# Patient Record
Sex: Female | Born: 1950 | Race: White | Hispanic: No | Marital: Married | Smoking: Current every day smoker
Health system: Southern US, Community
[De-identification: ages and names within clinical notes are randomized; demographics above are authoritative.]

## PROBLEM LIST (undated history)

## (undated) DIAGNOSIS — F419 Anxiety disorder, unspecified: Secondary | ICD-10-CM

## (undated) DIAGNOSIS — R413 Other amnesia: Secondary | ICD-10-CM

## (undated) DIAGNOSIS — I739 Peripheral vascular disease, unspecified: Secondary | ICD-10-CM

## (undated) DIAGNOSIS — E785 Hyperlipidemia, unspecified: Secondary | ICD-10-CM

## (undated) DIAGNOSIS — I6529 Occlusion and stenosis of unspecified carotid artery: Secondary | ICD-10-CM

## (undated) DIAGNOSIS — F319 Bipolar disorder, unspecified: Secondary | ICD-10-CM

## (undated) DIAGNOSIS — I350 Nonrheumatic aortic (valve) stenosis: Secondary | ICD-10-CM

## (undated) DIAGNOSIS — I5030 Unspecified diastolic (congestive) heart failure: Secondary | ICD-10-CM

## (undated) DIAGNOSIS — I2510 Atherosclerotic heart disease of native coronary artery without angina pectoris: Secondary | ICD-10-CM

## (undated) DIAGNOSIS — J9691 Respiratory failure, unspecified with hypoxia: Secondary | ICD-10-CM

## (undated) DIAGNOSIS — Z952 Presence of prosthetic heart valve: Secondary | ICD-10-CM

## (undated) DIAGNOSIS — K551 Chronic vascular disorders of intestine: Secondary | ICD-10-CM

## (undated) DIAGNOSIS — F329 Major depressive disorder, single episode, unspecified: Secondary | ICD-10-CM

## (undated) DIAGNOSIS — K219 Gastro-esophageal reflux disease without esophagitis: Secondary | ICD-10-CM

## (undated) DIAGNOSIS — C801 Malignant (primary) neoplasm, unspecified: Secondary | ICD-10-CM

## (undated) DIAGNOSIS — E119 Type 2 diabetes mellitus without complications: Secondary | ICD-10-CM

## (undated) DIAGNOSIS — I10 Essential (primary) hypertension: Secondary | ICD-10-CM

## (undated) DIAGNOSIS — Z7902 Long term (current) use of antithrombotics/antiplatelets: Secondary | ICD-10-CM

## (undated) DIAGNOSIS — F209 Schizophrenia, unspecified: Secondary | ICD-10-CM

## (undated) DIAGNOSIS — J449 Chronic obstructive pulmonary disease, unspecified: Secondary | ICD-10-CM

## (undated) DIAGNOSIS — M542 Cervicalgia: Secondary | ICD-10-CM

## (undated) DIAGNOSIS — C4491 Basal cell carcinoma of skin, unspecified: Secondary | ICD-10-CM

## (undated) DIAGNOSIS — F32A Depression, unspecified: Secondary | ICD-10-CM

## (undated) DIAGNOSIS — E1142 Type 2 diabetes mellitus with diabetic polyneuropathy: Secondary | ICD-10-CM

## (undated) DIAGNOSIS — I700 Atherosclerosis of aorta: Secondary | ICD-10-CM

## (undated) DIAGNOSIS — I779 Disorder of arteries and arterioles, unspecified: Secondary | ICD-10-CM

## (undated) DIAGNOSIS — I351 Nonrheumatic aortic (valve) insufficiency: Secondary | ICD-10-CM

## (undated) DIAGNOSIS — Z794 Long term (current) use of insulin: Secondary | ICD-10-CM

## (undated) DIAGNOSIS — R42 Dizziness and giddiness: Secondary | ICD-10-CM

## (undated) DIAGNOSIS — I209 Angina pectoris, unspecified: Secondary | ICD-10-CM

## (undated) DIAGNOSIS — M5030 Other cervical disc degeneration, unspecified cervical region: Secondary | ICD-10-CM

## (undated) HISTORY — DX: Atherosclerotic heart disease of native coronary artery without angina pectoris: I25.10

## (undated) HISTORY — PX: BACK SURGERY: SHX140

## (undated) HISTORY — PX: HEMORRHOID SURGERY: SHX153

## (undated) HISTORY — PX: ABDOMINAL HYSTERECTOMY: SHX81

## (undated) HISTORY — PX: FOOT SURGERY: SHX648

## (undated) HISTORY — DX: Schizophrenia, unspecified: F20.9

## (undated) HISTORY — PX: CARDIAC VALVE REPLACEMENT: SHX585

## (undated) HISTORY — DX: Bipolar disorder, unspecified: F31.9

## (undated) HISTORY — PX: CATARACT EXTRACTION, BILATERAL: SHX1313

## (undated) HISTORY — PX: TUBAL LIGATION: SHX77

## (undated) HISTORY — PX: BLADDER SURGERY: SHX569

## (undated) HISTORY — DX: Occlusion and stenosis of unspecified carotid artery: I65.29

## (undated) HISTORY — PX: CHOLECYSTECTOMY: SHX55

## (undated) HISTORY — DX: Nonrheumatic aortic (valve) insufficiency: I35.1

## (undated) HISTORY — DX: Presence of prosthetic heart valve: Z95.2

## (undated) HISTORY — PX: COLONOSCOPY: SHX174

## (undated) HISTORY — DX: Type 2 diabetes mellitus without complications: E11.9

---

## 1990-08-02 DIAGNOSIS — E119 Type 2 diabetes mellitus without complications: Secondary | ICD-10-CM | POA: Insufficient documentation

## 1999-01-04 ENCOUNTER — Other Ambulatory Visit: Admission: RE | Admit: 1999-01-04 | Discharge: 1999-01-04 | Payer: Self-pay | Admitting: Internal Medicine

## 2000-01-24 ENCOUNTER — Encounter: Admission: RE | Admit: 2000-01-24 | Discharge: 2000-01-31 | Payer: Self-pay | Admitting: Family Medicine

## 2006-10-08 ENCOUNTER — Emergency Department: Payer: Self-pay | Admitting: Internal Medicine

## 2007-11-16 ENCOUNTER — Emergency Department: Payer: Self-pay | Admitting: Emergency Medicine

## 2007-11-16 ENCOUNTER — Other Ambulatory Visit: Payer: Self-pay

## 2007-11-23 ENCOUNTER — Ambulatory Visit: Payer: Self-pay | Admitting: Emergency Medicine

## 2008-05-16 ENCOUNTER — Encounter: Admission: RE | Admit: 2008-05-16 | Discharge: 2008-05-16 | Payer: Self-pay | Admitting: Internal Medicine

## 2008-09-22 ENCOUNTER — Ambulatory Visit: Payer: Self-pay | Admitting: Family Medicine

## 2010-11-23 ENCOUNTER — Emergency Department: Payer: Self-pay | Admitting: Emergency Medicine

## 2012-01-30 ENCOUNTER — Encounter (HOSPITAL_COMMUNITY): Payer: Self-pay | Admitting: *Deleted

## 2012-01-30 ENCOUNTER — Emergency Department (HOSPITAL_COMMUNITY): Admission: EM | Admit: 2012-01-30 | Payer: Medicare Other | Source: Home / Self Care

## 2012-01-30 HISTORY — DX: Anxiety disorder, unspecified: F41.9

## 2012-01-30 HISTORY — DX: Dizziness and giddiness: R42

## 2012-01-30 HISTORY — DX: Major depressive disorder, single episode, unspecified: F32.9

## 2012-01-30 HISTORY — DX: Depression, unspecified: F32.A

## 2012-01-30 LAB — GLUCOSE, CAPILLARY: Glucose-Capillary: 110 mg/dL — ABNORMAL HIGH (ref 70–99)

## 2012-01-30 NOTE — ED Notes (Signed)
Pt states she passed out four times yesterday. States everything is still spinning today. Pt states she has hx of vertigo and went to PMD office this morning and was told to come to ED due to low blood pressure. Denies pain at this time.

## 2012-02-14 DIAGNOSIS — I10 Essential (primary) hypertension: Secondary | ICD-10-CM

## 2012-02-24 ENCOUNTER — Ambulatory Visit: Payer: Self-pay

## 2012-03-04 ENCOUNTER — Ambulatory Visit: Payer: Self-pay | Admitting: General Surgery

## 2012-03-09 ENCOUNTER — Ambulatory Visit: Payer: Self-pay | Admitting: General Surgery

## 2012-03-11 LAB — PATHOLOGY REPORT

## 2012-03-15 ENCOUNTER — Ambulatory Visit: Payer: Self-pay | Admitting: Anesthesiology

## 2012-03-18 ENCOUNTER — Ambulatory Visit: Payer: Self-pay | Admitting: General Surgery

## 2012-03-19 LAB — PATHOLOGY REPORT

## 2012-06-20 ENCOUNTER — Emergency Department: Payer: Self-pay | Admitting: *Deleted

## 2012-06-20 LAB — URINALYSIS, COMPLETE
Bacteria: NONE SEEN
Bilirubin,UR: NEGATIVE
Blood: NEGATIVE
Glucose,UR: >=500 mg/dL (ref 0–75)
Hyaline Cast: 1
Ketone: NEGATIVE
Leukocyte Esterase: NEGATIVE
Nitrite: NEGATIVE
Ph: 6 (ref 4.5–8.0)
Protein: NEGATIVE
RBC,UR: 1 /HPF (ref 0–5)
Specific Gravity: 1.022 (ref 1.003–1.030)
Squamous Epithelial: 1
WBC UR: 1 /HPF (ref 0–5)

## 2012-06-20 LAB — COMPREHENSIVE METABOLIC PANEL
Albumin: 3.5 g/dL (ref 3.4–5.0)
Alkaline Phosphatase: 127 U/L (ref 50–136)
Anion Gap: 8 (ref 7–16)
BUN: 20 mg/dL — ABNORMAL HIGH (ref 7–18)
Bilirubin,Total: 0.3 mg/dL (ref 0.2–1.0)
Calcium, Total: 8.8 mg/dL (ref 8.5–10.1)
Chloride: 100 mmol/L (ref 98–107)
Co2: 27 mmol/L (ref 21–32)
Creatinine: 0.6 mg/dL (ref 0.60–1.30)
EGFR (African American): >60
EGFR (Non-African Amer.): >60
Glucose: 250 mg/dL — ABNORMAL HIGH (ref 65–99)
Osmolality: 281 (ref 275–301)
Potassium: 4 mmol/L (ref 3.5–5.1)
SGOT(AST): 17 U/L (ref 15–37)
SGPT (ALT): 27 U/L (ref 12–78)
Sodium: 135 mmol/L — ABNORMAL LOW (ref 136–145)
Total Protein: 6.7 g/dL (ref 6.4–8.2)

## 2012-06-20 LAB — CBC
HCT: 42.9 % (ref 35.0–47.0)
HGB: 14.3 g/dL (ref 12.0–16.0)
MCH: 30.5 pg (ref 26.0–34.0)
MCHC: 33.4 g/dL (ref 32.0–36.0)
MCV: 91 fL (ref 80–100)
Platelet: 357 10*3/uL (ref 150–440)
RBC: 4.71 10*6/uL (ref 3.80–5.20)
RDW: 13.6 % (ref 11.5–14.5)
WBC: 7.6 10*3/uL (ref 3.6–11.0)

## 2012-06-20 LAB — TROPONIN I: Troponin-I: <0.02 ng/mL

## 2012-07-06 ENCOUNTER — Ambulatory Visit: Payer: Self-pay | Admitting: Family Medicine

## 2012-07-08 ENCOUNTER — Ambulatory Visit: Payer: Self-pay | Admitting: Family Medicine

## 2012-08-02 DIAGNOSIS — R5381 Other malaise: Secondary | ICD-10-CM | POA: Insufficient documentation

## 2012-08-02 DIAGNOSIS — I10 Essential (primary) hypertension: Secondary | ICD-10-CM | POA: Insufficient documentation

## 2012-08-02 DIAGNOSIS — R5383 Other fatigue: Secondary | ICD-10-CM | POA: Insufficient documentation

## 2012-08-02 DIAGNOSIS — M542 Cervicalgia: Secondary | ICD-10-CM | POA: Insufficient documentation

## 2012-08-02 DIAGNOSIS — E1142 Type 2 diabetes mellitus with diabetic polyneuropathy: Secondary | ICD-10-CM | POA: Insufficient documentation

## 2012-08-02 DIAGNOSIS — R42 Dizziness and giddiness: Secondary | ICD-10-CM | POA: Insufficient documentation

## 2012-08-02 DIAGNOSIS — I019 Acute rheumatic heart disease, unspecified: Secondary | ICD-10-CM | POA: Insufficient documentation

## 2012-08-02 DIAGNOSIS — F17200 Nicotine dependence, unspecified, uncomplicated: Secondary | ICD-10-CM | POA: Insufficient documentation

## 2012-08-02 DIAGNOSIS — F329 Major depressive disorder, single episode, unspecified: Secondary | ICD-10-CM | POA: Insufficient documentation

## 2012-11-09 DIAGNOSIS — R413 Other amnesia: Secondary | ICD-10-CM | POA: Insufficient documentation

## 2013-04-13 ENCOUNTER — Ambulatory Visit: Payer: Self-pay

## 2014-03-20 DIAGNOSIS — G8929 Other chronic pain: Secondary | ICD-10-CM | POA: Insufficient documentation

## 2014-03-20 DIAGNOSIS — I351 Nonrheumatic aortic (valve) insufficiency: Secondary | ICD-10-CM | POA: Insufficient documentation

## 2014-03-20 DIAGNOSIS — I2510 Atherosclerotic heart disease of native coronary artery without angina pectoris: Secondary | ICD-10-CM | POA: Insufficient documentation

## 2014-03-20 HISTORY — PX: CORONARY ANGIOPLASTY WITH STENT PLACEMENT: SHX49

## 2014-03-20 HISTORY — DX: Atherosclerotic heart disease of native coronary artery without angina pectoris: I25.10

## 2014-06-05 DIAGNOSIS — I739 Peripheral vascular disease, unspecified: Secondary | ICD-10-CM | POA: Insufficient documentation

## 2014-06-28 DIAGNOSIS — I70201 Unspecified atherosclerosis of native arteries of extremities, right leg: Secondary | ICD-10-CM | POA: Insufficient documentation

## 2014-06-30 HISTORY — PX: PSEUDOANEURYSM REPAIR: SHX2272

## 2014-07-08 HISTORY — PX: IRRIGATION AND DEBRIDEMENT HEMATOMA: SHX5254

## 2014-07-19 DIAGNOSIS — I729 Aneurysm of unspecified site: Secondary | ICD-10-CM | POA: Insufficient documentation

## 2014-07-19 DIAGNOSIS — T81718A Complication of other artery following a procedure, not elsewhere classified, initial encounter: Secondary | ICD-10-CM | POA: Insufficient documentation

## 2014-09-26 ENCOUNTER — Ambulatory Visit: Payer: Self-pay | Admitting: Family Medicine

## 2014-12-12 NOTE — Op Note (Signed)
PATIENT NAME:  CAELIN, ROSEN MR#:  [id number] DATE OF BIRTH:  08-11-1951  DATE OF PROCEDURE:  03/18/2012  PREOPERATIVE DIAGNOSIS: Chronic cholecystitis and cholelithiasis.   POSTOPERATIVE DIAGNOSIS: Chronic cholecystitis and cholelithiasis.   OPERATIVE PROCEDURE: Laparoscopic cholecystectomy with intraoperative cholangiograms.   SURGEON: Hervey Ard, MD  ANESTHESIA: General endotracheal.   ESTIMATED BLOOD LOSS: Less than 5 mL.   CLINICAL NOTE: This 64 year old woman had experienced abdominal pain as well as  significant weight loss. Preoperative evaluation with CT and chest x-ray had been unremarkable. Upper and lower endoscopy showed no evidence of malignancy. Ultrasound had shown evidence of cholelithiasis and she was felt to be a candidate for elective cholecystectomy.   OPERATIVE NOTE: With the patient under adequate general endotracheal anesthesia, the abdomen was prepped with ChloraPrep and draped. A Veress needle was placed through a transumbilical incision and after assuring intra-abdominal location with the hanging drop test, the abdomen was insufflated with CO2 at 10 mmHg pressure. A 10 mm step port was expanded. Inspection showed evidence of significant adhesions from the omentum to the lower midline incision. It was possible to navigate the scope to the upper abdomen where an 11 mm Xcel port was placed under direct vision. The scope was then moved to this position and two 5 mm step ports placed in the right lateral abdominal wall. The adhesions were then taken down with sharp and cautery dissection. This allowed exposure of the port. The colon appeared well away from the area of dissection. The camera was returned to the umbilical port site and the patient placed in reverse Trendelenburg position and rolled to the left. The gallbladder showed chronic changes with moderate thickening. The gallbladder was placed on cephalad traction and the neck of the gallbladder cleared. Fluoroscopic  cholangiograms were then completed with one half strength Conray 60. This showed prompt filling of the right and left hepatic ducts, free flow into the common bile duct, and no evidence of retained stones. The cystic duct and branches of the cystic artery were doubly clipped and divided. The gallbladder was then delivered through the umbilical port site. Inspection from the epigastric site showed good hemostasis of the area where the omental adhesions had been taken down, at the umbilicus. The abdomen was irrigated with lactated Ringer's solution. After assuring good hemostasis, the abdomen was then desufflated under direct vision. Skin incisions were closed with 4-0 Vicryl subcuticular sutures. Benzoin, Steri-Strips, Telfa, and Tegaderm dressings were then applied.   The patient tolerated the procedure well and was taken to the recovery room in stable condition. ____________________________ Robert Bellow, MD jwb:slb D: 03/18/2012 20:18:52 ET T: 03/19/2012 10:30:40 ET JOB#: [id number]  cc: Robert Bellow, MD, <Dictator> Guadalupe Maple, MD JEFFREY Amedeo Kinsman MD ELECTRONICALLY SIGNED 03/19/2012 14:37

## 2015-03-16 DIAGNOSIS — Z85828 Personal history of other malignant neoplasm of skin: Secondary | ICD-10-CM | POA: Insufficient documentation

## 2015-07-10 DIAGNOSIS — R002 Palpitations: Secondary | ICD-10-CM | POA: Insufficient documentation

## 2015-09-27 ENCOUNTER — Other Ambulatory Visit: Payer: Self-pay | Admitting: Family Medicine

## 2015-09-27 ENCOUNTER — Ambulatory Visit
Admission: RE | Admit: 2015-09-27 | Discharge: 2015-09-27 | Disposition: A | Discharge status: Home / Self Care | Payer: Medicare HMO | Source: Ambulatory Visit | Attending: Family Medicine | Admitting: Family Medicine

## 2015-09-27 DIAGNOSIS — M79642 Pain in left hand: Secondary | ICD-10-CM

## 2015-10-01 ENCOUNTER — Other Ambulatory Visit: Payer: Self-pay | Admitting: Family Medicine

## 2015-10-01 DIAGNOSIS — Z1231 Encounter for screening mammogram for malignant neoplasm of breast: Secondary | ICD-10-CM

## 2015-10-16 ENCOUNTER — Ambulatory Visit
Admission: RE | Admit: 2015-10-16 | Discharge: 2015-10-16 | Disposition: A | Discharge status: Home / Self Care | Payer: Medicare HMO | Source: Ambulatory Visit | Attending: Family Medicine | Admitting: Family Medicine

## 2015-10-16 ENCOUNTER — Other Ambulatory Visit: Payer: Self-pay | Admitting: Family Medicine

## 2015-10-16 DIAGNOSIS — Z1231 Encounter for screening mammogram for malignant neoplasm of breast: Secondary | ICD-10-CM

## 2015-10-16 HISTORY — DX: Malignant (primary) neoplasm, unspecified: C80.1

## 2016-01-08 DIAGNOSIS — I70219 Atherosclerosis of native arteries of extremities with intermittent claudication, unspecified extremity: Secondary | ICD-10-CM | POA: Insufficient documentation

## 2016-01-08 DIAGNOSIS — I739 Peripheral vascular disease, unspecified: Secondary | ICD-10-CM | POA: Insufficient documentation

## 2016-01-16 HISTORY — PX: CORONARY ANGIOPLASTY: SHX604

## 2016-01-25 HISTORY — PX: CORONARY ANGIOPLASTY WITH STENT PLACEMENT: SHX49

## 2016-07-25 DIAGNOSIS — I214 Non-ST elevation (NSTEMI) myocardial infarction: Secondary | ICD-10-CM

## 2016-07-25 HISTORY — DX: Non-ST elevation (NSTEMI) myocardial infarction: I21.4

## 2016-08-17 DIAGNOSIS — F17210 Nicotine dependence, cigarettes, uncomplicated: Secondary | ICD-10-CM | POA: Diagnosis not present

## 2016-08-17 DIAGNOSIS — E1142 Type 2 diabetes mellitus with diabetic polyneuropathy: Secondary | ICD-10-CM | POA: Diagnosis not present

## 2016-08-17 DIAGNOSIS — R9431 Abnormal electrocardiogram [ECG] [EKG]: Secondary | ICD-10-CM | POA: Diagnosis not present

## 2016-08-17 DIAGNOSIS — I21A1 Myocardial infarction type 2: Secondary | ICD-10-CM | POA: Diagnosis not present

## 2016-08-17 DIAGNOSIS — S2231XA Fracture of one rib, right side, initial encounter for closed fracture: Secondary | ICD-10-CM | POA: Diagnosis not present

## 2016-08-17 DIAGNOSIS — R410 Disorientation, unspecified: Secondary | ICD-10-CM | POA: Diagnosis not present

## 2016-08-17 DIAGNOSIS — K5900 Constipation, unspecified: Secondary | ICD-10-CM | POA: Diagnosis not present

## 2016-08-17 DIAGNOSIS — I351 Nonrheumatic aortic (valve) insufficiency: Secondary | ICD-10-CM | POA: Diagnosis not present

## 2016-08-17 DIAGNOSIS — I350 Nonrheumatic aortic (valve) stenosis: Secondary | ICD-10-CM | POA: Diagnosis not present

## 2016-08-17 DIAGNOSIS — E873 Alkalosis: Secondary | ICD-10-CM | POA: Diagnosis not present

## 2016-08-17 DIAGNOSIS — S79911A Unspecified injury of right hip, initial encounter: Secondary | ICD-10-CM | POA: Diagnosis not present

## 2016-08-17 DIAGNOSIS — J441 Chronic obstructive pulmonary disease with (acute) exacerbation: Secondary | ICD-10-CM | POA: Diagnosis not present

## 2016-08-17 DIAGNOSIS — J09X2 Influenza due to identified novel influenza A virus with other respiratory manifestations: Secondary | ICD-10-CM | POA: Diagnosis not present

## 2016-08-17 DIAGNOSIS — I2510 Atherosclerotic heart disease of native coronary artery without angina pectoris: Secondary | ICD-10-CM | POA: Diagnosis not present

## 2016-08-17 DIAGNOSIS — J90 Pleural effusion, not elsewhere classified: Secondary | ICD-10-CM | POA: Diagnosis not present

## 2016-08-17 DIAGNOSIS — G9349 Other encephalopathy: Secondary | ICD-10-CM | POA: Diagnosis not present

## 2016-08-17 DIAGNOSIS — I4589 Other specified conduction disorders: Secondary | ICD-10-CM | POA: Diagnosis not present

## 2016-08-17 DIAGNOSIS — R413 Other amnesia: Secondary | ICD-10-CM | POA: Diagnosis not present

## 2016-08-17 DIAGNOSIS — J101 Influenza due to other identified influenza virus with other respiratory manifestations: Secondary | ICD-10-CM | POA: Diagnosis not present

## 2016-08-17 DIAGNOSIS — R0682 Tachypnea, not elsewhere classified: Secondary | ICD-10-CM | POA: Diagnosis not present

## 2016-08-17 DIAGNOSIS — R000 Tachycardia, unspecified: Secondary | ICD-10-CM | POA: Diagnosis not present

## 2016-08-17 DIAGNOSIS — E119 Type 2 diabetes mellitus without complications: Secondary | ICD-10-CM | POA: Diagnosis not present

## 2016-08-17 DIAGNOSIS — I517 Cardiomegaly: Secondary | ICD-10-CM | POA: Diagnosis not present

## 2016-08-17 DIAGNOSIS — D649 Anemia, unspecified: Secondary | ICD-10-CM | POA: Diagnosis not present

## 2016-08-17 DIAGNOSIS — R0989 Other specified symptoms and signs involving the circulatory and respiratory systems: Secondary | ICD-10-CM | POA: Diagnosis not present

## 2016-08-17 DIAGNOSIS — S0990XA Unspecified injury of head, initial encounter: Secondary | ICD-10-CM | POA: Diagnosis not present

## 2016-08-17 DIAGNOSIS — I4581 Long QT syndrome: Secondary | ICD-10-CM | POA: Diagnosis not present

## 2016-08-17 DIAGNOSIS — R0600 Dyspnea, unspecified: Secondary | ICD-10-CM | POA: Diagnosis not present

## 2016-08-17 DIAGNOSIS — A4189 Other specified sepsis: Secondary | ICD-10-CM | POA: Diagnosis not present

## 2016-08-17 DIAGNOSIS — Z4682 Encounter for fitting and adjustment of non-vascular catheter: Secondary | ICD-10-CM | POA: Diagnosis not present

## 2016-08-17 DIAGNOSIS — J1100 Influenza due to unidentified influenza virus with unspecified type of pneumonia: Secondary | ICD-10-CM | POA: Diagnosis not present

## 2016-08-17 DIAGNOSIS — I214 Non-ST elevation (NSTEMI) myocardial infarction: Secondary | ICD-10-CM | POA: Diagnosis not present

## 2016-08-17 DIAGNOSIS — J811 Chronic pulmonary edema: Secondary | ICD-10-CM | POA: Diagnosis not present

## 2016-08-17 DIAGNOSIS — Z452 Encounter for adjustment and management of vascular access device: Secondary | ICD-10-CM | POA: Diagnosis not present

## 2016-08-17 DIAGNOSIS — R918 Other nonspecific abnormal finding of lung field: Secondary | ICD-10-CM | POA: Diagnosis not present

## 2016-08-17 DIAGNOSIS — R0602 Shortness of breath: Secondary | ICD-10-CM | POA: Diagnosis not present

## 2016-08-17 DIAGNOSIS — R748 Abnormal levels of other serum enzymes: Secondary | ICD-10-CM | POA: Diagnosis not present

## 2016-08-17 DIAGNOSIS — J9601 Acute respiratory failure with hypoxia: Secondary | ICD-10-CM | POA: Diagnosis not present

## 2016-08-17 DIAGNOSIS — R509 Fever, unspecified: Secondary | ICD-10-CM | POA: Diagnosis not present

## 2016-08-17 DIAGNOSIS — I248 Other forms of acute ischemic heart disease: Secondary | ICD-10-CM | POA: Diagnosis not present

## 2016-08-17 DIAGNOSIS — J111 Influenza due to unidentified influenza virus with other respiratory manifestations: Secondary | ICD-10-CM | POA: Diagnosis not present

## 2016-08-17 DIAGNOSIS — R2689 Other abnormalities of gait and mobility: Secondary | ICD-10-CM | POA: Diagnosis not present

## 2016-08-17 DIAGNOSIS — I10 Essential (primary) hypertension: Secondary | ICD-10-CM | POA: Diagnosis not present

## 2016-08-17 DIAGNOSIS — I878 Other specified disorders of veins: Secondary | ICD-10-CM | POA: Diagnosis not present

## 2016-08-17 DIAGNOSIS — J810 Acute pulmonary edema: Secondary | ICD-10-CM | POA: Diagnosis not present

## 2016-08-17 DIAGNOSIS — R4182 Altered mental status, unspecified: Secondary | ICD-10-CM | POA: Diagnosis not present

## 2016-08-18 DIAGNOSIS — R0602 Shortness of breath: Secondary | ICD-10-CM | POA: Insufficient documentation

## 2016-08-21 DIAGNOSIS — I214 Non-ST elevation (NSTEMI) myocardial infarction: Secondary | ICD-10-CM

## 2016-08-21 HISTORY — DX: Non-ST elevation (NSTEMI) myocardial infarction: I21.4

## 2016-08-25 DIAGNOSIS — Z952 Presence of prosthetic heart valve: Secondary | ICD-10-CM

## 2016-08-25 HISTORY — DX: Presence of prosthetic heart valve: Z95.2

## 2016-09-01 DIAGNOSIS — I214 Non-ST elevation (NSTEMI) myocardial infarction: Secondary | ICD-10-CM | POA: Diagnosis not present

## 2016-09-01 DIAGNOSIS — J441 Chronic obstructive pulmonary disease with (acute) exacerbation: Secondary | ICD-10-CM | POA: Diagnosis not present

## 2016-09-01 DIAGNOSIS — I2510 Atherosclerotic heart disease of native coronary artery without angina pectoris: Secondary | ICD-10-CM | POA: Diagnosis not present

## 2016-09-01 DIAGNOSIS — E1151 Type 2 diabetes mellitus with diabetic peripheral angiopathy without gangrene: Secondary | ICD-10-CM | POA: Diagnosis not present

## 2016-09-01 DIAGNOSIS — I10 Essential (primary) hypertension: Secondary | ICD-10-CM | POA: Diagnosis not present

## 2016-09-01 DIAGNOSIS — F329 Major depressive disorder, single episode, unspecified: Secondary | ICD-10-CM | POA: Diagnosis not present

## 2016-09-02 DIAGNOSIS — J1100 Influenza due to unidentified influenza virus with unspecified type of pneumonia: Secondary | ICD-10-CM | POA: Diagnosis not present

## 2016-09-02 DIAGNOSIS — F200 Paranoid schizophrenia: Secondary | ICD-10-CM | POA: Diagnosis not present

## 2016-09-02 DIAGNOSIS — R7309 Other abnormal glucose: Secondary | ICD-10-CM | POA: Diagnosis not present

## 2016-09-02 DIAGNOSIS — E119 Type 2 diabetes mellitus without complications: Secondary | ICD-10-CM | POA: Diagnosis not present

## 2016-09-04 DIAGNOSIS — I214 Non-ST elevation (NSTEMI) myocardial infarction: Secondary | ICD-10-CM | POA: Diagnosis not present

## 2016-09-04 DIAGNOSIS — I2510 Atherosclerotic heart disease of native coronary artery without angina pectoris: Secondary | ICD-10-CM | POA: Diagnosis not present

## 2016-09-04 DIAGNOSIS — E1151 Type 2 diabetes mellitus with diabetic peripheral angiopathy without gangrene: Secondary | ICD-10-CM | POA: Diagnosis not present

## 2016-09-04 DIAGNOSIS — I10 Essential (primary) hypertension: Secondary | ICD-10-CM | POA: Diagnosis not present

## 2016-09-04 DIAGNOSIS — F329 Major depressive disorder, single episode, unspecified: Secondary | ICD-10-CM | POA: Diagnosis not present

## 2016-09-04 DIAGNOSIS — J441 Chronic obstructive pulmonary disease with (acute) exacerbation: Secondary | ICD-10-CM | POA: Diagnosis not present

## 2016-09-08 DIAGNOSIS — F329 Major depressive disorder, single episode, unspecified: Secondary | ICD-10-CM | POA: Diagnosis not present

## 2016-09-08 DIAGNOSIS — E1151 Type 2 diabetes mellitus with diabetic peripheral angiopathy without gangrene: Secondary | ICD-10-CM | POA: Diagnosis not present

## 2016-09-08 DIAGNOSIS — I2510 Atherosclerotic heart disease of native coronary artery without angina pectoris: Secondary | ICD-10-CM | POA: Diagnosis not present

## 2016-09-08 DIAGNOSIS — I10 Essential (primary) hypertension: Secondary | ICD-10-CM | POA: Diagnosis not present

## 2016-09-08 DIAGNOSIS — J441 Chronic obstructive pulmonary disease with (acute) exacerbation: Secondary | ICD-10-CM | POA: Diagnosis not present

## 2016-09-08 DIAGNOSIS — I214 Non-ST elevation (NSTEMI) myocardial infarction: Secondary | ICD-10-CM | POA: Diagnosis not present

## 2016-09-09 DIAGNOSIS — F329 Major depressive disorder, single episode, unspecified: Secondary | ICD-10-CM | POA: Diagnosis not present

## 2016-09-09 DIAGNOSIS — I214 Non-ST elevation (NSTEMI) myocardial infarction: Secondary | ICD-10-CM | POA: Diagnosis not present

## 2016-09-09 DIAGNOSIS — I10 Essential (primary) hypertension: Secondary | ICD-10-CM | POA: Diagnosis not present

## 2016-09-09 DIAGNOSIS — E1151 Type 2 diabetes mellitus with diabetic peripheral angiopathy without gangrene: Secondary | ICD-10-CM | POA: Diagnosis not present

## 2016-09-09 DIAGNOSIS — J441 Chronic obstructive pulmonary disease with (acute) exacerbation: Secondary | ICD-10-CM | POA: Diagnosis not present

## 2016-09-09 DIAGNOSIS — I2510 Atherosclerotic heart disease of native coronary artery without angina pectoris: Secondary | ICD-10-CM | POA: Diagnosis not present

## 2016-09-15 DIAGNOSIS — F329 Major depressive disorder, single episode, unspecified: Secondary | ICD-10-CM | POA: Diagnosis not present

## 2016-09-15 DIAGNOSIS — E1151 Type 2 diabetes mellitus with diabetic peripheral angiopathy without gangrene: Secondary | ICD-10-CM | POA: Diagnosis not present

## 2016-09-15 DIAGNOSIS — I10 Essential (primary) hypertension: Secondary | ICD-10-CM | POA: Diagnosis not present

## 2016-09-15 DIAGNOSIS — I214 Non-ST elevation (NSTEMI) myocardial infarction: Secondary | ICD-10-CM | POA: Diagnosis not present

## 2016-09-15 DIAGNOSIS — I2510 Atherosclerotic heart disease of native coronary artery without angina pectoris: Secondary | ICD-10-CM | POA: Diagnosis not present

## 2016-09-15 DIAGNOSIS — J441 Chronic obstructive pulmonary disease with (acute) exacerbation: Secondary | ICD-10-CM | POA: Diagnosis not present

## 2016-09-16 DIAGNOSIS — I10 Essential (primary) hypertension: Secondary | ICD-10-CM | POA: Diagnosis not present

## 2016-09-16 DIAGNOSIS — I214 Non-ST elevation (NSTEMI) myocardial infarction: Secondary | ICD-10-CM | POA: Diagnosis not present

## 2016-09-16 DIAGNOSIS — J441 Chronic obstructive pulmonary disease with (acute) exacerbation: Secondary | ICD-10-CM | POA: Diagnosis not present

## 2016-09-16 DIAGNOSIS — E1151 Type 2 diabetes mellitus with diabetic peripheral angiopathy without gangrene: Secondary | ICD-10-CM | POA: Diagnosis not present

## 2016-09-16 DIAGNOSIS — F329 Major depressive disorder, single episode, unspecified: Secondary | ICD-10-CM | POA: Diagnosis not present

## 2016-09-16 DIAGNOSIS — I2510 Atherosclerotic heart disease of native coronary artery without angina pectoris: Secondary | ICD-10-CM | POA: Diagnosis not present

## 2016-09-18 DIAGNOSIS — J441 Chronic obstructive pulmonary disease with (acute) exacerbation: Secondary | ICD-10-CM | POA: Diagnosis not present

## 2016-09-18 DIAGNOSIS — F329 Major depressive disorder, single episode, unspecified: Secondary | ICD-10-CM | POA: Diagnosis not present

## 2016-09-18 DIAGNOSIS — I10 Essential (primary) hypertension: Secondary | ICD-10-CM | POA: Diagnosis not present

## 2016-09-18 DIAGNOSIS — I2510 Atherosclerotic heart disease of native coronary artery without angina pectoris: Secondary | ICD-10-CM | POA: Diagnosis not present

## 2016-09-18 DIAGNOSIS — I214 Non-ST elevation (NSTEMI) myocardial infarction: Secondary | ICD-10-CM | POA: Diagnosis not present

## 2016-09-18 DIAGNOSIS — E1151 Type 2 diabetes mellitus with diabetic peripheral angiopathy without gangrene: Secondary | ICD-10-CM | POA: Diagnosis not present

## 2016-09-20 DIAGNOSIS — I5030 Unspecified diastolic (congestive) heart failure: Secondary | ICD-10-CM | POA: Insufficient documentation

## 2016-09-20 DIAGNOSIS — I700 Atherosclerosis of aorta: Secondary | ICD-10-CM | POA: Diagnosis not present

## 2016-09-20 DIAGNOSIS — I2510 Atherosclerotic heart disease of native coronary artery without angina pectoris: Secondary | ICD-10-CM | POA: Diagnosis not present

## 2016-09-20 DIAGNOSIS — J449 Chronic obstructive pulmonary disease, unspecified: Secondary | ICD-10-CM | POA: Diagnosis not present

## 2016-09-20 DIAGNOSIS — R0600 Dyspnea, unspecified: Secondary | ICD-10-CM | POA: Diagnosis not present

## 2016-09-20 DIAGNOSIS — I5040 Unspecified combined systolic (congestive) and diastolic (congestive) heart failure: Secondary | ICD-10-CM | POA: Diagnosis not present

## 2016-09-20 DIAGNOSIS — I10 Essential (primary) hypertension: Secondary | ICD-10-CM | POA: Diagnosis not present

## 2016-09-20 DIAGNOSIS — Z955 Presence of coronary angioplasty implant and graft: Secondary | ICD-10-CM | POA: Diagnosis not present

## 2016-09-20 DIAGNOSIS — I5043 Acute on chronic combined systolic (congestive) and diastolic (congestive) heart failure: Secondary | ICD-10-CM | POA: Diagnosis not present

## 2016-09-20 DIAGNOSIS — J810 Acute pulmonary edema: Secondary | ICD-10-CM | POA: Diagnosis not present

## 2016-09-20 DIAGNOSIS — I351 Nonrheumatic aortic (valve) insufficiency: Secondary | ICD-10-CM | POA: Diagnosis not present

## 2016-09-20 DIAGNOSIS — I5041 Acute combined systolic (congestive) and diastolic (congestive) heart failure: Secondary | ICD-10-CM | POA: Diagnosis not present

## 2016-09-20 DIAGNOSIS — F17210 Nicotine dependence, cigarettes, uncomplicated: Secondary | ICD-10-CM | POA: Diagnosis not present

## 2016-09-20 DIAGNOSIS — E785 Hyperlipidemia, unspecified: Secondary | ICD-10-CM | POA: Diagnosis not present

## 2016-09-20 DIAGNOSIS — J811 Chronic pulmonary edema: Secondary | ICD-10-CM | POA: Diagnosis not present

## 2016-09-20 DIAGNOSIS — J9622 Acute and chronic respiratory failure with hypercapnia: Secondary | ICD-10-CM | POA: Diagnosis not present

## 2016-09-20 DIAGNOSIS — E872 Acidosis: Secondary | ICD-10-CM | POA: Diagnosis not present

## 2016-09-20 DIAGNOSIS — I509 Heart failure, unspecified: Secondary | ICD-10-CM | POA: Diagnosis not present

## 2016-09-20 DIAGNOSIS — I739 Peripheral vascular disease, unspecified: Secondary | ICD-10-CM | POA: Diagnosis not present

## 2016-09-20 DIAGNOSIS — R9341 Abnormal radiologic findings on diagnostic imaging of renal pelvis, ureter, or bladder: Secondary | ICD-10-CM | POA: Diagnosis not present

## 2016-09-20 DIAGNOSIS — I350 Nonrheumatic aortic (valve) stenosis: Secondary | ICD-10-CM | POA: Diagnosis not present

## 2016-09-20 DIAGNOSIS — R918 Other nonspecific abnormal finding of lung field: Secondary | ICD-10-CM | POA: Diagnosis not present

## 2016-09-20 DIAGNOSIS — I110 Hypertensive heart disease with heart failure: Secondary | ICD-10-CM | POA: Diagnosis not present

## 2016-09-20 DIAGNOSIS — J9691 Respiratory failure, unspecified with hypoxia: Secondary | ICD-10-CM | POA: Diagnosis not present

## 2016-09-20 DIAGNOSIS — Z01818 Encounter for other preprocedural examination: Secondary | ICD-10-CM | POA: Diagnosis not present

## 2016-09-20 DIAGNOSIS — I352 Nonrheumatic aortic (valve) stenosis with insufficiency: Secondary | ICD-10-CM | POA: Diagnosis not present

## 2016-09-20 DIAGNOSIS — E11649 Type 2 diabetes mellitus with hypoglycemia without coma: Secondary | ICD-10-CM | POA: Diagnosis not present

## 2016-09-20 DIAGNOSIS — J9620 Acute and chronic respiratory failure, unspecified whether with hypoxia or hypercapnia: Secondary | ICD-10-CM | POA: Diagnosis not present

## 2016-09-20 DIAGNOSIS — J90 Pleural effusion, not elsewhere classified: Secondary | ICD-10-CM | POA: Diagnosis not present

## 2016-09-20 DIAGNOSIS — I701 Atherosclerosis of renal artery: Secondary | ICD-10-CM | POA: Diagnosis not present

## 2016-09-20 DIAGNOSIS — T82855A Stenosis of coronary artery stent, initial encounter: Secondary | ICD-10-CM | POA: Diagnosis not present

## 2016-09-20 DIAGNOSIS — E1142 Type 2 diabetes mellitus with diabetic polyneuropathy: Secondary | ICD-10-CM | POA: Diagnosis not present

## 2016-09-20 DIAGNOSIS — R0609 Other forms of dyspnea: Secondary | ICD-10-CM | POA: Diagnosis not present

## 2016-09-20 DIAGNOSIS — E119 Type 2 diabetes mellitus without complications: Secondary | ICD-10-CM | POA: Diagnosis not present

## 2016-09-24 HISTORY — PX: CARDIAC CATHETERIZATION: SHX172

## 2016-09-27 DIAGNOSIS — R0602 Shortness of breath: Secondary | ICD-10-CM | POA: Diagnosis not present

## 2016-09-27 DIAGNOSIS — I2510 Atherosclerotic heart disease of native coronary artery without angina pectoris: Secondary | ICD-10-CM | POA: Diagnosis not present

## 2016-09-29 HISTORY — PX: RIGHT/LEFT HEART CATH AND CORONARY ANGIOGRAPHY: CATH118266

## 2016-09-30 DIAGNOSIS — Z01818 Encounter for other preprocedural examination: Secondary | ICD-10-CM | POA: Diagnosis not present

## 2016-09-30 DIAGNOSIS — I352 Nonrheumatic aortic (valve) stenosis with insufficiency: Secondary | ICD-10-CM | POA: Diagnosis not present

## 2016-09-30 DIAGNOSIS — J449 Chronic obstructive pulmonary disease, unspecified: Secondary | ICD-10-CM | POA: Diagnosis not present

## 2016-09-30 DIAGNOSIS — D638 Anemia in other chronic diseases classified elsewhere: Secondary | ICD-10-CM | POA: Diagnosis not present

## 2016-09-30 DIAGNOSIS — E1165 Type 2 diabetes mellitus with hyperglycemia: Secondary | ICD-10-CM | POA: Diagnosis not present

## 2016-09-30 DIAGNOSIS — Z794 Long term (current) use of insulin: Secondary | ICD-10-CM | POA: Diagnosis not present

## 2016-09-30 DIAGNOSIS — D62 Acute posthemorrhagic anemia: Secondary | ICD-10-CM | POA: Diagnosis not present

## 2016-09-30 DIAGNOSIS — Z952 Presence of prosthetic heart valve: Secondary | ICD-10-CM | POA: Diagnosis not present

## 2016-09-30 DIAGNOSIS — I5021 Acute systolic (congestive) heart failure: Secondary | ICD-10-CM | POA: Diagnosis not present

## 2016-09-30 DIAGNOSIS — I350 Nonrheumatic aortic (valve) stenosis: Secondary | ICD-10-CM | POA: Insufficient documentation

## 2016-09-30 DIAGNOSIS — E878 Other disorders of electrolyte and fluid balance, not elsewhere classified: Secondary | ICD-10-CM | POA: Diagnosis not present

## 2016-09-30 DIAGNOSIS — J951 Acute pulmonary insufficiency following thoracic surgery: Secondary | ICD-10-CM | POA: Diagnosis not present

## 2016-09-30 DIAGNOSIS — Z9889 Other specified postprocedural states: Secondary | ICD-10-CM | POA: Diagnosis not present

## 2016-09-30 DIAGNOSIS — R001 Bradycardia, unspecified: Secondary | ICD-10-CM | POA: Diagnosis not present

## 2016-09-30 DIAGNOSIS — E785 Hyperlipidemia, unspecified: Secondary | ICD-10-CM | POA: Diagnosis not present

## 2016-09-30 DIAGNOSIS — J90 Pleural effusion, not elsewhere classified: Secondary | ICD-10-CM | POA: Diagnosis not present

## 2016-09-30 DIAGNOSIS — I10 Essential (primary) hypertension: Secondary | ICD-10-CM | POA: Diagnosis not present

## 2016-09-30 DIAGNOSIS — I517 Cardiomegaly: Secondary | ICD-10-CM | POA: Diagnosis not present

## 2016-09-30 DIAGNOSIS — Z006 Encounter for examination for normal comparison and control in clinical research program: Secondary | ICD-10-CM | POA: Diagnosis not present

## 2016-09-30 DIAGNOSIS — I214 Non-ST elevation (NSTEMI) myocardial infarction: Secondary | ICD-10-CM | POA: Diagnosis not present

## 2016-09-30 DIAGNOSIS — R9431 Abnormal electrocardiogram [ECG] [EKG]: Secondary | ICD-10-CM | POA: Diagnosis not present

## 2016-09-30 DIAGNOSIS — I2510 Atherosclerotic heart disease of native coronary artery without angina pectoris: Secondary | ICD-10-CM | POA: Diagnosis not present

## 2016-09-30 DIAGNOSIS — I959 Hypotension, unspecified: Secondary | ICD-10-CM | POA: Diagnosis not present

## 2016-09-30 DIAGNOSIS — Z720 Tobacco use: Secondary | ICD-10-CM | POA: Diagnosis not present

## 2016-09-30 DIAGNOSIS — I110 Hypertensive heart disease with heart failure: Secondary | ICD-10-CM | POA: Diagnosis not present

## 2016-09-30 DIAGNOSIS — I739 Peripheral vascular disease, unspecified: Secondary | ICD-10-CM | POA: Diagnosis not present

## 2016-09-30 DIAGNOSIS — E872 Acidosis: Secondary | ICD-10-CM | POA: Diagnosis not present

## 2016-09-30 DIAGNOSIS — G8918 Other acute postprocedural pain: Secondary | ICD-10-CM | POA: Diagnosis not present

## 2016-09-30 DIAGNOSIS — I4581 Long QT syndrome: Secondary | ICD-10-CM | POA: Diagnosis not present

## 2016-09-30 HISTORY — PX: AORTIC VALVE REPLACEMENT: SHX41

## 2016-09-30 HISTORY — DX: Presence of prosthetic heart valve: Z95.2

## 2016-10-05 DIAGNOSIS — Z5189 Encounter for other specified aftercare: Secondary | ICD-10-CM | POA: Diagnosis not present

## 2016-10-05 DIAGNOSIS — L539 Erythematous condition, unspecified: Secondary | ICD-10-CM | POA: Diagnosis not present

## 2016-10-05 DIAGNOSIS — Z794 Long term (current) use of insulin: Secondary | ICD-10-CM | POA: Diagnosis not present

## 2016-10-05 DIAGNOSIS — I351 Nonrheumatic aortic (valve) insufficiency: Secondary | ICD-10-CM | POA: Diagnosis not present

## 2016-10-05 DIAGNOSIS — J449 Chronic obstructive pulmonary disease, unspecified: Secondary | ICD-10-CM | POA: Diagnosis not present

## 2016-10-05 DIAGNOSIS — F419 Anxiety disorder, unspecified: Secondary | ICD-10-CM | POA: Diagnosis not present

## 2016-10-05 DIAGNOSIS — Z48812 Encounter for surgical aftercare following surgery on the circulatory system: Secondary | ICD-10-CM | POA: Diagnosis not present

## 2016-10-05 DIAGNOSIS — I350 Nonrheumatic aortic (valve) stenosis: Secondary | ICD-10-CM | POA: Diagnosis not present

## 2016-10-05 DIAGNOSIS — I2510 Atherosclerotic heart disease of native coronary artery without angina pectoris: Secondary | ICD-10-CM | POA: Diagnosis not present

## 2016-10-05 DIAGNOSIS — Z720 Tobacco use: Secondary | ICD-10-CM | POA: Diagnosis not present

## 2016-10-05 DIAGNOSIS — Z952 Presence of prosthetic heart valve: Secondary | ICD-10-CM | POA: Diagnosis not present

## 2016-10-05 DIAGNOSIS — R222 Localized swelling, mass and lump, trunk: Secondary | ICD-10-CM | POA: Diagnosis not present

## 2016-10-05 DIAGNOSIS — Z7982 Long term (current) use of aspirin: Secondary | ICD-10-CM | POA: Diagnosis not present

## 2016-10-08 DIAGNOSIS — I10 Essential (primary) hypertension: Secondary | ICD-10-CM | POA: Diagnosis not present

## 2016-10-08 DIAGNOSIS — F329 Major depressive disorder, single episode, unspecified: Secondary | ICD-10-CM | POA: Diagnosis not present

## 2016-10-08 DIAGNOSIS — J441 Chronic obstructive pulmonary disease with (acute) exacerbation: Secondary | ICD-10-CM | POA: Diagnosis not present

## 2016-10-08 DIAGNOSIS — E1151 Type 2 diabetes mellitus with diabetic peripheral angiopathy without gangrene: Secondary | ICD-10-CM | POA: Diagnosis not present

## 2016-10-08 DIAGNOSIS — I2510 Atherosclerotic heart disease of native coronary artery without angina pectoris: Secondary | ICD-10-CM | POA: Diagnosis not present

## 2016-10-08 DIAGNOSIS — I214 Non-ST elevation (NSTEMI) myocardial infarction: Secondary | ICD-10-CM | POA: Diagnosis not present

## 2016-10-13 DIAGNOSIS — I10 Essential (primary) hypertension: Secondary | ICD-10-CM | POA: Diagnosis not present

## 2016-10-13 DIAGNOSIS — J441 Chronic obstructive pulmonary disease with (acute) exacerbation: Secondary | ICD-10-CM | POA: Diagnosis not present

## 2016-10-13 DIAGNOSIS — F329 Major depressive disorder, single episode, unspecified: Secondary | ICD-10-CM | POA: Diagnosis not present

## 2016-10-13 DIAGNOSIS — I214 Non-ST elevation (NSTEMI) myocardial infarction: Secondary | ICD-10-CM | POA: Diagnosis not present

## 2016-10-13 DIAGNOSIS — E1151 Type 2 diabetes mellitus with diabetic peripheral angiopathy without gangrene: Secondary | ICD-10-CM | POA: Diagnosis not present

## 2016-10-13 DIAGNOSIS — I2510 Atherosclerotic heart disease of native coronary artery without angina pectoris: Secondary | ICD-10-CM | POA: Diagnosis not present

## 2016-10-14 DIAGNOSIS — I351 Nonrheumatic aortic (valve) insufficiency: Secondary | ICD-10-CM | POA: Diagnosis not present

## 2016-10-14 DIAGNOSIS — F329 Major depressive disorder, single episode, unspecified: Secondary | ICD-10-CM | POA: Diagnosis not present

## 2016-10-14 DIAGNOSIS — J441 Chronic obstructive pulmonary disease with (acute) exacerbation: Secondary | ICD-10-CM | POA: Diagnosis not present

## 2016-10-14 DIAGNOSIS — E1151 Type 2 diabetes mellitus with diabetic peripheral angiopathy without gangrene: Secondary | ICD-10-CM | POA: Diagnosis not present

## 2016-10-14 DIAGNOSIS — I10 Essential (primary) hypertension: Secondary | ICD-10-CM | POA: Diagnosis not present

## 2016-10-14 DIAGNOSIS — I2510 Atherosclerotic heart disease of native coronary artery without angina pectoris: Secondary | ICD-10-CM | POA: Diagnosis not present

## 2016-10-14 DIAGNOSIS — I214 Non-ST elevation (NSTEMI) myocardial infarction: Secondary | ICD-10-CM | POA: Diagnosis not present

## 2016-10-16 DIAGNOSIS — I2510 Atherosclerotic heart disease of native coronary artery without angina pectoris: Secondary | ICD-10-CM | POA: Diagnosis not present

## 2016-10-16 DIAGNOSIS — E1151 Type 2 diabetes mellitus with diabetic peripheral angiopathy without gangrene: Secondary | ICD-10-CM | POA: Diagnosis not present

## 2016-10-16 DIAGNOSIS — I214 Non-ST elevation (NSTEMI) myocardial infarction: Secondary | ICD-10-CM | POA: Diagnosis not present

## 2016-10-16 DIAGNOSIS — J441 Chronic obstructive pulmonary disease with (acute) exacerbation: Secondary | ICD-10-CM | POA: Diagnosis not present

## 2016-10-16 DIAGNOSIS — F329 Major depressive disorder, single episode, unspecified: Secondary | ICD-10-CM | POA: Diagnosis not present

## 2016-10-16 DIAGNOSIS — I10 Essential (primary) hypertension: Secondary | ICD-10-CM | POA: Diagnosis not present

## 2016-10-17 DIAGNOSIS — I10 Essential (primary) hypertension: Secondary | ICD-10-CM | POA: Diagnosis not present

## 2016-10-17 DIAGNOSIS — I2510 Atherosclerotic heart disease of native coronary artery without angina pectoris: Secondary | ICD-10-CM | POA: Diagnosis not present

## 2016-10-17 DIAGNOSIS — J441 Chronic obstructive pulmonary disease with (acute) exacerbation: Secondary | ICD-10-CM | POA: Diagnosis not present

## 2016-10-17 DIAGNOSIS — F329 Major depressive disorder, single episode, unspecified: Secondary | ICD-10-CM | POA: Diagnosis not present

## 2016-10-17 DIAGNOSIS — I214 Non-ST elevation (NSTEMI) myocardial infarction: Secondary | ICD-10-CM | POA: Diagnosis not present

## 2016-10-17 DIAGNOSIS — E1151 Type 2 diabetes mellitus with diabetic peripheral angiopathy without gangrene: Secondary | ICD-10-CM | POA: Diagnosis not present

## 2016-10-21 DIAGNOSIS — I10 Essential (primary) hypertension: Secondary | ICD-10-CM | POA: Diagnosis not present

## 2016-10-21 DIAGNOSIS — J441 Chronic obstructive pulmonary disease with (acute) exacerbation: Secondary | ICD-10-CM | POA: Diagnosis not present

## 2016-10-21 DIAGNOSIS — F329 Major depressive disorder, single episode, unspecified: Secondary | ICD-10-CM | POA: Diagnosis not present

## 2016-10-21 DIAGNOSIS — I2510 Atherosclerotic heart disease of native coronary artery without angina pectoris: Secondary | ICD-10-CM | POA: Diagnosis not present

## 2016-10-21 DIAGNOSIS — I214 Non-ST elevation (NSTEMI) myocardial infarction: Secondary | ICD-10-CM | POA: Diagnosis not present

## 2016-10-21 DIAGNOSIS — E1151 Type 2 diabetes mellitus with diabetic peripheral angiopathy without gangrene: Secondary | ICD-10-CM | POA: Diagnosis not present

## 2016-10-22 DIAGNOSIS — I10 Essential (primary) hypertension: Secondary | ICD-10-CM | POA: Diagnosis not present

## 2016-10-22 DIAGNOSIS — I351 Nonrheumatic aortic (valve) insufficiency: Secondary | ICD-10-CM | POA: Diagnosis not present

## 2016-10-22 DIAGNOSIS — Z952 Presence of prosthetic heart valve: Secondary | ICD-10-CM | POA: Diagnosis not present

## 2016-10-22 DIAGNOSIS — R413 Other amnesia: Secondary | ICD-10-CM | POA: Diagnosis not present

## 2016-10-22 DIAGNOSIS — Z9181 History of falling: Secondary | ICD-10-CM | POA: Diagnosis not present

## 2016-10-23 DIAGNOSIS — E1151 Type 2 diabetes mellitus with diabetic peripheral angiopathy without gangrene: Secondary | ICD-10-CM | POA: Diagnosis not present

## 2016-10-23 DIAGNOSIS — I214 Non-ST elevation (NSTEMI) myocardial infarction: Secondary | ICD-10-CM | POA: Diagnosis not present

## 2016-10-23 DIAGNOSIS — J441 Chronic obstructive pulmonary disease with (acute) exacerbation: Secondary | ICD-10-CM | POA: Diagnosis not present

## 2016-10-23 DIAGNOSIS — F329 Major depressive disorder, single episode, unspecified: Secondary | ICD-10-CM | POA: Diagnosis not present

## 2016-10-23 DIAGNOSIS — I10 Essential (primary) hypertension: Secondary | ICD-10-CM | POA: Diagnosis not present

## 2016-10-23 DIAGNOSIS — I2510 Atherosclerotic heart disease of native coronary artery without angina pectoris: Secondary | ICD-10-CM | POA: Diagnosis not present

## 2016-10-25 DIAGNOSIS — R0602 Shortness of breath: Secondary | ICD-10-CM | POA: Diagnosis not present

## 2016-10-25 DIAGNOSIS — I2510 Atherosclerotic heart disease of native coronary artery without angina pectoris: Secondary | ICD-10-CM | POA: Diagnosis not present

## 2016-10-28 DIAGNOSIS — F329 Major depressive disorder, single episode, unspecified: Secondary | ICD-10-CM | POA: Diagnosis not present

## 2016-10-28 DIAGNOSIS — J441 Chronic obstructive pulmonary disease with (acute) exacerbation: Secondary | ICD-10-CM | POA: Diagnosis not present

## 2016-10-28 DIAGNOSIS — I10 Essential (primary) hypertension: Secondary | ICD-10-CM | POA: Diagnosis not present

## 2016-10-28 DIAGNOSIS — I2510 Atherosclerotic heart disease of native coronary artery without angina pectoris: Secondary | ICD-10-CM | POA: Diagnosis not present

## 2016-10-28 DIAGNOSIS — I214 Non-ST elevation (NSTEMI) myocardial infarction: Secondary | ICD-10-CM | POA: Diagnosis not present

## 2016-10-28 DIAGNOSIS — E1151 Type 2 diabetes mellitus with diabetic peripheral angiopathy without gangrene: Secondary | ICD-10-CM | POA: Diagnosis not present

## 2016-10-29 DIAGNOSIS — I351 Nonrheumatic aortic (valve) insufficiency: Secondary | ICD-10-CM | POA: Diagnosis not present

## 2016-10-29 DIAGNOSIS — Z952 Presence of prosthetic heart valve: Secondary | ICD-10-CM | POA: Insufficient documentation

## 2016-10-29 DIAGNOSIS — I2510 Atherosclerotic heart disease of native coronary artery without angina pectoris: Secondary | ICD-10-CM | POA: Diagnosis not present

## 2016-10-29 DIAGNOSIS — I10 Essential (primary) hypertension: Secondary | ICD-10-CM | POA: Diagnosis not present

## 2016-10-29 DIAGNOSIS — I340 Nonrheumatic mitral (valve) insufficiency: Secondary | ICD-10-CM | POA: Diagnosis not present

## 2016-10-29 DIAGNOSIS — I739 Peripheral vascular disease, unspecified: Secondary | ICD-10-CM | POA: Diagnosis not present

## 2016-10-29 DIAGNOSIS — E1142 Type 2 diabetes mellitus with diabetic polyneuropathy: Secondary | ICD-10-CM | POA: Diagnosis not present

## 2016-10-29 DIAGNOSIS — F17200 Nicotine dependence, unspecified, uncomplicated: Secondary | ICD-10-CM | POA: Diagnosis not present

## 2016-10-29 DIAGNOSIS — I080 Rheumatic disorders of both mitral and aortic valves: Secondary | ICD-10-CM | POA: Diagnosis not present

## 2016-10-29 DIAGNOSIS — I348 Other nonrheumatic mitral valve disorders: Secondary | ICD-10-CM | POA: Diagnosis not present

## 2016-10-29 DIAGNOSIS — I5189 Other ill-defined heart diseases: Secondary | ICD-10-CM | POA: Diagnosis not present

## 2016-10-29 DIAGNOSIS — I517 Cardiomegaly: Secondary | ICD-10-CM | POA: Diagnosis not present

## 2016-10-29 DIAGNOSIS — I110 Hypertensive heart disease with heart failure: Secondary | ICD-10-CM | POA: Diagnosis not present

## 2016-10-29 DIAGNOSIS — I5030 Unspecified diastolic (congestive) heart failure: Secondary | ICD-10-CM | POA: Diagnosis not present

## 2016-10-30 DIAGNOSIS — F329 Major depressive disorder, single episode, unspecified: Secondary | ICD-10-CM | POA: Diagnosis not present

## 2016-10-30 DIAGNOSIS — I10 Essential (primary) hypertension: Secondary | ICD-10-CM | POA: Diagnosis not present

## 2016-10-30 DIAGNOSIS — J441 Chronic obstructive pulmonary disease with (acute) exacerbation: Secondary | ICD-10-CM | POA: Diagnosis not present

## 2016-10-30 DIAGNOSIS — I2510 Atherosclerotic heart disease of native coronary artery without angina pectoris: Secondary | ICD-10-CM | POA: Diagnosis not present

## 2016-10-30 DIAGNOSIS — I214 Non-ST elevation (NSTEMI) myocardial infarction: Secondary | ICD-10-CM | POA: Diagnosis not present

## 2016-10-30 DIAGNOSIS — E1151 Type 2 diabetes mellitus with diabetic peripheral angiopathy without gangrene: Secondary | ICD-10-CM | POA: Diagnosis not present

## 2016-11-04 DIAGNOSIS — I2510 Atherosclerotic heart disease of native coronary artery without angina pectoris: Secondary | ICD-10-CM | POA: Diagnosis not present

## 2016-11-04 DIAGNOSIS — J449 Chronic obstructive pulmonary disease, unspecified: Secondary | ICD-10-CM | POA: Diagnosis not present

## 2016-11-04 DIAGNOSIS — I110 Hypertensive heart disease with heart failure: Secondary | ICD-10-CM | POA: Diagnosis not present

## 2016-11-04 DIAGNOSIS — Z48812 Encounter for surgical aftercare following surgery on the circulatory system: Secondary | ICD-10-CM | POA: Diagnosis not present

## 2016-11-04 DIAGNOSIS — I5040 Unspecified combined systolic (congestive) and diastolic (congestive) heart failure: Secondary | ICD-10-CM | POA: Diagnosis not present

## 2016-11-04 DIAGNOSIS — E1151 Type 2 diabetes mellitus with diabetic peripheral angiopathy without gangrene: Secondary | ICD-10-CM | POA: Diagnosis not present

## 2016-11-05 DIAGNOSIS — I110 Hypertensive heart disease with heart failure: Secondary | ICD-10-CM | POA: Diagnosis not present

## 2016-11-05 DIAGNOSIS — E1151 Type 2 diabetes mellitus with diabetic peripheral angiopathy without gangrene: Secondary | ICD-10-CM | POA: Diagnosis not present

## 2016-11-05 DIAGNOSIS — J449 Chronic obstructive pulmonary disease, unspecified: Secondary | ICD-10-CM | POA: Diagnosis not present

## 2016-11-05 DIAGNOSIS — I5040 Unspecified combined systolic (congestive) and diastolic (congestive) heart failure: Secondary | ICD-10-CM | POA: Diagnosis not present

## 2016-11-05 DIAGNOSIS — I2510 Atherosclerotic heart disease of native coronary artery without angina pectoris: Secondary | ICD-10-CM | POA: Diagnosis not present

## 2016-11-05 DIAGNOSIS — Z48812 Encounter for surgical aftercare following surgery on the circulatory system: Secondary | ICD-10-CM | POA: Diagnosis not present

## 2016-11-06 DIAGNOSIS — J449 Chronic obstructive pulmonary disease, unspecified: Secondary | ICD-10-CM | POA: Diagnosis not present

## 2016-11-06 DIAGNOSIS — I5040 Unspecified combined systolic (congestive) and diastolic (congestive) heart failure: Secondary | ICD-10-CM | POA: Diagnosis not present

## 2016-11-06 DIAGNOSIS — I110 Hypertensive heart disease with heart failure: Secondary | ICD-10-CM | POA: Diagnosis not present

## 2016-11-06 DIAGNOSIS — I2510 Atherosclerotic heart disease of native coronary artery without angina pectoris: Secondary | ICD-10-CM | POA: Diagnosis not present

## 2016-11-06 DIAGNOSIS — Z48812 Encounter for surgical aftercare following surgery on the circulatory system: Secondary | ICD-10-CM | POA: Diagnosis not present

## 2016-11-06 DIAGNOSIS — E1151 Type 2 diabetes mellitus with diabetic peripheral angiopathy without gangrene: Secondary | ICD-10-CM | POA: Diagnosis not present

## 2016-11-10 DIAGNOSIS — I2510 Atherosclerotic heart disease of native coronary artery without angina pectoris: Secondary | ICD-10-CM | POA: Diagnosis not present

## 2016-11-10 DIAGNOSIS — Z48812 Encounter for surgical aftercare following surgery on the circulatory system: Secondary | ICD-10-CM | POA: Diagnosis not present

## 2016-11-10 DIAGNOSIS — I5040 Unspecified combined systolic (congestive) and diastolic (congestive) heart failure: Secondary | ICD-10-CM | POA: Diagnosis not present

## 2016-11-10 DIAGNOSIS — E1151 Type 2 diabetes mellitus with diabetic peripheral angiopathy without gangrene: Secondary | ICD-10-CM | POA: Diagnosis not present

## 2016-11-10 DIAGNOSIS — I110 Hypertensive heart disease with heart failure: Secondary | ICD-10-CM | POA: Diagnosis not present

## 2016-11-10 DIAGNOSIS — J449 Chronic obstructive pulmonary disease, unspecified: Secondary | ICD-10-CM | POA: Diagnosis not present

## 2016-11-12 DIAGNOSIS — Z48812 Encounter for surgical aftercare following surgery on the circulatory system: Secondary | ICD-10-CM | POA: Diagnosis not present

## 2016-11-12 DIAGNOSIS — I2510 Atherosclerotic heart disease of native coronary artery without angina pectoris: Secondary | ICD-10-CM | POA: Diagnosis not present

## 2016-11-12 DIAGNOSIS — I110 Hypertensive heart disease with heart failure: Secondary | ICD-10-CM | POA: Diagnosis not present

## 2016-11-12 DIAGNOSIS — I5040 Unspecified combined systolic (congestive) and diastolic (congestive) heart failure: Secondary | ICD-10-CM | POA: Diagnosis not present

## 2016-11-12 DIAGNOSIS — J449 Chronic obstructive pulmonary disease, unspecified: Secondary | ICD-10-CM | POA: Diagnosis not present

## 2016-11-12 DIAGNOSIS — E1151 Type 2 diabetes mellitus with diabetic peripheral angiopathy without gangrene: Secondary | ICD-10-CM | POA: Diagnosis not present

## 2016-11-14 DIAGNOSIS — J449 Chronic obstructive pulmonary disease, unspecified: Secondary | ICD-10-CM | POA: Diagnosis not present

## 2016-11-14 DIAGNOSIS — I110 Hypertensive heart disease with heart failure: Secondary | ICD-10-CM | POA: Diagnosis not present

## 2016-11-14 DIAGNOSIS — Z48812 Encounter for surgical aftercare following surgery on the circulatory system: Secondary | ICD-10-CM | POA: Diagnosis not present

## 2016-11-14 DIAGNOSIS — E1151 Type 2 diabetes mellitus with diabetic peripheral angiopathy without gangrene: Secondary | ICD-10-CM | POA: Diagnosis not present

## 2016-11-14 DIAGNOSIS — I2510 Atherosclerotic heart disease of native coronary artery without angina pectoris: Secondary | ICD-10-CM | POA: Diagnosis not present

## 2016-11-14 DIAGNOSIS — I5040 Unspecified combined systolic (congestive) and diastolic (congestive) heart failure: Secondary | ICD-10-CM | POA: Diagnosis not present

## 2016-11-17 DIAGNOSIS — I110 Hypertensive heart disease with heart failure: Secondary | ICD-10-CM | POA: Diagnosis not present

## 2016-11-17 DIAGNOSIS — Z48812 Encounter for surgical aftercare following surgery on the circulatory system: Secondary | ICD-10-CM | POA: Diagnosis not present

## 2016-11-17 DIAGNOSIS — I5040 Unspecified combined systolic (congestive) and diastolic (congestive) heart failure: Secondary | ICD-10-CM | POA: Diagnosis not present

## 2016-11-17 DIAGNOSIS — E1151 Type 2 diabetes mellitus with diabetic peripheral angiopathy without gangrene: Secondary | ICD-10-CM | POA: Diagnosis not present

## 2016-11-17 DIAGNOSIS — J449 Chronic obstructive pulmonary disease, unspecified: Secondary | ICD-10-CM | POA: Diagnosis not present

## 2016-11-17 DIAGNOSIS — I2510 Atherosclerotic heart disease of native coronary artery without angina pectoris: Secondary | ICD-10-CM | POA: Diagnosis not present

## 2016-11-18 DIAGNOSIS — Z48812 Encounter for surgical aftercare following surgery on the circulatory system: Secondary | ICD-10-CM | POA: Diagnosis not present

## 2016-11-18 DIAGNOSIS — I2510 Atherosclerotic heart disease of native coronary artery without angina pectoris: Secondary | ICD-10-CM | POA: Diagnosis not present

## 2016-11-18 DIAGNOSIS — E1151 Type 2 diabetes mellitus with diabetic peripheral angiopathy without gangrene: Secondary | ICD-10-CM | POA: Diagnosis not present

## 2016-11-18 DIAGNOSIS — I110 Hypertensive heart disease with heart failure: Secondary | ICD-10-CM | POA: Diagnosis not present

## 2016-11-18 DIAGNOSIS — J449 Chronic obstructive pulmonary disease, unspecified: Secondary | ICD-10-CM | POA: Diagnosis not present

## 2016-11-18 DIAGNOSIS — I5040 Unspecified combined systolic (congestive) and diastolic (congestive) heart failure: Secondary | ICD-10-CM | POA: Diagnosis not present

## 2016-11-19 DIAGNOSIS — I5040 Unspecified combined systolic (congestive) and diastolic (congestive) heart failure: Secondary | ICD-10-CM | POA: Diagnosis not present

## 2016-11-19 DIAGNOSIS — I2510 Atherosclerotic heart disease of native coronary artery without angina pectoris: Secondary | ICD-10-CM | POA: Diagnosis not present

## 2016-11-19 DIAGNOSIS — E1151 Type 2 diabetes mellitus with diabetic peripheral angiopathy without gangrene: Secondary | ICD-10-CM | POA: Diagnosis not present

## 2016-11-19 DIAGNOSIS — I110 Hypertensive heart disease with heart failure: Secondary | ICD-10-CM | POA: Diagnosis not present

## 2016-11-19 DIAGNOSIS — Z48812 Encounter for surgical aftercare following surgery on the circulatory system: Secondary | ICD-10-CM | POA: Diagnosis not present

## 2016-11-19 DIAGNOSIS — J449 Chronic obstructive pulmonary disease, unspecified: Secondary | ICD-10-CM | POA: Diagnosis not present

## 2016-11-20 DIAGNOSIS — I2510 Atherosclerotic heart disease of native coronary artery without angina pectoris: Secondary | ICD-10-CM | POA: Diagnosis not present

## 2016-11-20 DIAGNOSIS — E1151 Type 2 diabetes mellitus with diabetic peripheral angiopathy without gangrene: Secondary | ICD-10-CM | POA: Diagnosis not present

## 2016-11-20 DIAGNOSIS — Z48812 Encounter for surgical aftercare following surgery on the circulatory system: Secondary | ICD-10-CM | POA: Diagnosis not present

## 2016-11-20 DIAGNOSIS — I110 Hypertensive heart disease with heart failure: Secondary | ICD-10-CM | POA: Diagnosis not present

## 2016-11-20 DIAGNOSIS — J449 Chronic obstructive pulmonary disease, unspecified: Secondary | ICD-10-CM | POA: Diagnosis not present

## 2016-11-20 DIAGNOSIS — I5040 Unspecified combined systolic (congestive) and diastolic (congestive) heart failure: Secondary | ICD-10-CM | POA: Diagnosis not present

## 2016-11-24 DIAGNOSIS — Z794 Long term (current) use of insulin: Secondary | ICD-10-CM | POA: Diagnosis not present

## 2016-11-24 DIAGNOSIS — Z85828 Personal history of other malignant neoplasm of skin: Secondary | ICD-10-CM | POA: Diagnosis not present

## 2016-11-24 DIAGNOSIS — Z87891 Personal history of nicotine dependence: Secondary | ICD-10-CM | POA: Diagnosis not present

## 2016-11-24 DIAGNOSIS — E785 Hyperlipidemia, unspecified: Secondary | ICD-10-CM | POA: Diagnosis not present

## 2016-11-24 DIAGNOSIS — I130 Hypertensive heart and chronic kidney disease with heart failure and stage 1 through stage 4 chronic kidney disease, or unspecified chronic kidney disease: Secondary | ICD-10-CM | POA: Diagnosis not present

## 2016-11-24 DIAGNOSIS — E1136 Type 2 diabetes mellitus with diabetic cataract: Secondary | ICD-10-CM | POA: Diagnosis not present

## 2016-11-24 DIAGNOSIS — N189 Chronic kidney disease, unspecified: Secondary | ICD-10-CM | POA: Diagnosis not present

## 2016-11-24 DIAGNOSIS — E1151 Type 2 diabetes mellitus with diabetic peripheral angiopathy without gangrene: Secondary | ICD-10-CM | POA: Diagnosis not present

## 2016-11-24 DIAGNOSIS — Z6825 Body mass index (BMI) 25.0-25.9, adult: Secondary | ICD-10-CM | POA: Diagnosis not present

## 2016-11-24 DIAGNOSIS — I25119 Atherosclerotic heart disease of native coronary artery with unspecified angina pectoris: Secondary | ICD-10-CM | POA: Diagnosis not present

## 2016-11-24 DIAGNOSIS — F330 Major depressive disorder, recurrent, mild: Secondary | ICD-10-CM | POA: Diagnosis not present

## 2016-11-24 DIAGNOSIS — I509 Heart failure, unspecified: Secondary | ICD-10-CM | POA: Diagnosis not present

## 2016-11-24 DIAGNOSIS — H547 Unspecified visual loss: Secondary | ICD-10-CM | POA: Diagnosis not present

## 2016-11-24 DIAGNOSIS — E1142 Type 2 diabetes mellitus with diabetic polyneuropathy: Secondary | ICD-10-CM | POA: Diagnosis not present

## 2016-11-24 DIAGNOSIS — M545 Low back pain: Secondary | ICD-10-CM | POA: Diagnosis not present

## 2016-11-25 DIAGNOSIS — I2510 Atherosclerotic heart disease of native coronary artery without angina pectoris: Secondary | ICD-10-CM | POA: Diagnosis not present

## 2016-11-25 DIAGNOSIS — J449 Chronic obstructive pulmonary disease, unspecified: Secondary | ICD-10-CM | POA: Diagnosis not present

## 2016-11-25 DIAGNOSIS — I5040 Unspecified combined systolic (congestive) and diastolic (congestive) heart failure: Secondary | ICD-10-CM | POA: Diagnosis not present

## 2016-11-25 DIAGNOSIS — I110 Hypertensive heart disease with heart failure: Secondary | ICD-10-CM | POA: Diagnosis not present

## 2016-11-25 DIAGNOSIS — E1151 Type 2 diabetes mellitus with diabetic peripheral angiopathy without gangrene: Secondary | ICD-10-CM | POA: Diagnosis not present

## 2016-11-25 DIAGNOSIS — R0602 Shortness of breath: Secondary | ICD-10-CM | POA: Diagnosis not present

## 2016-11-25 DIAGNOSIS — Z48812 Encounter for surgical aftercare following surgery on the circulatory system: Secondary | ICD-10-CM | POA: Diagnosis not present

## 2016-11-26 DIAGNOSIS — I110 Hypertensive heart disease with heart failure: Secondary | ICD-10-CM | POA: Diagnosis not present

## 2016-11-26 DIAGNOSIS — E1151 Type 2 diabetes mellitus with diabetic peripheral angiopathy without gangrene: Secondary | ICD-10-CM | POA: Diagnosis not present

## 2016-11-26 DIAGNOSIS — I2510 Atherosclerotic heart disease of native coronary artery without angina pectoris: Secondary | ICD-10-CM | POA: Diagnosis not present

## 2016-11-26 DIAGNOSIS — J449 Chronic obstructive pulmonary disease, unspecified: Secondary | ICD-10-CM | POA: Diagnosis not present

## 2016-11-26 DIAGNOSIS — I5040 Unspecified combined systolic (congestive) and diastolic (congestive) heart failure: Secondary | ICD-10-CM | POA: Diagnosis not present

## 2016-11-26 DIAGNOSIS — Z48812 Encounter for surgical aftercare following surgery on the circulatory system: Secondary | ICD-10-CM | POA: Diagnosis not present

## 2016-11-27 DIAGNOSIS — I5040 Unspecified combined systolic (congestive) and diastolic (congestive) heart failure: Secondary | ICD-10-CM | POA: Diagnosis not present

## 2016-11-27 DIAGNOSIS — I2510 Atherosclerotic heart disease of native coronary artery without angina pectoris: Secondary | ICD-10-CM | POA: Diagnosis not present

## 2016-11-27 DIAGNOSIS — Z48812 Encounter for surgical aftercare following surgery on the circulatory system: Secondary | ICD-10-CM | POA: Diagnosis not present

## 2016-11-27 DIAGNOSIS — J449 Chronic obstructive pulmonary disease, unspecified: Secondary | ICD-10-CM | POA: Diagnosis not present

## 2016-11-27 DIAGNOSIS — E1151 Type 2 diabetes mellitus with diabetic peripheral angiopathy without gangrene: Secondary | ICD-10-CM | POA: Diagnosis not present

## 2016-11-27 DIAGNOSIS — I110 Hypertensive heart disease with heart failure: Secondary | ICD-10-CM | POA: Diagnosis not present

## 2016-11-28 DIAGNOSIS — Z48812 Encounter for surgical aftercare following surgery on the circulatory system: Secondary | ICD-10-CM | POA: Diagnosis not present

## 2016-11-28 DIAGNOSIS — I110 Hypertensive heart disease with heart failure: Secondary | ICD-10-CM | POA: Diagnosis not present

## 2016-11-28 DIAGNOSIS — I2510 Atherosclerotic heart disease of native coronary artery without angina pectoris: Secondary | ICD-10-CM | POA: Diagnosis not present

## 2016-11-28 DIAGNOSIS — E1151 Type 2 diabetes mellitus with diabetic peripheral angiopathy without gangrene: Secondary | ICD-10-CM | POA: Diagnosis not present

## 2016-11-28 DIAGNOSIS — J449 Chronic obstructive pulmonary disease, unspecified: Secondary | ICD-10-CM | POA: Diagnosis not present

## 2016-11-28 DIAGNOSIS — I5040 Unspecified combined systolic (congestive) and diastolic (congestive) heart failure: Secondary | ICD-10-CM | POA: Diagnosis not present

## 2016-12-02 DIAGNOSIS — I110 Hypertensive heart disease with heart failure: Secondary | ICD-10-CM | POA: Diagnosis not present

## 2016-12-02 DIAGNOSIS — I2510 Atherosclerotic heart disease of native coronary artery without angina pectoris: Secondary | ICD-10-CM | POA: Diagnosis not present

## 2016-12-02 DIAGNOSIS — E1151 Type 2 diabetes mellitus with diabetic peripheral angiopathy without gangrene: Secondary | ICD-10-CM | POA: Diagnosis not present

## 2016-12-02 DIAGNOSIS — Z48812 Encounter for surgical aftercare following surgery on the circulatory system: Secondary | ICD-10-CM | POA: Diagnosis not present

## 2016-12-02 DIAGNOSIS — I5040 Unspecified combined systolic (congestive) and diastolic (congestive) heart failure: Secondary | ICD-10-CM | POA: Diagnosis not present

## 2016-12-02 DIAGNOSIS — J449 Chronic obstructive pulmonary disease, unspecified: Secondary | ICD-10-CM | POA: Diagnosis not present

## 2016-12-03 DIAGNOSIS — I2510 Atherosclerotic heart disease of native coronary artery without angina pectoris: Secondary | ICD-10-CM | POA: Diagnosis not present

## 2016-12-03 DIAGNOSIS — E1151 Type 2 diabetes mellitus with diabetic peripheral angiopathy without gangrene: Secondary | ICD-10-CM | POA: Diagnosis not present

## 2016-12-03 DIAGNOSIS — I5040 Unspecified combined systolic (congestive) and diastolic (congestive) heart failure: Secondary | ICD-10-CM | POA: Diagnosis not present

## 2016-12-03 DIAGNOSIS — I110 Hypertensive heart disease with heart failure: Secondary | ICD-10-CM | POA: Diagnosis not present

## 2016-12-03 DIAGNOSIS — J449 Chronic obstructive pulmonary disease, unspecified: Secondary | ICD-10-CM | POA: Diagnosis not present

## 2016-12-03 DIAGNOSIS — Z48812 Encounter for surgical aftercare following surgery on the circulatory system: Secondary | ICD-10-CM | POA: Diagnosis not present

## 2016-12-05 DIAGNOSIS — J449 Chronic obstructive pulmonary disease, unspecified: Secondary | ICD-10-CM | POA: Diagnosis not present

## 2016-12-05 DIAGNOSIS — I2510 Atherosclerotic heart disease of native coronary artery without angina pectoris: Secondary | ICD-10-CM | POA: Diagnosis not present

## 2016-12-05 DIAGNOSIS — I5040 Unspecified combined systolic (congestive) and diastolic (congestive) heart failure: Secondary | ICD-10-CM | POA: Diagnosis not present

## 2016-12-05 DIAGNOSIS — I110 Hypertensive heart disease with heart failure: Secondary | ICD-10-CM | POA: Diagnosis not present

## 2016-12-05 DIAGNOSIS — Z48812 Encounter for surgical aftercare following surgery on the circulatory system: Secondary | ICD-10-CM | POA: Diagnosis not present

## 2016-12-05 DIAGNOSIS — E1151 Type 2 diabetes mellitus with diabetic peripheral angiopathy without gangrene: Secondary | ICD-10-CM | POA: Diagnosis not present

## 2016-12-09 DIAGNOSIS — Z48812 Encounter for surgical aftercare following surgery on the circulatory system: Secondary | ICD-10-CM | POA: Diagnosis not present

## 2016-12-09 DIAGNOSIS — I5040 Unspecified combined systolic (congestive) and diastolic (congestive) heart failure: Secondary | ICD-10-CM | POA: Diagnosis not present

## 2016-12-09 DIAGNOSIS — I110 Hypertensive heart disease with heart failure: Secondary | ICD-10-CM | POA: Diagnosis not present

## 2016-12-09 DIAGNOSIS — I2510 Atherosclerotic heart disease of native coronary artery without angina pectoris: Secondary | ICD-10-CM | POA: Diagnosis not present

## 2016-12-09 DIAGNOSIS — E1151 Type 2 diabetes mellitus with diabetic peripheral angiopathy without gangrene: Secondary | ICD-10-CM | POA: Diagnosis not present

## 2016-12-09 DIAGNOSIS — J449 Chronic obstructive pulmonary disease, unspecified: Secondary | ICD-10-CM | POA: Diagnosis not present

## 2016-12-10 DIAGNOSIS — R296 Repeated falls: Secondary | ICD-10-CM | POA: Diagnosis not present

## 2016-12-10 DIAGNOSIS — E1165 Type 2 diabetes mellitus with hyperglycemia: Secondary | ICD-10-CM | POA: Insufficient documentation

## 2016-12-10 DIAGNOSIS — Z794 Long term (current) use of insulin: Secondary | ICD-10-CM

## 2016-12-10 DIAGNOSIS — IMO0002 Reserved for concepts with insufficient information to code with codable children: Secondary | ICD-10-CM | POA: Insufficient documentation

## 2016-12-10 DIAGNOSIS — M542 Cervicalgia: Secondary | ICD-10-CM | POA: Diagnosis not present

## 2016-12-10 DIAGNOSIS — R413 Other amnesia: Secondary | ICD-10-CM | POA: Diagnosis not present

## 2016-12-11 DIAGNOSIS — I5040 Unspecified combined systolic (congestive) and diastolic (congestive) heart failure: Secondary | ICD-10-CM | POA: Diagnosis not present

## 2016-12-11 DIAGNOSIS — I2510 Atherosclerotic heart disease of native coronary artery without angina pectoris: Secondary | ICD-10-CM | POA: Diagnosis not present

## 2016-12-11 DIAGNOSIS — E1151 Type 2 diabetes mellitus with diabetic peripheral angiopathy without gangrene: Secondary | ICD-10-CM | POA: Diagnosis not present

## 2016-12-11 DIAGNOSIS — J449 Chronic obstructive pulmonary disease, unspecified: Secondary | ICD-10-CM | POA: Diagnosis not present

## 2016-12-11 DIAGNOSIS — I110 Hypertensive heart disease with heart failure: Secondary | ICD-10-CM | POA: Diagnosis not present

## 2016-12-11 DIAGNOSIS — Z48812 Encounter for surgical aftercare following surgery on the circulatory system: Secondary | ICD-10-CM | POA: Diagnosis not present

## 2016-12-19 DIAGNOSIS — Z79899 Other long term (current) drug therapy: Secondary | ICD-10-CM | POA: Diagnosis not present

## 2016-12-19 DIAGNOSIS — Z1231 Encounter for screening mammogram for malignant neoplasm of breast: Secondary | ICD-10-CM | POA: Diagnosis not present

## 2016-12-19 DIAGNOSIS — L84 Corns and callosities: Secondary | ICD-10-CM | POA: Diagnosis not present

## 2016-12-19 DIAGNOSIS — J449 Chronic obstructive pulmonary disease, unspecified: Secondary | ICD-10-CM | POA: Diagnosis not present

## 2016-12-19 DIAGNOSIS — M5030 Other cervical disc degeneration, unspecified cervical region: Secondary | ICD-10-CM | POA: Insufficient documentation

## 2016-12-19 DIAGNOSIS — F17200 Nicotine dependence, unspecified, uncomplicated: Secondary | ICD-10-CM | POA: Diagnosis not present

## 2016-12-19 DIAGNOSIS — R413 Other amnesia: Secondary | ICD-10-CM | POA: Diagnosis not present

## 2016-12-19 DIAGNOSIS — E119 Type 2 diabetes mellitus without complications: Secondary | ICD-10-CM | POA: Diagnosis not present

## 2016-12-19 DIAGNOSIS — I10 Essential (primary) hypertension: Secondary | ICD-10-CM | POA: Diagnosis not present

## 2016-12-22 ENCOUNTER — Other Ambulatory Visit: Payer: Self-pay | Admitting: Family Medicine

## 2016-12-22 DIAGNOSIS — Z1231 Encounter for screening mammogram for malignant neoplasm of breast: Secondary | ICD-10-CM

## 2016-12-25 DIAGNOSIS — I2510 Atherosclerotic heart disease of native coronary artery without angina pectoris: Secondary | ICD-10-CM | POA: Diagnosis not present

## 2016-12-25 DIAGNOSIS — R0602 Shortness of breath: Secondary | ICD-10-CM | POA: Diagnosis not present

## 2017-01-06 ENCOUNTER — Other Ambulatory Visit: Payer: Self-pay | Admitting: Neurology

## 2017-01-06 DIAGNOSIS — M5030 Other cervical disc degeneration, unspecified cervical region: Secondary | ICD-10-CM

## 2017-01-14 DIAGNOSIS — E119 Type 2 diabetes mellitus without complications: Secondary | ICD-10-CM | POA: Diagnosis not present

## 2017-01-14 DIAGNOSIS — H2513 Age-related nuclear cataract, bilateral: Secondary | ICD-10-CM | POA: Diagnosis not present

## 2017-01-15 ENCOUNTER — Ambulatory Visit
Admission: RE | Admit: 2017-01-15 | Discharge: 2017-01-15 | Disposition: A | Discharge status: Home / Self Care | Payer: Medicare HMO | Source: Ambulatory Visit | Attending: Neurology | Admitting: Neurology

## 2017-01-15 ENCOUNTER — Encounter: Payer: Self-pay | Admitting: Radiology

## 2017-01-15 DIAGNOSIS — M5030 Other cervical disc degeneration, unspecified cervical region: Secondary | ICD-10-CM

## 2017-01-15 DIAGNOSIS — M4642 Discitis, unspecified, cervical region: Secondary | ICD-10-CM | POA: Diagnosis present

## 2017-01-15 DIAGNOSIS — M542 Cervicalgia: Secondary | ICD-10-CM | POA: Diagnosis not present

## 2017-01-15 DIAGNOSIS — M2578 Osteophyte, vertebrae: Secondary | ICD-10-CM | POA: Diagnosis not present

## 2017-01-15 DIAGNOSIS — M4802 Spinal stenosis, cervical region: Secondary | ICD-10-CM | POA: Diagnosis not present

## 2017-01-20 DIAGNOSIS — F17200 Nicotine dependence, unspecified, uncomplicated: Secondary | ICD-10-CM | POA: Diagnosis not present

## 2017-01-25 DIAGNOSIS — R0602 Shortness of breath: Secondary | ICD-10-CM | POA: Diagnosis not present

## 2017-01-25 DIAGNOSIS — I2510 Atherosclerotic heart disease of native coronary artery without angina pectoris: Secondary | ICD-10-CM | POA: Diagnosis not present

## 2017-02-05 DIAGNOSIS — H2513 Age-related nuclear cataract, bilateral: Secondary | ICD-10-CM | POA: Diagnosis not present

## 2017-02-05 DIAGNOSIS — H25012 Cortical age-related cataract, left eye: Secondary | ICD-10-CM | POA: Diagnosis not present

## 2017-02-05 DIAGNOSIS — E119 Type 2 diabetes mellitus without complications: Secondary | ICD-10-CM | POA: Diagnosis not present

## 2017-02-05 DIAGNOSIS — H25013 Cortical age-related cataract, bilateral: Secondary | ICD-10-CM | POA: Diagnosis not present

## 2017-02-05 DIAGNOSIS — H35033 Hypertensive retinopathy, bilateral: Secondary | ICD-10-CM | POA: Diagnosis not present

## 2017-02-05 DIAGNOSIS — H2512 Age-related nuclear cataract, left eye: Secondary | ICD-10-CM | POA: Diagnosis not present

## 2017-02-24 DIAGNOSIS — R0602 Shortness of breath: Secondary | ICD-10-CM | POA: Diagnosis not present

## 2017-02-24 DIAGNOSIS — I2510 Atherosclerotic heart disease of native coronary artery without angina pectoris: Secondary | ICD-10-CM | POA: Diagnosis not present

## 2017-03-10 DIAGNOSIS — H25812 Combined forms of age-related cataract, left eye: Secondary | ICD-10-CM | POA: Diagnosis not present

## 2017-03-10 DIAGNOSIS — H2512 Age-related nuclear cataract, left eye: Secondary | ICD-10-CM | POA: Diagnosis not present

## 2017-03-19 ENCOUNTER — Other Ambulatory Visit: Payer: Self-pay | Admitting: *Deleted

## 2017-03-19 NOTE — Patient Outreach (Signed)
[employer] High Risk screen call. Mrs. Haltiwanger reports a lot of health issues and having had a lot of surgeries recently. She just had L cataract removal and pending the R. 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 He had a hospitalization for flu and pneumonia with respiratory failure and had to go to LTC for a few days. She had gone to [facility] for her heart surgery which is out of network. 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 She is struggling with financial challenges and has to put off speciality visits because of copayments. She used to have LIS but does not this year. She believes she may be in the donut hole.  Pt is very attentive and asks a lot of questions during the interview. She does need education on diabetes and HF. She needs education on her medications, especially her diabetic regimen and how to correctly take her insulins. She was in need of strips for her glucose machine and I directed her to call her MD for an order to be called to the pharmacy. She would like to have the O2 removed from her house as she is no longer using it and I advised her to call her MD so she can order this to be discontinued.  She is appreciative of knowing of the services we provide and wants to take advantage of care management, pharmacy and social work services. I will be referring to all three departments.  Eulah Pont. Myrtie Neither, MSN, [facility] Gerontological Nurse Practitioner [facility] Care Management [phone]

## 2017-03-20 ENCOUNTER — Encounter: Payer: Self-pay | Admitting: *Deleted

## 2017-03-23 ENCOUNTER — Other Ambulatory Visit: Payer: Self-pay | Admitting: *Deleted

## 2017-03-23 NOTE — Patient Outreach (Signed)
[place] [facility]) Care Management  03/23/2017  Heather [name] 1951-06-24 [id number]   Referral  from [facility] NP  to assess for community resource needs to address financial difficulty. Phone call to  Patient who states that she has had multiple hospitalizations and has experienced an increase in medical and medication cost. Patient maintains that she still has the same primary care doctor, however the doctor is not a [facility] provider which makes her ineligible for the [facility] care management program. Patient covered by  [employer] HMO and may be eligible for the [facility] in Home Program, however patient ended the call abruptly before permission was given to refer to that program. Patient requested that I call her backt.   Plan: This Education officer, museum will call patient back within 2 days.    [name] [facility] Care Management [phone]

## 2017-03-24 ENCOUNTER — Other Ambulatory Visit: Payer: Self-pay | Admitting: *Deleted

## 2017-03-24 ENCOUNTER — Other Ambulatory Visit: Payer: Self-pay | Admitting: Pharmacist

## 2017-03-24 NOTE — Patient Outreach (Signed)
[place] [facility]) Care Management  03/24/2017  Heather [name] 1951-08-06 [id number]  Received referral from [place] NP Deloria Lair for [name] regarding medication assistance and medication management.  Patient does not have [facility] provider therefore she is not eligible for [facility] care management.  Patient has been referred to the [place] will sign-off at this time but am happy to assist in the future if patient becomes eligible for [facility] services.    Ralene Bathe, PharmD, [place] [phone]

## 2017-03-24 NOTE — Patient Outreach (Signed)
Received referral for [place] services from [place] NP Deloria Lair for patient Heather [name].   This RN CM was informed by Chrystal [facility]  LCSW - confirmed with pt  Her PCP is not a [facility] MD, makes pt ineligible for [facility] Care management program.   Referral made by Chrystal LCSW to [facility] at [facility] management program- per LCSW's note in Epic pt agreeable.   [facility] RN CM will sign off at this time.      Zara Chess.   [place] Care Management  [phone]

## 2017-03-24 NOTE — Patient Outreach (Addendum)
[place] [facility]) Care Management  03/24/2017  Heather [name] Jun 29, 1951 [id number]   Return phone call to patient to discuss referral to the [facility] In Home Case Management Program. Patient discussed her financial challenges with affording co-pays and states need to prioritize her medical appointments, making sure she addresses what is most imporatnt medically. Patient states that she has had surgery on one eye and will return to the doctor on 03/30/17 to discuss plan for surgery on the other eye. She also hs a follow up appointment with her foot doctor. Patient agrees that follow up to her various specialist are important and accepts referral being made to the [facility] at [facility].   Plan: Patient to be referred to the [facility] at [facility] and [facility] Pharmacist have been notified of referral to be made.    [name] [facility] Care Management [phone]

## 2017-03-27 DIAGNOSIS — R0602 Shortness of breath: Secondary | ICD-10-CM | POA: Diagnosis not present

## 2017-03-27 DIAGNOSIS — I2510 Atherosclerotic heart disease of native coronary artery without angina pectoris: Secondary | ICD-10-CM | POA: Diagnosis not present

## 2017-03-30 DIAGNOSIS — H2511 Age-related nuclear cataract, right eye: Secondary | ICD-10-CM | POA: Diagnosis not present

## 2017-03-30 DIAGNOSIS — H25011 Cortical age-related cataract, right eye: Secondary | ICD-10-CM | POA: Diagnosis not present

## 2017-03-31 ENCOUNTER — Other Ambulatory Visit: Payer: Self-pay | Admitting: *Deleted

## 2017-04-03 ENCOUNTER — Other Ambulatory Visit: Payer: Self-pay | Admitting: *Deleted

## 2017-04-03 NOTE — Patient Outreach (Signed)
[name]) Care Management  04/03/2017  Heather [name] Apr 19, 1951 [id number]   Follow up phone call patient to provide her with community resources to address her financial challenges.  It has been confirmed that patient's current provider is not at [facility] MD. Patient ineligible for [facility] care management program This social worker provided patient with the contact information for [employer]'s co-pay relief program [phone] for additional assistance. The above information also relayed to the the [place] at home program.    [place], [place] Management [phone]

## 2017-04-06 ENCOUNTER — Other Ambulatory Visit: Payer: Self-pay | Admitting: *Deleted

## 2017-04-06 NOTE — Patient Outreach (Signed)
[place] [facility]) Care Management  04/06/2017  Royanne Warshaw 27-Jun-1951 [id number]    Email received from Deforest Hoyles, RN, [place], [place] ext [phone]  stating that patient's case has been assigned to our [place] for follow up.    [name] [facility] Care Management [phone]

## 2017-04-07 DIAGNOSIS — H2511 Age-related nuclear cataract, right eye: Secondary | ICD-10-CM | POA: Diagnosis not present

## 2017-04-07 DIAGNOSIS — H25811 Combined forms of age-related cataract, right eye: Secondary | ICD-10-CM | POA: Diagnosis not present

## 2017-04-27 DIAGNOSIS — R0602 Shortness of breath: Secondary | ICD-10-CM | POA: Diagnosis not present

## 2017-04-27 DIAGNOSIS — I2510 Atherosclerotic heart disease of native coronary artery without angina pectoris: Secondary | ICD-10-CM | POA: Diagnosis not present

## 2017-04-28 DIAGNOSIS — Z794 Long term (current) use of insulin: Secondary | ICD-10-CM | POA: Diagnosis not present

## 2017-04-28 DIAGNOSIS — M2042 Other hammer toe(s) (acquired), left foot: Secondary | ICD-10-CM | POA: Diagnosis not present

## 2017-04-28 DIAGNOSIS — I739 Peripheral vascular disease, unspecified: Secondary | ICD-10-CM | POA: Diagnosis not present

## 2017-04-28 DIAGNOSIS — M2041 Other hammer toe(s) (acquired), right foot: Secondary | ICD-10-CM | POA: Diagnosis not present

## 2017-04-28 DIAGNOSIS — E119 Type 2 diabetes mellitus without complications: Secondary | ICD-10-CM | POA: Diagnosis not present

## 2017-04-28 DIAGNOSIS — I351 Nonrheumatic aortic (valve) insufficiency: Secondary | ICD-10-CM | POA: Diagnosis not present

## 2017-04-28 DIAGNOSIS — I2510 Atherosclerotic heart disease of native coronary artery without angina pectoris: Secondary | ICD-10-CM | POA: Diagnosis not present

## 2017-04-28 DIAGNOSIS — F17200 Nicotine dependence, unspecified, uncomplicated: Secondary | ICD-10-CM | POA: Diagnosis not present

## 2017-04-28 DIAGNOSIS — E1151 Type 2 diabetes mellitus with diabetic peripheral angiopathy without gangrene: Secondary | ICD-10-CM | POA: Diagnosis not present

## 2017-04-28 DIAGNOSIS — Z952 Presence of prosthetic heart valve: Secondary | ICD-10-CM | POA: Diagnosis not present

## 2017-05-27 DIAGNOSIS — R0602 Shortness of breath: Secondary | ICD-10-CM | POA: Diagnosis not present

## 2017-05-27 DIAGNOSIS — I2510 Atherosclerotic heart disease of native coronary artery without angina pectoris: Secondary | ICD-10-CM | POA: Diagnosis not present

## 2017-06-09 ENCOUNTER — Other Ambulatory Visit: Payer: Self-pay | Admitting: Family Medicine

## 2017-06-09 DIAGNOSIS — R7309 Other abnormal glucose: Secondary | ICD-10-CM | POA: Diagnosis not present

## 2017-06-09 DIAGNOSIS — Z1159 Encounter for screening for other viral diseases: Secondary | ICD-10-CM | POA: Diagnosis not present

## 2017-06-09 DIAGNOSIS — Z23 Encounter for immunization: Secondary | ICD-10-CM | POA: Diagnosis not present

## 2017-06-09 DIAGNOSIS — Z0000 Encounter for general adult medical examination without abnormal findings: Secondary | ICD-10-CM | POA: Diagnosis not present

## 2017-06-09 DIAGNOSIS — E119 Type 2 diabetes mellitus without complications: Secondary | ICD-10-CM | POA: Diagnosis not present

## 2017-06-09 DIAGNOSIS — Z1231 Encounter for screening mammogram for malignant neoplasm of breast: Secondary | ICD-10-CM

## 2017-06-27 DIAGNOSIS — I2510 Atherosclerotic heart disease of native coronary artery without angina pectoris: Secondary | ICD-10-CM | POA: Diagnosis not present

## 2017-06-27 DIAGNOSIS — R0602 Shortness of breath: Secondary | ICD-10-CM | POA: Diagnosis not present

## 2017-07-10 DIAGNOSIS — E119 Type 2 diabetes mellitus without complications: Secondary | ICD-10-CM | POA: Diagnosis not present

## 2017-07-10 DIAGNOSIS — Z1389 Encounter for screening for other disorder: Secondary | ICD-10-CM | POA: Diagnosis not present

## 2017-07-10 DIAGNOSIS — I10 Essential (primary) hypertension: Secondary | ICD-10-CM | POA: Diagnosis not present

## 2017-07-10 DIAGNOSIS — R7309 Other abnormal glucose: Secondary | ICD-10-CM | POA: Diagnosis not present

## 2017-07-27 DIAGNOSIS — R0602 Shortness of breath: Secondary | ICD-10-CM | POA: Diagnosis not present

## 2017-07-27 DIAGNOSIS — I2510 Atherosclerotic heart disease of native coronary artery without angina pectoris: Secondary | ICD-10-CM | POA: Diagnosis not present

## 2018-03-29 ENCOUNTER — Other Ambulatory Visit: Payer: Self-pay | Admitting: Family Medicine

## 2018-03-29 DIAGNOSIS — Z1231 Encounter for screening mammogram for malignant neoplasm of breast: Secondary | ICD-10-CM

## 2018-06-09 ENCOUNTER — Other Ambulatory Visit: Payer: Self-pay | Admitting: Family Medicine

## 2018-06-09 DIAGNOSIS — Z13820 Encounter for screening for osteoporosis: Secondary | ICD-10-CM

## 2018-07-08 ENCOUNTER — Other Ambulatory Visit: Payer: Medicare HMO

## 2018-10-19 ENCOUNTER — Ambulatory Visit: Payer: Medicare HMO | Admitting: Psychiatry

## 2018-10-19 ENCOUNTER — Encounter: Payer: Self-pay | Admitting: Psychiatry

## 2018-10-19 ENCOUNTER — Other Ambulatory Visit: Payer: Self-pay

## 2018-10-19 VITALS — BP 146/79 | HR 71 | Temp 97.6°F | Ht <= 58 in | Wt 106.4 lb

## 2018-10-19 DIAGNOSIS — G309 Alzheimer's disease, unspecified: Secondary | ICD-10-CM | POA: Diagnosis not present

## 2018-10-19 DIAGNOSIS — F0280 Dementia in other diseases classified elsewhere without behavioral disturbance: Secondary | ICD-10-CM | POA: Diagnosis not present

## 2018-10-19 DIAGNOSIS — F330 Major depressive disorder, recurrent, mild: Secondary | ICD-10-CM

## 2018-10-19 DIAGNOSIS — F0390 Unspecified dementia without behavioral disturbance: Secondary | ICD-10-CM

## 2018-10-19 MED ORDER — FLUOXETINE HCL 20 MG PO TABS: 20.0000 mg | ORAL_TABLET | Freq: Every day | ORAL | 0 refills | Status: DC

## 2018-10-19 MED ORDER — VORTIOXETINE HBR 10 MG PO TABS: 10.0000 mg | ORAL_TABLET | Freq: Every day | ORAL | 1 refills | Status: DC

## 2018-10-19 NOTE — Patient Instructions (Signed)
Start taking Prozac 20 mg every other day for 10 days and stop taking it.   Vortioxetine oral tablet What is this medicine? Vortioxetine (vor tee [employer] e teen) is used to treat depression. This medicine may be used for other purposes; ask your health care provider or pharmacist if you have questions. COMMON BRAND NAME(S): BRINTELLIX, Trintellix What should I tell my health care provider before I take this medicine? They need to know if you have any of these conditions: -bipolar disorder or a family history of bipolar disorder -bleeding disorders -drink alcohol -glaucoma -liver disease -low levels of sodium in the blood -seizures -suicidal thoughts, plans, or attempt; a previous suicide attempt by you or a family member -take medicines that treat or prevent blood clots -an unusual or allergic reaction to vortioxetine, other medicines, foods, dyes, or preservatives -pregnant or trying to get pregnant -breast-feeding How should I use this medicine? Take this medicine by mouth with a glass of water. Follow the directions on the prescription label. You can take it with or without food. If it upsets your stomach, take it with food. Take your medicine at regular intervals. Do not take it more often than directed. Do not stop taking this medicine suddenly except upon the advice of your doctor. Stopping this medicine too quickly may cause serious side effects or your condition may worsen. A special MedGuide will be given to you by the pharmacist with each prescription and refill. Be sure to read this information carefully each time. Talk to your pediatrician regarding the use of this medicine in children. Special care may be needed. Overdosage: If you think you have taken too much of this medicine contact a poison control center or emergency room at once. NOTE: This medicine is only for you. Do not share this medicine with others. What if I miss a dose? If you miss a dose, take it as soon as you  can. If it is almost time for your next dose, take only that dose. Do not take double or extra doses. What may interact with this medicine? Do not take this medicine with any of the following medications: -linezolid -MAOIs like Carbex, Eldepryl, Marplan, Nardil, and Parnate -methylene blue (injected into a vein) This medicine may also interact with the following medications: -alcohol -aspirin and aspirin-like medicines -carbamazepine -certain medicines for depression, anxiety, or psychotic disturbances -certain medicines for migraine headache like almotriptan, eletriptan, frovatriptan, naratriptan, rizatriptan, sumatriptan, zolmitriptan -diuretics -fentanyl -furazolidone -isoniazid -medicines that treat or prevent blood clots like warfarin, enoxaparin, and dalteparin -NSAIDs, medicines for pain and inflammation, like ibuprofen or naproxen -phenytoin -procarbazine -quinidine -rasagiline -rifampin -supplements like St. John's wort, kava kava, valerian -tramadol -tryptophan This list may not describe all possible interactions. Give your health care provider a list of all the medicines, herbs, non-prescription drugs, or dietary supplements you use. Also tell them if you smoke, drink alcohol, or use illegal drugs. Some items may interact with your medicine. What should I watch for while using this medicine? Tell your doctor if your symptoms do not get better or if they get worse. Visit your doctor or health care professional for regular checks on your progress. Because it may take several weeks to see the full effects of this medicine, it is important to continue your treatment as prescribed by your doctor. Patients and their families should watch out for new or worsening thoughts of suicide or depression. Also watch out for sudden changes in feelings such as feeling anxious, agitated, panicky, irritable, hostile,  aggressive, impulsive, severely restless, overly excited and hyperactive, or  not being able to sleep. If this happens, especially at the beginning of treatment or after a change in dose, call your health care professional. Dennis Bast may get drowsy or dizzy. Do not drive, use machinery, or do anything that needs mental alertness until you know how this medicine affects you. Do not stand or sit up quickly, especially if you are an older patient. This reduces the risk of dizzy or fainting spells. Alcohol may interfere with the effect of this medicine. Avoid alcoholic drinks. Your mouth may get dry. Chewing sugarless gum or sucking hard candy, and drinking plenty of water may help. Contact your doctor if the problem does not go away or is severe. What side effects may I notice from receiving this medicine? Side effects that you should report to your doctor or health care professional as soon as possible: -allergic reactions like skin rash, itching or hives, swelling of the face, lips, or tongue -anxious -black, tarry stools -changes in vision -confusion -elevated mood, decreased need for sleep, racing thoughts, impulsive behavior -eye pain -fast, irregular heartbeat -feeling faint or lightheaded, falls -feeling agitated, angry, or irritable -hallucination, loss of contact with reality -loss of balance or coordination -loss of memory -painful or prolonged erections -restlessness, pacing, inability to keep still -seizures -stiff muscles -suicidal thoughts or other mood changes -trouble sleeping -unusual bleeding or bruising -unusually weak or tired -vomiting Side effects that usually do not require medical attention (report to your doctor or health care professional if they continue or are bothersome): -change in appetite or weight -change in sex drive or performance -constipation -dizziness -dry mouth -nausea This list may not describe all possible side effects. Call your doctor for medical advice about side effects. You may report side effects to FDA at  1-800-FDA-1088. Where should I keep my medicine? Keep out of the reach of children. Store at room temperature between 15 and 30 degrees C (59 and 86 degrees F). Throw away any unused medicine after the expiration date. NOTE: This sheet is a summary. It may not cover all possible information. If you have questions about this medicine, talk to your doctor, pharmacist, or health care provider.  2019 Elsevier/Gold Standard (2016-01-10 16:45:13)
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
## 2018-10-20 ENCOUNTER — Telehealth: Payer: Self-pay

## 2018-10-20 DIAGNOSIS — F330 Major depressive disorder, recurrent, mild: Secondary | ICD-10-CM

## 2018-10-20 MED ORDER — ESCITALOPRAM OXALATE 5 MG PO TABS: 5.0000 mg | ORAL_TABLET | Freq: Every day | ORAL | 0 refills | Status: DC

## 2018-10-20 NOTE — Telephone Encounter (Signed)
Medication management - Telephone call with Sam, pharmacist at [facility] to inform Dr. Shea Evans had sent in Lexapro as an alternative option for Trintellex and requested they review this with patient.  Informed Dr. Shea Evans had already reviewed this medication as a possible alternative option with patient when she was in for her appointment 10/19/2018.

## 2018-10-20 NOTE — Telephone Encounter (Signed)
Sent Lexapro to pharmacy.

## 2018-10-20 NOTE — Telephone Encounter (Signed)
Medication problem - Telephone message from pharmacist at [facility] reporting patient's co-payment for Trintellex would be $255 and that patient could not afford this.  They are requesting an alternative medication.

## 2018-11-18 ENCOUNTER — Ambulatory Visit: Payer: Medicare HMO | Admitting: Psychiatry

## 2018-11-22 ENCOUNTER — Ambulatory Visit (INDEPENDENT_AMBULATORY_CARE_PROVIDER_SITE_OTHER): Payer: Medicare HMO | Admitting: Psychiatry

## 2018-11-22 ENCOUNTER — Encounter: Payer: Self-pay | Admitting: Psychiatry

## 2018-11-22 ENCOUNTER — Other Ambulatory Visit: Payer: Self-pay

## 2018-11-22 DIAGNOSIS — F330 Major depressive disorder, recurrent, mild: Secondary | ICD-10-CM

## 2018-11-22 DIAGNOSIS — F5105 Insomnia due to other mental disorder: Secondary | ICD-10-CM

## 2018-11-22 DIAGNOSIS — G309 Alzheimer's disease, unspecified: Secondary | ICD-10-CM

## 2018-11-22 DIAGNOSIS — F0390 Unspecified dementia without behavioral disturbance: Secondary | ICD-10-CM

## 2018-11-22 DIAGNOSIS — F0280 Dementia in other diseases classified elsewhere without behavioral disturbance: Secondary | ICD-10-CM | POA: Diagnosis not present

## 2018-11-22 MED ORDER — TRAZODONE HCL 50 MG PO TABS: 25.0000 mg | ORAL_TABLET | Freq: Every evening | ORAL | 0 refills | Status: DC | PRN

## 2018-11-22 MED ORDER — ESCITALOPRAM OXALATE 10 MG PO TABS: 10.0000 mg | ORAL_TABLET | Freq: Every day | ORAL | 0 refills | Status: DC

## 2018-11-22 NOTE — Progress Notes (Signed)
Virtual Visit via Telephone Note  I connected with Heather [name] on 11/22/18 at  8:45 AM EDT by telephone and verified that I am speaking with the correct person using two identifiers.   I discussed the limitations, risks, security and privacy concerns of performing an evaluation and management service by telephone and the availability of in person appointments. I also discussed with the patient that there may be a patient responsible charge related to this service. The patient expressed understanding and agreed to proceed.   I discussed the assessment and treatment plan with the patient. The patient was provided an opportunity to ask questions and all were answered. The patient agreed with the plan and demonstrated an understanding of the instructions.   The patient was advised to call back or seek an in-person evaluation if the symptoms worsen or if the condition fails to improve as anticipated.  I provided 15 minutes of non-face-to-face time during this encounter.   Ursula Alert, MD  [facility] MD OP Progress Note  11/22/2018 10:27 AM Heather [name]  MRN:  [id number]  Chief Complaint: ' Follow up Chief Complaint    Follow-up     HPI: Janell is a 68 yr old CF , married , lives in [place], has a history of MDD, Neurocognitive disorder , CAD , tobacco use disorder, PAD, DM, neuropathy, was evaluated by phone today.  Patient today reports she is tolerating her Lexapro well. Patient reports she has not noticed much benefits from Lexapro yet. She reports she feels the same with regards to her mood symptoms. She has noticed sadness, anhedonia, lack of motivation as well as sleep problems.  She is currently not taking any medication for sleep. Discussed starting Trazodone. She agrees with plan.  Patient reports she continues to struggle with memory problems , although not worsening.  Patient appeared to be alert, oriented to person , place and situation.  Patient denied SI/HI/AH/VH.  She is  worried about Covid 35 - hence provided supportive therapy.   Visit Diagnosis:    ICD-10-CM   1. MDD (major depressive disorder), recurrent episode, mild (HCC) F33.0 escitalopram (LEXAPRO) 10 MG tablet    escitalopram (LEXAPRO) 10 MG tablet    DISCONTINUED: escitalopram (LEXAPRO) 10 MG tablet  2. Insomnia due to mental condition F51.05 traZODone (DESYREL) 50 MG tablet  3. Major neurocognitive disorder due to Alzheimer's disease, probable, without behavioral disturbance ([place]) G30.9    F02.80     Past Psychiatric History: Reviewed past psychiatric history from my progress note on 10/19/2018.  Past trials of Seroquel, Prozac, Wellbutrin, risperidone.  Past Medical History:  Past Medical History:  Diagnosis Date  . Anxiety   . Bipolar disorder ([place])   . Cancer ([place])    skin  . Depression   . Diabetes mellitus   . Diabetes mellitus, type II ([place])   . History of artificial heart valve   . Schizophrenia ([place])   . Vertigo     Past Surgical History:  Procedure Laterality Date  . ABDOMINAL HYSTERECTOMY    . BACK SURGERY    . CARDIAC VALVE REPLACEMENT    . CHOLECYSTECTOMY    . HEMORRHOID SURGERY    . TUBAL LIGATION      Family Psychiatric History: I have reviewed family psychiatric history from my progress note on 10/19/2018.  Family History:  Family History  Problem Relation Age of Onset  . Mental illness Mother   . Breast cancer Neg Hx     Social History: Reviewed social history  from my progress note on 10/19/2018. Social History   Socioeconomic History  . Marital status: Married    Spouse name: rowland  . Number of children: 2  . Years of education: Not on file  . Highest education level: High school graduate  Occupational History  . Not on file  Social Needs  . Financial resource strain: Somewhat hard  . Food insecurity:    Worry: Often true    Inability: Often true  . Transportation needs:    Medical: No    Non-medical: No  Tobacco Use  . Smoking status:  Current Every Day Smoker    Packs/day: 1.50    Types: Cigarettes  . Smokeless tobacco: Never Used  Substance and Sexual Activity  . Alcohol use: Yes    Alcohol/week: 1.0 - 2.0 standard drinks    Types: 1 - 2 Cans of beer per week  . Drug use: Not Currently  . Sexual activity: Not Currently  Lifestyle  . Physical activity:    Days per week: 0 days    Minutes per session: 0 min  . Stress: Not on file  Relationships  . Social connections:    Talks on phone: Not on file    Gets together: Not on file    Attends religious service: More than 4 times per year    Active member of club or organization: No    Attends meetings of clubs or organizations: Never    Relationship status: Married  Other Topics Concern  . Not on file  Social History Narrative  . Not on file    Allergies:  Allergies  Allergen Reactions  . Iodinated Diagnostic Agents Hives  . Sulfa Antibiotics   . Shellfish Allergy Rash and Other (See Comments)    Scallops specifically cause VOMITING Scallops specifically cause VOMITING     Metabolic Disorder Labs: No results found for: HGBA1C, MPG No results found for: PROLACTIN No results found for: CHOL, TRIG, HDL, CHOLHDL, VLDL, LDLCALC No results found for: TSH  Therapeutic Level Labs: No results found for: LITHIUM No results found for: VALPROATE No components found for:  CBMZ  Current Medications: Current Outpatient Medications  Medication Sig Dispense Refill  . ACCU-CHEK AVIVA PLUS test strip     . Accu-Chek Softclix Lancets lancets     . Alcohol Swabs (B-D SINGLE USE SWABS REGULAR) PADS     . aspirin EC 81 MG tablet Take 81 mg by mouth daily.    [name] atorvastatin (LIPITOR) 80 MG tablet     . Blood Glucose Calibration (ACCU-CHEK AVIVA) SOLN     . Blood Glucose Monitoring Suppl (ACCU-CHEK AVIVA PLUS) w/Device KIT     . chlorhexidine (PERIDEX) 0.12 % solution Use as directed 15 mLs in the mouth or throat 2 (two) times daily.    . cholecalciferol (VITAMIN D)  1000 units tablet Take 1,000 Units by mouth daily. Takes 2000 units daily.    . clopidogrel (PLAVIX) 75 MG tablet     . cyclobenzaprine (FLEXERIL) 10 MG tablet     . donepezil (ARICEPT) 5 MG tablet Take 5 mg by mouth at bedtime.    [name] escitalopram (LEXAPRO) 10 MG tablet Take 1 tablet (10 mg total) by mouth daily. 90 tablet 0  . escitalopram (LEXAPRO) 10 MG tablet Take 1 tablet (10 mg total) by mouth daily. 30 tablet 0  . fluticasone (FLONASE) 50 MCG/ACT nasal spray     . Fluticasone-Salmeterol (ADVAIR) 250-50 MCG/DOSE AEPB     . furosemide (LASIX)  20 MG tablet     . gabapentin (NEURONTIN) 300 MG capsule Take 300 mg by mouth 3 (three) times daily.    . Insulin Aspart FlexPen 100 UNIT/ML SOPN     . insulin glargine (LANTUS) 100 UNIT/ML injection Inject 40 Units into the skin at bedtime.    . insulin lispro (HUMALOG KWIKPEN) 100 UNIT/ML KwikPen Inject into the skin.    . Insulin Syringe-Needle U-100 (MONOJECT INS SYR 1CC/30G) 30G X 5/16" 1 ML MISC     . metFORMIN (GLUCOPHAGE) 1000 MG tablet Take 1,000 mg by mouth 2 (two) times daily with a meal.    . metoprolol succinate (TOPROL-XL) 25 MG 24 hr tablet     . Multiple Vitamins-Minerals (WOMENS MULTI VITAMIN & MINERAL PO) Take 1 tablet by mouth every morning.    . traZODone (DESYREL) 50 MG tablet Take 0.5-1 tablets (25-50 mg total) by mouth at bedtime as needed for sleep. 30 tablet 0   No current facility-administered medications for this visit.      Musculoskeletal: Strength & Muscle Tone: unable to assess Gait & Station: unable to assess Patient leans: unable to assess  Psychiatric Specialty Exam: Review of Systems  Psychiatric/Behavioral: The patient is nervous/anxious.   All other systems reviewed and are negative.   There were no vitals taken for this visit.There is no height or weight on file to calculate BMI.  General Appearance: unable to assess  Eye Contact:  unable to assess  Speech:  Clear and Coherent  Volume:  Normal   Mood:  Anxious and Dysphoric  Affect:  unable to assess  Thought Process:  Goal Directed and Descriptions of Associations: Intact  Orientation:  Full (Time, Place, and Person)  Thought Content: Logical   Suicidal Thoughts:  No  Homicidal Thoughts:  No  Memory:  Immediate;   Fair Recent;   baseline Remote;   limited  Judgement:  Fair  Insight:  Fair  Psychomotor Activity:  likely normal  Concentration:  Concentration: Fair and Attention Span: Fair  Recall:  [employer] of Knowledge: Fair  Language: Fair  Akathisia:  No  Handed:  Right  AIMS (if indicated): unable to assess  Assets:  Communication Skills Desire for Improvement Housing  ADL's:  Intact  Cognition: WNL  Sleep:  restless   Screenings: PHQ2-9     Patient Outreach Telephone from 03/19/2017 in [place]  PHQ-2 Total Score  2  PHQ-9 Total Score  8       Assessment and Plan: Darrin is a 68 yr old female, married, on disability, lives in [place], has a history of depression, dementia, diabetes melitis, coronary artery disease, aortic valve replacement, was evaluated by phone today.  Patient is biologically predisposed given her history of trauma as well as mental health problems.  Patient will continue to need medication changes since she struggles with depression and anxiety symptoms.  Plan as noted below.  Plan MDD-unstable Increase Lexapro to 10 mg p.o. daily.  For insomnia- unstable Start trazodone 25 to 50 mg p.o. nightly as needed for sleep. Discussed sleep hygiene techniques.  For dementia-unstable She will continue to work with Dr. Melrose Nakayama her neurologist.  Follow-up in clinic in 2 weeks or sooner if needed.  I have spent atleast 15 minutes non face to face with patient today. More than 50 % of the time was spent for psychoeducation and supportive psychotherapy and care coordination.  This note was generated in part or whole with voice recognition software. Voice recognition  is usually  quite accurate but there are transcription errors that can and very often do occur. I apologize for any typographical errors that were not detected and corrected.        Ursula Alert, MD 11/23/2018, 8:36 AM

## 2018-11-23 ENCOUNTER — Encounter: Payer: Self-pay | Admitting: Psychiatry

## 2018-12-06 ENCOUNTER — Encounter: Payer: Self-pay | Admitting: Psychiatry

## 2018-12-06 ENCOUNTER — Ambulatory Visit (INDEPENDENT_AMBULATORY_CARE_PROVIDER_SITE_OTHER): Payer: Medicare HMO | Admitting: Psychiatry

## 2018-12-06 ENCOUNTER — Other Ambulatory Visit: Payer: Self-pay

## 2018-12-06 DIAGNOSIS — F330 Major depressive disorder, recurrent, mild: Secondary | ICD-10-CM

## 2018-12-06 DIAGNOSIS — F0390 Unspecified dementia without behavioral disturbance: Secondary | ICD-10-CM

## 2018-12-06 DIAGNOSIS — G309 Alzheimer's disease, unspecified: Secondary | ICD-10-CM | POA: Diagnosis not present

## 2018-12-06 DIAGNOSIS — F5105 Insomnia due to other mental disorder: Secondary | ICD-10-CM

## 2018-12-06 DIAGNOSIS — F0280 Dementia in other diseases classified elsewhere without behavioral disturbance: Secondary | ICD-10-CM | POA: Diagnosis not present

## 2018-12-06 MED ORDER — TRAZODONE HCL 50 MG PO TABS: 25.0000 mg | ORAL_TABLET | Freq: Every evening | ORAL | 0 refills | Status: DC | PRN

## 2018-12-06 NOTE — Progress Notes (Signed)
TC on 12-06-18 at  9:10 pt allergies were reviewed with no updates. Pt medical and surgical hx was reviewed with no changes. Pt medications were reviewed and updated. No vital taken because this is a phone visit.

## 2018-12-06 NOTE — Progress Notes (Signed)
Virtual Visit via Telephone Note  I connected with Heather [name] on 12/06/18 at  9:45 AM EDT by telephone and verified that I am speaking with the correct person using two identifiers.   I discussed the limitations, risks, security and privacy concerns of performing an evaluation and management service by telephone and the availability of in person appointments. I also discussed with the patient that there may be a patient responsible charge related to this service. The patient expressed understanding and agreed to proceed.   I discussed the assessment and treatment plan with the patient. The patient was provided an opportunity to ask questions and all were answered. The patient agreed with the plan and demonstrated an understanding of the instructions.   The patient was advised to call back or seek an in-person evaluation if the symptoms worsen or if the condition fails to improve as anticipated.  I provided 15 minutes of non-face-to-face time during this encounter.   Ursula Alert, MD  [facility] MD OP Progress Note  12/06/2018 9:50 AM Heather [name]  MRN:  [id number]  Chief Complaint:  Chief Complaint    Follow-up; Medication Refill     HPI: Pete is a 68 year old , lives in [place], [place] , married , has a history of MDD, Dementia, CAD, tobacco use disorder , PAD , DM, neuropathy, was evaluated by phone today.  Patient today reports she is doing better with regards to her anxiety and depressive symptoms. She is tolerating Lexapro , higher dosage well. She denies any side effects.  She is taking Trazodone for sleep now, started last visit. Sleep is better , however reports some weird dreams on and off. She howeverwants to give it more time .  She reports memory seems to be OK , has not noticed any worsening. She has good support from her husband . Pt today seems to be alert, oriented to person, place, situation.  Patient denies any suicidality, homicidality or perceptual disturbances  today.   Visit Diagnosis:    ICD-10-CM   1. MDD (major depressive disorder), recurrent episode, mild ([place]) F33.0   2. Insomnia due to mental condition F51.05 traZODone (DESYREL) 50 MG tablet  3. Major neurocognitive disorder due to Alzheimer's disease, probable, without behavioral disturbance ([place]) G30.9    F02.80     Past Psychiatric History: I have reviewed past psychiatric history from my progress note on 10/19/2018. Past trials of Prozac, wellbutrin, seroquel, risperidone.  Past Medical History:  Past Medical History:  Diagnosis Date  . Anxiety   . Bipolar disorder ([place])   . Cancer ([place])    skin  . Depression   . Diabetes mellitus   . Diabetes mellitus, type II ([name])   . History of artificial heart valve   . Schizophrenia ([place])   . Vertigo     Past Surgical History:  Procedure Laterality Date  . ABDOMINAL HYSTERECTOMY    . BACK SURGERY    . CARDIAC VALVE REPLACEMENT    . CHOLECYSTECTOMY    . HEMORRHOID SURGERY    . TUBAL LIGATION      Family Psychiatric History: I have reviewed family history from progress notes on 10/19/2018  Family History:  Family History  Problem Relation Age of Onset  . Mental illness Mother   . Breast cancer Neg Hx     Social History:  Social History   Socioeconomic History  . Marital status: Married    Spouse name: rowland  . Number of children: 2  . Years of  education: Not on file  . Highest education level: High school graduate  Occupational History  . Not on file  Social Needs  . Financial resource strain: Somewhat hard  . Food insecurity:    Worry: Often true    Inability: Often true  . Transportation needs:    Medical: No    Non-medical: No  Tobacco Use  . Smoking status: Current Every Day Smoker    Packs/day: 1.50    Types: Cigarettes  . Smokeless tobacco: Never Used  Substance and Sexual Activity  . Alcohol use: Yes    Alcohol/week: 1.0 - 2.0 standard drinks    Types: 1 - 2 Cans of beer per week  . Drug use:  Not Currently  . Sexual activity: Not Currently  Lifestyle  . Physical activity:    Days per week: 0 days    Minutes per session: 0 min  . Stress: Not on file  Relationships  . Social connections:    Talks on phone: Not on file    Gets together: Not on file    Attends religious service: More than 4 times per year    Active member of club or organization: No    Attends meetings of clubs or organizations: Never    Relationship status: Married  Other Topics Concern  . Not on file  Social History Narrative  . Not on file    Allergies:  Allergies  Allergen Reactions  . Iodinated Diagnostic Agents Hives  . Sulfa Antibiotics   . Shellfish Allergy Rash and Other (See Comments)    Scallops specifically cause VOMITING Scallops specifically cause VOMITING     Metabolic Disorder Labs: No results found for: HGBA1C, MPG No results found for: PROLACTIN No results found for: CHOL, TRIG, HDL, CHOLHDL, VLDL, LDLCALC No results found for: TSH  Therapeutic Level Labs: No results found for: LITHIUM No results found for: VALPROATE No components found for:  CBMZ  Current Medications: Current Outpatient Medications  Medication Sig Dispense Refill  . ACCU-CHEK AVIVA PLUS test strip     . Accu-Chek Softclix Lancets lancets     . Alcohol Swabs (B-D SINGLE USE SWABS REGULAR) PADS     . aspirin EC 81 MG tablet Take 81 mg by mouth daily.    [name] atorvastatin (LIPITOR) 80 MG tablet     . Blood Glucose Calibration (ACCU-CHEK AVIVA) SOLN     . Blood Glucose Monitoring Suppl (ACCU-CHEK AVIVA PLUS) w/Device KIT     . chlorhexidine (PERIDEX) 0.12 % solution Use as directed 15 mLs in the mouth or throat 2 (two) times daily.    . cholecalciferol (VITAMIN D) 1000 units tablet Take 1,000 Units by mouth daily. Takes 2000 units daily.    . clopidogrel (PLAVIX) 75 MG tablet     . cyclobenzaprine (FLEXERIL) 10 MG tablet     . donepezil (ARICEPT) 5 MG tablet Take 5 mg by mouth at bedtime.    [name] escitalopram  (LEXAPRO) 10 MG tablet Take 1 tablet (10 mg total) by mouth daily. 90 tablet 0  . fluticasone (FLONASE) 50 MCG/ACT nasal spray     . Fluticasone-Salmeterol (ADVAIR) 250-50 MCG/DOSE AEPB     . furosemide (LASIX) 20 MG tablet     . gabapentin (NEURONTIN) 300 MG capsule Take 300 mg by mouth 3 (three) times daily.    . Insulin Aspart FlexPen 100 UNIT/ML SOPN     . insulin glargine (LANTUS) 100 UNIT/ML injection Inject 40 Units into the skin at bedtime.    [name]  insulin lispro (HUMALOG KWIKPEN) 100 UNIT/ML KwikPen Inject into the skin.    . Insulin Syringe-Needle U-100 (MONOJECT INS SYR 1CC/30G) 30G X 5/16" 1 ML MISC     . metFORMIN (GLUCOPHAGE) 1000 MG tablet Take 1,000 mg by mouth 2 (two) times daily with a meal.    . metoprolol succinate (TOPROL-XL) 25 MG 24 hr tablet     . Multiple Vitamins-Minerals (WOMENS MULTI VITAMIN & MINERAL PO) Take 1 tablet by mouth every morning.    . traZODone (DESYREL) 50 MG tablet Take 0.5-1 tablets (25-50 mg total) by mouth at bedtime as needed for sleep. 90 tablet 0   No current facility-administered medications for this visit.      Musculoskeletal: Strength & Muscle Tone: UTA Gait & Station: UTA Patient leans: N/A  Psychiatric Specialty Exam: Review of Systems  Psychiatric/Behavioral: The patient is nervous/anxious and has insomnia (improving).   All other systems reviewed and are negative.   There were no vitals taken for this visit.There is no height or weight on file to calculate BMI.  General Appearance: UTA  Eye Contact:  UTA  Speech:  Normal Rate  Volume:  Normal  Mood:  Anxious  Affect:  UTA  Thought Process:  Goal Directed and Descriptions of Associations: Intact  Orientation:  Full (Time, Place, and Person)  Thought Content: Logical   Suicidal Thoughts:  No  Homicidal Thoughts:  No  Memory:  Immediate;   Fair Recent;   Fair Remote;   Limited  Judgement:  Fair  Insight:  Fair  Psychomotor Activity:  Normal  Concentration:   Concentration: Fair and Attention Span: Fair  Recall:  [employer] of Knowledge: Fair  Language: Fair  Akathisia:  No  Handed:  Right  AIMS (if indicated): Denies tremors, rigidity,stiffness  Assets:  Communication Skills Desire for Improvement Social Support  ADL's:  Intact  Cognition: Impaired,  Mild  Sleep:  improving , has dreams on and off   Screenings: PHQ2-9     Patient Outreach Telephone from 03/19/2017 in [place]  PHQ-2 Total Score  2  PHQ-9 Total Score  8       Assessment and Plan: Nyeemah is a 68 year old CF,married , on disability, lives in [place], has a history of depression, dementia,DM,CAD,Aortic valve replacement was evaluated by phone today. Patient is doing well, hence will continue medications as noted below.  Plan  MDD - Improving Lexapro 10 mg po daily.  For Insomnia - Improving Trazodone 25-50 mg po qhs prn. Continue sleep hygiene tips.  For Dementia - Improving Continue to follow up with Dr.Potter.  Follow up in clinic in 4 weeks or sooner.  I have spent atleast 15 minutes non face to face with patient today. More than 50 % of the time was spent for psychoeducation and supportive psychotherapy and care coordination.   This note was generated in part or whole with voice recognition software. Voice recognition is usually quite accurate but there are transcription errors that can and very often do occur. I apologize for any typographical errors that were not detected and corrected.         Ursula Alert, MD 12/06/2018, 9:50 AM

## 2019-01-06 ENCOUNTER — Ambulatory Visit (INDEPENDENT_AMBULATORY_CARE_PROVIDER_SITE_OTHER): Payer: Medicare HMO | Admitting: Psychiatry

## 2019-01-06 ENCOUNTER — Other Ambulatory Visit: Payer: Self-pay

## 2019-01-06 ENCOUNTER — Encounter: Payer: Self-pay | Admitting: Psychiatry

## 2019-01-06 DIAGNOSIS — F5105 Insomnia due to other mental disorder: Secondary | ICD-10-CM | POA: Diagnosis not present

## 2019-01-06 DIAGNOSIS — F0390 Unspecified dementia without behavioral disturbance: Secondary | ICD-10-CM

## 2019-01-06 DIAGNOSIS — G309 Alzheimer's disease, unspecified: Secondary | ICD-10-CM

## 2019-01-06 DIAGNOSIS — F330 Major depressive disorder, recurrent, mild: Secondary | ICD-10-CM

## 2019-01-06 DIAGNOSIS — F0280 Dementia in other diseases classified elsewhere without behavioral disturbance: Secondary | ICD-10-CM

## 2019-01-06 MED ORDER — ESCITALOPRAM OXALATE 10 MG PO TABS: 10.0000 mg | ORAL_TABLET | Freq: Every day | ORAL | 0 refills | Status: DC

## 2019-01-06 MED ORDER — TRAZODONE HCL 50 MG PO TABS: 25.0000 mg | ORAL_TABLET | Freq: Every evening | ORAL | 0 refills | Status: DC | PRN

## 2019-01-06 NOTE — Progress Notes (Signed)
Virtual Visit via Video Note  I connected with Heather [name] on 01/06/19 at  9:30 AM EDT by a video enabled telemedicine application and verified that I am speaking with the correct person using two identifiers.   I discussed the limitations of evaluation and management by telemedicine and the availability of in person appointments. The patient expressed understanding and agreed to proceed.  I discussed the assessment and treatment plan with the patient. The patient was provided an opportunity to ask questions and all were answered. The patient agreed with the plan and demonstrated an understanding of the instructions.   The patient was advised to call back or seek an in-person evaluation if the symptoms worsen or if the condition fails to improve as anticipated.   [place] MD OP Progress Note  01/06/2019 12:49 PM Sabriya Yono  MRN:  [id number]  Chief Complaint:  Chief Complaint    Follow-up     HPI: Heather [name] is a 68 year old female, lives in [place], married, has a history of MDD, dementia, CAD, tobacco use disorder, PAD, diabetes melitis, neuropathy was evaluated by telemedicine today.  Patient reports she is currently anxious about the current COVID-19 outbreak.  She however reports she has been coping okay.  The Lexapro has been helpful with her mood symptoms in general.  She is compliant on the Lexapro.  She denies any side effects.  She reports sleep is good on the trazodone.  Patient reports her memory seems to be okay.  She has been managing a lot of work around the home, running errands and so on all by herself.  Her husband recently had eye surgery and is currently in recovery.  Patient denies any suicidality, homicidality or perceptual disturbances.  Patient denies any other concerns today. Visit Diagnosis:    ICD-10-CM   1. MDD (major depressive disorder), recurrent episode, mild ([facility]) F33.0 escitalopram (LEXAPRO) 10 MG tablet  2. Insomnia due to mental condition F51.05 traZODone  (DESYREL) 50 MG tablet  3. Major neurocognitive disorder due to Alzheimer's disease, probable, without behavioral disturbance ([place]) G30.9    F02.80     Past Psychiatric History: Reviewed past psychiatric history from my progress note on 10/19/2018.  Past trials of Prozac, Wellbutrin, Seroquel, risperidone.  Past Medical History:  Past Medical History:  Diagnosis Date  . Anxiety   . Bipolar disorder ([place])   . Cancer ([place])    skin  . Depression   . Diabetes mellitus   . Diabetes mellitus, type II ([place])   . History of artificial heart valve   . Schizophrenia ([place])   . Vertigo     Past Surgical History:  Procedure Laterality Date  . ABDOMINAL HYSTERECTOMY    . BACK SURGERY    . CARDIAC VALVE REPLACEMENT    . CHOLECYSTECTOMY    . HEMORRHOID SURGERY    . TUBAL LIGATION      Family Psychiatric History: Reviewed family psychiatric history from my progress note on 10/19/2018.  Family History:  Family History  Problem Relation Age of Onset  . Mental illness Mother   . Breast cancer Neg Hx     Social History: Reviewed social history from my progress note on 10/19/2018. Social History   Socioeconomic History  . Marital status: Married    Spouse name: Heather [name]  . Number of children: 2  . Years of education: Not on file  . Highest education level: High school graduate  Occupational History  . Not on file  Social Needs  . Emergency planning/management officer  strain: Somewhat hard  . Food insecurity:    Worry: Often true    Inability: Often true  . Transportation needs:    Medical: No    Non-medical: No  Tobacco Use  . Smoking status: Current Every Day Smoker    Packs/day: 1.50    Types: Cigarettes  . Smokeless tobacco: Never Used  Substance and Sexual Activity  . Alcohol use: Yes    Alcohol/week: 1.0 - 2.0 standard drinks    Types: 1 - 2 Cans of beer per week  . Drug use: Not Currently  . Sexual activity: Not Currently  Lifestyle  . Physical activity:    Days per week: 0 days     Minutes per session: 0 min  . Stress: Not on file  Relationships  . Social connections:    Talks on phone: Not on file    Gets together: Not on file    Attends religious service: More than 4 times per year    Active member of club or organization: No    Attends meetings of clubs or organizations: Never    Relationship status: Married  Other Topics Concern  . Not on file  Social History Narrative  . Not on file    Allergies:  Allergies  Allergen Reactions  . Iodinated Diagnostic Agents Hives  . Sulfa Antibiotics   . Shellfish Allergy Rash and Other (See Comments)    Scallops specifically cause VOMITING Scallops specifically cause VOMITING     Metabolic Disorder Labs: No results found for: HGBA1C, MPG No results found for: PROLACTIN No results found for: CHOL, TRIG, HDL, CHOLHDL, VLDL, LDLCALC No results found for: TSH  Therapeutic Level Labs: No results found for: LITHIUM No results found for: VALPROATE No components found for:  CBMZ  Current Medications: Current Outpatient Medications  Medication Sig Dispense Refill  . ACCU-CHEK AVIVA PLUS test strip     . Accu-Chek Softclix Lancets lancets     . Alcohol Swabs (B-D SINGLE USE SWABS REGULAR) PADS     . aspirin EC 81 MG tablet Take 81 mg by mouth daily.    [name] atorvastatin (LIPITOR) 80 MG tablet     . Blood Glucose Calibration (ACCU-CHEK AVIVA) SOLN     . Blood Glucose Monitoring Suppl (ACCU-CHEK AVIVA PLUS) w/Device KIT     . chlorhexidine (PERIDEX) 0.12 % solution Use as directed 15 mLs in the mouth or throat 2 (two) times daily.    . cholecalciferol (VITAMIN D) 1000 units tablet Take 1,000 Units by mouth daily. Takes 2000 units daily.    . clopidogrel (PLAVIX) 75 MG tablet     . cyclobenzaprine (FLEXERIL) 10 MG tablet     . donepezil (ARICEPT) 5 MG tablet Take 5 mg by mouth at bedtime.    [name] escitalopram (LEXAPRO) 10 MG tablet Take 1 tablet (10 mg total) by mouth daily. 90 tablet 0  . fluticasone (FLONASE) 50  MCG/ACT nasal spray     . Fluticasone-Salmeterol (ADVAIR) 250-50 MCG/DOSE AEPB     . furosemide (LASIX) 20 MG tablet     . gabapentin (NEURONTIN) 300 MG capsule Take 300 mg by mouth 3 (three) times daily.    . Insulin Aspart FlexPen 100 UNIT/ML SOPN     . insulin glargine (LANTUS) 100 UNIT/ML injection Inject 40 Units into the skin at bedtime.    . insulin lispro (HUMALOG KWIKPEN) 100 UNIT/ML KwikPen Inject into the skin.    . Insulin Syringe-Needle U-100 (MONOJECT INS SYR 1CC/30G) 30G X  5/16" 1 ML MISC     . metFORMIN (GLUCOPHAGE) 1000 MG tablet Take 1,000 mg by mouth 2 (two) times daily with a meal.    . metoprolol succinate (TOPROL-XL) 25 MG 24 hr tablet     . Multiple Vitamins-Minerals (WOMENS MULTI VITAMIN & MINERAL PO) Take 1 tablet by mouth every morning.    . traZODone (DESYREL) 50 MG tablet Take 0.5-1 tablets (25-50 mg total) by mouth at bedtime as needed for sleep. 90 tablet 0   No current facility-administered medications for this visit.      Musculoskeletal: Strength & Muscle Tone: within normal limits Gait & Station: normal Patient leans: N/A  Psychiatric Specialty Exam: Review of Systems  Psychiatric/Behavioral: The patient is nervous/anxious.   All other systems reviewed and are negative.   There were no vitals taken for this visit.There is no height or weight on file to calculate BMI.  General Appearance: Casual  Eye Contact:  Fair  Speech:  Clear and Coherent  Volume:  Normal  Mood:  Anxious  Affect:  Congruent  Thought Process:  Goal Directed and Descriptions of Associations: Intact  Orientation:  Full (Time, Place, and Person)  Thought Content: Logical   Suicidal Thoughts:  No  Homicidal Thoughts:  No  Memory:  Immediate;   Fair Recent;   Fair Remote;   Fair  Judgement:  Fair  Insight:  Fair  Psychomotor Activity:  Normal  Concentration:  Concentration: Fair and Attention Span: Fair  Recall:  [employer] of Knowledge: Fair  Language: Fair  Akathisia:   No  Handed:  Right  AIMS (if indicated): UTA  Assets:  Communication Skills Desire for Improvement Housing Intimacy Social Support  ADL's:  Intact  Cognition: WNL  Sleep:  Fair   Screenings: PHQ2-9     Patient Outreach Telephone from 03/19/2017 in [place]  PHQ-2 Total Score  2  PHQ-9 Total Score  8       Assessment and Plan: Kaytelyn is a 68 year old Caucasian female, married, on disability, lives in [place], has a history of depression, dementia, diabetes melitis, CAD, aortic valve replacement was evaluated by telemedicine today.  Patient does have anxiety about the current social situations of coronavirus outbreak.  She also has a husband who is currently in recovery from surgery.  She however has been coping okay and is compliant on medications.  Plan as noted below.  Plan MDD- improving Lexapro 10 mg p.o. daily  For insomnia-improving Trazodone 25 to 50 mg p.o. nightly as needed  Dementia-improving Continue to follow-up with Dr. Starleen Blue.  Follow-up in clinic in 3 months or sooner if needed.  Appointment scheduled for August 14 at 10 AM.  I have spent atleast 15 minutes non face to face with patient today. More than 50 % of the time was spent for psychoeducation and supportive psychotherapy and care coordination.  This note was generated in part or whole with voice recognition software. Voice recognition is usually quite accurate but there are transcription errors that can and very often do occur. I apologize for any typographical errors that were not detected and corrected.        Ursula Alert, MD 01/06/2019, 12:49 PM

## 2019-03-25 ENCOUNTER — Other Ambulatory Visit: Payer: Self-pay | Admitting: Psychiatry

## 2019-03-25 DIAGNOSIS — F330 Major depressive disorder, recurrent, mild: Secondary | ICD-10-CM

## 2019-04-08 ENCOUNTER — Ambulatory Visit (INDEPENDENT_AMBULATORY_CARE_PROVIDER_SITE_OTHER): Payer: Medicare HMO | Admitting: Psychiatry

## 2019-04-08 ENCOUNTER — Other Ambulatory Visit: Payer: Self-pay

## 2019-04-08 ENCOUNTER — Encounter: Payer: Self-pay | Admitting: Psychiatry

## 2019-04-08 DIAGNOSIS — G309 Alzheimer's disease, unspecified: Secondary | ICD-10-CM | POA: Diagnosis not present

## 2019-04-08 DIAGNOSIS — F3341 Major depressive disorder, recurrent, in partial remission: Secondary | ICD-10-CM | POA: Diagnosis not present

## 2019-04-08 DIAGNOSIS — F0280 Dementia in other diseases classified elsewhere without behavioral disturbance: Secondary | ICD-10-CM | POA: Diagnosis not present

## 2019-04-08 DIAGNOSIS — F5105 Insomnia due to other mental disorder: Secondary | ICD-10-CM | POA: Insufficient documentation

## 2019-04-08 DIAGNOSIS — F330 Major depressive disorder, recurrent, mild: Secondary | ICD-10-CM | POA: Insufficient documentation

## 2019-04-08 DIAGNOSIS — F0390 Unspecified dementia without behavioral disturbance: Secondary | ICD-10-CM | POA: Insufficient documentation

## 2019-04-08 MED ORDER — TRAZODONE HCL 50 MG PO TABS: 25.0000 mg | ORAL_TABLET | Freq: Every evening | ORAL | 0 refills | Status: DC | PRN

## 2019-04-08 NOTE — Progress Notes (Signed)
Virtual Visit via Telephone Note  I connected with Heather [name] on 04/08/19 at 10:00 AM EDT by telephone and verified that I am speaking with the correct person using two identifiers.   I discussed the limitations, risks, security and privacy concerns of performing an evaluation and management service by telephone and the availability of in person appointments. I also discussed with the patient that there may be a patient responsible charge related to this service. The patient expressed understanding and agreed to proceed.    I discussed the assessment and treatment plan with the patient. The patient was provided an opportunity to ask questions and all were answered. The patient agreed with the plan and demonstrated an understanding of the instructions.   The patient was advised to call back or seek an in-person evaluation if the symptoms worsen or if the condition fails to improve as anticipated.   [facility] MD OP Progress Note  04/08/2019 12:24 PM [name]  MRN:  [id number]  Chief Complaint:  Chief Complaint    Follow-up     HPI: Heather [name] is a 68 year old female, lives in [place], married, has a history of MDD, insomnia, dementia, CAD, tobacco use disorder, PAD, diabetes melitis, neuropathy was evaluated by phone today.  Patient today reports she is currently doing well with regards to her mood symptoms.  She denies any significant depression or anxiety symptoms.  She is compliant on medications.  She denies side effects to Lexapro.  She reports sleep is good.  She denies any side effects to trazodone.  Patient reports her husband is currently recovering from the surgery well.  She and her family are currently staying safe from the COVID-19.  She continues to work on cutting back on smoking.  Patient denies any other concerns today. Visit Diagnosis:    ICD-10-CM   1. MDD (major depressive disorder), recurrent, in partial remission ([place])  F33.41   2. Insomnia due to mental condition   F51.05 traZODone (DESYREL) 50 MG tablet  3. Major neurocognitive disorder due to Alzheimer's disease, probable, without behavioral disturbance ([place])  G30.9    F02.80     Past Psychiatric History: I have reviewed past psychiatric history from my progress note on 10/19/2018.  Past trials of Prozac, Wellbutrin, Seroquel, risperidone.  Past Medical History:  Past Medical History:  Diagnosis Date  . Anxiety   . Bipolar disorder ([place])   . Cancer ([place])    skin  . Depression   . Diabetes mellitus   . Diabetes mellitus, type II ([place])   . History of artificial heart valve   . Schizophrenia ([place])   . Vertigo     Past Surgical History:  Procedure Laterality Date  . ABDOMINAL HYSTERECTOMY    . BACK SURGERY    . CARDIAC VALVE REPLACEMENT    . CHOLECYSTECTOMY    . HEMORRHOID SURGERY    . TUBAL LIGATION      Family Psychiatric History: I have reviewed family psychiatric history from my progress note on 10/19/2018.  Family History:  Family History  Problem Relation Age of Onset  . Mental illness Mother   . Breast cancer Neg Hx     Social History: I have reviewed social history from my progress note on 10/19/2018. Social History   Socioeconomic History  . Marital status: Married    Spouse name: rowland  . Number of children: 2  . Years of education: Not on file  . Highest education level: High school graduate  Occupational History  . Not  on file  Social Needs  . Financial resource strain: Somewhat hard  . Food insecurity    Worry: Often true    Inability: Often true  . Transportation needs    Medical: No    Non-medical: No  Tobacco Use  . Smoking status: Current Every Day Smoker    Packs/day: 1.50    Types: Cigarettes  . Smokeless tobacco: Never Used  Substance and Sexual Activity  . Alcohol use: Yes    Alcohol/week: 1.0 - 2.0 standard drinks    Types: 1 - 2 Cans of beer per week  . Drug use: Not Currently  . Sexual activity: Not Currently  Lifestyle  . Physical  activity    Days per week: 0 days    Minutes per session: 0 min  . Stress: Not on file  Relationships  . Social Herbalist on phone: Not on file    Gets together: Not on file    Attends religious service: More than 4 times per year    Active member of club or organization: No    Attends meetings of clubs or organizations: Never    Relationship status: Married  Other Topics Concern  . Not on file  Social History Narrative  . Not on file    Allergies:  Allergies  Allergen Reactions  . Iodinated Diagnostic Agents Hives  . Sulfa Antibiotics   . Shellfish Allergy Rash and Other (See Comments)    Scallops specifically cause VOMITING Scallops specifically cause VOMITING     Metabolic Disorder Labs: No results found for: HGBA1C, MPG No results found for: PROLACTIN No results found for: CHOL, TRIG, HDL, CHOLHDL, VLDL, LDLCALC No results found for: TSH  Therapeutic Level Labs: No results found for: LITHIUM No results found for: VALPROATE No components found for:  CBMZ  Current Medications: Current Outpatient Medications  Medication Sig Dispense Refill  . ACCU-CHEK AVIVA PLUS test strip     . Accu-Chek Softclix Lancets lancets     . Alcohol Swabs (B-D SINGLE USE SWABS REGULAR) PADS     . aspirin EC 81 MG tablet Take 81 mg by mouth daily.    [name] atorvastatin (LIPITOR) 80 MG tablet     . Blood Glucose Calibration (ACCU-CHEK AVIVA) SOLN     . Blood Glucose Monitoring Suppl (ACCU-CHEK AVIVA PLUS) w/Device KIT     . chlorhexidine (PERIDEX) 0.12 % solution Use as directed 15 mLs in the mouth or throat 2 (two) times daily.    . cholecalciferol (VITAMIN D) 1000 units tablet Take 1,000 Units by mouth daily. Takes 2000 units daily.    . clopidogrel (PLAVIX) 75 MG tablet     . cyclobenzaprine (FLEXERIL) 10 MG tablet     . donepezil (ARICEPT) 5 MG tablet Take 5 mg by mouth at bedtime.    [name] escitalopram (LEXAPRO) 10 MG tablet TAKE 1 TABLET EVERY DAY 90 tablet 0  . fluticasone  (FLONASE) 50 MCG/ACT nasal spray     . Fluticasone-Salmeterol (ADVAIR) 250-50 MCG/DOSE AEPB     . furosemide (LASIX) 20 MG tablet     . gabapentin (NEURONTIN) 300 MG capsule Take 300 mg by mouth 3 (three) times daily.    . Insulin Aspart FlexPen 100 UNIT/ML SOPN     . insulin glargine (LANTUS) 100 UNIT/ML injection Inject 40 Units into the skin at bedtime.    . insulin lispro (HUMALOG KWIKPEN) 100 UNIT/ML KwikPen Inject into the skin.    . Insulin Syringe-Needle U-100 (MONOJECT  INS SYR 1CC/30G) 30G X 5/16" 1 ML MISC     . metFORMIN (GLUCOPHAGE) 1000 MG tablet Take 1,000 mg by mouth 2 (two) times daily with a meal.    . metoprolol succinate (TOPROL-XL) 25 MG 24 hr tablet     . Multiple Vitamins-Minerals (WOMENS MULTI VITAMIN & MINERAL PO) Take 1 tablet by mouth every morning.    . traZODone (DESYREL) 50 MG tablet Take 0.5-1 tablets (25-50 mg total) by mouth at bedtime as needed for sleep. 90 tablet 0   No current facility-administered medications for this visit.      Musculoskeletal: Strength & Muscle Tone: UTA Gait & Station: UTA Patient leans: N/A  Psychiatric Specialty Exam: Review of Systems  Psychiatric/Behavioral: The patient is not nervous/anxious.   All other systems reviewed and are negative.   There were no vitals taken for this visit.There is no height or weight on file to calculate BMI.  General Appearance: UTA  Eye Contact:  UTA  Speech:  Clear and Coherent  Volume:  Normal  Mood:  Euthymic  Affect:  UTA  Thought Process:  Goal Directed and Descriptions of Associations: Intact  Orientation:  Full (Time, Place, and Person)  Thought Content: Logical   Suicidal Thoughts:  No  Homicidal Thoughts:  No  Memory:  Immediate;   Fair Recent;   Fair Remote;   Fair  Judgement:  Fair  Insight:  Fair  Psychomotor Activity:  Normal  Concentration:  Concentration: Fair and Attention Span: Fair  Recall:  [employer] of Knowledge: Fair  Language: Fair  Akathisia:  No   Handed:  Right  AIMS (if indicated): Denies tremors, rigidity, stiffness  Assets:  Communication Skills Desire for Improvement Social Support  ADL's:  Intact  Cognition: WNL  Sleep:  Fair   Screenings: PHQ2-9     Patient Outreach Telephone from 03/19/2017 in [facility]  PHQ-2 Total Score  2  PHQ-9 Total Score  8       Assessment and Plan: Yanelis is a 68 year old Caucasian female, married, on disability, lives in [place], has a history of depression, dementia, diabetes melitis, coronary artery disease, aortic valve replacement was evaluated by phone today.  Patient is currently making progress on the current medication regimen.  Plan as noted below.  Plan For MDD-in remission Lexapro 10 mg p.o. daily  For insomnia-stable Trazodone 25-50 mg p.o. nightly as needed  For dementia-improving Continue to work with neurology.  For tobacco use disorder-improving Provided smoking cessation counseling.  Follow-up in clinic in 4 to 5 months or sooner if needed.  December 30 at 10 AM  I have spent atleast 15 minutes non face to face with patient today. More than 50 % of the time was spent for psychoeducation and supportive psychotherapy and care coordination.  This note was generated in part or whole with voice recognition software. Voice recognition is usually quite accurate but there are transcription errors that can and very often do occur. I apologize for any typographical errors that were not detected and corrected.       Ursula Alert, MD 04/08/2019, 12:24 PM

## 2019-04-11 ENCOUNTER — Other Ambulatory Visit: Payer: Self-pay | Admitting: Psychiatry

## 2019-04-11 DIAGNOSIS — F5105 Insomnia due to other mental disorder: Secondary | ICD-10-CM

## 2019-06-03 ENCOUNTER — Other Ambulatory Visit: Payer: Self-pay | Admitting: Family Medicine

## 2019-06-03 DIAGNOSIS — Z1231 Encounter for screening mammogram for malignant neoplasm of breast: Secondary | ICD-10-CM

## 2019-06-03 DIAGNOSIS — Z13820 Encounter for screening for osteoporosis: Secondary | ICD-10-CM

## 2019-06-06 ENCOUNTER — Other Ambulatory Visit: Payer: Self-pay | Admitting: Psychiatry

## 2019-06-06 DIAGNOSIS — F330 Major depressive disorder, recurrent, mild: Secondary | ICD-10-CM

## 2019-06-23 ENCOUNTER — Other Ambulatory Visit: Payer: Self-pay | Admitting: Psychiatry

## 2019-06-23 DIAGNOSIS — F5105 Insomnia due to other mental disorder: Secondary | ICD-10-CM

## 2019-08-11 ENCOUNTER — Other Ambulatory Visit: Payer: Self-pay | Admitting: Psychiatry

## 2019-08-11 DIAGNOSIS — F330 Major depressive disorder, recurrent, mild: Secondary | ICD-10-CM

## 2019-08-24 ENCOUNTER — Ambulatory Visit (INDEPENDENT_AMBULATORY_CARE_PROVIDER_SITE_OTHER): Payer: Medicare HMO | Admitting: Psychiatry

## 2019-08-24 ENCOUNTER — Other Ambulatory Visit: Payer: Self-pay

## 2019-08-24 ENCOUNTER — Other Ambulatory Visit: Payer: Self-pay | Admitting: Psychiatry

## 2019-08-24 ENCOUNTER — Encounter: Payer: Self-pay | Admitting: Psychiatry

## 2019-08-24 DIAGNOSIS — F3342 Major depressive disorder, recurrent, in full remission: Secondary | ICD-10-CM | POA: Diagnosis not present

## 2019-08-24 DIAGNOSIS — F5105 Insomnia due to other mental disorder: Secondary | ICD-10-CM | POA: Diagnosis not present

## 2019-08-24 DIAGNOSIS — F0150 Vascular dementia without behavioral disturbance: Secondary | ICD-10-CM

## 2019-08-24 DIAGNOSIS — F0390 Unspecified dementia without behavioral disturbance: Secondary | ICD-10-CM

## 2019-08-24 DIAGNOSIS — F3341 Major depressive disorder, recurrent, in partial remission: Secondary | ICD-10-CM | POA: Insufficient documentation

## 2019-08-24 MED ORDER — TRAZODONE HCL 50 MG PO TABS: ORAL_TABLET | ORAL | 1 refills | Status: DC

## 2019-08-24 NOTE — Progress Notes (Signed)
Virtual Visit via Telephone Note  I connected with Heather [name] on 08/24/19 at 10:00 AM EST by telephone and verified that I am speaking with the correct person using two identifiers.   I discussed the limitations, risks, security and privacy concerns of performing an evaluation and management service by telephone and the availability of in person appointments. I also discussed with the patient that there may be a patient responsible charge related to this service. The patient expressed understanding and agreed to proceed.     I discussed the assessment and treatment plan with the patient. The patient was provided an opportunity to ask questions and all were answered. The patient agreed with the plan and demonstrated an understanding of the instructions.   The patient was advised to call back or seek an in-person evaluation if the symptoms worsen or if the condition fails to improve as anticipated.   [facility] MD OP Progress Note  08/24/2019 12:06 PM Heather [name]  MRN:  [id number]  Chief Complaint:  Chief Complaint    Follow-up     HPI: Heather [name] is a 68 year old female, lives in [name], married, has a history of MDD, insomnia, dementia, CAD, tobacco use disorder, PAD, diabetes melitis, neuropathy was evaluated by phone today.  Patient preferred to do a phone call.  Patient today reports she is currently staying safe from the COVID-19.  She is following all the precautions.  So far she and her family are doing okay.  She denies any significant depression or anxiety symptoms.  She continues to be compliant on Lexapro.  She denies side effects.  She reports she and her husband celebrated Christmas and Thanksgiving at their own home rather than going to their daughters.  She reports she just wanted to be safe.  She reports she currently keeps herself busy taking care of her hens.  She enjoys it.  She denies any other concerns.  She reports she is cutting back on smoking cigarettes.   Visit  Diagnosis:    ICD-10-CM   1. MDD (major depressive disorder), recurrent, in full remission ([place])  F33.42   2. Insomnia due to mental condition  F51.05 traZODone (DESYREL) 50 MG tablet  3. Major neurocognitive disorder due to Alzheimer's disease, probable, without behavioral disturbance ([place])  F01.50     Past Psychiatric History: I have reviewed past psychiatric history from my progress note on 10/19/2018.  Past trials of Prozac, Wellbutrin, Seroquel, risperidone.  Past Medical History:  Past Medical History:  Diagnosis Date  . Anxiety   . Bipolar disorder ([place])   . Cancer ([place])    skin  . Depression   . Diabetes mellitus   . Diabetes mellitus, type II ([place])   . History of artificial heart valve   . Schizophrenia ([place])   . Vertigo     Past Surgical History:  Procedure Laterality Date  . ABDOMINAL HYSTERECTOMY    . BACK SURGERY    . CARDIAC VALVE REPLACEMENT    . CHOLECYSTECTOMY    . HEMORRHOID SURGERY    . TUBAL LIGATION      Family Psychiatric History: I have reviewed family psychiatric history from my progress note on 10/19/2018.  Family History:  Family History  Problem Relation Age of Onset  . Mental illness Mother   . Breast cancer Neg Hx     Social History: Reviewed social history from my progress note on 10/19/2018. Social History   Socioeconomic History  . Marital status: Married    Spouse name: rowland  .  Number of children: 2  . Years of education: Not on file  . Highest education level: High school graduate  Occupational History  . Not on file  Tobacco Use  . Smoking status: Current Every Day Smoker    Packs/day: 1.50    Types: Cigarettes  . Smokeless tobacco: Never Used  Substance and Sexual Activity  . Alcohol use: Yes    Alcohol/week: 1.0 - 2.0 standard drinks    Types: 1 - 2 Cans of beer per week  . Drug use: Not Currently  . Sexual activity: Not Currently  Other Topics Concern  . Not on file  Social History Narrative  . Not on file    Social Determinants of Health   Financial Resource Strain: Medium Risk  . Difficulty of Paying Living Expenses: Somewhat hard  Food Insecurity: Food Insecurity Present  . Worried About Charity fundraiser in the Last Year: Often true  . Ran Out of Food in the Last Year: Often true  Transportation Needs: No Transportation Needs  . Lack of Transportation (Medical): No  . Lack of Transportation (Non-Medical): No  Physical Activity: Inactive  . Days of Exercise per Week: 0 days  . Minutes of Exercise per Session: 0 min  Stress:   . Feeling of Stress : Not on file  Social Connections: Unknown  . Frequency of Communication with Friends and Family: Not on file  . Frequency of Social Gatherings with Friends and Family: Not on file  . Attends Religious Services: More than 4 times per year  . Active Member of Clubs or Organizations: No  . Attends Archivist Meetings: Never  . Marital Status: Married    Allergies:  Allergies  Allergen Reactions  . Iodinated Diagnostic Agents Hives  . Sulfa Antibiotics   . Shellfish Allergy Rash and Other (See Comments)    Scallops specifically cause VOMITING Scallops specifically cause VOMITING     Metabolic Disorder Labs: No results found for: HGBA1C, MPG No results found for: PROLACTIN No results found for: CHOL, TRIG, HDL, CHOLHDL, VLDL, LDLCALC No results found for: TSH  Therapeutic Level Labs: No results found for: LITHIUM No results found for: VALPROATE No components found for:  CBMZ  Current Medications: Current Outpatient Medications  Medication Sig Dispense Refill  . ACCU-CHEK AVIVA PLUS test strip     . Accu-Chek Softclix Lancets lancets     . albuterol (PROVENTIL HFA) 108 (90 Base) MCG/ACT inhaler Inhale into the lungs.    . Alcohol Swabs (B-D SINGLE USE SWABS REGULAR) PADS     . aspirin EC 81 MG tablet Take 81 mg by mouth daily.    [name] atorvastatin (LIPITOR) 80 MG tablet     . Blood Glucose Calibration (ACCU-CHEK  AVIVA) SOLN     . Blood Glucose Monitoring Suppl (ACCU-CHEK AVIVA PLUS) w/Device KIT     . chlorhexidine (PERIDEX) 0.12 % solution Use as directed 15 mLs in the mouth or throat 2 (two) times daily.    . cholecalciferol (VITAMIN D) 1000 units tablet Take 1,000 Units by mouth daily. Takes 2000 units daily.    . clopidogrel (PLAVIX) 75 MG tablet     . cyclobenzaprine (FLEXERIL) 10 MG tablet     . donepezil (ARICEPT) 5 MG tablet Take 5 mg by mouth at bedtime.    [name] escitalopram (LEXAPRO) 10 MG tablet TAKE 1 TABLET EVERY DAY 90 tablet 0  . fluticasone (FLONASE) 50 MCG/ACT nasal spray     . Fluticasone-Salmeterol (ADVAIR) 250-50  MCG/DOSE AEPB     . furosemide (LASIX) 20 MG tablet     . gabapentin (NEURONTIN) 600 MG tablet     . Insulin Aspart FlexPen 100 UNIT/ML SOPN     . insulin glargine (LANTUS) 100 UNIT/ML injection Inject 40 Units into the skin at bedtime.    . insulin lispro (HUMALOG KWIKPEN) 100 UNIT/ML KwikPen Inject into the skin.    . Insulin Syringe-Needle U-100 (MONOJECT INS SYR 1CC/30G) 30G X 5/16" 1 ML MISC     . metoprolol succinate (TOPROL-XL) 25 MG 24 hr tablet     . Multiple Vitamins-Minerals (WOMENS MULTI VITAMIN & MINERAL PO) Take 1 tablet by mouth every morning.    . traZODone (DESYREL) 50 MG tablet TAKE 1/2 TO 1 TABLET AT BEDTIME AS NEEDED FOR SLEEP 90 tablet 1  . metFORMIN (GLUCOPHAGE) 1000 MG tablet Take 1,000 mg by mouth 2 (two) times daily with a meal.     No current facility-administered medications for this visit.     Musculoskeletal: Strength & Muscle Tone: UTA Gait & Station: normal Patient leans: N/A  Psychiatric Specialty Exam: Review of Systems  Psychiatric/Behavioral: Negative for agitation, behavioral problems, confusion, decreased concentration, dysphoric mood, hallucinations, self-injury, sleep disturbance and suicidal ideas. The patient is not nervous/anxious and is not hyperactive.   All other systems reviewed and are negative.   There were no vitals  taken for this visit.There is no height or weight on file to calculate BMI.  General Appearance: UTA  Eye Contact:  UTA  Speech:  Clear and Coherent  Volume:  Normal  Mood:  Euthymic  Affect:  UTA  Thought Process:  Goal Directed and Descriptions of Associations: Intact  Orientation:  Full (Time, Place, and Person)  Thought Content: Logical   Suicidal Thoughts:  No  Homicidal Thoughts:  No  Memory:  Immediate;   Fair Recent;   Fair Remote;   Fair  Judgement:  Fair  Insight:  Fair  Psychomotor Activity:  UTA  Concentration:  Concentration: Fair and Attention Span: Fair  Recall:  [employer] of Knowledge: Fair  Language: Fair  Akathisia:  No  Handed:  Right  AIMS (if indicated): Denies tremors, rigidity  Assets:  Communication Skills Desire for Improvement Housing Intimacy Social Support  ADL's:  Intact  Cognition: WNL  Sleep:  Fair   Screenings: PHQ2-9     Patient Outreach Telephone from 03/19/2017 in [facility]  PHQ-2 Total Score  2  PHQ-9 Total Score  8       Assessment and Plan: Rainee is a 68 year old Caucasian female, married, on disability, lives in [place], has a history of depression, dementia, diabetes, coronary artery disease, aortic valve replacement was evaluated by phone today.  Patient is currently doing well on the current medication regimen.  Plan as noted below.  Plan MDD in remission Lexapro 10 mg p.o. daily  Insomnia-stable Trazodone 25 to 50 mg p.o. nightly as needed  Dementia-improving Continue to work with neurology. She is on Aricept.  Tobacco use disorder-improving Provided smoking cessation counseling.   Follow-up in clinic in 5 months or sooner if needed.  May 24 at 4 PM  I have spent atleast 10 minutes non face to face with patient today. More than 50 % of the time was spent for psychoeducation and supportive psychotherapy and care coordination. This note was generated in part or whole with voice recognition software.  Voice recognition is usually quite accurate but there are transcription errors that can and  very often do occur. I apologize for any typographical errors that were not detected and corrected.      Ursula Alert, MD 08/24/2019, 12:06 PM

## 2019-09-13 ENCOUNTER — Other Ambulatory Visit: Payer: Self-pay

## 2019-10-14 ENCOUNTER — Other Ambulatory Visit: Payer: Self-pay | Admitting: Psychiatry

## 2019-10-14 DIAGNOSIS — F330 Major depressive disorder, recurrent, mild: Secondary | ICD-10-CM

## 2019-12-15 ENCOUNTER — Other Ambulatory Visit (INDEPENDENT_AMBULATORY_CARE_PROVIDER_SITE_OTHER): Payer: Self-pay | Admitting: Vascular Surgery

## 2019-12-15 ENCOUNTER — Ambulatory Visit (INDEPENDENT_AMBULATORY_CARE_PROVIDER_SITE_OTHER): Payer: Medicare HMO

## 2019-12-15 ENCOUNTER — Ambulatory Visit (INDEPENDENT_AMBULATORY_CARE_PROVIDER_SITE_OTHER): Payer: Medicare HMO | Admitting: Vascular Surgery

## 2019-12-15 ENCOUNTER — Encounter (INDEPENDENT_AMBULATORY_CARE_PROVIDER_SITE_OTHER): Payer: Self-pay

## 2019-12-15 ENCOUNTER — Other Ambulatory Visit: Payer: Self-pay

## 2019-12-15 ENCOUNTER — Encounter (INDEPENDENT_AMBULATORY_CARE_PROVIDER_SITE_OTHER): Payer: Self-pay | Admitting: Vascular Surgery

## 2019-12-15 VITALS — BP 181/74 | HR 75 | Resp 16 | Ht 59.0 in | Wt 124.0 lb

## 2019-12-15 DIAGNOSIS — I10 Essential (primary) hypertension: Secondary | ICD-10-CM

## 2019-12-15 DIAGNOSIS — I739 Peripheral vascular disease, unspecified: Secondary | ICD-10-CM

## 2019-12-15 DIAGNOSIS — IMO0002 Reserved for concepts with insufficient information to code with codable children: Secondary | ICD-10-CM

## 2019-12-15 DIAGNOSIS — I6523 Occlusion and stenosis of bilateral carotid arteries: Secondary | ICD-10-CM

## 2019-12-15 DIAGNOSIS — I25118 Atherosclerotic heart disease of native coronary artery with other forms of angina pectoris: Secondary | ICD-10-CM

## 2019-12-15 DIAGNOSIS — E785 Hyperlipidemia, unspecified: Secondary | ICD-10-CM | POA: Insufficient documentation

## 2019-12-15 DIAGNOSIS — Z794 Long term (current) use of insulin: Secondary | ICD-10-CM

## 2019-12-15 DIAGNOSIS — I70213 Atherosclerosis of native arteries of extremities with intermittent claudication, bilateral legs: Secondary | ICD-10-CM | POA: Diagnosis not present

## 2019-12-15 DIAGNOSIS — E782 Mixed hyperlipidemia: Secondary | ICD-10-CM

## 2019-12-15 DIAGNOSIS — I6529 Occlusion and stenosis of unspecified carotid artery: Secondary | ICD-10-CM | POA: Insufficient documentation

## 2019-12-15 DIAGNOSIS — J439 Emphysema, unspecified: Secondary | ICD-10-CM

## 2019-12-15 DIAGNOSIS — E1165 Type 2 diabetes mellitus with hyperglycemia: Secondary | ICD-10-CM

## 2019-12-15 NOTE — Progress Notes (Signed)
MRN : [id number]  Heather [name] is a 69 y.o. (11-28-1950) female who presents with chief complaint of  Chief Complaint  Patient presents with  . New Patient (Initial Visit)    ref. headrick. pvd  .  History of Present Illness:    The patient is seen for evaluation of painful lower extremities and diminished pulses. Patient notes the pain is always associated with activity and is very consistent day today. Typically, the pain occurs at less than one block, progress is as activity continues to the point that the patient must stop walking. Resting including standing still for several minutes allowed resumption of the activity and the ability to walk a similar distance before stopping again. Uneven terrain and inclined shorten the distance. The pain has been progressive over the past several years. The patient states the inability to walk is now having a profound negative impact on quality of life and daily activities.  She has a history of prior intervention and "stents" which at one point was complicated with a left groin pseudoaneurysm.  The patient denies rest pain or dangling of an extremity off the side of the bed during the night for relief. No open wounds or sores at this time.  No history of back problems or DJD of the lumbar sacral spine.   The patient denies changes in claudication symptoms or new rest pain symptoms.  No new ulcers or wounds of the foot.  The patient's blood pressure has been stable and relatively well controlled. The patient denies amaurosis fugax or recent TIA symptoms. There are no recent neurological changes noted. The patient denies history of DVT, PE or superficial thrombophlebitis. The patient denies recent episodes of angina or shortness of breath.   Current Meds  Medication Sig  . ACCU-CHEK AVIVA PLUS test strip   . Accu-Chek Softclix Lancets lancets   . albuterol (PROVENTIL HFA) 108 (90 Base) MCG/ACT inhaler Inhale into the lungs.  . Alcohol Swabs  (B-D SINGLE USE SWABS REGULAR) PADS   . aspirin EC 81 MG tablet Take 81 mg by mouth daily.  [name] atorvastatin (LIPITOR) 80 MG tablet   . Blood Glucose Calibration (ACCU-CHEK AVIVA) SOLN   . Blood Glucose Monitoring Suppl (ACCU-CHEK AVIVA PLUS) w/Device KIT   . chlorhexidine (PERIDEX) 0.12 % solution Use as directed 15 mLs in the mouth or throat 2 (two) times daily.  . cholecalciferol (VITAMIN D) 1000 units tablet Take 1,000 Units by mouth daily. Takes 2000 units daily.  . clopidogrel (PLAVIX) 75 MG tablet   . cyclobenzaprine (FLEXERIL) 10 MG tablet   . donepezil (ARICEPT) 5 MG tablet Take 5 mg by mouth at bedtime.  [name] escitalopram (LEXAPRO) 10 MG tablet TAKE 1 TABLET EVERY DAY  . fluticasone (FLONASE) 50 MCG/ACT nasal spray   . Fluticasone-Salmeterol (ADVAIR) 250-50 MCG/DOSE AEPB   . furosemide (LASIX) 20 MG tablet   . gabapentin (NEURONTIN) 600 MG tablet   . Insulin Aspart FlexPen 100 UNIT/ML SOPN   . insulin glargine (LANTUS) 100 UNIT/ML injection Inject 40 Units into the skin at bedtime.  . insulin lispro (HUMALOG KWIKPEN) 100 UNIT/ML KwikPen Inject into the skin.  . Insulin Syringe-Needle U-100 (MONOJECT INS SYR 1CC/30G) 30G X 5/16" 1 ML MISC   . metoprolol succinate (TOPROL-XL) 25 MG 24 hr tablet   . Multiple Vitamins-Minerals (WOMENS MULTI VITAMIN & MINERAL PO) Take 1 tablet by mouth every morning.  . pregabalin (LYRICA) 50 MG capsule Take 50 mg by mouth 3 (three) times daily.  [name]  traZODone (DESYREL) 50 MG tablet TAKE 1/2 TO 1 TABLET AT BEDTIME AS NEEDED FOR SLEEP    Past Medical History:  Diagnosis Date  . Anxiety   . Bipolar disorder ([place])   . Cancer ([place])    skin  . Depression   . Diabetes mellitus   . Diabetes mellitus, type II ([place])   . History of artificial heart valve   . Schizophrenia ([place])   . Vertigo     Past Surgical History:  Procedure Laterality Date  . ABDOMINAL HYSTERECTOMY    . BACK SURGERY    . CARDIAC VALVE REPLACEMENT    . CHOLECYSTECTOMY    .  HEMORRHOID SURGERY    . TUBAL LIGATION      Social History Social History   Tobacco Use  . Smoking status: Current Every Day Smoker    Packs/day: 1.50    Types: Cigarettes  . Smokeless tobacco: Never Used  Substance Use Topics  . Alcohol use: Yes    Alcohol/week: 1.0 - 2.0 standard drinks    Types: 1 - 2 Cans of beer per week  . Drug use: Not Currently    Family History Family History  Problem Relation Age of Onset  . Mental illness Mother   . Breast cancer Neg Hx   No family history of bleeding/clotting disorders, porphyria or autoimmune disease   Allergies  Allergen Reactions  . Iodinated Diagnostic Agents Hives  . Sulfa Antibiotics   . Shellfish Allergy Rash and Other (See Comments)    Scallops specifically cause VOMITING Scallops specifically cause VOMITING      REVIEW OF SYSTEMS (Negative unless checked)  Constitutional: '[]' Weight loss  '[]' Fever  '[]' Chills Cardiac: '[]' Chest pain   '[]' Chest pressure   '[]' Palpitations   '[]' Shortness of breath when laying flat   '[]' Shortness of breath with exertion. Vascular:  '[x]' Pain in legs with walking   '[x]' Pain in legs at rest  '[]' History of DVT   '[]' Phlebitis   '[]' Swelling in legs   '[]' Varicose veins   '[]' Non-healing ulcers Pulmonary:   '[]' Uses home oxygen   '[]' Productive cough   '[]' Hemoptysis   '[]' Wheeze  '[x]' COPD   '[]' Asthma Neurologic:  '[]' Dizziness   '[]' Seizures   '[]' History of stroke   '[]' History of TIA  '[]' Aphasia   '[]' Vissual changes   '[]' Weakness or numbness in arm   '[]' Weakness or numbness in leg Musculoskeletal:   '[]' Joint swelling   '[x]' Joint pain   '[]' Low back pain Hematologic:  '[]' Easy bruising  '[]' Easy bleeding   '[]' Hypercoagulable state   '[]' Anemic Gastrointestinal:  '[]' Diarrhea   '[]' Vomiting  '[x]' Gastroesophageal reflux/heartburn   '[]' Difficulty swallowing. Genitourinary:  '[]' Chronic kidney disease   '[]' Difficult urination  '[]' Frequent urination   '[]' Blood in urine Skin:  '[]' Rashes   '[]' Ulcers  Psychological:  '[]' History of anxiety   '[]'  History of major  depression.  Physical Examination  Vitals:   12/15/19 1003  BP: (!) 181/74  Pulse: 75  Resp: 16  Weight: 124 lb (56.2 kg)  Height: '4\' 11"'  (1.499 m)   Body mass index is 25.04 kg/m. Gen: WD/WN, NAD Head: [place]/AT, No temporalis wasting.  Ear/Nose/Throat: Hearing grossly intact, nares w/o erythema or drainage, poor dentition Eyes: PER, EOMI, sclera nonicteric.  Neck: Supple, no masses.  No bruit or JVD.  Pulmonary:  Good air movement, clear to auscultation bilaterally, no use of accessory muscles.  Cardiac: RRR, normal S1, S2, + Murmurs. Vascular: feet cool to touch; extensive scar tissue left groin Vessel Right Left  Radial Palpable Palpable  Carotid Palpable Palpable  Femoral Palpable Weakly Palpable  Popliteal Not Palpable Not Palpable  PT Not Palpable Not Palpable  DP Not Palpable Not Palpable  Gastrointestinal: soft, non-distended. No guarding/no peritoneal signs.  Musculoskeletal: M/S 5/5 throughout.  No deformity or atrophy.  Neurologic: CN 2-12 intact. Pain and light touch intact in extremities.  Symmetrical.  Speech is fluent. Motor exam as listed above. Psychiatric: Judgment intact, Mood & affect appropriate for pt's clinical situation. Dermatologic: No rashes or ulcers noted.  No changes consistent with cellulitis. Lymph : No Cervical lymphadenopathy, no lichenification or skin changes of chronic lymphedema.  CBC Lab Results  Component Value Date   WBC 7.6 06/20/2012   HGB 14.3 06/20/2012   HCT 42.9 06/20/2012   MCV 91 06/20/2012   PLT 357 06/20/2012    BMET    Component Value Date/Time   NA 135 (L) 06/20/2012 2127   K 4.0 06/20/2012 2127   CL 100 06/20/2012 2127   CO2 27 06/20/2012 2127   GLUCOSE 250 (H) 06/20/2012 2127   BUN 20 (H) 06/20/2012 2127   CREATININE 0.60 06/20/2012 2127   CALCIUM 8.8 06/20/2012 2127   GFRNONAA >60 06/20/2012 2127   GFRAA >60 06/20/2012 2127   CrCl cannot be calculated (Patient's most recent lab result is older than the  maximum 21 days allowed.).  COAG No results found for: INR, PROTIME  Radiology No results found.   Assessment/Plan 1. Atherosclerosis of native artery of both lower extremities with intermittent claudication (HCC)  Recommend:  The patient has evidence of atherosclerosis of the lower extremities with claudication.  The patient does voice lifestyle limiting changes at this point in time but is not yet ready to have further intervention.  Noninvasive studies do not suggest critical disease.  No invasive studies, angiography or surgery at this time  The patient should continue walking and begin a more formal exercise program.  The patient should continue antiplatelet therapy and aggressive treatment of the lipid abnormalities  No changes in the patient's medications at this time  She will follow up in three months to determine how well she is doing.   A total of 75 minutes was spent with this patient and greater than 50% was spent in counseling and coordination of care with the patient.  Discussion included the treatment options for vascular disease including indications for surgery and intervention.  Also discussed is the appropriate timing of treatment.  In addition medical therapy was discussed.  - VAS [place] LOWER EXTREMITY ARTERIAL DUPLEX; today - VAS [place] ABI WITH/WO TBI; today  2. Bilateral carotid artery stenosis Recommend:  Given the patient's asymptomatic subcritical stenosis no further invasive testing or surgery at this time.  Duplex ultrasound showed mild bilateral disease.  Continue antiplatelet therapy as prescribed Continue management of CAD, HTN and Hyperlipidemia Healthy heart diet,  encouraged exercise at least 4 times per week  - VAS US CAROTID; today  3. Coronary artery disease of native artery of native heart with stable angina pectoris (HCC) Continue cardiac and antihypertensive medications as already ordered and reviewed, no changes at this time.  Continue  statin as ordered and reviewed, no changes at this time  Nitrates PRN for chest pain   4. Hypertension, benign Continue antihypertensive medications as already ordered, these medications have been reviewed and there are no changes at this time.   5. Pulmonary emphysema, unspecified emphysema type ([place]) Continue pulmonary medications and aerosols as already ordered, these medications have been reviewed and there are no changes at this time.  6. Uncontrolled type 2 diabetes mellitus with insulin therapy ([place]) Continue hypoglycemic medications as already ordered, these medications have been reviewed and there are no changes at this time.  Hgb A1C to be monitored as already arranged by primary service   7. Mixed hyperlipidemia Continue statin as ordered and reviewed, no changes at this time    [name], MD  12/15/2019 10:47 AM

## 2019-12-20 ENCOUNTER — Other Ambulatory Visit: Payer: Self-pay | Admitting: Psychiatry

## 2019-12-20 DIAGNOSIS — F330 Major depressive disorder, recurrent, mild: Secondary | ICD-10-CM

## 2020-01-04 ENCOUNTER — Encounter (INDEPENDENT_AMBULATORY_CARE_PROVIDER_SITE_OTHER): Payer: Self-pay

## 2020-01-06 ENCOUNTER — Other Ambulatory Visit: Payer: Self-pay | Admitting: Psychiatry

## 2020-01-06 DIAGNOSIS — F5105 Insomnia due to other mental disorder: Secondary | ICD-10-CM

## 2020-01-14 ENCOUNTER — Emergency Department
Admission: EM | Admit: 2020-01-14 | Discharge: 2020-01-15 | Disposition: A | Discharge status: Home / Self Care | Payer: Self-pay | Attending: Emergency Medicine | Admitting: Emergency Medicine

## 2020-01-14 ENCOUNTER — Other Ambulatory Visit: Payer: Self-pay

## 2020-01-14 DIAGNOSIS — R21 Rash and other nonspecific skin eruption: Secondary | ICD-10-CM | POA: Insufficient documentation

## 2020-01-14 DIAGNOSIS — Z5321 Procedure and treatment not carried out due to patient leaving prior to being seen by health care provider: Secondary | ICD-10-CM | POA: Insufficient documentation

## 2020-01-14 NOTE — ED Triage Notes (Signed)
Patient reports noticed rash under right breast just prior to coming to ED.

## 2020-01-16 ENCOUNTER — Other Ambulatory Visit: Payer: Self-pay

## 2020-01-16 ENCOUNTER — Encounter: Payer: Self-pay | Admitting: Psychiatry

## 2020-01-16 ENCOUNTER — Ambulatory Visit (INDEPENDENT_AMBULATORY_CARE_PROVIDER_SITE_OTHER): Payer: Medicare HMO | Admitting: Psychiatry

## 2020-01-16 DIAGNOSIS — F5105 Insomnia due to other mental disorder: Secondary | ICD-10-CM

## 2020-01-16 DIAGNOSIS — F0150 Vascular dementia without behavioral disturbance: Secondary | ICD-10-CM | POA: Diagnosis not present

## 2020-01-16 DIAGNOSIS — F419 Anxiety disorder, unspecified: Secondary | ICD-10-CM | POA: Diagnosis not present

## 2020-01-16 DIAGNOSIS — F330 Major depressive disorder, recurrent, mild: Secondary | ICD-10-CM | POA: Diagnosis not present

## 2020-01-16 DIAGNOSIS — F3342 Major depressive disorder, recurrent, in full remission: Secondary | ICD-10-CM | POA: Insufficient documentation

## 2020-01-16 DIAGNOSIS — F0390 Unspecified dementia without behavioral disturbance: Secondary | ICD-10-CM

## 2020-01-16 MED ORDER — ESCITALOPRAM OXALATE 20 MG PO TABS: 20.0000 mg | ORAL_TABLET | Freq: Every day | ORAL | 0 refills | Status: DC

## 2020-01-16 NOTE — Progress Notes (Signed)
Provider Location : [facility] Patient Location : Home  Virtual Visit via Telephone Note  I connected with Heather [name] on 01/16/20 at  4:00 PM EDT by telephone and verified that I am speaking with the correct person using two identifiers.   I discussed the limitations, risks, security and privacy concerns of performing an evaluation and management service by telephone and the availability of in person appointments. I also discussed with the patient that there may be a patient responsible charge related to this service. The patient expressed understanding and agreed to proceed.     I discussed the assessment and treatment plan with the patient. The patient was provided an opportunity to ask questions and all were answered. The patient agreed with the plan and demonstrated an understanding of the instructions.   The patient was advised to call back or seek an in-person evaluation if the symptoms worsen or if the condition fails to improve as anticipated.   Heather [name] OP Progress Note  01/16/2020 5:59 PM Heather [name]  MRN:  [id number]  Chief Complaint:  Chief Complaint    Follow-up     HPI: Heather [name] is a 69 year old female, lives in [place], married, has a history of MDD, insomnia, dementia, CAD, tobacco use disorder, PAD, diabetes melitis, neuropathy was evaluated by phone today.  Patient preferred to do a phone call.  Patient today reports she is currently struggling with anxiety and depressive symptoms.  She struggles with nervousness, sadness, lack of motivation.  This has been getting worse since the past few weeks.  She reports she is compliant on her medications.  Denies side effects.  She reports sleep is okay.  She reports she has psychosocial stressors of worrying about her daughter who lives in [place].  She reports her daughter is currently struggling with her own mental health and that is a constant stressor for her.  Patient reports she has no way of helping her  daughter.  Patient denies any suicidality, homicidality or perceptual disturbances.  Patient does have history of memory problems however today was able to answer questions appropriately and appeared to be alert and oriented to person place and situation.  Visit Diagnosis:    ICD-10-CM   1. MDD (major depressive disorder), recurrent episode, mild (HCC)  F33.0 escitalopram (LEXAPRO) 20 MG tablet  2. Insomnia due to mental condition  F51.05   3. Anxiety disorder, unspecified type  F41.9 escitalopram (LEXAPRO) 20 MG tablet  4. Major neurocognitive disorder due to Alzheimer's disease, probable, without behavioral disturbance ([place])  F01.50     Past Psychiatric History: I have reviewed past psychiatric history from my progress note on 10/19/2018.  Past trials of Prozac, Wellbutrin, Seroquel, risperidone  Past Medical History:  Past Medical History:  Diagnosis Date  . Anxiety   . Bipolar disorder ([place])   . Cancer ([place])    skin  . Depression   . Diabetes mellitus   . Diabetes mellitus, type II ([place])   . History of artificial heart valve   . Schizophrenia ([place])   . Vertigo     Past Surgical History:  Procedure Laterality Date  . ABDOMINAL HYSTERECTOMY    . BACK SURGERY    . CARDIAC VALVE REPLACEMENT    . CHOLECYSTECTOMY    . HEMORRHOID SURGERY    . TUBAL LIGATION      Family Psychiatric History: I have reviewed family psychiatric history from my progress note on 10/19/2018  Family History:  Family History  Problem Relation Age of  Onset  . Mental illness Mother   . Breast cancer Neg Hx     Social History: Reviewed social history from my progress note on 10/19/2018 Social History   Socioeconomic History  . Marital status: Married    Spouse name: rowland  . Number of children: 2  . Years of education: Not on file  . Highest education level: High school graduate  Occupational History  . Not on file  Tobacco Use  . Smoking status: Current Every Day Smoker    Packs/day:  1.50    Types: Cigarettes  . Smokeless tobacco: Never Used  Substance and Sexual Activity  . Alcohol use: Yes    Alcohol/week: 1.0 - 2.0 standard drinks    Types: 1 - 2 Cans of beer per week  . Drug use: Not Currently  . Sexual activity: Not Currently  Other Topics Concern  . Not on file  Social History Narrative  . Not on file   Social Determinants of Health   Financial Resource Strain:   . Difficulty of Paying Living Expenses:   Food Insecurity:   . Worried About Charity fundraiser in the Last Year:   . Arboriculturist in the Last Year:   Transportation Needs:   . Film/video editor (Medical):   [name] Lack of Transportation (Non-Medical):   Physical Activity:   . Days of Exercise per Week:   . Minutes of Exercise per Session:   Stress:   . Feeling of Stress :   Social Connections:   . Frequency of Communication with Friends and Family:   . Frequency of Social Gatherings with Friends and Family:   . Attends Religious Services:   . Active Member of Clubs or Organizations:   . Attends Archivist Meetings:   [name] Marital Status:     Allergies:  Allergies  Allergen Reactions  . Iodinated Diagnostic Agents Hives  . Sulfa Antibiotics   . Shellfish Allergy Rash and Other (See Comments)    Scallops specifically cause VOMITING Scallops specifically cause VOMITING     Metabolic Disorder Labs: No results found for: HGBA1C, MPG No results found for: PROLACTIN No results found for: CHOL, TRIG, HDL, CHOLHDL, VLDL, LDLCALC No results found for: TSH  Therapeutic Level Labs: No results found for: LITHIUM No results found for: VALPROATE No components found for:  CBMZ  Current Medications: Current Outpatient Medications  Medication Sig Dispense Refill  . ACCU-CHEK AVIVA PLUS test strip     . Accu-Chek Softclix Lancets lancets     . albuterol (PROVENTIL HFA) 108 (90 Base) MCG/ACT inhaler Inhale into the lungs.    . Alcohol Swabs (B-D SINGLE USE SWABS REGULAR)  PADS     . aspirin EC 81 MG tablet Take 81 mg by mouth daily.    [name] atorvastatin (LIPITOR) 80 MG tablet     . Blood Glucose Calibration (ACCU-CHEK AVIVA) SOLN     . Blood Glucose Monitoring Suppl (ACCU-CHEK AVIVA PLUS) w/Device KIT     . cholecalciferol (VITAMIN D) 1000 units tablet Take 1,000 Units by mouth daily. Takes 2000 units daily.    . clopidogrel (PLAVIX) 75 MG tablet     . cyclobenzaprine (FLEXERIL) 10 MG tablet     . donepezil (ARICEPT) 5 MG tablet Take 5 mg by mouth at bedtime.    . fluticasone (FLONASE) 50 MCG/ACT nasal spray     . Fluticasone-Salmeterol (ADVAIR) 250-50 MCG/DOSE AEPB     . furosemide (LASIX) 20 MG tablet     .  Insulin Aspart FlexPen 100 UNIT/ML SOPN     . insulin glargine (LANTUS) 100 UNIT/ML injection Inject 40 Units into the skin at bedtime.    . insulin lispro (HUMALOG KWIKPEN) 100 UNIT/ML KwikPen Inject into the skin.    . Insulin Syringe-Needle U-100 (MONOJECT INS SYR 1CC/30G) 30G X 5/16" 1 ML MISC     . losartan (COZAAR) 25 MG tablet Take 25 mg by mouth daily.    . metoprolol succinate (TOPROL-XL) 25 MG 24 hr tablet     . Multiple Vitamins-Minerals (WOMENS MULTI VITAMIN & MINERAL PO) Take 1 tablet by mouth every morning.    . pregabalin (LYRICA) 50 MG capsule Take 50 mg by mouth 3 (three) times daily.    . traZODone (DESYREL) 50 MG tablet TAKE 1/2 TO 1 TABLET AT BEDTIME AS NEEDED FOR SLEEP 90 tablet 1  . chlorhexidine (PERIDEX) 0.12 % solution Use as directed 15 mLs in the mouth or throat 2 (two) times daily.    [name] escitalopram (LEXAPRO) 20 MG tablet Take 1 tablet (20 mg total) by mouth daily. 90 tablet 0   No current facility-administered medications for this visit.     Musculoskeletal: Strength & Muscle Tone: UTA Gait & Station: UTA Patient leans: N/A  Psychiatric Specialty Exam: Review of Systems  Psychiatric/Behavioral: Positive for dysphoric mood. The patient is nervous/anxious.   All other systems reviewed and are negative.   There were no  vitals taken for this visit.There is no height or weight on file to calculate BMI.  General Appearance: UTA  Eye Contact:  UTA  Speech:  Clear and Coherent  Volume:  Normal  Mood:  Anxious and Depressed  Affect:  UTA  Thought Process:  Goal Directed and Descriptions of Associations: Intact  Orientation:  Full (Time, Place, and Person)  Thought Content: Logical   Suicidal Thoughts:  No  Homicidal Thoughts:  No  Memory:  Immediate;   Fair Recent;   Fair Remote;   Fair  Judgement:  Fair  Insight:  Fair  Psychomotor Activity:  UTA  Concentration:  Concentration: Fair and Attention Span: Fair  Recall:  [employer] of Knowledge: Fair  Language: Fair  Akathisia:  No  Handed:  Right  AIMS (if indicated):UTA  Assets:  Communication Skills Desire for Improvement Housing Social Support  ADL's:  Intact  Cognition: WNL  Sleep:  Fair   Screenings: PHQ2-9     Patient Outreach Telephone from 03/19/2017 in [place]  PHQ-2 Total Score  2  PHQ-9 Total Score  8       Assessment and Plan: Consuela Widener is a 69 year old Caucasian female, married, on disability, lives in [place], has a history of depression, dementia, diabetes, coronary artery disease, aortic valve replacement was evaluated by phone today.  Patient is currently struggling with depressive symptoms and will benefit from medication readjustment.  Plan as noted below.  Plan MDD-unstable Increase Lexapro 20 mg p.o. daily  Insomnia-stable Trazodone 25 to 50 mg p.o. nightly as needed  Anxiety unspecified-unstable Lexapro will help. Patient will also benefit from psychotherapy session however she is not interested.  Dementia-stable Continue to follow-up with neurology She is on Aricept  Tobacco use disorder-improving Provided smoking cessation counseling  Follow-up in clinic in 4 to 8 weeks or sooner if needed.  I have spent atleast 20 minutes non face to face with patient today. More than 50 % of the time  was spent for preparing to see the patient ( e.g., review of test,  records ), ordering medications and test ,psychoeducation and supportive psychotherapy and care coordination,as well as documenting clinical information in electronic health record. This note was generated in part or whole with voice recognition software. Voice recognition is usually quite accurate but there are transcription errors that can and very often do occur. I apologize for any typographical errors that were not detected and corrected.      Ursula Alert, [name] 01/16/2020, 5:59 PM

## 2020-03-12 ENCOUNTER — Other Ambulatory Visit: Payer: Self-pay | Admitting: Psychiatry

## 2020-03-12 DIAGNOSIS — F330 Major depressive disorder, recurrent, mild: Secondary | ICD-10-CM

## 2020-03-12 DIAGNOSIS — F419 Anxiety disorder, unspecified: Secondary | ICD-10-CM

## 2020-03-14 NOTE — Progress Notes (Signed)
MRN : [id number]  Heather [name] is a 69 y.o. (1951-04-11) female who presents with chief complaint of No chief complaint on file. [name]  History of Present Illness:   The patient returns to the office for followup and review of the noninvasive studies. There have been no interval changes in lower extremity symptoms. No interval shortening of the patient's claudication distance or development of rest pain symptoms. No new ulcers or wounds have occurred since the last visit.  There have been no significant changes to the patient's overall health care.  The patient denies amaurosis fugax or recent TIA symptoms. There are no recent neurological changes noted. The patient denies history of DVT, PE or superficial thrombophlebitis. The patient denies recent episodes of angina or shortness of breath.   ABI Rt=1.02 and Lt=1.08  (previous ABI's Rt=1.01 and Lt=0.95)    No outpatient medications have been marked as taking for the 03/15/20 encounter (Appointment) with Delana Meyer, Dolores Lory, MD.    Past Medical History:  Diagnosis Date  . Anxiety   . Bipolar disorder ([place])   . Cancer ([place])    skin  . Depression   . Diabetes mellitus   . Diabetes mellitus, type II ([place])   . History of artificial heart valve   . Schizophrenia ([place])   . Vertigo     Past Surgical History:  Procedure Laterality Date  . ABDOMINAL HYSTERECTOMY    . BACK SURGERY    . CARDIAC VALVE REPLACEMENT    . CHOLECYSTECTOMY    . HEMORRHOID SURGERY    . TUBAL LIGATION      Social History Social History   Tobacco Use  . Smoking status: Current Every Day Smoker    Packs/day: 1.50    Types: Cigarettes  . Smokeless tobacco: Never Used  Vaping Use  . Vaping Use: Never used  Substance Use Topics  . Alcohol use: Yes    Alcohol/week: 1.0 - 2.0 standard drink    Types: 1 - 2 Cans of beer per week  . Drug use: Not Currently    Family History Family History  Problem Relation Age of Onset  . Mental illness Mother   .  Breast cancer Neg Hx     Allergies  Allergen Reactions  . Iodinated Diagnostic Agents Hives  . Sulfa Antibiotics   . Shellfish Allergy Rash and Other (See Comments)    Scallops specifically cause VOMITING Scallops specifically cause VOMITING      REVIEW OF SYSTEMS (Negative unless checked)  Constitutional: [] Weight loss  [] Fever  [] Chills Cardiac: [] Chest pain   [] Chest pressure   [] Palpitations   [] Shortness of breath when laying flat   [] Shortness of breath with exertion. Vascular:  [x] Pain in legs with walking   [] Pain in legs at rest  [] History of DVT   [] Phlebitis   [] Swelling in legs   [] Varicose veins   [] Non-healing ulcers Pulmonary:   [] Uses home oxygen   [] Productive cough   [] Hemoptysis   [] Wheeze  [] COPD   [] Asthma Neurologic:  [] Dizziness   [] Seizures   [] History of stroke   [] History of TIA  [] Aphasia   [] Vissual changes   [] Weakness or numbness in arm   [] Weakness or numbness in leg Musculoskeletal:   [] Joint swelling   [x] Joint pain   [x] Low back pain Hematologic:  [] Easy bruising  [] Easy bleeding   [] Hypercoagulable state   [] Anemic Gastrointestinal:  [] Diarrhea   [] Vomiting  [] Gastroesophageal reflux/heartburn   [] Difficulty swallowing. Genitourinary:  [] Chronic kidney disease   []   Difficult urination  [] Frequent urination   [] Blood in urine Skin:  [] Rashes   [] Ulcers  Psychological:  [] History of anxiety   []  History of major depression.  Physical Examination  There were no vitals filed for this visit. There is no height or weight on file to calculate BMI. Gen: WD/WN, NAD Head: [place]/AT, No temporalis wasting.  Ear/Nose/Throat: Hearing grossly intact, nares w/o erythema or drainage Eyes: PER, EOMI, sclera nonicteric.  Neck: Supple, no large masses.   Pulmonary:  Good air movement, no audible wheezing bilaterally, no use of accessory muscles.  Cardiac: RRR, no JVD Vascular:  Vessel Right Left  Radial Palpable Palpable  PT Palpable Palpable  DP Palpable  Palpable  Gastrointestinal: Non-distended. No guarding/no peritoneal signs.  Musculoskeletal: M/S 5/5 throughout.  No deformity or atrophy.  Neurologic: CN 2-12 intact. Symmetrical.  Speech is fluent. Motor exam as listed above. Psychiatric: Judgment intact, Mood & affect appropriate for pt's clinical situation. Dermatologic: No rashes or ulcers noted.  No changes consistent with cellulitis. Lymph : No lichenification or skin changes of chronic lymphedema.  CBC Lab Results  Component Value Date   WBC 7.6 06/20/2012   HGB 14.3 06/20/2012   HCT 42.9 06/20/2012   MCV 91 06/20/2012   PLT 357 06/20/2012    BMET    Component Value Date/Time   NA 135 (L) 06/20/2012 2127   K 4.0 06/20/2012 2127   CL 100 06/20/2012 2127   CO2 27 06/20/2012 2127   GLUCOSE 250 (H) 06/20/2012 2127   BUN 20 (H) 06/20/2012 2127   CREATININE 0.60 06/20/2012 2127   CALCIUM 8.8 06/20/2012 2127   GFRNONAA >60 06/20/2012 2127   GFRAA >60 06/20/2012 2127   CrCl cannot be calculated (Patient's most recent lab result is older than the maximum 21 days allowed.).  COAG No results found for: INR, PROTIME  Radiology No results found.   Assessment/Plan 1. Atherosclerosis of native artery of both lower extremities with intermittent claudication (HCC)  Recommend:  The patient has evidence of atherosclerosis of the lower extremities with claudication.  The patient does not voice lifestyle limiting changes at this point in time.  Noninvasive studies do not suggest clinically significant change.  No invasive studies, angiography or surgery at this time The patient should continue walking and begin a more formal exercise program.  The patient should continue antiplatelet therapy and aggressive treatment of the lipid abnormalities  No changes in the patient's medications at this time  The patient should continue wearing graduated compression socks 10-15 mmHg strength to control the mild edema.   - VAS [place] ABI  WITH/WO TBI; Future  2. Bilateral carotid artery stenosis Recommend:  Given the patient's asymptomatic subcritical stenosis no further invasive testing or surgery at this time.  Duplex ultrasound shows <50% stenosis bilaterally.  Continue antiplatelet therapy as prescribed Continue management of CAD, HTN and Hyperlipidemia Healthy heart diet,  encouraged exercise at least 4 times per week Follow up in 12 months with duplex ultrasound and physical exam   3. Coronary artery disease of native artery of native heart with stable angina pectoris (HCC) Continue cardiac and antihypertensive medications as already ordered and reviewed, no changes at this time.  Continue statin as ordered and reviewed, no changes at this time  Nitrates PRN for chest pain   4. Hypertension, benign Continue antihypertensive medications as already ordered, these medications have been reviewed and there are no changes at this time.   5. Mixed hyperlipidemia Continue statin as ordered and  reviewed, no changes at this time   6. DDD (degenerative disc disease), cervical Continue NSAID medications as already ordered, these medications have been reviewed and there are no changes at this time.  Continued activity and therapy was stressed.     [name], MD  03/14/2020 3:56 PM

## 2020-03-15 ENCOUNTER — Ambulatory Visit (INDEPENDENT_AMBULATORY_CARE_PROVIDER_SITE_OTHER): Payer: Medicare HMO

## 2020-03-15 ENCOUNTER — Ambulatory Visit (INDEPENDENT_AMBULATORY_CARE_PROVIDER_SITE_OTHER): Payer: Medicare HMO | Admitting: Vascular Surgery

## 2020-03-15 ENCOUNTER — Other Ambulatory Visit: Payer: Self-pay

## 2020-03-15 ENCOUNTER — Encounter (INDEPENDENT_AMBULATORY_CARE_PROVIDER_SITE_OTHER): Payer: Self-pay | Admitting: Vascular Surgery

## 2020-03-15 VITALS — BP 147/56 | HR 75 | Resp 16 | Wt 126.6 lb

## 2020-03-15 DIAGNOSIS — I10 Essential (primary) hypertension: Secondary | ICD-10-CM

## 2020-03-15 DIAGNOSIS — I70213 Atherosclerosis of native arteries of extremities with intermittent claudication, bilateral legs: Secondary | ICD-10-CM | POA: Diagnosis not present

## 2020-03-15 DIAGNOSIS — I25118 Atherosclerotic heart disease of native coronary artery with other forms of angina pectoris: Secondary | ICD-10-CM | POA: Diagnosis not present

## 2020-03-15 DIAGNOSIS — M5030 Other cervical disc degeneration, unspecified cervical region: Secondary | ICD-10-CM

## 2020-03-15 DIAGNOSIS — I6523 Occlusion and stenosis of bilateral carotid arteries: Secondary | ICD-10-CM

## 2020-03-15 DIAGNOSIS — E782 Mixed hyperlipidemia: Secondary | ICD-10-CM

## 2020-03-19 ENCOUNTER — Telehealth (INDEPENDENT_AMBULATORY_CARE_PROVIDER_SITE_OTHER): Payer: Medicare HMO | Admitting: Psychiatry

## 2020-03-19 ENCOUNTER — Other Ambulatory Visit: Payer: Self-pay

## 2020-03-19 DIAGNOSIS — Z5329 Procedure and treatment not carried out because of patient's decision for other reasons: Secondary | ICD-10-CM

## 2020-03-19 NOTE — Progress Notes (Signed)
No response to call or text or video invite  

## 2020-05-24 ENCOUNTER — Other Ambulatory Visit: Payer: Self-pay | Admitting: Psychiatry

## 2020-05-24 DIAGNOSIS — F5105 Insomnia due to other mental disorder: Secondary | ICD-10-CM

## 2020-06-11 ENCOUNTER — Other Ambulatory Visit: Payer: Self-pay | Admitting: Family Medicine

## 2020-06-11 DIAGNOSIS — Z1231 Encounter for screening mammogram for malignant neoplasm of breast: Secondary | ICD-10-CM

## 2020-08-02 ENCOUNTER — Other Ambulatory Visit: Payer: Self-pay | Admitting: Physician Assistant

## 2020-08-02 DIAGNOSIS — Z13820 Encounter for screening for osteoporosis: Secondary | ICD-10-CM

## 2020-08-02 DIAGNOSIS — Z1231 Encounter for screening mammogram for malignant neoplasm of breast: Secondary | ICD-10-CM

## 2020-09-10 ENCOUNTER — Encounter (INDEPENDENT_AMBULATORY_CARE_PROVIDER_SITE_OTHER): Payer: Medicare HMO

## 2020-09-10 ENCOUNTER — Ambulatory Visit (INDEPENDENT_AMBULATORY_CARE_PROVIDER_SITE_OTHER): Payer: Medicare HMO | Admitting: Nurse Practitioner

## 2020-10-06 ENCOUNTER — Emergency Department
Admission: EM | Admit: 2020-10-06 | Discharge: 2020-10-07 | Disposition: A | Discharge status: Home / Self Care | Payer: Medicare HMO | Attending: Emergency Medicine | Admitting: Emergency Medicine

## 2020-10-06 ENCOUNTER — Other Ambulatory Visit: Payer: Self-pay

## 2020-10-06 ENCOUNTER — Emergency Department: Payer: Medicare HMO

## 2020-10-06 DIAGNOSIS — Z7951 Long term (current) use of inhaled steroids: Secondary | ICD-10-CM | POA: Diagnosis not present

## 2020-10-06 DIAGNOSIS — R1084 Generalized abdominal pain: Secondary | ICD-10-CM | POA: Diagnosis present

## 2020-10-06 DIAGNOSIS — Z85828 Personal history of other malignant neoplasm of skin: Secondary | ICD-10-CM | POA: Insufficient documentation

## 2020-10-06 DIAGNOSIS — K5792 Diverticulitis of intestine, part unspecified, without perforation or abscess without bleeding: Secondary | ICD-10-CM | POA: Insufficient documentation

## 2020-10-06 DIAGNOSIS — Z794 Long term (current) use of insulin: Secondary | ICD-10-CM | POA: Diagnosis not present

## 2020-10-06 DIAGNOSIS — I509 Heart failure, unspecified: Secondary | ICD-10-CM | POA: Diagnosis not present

## 2020-10-06 DIAGNOSIS — I2510 Atherosclerotic heart disease of native coronary artery without angina pectoris: Secondary | ICD-10-CM | POA: Diagnosis not present

## 2020-10-06 DIAGNOSIS — I110 Hypertensive heart disease with heart failure: Secondary | ICD-10-CM | POA: Insufficient documentation

## 2020-10-06 DIAGNOSIS — Z7902 Long term (current) use of antithrombotics/antiplatelets: Secondary | ICD-10-CM | POA: Diagnosis not present

## 2020-10-06 DIAGNOSIS — Z7982 Long term (current) use of aspirin: Secondary | ICD-10-CM | POA: Insufficient documentation

## 2020-10-06 DIAGNOSIS — F17210 Nicotine dependence, cigarettes, uncomplicated: Secondary | ICD-10-CM | POA: Diagnosis not present

## 2020-10-06 DIAGNOSIS — J449 Chronic obstructive pulmonary disease, unspecified: Secondary | ICD-10-CM | POA: Diagnosis not present

## 2020-10-06 DIAGNOSIS — E119 Type 2 diabetes mellitus without complications: Secondary | ICD-10-CM | POA: Insufficient documentation

## 2020-10-06 LAB — URINALYSIS, COMPLETE (UACMP) WITH MICROSCOPIC
Bilirubin Urine: NEGATIVE
Glucose, UA: NEGATIVE mg/dL
Hgb urine dipstick: NEGATIVE
Ketones, ur: 5 mg/dL — AB
Nitrite: NEGATIVE
Protein, ur: NEGATIVE mg/dL
Specific Gravity, Urine: 1.02 (ref 1.005–1.030)
pH: 5 (ref 5.0–8.0)

## 2020-10-06 LAB — COMPREHENSIVE METABOLIC PANEL
ALT: 12 U/L (ref 0–44)
AST: 14 U/L — ABNORMAL LOW (ref 15–41)
Albumin: 3.8 g/dL (ref 3.5–5.0)
Alkaline Phosphatase: 75 U/L (ref 38–126)
Anion gap: 12 (ref 5–15)
BUN: 26 mg/dL — ABNORMAL HIGH (ref 8–23)
CO2: 24 mmol/L (ref 22–32)
Calcium: 9.5 mg/dL (ref 8.9–10.3)
Chloride: 100 mmol/L (ref 98–111)
Creatinine, Ser: 1.11 mg/dL — ABNORMAL HIGH (ref 0.44–1.00)
GFR, Estimated: 54 mL/min — ABNORMAL LOW (ref >60)
Glucose, Bld: 223 mg/dL — ABNORMAL HIGH (ref 70–99)
Potassium: 4.3 mmol/L (ref 3.5–5.1)
Sodium: 136 mmol/L (ref 135–145)
Total Bilirubin: 0.5 mg/dL (ref 0.3–1.2)
Total Protein: 7.5 g/dL (ref 6.5–8.1)

## 2020-10-06 LAB — CBC
HCT: 40.1 % (ref 36.0–46.0)
Hemoglobin: 13.1 g/dL (ref 12.0–15.0)
MCH: 30.4 pg (ref 26.0–34.0)
MCHC: 32.7 g/dL (ref 30.0–36.0)
MCV: 93 fL (ref 80.0–100.0)
Platelets: 499 10*3/uL — ABNORMAL HIGH (ref 150–400)
RBC: 4.31 MIL/uL (ref 3.87–5.11)
RDW: 13.4 % (ref 11.5–15.5)
WBC: 18.2 10*3/uL — ABNORMAL HIGH (ref 4.0–10.5)
nRBC: 0 % (ref 0.0–0.2)

## 2020-10-06 LAB — LIPASE, BLOOD: Lipase: 26 U/L (ref 11–51)

## 2020-10-06 MED ORDER — AMOXICILLIN-POT CLAVULANATE 875-125 MG PO TABS
1.0000 | ORAL_TABLET | Freq: Once | ORAL | Status: AC
  Administered 2020-10-06: 1 via ORAL
  Filled 2020-10-06: qty 1

## 2020-10-06 MED ORDER — HYDROCODONE-ACETAMINOPHEN 5-325 MG PO TABS: 1.0000 | ORAL_TABLET | ORAL | 0 refills | Status: DC | PRN

## 2020-10-06 MED ORDER — SODIUM CHLORIDE 0.9 % IV BOLUS
1000.0000 mL | Freq: Once | INTRAVENOUS | Status: AC
  Administered 2020-10-06: 1000 mL via INTRAVENOUS

## 2020-10-06 MED ORDER — MORPHINE SULFATE (PF) 4 MG/ML IV SOLN
4.0000 mg | Freq: Once | INTRAVENOUS | Status: AC
Start: 2020-10-06 — End: 2020-10-06
  Administered 2020-10-06: 4 mg via INTRAVENOUS
  Filled 2020-10-06: qty 1

## 2020-10-06 MED ORDER — ONDANSETRON 4 MG PO TBDP
ORAL_TABLET | ORAL | Status: AC
  Administered 2020-10-06: 4 mg via ORAL
  Filled 2020-10-06: qty 1

## 2020-10-06 MED ORDER — AMOXICILLIN-POT CLAVULANATE 875-125 MG PO TABS: 1.0000 | ORAL_TABLET | Freq: Two times a day (BID) | ORAL | 0 refills | Status: AC

## 2020-10-06 MED ORDER — ONDANSETRON HCL 4 MG/2ML IJ SOLN
4.0000 mg | Freq: Once | INTRAMUSCULAR | Status: AC
  Administered 2020-10-06: 4 mg via INTRAVENOUS
  Filled 2020-10-06: qty 2

## 2020-10-06 MED ORDER — ONDANSETRON 4 MG PO TBDP: 4.0000 mg | ORAL_TABLET | Freq: Once | ORAL | Status: AC | PRN

## 2020-10-06 NOTE — ED Notes (Signed)
Np made aware pt still complaining of pain despite pain medication intervention. Pt relaxing comfortably in bed when awakened for Blood sugar check pt drowsy, but complaining of pain. No further orders at this time.

## 2020-10-06 NOTE — ED Provider Notes (Signed)
[facility] Emergency Department Provider Note  Time seen: 10:04 PM  I have reviewed the triage vital signs and the nursing notes.   HISTORY  Chief Complaint Abdominal Pain   HPI Seri Kimmer is a 70 y.o. female with a past medical history anxiety, bipolar, diabetes, schizophrenia, presents emergency department for abdominal pain.  According to the patient for the past month or so she has been experiencing abdominal pain that has gotten worse over the past 1 week.  Describes as central abdominal pain, moderate in severity.  States today she was getting lightheaded and dizzy due to the pain so she came to the emergency department for evaluation.  Patient states she has had occasional nausea and vomiting and diarrhea over the past month.  No dysuria.  No known fever cough or shortness of breath.  Past Medical History:  Diagnosis Date  . Anxiety   . Bipolar disorder ([place])   . Cancer ([place])    skin  . Depression   . Diabetes mellitus   . Diabetes mellitus, type II ([place])   . History of artificial heart valve   . Schizophrenia ([place])   . Vertigo     Patient Active Problem List   Diagnosis Date Noted  . MDD (major depressive disorder), recurrent, in full remission ([place]) 01/16/2020  . Anxiety disorder 01/16/2020  . Hyperlipidemia 12/15/2019  . Carotid stenosis 12/15/2019  . MDD (major depressive disorder), recurrent, in partial remission ([place]) 08/24/2019  . MDD (major depressive disorder), recurrent episode, mild ([place]) 04/08/2019  . Insomnia due to mental condition 04/08/2019  . Major neurocognitive disorder due to Alzheimer's disease, probable, without behavioral disturbance 04/08/2019  . DDD (degenerative disc disease), cervical 12/19/2016  . Frequent falls 12/10/2016  . Uncontrolled type 2 diabetes mellitus with insulin therapy ([place]) 12/10/2016  . 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 Nonrheumatic aortic valve stenosis 09/30/2016  .  (HFpEF) heart failure with preserved ejection fraction ([place]) 09/20/2016  . COPD (chronic obstructive pulmonary disease) ([place]) 09/20/2016  . Respiratory failure with hypoxia ([place]) 09/20/2016  . NSTEMI (non-ST elevated myocardial infarction) ([place]) 08/21/2016  . SOB (shortness of breath) 08/18/2016  . Atherosclerosis of native arteries of extremity with intermittent claudication ([place]) 01/08/2016  . Palpitations 07/10/2015  . History of nonmelanoma skin cancer 03/16/2015  . Pseudoaneurysm following procedure ([place]) 07/19/2014  . 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 Claudication ([place]) 06/05/2014  . Aortic insufficiency 03/20/2014  . CAD (coronary artery disease) 03/20/2014  . Chronic pain 03/20/2014  . Memory loss 11/09/2012  . Depressive disorder 08/02/2012  . Neck pain 08/02/2012  . Malaise and fatigue 08/02/2012  . Hypertension, benign 08/02/2012  . Dizziness 08/02/2012  . Polyneuropathy in diabetes ([place]) 08/02/2012  . Rheumatic fever with heart involvement 08/02/2012  . Tobacco use disorder 08/02/2012  . Diabetes mellitus ([place]) 08/02/1990    Past Surgical History:  Procedure Laterality Date  . ABDOMINAL HYSTERECTOMY    . BACK SURGERY    . CARDIAC VALVE REPLACEMENT    . CHOLECYSTECTOMY    . HEMORRHOID SURGERY    . TUBAL LIGATION      Prior to Admission medications   Medication Sig Start Date End Date Taking? Authorizing Provider  ACCU-CHEK AVIVA PLUS test strip  11/19/18   [provider]  Accu-Chek Softclix Lancets lancets  11/19/18   [provider]  albuterol (PROVENTIL HFA) 108 (90 Base) MCG/ACT inhaler Inhale into the lungs. 03/09/14   [provider]  Alcohol  Swabs (B-D SINGLE USE SWABS REGULAR) PADS  11/19/18   [provider]  aspirin EC 81 MG tablet Take 81 mg by mouth daily.    [provider]  atorvastatin (LIPITOR) 80 MG tablet  08/28/18   [provider]  Blood Glucose Calibration (ACCU-CHEK AVIVA)  SOLN  11/20/18   [provider]  Blood Glucose Monitoring Suppl (ACCU-CHEK AVIVA PLUS) w/Device KIT  11/19/18   [provider]  chlorhexidine (PERIDEX) 0.12 % solution Use as directed 15 mLs in the mouth or throat 2 (two) times daily.    [provider]  cholecalciferol (VITAMIN D) 1000 units tablet Take 1,000 Units by mouth daily. Takes 2000 units daily.    [provider]  clopidogrel (PLAVIX) 75 MG tablet  12/10/16   [provider]  cyclobenzaprine (FLEXERIL) 10 MG tablet  09/07/18   [provider]  donepezil (ARICEPT) 5 MG tablet Take 5 mg by mouth at bedtime.    [provider]  escitalopram (LEXAPRO) 20 MG tablet Take 1 tablet (20 mg total) by mouth daily. 01/16/20   Ursula Alert, MD  fluticasone Asencion Islam) 50 MCG/ACT nasal spray  11/02/16   [provider]  Fluticasone-Salmeterol (ADVAIR) 250-50 MCG/DOSE AEPB  10/02/16   [provider]  furosemide (LASIX) 20 MG tablet  08/28/18   [provider]  Insulin Aspart FlexPen 100 UNIT/ML SOPN  11/19/18   [provider]  insulin glargine (LANTUS) 100 UNIT/ML injection Inject 40 Units into the skin at bedtime.    [provider]  insulin lispro (HUMALOG KWIKPEN) 100 UNIT/ML KwikPen Inject 7-10 Units into the skin.     [provider]  Insulin Syringe-Needle U-100 (MONOJECT INS SYR 1CC/30G) 30G X 5/16" 1 ML MISC  02/19/16   [provider]  losartan (COZAAR) 25 MG tablet Take 25 mg by mouth daily.    [provider]  metoprolol succinate (TOPROL-XL) 25 MG 24 hr tablet  10/02/18   [provider]  Multiple Vitamins-Minerals (WOMENS MULTI VITAMIN & MINERAL PO) Take 1 tablet by mouth every morning.    [provider]  pregabalin (LYRICA) 50 MG capsule Take 50 mg by mouth 3 (three) times daily.    [provider]  traZODone (DESYREL) 50 MG tablet TAKE 1/2 TO 1 TABLET AT BEDTIME AS NEEDED FOR SLEEP  05/25/20   Ursula Alert, MD    Allergies  Allergen Reactions  . Iodinated Diagnostic Agents Hives  . Sulfa Antibiotics   . Shellfish Allergy Rash and Other (See Comments)    Scallops specifically cause VOMITING Scallops specifically cause VOMITING     Family History  Problem Relation Age of Onset  . Mental illness Mother   . Breast cancer Neg Hx     Social History Social History   Tobacco Use  . Smoking status: Current Every Day Smoker    Packs/day: 1.50    Types: Cigarettes  . Smokeless tobacco: Never Used  Vaping Use  . Vaping Use: Never used  Substance Use Topics  . Alcohol use: Yes    Alcohol/week: 1.0 - 2.0 standard drink    Types: 1 - 2 Cans of beer per week  . Drug use: Not Currently    Review of Systems Constitutional: Negative for fever. Cardiovascular: Negative for chest pain. Respiratory: Negative for shortness of breath. Gastrointestinal: 8/10 dull aching central abdominal pain.  Intermittent nausea vomiting diarrhea over the past several weeks Genitourinary: Negative for urinary compaints Musculoskeletal: Negative for musculoskeletal  complaints Neurological: Negative for headache All other ROS negative  ____________________________________________   PHYSICAL EXAM:  VITAL SIGNS: ED Triage Vitals  Enc Vitals Group     BP 10/06/20 2000 (!) 142/59     Pulse Rate 10/06/20 2000 93     Resp 10/06/20 2000 18     Temp 10/06/20 2000 97.8 F (36.6 C)     Temp Source 10/06/20 2000 Oral     SpO2 10/06/20 2000 97 %     Weight 10/06/20 2003 120 lb (54.4 kg)     Height 10/06/20 2003 5' (1.524 m)     Head Circumference --      Peak Flow --      Pain Score 10/06/20 2001 8     Pain Loc --      Pain Edu? --      Excl. in [place]? --     Constitutional: Alert and oriented. Well appearing and in no distress. Eyes: Normal exam ENT      Head: Normocephalic and atraumatic.      Mouth/Throat: Mucous membranes are moist. Cardiovascular: Normal rate, regular  rhythm.  Respiratory: Normal respiratory effort without tachypnea nor retractions. Breath sounds are clear  Gastrointestinal: Soft, mild to moderate periumbilical and epigastric tenderness to palpation. Musculoskeletal: Nontender with normal range of motion in all extremities.  Neurologic:  Normal speech and language. No gross focal neurologic deficits  Skin:  Skin is warm, dry and intact.  Psychiatric: Mood and affect are normal.   ____________________________________________    EKG  EKG viewed and interpreted by myself shows what appears to be a sinus rhythm at 89 bpm with a narrow QRS, normal axis, normal intervals with nonspecific ST changes.  ____________________________________________    RADIOLOGY  CT scan consistent with uncomplicated diverticulitis of the mid sigmoid colon.  ____________________________________________   INITIAL IMPRESSION / ASSESSMENT AND PLAN / ED COURSE  Pertinent labs & imaging results that were available during my care of the patient were reviewed by me and considered in my medical decision making (see chart for details).   Patient presents to the emergency department for abdominal pain nausea vomiting diarrhea over the past several weeks to 1 month.  States the pain is gotten worse and now she was getting dizzy today due to the pain.  Patient does have mild to moderate periumbilical tenderness to palpation.  No rebound guarding or distention.  Patient's labs have resulted showing moderate leukocytosis of 18,000.  Given the patient's elevated white blood cell count we will proceed with CT imaging.  Given the patient's IV contrast allergy we will proceed with CT imaging without contrast.  Patient agreeable to plan of care.  We will treat pain nausea and IV hydrate while awaiting CT results.  CT consistent with uncomplicated diverticulitis of the mid sigmoid colon.  We will place the patient on antibiotics.  We will place patient on short course of pain  nausea medication as well.  Discussed return precautions.  Patient agreeable to plan of care.  Laiklynn Raczynski was evaluated in Emergency Department on 10/06/2020 for the symptoms described in the history of present illness. She was evaluated in the context of the global COVID-19 pandemic, which necessitated consideration that the patient might be at risk for infection with the SARS-CoV-2 virus that causes COVID-19. Institutional protocols and algorithms that pertain to the evaluation of patients at risk for COVID-19 are in a state of rapid change based on information released by regulatory bodies including the CDC and federal  and state organizations. These policies and algorithms were followed during the patient's care in the ED.  ____________________________________________   FINAL CLINICAL IMPRESSION(S) / ED DIAGNOSES  Diverticulitis   Harvest Dark, MD 10/06/20 2315

## 2020-10-06 NOTE — ED Triage Notes (Signed)
Pt states she has had 8/10 central abdominal pain x1 month accompanied with N/V/D. States tonight she felt "lightheaded and dizzy" and is concerned for dehydration. Pt states her PCP believed that her metformin was causing the symptoms, however she was taken off her metformin a month ago but symptoms persist. Pt alert and oriented at this time, no acute distress noted.

## 2020-10-06 NOTE — ED Notes (Signed)
Patient to waiting room via wheelchair by EMS.  Per EMS reports patient with abdominal pain with nausea, vomiting and diarrhea for the past 3-4 months, tonight also had dizziness and that it why she called EMS. EMS interventions -- hr 95, bp 116/68, pulse oxi 98%, cbg 220.

## 2020-10-06 NOTE — ED Notes (Signed)
Patient transported to CT 

## 2020-10-15 ENCOUNTER — Other Ambulatory Visit: Payer: Self-pay | Admitting: Psychiatry

## 2020-10-15 DIAGNOSIS — F5105 Insomnia due to other mental disorder: Secondary | ICD-10-CM

## 2020-11-09 ENCOUNTER — Emergency Department: Payer: Medicare HMO

## 2020-11-09 ENCOUNTER — Inpatient Hospital Stay
Admission: EM | Admit: 2020-11-09 | Discharge: 2020-11-15 | DRG: 357 | Disposition: A | Discharge status: Home Health | Payer: Medicare HMO | Source: Ambulatory Visit | Attending: Internal Medicine | Admitting: Internal Medicine

## 2020-11-09 ENCOUNTER — Other Ambulatory Visit: Payer: Self-pay

## 2020-11-09 DIAGNOSIS — I5032 Chronic diastolic (congestive) heart failure: Secondary | ICD-10-CM | POA: Diagnosis present

## 2020-11-09 DIAGNOSIS — E876 Hypokalemia: Secondary | ICD-10-CM | POA: Diagnosis present

## 2020-11-09 DIAGNOSIS — Z90710 Acquired absence of both cervix and uterus: Secondary | ICD-10-CM

## 2020-11-09 DIAGNOSIS — Z20822 Contact with and (suspected) exposure to covid-19: Secondary | ICD-10-CM | POA: Diagnosis present

## 2020-11-09 DIAGNOSIS — R1084 Generalized abdominal pain: Secondary | ICD-10-CM | POA: Diagnosis not present

## 2020-11-09 DIAGNOSIS — Z794 Long term (current) use of insulin: Secondary | ICD-10-CM | POA: Diagnosis not present

## 2020-11-09 DIAGNOSIS — I700 Atherosclerosis of aorta: Secondary | ICD-10-CM | POA: Diagnosis present

## 2020-11-09 DIAGNOSIS — K55059 Acute (reversible) ischemia of intestine, part and extent unspecified: Secondary | ICD-10-CM | POA: Diagnosis not present

## 2020-11-09 DIAGNOSIS — R296 Repeated falls: Secondary | ICD-10-CM | POA: Diagnosis present

## 2020-11-09 DIAGNOSIS — I70213 Atherosclerosis of native arteries of extremities with intermittent claudication, bilateral legs: Secondary | ICD-10-CM | POA: Diagnosis present

## 2020-11-09 DIAGNOSIS — E1151 Type 2 diabetes mellitus with diabetic peripheral angiopathy without gangrene: Secondary | ICD-10-CM | POA: Diagnosis present

## 2020-11-09 DIAGNOSIS — E861 Hypovolemia: Secondary | ICD-10-CM | POA: Diagnosis present

## 2020-11-09 DIAGNOSIS — Z9049 Acquired absence of other specified parts of digestive tract: Secondary | ICD-10-CM | POA: Diagnosis not present

## 2020-11-09 DIAGNOSIS — F319 Bipolar disorder, unspecified: Secondary | ICD-10-CM | POA: Diagnosis present

## 2020-11-09 DIAGNOSIS — I951 Orthostatic hypotension: Secondary | ICD-10-CM | POA: Diagnosis present

## 2020-11-09 DIAGNOSIS — Z6824 Body mass index (BMI) 24.0-24.9, adult: Secondary | ICD-10-CM

## 2020-11-09 DIAGNOSIS — Z79899 Other long term (current) drug therapy: Secondary | ICD-10-CM

## 2020-11-09 DIAGNOSIS — I2510 Atherosclerotic heart disease of native coronary artery without angina pectoris: Secondary | ICD-10-CM | POA: Diagnosis present

## 2020-11-09 DIAGNOSIS — R55 Syncope and collapse: Secondary | ICD-10-CM | POA: Diagnosis not present

## 2020-11-09 DIAGNOSIS — Z7982 Long term (current) use of aspirin: Secondary | ICD-10-CM

## 2020-11-09 DIAGNOSIS — R109 Unspecified abdominal pain: Secondary | ICD-10-CM

## 2020-11-09 DIAGNOSIS — E440 Moderate protein-calorie malnutrition: Secondary | ICD-10-CM | POA: Diagnosis present

## 2020-11-09 DIAGNOSIS — I5030 Unspecified diastolic (congestive) heart failure: Secondary | ICD-10-CM | POA: Diagnosis present

## 2020-11-09 DIAGNOSIS — E785 Hyperlipidemia, unspecified: Secondary | ICD-10-CM | POA: Diagnosis present

## 2020-11-09 DIAGNOSIS — N179 Acute kidney failure, unspecified: Secondary | ICD-10-CM | POA: Diagnosis present

## 2020-11-09 DIAGNOSIS — E119 Type 2 diabetes mellitus without complications: Secondary | ICD-10-CM

## 2020-11-09 DIAGNOSIS — F209 Schizophrenia, unspecified: Secondary | ICD-10-CM | POA: Diagnosis present

## 2020-11-09 DIAGNOSIS — E860 Dehydration: Secondary | ICD-10-CM | POA: Diagnosis present

## 2020-11-09 DIAGNOSIS — I25118 Atherosclerotic heart disease of native coronary artery with other forms of angina pectoris: Secondary | ICD-10-CM | POA: Diagnosis not present

## 2020-11-09 DIAGNOSIS — Z952 Presence of prosthetic heart valve: Secondary | ICD-10-CM

## 2020-11-09 DIAGNOSIS — Z882 Allergy status to sulfonamides status: Secondary | ICD-10-CM | POA: Diagnosis not present

## 2020-11-09 DIAGNOSIS — Z7951 Long term (current) use of inhaled steroids: Secondary | ICD-10-CM

## 2020-11-09 DIAGNOSIS — Z7902 Long term (current) use of antithrombotics/antiplatelets: Secondary | ICD-10-CM

## 2020-11-09 DIAGNOSIS — J439 Emphysema, unspecified: Secondary | ICD-10-CM | POA: Diagnosis present

## 2020-11-09 DIAGNOSIS — Z91041 Radiographic dye allergy status: Secondary | ICD-10-CM

## 2020-11-09 DIAGNOSIS — I70219 Atherosclerosis of native arteries of extremities with intermittent claudication, unspecified extremity: Secondary | ICD-10-CM | POA: Diagnosis present

## 2020-11-09 DIAGNOSIS — I110 Hypertensive heart disease with heart failure: Secondary | ICD-10-CM | POA: Diagnosis present

## 2020-11-09 DIAGNOSIS — K551 Chronic vascular disorders of intestine: Secondary | ICD-10-CM | POA: Diagnosis present

## 2020-11-09 DIAGNOSIS — F17210 Nicotine dependence, cigarettes, uncomplicated: Secondary | ICD-10-CM | POA: Diagnosis present

## 2020-11-09 DIAGNOSIS — Z85828 Personal history of other malignant neoplasm of skin: Secondary | ICD-10-CM

## 2020-11-09 DIAGNOSIS — Z955 Presence of coronary angioplasty implant and graft: Secondary | ICD-10-CM

## 2020-11-09 DIAGNOSIS — I10 Essential (primary) hypertension: Secondary | ICD-10-CM | POA: Diagnosis present

## 2020-11-09 DIAGNOSIS — R8271 Bacteriuria: Secondary | ICD-10-CM

## 2020-11-09 DIAGNOSIS — I771 Stricture of artery: Secondary | ICD-10-CM | POA: Diagnosis not present

## 2020-11-09 LAB — COMPREHENSIVE METABOLIC PANEL
ALT: 9 U/L (ref 0–44)
AST: 14 U/L — ABNORMAL LOW (ref 15–41)
Albumin: 3.4 g/dL — ABNORMAL LOW (ref 3.5–5.0)
Alkaline Phosphatase: 78 U/L (ref 38–126)
Anion gap: 10 (ref 5–15)
BUN: 25 mg/dL — ABNORMAL HIGH (ref 8–23)
CO2: 22 mmol/L (ref 22–32)
Calcium: 8.8 mg/dL — ABNORMAL LOW (ref 8.9–10.3)
Chloride: 101 mmol/L (ref 98–111)
Creatinine, Ser: 1.45 mg/dL — ABNORMAL HIGH (ref 0.44–1.00)
GFR, Estimated: 40 mL/min — ABNORMAL LOW (ref >60)
Glucose, Bld: 179 mg/dL — ABNORMAL HIGH (ref 70–99)
Potassium: 2.8 mmol/L — ABNORMAL LOW (ref 3.5–5.1)
Sodium: 133 mmol/L — ABNORMAL LOW (ref 135–145)
Total Bilirubin: 0.5 mg/dL (ref 0.3–1.2)
Total Protein: 7.1 g/dL (ref 6.5–8.1)

## 2020-11-09 LAB — URINALYSIS, COMPLETE (UACMP) WITH MICROSCOPIC
Bilirubin Urine: NEGATIVE
Glucose, UA: NEGATIVE mg/dL
Hgb urine dipstick: NEGATIVE
Ketones, ur: NEGATIVE mg/dL
Nitrite: NEGATIVE
Protein, ur: NEGATIVE mg/dL
Specific Gravity, Urine: 1.017 (ref 1.005–1.030)
pH: 5 (ref 5.0–8.0)

## 2020-11-09 LAB — CBC
HCT: 38.5 % (ref 36.0–46.0)
Hemoglobin: 12.8 g/dL (ref 12.0–15.0)
MCH: 30.1 pg (ref 26.0–34.0)
MCHC: 33.2 g/dL (ref 30.0–36.0)
MCV: 90.6 fL (ref 80.0–100.0)
Platelets: 516 10*3/uL — ABNORMAL HIGH (ref 150–400)
RBC: 4.25 MIL/uL (ref 3.87–5.11)
RDW: 14.5 % (ref 11.5–15.5)
WBC: 14.9 10*3/uL — ABNORMAL HIGH (ref 4.0–10.5)
nRBC: 0 % (ref 0.0–0.2)

## 2020-11-09 LAB — LIPASE, BLOOD: Lipase: 26 U/L (ref 11–51)

## 2020-11-09 LAB — TROPONIN I (HIGH SENSITIVITY): Troponin I (High Sensitivity): 12 ng/L (ref <18)

## 2020-11-09 LAB — LACTIC ACID, PLASMA: Lactic Acid, Venous: 1.1 mmol/L (ref 0.5–1.9)

## 2020-11-09 MED ORDER — ESCITALOPRAM OXALATE 10 MG PO TABS
20.0000 mg | ORAL_TABLET | Freq: Every day | ORAL | Status: DC
  Administered 2020-11-10 – 2020-11-15 (×5): 20 mg via ORAL
  Filled 2020-11-09 (×6): qty 2

## 2020-11-09 MED ORDER — ATORVASTATIN CALCIUM 20 MG PO TABS
80.0000 mg | ORAL_TABLET | Freq: Every day | ORAL | Status: DC
  Administered 2020-11-10 – 2020-11-15 (×5): 80 mg via ORAL
  Filled 2020-11-09 (×5): qty 4

## 2020-11-09 MED ORDER — SODIUM CHLORIDE 0.9 % IV BOLUS
1000.0000 mL | Freq: Once | INTRAVENOUS | Status: AC
  Administered 2020-11-09: 1000 mL via INTRAVENOUS

## 2020-11-09 MED ORDER — SODIUM CHLORIDE 0.9 % IV SOLN
2.0000 g | Freq: Once | INTRAVENOUS | Status: AC
  Administered 2020-11-09: 2 g via INTRAVENOUS
  Filled 2020-11-09: qty 20

## 2020-11-09 MED ORDER — POTASSIUM CHLORIDE CRYS ER 20 MEQ PO TBCR
40.0000 meq | EXTENDED_RELEASE_TABLET | Freq: Two times a day (BID) | ORAL | Status: AC
  Administered 2020-11-10 (×2): 40 meq via ORAL
  Filled 2020-11-09 (×2): qty 2

## 2020-11-09 MED ORDER — CLOPIDOGREL BISULFATE 75 MG PO TABS
75.0000 mg | ORAL_TABLET | Freq: Every day | ORAL | Status: DC
  Administered 2020-11-10 – 2020-11-15 (×5): 75 mg via ORAL
  Filled 2020-11-09 (×5): qty 1

## 2020-11-09 MED ORDER — TRAZODONE HCL 50 MG PO TABS
50.0000 mg | ORAL_TABLET | Freq: Every evening | ORAL | Status: DC | PRN
  Administered 2020-11-12 – 2020-11-14 (×2): 50 mg via ORAL
  Filled 2020-11-09 (×2): qty 1

## 2020-11-09 MED ORDER — INSULIN ASPART 100 UNIT/ML ~~LOC~~ SOLN
0.0000 [IU] | Freq: Three times a day (TID) | SUBCUTANEOUS | Status: DC
  Administered 2020-11-10: 17:00:00 5 [IU] via SUBCUTANEOUS
  Administered 2020-11-11: 7 [IU] via SUBCUTANEOUS
  Administered 2020-11-11: 09:00:00 5 [IU] via SUBCUTANEOUS
  Administered 2020-11-11: 17:00:00 3 [IU] via SUBCUTANEOUS
  Administered 2020-11-12: 13:00:00 2 [IU] via SUBCUTANEOUS
  Administered 2020-11-14: 5 [IU] via SUBCUTANEOUS
  Administered 2020-11-14: 14:00:00 3 [IU] via SUBCUTANEOUS
  Administered 2020-11-14 – 2020-11-15 (×2): 2 [IU] via SUBCUTANEOUS
  Filled 2020-11-09 (×9): qty 1

## 2020-11-09 MED ORDER — ONDANSETRON HCL 4 MG/2ML IJ SOLN: 4.0000 mg | Freq: Four times a day (QID) | INTRAMUSCULAR | Status: DC | PRN

## 2020-11-09 MED ORDER — ENOXAPARIN SODIUM 30 MG/0.3ML ~~LOC~~ SOLN
30.0000 mg | SUBCUTANEOUS | Status: DC
  Administered 2020-11-10: 30 mg via SUBCUTANEOUS
  Filled 2020-11-09: qty 0.3

## 2020-11-09 MED ORDER — FLUTICASONE PROPIONATE 50 MCG/ACT NA SUSP
2.0000 | Freq: Every day | NASAL | Status: DC
  Administered 2020-11-10 – 2020-11-15 (×6): 2 via NASAL
  Filled 2020-11-09: qty 16

## 2020-11-09 MED ORDER — ONDANSETRON HCL 4 MG PO TABS: 4.0000 mg | ORAL_TABLET | Freq: Four times a day (QID) | ORAL | Status: DC | PRN

## 2020-11-09 MED ORDER — INSULIN GLARGINE 100 UNIT/ML ~~LOC~~ SOLN
20.0000 [IU] | Freq: Every day | SUBCUTANEOUS | Status: DC
  Administered 2020-11-10 – 2020-11-11 (×3): 20 [IU] via SUBCUTANEOUS
  Filled 2020-11-09 (×3): qty 0.2

## 2020-11-09 MED ORDER — ACETAMINOPHEN 650 MG RE SUPP: 650.0000 mg | Freq: Four times a day (QID) | RECTAL | Status: DC | PRN

## 2020-11-09 MED ORDER — DONEPEZIL HCL 5 MG PO TABS
5.0000 mg | ORAL_TABLET | Freq: Every day | ORAL | Status: DC
  Administered 2020-11-10 – 2020-11-14 (×6): 5 mg via ORAL
  Filled 2020-11-09 (×6): qty 1

## 2020-11-09 MED ORDER — ACETAMINOPHEN 325 MG PO TABS
650.0000 mg | ORAL_TABLET | Freq: Four times a day (QID) | ORAL | Status: DC | PRN
  Administered 2020-11-11 – 2020-11-12 (×2): 650 mg via ORAL
  Filled 2020-11-09 (×2): qty 2

## 2020-11-09 MED ORDER — SODIUM CHLORIDE 0.9 % IV SOLN: INTRAVENOUS | Status: DC

## 2020-11-09 MED ORDER — MOMETASONE FURO-FORMOTEROL FUM 200-5 MCG/ACT IN AERO
2.0000 | INHALATION_SPRAY | Freq: Two times a day (BID) | RESPIRATORY_TRACT | Status: DC
  Administered 2020-11-10 – 2020-11-15 (×12): 2 via RESPIRATORY_TRACT
  Filled 2020-11-09: qty 8.8

## 2020-11-09 MED ORDER — PREGABALIN 50 MG PO CAPS
50.0000 mg | ORAL_CAPSULE | Freq: Three times a day (TID) | ORAL | Status: DC
  Administered 2020-11-10 – 2020-11-15 (×15): 50 mg via ORAL
  Filled 2020-11-09 (×11): qty 1
  Filled 2020-11-09: qty 2
  Filled 2020-11-09 (×3): qty 1

## 2020-11-09 MED ORDER — OXYCODONE HCL 5 MG PO TABS
5.0000 mg | ORAL_TABLET | ORAL | Status: DC | PRN
  Administered 2020-11-10 – 2020-11-14 (×13): 5 mg via ORAL
  Filled 2020-11-09 (×13): qty 1

## 2020-11-09 NOTE — H&P (Incomplete)
History and Physical        Hospital Admission Note Date: 11/09/2020  Patient name: Heather [name] Medical record number: [id number] Date of birth: 09-30-1950 Age: 70 y.o. Gender: female  PCP: [name], FNP    Patient coming from: Urgent Care via EMS   I have reviewed all records in the [facility].    Chief Complaint:  Abdominal Pain and Syncope   HPI: Heather [name] is a 70 y.o. female with PMH of bipolar disorder, T2DM, HTN, CAD, PAD, Schizophrenia who presents for abdominal pain. Reports 8 months of chronic abdominal pain occurring almost daily. Located generalized across middle of stomach. Very severe. Reports occurs after eating. Occasional vomiting. Recurrent diarrhea. Occasional blood in stool.  She was seen in ED in February and treated for diverticulitis with antibiotics. Reports no change in symptoms with course of treatment. Has bene unable to see PCP or GI. Has had >10 lb weight loss.   She has also had generalized weakness and lightheadedness upon standing with multiple episodes of LOC. No head trauma. Per EMS had positive orthostatic vital signs.    ED work-up/course:  ***  Review of Systems: Positives marked in 'bold' Constitutional: Denies fever, chills, diaphoresis, poor appetite and fatigue.  HEENT: Denies photophobia, eye pain, redness, hearing loss, ear pain, congestion, sore throat, rhinorrhea, sneezing, mouth sores, trouble swallowing, neck pain, neck stiffness and tinnitus.   Respiratory: Denies SOB, DOE, cough, chest tightness,  and wheezing.   Cardiovascular: Denies chest pain, palpitations and leg swelling.  Gastrointestinal: Denies nausea, vomiting, abdominal pain, diarrhea, constipation, blood in stool and abdominal distention.  Genitourinary: Denies dysuria, urgency, frequency, hematuria, flank pain and difficulty urinating.  Musculoskeletal: Denies myalgias, back pain, joint swelling,  arthralgias and gait problem.  Skin: Denies pallor, rash and wound.  Neurological: Denies dizziness, seizures, syncope, weakness, light-headedness, numbness and headaches.  Hematological: Denies adenopathy. Easy bruising, personal or family bleeding history  Psychiatric/Behavioral: Denies suicidal ideation, mood changes, confusion, nervousness, sleep disturbance and agitation  Past Medical History: Past Medical History:  Diagnosis Date  . Anxiety   . Bipolar disorder ([place])   . Cancer ([place])    skin  . Depression   . Diabetes mellitus   . Diabetes mellitus, type II ([place])   . History of artificial heart valve   . Schizophrenia ([place])   . Vertigo     Past Surgical History:  Procedure Laterality Date  . ABDOMINAL HYSTERECTOMY    . BACK SURGERY    . CARDIAC VALVE REPLACEMENT    . CHOLECYSTECTOMY    . HEMORRHOID SURGERY    . TUBAL LIGATION      Medications: Prior to Admission medications   Medication Sig Start Date End Date Taking? Authorizing Provider  ACCU-CHEK AVIVA PLUS test strip  11/19/18   [provider]  Accu-Chek Softclix Lancets lancets  11/19/18   [provider]  albuterol (PROVENTIL HFA) 108 (90 Base) MCG/ACT inhaler Inhale into the lungs. 03/09/14   [provider]  Alcohol Swabs (B-D SINGLE USE SWABS REGULAR) PADS  11/19/18   [provider]  aspirin EC 81 MG tablet Take 81 mg by mouth daily.  [provider]  atorvastatin (LIPITOR) 80 MG tablet  08/28/18   [provider]  Blood Glucose Calibration (ACCU-CHEK AVIVA) SOLN  11/20/18   [provider]  Blood Glucose Monitoring Suppl (ACCU-CHEK AVIVA PLUS) w/Device KIT  11/19/18   [provider]  chlorhexidine (PERIDEX) 0.12 % solution Use as directed 15 mLs in the mouth or throat 2 (two) times daily.    [provider]  cholecalciferol (VITAMIN D) 1000 units tablet Take 1,000 Units by mouth daily. Takes 2000 units daily.    [provider]  clopidogrel (PLAVIX) 75 MG tablet  12/10/16   [provider]  cyclobenzaprine (FLEXERIL) 10 MG tablet  09/07/18   [provider]  donepezil (ARICEPT) 5 MG tablet Take 5 mg by mouth at bedtime.    [provider]  escitalopram (LEXAPRO) 20 MG tablet Take 1 tablet (20 mg total) by mouth daily. 01/16/20   Ursula Alert, MD  fluticasone Asencion Islam) 50 MCG/ACT nasal spray  11/02/16   [provider]  Fluticasone-Salmeterol (ADVAIR) 250-50 MCG/DOSE AEPB  10/02/16   [provider]  furosemide (LASIX) 20 MG tablet  08/28/18   [provider]  HYDROcodone-acetaminophen (NORCO/VICODIN) 5-325 MG tablet Take 1 tablet by mouth every 4 (four) hours as needed for moderate pain. 10/06/20 10/06/21  Harvest Dark, MD  Insulin Aspart FlexPen 100 UNIT/ML SOPN  11/19/18   [provider]  insulin glargine (LANTUS) 100 UNIT/ML injection Inject 40 Units into the skin at bedtime.    [provider]  insulin lispro (HUMALOG KWIKPEN) 100 UNIT/ML KwikPen Inject 7-10 Units into the skin.     [provider]  Insulin Syringe-Needle U-100 (MONOJECT INS SYR 1CC/30G) 30G X 5/16" 1 ML MISC  02/19/16   [provider]  losartan (COZAAR) 25 MG tablet Take 25 mg by mouth daily.    [provider]  metoprolol succinate (TOPROL-XL) 25 MG 24 hr tablet  10/02/18   [provider]  Multiple Vitamins-Minerals (WOMENS MULTI VITAMIN & MINERAL PO) Take 1 tablet by mouth every morning.    [provider]  pregabalin (LYRICA) 50 MG capsule Take 50 mg by mouth 3 (three) times daily.    [provider]  traZODone (DESYREL) 50 MG tablet TAKE 1/2 TO 1 TABLET AT BEDTIME AS NEEDED FOR SLEEP 10/15/20   Ursula Alert, MD    Allergies:   Allergies  Allergen Reactions  . Iodinated Diagnostic Agents Hives  . Sulfa Antibiotics   . Shellfish Allergy Rash and Other (See Comments)    Scallops specifically cause  VOMITING Scallops specifically cause VOMITING     Social History:  reports that she has been smoking cigarettes. She has been smoking about 1.50 packs per day. She has never used smokeless tobacco. She reports current alcohol use of about 1.0 - 2.0 standard drink of alcohol per week. She reports previous drug use.  Family History: Family History  Problem Relation Age of Onset  . Mental illness Mother   . Breast cancer Neg Hx     Physical Exam: Blood pressure 136/80, pulse 76, temperature 98.1 F (36.7 C), temperature source Oral, resp. rate 16, height _0  (1.499 m), weight 54.4 kg, SpO2 100 %. General: Alert, awake, oriented x3, in no acute distress. Eyes: pink conjunctiva,anicteric sclera, pupils equal and reactive to light and accomodation, HEENT: normocephalic, atraumatic, oropharynx clear, dry mucus membranes  Neck: supple, no masses or lymphadenopathy, no goiter, no bruits, no JVD CVS: Regular rate and  rhythm, without murmurs, rubs or gallops. No lower extremity edema Resp : Clear to auscultation bilaterally, no wheezing, rales or rhonchi. GI : Soft. No distention. Diffuse TTP. No rebound or guarding.  Musculoskeletal: No clubbing or cyanosis, positive pedal pulses. No contracture. ROM intact  Neuro: Grossly intact, no focal neurological deficits, strength 5/5 upper and lower extremities bilaterally Psych: alert and oriented x 3, normal mood and affect Skin: no rashes or lesions, warm and dry   LABS on Admission: I have personally reviewed all the labs and imagings below    Basic Metabolic Panel: Recent Labs  Lab 11/09/20 1651  NA 133*  K 2.8*  CL 101  CO2 22  GLUCOSE 179*  BUN 25*  CREATININE 1.45*  CALCIUM 8.8*   Liver Function Tests: Recent Labs  Lab 11/09/20 1651  AST 14*  ALT 9  ALKPHOS 78  BILITOT 0.5  PROT 7.1  ALBUMIN 3.4*   Recent Labs  Lab 11/09/20 1651  LIPASE 26   No results for input(s): AMMONIA in the last 168 hours. CBC: Recent  Labs  Lab 11/09/20 1651  WBC 14.9*  HGB 12.8  HCT 38.5  MCV 90.6  PLT 516*   Cardiac Enzymes: No results for input(s): CKTOTAL, CKMB, CKMBINDEX, TROPONINI in the last 168 hours. BNP: Invalid input(s): POCBNP CBG: No results for input(s): GLUCAP in the last 168 hours.  Radiological Exams on Admission:  CT ABDOMEN PELVIS WO CONTRAST  Result Date: 11/09/2020 CLINICAL DATA:  Fall, right side abdominal pain EXAM: CT ABDOMEN AND PELVIS WITHOUT CONTRAST TECHNIQUE: Multidetector CT imaging of the abdomen and pelvis was performed following the standard protocol without IV contrast. COMPARISON:  10/06/2020 FINDINGS: Lower chest: Coronary artery calcifications and aortic calcifications. No acute abnormality. Hepatobiliary: No focal liver abnormality is seen. Status post cholecystectomy. No biliary dilatation. Pancreas: No focal abnormality or ductal dilatation. Spleen: No focal abnormality.  Normal size. Adrenals/Urinary Tract: No adrenal abnormality. No focal renal abnormality. No stones or hydronephrosis. Urinary bladder is unremarkable. Stomach/Bowel: Normal appendix. Stomach, large and small bowel grossly unremarkable. Vascular/Lymphatic: Heavily calcified aorta and iliac vessels. No evidence of aneurysm or adenopathy. Reproductive: Uterus and adnexa unremarkable.  No mass. Other: No free fluid or free air. Musculoskeletal: No acute bony abnormality. IMPRESSION: No evidence of solid organ injury. Heavily calcified aorta and branch vessels. Sigmoid diverticulosis. Electronically Signed   By: Rolm Baptise M.D.   On: 11/09/2020 19:16   DG Chest 2 View  Result Date: 11/09/2020 CLINICAL DATA:  Chest pain after fall EXAM: CHEST - 2 VIEW COMPARISON:  March 09, 2012 FINDINGS: The heart size and mediastinal contours are within normal limits. Both lungs are clear. The visualized skeletal structures are unremarkable. IMPRESSION: No active cardiopulmonary disease. Electronically Signed   By: Dorise Bullion III  M.D   On: 11/09/2020 19:24      EKG: Independently reviewed. NSR. No ST segment changes.    Assessment/Plan Active Problems:   (HFpEF) heart failure with preserved ejection fraction (HCC)   CAD (coronary artery disease)   Diabetes mellitus (HCC)   Hypertension, benign   Atherosclerosis of native arteries of extremity with intermittent claudication (HCC)   S/P TAVR (transcatheter aortic valve replacement)   Abdominal pain   Syncope   Hypokalemia   AKI (acute kidney injury) ([place])  Abdominal Pain Patient presenting with chronic, diffuse, severe abdominal pain related to PO intake with associated diarrhea, vomiting, and weight loss. Treated for diverticulitis 1 month prior without improvement in symptoms. Given the above,  do have concern for chronic mesenteric ischemia. Unfortunately, patient has a contrast allergy so only able to obtain CT without contrast. This was significant for heavily calcified aorta and branch vessels.  -admit to telemetry, vital signs per unit  -would consider ultrasound in AM for further evaluation of colonic vasculature  -pain control with Tylenol for mild pain, Oxycodone for severe pain  -likely needs GI consult, RN in ED stated that ER provider had consulted but this was not signed out to this admitting provider   Hypokalemia  K 2.8 likely related to gastrointestinal losses.  -replete with Kdur 40 mEq x2 -repeat K at 0500  -order Mg  -Hold Lasix   AKI  Cr 1.41, baseline appears to be around 0.6. Likely prerenal from poor PO intake and gastrointestinal losses.  -Hold Losartan  -avoid nephrotoxic agents  -s/p 1L NS bolus, mIVF at 125 cc/hr  -monitor Cr in AM   Syncope  Suspect related to orthostatic hypotension from volume depletion.  -obtain orthostatic vital signs  -fluid resuscitation as above  -hold BP medications  -fall precautions  -monitor to determine if further work up is needed   HTN  BP stable at admission.  -given orthostatic  hypotension reported and volume depletion, hold home antihypertensives   T2DM     Abnormal UA   DVT prophylaxis:   CODE STATUS:   Consults called:   Admission status:   The medical decision making on this patient was of high complexity and the patient is at high risk for clinical deterioration, therefore this is a level 3 admission.  Severity of Illness:     *** {Observation/Inpatient:21159}   Time Spent on Admission: ***     Melina Schools D.O.  [facility] 11/09/2020, 10:37 PM

## 2020-11-09 NOTE — ED Triage Notes (Signed)
First RN Note: Pt to ED via [facility] from [facility], per EMS pt with fall this morning, no obvious injury, c/o R side pain with palpation, per EMS pt seen at [facility] for fall, per EMS pt + for orthostatics, 110/78 sitting, standing 78 systolic, per EMS reports repeat sitting BP 264 systolic. EMS reports that patient's HR increased from 82 to 100 with standing. Per EMS pt also c/o dark diarrhea x several weeks.EMS also reports CBG 212.

## 2020-11-09 NOTE — ED Triage Notes (Signed)
Pt arrives to ER c/o of central abd and diarrhea x 8 months. Pt states she had a syncopal episode this AM. Denies fever and N&V. "every once in a while" when asked about blood in diarrhea.

## 2020-11-09 NOTE — ED Provider Notes (Signed)
[facility] Emergency Department Provider Note  ____________________________________________   Event Date/Time   First MD Initiated Contact with Patient 11/09/20 1839     (approximate)  I have reviewed the triage vital signs and the nursing notes.   HISTORY  Chief Complaint Abdominal Pain and Loss of Consciousness    HPI Heather [name] is a 70 y.o. female  With h/o bipolar d/o, dm, schizophrenia, here with abd pain and weakness. Pt reports 2 months of progressively worsening diarrhea, weakness. Seen in February here and dx with Diverticulitis - took ABX and had minimal improvement. She states she has had ongoing diarrhea since then as well as worsening weakness, nausea, loss of appetite. Has lost >10 lb. Reports she has been trying to see her PCP and GI but has not been able to get an appt. She reports that over the past week, she's had worsening generalized weakness along with lightheadedness upon standing and multiple episodes of passing out while standing. Hit her R chest after passing out today. She reports some sharp,s tabbing R chest wall pain since the fall. Does not believe she hit her head and has no headache or neck pain.       Past Medical History:  Diagnosis Date  . Anxiety   . Bipolar disorder ([place])   . Cancer ([place])    skin  . Depression   . Diabetes mellitus   . Diabetes mellitus, type II ([place])   . History of artificial heart valve   . Schizophrenia ([place])   . Vertigo     Patient Active Problem List   Diagnosis Date Noted  . MDD (major depressive disorder), recurrent, in full remission ([place]) 01/16/2020  . Anxiety disorder 01/16/2020  . Hyperlipidemia 12/15/2019  . Carotid stenosis 12/15/2019  . MDD (major depressive disorder), recurrent, in partial remission ([place]) 08/24/2019  . MDD (major depressive disorder), recurrent episode, mild ([place]) 04/08/2019  . Insomnia due to mental condition 04/08/2019  . Major neurocognitive disorder due  to Alzheimer's disease, probable, without behavioral disturbance 04/08/2019  . DDD (degenerative disc disease), cervical 12/19/2016  . Frequent falls 12/10/2016  . Uncontrolled type 2 diabetes mellitus with insulin therapy ([place]) 12/10/2016  . 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 Nonrheumatic aortic valve stenosis 09/30/2016  . (HFpEF) heart failure with preserved ejection fraction ([place]) 09/20/2016  . COPD (chronic obstructive pulmonary disease) ([place]) 09/20/2016  . Respiratory failure with hypoxia ([place]) 09/20/2016  . NSTEMI (non-ST elevated myocardial infarction) ([place]) 08/21/2016  . SOB (shortness of breath) 08/18/2016  . Atherosclerosis of native arteries of extremity with intermittent claudication ([place]) 01/08/2016  . Palpitations 07/10/2015  . History of nonmelanoma skin cancer 03/16/2015  . Pseudoaneurysm following procedure ([place]) 07/19/2014  . 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 Claudication ([place]) 06/05/2014  . Aortic insufficiency 03/20/2014  . CAD (coronary artery disease) 03/20/2014  . Chronic pain 03/20/2014  . Memory loss 11/09/2012  . Depressive disorder 08/02/2012  . Neck pain 08/02/2012  . Malaise and fatigue 08/02/2012  . Hypertension, benign 08/02/2012  . Dizziness 08/02/2012  . Polyneuropathy in diabetes ([place]) 08/02/2012  . Rheumatic fever with heart involvement 08/02/2012  . Tobacco use disorder 08/02/2012  . Diabetes mellitus ([place]) 08/02/1990    Past Surgical History:  Procedure Laterality Date  . ABDOMINAL HYSTERECTOMY    . BACK SURGERY    . CARDIAC VALVE REPLACEMENT    . CHOLECYSTECTOMY    . HEMORRHOID SURGERY    . TUBAL LIGATION  Prior to Admission medications   Medication Sig Start Date End Date Taking? Authorizing Provider  ACCU-CHEK AVIVA PLUS test strip  11/19/18   [provider]  Accu-Chek Softclix Lancets lancets  11/19/18   [provider]  albuterol (PROVENTIL HFA) 108 (90 Base)  MCG/ACT inhaler Inhale into the lungs. 03/09/14   [provider]  Alcohol Swabs (B-D SINGLE USE SWABS REGULAR) PADS  11/19/18   [provider]  aspirin EC 81 MG tablet Take 81 mg by mouth daily.    [provider]  atorvastatin (LIPITOR) 80 MG tablet  08/28/18   [provider]  Blood Glucose Calibration (ACCU-CHEK AVIVA) SOLN  11/20/18   [provider]  Blood Glucose Monitoring Suppl (ACCU-CHEK AVIVA PLUS) w/Device KIT  11/19/18   [provider]  chlorhexidine (PERIDEX) 0.12 % solution Use as directed 15 mLs in the mouth or throat 2 (two) times daily.    [provider]  cholecalciferol (VITAMIN D) 1000 units tablet Take 1,000 Units by mouth daily. Takes 2000 units daily.    [provider]  clopidogrel (PLAVIX) 75 MG tablet  12/10/16   [provider]  cyclobenzaprine (FLEXERIL) 10 MG tablet  09/07/18   [provider]  donepezil (ARICEPT) 5 MG tablet Take 5 mg by mouth at bedtime.    [provider]  escitalopram (LEXAPRO) 20 MG tablet Take 1 tablet (20 mg total) by mouth daily. 01/16/20   Ursula Alert, MD  fluticasone Asencion Islam) 50 MCG/ACT nasal spray  11/02/16   [provider]  Fluticasone-Salmeterol (ADVAIR) 250-50 MCG/DOSE AEPB  10/02/16   [provider]  furosemide (LASIX) 20 MG tablet  08/28/18   [provider]  HYDROcodone-acetaminophen (NORCO/VICODIN) 5-325 MG tablet Take 1 tablet by mouth every 4 (four) hours as needed for moderate pain. 10/06/20 10/06/21  Harvest Dark, MD  Insulin Aspart FlexPen 100 UNIT/ML SOPN  11/19/18   [provider]  insulin glargine (LANTUS) 100 UNIT/ML injection Inject 40 Units into the skin at bedtime.    [provider]  insulin lispro (HUMALOG KWIKPEN) 100 UNIT/ML KwikPen Inject 7-10 Units into the skin.     [provider]  Insulin Syringe-Needle U-100 (MONOJECT INS SYR 1CC/30G) 30G X 5/16" 1 ML MISC   02/19/16   [provider]  losartan (COZAAR) 25 MG tablet Take 25 mg by mouth daily.    [provider]  metoprolol succinate (TOPROL-XL) 25 MG 24 hr tablet  10/02/18   [provider]  Multiple Vitamins-Minerals (WOMENS MULTI VITAMIN & MINERAL PO) Take 1 tablet by mouth every morning.    [provider]  pregabalin (LYRICA) 50 MG capsule Take 50 mg by mouth 3 (three) times daily.    [provider]  traZODone (DESYREL) 50 MG tablet TAKE 1/2 TO 1 TABLET AT BEDTIME AS NEEDED FOR SLEEP 10/15/20   Ursula Alert, MD    Allergies Iodinated diagnostic agents, Sulfa antibiotics, and Shellfish allergy  Family History  Problem Relation Age of Onset  . Mental illness Mother   . Breast cancer Neg Hx     Social History Social History   Tobacco Use  . Smoking status: Current Every Day Smoker    Packs/day: 1.50    Types: Cigarettes  . Smokeless tobacco: Never Used  Vaping Use  . Vaping Use: Never used  Substance Use Topics  . Alcohol use: Yes    Alcohol/week: 1.0 - 2.0 standard drink    Types: 1 - 2  Cans of beer per week  . Drug use: Not Currently    Review of Systems  Review of Systems  Constitutional: Positive for fatigue. Negative for fever.  HENT: Negative for congestion and sore throat.   Eyes: Negative for visual disturbance.  Respiratory: Negative for cough and shortness of breath.   Cardiovascular: Negative for chest pain.  Gastrointestinal: Positive for abdominal pain, diarrhea, nausea and vomiting.  Genitourinary: Negative for flank pain.  Musculoskeletal: Negative for back pain and neck pain.  Skin: Negative for rash and wound.  Neurological: Positive for syncope and weakness.  All other systems reviewed and are negative.    ____________________________________________  PHYSICAL EXAM:      VITAL SIGNS: ED Triage Vitals  Enc Vitals Group     BP 11/09/20 1648 108/66     Pulse Rate 11/09/20 1648 82     Resp 11/09/20 1648  16     Temp 11/09/20 1648 98.1 F (36.7 C)     Temp Source 11/09/20 1648 Oral     SpO2 11/09/20 1648 100 %     Weight 11/09/20 1649 120 lb (54.4 kg)     Height 11/09/20 1649 '4\' 11"'  (1.499 m)     Head Circumference --      Peak Flow --      Pain Score 11/09/20 1649 9     Pain Loc --      Pain Edu? --      Excl. in [place]? --      Physical Exam Vitals and nursing note reviewed.  Constitutional:      General: She is not in acute distress.    Appearance: She is well-developed.  HENT:     Head: Normocephalic and atraumatic.     Comments: Dry mm Eyes:     Conjunctiva/sclera: Conjunctivae normal.  Cardiovascular:     Rate and Rhythm: Normal rate and regular rhythm.     Heart sounds: Normal heart sounds. No murmur heard. No friction rub.  Pulmonary:     Effort: Pulmonary effort is normal. No respiratory distress.     Breath sounds: Normal breath sounds. No wheezing or rales.  Abdominal:     General: There is no distension.     Palpations: Abdomen is soft.     Tenderness: There is no abdominal tenderness.     Comments: Diffuse midline TTP, worse in lower abdomen  Musculoskeletal:     Cervical back: Neck supple.  Skin:    General: Skin is warm.     Capillary Refill: Capillary refill takes less than 2 seconds.  Neurological:     Mental Status: She is alert and oriented to person, place, and time.     Motor: No abnormal muscle tone.       ____________________________________________   LABS (all labs ordered are listed, but only abnormal results are displayed)  Labs Reviewed  COMPREHENSIVE METABOLIC PANEL - Abnormal; Notable for the following components:      Result Value   Sodium 133 (*)    Potassium 2.8 (*)    Glucose, Bld 179 (*)    BUN 25 (*)    Creatinine, Ser 1.45 (*)    Calcium 8.8 (*)    Albumin 3.4 (*)    AST 14 (*)    GFR, Estimated 40 (*)    All other components within normal limits  CBC - Abnormal; Notable for the following components:   WBC 14.9 (*)     Platelets 516 (*)    All  other components within normal limits  URINALYSIS, COMPLETE (UACMP) WITH MICROSCOPIC - Abnormal; Notable for the following components:   Color, Urine YELLOW (*)    APPearance CLOUDY (*)    Leukocytes,Ua MODERATE (*)    Bacteria, UA RARE (*)    All other components within normal limits  LIPASE, BLOOD    ____________________________________________  EKG: Normal sinus rhythm, VR 79. PR 156, QRS 86, QTc 465,. No acute st elevations or depressions. ________________________________________  RADIOLOGY All imaging, including plain films, CT scans, and ultrasounds, independently reviewed by me, and interpretations confirmed via formal radiology reads.  ED MD interpretation:   CT A/P: No acute abnormality, heavily calcified vessels  Official radiology report(s): No results found.  ____________________________________________  PROCEDURES   Procedure(s) performed (including Critical Care):  .1-3 Lead EKG Interpretation Performed by: Duffy Bruce, MD Authorized by: Duffy Bruce, MD     Interpretation: normal     ECG rate:  70-90s   ECG rate assessment: normal     Rhythm: sinus rhythm     Ectopy: none     Conduction: normal   Comments:     Indication: Weakness    ____________________________________________  INITIAL IMPRESSION / MDM / ASSESSMENT AND PLAN / ED COURSE  As part of my medical decision making, I reviewed the following data within the [facility] notes reviewed and incorporated, Old chart reviewed, Notes from prior ED visits, and [place] Controlled Substance Database       *Heather [name] was evaluated in Emergency Department on 11/09/2020 for the symptoms described in the history of present illness. She was evaluated in the context of the global COVID-19 pandemic, which necessitated consideration that the patient might be at risk for infection with the SARS-CoV-2 virus that causes COVID-19. Institutional protocols  and algorithms that pertain to the evaluation of patients at risk for COVID-19 are in a state of rapid change based on information released by regulatory bodies including the CDC and federal and state organizations. These policies and algorithms were followed during the patient's care in the ED.  Some ED evaluations and interventions may be delayed as a result of limited staffing during the pandemic.*     Medical Decision Making: 89 7 old female here with nausea, vomiting, intermittent diarrhea, and abdominal pain.  Lab work is consistent with significant dehydration with elevated BUN to creatinine as well as hypokalemia.  UA also shows possible UTI.  Patient given fluids, electrolyte replacement.  Patient has had significant weakness and recurrent falls as well as syncope related to this.  EKG nonischemic and no ectopy noted on her telemetry.  I suspect this is related to her dehydration and orthostasis.  However, given her weight loss, syncope, dehydration, and significant pain along with atherosclerosis noted on CT, query mesenteric ischemia.  Will admit for fluids and further work-up.  ____________________________________________  FINAL CLINICAL IMPRESSION(S) / ED DIAGNOSES  Final diagnoses:  None     MEDICATIONS GIVEN DURING THIS VISIT:  Medications - No data to display   ED Discharge Orders    None       Note:  This document was prepared using Dragon voice recognition software and may include unintentional dictation errors.   Duffy Bruce, MD 11/10/20 [phone]

## 2020-11-09 NOTE — H&P (Signed)
History and Physical        Hospital Admission Note Date: 11/09/2020  Patient name: Heather [name] Medical record number: [id number] Date of birth: 1951-01-11 Age: 70 y.o. Gender: female  PCP: [name], FNP    Patient coming from: Urgent Care via EMS   I have reviewed all records in the [facility]    Chief Complaint:  Abdominal Pain and Syncope   HPI: Heather [name] is a 70 y.o. female with PMH of bipolar disorder, T2DM, HTN, CAD, PAD, Schizophrenia who presents for abdominal pain. Reports 8 months of chronic abdominal pain occurring almost daily. Located generalized across middle of stomach. Very severe. Reports occurs after eating. Occasional vomiting. Recurrent diarrhea. Occasional blood in stool.  She was seen in ED in February and treated for diverticulitis with antibiotics. Reports no change in symptoms with course of treatment. Has bene unable to see PCP or GI. Has had >10 lb weight loss.   She has also had generalized weakness and lightheadedness upon standing with multiple episodes of LOC. No head trauma. Per EMS had positive orthostatic vital signs.    ED work-up/course:  Patient had labs significant for K 2.7 and elevated Cr significant for AKI. CT abdomen obtained neg for acute process but with heavily calcified aorta and branch vessels. CXR negative.   Review of Systems: Positives marked in 'bold' Constitutional: Denies fever, chills, diaphoresis, poor appetite and fatigue.  HEENT: Denies photophobia, eye pain, redness, hearing loss, ear pain, congestion, sore throat, rhinorrhea, sneezing, mouth sores, trouble swallowing, neck pain, neck stiffness and tinnitus.   Respiratory: Denies SOB, DOE, cough, chest tightness,  and wheezing.   Cardiovascular: Denies chest pain, palpitations and leg swelling.  Gastrointestinal: Denies nausea, vomiting, abdominal pain, diarrhea, constipation, blood in stool and  abdominal distention.  Genitourinary: Denies dysuria, urgency, frequency, hematuria, flank pain and difficulty urinating.  Musculoskeletal: Denies myalgias, back pain, joint swelling, arthralgias and gait problem.  Skin: Denies pallor, rash and wound.  Neurological: Denies dizziness, seizures, syncope, weakness, light-headedness, numbness and headaches.  Hematological: Denies adenopathy. Easy bruising, personal or family bleeding history  Psychiatric/Behavioral: Denies suicidal ideation, mood changes, confusion, nervousness, sleep disturbance and agitation  Past Medical History: Past Medical History:  Diagnosis Date  . Anxiety   . Bipolar disorder ([place])   . Cancer ([place])    skin  . Depression   . Diabetes mellitus   . Diabetes mellitus, type II ([place])   . History of artificial heart valve   . Schizophrenia ([place])   . Vertigo     Past Surgical History:  Procedure Laterality Date  . ABDOMINAL HYSTERECTOMY    . BACK SURGERY    . CARDIAC VALVE REPLACEMENT    . CHOLECYSTECTOMY    . HEMORRHOID SURGERY    . TUBAL LIGATION      Medications: Prior to Admission medications   Medication Sig Start Date End Date Taking? Authorizing Provider  ACCU-CHEK AVIVA PLUS test strip  11/19/18   [provider]  Accu-Chek Softclix Lancets lancets  11/19/18   [provider]  albuterol (PROVENTIL HFA) 108 (90 Base) MCG/ACT inhaler Inhale into the lungs. 03/09/14   [provider]  Alcohol Swabs (B-D SINGLE USE SWABS REGULAR) PADS  11/19/18   [provider]  aspirin EC 81 MG tablet Take 81 mg by mouth daily.    [provider]  atorvastatin (LIPITOR) 80 MG tablet  08/28/18   [provider]  Blood Glucose Calibration (ACCU-CHEK AVIVA) SOLN  11/20/18   [provider]  Blood Glucose Monitoring Suppl (ACCU-CHEK AVIVA PLUS) w/Device KIT  11/19/18   [provider]  chlorhexidine (PERIDEX) 0.12 % solution Use as directed 15 mLs in the mouth  or throat 2 (two) times daily.    [provider]  cholecalciferol (VITAMIN D) 1000 units tablet Take 1,000 Units by mouth daily. Takes 2000 units daily.    [provider]  clopidogrel (PLAVIX) 75 MG tablet  12/10/16   [provider]  cyclobenzaprine (FLEXERIL) 10 MG tablet  09/07/18   [provider]  donepezil (ARICEPT) 5 MG tablet Take 5 mg by mouth at bedtime.    [provider]  escitalopram (LEXAPRO) 20 MG tablet Take 1 tablet (20 mg total) by mouth daily. 01/16/20   Ursula Alert, MD  fluticasone Asencion Islam) 50 MCG/ACT nasal spray  11/02/16   [provider]  Fluticasone-Salmeterol (ADVAIR) 250-50 MCG/DOSE AEPB  10/02/16   [provider]  furosemide (LASIX) 20 MG tablet  08/28/18   [provider]  HYDROcodone-acetaminophen (NORCO/VICODIN) 5-325 MG tablet Take 1 tablet by mouth every 4 (four) hours as needed for moderate pain. 10/06/20 10/06/21  Harvest Dark, MD  Insulin Aspart FlexPen 100 UNIT/ML SOPN  11/19/18   [provider]  insulin glargine (LANTUS) 100 UNIT/ML injection Inject 40 Units into the skin at bedtime.    [provider]  insulin lispro (HUMALOG KWIKPEN) 100 UNIT/ML KwikPen Inject 7-10 Units into the skin.     [provider]  Insulin Syringe-Needle U-100 (MONOJECT INS SYR 1CC/30G) 30G X 5/16" 1 ML MISC  02/19/16   [provider]  losartan (COZAAR) 25 MG tablet Take 25 mg by mouth daily.    [provider]  metoprolol succinate (TOPROL-XL) 25 MG 24 hr tablet  10/02/18   [provider]  Multiple Vitamins-Minerals (WOMENS MULTI VITAMIN & MINERAL PO) Take 1 tablet by mouth every morning.    [provider]  pregabalin (LYRICA) 50 MG capsule Take 50 mg by mouth 3 (three) times daily.    [provider]  traZODone (DESYREL) 50 MG tablet TAKE 1/2 TO 1 TABLET AT BEDTIME AS NEEDED FOR SLEEP 10/15/20   Ursula Alert, MD    Allergies:    Allergies  Allergen Reactions  . Iodinated Diagnostic Agents Hives  . Sulfa Antibiotics   . Shellfish Allergy Rash and Other (See Comments)    Scallops specifically cause VOMITING Scallops specifically cause VOMITING     Social History:  reports that she has been smoking cigarettes. She has been smoking about 1.50 packs per day. She has never used smokeless tobacco. She reports current alcohol use of about 1.0 - 2.0 standard drink of alcohol per week. She reports previous drug use.  Family History: Family History  Problem Relation Age of Onset  . Mental illness Mother   . Breast cancer Neg Hx     Physical Exam: Blood pressure 136/80, pulse 76, temperature 98.1 F (36.7 C), temperature source Oral, resp. rate 16, height _0  (1.499 m), weight 54.4 kg, SpO2 100 %. General: Alert, awake, oriented x3, in no acute distress. Eyes: pink conjunctiva,anicteric sclera, pupils equal and  reactive to light and accomodation, HEENT: normocephalic, atraumatic, oropharynx clear, dry mucus membranes  Neck: supple, no masses or lymphadenopathy, no goiter, no bruits, no JVD CVS: Regular rate and rhythm, without murmurs, rubs or gallops. No lower extremity edema Resp : Clear to auscultation bilaterally, no wheezing, rales or rhonchi. GI : Soft. No distention. Diffuse TTP. No rebound or guarding.  Musculoskeletal: No clubbing or cyanosis, positive pedal pulses. No contracture. ROM intact  Neuro: Grossly intact, no focal neurological deficits, strength 5/5 upper and lower extremities bilaterally Psych: alert and oriented x 3, normal mood and affect Skin: no rashes or lesions, warm and dry   LABS on Admission: I have personally reviewed all the labs and imagings below    Basic Metabolic Panel: Recent Labs  Lab 11/09/20 1651  NA 133*  K 2.8*  CL 101  CO2 22  GLUCOSE 179*  BUN 25*  CREATININE 1.45*  CALCIUM 8.8*   Liver Function Tests: Recent Labs  Lab 11/09/20 1651  AST 14*  ALT 9   ALKPHOS 78  BILITOT 0.5  PROT 7.1  ALBUMIN 3.4*   Recent Labs  Lab 11/09/20 1651  LIPASE 26   No results for input(s): AMMONIA in the last 168 hours. CBC: Recent Labs  Lab 11/09/20 1651  WBC 14.9*  HGB 12.8  HCT 38.5  MCV 90.6  PLT 516*   Cardiac Enzymes: No results for input(s): CKTOTAL, CKMB, CKMBINDEX, TROPONINI in the last 168 hours. BNP: Invalid input(s): POCBNP CBG: No results for input(s): GLUCAP in the last 168 hours.  Radiological Exams on Admission:  CT ABDOMEN PELVIS WO CONTRAST  Result Date: 11/09/2020 CLINICAL DATA:  Fall, right side abdominal pain EXAM: CT ABDOMEN AND PELVIS WITHOUT CONTRAST TECHNIQUE: Multidetector CT imaging of the abdomen and pelvis was performed following the standard protocol without IV contrast. COMPARISON:  10/06/2020 FINDINGS: Lower chest: Coronary artery calcifications and aortic calcifications. No acute abnormality. Hepatobiliary: No focal liver abnormality is seen. Status post cholecystectomy. No biliary dilatation. Pancreas: No focal abnormality or ductal dilatation. Spleen: No focal abnormality.  Normal size. Adrenals/Urinary Tract: No adrenal abnormality. No focal renal abnormality. No stones or hydronephrosis. Urinary bladder is unremarkable. Stomach/Bowel: Normal appendix. Stomach, large and small bowel grossly unremarkable. Vascular/Lymphatic: Heavily calcified aorta and iliac vessels. No evidence of aneurysm or adenopathy. Reproductive: Uterus and adnexa unremarkable.  No mass. Other: No free fluid or free air. Musculoskeletal: No acute bony abnormality. IMPRESSION: No evidence of solid organ injury. Heavily calcified aorta and branch vessels. Sigmoid diverticulosis. Electronically Signed   By: Rolm Baptise M.D.   On: 11/09/2020 19:16   DG Chest 2 View  Result Date: 11/09/2020 CLINICAL DATA:  Chest pain after fall EXAM: CHEST - 2 VIEW COMPARISON:  March 09, 2012 FINDINGS: The heart size and mediastinal contours are within normal  limits. Both lungs are clear. The visualized skeletal structures are unremarkable. IMPRESSION: No active cardiopulmonary disease. Electronically Signed   By: Dorise Bullion III M.D   On: 11/09/2020 19:24      EKG: Independently reviewed. NSR. No ST segment changes.    Assessment/Plan Active Problems:   (HFpEF) heart failure with preserved ejection fraction (HCC)   CAD (coronary artery disease)   Diabetes mellitus (HCC)   Hypertension, benign   Atherosclerosis of native arteries of extremity with intermittent claudication (HCC)   S/P TAVR (transcatheter aortic valve replacement)   Abdominal pain   Syncope   Hypokalemia   AKI (acute kidney injury) ([place])  Abdominal Pain Patient presenting  with chronic, diffuse, severe abdominal pain related to PO intake with associated diarrhea, vomiting, and weight loss. Treated for diverticulitis 1 month prior without improvement in symptoms. Given the above, do have concern for chronic mesenteric ischemia. Unfortunately, patient has a contrast allergy so only able to obtain CT without contrast. This was significant for heavily calcified aorta and branch vessels.  -admit to telemetry, vital signs per unit  -would consider ultrasound in AM for further evaluation of colonic vasculature  -pain control with Tylenol for mild pain, Oxycodone for severe pain  -likely needs GI consult, RN in ED stated that ER provider had consulted but this was not signed out to this admitting provider  -patient reported intermittent mild rectal bleeding, HgB is stable--will need to determine if patient has had recent colonoscopy and if another is warranted   Hypokalemia  K 2.8 likely related to gastrointestinal losses.  -replete with Kdur 40 mEq x2 -repeat K at 0500  -order Mg  -Hold Lasix   AKI  Cr 1.41, baseline appears to be around 0.6. Likely prerenal from poor PO intake and gastrointestinal losses.  -Hold Losartan  -avoid nephrotoxic agents  -s/p 1L NS bolus,  mIVF at 125 cc/hr  -monitor Cr in AM   Syncope  Suspect related to orthostatic hypotension from volume depletion.  -obtain orthostatic vital signs  -fluid resuscitation as above  -hold BP medications  -fall precautions  -monitor to determine if further work up is needed   HTN  BP stable at admission.  -given orthostatic hypotension reported and volume depletion, hold home antihypertensives   T2DM  No prior HgB A1c available for review.  -Lantus 20u daily  -SSI  -CBG monitoring   HFpEF  Volume depleted at admission.  -hold Lasix  -monitor volume status closely  -strict I/Os   CAD/HLD  -continue Lipitor   Abnormal UA  UA obtained at admission shows moderate leukocytes, negative nitrites, rare bacteria, 6-10 RBCs, and 21-50 WBCs. Appears to be a dirty specimen as has 11-20 squamous epithelial cells as well. Patient has no urinary symptoms. Was given CTX by ED provider for presumed UTI.   -hold abx for now as has no symptoms of cystitis  -urine culture pending   DVT prophylaxis: Lovenox (if rectal bleeding occurs will hold)   CODE STATUS: FULL   Consults called: ?GI by ED provider   Admission status: Inpatient   The medical decision making on this patient was of high complexity and the patient is at high risk for clinical deterioration, therefore this is a level 3 admission.  Severity of Illness:      The appropriate patient status for this patient is INPATIENT. Inpatient status is judged to be reasonable and necessary in order to provide the required intensity of service to ensure the patient's safety. The patient's presenting symptoms, physical exam findings, and initial radiographic and laboratory data in the context of their chronic comorbidities is felt to place them at high risk for further clinical deterioration. Furthermore, it is not anticipated that the patient will be medically stable for discharge from the hospital within 2 midnights of admission. The following  factors support the patient status of inpatient.   " The patient's presenting symptoms include abdominal pain, vomiting, diarrhea. " The worrisome physical exam findings include abdomen TTP, dry MM. " The initial radiographic and laboratory data are worrisome because of K 2.7, Cr 1.4, CT abdomen with significant calcifications of vasculature. " The chronic co-morbidities include ipolar disorder, T2DM, HTN, CAD, PAD,  Schizophrenia .   * I certify that at the point of admission it is my clinical judgment that the patient will require inpatient hospital care spanning beyond 2 midnights from the point of admission due to high intensity of service, high risk for further deterioration and high frequency of surveillance required.*    Time Spent on Admission: 54 minutes      Melina Schools D.O.  [facility] 11/09/2020, 10:37 PM

## 2020-11-10 ENCOUNTER — Inpatient Hospital Stay: Payer: Medicare HMO

## 2020-11-10 ENCOUNTER — Encounter: Payer: Self-pay | Admitting: Internal Medicine

## 2020-11-10 DIAGNOSIS — R8271 Bacteriuria: Secondary | ICD-10-CM | POA: Diagnosis not present

## 2020-11-10 DIAGNOSIS — R109 Unspecified abdominal pain: Secondary | ICD-10-CM | POA: Diagnosis not present

## 2020-11-10 DIAGNOSIS — N179 Acute kidney failure, unspecified: Secondary | ICD-10-CM

## 2020-11-10 DIAGNOSIS — I5030 Unspecified diastolic (congestive) heart failure: Secondary | ICD-10-CM

## 2020-11-10 DIAGNOSIS — R55 Syncope and collapse: Secondary | ICD-10-CM

## 2020-11-10 LAB — BASIC METABOLIC PANEL
Anion gap: 8 (ref 5–15)
BUN: 21 mg/dL (ref 8–23)
CO2: 20 mmol/L — ABNORMAL LOW (ref 22–32)
Calcium: 8.3 mg/dL — ABNORMAL LOW (ref 8.9–10.3)
Chloride: 109 mmol/L (ref 98–111)
Creatinine, Ser: 1.13 mg/dL — ABNORMAL HIGH (ref 0.44–1.00)
GFR, Estimated: 53 mL/min — ABNORMAL LOW (ref >60)
Glucose, Bld: 142 mg/dL — ABNORMAL HIGH (ref 70–99)
Potassium: 3.1 mmol/L — ABNORMAL LOW (ref 3.5–5.1)
Sodium: 137 mmol/L (ref 135–145)

## 2020-11-10 LAB — HEMOGLOBIN A1C
Hgb A1c MFr Bld: 8.2 % — ABNORMAL HIGH (ref 4.8–5.6)
Mean Plasma Glucose: 188.64 mg/dL

## 2020-11-10 LAB — CBC
HCT: 33.8 % — ABNORMAL LOW (ref 36.0–46.0)
Hemoglobin: 10.9 g/dL — ABNORMAL LOW (ref 12.0–15.0)
MCH: 29.9 pg (ref 26.0–34.0)
MCHC: 32.2 g/dL (ref 30.0–36.0)
MCV: 92.6 fL (ref 80.0–100.0)
Platelets: 484 10*3/uL — ABNORMAL HIGH (ref 150–400)
RBC: 3.65 MIL/uL — ABNORMAL LOW (ref 3.87–5.11)
RDW: 14.3 % (ref 11.5–15.5)
WBC: 16.3 10*3/uL — ABNORMAL HIGH (ref 4.0–10.5)
nRBC: 0 % (ref 0.0–0.2)

## 2020-11-10 LAB — MAGNESIUM: Magnesium: 1.3 mg/dL — ABNORMAL LOW (ref 1.7–2.4)

## 2020-11-10 LAB — HIV ANTIBODY (ROUTINE TESTING W REFLEX): HIV Screen 4th Generation wRfx: NONREACTIVE

## 2020-11-10 LAB — GLUCOSE, CAPILLARY
Glucose-Capillary: 82 mg/dL (ref 70–99)
Glucose-Capillary: 286 mg/dL — ABNORMAL HIGH (ref 70–99)
Glucose-Capillary: 303 mg/dL — ABNORMAL HIGH (ref 70–99)

## 2020-11-10 LAB — SARS CORONAVIRUS 2 (TAT 6-24 HRS): SARS Coronavirus 2: NEGATIVE

## 2020-11-10 LAB — CBG MONITORING, ED: Glucose-Capillary: 78 mg/dL (ref 70–99)

## 2020-11-10 MED ORDER — DIPHENHYDRAMINE HCL 50 MG/ML IJ SOLN
50.0000 mg | Freq: Once | INTRAMUSCULAR | Status: AC
  Filled 2020-11-10: qty 1

## 2020-11-10 MED ORDER — DIPHENHYDRAMINE HCL 25 MG PO CAPS
50.0000 mg | ORAL_CAPSULE | Freq: Once | ORAL | Status: AC
  Administered 2020-11-10: 50 mg via ORAL
  Filled 2020-11-10: qty 2

## 2020-11-10 MED ORDER — PREDNISONE 50 MG PO TABS
50.0000 mg | ORAL_TABLET | Freq: Four times a day (QID) | ORAL | Status: AC
  Administered 2020-11-10 (×3): 50 mg via ORAL
  Filled 2020-11-10: qty 1
  Filled 2020-11-10: qty 2
  Filled 2020-11-10: qty 1

## 2020-11-10 MED ORDER — IOHEXOL 350 MG/ML SOLN
100.0000 mL | Freq: Once | INTRAVENOUS | Status: AC | PRN
  Administered 2020-11-10: 100 mL via INTRAVENOUS

## 2020-11-10 MED ORDER — ENOXAPARIN SODIUM 40 MG/0.4ML ~~LOC~~ SOLN
40.0000 mg | Freq: Every day | SUBCUTANEOUS | Status: DC
  Administered 2020-11-10 – 2020-11-14 (×5): 40 mg via SUBCUTANEOUS
  Filled 2020-11-10 (×5): qty 0.4

## 2020-11-10 NOTE — Assessment & Plan Note (Addendum)
-  Appears she has underlying vascular disease history, continues to smoke, and is now having abdominal pain associated with food intake and weight loss.  Concern is for chronic mesenteric ischemia.  Other considered differential would be infectious etiology -Continue fluids -CTA abdomen/pelvis shows advanced atherosclerotic calcification of the origin of the SMA with focal high-grade narrowing - GI now following peripherally; still needs outpatient work-up for her diarrhea (although no diarrhea or BM even since admission) -Seen by vascular surgery; plan is for angiography this week

## 2020-11-10 NOTE — ED Notes (Signed)
Pt assisted to the bedside toilet. Pt has BM and voided. Pt brief changed and pt given new pad.

## 2020-11-10 NOTE — Assessment & Plan Note (Signed)
-  Continue Lantus -Continue SSI and CBG monitoring -A1c 8.2%

## 2020-11-10 NOTE — Assessment & Plan Note (Signed)
-   continue asa/plavix - follows with vascular surgery outpatient; last seen by Dr. Delana Meyer on 03/15/20

## 2020-11-10 NOTE — ED Notes (Signed)
Pt husband at bedside

## 2020-11-10 NOTE — Assessment & Plan Note (Addendum)
-   baseline creatinine ~ 0.6 - patient presents with increase in creat >0.3 mg/dL above baseline presumed to have occurred within past 7 days PTA -Improved with fluids which are discontinued on 11/12/2020 -Follow BMP daily - IVF resumed on 3/22 for angio; d/c morning after procedure if renal function stable

## 2020-11-10 NOTE — Progress Notes (Signed)
PROGRESS NOTE    Heather [name]   YNW:[id number]  DOB: Mar 31, 1951  DOA: 11/09/2020     1  PCP: Heather Pac, FNP  CC: abdominal pain, N/V/D  Hospital Course: Ms. 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  She has had associated vomiting and diarrhea.  She also endorsed recent weight loss.  Her pain seems worsened with eating.  She was treated in February for diverticulitis with antibiotics. She is continuing to smoke.  Due to contrast allergy she underwent CT abdomen/pelvis without contrast which showed heavily calcified aorta and branch vessels. She was started on IV fluids and admitted for further work-up and monitoring.    Interval History:  Seen in the ER this morning husband bedside.  She endorsed ongoing tobacco use.  We again discussed smoking cessation.  Her abdominal pain seems to happen often times centered around eating.  There was also reported weight loss on admission as well.  ROS: Constitutional: negative for chills and fevers, Respiratory: negative for cough, Cardiovascular: negative for chest pain and Gastrointestinal: positive for abdominal pain  Assessment & Plan: * Abdominal pain -Appears she has underlying vascular disease history, continues to smoke, and is now having abdominal pain associated with food intake and weight loss.  Concern is for chronic mesenteric ischemia.  Other considered differential would be infectious etiology -Continue fluids -Premedicate with steroids and Benadryl, then will perform CTA abdomen/pelvis -Will consult GI for further recommendations at this time.  If CTA shows ischemia, will consult vascular surgery per GI recs  Syncope-resolved as of  11/10/2020 -Appears she has had poor oral intake with GI losses from vomiting and diarrhea.  Etiology likely volume depletion and orthostatic -Continue fluids -Hold home BP meds  Asymptomatic bacteriuria -Patient has no urinary symptoms at this time.  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 Delana Meyer on 03/15/20  Hypertension, benign -Holding home BP meds for now in setting of syncope on admission and volume depletion/orthostasis -We will resume home meds as needed  Diabetes mellitus ([place]) -Continue Lantus -Continue SSI and CBG monitoring -A1c 8.2%  CAD (coronary artery disease) -Continue aspirin, Lipitor  (HFpEF) heart failure with preserved ejection fraction (HCC) -Lasix on hold -Continue fluids -Follow volume status clinically    Old records reviewed in assessment of this patient  Antimicrobials: n/a  DVT prophylaxis: enoxaparin (LOVENOX) injection 40 mg Start: 11/10/20 2200   Code Status:   Code Status: Full Code Family Communication: Husband bedside  Disposition Plan: Status is: Inpatient  Remains inpatient appropriate because:Ongoing diagnostic testing needed not appropriate for outpatient work up, IV treatments appropriate due to intensity of illness or inability to take PO and Inpatient level of care appropriate due to severity of illness  Dispo: The patient is  from: Home              Anticipated d/c is to: Home              Patient currently is not medically stable to d/c.   Difficult to place patient No  Risk of unplanned readmission score: Unplanned Admission- Pilot do not use: 23.68   Objective: Blood pressure 118/73, pulse 73, temperature 98 F (36.7 C), temperature source Oral, resp. rate 17, height 4\' 11"  (1.499 m), weight 54.4 kg, SpO2 99 %.  Examination: General appearance: alert, cooperative and no distress Head: Normocephalic, without obvious abnormality, atraumatic Eyes: EOMI Lungs: clear to auscultation bilaterally Heart: regular rate and rhythm and S1, S2 normal Abdomen: soft, ND, BS present Extremities: no edema Skin: mobility and turgor normal Neurologic: Grossly normal  Consultants:   GI  Procedures:   n/a  Data Reviewed: I have personally reviewed following labs and imaging studies Results for orders placed or performed during the hospital encounter of 11/09/20 (from the past 24 hour(s))  Lipase, blood     Status: None   Collection Time: 11/09/20  4:51 PM  Result Value Ref Range   Lipase 26 11 - 51 U/L  Comprehensive metabolic panel     Status: Abnormal   Collection Time: 11/09/20  4:51 PM  Result Value Ref Range   Sodium 133 (L) 135 - 145 mmol/L   Potassium 2.8 (L) 3.5 - 5.1 mmol/L   Chloride 101 98 - 111 mmol/L   CO2 22 22 - 32 mmol/L   Glucose, Bld 179 (H) 70 - 99 mg/dL   BUN 25 (H) 8 - 23 mg/dL   Creatinine, Ser 1.45 (H) 0.44 - 1.00 mg/dL   Calcium 8.8 (L) 8.9 - 10.3 mg/dL   Total Protein 7.1 6.5 - 8.1 g/dL   Albumin 3.4 (L) 3.5 - 5.0 g/dL   AST 14 (L) 15 - 41 U/L   ALT 9 0 - 44 U/L   Alkaline Phosphatase 78 38 - 126 U/L   Total Bilirubin 0.5 0.3 - 1.2 mg/dL   GFR, Estimated 40 (L) >60 mL/min   Anion gap 10 5 - 15  CBC     Status: Abnormal   Collection Time: 11/09/20  4:51 PM  Result Value Ref Range   WBC 14.9 (H) 4.0 - 10.5 K/uL   RBC 4.25 3.87 - 5.11 MIL/uL   Hemoglobin 12.8 12.0 - 15.0 g/dL    HCT 38.5 36.0 - 46.0 %   MCV 90.6 80.0 - 100.0 fL   MCH 30.1 26.0 - 34.0 pg   MCHC 33.2 30.0 - 36.0 g/dL   RDW 14.5 11.5 - 15.5 %   Platelets 516 (H) 150 - 400 K/uL   nRBC 0.0 0.0 - 0.2 %  Urinalysis, Complete w Microscopic Urine, Clean Catch     Status: Abnormal   Collection Time: 11/09/20  4:52 PM  Result Value Ref Range   Color, Urine YELLOW (A) YELLOW   APPearance CLOUDY (A) CLEAR   Specific Gravity, Urine 1.017 1.005 - 1.030   pH 5.0 5.0 - 8.0   Glucose, UA NEGATIVE NEGATIVE mg/dL   Hgb urine dipstick NEGATIVE NEGATIVE   Bilirubin Urine NEGATIVE NEGATIVE   Ketones, ur NEGATIVE NEGATIVE mg/dL   Protein, ur NEGATIVE NEGATIVE mg/dL   Nitrite NEGATIVE NEGATIVE   Leukocytes,Ua MODERATE (A) NEGATIVE   RBC / HPF 6-10 0 - 5 RBC/hpf   WBC, UA 21-50 0 - 5 WBC/hpf  Bacteria, UA RARE (A) NONE SEEN   Squamous Epithelial / LPF 11-20 0 - 5   Mucus PRESENT    Hyaline Casts, UA PRESENT   Troponin I (High Sensitivity)     Status: None   Collection Time: 11/09/20  7:24 PM  Result Value Ref Range   Troponin I (High Sensitivity) 12 <18 ng/L  Lactic acid, plasma     Status: None   Collection Time: 11/09/20  7:49 PM  Result Value Ref Range   Lactic Acid, Venous 1.1 0.5 - 1.9 mmol/L  Hemoglobin A1c     Status: Abnormal   Collection Time: 11/10/20  4:35 AM  Result Value Ref Range   Hgb A1c MFr Bld 8.2 (H) 4.8 - 5.6 %   Mean Plasma Glucose 188.64 mg/dL  HIV Antibody (routine testing w rflx)     Status: None   Collection Time: 11/10/20  4:35 AM  Result Value Ref Range   HIV Screen 4th Generation wRfx Non Reactive Non Reactive  Basic metabolic panel     Status: Abnormal   Collection Time: 11/10/20  4:35 AM  Result Value Ref Range   Sodium 137 135 - 145 mmol/L   Potassium 3.1 (L) 3.5 - 5.1 mmol/L   Chloride 109 98 - 111 mmol/L   CO2 20 (L) 22 - 32 mmol/L   Glucose, Bld 142 (H) 70 - 99 mg/dL   BUN 21 8 - 23 mg/dL   Creatinine, Ser 1.13 (H) 0.44 - 1.00 mg/dL   Calcium 8.3 (L) 8.9 -  10.3 mg/dL   GFR, Estimated 53 (L) >60 mL/min   Anion gap 8 5 - 15  CBC     Status: Abnormal   Collection Time: 11/10/20  4:35 AM  Result Value Ref Range   WBC 16.3 (H) 4.0 - 10.5 K/uL   RBC 3.65 (L) 3.87 - 5.11 MIL/uL   Hemoglobin 10.9 (L) 12.0 - 15.0 g/dL   HCT 33.8 (L) 36.0 - 46.0 %   MCV 92.6 80.0 - 100.0 fL   MCH 29.9 26.0 - 34.0 pg   MCHC 32.2 30.0 - 36.0 g/dL   RDW 14.3 11.5 - 15.5 %   Platelets 484 (H) 150 - 400 K/uL   nRBC 0.0 0.0 - 0.2 %  Magnesium     Status: Abnormal   Collection Time: 11/10/20  4:35 AM  Result Value Ref Range   Magnesium 1.3 (L) 1.7 - 2.4 mg/dL  CBG monitoring, ED     Status: None   Collection Time: 11/10/20  8:12 AM  Result Value Ref Range   Glucose-Capillary 78 70 - 99 mg/dL  Glucose, capillary     Status: None   Collection Time: 11/10/20 11:40 AM  Result Value Ref Range   Glucose-Capillary 82 70 - 99 mg/dL    No results found for this or any previous visit (from the past 240 hour(s)).   Radiology Studies: CT ABDOMEN PELVIS WO CONTRAST  Result Date: 11/09/2020 CLINICAL DATA:  Fall, right side abdominal pain EXAM: CT ABDOMEN AND PELVIS WITHOUT CONTRAST TECHNIQUE: Multidetector CT imaging of the abdomen and pelvis was performed following the standard protocol without IV contrast. COMPARISON:  10/06/2020 FINDINGS: Lower chest: Coronary artery calcifications and aortic calcifications. No acute abnormality. Hepatobiliary: No focal liver abnormality is seen. Status post cholecystectomy. No biliary dilatation. Pancreas: No focal abnormality or ductal dilatation. Spleen: No focal abnormality.  Normal size. Adrenals/Urinary Tract: No adrenal abnormality. No focal renal abnormality. No stones or hydronephrosis. Urinary bladder is  unremarkable. Stomach/Bowel: Normal appendix. Stomach, large and small bowel grossly unremarkable. Vascular/Lymphatic: Heavily calcified aorta and iliac vessels. No evidence of aneurysm or adenopathy. Reproductive: Uterus and adnexa  unremarkable.  No mass. Other: No free fluid or free air. Musculoskeletal: No acute bony abnormality. IMPRESSION: No evidence of solid organ injury. Heavily calcified aorta and branch vessels. Sigmoid diverticulosis. Electronically Signed   By: Rolm Baptise M.D.   On: 11/09/2020 19:16   DG Chest 2 View  Result Date: 11/09/2020 CLINICAL DATA:  Chest pain after fall EXAM: CHEST - 2 VIEW COMPARISON:  March 09, 2012 FINDINGS: The heart size and mediastinal contours are within normal limits. Both lungs are clear. The visualized skeletal structures are unremarkable. IMPRESSION: No active cardiopulmonary disease. Electronically Signed   By: Dorise Bullion III M.D   On: 11/09/2020 19:24   DG Chest 2 View  Final Result    CT ABDOMEN PELVIS WO CONTRAST  Final Result    CT Angio Abd/Pel w/ and/or w/o    (Results Pending)    Scheduled Meds: . atorvastatin  80 mg Oral Daily  . clopidogrel  75 mg Oral Daily  . diphenhydrAMINE  50 mg Oral Once   Or  . diphenhydrAMINE  50 mg Intravenous Once  . donepezil  5 mg Oral QHS  . enoxaparin (LOVENOX) injection  40 mg Subcutaneous Q2200  . escitalopram  20 mg Oral Daily  . fluticasone  2 spray Each Nare Daily  . insulin aspart  0-9 Units Subcutaneous TID WC  . insulin glargine  20 Units Subcutaneous QHS  . mometasone-formoterol  2 puff Inhalation BID  . predniSONE  50 mg Oral Q6H  . pregabalin  50 mg Oral TID   PRN Meds: acetaminophen **OR** acetaminophen, ondansetron **OR** ondansetron (ZOFRAN) IV, oxyCODONE, traZODone Continuous Infusions: . sodium chloride 125 mL/hr at 11/10/20 1333     LOS: 1 day  Time spent: Greater than 50% of the 35 minute visit was spent in counseling/coordination of care for the patient as laid out in the A&P.   Dwyane Dee, MD [facility] 11/10/2020, 2:13 PM

## 2020-11-10 NOTE — Assessment & Plan Note (Signed)
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

## 2020-11-10 NOTE — ED Notes (Signed)
Dr. Sabino Gasser at bedside rounding on pt

## 2020-11-10 NOTE — Assessment & Plan Note (Signed)
-  Patient has no urinary symptoms at this time.  UA reviewed from admission -Agree for holding on further antibiotics at this time

## 2020-11-10 NOTE — Assessment & Plan Note (Signed)
-  Also considered due to GI losses -Replete and recheck as needed

## 2020-11-10 NOTE — Assessment & Plan Note (Signed)
-  Continue aspirin, Lipitor 

## 2020-11-10 NOTE — Consult Note (Signed)
Consultation  Referring Provider: Dr. Sabino Gasser     Admit date: 3/18 Consult date: 3/19         Reason for Consultation: Abdominal pain/diarrhea         HPI:   Heather [name] is a 70 y.o. with multiple vascular issues including CAD s/p stents, PVD, and history of TAVR who presented with severe abdominal pain and chronic diarrhea which has been on going for several months. Had episode of diverticulitis a couple of weeks ago that was treated but didn't improve her symptoms. She states that she gets severe abdominal pain 15-30 minutes after a meal that can last for hours after eating. This has caused her to "fear" eating and has resulted in weight loss. Has diarrhea daily that has been going on for the same amount of time although it appears that this problem has been on going for several years as she was referred to [facility] in 2019 for abdominal pain/diarrhea but doesn't look like she was seen. She denies any blood in her stool except a small amount occasionally. No family history of GI malignancies. Her last colonoscopy was 10 years ago (no records) but she states it was normal. She smokes 1.5 ppd. She denies any drugs or alcohol. She denies taking the aricept any more.  Past Medical History:  Diagnosis Date  . Anxiety   . Bipolar disorder ([place])   . Cancer ([place])    skin  . Depression   . Diabetes mellitus   . Diabetes mellitus, type II ([place])   . History of artificial heart valve   . Schizophrenia ([place])   . Vertigo     Past Surgical History:  Procedure Laterality Date  . ABDOMINAL HYSTERECTOMY    . BACK SURGERY    . CARDIAC VALVE REPLACEMENT    . CHOLECYSTECTOMY    . HEMORRHOID SURGERY    . TUBAL LIGATION      Family History  Problem Relation Age of Onset  . Mental illness Mother   . Breast cancer Neg Hx      Social History   Tobacco Use  . Smoking status: Current Every Day Smoker    Packs/day: 1.50    Types: Cigarettes  . Smokeless tobacco: Never Used  Vaping Use  . Vaping  Use: Never used  Substance Use Topics  . Alcohol use: Yes    Alcohol/week: 1.0 - 2.0 standard drink    Types: 1 - 2 Cans of beer per week  . Drug use: Not Currently    Prior to Admission medications   Medication Sig Start Date End Date Taking? Authorizing Provider  albuterol (VENTOLIN HFA) 108 (90 Base) MCG/ACT inhaler Inhale into the lungs. 03/09/14  Yes [provider]  aspirin EC 81 MG tablet Take 81 mg by mouth daily.   Yes [provider]  atorvastatin (LIPITOR) 80 MG tablet Take 80 mg by mouth daily. 08/28/18  Yes [provider]  cholecalciferol (VITAMIN D) 1000 units tablet Take 1,000 Units by mouth daily. Takes 2000 units daily.   Yes [provider]  clopidogrel (PLAVIX) 75 MG tablet Take 75 mg by mouth daily. 12/10/16  Yes [provider]  cyclobenzaprine (FLEXERIL) 10 MG tablet  09/07/18  Yes [provider]  escitalopram (LEXAPRO) 20 MG tablet Take 1 tablet (20 mg total) by mouth daily. 01/16/20  Yes Ursula Alert, MD  fluticasone (FLONASE) 50 MCG/ACT nasal spray  11/02/16  Yes [provider]  Fluticasone-Salmeterol (ADVAIR) 250-50 MCG/DOSE AEPB Inhale  1 puff into the lungs daily. 10/02/16  Yes [provider]  furosemide (LASIX) 20 MG tablet Take 20 mg by mouth daily. 08/28/18  Yes [provider]  insulin glargine (LANTUS) 100 UNIT/ML injection Inject 20 Units into the skin at bedtime.   Yes [provider]  insulin lispro (HUMALOG) 100 UNIT/ML KwikPen Inject 4-8 Units into the skin in the morning and at bedtime.   Yes [provider]  losartan (COZAAR) 50 MG tablet Take 50 mg by mouth daily. 09/07/20  Yes [provider]  metoprolol succinate (TOPROL-XL) 25 MG 24 hr tablet Take 25 mg by mouth daily. 10/02/18  Yes [provider]  Multiple Vitamins-Minerals (WOMENS MULTI VITAMIN & MINERAL PO) Take 1 tablet by mouth every morning.   Yes [provider]  pregabalin  (LYRICA) 50 MG capsule Take 50 mg by mouth 3 (three) times daily.   Yes [provider]  traZODone (DESYREL) 50 MG tablet TAKE 1/2 TO 1 TABLET AT BEDTIME AS NEEDED FOR SLEEP 10/15/20  Yes Ursula Alert, MD  ACCU-CHEK AVIVA PLUS test strip  11/19/18   [provider]  Accu-Chek Softclix Lancets lancets  11/19/18   [provider]  Alcohol Swabs (B-D SINGLE USE SWABS REGULAR) PADS  11/19/18   [provider]  Blood Glucose Calibration (ACCU-CHEK AVIVA) SOLN  11/20/18   [provider]  Blood Glucose Monitoring Suppl (ACCU-CHEK AVIVA PLUS) w/Device KIT  11/19/18   [provider]  donepezil (ARICEPT) 5 MG tablet Take 5 mg by mouth at bedtime. Patient not taking: Reported on 11/10/2020    [provider]  Insulin Aspart FlexPen 100 UNIT/ML SOPN  11/19/18   [provider]  Insulin Syringe-Needle U-100 30G X 5/16" 1 ML MISC  02/19/16   [provider]    Current Facility-Administered Medications  Medication Dose Route Frequency Provider Last Rate Last Admin  . 0.9 %  sodium chloride infusion   Intravenous Continuous [name], DO 125 mL/hr at 11/10/20 7017 Rate Verify at 11/10/20 0923  . acetaminophen (TYLENOL) tablet 650 mg  650 mg Oral Q6H PRN [name], DO       Or  . acetaminophen (TYLENOL) suppository 650 mg  650 mg Rectal Q6H PRN [name], DO      . atorvastatin (LIPITOR) tablet 80 mg  80 mg Oral Daily [name], DO   80 mg at 11/10/20 0920  . clopidogrel (PLAVIX) tablet 75 mg  75 mg Oral Daily [name], DO   75 mg at 11/10/20 7939  . diphenhydrAMINE (BENADRYL) capsule 50 mg  50 mg Oral Once Dwyane Dee, MD       Or  . diphenhydrAMINE (BENADRYL) injection 50 mg  50 mg Intravenous Once Dwyane Dee, MD      . donepezil (ARICEPT) tablet 5 mg  5 mg Oral QHS [name], DO   5 mg at 11/10/20 0113  . enoxaparin (LOVENOX)  injection 40 mg  40 mg Subcutaneous Q2200 Benita Gutter, RPH      . escitalopram (LEXAPRO) tablet 20 mg  20 mg Oral Daily [name], DO   20 mg at 11/10/20 0920  . fluticasone (FLONASE) 50 MCG/ACT nasal spray 2 spray  2 spray Each Nare Daily [name], DO   2 spray at 11/10/20 0300  . insulin aspart (novoLOG) injection 0-9 Units  0-9 Units Subcutaneous TID WC [name], DO      .  insulin glargine (LANTUS) injection 20 Units  20 Units Subcutaneous QHS [name], DO   20 Units at 11/10/20 0113  . mometasone-formoterol (DULERA) 200-5 MCG/ACT inhaler 2 puff  2 puff Inhalation BID [name], DO   2 puff at 11/10/20 0813  . ondansetron (ZOFRAN) tablet 4 mg  4 mg Oral Q6H PRN [name], DO       Or  . ondansetron [facility]) injection 4 mg  4 mg Intravenous Q6H PRN [name], DO      . oxyCODONE (Oxy IR/ROXICODONE) immediate release tablet 5 mg  5 mg Oral Q4H PRN [name], DO   5 mg at 11/10/20 0850  . predniSONE (DELTASONE) tablet 50 mg  50 mg Oral Q6H Dwyane Dee, MD   50 mg at 11/10/20 0927  . pregabalin (LYRICA) capsule 50 mg  50 mg Oral TID [name], DO   50 mg at 11/10/20 0920  . traZODone (DESYREL) tablet 50 mg  50 mg Oral QHS PRN [name], DO        Allergies as of 11/09/2020 - Review Complete 11/09/2020  Allergen Reaction Noted  . Iodinated diagnostic agents Hives 05/30/2014  . Sulfa antibiotics  01/30/2012  . Shellfish allergy Rash and Other (See Comments) 12/01/2012     Review of Systems:    All systems reviewed and negative except where noted in HPI.  Review of Systems  Constitutional: Negative for chills and fever.  Respiratory: Negative for cough.   Cardiovascular: Negative for chest pain.  Gastrointestinal: Positive for abdominal pain, diarrhea and nausea.  Musculoskeletal: Positive for joint pain.  Skin: Negative  for itching and rash.  Neurological: Positive for focal weakness.  Psychiatric/Behavioral: Negative for substance abuse.  All other systems reviewed and are negative.    Physical Exam:  Vital signs in last 24 hours: Temp:  [98 F (36.7 C)-98.2 F (36.8 C)] 98.2 F (36.8 C) (03/19 1139) Pulse Rate:  [72-92] 73 (03/19 1038) Resp:  [16-17] 17 (03/19 1038) BP: (96-136)/(50-80) 96/50 (03/19 1139) SpO2:  [87 %-100 %] 99 % (03/19 1139) Weight:  [54.4 kg] 54.4 kg (03/18 1649) Last BM Date: 11/10/20 General:   Pleasant in NAD Head:  Normocephalic and atraumatic. Eyes:   No icterus.   Conjunctiva pink. Mouth: Mucosa pink moist, no lesions. Neck:  Supple; no masses felt Lungs:  No respiratory distress Abdomen:   Flat, soft, mildly tender Msk:  No clubbing or cyanosis Neurologic:  Alert and  oriented x4;  Cranial nerves II-XII intact.  Skin:  Warm, dry, pink without significant lesions or rashes. Psych:  Alert and cooperative. Normal affect.  LAB RESULTS: Recent Labs    11/09/20 1651 11/10/20 0435  WBC 14.9* 16.3*  HGB 12.8 10.9*  HCT 38.5 33.8*  PLT 516* 484*   BMET Recent Labs    11/09/20 1651 11/10/20 0435  NA 133* 137  K 2.8* 3.1*  CL 101 109  CO2 22 20*  GLUCOSE 179* 142*  BUN 25* 21  CREATININE 1.45* 1.13*  CALCIUM 8.8* 8.3*   LFT Recent Labs    11/09/20 1651  PROT 7.1  ALBUMIN 3.4*  AST 14*  ALT 9  ALKPHOS 78  BILITOT 0.5   PT/INR No results for input(s): LABPROT, INR in the last 72 hours.  STUDIES: CT ABDOMEN PELVIS WO CONTRAST  Result Date: 11/09/2020 CLINICAL DATA:  Fall, right side abdominal pain EXAM: CT ABDOMEN AND PELVIS WITHOUT CONTRAST TECHNIQUE: Multidetector CT imaging of  the abdomen and pelvis was performed following the standard protocol without IV contrast. COMPARISON:  10/06/2020 FINDINGS: Lower chest: Coronary artery calcifications and aortic calcifications. No acute abnormality. Hepatobiliary: No focal liver abnormality is seen.  Status post cholecystectomy. No biliary dilatation. Pancreas: No focal abnormality or ductal dilatation. Spleen: No focal abnormality.  Normal size. Adrenals/Urinary Tract: No adrenal abnormality. No focal renal abnormality. No stones or hydronephrosis. Urinary bladder is unremarkable. Stomach/Bowel: Normal appendix. Stomach, large and small bowel grossly unremarkable. Vascular/Lymphatic: Heavily calcified aorta and iliac vessels. No evidence of aneurysm or adenopathy. Reproductive: Uterus and adnexa unremarkable.  No mass. Other: No free fluid or free air. Musculoskeletal: No acute bony abnormality. IMPRESSION: No evidence of solid organ injury. Heavily calcified aorta and branch vessels. Sigmoid diverticulosis. Electronically Signed   By: Rolm Baptise M.D.   On: 11/09/2020 19:16   DG Chest 2 View  Result Date: 11/09/2020 CLINICAL DATA:  Chest pain after fall EXAM: CHEST - 2 VIEW COMPARISON:  March 09, 2012 FINDINGS: The heart size and mediastinal contours are within normal limits. Both lungs are clear. The visualized skeletal structures are unremarkable. IMPRESSION: No active cardiopulmonary disease. Electronically Signed   By: Dorise Bullion III M.D   On: 11/09/2020 19:24       Impression / Plan:   70 y/o lady who is a heavy smoker with several vascular issues here with chronic abdominal pain and sitophobia. Chronic mesenteric ischemia needs to be evaluated before considering other etiologies of chronic abdominal pain/diarrhea  - CTA with pre-medications for contrast allergy although patients appears to have tolerated contrast in the past ([facility] everywhere imaging) - consider IV hydration prior to CTA given heavy contrast load - further recs after imaging performed, if chronic mesenteric ischemia suspected/confirmed on imaging then will need vascular surgery consult - in terms of colonoscopy for diverticulitis, will need to be considered as an outpatient basis as typically wait about 6 weeks after  episode before performing one - diet per hospitalist team  Will continue to follow, please call with any questions or concerns  Raylene Miyamoto MD, MPH [place]

## 2020-11-10 NOTE — Assessment & Plan Note (Addendum)
-  Appears she has had poor oral intake with GI losses from vomiting and diarrhea.  Etiology likely volume depletion and orthostatic -Hold home BP meds - s/p IVF; resumed for angio, d/c soon if renal function stable

## 2020-11-10 NOTE — ED Notes (Signed)
Pt asking about breakfast, pt informed that there are currently orders for her to be NPO so she will not be getting a breakfast tray. Pt verbalized understanding.

## 2020-11-10 NOTE — Progress Notes (Signed)
PHARMACIST - PHYSICIAN COMMUNICATION  CONCERNING:  Enoxaparin (Lovenox) for DVT Prophylaxis    RECOMMENDATION: Patient was prescribed enoxaparin 30mg  q24 hours for VTE prophylaxis.   Filed Weights   11/09/20 1649  Weight: 54.4 kg (120 lb)    Body mass index is 24.24 kg/m.  Estimated Creatinine Clearance: 36.4 mL/min (A) (by C-G formula based on SCr of 1.13 mg/dL (H)).   Patient is candidate for enoxaparin 40mg  every 24 hours based on CrCl >58ml/min and Weight >45kg  DESCRIPTION: Pharmacy has adjusted enoxaparin dose per [facility] policy.  Patient is now receiving enoxaparin 40 mg every 24 hours   Benita Gutter 11/10/2020 8:03 AM

## 2020-11-10 NOTE — Assessment & Plan Note (Addendum)
-  BP has stabilized.  Resume losartan and Toprol

## 2020-11-10 NOTE — Progress Notes (Addendum)
Upon admission pt stated that she has diarrhea and had about six liquid stools for the past 24hrs.  A secured text message sent to D. Juleen China to check if need to test for C.diff/GI panel/enteric precautions.  Awaiting response.    Notified Dr. Sabino Gasser of the above.  MD placed orders to collect C.diff/GI panel and enteric precautions.

## 2020-11-10 NOTE — ED Notes (Signed)
Pt had brief episode of desatting while sleeping, however, SpO2 returned to normal.

## 2020-11-10 NOTE — Plan of Care (Signed)
Patient admitted from the ED today.  VSS.  Enteric precautions initiated to r/o C.diff.  Still need a specimen.  Oxycodone given once for abdominal pain with improvement.  No c/o n/v.  Tolerates clear liquid diet.

## 2020-11-10 NOTE — ED Notes (Signed)
Pt requesting pain medication. Pt vital signs updated. Pt has order for advanced diet as tolerate, can have clear liquids, will give pt drink and pain medication.

## 2020-11-10 NOTE — Assessment & Plan Note (Addendum)
-  Lasix on hold - eating well, can d/c IVF -Follow volume status clinically

## 2020-11-10 NOTE — Hospital Course (Addendum)
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  She has had associated vomiting and diarrhea.  She also endorsed recent weight loss.  Her pain seems worsened with eating.  She was treated in February for diverticulitis with antibiotics. She is continuing to smoke.  Due to contrast allergy she underwent CT abdomen/pelvis without contrast which showed heavily calcified aorta and branch vessels. She was started on IV fluids and admitted for further work-up and monitoring.  GI was initially consulted on admission.  Given her history of atherosclerosis and symptoms that appeared concerning for mesenteric ischemia, she was recommended for a CTA abdomen/pelvis.  She underwent premedications due to history of iodine allergy and underwent testing. She was found to have advanced atherosclerotic calcification of the aorta and mesenteric vessels.  Origin of the SMA showed focal high-grade narrowing with advanced calcification.  Vascular surgery consult was recommended per GI.  She was evaluated by vascular surgery and was recommended for angiography on 11/13/20.

## 2020-11-11 DIAGNOSIS — K551 Chronic vascular disorders of intestine: Secondary | ICD-10-CM | POA: Diagnosis not present

## 2020-11-11 DIAGNOSIS — R109 Unspecified abdominal pain: Secondary | ICD-10-CM | POA: Diagnosis not present

## 2020-11-11 LAB — CBC WITH DIFFERENTIAL/PLATELET
Abs Immature Granulocytes: 0.13 10*3/uL — ABNORMAL HIGH (ref 0.00–0.07)
Basophils Absolute: 0 10*3/uL (ref 0.0–0.1)
Basophils Relative: 0 %
Eosinophils Absolute: 0 10*3/uL (ref 0.0–0.5)
Eosinophils Relative: 0 %
HCT: 33.1 % — ABNORMAL LOW (ref 36.0–46.0)
Hemoglobin: 10.9 g/dL — ABNORMAL LOW (ref 12.0–15.0)
Immature Granulocytes: 1 %
Lymphocytes Relative: 5 %
Lymphs Abs: 0.6 10*3/uL — ABNORMAL LOW (ref 0.7–4.0)
MCH: 30.8 pg (ref 26.0–34.0)
MCHC: 32.9 g/dL (ref 30.0–36.0)
MCV: 93.5 fL (ref 80.0–100.0)
Monocytes Absolute: 0.1 10*3/uL (ref 0.1–1.0)
Monocytes Relative: 1 %
Neutro Abs: 10.4 10*3/uL — ABNORMAL HIGH (ref 1.7–7.7)
Neutrophils Relative %: 93 %
Platelets: 445 10*3/uL — ABNORMAL HIGH (ref 150–400)
RBC: 3.54 MIL/uL — ABNORMAL LOW (ref 3.87–5.11)
RDW: 14.5 % (ref 11.5–15.5)
WBC: 11.2 10*3/uL — ABNORMAL HIGH (ref 4.0–10.5)
nRBC: 0 % (ref 0.0–0.2)

## 2020-11-11 LAB — BASIC METABOLIC PANEL
Anion gap: 6 (ref 5–15)
BUN: 14 mg/dL (ref 8–23)
CO2: 18 mmol/L — ABNORMAL LOW (ref 22–32)
Calcium: 8 mg/dL — ABNORMAL LOW (ref 8.9–10.3)
Chloride: 113 mmol/L — ABNORMAL HIGH (ref 98–111)
Creatinine, Ser: 0.91 mg/dL (ref 0.44–1.00)
GFR, Estimated: >60 mL/min (ref >60)
Glucose, Bld: 309 mg/dL — ABNORMAL HIGH (ref 70–99)
Potassium: 4.8 mmol/L (ref 3.5–5.1)
Sodium: 137 mmol/L (ref 135–145)

## 2020-11-11 LAB — MAGNESIUM: Magnesium: 1.2 mg/dL — ABNORMAL LOW (ref 1.7–2.4)

## 2020-11-11 LAB — URINE CULTURE

## 2020-11-11 LAB — GLUCOSE, CAPILLARY
Glucose-Capillary: 298 mg/dL — ABNORMAL HIGH (ref 70–99)
Glucose-Capillary: 316 mg/dL — ABNORMAL HIGH (ref 70–99)
Glucose-Capillary: 236 mg/dL — ABNORMAL HIGH (ref 70–99)
Glucose-Capillary: 141 mg/dL — ABNORMAL HIGH (ref 70–99)

## 2020-11-11 MED ORDER — MAGNESIUM SULFATE 4 GM/100ML IV SOLN
4.0000 g | Freq: Once | INTRAVENOUS | Status: AC
  Administered 2020-11-11: 4 g via INTRAVENOUS
  Filled 2020-11-11: qty 100

## 2020-11-11 MED ORDER — METOPROLOL SUCCINATE ER 25 MG PO TB24
25.0000 mg | ORAL_TABLET | Freq: Every day | ORAL | Status: DC
  Administered 2020-11-11 – 2020-11-15 (×4): 25 mg via ORAL
  Filled 2020-11-11 (×4): qty 1

## 2020-11-11 MED ORDER — LOSARTAN POTASSIUM 50 MG PO TABS
50.0000 mg | ORAL_TABLET | Freq: Every day | ORAL | Status: DC
  Administered 2020-11-11 – 2020-11-15 (×4): 50 mg via ORAL
  Filled 2020-11-11 (×4): qty 1

## 2020-11-11 NOTE — Consult Note (Signed)
[facility]  Patient name: Heather [name] MRN: [id number] DOB: 1951-03-10 Sex: female   REQUESTING PROVIDER:    Hospitalists   REASON FOR CONSULT:    Possible mesenteric ischemia  HISTORY OF PRESENT ILLNESS:   Heather [name] is a 70 y.o. female, who presented to the emergency department on 11/10/2020 with abdominal pain and syncope.  She reports approximately an 61-month history of daily abdominal pain which is associated with recurrent diarrhea.  She states that she will have bubbly diarrhea 5-20 times a day.  Her abdominal pain is exacerbated by eating.  She reports approximately a 15 pound weight loss.  She will occasionally see blood in her stool but thinks this is from her skin being raw.  She was treated for diverticulitis with antibiotics in February without any significant change in her symptoms.  She recently underwent a CT angiogram which suggested mesenteric stenosis, therefore we are consulted.  Her last colonoscopy was 10 years ago which was normal.  Patient has a history of bipolar disorder and schizophrenia.  She is medically managed for hypertension.  She has previously been seen in our office for 1 block claudication which was treated medically.  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  She has also undergone TAVR.  She is a daily smoker.  PAST MEDICAL HISTORY    Past Medical History:  Diagnosis Date   Anxiety    Bipolar disorder ([place])    Cancer ([place])    skin   Depression    Diabetes mellitus    Diabetes mellitus, type II ([place])    History of artificial heart valve    Schizophrenia ([place])    Vertigo      FAMILY HISTORY   Family History  Problem Relation Age of Onset   Mental illness Mother    Breast cancer Neg Hx     SOCIAL HISTORY:   Social History   Socioeconomic History   Marital status: Married    Spouse name:  rowland   Number of children: 2   Years of education: Not on file   Highest education level: High school graduate  Occupational History   Not on file  Tobacco Use   Smoking status: Current Every Day Smoker    Packs/day: 1.50    Types: Cigarettes   Smokeless tobacco: Never Used  Vaping Use   Vaping Use: Never used  Substance and Sexual Activity   Alcohol use: Yes    Alcohol/week: 1.0 - 2.0 standard drink    Types: 1 - 2 Cans of beer per week   Drug use: Not Currently   Sexual activity: Not Currently  Other Topics Concern   Not on file  Social History Narrative   Not on file   Social Determinants of Health   Financial Resource Strain: Not on file  Food Insecurity: Not on file  Transportation Needs: Not on file  Physical Activity: Not on file  Stress: Not on file  Social Connections: Not on file  Intimate Partner Violence: Not on file    ALLERGIES:    Allergies  Allergen Reactions   Iodinated Diagnostic Agents Hives   Sulfa Antibiotics    Shellfish Allergy Rash and Other (See Comments)    Scallops specifically cause VOMITING Scallops specifically cause VOMITING     CURRENT MEDICATIONS:    Current Facility-Administered Medications  Medication Dose Route Frequency Provider Last Rate Last Admin   0.9 %  sodium chloride infusion   Intravenous Continuous Dwyane Dee, MD 75 mL/hr at 11/11/20 1302 Rate Change at 11/11/20 1302   acetaminophen (TYLENOL) tablet 650 mg  650 mg Oral Q6H PRN [name], DO   650 mg at 11/11/20 0244   Or   acetaminophen (TYLENOL) suppository 650 mg  650 mg Rectal Q6H PRN [name], DO       atorvastatin (LIPITOR) tablet 80 mg  80 mg Oral Daily [name], DO   80 mg at 11/11/20 0827   clopidogrel (PLAVIX) tablet 75 mg  75 mg Oral Daily [name], DO   75 mg at 11/11/20 2119   donepezil (ARICEPT) tablet 5 mg  5 mg Oral QHS [name], DO   5  mg at 11/10/20 2126   enoxaparin (LOVENOX) injection 40 mg  40 mg Subcutaneous Q2200 Benita Gutter, RPH   40 mg at 11/10/20 2125   escitalopram (LEXAPRO) tablet 20 mg  20 mg Oral Daily [name], DO   20 mg at 11/11/20 4174   fluticasone (FLONASE) 50 MCG/ACT nasal spray 2 spray  2 spray Each Nare Daily [name], DO   2 spray at 11/11/20 0830   insulin aspart (novoLOG) injection 0-9 Units  0-9 Units Subcutaneous TID WC [name], DO   7 Units at 11/11/20 1301   insulin glargine (LANTUS) injection 20 Units  20 Units Subcutaneous QHS [name], DO   20 Units at 11/10/20 2126   losartan (COZAAR) tablet 50 mg  50 mg Oral Daily Dwyane Dee, MD   50 mg at 11/11/20 1301   metoprolol succinate (TOPROL-XL) 24 hr tablet 25 mg  25 mg Oral Daily Dwyane Dee, MD   25 mg at 11/11/20 1302   mometasone-formoterol (DULERA) 200-5 MCG/ACT inhaler 2 puff  2 puff Inhalation BID [name], DO   2 puff at 11/11/20 0743   ondansetron (ZOFRAN) tablet 4 mg  4 mg Oral Q6H PRN [name], DO       Or   ondansetron [facility]) injection 4 mg  4 mg Intravenous Q6H PRN [name], DO       oxyCODONE (Oxy IR/ROXICODONE) immediate release tablet 5 mg  5 mg Oral Q4H PRN [name], DO   5 mg at 11/11/20 0814   pregabalin (LYRICA) capsule 50 mg  50 mg Oral TID [name], DO   50 mg at 11/11/20 4818   traZODone (DESYREL) tablet 50 mg  50 mg Oral QHS PRN [name], DO        REVIEW OF SYSTEMS:   [X]  denotes positive finding, [ ]  denotes negative finding Cardiac  Comments:  Chest pain or chest pressure:    Shortness of breath upon exertion:    Short of breath when lying flat:    Irregular heart rhythm:        Vascular    Pain in calf, thigh, or hip brought on by ambulation: x   Pain in feet at night that wakes you up from your sleep:     Blood clot in  your veins:    Leg swelling:         Pulmonary    Oxygen at home:    Productive cough:     Wheezing:         Neurologic    Sudden weakness in arms or legs:     Sudden numbness in arms or  legs:     Sudden onset of difficulty speaking or slurred speech:    Temporary loss of vision in one eye:     Problems with dizziness:         Gastrointestinal    Blood in stool:  x    Vomited blood:         Genitourinary    Burning when urinating:     Blood in urine:        Psychiatric    Major depression:         Hematologic    Bleeding problems:    Problems with blood clotting too easily:        Skin    Rashes or ulcers:        Constitutional    Fever or chills:     PHYSICAL EXAM:   Vitals:   11/11/20 0058 11/11/20 0516 11/11/20 0748 11/11/20 1200  BP: (!) 152/65 (!) 160/67 (!) 143/87 (!) 158/74  Pulse: 73 72 61 68  Resp: 16 16 17 16   Temp: 97.9 F (36.6 C) 98.2 F (36.8 C) 98.7 F (37.1 C) 98.3 F (36.8 C)  TempSrc: Oral Oral Oral Oral  SpO2: 93% 94% 97% 98%  Weight:      Height:        GENERAL: The patient is a well-nourished female, in no acute distress. The vital signs are documented above. CARDIAC: There is a regular rate and rhythm.  VASCULAR: Palpable femoral pulses PULMONARY: Nonlabored respirations ABDOMEN: Soft and non-tender with normal pitched bowel sounds.  MUSCULOSKELETAL: There are no major deformities or cyanosis. NEUROLOGIC: No focal weakness or paresthesias are detected. SKIN: There are no ulcers or rashes noted. PSYCHIATRIC: The patient has a normal affect.  STUDIES:   I have reviewed her CT angiogram of the following findings: 1. No acute intra-abdominal or pelvic pathology. No CT evidence of mesenteric ischemia. 2. Advanced atherosclerotic calcification of the aorta and mesenteric vessels as described. 3. Colonic diverticulosis. No bowel obstruction. Normal appendix.   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  This certainly could explain her postprandial abdominal pain, and weight loss, however does not explain her frequent issues with diarrhea.  We will plan for angiography to further evaluate her mesenteric stenosis, and possibly intervene, however she should continue with ongoing GI work-up for her diarrhea.  She does report a rash with contrast, so she will need to be premedicated.  I anticipate angiography earlier this week.   Leia Alf, MD, FACS [facility] [phone] Pager [phone]

## 2020-11-11 NOTE — Progress Notes (Signed)
GI Inpatient Follow-up Note  Subjective:  Patient seen eating breakfast. She knows abdominal pain will eventually come. CTA reviewed.  Scheduled Inpatient Medications:  . atorvastatin  80 mg Oral Daily  . clopidogrel  75 mg Oral Daily  . donepezil  5 mg Oral QHS  . enoxaparin (LOVENOX) injection  40 mg Subcutaneous Q2200  . escitalopram  20 mg Oral Daily  . fluticasone  2 spray Each Nare Daily  . insulin aspart  0-9 Units Subcutaneous TID WC  . insulin glargine  20 Units Subcutaneous QHS  . mometasone-formoterol  2 puff Inhalation BID  . pregabalin  50 mg Oral TID    Continuous Inpatient Infusions:   . sodium chloride 100 mL/hr at 11/11/20 0742  . magnesium sulfate bolus IVPB 4 g (11/11/20 0835)    PRN Inpatient Medications:  acetaminophen **OR** acetaminophen, ondansetron **OR** ondansetron (ZOFRAN) IV, oxyCODONE, traZODone  Review of Systems:  Review of Systems  Constitutional: Negative for chills and fever.  Respiratory: Negative for cough.   Cardiovascular: Negative for chest pain.  Gastrointestinal: Positive for abdominal pain. Negative for blood in stool, constipation, diarrhea and melena.  Genitourinary: Negative for dysuria.  Musculoskeletal: Positive for joint pain.  Skin: Negative for itching and rash.  Neurological: Negative for focal weakness.  All other systems reviewed and are negative.    Physical Examination: BP (!) 143/87 (BP Location: Right Arm)   Pulse 61   Temp 98.7 F (37.1 C) (Oral)   Resp 17   Ht 4\' 11"  (1.499 m)   Wt 54.4 kg   SpO2 97%   BMI 24.24 kg/m  Gen: NAD, alert and oriented x 4 HEENT: PEERLA, EOMI, Neck: supple, no JVD or thyromegaly Chest: No respiratory distress Abd: soft, non-distended Ext: no edema, Skin: no rash or lesions noted Lymph: no LAD  Data: Lab Results  Component Value Date   WBC 11.2 (H) 11/11/2020   HGB 10.9 (L) 11/11/2020   HCT 33.1 (L) 11/11/2020   MCV 93.5 11/11/2020   PLT 445 (H) 11/11/2020    Recent Labs  Lab 11/09/20 1651 11/10/20 0435 11/11/20 0430  HGB 12.8 10.9* 10.9*   Lab Results  Component Value Date   NA 137 11/11/2020   K 4.8 11/11/2020   CL 113 (H) 11/11/2020   CO2 18 (L) 11/11/2020   BUN 14 11/11/2020   CREATININE 0.91 11/11/2020   Lab Results  Component Value Date   ALT 9 11/09/2020   AST 14 (L) 11/09/2020   ALKPHOS 78 11/09/2020   BILITOT 0.5 11/09/2020   No results for input(s): APTT, INR, PTT in the last 168 hours. Assessment/Plan:  70 y/o lady who is a heavy smoker with several vascular issues here with chronic abdominal pain and sitophobia. CTA with severe stenosis of SMA and atherosclerotic disease of other mesenteric vessels.   Recommendations:  - given high pre-test of probability of chronic mesenteric ischemia, would recommend vascular surgery consult  Will follow peripherally, please call with any questions or concerns.  Raylene Miyamoto MD, MPH [place]

## 2020-11-11 NOTE — Progress Notes (Signed)
Patient had no BM today.  Per Dr. Sabino Gasser to discontinue enteric precautions and C.diff/GI panel collection.

## 2020-11-11 NOTE — Progress Notes (Signed)
PROGRESS NOTE    Heather [name]   TDD:[id number]  DOB: 10-03-1950  DOA: 11/09/2020     2  PCP: Heather Pac, FNP  CC: abdominal pain, N/V/D  Hospital Course: 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  She has had associated vomiting and diarrhea.  She also endorsed recent weight loss.  Her pain seems worsened with eating.  She was treated in February for diverticulitis with antibiotics. She is continuing to smoke.  Due to contrast allergy she underwent CT abdomen/pelvis without contrast which showed heavily calcified aorta and branch vessels. She was started on IV fluids and admitted for further work-up and monitoring.  GI was initially consulted on admission.  Given her history of atherosclerosis and symptoms that appeared concerning for mesenteric ischemia, she was recommended for a CTA abdomen/pelvis.  She underwent premedications due to history of iodine allergy and underwent testing. She was found to have advanced atherosclerotic calcification of the aorta and mesenteric vessels.  Origin of the SMA showed focal high-grade narrowing with advanced calcification.  Vascular surgery consult was recommended per GI.    Interval History:  No events overnight.  Still denying any significant bowel movement since admission but was reporting diarrhea prior to arrival.  She was able to eat breakfast this morning and states that she can feel some abdominal pain starting.  ROS: Constitutional: negative for chills and fevers, Respiratory: negative for cough, Cardiovascular: negative for chest pain and Gastrointestinal: positive for abdominal pain  Assessment & Plan: * Abdominal pain -Appears she has underlying  vascular disease history, continues to smoke, and is now having abdominal pain associated with food intake and weight loss.  Concern is for chronic mesenteric ischemia.  Other considered differential would be infectious etiology -Continue fluids -CTA abdomen/pelvis shows advanced atherosclerotic calcification of the origin of the SMA with focal high-grade narrowing -Vascular surgery consulted for further input regarding CTA findings - GI now following peripherally  Syncope-resolved as of 11/10/2020 -Appears she has had poor oral intake with GI losses from vomiting and diarrhea.  Etiology likely volume depletion and orthostatic -Continue fluids -Hold home BP meds  Asymptomatic bacteriuria -Patient has no urinary symptoms at this time.  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 Delana Meyer on 03/15/20  Hypertension, benign -BP has stabilized.  Resume losartan and Toprol  Diabetes mellitus (HCC) -Continue Lantus -Continue SSI and CBG monitoring -A1c 8.2%  CAD (coronary artery disease) -Continue aspirin, Lipitor  (HFpEF) heart failure with preserved ejection fraction (HCC) -Lasix on hold -Continue fluids -Follow volume status clinically   Old records reviewed in assessment of this patient  Antimicrobials: n/a  DVT prophylaxis: enoxaparin (LOVENOX) injection 40 mg Start: 11/10/20  2200   Code Status:  Code Status: Full Code Family Communication: Husband  Disposition Plan: Status is: Inpatient  Remains inpatient appropriate because:Ongoing diagnostic testing needed not appropriate for outpatient work up, IV treatments appropriate due to intensity of illness or inability to take PO and Inpatient level of care appropriate due to severity of illness   Dispo: The patient is from: Home              Anticipated d/c is to: Home              Patient currently is not medically stable to d/c.   Difficult to place patient No  Risk of unplanned readmission score: Unplanned Admission- Pilot do not use: 20.63   Objective: Blood pressure (!) 143/87, pulse 61, temperature 98.7 F (37.1 C), temperature source Oral, resp. rate 17, height 4\' 11"  (1.499 m), weight 54.4 kg, SpO2 97 %.  Examination: General appearance: alert, cooperative and no distress Head: Normocephalic, without obvious abnormality, atraumatic Eyes: EOMI Lungs: clear to auscultation bilaterally Heart: regular rate and rhythm and S1, S2 normal Abdomen: Soft, obese, nondistended.  Mild nonspecific tenderness, no rebound/guarding Extremities: no edema Skin: mobility and turgor normal Neurologic: Grossly normal  Consultants:   GI  Vascular surgery  Procedures:   n/a  Data Reviewed: I have personally reviewed following labs and imaging studies Results for orders placed or performed during the hospital encounter of 11/09/20 (from the past 24 hour(s))  Glucose, capillary     Status: Abnormal   Collection Time: 11/10/20  4:26 PM  Result Value Ref Range   Glucose-Capillary 286 (H) 70 - 99 mg/dL  Glucose, capillary     Status: Abnormal   Collection Time: 11/10/20  8:38 PM  Result Value Ref Range   Glucose-Capillary 303 (H) 70 - 99 mg/dL  Basic metabolic panel     Status: Abnormal   Collection Time: 11/11/20  4:30 AM  Result Value Ref Range   Sodium 137 135 - 145 mmol/L   Potassium 4.8 3.5 - 5.1  mmol/L   Chloride 113 (H) 98 - 111 mmol/L   CO2 18 (L) 22 - 32 mmol/L   Glucose, Bld 309 (H) 70 - 99 mg/dL   BUN 14 8 - 23 mg/dL   Creatinine, Ser 0.91 0.44 - 1.00 mg/dL   Calcium 8.0 (L) 8.9 - 10.3 mg/dL   GFR, Estimated >60 >60 mL/min   Anion gap 6 5 - 15  CBC with Differential/Platelet     Status: Abnormal   Collection Time: 11/11/20  4:30 AM  Result Value Ref Range   WBC 11.2 (H) 4.0 - 10.5 K/uL   RBC 3.54 (L) 3.87 - 5.11 MIL/uL   Hemoglobin 10.9 (L) 12.0 - 15.0 g/dL   HCT 33.1 (L) 36.0 - 46.0 %   MCV 93.5 80.0 - 100.0 fL   MCH 30.8 26.0 - 34.0 pg   MCHC 32.9 30.0 - 36.0 g/dL   RDW 14.5 11.5 - 15.5 %   Platelets 445 (H) 150 - 400 K/uL   nRBC 0.0 0.0 - 0.2 %   Neutrophils Relative % 93 %   Neutro Abs 10.4 (H) 1.7 - 7.7 K/uL   Lymphocytes Relative 5 %   Lymphs Abs 0.6 (L) 0.7 - 4.0 K/uL   Monocytes Relative 1 %   Monocytes Absolute 0.1 0.1 - 1.0 K/uL   Eosinophils Relative 0 %   Eosinophils Absolute 0.0 0.0 - 0.5 K/uL   Basophils Relative 0 %   Basophils Absolute 0.0 0.0 - 0.1 K/uL  Immature Granulocytes 1 %   Abs Immature Granulocytes 0.13 (H) 0.00 - 0.07 K/uL  Magnesium     Status: Abnormal   Collection Time: 11/11/20  4:30 AM  Result Value Ref Range   Magnesium 1.2 (L) 1.7 - 2.4 mg/dL  Glucose, capillary     Status: Abnormal   Collection Time: 11/11/20  8:12 AM  Result Value Ref Range   Glucose-Capillary 298 (H) 70 - 99 mg/dL  Glucose, capillary     Status: Abnormal   Collection Time: 11/11/20 11:54 AM  Result Value Ref Range   Glucose-Capillary 316 (H) 70 - 99 mg/dL    Recent Results (from the past 240 hour(s))  Urine culture     Status: Abnormal   Collection Time: 11/09/20  4:52 PM   Specimen: Urine, Random  Result Value Ref Range Status   Specimen Description   Final    URINE, RANDOM Performed at [facility], [address], [place], [place] [address]    Special Requests   Final    NONE Performed at [facility], [address] [place], [place] [address]    Culture MULTIPLE SPECIES PRESENT, SUGGEST RECOLLECTION (A)  Final   Report Status 11/11/2020 FINAL  Final  SARS CORONAVIRUS 2 (TAT 6-24 HRS) Nasopharyngeal Nasopharyngeal Swab     Status: None   Collection Time: 11/10/20  3:07 AM   Specimen: Nasopharyngeal Swab  Result Value Ref Range Status   SARS Coronavirus 2 NEGATIVE NEGATIVE Final    Comment: (NOTE) SARS-CoV-2 target nucleic acids are NOT DETECTED.  The SARS-CoV-2 RNA is generally detectable in upper and lower respiratory specimens during the acute phase of infection. Negative results do not preclude SARS-CoV-2 infection, do not rule out co-infections with other pathogens, and should not be used as the sole basis for treatment or other patient management decisions. Negative results must be combined with clinical observations, patient history, and epidemiological information. The expected result is Negative.  Fact Sheet for Patients: SugarRoll.be  Fact Sheet for Healthcare Providers: https://www.woods-mathews.com/  This test is not yet approved or cleared by the [place] FDA and  has been authorized for detection and/or diagnosis of SARS-CoV-2 by FDA under an Emergency Use Authorization (EUA). This EUA will remain  in effect (meaning this test can be used) for the duration of the COVID-19 declaration under Se ction 564(b)(1) of the Act, 21 U.S.C. section 360bbb-3(b)(1), unless the authorization is terminated or revoked sooner.  Performed at [facility], [place] [address], [place], [place] [address]      Radiology Studies: CT ABDOMEN PELVIS WO CONTRAST  Result Date: 11/09/2020 CLINICAL DATA:  Fall, right side abdominal pain EXAM: CT ABDOMEN AND PELVIS WITHOUT CONTRAST TECHNIQUE: Multidetector CT imaging of the abdomen and pelvis was performed following the standard protocol without IV contrast. COMPARISON:  10/06/2020 FINDINGS: Lower chest:  Coronary artery calcifications and aortic calcifications. No acute abnormality. Hepatobiliary: No focal liver abnormality is seen. Status post cholecystectomy. No biliary dilatation. Pancreas: No focal abnormality or ductal dilatation. Spleen: No focal abnormality.  Normal size. Adrenals/Urinary Tract: No adrenal abnormality. No focal renal abnormality. No stones or hydronephrosis. Urinary bladder is unremarkable. Stomach/Bowel: Normal appendix. Stomach, large and small bowel grossly unremarkable. Vascular/Lymphatic: Heavily calcified aorta and iliac vessels. No evidence of aneurysm or adenopathy. Reproductive: Uterus and adnexa unremarkable.  No mass. Other: No free fluid or free air. Musculoskeletal: No acute bony abnormality. IMPRESSION: No evidence of solid organ injury. Heavily calcified aorta and branch vessels.  Sigmoid diverticulosis. Electronically Signed   By: Rolm Baptise M.D.   On: 11/09/2020 19:16   DG Chest 2 View  Result Date: 11/09/2020 CLINICAL DATA:  Chest pain after fall EXAM: CHEST - 2 VIEW COMPARISON:  March 09, 2012 FINDINGS: The heart size and mediastinal contours are within normal limits. Both lungs are clear. The visualized skeletal structures are unremarkable. IMPRESSION: No active cardiopulmonary disease. Electronically Signed   By: Dorise Bullion III M.D   On: 11/09/2020 19:24   CT Angio Abd/Pel w/ and/or w/o  Result Date: 11/11/2020 CLINICAL DATA:  70 year old female with concern for mesenteric ischemia. Abdominal pain. EXAM: CTA ABDOMEN AND PELVIS WITHOUT AND WITH CONTRAST TECHNIQUE: Multidetector CT imaging of the abdomen and pelvis was performed using the standard protocol during bolus administration of intravenous contrast. Multiplanar reconstructed images and MIPs were obtained and reviewed to evaluate the vascular anatomy. CONTRAST:  167mL OMNIPAQUE IOHEXOL 350 MG/ML SOLN COMPARISON:  CT abdomen pelvis dated 11/09/2020. FINDINGS: VASCULAR Aorta: Advanced atherosclerotic  calcification of the abdominal aorta. No aneurysmal dilatation or dissection. No periaortic fluid collection or hematoma. Celiac: Atherosclerotic calcification of the origin of the celiac axis. The celiac artery and its major branches remain patent. There is atherosclerotic calcification of the mesenteric vessels. SMA: Advanced atherosclerotic calcification of the origin of the SMA with focal high-grade narrowing. There is mild dilatation of the SMA measuring up to 6 mm in diameter, likely poststenotic dilatation. The SMA is patent. Renals: Atherosclerotic calcification of the origins of the renal arteries. The renal arteries remain patent. 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 IMA: Atherosclerotic calcification of the proximal IMA. The IMA is patent. Inflow: Advanced atherosclerotic calcification of the iliac arteries. The iliac arteries remain patent. Proximal Outflow: Advanced atherosclerotic calcification with narrowing or strictured vessels. Partially visualized stent in the proximal left superficial femoral artery. The proximal outflow arteries appear patent. Veins: The IVC is unremarkable. The SMV, splenic vein, and main portal vein are patent. No portal venous gas. Review of the MIP images confirms the above findings. NON-VASCULAR Lower chest: Bibasilar linear atelectasis/scarring. The visualized lung bases are otherwise clear. No intra-abdominal free air or free fluid. Hepatobiliary: The liver is unremarkable. There is mild intrahepatic biliary ductal dilatation, likely post cholecystectomy. No retained calcified stone noted in the central CBD. Pancreas: Unremarkable. No pancreatic ductal dilatation or surrounding inflammatory changes. Spleen: Normal in size without focal abnormality. Adrenals/Urinary Tract: The adrenal glands unremarkable. There is no hydronephrosis on either side. The visualized ureters and urinary bladder appear unremarkable. Stomach/Bowel: There is sigmoid diverticulosis with  muscular hypertrophy. No active inflammatory changes. There are scattered colonic diverticula. There is no bowel obstruction or active inflammation. There is a 3.5 cm duodenal diverticulum without active inflammation. The appendix is normal. Lymphatic: No adenopathy. Reproductive: The uterus is poorly visualized.  No adnexal masses. Other: None Musculoskeletal: Degenerative changes of the spine. Old lower thoracic compression fracture. No acute osseous pathology. IMPRESSION: 1. No acute intra-abdominal or pelvic pathology. No CT evidence of mesenteric ischemia. 2. Advanced atherosclerotic calcification of the aorta and mesenteric vessels as described. 3. Colonic diverticulosis. No bowel obstruction. Normal appendix. Electronically Signed   By: Anner Crete M.D.   On: 11/11/2020 00:26   CT Angio Abd/Pel w/ and/or w/o  Final Result    DG Chest 2 View  Final Result    CT ABDOMEN PELVIS WO CONTRAST  Final Result      Scheduled Meds: . atorvastatin  80 mg Oral Daily  .  clopidogrel  75 mg Oral Daily  . donepezil  5 mg Oral QHS  . enoxaparin (LOVENOX) injection  40 mg Subcutaneous Q2200  . escitalopram  20 mg Oral Daily  . fluticasone  2 spray Each Nare Daily  . insulin aspart  0-9 Units Subcutaneous TID WC  . insulin glargine  20 Units Subcutaneous QHS  . losartan  50 mg Oral Daily  . metoprolol succinate  25 mg Oral Daily  . mometasone-formoterol  2 puff Inhalation BID  . pregabalin  50 mg Oral TID   PRN Meds: acetaminophen **OR** acetaminophen, ondansetron **OR** ondansetron (ZOFRAN) IV, oxyCODONE, traZODone Continuous Infusions: . sodium chloride Stopped (11/11/20 0835)     LOS: 2 days  Time spent: Greater than 50% of the 35 minute visit was spent in counseling/coordination of care for the patient as laid out in the A&P.   Dwyane Dee, MD [facility] 11/11/2020, 12:12 PM

## 2020-11-12 ENCOUNTER — Other Ambulatory Visit (INDEPENDENT_AMBULATORY_CARE_PROVIDER_SITE_OTHER): Payer: Self-pay | Admitting: Vascular Surgery

## 2020-11-12 DIAGNOSIS — E440 Moderate protein-calorie malnutrition: Secondary | ICD-10-CM | POA: Insufficient documentation

## 2020-11-12 DIAGNOSIS — R1084 Generalized abdominal pain: Secondary | ICD-10-CM | POA: Diagnosis not present

## 2020-11-12 DIAGNOSIS — R109 Unspecified abdominal pain: Secondary | ICD-10-CM | POA: Diagnosis not present

## 2020-11-12 LAB — GLUCOSE, CAPILLARY
Glucose-Capillary: 65 mg/dL — ABNORMAL LOW (ref 70–99)
Glucose-Capillary: 168 mg/dL — ABNORMAL HIGH (ref 70–99)
Glucose-Capillary: 118 mg/dL — ABNORMAL HIGH (ref 70–99)
Glucose-Capillary: 95 mg/dL (ref 70–99)

## 2020-11-12 LAB — CBC WITH DIFFERENTIAL/PLATELET
Abs Immature Granulocytes: 0.12 10*3/uL — ABNORMAL HIGH (ref 0.00–0.07)
Basophils Absolute: 0 10*3/uL (ref 0.0–0.1)
Basophils Relative: 0 %
Eosinophils Absolute: 0.1 10*3/uL (ref 0.0–0.5)
Eosinophils Relative: 0 %
HCT: 34 % — ABNORMAL LOW (ref 36.0–46.0)
Hemoglobin: 11 g/dL — ABNORMAL LOW (ref 12.0–15.0)
Immature Granulocytes: 1 %
Lymphocytes Relative: 12 %
Lymphs Abs: 2 10*3/uL (ref 0.7–4.0)
MCH: 30 pg (ref 26.0–34.0)
MCHC: 32.4 g/dL (ref 30.0–36.0)
MCV: 92.6 fL (ref 80.0–100.0)
Monocytes Absolute: 1.2 10*3/uL — ABNORMAL HIGH (ref 0.1–1.0)
Monocytes Relative: 7 %
Neutro Abs: 13.9 10*3/uL — ABNORMAL HIGH (ref 1.7–7.7)
Neutrophils Relative %: 80 %
Platelets: 475 10*3/uL — ABNORMAL HIGH (ref 150–400)
RBC: 3.67 MIL/uL — ABNORMAL LOW (ref 3.87–5.11)
RDW: 14.8 % (ref 11.5–15.5)
WBC: 17.3 10*3/uL — ABNORMAL HIGH (ref 4.0–10.5)
nRBC: 0 % (ref 0.0–0.2)

## 2020-11-12 LAB — BASIC METABOLIC PANEL
Anion gap: 5 (ref 5–15)
BUN: 17 mg/dL (ref 8–23)
CO2: 23 mmol/L (ref 22–32)
Calcium: 8.5 mg/dL — ABNORMAL LOW (ref 8.9–10.3)
Chloride: 112 mmol/L — ABNORMAL HIGH (ref 98–111)
Creatinine, Ser: 0.81 mg/dL (ref 0.44–1.00)
GFR, Estimated: >60 mL/min (ref >60)
Glucose, Bld: 57 mg/dL — ABNORMAL LOW (ref 70–99)
Potassium: 4.9 mmol/L (ref 3.5–5.1)
Sodium: 140 mmol/L (ref 135–145)

## 2020-11-12 LAB — MAGNESIUM: Magnesium: 2.6 mg/dL — ABNORMAL HIGH (ref 1.7–2.4)

## 2020-11-12 MED ORDER — INSULIN GLARGINE 100 UNIT/ML ~~LOC~~ SOLN
10.0000 [IU] | Freq: Every day | SUBCUTANEOUS | Status: DC
  Administered 2020-11-12: 10 [IU] via SUBCUTANEOUS
  Filled 2020-11-12 (×2): qty 0.1

## 2020-11-12 MED ORDER — ADULT MULTIVITAMIN W/MINERALS CH
1.0000 | ORAL_TABLET | Freq: Every day | ORAL | Status: DC
  Administered 2020-11-12 – 2020-11-15 (×3): 1 via ORAL
  Filled 2020-11-12 (×3): qty 1

## 2020-11-12 MED ORDER — SENNOSIDES-DOCUSATE SODIUM 8.6-50 MG PO TABS
1.0000 | ORAL_TABLET | Freq: Every day | ORAL | Status: DC
  Administered 2020-11-12 – 2020-11-15 (×3): 1 via ORAL
  Filled 2020-11-12 (×3): qty 1

## 2020-11-12 MED ORDER — SODIUM CHLORIDE 0.9 % IV SOLN: INTRAVENOUS | Status: DC

## 2020-11-12 MED ORDER — DIPHENHYDRAMINE HCL 50 MG/ML IJ SOLN
25.0000 mg | Freq: Once | INTRAMUSCULAR | Status: AC
  Administered 2020-11-12: 25 mg via INTRAVENOUS
  Filled 2020-11-12: qty 1

## 2020-11-12 MED ORDER — LORATADINE 10 MG PO TABS
10.0000 mg | ORAL_TABLET | Freq: Every day | ORAL | Status: DC
  Administered 2020-11-12 – 2020-11-15 (×3): 10 mg via ORAL
  Filled 2020-11-12 (×4): qty 1

## 2020-11-12 MED ORDER — DIPHENHYDRAMINE HCL 50 MG/ML IJ SOLN
25.0000 mg | Freq: Once | INTRAMUSCULAR | Status: AC
  Administered 2020-11-13: 25 mg via INTRAVENOUS
  Filled 2020-11-12: qty 1

## 2020-11-12 NOTE — Progress Notes (Signed)
Pt CBG of 65. Pt is being provided juice. Pt is currently eating provided breakfast trey. Asymptomatic of hypoglycemic symptoms. Will continue to monitor.

## 2020-11-12 NOTE — Progress Notes (Signed)
Pt's husband brought in home prescription of Zyrtec as well as unidentified pill in plastic bag. Pt and husband were told that no home medications can be taken during hospital stay. Husband agreed to take all home medications back home.

## 2020-11-12 NOTE — Evaluation (Signed)
Physical Therapy Evaluation Patient Details Name: Heather [name] MRN: [id number] DOB: [birth year] Today's Date: 11/12/2020   History of Present Illness  Pt is a 70 y/o F presented from urgent care via EMS on 11/09/20 with c/c of abdominal pain & syncope. Pt had labs significant for K 2.7 and elevated Cr significant for AKI. CT abdomen obtained neg for acute process but with heavily calcified aorta and branch vessels. CXR negative. GI consulted & pt was found to have advanced atherosclerotic calcification of the aorta and mesenteric vessels.  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 Pt is able to complete sit<>stand & stand pivot transfers with HHA & min assist, but gait is deferred 2/2 low BP. Once in recliner, pt attempts to perform BUE shoulder flexion with head reclined & BLE elevated but no improvement noted in BP so pt assisted back to bed where she required assistance to elevate BLE onto bed. PT encouraged pt to move BLE while in the bed & educated pt on recommendation of STR upon d/c as pt notes she required assistance prior to admission & her husband is not in good health himself but pt very eager to go home vs STR. Will continue to follow pt acutely to progress gait with LRAD & for endurance & balance training.   BP checked in LUE during session: Sitting EOB: 110/50 mmHg (MAP 70) Standing EOB: 84/64 mmHg (MAP 68) Long sitting in recliner after stand pivot: 104/47 mmHg (62) Continued sitting in recliner: 104/42 mmHg (MAP 61) Supine in bed: 106/39 mmHg (MAP 62) Supine at end of session: 108/44 mmHg (MAP 64)     Follow Up Recommendations SNF;Supervision for mobility/OOB    Equipment Recommendations  Rolling walker with 5"  wheels    Recommendations for Other Services OT consult     Precautions / Restrictions Precautions Precautions: Fall Precaution Comments: orthostatic Restrictions Weight Bearing Restrictions: No      Mobility  Bed Mobility Overal bed mobility: Needs Assistance Bed Mobility: Sit to Supine       Sit to supine: Mod assist;HOB elevated   General bed mobility comments: assistance to elevate BLE onto bed; once supine pt is able to scoot to [facility] without assistance    Transfers Overall transfer level: Needs assistance   Transfers: Sit to/from Stand;Stand Pivot Transfers Sit to Stand: Min assist;Supervision Stand pivot transfers: Min assist       General transfer comment: STS with supervision on first trial, min assist 2nd trial with extra time to complete movement, stand pivot with BUE HHA & min assist  Ambulation/Gait Ambulation/Gait assistance:  (deferred 2/2 low BP)              Stairs            Wheelchair Mobility    Modified Rankin (Stroke Patients Only)       Balance Overall balance assessment: Needs assistance Sitting-balance support: Feet supported;No upper extremity supported Sitting balance-Leahy Scale: Good     Standing balance support: No upper extremity supported Standing balance-Leahy Scale: Fair Standing balance comment: static standing without UE support with CGA without LOB noted                             Pertinent  Vitals/Pain Pain Assessment: 0-10 Pain Score: 9  Pain Location: R side/back, ribs Pain Descriptors / Indicators: Sharp;Jabbing;Grimacing Pain Intervention(s): Patient requesting pain meds-RN notified;Monitored during session    [place] expects to be discharged to:: Private residence Living Arrangements: Spouse/significant other Available Help at Discharge: Family;Available PRN/intermittently Type of Home: Mobile home Home Access: Stairs to enter Entrance Stairs-Rails: Right;Left;Can  reach both Entrance Stairs-Number of Steps: 6 Home Layout: One level Home Equipment: Cane - single point (rollator)      Prior Function Level of Independence: Needs assistance   Gait / Transfers Assistance Needed: ambulates around home without AD; assistance from husband for stair negotiation     Comments: pt notes 2 falls in the past 6 months 2/2 she "got dizzy & passed out"     Hand Dominance        Extremity/Trunk Assessment   Upper Extremity Assessment Upper Extremity Assessment: Generalized weakness    Lower Extremity Assessment Lower Extremity Assessment: Generalized weakness       Communication   Communication: No difficulties  Cognition Arousal/Alertness: Awake/alert Behavior During Therapy: WFL for tasks assessed/performed Overall Cognitive Status: Within Functional Limits for tasks assessed                                 General Comments: pleasant, AxO x 4      General Comments      Exercises     Assessment/Plan    PT Assessment Patient needs continued PT services  PT Problem List Decreased strength;Decreased mobility;Decreased activity tolerance;Pain;Decreased knowledge of use of DME;Decreased balance       PT Treatment Interventions Therapeutic activities;DME instruction;Gait training;Therapeutic exercise;Patient/family education;Stair training;Balance training;Functional mobility training;Neuromuscular re-education;Manual techniques    PT Goals (Current goals can be found in the Care Plan section)  Acute Rehab PT Goals Patient Stated Goal: go home, less pain PT Goal Formulation: With patient Time For Goal Achievement: 11/26/20 Potential to Achieve Goals: Good    Frequency Min 2X/week   Barriers to discharge        Co-evaluation               AM-PAC PT "6 Clicks" Mobility  Outcome Measure Help needed turning from your back to your side while in a flat bed without using bedrails?: None Help needed moving from  lying on your back to sitting on the side of a flat bed without using bedrails?: A Little Help needed moving to and from a bed to a chair (including a wheelchair)?: A Little Help needed standing up from a chair using your arms (e.g., wheelchair or bedside chair)?: A Little Help needed to walk in hospital room?: A Little Help needed climbing 3-5 steps with a railing? : A Lot 6 Click Score: 18    End of Session   Activity Tolerance: Treatment limited secondary to medical complications (Comment) Patient left: in bed;with call bell/phone within reach;with bed alarm set Nurse Communication:  (BP) PT Visit Diagnosis: Muscle weakness (generalized) (M62.81);Difficulty in walking, not elsewhere classified (R26.2);Unsteadiness on feet (R26.81)    Time: 5188-4166 PT Time Calculation (min) (ACUTE ONLY): 26 min   Charges:   PT Evaluation $PT Eval Low Complexity: 1 Low PT Treatments $Therapeutic Activity: 8-22 mins        Lavone Nian, PT, DPT 11/12/20, 3:00 PM   Waunita Schooner 11/12/2020, 2:54 PM

## 2020-11-12 NOTE — Progress Notes (Signed)
[facility] Daily Progress Note  Subjective: Patient without complaint this AM.  No acute issues overnight.  Objective: Vitals:   11/11/20 2359 11/12/20 0410 11/12/20 0756 11/12/20 1149  BP: 133/60 122/68 124/64 (!) 110/57  Pulse: (!) 55 (!) 54 60 (!) 53  Resp: 18 18 18 18   Temp: 98 F (36.7 C) 97.8 F (36.6 C) 98.6 F (37 C) 98.3 F (36.8 C)  TempSrc: Oral     SpO2: 98% 98% 92% 98%  Weight:      Height:        Intake/Output Summary (Last 24 hours) at 11/12/2020 1314 Last data filed at 11/12/2020 1014 Gross per 24 hour  Intake 848.56 ml  Output 600 ml  Net 248.56 ml   Physical Exam: A&Ox3, NAD CV: RRR Pulmonary: CTA Bilaterally Abdomen: Soft, Nontender, Nondistended Vascular: Warm distally to toes   Laboratory: CBC    Component Value Date/Time   WBC 17.3 (H) 11/12/2020 0618   HGB 11.0 (L) 11/12/2020 0618   HGB 14.3 06/20/2012 2127   HCT 34.0 (L) 11/12/2020 0618   HCT 42.9 06/20/2012 2127   PLT 475 (H) 11/12/2020 0618   PLT 357 06/20/2012 2127   BMET    Component Value Date/Time   NA 140 11/12/2020 0618   NA 135 (L) 06/20/2012 2127   K 4.9 11/12/2020 0618   K 4.0 06/20/2012 2127   CL 112 (H) 11/12/2020 0618   CL 100 06/20/2012 2127   CO2 23 11/12/2020 0618   CO2 27 06/20/2012 2127   GLUCOSE 57 (L) 11/12/2020 0618   GLUCOSE 250 (H) 06/20/2012 2127   BUN 17 11/12/2020 0618   BUN 20 (H) 06/20/2012 2127   CREATININE 0.81 11/12/2020 0618   CREATININE 0.60 06/20/2012 2127   CALCIUM 8.5 (L) 11/12/2020 0618   CALCIUM 8.8 06/20/2012 2127   GFRNONAA >60 11/12/2020 0618   GFRNONAA >60 06/20/2012 2127   GFRAA >60 06/20/2012 2127   Assessment/Planning: The patient is a 70 year old female with multiple medical issues with a chief complaint of progressively worsening abdominal pain  1) 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 2) in the setting of progressively worsening abdominal pain / diarrhea recommend the patient  undergo a mesenteric angiogram and attempt assess the patient's anatomy and contributing degree of atherosclerotic disease. 3) if appropriate, an attempt to revascularize the mesentery made at that time 4) patient.  All questions were answered.  Patient wishes to proceed.  We will plan on mesenteric angiogram with possible intervention tomorrow with Dr. Delana Meyer.  Discussed with Dr. Eber Hong Stegmayer PA-C 11/12/2020 1:14 PM

## 2020-11-12 NOTE — H&P (View-Only) (Signed)
[facility] Daily Progress Note  Subjective: Patient without complaint this AM.  No acute issues overnight.  Objective: Vitals:   11/11/20 2359 11/12/20 0410 11/12/20 0756 11/12/20 1149  BP: 133/60 122/68 124/64 (!) 110/57  Pulse: (!) 55 (!) 54 60 (!) 53  Resp: 18 18 18 18   Temp: 98 F (36.7 C) 97.8 F (36.6 C) 98.6 F (37 C) 98.3 F (36.8 C)  TempSrc: Oral     SpO2: 98% 98% 92% 98%  Weight:      Height:        Intake/Output Summary (Last 24 hours) at 11/12/2020 1314 Last data filed at 11/12/2020 1014 Gross per 24 hour  Intake 848.56 ml  Output 600 ml  Net 248.56 ml   Physical Exam: A&Ox3, NAD CV: RRR Pulmonary: CTA Bilaterally Abdomen: Soft, Nontender, Nondistended Vascular: Warm distally to toes   Laboratory: CBC    Component Value Date/Time   WBC 17.3 (H) 11/12/2020 0618   HGB 11.0 (L) 11/12/2020 0618   HGB 14.3 06/20/2012 2127   HCT 34.0 (L) 11/12/2020 0618   HCT 42.9 06/20/2012 2127   PLT 475 (H) 11/12/2020 0618   PLT 357 06/20/2012 2127   BMET    Component Value Date/Time   NA 140 11/12/2020 0618   NA 135 (L) 06/20/2012 2127   K 4.9 11/12/2020 0618   K 4.0 06/20/2012 2127   CL 112 (H) 11/12/2020 0618   CL 100 06/20/2012 2127   CO2 23 11/12/2020 0618   CO2 27 06/20/2012 2127   GLUCOSE 57 (L) 11/12/2020 0618   GLUCOSE 250 (H) 06/20/2012 2127   BUN 17 11/12/2020 0618   BUN 20 (H) 06/20/2012 2127   CREATININE 0.81 11/12/2020 0618   CREATININE 0.60 06/20/2012 2127   CALCIUM 8.5 (L) 11/12/2020 0618   CALCIUM 8.8 06/20/2012 2127   GFRNONAA >60 11/12/2020 0618   GFRNONAA >60 06/20/2012 2127   GFRAA >60 06/20/2012 2127   Assessment/Planning: The patient is a 70 year old female with multiple medical issues with a chief complaint of progressively worsening abdominal pain  1) 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 2) in the setting of progressively worsening abdominal pain / diarrhea recommend the patient  undergo a mesenteric angiogram and attempt assess the patient's anatomy and contributing degree of atherosclerotic disease. 3) if appropriate, an attempt to revascularize the mesentery made at that time 4) patient.  All questions were answered.  Patient wishes to proceed.  We will plan on mesenteric angiogram with possible intervention tomorrow with Dr. Delana Meyer.  Discussed with Dr. Eber Hong Lucille Witts PA-C 11/12/2020 1:14 PM

## 2020-11-12 NOTE — Progress Notes (Signed)
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  She has had associated vomiting and diarrhea.  She also endorsed recent weight loss.  Her pain seems worsened with eating.  She was treated in February for diverticulitis with antibiotics. She is continuing to smoke.  Due to contrast allergy she underwent CT abdomen/pelvis without contrast which showed heavily calcified aorta and branch vessels. She was started on IV fluids and admitted for further work-up and monitoring.  GI was initially consulted on admission.  Given her history of atherosclerosis and symptoms that appeared concerning for mesenteric ischemia, she was recommended for a CTA abdomen/pelvis.  She underwent premedications due to history of iodine allergy and underwent testing. She was found to have advanced atherosclerotic calcification of the aorta and mesenteric vessels.  Origin of the SMA showed focal high-grade narrowing with advanced calcification.  Vascular surgery consult was recommended per GI.  She was evaluated by vascular surgery and was recommended for angiography.  Tentative plan is for procedure early this week.   Interval History:  No events overnight.  She has not had a bowel movement since admission.  She does endorse that she has diarrhea at home which has been ongoing for approximately 1 year and she is supposed to see GI outpatient but had not done so yet prior to this hospitalization. She is having flatus.  We discussed starting a stool softener daily which she does at  home sometimes.  ROS: Constitutional: negative for chills and fevers, Respiratory: negative for cough, Cardiovascular: negative for chest pain and Gastrointestinal: positive for abdominal pain  Assessment & Plan: * Abdominal pain -Appears she has underlying vascular disease history, continues to smoke, and is now having abdominal pain associated with food intake and weight loss.  Concern is for chronic mesenteric ischemia.  Other considered differential would be infectious etiology -Continue fluids -CTA abdomen/pelvis shows advanced atherosclerotic calcification of the origin of the SMA with focal high-grade narrowing - GI now following peripherally -Seen by vascular surgery; plan is for angiography this week  Syncope-resolved as of 11/10/2020 -Appears she has had poor oral intake with GI losses from vomiting and diarrhea.  Etiology likely volume depletion and orthostatic -Continue fluids -Hold home BP meds  Asymptomatic bacteriuria -Patient has no urinary symptoms at this time.  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 Delana Meyer on 03/15/20  Hypertension, benign -BP has stabilized.  Resume losartan and Toprol  Diabetes mellitus ([place]) -Continue Lantus -Continue SSI and CBG monitoring -A1c  8.2%  CAD (coronary artery disease) -Continue aspirin,  Lipitor  (HFpEF) heart failure with preserved ejection fraction (HCC) -Lasix on hold - eating well, can d/c IVF -Follow volume status clinically   Old records reviewed in assessment of this patient  Antimicrobials: n/a  DVT prophylaxis: enoxaparin (LOVENOX) injection 40 mg Start: 11/10/20 2200   Code Status:   Code Status: Full Code Family Communication: Husband  Disposition Plan: Status is: Inpatient  Remains inpatient appropriate because:Ongoing diagnostic testing needed not appropriate for outpatient work up, IV treatments appropriate due to intensity of illness or inability to take PO and Inpatient level of care appropriate due to severity of illness   Dispo: The patient is from: Home              Anticipated d/c is to: Home              Patient currently is not medically stable to d/c.   Difficult to place patient No  Risk of unplanned readmission score: Unplanned Admission- Pilot do not use: 21.28   Objective: Blood pressure (!) 110/57, pulse (!) 53, temperature 98.3 F (36.8 C), resp. rate 18, height 4\' 11"  (1.499 m), weight 54.4 kg, SpO2 98 %.  Examination: General appearance: alert, cooperative and no distress Head: Normocephalic, without obvious abnormality, atraumatic Eyes: EOMI Lungs: clear to auscultation bilaterally Heart: regular rate and rhythm and S1, S2 normal Abdomen: Soft, obese, nondistended.  Mild nonspecific tenderness, no rebound/guarding Extremities: no edema Skin: mobility and turgor normal Neurologic: Grossly normal  Consultants:   GI  Vascular surgery  Procedures:   n/a  Data Reviewed: I have personally reviewed following labs and imaging studies Results for orders placed or performed during the hospital encounter of 11/09/20 (from the past 24 hour(s))  Glucose, capillary     Status: Abnormal   Collection Time: 11/11/20 11:54 AM  Result Value Ref Range   Glucose-Capillary 316 (H) 70 - 99 mg/dL  Glucose, capillary     Status:  Abnormal   Collection Time: 11/11/20  4:45 PM  Result Value Ref Range   Glucose-Capillary 236 (H) 70 - 99 mg/dL  Glucose, capillary     Status: Abnormal   Collection Time: 11/11/20  8:18 PM  Result Value Ref Range   Glucose-Capillary 141 (H) 70 - 99 mg/dL  Basic metabolic panel     Status: Abnormal   Collection Time: 11/12/20  6:18 AM  Result Value Ref Range   Sodium 140 135 - 145 mmol/L   Potassium 4.9 3.5 - 5.1 mmol/L   Chloride 112 (H) 98 - 111 mmol/L   CO2 23 22 - 32 mmol/L   Glucose, Bld 57 (L) 70 - 99 mg/dL   BUN 17 8 - 23 mg/dL   Creatinine, Ser 0.81 0.44 - 1.00 mg/dL   Calcium 8.5 (L) 8.9 - 10.3 mg/dL   GFR, Estimated >60 >60 mL/min   Anion gap 5 5 - 15  CBC with Differential/Platelet     Status: Abnormal   Collection Time: 11/12/20  6:18 AM  Result Value Ref Range   WBC 17.3 (H) 4.0 - 10.5 K/uL   RBC 3.67 (L) 3.87 - 5.11 MIL/uL   Hemoglobin 11.0 (L) 12.0 - 15.0 g/dL   HCT 34.0 (L) 36.0 - 46.0 %   MCV 92.6 80.0 - 100.0 fL   MCH 30.0 26.0 - 34.0 pg   MCHC 32.4 30.0 - 36.0 g/dL   RDW 14.8 11.5 - 15.5 %   Platelets 475 (H) 150 -  400 K/uL   nRBC 0.0 0.0 - 0.2 %   Neutrophils Relative % 80 %   Neutro Abs 13.9 (H) 1.7 - 7.7 K/uL   Lymphocytes Relative 12 %   Lymphs Abs 2.0 0.7 - 4.0 K/uL   Monocytes Relative 7 %   Monocytes Absolute 1.2 (H) 0.1 - 1.0 K/uL   Eosinophils Relative 0 %   Eosinophils Absolute 0.1 0.0 - 0.5 K/uL   Basophils Relative 0 %   Basophils Absolute 0.0 0.0 - 0.1 K/uL   Immature Granulocytes 1 %   Abs Immature Granulocytes 0.12 (H) 0.00 - 0.07 K/uL  Magnesium     Status: Abnormal   Collection Time: 11/12/20  6:18 AM  Result Value Ref Range   Magnesium 2.6 (H) 1.7 - 2.4 mg/dL  Glucose, capillary     Status: Abnormal   Collection Time: 11/12/20  7:57 AM  Result Value Ref Range   Glucose-Capillary 65 (L) 70 - 99 mg/dL  Glucose, capillary     Status: Abnormal   Collection Time: 11/12/20 11:48 AM  Result Value Ref Range   Glucose-Capillary 168  (H) 70 - 99 mg/dL    Recent Results (from the past 240 hour(s))  Urine culture     Status: Abnormal   Collection Time: 11/09/20  4:52 PM   Specimen: Urine, Random  Result Value Ref Range Status   Specimen Description   Final    URINE, RANDOM Performed at [facility], [address], [place], [place] [address]    Special Requests   Final    NONE Performed at [facility], [address] [place], [place] [address]    Culture MULTIPLE SPECIES PRESENT, SUGGEST RECOLLECTION (A)  Final   Report Status 11/11/2020 FINAL  Final  SARS CORONAVIRUS 2 (TAT 6-24 HRS) Nasopharyngeal Nasopharyngeal Swab     Status: None   Collection Time: 11/10/20  3:07 AM   Specimen: Nasopharyngeal Swab  Result Value Ref Range Status   SARS Coronavirus 2 NEGATIVE NEGATIVE Final    Comment: (NOTE) SARS-CoV-2 target nucleic acids are NOT DETECTED.  The SARS-CoV-2 RNA is generally detectable in upper and lower respiratory specimens during the acute phase of infection. Negative results do not preclude SARS-CoV-2 infection, do not rule out co-infections with other pathogens, and should not be used as the sole basis for treatment or other patient management decisions. Negative results must be combined with clinical observations, patient history, and epidemiological information. The expected result is Negative.  Fact Sheet for Patients: SugarRoll.be  Fact Sheet for Healthcare Providers: https://www.woods-mathews.com/  This test is not yet approved or cleared by the [place] FDA and  has been authorized for detection and/or diagnosis of SARS-CoV-2 by FDA under an Emergency Use Authorization (EUA). This EUA will remain  in effect (meaning this test can be used) for the duration of the COVID-19 declaration under Se ction 564(b)(1) of the Act, 21 U.S.C. section 360bbb-3(b)(1), unless the authorization is terminated or revoked sooner.  Performed  at [facility], [place] [address], [place], [place] [address]      Radiology Studies: CT Angio Abd/Pel w/ and/or w/o  Result Date: 11/11/2020 CLINICAL DATA:  70 year old female with concern for mesenteric ischemia. Abdominal pain. EXAM: CTA ABDOMEN AND PELVIS WITHOUT AND WITH CONTRAST TECHNIQUE: Multidetector CT imaging of the abdomen and pelvis was performed using the standard protocol during bolus administration of intravenous contrast. Multiplanar reconstructed images and MIPs were obtained and reviewed to evaluate the vascular anatomy. CONTRAST:  120mL OMNIPAQUE IOHEXOL 350 MG/ML SOLN COMPARISON:  CT abdomen pelvis dated 11/09/2020. FINDINGS: VASCULAR Aorta: Advanced atherosclerotic calcification of the abdominal aorta. No aneurysmal dilatation or dissection. No periaortic fluid collection or hematoma. Celiac: Atherosclerotic calcification of the origin of the celiac axis. The celiac artery and its major branches remain patent. There is atherosclerotic calcification of the mesenteric vessels. SMA: Advanced atherosclerotic calcification of the origin of the SMA with focal high-grade narrowing. There is mild dilatation of the SMA measuring up to 6 mm in diameter, likely poststenotic dilatation. The SMA is patent. Renals: Atherosclerotic calcification of the origins of the renal arteries. The renal arteries remain patent. 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 IMA: Atherosclerotic calcification of the proximal IMA. The IMA is patent. Inflow: Advanced atherosclerotic calcification of the iliac arteries. The iliac arteries remain patent. Proximal Outflow: Advanced atherosclerotic calcification with narrowing or strictured vessels. Partially visualized stent in the proximal left superficial femoral artery. The proximal outflow arteries appear patent. Veins: The IVC is unremarkable. The SMV, splenic vein, and main portal vein are patent. No portal venous gas. Review of the MIP images confirms the  above findings. NON-VASCULAR Lower chest: Bibasilar linear atelectasis/scarring. The visualized lung bases are otherwise clear. No intra-abdominal free air or free fluid. Hepatobiliary: The liver is unremarkable. There is mild intrahepatic biliary ductal dilatation, likely post cholecystectomy. No retained calcified stone noted in the central CBD. Pancreas: Unremarkable. No pancreatic ductal dilatation or surrounding inflammatory changes. Spleen: Normal in size without focal abnormality. Adrenals/Urinary Tract: The adrenal glands unremarkable. There is no hydronephrosis on either side. The visualized ureters and urinary bladder appear unremarkable. Stomach/Bowel: There is sigmoid diverticulosis with muscular hypertrophy. No active inflammatory changes. There are scattered colonic diverticula. There is no bowel obstruction or active inflammation. There is a 3.5 cm duodenal diverticulum without active inflammation. The appendix is normal. Lymphatic: No adenopathy. Reproductive: The uterus is poorly visualized.  No adnexal masses. Other: None Musculoskeletal: Degenerative changes of the spine. Old lower thoracic compression fracture. No acute osseous pathology. IMPRESSION: 1. No acute intra-abdominal or pelvic pathology. No CT evidence of mesenteric ischemia. 2. Advanced atherosclerotic calcification of the aorta and mesenteric vessels as described. 3. Colonic diverticulosis. No bowel obstruction. Normal appendix. Electronically Signed   By: Anner Crete M.D.   On: 11/11/2020 00:26   CT Angio Abd/Pel w/ and/or w/o  Final Result    DG Chest 2 View  Final Result    CT ABDOMEN PELVIS WO CONTRAST  Final Result      Scheduled Meds: . atorvastatin  80 mg Oral Daily  . clopidogrel  75 mg Oral Daily  . donepezil  5 mg Oral QHS  . enoxaparin (LOVENOX) injection  40 mg Subcutaneous Q2200  . escitalopram  20 mg Oral Daily  . fluticasone  2 spray Each Nare Daily  . insulin aspart  0-9 Units Subcutaneous TID  WC  . insulin glargine  10 Units Subcutaneous QHS  . loratadine  10 mg Oral Daily  . losartan  50 mg Oral Daily  . metoprolol succinate  25 mg Oral Daily  . mometasone-formoterol  2 puff Inhalation BID  . pregabalin  50 mg Oral TID  . senna-docusate  1 tablet Oral Daily   PRN Meds: acetaminophen **OR** acetaminophen, ondansetron **OR** ondansetron (ZOFRAN) IV, oxyCODONE, traZODone Continuous Infusions:    LOS: 3 days  Time spent: Greater than 50% of the 35 minute visit was spent in counseling/coordination of care for the patient as laid out  in the A&P.   Dwyane Dee, MD [facility] 11/12/2020, 11:53 AM

## 2020-11-12 NOTE — Progress Notes (Signed)
Initial Nutrition Assessment  DOCUMENTATION CODES:   Non-severe (moderate) malnutrition in context of chronic illness  INTERVENTION:   -Magic cup TID with meals, each supplement provides 290 kcal and 9 grams of protein -MVI with minerals daily  NUTRITION DIAGNOSIS:   Moderate Malnutrition related to chronic illness (possible mesenteric ischemia) as evidenced by energy intake < or equal to 75% for > or equal to 1 month,mild fat depletion,moderate fat depletion,mild muscle depletion,moderate muscle depletion.  GOAL:   Patient will meet greater than or equal to 90% of their needs  MONITOR:   PO intake,Supplement acceptance,Labs,Weight trends,Skin,I & O's  REASON FOR ASSESSMENT:   Malnutrition Screening Tool    ASSESSMENT:   Heather [name] is a 70 y.o. female with PMH of bipolar disorder, T2DM, HTN, CAD, PAD, Schizophrenia who presents for abdominal pain. Reports 8 months of chronic abdominal pain occurring almost daily. Located generalized across middle of stomach. Very severe. Reports occurs after eating. Occasional vomiting. Recurrent diarrhea. Occasional blood in stool.  She was seen in ED in February and treated for diverticulitis with antibiotics. Reports no change in symptoms with course of treatment. Has bene unable to see PCP or GI. Has had >10 lb weight loss.  Pt admitted with abdominal pain.   Reviewed I/O's: -150 ml x 24 hours and +2.6 L since admission  UOP: 900 ml x2 4 hours  Per MD notes, pt with possible mesenteric ischemia. Per [facility] notes, plan to angiogram later this week.   Spoke with pt at bedside, who reports intake has improved since hospitalization; noted pt consumed 100% of breakfast (documented meal completions 100%).   Pt endorses a general decline in health over the past 6-8 months, since experiencing abdominal pain with eating. She shares that she usually consumes 1 meal per day, which consists of sandwich OR soup OR pice of meat with some toast. She also  drinks [facility] and water throughout the day. Intake has steadily decline and pt shares that her portion sizes have decreased over time. She has eliminated nuts and seeds in her diet, as this seems to aggravate the abdominal pain the most, although she associates abdominal pain with eating overall.   Per pt, her UBW is around 125#. She endorses a 10-15 pounds weight loss over the past 6-8 months. Reviewed wt hx; pt has experienced a 5.2% wt loss over the past 8 months, which is not significant for time frame.   Pt reports she has been less steady on her feet and is more weak at home. She has had more frequent falls and is unable to cook for herself at home. She uses a walker outside the home at baseline. She has dentures, but often does not use them to eat; she is able to chew food without difficulty and find meal items she enjoys.   Discussed importance of good meal and supplement intake to promote healing. Pt does not like the taste of oral nutrition supplement shakes, but is amenable to [employer].   Medications reviewed and include 0.9% sodium chloride infusion @ 75 ml/hr.   Labs reviewed: CBGS: 65-236 (inpatient orders for glycemic control are 0-9 units insulin aspart TID with meals and 10 units inuslin glargine daily).   NUTRITION - FOCUSED PHYSICAL EXAM:  Flowsheet Row Most Recent Value  Orbital Region Moderate depletion  Upper Arm Region Moderate depletion  Thoracic and Lumbar Region No depletion  Buccal Region Mild depletion  Temple Region Mild depletion  Clavicle Bone Region No depletion  Clavicle  and Acromion Bone Region No depletion  Scapular Bone Region No depletion  Dorsal Hand Mild depletion  Patellar Region Moderate depletion  Anterior Thigh Region Moderate depletion  Posterior Calf Region Moderate depletion  Edema (RD Assessment) None  Hair Reviewed  Eyes Reviewed  Mouth Reviewed  Skin Reviewed  Nails Reviewed       Diet Order:   Diet Order            Diet  heart healthy/carb modified Room service appropriate? Yes; Fluid consistency: Thin  Diet effective now                 EDUCATION NEEDS:   Education needs have been addressed  Skin:  Skin Assessment: Reviewed RN Assessment  Last BM:  11/10/20  Height:   Ht Readings from Last 1 Encounters:  11/09/20 4\' 11"  (1.499 m)    Weight:   Wt Readings from Last 1 Encounters:  11/09/20 54.4 kg    Ideal Body Weight:  44.7 kg  BMI:  Body mass index is 24.24 kg/m.  Estimated Nutritional Needs:   Kcal:  1700-1900  Protein:  95-110 grams  Fluid:  > 1.7 L    Loistine Chance, RD, LDN, [place] Registered Dietitian II Certified Diabetes Care and Education Specialist Please refer to [facility] for RD and/or RD on-call/weekend/after hours pager

## 2020-11-13 ENCOUNTER — Encounter
Admission: EM | Disposition: A | Discharge status: Home Health | Payer: Self-pay | Source: Ambulatory Visit | Attending: Internal Medicine

## 2020-11-13 DIAGNOSIS — R109 Unspecified abdominal pain: Secondary | ICD-10-CM | POA: Diagnosis not present

## 2020-11-13 DIAGNOSIS — K55059 Acute (reversible) ischemia of intestine, part and extent unspecified: Secondary | ICD-10-CM

## 2020-11-13 DIAGNOSIS — I771 Stricture of artery: Secondary | ICD-10-CM

## 2020-11-13 DIAGNOSIS — N179 Acute kidney failure, unspecified: Secondary | ICD-10-CM | POA: Diagnosis not present

## 2020-11-13 HISTORY — PX: VISCERAL ANGIOGRAPHY: CATH118276

## 2020-11-13 LAB — CBC WITH DIFFERENTIAL/PLATELET
Abs Immature Granulocytes: 0.06 10*3/uL (ref 0.00–0.07)
Basophils Absolute: 0 10*3/uL (ref 0.0–0.1)
Basophils Relative: 0 %
Eosinophils Absolute: 0.3 10*3/uL (ref 0.0–0.5)
Eosinophils Relative: 4 %
HCT: 30.3 % — ABNORMAL LOW (ref 36.0–46.0)
Hemoglobin: 9.6 g/dL — ABNORMAL LOW (ref 12.0–15.0)
Immature Granulocytes: 1 %
Lymphocytes Relative: 17 %
Lymphs Abs: 1.5 10*3/uL (ref 0.7–4.0)
MCH: 29.6 pg (ref 26.0–34.0)
MCHC: 31.7 g/dL (ref 30.0–36.0)
MCV: 93.5 fL (ref 80.0–100.0)
Monocytes Absolute: 0.9 10*3/uL (ref 0.1–1.0)
Monocytes Relative: 10 %
Neutro Abs: 6.3 10*3/uL (ref 1.7–7.7)
Neutrophils Relative %: 68 %
Platelets: 398 10*3/uL (ref 150–400)
RBC: 3.24 MIL/uL — ABNORMAL LOW (ref 3.87–5.11)
RDW: 14.9 % (ref 11.5–15.5)
WBC: 9.2 10*3/uL (ref 4.0–10.5)
nRBC: 0 % (ref 0.0–0.2)

## 2020-11-13 LAB — GLUCOSE, CAPILLARY
Glucose-Capillary: 56 mg/dL — ABNORMAL LOW (ref 70–99)
Glucose-Capillary: 78 mg/dL (ref 70–99)
Glucose-Capillary: 82 mg/dL (ref 70–99)
Glucose-Capillary: 163 mg/dL — ABNORMAL HIGH (ref 70–99)
Glucose-Capillary: 59 mg/dL — ABNORMAL LOW (ref 70–99)
Glucose-Capillary: 135 mg/dL — ABNORMAL HIGH (ref 70–99)
Glucose-Capillary: 290 mg/dL — ABNORMAL HIGH (ref 70–99)

## 2020-11-13 LAB — BASIC METABOLIC PANEL
Anion gap: 3 — ABNORMAL LOW (ref 5–15)
BUN: 21 mg/dL (ref 8–23)
CO2: 22 mmol/L (ref 22–32)
Calcium: 8.1 mg/dL — ABNORMAL LOW (ref 8.9–10.3)
Chloride: 115 mmol/L — ABNORMAL HIGH (ref 98–111)
Creatinine, Ser: 0.77 mg/dL (ref 0.44–1.00)
GFR, Estimated: >60 mL/min (ref >60)
Glucose, Bld: 105 mg/dL — ABNORMAL HIGH (ref 70–99)
Potassium: 4 mmol/L (ref 3.5–5.1)
Sodium: 140 mmol/L (ref 135–145)

## 2020-11-13 LAB — MAGNESIUM: Magnesium: 2.3 mg/dL (ref 1.7–2.4)

## 2020-11-13 SURGERY — VISCERAL ANGIOGRAPHY
Anesthesia: Moderate Sedation

## 2020-11-13 MED ORDER — DEXTROSE 50 % IV SOLN
1.0000 | Freq: Once | INTRAVENOUS | Status: AC
  Administered 2020-11-13: 50 mL via INTRAVENOUS

## 2020-11-13 MED ORDER — CEFAZOLIN SODIUM-DEXTROSE 2-4 GM/100ML-% IV SOLN
2.0000 g | Freq: Once | INTRAVENOUS | Status: AC
  Administered 2020-11-13: 2 g via INTRAVENOUS
  Filled 2020-11-13: qty 100

## 2020-11-13 MED ORDER — METHYLPREDNISOLONE SODIUM SUCC 125 MG IJ SOLR
INTRAMUSCULAR | Status: AC
  Administered 2020-11-13: 125 mg via INTRAVENOUS
  Filled 2020-11-13: qty 2

## 2020-11-13 MED ORDER — FENTANYL CITRATE (PF) 100 MCG/2ML IJ SOLN
INTRAMUSCULAR | Status: DC | PRN
  Administered 2020-11-13: 25 ug via INTRAVENOUS
  Administered 2020-11-13: 50 ug via INTRAVENOUS
  Administered 2020-11-13: 25 ug via INTRAVENOUS

## 2020-11-13 MED ORDER — FENTANYL CITRATE (PF) 100 MCG/2ML IJ SOLN
INTRAMUSCULAR | Status: AC
  Filled 2020-11-13: qty 2

## 2020-11-13 MED ORDER — DIPHENHYDRAMINE HCL 50 MG/ML IJ SOLN
50.0000 mg | Freq: Once | INTRAMUSCULAR | Status: AC | PRN
  Administered 2020-11-13: 50 mg via INTRAVENOUS

## 2020-11-13 MED ORDER — SODIUM CHLORIDE 0.9% FLUSH
3.0000 mL | Freq: Two times a day (BID) | INTRAVENOUS | Status: DC
  Administered 2020-11-13 – 2020-11-15 (×3): 3 mL via INTRAVENOUS

## 2020-11-13 MED ORDER — DEXTROSE 50 % IV SOLN
INTRAVENOUS | Status: AC
  Filled 2020-11-13: qty 50

## 2020-11-13 MED ORDER — ONDANSETRON HCL 4 MG/2ML IJ SOLN: 4.0000 mg | Freq: Four times a day (QID) | INTRAMUSCULAR | Status: DC | PRN

## 2020-11-13 MED ORDER — HEPARIN SODIUM (PORCINE) 1000 UNIT/ML IJ SOLN
INTRAMUSCULAR | Status: AC
  Filled 2020-11-13: qty 1

## 2020-11-13 MED ORDER — SODIUM CHLORIDE 0.9 % IV SOLN: INTRAVENOUS | Status: AC

## 2020-11-13 MED ORDER — FAMOTIDINE 20 MG PO TABS: 40.0000 mg | ORAL_TABLET | Freq: Once | ORAL | Status: AC | PRN

## 2020-11-13 MED ORDER — HEPARIN SODIUM (PORCINE) 1000 UNIT/ML IJ SOLN
INTRAMUSCULAR | Status: DC | PRN
  Administered 2020-11-13: 4000 [IU] via INTRAVENOUS

## 2020-11-13 MED ORDER — MIDAZOLAM HCL 5 MG/5ML IJ SOLN
INTRAMUSCULAR | Status: AC
  Filled 2020-11-13: qty 5

## 2020-11-13 MED ORDER — SODIUM CHLORIDE 0.9 % IV SOLN: INTRAVENOUS | Status: DC

## 2020-11-13 MED ORDER — HYDROMORPHONE HCL 1 MG/ML IJ SOLN: 1.0000 mg | Freq: Once | INTRAMUSCULAR | Status: DC | PRN

## 2020-11-13 MED ORDER — DIPHENHYDRAMINE HCL 50 MG/ML IJ SOLN
INTRAMUSCULAR | Status: AC
  Filled 2020-11-13: qty 1

## 2020-11-13 MED ORDER — MIDAZOLAM HCL 2 MG/ML PO SYRP: 8.0000 mg | ORAL_SOLUTION | Freq: Once | ORAL | Status: DC | PRN

## 2020-11-13 MED ORDER — FAMOTIDINE 20 MG PO TABS
ORAL_TABLET | ORAL | Status: AC
  Administered 2020-11-13: 40 mg via ORAL
  Filled 2020-11-13: qty 2

## 2020-11-13 MED ORDER — MIDAZOLAM HCL 2 MG/2ML IJ SOLN
INTRAMUSCULAR | Status: DC | PRN
  Administered 2020-11-13 (×2): 1 mg via INTRAVENOUS
  Administered 2020-11-13: 2 mg via INTRAVENOUS

## 2020-11-13 MED ORDER — METHYLPREDNISOLONE SODIUM SUCC 125 MG IJ SOLR: 125.0000 mg | Freq: Once | INTRAMUSCULAR | Status: AC | PRN

## 2020-11-13 MED ORDER — SODIUM CHLORIDE 0.9 % IV SOLN: 250.0000 mL | INTRAVENOUS | Status: DC | PRN

## 2020-11-13 MED ORDER — SODIUM CHLORIDE 0.9% FLUSH: 3.0000 mL | INTRAVENOUS | Status: DC | PRN

## 2020-11-13 MED ORDER — IODIXANOL 320 MG/ML IV SOLN
INTRAVENOUS | Status: DC | PRN
  Administered 2020-11-13: 40 mL via INTRA_ARTERIAL

## 2020-11-13 SURGICAL SUPPLY — 26 items
BALLN MUSTANG 7.0X20 75 (BALLOONS) ×2
BALLN ULTRV 018 4X40X75 (BALLOONS) ×2
BALLN ULTRVRSE 4X40X75C (BALLOONS) ×2
BALLOON MUSTANG 7.0X20 75 (BALLOONS) ×1 IMPLANT
BALLOON ULTRV 018 4X40X75 (BALLOONS) ×1 IMPLANT
BALLOON ULTRVRSE 4X40X75C (BALLOONS) ×1 IMPLANT
CATH ANGIO 5F 80CM MHK1-H (CATHETERS) ×2 IMPLANT
CATH ANGIO 5F PIGTAIL 65CM (CATHETERS) ×2 IMPLANT
COVER EZ STRL 42X30 (DRAPES) ×2 IMPLANT
DEVICE STARCLOSE SE CLOSURE (Vascular Products) ×2 IMPLANT
DEVICE TORQUE (MISCELLANEOUS) ×2 IMPLANT
GAUZE SPONGE 4X4 12PLY STRL (GAUZE/BANDAGES/DRESSINGS) ×2 IMPLANT
GLIDECATH 4FR STR (CATHETERS) ×2 IMPLANT
GLIDEWIRE STIFF .35X180X3 HYDR (WIRE) ×2 IMPLANT
GUIDEWIRE ADV .018X180CM (WIRE) ×2 IMPLANT
GUIDEWIRE SUPER STIFF .035X180 (WIRE) ×2 IMPLANT
KIT ENCORE 26 ADVANTAGE (KITS) ×2 IMPLANT
NEEDLE ENTRY 21GA 7CM ECHOTIP (NEEDLE) ×2 IMPLANT
PACK ANGIOGRAPHY (CUSTOM PROCEDURE TRAY) ×2 IMPLANT
SHEATH ANL2 6FRX45 HC (SHEATH) ×2 IMPLANT
SHEATH BRITE TIP 5FRX11 (SHEATH) ×2 IMPLANT
SHEATH RAABE 6FR (SHEATH) ×2 IMPLANT
STENT LIFESTREAM 6X16X80 (Permanent Stent) ×2 IMPLANT
SYR MEDRAD MARK 7 150ML (SYRINGE) ×2 IMPLANT
TUBING CONTRAST HIGH PRESS 72 (TUBING) ×2 IMPLANT
WIRE GUIDERIGHT .035X150 (WIRE) ×2 IMPLANT

## 2020-11-13 NOTE — Interval H&P Note (Signed)
History and Physical Interval Note:  11/13/2020 3:32 PM  Heather [name]  has presented today for surgery, with the diagnosis of Mesenteric ischemia.  The various methods of treatment have been discussed with the patient and family. After consideration of risks, benefits and other options for treatment, the patient has consented to  Procedure(s): VISCERAL ANGIOGRAPHY (N/A) as a surgical intervention.  The patient's history has been reviewed, patient examined, no change in status, stable for surgery.  I have reviewed the patient's chart and labs.  Questions were answered to the patient's satisfaction.     [name]

## 2020-11-13 NOTE — Op Note (Signed)
[facility] Percutaneous Study/Intervention Procedural Note   Date of Surgery: 11/13/2020  Surgeon(s): [name]   Assistants:None  Pre-operative Diagnosis: Chronic mesenteric ischemia; stricture of the superior mesenteric artery Post-operative diagnosis: Same  Procedure(s) Performed: 1. Ultrasound guidance for vascular access right femoral artery 2. Catheter placement into SMA from right femoral approach 3. Aortogram and selective SMA angiogram 4. Balloon expandable stent placement to the superior mesenteric artery using a 6 mm diameter x 16 mm length lifestream balloon expandable stent postdilated to 7 mm 5. StarClose closure device right femoral artery  Anesthesia: Continuous ECG pulse oximetry and cardiopulmonary monitoring was performed throughout the entire procedure by the interventional radiology nurse.  Parenteral Versed and fentanyl were administered.  Total sedation time was 1 hour 8 minutes and 52 seconds.  Fluoro Time: 15.3 minutes  Contrast: 40 cc  Indications: Patient is a 70 year old with an extensive vascular history who presented with increasing abdominal pain and GI bleed.  CTA has demonstrated a critical lesion in the superior mesenteric artery.  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  Given these findings concerns for bowel ischemia and mesenteric ischemia are raised.  Angiogram is performed to evaluate the lesion more thoroughly and potentially allow treatment. Risks and benefits were discussed and informed consent was obtained  Procedure: The patient was identified and appropriate procedural time out was performed. The patient was then placed supine on the table and prepped and draped in the usual sterile fashion.Moderate conscious sedation was administered during a face to face encounter with the patient with the RN monitoring  their vital signs, mental status, telemetry and pulse oximetry throughout the procedure.    The ultrasound was used to evaluate the right common femoral artery. It was patent . A digital ultrasound image was acquired. A micropuncture needle was used to access the right common femoral artery under direct ultrasound guidance and a permanent image was performed. A 0.035 J wire was advanced without resistance and a 5Fr sheath was placed. Pigtail catheter was then placed into the aorta and initially an AP aortogram was performed which showed the approximate level of the origin of the SMA and celiac arteries.  Lateral projection view was performed which showed typical orientations of the celiac and SMA.  Lateral image showed critical, greater than 95%, stenosis of the superior mesenteric artery.  Celiac artery appears to have less than 50% stenosis.  I then selective cannulated the SMA with a VS 1 catheter. Selective imaging of the superior mesenteric artery demonstrated as noted above a greater than 95% stenosis at the origin of the SMA. The patient was then given 5000 units of intravenous heparin and I exchanged for a 6 [place] Ansell sheath and using a combination of the VS 1 catheter the stiff angle Glidewire and the Ansell catheter I was able to get the VS 1 catheter to track into the mid SMA where the Glidewire was exchanged for an Amplatz wire.  The sheath was then able to be tracked across the lesion.  Again hand-injection of contrast demonstrated the distal SMA and reflux contrast to assess the stenosis as well. I then selected a 6 mm diameter by 16 mm length lifestream balloon expandable stent and deployed this across the stenosis at the origin of the superior mesenteric artery just back into the aorta in excellent location. The balloon was inflated to 12 atm.  Approximately 40% residual stenosis was seen on completion angiogram and I elected to advance  a 7 mm x 20 mm Mustang balloon across the stent and  postdilated to 12 atm for 30 seconds.  Follow-up imaging now demonstrated less than 10% residual stenosis with wide patency of the distal SMA and I terminate the procedure. The guide catheter was removed and oblique arteriogram was performed of the right femoral artery. StarClose closure device was deployed in the usual fashion with excellent hemostatic result.  Findings:  Aortogram:AP projection of the aorta is consistent with an aneurysm repair using a bifurcated dacryon graft graft.  Visceral segment of the or aorta appears otherwise normal with the SMA and celiac at approximately L1. SMA:The lateral projection demonstrates the origin of the SMA has a focal 95% stenosis.  This is approximately 10 mm in length.  Distal to this lesion the SMA appears normal.  Following angioplasty and stent placement as described above there is less than 10% residual stenosis.     Disposition: Patient was taken to the recovery room in stable condition having tolerated the procedure well.   [name] 11/13/2020 5:00 PM   This note was created with Dragon Medical transcription system. Any errors in dictation are purely unintentional.

## 2020-11-13 NOTE — Progress Notes (Signed)
Inpatient Diabetes Program Recommendations  AACE/ADA: New Consensus Statement on Inpatient Glycemic Control (2015)  Target Ranges:  Prepandial:   less than 140 mg/dL      Peak postprandial:   less than 180 mg/dL (1-2 hours)      Critically ill patients:  140 - 180 mg/dL   Lab Results  Component Value Date   GLUCAP 82 11/13/2020   HGBA1C 8.2 (H) 11/10/2020    Review of Glycemic Control Results for ELLAMARIE, NAEVE (MRN [id number]) as of 11/13/2020 13:11  Ref. Range 11/12/2020 16:11 11/12/2020 20:17 11/13/2020 08:24 11/13/2020 09:24 11/13/2020 11:29  Glucose-Capillary Latest Ref Range: 70 - 99 mg/dL 118 (H) 95 56 (L) 78 82   Diabetes history: DM 2 Outpatient Diabetes medications:  Lantus 20 units q HS, Humalog 4-8 units bid Current orders for Inpatient glycemic control:  Lantus 10 units q HS, Novolog sensitive tid with meals Inpatient Diabetes Program Recommendations:   Note low blood sugar this AM. Consider reducing Lantus to 7 units q HS and reduce Novolog correction to "very sensitive" tid with meals.   Thanks,  Adah Perl, RN, BC-ADM Inpatient Diabetes Coordinator Pager [phone] (8a-5p)

## 2020-11-13 NOTE — Progress Notes (Signed)
PT Cancellation Note  Patient Details Name: Heather [name] MRN: [id number] DOB: [birth year]   Cancelled Treatment:    Reason Eval/Treat Not Completed: Patient at procedure or test/unavailable Pt out of room for procedure.  Will maintain on caseload and continue to follow as appropriate.  Kreg Shropshire, DPT 11/13/2020, 3:07 PM

## 2020-11-13 NOTE — TOC Initial Note (Signed)
Transition of Care [facility]) - Initial/Assessment Note    Patient Details  Name: Heather [name] MRN: [id number] Date of Birth: June 04, 1951  Transition of Care [employer]) CM/SW Contact:    Shelbie Hutching, RN Phone Number: 11/13/2020, 2:22 PM  Clinical Narrative:                 Patient admitted to the hospital with abdominal pain CT scan shows vascular stenosis.  Vascular surgery is going to take patient today for mesenteric angiography.   RNCM met with patient and patient's husband at the bedside.  Husband Paulo Fruit states he goes by [employer].  Patient is from home with her husband in [place].  Patient is normally independent at home, she does have a walker but has not needed to use it.  Shorty does not let the patient drives but he provides all needed transportation.  Patient reports that she is [facility] at [facility].   PT has recommended SNF but patient declines.  She does agree to home health services.  [place] with [facility] has accepted referral for RN, PT, and OT.   TOC will cont to follow.   Expected Discharge Plan: [place] Barriers to Discharge: Continued Medical Work up   Patient Goals and CMS Choice Patient states their goals for this hospitalization and ongoing recovery are:: Wants to have something to eat and know when her procedure is going to be CMS Medicare.gov Compare Post Acute Care list provided to:: Patient Choice offered to / list presented to : Patient  Expected Discharge Plan and Services Expected Discharge Plan: [place]   Discharge Planning Services: CM Consult Post Acute Care Choice: [place] arrangements for the past 2 months: Mobile Home                 DME Arranged: N/A DME Agency: NA       HH Arranged: RN,PT,OT [place] Agency: [facility] Date [place]: 11/13/20 Time HH Agency Contacted: 32 Representative spoke with at [place]: [place] Arrangements/Services Living  arrangements for the past 2 months: Mobile Home Lives with:: Spouse Patient language and need for interpreter reviewed:: Yes Do you feel safe going back to the place where you live?: Yes      Need for Family Participation in Patient Care: Yes (Comment) Care giver support system in place?: Yes (comment) (husband) Current home services: DME (walker) Criminal Activity/Legal Involvement Pertinent to Current Situation/Hospitalization: No - Comment as needed  Activities of Daily Living Home Assistive Devices/Equipment: None ADL Screening (condition at time of admission) Patient's cognitive ability adequate to safely complete daily activities?: Yes Is the patient deaf or have difficulty hearing?: No Does the patient have difficulty seeing, even when wearing glasses/contacts?: No Does the patient have difficulty concentrating, remembering, or making decisions?: Yes Patient able to express need for assistance with ADLs?: Yes Does the patient have difficulty dressing or bathing?: No Independently performs ADLs?: Yes (appropriate for developmental age) Does the patient have difficulty walking or climbing stairs?: Yes Weakness of Legs: Both Weakness of Arms/Hands: None  Permission Sought/Granted Permission sought to share information with : Case Manager,Family Supports,Other (comment) Permission granted to share information with : Yes, Verbal Permission Granted  Share Information with NAME: [name]  Permission granted to share info w AGENCY: Home health agencies  Permission granted to share info w Relationship: husband     Emotional Assessment Appearance:: Appears older than stated age Attitude/Demeanor/Rapport: Engaged Affect (typically  observed): Accepting Orientation: : Oriented to Self,Oriented to Place,Oriented to  Time,Oriented to Situation Alcohol / Substance Use: Not Applicable Psych Involvement: No (comment)  Admission diagnosis:  Abdominal pain [R10.9] Patient Active Problem List    Diagnosis Date Noted  . Malnutrition of moderate degree 11/12/2020  . Asymptomatic bacteriuria 11/10/2020  . Abdominal pain 11/09/2020  . Hypokalemia 11/09/2020  . AKI (acute kidney injury) ([place]) 11/09/2020  . MDD (major depressive disorder), recurrent, in full remission ([place]) 01/16/2020  . Anxiety disorder 01/16/2020  . Hyperlipidemia 12/15/2019  . Carotid stenosis 12/15/2019  . MDD (major depressive disorder), recurrent, in partial remission ([place]) 08/24/2019  . MDD (major depressive disorder), recurrent episode, mild ([place]) 04/08/2019  . Insomnia due to mental condition 04/08/2019  . Major neurocognitive disorder due to Alzheimer's disease, probable, without behavioral disturbance 04/08/2019  . DDD (degenerative disc disease), cervical 12/19/2016  . Frequent falls 12/10/2016  . Uncontrolled type 2 diabetes mellitus with insulin therapy ([place]) 12/10/2016  . 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 Nonrheumatic aortic valve stenosis 09/30/2016  . (HFpEF) heart failure with preserved ejection fraction ([place]) 09/20/2016  . COPD (chronic obstructive pulmonary disease) ([place]) 09/20/2016  . Respiratory failure with hypoxia ([place]) 09/20/2016  . NSTEMI (non-ST elevated myocardial infarction) ([place]) 08/21/2016  . SOB (shortness of breath) 08/18/2016  . Atherosclerosis of native arteries of extremity with intermittent claudication ([place]) 01/08/2016  . Palpitations 07/10/2015  . History of nonmelanoma skin cancer 03/16/2015  . Pseudoaneurysm following procedure ([place]) 07/19/2014  . Femoral artery occlusion, right ([place]) 06/28/2014  . Claudication ([place]) 06/05/2014  . Aortic insufficiency 03/20/2014  . CAD (coronary artery disease) 03/20/2014  . Chronic pain 03/20/2014  . Memory loss 11/09/2012  . Depressive disorder 08/02/2012  . Neck pain 08/02/2012  . Malaise and fatigue 08/02/2012  . Hypertension, benign 08/02/2012  . Dizziness 08/02/2012  . Polyneuropathy in diabetes  ([place]) 08/02/2012  . Rheumatic fever with heart involvement 08/02/2012  . Tobacco use disorder 08/02/2012  . Diabetes mellitus ([place]) 08/02/1990   PCP:  [name], [place] [facility]:   [address], [place] [address] Phone: [phone] Fax: [place], [place]. [place] [address] Phone: [phone] Fax: [phone]     Social Determinants of Health (SDOH) Interventions    Readmission Risk Interventions No flowsheet data found.

## 2020-11-13 NOTE — Progress Notes (Signed)
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  She has had associated vomiting and diarrhea.  She also endorsed recent weight loss.  Her pain seems worsened with eating.  She was treated in February for diverticulitis with antibiotics. She is continuing to smoke.  Due to contrast allergy she underwent CT abdomen/pelvis without contrast which showed heavily calcified aorta and branch vessels. She was started on IV fluids and admitted for further work-up and monitoring.  GI was initially consulted on admission.  Given her history of atherosclerosis and symptoms that appeared concerning for mesenteric ischemia, she was recommended for a CTA abdomen/pelvis.  She underwent premedications due to history of iodine allergy and underwent testing. She was found to have advanced atherosclerotic calcification of the aorta and mesenteric vessels.  Origin of the SMA showed focal high-grade narrowing with advanced calcification.  Vascular surgery consult was recommended per GI.  She was evaluated by vascular surgery and was recommended for angiography on 11/13/20.    Interval History:  No events overnight.  Husband present bedside this morning.  She is undergoing angiography today.  She had no other concerns when seen this morning.  ROS: Constitutional: negative for chills and fevers, Respiratory: negative for cough, Cardiovascular: negative for chest pain and Gastrointestinal: positive for abdominal pain  Assessment & Plan: * Abdominal pain -Appears she has  underlying vascular disease history, continues to smoke, and is now having abdominal pain associated with food intake and weight loss.  Concern is for chronic mesenteric ischemia.  Other considered differential would be infectious etiology -Continue fluids -CTA abdomen/pelvis shows advanced atherosclerotic calcification of the origin of the SMA with focal high-grade narrowing - GI now following peripherally; still needs outpatient work-up for her diarrhea (although no diarrhea or BM even since admission) -Seen by vascular surgery; plan is for angiography this week  Syncope-resolved as of 11/10/2020 -Appears she has had poor oral intake with GI losses from vomiting and diarrhea.  Etiology likely volume depletion and orthostatic -Hold home BP meds - s/p IVF; resumed for angio, d/c soon if renal function stable  Asymptomatic bacteriuria -Patient has no urinary symptoms at this time.  UA reviewed from admission -Agree for holding on further antibiotics at this time  AKI (acute kidney injury) ([place]) - baseline creatinine ~ 0.6 - patient presents with increase in creat >0.3 mg/dL above baseline presumed to have occurred within past 7 days PTA -Improved with fluids which are discontinued on 11/12/2020 -Follow BMP daily - IVF resumed on 3/22 for angio; d/c morning after procedure if renal function stable  Hypokalemia -Also considered due to GI losses -Replete and recheck as needed  S/P TAVR (transcatheter aortic valve replacement) - history of severe AI/AS - s/p TAVR 09/2016  Atherosclerosis of native arteries of extremity with intermittent claudication ([place]) - continue asa/plavix - follows with vascular surgery outpatient; last seen by Dr. Delana Meyer on 03/15/20  Hypertension, benign -BP has stabilized.  Resume losartan and Toprol  Diabetes mellitus (HCC) -Continue Lantus -Continue SSI and CBG monitoring -A1c 8.2%  CAD (coronary artery disease) -Continue aspirin,  Lipitor  (HFpEF) heart  failure with preserved ejection fraction (HCC) -Lasix on hold - eating well, can d/c IVF -Follow volume status clinically   Old records reviewed in assessment of this patient  Antimicrobials: n/a  DVT prophylaxis: enoxaparin (LOVENOX) injection 40 mg Start: 11/10/20 2200   Code Status:   Code Status: Full Code Family Communication: Husband  Disposition Plan: Status is: Inpatient  Remains inpatient appropriate because:Ongoing diagnostic testing needed not appropriate for outpatient work up, IV treatments appropriate due to intensity of illness or inability to take PO and Inpatient level of care appropriate due to severity of illness   Dispo: The patient is from: Home              Anticipated d/c is to: Home              Patient currently is not medically stable to d/c.   Difficult to place patient No  Risk of unplanned readmission score: Unplanned Admission- Pilot do not use: 22.27   Objective: Blood pressure (!) 125/57, pulse 64, temperature 98.2 F (36.8 C), temperature source Oral, resp. rate 17, height 4\' 11"  (1.499 m), weight 54.4 kg, SpO2 98 %.  Examination: General appearance: alert, cooperative and no distress Head: Normocephalic, without obvious abnormality, atraumatic Eyes: EOMI Lungs: clear to auscultation bilaterally Heart: regular rate and rhythm and S1, S2 normal Abdomen: Soft, obese, nondistended.  Mild nonspecific tenderness, no rebound/guarding Extremities: no edema Skin: mobility and turgor normal Neurologic: Grossly normal  Consultants:   GI  Vascular surgery  Procedures:   n/a  Data Reviewed: I have personally reviewed following labs and imaging studies Results for orders placed or performed during the hospital encounter of 11/09/20 (from the past 24 hour(s))  Glucose, capillary     Status: Abnormal   Collection Time: 11/12/20  4:11 PM  Result Value Ref Range   Glucose-Capillary 118 (H) 70 - 99 mg/dL  Glucose, capillary     Status: None    Collection Time: 11/12/20  8:17 PM  Result Value Ref Range   Glucose-Capillary 95 70 - 99 mg/dL  Basic metabolic panel     Status: Abnormal   Collection Time: 11/13/20  5:08 AM  Result Value Ref Range   Sodium 140 135 - 145 mmol/L   Potassium 4.0 3.5 - 5.1 mmol/L   Chloride 115 (H) 98 - 111 mmol/L   CO2 22 22 - 32 mmol/L   Glucose, Bld 105 (H) 70 - 99 mg/dL   BUN 21 8 - 23 mg/dL   Creatinine, Ser 0.77 0.44 - 1.00 mg/dL   Calcium 8.1 (L) 8.9 - 10.3 mg/dL   GFR, Estimated >60 >60 mL/min   Anion gap 3 (L) 5 - 15  CBC with Differential/Platelet     Status: Abnormal   Collection Time: 11/13/20  5:08 AM  Result Value Ref Range   WBC 9.2 4.0 - 10.5 K/uL   RBC 3.24 (L) 3.87 - 5.11 MIL/uL   Hemoglobin 9.6 (L) 12.0 - 15.0 g/dL   HCT 30.3 (L) 36.0 - 46.0 %   MCV 93.5 80.0 - 100.0 fL   MCH 29.6 26.0 - 34.0 pg   MCHC 31.7 30.0 - 36.0 g/dL   RDW 14.9 11.5 - 15.5 %   Platelets 398 150 - 400 K/uL   nRBC 0.0 0.0 - 0.2 %   Neutrophils Relative % 68 %   Neutro Abs 6.3 1.7 - 7.7 K/uL   Lymphocytes Relative 17 %   Lymphs Abs 1.5  0.7 - 4.0 K/uL   Monocytes Relative 10 %   Monocytes Absolute 0.9 0.1 - 1.0 K/uL   Eosinophils Relative 4 %   Eosinophils Absolute 0.3 0.0 - 0.5 K/uL   Basophils Relative 0 %   Basophils Absolute 0.0 0.0 - 0.1 K/uL   Immature Granulocytes 1 %   Abs Immature Granulocytes 0.06 0.00 - 0.07 K/uL  Magnesium     Status: None   Collection Time: 11/13/20  5:08 AM  Result Value Ref Range   Magnesium 2.3 1.7 - 2.4 mg/dL  Glucose, capillary     Status: Abnormal   Collection Time: 11/13/20  8:24 AM  Result Value Ref Range   Glucose-Capillary 56 (L) 70 - 99 mg/dL  Glucose, capillary     Status: None   Collection Time: 11/13/20  9:24 AM  Result Value Ref Range   Glucose-Capillary 78 70 - 99 mg/dL  Glucose, capillary     Status: None   Collection Time: 11/13/20 11:29 AM  Result Value Ref Range   Glucose-Capillary 82 70 - 99 mg/dL    Recent Results (from the past 240  hour(s))  Urine culture     Status: Abnormal   Collection Time: 11/09/20  4:52 PM   Specimen: Urine, Random  Result Value Ref Range Status   Specimen Description   Final    URINE, RANDOM Performed at [facility], [address], [place], [place] [address]    Special Requests   Final    NONE Performed at [facility], [address] [place], [place] [address]    Culture MULTIPLE SPECIES PRESENT, SUGGEST RECOLLECTION (A)  Final   Report Status 11/11/2020 FINAL  Final  SARS CORONAVIRUS 2 (TAT 6-24 HRS) Nasopharyngeal Nasopharyngeal Swab     Status: None   Collection Time: 11/10/20  3:07 AM   Specimen: Nasopharyngeal Swab  Result Value Ref Range Status   SARS Coronavirus 2 NEGATIVE NEGATIVE Final    Comment: (NOTE) SARS-CoV-2 target nucleic acids are NOT DETECTED.  The SARS-CoV-2 RNA is generally detectable in upper and lower respiratory specimens during the acute phase of infection. Negative results do not preclude SARS-CoV-2 infection, do not rule out co-infections with other pathogens, and should not be used as the sole basis for treatment or other patient management decisions. Negative results must be combined with clinical observations, patient history, and epidemiological information. The expected result is Negative.  Fact Sheet for Patients: SugarRoll.be  Fact Sheet for Healthcare Providers: https://www.woods-mathews.com/  This test is not yet approved or cleared by the [place] FDA and  has been authorized for detection and/or diagnosis of SARS-CoV-2 by FDA under an Emergency Use Authorization (EUA). This EUA will remain  in effect (meaning this test can be used) for the duration of the COVID-19 declaration under Se ction 564(b)(1) of the Act, 21 U.S.C. section 360bbb-3(b)(1), unless the authorization is terminated or revoked sooner.  Performed at [facility], [place] [address], [place],   [address]      Radiology Studies: No results found. CT Angio Abd/Pel w/ and/or w/o  Final Result    DG Chest 2 View  Final Result    CT ABDOMEN PELVIS WO CONTRAST  Final Result      Scheduled Meds: . atorvastatin  80 mg Oral Daily  . clopidogrel  75 mg Oral Daily  . donepezil  5 mg Oral QHS  . enoxaparin (LOVENOX) injection  40 mg Subcutaneous Q2200  . escitalopram  20 mg  Oral Daily  . fluticasone  2 spray Each Nare Daily  . insulin aspart  0-9 Units Subcutaneous TID WC  . loratadine  10 mg Oral Daily  . losartan  50 mg Oral Daily  . metoprolol succinate  25 mg Oral Daily  . mometasone-formoterol  2 puff Inhalation BID  . multivitamin with minerals  1 tablet Oral Daily  . pregabalin  50 mg Oral TID  . senna-docusate  1 tablet Oral Daily   PRN Meds: acetaminophen **OR** acetaminophen, diphenhydrAMINE, famotidine, HYDROmorphone (DILAUDID) injection, methylPREDNISolone (SOLU-MEDROL) injection, midazolam, ondansetron **OR** ondansetron (ZOFRAN) IV, oxyCODONE, traZODone Continuous Infusions: . sodium chloride 75 mL/hr at 11/13/20 1321  . sodium chloride    .  ceFAZolin (ANCEF) IV       LOS: 4 days  Time spent: Greater than 50% of the 35 minute visit was spent in counseling/coordination of care for the patient as laid out in the A&P.   Dwyane Dee, MD [facility] 11/13/2020, 2:29 PM

## 2020-11-13 NOTE — Care Management Important Message (Signed)
Important Message  Patient Details  Name: Heather [name] MRN: [id number] Date of Birth: [birth year]   Medicare Important Message Given:  Yes     Juliann Pulse A Laban Orourke 11/13/2020, 10:46 AM

## 2020-11-13 NOTE — Evaluation (Signed)
Occupational Therapy Evaluation Patient Details Name: Heather [name] MRN: [id number] DOB: 1951-08-20 Today's Date: 11/13/2020    History of Present Illness Pt is a 70 y/o F presented from urgent care via EMS on 11/09/20 with c/c of abdominal pain & syncope. Pt had labs significant for K 2.7 and elevated Cr significant for AKI. CT abdomen obtained neg for acute process but with heavily calcified aorta and branch vessels. CXR negative. GI consulted & pt was found to have advanced atherosclerotic calcification of the aorta and mesenteric vessels.  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 Given her history of orthostatic hypotension and syncope, therapist took BP several times during session, with no significant changes throughout session. Heather [name] came to EOB sitting and into standing without any assistance from therapist, requiring only increased time. She performed grooming, UB bathing, and LB bathing in standing at sink, with no UE support and no LOB, standing at sink for > 10 minutes. Pt ambulated in room w/ RW, transferred to toilet, managed clothing and hygiene before/after toileting, and transferred to recliner all with SUPV/CGA -- no physical assistance from therapist. Pt reports that she has a low toilet w/o grab bars and a tub/bath combo at home, and that she sometimes struggles with coming into standing after toileting and w/ getting into/out of the tub/shower. Therefore, recommend a 3-in-1 commode that pt could place over the toilet, as well as a tub bench, so that pt could transfer into shower from a seated position. Given her  fluctuations in BP, pt could also benefit from having a home BP monitor. Overall, pt performed very well today. She displayed weakness in both UE and LE, but is flexible and demonstrates good balance in sitting and standing. Recommend ongoing OT while pt is hospitalized. DC to home with HHOT, to assist with improving strength and endurance while minimizing falls risk and improving functional mobility.   BP in supine: 122/53 BP in sitting: 123/65 BP in standing: 116/56    Follow Up Recommendations  Home health OT    Equipment Recommendations  3 in 1 bedside commode;Tub/shower bench    Recommendations for Other Services       Precautions / Restrictions Precautions Precautions: Fall Precaution Comments: orthostatic Restrictions Weight Bearing Restrictions: No      Mobility Bed Mobility Overal bed mobility: Modified Independent Bed Mobility: Sit to Supine       Sit to supine: Modified independent (Device/Increase time)   General bed mobility comments: increased time    Transfers   Equipment used: Rolling walker (2 wheeled) Transfers: Sit to/from [facility] Sit to Stand: Supervision Stand pivot transfers: Supervision            Balance Overall balance assessment: Needs assistance Sitting-balance support: Feet supported;No upper extremity supported Sitting balance-Leahy Scale: Good     Standing balance support: Bilateral upper extremity supported;During functional activity Standing balance-Leahy Scale: Good Standing balance comment: performed functional mobiliy tasks in standing w/ and w/o UE support, no LOB observed                           ADL either performed or assessed with clinical judgement  ADL Overall ADL's : Needs assistance/impaired     Grooming: Wash/dry hands;Wash/dry face;Oral care;Supervision/safety;Standing   Upper Body Bathing: Supervision/ safety;Standing   Lower Body Bathing: Supervison/ safety   Upper Body  Dressing : Modified independent;Sitting   Lower Body Dressing: Min guard Lower Body Dressing Details (indicate cue type and reason): donning/doffing socks     Toileting- Clothing Manipulation and Hygiene: Supervision/safety       Functional mobility during ADLs: Supervision/safety;Rolling walker       Vision Baseline Vision/History: Wears glasses Wears Glasses: Reading only Patient Visual Report: No change from baseline       Perception     Praxis      Pertinent Vitals/Pain Pain Assessment: 0-10 Pain Score: 8  Pain Location: abdomen, primarily on R side Pain Descriptors / Indicators: Sharp;Jabbing;Grimacing Pain Intervention(s): Limited activity within patient's tolerance;Monitored during session;Repositioned     Hand Dominance     Extremity/Trunk Assessment Upper Extremity Assessment Upper Extremity Assessment: Generalized weakness   Lower Extremity Assessment Lower Extremity Assessment: Generalized weakness       Communication Communication Communication: No difficulties   Cognition Arousal/Alertness: Awake/alert Behavior During Therapy: WFL for tasks assessed/performed Overall Cognitive Status: Within Functional Limits for tasks assessed                                 General Comments: pleasant, AxO x 4   General Comments       Exercises Other Exercises Other Exercises: bed mobility, transfers, grooming, UE and LE bathing, UE and LE dressing, toileting, ambulation w/in room   Shoulder Instructions      Home Living Family/patient expects to be discharged to:: Private residence Living Arrangements: Spouse/significant other Available Help at Discharge: Family;Available 24 hours/day Type of Home: Mobile home Home Access: Stairs to enter Entrance Stairs-Number of Steps: 6 Entrance Stairs-Rails: Right;Left;Can reach both Home Layout: One level     Bathroom Shower/Tub: Teacher, early years/pre: Standard     Home  Equipment: Cane - single point;Walker - 2 wheels;Wheelchair - power;Other (comment) (rollator)          Prior Functioning/Environment Level of Independence: Needs assistance  Gait / Transfers Assistance Needed: ambulates around home without AD; assistance from husband for stair negotiation; uses scooter at stores ADL's / Homemaking Assistance Needed: husband does shopping, cleaning, laundry, most cooking. Pt manages her own medications   Comments: pt notes 2 falls in the past 6 months 2/2 she "got dizzy & passed out"        OT Problem List: Decreased activity tolerance;Pain;Cardiopulmonary status limiting activity;Decreased strength;Impaired balance (sitting and/or standing)      OT Treatment/Interventions: Self-care/ADL training;Therapeutic exercise;Patient/family education;Balance training;Energy conservation;DME and/or AE instruction;Therapeutic activities    OT Goals(Current goals can be found in the care plan section) Acute Rehab OT Goals Patient Stated Goal: go home, less pain OT Goal Formulation: With patient Time For Goal Achievement: 11/27/20 Potential to Achieve Goals: Good ADL Goals Pt Will Transfer to Toilet: Independently;stand pivot transfer Pt/caregiver will Perform Home Exercise Program: Increased strength;Both right and left upper extremity;Independently Additional ADL Goal #1: pt will identify 2+ falls prevention techniques  OT Frequency: Min 1X/week   Barriers to D/C:            Co-evaluation              AM-PAC OT "6 Clicks" Daily Activity     Outcome Measure Help from another person eating  meals?: None Help from another person taking care of personal grooming?: None Help from another person toileting, which includes using toliet, bedpan, or urinal?: A Little Help from another person bathing (including washing, rinsing, drying)?: A Little Help from another person to put on and taking off regular upper body clothing?: None Help from another person  to put on and taking off regular lower body clothing?: A Little 6 Click Score: 21   End of Session Equipment Utilized During Treatment: Rolling walker  Activity Tolerance: Patient tolerated treatment well Patient left: in chair;with call bell/phone within reach;with chair alarm set;with family/visitor present  OT Visit Diagnosis: Muscle weakness (generalized) (M62.81);History of falling (Z91.81);Pain Pain - part of body:  (abdomen)                Time: 2482-5003 OT Time Calculation (min): 40 min Charges:  OT General Charges $OT Visit: 1 Visit OT Evaluation $OT Eval Low Complexity: 1 Low OT Treatments $Self Care/Home Management : 38-52 mins  Josiah Lobo, PhD, MS, OTR/L 11/13/20, 2:29 PM

## 2020-11-14 ENCOUNTER — Encounter: Payer: Self-pay | Admitting: Vascular Surgery

## 2020-11-14 DIAGNOSIS — R1084 Generalized abdominal pain: Secondary | ICD-10-CM | POA: Diagnosis not present

## 2020-11-14 DIAGNOSIS — R109 Unspecified abdominal pain: Secondary | ICD-10-CM | POA: Diagnosis not present

## 2020-11-14 LAB — BASIC METABOLIC PANEL
Anion gap: 4 — ABNORMAL LOW (ref 5–15)
BUN: 13 mg/dL (ref 8–23)
CO2: 20 mmol/L — ABNORMAL LOW (ref 22–32)
Calcium: 8.1 mg/dL — ABNORMAL LOW (ref 8.9–10.3)
Chloride: 114 mmol/L — ABNORMAL HIGH (ref 98–111)
Creatinine, Ser: 0.72 mg/dL (ref 0.44–1.00)
GFR, Estimated: >60 mL/min (ref >60)
Glucose, Bld: 325 mg/dL — ABNORMAL HIGH (ref 70–99)
Potassium: 4.4 mmol/L (ref 3.5–5.1)
Sodium: 138 mmol/L (ref 135–145)

## 2020-11-14 LAB — CBC WITH DIFFERENTIAL/PLATELET
Abs Immature Granulocytes: 0.07 10*3/uL (ref 0.00–0.07)
Basophils Absolute: 0 10*3/uL (ref 0.0–0.1)
Basophils Relative: 0 %
Eosinophils Absolute: 0 10*3/uL (ref 0.0–0.5)
Eosinophils Relative: 0 %
HCT: 29.7 % — ABNORMAL LOW (ref 36.0–46.0)
Hemoglobin: 9.6 g/dL — ABNORMAL LOW (ref 12.0–15.0)
Immature Granulocytes: 1 %
Lymphocytes Relative: 6 %
Lymphs Abs: 0.5 10*3/uL — ABNORMAL LOW (ref 0.7–4.0)
MCH: 29.7 pg (ref 26.0–34.0)
MCHC: 32.3 g/dL (ref 30.0–36.0)
MCV: 92 fL (ref 80.0–100.0)
Monocytes Absolute: 0.2 10*3/uL (ref 0.1–1.0)
Monocytes Relative: 3 %
Neutro Abs: 7 10*3/uL (ref 1.7–7.7)
Neutrophils Relative %: 90 %
Platelets: 401 10*3/uL — ABNORMAL HIGH (ref 150–400)
RBC: 3.23 MIL/uL — ABNORMAL LOW (ref 3.87–5.11)
RDW: 14.9 % (ref 11.5–15.5)
WBC: 7.8 10*3/uL (ref 4.0–10.5)
nRBC: 0 % (ref 0.0–0.2)

## 2020-11-14 LAB — GLUCOSE, CAPILLARY
Glucose-Capillary: 268 mg/dL — ABNORMAL HIGH (ref 70–99)
Glucose-Capillary: 222 mg/dL — ABNORMAL HIGH (ref 70–99)
Glucose-Capillary: 198 mg/dL — ABNORMAL HIGH (ref 70–99)
Glucose-Capillary: 162 mg/dL — ABNORMAL HIGH (ref 70–99)
Glucose-Capillary: 214 mg/dL — ABNORMAL HIGH (ref 70–99)

## 2020-11-14 LAB — MAGNESIUM: Magnesium: 1.9 mg/dL (ref 1.7–2.4)

## 2020-11-14 MED ORDER — ASPIRIN EC 81 MG PO TBEC
81.0000 mg | DELAYED_RELEASE_TABLET | Freq: Every day | ORAL | Status: DC
  Administered 2020-11-14 – 2020-11-15 (×2): 81 mg via ORAL
  Filled 2020-11-14 (×2): qty 1

## 2020-11-14 NOTE — Progress Notes (Signed)
PROGRESS NOTE    Heather [name]  ZHG:[id number] DOB: 04/15/1951 DOA: 11/09/2020 PCP: [name], FNP   Brief Narrative: 70 year old with past medical history significant for atherosclerosis with intermittent claudication, bilateral CAS, CAD, hypertension, diabetes type 2, emphysema, hyperlipidemia, severe AI AS status post TAVR 02 03/2017 who presented to the hospital after developing abdominal pain and reported syncope at home.  She has had associated vomiting and diarrhea.  She also endorses recent weight loss.  Her pain seems worse after eating.  She was treated in February for diverticulitis with antibiotics.  She continues to smoke.  Due to contrast allergy she underwent CT abdomen pelvis without contrast which showed heavily calcified aorta and branch vessels.  She was a started on IV fluids and admitted for further work-up and monitoring. GI was initially consulted on admission, given her history of atherosclerosis and symptoms that appears consulting with mesenteric ischemia she was recommended for a CTA abdomen and pelvis.  She underwent premedication due to history of iodine allergy and underwent testing.  She was found to have advanced atherosclerotic calcification of the aorta and mesenteric vessels.  Origin of SMA showed focal high-grade narrowing with advanced calcification.  Vascular surgery consult was recommended by GI.  She was evaluated by vascular and was recommended angiography on 10/2010/22.   Assessment & Plan:   Principal Problem:   Abdominal pain Active Problems:   (HFpEF) heart failure with preserved ejection fraction (HCC)   CAD (coronary artery disease)   Diabetes mellitus (HCC)   Hypertension, benign   Atherosclerosis of native arteries of extremity with intermittent claudication (HCC)   S/P TAVR (transcatheter aortic valve replacement)   Hypokalemia   AKI (acute kidney injury) ([place])   Asymptomatic bacteriuria   Malnutrition of moderate degree  1-Chronic  mesenteric ischemia, stricture of the superior mesenteric artery: -Presented with abdominal pain, worse after she eats. -CT a abdomen and pelvis showed advanced atherosclerotic calcification of the origins of the SMA with focal high-grade narrowing. -Underwent angiography, balloon expandable stent placement to the superior mesenteric artery using a 6 mm x 16 mm balloon expandable stent. -Patient was a started on Plavix -Started on clear diet advance as tolerated per vascular  2-Syncope: Resolved -Suspect related to hypovolemia, secondary to poor oral intake  3-AKI; Baseline creatinine 0.6.  She was treated with IV fluids.  Monitor renal function post contrast  4-Hypokalemia: Repleted  5-status post TAVR  6-atherosclerosis of native arteries of extremities with intermittent claudication Continue with aspirin and Plavix  Hypertension: Continue with losartan and Toprol  Diabetes: Continue with Lantus and sliding scale insulin  CAD: Continue with aspirin and Lipitor  Heart failure chronic diastolic heart failure: Compensated      Nutrition Problem: Moderate Malnutrition Etiology: chronic illness (possible mesenteric ischemia)    Signs/Symptoms: energy intake < or equal to 75% for > or equal to 1 month,mild fat depletion,moderate fat depletion,mild muscle depletion,moderate muscle depletion    Interventions: MVI,Magic cup  Estimated body mass index is 24.22 kg/m as calculated from the following:   Height as of this encounter: 4\' 11"  (1.499 m).   Weight as of this encounter: 54.4 kg.   DVT prophylaxis: Lovenox Code Status: Full code Family Communication: Care discussed with patient.  Disposition Plan:  Status is: Inpatient  Remains inpatient appropriate because:Inpatient level of care appropriate due to severity of illness   Dispo: The patient is from: Home              Anticipated d/c is to:  Home              Patient currently is not medically stable to d/c.    Difficult to place patient No        Consultants:   Vascular.   Procedures:   Angiography.   Antimicrobials:  None  Subjective: She is feeling better, denies abdominal pain.  Tolerating clears.   Objective: Vitals:   11/14/20 0415 11/14/20 0744 11/14/20 1151 11/14/20 1156  BP: (!) 142/56 140/64 (!) 137/57 (!) 141/130  Pulse: 64 76 67 65  Resp: 16 17 18 18   Temp: 98.5 F (36.9 C) 99 F (37.2 C) 98.5 F (36.9 C) 98.6 F (37 C)  TempSrc: Oral Oral Oral   SpO2: 97% 95% 100% 100%  Weight:      Height:        Intake/Output Summary (Last 24 hours) at 11/14/2020 1357 Last data filed at 11/14/2020 1012 Gross per 24 hour  Intake 1562.03 ml  Output 1000 ml  Net 562.03 ml   Filed Weights   11/09/20 1649 11/13/20 1445  Weight: 54.4 kg 54.4 kg    Examination:  General exam: Appears calm and comfortable  Respiratory system: Crackles bases Cardiovascular system: S1 & S2 heard, RRR. No JVD, murmurs, rubs, gallops or clicks.  Gastrointestinal system: Abdomen is nondistended, soft and nontender. No organomegaly or masses felt. Normal bowel sounds heard. Central nervous system: Alert and oriented. No focal neurological deficits. Extremities: Symmetric 5 x 5 power.   Data Reviewed: I have personally reviewed following labs and imaging studies  CBC: Recent Labs  Lab 11/10/20 0435 11/11/20 0430 11/12/20 0618 11/13/20 0508 11/14/20 0543  WBC 16.3* 11.2* 17.3* 9.2 7.8  NEUTROABS  --  10.4* 13.9* 6.3 7.0  HGB 10.9* 10.9* 11.0* 9.6* 9.6*  HCT 33.8* 33.1* 34.0* 30.3* 29.7*  MCV 92.6 93.5 92.6 93.5 92.0  PLT 484* 445* 475* 398 299*   Basic Metabolic Panel: Recent Labs  Lab 11/10/20 0435 11/11/20 0430 11/12/20 0618 11/13/20 0508 11/14/20 0543  NA 137 137 140 140 138  K 3.1* 4.8 4.9 4.0 4.4  CL 109 113* 112* 115* 114*  CO2 20* 18* 23 22 20*  GLUCOSE 142* 309* 57* 105* 325*  BUN 21 14 17 21 13   CREATININE 1.13* 0.91 0.81 0.77 0.72  CALCIUM 8.3* 8.0* 8.5*  8.1* 8.1*  MG 1.3* 1.2* 2.6* 2.3 1.9   GFR: Estimated Creatinine Clearance: 51.4 mL/min (by C-G formula based on SCr of 0.72 mg/dL). Liver Function Tests: Recent Labs  Lab 11/09/20 1651  AST 14*  ALT 9  ALKPHOS 78  BILITOT 0.5  PROT 7.1  ALBUMIN 3.4*   Recent Labs  Lab 11/09/20 1651  LIPASE 26   No results for input(s): AMMONIA in the last 168 hours. Coagulation Profile: No results for input(s): INR, PROTIME in the last 168 hours. Cardiac Enzymes: No results for input(s): CKTOTAL, CKMB, CKMBINDEX, TROPONINI in the last 168 hours. BNP (last 3 results) No results for input(s): PROBNP in the last 8760 hours. HbA1C: No results for input(s): HGBA1C in the last 72 hours. CBG: Recent Labs  Lab 11/13/20 1509 11/13/20 1645 11/13/20 2122 11/14/20 0745 11/14/20 1314  GLUCAP 163* 135* 290* 268* 222*   Lipid Profile: No results for input(s): CHOL, HDL, LDLCALC, TRIG, CHOLHDL, LDLDIRECT in the last 72 hours. Thyroid Function Tests: No results for input(s): TSH, T4TOTAL, FREET4, T3FREE, THYROIDAB in the last 72 hours. Anemia Panel: No results for input(s): VITAMINB12, FOLATE, FERRITIN, TIBC, IRON, RETICCTPCT  in the last 72 hours. Sepsis Labs: Recent Labs  Lab 11/09/20 1949  LATICACIDVEN 1.1    Recent Results (from the past 240 hour(s))  Urine culture     Status: Abnormal   Collection Time: 11/09/20  4:52 PM   Specimen: Urine, Random  Result Value Ref Range Status   Specimen Description   Final    URINE, RANDOM Performed at [facility], [address] [place], [place] [address]    Special Requests   Final    NONE Performed at [facility], [address] [place], [place] [address]    Culture MULTIPLE SPECIES PRESENT, SUGGEST RECOLLECTION (A)  Final   Report Status 11/11/2020 FINAL  Final  SARS CORONAVIRUS 2 (TAT 6-24 HRS) Nasopharyngeal Nasopharyngeal Swab     Status: None   Collection Time: 11/10/20  3:07 AM   Specimen: Nasopharyngeal Swab   Result Value Ref Range Status   SARS Coronavirus 2 NEGATIVE NEGATIVE Final    Comment: (NOTE) SARS-CoV-2 target nucleic acids are NOT DETECTED.  The SARS-CoV-2 RNA is generally detectable in upper and lower respiratory specimens during the acute phase of infection. Negative results do not preclude SARS-CoV-2 infection, do not rule out co-infections with other pathogens, and should not be used as the sole basis for treatment or other patient management decisions. Negative results must be combined with clinical observations, patient history, and epidemiological information. The expected result is Negative.  Fact Sheet for Patients: SugarRoll.be  Fact Sheet for Healthcare Providers: https://www.woods-mathews.com/  This test is not yet approved or cleared by the [place] FDA and  has been authorized for detection and/or diagnosis of SARS-CoV-2 by FDA under an Emergency Use Authorization (EUA). This EUA will remain  in effect (meaning this test can be used) for the duration of the COVID-19 declaration under Se ction 564(b)(1) of the Act, 21 U.S.C. section 360bbb-3(b)(1), unless the authorization is terminated or revoked sooner.  Performed at [facility], [place] [address] [place],  [address]          Radiology Studies: PERIPHERAL VASCULAR CATHETERIZATION  Result Date: 11/13/2020 See Op Note       Scheduled Meds: . aspirin EC  81 mg Oral Daily  . atorvastatin  80 mg Oral Daily  . clopidogrel  75 mg Oral Daily  . donepezil  5 mg Oral QHS  . enoxaparin (LOVENOX) injection  40 mg Subcutaneous Q2200  . escitalopram  20 mg Oral Daily  . fluticasone  2 spray Each Nare Daily  . insulin aspart  0-9 Units Subcutaneous TID WC  . loratadine  10 mg Oral Daily  . losartan  50 mg Oral Daily  . metoprolol succinate  25 mg Oral Daily  . mometasone-formoterol  2 puff Inhalation BID  . multivitamin with minerals  1 tablet  Oral Daily  . pregabalin  50 mg Oral TID  . senna-docusate  1 tablet Oral Daily  . sodium chloride flush  3 mL Intravenous Q12H   Continuous Infusions: . sodium chloride       LOS: 5 days    Time spent: 35 minutes    Callie Facey A Jerica Creegan, MD [facility]   If 7PM-7AM, please contact night-coverage [email or web address]  11/14/2020, 1:57 PM

## 2020-11-14 NOTE — Progress Notes (Signed)
[facility] Daily Progress Note  Subjective: 11/13/20: 1. Ultrasound guidance for vascular access right femoral artery 2. Catheter placement into SMA from right femoral approach 3. Aortogram and selective SMA angiogram 4. Balloon expandable stent placement to the superior mesenteric artery using a 6 mm diameter x 16 mm length lifestream balloon expandable stent postdilated to 7 mm 5. StarClose closure device right femoral artery  Patient without complaint this AM.  No issues overnight.  States improvement in abdominal pain and nausea.  Objective: Vitals:   11/14/20 0415 11/14/20 0744 11/14/20 1151 11/14/20 1156  BP: (!) 142/56 140/64 (!) 137/57 (!) 141/130  Pulse: 64 76 67 65  Resp: 16 17 18 18   Temp: 98.5 F (36.9 C) 99 F (37.2 C) 98.5 F (36.9 C) 98.6 F (37 C)  TempSrc: Oral Oral Oral   SpO2: 97% 95% 100% 100%  Weight:      Height:        Intake/Output Summary (Last 24 hours) at 11/14/2020 1222 Last data filed at 11/14/2020 1012 Gross per 24 hour  Intake 1562.03 ml  Output 1000 ml  Net 562.03 ml   Physical Exam: A&Ox3, NAD CV: RRR Pulmonary: CTA Bilaterally Abdomen: Soft, Nontender, Nondistended Right groin:  Access site: Clean dry and intact.  No swelling or drainage noted Vascular:   Bilateral lower extremity: Warm distally to toes   Laboratory: CBC    Component Value Date/Time   WBC 7.8 11/14/2020 0543   HGB 9.6 (L) 11/14/2020 0543   HGB 14.3 06/20/2012 2127   HCT 29.7 (L) 11/14/2020 0543   HCT 42.9 06/20/2012 2127   PLT 401 (H) 11/14/2020 0543   PLT 357 06/20/2012 2127   BMET    Component Value Date/Time   NA 138 11/14/2020 0543   NA 135 (L) 06/20/2012 2127   K 4.4 11/14/2020 0543   K 4.0 06/20/2012 2127   CL 114 (H) 11/14/2020 0543   CL 100 06/20/2012 2127   CO2 20 (L) 11/14/2020 0543   CO2 27 06/20/2012 2127   GLUCOSE 325 (H) 11/14/2020 0543   GLUCOSE 250  (H) 06/20/2012 2127   BUN 13 11/14/2020 0543   BUN 20 (H) 06/20/2012 2127   CREATININE 0.72 11/14/2020 0543   CREATININE 0.60 06/20/2012 2127   CALCIUM 8.1 (L) 11/14/2020 0543   CALCIUM 8.8 06/20/2012 2127   GFRNONAA >60 11/14/2020 0543   GFRNONAA >60 06/20/2012 2127   GFRAA >60 06/20/2012 2127   Assessment/Planning: The patient is a 70 year old female with multiple medical issues who presented with mesenteric ischemia s/p mesenteric angiogram - POD#1  1) Patient reports improvement in her symptoms. Denies any abdominal pain, nausea or vomiting. 2) Hemoglobin is stable.  Renal function normal. 3) aspirin, Plavix and statin for medical management 4) no further recommendations from vascular at this time 5) we will continue to surveilled the patient's mesenteric disease in our clinic  Discussed with Dr. Eber Hong [facility] PA-C 11/14/2020 12:22 PM

## 2020-11-15 DIAGNOSIS — I5030 Unspecified diastolic (congestive) heart failure: Secondary | ICD-10-CM | POA: Diagnosis not present

## 2020-11-15 DIAGNOSIS — R8271 Bacteriuria: Secondary | ICD-10-CM | POA: Diagnosis not present

## 2020-11-15 DIAGNOSIS — I10 Essential (primary) hypertension: Secondary | ICD-10-CM

## 2020-11-15 DIAGNOSIS — E876 Hypokalemia: Secondary | ICD-10-CM

## 2020-11-15 DIAGNOSIS — R109 Unspecified abdominal pain: Secondary | ICD-10-CM | POA: Diagnosis not present

## 2020-11-15 DIAGNOSIS — N179 Acute kidney failure, unspecified: Secondary | ICD-10-CM | POA: Diagnosis not present

## 2020-11-15 DIAGNOSIS — I70213 Atherosclerosis of native arteries of extremities with intermittent claudication, bilateral legs: Secondary | ICD-10-CM

## 2020-11-15 DIAGNOSIS — Z952 Presence of prosthetic heart valve: Secondary | ICD-10-CM

## 2020-11-15 DIAGNOSIS — I25118 Atherosclerotic heart disease of native coronary artery with other forms of angina pectoris: Secondary | ICD-10-CM

## 2020-11-15 DIAGNOSIS — R1084 Generalized abdominal pain: Secondary | ICD-10-CM | POA: Diagnosis not present

## 2020-11-15 DIAGNOSIS — E119 Type 2 diabetes mellitus without complications: Secondary | ICD-10-CM

## 2020-11-15 DIAGNOSIS — Z794 Long term (current) use of insulin: Secondary | ICD-10-CM

## 2020-11-15 LAB — CBC WITH DIFFERENTIAL/PLATELET
Abs Immature Granulocytes: 0.06 10*3/uL (ref 0.00–0.07)
Basophils Absolute: 0 10*3/uL (ref 0.0–0.1)
Basophils Relative: 0 %
Eosinophils Absolute: 0.4 10*3/uL (ref 0.0–0.5)
Eosinophils Relative: 5 %
HCT: 26.5 % — ABNORMAL LOW (ref 36.0–46.0)
Hemoglobin: 8.5 g/dL — ABNORMAL LOW (ref 12.0–15.0)
Immature Granulocytes: 1 %
Lymphocytes Relative: 21 %
Lymphs Abs: 1.8 10*3/uL (ref 0.7–4.0)
MCH: 29.5 pg (ref 26.0–34.0)
MCHC: 32.1 g/dL (ref 30.0–36.0)
MCV: 92 fL (ref 80.0–100.0)
Monocytes Absolute: 0.8 10*3/uL (ref 0.1–1.0)
Monocytes Relative: 10 %
Neutro Abs: 5.4 10*3/uL (ref 1.7–7.7)
Neutrophils Relative %: 63 %
Platelets: 324 10*3/uL (ref 150–400)
RBC: 2.88 MIL/uL — ABNORMAL LOW (ref 3.87–5.11)
RDW: 14.9 % (ref 11.5–15.5)
WBC: 8.6 10*3/uL (ref 4.0–10.5)
nRBC: 0 % (ref 0.0–0.2)

## 2020-11-15 LAB — BASIC METABOLIC PANEL
Anion gap: 3 — ABNORMAL LOW (ref 5–15)
BUN: 11 mg/dL (ref 8–23)
CO2: 23 mmol/L (ref 22–32)
Calcium: 8.3 mg/dL — ABNORMAL LOW (ref 8.9–10.3)
Chloride: 115 mmol/L — ABNORMAL HIGH (ref 98–111)
Creatinine, Ser: 0.67 mg/dL (ref 0.44–1.00)
GFR, Estimated: >60 mL/min (ref >60)
Glucose, Bld: 182 mg/dL — ABNORMAL HIGH (ref 70–99)
Potassium: 3.8 mmol/L (ref 3.5–5.1)
Sodium: 141 mmol/L (ref 135–145)

## 2020-11-15 LAB — GLUCOSE, CAPILLARY: Glucose-Capillary: 168 mg/dL — ABNORMAL HIGH (ref 70–99)

## 2020-11-15 LAB — MAGNESIUM: Magnesium: 1.9 mg/dL (ref 1.7–2.4)

## 2020-11-15 MED ORDER — CLOPIDOGREL BISULFATE 75 MG PO TABS: 75.0000 mg | ORAL_TABLET | Freq: Every day | ORAL | 6 refills | Status: DC

## 2020-11-15 MED ORDER — FUROSEMIDE 20 MG PO TABS
20.0000 mg | ORAL_TABLET | Freq: Every day | ORAL | Status: DC
Start: 2020-11-15 — End: 2020-11-15
  Administered 2020-11-15: 10:00:00 20 mg via ORAL
  Filled 2020-11-15: qty 1

## 2020-11-15 MED ORDER — POTASSIUM CHLORIDE CRYS ER 20 MEQ PO TBCR
20.0000 meq | EXTENDED_RELEASE_TABLET | Freq: Once | ORAL | Status: AC
  Administered 2020-11-15: 20 meq via ORAL
  Filled 2020-11-15: qty 1

## 2020-11-15 MED ORDER — INSULIN GLARGINE 100 UNIT/ML ~~LOC~~ SOLN: 12.0000 [IU] | Freq: Every day | SUBCUTANEOUS | 11 refills | Status: DC

## 2020-11-15 NOTE — Discharge Summary (Signed)
Physician Discharge Summary  Heather [name] LFY:[id number] DOB: 1951/02/24 DOA: 11/09/2020  PCP: [name], FNP  Admit date: 11/09/2020 Discharge date: 11/15/2020  Admitted From: Home  Disposition:  Home   Recommendations for Outpatient Follow-up:  1. Follow up with PCP in 1-2 weeks 2. Please obtain BMP/CBC in one week. Needs to follow hb level.  3. Follow up with vascular in 1 month.   Home Health: none  Discharge Condition: Stable.  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  She has had associated vomiting and diarrhea.  She also endorses recent weight loss.  Her pain seems worse after eating.  She was treated in February for diverticulitis with antibiotics.  She continues to smoke.  Due to contrast allergy she underwent CT abdomen pelvis without contrast which showed heavily calcified aorta and branch vessels.  She was a started on IV fluids and admitted for further work-up and monitoring. GI was initially consulted on admission, given her history of atherosclerosis and symptoms that appears consulting with mesenteric ischemia she was recommended for a CTA abdomen and pelvis.  She underwent premedication due to history of iodine allergy and underwent testing.  She was found to have advanced atherosclerotic calcification of the aorta and mesenteric vessels.  Origin of SMA showed focal high-grade narrowing with advanced calcification.  Vascular surgery consult was recommended by GI.  She was evaluated by vascular and was recommended angiography on 10/2010/22.   1-Chronic mesenteric ischemia, stricture of the superior mesenteric  artery: -Presented with abdominal pain, worse after she eats. -CT a abdomen and pelvis showed advanced atherosclerotic calcification of the origins of the SMA with focal high-grade narrowing. -Underwent angiography, balloon expandable stent placement to the superior mesenteric artery using a 6 mm x 16 mm balloon expandable stent. -Continue with aspirin and plavix.  -She denies melena or blood in the stool. Denies abdominal pain. Hb mildly decrease today. Suspect hemodilution from fluids.  -Stable for discharge. Needs to follow up with vascular in 1 month.   2-Syncope: Resolved -Suspect related to hypovolemia, secondary to poor oral intake  3-AKI; Baseline creatinine 0.6.  She was treated with IV fluids.  Monitor renal function post contrast  4-Hypokalemia: Repleted  5-status post TAVR  6-atherosclerosis of native arteries of extremities with intermittent claudication Continue with aspirin and Plavix  Hypertension: Continue with losartan and Toprol  Diabetes: Continue with Lantus and sliding scale insulin  CAD: Continue with aspirin and Lipitor   chronic diastolic heart failure: Compensated    Discharge Diagnoses:  Principal Problem:   Abdominal pain Active Problems:   (HFpEF) heart failure with preserved ejection fraction (HCC)   CAD (coronary artery disease)   Diabetes mellitus ([place])   Hypertension, benign   Atherosclerosis of native arteries of extremity with intermittent claudication (HCC)   S/P TAVR (transcatheter aortic valve replacement)   Hypokalemia   AKI (acute kidney injury) ([place])   Asymptomatic bacteriuria   Malnutrition of moderate degree    Discharge Instructions  Discharge Instructions    Diet - low sodium heart healthy   Complete by: As directed    Increase activity slowly   Complete by: As directed      Allergies as of 11/15/2020      Reactions  Iodinated Diagnostic Agents Hives   Sulfa Antibiotics    Shellfish Allergy Rash,  Other (See Comments)   Scallops specifically cause VOMITING Scallops specifically cause VOMITING      Medication List    STOP taking these medications   cyclobenzaprine 10 MG tablet Commonly known as: FLEXERIL     TAKE these medications   Accu-Chek Aviva Plus test strip Generic drug: glucose blood   Accu-Chek Aviva Plus w/Device Kit   Accu-Chek Aviva Soln   Accu-Chek Softclix Lancets lancets   albuterol 108 (90 Base) MCG/ACT inhaler Commonly known as: VENTOLIN HFA Inhale into the lungs.   aspirin EC 81 MG tablet Take 81 mg by mouth daily.   atorvastatin 80 MG tablet Commonly known as: LIPITOR Take 80 mg by mouth daily.   B-D SINGLE USE SWABS REGULAR Pads   cholecalciferol 1000 units tablet Commonly known as: VITAMIN D Take 1,000 Units by mouth daily. Takes 2000 units daily.   clopidogrel 75 MG tablet Commonly known as: PLAVIX Take 1 tablet (75 mg total) by mouth daily.   donepezil 5 MG tablet Commonly known as: ARICEPT Take 5 mg by mouth at bedtime.   escitalopram 20 MG tablet Commonly known as: Lexapro Take 1 tablet (20 mg total) by mouth daily.   fluticasone 50 MCG/ACT nasal spray Commonly known as: FLONASE   Fluticasone-Salmeterol 250-50 MCG/DOSE Aepb Commonly known as: ADVAIR Inhale 1 puff into the lungs daily.   furosemide 20 MG tablet Commonly known as: LASIX Take 20 mg by mouth daily.   Insulin Aspart FlexPen 100 UNIT/ML Sopn   insulin glargine 100 UNIT/ML injection Commonly known as: LANTUS Inject 0.12 mLs (12 Units total) into the skin at bedtime. What changed: how much to take   insulin lispro 100 UNIT/ML KwikPen Commonly known as: HUMALOG Inject 4-8 Units into the skin in the morning and at bedtime.   Insulin Syringe-Needle U-100 30G X 5/16" 1 ML Misc   losartan 50 MG tablet Commonly known as: COZAAR Take 50 mg by mouth daily.   metoprolol succinate 25 MG 24 hr tablet Commonly known as: TOPROL-XL Take 25 mg by mouth daily.    pregabalin 50 MG capsule Commonly known as: LYRICA Take 50 mg by mouth 3 (three) times daily.   traZODone 50 MG tablet Commonly known as: DESYREL TAKE 1/2 TO 1 TABLET AT BEDTIME AS NEEDED FOR SLEEP   WOMENS MULTI VITAMIN & MINERAL PO Take 1 tablet by mouth every morning.       Follow-up Information    Schnier, Dolores Lory, MD. Go on 12/17/2020.   Specialties: Vascular Surgery, Cardiology, Radiology, Vascular Surgery Why: at 8:00am  will need mesenteric duplex with visit. Contact information: [place] [address] [phone]        [name], [place]. Go on 11/22/2020.   Specialty: Family Medicine Why: at 9:40am Contact information: [address] [place] [address] [phone]              Allergies  Allergen Reactions  . Iodinated Diagnostic Agents Hives  . Sulfa Antibiotics   . Shellfish Allergy Rash and Other (See Comments)    Scallops specifically cause VOMITING Scallops specifically cause VOMITING     Consultations:  Dr Rogue Jury gregory    Procedures/Studies: CT ABDOMEN PELVIS WO CONTRAST  Result Date: 11/09/2020 CLINICAL DATA:  Fall, right side abdominal pain EXAM: CT ABDOMEN AND PELVIS WITHOUT CONTRAST TECHNIQUE: Multidetector CT imaging of the abdomen and pelvis was performed following the standard protocol without  IV contrast. COMPARISON:  10/06/2020 FINDINGS: Lower chest: Coronary artery calcifications and aortic calcifications. No acute abnormality. Hepatobiliary: No focal liver abnormality is seen. Status post cholecystectomy. No biliary dilatation. Pancreas: No focal abnormality or ductal dilatation. Spleen: No focal abnormality.  Normal size. Adrenals/Urinary Tract: No adrenal abnormality. No focal renal abnormality. No stones or hydronephrosis. Urinary bladder is unremarkable. Stomach/Bowel: Normal appendix. Stomach, large and small bowel grossly unremarkable. Vascular/Lymphatic: Heavily calcified aorta and iliac vessels. No  evidence of aneurysm or adenopathy. Reproductive: Uterus and adnexa unremarkable.  No mass. Other: No free fluid or free air. Musculoskeletal: No acute bony abnormality. IMPRESSION: No evidence of solid organ injury. Heavily calcified aorta and branch vessels. Sigmoid diverticulosis. Electronically Signed   By: Rolm Baptise M.D.   On: 11/09/2020 19:16   DG Chest 2 View  Result Date: 11/09/2020 CLINICAL DATA:  Chest pain after fall EXAM: CHEST - 2 VIEW COMPARISON:  March 09, 2012 FINDINGS: The heart size and mediastinal contours are within normal limits. Both lungs are clear. The visualized skeletal structures are unremarkable. IMPRESSION: No active cardiopulmonary disease. Electronically Signed   By: Dorise Bullion III M.D   On: 11/09/2020 19:24   PERIPHERAL VASCULAR CATHETERIZATION  Result Date: 11/13/2020 See Op Note  CT Angio Abd/Pel w/ and/or w/o  Result Date: 11/11/2020 CLINICAL DATA:  70 year old female with concern for mesenteric ischemia. Abdominal pain. EXAM: CTA ABDOMEN AND PELVIS WITHOUT AND WITH CONTRAST TECHNIQUE: Multidetector CT imaging of the abdomen and pelvis was performed using the standard protocol during bolus administration of intravenous contrast. Multiplanar reconstructed images and MIPs were obtained and reviewed to evaluate the vascular anatomy. CONTRAST:  154m OMNIPAQUE IOHEXOL 350 MG/ML SOLN COMPARISON:  CT abdomen pelvis dated 11/09/2020. FINDINGS: VASCULAR Aorta: Advanced atherosclerotic calcification of the abdominal aorta. No aneurysmal dilatation or dissection. No periaortic fluid collection or hematoma. Celiac: Atherosclerotic calcification of the origin of the celiac axis. The celiac artery and its major branches remain patent. There is atherosclerotic calcification of the mesenteric vessels. SMA: Advanced atherosclerotic calcification of the origin of the SMA with focal high-grade narrowing. There is mild dilatation of the SMA measuring up to 6 mm in diameter, likely  poststenotic dilatation. The SMA is patent. Renals: Atherosclerotic calcification of the origins of the renal arteries. The renal arteries remain patent. 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 IMA: Atherosclerotic calcification of the proximal IMA. The IMA is patent. Inflow: Advanced atherosclerotic calcification of the iliac arteries. The iliac arteries remain patent. Proximal Outflow: Advanced atherosclerotic calcification with narrowing or strictured vessels. Partially visualized stent in the proximal left superficial femoral artery. The proximal outflow arteries appear patent. Veins: The IVC is unremarkable. The SMV, splenic vein, and main portal vein are patent. No portal venous gas. Review of the MIP images confirms the above findings. NON-VASCULAR Lower chest: Bibasilar linear atelectasis/scarring. The visualized lung bases are otherwise clear. No intra-abdominal free air or free fluid. Hepatobiliary: The liver is unremarkable. There is mild intrahepatic biliary ductal dilatation, likely post cholecystectomy. No retained calcified stone noted in the central CBD. Pancreas: Unremarkable. No pancreatic ductal dilatation or surrounding inflammatory changes. Spleen: Normal in size without focal abnormality. Adrenals/Urinary Tract: The adrenal glands unremarkable. There is no hydronephrosis on either side. The visualized ureters and urinary bladder appear unremarkable. Stomach/Bowel: There is sigmoid diverticulosis with muscular hypertrophy. No active inflammatory changes. There are scattered colonic diverticula. There is no bowel obstruction or active inflammation. There is a 3.5 cm duodenal diverticulum without active inflammation. The appendix  is normal. Lymphatic: No adenopathy. Reproductive: The uterus is poorly visualized.  No adnexal masses. Other: None Musculoskeletal: Degenerative changes of the spine. Old lower thoracic compression fracture. No acute osseous pathology. IMPRESSION: 1. No acute  intra-abdominal or pelvic pathology. No CT evidence of mesenteric ischemia. 2. Advanced atherosclerotic calcification of the aorta and mesenteric vessels as described. 3. Colonic diverticulosis. No bowel obstruction. Normal appendix. Electronically Signed   By: Anner Crete M.D.   On: 11/11/2020 00:26      Subjective: Denies abdominal pain, blood in the stool   Discharge Exam: Vitals:   11/15/20 0348 11/15/20 0833  BP: (!) 121/53 (!) 147/62  Pulse: (!) 59 67  Resp: 16 18  Temp: 98.5 F (36.9 C) 98.8 F (37.1 C)  SpO2: 97% 96%     General: Pt is alert, awake, not in acute distress Cardiovascular: RRR, S1/S2 +, no rubs, no gallops Respiratory: CTA bilaterally, no wheezing, no rhonchi Abdominal: Soft, NT, ND, bowel sounds + Extremities: no edema, no cyanosis    The results of significant diagnostics from this hospitalization (including imaging, microbiology, ancillary and laboratory) are listed below for reference.     Microbiology: Recent Results (from the past 240 hour(s))  Urine culture     Status: Abnormal   Collection Time: 11/09/20  4:52 PM   Specimen: Urine, Random  Result Value Ref Range Status   Specimen Description   Final    URINE, RANDOM Performed at [facility], [address] [place], [place] [address]    Special Requests   Final    NONE Performed at [facility], [address] [place], [place] [address]    Culture MULTIPLE SPECIES PRESENT, SUGGEST RECOLLECTION (A)  Final   Report Status 11/11/2020 FINAL  Final  SARS CORONAVIRUS 2 (TAT 6-24 HRS) Nasopharyngeal Nasopharyngeal Swab     Status: None   Collection Time: 11/10/20  3:07 AM   Specimen: Nasopharyngeal Swab  Result Value Ref Range Status   SARS Coronavirus 2 NEGATIVE NEGATIVE Final    Comment: (NOTE) SARS-CoV-2 target nucleic acids are NOT DETECTED.  The SARS-CoV-2 RNA is generally detectable in upper and lower respiratory specimens during the acute phase of  infection. Negative results do not preclude SARS-CoV-2 infection, do not rule out co-infections with other pathogens, and should not be used as the sole basis for treatment or other patient management decisions. Negative results must be combined with clinical observations, patient history, and epidemiological information. The expected result is Negative.  Fact Sheet for Patients: SugarRoll.be  Fact Sheet for Healthcare Providers: https://www.woods-mathews.com/  This test is not yet approved or cleared by the [place] FDA and  has been authorized for detection and/or diagnosis of SARS-CoV-2 by FDA under an Emergency Use Authorization (EUA). This EUA will remain  in effect (meaning this test can be used) for the duration of the COVID-19 declaration under Se ction 564(b)(1) of the Act, 21 U.S.C. section 360bbb-3(b)(1), unless the authorization is terminated or revoked sooner.  Performed at [facility], [place] [address], [place], [place] [address]      Labs: BNP (last 3 results) No results for input(s): BNP in the last 8760 hours. Basic Metabolic Panel: Recent Labs  Lab 11/11/20 0430 11/12/20 0618 11/13/20 0508 11/14/20 0543 11/15/20 0539  NA 137 140 140 138 141  K 4.8 4.9 4.0 4.4 3.8  CL 113* 112* 115* 114* 115*  CO2 18* 23 22 20* 23  GLUCOSE 309* 57* 105* 325* 182*  BUN _0 CREATININE 0.91 0.81 0.77 0.72 0.67  CALCIUM 8.0* 8.5* 8.1* 8.1* 8.3*  MG 1.2* 2.6* 2.3 1.9 1.9   Liver Function Tests: Recent Labs  Lab 11/09/20 1651  AST 14*  ALT 9  ALKPHOS 78  BILITOT 0.5  PROT 7.1  ALBUMIN 3.4*   Recent Labs  Lab 11/09/20 1651  LIPASE 26   No results for input(s): AMMONIA in the last 168 hours. CBC: Recent Labs  Lab 11/11/20 0430 11/12/20 0618 11/13/20 0508 11/14/20 0543 11/15/20 0539  WBC 11.2* 17.3* 9.2 7.8 8.6  NEUTROABS 10.4* 13.9* 6.3 7.0 5.4  HGB 10.9* 11.0* 9.6* 9.6* 8.5*  HCT 33.1*  34.0* 30.3* 29.7* 26.5*  MCV 93.5 92.6 93.5 92.0 92.0  PLT 445* 475* 398 401* 324   Cardiac Enzymes: No results for input(s): CKTOTAL, CKMB, CKMBINDEX, TROPONINI in the last 168 hours. BNP: Invalid input(s): POCBNP CBG: Recent Labs  Lab 11/14/20 1314 11/14/20 1551 11/14/20 1732 11/14/20 2037 11/15/20 0803  GLUCAP 222* 198* 162* 214* 168*   D-Dimer No results for input(s): DDIMER in the last 72 hours. Hgb A1c No results for input(s): HGBA1C in the last 72 hours. Lipid Profile No results for input(s): CHOL, HDL, LDLCALC, TRIG, CHOLHDL, LDLDIRECT in the last 72 hours. Thyroid function studies No results for input(s): TSH, T4TOTAL, T3FREE, THYROIDAB in the last 72 hours.  Invalid input(s): FREET3 Anemia work up No results for input(s): VITAMINB12, FOLATE, FERRITIN, TIBC, IRON, RETICCTPCT in the last 72 hours. Urinalysis    Component Value Date/Time   COLORURINE YELLOW (A) 11/09/2020 1652   APPEARANCEUR CLOUDY (A) 11/09/2020 1652   APPEARANCEUR Clear 06/20/2012 2127   LABSPEC 1.017 11/09/2020 1652   LABSPEC 1.022 06/20/2012 2127   PHURINE 5.0 11/09/2020 1652   GLUCOSEU NEGATIVE 11/09/2020 1652   GLUCOSEU >=500 06/20/2012 2127   HGBUR NEGATIVE 11/09/2020 1652   BILIRUBINUR NEGATIVE 11/09/2020 1652   BILIRUBINUR Negative 06/20/2012 2127   KETONESUR NEGATIVE 11/09/2020 1652   PROTEINUR NEGATIVE 11/09/2020 1652   NITRITE NEGATIVE 11/09/2020 1652   LEUKOCYTESUR MODERATE (A) 11/09/2020 1652   LEUKOCYTESUR Negative 06/20/2012 2127   Sepsis Labs Invalid input(s): PROCALCITONIN,  WBC,  LACTICIDVEN Microbiology Recent Results (from the past 240 hour(s))  Urine culture     Status: Abnormal   Collection Time: 11/09/20  4:52 PM   Specimen: Urine, Random  Result Value Ref Range Status   Specimen Description   Final    URINE, RANDOM Performed at [facility], [address], [place], [place] [address]    Special Requests   Final    NONE Performed at [facility], [address] [place], [place] [address]    Culture MULTIPLE SPECIES PRESENT, SUGGEST RECOLLECTION (A)  Final   Report Status 11/11/2020 FINAL  Final  SARS CORONAVIRUS 2 (TAT 6-24 HRS) Nasopharyngeal Nasopharyngeal Swab     Status: None   Collection Time: 11/10/20  3:07 AM   Specimen: Nasopharyngeal Swab  Result Value Ref Range Status   SARS Coronavirus 2 NEGATIVE NEGATIVE Final    Comment: (NOTE) SARS-CoV-2 target nucleic acids are NOT DETECTED.  The SARS-CoV-2 RNA is generally detectable in upper and lower respiratory specimens during the acute phase of infection. Negative results do not preclude SARS-CoV-2 infection, do not rule out co-infections with other pathogens, and should not be used as the sole basis for treatment or other patient management decisions. Negative results must be combined with clinical observations, patient history, and epidemiological information.  The expected result is Negative.  Fact Sheet for Patients: SugarRoll.be  Fact Sheet for Healthcare Providers: https://www.woods-mathews.com/  This test is not yet approved or cleared by the [place] FDA and  has been authorized for detection and/or diagnosis of SARS-CoV-2 by FDA under an Emergency Use Authorization (EUA). This EUA will remain  in effect (meaning this test can be used) for the duration of the COVID-19 declaration under Se ction 564(b)(1) of the Act, 21 U.S.C. section 360bbb-3(b)(1), unless the authorization is terminated or revoked sooner.  Performed at [facility], [place] [address] [place], [place] [address]      Time coordinating discharge: 40 minutes  SIGNED:   Elmarie Shiley, MD  [facility]

## 2020-11-15 NOTE — TOC Transition Note (Signed)
Transition of Care [facility]) - CM/SW Discharge Note   Patient Details  Name: Heather [name] MRN: [id number] Date of Birth: 1950/10/27  Transition of Care [facility]) CM/SW Contact:  Shelbie Hutching, RN Phone Number: 11/15/2020, 9:49 AM   Clinical Narrative:    Patient medically cleared for discharge home with home health services through [facility] for RN, PT, and OT.  [place] with [facility] notified of discharge for today.  Patient's husband will transport her home.  Patient verifies that she has a walker at home.    Final next level of care: [place] Barriers to Discharge: Barriers Resolved   Patient Goals and CMS Choice Patient states their goals for this hospitalization and ongoing recovery are:: Wants to have something to eat and know when her procedure is going to be CMS Medicare.gov Compare Post Acute Care list provided to:: Patient Choice offered to / list presented to : Patient  Discharge Placement                       Discharge Plan and Services   Discharge Planning Services: CM Consult Post Acute Care Choice: Home Health          DME Arranged: N/A DME Agency: NA       HH Arranged: RN,PT,OT [place] Agency: [facility] Date [place] Agency Contacted: 11/15/20 Time [place]: [phone] Representative spoke with at [place]: [place] ([place]) Interventions     Readmission Risk Interventions Readmission Risk Prevention Plan 11/13/2020  Transportation Screening Complete  PCP or Specialist Appt within 3-5 Days Complete  HRI or [place] Complete  Social Work Consult for [place] Planning/Counseling Complete  Palliative Care Screening Not Applicable  Medication Review Press photographer) Complete  Some recent data might be hidden

## 2020-11-15 NOTE — Plan of Care (Signed)

## 2020-11-15 NOTE — Progress Notes (Signed)
Writer went over with discharge instruction with patient, verbalizes understanding. Patient left POV with her husband.

## 2020-12-12 ENCOUNTER — Other Ambulatory Visit (INDEPENDENT_AMBULATORY_CARE_PROVIDER_SITE_OTHER): Payer: Self-pay | Admitting: Vascular Surgery

## 2020-12-12 DIAGNOSIS — K551 Chronic vascular disorders of intestine: Secondary | ICD-10-CM

## 2020-12-12 DIAGNOSIS — Z95820 Peripheral vascular angioplasty status with implants and grafts: Secondary | ICD-10-CM

## 2020-12-17 ENCOUNTER — Other Ambulatory Visit: Payer: Self-pay

## 2020-12-17 ENCOUNTER — Encounter (INDEPENDENT_AMBULATORY_CARE_PROVIDER_SITE_OTHER): Payer: Self-pay | Admitting: Nurse Practitioner

## 2020-12-17 ENCOUNTER — Ambulatory Visit (INDEPENDENT_AMBULATORY_CARE_PROVIDER_SITE_OTHER): Payer: Medicare HMO

## 2020-12-17 ENCOUNTER — Ambulatory Visit (INDEPENDENT_AMBULATORY_CARE_PROVIDER_SITE_OTHER): Payer: Medicare HMO | Admitting: Nurse Practitioner

## 2020-12-17 VITALS — BP 178/101 | HR 62 | Ht 59.0 in | Wt 104.0 lb

## 2020-12-17 DIAGNOSIS — Z794 Long term (current) use of insulin: Secondary | ICD-10-CM

## 2020-12-17 DIAGNOSIS — E782 Mixed hyperlipidemia: Secondary | ICD-10-CM

## 2020-12-17 DIAGNOSIS — Z95820 Peripheral vascular angioplasty status with implants and grafts: Secondary | ICD-10-CM

## 2020-12-17 DIAGNOSIS — E1165 Type 2 diabetes mellitus with hyperglycemia: Secondary | ICD-10-CM | POA: Diagnosis not present

## 2020-12-17 DIAGNOSIS — K551 Chronic vascular disorders of intestine: Secondary | ICD-10-CM

## 2020-12-17 DIAGNOSIS — I6523 Occlusion and stenosis of bilateral carotid arteries: Secondary | ICD-10-CM | POA: Diagnosis not present

## 2020-12-17 DIAGNOSIS — IMO0002 Reserved for concepts with insufficient information to code with codable children: Secondary | ICD-10-CM

## 2020-12-17 NOTE — Progress Notes (Signed)
MRN : [id number]  Heather [name] is a 70 y.o. (03/23/1951) female who presents with chief complaint of  Chief Complaint  Patient presents with  . Follow-up    U/S follow up  .  History of Present Illness:   The patient returns to the office for followup and review of the noninvasive studies.   Angiogram performed on 11/13/2020: She is s/p balloon expandable stent placement to the superior mesenteric artery using a 6 mm diameter x 16 mm length lifestream balloon expandable stent postdilated to 7 mm  The patient notes that her abdominal pain has largely stopped.  She also has not had any further nausea or vomiting.  The patient has had momentary episodes of diarrhea but nothing like she had previously.  The patient's only complaint is some slight dizziness that she has had for the last 2 days. There have been no significant changes to the patient's overall health care.  The patient denies amaurosis fugax or recent TIA symptoms. There are no recent neurological changes noted. The patient denies history of DVT, PE or superficial thrombophlebitis. The patient denies recent episodes of angina or shortness of breath.   Duplex ultrasound of the mesenteric arteries reveal a patent SMA stent with velocities up to 271 suggesting a 60% stenosis. Current Meds  Medication Sig  . ACCU-CHEK AVIVA PLUS test strip   . Accu-Chek Softclix Lancets lancets   . albuterol (VENTOLIN HFA) 108 (90 Base) MCG/ACT inhaler Inhale into the lungs.  . Alcohol Swabs (B-D SINGLE USE SWABS REGULAR) PADS   . aspirin EC 81 MG tablet Take 81 mg by mouth daily.  [name] atorvastatin (LIPITOR) 80 MG tablet Take 80 mg by mouth daily.  . Blood Glucose Calibration (ACCU-CHEK AVIVA) SOLN   . Blood Glucose Monitoring Suppl (ACCU-CHEK AVIVA PLUS) w/Device KIT   . cholecalciferol (VITAMIN D) 1000 units tablet Take 1,000 Units by mouth daily. Takes 2000 units daily.  . clopidogrel (PLAVIX) 75 MG tablet Take 1 tablet (75 mg total) by  mouth daily.  [name] donepezil (ARICEPT) 5 MG tablet Take 5 mg by mouth at bedtime.  [name] escitalopram (LEXAPRO) 20 MG tablet Take 1 tablet (20 mg total) by mouth daily.  . fluticasone (FLONASE) 50 MCG/ACT nasal spray   . Fluticasone-Salmeterol (ADVAIR) 250-50 MCG/DOSE AEPB Inhale 1 puff into the lungs daily.  . furosemide (LASIX) 20 MG tablet Take 20 mg by mouth daily.  . Insulin Aspart FlexPen 100 UNIT/ML SOPN   . insulin glargine (LANTUS) 100 UNIT/ML injection Inject 0.12 mLs (12 Units total) into the skin at bedtime.  . insulin lispro (HUMALOG) 100 UNIT/ML KwikPen Inject 4-8 Units into the skin in the morning and at bedtime.  . Insulin Syringe-Needle U-100 30G X 5/16" 1 ML MISC   . LEVEMIR FLEXTOUCH 100 UNIT/ML FlexPen INJECT 8 UNITS UNDER THE SKIN AT BEDTIME  . losartan (COZAAR) 50 MG tablet Take 50 mg by mouth daily.  . metoprolol succinate (TOPROL-XL) 25 MG 24 hr tablet Take 25 mg by mouth daily.  . Multiple Vitamins-Minerals (WOMENS MULTI VITAMIN & MINERAL PO) Take 1 tablet by mouth every morning.  . pregabalin (LYRICA) 50 MG capsule Take 50 mg by mouth 3 (three) times daily.  . traZODone (DESYREL) 50 MG tablet TAKE 1/2 TO 1 TABLET AT BEDTIME AS NEEDED FOR SLEEP    Past Medical History:  Diagnosis Date  . Anxiety   . Bipolar disorder ([place])   . Cancer ([place])    skin  . Depression   .  Diabetes mellitus   . Diabetes mellitus, type II ([place])   . History of artificial heart valve   . Schizophrenia ([place])   . Vertigo     Past Surgical History:  Procedure Laterality Date  . ABDOMINAL HYSTERECTOMY    . BACK SURGERY    . CARDIAC VALVE REPLACEMENT    . CHOLECYSTECTOMY    . HEMORRHOID SURGERY    . TUBAL LIGATION    . VISCERAL ANGIOGRAPHY N/A 11/13/2020   Procedure: VISCERAL ANGIOGRAPHY;  Surgeon: [name], MD;  Location: [facility];  Service: Cardiovascular;  Laterality: N/A;    Social History Social History   Tobacco Use  . Smoking status: Current Every Day  Smoker    Packs/day: 1.50    Types: Cigarettes  . Smokeless tobacco: Never Used  Vaping Use  . Vaping Use: Never used  Substance Use Topics  . Alcohol use: Yes    Alcohol/week: 1.0 - 2.0 standard drink    Types: 1 - 2 Cans of beer per week  . Drug use: Not Currently    Family History Family History  Problem Relation Age of Onset  . Mental illness Mother   . Breast cancer Neg Hx     Allergies  Allergen Reactions  . Iodinated Diagnostic Agents Hives  . Sulfa Antibiotics   . Shellfish Allergy Rash and Other (See Comments)    Scallops specifically cause VOMITING Scallops specifically cause VOMITING      REVIEW OF SYSTEMS (Negative unless checked)  Constitutional: '[]' Weight loss  '[]' Fever  '[]' Chills Cardiac: '[]' Chest pain   '[]' Chest pressure   '[]' Palpitations   '[]' Shortness of breath when laying flat   '[]' Shortness of breath with exertion. Vascular:  '[]' Pain in legs with walking   '[]' Pain in legs at rest  '[]' History of DVT   '[]' Phlebitis   '[]' Swelling in legs   '[]' Varicose veins   '[]' Non-healing ulcers Pulmonary:   '[]' Uses home oxygen   '[]' Productive cough   '[]' Hemoptysis   '[]' Wheeze  '[]' COPD   '[]' Asthma Neurologic:  '[x]' Dizziness   '[]' Seizures   '[]' History of stroke   '[]' History of TIA  '[]' Aphasia   '[]' Vissual changes   '[]' Weakness or numbness in arm   '[]' Weakness or numbness in leg Musculoskeletal:   '[]' Joint swelling   '[]' Joint pain   '[]' Low back pain Hematologic:  '[]' Easy bruising  '[]' Easy bleeding   '[]' Hypercoagulable state   '[]' Anemic Gastrointestinal:  '[]' Diarrhea   '[]' Vomiting  '[]' Gastroesophageal reflux/heartburn   '[]' Difficulty swallowing. Genitourinary:  '[]' Chronic kidney disease   '[]' Difficult urination  '[]' Frequent urination   '[]' Blood in urine Skin:  '[]' Rashes   '[]' Ulcers  Psychological:  '[]' History of anxiety   '[]'  History of major depression.  Physical Examination  Vitals:   12/17/20 0848  BP: (!) 178/101  Pulse: 62  Weight: 104 lb (47.2 kg)  Height: '4\' 11"'  (1.499 m)   Body mass index is 21.01  kg/m. Gen: WD/WN, NAD Head: [place]/AT, No temporalis wasting.  Ear/Nose/Throat: Hearing grossly intact, nares w/o erythema or drainage Eyes: PER, EOMI, sclera nonicteric.  Neck: Supple, no large masses.   Pulmonary:  Good air movement, no audible wheezing bilaterally, no use of accessory muscles.  Cardiac: RRR, no JVD, murmur Vascular: Bilateral bruit Vessel Right Left  Radial Palpable Palpable  Gastrointestinal: Non-distended. No guarding/no peritoneal signs.  Musculoskeletal: M/S 5/5 throughout.  No deformity or atrophy.  Neurologic: CN 2-12 intact. Symmetrical.  Speech is fluent. Motor exam as listed above. Psychiatric: Judgment intact, Mood & affect appropriate for pt's clinical  situation. Dermatologic: No rashes or ulcers noted.  No changes consistent with cellulitis. Lymph : No lichenification or skin changes of chronic lymphedema.  CBC Lab Results  Component Value Date   WBC 8.6 11/15/2020   HGB 8.5 (L) 11/15/2020   HCT 26.5 (L) 11/15/2020   MCV 92.0 11/15/2020   PLT 324 11/15/2020    BMET    Component Value Date/Time   NA 141 11/15/2020 0539   NA 135 (L) 06/20/2012 2127   K 3.8 11/15/2020 0539   K 4.0 06/20/2012 2127   CL 115 (H) 11/15/2020 0539   CL 100 06/20/2012 2127   CO2 23 11/15/2020 0539   CO2 27 06/20/2012 2127   GLUCOSE 182 (H) 11/15/2020 0539   GLUCOSE 250 (H) 06/20/2012 2127   BUN 11 11/15/2020 0539   BUN 20 (H) 06/20/2012 2127   CREATININE 0.67 11/15/2020 0539   CREATININE 0.60 06/20/2012 2127   CALCIUM 8.3 (L) 11/15/2020 0539   CALCIUM 8.8 06/20/2012 2127   GFRNONAA >60 11/15/2020 0539   GFRNONAA >60 06/20/2012 2127   GFRAA >60 06/20/2012 2127   CrCl cannot be calculated (Patient's most recent lab result is older than the maximum 21 days allowed.).  COAG No results found for: INR, PROTIME  Radiology No results found.    Assessment/Plan 1. Chronic mesenteric ischemia (HCC) Recommend:  The patient is status post successful angiogram  with intervention of the mesenteric vessels.  Stenting of the SMA was performed.  The patient reports that the abdominal pain is improved and the post prandial symptoms are essentially gone.   The patient denies lifestyle limiting changes at this point in time.  No further invasive studies, angiography or surgery at this time The patient should continue walking and begin a more formal exercise program.  The patient should continue antiplatelet therapy and aggressive treatment of the lipid abnormalities  Smoking cessation was again discussed  Patient should undergo noninvasive studies as ordered. The patient will follow up with me after the studies.    2. Bilateral carotid artery stenosis The patient does have dizziness today.  However this is likely not related to her recent angiogram.  Patient does have bilateral bruits therefore we will reevaluate her carotid when she returns for noninvasive studies.  In the interim the patient is advised to discuss dizziness with PCP if it continues.  3. Uncontrolled type 2 diabetes mellitus with insulin therapy ([place]) Continue hypoglycemic medications as already ordered, these medications have been reviewed and there are no changes at this time.  Hgb A1C to be monitored as already arranged by primary service   4. Mixed hyperlipidemia Continue statin as ordered and reviewed, no changes at this time   Kris Hartmann, NP  12/17/2020 9:35 AM

## 2021-03-06 ENCOUNTER — Other Ambulatory Visit: Payer: Self-pay | Admitting: Psychiatry

## 2021-03-06 DIAGNOSIS — F5105 Insomnia due to other mental disorder: Secondary | ICD-10-CM

## 2021-03-15 ENCOUNTER — Other Ambulatory Visit (INDEPENDENT_AMBULATORY_CARE_PROVIDER_SITE_OTHER): Payer: Self-pay | Admitting: Nurse Practitioner

## 2021-03-15 DIAGNOSIS — K551 Chronic vascular disorders of intestine: Secondary | ICD-10-CM

## 2021-03-15 DIAGNOSIS — I6523 Occlusion and stenosis of bilateral carotid arteries: Secondary | ICD-10-CM

## 2021-03-15 DIAGNOSIS — Z95820 Peripheral vascular angioplasty status with implants and grafts: Secondary | ICD-10-CM

## 2021-03-18 ENCOUNTER — Ambulatory Visit (INDEPENDENT_AMBULATORY_CARE_PROVIDER_SITE_OTHER): Payer: Medicare HMO

## 2021-03-18 ENCOUNTER — Ambulatory Visit (INDEPENDENT_AMBULATORY_CARE_PROVIDER_SITE_OTHER): Payer: Medicare HMO | Admitting: Nurse Practitioner

## 2021-03-18 ENCOUNTER — Other Ambulatory Visit: Payer: Self-pay

## 2021-03-18 ENCOUNTER — Encounter (INDEPENDENT_AMBULATORY_CARE_PROVIDER_SITE_OTHER): Payer: Self-pay | Admitting: Nurse Practitioner

## 2021-03-18 VITALS — BP 170/90 | HR 57 | Resp 16 | Wt 113.8 lb

## 2021-03-18 DIAGNOSIS — I6523 Occlusion and stenosis of bilateral carotid arteries: Secondary | ICD-10-CM

## 2021-03-18 DIAGNOSIS — K551 Chronic vascular disorders of intestine: Secondary | ICD-10-CM | POA: Diagnosis not present

## 2021-03-18 DIAGNOSIS — Z95820 Peripheral vascular angioplasty status with implants and grafts: Secondary | ICD-10-CM | POA: Diagnosis not present

## 2021-03-18 DIAGNOSIS — E782 Mixed hyperlipidemia: Secondary | ICD-10-CM

## 2021-03-18 DIAGNOSIS — F17200 Nicotine dependence, unspecified, uncomplicated: Secondary | ICD-10-CM | POA: Diagnosis not present

## 2021-03-18 NOTE — Progress Notes (Signed)
Subjective:    Patient ID: Heather [name], female    DOB: 05-26-1951, 70 y.o.   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  The patient denies abdominal pain or postprandial symptoms.  The patient denies weight loss as well as nausea.  The patient does not substantiate food fear, particular foods do not seem to aggravate or alleviate the symptoms.  The patient denies bloody bowel movements or diarrhea.    The patient had stenting to her SMA on 11/13/2020.  She has some dizziness post angiogram.  That has resolved as well.  Overall she is doing much better.  The patient denies amaurosis fugax or recent TIA symptoms. There are no recent neurological changes noted. The patient denies claudication symptoms or rest pain symptoms. The patient denies history of DVT, PE or superficial thrombophlebitis. The patient denies recent episodes of angina    .  70-99% stenosis of the superior mesenteric artery.  It is also noted that the hepatic and splenic artery velocities are also increased compared to the prior study.  Her largest aortic diameter is 2.2 cm  Today noninvasive studies show right ICA of 1 to 39% stenosis with left 40 to 59% stenosis.  The bilateral vertebral arteries demonstrate antegrade flow with normal flow hemodynamics seen in the bilateral subclavian arteries.   Review of Systems  Gastrointestinal:  Negative for abdominal pain, diarrhea, nausea and vomiting.  Neurological:  Negative for dizziness.  All other systems reviewed and are negative.     Objective:   Physical Exam Vitals reviewed.  HENT:     Head: Normocephalic.  Cardiovascular:     Rate and Rhythm: Normal rate.     Pulses: Normal pulses.  Pulmonary:     Effort: Pulmonary effort is normal.   Skin:    General: Skin is warm and dry.  Neurological:     Mental Status: She is alert and oriented to person, place, and time.  Psychiatric:        Mood and Affect: Mood normal.        Behavior: Behavior normal.        Thought Content: Thought content normal.        Judgment: Judgment normal.    BP (!) 170/90 (BP Location: Right Arm)   Pulse (!) 57   Resp 16   Wt 113 lb 12.8 oz (51.6 kg)   BMI 22.98 kg/m   Past Medical History:  Diagnosis Date   Anxiety    Bipolar disorder ([place])    Cancer ([place])    skin   Depression    Diabetes mellitus    Diabetes mellitus, type II ([place])    History of artificial heart valve    Schizophrenia ([place])    Vertigo     Social History   Socioeconomic History   Marital status: Married    Spouse name: rowland   Number of children: 2   Years of education: Not on file   Highest education level: High school graduate  Occupational History   Not on file  Tobacco Use   Smoking status: Every Day    Packs/day: 1.50    Types: Cigarettes   Smokeless tobacco: Never  Vaping Use   Vaping Use: Never used  Substance and Sexual Activity  Alcohol use: Yes    Alcohol/week: 1.0 - 2.0 standard drink    Types: 1 - 2 Cans of beer per week   Drug use: Not Currently   Sexual activity: Not Currently  Other Topics Concern   Not on file  Social History Narrative   Not on file   Social Determinants of Health   Financial Resource Strain: Not on file  Food Insecurity: Not on file  Transportation Needs: Not on file  Physical Activity: Not on file  Stress: Not on file  Social Connections: Not on file  Intimate Partner Violence: Not on file    Past Surgical History:  Procedure Laterality Date   [place] N/A 11/13/2020   Procedure: VISCERAL ANGIOGRAPHY;  Surgeon: [name], MD;  Location:  [facility];  Service: Cardiovascular;  Laterality: N/A;    Family History  Problem Relation Age of Onset   Mental illness Mother    Breast cancer Neg Hx     Allergies  Allergen Reactions   Iodinated Diagnostic Agents Hives   Sulfa Antibiotics    Shellfish Allergy Rash and Other (See Comments)    Scallops specifically cause VOMITING Scallops specifically cause VOMITING     CBC Latest Ref Rng & Units 11/15/2020 11/14/2020 11/13/2020  WBC 4.0 - 10.5 K/uL 8.6 7.8 9.2  Hemoglobin 12.0 - 15.0 g/dL 8.5(L) 9.6(L) 9.6(L)  Hematocrit 36.0 - 46.0 % 26.5(L) 29.7(L) 30.3(L)  Platelets 150 - 400 K/uL 324 401(H) 398      CMP     Component Value Date/Time   NA 141 11/15/2020 0539   NA 135 (L) 06/20/2012 2127   K 3.8 11/15/2020 0539   K 4.0 06/20/2012 2127   CL 115 (H) 11/15/2020 0539   CL 100 06/20/2012 2127   CO2 23 11/15/2020 0539   CO2 27 06/20/2012 2127   GLUCOSE 182 (H) 11/15/2020 0539   GLUCOSE 250 (H) 06/20/2012 2127   BUN 11 11/15/2020 0539   BUN 20 (H) 06/20/2012 2127   CREATININE 0.67 11/15/2020 0539   CREATININE 0.60 06/20/2012 2127   CALCIUM 8.3 (L) 11/15/2020 0539   CALCIUM 8.8 06/20/2012 2127   PROT 7.1 11/09/2020 1651   PROT 6.7 06/20/2012 2127   ALBUMIN 3.4 (L) 11/09/2020 1651   ALBUMIN 3.5 06/20/2012 2127   AST 14 (L) 11/09/2020 1651   AST 17 06/20/2012 2127   ALT 9 11/09/2020 1651   ALT 27 06/20/2012 2127   ALKPHOS 78 11/09/2020 1651   ALKPHOS 127 06/20/2012 2127   BILITOT 0.5 11/09/2020 1651   BILITOT 0.3 06/20/2012 2127   GFRNONAA >60 11/15/2020 0539   GFRNONAA >60 06/20/2012 2127   GFRAA >60 06/20/2012 2127     No results found.     Assessment & Plan:   1. Chronic mesenteric ischemia (HCC) Recommend:  The patient has evidence of chronic asymptomatic mesenteric atherosclerosis.  The patient denies lifestyle limiting changes at this point in time.  Given the lack of symptoms no intervention is warranted at this time.   No further  invasive studies, angiography or surgery at this time  The patient should continue walking and begin a more formal exercise program.   The patient should continue antiplatelet therapy and aggressive treatment of the lipid abnormalities  Patient should undergo  noninvasive studies as ordered. The patient will follow up with me after the studies.    Patient will follow up in 6 months.  2. Bilateral carotid artery stenosis Recommend:  Given the patient's asymptomatic subcritical stenosis no further invasive testing or surgery at this time.  Duplex ultrasound shows 1 to 39% stenosis of the right ICA with 40 to 59% stenosis of the left ICA  Continue antiplatelet therapy as prescribed Continue management of CAD, HTN and Hyperlipidemia Healthy heart diet,  encouraged exercise at least 4 times per week Follow up in 12 months with duplex ultrasound and physical exam    3. Mixed hyperlipidemia Continue statin as ordered and reviewed, no changes at this time   4. Tobacco use disorder Smoking cessation was discussed, 3-10 minutes spent on this topic specifically    Current Outpatient Medications on File Prior to Visit  Medication Sig Dispense Refill   ACCU-CHEK AVIVA PLUS test strip      Accu-Chek Softclix Lancets lancets      albuterol (VENTOLIN HFA) 108 (90 Base) MCG/ACT inhaler Inhale into the lungs.     Alcohol Swabs (B-D SINGLE USE SWABS REGULAR) PADS      aspirin EC 81 MG tablet Take 81 mg by mouth daily.     atorvastatin (LIPITOR) 80 MG tablet Take 80 mg by mouth daily.     Blood Glucose Calibration (ACCU-CHEK AVIVA) SOLN      Blood Glucose Monitoring Suppl (ACCU-CHEK AVIVA PLUS) w/Device KIT      cholecalciferol (VITAMIN D) 1000 units tablet Take 1,000 Units by mouth daily. Takes 2000 units daily.     clopidogrel (PLAVIX) 75 MG tablet Take 1 tablet (75 mg total) by mouth daily. 30 tablet 6   donepezil (ARICEPT) 5 MG tablet Take 5 mg by mouth at bedtime.     escitalopram  (LEXAPRO) 20 MG tablet Take 1 tablet (20 mg total) by mouth daily. 90 tablet 0   fluticasone (FLONASE) 50 MCG/ACT nasal spray      Fluticasone-Salmeterol (ADVAIR) 250-50 MCG/DOSE AEPB Inhale 1 puff into the lungs daily.     furosemide (LASIX) 20 MG tablet Take 20 mg by mouth daily.     Insulin Aspart FlexPen 100 UNIT/ML SOPN      insulin glargine (LANTUS) 100 UNIT/ML injection Inject 0.12 mLs (12 Units total) into the skin at bedtime. 10 mL 11   insulin lispro (HUMALOG) 100 UNIT/ML KwikPen Inject 4-8 Units into the skin in the morning and at bedtime.     Insulin Syringe-Needle U-100 30G X 5/16" 1 ML MISC      LEVEMIR FLEXTOUCH 100 UNIT/ML FlexPen INJECT 8 UNITS UNDER THE SKIN AT BEDTIME     losartan (COZAAR) 50 MG tablet Take 50 mg by mouth daily.     metoprolol succinate (TOPROL-XL) 25 MG 24 hr tablet Take 25 mg by mouth daily.     Multiple Vitamins-Minerals (WOMENS MULTI VITAMIN & MINERAL PO) Take 1 tablet by mouth every morning.     pregabalin (LYRICA) 50 MG capsule Take 50 mg by mouth 3 (three) times daily.     traZODone (DESYREL) 50 MG tablet TAKE 1/2 TO 1 TABLET AT BEDTIME AS NEEDED FOR SLEEP 90 tablet 1   No current facility-administered medications on file prior to visit.    There are no Patient Instructions on file for this visit. No follow-ups on file.   Kris Hartmann, NP

## 2021-04-11 ENCOUNTER — Other Ambulatory Visit (INDEPENDENT_AMBULATORY_CARE_PROVIDER_SITE_OTHER): Payer: Self-pay | Admitting: Nurse Practitioner

## 2021-04-11 ENCOUNTER — Ambulatory Visit (INDEPENDENT_AMBULATORY_CARE_PROVIDER_SITE_OTHER): Payer: Medicare HMO

## 2021-04-11 ENCOUNTER — Telehealth (INDEPENDENT_AMBULATORY_CARE_PROVIDER_SITE_OTHER): Payer: Self-pay

## 2021-04-11 ENCOUNTER — Ambulatory Visit (INDEPENDENT_AMBULATORY_CARE_PROVIDER_SITE_OTHER): Payer: Medicare HMO | Admitting: Vascular Surgery

## 2021-04-11 ENCOUNTER — Other Ambulatory Visit: Payer: Self-pay

## 2021-04-11 ENCOUNTER — Encounter (INDEPENDENT_AMBULATORY_CARE_PROVIDER_SITE_OTHER): Payer: Self-pay | Admitting: Vascular Surgery

## 2021-04-11 ENCOUNTER — Telehealth (INDEPENDENT_AMBULATORY_CARE_PROVIDER_SITE_OTHER): Payer: Self-pay | Admitting: Vascular Surgery

## 2021-04-11 VITALS — BP 133/82 | HR 66 | Resp 18 | Ht 59.0 in | Wt 117.0 lb

## 2021-04-11 DIAGNOSIS — L819 Disorder of pigmentation, unspecified: Secondary | ICD-10-CM | POA: Diagnosis not present

## 2021-04-11 DIAGNOSIS — M79606 Pain in leg, unspecified: Secondary | ICD-10-CM

## 2021-04-11 DIAGNOSIS — I70221 Atherosclerosis of native arteries of extremities with rest pain, right leg: Secondary | ICD-10-CM

## 2021-04-11 DIAGNOSIS — I10 Essential (primary) hypertension: Secondary | ICD-10-CM | POA: Diagnosis not present

## 2021-04-11 DIAGNOSIS — I25118 Atherosclerotic heart disease of native coronary artery with other forms of angina pectoris: Secondary | ICD-10-CM | POA: Diagnosis not present

## 2021-04-11 DIAGNOSIS — I739 Peripheral vascular disease, unspecified: Secondary | ICD-10-CM

## 2021-04-11 DIAGNOSIS — R209 Unspecified disturbances of skin sensation: Secondary | ICD-10-CM

## 2021-04-11 DIAGNOSIS — R200 Anesthesia of skin: Secondary | ICD-10-CM

## 2021-04-11 DIAGNOSIS — R6889 Other general symptoms and signs: Secondary | ICD-10-CM | POA: Diagnosis not present

## 2021-04-11 DIAGNOSIS — I70229 Atherosclerosis of native arteries of extremities with rest pain, unspecified extremity: Secondary | ICD-10-CM | POA: Insufficient documentation

## 2021-04-11 DIAGNOSIS — K551 Chronic vascular disorders of intestine: Secondary | ICD-10-CM

## 2021-04-11 DIAGNOSIS — I998 Other disorder of circulatory system: Secondary | ICD-10-CM

## 2021-04-11 DIAGNOSIS — E119 Type 2 diabetes mellitus without complications: Secondary | ICD-10-CM

## 2021-04-11 DIAGNOSIS — Z794 Long term (current) use of insulin: Secondary | ICD-10-CM

## 2021-04-11 MED ORDER — OXYCODONE-ACETAMINOPHEN 5-325 MG PO TABS: 1.0000 | ORAL_TABLET | ORAL | 0 refills | Status: DC | PRN

## 2021-04-11 NOTE — Telephone Encounter (Signed)
Patient is schedule for right leg angiogram with Dr Delana Meyer on 04/12/21 arrival time 1:30 at the [facility]. Pre-procedure instructions were left on patient voicemail.

## 2021-04-11 NOTE — H&P (View-Only) (Signed)
  MRN : [id number]  Heather [name] is a 70 y.o. (11/20/1950) female who presents with chief complaint of right leg pain.  History of Present Illness:   The patient presents urgently to the office for right leg evaluation and review of the noninvasive studies. There has been a significant deterioration in the right lower extremity symptoms.  The patient notes interval shortening of their claudication distance and development of severe right leg rest pain symptoms. No new ulcers or wounds have occurred since the last visit.  Symptoms began abruptly yesterday.  She did fall but no other injuries noted  There have been no significant changes to the patient's overall health care.  The patient denies amaurosis fugax or recent TIA symptoms. There are no recent neurological changes noted. The patient denies history of DVT, PE or superficial thrombophlebitis. The patient denies recent episodes of angina or shortness of breath.   ABI's Rt=0.60 and Lt=1.10 (previous ABI's Rt=1.02 and Lt=1.08)  Current Meds  Medication Sig   ACCU-CHEK AVIVA PLUS test strip    Accu-Chek Softclix Lancets lancets    albuterol (VENTOLIN HFA) 108 (90 Base) MCG/ACT inhaler Inhale into the lungs.   Alcohol Swabs (B-D SINGLE USE SWABS REGULAR) PADS    aspirin EC 81 MG tablet Take 81 mg by mouth daily.   atorvastatin (LIPITOR) 80 MG tablet Take 80 mg by mouth daily.   Blood Glucose Calibration (ACCU-CHEK AVIVA) SOLN    Blood Glucose Monitoring Suppl (ACCU-CHEK AVIVA PLUS) w/Device KIT    cholecalciferol (VITAMIN D) 1000 units tablet Take 1,000 Units by mouth daily. Takes 2000 units daily.   clopidogrel (PLAVIX) 75 MG tablet Take 1 tablet (75 mg total) by mouth daily.   donepezil (ARICEPT) 5 MG tablet Take 5 mg by mouth at bedtime.   escitalopram (LEXAPRO) 20 MG tablet Take 1 tablet (20 mg total) by mouth daily.   fluticasone (FLONASE) 50 MCG/ACT nasal spray    Fluticasone-Salmeterol (ADVAIR) 250-50 MCG/DOSE AEPB Inhale 1  puff into the lungs daily.   furosemide (LASIX) 20 MG tablet Take 20 mg by mouth daily.   Insulin Aspart FlexPen 100 UNIT/ML SOPN    insulin glargine (LANTUS) 100 UNIT/ML injection Inject 0.12 mLs (12 Units total) into the skin at bedtime.   insulin lispro (HUMALOG) 100 UNIT/ML KwikPen Inject 4-8 Units into the skin in the morning and at bedtime.   Insulin Syringe-Needle U-100 30G X 5/16" 1 ML MISC    LEVEMIR FLEXTOUCH 100 UNIT/ML FlexPen INJECT 8 UNITS UNDER THE SKIN AT BEDTIME   losartan (COZAAR) 50 MG tablet Take 50 mg by mouth daily.   metoprolol succinate (TOPROL-XL) 25 MG 24 hr tablet Take 25 mg by mouth daily.   Multiple Vitamins-Minerals (WOMENS MULTI VITAMIN & MINERAL PO) Take 1 tablet by mouth every morning.   pregabalin (LYRICA) 50 MG capsule Take 50 mg by mouth 3 (three) times daily.   traZODone (DESYREL) 50 MG tablet TAKE 1/2 TO 1 TABLET AT BEDTIME AS NEEDED FOR SLEEP    Past Medical History:  Diagnosis Date   Anxiety    Bipolar disorder (HCC)    Cancer (HCC)    skin   Depression    Diabetes mellitus    Diabetes mellitus, type II (HCC)    History of artificial heart valve    Schizophrenia (HCC)    Vertigo     Past Surgical History:  Procedure Laterality Date   ABDOMINAL HYSTERECTOMY     BACK SURGERY     CARDIAC   VALVE REPLACEMENT     CHOLECYSTECTOMY     HEMORRHOID SURGERY     TUBAL LIGATION     VISCERAL ANGIOGRAPHY N/A 11/13/2020   Procedure: VISCERAL ANGIOGRAPHY;  Surgeon: Shashank Kwasnik G, MD;  Location: [facility];  Service: Cardiovascular;  Laterality: N/A;    Social History Social History   Tobacco Use   Smoking status: Every Day    Packs/day: 1.50    Types: Cigarettes   Smokeless tobacco: Never  Vaping Use   Vaping Use: Never used  Substance Use Topics   Alcohol use: Yes    Alcohol/week: 1.0 - 2.0 standard drink    Types: 1 - 2 Cans of beer per week   Drug use: Not Currently    Family History Family History  Problem Relation Age  of Onset   Mental illness Mother    Breast cancer Neg Hx     Allergies  Allergen Reactions   Iodinated Diagnostic Agents Hives   Sulfa Antibiotics    Shellfish Allergy Rash and Other (See Comments)    Scallops specifically cause VOMITING Scallops specifically cause VOMITING      REVIEW OF SYSTEMS (Negative unless checked)  Constitutional: []Weight loss  []Fever  []Chills Cardiac: []Chest pain   []Chest pressure   []Palpitations   []Shortness of breath when laying flat   []Shortness of breath with exertion. Vascular:  [x]Pain in legs with walking   [x]Pain in legs at rest  []History of DVT   []Phlebitis   []Swelling in legs   []Varicose veins   []Non-healing ulcers Pulmonary:   []Uses home oxygen   []Productive cough   []Hemoptysis   []Wheeze  []COPD   []Asthma Neurologic:  []Dizziness   []Seizures   []History of stroke   []History of TIA  []Aphasia   []Vissual changes   []Weakness or numbness in arm   []Weakness or numbness in leg Musculoskeletal:   []Joint swelling   []Joint pain   []Low back pain Hematologic:  []Easy bruising  []Easy bleeding   []Hypercoagulable state   []Anemic Gastrointestinal:  []Diarrhea   []Vomiting  []Gastroesophageal reflux/heartburn   []Difficulty swallowing. Genitourinary:  []Chronic kidney disease   []Difficult urination  []Frequent urination   []Blood in urine Skin:  []Rashes   []Ulcers  Psychological:  []History of anxiety   [] History of major depression.  Physical Examination  Vitals:   04/11/21 1537  BP: 133/82  Pulse: 66  Resp: 18  Weight: 117 lb (53.1 kg)  Height: 4' 11" (1.499 m)   Body mass index is 23.63 kg/m. Gen: WD/WN, NAD Head: [place]/AT, No temporalis wasting.  Ear/Nose/Throat: Hearing grossly intact, nares w/o erythema or drainage Eyes: PER, EOMI, sclera nonicteric.  Neck: Supple, no masses.  No bruit or JVD.  Pulmonary:  Good air movement, no audible wheezing, no use of accessory muscles.  Cardiac: RRR, normal S1, S2, no  Murmurs. Vascular:   right foot is pale and cool to touch with sluggish cap refil Vessel Right Left  Radial Palpable Palpable  PT Not Palpable Trace Palpable  DP Not Palpable Trace Palpable  Gastrointestinal: soft, non-distended. No guarding/no peritoneal signs.  Musculoskeletal: M/S 5/5 throughout.  No visible deformity.  Neurologic: CN 2-12 intact. Pain and light touch intact in extremities.  Symmetrical.  Speech is fluent. Motor exam as listed above. Psychiatric: Judgment intact, Mood & affect appropriate for pt's clinical situation. Dermatologic: No rashes or ulcers noted.  No changes consistent with cellulitis.   CBC Lab Results    Component Value Date   WBC 8.6 11/15/2020   HGB 8.5 (L) 11/15/2020   HCT 26.5 (L) 11/15/2020   MCV 92.0 11/15/2020   PLT 324 11/15/2020    BMET    Component Value Date/Time   NA 141 11/15/2020 0539   NA 135 (L) 06/20/2012 2127   K 3.8 11/15/2020 0539   K 4.0 06/20/2012 2127   CL 115 (H) 11/15/2020 0539   CL 100 06/20/2012 2127   CO2 23 11/15/2020 0539   CO2 27 06/20/2012 2127   GLUCOSE 182 (H) 11/15/2020 0539   GLUCOSE 250 (H) 06/20/2012 2127   BUN 11 11/15/2020 0539   BUN 20 (H) 06/20/2012 2127   CREATININE 0.67 11/15/2020 0539   CREATININE 0.60 06/20/2012 2127   CALCIUM 8.3 (L) 11/15/2020 0539   CALCIUM 8.8 06/20/2012 2127   GFRNONAA >60 11/15/2020 0539   GFRNONAA >60 06/20/2012 2127   GFRAA >60 06/20/2012 2127   CrCl cannot be calculated (Patient's most recent lab result is older than the maximum 21 days allowed.).  COAG No results found for: INR, PROTIME  Radiology VAS US MESENTERIC  Result Date: 03/18/2021 ABDOMINAL VISCERAL Patient Name:  Emmilee Toste  Date of Exam:   03/18/2021 Medical Rec #: [id number]    Accession #:    [id number] Date of Birth: 01/08/1951    Patient Gender: F Patient Age:   69Y Exam Location:  [facility] Procedure:      VAS US MESENTERIC Referring Phys: [id number] FALLON E BROWN  -------------------------------------------------------------------------------- Indications: SMA Stenosis Vascular Interventions: 11/13/2020 SMA stent. Comparison Study: 12/17/2020 Performing Technologist: Solomon Mcclary RVS  Examination Guidelines: A complete evaluation includes B-mode imaging, spectral Doppler, color Doppler, and power Doppler as needed of all accessible portions of each vessel. Bilateral testing is considered an integral part of a complete examination. Limited examinations for reoccurring indications may be performed as noted.  Duplex Findings: +----------------------+--------+--------+------+--------+ Mesenteric            PSV cm/sEDV cm/sPlaqueComments +----------------------+--------+--------+------+--------+ Aorta Prox               49      12                  +----------------------+--------+--------+------+--------+ Aorta Mid                79      13                  +----------------------+--------+--------+------+--------+ Aorta Distal             63      0                   +----------------------+--------+--------+------+--------+ Celiac Artery Origin    171      33                  +----------------------+--------+--------+------+--------+ Celiac Artery Proximal  160      28                  +----------------------+--------+--------+------+--------+ SMA Origin              194      32                  +----------------------+--------+--------+------+--------+ SMA Proximal            266      25                  +----------------------+--------+--------+------+--------+   SMA Mid                 396      34                  +----------------------+--------+--------+------+--------+ SMA Distal              231      24                  +----------------------+--------+--------+------+--------+ CHA                     183      26                  +----------------------+--------+--------+------+--------+ Splenic                  170      22                  +----------------------+--------+--------+------+--------+    Summary: Largest Aortic Diameter: 2.2 cm  Mesenteric: 70 to 99% stenosis in the superior mesenteric artery. The Velociteis have increased in the Hepatic and Splenic Artery compared to prior study. Mild increases seen in the Celiac compared to prior study.  *See table(s) above for measurements and observations.  Diagnosing physician: Devlynn Knoff MD  Electronically signed by Priseis Cratty MD on 03/18/2021 at 4:36:24 PM.    Final    VAS US CAROTID  Result Date: 03/18/2021 Carotid Arterial Duplex Study Patient Name:  Royal Kallam  Date of Exam:   03/18/2021 Medical Rec #: [id number]    Accession #:    [id number] Date of Birth: 11/29/1950    Patient Gender: F Patient Age:   69Y Exam Location:  [facility] Procedure:      VAS US CAROTID Referring Phys: [id number] FALLON E BROWN --------------------------------------------------------------------------------  Indications: Carotid artery disease. Performing Technologist: Solomon Mcclary RVS  Examination Guidelines: A complete evaluation includes B-mode imaging, spectral Doppler, color Doppler, and power Doppler as needed of all accessible portions of each vessel. Bilateral testing is considered an integral part of a complete examination. Limited examinations for reoccurring indications may be performed as noted.  Right Carotid Findings: +----------+-------+-------+--------+---------------------------------+--------+           PSV    EDV    StenosisPlaque Description               Comments           cm/s   cm/s                                                     +----------+-------+-------+--------+---------------------------------+--------+ CCA Prox  71     12                                                       +----------+-------+-------+--------+---------------------------------+--------+ CCA Mid   71     16                                                        +----------+-------+-------+--------+---------------------------------+--------+   CCA Distal74     10                                                       +----------+-------+-------+--------+---------------------------------+--------+ ICA Prox  68     15             heterogenous and calcific                 +----------+-------+-------+--------+---------------------------------+--------+ ICA Mid   103    28                                                       +----------+-------+-------+--------+---------------------------------+--------+ ICA Distal95     19                                                       +----------+-------+-------+--------+---------------------------------+--------+ ECA       207    0              heterogenous, irregular and                                               calcific                                  +----------+-------+-------+--------+---------------------------------+--------+ +----------+--------+-------+--------+-------------------+           PSV cm/sEDV cmsDescribeArm Pressure (mmHG) +----------+--------+-------+--------+-------------------+ Subclavian87      0                                  +----------+--------+-------+--------+-------------------+ +---------+--------+--+--------+--+ VertebralPSV cm/s86EDV cm/s23 +---------+--------+--+--------+--+  Left Carotid Findings: +----------+-------+-------+--------+---------------------------------+--------+           PSV    EDV    StenosisPlaque Description               Comments           cm/s   cm/s                                                     +----------+-------+-------+--------+---------------------------------+--------+ CCA Prox  87     17                                                       +----------+-------+-------+--------+---------------------------------+--------+ CCA Mid   75     16                                                        +----------+-------+-------+--------+---------------------------------+--------+   CCA Distal78     15                                                       +----------+-------+-------+--------+---------------------------------+--------+ ICA Prox  51     15                                                       +----------+-------+-------+--------+---------------------------------+--------+ ICA Mid   105    25             homogeneous and calcific                  +----------+-------+-------+--------+---------------------------------+--------+ ICA Distal186    47                                                       +----------+-------+-------+--------+---------------------------------+--------+ ECA       135    0              heterogenous, irregular and                                               calcific                                  +----------+-------+-------+--------+---------------------------------+--------+ +----------+--------+--------+--------+-------------------+           PSV cm/sEDV cm/sDescribeArm Pressure (mmHG) +----------+--------+--------+--------+-------------------+ Subclavian155     0                                   +----------+--------+--------+--------+-------------------+ +---------+--------+--+--------+--+ VertebralPSV cm/s67EDV cm/s11 +---------+--------+--+--------+--+   Summary: Right Carotid: Velocities in the right ICA are consistent with a 1-39% stenosis. Left Carotid: Velocities suggest a 40-59% stenosis on the High End of the Scale               in the Left ICA. Vertebrals:  Bilateral vertebral arteries demonstrate antegrade flow. Subclavians: Normal flow hemodynamics were seen in bilateral subclavian              arteries. *See table(s) above for measurements and observations.  Electronically signed by Ginger Leeth MD on 03/18/2021 at 4:36:29 PM.    Final       Assessment/Plan 1. Atherosclerosis of native artery of right lower extremity with rest pain (HCC) Recommend:  The patient has evidence of severe atherosclerotic changes of both lower extremities with rest pain that is associated with preulcerative changes and impending tissue loss of the right foot.  This represents a limb threatening ischemia and places the patient at the risk for right limb loss.  Patient should undergo angiography of the lower extremities with the hope for intervention for limb salvage.  The risks and benefits as well as the alternative therapies   was discussed in detail with the patient.  All questions were answered.  Patient agrees to proceed with right leg angiography.  The patient will follow up with me in the office after the procedure.     - Procedural/ Surgical Case Request: Lower Extremity Angiography; Future  2. Ischemic leg pain Recommend:  The patient has evidence of severe atherosclerotic changes of both lower extremities with rest pain that is associated with preulcerative changes and impending tissue loss of the right foot.  This represents a limb threatening ischemia and places the patient at the risk for right limb loss.  Patient should undergo angiography of the lower extremities with the hope for intervention for limb salvage.  The risks and benefits as well as the alternative therapies was discussed in detail with the patient.  All questions were answered.  Patient agrees to proceed with right leg angiography.  The patient will follow up with me in the office after the procedure.  - Procedural/ Surgical Case Request: Lower Extremity Angiography; Future  3. Coronary artery disease of native artery of native heart with stable angina pectoris (HCC) Continue cardiac and antihypertensive medications as already ordered and reviewed, no changes at this time.  Continue statin as ordered and reviewed, no changes at this time  Nitrates PRN for chest pain    4. Hypertension, benign Continue antihypertensive medications as already ordered, these medications have been reviewed and there are no changes at this time.   5. Chronic mesenteric ischemia (HCC) Recommend:   The patient has evidence of chronic asymptomatic mesenteric atherosclerosis.  The patient denies lifestyle limiting changes at this point in time.  Given the lack of symptoms her stent is patent.  No intervention is warranted at this time.    No further invasive studies, angiography or surgery at this time   The patient should continue walking and begin a more formal exercise program.   The patient should continue antiplatelet therapy and aggressive treatment of the lipid abnormalities   Patient should undergo noninvasive studies as ordered. The patient will follow up with me after the studies.    6. Type 2 diabetes mellitus without complication, with long-term current use of insulin (HCC) Continue hypoglycemic medications as already ordered, these medications have been reviewed and there are no changes at this time.  Hgb A1C to be monitored as already arranged by primary service    Clarice Zulauf, MD  04/11/2021 4:09 PM    

## 2021-04-11 NOTE — Telephone Encounter (Signed)
Called stating that right leg is cold, blue and numb, the left one has some of the same symptoms but right is worse. Patient states that she is in severe pain.   I spoke with FB and she advised me to bring patient in with abi studies today if schedule allows.   Adding patient to the schedule.

## 2021-04-11 NOTE — Progress Notes (Signed)
MRN : [id number]  Heather [name] is a 70 y.o. (08-31-1950) female who presents with chief complaint of right leg pain.  History of Present Illness:   The patient presents urgently to the office for right leg evaluation and review of the noninvasive studies. There has been a significant deterioration in the right lower extremity symptoms.  The patient notes interval shortening of their claudication distance and development of severe right leg rest pain symptoms. No new ulcers or wounds have occurred since the last visit.  Symptoms began abruptly yesterday.  She did fall but no other injuries noted  There have been no significant changes to the patient's overall health care.  The patient denies amaurosis fugax or recent TIA symptoms. There are no recent neurological changes noted. The patient denies history of DVT, PE or superficial thrombophlebitis. The patient denies recent episodes of angina or shortness of breath.   ABI's Rt=0.60 and Lt=1.10 (previous ABI's Rt=1.02 and Lt=1.08)  Current Meds  Medication Sig   ACCU-CHEK AVIVA PLUS test strip    Accu-Chek Softclix Lancets lancets    albuterol (VENTOLIN HFA) 108 (90 Base) MCG/ACT inhaler Inhale into the lungs.   Alcohol Swabs (B-D SINGLE USE SWABS REGULAR) PADS    aspirin EC 81 MG tablet Take 81 mg by mouth daily.   atorvastatin (LIPITOR) 80 MG tablet Take 80 mg by mouth daily.   Blood Glucose Calibration (ACCU-CHEK AVIVA) SOLN    Blood Glucose Monitoring Suppl (ACCU-CHEK AVIVA PLUS) w/Device KIT    cholecalciferol (VITAMIN D) 1000 units tablet Take 1,000 Units by mouth daily. Takes 2000 units daily.   clopidogrel (PLAVIX) 75 MG tablet Take 1 tablet (75 mg total) by mouth daily.   donepezil (ARICEPT) 5 MG tablet Take 5 mg by mouth at bedtime.   escitalopram (LEXAPRO) 20 MG tablet Take 1 tablet (20 mg total) by mouth daily.   fluticasone (FLONASE) 50 MCG/ACT nasal spray    Fluticasone-Salmeterol (ADVAIR) 250-50 MCG/DOSE AEPB Inhale 1  puff into the lungs daily.   furosemide (LASIX) 20 MG tablet Take 20 mg by mouth daily.   Insulin Aspart FlexPen 100 UNIT/ML SOPN    insulin glargine (LANTUS) 100 UNIT/ML injection Inject 0.12 mLs (12 Units total) into the skin at bedtime.   insulin lispro (HUMALOG) 100 UNIT/ML KwikPen Inject 4-8 Units into the skin in the morning and at bedtime.   Insulin Syringe-Needle U-100 30G X 5/16" 1 ML MISC    LEVEMIR FLEXTOUCH 100 UNIT/ML FlexPen INJECT 8 UNITS UNDER THE SKIN AT BEDTIME   losartan (COZAAR) 50 MG tablet Take 50 mg by mouth daily.   metoprolol succinate (TOPROL-XL) 25 MG 24 hr tablet Take 25 mg by mouth daily.   Multiple Vitamins-Minerals (WOMENS MULTI VITAMIN & MINERAL PO) Take 1 tablet by mouth every morning.   pregabalin (LYRICA) 50 MG capsule Take 50 mg by mouth 3 (three) times daily.   traZODone (DESYREL) 50 MG tablet TAKE 1/2 TO 1 TABLET AT BEDTIME AS NEEDED FOR SLEEP    Past Medical History:  Diagnosis Date   Anxiety    Bipolar disorder ([place])    Cancer ([place])    skin   Depression    Diabetes mellitus    Diabetes mellitus, type II ([place])    History of artificial heart valve    Schizophrenia ([place])    Vertigo     Past Surgical History:  Procedure Laterality Date   ABDOMINAL HYSTERECTOMY     BACK SURGERY     CARDIAC  VALVE REPLACEMENT     CHOLECYSTECTOMY     HEMORRHOID SURGERY     TUBAL LIGATION     VISCERAL ANGIOGRAPHY N/A 11/13/2020   Procedure: VISCERAL ANGIOGRAPHY;  Surgeon: [name], MD;  Location: [facility];  Service: Cardiovascular;  Laterality: N/A;    Social History Social History   Tobacco Use   Smoking status: Every Day    Packs/day: 1.50    Types: Cigarettes   Smokeless tobacco: Never  Vaping Use   Vaping Use: Never used  Substance Use Topics   Alcohol use: Yes    Alcohol/week: 1.0 - 2.0 standard drink    Types: 1 - 2 Cans of beer per week   Drug use: Not Currently    Family History Family History  Problem Relation Age  of Onset   Mental illness Mother    Breast cancer Neg Hx     Allergies  Allergen Reactions   Iodinated Diagnostic Agents Hives   Sulfa Antibiotics    Shellfish Allergy Rash and Other (See Comments)    Scallops specifically cause VOMITING Scallops specifically cause VOMITING      REVIEW OF SYSTEMS (Negative unless checked)  Constitutional: _0 Weight loss  _1 Fever  _2 Chills Cardiac: _3 Chest pain   _4 Chest pressure   _5 Palpitations   _6 Shortness of breath when laying flat   _7 Shortness of breath with exertion. Vascular:  _8 Pain in legs with walking   _9 Pain in legs at rest  _10 History of DVT   _11 Phlebitis   _12 Swelling in legs   _13 Varicose veins   _14 Non-healing ulcers Pulmonary:   _15 Uses home oxygen   _16 Productive cough   _17 Hemoptysis   _18 Wheeze  _19 COPD   _20 Asthma Neurologic:  _21 Dizziness   _22 Seizures   _23 History of stroke   _24 History of TIA  _25 Aphasia   _26 Vissual changes   _27 Weakness or numbness in arm   _28 Weakness or numbness in leg Musculoskeletal:   _29 Joint swelling   _30 Joint pain   _31 Low back pain Hematologic:  _32 Easy bruising  _33 Easy bleeding   _34 Hypercoagulable state   _35 Anemic Gastrointestinal:  _36 Diarrhea   _37 Vomiting  _38 Gastroesophageal reflux/heartburn   _39 Difficulty swallowing. Genitourinary:  _40 Chronic kidney disease   _41 Difficult urination  _42 Frequent urination   _43 Blood in urine Skin:  _44 Rashes   _45 Ulcers  Psychological:  _46 History of anxiety   _47  History of major depression.  Physical Examination  Vitals:   04/11/21 1537  BP: 133/82  Pulse: 66  Resp: 18  Weight: 117 lb (53.1 kg)  Height: _48  (1.499 m)   Body mass index is 23.63 kg/m. Gen: WD/WN, NAD Head: /AT, No temporalis wasting.  Ear/Nose/Throat: Hearing grossly intact, nares w/o erythema or drainage Eyes: PER, EOMI, sclera nonicteric.  Neck: Supple, no masses.  No bruit or JVD.  Pulmonary:  Good air movement, no audible wheezing, no use of accessory muscles.  Cardiac: RRR, normal S1, S2, no  Murmurs. Vascular:   right foot is pale and cool to touch with sluggish cap refil Vessel Right Left  Radial Palpable Palpable  PT Not Palpable Trace Palpable  DP Not Palpable Trace Palpable  Gastrointestinal: soft, non-distended. No guarding/no peritoneal signs.  Musculoskeletal: M/S 5/5 throughout.  No visible deformity.  Neurologic: CN 2-12 intact. Pain and light touch intact in extremities.  Symmetrical.  Speech is fluent. Motor exam as listed above. Psychiatric: Judgment intact, Mood & affect appropriate for pt's clinical situation. Dermatologic: No rashes or ulcers noted.  No changes consistent with cellulitis.   CBC Lab Results  Component Value Date   WBC 8.6 11/15/2020   HGB 8.5 (L) 11/15/2020   HCT 26.5 (L) 11/15/2020   MCV 92.0 11/15/2020   PLT 324 11/15/2020    BMET    Component Value Date/Time   NA 141 11/15/2020 0539   NA 135 (L) 06/20/2012 2127   K 3.8 11/15/2020 0539   K 4.0 06/20/2012 2127   CL 115 (H) 11/15/2020 0539   CL 100 06/20/2012 2127   CO2 23 11/15/2020 0539   CO2 27 06/20/2012 2127   GLUCOSE 182 (H) 11/15/2020 0539   GLUCOSE 250 (H) 06/20/2012 2127   BUN 11 11/15/2020 0539   BUN 20 (H) 06/20/2012 2127   CREATININE 0.67 11/15/2020 0539   CREATININE 0.60 06/20/2012 2127   CALCIUM 8.3 (L) 11/15/2020 0539   CALCIUM 8.8 06/20/2012 2127   GFRNONAA >60 11/15/2020 0539   GFRNONAA >60 06/20/2012 2127   GFRAA >60 06/20/2012 2127   CrCl cannot be calculated (Patient's most recent lab result is older than the maximum 21 days allowed.).  COAG No results found for: INR, PROTIME  Radiology VAS [place] MESENTERIC  Result Date: 03/18/2021 ABDOMINAL VISCERAL Patient Name:  Heather [name]  Date of Exam:   03/18/2021 Medical Rec #: [id number]    Accession #:    [id number] Date of Birth: Apr 12, 1951    Patient Gender: F Patient Age:   69Y Exam Location:  [facility] Procedure:      VAS [place] MESENTERIC Referring Phys: [id number] Kris Hartmann  -------------------------------------------------------------------------------- Indications: SMA Stenosis Vascular Interventions: 11/13/2020 SMA stent. Comparison Study: 12/17/2020 Performing Technologist: Almira Coaster RVS  Examination Guidelines: A complete evaluation includes B-mode imaging, spectral Doppler, color Doppler, and power Doppler as needed of all accessible portions of each vessel. Bilateral testing is considered an integral part of a complete examination. Limited examinations for reoccurring indications may be performed as noted.  Duplex Findings: +----------------------+--------+--------+------+--------+ Mesenteric            PSV cm/sEDV cm/sPlaqueComments +----------------------+--------+--------+------+--------+ Aorta Prox               49      12                  +----------------------+--------+--------+------+--------+ Aorta Mid                79      13                  +----------------------+--------+--------+------+--------+ Aorta Distal             63      0                   +----------------------+--------+--------+------+--------+ Celiac Artery Origin    171      33                  +----------------------+--------+--------+------+--------+ Celiac Artery Proximal  160      28                  +----------------------+--------+--------+------+--------+ SMA Origin              194      32                  +----------------------+--------+--------+------+--------+ SMA Proximal            266      25                  +----------------------+--------+--------+------+--------+  SMA Mid                 396      34                  +----------------------+--------+--------+------+--------+ SMA Distal              231      24                  +----------------------+--------+--------+------+--------+ CHA                     183      26                  +----------------------+--------+--------+------+--------+ Splenic                  170      22                  +----------------------+--------+--------+------+--------+    Summary: Largest Aortic Diameter: 2.2 cm  Mesenteric: 70 to 99% stenosis in the superior mesenteric artery. The Velociteis have increased in the Hepatic and Splenic Artery compared to prior study. Mild increases seen in the Celiac compared to prior study.  *See table(s) above for measurements and observations.  Diagnosing physician: [name] MD  Electronically signed by [name] MD on 03/18/2021 at 4:36:24 PM.    Final    VAS US CAROTID  Result Date: 03/18/2021 Carotid Arterial Duplex Study Patient Name:  Heather [name]  Date of Exam:   03/18/2021 Medical Rec #: [id number]    Accession #:    [id number] Date of Birth: 08-03-1951    Patient Gender: F Patient Age:   69Y [facility] Procedure:      VAS US CAROTID Referring Phys: [id number] Kris Hartmann --------------------------------------------------------------------------------  Indications: Carotid artery disease. Performing Technologist: Almira Coaster RVS  Examination Guidelines: A complete evaluation includes B-mode imaging, spectral Doppler, color Doppler, and power Doppler as needed of all accessible portions of each vessel. Bilateral testing is considered an integral part of a complete examination. Limited examinations for reoccurring indications may be performed as noted.  Right Carotid Findings: +----------+-------+-------+--------+---------------------------------+--------+           PSV    EDV    StenosisPlaque Description               Comments           cm/s   cm/s                                                     +----------+-------+-------+--------+---------------------------------+--------+ CCA Prox  71     12                                                       +----------+-------+-------+--------+---------------------------------+--------+ CCA Mid   71     16                                                        +----------+-------+-------+--------+---------------------------------+--------+  CCA Distal74     10                                                       +----------+-------+-------+--------+---------------------------------+--------+ ICA Prox  68     15             heterogenous and calcific                 +----------+-------+-------+--------+---------------------------------+--------+ ICA Mid   103    28                                                       +----------+-------+-------+--------+---------------------------------+--------+ ICA Distal95     19                                                       +----------+-------+-------+--------+---------------------------------+--------+ ECA       207    0              heterogenous, irregular and                                               calcific                                  +----------+-------+-------+--------+---------------------------------+--------+ +----------+--------+-------+--------+-------------------+           PSV cm/sEDV cmsDescribeArm Pressure (mmHG) +----------+--------+-------+--------+-------------------+ [id number]      0                                  +----------+--------+-------+--------+-------------------+ +---------+--------+--+--------+--+ VertebralPSV cm/s86EDV cm/s23 +---------+--------+--+--------+--+  Left Carotid Findings: +----------+-------+-------+--------+---------------------------------+--------+           PSV    EDV    StenosisPlaque Description               Comments           cm/s   cm/s                                                     +----------+-------+-------+--------+---------------------------------+--------+ CCA Prox  87     17                                                       +----------+-------+-------+--------+---------------------------------+--------+ CCA Mid   75     16                                                        +----------+-------+-------+--------+---------------------------------+--------+  CCA Distal78     15                                                       +----------+-------+-------+--------+---------------------------------+--------+ ICA Prox  51     15                                                       +----------+-------+-------+--------+---------------------------------+--------+ ICA Mid   105    25             homogeneous and calcific                  +----------+-------+-------+--------+---------------------------------+--------+ ICA Distal186    47                                                       +----------+-------+-------+--------+---------------------------------+--------+ ECA       135    0              heterogenous, irregular and                                               calcific                                  +----------+-------+-------+--------+---------------------------------+--------+ +----------+--------+--------+--------+-------------------+           PSV cm/sEDV cm/sDescribeArm Pressure (mmHG) +----------+--------+--------+--------+-------------------+ Subclavian155     0                                   +----------+--------+--------+--------+-------------------+ +---------+--------+--+--------+--+ VertebralPSV cm/s67EDV cm/s11 +---------+--------+--+--------+--+   Summary: Right Carotid: Velocities in the right ICA are consistent with a 1-39% stenosis. Left Carotid: Velocities suggest a 40-59% stenosis on the High End of the Scale               in the Left ICA. Vertebrals:  Bilateral vertebral arteries demonstrate antegrade flow. Subclavians: Normal flow hemodynamics were seen in bilateral subclavian              arteries. *See table(s) above for measurements and observations.  Electronically signed by [name] MD on 03/18/2021 at 4:36:29 PM.    Final       Assessment/Plan 1. Atherosclerosis of native artery of right lower extremity with rest pain (HCC) Recommend:  The patient has evidence of severe atherosclerotic changes of both lower extremities with rest pain that is associated with preulcerative changes and impending tissue loss of the right foot.  This represents a limb threatening ischemia and places the patient at the risk for right limb loss.  Patient should undergo angiography of the lower extremities with the hope for intervention for limb salvage.  The risks and benefits as well as the alternative therapies  was discussed in detail with the patient.  All questions were answered.  Patient agrees to proceed with right leg angiography.  The patient will follow up with me in the office after the procedure.     - Procedural/ Surgical Case Request: Lower Extremity Angiography; Future  2. Ischemic leg pain Recommend:  The patient has evidence of severe atherosclerotic changes of both lower extremities with rest pain that is associated with preulcerative changes and impending tissue loss of the right foot.  This represents a limb threatening ischemia and places the patient at the risk for right limb loss.  Patient should undergo angiography of the lower extremities with the hope for intervention for limb salvage.  The risks and benefits as well as the alternative therapies was discussed in detail with the patient.  All questions were answered.  Patient agrees to proceed with right leg angiography.  The patient will follow up with me in the office after the procedure.  - Procedural/ Surgical Case Request: Lower Extremity Angiography; Future  3. Coronary artery disease of native artery of native heart with stable angina pectoris (HCC) Continue cardiac and antihypertensive medications as already ordered and reviewed, no changes at this time.  Continue statin as ordered and reviewed, no changes at this time  Nitrates PRN for chest pain    4. Hypertension, benign Continue antihypertensive medications as already ordered, these medications have been reviewed and there are no changes at this time.   5. Chronic mesenteric ischemia (HCC) Recommend:   The patient has evidence of chronic asymptomatic mesenteric atherosclerosis.  The patient denies lifestyle limiting changes at this point in time.  Given the lack of symptoms her stent is patent.  No intervention is warranted at this time.    No further invasive studies, angiography or surgery at this time   The patient should continue walking and begin a more formal exercise program.   The patient should continue antiplatelet therapy and aggressive treatment of the lipid abnormalities   Patient should undergo noninvasive studies as ordered. The patient will follow up with me after the studies.    6. Type 2 diabetes mellitus without complication, with long-term current use of insulin (HCC) Continue hypoglycemic medications as already ordered, these medications have been reviewed and there are no changes at this time.  Hgb A1C to be monitored as already arranged by primary service    [name], MD  04/11/2021 4:09 PM

## 2021-04-12 ENCOUNTER — Other Ambulatory Visit: Payer: Self-pay

## 2021-04-12 ENCOUNTER — Encounter
Admission: RE | Disposition: A | Discharge status: Home / Self Care | Payer: Self-pay | Source: Home / Self Care | Attending: Vascular Surgery

## 2021-04-12 ENCOUNTER — Ambulatory Visit
Admission: RE | Admit: 2021-04-12 | Discharge: 2021-04-12 | Disposition: A | Discharge status: Home / Self Care | Payer: Medicare HMO | Attending: Vascular Surgery | Admitting: Vascular Surgery

## 2021-04-12 ENCOUNTER — Encounter: Payer: Self-pay | Admitting: Vascular Surgery

## 2021-04-12 DIAGNOSIS — Z794 Long term (current) use of insulin: Secondary | ICD-10-CM | POA: Diagnosis not present

## 2021-04-12 DIAGNOSIS — Z91041 Radiographic dye allergy status: Secondary | ICD-10-CM | POA: Diagnosis not present

## 2021-04-12 DIAGNOSIS — Z7902 Long term (current) use of antithrombotics/antiplatelets: Secondary | ICD-10-CM | POA: Insufficient documentation

## 2021-04-12 DIAGNOSIS — E119 Type 2 diabetes mellitus without complications: Secondary | ICD-10-CM | POA: Diagnosis not present

## 2021-04-12 DIAGNOSIS — Z79899 Other long term (current) drug therapy: Secondary | ICD-10-CM | POA: Diagnosis not present

## 2021-04-12 DIAGNOSIS — Z882 Allergy status to sulfonamides status: Secondary | ICD-10-CM | POA: Insufficient documentation

## 2021-04-12 DIAGNOSIS — M79606 Pain in leg, unspecified: Secondary | ICD-10-CM

## 2021-04-12 DIAGNOSIS — F17210 Nicotine dependence, cigarettes, uncomplicated: Secondary | ICD-10-CM | POA: Insufficient documentation

## 2021-04-12 DIAGNOSIS — I70229 Atherosclerosis of native arteries of extremities with rest pain, unspecified extremity: Secondary | ICD-10-CM | POA: Insufficient documentation

## 2021-04-12 DIAGNOSIS — I10 Essential (primary) hypertension: Secondary | ICD-10-CM | POA: Insufficient documentation

## 2021-04-12 DIAGNOSIS — Z7982 Long term (current) use of aspirin: Secondary | ICD-10-CM | POA: Insufficient documentation

## 2021-04-12 DIAGNOSIS — I998 Other disorder of circulatory system: Secondary | ICD-10-CM | POA: Insufficient documentation

## 2021-04-12 DIAGNOSIS — K551 Chronic vascular disorders of intestine: Secondary | ICD-10-CM | POA: Insufficient documentation

## 2021-04-12 DIAGNOSIS — I25118 Atherosclerotic heart disease of native coronary artery with other forms of angina pectoris: Secondary | ICD-10-CM | POA: Diagnosis not present

## 2021-04-12 DIAGNOSIS — I70221 Atherosclerosis of native arteries of extremities with rest pain, right leg: Secondary | ICD-10-CM | POA: Insufficient documentation

## 2021-04-12 DIAGNOSIS — I739 Peripheral vascular disease, unspecified: Secondary | ICD-10-CM | POA: Insufficient documentation

## 2021-04-12 DIAGNOSIS — Z91013 Allergy to seafood: Secondary | ICD-10-CM | POA: Insufficient documentation

## 2021-04-12 HISTORY — PX: LOWER EXTREMITY ANGIOGRAPHY: CATH118251

## 2021-04-12 LAB — BUN: BUN: 46 mg/dL — ABNORMAL HIGH (ref 8–23)

## 2021-04-12 LAB — CREATININE, SERUM
Creatinine, Ser: 1.19 mg/dL — ABNORMAL HIGH (ref 0.44–1.00)
GFR, Estimated: 50 mL/min — ABNORMAL LOW (ref >60)

## 2021-04-12 LAB — GLUCOSE, CAPILLARY
Glucose-Capillary: 153 mg/dL — ABNORMAL HIGH (ref 70–99)
Glucose-Capillary: 113 mg/dL — ABNORMAL HIGH (ref 70–99)

## 2021-04-12 SURGERY — LOWER EXTREMITY ANGIOGRAPHY
Anesthesia: Moderate Sedation | Site: Leg Lower | Laterality: Right

## 2021-04-12 MED ORDER — SODIUM CHLORIDE 0.9% FLUSH: 3.0000 mL | INTRAVENOUS | Status: DC | PRN

## 2021-04-12 MED ORDER — METHYLPREDNISOLONE SODIUM SUCC 125 MG IJ SOLR: 125.0000 mg | Freq: Once | INTRAMUSCULAR | Status: AC | PRN

## 2021-04-12 MED ORDER — ONDANSETRON HCL 4 MG/2ML IJ SOLN: 4.0000 mg | Freq: Four times a day (QID) | INTRAMUSCULAR | Status: DC | PRN

## 2021-04-12 MED ORDER — ACETAMINOPHEN 325 MG PO TABS: 650.0000 mg | ORAL_TABLET | ORAL | Status: DC | PRN

## 2021-04-12 MED ORDER — SODIUM CHLORIDE 0.9 % IV SOLN: INTRAVENOUS | Status: DC

## 2021-04-12 MED ORDER — OXYCODONE HCL 5 MG PO TABS: 5.0000 mg | ORAL_TABLET | ORAL | Status: DC | PRN

## 2021-04-12 MED ORDER — MIDAZOLAM HCL 2 MG/2ML IJ SOLN
INTRAMUSCULAR | Status: DC | PRN
  Administered 2021-04-12 (×2): 1 mg via INTRAVENOUS

## 2021-04-12 MED ORDER — HYDRALAZINE HCL 20 MG/ML IJ SOLN
5.0000 mg | INTRAMUSCULAR | Status: DC | PRN
Start: 2021-04-12 — End: 2021-04-13

## 2021-04-12 MED ORDER — SODIUM CHLORIDE 0.9 % IV SOLN: 250.0000 mL | INTRAVENOUS | Status: DC | PRN

## 2021-04-12 MED ORDER — HEPARIN SODIUM (PORCINE) 1000 UNIT/ML IJ SOLN
INTRAMUSCULAR | Status: DC | PRN
  Administered 2021-04-12: 5000 [IU] via INTRAVENOUS

## 2021-04-12 MED ORDER — LABETALOL HCL 5 MG/ML IV SOLN: 10.0000 mg | INTRAVENOUS | Status: DC | PRN

## 2021-04-12 MED ORDER — ATORVASTATIN CALCIUM 10 MG PO TABS: 10.0000 mg | ORAL_TABLET | Freq: Every day | ORAL | Status: DC

## 2021-04-12 MED ORDER — FAMOTIDINE 20 MG PO TABS
ORAL_TABLET | ORAL | Status: AC
  Administered 2021-04-12: 40 mg via ORAL
  Filled 2021-04-12: qty 2

## 2021-04-12 MED ORDER — HYDROMORPHONE HCL 1 MG/ML IJ SOLN: 1.0000 mg | Freq: Once | INTRAMUSCULAR | Status: DC | PRN

## 2021-04-12 MED ORDER — MORPHINE SULFATE (PF) 4 MG/ML IV SOLN: 2.0000 mg | INTRAVENOUS | Status: DC | PRN

## 2021-04-12 MED ORDER — DIPHENHYDRAMINE HCL 50 MG/ML IJ SOLN
INTRAMUSCULAR | Status: AC
  Administered 2021-04-12: 50 mg via INTRAVENOUS
  Filled 2021-04-12: qty 1

## 2021-04-12 MED ORDER — FENTANYL CITRATE (PF) 100 MCG/2ML IJ SOLN
INTRAMUSCULAR | Status: AC
  Filled 2021-04-12: qty 2

## 2021-04-12 MED ORDER — MIDAZOLAM HCL 5 MG/5ML IJ SOLN
INTRAMUSCULAR | Status: AC
  Filled 2021-04-12: qty 5

## 2021-04-12 MED ORDER — METHYLPREDNISOLONE SODIUM SUCC 125 MG IJ SOLR
INTRAMUSCULAR | Status: AC
  Administered 2021-04-12: 125 mg via INTRAVENOUS
  Filled 2021-04-12: qty 2

## 2021-04-12 MED ORDER — FAMOTIDINE 20 MG PO TABS: 40.0000 mg | ORAL_TABLET | Freq: Once | ORAL | Status: AC | PRN

## 2021-04-12 MED ORDER — HEPARIN SODIUM (PORCINE) 1000 UNIT/ML IJ SOLN
INTRAMUSCULAR | Status: AC
  Filled 2021-04-12: qty 1

## 2021-04-12 MED ORDER — CEFAZOLIN SODIUM-DEXTROSE 2-4 GM/100ML-% IV SOLN
2.0000 g | Freq: Once | INTRAVENOUS | Status: AC
  Administered 2021-04-12: 2 g via INTRAVENOUS

## 2021-04-12 MED ORDER — FENTANYL CITRATE (PF) 100 MCG/2ML IJ SOLN
INTRAMUSCULAR | Status: DC | PRN
  Administered 2021-04-12: 25 ug via INTRAVENOUS
  Administered 2021-04-12: 50 ug via INTRAVENOUS

## 2021-04-12 MED ORDER — SODIUM CHLORIDE 0.9% FLUSH: 3.0000 mL | Freq: Two times a day (BID) | INTRAVENOUS | Status: DC

## 2021-04-12 MED ORDER — MIDAZOLAM HCL 2 MG/ML PO SYRP: 8.0000 mg | ORAL_SOLUTION | Freq: Once | ORAL | Status: DC | PRN

## 2021-04-12 MED ORDER — DIPHENHYDRAMINE HCL 50 MG/ML IJ SOLN: 50.0000 mg | Freq: Once | INTRAMUSCULAR | Status: AC | PRN

## 2021-04-12 SURGICAL SUPPLY — 22 items
BALLN LUTONIX 4X220X130 (BALLOONS) ×4
BALLOON LUTONIX 4X220X130 (BALLOONS) ×2 IMPLANT
CATH BEACON 5 .038 100 VERT TP (CATHETERS) ×2 IMPLANT
CATH PIG 70CM (CATHETERS) ×4 IMPLANT
CATH TEMPO 5F RIM 65CM (CATHETERS) ×2 IMPLANT
COVER PROBE U/S 5X48 (MISCELLANEOUS) ×2 IMPLANT
DEVICE STARCLOSE SE CLOSURE (Vascular Products) ×2 IMPLANT
DEVICE TORQUE .025-.038 (MISCELLANEOUS) ×2 IMPLANT
GLIDEWIRE ADV .035X260CM (WIRE) ×2 IMPLANT
KIT ENCORE 26 ADVANTAGE (KITS) ×2 IMPLANT
KIT MICROPUNCTURE NIT STIFF (SHEATH) ×2 IMPLANT
NEEDLE ENTRY 21GA 7CM ECHOTIP (NEEDLE) ×2 IMPLANT
PACK ANGIOGRAPHY (CUSTOM PROCEDURE TRAY) ×2 IMPLANT
SET INTRO CAPELLA COAXIAL (SET/KITS/TRAYS/PACK) ×2 IMPLANT
SHEATH ANL2 6FRX45 HC (SHEATH) ×2 IMPLANT
SHEATH BRITE TIP 5FRX11 (SHEATH) ×2 IMPLANT
STENT LIFESTENT 5F 5X120X135 (Permanent Stent) ×2 IMPLANT
STENT LIFESTENT 5F 5X150X135 (Permanent Stent) ×2 IMPLANT
SYR MEDRAD MARK 7 150ML (SYRINGE) ×2 IMPLANT
TUBING CONTRAST HIGH PRESS 72 (TUBING) ×2 IMPLANT
WIRE GUIDERIGHT .035X150 (WIRE) ×2 IMPLANT
WIRE HI TORQ VERSACORE 300 (WIRE) ×2 IMPLANT

## 2021-04-12 NOTE — Progress Notes (Signed)
PT injected with epi with lidocaine in L groin and dressing changed.

## 2021-04-12 NOTE — Interval H&P Note (Signed)
History and Physical Interval Note:  04/12/2021 4:35 PM  Heather [name]  has presented today for surgery, with the diagnosis of RT Lower Extremity Angiography  ASO with rest pain BARD Rep   SHELLFISH / IODINE ALLERGY.  The various methods of treatment have been discussed with the patient and family. After consideration of risks, benefits and other options for treatment, the patient has consented to  Procedure(s): Lower Extremity Angiography (Right) as a surgical intervention.  The patient's history has been reviewed, patient examined, no change in status, stable for surgery.  I have reviewed the patient's chart and labs.  Questions were answered to the patient's satisfaction.     [name]

## 2021-04-12 NOTE — Progress Notes (Signed)
PT noted to still be oozing at site. MD has been messaged, awaiting for him to get out of procedure. 10 min manual pressure applied and new drainage marked. Will continue to monitor. Approx 60% of dressing saturated.

## 2021-04-12 NOTE — Progress Notes (Signed)
PT ambulatory post bed rest to restroom, site remains stable, pt at baseline with mobility

## 2021-04-12 NOTE — Op Note (Signed)
[facility]  Percutaneous Study/Intervention Procedural Note   Date of Surgery: 04/12/2021  Surgeon:  [name], MD.  Pre-operative Diagnosis: Atherosclerotic occlusive disease bilateral lower extremities with acute onset of rest pain right lower extremity  Post-operative diagnosis:  Same  Procedure(s) Performed:             1.  Introduction catheter into right lower extremity 3rd order catheter placement              2.    Contrast injection right lower extremity for distal runoff             3.  Percutaneous transluminal angioplasty and stent placement right superficial femoral artery and popliteal             4.  Star close closure left common femoral arteriotomy  Anesthesia: Conscious sedation was administered under my direct supervision by the interventional radiology RN. IV Versed plus fentanyl were utilized. Continuous ECG, pulse oximetry and blood pressure was monitored throughout the entire procedure.  Conscious sedation was for a total of 45 minutes and 32 seconds.  Sheath: 6 [place] Ansell left common femoral retrograde  Contrast: 50 cc  Fluoroscopy Time: 6.6 minutes  Indications:  Christell Steinmiller presents with acute onset of right lower extremity pain.  Patient is describing rest pain symptoms.  Physical examination as well as noninvasive studies are consistent with significant atherosclerotic occlusive disease with a significant deterioration since her last visit.  The risks and benefits for angiography and revascularization are reviewed all questions answered patient agrees to proceed.  Procedure:  Heather [name] is a 70 y.o. y.o. female who was identified and appropriate procedural time out was performed.  The patient was then placed supine on the table and prepped and draped in the usual sterile fashion.    Ultrasound was placed in the sterile sleeve and the right groin was evaluated the right common femoral artery was echolucent and pulsatile  indicating patency.  Image was recorded for the permanent record and under real-time visualization a microneedle was inserted into the common femoral artery microwire followed by a micro-sheath.  A J-wire was then advanced through the micro-sheath and a  5 [place] sheath was then inserted over a J-wire. J-wire was then advanced and a 5 French pigtail catheter was positioned at the level of T12. AP projection of the aorta was then obtained. Pigtail catheter was repositioned to above the bifurcation and a LAO view of the pelvis was obtained.  Subsequently a rim catheter with the stiff angle Glidewire was used to cross the aortic bifurcation the catheter wire were advanced down into the right distal external iliac artery. Oblique view of the femoral bifurcation was then obtained and subsequently the wire was reintroduced and the pigtail catheter negotiated into the SFA representing third order catheter placement. Distal runoff was then performed.  5000 units of heparin was then given and allowed to circulate and a 6 [place] Ansell sheath was advanced up and over the bifurcation and positioned in the femoral artery  KMP  catheter and advantage Glidewire were then negotiated down into the distal popliteal.  Distal runoff was then completed by hand injection through the catheter. The wire was then reintroduced and a 5 mm x 150 mm life stent is deployed across the Hunter's canal region proximally a second 5 mm x 150 mm life stent is deployed extending the intervention more proximally.  Next, a 4 mm x 220 mm Lutonix drug-eluting balloon is  utilized to treat and fully expand the stents.  2 separate balloon inflations were required.  First balloon inflation was more distal to 8 atm for approximately 1 minute.  Second inflation with more proximal again to approximately 8 atm for 1 minute.  Follow-up imaging demonstrated wide patency of the SFA popliteal with preservation of distal runoff.  There is less than 10% residual  runoff.  After review of these images the sheath is pulled into the left external iliac oblique of the common femoral is obtained and a Star close device deployed. There no immediate complications.   Findings:  The abdominal aorta is opacified with a bolus injection contrast. Renal arteries are patent. The aorta itself has diffuse disease but no hemodynamically significant lesions. The common and external iliac arteries are patent bilaterally with diffuse disease more pronounced on the right than the left.  There may be a significant lesion on the left in the more distal common iliac artery however given her acute onset ischemic symptoms on the right we are focusing on limb salvage on this extremity and we will revisit the left at a later date..  The right common femoral is patent as is the profunda femoris there is moderate disease throughout the common femoral but it does not appear to be flow-limiting.  The SFA does indeed have diffuse hemodynamically significant stenosis from several centimeters distal to the origin down through the above-knee popliteal.  There are 3 focal areas throughout this length of greater than 90% stenosis..  The distal popliteal demonstrates diffuse nonflow limiting disease and the trifurcation is patent with patency of the anterior tibial, peroneal and posterior tibial.  Following angioplasty and stent placement the SFA popliteal has an excellent result with less than 10% residual stenosis.    Summary: Successful recanalization right lower extremity for limb salvage                        Disposition: Patient was taken to the recovery room in stable condition having tolerated the procedure well.  [name] 04/12/2021,4:36 PM

## 2021-04-15 ENCOUNTER — Encounter: Payer: Self-pay | Admitting: Vascular Surgery

## 2021-05-02 ENCOUNTER — Other Ambulatory Visit (INDEPENDENT_AMBULATORY_CARE_PROVIDER_SITE_OTHER): Payer: Self-pay | Admitting: Vascular Surgery

## 2021-05-02 DIAGNOSIS — I70221 Atherosclerosis of native arteries of extremities with rest pain, right leg: Secondary | ICD-10-CM

## 2021-05-02 DIAGNOSIS — Z95820 Peripheral vascular angioplasty status with implants and grafts: Secondary | ICD-10-CM

## 2021-05-06 ENCOUNTER — Ambulatory Visit (INDEPENDENT_AMBULATORY_CARE_PROVIDER_SITE_OTHER): Payer: Medicare HMO | Admitting: Vascular Surgery

## 2021-05-06 ENCOUNTER — Encounter (INDEPENDENT_AMBULATORY_CARE_PROVIDER_SITE_OTHER): Payer: Self-pay

## 2021-05-06 ENCOUNTER — Encounter (INDEPENDENT_AMBULATORY_CARE_PROVIDER_SITE_OTHER): Payer: Self-pay | Admitting: Vascular Surgery

## 2021-05-06 ENCOUNTER — Other Ambulatory Visit: Payer: Self-pay

## 2021-05-06 ENCOUNTER — Ambulatory Visit (INDEPENDENT_AMBULATORY_CARE_PROVIDER_SITE_OTHER): Payer: Medicare HMO

## 2021-05-06 DIAGNOSIS — Z95820 Peripheral vascular angioplasty status with implants and grafts: Secondary | ICD-10-CM | POA: Diagnosis not present

## 2021-05-06 DIAGNOSIS — I70221 Atherosclerosis of native arteries of extremities with rest pain, right leg: Secondary | ICD-10-CM

## 2021-05-06 NOTE — Progress Notes (Signed)
MRN : [id number]  Heather [name] is a 70 y.o. ([birth year]) female who presents with chief complaint of check leg.  History of Present Illness:  Patient left without being seen.  ABI's Rt=1.14 and Lt=1.08  (previous ABI's Rt=0.60 and Lt=1.10)  No outpatient medications have been marked as taking for the 05/06/21 encounter (Appointment) with Delana Meyer, Dolores Lory, MD.    Past Medical History:  Diagnosis Date   Anxiety    Bipolar disorder ([place])    Cancer ([place])    skin   Depression    Diabetes mellitus    Diabetes mellitus, type II ([place])    History of artificial heart valve    Schizophrenia ([place])    Vertigo     Past Surgical History:  Procedure Laterality Date   ABDOMINAL HYSTERECTOMY     BACK SURGERY     CARDIAC VALVE REPLACEMENT     CHOLECYSTECTOMY     HEMORRHOID SURGERY     LOWER EXTREMITY ANGIOGRAPHY Right 04/12/2021   Procedure: Lower Extremity Angiography;  Surgeon: [name], MD;  Location: [facility];  Service: Cardiovascular;  Laterality: Right;   TUBAL LIGATION     VISCERAL ANGIOGRAPHY N/A 11/13/2020   Procedure: VISCERAL ANGIOGRAPHY;  Surgeon: [name], MD;  Location: [facility];  Service: Cardiovascular;  Laterality: N/A;    Social History Social History   Tobacco Use   Smoking status: Every Day    Packs/day: 1.50    Types: Cigarettes   Smokeless tobacco: Never  Vaping Use   Vaping Use: Never used  Substance Use Topics   Alcohol use: Yes    Alcohol/week: 1.0 - 2.0 standard drink    Types: 1 - 2 Cans of beer per week   Drug use: Not Currently    Family History Family History  Problem Relation Age of Onset   Mental illness Mother    Breast cancer Neg Hx     Allergies  Allergen Reactions   Iodinated Diagnostic Agents Hives   Sulfa Antibiotics    Shellfish Allergy Rash and Other (See Comments)    Scallops specifically cause VOMITING Scallops specifically cause VOMITING       CBC Lab Results  Component  Value Date   WBC 8.6 11/15/2020   HGB 8.5 (L) 11/15/2020   HCT 26.5 (L) 11/15/2020   MCV 92.0 11/15/2020   PLT 324 11/15/2020    BMET    Component Value Date/Time   NA 141 11/15/2020 0539   NA 135 (L) 06/20/2012 2127   K 3.8 11/15/2020 0539   K 4.0 06/20/2012 2127   CL 115 (H) 11/15/2020 0539   CL 100 06/20/2012 2127   CO2 23 11/15/2020 0539   CO2 27 06/20/2012 2127   GLUCOSE 182 (H) 11/15/2020 0539   GLUCOSE 250 (H) 06/20/2012 2127   BUN 46 (H) 04/12/2021 1310   BUN 20 (H) 06/20/2012 2127   CREATININE 1.19 (H) 04/12/2021 1310   CREATININE 0.60 06/20/2012 2127   CALCIUM 8.3 (L) 11/15/2020 0539   CALCIUM 8.8 06/20/2012 2127   GFRNONAA 50 (L) 04/12/2021 1310   GFRNONAA >60 06/20/2012 2127   GFRAA >60 06/20/2012 2127   CrCl cannot be calculated (Patient's most recent lab result is older than the maximum 21 days allowed.).  COAG No results found for: INR, PROTIME  Radiology PERIPHERAL VASCULAR CATHETERIZATION  Result Date: 04/12/2021 See surgical note for result.  VAS [place] ABI WITH/WO TBI  Result Date: 04/11/2021  LOWER EXTREMITY DOPPLER  STUDY Patient Name:  Heather [name]  Date of Exam:   04/11/2021 Medical Rec #: [id number]    Accession #:    [id number] Date of Birth: [birth year]    Patient Gender: F Patient Age:   55 years Exam Location:  [facility] Procedure:      VAS [place] ABI WITH/WO TBI Referring Phys: Eulogio Ditch --------------------------------------------------------------------------------  Indications: Peripheral artery disease. Other Factors: Recent fall and injury to right foot.  Comparison Study: 02/2020 Performing Technologist: Concha Norway RVT  Examination Guidelines: A complete evaluation includes at minimum, Doppler waveform signals and systolic blood pressure reading at the level of bilateral brachial, anterior tibial, and posterior tibial arteries, when vessel segments are accessible. Bilateral testing is considered an integral part of a complete  examination. Photoelectric Plethysmograph (PPG) waveforms and toe systolic pressure readings are included as required and additional duplex testing as needed. Limited examinations for reoccurring indications may be performed as noted.  ABI Findings: +---------+------------------+-----+-------------------+--------+ Right    Rt Pressure (mmHg)IndexWaveform           Comment  +---------+------------------+-----+-------------------+--------+ Brachial 184                                                +---------+------------------+-----+-------------------+--------+ ATA      110                    dampened monophasic.60      +---------+------------------+-----+-------------------+--------+ PTA                             dampened monophasic         +---------+------------------+-----+-------------------+--------+ PERO                                               NonComp  +---------+------------------+-----+-------------------+--------+ Great Toe17                0.09 Abnormal                    +---------+------------------+-----+-------------------+--------+ +---------+------------------+-----+--------+-------+ Left     Lt Pressure (mmHg)IndexWaveformComment +---------+------------------+-----+--------+-------+ Brachial 178                                    +---------+------------------+-----+--------+-------+ ATA      132                    biphasic        +---------+------------------+-----+--------+-------+ PTA      202               1.10 biphasic        +---------+------------------+-----+--------+-------+ Great Toe173               0.94                 +---------+------------------+-----+--------+-------+ +-------+-----------+-----------+------------+------------+ ABI/TBIToday's ABIToday's TBIPrevious ABIPrevious TBI +-------+-----------+-----------+------------+------------+ Right  .60        .09        1.02        .81           +-------+-----------+-----------+------------+------------+ Left   1.10       .  94        1.08        .77          +-------+-----------+-----------+------------+------------+  Right ABIs and TBIs appear decreased compared to prior study on 02/2020.  Summary: Right: Resting right ankle-brachial index indicates moderate right lower extremity arterial disease. The right toe-brachial index is abnormal. Significant change from 2021 study. Added duplex images show monophasic flow from CFA to mid SFA, with dampened monophasic flow distally. Patient has had claudication symptoms for two months, then recently fell and hurt right foot. Left: Resting left ankle-brachial index is within normal range. No evidence of significant left lower extremity arterial disease. The left toe-brachial index is normal.  *See table(s) above for measurements and observations.  Electronically signed by [name] MD on 04/11/2021 at 5:14:01 PM.    Final    VAS [place] LOWER EXTREMITY ARTERIAL DUPLEX  Result Date: 04/11/2021 LOWER EXTREMITY ARTERIAL DUPLEX STUDY Patient Name:  Heather [name]  Date of Exam:   04/11/2021 Medical Rec #: [id number]    Accession #:    [id number] Date of Birth: 1951/05/03    Patient Gender: F Patient Age:   3 years Exam Location:  [facility] Procedure:      VAS [place] LOWER EXTREMITY ARTERIAL DUPLEX Referring Phys: Eulogio Ditch --------------------------------------------------------------------------------  Indications: Peripheral artery disease, and Right leg claudication x 2 weeks,              recent fall and injured right foot.  Current ABI: rt = .60 Performing Technologist: Concha Norway RVT  Examination Guidelines: A complete evaluation includes B-mode imaging, spectral Doppler, color Doppler, and power Doppler as needed of all accessible portions of each vessel. Bilateral testing is considered an integral part of a complete examination. Limited examinations for reoccurring indications may be performed  as noted.  +----------+--------+-----+--------+-------------------+--------+ RIGHT     PSV cm/sRatioStenosisWaveform           Comments +----------+--------+-----+--------+-------------------+--------+ CFA Mid   131                  monophasic                  +----------+--------+-----+--------+-------------------+--------+ DFA       130                  monophasic                  +----------+--------+-----+--------+-------------------+--------+ SFA Prox  119                  monophasic                  +----------+--------+-----+--------+-------------------+--------+ SFA Mid   68                   dampened monophasic         +----------+--------+-----+--------+-------------------+--------+ SFA Distal43                   dampened monophasic         +----------+--------+-----+--------+-------------------+--------+ POP Distal69                   dampened monophasic         +----------+--------+-----+--------+-------------------+--------+ ATA Distal36                   dampened monophasic         +----------+--------+-----+--------+-------------------+--------+ PTA Distal32  dampened monophasic         +----------+--------+-----+--------+-------------------+--------+  Summary: Right: Moderate atherosclerosis and small caliber native arteries seen. Vessels are patent with monophasic flow throughout suggesting inflow disease. Damoened monophasic flow from distal SFA distally.  See table(s) above for measurements and observations. Electronically signed by [name] MD on 04/11/2021 at 5:13:31 PM.    Final      Assessment/Plan 1. Atherosclerosis of native artery of right lower extremity with rest pain [facility]) Patient left without being seen   [name], MD  05/06/2021 8:55 AM

## 2021-05-13 ENCOUNTER — Other Ambulatory Visit: Payer: Self-pay

## 2021-05-13 ENCOUNTER — Ambulatory Visit (INDEPENDENT_AMBULATORY_CARE_PROVIDER_SITE_OTHER): Payer: Medicare HMO | Admitting: Vascular Surgery

## 2021-05-13 VITALS — BP 120/75 | HR 64 | Ht 59.0 in | Wt 120.0 lb

## 2021-05-13 DIAGNOSIS — I70213 Atherosclerosis of native arteries of extremities with intermittent claudication, bilateral legs: Secondary | ICD-10-CM

## 2021-05-13 DIAGNOSIS — I10 Essential (primary) hypertension: Secondary | ICD-10-CM

## 2021-05-13 DIAGNOSIS — I6523 Occlusion and stenosis of bilateral carotid arteries: Secondary | ICD-10-CM | POA: Diagnosis not present

## 2021-05-13 DIAGNOSIS — E782 Mixed hyperlipidemia: Secondary | ICD-10-CM

## 2021-05-13 DIAGNOSIS — J439 Emphysema, unspecified: Secondary | ICD-10-CM

## 2021-05-13 DIAGNOSIS — I25118 Atherosclerotic heart disease of native coronary artery with other forms of angina pectoris: Secondary | ICD-10-CM | POA: Diagnosis not present

## 2021-05-13 NOTE — H&P (View-Only) (Signed)
MRN : [id number]  Heather [name] is a 70 y.o. (06-14-1951) female who presents with chief complaint of check my leg.  History of Present Illness:   The patient returns to the office for followup and review status post angiogram with intervention 04/12/2021.   Procedure:  Percutaneous transluminal angioplasty and stent placement right superficial femoral artery and popliteal  The patient notes improvement in the right lower extremity symptoms.  However, she is now having severe symptoms of her left lower extremity that include both mild rest pain as well as lifestyle limitation.  No interval shortening of the patient's right leg claudication distance or rest pain symptoms. Previous wounds have now healed.  No new ulcers or wounds have occurred since the last visit.  There have been no significant changes to the patient's overall health care.  The patient denies amaurosis fugax or recent TIA symptoms. There are no recent neurological changes noted. The patient denies history of DVT, PE or superficial thrombophlebitis. The patient denies recent episodes of angina or shortness of breath.   ABI's Rt=1.14 and Lt=1.08  (previous ABI's Rt=0.60 and Lt=1.10)   No outpatient medications have been marked as taking for the 05/13/21 encounter (Appointment) with Delana Meyer, Dolores Lory, MD.    Past Medical History:  Diagnosis Date   Anxiety    Bipolar disorder ([place])    Cancer ([place])    skin   Depression    Diabetes mellitus    Diabetes mellitus, type II ([place])    History of artificial heart valve    Schizophrenia ([place])    Vertigo     Past Surgical History:  Procedure Laterality Date   ABDOMINAL HYSTERECTOMY     BACK SURGERY     CARDIAC VALVE REPLACEMENT     CHOLECYSTECTOMY     HEMORRHOID SURGERY     LOWER EXTREMITY ANGIOGRAPHY Right 04/12/2021   Procedure: Lower Extremity Angiography;  Surgeon: [name], MD;  Location: [facility];  Service: Cardiovascular;  Laterality:  Right;   TUBAL LIGATION     VISCERAL ANGIOGRAPHY N/A 11/13/2020   Procedure: VISCERAL ANGIOGRAPHY;  Surgeon: [name], MD;  Location: [facility];  Service: Cardiovascular;  Laterality: N/A;    Social History Social History   Tobacco Use   Smoking status: Every Day    Packs/day: 1.50    Types: Cigarettes   Smokeless tobacco: Never  Vaping Use   Vaping Use: Never used  Substance Use Topics   Alcohol use: Yes    Alcohol/week: 1.0 - 2.0 standard drink    Types: 1 - 2 Cans of beer per week   Drug use: Not Currently    Family History Family History  Problem Relation Age of Onset   Mental illness Mother    Breast cancer Neg Hx     Allergies  Allergen Reactions   Iodinated Diagnostic Agents Hives   Sulfa Antibiotics    Shellfish Allergy Rash and Other (See Comments)    Scallops specifically cause VOMITING Scallops specifically cause VOMITING      REVIEW OF SYSTEMS (Negative unless checked)  Constitutional: '[]'$ Weight loss  '[]'$ Fever  '[]'$ Chills Cardiac: '[]'$ Chest pain   '[]'$ Chest pressure   '[]'$ Palpitations   '[]'$ Shortness of breath when laying flat   '[]'$ Shortness of breath with exertion. Vascular:  '[x]'$ Pain in legs with walking   '[x]'$ Pain in legs at rest  '[]'$ History of DVT   '[]'$ Phlebitis   '[]'$ Swelling in legs   '[]'$ Varicose veins   '[]'$ Non-healing ulcers Pulmonary:   '[]'$   Uses home oxygen   '[]'$ Productive cough   '[]'$ Hemoptysis   '[]'$ Wheeze  '[]'$ COPD   '[]'$ Asthma Neurologic:  '[]'$ Dizziness   '[]'$ Seizures   '[]'$ History of stroke   '[]'$ History of TIA  '[]'$ Aphasia   '[]'$ Vissual changes   '[]'$ Weakness or numbness in arm   '[]'$ Weakness or numbness in leg Musculoskeletal:   '[]'$ Joint swelling   '[x]'$ Joint pain   '[]'$ Low back pain Hematologic:  '[]'$ Easy bruising  '[]'$ Easy bleeding   '[]'$ Hypercoagulable state   '[]'$ Anemic Gastrointestinal:  '[]'$ Diarrhea   '[]'$ Vomiting  '[]'$ Gastroesophageal reflux/heartburn   '[]'$ Difficulty swallowing. Genitourinary:  '[]'$ Chronic kidney disease   '[]'$ Difficult urination  '[]'$ Frequent urination   '[]'$ Blood in  urine Skin:  '[]'$ Rashes   '[]'$ Ulcers  Psychological:  '[]'$ History of anxiety   '[]'$  History of major depression.  Physical Examination  There were no vitals filed for this visit. There is no height or weight on file to calculate BMI. Gen: WD/WN, NAD Head: [place]/AT, No temporalis wasting.  Ear/Nose/Throat: Hearing grossly intact, nares w/o erythema or drainage Eyes: PER, EOMI, sclera nonicteric.  Neck: Supple, no masses.  No bruit or JVD.  Pulmonary:  Good air movement, no audible wheezing, no use of accessory muscles.  Cardiac: RRR, normal S1, S2, no Murmurs. Vascular:   Vessel Right Left  Radial Palpable Palpable  PT Trace Palpable Not Palpable  DP Trace Palpable Not Palpable  Gastrointestinal: soft, non-distended. No guarding/no peritoneal signs.  Musculoskeletal: M/S 5/5 throughout.  No visible deformity.  Neurologic: CN 2-12 intact. Pain and light touch intact in extremities.  Symmetrical.  Speech is fluent. Motor exam as listed above. Psychiatric: Judgment intact, Mood & affect appropriate for pt's clinical situation. Dermatologic: No rashes or ulcers noted.  No changes consistent with cellulitis.   CBC Lab Results  Component Value Date   WBC 8.6 11/15/2020   HGB 8.5 (L) 11/15/2020   HCT 26.5 (L) 11/15/2020   MCV 92.0 11/15/2020   PLT 324 11/15/2020    BMET    Component Value Date/Time   NA 141 11/15/2020 0539   NA 135 (L) 06/20/2012 2127   K 3.8 11/15/2020 0539   K 4.0 06/20/2012 2127   CL 115 (H) 11/15/2020 0539   CL 100 06/20/2012 2127   CO2 23 11/15/2020 0539   CO2 27 06/20/2012 2127   GLUCOSE 182 (H) 11/15/2020 0539   GLUCOSE 250 (H) 06/20/2012 2127   BUN 46 (H) 04/12/2021 1310   BUN 20 (H) 06/20/2012 2127   CREATININE 1.19 (H) 04/12/2021 1310   CREATININE 0.60 06/20/2012 2127   CALCIUM 8.3 (L) 11/15/2020 0539   CALCIUM 8.8 06/20/2012 2127   GFRNONAA 50 (L) 04/12/2021 1310   GFRNONAA >60 06/20/2012 2127   GFRAA >60 06/20/2012 2127   CrCl cannot be calculated  (Patient's most recent lab result is older than the maximum 21 days allowed.).  COAG No results found for: INR, PROTIME  Radiology VAS [place] ABI WITH/WO TBI  Result Date: 05/09/2021  LOWER EXTREMITY DOPPLER STUDY Patient Name:  TANECIA HIGHSMITH  Date of Exam:   05/06/2021 Medical Rec #: [id number]    Accession #:    [id number] Date of Birth: 07/01/1951    Patient Gender: F Patient Age:   30 years Exam Location:  [facility] Procedure:      VAS [place] ABI WITH/WO TBI Referring Phys: [facility] --------------------------------------------------------------------------------  Indications: Peripheral artery disease. Other Factors: Recent fall and injury to right foot  04/12/2020 rt sfa-pop PTA and stents.  Comparison Study: 04/11/2021 Performing Technologist: Concha Norway RVT  Examination Guidelines: A complete evaluation includes at minimum, Doppler waveform signals and systolic blood pressure reading at the level of bilateral brachial, anterior tibial, and posterior tibial arteries, when vessel segments are accessible. Bilateral testing is considered an integral part of a complete examination. Photoelectric Plethysmograph (PPG) waveforms and toe systolic pressure readings are included as required and additional duplex testing as needed. Limited examinations for reoccurring indications may be performed as noted.  ABI Findings: +---------+------------------+-----+---------+--------+ Right    Rt Pressure (mmHg)IndexWaveform Comment  +---------+------------------+-----+---------+--------+ Brachial 154                                      +---------+------------------+-----+---------+--------+ ATA      172                    triphasic1.12     +---------+------------------+-----+---------+--------+ PTA      176               1.14 biphasic          +---------+------------------+-----+---------+--------+ Great Toe97                0.63 Normal             +---------+------------------+-----+---------+--------+ +---------+------------------+-----+--------+-------+ Left     Lt Pressure (mmHg)IndexWaveformComment +---------+------------------+-----+--------+-------+ ATA      96                     biphasic.62     +---------+------------------+-----+--------+-------+ PTA      167               1.08 biphasic        +---------+------------------+-----+--------+-------+ Great Toe78                0.51 Abnormal        +---------+------------------+-----+--------+-------+ +-------+-----------+-----------+------------+------------+ ABI/TBIToday's ABIToday's TBIPrevious ABIPrevious TBI +-------+-----------+-----------+------------+------------+ Right  1.14       .63        .60         .09          +-------+-----------+-----------+------------+------------+ Left   1.08       .62        1.10        .77          +-------+-----------+-----------+------------+------------+  Right ABIs appear increased compared to prior study on 04/11/2021.  Summary: Right: Resting right ankle-brachial index is within normal range. No evidence of significant right lower extremity arterial disease. The right toe-brachial index is normal. Left: Resting left ankle-brachial index is within normal range. No evidence of significant left lower extremity arterial disease. The left toe-brachial index is abnormal.  *See table(s) above for measurements and observations.  Electronically signed by [name] MD on 05/09/2021 at 7:08:31 PM.    Final      Assessment/Plan 1. Atherosclerosis of native artery of both lower extremities with intermittent claudication (HCC) Recommend:  Although the patient has had a successful intervention of the right lower extremity she is now noting a severe worsening of severe atherosclerotic changes of left lower extremities with rest pain that is associated with preulcerative changes and impending tissue loss of the foot.  This  represents a left limb threatening ischemia and places the patient at the risk for left limb loss.  Patient should undergo angiography of the left  lower extremities with the hope for intervention for limb salvage.  The risks and benefits as well as the alternative therapies was discussed in detail with the patient.  All questions were answered.  Patient agrees to proceed with left leg angiography.  The patient will follow up with me in the office after the procedure.    2. Bilateral carotid artery stenosis Recommend:  Given the patient's asymptomatic subcritical stenosis no further invasive testing or surgery at this time.  Duplex ultrasound shows 1 to 39% RICA stenosis and a 40 to [id number] LICA stenosis.  Continue antiplatelet therapy as prescribed Continue management of CAD, HTN and Hyperlipidemia Healthy heart diet,  encouraged exercise at least 4 times per week Follow up in 12 months with duplex ultrasound and physical exam    3. Coronary artery disease of native artery of native heart with stable angina pectoris (HCC) Continue cardiac and antihypertensive medications as already ordered and reviewed, no changes at this time.  Continue statin as ordered and reviewed, no changes at this time  Nitrates PRN for chest pain   4. Hypertension, benign Continue antihypertensive medications as already ordered, these medications have been reviewed and there are no changes at this time.   5. Pulmonary emphysema, unspecified emphysema type ([place]) Continue pulmonary medications and aerosols as already ordered, these medications have been reviewed and there are no changes at this time.    6. Mixed hyperlipidemia Continue statin as ordered and reviewed, no changes at this time     [name], MD  05/13/2021 12:58 PM

## 2021-05-13 NOTE — Progress Notes (Signed)
MRN : [id number]  Heather [name] is a 70 y.o. (1951/03/17) female who presents with chief complaint of check my leg.  History of Present Illness:   The patient returns to the office for followup and review status post angiogram with intervention 04/12/2021.   Procedure:  Percutaneous transluminal angioplasty and stent placement right superficial femoral artery and popliteal  The patient notes improvement in the right lower extremity symptoms.  However, she is now having severe symptoms of her left lower extremity that include both mild rest pain as well as lifestyle limitation.  No interval shortening of the patient's right leg claudication distance or rest pain symptoms. Previous wounds have now healed.  No new ulcers or wounds have occurred since the last visit.  There have been no significant changes to the patient's overall health care.  The patient denies amaurosis fugax or recent TIA symptoms. There are no recent neurological changes noted. The patient denies history of DVT, PE or superficial thrombophlebitis. The patient denies recent episodes of angina or shortness of breath.   ABI's Rt=1.14 and Lt=1.08  (previous ABI's Rt=0.60 and Lt=1.10)   No outpatient medications have been marked as taking for the 05/13/21 encounter (Appointment) with Delana Meyer, Dolores Lory, MD.    Past Medical History:  Diagnosis Date   Anxiety    Bipolar disorder ([place])    Cancer ([place])    skin   Depression    Diabetes mellitus    Diabetes mellitus, type II ([place])    History of artificial heart valve    Schizophrenia ([place])    Vertigo     Past Surgical History:  Procedure Laterality Date   ABDOMINAL HYSTERECTOMY     BACK SURGERY     CARDIAC VALVE REPLACEMENT     CHOLECYSTECTOMY     HEMORRHOID SURGERY     LOWER EXTREMITY ANGIOGRAPHY Right 04/12/2021   Procedure: Lower Extremity Angiography;  Surgeon: [name], MD;  Location: [facility];  Service: Cardiovascular;  Laterality:  Right;   TUBAL LIGATION     VISCERAL ANGIOGRAPHY N/A 11/13/2020   Procedure: VISCERAL ANGIOGRAPHY;  Surgeon: [name], MD;  Location: [facility];  Service: Cardiovascular;  Laterality: N/A;    Social History Social History   Tobacco Use   Smoking status: Every Day    Packs/day: 1.50    Types: Cigarettes   Smokeless tobacco: Never  Vaping Use   Vaping Use: Never used  Substance Use Topics   Alcohol use: Yes    Alcohol/week: 1.0 - 2.0 standard drink    Types: 1 - 2 Cans of beer per week   Drug use: Not Currently    Family History Family History  Problem Relation Age of Onset   Mental illness Mother    Breast cancer Neg Hx     Allergies  Allergen Reactions   Iodinated Diagnostic Agents Hives   Sulfa Antibiotics    Shellfish Allergy Rash and Other (See Comments)    Scallops specifically cause VOMITING Scallops specifically cause VOMITING      REVIEW OF SYSTEMS (Negative unless checked)  Constitutional: '[]'$ Weight loss  '[]'$ Fever  '[]'$ Chills Cardiac: '[]'$ Chest pain   '[]'$ Chest pressure   '[]'$ Palpitations   '[]'$ Shortness of breath when laying flat   '[]'$ Shortness of breath with exertion. Vascular:  '[x]'$ Pain in legs with walking   '[x]'$ Pain in legs at rest  '[]'$ History of DVT   '[]'$ Phlebitis   '[]'$ Swelling in legs   '[]'$ Varicose veins   '[]'$ Non-healing ulcers Pulmonary:   '[]'$   Uses home oxygen   '[]'$ Productive cough   '[]'$ Hemoptysis   '[]'$ Wheeze  '[]'$ COPD   '[]'$ Asthma Neurologic:  '[]'$ Dizziness   '[]'$ Seizures   '[]'$ History of stroke   '[]'$ History of TIA  '[]'$ Aphasia   '[]'$ Vissual changes   '[]'$ Weakness or numbness in arm   '[]'$ Weakness or numbness in leg Musculoskeletal:   '[]'$ Joint swelling   '[x]'$ Joint pain   '[]'$ Low back pain Hematologic:  '[]'$ Easy bruising  '[]'$ Easy bleeding   '[]'$ Hypercoagulable state   '[]'$ Anemic Gastrointestinal:  '[]'$ Diarrhea   '[]'$ Vomiting  '[]'$ Gastroesophageal reflux/heartburn   '[]'$ Difficulty swallowing. Genitourinary:  '[]'$ Chronic kidney disease   '[]'$ Difficult urination  '[]'$ Frequent urination   '[]'$ Blood in  urine Skin:  '[]'$ Rashes   '[]'$ Ulcers  Psychological:  '[]'$ History of anxiety   '[]'$  History of major depression.  Physical Examination  There were no vitals filed for this visit. There is no height or weight on file to calculate BMI. Gen: WD/WN, NAD Head: [place]/AT, No temporalis wasting.  Ear/Nose/Throat: Hearing grossly intact, nares w/o erythema or drainage Eyes: PER, EOMI, sclera nonicteric.  Neck: Supple, no masses.  No bruit or JVD.  Pulmonary:  Good air movement, no audible wheezing, no use of accessory muscles.  Cardiac: RRR, normal S1, S2, no Murmurs. Vascular:   Vessel Right Left  Radial Palpable Palpable  PT Trace Palpable Not Palpable  DP Trace Palpable Not Palpable  Gastrointestinal: soft, non-distended. No guarding/no peritoneal signs.  Musculoskeletal: M/S 5/5 throughout.  No visible deformity.  Neurologic: CN 2-12 intact. Pain and light touch intact in extremities.  Symmetrical.  Speech is fluent. Motor exam as listed above. Psychiatric: Judgment intact, Mood & affect appropriate for pt's clinical situation. Dermatologic: No rashes or ulcers noted.  No changes consistent with cellulitis.   CBC Lab Results  Component Value Date   WBC 8.6 11/15/2020   HGB 8.5 (L) 11/15/2020   HCT 26.5 (L) 11/15/2020   MCV 92.0 11/15/2020   PLT 324 11/15/2020    BMET    Component Value Date/Time   NA 141 11/15/2020 0539   NA 135 (L) 06/20/2012 2127   K 3.8 11/15/2020 0539   K 4.0 06/20/2012 2127   CL 115 (H) 11/15/2020 0539   CL 100 06/20/2012 2127   CO2 23 11/15/2020 0539   CO2 27 06/20/2012 2127   GLUCOSE 182 (H) 11/15/2020 0539   GLUCOSE 250 (H) 06/20/2012 2127   BUN 46 (H) 04/12/2021 1310   BUN 20 (H) 06/20/2012 2127   CREATININE 1.19 (H) 04/12/2021 1310   CREATININE 0.60 06/20/2012 2127   CALCIUM 8.3 (L) 11/15/2020 0539   CALCIUM 8.8 06/20/2012 2127   GFRNONAA 50 (L) 04/12/2021 1310   GFRNONAA >60 06/20/2012 2127   GFRAA >60 06/20/2012 2127   CrCl cannot be calculated  (Patient's most recent lab result is older than the maximum 21 days allowed.).  COAG No results found for: INR, PROTIME  Radiology VAS [place] ABI WITH/WO TBI  Result Date: 05/09/2021  LOWER EXTREMITY DOPPLER STUDY Patient Name:  CHARIDY HELMUS  Date of Exam:   05/06/2021 Medical Rec #: [id number]    Accession #:    [id number] Date of Birth: Nov 04, 1950    Patient Gender: F Patient Age:   70 years [facility] Procedure:      VAS [place] ABI WITH/WO TBI Referring Phys: [facility] --------------------------------------------------------------------------------  Indications: Peripheral artery disease. Other Factors: Recent fall and injury to right foot  04/12/2020 rt sfa-pop PTA and stents.  Comparison Study: 04/11/2021 Performing Technologist: Concha Norway RVT  Examination Guidelines: A complete evaluation includes at minimum, Doppler waveform signals and systolic blood pressure reading at the level of bilateral brachial, anterior tibial, and posterior tibial arteries, when vessel segments are accessible. Bilateral testing is considered an integral part of a complete examination. Photoelectric Plethysmograph (PPG) waveforms and toe systolic pressure readings are included as required and additional duplex testing as needed. Limited examinations for reoccurring indications may be performed as noted.  ABI Findings: +---------+------------------+-----+---------+--------+ Right    Rt Pressure (mmHg)IndexWaveform Comment  +---------+------------------+-----+---------+--------+ Brachial 154                                      +---------+------------------+-----+---------+--------+ ATA      172                    triphasic1.12     +---------+------------------+-----+---------+--------+ PTA      176               1.14 biphasic          +---------+------------------+-----+---------+--------+ Great Toe97                0.63 Normal             +---------+------------------+-----+---------+--------+ +---------+------------------+-----+--------+-------+ Left     Lt Pressure (mmHg)IndexWaveformComment +---------+------------------+-----+--------+-------+ ATA      96                     biphasic.62     +---------+------------------+-----+--------+-------+ PTA      167               1.08 biphasic        +---------+------------------+-----+--------+-------+ Great Toe78                0.51 Abnormal        +---------+------------------+-----+--------+-------+ +-------+-----------+-----------+------------+------------+ ABI/TBIToday's ABIToday's TBIPrevious ABIPrevious TBI +-------+-----------+-----------+------------+------------+ Right  1.14       .63        .60         .09          +-------+-----------+-----------+------------+------------+ Left   1.08       .62        1.10        .77          +-------+-----------+-----------+------------+------------+  Right ABIs appear increased compared to prior study on 04/11/2021.  Summary: Right: Resting right ankle-brachial index is within normal range. No evidence of significant right lower extremity arterial disease. The right toe-brachial index is normal. Left: Resting left ankle-brachial index is within normal range. No evidence of significant left lower extremity arterial disease. The left toe-brachial index is abnormal.  *See table(s) above for measurements and observations.  Electronically signed by [name] MD on 05/09/2021 at 7:08:31 PM.    Final      Assessment/Plan 1. Atherosclerosis of native artery of both lower extremities with intermittent claudication (HCC) Recommend:  Although the patient has had a successful intervention of the right lower extremity she is now noting a severe worsening of severe atherosclerotic changes of left lower extremities with rest pain that is associated with preulcerative changes and impending tissue loss of the foot.  This  represents a left limb threatening ischemia and places the patient at the risk for left limb loss.  Patient should undergo angiography of the left  lower extremities with the hope for intervention for limb salvage.  The risks and benefits as well as the alternative therapies was discussed in detail with the patient.  All questions were answered.  Patient agrees to proceed with left leg angiography.  The patient will follow up with me in the office after the procedure.    2. Bilateral carotid artery stenosis Recommend:  Given the patient's asymptomatic subcritical stenosis no further invasive testing or surgery at this time.  Duplex ultrasound shows 1 to 39% RICA stenosis and a 40 to [id number] LICA stenosis.  Continue antiplatelet therapy as prescribed Continue management of CAD, HTN and Hyperlipidemia Healthy heart diet,  encouraged exercise at least 4 times per week Follow up in 12 months with duplex ultrasound and physical exam    3. Coronary artery disease of native artery of native heart with stable angina pectoris (HCC) Continue cardiac and antihypertensive medications as already ordered and reviewed, no changes at this time.  Continue statin as ordered and reviewed, no changes at this time  Nitrates PRN for chest pain   4. Hypertension, benign Continue antihypertensive medications as already ordered, these medications have been reviewed and there are no changes at this time.   5. Pulmonary emphysema, unspecified emphysema type ([place]) Continue pulmonary medications and aerosols as already ordered, these medications have been reviewed and there are no changes at this time.    6. Mixed hyperlipidemia Continue statin as ordered and reviewed, no changes at this time     [name], MD  05/13/2021 12:58 PM

## 2021-05-15 ENCOUNTER — Encounter (INDEPENDENT_AMBULATORY_CARE_PROVIDER_SITE_OTHER): Payer: Self-pay | Admitting: Vascular Surgery

## 2021-05-15 ENCOUNTER — Telehealth (INDEPENDENT_AMBULATORY_CARE_PROVIDER_SITE_OTHER): Payer: Self-pay

## 2021-05-15 NOTE — Telephone Encounter (Signed)
Reach out to the patient to schedule for left le  angio with Dr Delana Meyer. I left a message on patient voicemail to return call to the office.

## 2021-05-16 ENCOUNTER — Encounter (INDEPENDENT_AMBULATORY_CARE_PROVIDER_SITE_OTHER): Payer: Self-pay

## 2021-05-16 ENCOUNTER — Other Ambulatory Visit: Payer: Self-pay | Admitting: Nurse Practitioner

## 2021-05-16 DIAGNOSIS — Z1231 Encounter for screening mammogram for malignant neoplasm of breast: Secondary | ICD-10-CM

## 2021-05-16 DIAGNOSIS — Z13820 Encounter for screening for osteoporosis: Secondary | ICD-10-CM

## 2021-05-16 NOTE — Telephone Encounter (Signed)
Patient return call and is schedule left lower extremity angio with intervention with dr Delana Meyer. Patient is schedule for 05/21/21 arrival time 9:15 am at [facility]. Pre-procedure instructions were gone over with the patient and will mailed out.

## 2021-05-21 ENCOUNTER — Other Ambulatory Visit (INDEPENDENT_AMBULATORY_CARE_PROVIDER_SITE_OTHER): Payer: Self-pay | Admitting: Nurse Practitioner

## 2021-05-21 ENCOUNTER — Encounter (INDEPENDENT_AMBULATORY_CARE_PROVIDER_SITE_OTHER): Payer: Self-pay

## 2021-05-21 ENCOUNTER — Telehealth (INDEPENDENT_AMBULATORY_CARE_PROVIDER_SITE_OTHER): Payer: Self-pay | Admitting: Vascular Surgery

## 2021-05-21 DIAGNOSIS — I70219 Atherosclerosis of native arteries of extremities with intermittent claudication, unspecified extremity: Secondary | ICD-10-CM

## 2021-05-21 MED ORDER — OXYCODONE-ACETAMINOPHEN 5-325 MG PO TABS: 1.0000 | ORAL_TABLET | Freq: Four times a day (QID) | ORAL | 0 refills | Status: DC | PRN

## 2021-05-21 NOTE — Telephone Encounter (Signed)
Called to request pain medication. Patient was scheduled to have surgery today but did not show. Patient is waiting to be r/s'd. Please advise.

## 2021-05-21 NOTE — Telephone Encounter (Signed)
Patient has been reschedule for 05/28/21 arrival time 8:30 am at the [facility]. Patient is aware that something will be sent into pharmacy for pain and stated to use [facility]

## 2021-05-21 NOTE — Telephone Encounter (Signed)
Refill sent.

## 2021-05-27 ENCOUNTER — Other Ambulatory Visit (INDEPENDENT_AMBULATORY_CARE_PROVIDER_SITE_OTHER): Payer: Self-pay | Admitting: Nurse Practitioner

## 2021-05-28 ENCOUNTER — Encounter: Payer: Self-pay | Admitting: Vascular Surgery

## 2021-05-28 ENCOUNTER — Ambulatory Visit
Admission: RE | Admit: 2021-05-28 | Discharge: 2021-05-28 | Disposition: A | Discharge status: Home / Self Care | Payer: Medicare HMO | Attending: Vascular Surgery | Admitting: Vascular Surgery

## 2021-05-28 ENCOUNTER — Encounter
Admission: RE | Disposition: A | Discharge status: Home / Self Care | Payer: Self-pay | Source: Home / Self Care | Attending: Vascular Surgery

## 2021-05-28 DIAGNOSIS — I70223 Atherosclerosis of native arteries of extremities with rest pain, bilateral legs: Secondary | ICD-10-CM | POA: Diagnosis not present

## 2021-05-28 DIAGNOSIS — I10 Essential (primary) hypertension: Secondary | ICD-10-CM | POA: Diagnosis not present

## 2021-05-28 DIAGNOSIS — F17210 Nicotine dependence, cigarettes, uncomplicated: Secondary | ICD-10-CM | POA: Insufficient documentation

## 2021-05-28 DIAGNOSIS — I70213 Atherosclerosis of native arteries of extremities with intermittent claudication, bilateral legs: Secondary | ICD-10-CM | POA: Diagnosis not present

## 2021-05-28 DIAGNOSIS — Z882 Allergy status to sulfonamides status: Secondary | ICD-10-CM | POA: Insufficient documentation

## 2021-05-28 DIAGNOSIS — E782 Mixed hyperlipidemia: Secondary | ICD-10-CM | POA: Insufficient documentation

## 2021-05-28 DIAGNOSIS — I25118 Atherosclerotic heart disease of native coronary artery with other forms of angina pectoris: Secondary | ICD-10-CM | POA: Insufficient documentation

## 2021-05-28 DIAGNOSIS — J439 Emphysema, unspecified: Secondary | ICD-10-CM | POA: Insufficient documentation

## 2021-05-28 DIAGNOSIS — E1151 Type 2 diabetes mellitus with diabetic peripheral angiopathy without gangrene: Secondary | ICD-10-CM | POA: Insufficient documentation

## 2021-05-28 DIAGNOSIS — I6523 Occlusion and stenosis of bilateral carotid arteries: Secondary | ICD-10-CM | POA: Diagnosis not present

## 2021-05-28 DIAGNOSIS — I70219 Atherosclerosis of native arteries of extremities with intermittent claudication, unspecified extremity: Secondary | ICD-10-CM

## 2021-05-28 HISTORY — PX: LOWER EXTREMITY ANGIOGRAPHY: CATH118251

## 2021-05-28 LAB — CREATININE, SERUM
Creatinine, Ser: 0.93 mg/dL (ref 0.44–1.00)
GFR, Estimated: >60 mL/min (ref >60)

## 2021-05-28 LAB — BUN: BUN: 33 mg/dL — ABNORMAL HIGH (ref 8–23)

## 2021-05-28 LAB — GLUCOSE, CAPILLARY
Glucose-Capillary: 207 mg/dL — ABNORMAL HIGH (ref 70–99)
Glucose-Capillary: 231 mg/dL — ABNORMAL HIGH (ref 70–99)

## 2021-05-28 SURGERY — LOWER EXTREMITY ANGIOGRAPHY
Anesthesia: Moderate Sedation | Site: Leg Lower | Laterality: Left

## 2021-05-28 MED ORDER — FAMOTIDINE 20 MG PO TABS: 40.0000 mg | ORAL_TABLET | Freq: Once | ORAL | Status: AC | PRN

## 2021-05-28 MED ORDER — SODIUM CHLORIDE 0.9% FLUSH: 3.0000 mL | INTRAVENOUS | Status: DC | PRN

## 2021-05-28 MED ORDER — IODIXANOL 320 MG/ML IV SOLN
INTRAVENOUS | Status: DC | PRN
  Administered 2021-05-28: 65 mL via INTRA_ARTERIAL

## 2021-05-28 MED ORDER — LABETALOL HCL 5 MG/ML IV SOLN
10.0000 mg | INTRAVENOUS | Status: DC | PRN
Start: 2021-05-28 — End: 2021-05-28

## 2021-05-28 MED ORDER — SODIUM CHLORIDE 0.9 % IV SOLN: 250.0000 mL | INTRAVENOUS | Status: DC | PRN

## 2021-05-28 MED ORDER — SODIUM CHLORIDE 0.9 % IV SOLN: INTRAVENOUS | Status: DC

## 2021-05-28 MED ORDER — FENTANYL CITRATE PF 50 MCG/ML IJ SOSY
PREFILLED_SYRINGE | INTRAMUSCULAR | Status: AC
  Filled 2021-05-28: qty 1

## 2021-05-28 MED ORDER — CEFAZOLIN SODIUM-DEXTROSE 2-4 GM/100ML-% IV SOLN
INTRAVENOUS | Status: AC
  Administered 2021-05-28: 2 g via INTRAVENOUS
  Filled 2021-05-28: qty 100

## 2021-05-28 MED ORDER — HEPARIN SODIUM (PORCINE) 1000 UNIT/ML IJ SOLN
INTRAMUSCULAR | Status: DC | PRN
  Administered 2021-05-28: 4000 [IU] via INTRAVENOUS

## 2021-05-28 MED ORDER — FAMOTIDINE 20 MG PO TABS
ORAL_TABLET | ORAL | Status: AC
  Administered 2021-05-28: 40 mg via ORAL
  Filled 2021-05-28: qty 2

## 2021-05-28 MED ORDER — HYDROMORPHONE HCL 1 MG/ML IJ SOLN
1.0000 mg | Freq: Once | INTRAMUSCULAR | Status: DC | PRN
Start: 2021-05-28 — End: 2021-05-28

## 2021-05-28 MED ORDER — METHYLPREDNISOLONE SODIUM SUCC 125 MG IJ SOLR
INTRAMUSCULAR | Status: AC
  Administered 2021-05-28: 125 mg via INTRAVENOUS
  Filled 2021-05-28: qty 2

## 2021-05-28 MED ORDER — MIDAZOLAM HCL 5 MG/5ML IJ SOLN
INTRAMUSCULAR | Status: AC
  Filled 2021-05-28: qty 5

## 2021-05-28 MED ORDER — HYDRALAZINE HCL 20 MG/ML IJ SOLN: 5.0000 mg | INTRAMUSCULAR | Status: DC | PRN

## 2021-05-28 MED ORDER — CEFAZOLIN SODIUM-DEXTROSE 2-4 GM/100ML-% IV SOLN: 2.0000 g | Freq: Once | INTRAVENOUS | Status: AC

## 2021-05-28 MED ORDER — DIPHENHYDRAMINE HCL 50 MG/ML IJ SOLN: 50.0000 mg | Freq: Once | INTRAMUSCULAR | Status: AC | PRN

## 2021-05-28 MED ORDER — SODIUM CHLORIDE 0.9% FLUSH: 3.0000 mL | Freq: Two times a day (BID) | INTRAVENOUS | Status: DC

## 2021-05-28 MED ORDER — OXYCODONE HCL 5 MG PO TABS: 5.0000 mg | ORAL_TABLET | ORAL | Status: DC | PRN

## 2021-05-28 MED ORDER — ONDANSETRON HCL 4 MG/2ML IJ SOLN: 4.0000 mg | Freq: Four times a day (QID) | INTRAMUSCULAR | Status: DC | PRN

## 2021-05-28 MED ORDER — FENTANYL CITRATE (PF) 100 MCG/2ML IJ SOLN
INTRAMUSCULAR | Status: DC | PRN
  Administered 2021-05-28: 50 ug via INTRAVENOUS
  Administered 2021-05-28: 25 ug via INTRAVENOUS
  Administered 2021-05-28: 50 ug via INTRAVENOUS

## 2021-05-28 MED ORDER — DIPHENHYDRAMINE HCL 50 MG/ML IJ SOLN
INTRAMUSCULAR | Status: AC
  Administered 2021-05-28: 50 mg via INTRAVENOUS
  Filled 2021-05-28: qty 1

## 2021-05-28 MED ORDER — METHYLPREDNISOLONE SODIUM SUCC 125 MG IJ SOLR: 125.0000 mg | Freq: Once | INTRAMUSCULAR | Status: AC | PRN

## 2021-05-28 MED ORDER — MIDAZOLAM HCL 2 MG/2ML IJ SOLN
INTRAMUSCULAR | Status: DC | PRN
  Administered 2021-05-28: 2 mg via INTRAVENOUS
  Administered 2021-05-28: 1 mg via INTRAVENOUS
  Administered 2021-05-28: 2 mg via INTRAVENOUS

## 2021-05-28 MED ORDER — ACETAMINOPHEN 325 MG PO TABS: 650.0000 mg | ORAL_TABLET | ORAL | Status: DC | PRN

## 2021-05-28 MED ORDER — MIDAZOLAM HCL 2 MG/ML PO SYRP: 8.0000 mg | ORAL_SOLUTION | Freq: Once | ORAL | Status: DC | PRN

## 2021-05-28 MED ORDER — HEPARIN SODIUM (PORCINE) 1000 UNIT/ML IJ SOLN
INTRAMUSCULAR | Status: AC
  Filled 2021-05-28: qty 1

## 2021-05-28 MED ORDER — MORPHINE SULFATE (PF) 4 MG/ML IV SOLN
2.0000 mg | INTRAVENOUS | Status: DC | PRN
Start: 2021-05-28 — End: 2021-05-28

## 2021-05-28 SURGICAL SUPPLY — 21 items
BALLN LUTONIX 5X220X130 (BALLOONS) ×2
BALLN LUTONIX DCB 4X40X130 (BALLOONS) ×2
BALLOON LUTONIX 5X220X130 (BALLOONS) ×1 IMPLANT
BALLOON LUTONIX DCB 4X40X130 (BALLOONS) ×1 IMPLANT
CANNULA 5F STIFF (CANNULA) ×2 IMPLANT
CATH ANGIO 5F PIGTAIL 65CM (CATHETERS) ×2 IMPLANT
CATH TEMPO 5F RIM 65CM (CATHETERS) ×2 IMPLANT
COVER PROBE U/S 5X48 (MISCELLANEOUS) ×2 IMPLANT
DEVICE STARCLOSE SE CLOSURE (Vascular Products) ×4 IMPLANT
GLIDEWIRE ADV .035X260CM (WIRE) ×2 IMPLANT
INTRODUCER 7FR 23CM (INTRODUCER) ×4 IMPLANT
KIT ENCORE 26 ADVANTAGE (KITS) ×4 IMPLANT
PACK ANGIOGRAPHY (CUSTOM PROCEDURE TRAY) ×2 IMPLANT
SHEATH ANL2 6FRX45 HC (SHEATH) ×2 IMPLANT
SHEATH BRITE TIP 5FRX11 (SHEATH) ×2 IMPLANT
SHEATH BRITE TIP 6FR X 23 (SHEATH) ×2 IMPLANT
STENT LIFESTREAM 6X58X80 (Permanent Stent) ×4 IMPLANT
SYR MEDRAD MARK 7 150ML (SYRINGE) ×2 IMPLANT
TUBING CONTRAST HIGH PRESS 72 (TUBING) ×2 IMPLANT
WIRE GUIDERIGHT .035X150 (WIRE) ×2 IMPLANT
WIRE MAGIC TOR.035 180C (WIRE) ×4 IMPLANT

## 2021-05-28 NOTE — Op Note (Signed)
[facility]  Percutaneous Study/Intervention Procedural Note   Date of Surgery: 05/28/2021  Surgeon:,  G   Pre-operative Diagnosis: Atherosclerotic occlusive disease bilateral lower extremities with rest pain of the left lower extremity  Post-operative diagnosis:  Same  Procedure(s) Performed:  1.  Abdominal aortogram  2.  Bilateral distal runoff  3.  Percutaneous transluminal angioplasty and stent placement right common iliac artery; "kissing balloon" technique  4.  Percutaneous transluminal and plasty and stent placement left common iliac artery; "kissing balloon" technique  5.  Percutaneous transluminal angioplasty to 5 mm with a Lutonix drug-eluting balloon left superficial femoral artery              6.  Ultrasound guided access bilateral common femoral arteries  7.  StarClose closure device bilateral common femoral arteries  Anesthesia: Conscious sedation was administered under my direct supervision by the interventional radiology RN. IV Versed plus fentanyl were utilized. Continuous ECG, pulse oximetry and blood pressure was monitored throughout the entire procedure. Conscious sedation was for a total of 1 hour 5 minutes 32 seconds.  Sheath: 6 French Ansell right common femoral retrograde for the left SFA intervention; 7 French 23 cm Pinnacle sheaths bilateral common femoral arteries retrograde for the iliac stents  Contrast: 65 cc  Fluoroscopy Time: 7.2 minutes  Indications: Patient presented to the office with increasing pain of her left lower extremity.  She recently underwent successful right lower extremity angiography with intervention and has had significant improvement in her right lower extremity.  Noninvasive studies as well as physical examination were consistent with severe atherosclerotic occlusive disease.  Risks and benefits for angiography with intervention are reviewed all questions been answered patient has agreed to  proceed.  Procedure:  Heather [name] a 70 y.o. female who was identified and appropriate procedural time out was performed.  The patient was then placed supine on the table and prepped and draped in the usual sterile fashion.  Ultrasound was used to evaluate the right common femoral artery.  It was echolucent and pulsatile indicating it is patent .  An ultrasound image was acquired for the permanent record.  A micropuncture needle was used to access the right common femoral artery under direct ultrasound guidance.  The microwire was then advanced under fluoroscopic guidance without difficulty followed by the micro-sheath  A 0.035 J wire was advanced without resistance and a 5Fr sheath was placed.    The pigtail catheter was then positioned at the level of T12 and an AP image of the aorta was obtained. After review the images the pigtail catheter was repositioned above the aortic bifurcation and bilateral oblique views of the pelvis were obtained. Subsequently the detector was returned to the AP position and bilateral lower extremity runoff was obtained.  Diagnostic interpretation: The abdominal aorta is opacified with a bolus injection of contrast.  Demonstrates diffuse disease but there are no hemodynamically significant lesions identified.  The origins of both common iliac arteries demonstrate greater than 70% stenosis which extends for several centimeters.  The distal one third of the right and left common iliac arteries are widely patent and the left demonstrates some mild poststenotic dilatation.  The internal iliac arteries are both patent and diffusely diseased.  The external iliac arteries are both diffusely diseased but there are no hemodynamically significant stenosis.  The left common femoral demonstrates moderate stenosis profunda femoris is small but patent.  The SFA demonstrates a previously placed stent in its proximal one third.  There are 4   or 5 greater than 80% tandem lesions at least 2 of  which involve the stent.  These lesions measure 2 to 4 cm in length.  At [facility] canal the caliber of the SFA actually normalizes and the popliteal is widely patent.  Trifurcation is patent and there is two-vessel runoff to the foot.  4000 units of heparin was given and allowed to circulate for proximally 4 minutes.  The advantage wire was negotiated down into the distal popliteal and an Ansell sheath was advanced up and over the bifurcation position with its tip in the proximal common femoral.  Magnified imaging of the SFA lesions was then performed.  Initially a 5 mm x 200 mm Lutonix drug-eluting balloon was advanced and inflated to 10 atm for approximately 1 minute 30 seconds.  Follow-up imaging demonstrated persistent lesion near Hunter's canal and a 4 mm x 40 mm Lutonix drug-eluting balloon was advanced across this lesion and inflated to 6 atm for 1 full minute.  Follow-up imaging demonstrated less than 10% residual stenosis throughout the SFA popliteal with preservation of distal runoff.  I therefore elected to move forward with bilateral iliac stenting using the kissing balloon technique.  After review the SFA images the ultrasound was reprepped and delivered back onto the sterile field. The left common femoral was then imaged with the ultrasound it was noted to be echolucent and pulsatile indicating patency. Images recorded for the permanent record. Under real-time visualization a microneedle was inserted into the anterior wall the common femoral artery microwire was then advanced without difficulty under fluoroscopic guidance followed by placement of the micro-sheath.  A Magic torque wire was then negotiated under fluoroscopic guidance into the aorta.  7 French 23 cm Pinnacle sheath was then placed.  The 6 [place] Ansell sheath was then upsized to a 7 [place] 23 cm Pinnacle sheath as well after a Magic torque wire was advanced through the pigtail catheter. Magnified images of the aortic bifurcation  were then made using hand injection contrast from the femoral sheaths. After appropriate sizing a 6 mm x 58 mm lifestream stent was selected for the right and a 6 mm x 58 mm lifestream stent was selected for the left. There were then advanced and positioned just above the aortic bifurcation. Insufflation for full expansion of the stents was performed simultaneously. Follow-up imaging was then performed and both stents were well positioned with good apposition to the walls and the lesions have been completely treated.  There is less than 5% residual stenosis within the entire length of the common iliac arteries bilaterally.  Oblique views were then obtained of the groins in succession and Star close device is deployed without difficulty. There were no immediate complications   Findings:  The abdominal aorta is opacified with a bolus injection of contrast.  Demonstrates diffuse disease but there are no hemodynamically significant lesions identified.  The origins of both common iliac arteries demonstrate greater than 70% stenosis which extends for several centimeters.  The distal one third of the right and left common iliac arteries are widely patent and the left demonstrates some mild poststenotic dilatation.  The internal iliac arteries are both patent and diffusely diseased.  The external iliac arteries are both diffusely diseased but there are no hemodynamically significant stenosis.  The left common femoral demonstrates moderate stenosis profunda femoris is small but patent.  The SFA demonstrates a previously placed stent in its proximal one third.  There are 4 or 5 greater than 80% tandem lesions at least  2 of which involve the stent.  These lesions measure 2 to 4 cm in length.  At Hunter's canal the caliber of the SFA actually normalizes and the popliteal is widely patent.  Trifurcation is patent and there is two-vessel runoff to the foot.  Following angioplasty of the left SFA there is now less than  10% residual stenosis.   Following placement of the iliac stents there is now wide patency with less than 5% residual stenosis with rapid flow through the aortic bifurcation bilaterally.  Summary:  Successful reconstruction of the distal aorta and bilateral iliac arteries with treatment of the left SFA  Disposition: Patient was taken to the recovery room in stable condition having tolerated the procedure well.    05/28/2021,11:59 AM  

## 2021-05-28 NOTE — Interval H&P Note (Signed)
History and Physical Interval Note:  05/28/2021 10:24 AM  [name]  has presented today for surgery, with the diagnosis of LLE Angio w intervention  Kissing Iliac stents  BARD    ASO w claudication.  The various methods of treatment have been discussed with the patient and family. After consideration of risks, benefits and other options for treatment, the patient has consented to  Procedure(s): LOWER EXTREMITY ANGIOGRAPHY (Left) as a surgical intervention.  The patient's history has been reviewed, patient examined, no change in status, stable for surgery.  I have reviewed the patient's chart and labs.  Questions were answered to the patient's satisfaction.     Heather [name]

## 2021-05-29 ENCOUNTER — Encounter: Payer: Self-pay | Admitting: Vascular Surgery

## 2021-06-13 ENCOUNTER — Other Ambulatory Visit (INDEPENDENT_AMBULATORY_CARE_PROVIDER_SITE_OTHER): Payer: Self-pay | Admitting: Vascular Surgery

## 2021-06-13 DIAGNOSIS — Z9862 Peripheral vascular angioplasty status: Secondary | ICD-10-CM

## 2021-06-13 DIAGNOSIS — I70222 Atherosclerosis of native arteries of extremities with rest pain, left leg: Secondary | ICD-10-CM

## 2021-06-16 NOTE — Progress Notes (Deleted)
MRN : [id number]  Heather [name] is a 70 y.o. (1950-11-23) female who presents with chief complaint of check legs.  History of Present Illness:   The patient returns to the office for followup and review status post angiogram with intervention 05/28/2021.   Procedure:  Percutaneous transluminal angioplasty and stent placement right common iliac artery; "kissing balloon" technique 2.    Percutaneous transluminal and plasty and stent placement left common iliac artery; "kissing balloon" technique 3.    Percutaneous transluminal angioplasty to 5 mm with a Lutonix drug-eluting balloon left superficial femoral artery   The patient notes improvement in the lower extremity symptoms. No interval shortening of the patient's claudication distance or rest pain symptoms. Previous wounds have now healed.  No new ulcers or wounds have occurred since the last visit.  There have been no significant changes to the patient's overall health care.  The patient denies amaurosis fugax or recent TIA symptoms. There are no recent neurological changes noted. The patient denies history of DVT, PE or superficial thrombophlebitis. The patient denies recent episodes of angina or shortness of breath.   ABI's Rt=*** and Lt=***  (previous ABI's Rt=*** and Lt=***) Duplex US of the *** lower extremity arterial system shows ***   No outpatient medications have been marked as taking for the 06/17/21 encounter (Appointment) with Delana Meyer, Dolores Lory, MD.    Past Medical History:  Diagnosis Date   Anxiety    Bipolar disorder ([place])    Cancer ([place])    skin   Depression    Diabetes mellitus    Diabetes mellitus, type II ([place])    History of artificial heart valve 2018   Aortic valve   Schizophrenia ([place])    Vertigo     Past Surgical History:  Procedure Laterality Date   ABDOMINAL HYSTERECTOMY     BACK SURGERY     CARDIAC VALVE REPLACEMENT     CHOLECYSTECTOMY     HEMORRHOID SURGERY     LOWER EXTREMITY  ANGIOGRAPHY Right 04/12/2021   Procedure: Lower Extremity Angiography;  Surgeon: [name], MD;  Location: [facility];  Service: Cardiovascular;  Laterality: Right;   LOWER EXTREMITY ANGIOGRAPHY Left 05/28/2021   Procedure: LOWER EXTREMITY ANGIOGRAPHY;  Surgeon: [name], MD;  Location: [facility];  Service: Cardiovascular;  Laterality: Left;   TUBAL LIGATION     VISCERAL ANGIOGRAPHY N/A 11/13/2020   Procedure: VISCERAL ANGIOGRAPHY;  Surgeon: [name], MD;  Location: [facility];  Service: Cardiovascular;  Laterality: N/A;    Social History Social History   Tobacco Use   Smoking status: Every Day    Packs/day: 1.50    Types: Cigarettes   Smokeless tobacco: Never  Vaping Use   Vaping Use: Never used  Substance Use Topics   Alcohol use: Not Currently   Drug use: Not Currently    Family History Family History  Problem Relation Age of Onset   Mental illness Mother    Breast cancer Neg Hx     Allergies  Allergen Reactions   Iodinated Diagnostic Agents Hives   Sulfa Antibiotics Hives   Shellfish Allergy Rash and Other (See Comments)    Scallops specifically cause VOMITING Scallops specifically cause VOMITING      REVIEW OF SYSTEMS (Negative unless checked)  Constitutional: [] Weight loss  [] Fever  [] Chills Cardiac: [] Chest pain   [] Chest pressure   [] Palpitations   [] Shortness of breath when laying flat   [] Shortness of breath with  exertion. Vascular:  [x] Pain in legs with walking   [] Pain in legs at rest  [] History of DVT   [] Phlebitis   [] Swelling in legs   [] Varicose veins   [] Non-healing ulcers Pulmonary:   [] Uses home oxygen   [] Productive cough   [] Hemoptysis   [] Wheeze  [x] COPD   [] Asthma Neurologic:  [] Dizziness   [] Seizures   [] History of stroke   [] History of TIA  [] Aphasia   [] Vissual changes   [] Weakness or numbness in arm   [] Weakness or numbness in leg Musculoskeletal:   [] Joint swelling   [] Joint pain    [] Low back pain Hematologic:  [] Easy bruising  [] Easy bleeding   [] Hypercoagulable state   [] Anemic Gastrointestinal:  [] Diarrhea   [] Vomiting  [] Gastroesophageal reflux/heartburn   [] Difficulty swallowing. Genitourinary:  [] Chronic kidney disease   [] Difficult urination  [] Frequent urination   [] Blood in urine Skin:  [] Rashes   [] Ulcers  Psychological:  [] History of anxiety   []  History of major depression.  Physical Examination  There were no vitals filed for this visit. There is no height or weight on file to calculate BMI. Gen: WD/WN, NAD Head: [place]/AT, No temporalis wasting.  Ear/Nose/Throat: Hearing grossly intact, nares w/o erythema or drainage Eyes: PER, EOMI, sclera nonicteric.  Neck: Supple, no masses.  No bruit or JVD.  Pulmonary:  Good air movement, no audible wheezing, no use of accessory muscles.  Cardiac: RRR, normal S1, S2, no Murmurs. Vascular:   Vessel Right Left  Radial Palpable Palpable  Carotid Palpable Palpable  PT Not Palpable Not Palpable  DP Not Palpable Not Palpable  Gastrointestinal: soft, non-distended. No guarding/no peritoneal signs.  Musculoskeletal: M/S 5/5 throughout.  No visible deformity.  Neurologic: CN 2-12 intact. Pain and light touch intact in extremities.  Symmetrical.  Speech is fluent. Motor exam as listed above. Psychiatric: Judgment intact, Mood & affect appropriate for pt's clinical situation. Dermatologic: No rashes or ulcers noted.  No changes consistent with cellulitis.   CBC Lab Results  Component Value Date   WBC 8.6 11/15/2020   HGB 8.5 (L) 11/15/2020   HCT 26.5 (L) 11/15/2020   MCV 92.0 11/15/2020   PLT 324 11/15/2020    BMET    Component Value Date/Time   NA 141 11/15/2020 0539   NA 135 (L) 06/20/2012 2127   K 3.8 11/15/2020 0539   K 4.0 06/20/2012 2127   CL 115 (H) 11/15/2020 0539   CL 100 06/20/2012 2127   CO2 23 11/15/2020 0539   CO2 27 06/20/2012 2127   GLUCOSE 182 (H) 11/15/2020 0539   GLUCOSE 250 (H)  06/20/2012 2127   BUN 33 (H) 05/28/2021 0844   BUN 20 (H) 06/20/2012 2127   CREATININE 0.93 05/28/2021 0844   CREATININE 0.60 06/20/2012 2127   CALCIUM 8.3 (L) 11/15/2020 0539   CALCIUM 8.8 06/20/2012 2127   GFRNONAA >60 05/28/2021 0844   GFRNONAA >60 06/20/2012 2127   GFRAA >60 06/20/2012 2127   CrCl cannot be calculated (Unknown ideal weight.).  COAG No results found for: INR, PROTIME  Radiology PERIPHERAL VASCULAR CATHETERIZATION  Result Date: 05/28/2021 See surgical note for result.    Assessment/Plan There are no diagnoses linked to this encounter.   [name], MD  06/16/2021 11:39 AM

## 2021-06-17 ENCOUNTER — Ambulatory Visit (INDEPENDENT_AMBULATORY_CARE_PROVIDER_SITE_OTHER): Payer: Medicare HMO | Admitting: Vascular Surgery

## 2021-06-17 ENCOUNTER — Encounter (INDEPENDENT_AMBULATORY_CARE_PROVIDER_SITE_OTHER): Payer: Medicare HMO

## 2021-06-17 DIAGNOSIS — I70221 Atherosclerosis of native arteries of extremities with rest pain, right leg: Secondary | ICD-10-CM

## 2021-06-17 DIAGNOSIS — I25118 Atherosclerotic heart disease of native coronary artery with other forms of angina pectoris: Secondary | ICD-10-CM

## 2021-06-17 DIAGNOSIS — I6523 Occlusion and stenosis of bilateral carotid arteries: Secondary | ICD-10-CM

## 2021-06-17 DIAGNOSIS — J439 Emphysema, unspecified: Secondary | ICD-10-CM

## 2021-06-17 DIAGNOSIS — E119 Type 2 diabetes mellitus without complications: Secondary | ICD-10-CM

## 2021-07-08 ENCOUNTER — Encounter (INDEPENDENT_AMBULATORY_CARE_PROVIDER_SITE_OTHER): Payer: Medicare HMO

## 2021-09-17 ENCOUNTER — Other Ambulatory Visit (INDEPENDENT_AMBULATORY_CARE_PROVIDER_SITE_OTHER): Payer: Self-pay | Admitting: Nurse Practitioner

## 2021-09-17 DIAGNOSIS — K551 Chronic vascular disorders of intestine: Secondary | ICD-10-CM

## 2021-09-18 ENCOUNTER — Other Ambulatory Visit: Payer: Self-pay

## 2021-09-18 ENCOUNTER — Encounter (INDEPENDENT_AMBULATORY_CARE_PROVIDER_SITE_OTHER): Payer: Self-pay | Admitting: Nurse Practitioner

## 2021-09-18 ENCOUNTER — Ambulatory Visit (INDEPENDENT_AMBULATORY_CARE_PROVIDER_SITE_OTHER): Payer: Medicare HMO | Admitting: Nurse Practitioner

## 2021-09-18 ENCOUNTER — Ambulatory Visit (INDEPENDENT_AMBULATORY_CARE_PROVIDER_SITE_OTHER): Payer: Medicare HMO

## 2021-09-18 VITALS — BP 152/84 | HR 68 | Resp 16 | Wt 128.2 lb

## 2021-09-18 DIAGNOSIS — Z9862 Peripheral vascular angioplasty status: Secondary | ICD-10-CM | POA: Diagnosis not present

## 2021-09-18 DIAGNOSIS — K551 Chronic vascular disorders of intestine: Secondary | ICD-10-CM | POA: Diagnosis not present

## 2021-09-18 DIAGNOSIS — I70222 Atherosclerosis of native arteries of extremities with rest pain, left leg: Secondary | ICD-10-CM

## 2021-09-18 DIAGNOSIS — I70213 Atherosclerosis of native arteries of extremities with intermittent claudication, bilateral legs: Secondary | ICD-10-CM

## 2021-09-18 DIAGNOSIS — E782 Mixed hyperlipidemia: Secondary | ICD-10-CM | POA: Diagnosis not present

## 2021-09-18 DIAGNOSIS — E119 Type 2 diabetes mellitus without complications: Secondary | ICD-10-CM

## 2021-09-18 DIAGNOSIS — Z794 Long term (current) use of insulin: Secondary | ICD-10-CM

## 2021-09-30 ENCOUNTER — Encounter (INDEPENDENT_AMBULATORY_CARE_PROVIDER_SITE_OTHER): Payer: Self-pay | Admitting: Nurse Practitioner

## 2021-09-30 NOTE — Progress Notes (Signed)
Subjective:    Patient ID: Heather [name], female    DOB: 1951/02/21, 71 y.o.   MRN: [id number] Chief Complaint  Patient presents with   Follow-up    Ultrasound follow up    The patient returns to the office for followup and review status post angiogram with intervention. The patient notes improvement in the lower extremity symptoms. No interval shortening of the patient's claudication distance or rest pain symptoms. Previous wounds have now healed.  No new ulcers or wounds have occurred since the last visit.  There have been no significant changes to the patient's overall health care.  The patient denies amaurosis fugax or recent TIA symptoms. There are no recent neurological changes noted. The patient denies history of DVT, PE or superficial thrombophlebitis. The patient denies recent episodes of angina or shortness of breath.   ABI's Rt=1.04 and Lt=1.13  (previous ABI's Rt=1.14 and Lt=1.08) Duplex US of the right lower extremity shows monophasic/biphasic waveforms with triphasic waveforms in the left.  Both sides have good toe waveforms.  Mesenteric duplex shows normal size aortic diameter.  There is also normal celiac artery, superior mesenteric artery, splenic artery and hepatic artery findings.   Review of Systems  Cardiovascular:  Negative for leg swelling.  Gastrointestinal:  Negative for abdominal pain.  All other systems reviewed and are negative.     Objective:   Physical Exam Vitals reviewed.  HENT:     Head: Normocephalic.  Cardiovascular:     Rate and Rhythm: Normal rate.  Pulmonary:     Effort: Pulmonary effort is normal.  Skin:    General: Skin is warm and dry.  Neurological:     Mental Status: She is alert and oriented to person, place, and time.  Psychiatric:        Mood and Affect: Mood normal.        Behavior: Behavior normal.        Thought Content: Thought content normal.        Judgment: Judgment normal.    BP (!) 152/84 (BP Location: Left Arm)     Pulse 68    Resp 16    Wt 128 lb 3.2 oz (58.2 kg)    BMI 25.89 kg/m   Past Medical History:  Diagnosis Date   Anxiety    Bipolar disorder (HCC)    Cancer ([place])    skin   Depression    Diabetes mellitus    Diabetes mellitus, type II ([place])    History of artificial heart valve 2018   Aortic valve   Schizophrenia (HCC)    Vertigo     Social History   Socioeconomic History   Marital status: Married    Spouse name: rowland   Number of children: 2   Years of education: Not on file   Highest education level: High school graduate  Occupational History   Not on file  Tobacco Use   Smoking status: Every Day    Packs/day: 1.50    Types: Cigarettes   Smokeless tobacco: Never  Vaping Use   Vaping Use: Never used  Substance and Sexual Activity   Alcohol use: Not Currently   Drug use: Not Currently   Sexual activity: Not Currently  Other Topics Concern   Not on file  Social History Narrative   Lives at home with [employer]   Social Determinants of Health   Financial Resource Strain: Not on file  Food Insecurity: Not on file  Transportation Needs: Not on file  Physical  Activity: Not on file  Stress: Not on file  Social Connections: Not on file  Intimate Partner Violence: Not on file    Past Surgical History:  Procedure Laterality Date   ABDOMINAL HYSTERECTOMY     BACK [place] Right 04/12/2021   Procedure: Lower Extremity Angiography;  Surgeon: [name], MD;  Location: [facility];  Service: Cardiovascular;  Laterality: Right;   LOWER EXTREMITY ANGIOGRAPHY Left 05/28/2021   Procedure: LOWER EXTREMITY ANGIOGRAPHY;  Surgeon: [name], MD;  Location: [facility];  Service: Cardiovascular;  Laterality: Left;   TUBAL LIGATION     VISCERAL ANGIOGRAPHY N/A 11/13/2020   Procedure: VISCERAL ANGIOGRAPHY;  Surgeon: [name], MD;   Location: [facility];  Service: Cardiovascular;  Laterality: N/A;    Family History  Problem Relation Age of Onset   Mental illness Mother    Breast cancer Neg Hx     Allergies  Allergen Reactions   Iodinated Contrast Media Hives   Sulfa Antibiotics Hives   Shellfish Allergy Rash and Other (See Comments)    Scallops specifically cause VOMITING Scallops specifically cause VOMITING     CBC Latest Ref Rng & Units 11/15/2020 11/14/2020 11/13/2020  WBC 4.0 - 10.5 K/uL 8.6 7.8 9.2  Hemoglobin 12.0 - 15.0 g/dL 8.5(L) 9.6(L) 9.6(L)  Hematocrit 36.0 - 46.0 % 26.5(L) 29.7(L) 30.3(L)  Platelets 150 - 400 K/uL 324 401(H) 398      CMP     Component Value Date/Time   NA 141 11/15/2020 0539   NA 135 (L) 06/20/2012 2127   K 3.8 11/15/2020 0539   K 4.0 06/20/2012 2127   CL 115 (H) 11/15/2020 0539   CL 100 06/20/2012 2127   CO2 23 11/15/2020 0539   CO2 27 06/20/2012 2127   GLUCOSE 182 (H) 11/15/2020 0539   GLUCOSE 250 (H) 06/20/2012 2127   BUN 33 (H) 05/28/2021 0844   BUN 20 (H) 06/20/2012 2127   CREATININE 0.93 05/28/2021 0844   CREATININE 0.60 06/20/2012 2127   CALCIUM 8.3 (L) 11/15/2020 0539   CALCIUM 8.8 06/20/2012 2127   PROT 7.1 11/09/2020 1651   PROT 6.7 06/20/2012 2127   ALBUMIN 3.4 (L) 11/09/2020 1651   ALBUMIN 3.5 06/20/2012 2127   AST 14 (L) 11/09/2020 1651   AST 17 06/20/2012 2127   ALT 9 11/09/2020 1651   ALT 27 06/20/2012 2127   ALKPHOS 78 11/09/2020 1651   ALKPHOS 127 06/20/2012 2127   BILITOT 0.5 11/09/2020 1651   BILITOT 0.3 06/20/2012 2127   GFRNONAA >60 05/28/2021 0844   GFRNONAA >60 06/20/2012 2127   GFRAA >60 06/20/2012 2127     VAS [place] ABI WITH/WO TBI  Result Date: 09/23/2021  LOWER EXTREMITY DOPPLER STUDY Patient Name:  KAYLISE BLAKELEY  Date of Exam:   09/18/2021 Medical Rec #: [id number]    Accession #:    [id number] Date of Birth: August 13, 1951    Patient Gender: F Patient Age:   29 years Exam Location:  [facility] Procedure:       VAS [place] ABI WITH/WO TBI Referring Phys: GREGORY SCHNIER --------------------------------------------------------------------------------  Indications: Peripheral artery disease. Other Factors: Recent fall and injury to right foot                04/12/2020 rt sfa-pop PTA and stents.  Performing Technologist:  Concha Norway RVT  Examination Guidelines: A complete evaluation includes at minimum, Doppler waveform signals and systolic blood pressure reading at the level of bilateral brachial, anterior tibial, and posterior tibial arteries, when vessel segments are accessible. Bilateral testing is considered an integral part of a complete examination. Photoelectric Plethysmograph (PPG) waveforms and toe systolic pressure readings are included as required and additional duplex testing as needed. Limited examinations for reoccurring indications may be performed as noted.  ABI Findings: +---------+------------------+-----+----------+--------+  Right     Rt Pressure (mmHg) Index Waveform   Comment   +---------+------------------+-----+----------+--------+  Brachial  167                                           +---------+------------------+-----+----------+--------+  ATA       139                0.83  monophasic           +---------+------------------+-----+----------+--------+  PTA       173                1.04  biphasic             +---------+------------------+-----+----------+--------+  Great Toe 133                0.80  Normal               +---------+------------------+-----+----------+--------+ +---------+------------------+-----+---------+-------+  Left      Lt Pressure (mmHg) Index Waveform  Comment  +---------+------------------+-----+---------+-------+  Brachial  160                                         +---------+------------------+-----+---------+-------+  ATA       161                0.96  triphasic          +---------+------------------+-----+---------+-------+  PTA       188                1.13  triphasic           +---------+------------------+-----+---------+-------+  Great Toe 122                0.73  Normal             +---------+------------------+-----+---------+-------+ +-------+-----------+-----------+------------+------------+  ABI/TBI Today's ABI Today's TBI Previous ABI Previous TBI  +-------+-----------+-----------+------------+------------+  Right   1.04        .80         1.14         .63           +-------+-----------+-----------+------------+------------+  Left    1.13        .73         1.08         .51           +-------+-----------+-----------+------------+------------+  Bilateral ABIs appear essentially unchanged compared to prior study on 05/06/2021.  Summary: Right: Resting right ankle-brachial index is within normal range. No evidence of significant right lower extremity arterial disease. The right toe-brachial index is normal. Left: Resting left ankle-brachial index is within normal range. No evidence of significant left lower extremity arterial disease. The left toe-brachial index is normal.  *See table(s) above for measurements and observations.  Electronically signed by [name] MD on 09/23/2021 at 1:05:58 PM.    Final        Assessment & Plan:   1. Atherosclerosis of native artery of both lower extremities with intermittent claudication (HCC)  Recommend:  The patient has evidence of atherosclerosis of the lower extremities with claudication.  The patient does not voice lifestyle limiting changes at this point in time.  Noninvasive studies do not suggest clinically significant change.  No invasive studies, angiography or surgery at this time The patient should continue walking and begin a more formal exercise program.  The patient should continue antiplatelet therapy and aggressive treatment of the lipid abnormalities  No changes in the patient's medications at this time  The patient should continue wearing graduated compression socks 10-15 mmHg strength to control the  mild edema.    2. Chronic mesenteric ischemia (HCC) Recommend:  The patient has evidence of chronic asymptomatic mesenteric atherosclerosis.  The patient denies lifestyle limiting changes at this point in time.  Given the lack of symptoms no intervention is warranted at this time.   No further invasive studies, angiography or surgery at this time  The patient should continue walking and begin a more formal exercise program.   The patient should continue antiplatelet therapy and aggressive treatment of the lipid abnormalities  Patient should undergo noninvasive studies as ordered. The patient will follow up with me after the studies.    3. Mixed hyperlipidemia Continue statin as ordered and reviewed, no changes at this time   4. Type 2 diabetes mellitus without complication, with long-term current use of insulin (HCC) Continue hypoglycemic medications as already ordered, these medications have been reviewed and there are no changes at this time.  Hgb A1C to be monitored as already arranged by primary service    Current Outpatient Medications on File Prior to Visit  Medication Sig Dispense Refill   ACCU-CHEK AVIVA PLUS test strip      Accu-Chek Softclix Lancets lancets      Alcohol Swabs (B-D SINGLE USE SWABS REGULAR) PADS      aspirin EC 81 MG tablet Take 81 mg by mouth daily.     atorvastatin (LIPITOR) 80 MG tablet Take 80 mg by mouth daily.     Blood Glucose Calibration (ACCU-CHEK AVIVA) SOLN      Blood Glucose Monitoring Suppl (ACCU-CHEK AVIVA PLUS) w/Device KIT      cholecalciferol (VITAMIN D) 1000 units tablet Take 1,000 Units by mouth daily.     clopidogrel (PLAVIX) 75 MG tablet Take 1 tablet (75 mg total) by mouth daily. 30 tablet 6   donepezil (ARICEPT) 5 MG tablet Take 5 mg by mouth at bedtime.     escitalopram (LEXAPRO) 20 MG tablet Take 1 tablet (20 mg total) by mouth daily. 90 tablet 0   fluticasone (FLONASE) 50 MCG/ACT nasal spray Place 1 spray into both nostrils  daily as needed for allergies.     Fluticasone-Salmeterol (ADVAIR) 250-50 MCG/DOSE AEPB Inhale 1 puff into the lungs daily.     furosemide (LASIX) 20 MG tablet Take 20 mg by mouth daily.     Insulin Syringe-Needle U-100 30G X 5/16" 1 ML MISC      JARDIANCE 10 MG TABS tablet Take 10 mg by mouth daily.     LEVEMIR FLEXTOUCH 100 UNIT/ML FlexPen Inject 12 Units into the skin at bedtime.     losartan (COZAAR) 50 MG tablet Take 50 mg by mouth daily.     metFORMIN (GLUCOPHAGE) 500  MG tablet Take 500 mg by mouth daily.     metoprolol succinate (TOPROL-XL) 25 MG 24 hr tablet Take 25 mg by mouth daily.     Multiple Vitamins-Minerals (WOMENS MULTI VITAMIN & MINERAL PO) Take 1 tablet by mouth every morning.     pregabalin (LYRICA) 50 MG capsule Take 50 mg by mouth 3 (three) times daily.     traZODone (DESYREL) 50 MG tablet TAKE 1/2 TO 1 TABLET AT BEDTIME AS NEEDED FOR SLEEP (Patient taking differently: Take 25 mg by mouth at bedtime as needed for sleep.) 90 tablet 1   albuterol (VENTOLIN HFA) 108 (90 Base) MCG/ACT inhaler Inhale 2 puffs into the lungs every 4 (four) hours as needed for wheezing or shortness of breath. (Patient not taking: Reported on 05/28/2021)     oxyCODONE-acetaminophen (PERCOCET/ROXICET) 5-325 MG tablet Take 1 tablet by mouth every 6 (six) hours as needed for severe pain. (Patient not taking: Reported on 09/18/2021) 30 tablet 0   No current facility-administered medications on file prior to visit.    There are no Patient Instructions on file for this visit. No follow-ups on file.   Kris Hartmann, NP

## 2021-11-29 ENCOUNTER — Other Ambulatory Visit: Payer: Self-pay | Admitting: Physician Assistant

## 2021-11-29 DIAGNOSIS — Z780 Asymptomatic menopausal state: Secondary | ICD-10-CM

## 2021-11-29 DIAGNOSIS — Z1231 Encounter for screening mammogram for malignant neoplasm of breast: Secondary | ICD-10-CM

## 2022-02-13 ENCOUNTER — Ambulatory Visit
Admission: EM | Admit: 2022-02-13 | Discharge: 2022-02-13 | Disposition: A | Discharge status: Home / Self Care | Payer: Medicare HMO | Attending: Emergency Medicine | Admitting: Emergency Medicine

## 2022-02-13 ENCOUNTER — Ambulatory Visit (INDEPENDENT_AMBULATORY_CARE_PROVIDER_SITE_OTHER): Payer: Medicare HMO

## 2022-02-13 DIAGNOSIS — S92322A Displaced fracture of second metatarsal bone, left foot, initial encounter for closed fracture: Secondary | ICD-10-CM

## 2022-02-13 DIAGNOSIS — S92332A Displaced fracture of third metatarsal bone, left foot, initial encounter for closed fracture: Secondary | ICD-10-CM

## 2022-02-13 DIAGNOSIS — S92342A Displaced fracture of fourth metatarsal bone, left foot, initial encounter for closed fracture: Secondary | ICD-10-CM

## 2022-02-13 MED ORDER — OXYCODONE-ACETAMINOPHEN 5-325 MG PO TABS: 1.0000 | ORAL_TABLET | Freq: Four times a day (QID) | ORAL | 0 refills | Status: DC | PRN

## 2022-02-13 NOTE — ED Triage Notes (Signed)
Patient c/o falling down 1 step -- happened Monday.   Patient starts that her left knee and foot was swollen. Now it is just her left foot. Patient can not put any pressure on her left foot.

## 2022-02-13 NOTE — ED Provider Notes (Signed)
[facility]    CSN: [id number] Arrival date & time: 02/13/22  0849      History   Chief Complaint Chief Complaint  Patient presents with   Foot Injury    Left    HPI Heather [name] is a 71 y.o. female.   HPI  71 year old female here for evaluation of left foot pain and swelling.  Patient reports that she missed a single step and sustained a fall approximately 3 days ago and since that time has not been able to bear weight on her left foot.  She does have an abrasion to her left knee and states that initially was swollen but that has resolved.  She is unsure of how she fell or if she struck her knee when she fell.  She does state that she fell because she lost her balance.  She does have a history of balance issues and uses a cane.  She denies any room spinning or syncope.  She has been applying heat at night and keeping her foot elevated is much as possible but has not been doing anything else in the way of home care for her foot.  She does describe numbness and tingling in her toes.  Past Medical History:  Diagnosis Date   Anxiety    Bipolar disorder ([place])    Cancer ([place])    skin   Depression    Diabetes mellitus    Diabetes mellitus, type II ([place])    History of artificial heart valve 2018   Aortic valve   Schizophrenia ([place])    Vertigo     Patient Active Problem List   Diagnosis Date Noted   Atherosclerosis of native arteries of extremity with rest pain ([place]) 04/11/2021   Ischemic leg pain 04/11/2021   Chronic mesenteric ischemia ([place]) 12/17/2020   Malnutrition of moderate degree 11/12/2020   Asymptomatic bacteriuria 11/10/2020   Abdominal pain 11/09/2020   Hypokalemia 11/09/2020   AKI (acute kidney injury) ([place]) 11/09/2020   MDD (major depressive disorder), recurrent, in full remission ([place]) 01/16/2020   Anxiety disorder 01/16/2020   Hyperlipidemia 12/15/2019   Carotid stenosis 12/15/2019   MDD (major depressive disorder), recurrent, in partial  remission ([place]) 08/24/2019   MDD (major depressive disorder), recurrent episode, mild ([place]) 04/08/2019   Insomnia due to mental condition 04/08/2019   Major neurocognitive disorder due to Alzheimer's disease, probable, without behavioral disturbance 04/08/2019   DDD (degenerative disc disease), cervical 12/19/2016   Frequent falls 12/10/2016   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   Nonrheumatic aortic valve stenosis 09/30/2016   (HFpEF) heart failure with preserved ejection fraction ([place]) 09/20/2016   COPD (chronic obstructive pulmonary disease) ([place]) 09/20/2016   Respiratory failure with hypoxia ([place]) 09/20/2016   NSTEMI (non-ST elevated myocardial infarction) ([place]) 08/21/2016   SOB (shortness of breath) 08/18/2016   Atherosclerosis of native arteries of extremity with intermittent claudication ([place]) 01/08/2016   Palpitations 07/10/2015   History of nonmelanoma skin cancer 03/16/2015   Pseudoaneurysm following procedure ([place]) 07/19/2014   Femoral artery occlusion, right ([place]) 06/28/2014   Claudication ([place]) 06/05/2014   Aortic insufficiency 03/20/2014   CAD (coronary artery disease) 03/20/2014   Chronic pain 03/20/2014   Memory loss 11/09/2012   Depressive disorder 08/02/2012   Neck pain 08/02/2012   Malaise and fatigue 08/02/2012   Hypertension, benign 08/02/2012   Dizziness 08/02/2012   Polyneuropathy in diabetes ([place]) 08/02/2012   Rheumatic fever with heart involvement 08/02/2012   Tobacco use disorder 08/02/2012  Diabetes mellitus ([place]) 08/02/1990    Past Surgical History:  Procedure Laterality Date   ABDOMINAL HYSTERECTOMY     BACK SURGERY     CARDIAC VALVE REPLACEMENT     CHOLECYSTECTOMY     HEMORRHOID SURGERY     LOWER EXTREMITY ANGIOGRAPHY Right 04/12/2021   Procedure: Lower Extremity Angiography;  Surgeon: [name], MD;  Location: [facility];  Service: Cardiovascular;  Laterality: Right;   LOWER EXTREMITY ANGIOGRAPHY  Left 05/28/2021   Procedure: LOWER EXTREMITY ANGIOGRAPHY;  Surgeon: [name], MD;  Location: [facility];  Service: Cardiovascular;  Laterality: Left;   TUBAL LIGATION     VISCERAL ANGIOGRAPHY N/A 11/13/2020   Procedure: VISCERAL ANGIOGRAPHY;  Surgeon: [name], MD;  Location: [facility];  Service: Cardiovascular;  Laterality: N/A;    OB History   No obstetric history on file.      Home Medications    Prior to Admission medications   Medication Sig Start Date End Date Taking? Authorizing Provider  ACCU-CHEK AVIVA PLUS test strip  11/19/18  Yes [provider]  Accu-Chek Softclix Lancets lancets  11/19/18  Yes [provider]  albuterol (VENTOLIN HFA) 108 (90 Base) MCG/ACT inhaler Inhale 2 puffs into the lungs every 4 (four) hours as needed for wheezing or shortness of breath. 03/09/14  Yes [provider]  Alcohol Swabs (B-D SINGLE USE SWABS REGULAR) PADS  11/19/18  Yes [provider]  aspirin EC 81 MG tablet Take 81 mg by mouth daily.   Yes [provider]  atorvastatin (LIPITOR) 80 MG tablet Take 80 mg by mouth daily. 08/28/18  Yes [provider]  Blood Glucose Calibration (ACCU-CHEK AVIVA) SOLN  11/20/18  Yes [provider]  Blood Glucose Monitoring Suppl (ACCU-CHEK AVIVA PLUS) w/Device KIT  11/19/18  Yes [provider]  cholecalciferol (VITAMIN D) 1000 units tablet Take 1,000 Units by mouth daily.   Yes [provider]  clopidogrel (PLAVIX) 75 MG tablet Take 1 tablet (75 mg total) by mouth daily. 11/15/20  Yes Regalado, Belkys A, MD  donepezil (ARICEPT) 5 MG tablet Take 5 mg by mouth at bedtime.   Yes [provider]  escitalopram (LEXAPRO) 20 MG tablet Take 1 tablet (20 mg total) by mouth daily. 01/16/20  Yes Ursula Alert, MD  fluticasone (FLONASE) 50 MCG/ACT nasal spray Place 1 spray into both nostrils daily as needed for allergies. 11/02/16  Yes [provider]  Fluticasone-Salmeterol (ADVAIR) 250-50 MCG/DOSE AEPB Inhale 1 puff into the lungs daily. 10/02/16  Yes [provider]  furosemide (LASIX) 20 MG tablet Take 20 mg by mouth daily. 08/28/18  Yes [provider]  Insulin Syringe-Needle U-100 30G X 5/16" 1 ML MISC  02/19/16  Yes [provider]  LEVEMIR FLEXTOUCH 100 UNIT/ML FlexPen Inject 12 Units into the skin at bedtime. 11/22/20  Yes [provider]  losartan (COZAAR) 50 MG tablet Take 50 mg by mouth daily. 09/07/20  Yes [provider]  metFORMIN (GLUCOPHAGE) 500 MG tablet Take 500 mg by mouth daily. 05/14/21  Yes [provider]  metoprolol succinate (TOPROL-XL) 25 MG 24 hr tablet Take 25 mg by mouth daily. 10/02/18  Yes [provider]  Multiple Vitamins-Minerals (WOMENS MULTI VITAMIN & MINERAL PO) Take 1 tablet by mouth every morning.   Yes [provider]  oxyCODONE-acetaminophen (PERCOCET/ROXICET) 5-325 MG tablet Take 1 tablet by mouth every 6 (six) hours as needed for severe pain. 02/13/22  Yes Margarette Canada, NP  pregabalin (LYRICA) 50 MG capsule Take 50 mg by mouth 3 (three) times daily.   Yes [provider]  traZODone (DESYREL) 50 MG tablet TAKE 1/2 TO 1 TABLET AT BEDTIME AS NEEDED FOR SLEEP Patient taking differently: Take 25 mg by mouth at bedtime as needed for sleep. 03/06/21  Yes Eappen, Ria Clock, MD  JARDIANCE 10 MG TABS tablet Take 10 mg by mouth daily. 04/12/21   [provider]    Family History Family History  Problem Relation Age of Onset   Mental illness Mother    Breast cancer Neg Hx     Social History Social History   Tobacco Use   Smoking status: Every Day    Packs/day: 1.50    Types: Cigarettes   Smokeless tobacco: Never  Vaping Use   Vaping Use: Never used  Substance Use Topics   Alcohol use: Not Currently   Drug use: Not Currently     Allergies   Iodinated contrast media, Sulfa antibiotics, and Shellfish  allergy   Review of Systems Review of Systems  Musculoskeletal:  Positive for arthralgias and joint swelling.  Skin:  Positive for color change and wound.  Neurological:  Positive for numbness. Negative for weakness.  Hematological: Negative.   Psychiatric/Behavioral: Negative.       Physical Exam Triage Vital Signs ED Triage Vitals  Enc Vitals Group     BP 02/13/22 0907 (!) 116/92     Pulse Rate 02/13/22 0907 87     Resp 02/13/22 0907 18     Temp 02/13/22 0907 98.5 F (36.9 C)     Temp Source 02/13/22 0907 Oral     SpO2 02/13/22 0907 93 %     Weight 02/13/22 0904 125 lb (56.7 kg)     Height 02/13/22 0904 5' (1.524 m)     Head Circumference --      Peak Flow --      Pain Score 02/13/22 0903 9     Pain Loc --      Pain Edu? --      Excl. in [place]? --    No data found.  Updated Vital Signs BP (!) 116/92 (BP Location: Left Arm)   Pulse 87   Temp 98.5 F (36.9 C) (Oral)   Resp 18   Ht 5' (1.524 m)   Wt 125 lb (56.7 kg)   SpO2 93%   BMI 24.41 kg/m   Visual Acuity Right Eye Distance:   Left Eye Distance:   Bilateral Distance:    Right Eye Near:   Left Eye Near:    Bilateral Near:     Physical Exam Vitals and nursing note reviewed.  Constitutional:      Appearance: Normal appearance.  HENT:     Head: Normocephalic and atraumatic.  Musculoskeletal:        General: Swelling, tenderness and signs of injury present. No deformity.  Skin:    General: Skin is warm and dry.     Capillary Refill: Capillary refill takes less than 2 seconds.     Findings: Bruising and erythema present.  Neurological:     General: No focal deficit present.     Mental Status: She is alert and oriented to person, place, and time.     Sensory: No sensory deficit.  Psychiatric:        Mood and Affect: Mood normal.        Behavior: Behavior normal.  Thought Content: Thought content normal.        Judgment: Judgment normal.      UC Treatments / Results  Labs (all labs  ordered are listed, but only abnormal results are displayed) Labs Reviewed - No data to display  EKG   Radiology DG Foot Complete Left  Result Date: 02/13/2022 CLINICAL DATA:  Pain, swelling and ecchymosis for 4 days. EXAM: LEFT FOOT - COMPLETE 3+ VIEW COMPARISON:  None Available. FINDINGS: There is mild lateral cortical step-off at the third and fourth metatarsal necks with acute fractures and minimal 2 mm lateral displacement of the distal fracture component with respect to the proximal fracture component. Likely additional second metatarsal neck acute fracture with less, about 1 mm cortical step-off/minimal lateral displacement. Mild-to-moderate dorsal tarsometatarsal degenerative osteophytosis on lateral view. No dislocation. Mild forefoot soft tissue swelling. Vascular calcifications are noted. IMPRESSION: Acute minimally displaced fractures of the second through fourth metatarsals with minimal lateral displacement of the distal fracture component within the fourth greater than third metatarsals. Electronically Signed   By: Yvonne Kendall M.D.   On: 02/13/2022 09:44    Procedures Procedures (including critical care time)  Medications Ordered in UC Medications - No data to display  Initial Impression / Assessment and Plan / UC Course  I have reviewed the triage vital signs and the nursing notes.  Pertinent labs & imaging results that were available during my care of the patient were reviewed by me and considered in my medical decision making (see chart for details).  Patient is a nontoxic-appearing 71 year old female here for evaluation of pain and swelling to her left foot status post fall approximately 3 days ago as outlined in HPI above.  On exam patient has edema from her ankle to the tips of her toes.  The distal, dorsal, lateral aspect of her foot is erythematous, mildly edematous, and hot to touch.  There is ecchymosis to the lateral aspect of her proximal left great toe and both the  proximal and middle phalanx ease of the second and third toe.  These toes are very tender to touch.  Cap refills less than 2 seconds.  Her DP and PT pulses are 2+.  She has diffuse pain across the midfoot but denies any pain with palpation of the calcaneus, compression of medial lateral malleolus, palpation of the Achilles tendon, or palpation of the arch of her foot.  She does have a small abrasion on the inferior aspect of the patella of the left knee.  There is no edema, erythema, or ecchymosis noted.  She has full range of motion of her knee.  She is reluctant to perform any range of motion exercises of her foot secondary to pain.  I will order an x-ray of her left foot to look for bony abnormality.  Left foot x-rays independently reviewed and evaluated by me.  Impression: There are fractures visible in the oblique view of the heads of the second, third, and fourth metatarsal.  There is also degenerative changes of the midfoot.  Radiology overread is pending. Radiology impression states acute minimally displaced fractures of the second through fourth metatarsals with minimal lateral displacement of the distal fracture components within the fourth greater than the third metatarsals.  I will place the patient in a [place] with crutches as needed for weightbearing.  I will refer her to [facility] in [place] for further evaluation and treatment.  I will also discharge her home with a short prescription for  oxycodone, which she has taken in the past, to help with acute pain.  Patient is to be nonweightbearing.   Final Clinical Impressions(s) / UC Diagnoses   Final diagnoses:  Closed displaced fracture of second metatarsal bone of left foot, initial encounter  Closed displaced fracture of third metatarsal bone of left foot, initial encounter  Closed displaced fracture of fourth metatarsal bone of left foot, initial encounter   Discharge Instructions   None    ED  Prescriptions     Medication Sig Dispense Auth. Provider   oxyCODONE-acetaminophen (PERCOCET/ROXICET) 5-325 MG tablet Take 1 tablet by mouth every 6 (six) hours as needed for severe pain. 15 tablet Margarette Canada, NP      I have reviewed the PDMP during this encounter.   Margarette Canada, NP 02/13/22 [phone]

## 2022-02-13 NOTE — Discharge Instructions (Addendum)
Your x-rays show that you have broken the bones of your toes in your second, third, and fourth toe.  I will put you in a [place] shoe to help minimize movement and aid in pain relief.  I want to use crutches for ambulation as necessary.  I do not want you to attempt to walk on your foot.  Keep your left foot elevated is much as possible and apply ice to your foot for 20 minutes at a time 2-3 times a day.  You may take over-the-counter Tylenol as needed for mild to moderate pain and use the Percocet as needed for more severe pain.  Be mindful that this will sedate you and may make your balance issues worse.  If you develop any increased swelling, pain, numbness, or tingling in your foot please go to the emergency department for evaluation.  I have referred you to podiatry for evaluation and they will call you for an appointment.

## 2022-02-13 NOTE — ED Provider Notes (Incomplete)
[facility]    CSN: [id number] Arrival date & time: 02/13/22  0849      History   Chief Complaint Chief Complaint  Patient presents with  . Foot Injury    Left    HPI Heather [name] is a 71 y.o. female.   HPI  71 year old female here for evaluation of left foot pain and swelling.  Patient reports that she missed a single step and sustained a fall approximately 3 days ago and since that time has not been able to bear weight on her left foot.  She does have an abrasion to her left knee and states that initially was swollen but that has resolved.  She is unsure of how she fell or if she struck her knee when she fell.  She does state that she fell because she lost her balance.  She does have a history of balance issues and uses a cane.  She denies any room spinning or syncope.  She has been applying heat at night and keeping her foot elevated is much as possible but has not been doing anything else in the way of home care for her foot.  She does describe numbness and tingling in her toes.  Past Medical History:  Diagnosis Date  . Anxiety   . Bipolar disorder ([place])   . Cancer ([place])    skin  . Depression   . Diabetes mellitus   . Diabetes mellitus, type II ([place])   . History of artificial heart valve 2018   Aortic valve  . Schizophrenia ([place])   . Vertigo     Patient Active Problem List   Diagnosis Date Noted  . Atherosclerosis of native arteries of extremity with rest pain ([place]) 04/11/2021  . Ischemic leg pain 04/11/2021  . Chronic mesenteric ischemia ([place]) 12/17/2020  . Malnutrition of moderate degree 11/12/2020  . Asymptomatic bacteriuria 11/10/2020  . Abdominal pain 11/09/2020  . Hypokalemia 11/09/2020  . AKI (acute kidney injury) ([place]) 11/09/2020  . MDD (major depressive disorder), recurrent, in full remission ([place]) 01/16/2020  . Anxiety disorder 01/16/2020  . Hyperlipidemia 12/15/2019  . Carotid stenosis 12/15/2019  . MDD (major depressive disorder),  recurrent, in partial remission ([place]) 08/24/2019  . MDD (major depressive disorder), recurrent episode, mild ([place]) 04/08/2019  . Insomnia due to mental condition 04/08/2019  . Major neurocognitive disorder due to Alzheimer's disease, probable, without behavioral disturbance 04/08/2019  . DDD (degenerative disc disease), cervical 12/19/2016  . Frequent falls 12/10/2016  . 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 Nonrheumatic aortic valve stenosis 09/30/2016  . (HFpEF) heart failure with preserved ejection fraction ([place]) 09/20/2016  . COPD (chronic obstructive pulmonary disease) ([place]) 09/20/2016  . Respiratory failure with hypoxia ([place]) 09/20/2016  . NSTEMI (non-ST elevated myocardial infarction) ([place]) 08/21/2016  . SOB (shortness of breath) 08/18/2016  . Atherosclerosis of native arteries of extremity with intermittent claudication ([place]) 01/08/2016  . Palpitations 07/10/2015  . History of nonmelanoma skin cancer 03/16/2015  . Pseudoaneurysm following procedure ([place]) 07/19/2014  . 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 Claudication ([place]) 06/05/2014  . Aortic insufficiency 03/20/2014  . CAD (coronary artery disease) 03/20/2014  . Chronic pain 03/20/2014  . Memory loss 11/09/2012  . Depressive disorder 08/02/2012  . Neck pain 08/02/2012  . Malaise and fatigue 08/02/2012  . Hypertension, benign 08/02/2012  . Dizziness 08/02/2012  . Polyneuropathy in diabetes ([place]) 08/02/2012  . Rheumatic fever with heart involvement 08/02/2012  . Tobacco use disorder 08/02/2012  .  Diabetes mellitus ([place]) 08/02/1990    Past Surgical History:  Procedure Laterality Date  . ABDOMINAL HYSTERECTOMY    . BACK SURGERY    . CARDIAC VALVE REPLACEMENT    . CHOLECYSTECTOMY    . HEMORRHOID SURGERY    . LOWER EXTREMITY ANGIOGRAPHY Right 04/12/2021   Procedure: Lower Extremity Angiography;  Surgeon: [name], MD;  Location: [facility];  Service:  Cardiovascular;  Laterality: Right;  . LOWER EXTREMITY ANGIOGRAPHY Left 05/28/2021   Procedure: LOWER EXTREMITY ANGIOGRAPHY;  Surgeon: [name], MD;  Location: [facility];  Service: Cardiovascular;  Laterality: Left;  . TUBAL LIGATION    . VISCERAL ANGIOGRAPHY N/A 11/13/2020   Procedure: VISCERAL ANGIOGRAPHY;  Surgeon: [name], MD;  Location: [facility];  Service: Cardiovascular;  Laterality: N/A;    OB History   No obstetric history on file.      Home Medications    Prior to Admission medications   Medication Sig Start Date End Date Taking? Authorizing Provider  ACCU-CHEK AVIVA PLUS test strip  11/19/18  Yes [provider]  Accu-Chek Softclix Lancets lancets  11/19/18  Yes [provider]  albuterol (VENTOLIN HFA) 108 (90 Base) MCG/ACT inhaler Inhale 2 puffs into the lungs every 4 (four) hours as needed for wheezing or shortness of breath. 03/09/14  Yes [provider]  Alcohol Swabs (B-D SINGLE USE SWABS REGULAR) PADS  11/19/18  Yes [provider]  aspirin EC 81 MG tablet Take 81 mg by mouth daily.   Yes [provider]  atorvastatin (LIPITOR) 80 MG tablet Take 80 mg by mouth daily. 08/28/18  Yes [provider]  Blood Glucose Calibration (ACCU-CHEK AVIVA) SOLN  11/20/18  Yes [provider]  Blood Glucose Monitoring Suppl (ACCU-CHEK AVIVA PLUS) w/Device KIT  11/19/18  Yes [provider]  cholecalciferol (VITAMIN D) 1000 units tablet Take 1,000 Units by mouth daily.   Yes [provider]  clopidogrel (PLAVIX) 75 MG tablet Take 1 tablet (75 mg total) by mouth daily. 11/15/20  Yes Regalado, Belkys A, MD  donepezil (ARICEPT) 5 MG tablet Take 5 mg by mouth at bedtime.   Yes [provider]  escitalopram (LEXAPRO) 20 MG tablet Take 1 tablet (20 mg total) by mouth daily. 01/16/20  Yes Ursula Alert, MD  fluticasone (FLONASE) 50 MCG/ACT nasal spray Place 1 spray into  both nostrils daily as needed for allergies. 11/02/16  Yes [provider]  Fluticasone-Salmeterol (ADVAIR) 250-50 MCG/DOSE AEPB Inhale 1 puff into the lungs daily. 10/02/16  Yes [provider]  furosemide (LASIX) 20 MG tablet Take 20 mg by mouth daily. 08/28/18  Yes [provider]  Insulin Syringe-Needle U-100 30G X 5/16" 1 ML MISC  02/19/16  Yes [provider]  LEVEMIR FLEXTOUCH 100 UNIT/ML FlexPen Inject 12 Units into the skin at bedtime. 11/22/20  Yes [provider]  losartan (COZAAR) 50 MG tablet Take 50 mg by mouth daily. 09/07/20  Yes [provider]  metFORMIN (GLUCOPHAGE) 500 MG tablet Take 500 mg by mouth daily. 05/14/21  Yes [provider]  metoprolol succinate (TOPROL-XL) 25 MG 24 hr tablet Take 25 mg by mouth daily. 10/02/18  Yes [provider]  Multiple Vitamins-Minerals (WOMENS MULTI VITAMIN & MINERAL PO) Take 1 tablet by mouth every morning.   Yes [provider]  oxyCODONE-acetaminophen (PERCOCET/ROXICET) 5-325 MG tablet Take 1 tablet by mouth every 6 (six) hours as needed for severe pain. 05/21/21  Yes Kris Hartmann, NP  pregabalin (LYRICA) 50 MG capsule Take 50 mg by mouth 3 (three) times daily.   Yes [provider]  traZODone (DESYREL) 50 MG tablet TAKE 1/2 TO 1 TABLET AT BEDTIME AS NEEDED FOR SLEEP Patient taking differently: Take 25 mg by mouth at bedtime as needed for sleep. 03/06/21  Yes Eappen, Ria Clock, MD  JARDIANCE 10 MG TABS tablet Take 10 mg by mouth daily. 04/12/21   [provider]    Family History Family History  Problem Relation Age of Onset  . Mental illness Mother   . Breast cancer Neg Hx     Social History Social History   Tobacco Use  . Smoking status: Every Day    Packs/day: 1.50    Types: Cigarettes  . Smokeless tobacco: Never  Vaping Use  . Vaping Use: Never used  Substance Use Topics  . Alcohol use: Not Currently  . Drug use: Not Currently      Allergies   Iodinated contrast media, Sulfa antibiotics, and Shellfish allergy   Review of Systems Review of Systems  Musculoskeletal:  Positive for arthralgias and joint swelling.  Skin:  Positive for color change and wound.  Neurological:  Positive for numbness. Negative for weakness.  Hematological: Negative.   Psychiatric/Behavioral: Negative.       Physical Exam Triage Vital Signs ED Triage Vitals  Enc Vitals Group     BP 02/13/22 0907 (!) 116/92     Pulse Rate 02/13/22 0907 87     Resp 02/13/22 0907 18     Temp 02/13/22 0907 98.5 F (36.9 C)     Temp Source 02/13/22 0907 Oral     SpO2 02/13/22 0907 93 %     Weight 02/13/22 0904 125 lb (56.7 kg)     Height 02/13/22 0904 5' (1.524 m)     Head Circumference --      Peak Flow --      Pain Score 02/13/22 0903 9     Pain Loc --      Pain Edu? --      Excl. in [place]? --    No data found.  Updated Vital Signs BP (!) 116/92 (BP Location: Left Arm)   Pulse 87   Temp 98.5 F (36.9 C) (Oral)   Resp 18   Ht 5' (1.524 m)   Wt 125 lb (56.7 kg)   SpO2 93%   BMI 24.41 kg/m   Visual Acuity Right Eye Distance:   Left Eye Distance:   Bilateral Distance:    Right Eye Near:   Left Eye Near:    Bilateral Near:     Physical Exam Vitals and nursing note reviewed.  Constitutional:      Appearance: Normal appearance.  HENT:     Head: Normocephalic and atraumatic.  Musculoskeletal:        General: Swelling, tenderness and signs of injury present. No deformity.  Skin:    General: Skin is warm and dry.     Capillary Refill: Capillary refill takes less than 2 seconds.     Findings: Bruising and erythema present.  Neurological:     General: No focal deficit present.     Mental Status: She is alert and oriented to person, place, and time.     Sensory: No sensory deficit.  Psychiatric:        Mood and Affect: Mood normal.        Behavior: Behavior normal.  Thought Content: Thought content normal.         Judgment: Judgment normal.      UC Treatments / Results  Labs (all labs ordered are listed, but only abnormal results are displayed) Labs Reviewed - No data to display  EKG   Radiology No results found.  Procedures Procedures (including critical care time)  Medications Ordered in UC Medications - No data to display  Initial Impression / Assessment and Plan / UC Course  I have reviewed the triage vital signs and the nursing notes.  Pertinent labs & imaging results that were available during my care of the patient were reviewed by me and considered in my medical decision making (see chart for details).  Patient is a nontoxic-appearing 71 year old female here for evaluation of pain and swelling to her left foot status post fall approximately 3 days ago as outlined in HPI above.  On exam patient has edema from her ankle to the tips of her toes.  The distal, dorsal, lateral aspect of her foot is erythematous, mildly edematous, and hot to touch.  There is ecchymosis to the lateral aspect of her proximal left great toe and both the proximal and middle phalanx ease of the second and third toe.  These toes are very tender to touch.  Cap refills less than 2 seconds.  Her DP and PT pulses are 2+.  She has diffuse pain across the midfoot but denies any pain with palpation of the calcaneus, compression of medial lateral malleolus, palpation of the Achilles tendon, or palpation of the arch of her foot.  She does have a small abrasion on the inferior aspect of the patella of the left knee.  There is no edema, erythema, or ecchymosis noted.  She has full range of motion of her knee.  She is reluctant to perform any range of motion exercises of her foot secondary to pain.  I will order an x-ray of her left foot to look for bony abnormality.  Left foot x-rays independently reviewed and evaluated by me.  Impression: On the AP and oblique views there clear displaced fractures of the heads of the second,  third, and fourth metatarsal.  The remainder of the bony structures are normal in appearance.  There is some degenerative changes to the joints of the midfoot.  The calcaneus is unremarkable.  Radiology overread is pending. Radiology impression states there is mild lateral cortical step-off of the third and fourth metatarsal necks with acute fractures and a minimal 2 mm lateral displacement of the distal fracture component with respect to the proximal fracture component.  Likely additional second metatarsal neck acute fracture with less than 1 mm cortical step-off/minimal lateral displacement.  Mild to moderate dorsal tarsometatarsal degenerative osteophytosis in the lateral view.  No dislocation.   Final Clinical Impressions(s) / UC Diagnoses   Final diagnoses:  None   Discharge Instructions   None    ED Prescriptions   None    PDMP not reviewed this encounter.

## 2022-02-17 ENCOUNTER — Ambulatory Visit: Payer: Medicare HMO | Admitting: Podiatry

## 2022-02-17 ENCOUNTER — Ambulatory Visit (INDEPENDENT_AMBULATORY_CARE_PROVIDER_SITE_OTHER): Payer: Medicare HMO

## 2022-02-17 ENCOUNTER — Telehealth (INDEPENDENT_AMBULATORY_CARE_PROVIDER_SITE_OTHER): Payer: Self-pay

## 2022-02-17 DIAGNOSIS — S9782XA Crushing injury of left foot, initial encounter: Secondary | ICD-10-CM

## 2022-02-17 DIAGNOSIS — S92302A Fracture of unspecified metatarsal bone(s), left foot, initial encounter for closed fracture: Secondary | ICD-10-CM

## 2022-02-17 MED ORDER — OXYCODONE-ACETAMINOPHEN 5-325 MG PO TABS: 1.0000 | ORAL_TABLET | Freq: Four times a day (QID) | ORAL | 0 refills | Status: DC | PRN

## 2022-02-17 NOTE — Telephone Encounter (Signed)
Patient left a message stating requesting for pain medication. Patient is having swelling and pain with her broken left foot. Return call to patient but was not able to leave message.

## 2022-02-20 ENCOUNTER — Encounter: Payer: Self-pay | Admitting: Podiatry

## 2022-02-20 NOTE — Progress Notes (Signed)
  Subjective:  Patient ID: Heather [name], female    DOB: 09/24/1950,  MRN: [id number]  Chief Complaint  Patient presents with   Foot Injury     New pt-4 toes broken-Left foot- Patient fell was seen in UC    71 y.o. female presents with the above complaint. History confirmed with patient.  She fell and injured her foot 4 days ago on the 22nd.  She was seen in urgent care and they gave her pain medication and placed her into a surgical shoe.  An object fell onto her foot.  Objective:  Physical Exam: warm, good capillary refill, no trophic changes or ulcerative lesions, normal sensory exam, and pain edema and swelling over left lesser MTPJ's, pedal pulses are nonpalpable secondary to edema   Radiographs: Multiple views x-ray of the left foot: New weightbearing radiographs taken today show distal metatarsal neck fractures of the second third and fourth metatarsals, minimal displacement Assessment:   1. Crush injury of foot, left, initial encounter   2. Closed nondisplaced fracture of metatarsal bone of left foot, unspecified metatarsal, initial encounter      Plan:  Patient was evaluated and treated and all questions answered.  We reviewed her previous and today's x-rays.  There is minimal displacement even with weightbearing.  I recommended nonoperative treatment.  She is WBAT in a surgical shoe which she already has.  I refilled her pain medication at her request.  I will see her back in 1 month for new x-rays  Return in about 1 month (around 03/19/2022) for fracture follow up (new xrays).

## 2022-03-18 ENCOUNTER — Other Ambulatory Visit (INDEPENDENT_AMBULATORY_CARE_PROVIDER_SITE_OTHER): Payer: Self-pay | Admitting: Nurse Practitioner

## 2022-03-18 DIAGNOSIS — I739 Peripheral vascular disease, unspecified: Secondary | ICD-10-CM

## 2022-03-18 DIAGNOSIS — K551 Chronic vascular disorders of intestine: Secondary | ICD-10-CM

## 2022-03-19 ENCOUNTER — Ambulatory Visit (INDEPENDENT_AMBULATORY_CARE_PROVIDER_SITE_OTHER): Payer: Medicare HMO

## 2022-03-19 ENCOUNTER — Ambulatory Visit (INDEPENDENT_AMBULATORY_CARE_PROVIDER_SITE_OTHER): Payer: Medicare HMO | Admitting: Nurse Practitioner

## 2022-03-19 ENCOUNTER — Encounter (INDEPENDENT_AMBULATORY_CARE_PROVIDER_SITE_OTHER): Payer: Self-pay | Admitting: Nurse Practitioner

## 2022-03-19 ENCOUNTER — Ambulatory Visit: Payer: Medicare HMO | Admitting: Podiatry

## 2022-03-19 VITALS — BP 161/81 | HR 77 | Resp 16 | Wt 127.0 lb

## 2022-03-19 DIAGNOSIS — E782 Mixed hyperlipidemia: Secondary | ICD-10-CM | POA: Diagnosis not present

## 2022-03-19 DIAGNOSIS — K551 Chronic vascular disorders of intestine: Secondary | ICD-10-CM

## 2022-03-19 DIAGNOSIS — S92902A Unspecified fracture of left foot, initial encounter for closed fracture: Secondary | ICD-10-CM

## 2022-03-19 DIAGNOSIS — I70222 Atherosclerosis of native arteries of extremities with rest pain, left leg: Secondary | ICD-10-CM | POA: Diagnosis not present

## 2022-03-19 DIAGNOSIS — I739 Peripheral vascular disease, unspecified: Secondary | ICD-10-CM

## 2022-03-19 DIAGNOSIS — Z9889 Other specified postprocedural states: Secondary | ICD-10-CM

## 2022-03-19 MED ORDER — TRAMADOL HCL 50 MG PO TABS: 50.0000 mg | ORAL_TABLET | Freq: Four times a day (QID) | ORAL | 0 refills | Status: DC | PRN

## 2022-03-19 NOTE — H&P (View-Only) (Signed)
Subjective:    Patient ID: Heather [name], female    DOB: Mar 29, 1951, 71 y.o.   MRN: [id number] Chief Complaint  Patient presents with   Follow-up    Ultrasound follow up    Heather [name] is a 71 year old female who returns today for follow-up evaluation of her ABIs as well as her mesenteric ischemia.  She denies any stomach pain.  She denies any nausea or vomiting or food phobia.  She does however note pain in her left lower extremity.  She notes the pain is from her foot and it travels up towards her knee and thigh area.  She notes it is a burning stinging numbness.  She notes she also has continued significant pain from her recent broken foot.  Today noninvasive studies show an ABI of 1.05 on the right and 0.93 on the left.  These ABIs are fairly consistent with her previous studies however she has significantly decreased TBI's.  Lower extremity arterial duplex also notes monophasic waveforms throughout the right lower extremity with primarily monophasic waveforms throughout the left as well.  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    Review of Systems  Musculoskeletal:  Positive for arthralgias and gait problem.  All other systems reviewed and are negative.      Objective:   Physical Exam Vitals reviewed.  HENT:     Head: Normocephalic.  Cardiovascular:     Rate and Rhythm: Normal rate.     Comments: Not able to palpate pedal pulses due to broken foot but notable by Doppler bilaterally Pulmonary:     Effort: Pulmonary effort is normal.  Skin:    General: Skin is warm and dry.  Neurological:     Mental Status: She is alert and oriented to person, place, and time.  Psychiatric:        Mood and Affect: Mood normal.        Behavior: Behavior normal.        Thought Content: Thought content normal.        Judgment: Judgment normal.     BP (!) 161/81 (BP Location: Right Arm)   Pulse 77   Resp 16   Wt 127 lb (57.6 kg)   BMI  24.80 kg/m   Past Medical History:  Diagnosis Date   Anxiety    Bipolar disorder ([place])    Cancer ([place])    skin   Depression    Diabetes mellitus    Diabetes mellitus, type II ([place])    History of artificial heart valve 2018   Aortic valve   Schizophrenia (HCC)    Vertigo     Social History   Socioeconomic History   Marital status: Married    Spouse name: rowland   Number of children: 2   Years of education: Not on file   Highest education level: High school graduate  Occupational History   Not on file  Tobacco Use   Smoking status: Every Day    Packs/day: 1.50    Types: Cigarettes   Smokeless tobacco: Never  Vaping Use   Vaping Use: Never used  Substance and Sexual Activity   Alcohol use: Not Currently   Drug use: Not Currently   Sexual activity: Not Currently  Other Topics Concern   Not on file  Social History Narrative   Lives at home with [employer]   Social Determinants of Health   Financial Resource Strain: Medium Risk (10/19/2018)   Overall Financial Resource Strain (  CARDIA)    Difficulty of Paying Living Expenses: Somewhat hard  Food Insecurity: Food Insecurity Present (10/19/2018)   Hunger Vital Sign    Worried About Running Out of Food in the Last Year: Often true    Ran Out of Food in the Last Year: Often true  Transportation Needs: No Transportation Needs (10/19/2018)   PRAPARE - Hydrologist (Medical): No    Lack of Transportation (Non-Medical): No  Physical Activity: Inactive (10/19/2018)   Exercise Vital Sign    Days of Exercise per Week: 0 days    Minutes of Exercise per Session: 0 min  Stress: Not on file  Social Connections: Unknown (10/19/2018)   Social Connection and Isolation Panel [NHANES]    Frequency of Communication with Friends and Family: Not on file    Frequency of Social Gatherings with Friends and Family: Not on file    Attends Religious Services: More than 4 times per year    Active Member of [employer] or  Organizations: No    Attends Archivist Meetings: Never    Marital Status: Married  Human resources officer Violence: Not At Risk (10/19/2018)   Humiliation, Afraid, Rape, and Kick questionnaire    Fear of Current or Ex-Partner: No    Emotionally Abused: No    Physically Abused: No    Sexually Abused: No    Past Surgical History:  Procedure Laterality Date   ABDOMINAL HYSTERECTOMY     BACK SURGERY     CARDIAC VALVE REPLACEMENT     CHOLECYSTECTOMY     HEMORRHOID SURGERY     LOWER EXTREMITY ANGIOGRAPHY Right 04/12/2021   Procedure: Lower Extremity Angiography;  Surgeon: [name], MD;  Location: [facility];  Service: Cardiovascular;  Laterality: Right;   LOWER EXTREMITY ANGIOGRAPHY Left 05/28/2021   Procedure: LOWER EXTREMITY ANGIOGRAPHY;  Surgeon: [name], MD;  Location: [facility];  Service: Cardiovascular;  Laterality: Left;   TUBAL LIGATION     VISCERAL ANGIOGRAPHY N/A 11/13/2020   Procedure: VISCERAL ANGIOGRAPHY;  Surgeon: [name], MD;  Location: [facility];  Service: Cardiovascular;  Laterality: N/A;    Family History  Problem Relation Age of Onset   Mental illness Mother    Breast cancer Neg Hx     Allergies  Allergen Reactions   Iodinated Contrast Media Hives   Sulfa Antibiotics Hives   Shellfish Allergy Rash and Other (See Comments)    Scallops specifically cause VOMITING Scallops specifically cause VOMITING        Latest Ref Rng & Units 11/15/2020    5:39 AM 11/14/2020    5:43 AM 11/13/2020    5:08 AM  CBC  WBC 4.0 - 10.5 K/uL 8.6  7.8  9.2   Hemoglobin 12.0 - 15.0 g/dL 8.5  9.6  9.6   Hematocrit 36.0 - 46.0 % 26.5  29.7  30.3   Platelets 150 - 400 K/uL 324  401  398       CMP     Component Value Date/Time   NA 141 11/15/2020 0539   NA 135 (L) 06/20/2012 2127   K 3.8 11/15/2020 0539   K 4.0 06/20/2012 2127   CL 115 (H) 11/15/2020 0539   CL 100 06/20/2012 2127   CO2 23 11/15/2020 0539    CO2 27 06/20/2012 2127   GLUCOSE 182 (H) 11/15/2020 0539   GLUCOSE 250 (H) 06/20/2012 2127   BUN 33 (H) 05/28/2021 7035  BUN 20 (H) 06/20/2012 2127   CREATININE 0.93 05/28/2021 0844   CREATININE 0.60 06/20/2012 2127   CALCIUM 8.3 (L) 11/15/2020 0539   CALCIUM 8.8 06/20/2012 2127   PROT 7.1 11/09/2020 1651   PROT 6.7 06/20/2012 2127   ALBUMIN 3.4 (L) 11/09/2020 1651   ALBUMIN 3.5 06/20/2012 2127   AST 14 (L) 11/09/2020 1651   AST 17 06/20/2012 2127   ALT 9 11/09/2020 1651   ALT 27 06/20/2012 2127   ALKPHOS 78 11/09/2020 1651   ALKPHOS 127 06/20/2012 2127   BILITOT 0.5 11/09/2020 1651   BILITOT 0.3 06/20/2012 2127   GFRNONAA >60 05/28/2021 0844   GFRNONAA >60 06/20/2012 2127   GFRAA >60 06/20/2012 2127     No results found.     Assessment & Plan:   1. Atherosclerosis of native artery of left lower extremity with rest pain (HCC) Recommend:  The patient has evidence of severe atherosclerotic changes of both lower extremities with rest pain that is associated with preulcerative changes and impending tissue loss of the left foot.  This represents a limb threatening ischemia and places the patient at the risk for left limb loss.  Patient should undergo angiography of the left lower extremity with the hope for intervention for limb salvage.  The risks and benefits as well as the alternative therapies was discussed in detail with the patient.  All questions were answered.  Patient agrees to proceed with left lower extremity angiography.  The patient will follow up with me in the office after the procedure.       2. Mixed hyperlipidemia Continue statin as ordered and reviewed, no changes at this time   3. Chronic mesenteric ischemia (HCC) Today noninvasive studies show no significant stenosis of her mesenteric vessels.  Previously placed SMA stent is patent.  Currently no role for intervention.  We will have patient return for follow-up in 1 year  4. Closed fracture of left  foot, initial encounter Is difficult to determine if the pain is from inflow disease or from pain related to her foot itself or even possible back etiology.  Patient will continue to follow with podiatry.   Current Outpatient Medications on File Prior to Visit  Medication Sig Dispense Refill   ACCU-CHEK AVIVA PLUS test strip      Accu-Chek Softclix Lancets lancets      albuterol (VENTOLIN HFA) 108 (90 Base) MCG/ACT inhaler Inhale 2 puffs into the lungs every 4 (four) hours as needed for wheezing or shortness of breath.     Alcohol Swabs (B-D SINGLE USE SWABS REGULAR) PADS      aspirin EC 81 MG tablet Take 81 mg by mouth daily.     atorvastatin (LIPITOR) 80 MG tablet Take 80 mg by mouth daily.     Blood Glucose Calibration (ACCU-CHEK AVIVA) SOLN      Blood Glucose Monitoring Suppl (ACCU-CHEK AVIVA PLUS) w/Device KIT      cholecalciferol (VITAMIN D) 1000 units tablet Take 1,000 Units by mouth daily.     clopidogrel (PLAVIX) 75 MG tablet Take 1 tablet (75 mg total) by mouth daily. 30 tablet 6   donepezil (ARICEPT) 5 MG tablet Take 5 mg by mouth at bedtime.     escitalopram (LEXAPRO) 20 MG tablet Take 1 tablet (20 mg total) by mouth daily. 90 tablet 0   fluticasone (FLONASE) 50 MCG/ACT nasal spray Place 1 spray into both nostrils daily as needed for allergies.     Fluticasone-Salmeterol (ADVAIR) 250-50 MCG/DOSE AEPB Inhale  1 puff into the lungs daily.     furosemide (LASIX) 20 MG tablet Take 20 mg by mouth daily.     Insulin Syringe-Needle U-100 30G X 5/16" 1 ML MISC      LEVEMIR FLEXTOUCH 100 UNIT/ML FlexPen Inject 18 Units into the skin at bedtime.     losartan (COZAAR) 50 MG tablet Take 50 mg by mouth daily.     metFORMIN (GLUCOPHAGE) 500 MG tablet Take 500 mg by mouth daily.     metoprolol succinate (TOPROL-XL) 25 MG 24 hr tablet Take 25 mg by mouth daily.     Multiple Vitamins-Minerals (WOMENS MULTI VITAMIN & MINERAL PO) Take 1 tablet by mouth every morning.     pregabalin (LYRICA) 50 MG  capsule Take 50 mg by mouth 3 (three) times daily.     traZODone (DESYREL) 50 MG tablet TAKE 1/2 TO 1 TABLET AT BEDTIME AS NEEDED FOR SLEEP (Patient taking differently: Take 25 mg by mouth at bedtime as needed for sleep.) 90 tablet 1   JARDIANCE 10 MG TABS tablet Take 10 mg by mouth daily. (Patient not taking: Reported on 03/19/2022)     oxyCODONE-acetaminophen (PERCOCET/ROXICET) 5-325 MG tablet Take 1 tablet by mouth every 6 (six) hours as needed for severe pain. (Patient not taking: Reported on 03/19/2022) 20 tablet 0   No current facility-administered medications on file prior to visit.    There are no Patient Instructions on file for this visit. No follow-ups on file.   Kris Hartmann, NP

## 2022-03-19 NOTE — Progress Notes (Signed)
Subjective:    Patient ID: Heather [name], female    DOB: Mar 29, 1951, 71 y.o.   MRN: [id number] Chief Complaint  Patient presents with   Follow-up    Ultrasound follow up    Heather [name] is a 71 year old female who returns today for follow-up evaluation of her ABIs as well as her mesenteric ischemia.  She denies any stomach pain.  She denies any nausea or vomiting or food phobia.  She does however note pain in her left lower extremity.  She notes the pain is from her foot and it travels up towards her knee and thigh area.  She notes it is a burning stinging numbness.  She notes she also has continued significant pain from her recent broken foot.  Today noninvasive studies show an ABI of 1.05 on the right and 0.93 on the left.  These ABIs are fairly consistent with her previous studies however she has significantly decreased TBI's.  Lower extremity arterial duplex also notes monophasic waveforms throughout the right lower extremity with primarily monophasic waveforms throughout the left as well.  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    Review of Systems  Musculoskeletal:  Positive for arthralgias and gait problem.  All other systems reviewed and are negative.      Objective:   Physical Exam Vitals reviewed.  HENT:     Head: Normocephalic.  Cardiovascular:     Rate and Rhythm: Normal rate.     Comments: Not able to palpate pedal pulses due to broken foot but notable by Doppler bilaterally Pulmonary:     Effort: Pulmonary effort is normal.  Skin:    General: Skin is warm and dry.  Neurological:     Mental Status: She is alert and oriented to person, place, and time.  Psychiatric:        Mood and Affect: Mood normal.        Behavior: Behavior normal.        Thought Content: Thought content normal.        Judgment: Judgment normal.     BP (!) 161/81 (BP Location: Right Arm)   Pulse 77   Resp 16   Wt 127 lb (57.6 kg)   BMI  24.80 kg/m   Past Medical History:  Diagnosis Date   Anxiety    Bipolar disorder ([place])    Cancer ([place])    skin   Depression    Diabetes mellitus    Diabetes mellitus, type II ([place])    History of artificial heart valve 2018   Aortic valve   Schizophrenia ([facility])    Vertigo     Social History   Socioeconomic History   Marital status: Married    Spouse name: rowland   Number of children: 2   Years of education: Not on file   Highest education level: High school graduate  Occupational History   Not on file  Tobacco Use   Smoking status: Every Day    Packs/day: 1.50    Types: Cigarettes   Smokeless tobacco: Never  Vaping Use   Vaping Use: Never used  Substance and Sexual Activity   Alcohol use: Not Currently   Drug use: Not Currently   Sexual activity: Not Currently  Other Topics Concern   Not on file  Social History Narrative   Lives at home with [employer]   Social Determinants of Health   Financial Resource Strain: Medium Risk (10/19/2018)   Overall Financial Resource Strain (  CARDIA)    Difficulty of Paying Living Expenses: Somewhat hard  Food Insecurity: Food Insecurity Present (10/19/2018)   Hunger Vital Sign    Worried About Running Out of Food in the Last Year: Often true    Ran Out of Food in the Last Year: Often true  Transportation Needs: No Transportation Needs (10/19/2018)   PRAPARE - Hydrologist (Medical): No    Lack of Transportation (Non-Medical): No  Physical Activity: Inactive (10/19/2018)   Exercise Vital Sign    Days of Exercise per Week: 0 days    Minutes of Exercise per Session: 0 min  Stress: Not on file  Social Connections: Unknown (10/19/2018)   Social Connection and Isolation Panel [NHANES]    Frequency of Communication with Friends and Family: Not on file    Frequency of Social Gatherings with Friends and Family: Not on file    Attends Religious Services: More than 4 times per year    Active Member of [employer] or  Organizations: No    Attends Archivist Meetings: Never    Marital Status: Married  Human resources officer Violence: Not At Risk (10/19/2018)   Humiliation, Afraid, Rape, and Kick questionnaire    Fear of Current or Ex-Partner: No    Emotionally Abused: No    Physically Abused: No    Sexually Abused: No    Past Surgical History:  Procedure Laterality Date   ABDOMINAL HYSTERECTOMY     BACK SURGERY     CARDIAC VALVE REPLACEMENT     CHOLECYSTECTOMY     HEMORRHOID SURGERY     LOWER EXTREMITY ANGIOGRAPHY Right 04/12/2021   Procedure: Lower Extremity Angiography;  Surgeon: [name], MD;  Location: [facility];  Service: Cardiovascular;  Laterality: Right;   LOWER EXTREMITY ANGIOGRAPHY Left 05/28/2021   Procedure: LOWER EXTREMITY ANGIOGRAPHY;  Surgeon: [name], MD;  Location: [facility];  Service: Cardiovascular;  Laterality: Left;   TUBAL LIGATION     VISCERAL ANGIOGRAPHY N/A 11/13/2020   Procedure: VISCERAL ANGIOGRAPHY;  Surgeon: [name], MD;  Location: [facility];  Service: Cardiovascular;  Laterality: N/A;    Family History  Problem Relation Age of Onset   Mental illness Mother    Breast cancer Neg Hx     Allergies  Allergen Reactions   Iodinated Contrast Media Hives   Sulfa Antibiotics Hives   Shellfish Allergy Rash and Other (See Comments)    Scallops specifically cause VOMITING Scallops specifically cause VOMITING        Latest Ref Rng & Units 11/15/2020    5:39 AM 11/14/2020    5:43 AM 11/13/2020    5:08 AM  CBC  WBC 4.0 - 10.5 K/uL 8.6  7.8  9.2   Hemoglobin 12.0 - 15.0 g/dL 8.5  9.6  9.6   Hematocrit 36.0 - 46.0 % 26.5  29.7  30.3   Platelets 150 - 400 K/uL 324  401  398       CMP     Component Value Date/Time   NA 141 11/15/2020 0539   NA 135 (L) 06/20/2012 2127   K 3.8 11/15/2020 0539   K 4.0 06/20/2012 2127   CL 115 (H) 11/15/2020 0539   CL 100 06/20/2012 2127   CO2 23 11/15/2020 0539    CO2 27 06/20/2012 2127   GLUCOSE 182 (H) 11/15/2020 0539   GLUCOSE 250 (H) 06/20/2012 2127   BUN 33 (H) 05/28/2021 7035  BUN 20 (H) 06/20/2012 2127   CREATININE 0.93 05/28/2021 0844   CREATININE 0.60 06/20/2012 2127   CALCIUM 8.3 (L) 11/15/2020 0539   CALCIUM 8.8 06/20/2012 2127   PROT 7.1 11/09/2020 1651   PROT 6.7 06/20/2012 2127   ALBUMIN 3.4 (L) 11/09/2020 1651   ALBUMIN 3.5 06/20/2012 2127   AST 14 (L) 11/09/2020 1651   AST 17 06/20/2012 2127   ALT 9 11/09/2020 1651   ALT 27 06/20/2012 2127   ALKPHOS 78 11/09/2020 1651   ALKPHOS 127 06/20/2012 2127   BILITOT 0.5 11/09/2020 1651   BILITOT 0.3 06/20/2012 2127   GFRNONAA >60 05/28/2021 0844   GFRNONAA >60 06/20/2012 2127   GFRAA >60 06/20/2012 2127     No results found.     Assessment & Plan:   1. Atherosclerosis of native artery of left lower extremity with rest pain (HCC) Recommend:  The patient has evidence of severe atherosclerotic changes of both lower extremities with rest pain that is associated with preulcerative changes and impending tissue loss of the left foot.  This represents a limb threatening ischemia and places the patient at the risk for left limb loss.  Patient should undergo angiography of the left lower extremity with the hope for intervention for limb salvage.  The risks and benefits as well as the alternative therapies was discussed in detail with the patient.  All questions were answered.  Patient agrees to proceed with left lower extremity angiography.  The patient will follow up with me in the office after the procedure.       2. Mixed hyperlipidemia Continue statin as ordered and reviewed, no changes at this time   3. Chronic mesenteric ischemia (HCC) Today noninvasive studies show no significant stenosis of her mesenteric vessels.  Previously placed SMA stent is patent.  Currently no role for intervention.  We will have patient return for follow-up in 1 year  4. Closed fracture of left  foot, initial encounter Is difficult to determine if the pain is from inflow disease or from pain related to her foot itself or even possible back etiology.  Patient will continue to follow with podiatry.   Current Outpatient Medications on File Prior to Visit  Medication Sig Dispense Refill   ACCU-CHEK AVIVA PLUS test strip      Accu-Chek Softclix Lancets lancets      albuterol (VENTOLIN HFA) 108 (90 Base) MCG/ACT inhaler Inhale 2 puffs into the lungs every 4 (four) hours as needed for wheezing or shortness of breath.     Alcohol Swabs (B-D SINGLE USE SWABS REGULAR) PADS      aspirin EC 81 MG tablet Take 81 mg by mouth daily.     atorvastatin (LIPITOR) 80 MG tablet Take 80 mg by mouth daily.     Blood Glucose Calibration (ACCU-CHEK AVIVA) SOLN      Blood Glucose Monitoring Suppl (ACCU-CHEK AVIVA PLUS) w/Device KIT      cholecalciferol (VITAMIN D) 1000 units tablet Take 1,000 Units by mouth daily.     clopidogrel (PLAVIX) 75 MG tablet Take 1 tablet (75 mg total) by mouth daily. 30 tablet 6   donepezil (ARICEPT) 5 MG tablet Take 5 mg by mouth at bedtime.     escitalopram (LEXAPRO) 20 MG tablet Take 1 tablet (20 mg total) by mouth daily. 90 tablet 0   fluticasone (FLONASE) 50 MCG/ACT nasal spray Place 1 spray into both nostrils daily as needed for allergies.     Fluticasone-Salmeterol (ADVAIR) 250-50 MCG/DOSE AEPB Inhale  1 puff into the lungs daily.     furosemide (LASIX) 20 MG tablet Take 20 mg by mouth daily.     Insulin Syringe-Needle U-100 30G X 5/16" 1 ML MISC      LEVEMIR FLEXTOUCH 100 UNIT/ML FlexPen Inject 18 Units into the skin at bedtime.     losartan (COZAAR) 50 MG tablet Take 50 mg by mouth daily.     metFORMIN (GLUCOPHAGE) 500 MG tablet Take 500 mg by mouth daily.     metoprolol succinate (TOPROL-XL) 25 MG 24 hr tablet Take 25 mg by mouth daily.     Multiple Vitamins-Minerals (WOMENS MULTI VITAMIN & MINERAL PO) Take 1 tablet by mouth every morning.     pregabalin (LYRICA) 50 MG  capsule Take 50 mg by mouth 3 (three) times daily.     traZODone (DESYREL) 50 MG tablet TAKE 1/2 TO 1 TABLET AT BEDTIME AS NEEDED FOR SLEEP (Patient taking differently: Take 25 mg by mouth at bedtime as needed for sleep.) 90 tablet 1   JARDIANCE 10 MG TABS tablet Take 10 mg by mouth daily. (Patient not taking: Reported on 03/19/2022)     oxyCODONE-acetaminophen (PERCOCET/ROXICET) 5-325 MG tablet Take 1 tablet by mouth every 6 (six) hours as needed for severe pain. (Patient not taking: Reported on 03/19/2022) 20 tablet 0   No current facility-administered medications on file prior to visit.    There are no Patient Instructions on file for this visit. No follow-ups on file.   Kris Hartmann, NP

## 2022-03-20 ENCOUNTER — Telehealth (INDEPENDENT_AMBULATORY_CARE_PROVIDER_SITE_OTHER): Payer: Self-pay

## 2022-03-20 NOTE — Telephone Encounter (Signed)
Spoke with the patient and she is scheduled with Dr. Delana Meyer on 03/25/22 with a 9:00 am arrival time to the [facility] for a LLE angio. Pre-procedure instructions were discussed and will be mailed.

## 2022-03-25 ENCOUNTER — Ambulatory Visit
Admission: RE | Admit: 2022-03-25 | Discharge: 2022-03-25 | Disposition: A | Discharge status: Home / Self Care | Payer: Medicare HMO | Attending: Vascular Surgery | Admitting: Vascular Surgery

## 2022-03-25 ENCOUNTER — Encounter
Admission: RE | Disposition: A | Discharge status: Home / Self Care | Payer: Self-pay | Source: Home / Self Care | Attending: Vascular Surgery

## 2022-03-25 ENCOUNTER — Other Ambulatory Visit (INDEPENDENT_AMBULATORY_CARE_PROVIDER_SITE_OTHER): Payer: Self-pay | Admitting: Vascular Surgery

## 2022-03-25 DIAGNOSIS — Z794 Long term (current) use of insulin: Secondary | ICD-10-CM | POA: Insufficient documentation

## 2022-03-25 DIAGNOSIS — Z7984 Long term (current) use of oral hypoglycemic drugs: Secondary | ICD-10-CM | POA: Insufficient documentation

## 2022-03-25 DIAGNOSIS — I70222 Atherosclerosis of native arteries of extremities with rest pain, left leg: Secondary | ICD-10-CM | POA: Diagnosis not present

## 2022-03-25 DIAGNOSIS — I70229 Atherosclerosis of native arteries of extremities with rest pain, unspecified extremity: Secondary | ICD-10-CM

## 2022-03-25 DIAGNOSIS — F17210 Nicotine dependence, cigarettes, uncomplicated: Secondary | ICD-10-CM | POA: Insufficient documentation

## 2022-03-25 DIAGNOSIS — E1151 Type 2 diabetes mellitus with diabetic peripheral angiopathy without gangrene: Secondary | ICD-10-CM | POA: Insufficient documentation

## 2022-03-25 DIAGNOSIS — K551 Chronic vascular disorders of intestine: Secondary | ICD-10-CM | POA: Insufficient documentation

## 2022-03-25 DIAGNOSIS — Z95828 Presence of other vascular implants and grafts: Secondary | ICD-10-CM

## 2022-03-25 DIAGNOSIS — I700 Atherosclerosis of aorta: Secondary | ICD-10-CM

## 2022-03-25 DIAGNOSIS — X58XXXA Exposure to other specified factors, initial encounter: Secondary | ICD-10-CM | POA: Insufficient documentation

## 2022-03-25 DIAGNOSIS — I70201 Unspecified atherosclerosis of native arteries of extremities, right leg: Secondary | ICD-10-CM

## 2022-03-25 DIAGNOSIS — E782 Mixed hyperlipidemia: Secondary | ICD-10-CM | POA: Diagnosis not present

## 2022-03-25 DIAGNOSIS — I708 Atherosclerosis of other arteries: Secondary | ICD-10-CM | POA: Diagnosis not present

## 2022-03-25 DIAGNOSIS — S92902A Unspecified fracture of left foot, initial encounter for closed fracture: Secondary | ICD-10-CM | POA: Diagnosis not present

## 2022-03-25 HISTORY — PX: LOWER EXTREMITY ANGIOGRAPHY: CATH118251

## 2022-03-25 LAB — CREATININE, SERUM
Creatinine, Ser: 1.08 mg/dL — ABNORMAL HIGH (ref 0.44–1.00)
GFR, Estimated: 56 mL/min — ABNORMAL LOW (ref >60)

## 2022-03-25 LAB — BUN: BUN: 29 mg/dL — ABNORMAL HIGH (ref 8–23)

## 2022-03-25 LAB — GLUCOSE, CAPILLARY
Glucose-Capillary: 290 mg/dL — ABNORMAL HIGH (ref 70–99)
Glucose-Capillary: 320 mg/dL — ABNORMAL HIGH (ref 70–99)

## 2022-03-25 SURGERY — LOWER EXTREMITY ANGIOGRAPHY
Anesthesia: Moderate Sedation | Site: Leg Lower | Laterality: Left

## 2022-03-25 MED ORDER — FENTANYL CITRATE (PF) 100 MCG/2ML IJ SOLN
INTRAMUSCULAR | Status: DC | PRN
  Administered 2022-03-25 (×2): 50 ug via INTRAVENOUS

## 2022-03-25 MED ORDER — STERILE WATER FOR INJECTION IJ SOLN
INTRAMUSCULAR | Status: AC
  Filled 2022-03-25: qty 10

## 2022-03-25 MED ORDER — HYDRALAZINE HCL 20 MG/ML IJ SOLN: 5.0000 mg | INTRAMUSCULAR | Status: DC | PRN

## 2022-03-25 MED ORDER — SODIUM CHLORIDE 0.9 % IV SOLN
INTRAVENOUS | Status: DC
Start: 2022-03-25 — End: 2022-03-25

## 2022-03-25 MED ORDER — HYDROMORPHONE HCL 1 MG/ML IJ SOLN: 1.0000 mg | Freq: Once | INTRAMUSCULAR | Status: DC | PRN

## 2022-03-25 MED ORDER — FAMOTIDINE 20 MG PO TABS: 40.0000 mg | ORAL_TABLET | Freq: Once | ORAL | Status: AC | PRN

## 2022-03-25 MED ORDER — CEFAZOLIN SODIUM-DEXTROSE 2-4 GM/100ML-% IV SOLN: 2.0000 g | INTRAVENOUS | Status: AC

## 2022-03-25 MED ORDER — FAMOTIDINE 20 MG PO TABS
ORAL_TABLET | ORAL | Status: AC
  Administered 2022-03-25: 40 mg via ORAL
  Filled 2022-03-25: qty 2

## 2022-03-25 MED ORDER — MORPHINE SULFATE (PF) 4 MG/ML IV SOLN: 2.0000 mg | INTRAVENOUS | Status: DC | PRN

## 2022-03-25 MED ORDER — FENTANYL CITRATE PF 50 MCG/ML IJ SOSY
PREFILLED_SYRINGE | INTRAMUSCULAR | Status: AC
  Filled 2022-03-25: qty 2

## 2022-03-25 MED ORDER — METHYLPREDNISOLONE SODIUM SUCC 125 MG IJ SOLR: 125.0000 mg | Freq: Once | INTRAMUSCULAR | Status: AC | PRN

## 2022-03-25 MED ORDER — HEPARIN SODIUM (PORCINE) 1000 UNIT/ML IJ SOLN
INTRAMUSCULAR | Status: DC | PRN
  Administered 2022-03-25: 5000 [IU] via INTRAVENOUS

## 2022-03-25 MED ORDER — OXYCODONE HCL 5 MG PO TABS: 5.0000 mg | ORAL_TABLET | ORAL | Status: DC | PRN

## 2022-03-25 MED ORDER — MIDAZOLAM HCL 2 MG/ML PO SYRP: 8.0000 mg | ORAL_SOLUTION | Freq: Once | ORAL | Status: DC | PRN

## 2022-03-25 MED ORDER — SODIUM CHLORIDE 0.9% FLUSH: 3.0000 mL | INTRAVENOUS | Status: DC | PRN

## 2022-03-25 MED ORDER — IODIXANOL 320 MG/ML IV SOLN
INTRAVENOUS | Status: DC | PRN
  Administered 2022-03-25: 85 mL

## 2022-03-25 MED ORDER — MIDAZOLAM HCL 2 MG/2ML IJ SOLN
INTRAMUSCULAR | Status: DC | PRN
  Administered 2022-03-25: 2 mg via INTRAVENOUS
  Administered 2022-03-25: 1 mg via INTRAVENOUS

## 2022-03-25 MED ORDER — DIPHENHYDRAMINE HCL 50 MG/ML IJ SOLN
INTRAMUSCULAR | Status: AC
  Administered 2022-03-25: 50 mg via INTRAVENOUS
  Filled 2022-03-25: qty 1

## 2022-03-25 MED ORDER — HEPARIN SODIUM (PORCINE) 1000 UNIT/ML IJ SOLN
INTRAMUSCULAR | Status: AC
  Filled 2022-03-25: qty 10

## 2022-03-25 MED ORDER — SODIUM CHLORIDE 0.9 % IV SOLN
INTRAVENOUS | Status: DC
  Administered 2022-03-25: 1000 mL via INTRAVENOUS

## 2022-03-25 MED ORDER — DIPHENHYDRAMINE HCL 50 MG/ML IJ SOLN: 50.0000 mg | Freq: Once | INTRAMUSCULAR | Status: AC | PRN

## 2022-03-25 MED ORDER — ACETAMINOPHEN 325 MG PO TABS: 650.0000 mg | ORAL_TABLET | ORAL | Status: DC | PRN

## 2022-03-25 MED ORDER — ONDANSETRON HCL 4 MG/2ML IJ SOLN: 4.0000 mg | Freq: Four times a day (QID) | INTRAMUSCULAR | Status: DC | PRN

## 2022-03-25 MED ORDER — MIDAZOLAM HCL 2 MG/2ML IJ SOLN
INTRAMUSCULAR | Status: AC
  Filled 2022-03-25: qty 4

## 2022-03-25 MED ORDER — SODIUM CHLORIDE 0.9% FLUSH
3.0000 mL | Freq: Two times a day (BID) | INTRAVENOUS | Status: DC
Start: 2022-03-25 — End: 2022-03-25

## 2022-03-25 MED ORDER — CEFAZOLIN SODIUM-DEXTROSE 2-4 GM/100ML-% IV SOLN
INTRAVENOUS | Status: AC
  Administered 2022-03-25: 2 g via INTRAVENOUS
  Filled 2022-03-25: qty 100

## 2022-03-25 MED ORDER — LABETALOL HCL 5 MG/ML IV SOLN: 10.0000 mg | INTRAVENOUS | Status: DC | PRN

## 2022-03-25 MED ORDER — METHYLPREDNISOLONE SODIUM SUCC 125 MG IJ SOLR
INTRAMUSCULAR | Status: AC
  Administered 2022-03-25: 125 mg via INTRAVENOUS
  Filled 2022-03-25: qty 2

## 2022-03-25 MED ORDER — SODIUM CHLORIDE 0.9 % IV SOLN: 250.0000 mL | INTRAVENOUS | Status: DC | PRN

## 2022-03-25 SURGICAL SUPPLY — 10 items
CATH ANGIO 5F PIGTAIL 65CM (CATHETERS) ×1 IMPLANT
COVER PROBE U/S 5X48 (MISCELLANEOUS) ×1 IMPLANT
DEVICE STARCLOSE SE CLOSURE (Vascular Products) ×1 IMPLANT
NDL ENTRY 21GA 7CM ECHOTIP (NEEDLE) IMPLANT
NEEDLE ENTRY 21GA 7CM ECHOTIP (NEEDLE) ×2 IMPLANT
PACK ANGIOGRAPHY (CUSTOM PROCEDURE TRAY) ×2 IMPLANT
SET INTRO CAPELLA COAXIAL (SET/KITS/TRAYS/PACK) ×1 IMPLANT
SHEATH BRITE TIP 5FRX11 (SHEATH) ×1 IMPLANT
TUBING CONTRAST HIGH PRESS 72 (TUBING) ×1 IMPLANT
WIRE GUIDERIGHT .035X150 (WIRE) ×1 IMPLANT

## 2022-03-25 NOTE — Progress Notes (Signed)
Based on angiogram today she needs bilateral femoral endarterectomies with stenting of the SFA.  Therefore needs cardiac evaluation.

## 2022-03-25 NOTE — Op Note (Signed)
[facility]  Percutaneous Study/Intervention Procedural Note   Date of Surgery: 03/25/2022,11:09 AM  Surgeon:[place] Devine, Dolores Lory   Pre-operative Diagnosis: Atherosclerotic occlusive disease bilateral lower extremities with rest pain of the left lower extremity  Post-operative diagnosis:  Same  Procedure(s) Performed:  1.  Abdominal aortogram  2.  Bilateral oblique views of the pelvis  3.  Bilateral lower extremity distal runoff  4.  StarClose right common femoral    Anesthesia: Conscious sedation was administered by the interventional radiology RN under my direct supervision. IV Versed plus fentanyl were utilized. Continuous ECG, pulse oximetry and blood pressure was monitored throughout the entire procedure. Conscious sedation was administered for a total of 29 minutes.  Sheath: 5 French 11 cm Pinnacle sheath right common femoral retrograde  Contrast: 85 cc   Fluoroscopy Time: 1.8 minutes  Indications: Patient presents with nonpalpable pulses and noninvasive studies suggesting worsening of her atherosclerotic occlusive disease.  She is not complaining of persistent pain in the left lower extremity.  This places her at risk for limb loss.  Risks and benefits for angiography in the hope for intervention reviewed all questions answered patient agrees to proceed  Procedure:  Heather [name] a 71 y.o. female who was identified and appropriate procedural time out was performed.  The patient was then placed supine on the table and prepped and draped in the usual sterile fashion.  Ultrasound was used to evaluate the right common femoral artery.  It was echolucent and pulsatile indicating it is patent .  An ultrasound image was acquired for the permanent record.  A micropuncture needle was used to access the right common femoral artery under direct ultrasound guidance.  The microwire was then advanced under fluoroscopic guidance without difficulty followed by the  micro-sheath.  A 0.035 J wire was advanced without resistance and a 5Fr sheath was placed.    Pigtail catheter was positioned at the level of T12 and AP projection of the aorta was obtained.  Pigtail catheter was repositioned to above the bifurcation and bilateral oblique views of the pelvis are obtained.  Detectors then return to the AP projection bilateral lower extremity runoff is obtained.  After review these images oblique view of the right groin is obtained and StarClose device was deployed.  Findings:   Aortogram: The abdominal aorta is opacified with a bolus injection of contrast.  Is diffusely diseased but there are no hemodynamically significant stenoses.  The previously placed kissing iliac stents appear to be widely patent.  The common and external iliac arteries are diffusely diseased but there are no hemodynamically significant stenoses noted bilaterally.  Right Lower Extremity: The right common femoral demonstrates greater than 70% stenosis from its midportion distally.  The profunda femoris appears to be diffusely diseased but free of hemodynamically significant stenosis.  The superficial femoral artery demonstrates numerous greater than 70% stenoses throughout its entire length including the popliteal.  Previously placed stents are patent but there are multiple hemodynamically significant lesions noted throughout.  The distal popliteal appears free of hemodynamically significant stenosis and the trifurcation is patent.  Anterior tibial is dominant vessel to the foot and is patent throughout without hemodynamically significant stenosis.  There appears to be occlusion of the distal dorsalis pedis.  The tibioperoneal trunk demonstrates a greater than 60% stenosis in its midportion which is focal 4 to 5 mm in length.  Distal to this lesion the posterior tibial and peroneal are free of hemodynamically significant stenoses.  Left Lower Extremity:  The  left common femoral demonstrates greater than  90% stenosis from its proximal portion to the distal portion.  The profunda femoris appears to be diffusely diseased but free of hemodynamically significant stenosis.  The superficial femoral artery demonstrates numerous greater than 70% stenoses throughout its entire length including the popliteal just distal to the stent there is a string sign extending over a distance of 4 to 5 cm.  Previously placed stents are patent but there are multiple hemodynamically significant lesions noted throughout.  The distal popliteal appears free of hemodynamically significant stenosis and the trifurcation is patent and there is three-vessel runoff to the foot.  Anterior tibial is dominant vessel to the foot and is patent throughout without hemodynamically significant stenosis.     Disposition: Patient was taken to the recovery room in stable condition having tolerated the procedure well.  Heather [name] 03/25/2022,11:09 AM

## 2022-03-25 NOTE — Interval H&P Note (Signed)
History and Physical Interval Note:  03/25/2022 10:03 AM  Heather [name]  has presented today for surgery, with the diagnosis of LLE Angio   BARD   ASO w rest pain.  The various methods of treatment have been discussed with the patient and family. After consideration of risks, benefits and other options for treatment, the patient has consented to  Procedure(s): Lower Extremity Angiography (Left) as a surgical intervention.  The patient's history has been reviewed, patient examined, no change in status, stable for surgery.  I have reviewed the patient's chart and labs.  Questions were answered to the patient's satisfaction.     [name]

## 2022-03-26 ENCOUNTER — Encounter: Payer: Self-pay | Admitting: Vascular Surgery

## 2022-03-28 ENCOUNTER — Ambulatory Visit: Payer: Medicare HMO | Admitting: Cardiovascular Disease

## 2022-03-28 ENCOUNTER — Encounter: Payer: Self-pay | Admitting: Cardiovascular Disease

## 2022-03-28 NOTE — Progress Notes (Deleted)
Cardiology Office Note  Date:  03/28/2022   ID:  Heather [name], DOB [birth year], MRN [id number]  PCP:  Heather Pac, FNP   No chief complaint on file.   HPI:  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 Repeat cath prior to TAVR showed 50% ISR of mLAD stent  Recent angiogram March 25, 2022 90% stenosis left common femoral, SFA greater than 70% stenosis, string sign distal to the stent  Based on results above plan is for bilateral femoral endarterectomies with stenting of the SFA  Cardiac catheterization September 29, 2017 1. Severe aortic regurgitation  2. No significant aortic stenosis - peak to peak systolic gradient was 15  mm Hg in the setting of severe AR  3. Septal hypokinesis with preserved LV systolic function (estimated LVEF  = 50%).  4. Coronary artery disease including 50% instent restenosis in the mid-LAD  and 70% distal LAD stenosis. RCA is small and diffusely diseased  5. Elevated left ventricular filling pressures: LVEDP = 25 mm Hg and mean  PCWP = 24 mm Hg  6. 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  PSH:    Past Surgical History:  Procedure Laterality Date   ABDOMINAL HYSTERECTOMY     BACK SURGERY     CARDIAC VALVE REPLACEMENT     CHOLECYSTECTOMY     HEMORRHOID SURGERY     LOWER EXTREMITY ANGIOGRAPHY Right 04/12/2021   Procedure: Lower Extremity Angiography;  Surgeon: Heather Cabal, MD;  Location: [facility];  Service: Cardiovascular;  Laterality: Right;   LOWER EXTREMITY ANGIOGRAPHY Left 05/28/2021   Procedure: LOWER EXTREMITY ANGIOGRAPHY;  Surgeon: Heather Cabal, MD;  Location: [facility];  Service: Cardiovascular;  Laterality: Left;   LOWER EXTREMITY ANGIOGRAPHY Left 03/25/2022   Procedure: Lower Extremity Angiography;  Surgeon: Heather Cabal, MD;  Location: [facility];  Service: Cardiovascular;  Laterality: Left;   TUBAL LIGATION     VISCERAL ANGIOGRAPHY N/A 11/13/2020   Procedure: VISCERAL ANGIOGRAPHY;  Surgeon: Heather Cabal, MD;  Location: [facility];  Service: Cardiovascular;  Laterality: N/A;    Current Outpatient Medications  Medication Sig Dispense Refill   ACCU-CHEK AVIVA PLUS test strip      Accu-Chek Softclix Lancets lancets      albuterol (VENTOLIN HFA) 108 (90 Base) MCG/ACT inhaler Inhale 2 puffs into the lungs every 4 (four) hours as needed for wheezing or shortness of breath.     Alcohol Swabs (B-D SINGLE USE SWABS REGULAR) PADS      aspirin EC 81 MG  tablet Take 81 mg by mouth daily.     atorvastatin (LIPITOR) 80 MG tablet Take 80 mg by mouth daily.     Blood Glucose Calibration (ACCU-CHEK AVIVA) SOLN      Blood Glucose Monitoring Suppl (ACCU-CHEK AVIVA PLUS) w/Device KIT      cholecalciferol (VITAMIN D) 1000 units tablet Take 1,000 Units by mouth daily.     clopidogrel (PLAVIX) 75 MG tablet Take 1 tablet (75 mg total) by mouth daily. 30 tablet 6   donepezil (ARICEPT) 5 MG tablet Take 5 mg by mouth at bedtime. (Patient not  taking: Reported on 03/25/2022)     escitalopram (LEXAPRO) 20 MG tablet Take 1 tablet (20 mg total) by mouth daily. 90 tablet 0   fluticasone (FLONASE) 50 MCG/ACT nasal spray Place 1 spray into both nostrils daily as needed for allergies.     Fluticasone-Salmeterol (ADVAIR) 250-50 MCG/DOSE AEPB Inhale 1 puff into the lungs daily.     furosemide (LASIX) 20 MG tablet Take 20 mg by mouth daily.     Insulin Syringe-Needle U-100 30G X 5/16" 1 ML MISC      JARDIANCE 10 MG TABS tablet Take 10 mg by mouth daily. (Patient not taking: Reported on 03/19/2022)     LEVEMIR FLEXTOUCH 100 UNIT/ML FlexPen Inject 18 Units into the skin at bedtime.     losartan (COZAAR) 50 MG tablet Take 50 mg by mouth daily.     metFORMIN (GLUCOPHAGE) 500 MG tablet Take 500 mg by mouth daily.     metoprolol succinate (TOPROL-XL) 25 MG 24 hr tablet Take 25 mg by mouth daily.     Multiple Vitamins-Minerals (WOMENS MULTI VITAMIN & MINERAL PO) Take 1 tablet by mouth every morning.     oxyCODONE-acetaminophen (PERCOCET/ROXICET) 5-325 MG tablet Take 1 tablet by mouth every 6 (six) hours as needed for severe pain. (Patient not taking: Reported on 03/19/2022) 20 tablet 0   pregabalin (LYRICA) 50 MG capsule Take 50 mg by mouth 3 (three) times daily.     traMADol (ULTRAM) 50 MG tablet Take 1 tablet (50 mg total) by mouth every 6 (six) hours as needed. 15 tablet 0   traZODone (DESYREL) 50 MG tablet TAKE 1/2 TO 1 TABLET AT BEDTIME AS NEEDED FOR SLEEP (Patient taking differently: Take 25 mg by mouth at bedtime as needed for sleep.) 90 tablet 1   No current facility-administered medications for this visit.     Allergies:   Iodinated contrast media, Sulfa antibiotics, and Shellfish allergy   Social History:  The patient  reports that she has been smoking cigarettes. She has been smoking an average of 1.5 packs per day. She has never used smokeless tobacco. She reports that she does not currently use alcohol. She reports that she does not  currently use drugs.   Family History:   family history includes Mental illness in her mother.    Review of Systems: ROS   PHYSICAL EXAM: VS:  There were no vitals taken for this visit. , BMI There is no height or weight on file to calculate BMI. GEN: Well nourished, well developed, in no acute distress HEENT: normal Neck: no JVD, carotid bruits, or masses Cardiac: RRR; no murmurs, rubs, or gallops,no edema  Respiratory:  clear to auscultation bilaterally, normal work of breathing GI: soft, nontender, nondistended, + BS MS: no deformity or atrophy Skin: warm and dry, no rash Neuro:  Strength and sensation are intact Psych: euthymic mood, full affect    Recent Labs: 03/25/2022: BUN  29; Creatinine, Ser 1.08    Lipid Panel No results found for: "CHOL", "HDL", "LDLCALC", "TRIG"    Wt Readings from Last 3 Encounters:  03/25/22 127 lb (57.6 kg)  03/19/22 127 lb (57.6 kg)  02/13/22 125 lb (56.7 kg)       ASSESSMENT AND PLAN:  Problem List Items Addressed This Visit       Cardiology Problems   PAD (peripheral artery disease) (HCC)   CAD (coronary artery disease) - Primary     Other   S/P TAVR (transcatheter aortic valve replacement)   Other Visit Diagnoses     Preop cardiovascular exam            Disposition:   F/U  12 months   Total encounter time more than 30 minutes  Greater than 50% was spent in counseling and coordination of care with the patient    Signed, Esmond Plants, M.D., Ph.D. [place], [place]

## 2022-04-02 DIAGNOSIS — I70229 Atherosclerosis of native arteries of extremities with rest pain, unspecified extremity: Secondary | ICD-10-CM | POA: Insufficient documentation

## 2022-04-02 NOTE — Progress Notes (Signed)
MRN : [id number]  Heather [name] is a 71 y.o. (Mar 05, 1951) female who presents with chief complaint of check circulation.  History of Present Illness:   The patient returns to the office for followup and review status post angiogram with intervention on 03/25/2022.   Procedure: Diagnostic angiogram:  Based on angiogram today she needs bilateral femoral endarterectomies with stenting of the SFA.  Therefore she needs cardiac evaluation.  The patient notes no change in the lower extremity symptoms. No interval shortening of the patient's claudication distance but her rest pain symptoms persist. No new ulcers or wounds have occurred since the last visit.  There have been no significant changes to the patient's overall health care.  No documented history of amaurosis fugax or recent TIA symptoms. There are no recent neurological changes noted. No documented history of DVT, PE or superficial thrombophlebitis. The patient denies recent episodes of angina or shortness of breath.   Angiogram is reviewed with the patient.  Based on angiogram today she needs bilateral femoral endarterectomies with stenting of the SFA.    No outpatient medications have been marked as taking for the 04/03/22 encounter (Appointment) with Delana Meyer, Dolores Lory, MD.    Past Medical History:  Diagnosis Date   Anxiety    Bipolar disorder ([place])    Cancer ([place])    skin   Depression    Diabetes mellitus    Diabetes mellitus, type II ([place])    History of artificial heart valve 2018   Aortic valve   Schizophrenia ([place])    Vertigo     Past Surgical History:  Procedure Laterality Date   ABDOMINAL HYSTERECTOMY     BACK SURGERY     CARDIAC VALVE REPLACEMENT     CHOLECYSTECTOMY     HEMORRHOID SURGERY     LOWER EXTREMITY ANGIOGRAPHY Right 04/12/2021   Procedure: Lower Extremity Angiography;  Surgeon: [name], MD;  Location: [facility];  Service: Cardiovascular;  Laterality: Right;    LOWER EXTREMITY ANGIOGRAPHY Left 05/28/2021   Procedure: LOWER EXTREMITY ANGIOGRAPHY;  Surgeon: [name], MD;  Location: [facility];  Service: Cardiovascular;  Laterality: Left;   LOWER EXTREMITY ANGIOGRAPHY Left 03/25/2022   Procedure: Lower Extremity Angiography;  Surgeon: [name], MD;  Location: [facility];  Service: Cardiovascular;  Laterality: Left;   TUBAL LIGATION     VISCERAL ANGIOGRAPHY N/A 11/13/2020   Procedure: VISCERAL ANGIOGRAPHY;  Surgeon: [name], MD;  Location: [facility];  Service: Cardiovascular;  Laterality: N/A;    Social History Social History   Tobacco Use   Smoking status: Every Day    Packs/day: 1.50    Types: Cigarettes   Smokeless tobacco: Never  Vaping Use   Vaping Use: Never used  Substance Use Topics   Alcohol use: Not Currently   Drug use: Not Currently    Family History Family History  Problem Relation Age of Onset   Mental illness Mother    Breast cancer Neg Hx     Allergies  Allergen Reactions   Iodinated Contrast Media Hives   Sulfa Antibiotics Hives   Shellfish Allergy Rash and Other (See Comments)    Scallops specifically cause VOMITING Scallops specifically cause VOMITING      REVIEW OF SYSTEMS (Negative unless checked)  Constitutional: '[]'$ Weight loss  '[]'$ Fever  '[]'$ Chills Cardiac: '[]'$ Chest pain   '[]'$ Chest pressure   '[]'$ Palpitations   '[]'$   Shortness of breath when laying flat   '[]'$ Shortness of breath with exertion. Vascular:  '[x]'$ Pain in legs with walking   '[]'$ Pain in legs at rest  '[]'$ History of DVT   '[]'$ Phlebitis   '[]'$ Swelling in legs   '[]'$ Varicose veins   '[]'$ Non-healing ulcers Pulmonary:   '[]'$ Uses home oxygen   '[]'$ Productive cough   '[]'$ Hemoptysis   '[]'$ Wheeze  '[]'$ COPD   '[]'$ Asthma Neurologic:  '[]'$ Dizziness   '[]'$ Seizures   '[]'$ History of stroke   '[]'$ History of TIA  '[]'$ Aphasia   '[]'$ Vissual changes   '[]'$ Weakness or numbness in arm   '[]'$ Weakness or numbness in leg Musculoskeletal:   '[]'$ Joint swelling   '[]'$ Joint  pain   '[]'$ Low back pain Hematologic:  '[]'$ Easy bruising  '[]'$ Easy bleeding   '[]'$ Hypercoagulable state   '[]'$ Anemic Gastrointestinal:  '[]'$ Diarrhea   '[]'$ Vomiting  '[]'$ Gastroesophageal reflux/heartburn   '[]'$ Difficulty swallowing. Genitourinary:  '[]'$ Chronic kidney disease   '[]'$ Difficult urination  '[]'$ Frequent urination   '[]'$ Blood in urine Skin:  '[]'$ Rashes   '[]'$ Ulcers  Psychological:  '[]'$ History of anxiety   '[]'$  History of major depression.  Physical Examination  There were no vitals filed for this visit. There is no height or weight on file to calculate BMI. Gen: WD/WN, NAD Head: [place]/AT, No temporalis wasting.  Ear/Nose/Throat: Hearing grossly intact, nares w/o erythema or drainage Eyes: PER, EOMI, sclera nonicteric.  Neck: Supple, no masses.  No bruit or JVD.  Pulmonary:  Good air movement, no audible wheezing, no use of accessory muscles.  Cardiac: RRR, normal S1, S2, no Murmurs. Vascular:  mild trophic changes, no open wounds Vessel Right Left  Radial Palpable Palpable  PT Not Palpable Not Palpable  DP Not Palpable Not Palpable  Gastrointestinal: soft, non-distended. No guarding/no peritoneal signs.  Musculoskeletal: M/S 5/5 throughout.  No visible deformity.  Neurologic: CN 2-12 intact. Pain and light touch intact in extremities.  Symmetrical.  Speech is fluent. Motor exam as listed above. Psychiatric: Judgment intact, Mood & affect appropriate for pt's clinical situation. Dermatologic: No rashes or ulcers noted.  No changes consistent with cellulitis.   CBC Lab Results  Component Value Date   WBC 8.6 11/15/2020   HGB 8.5 (L) 11/15/2020   HCT 26.5 (L) 11/15/2020   MCV 92.0 11/15/2020   PLT 324 11/15/2020    BMET    Component Value Date/Time   NA 141 11/15/2020 0539   NA 135 (L) 06/20/2012 2127   K 3.8 11/15/2020 0539   K 4.0 06/20/2012 2127   CL 115 (H) 11/15/2020 0539   CL 100 06/20/2012 2127   CO2 23 11/15/2020 0539   CO2 27 06/20/2012 2127   GLUCOSE 182 (H) 11/15/2020 0539   GLUCOSE  250 (H) 06/20/2012 2127   BUN 29 (H) 03/25/2022 0930   BUN 20 (H) 06/20/2012 2127   CREATININE 1.08 (H) 03/25/2022 0930   CREATININE 0.60 06/20/2012 2127   CALCIUM 8.3 (L) 11/15/2020 0539   CALCIUM 8.8 06/20/2012 2127   GFRNONAA 56 (L) 03/25/2022 0930   GFRNONAA >60 06/20/2012 2127   GFRAA >60 06/20/2012 2127   Estimated Creatinine Clearance: 39.6 mL/min (A) (by C-G formula based on SCr of 1.08 mg/dL (H)).  COAG No results found for: "INR", "PROTIME"  Radiology PERIPHERAL VASCULAR CATHETERIZATION  Result Date: 03/25/2022 See surgical note for result.  VAS [place] LOWER EXTREMITY ARTERIAL DUPLEX  Result Date: 03/24/2022 LOWER EXTREMITY ARTERIAL DUPLEX STUDY Patient Name:  JODEL MAYHALL  Date of Exam:   03/19/2022 Medical Rec #: [id number]    Accession #:  [id number] Date of Birth: 03-Nov-1950    Patient Gender: F Patient Age:   83 years [facility] Procedure:      VAS [place] LOWER EXTREMITY ARTERIAL DUPLEX Referring Phys: Arna Medici BROWN --------------------------------------------------------------------------------   Current ABI: r = 1.03 ; l = .93 Performing Technologist: Concha Norway RVT  Examination Guidelines: A complete evaluation includes B-mode imaging, spectral Doppler, color Doppler, and power Doppler as needed of all accessible portions of each vessel. Bilateral testing is considered an integral part of a complete examination. Limited examinations for reoccurring indications may be performed as noted.  +-----------+--------+-----+--------+----------+--------+ RIGHT      PSV cm/sRatioStenosisWaveform  Comments +-----------+--------+-----+--------+----------+--------+ CFA Mid    181                  monophasic         +-----------+--------+-----+--------+----------+--------+ DFA        146                  monophasic         +-----------+--------+-----+--------+----------+--------+ SFA Prox   137                  monophasic          +-----------+--------+-----+--------+----------+--------+ SFA Mid    77                   monophasic         +-----------+--------+-----+--------+----------+--------+ SFA Distal 111                  monophasic         +-----------+--------+-----+--------+----------+--------+ POP Distal 73                   monophasic         +-----------+--------+-----+--------+----------+--------+ ATA Distal 65                   monophasic         +-----------+--------+-----+--------+----------+--------+ PTA Distal 32                   monophasic         +-----------+--------+-----+--------+----------+--------+ PERO Distal55                   monophasic         +-----------+--------+-----+--------+----------+--------+  +-----------+--------+-----+--------+----------+--------+ LEFT       PSV cm/sRatioStenosisWaveform  Comments +-----------+--------+-----+--------+----------+--------+ CFA Mid    171                  biphasic           +-----------+--------+-----+--------+----------+--------+ DFA        105                  biphasic           +-----------+--------+-----+--------+----------+--------+ SFA Prox   67                   monophasic         +-----------+--------+-----+--------+----------+--------+ SFA Mid    110                  monophasic         +-----------+--------+-----+--------+----------+--------+ SFA Distal 98                   monophasic         +-----------+--------+-----+--------+----------+--------+ POP Distal 75  monophasic         +-----------+--------+-----+--------+----------+--------+ ATA Distal 57                   monophasic         +-----------+--------+-----+--------+----------+--------+ PTA Distal 43                   monophasic         +-----------+--------+-----+--------+----------+--------+ PERO Distal29                   monophasic          +-----------+--------+-----+--------+----------+--------+  Summary: Right: Vessels and stents patent throughout. Moderate atherosclerosis throughout. Left: Vessels and stents patent throughout. Moderate atherosclerosis throughout. Flow is mostly monophasic suggesting possible inflow disease/CIA stent stenosis.  See table(s) above for measurements and observations. Electronically signed by [name] MD on 03/24/2022 at 12:56:47 PM.    Final    VAS [place] MESENTERIC  Result Date: 03/24/2022 ABDOMINAL VISCERAL Patient Name:  KAJSA BUTRUM  Date of Exam:   03/19/2022 Medical Rec #: [id number]    Accession #:    [id number] Date of Birth: [birth year]    Patient Gender: F Patient Age:   36 years Exam Location:  [facility] Procedure:      VAS [place] MESENTERIC Referring Phys: [id number] Kris Hartmann -------------------------------------------------------------------------------- Indications: sma stenosis and repair Vascular Interventions: 11/13/2020 SMA stent. Performing Technologist: Concha Norway RVT  Examination Guidelines: A complete evaluation includes B-mode imaging, spectral Doppler, color Doppler, and power Doppler as needed of all accessible portions of each vessel. Bilateral testing is considered an integral part of a complete examination. Limited examinations for reoccurring indications may be performed as noted.  Duplex Findings: +--------------------+--------+--------+------+--------+ Mesenteric          PSV cm/sEDV cm/sPlaqueComments +--------------------+--------+--------+------+--------+ Aorta Mid              52                          +--------------------+--------+--------+------+--------+ Celiac Artery Origin  176                          +--------------------+--------+--------+------+--------+ SMA Proximal          148                          +--------------------+--------+--------+------+--------+ SMA Mid               111                           +--------------------+--------+--------+------+--------+ CHA                   162                          +--------------------+--------+--------+------+--------+ Splenic                88                          +--------------------+--------+--------+------+--------+ IMA                   136                          +--------------------+--------+--------+------+--------+  Summary: Largest Aortic Diameter: 1.6 cm  Mesenteric: Normal Celiac artery , Superior Mesenteric artery, Inferior Mesenteric artery, Splenic artery and Hepatic artery findings. Moderate atherosclerosis throughout. Evidence of patent SMA stent.  *See table(s) above for measurements and observations.  Diagnosing physician: [name] MD  Electronically signed by [name] MD on 03/24/2022 at 12:56:38 PM.    Final    VAS [place] ABI WITH/WO TBI  Result Date: 03/24/2022  LOWER EXTREMITY DOPPLER STUDY Patient Name:  Nyala Kirchner  Date of Exam:   03/19/2022 Medical Rec #: [id number]    Accession #:    [id number] Date of Birth: [birth year]    Patient Gender: F Patient Age:   47 years Exam Location:  [facility] Procedure:      VAS [place] ABI WITH/WO TBI Referring Phys: --------------------------------------------------------------------------------  Indications: Peripheral artery disease. Other Factors: Lt toes broken x 8 weeks                04/12/2020 rt sfa-pop PTA and stents.  Comparison Study: 09/23/2021 Performing Technologist: Concha Norway RVT  Examination Guidelines: A complete evaluation includes at minimum, Doppler waveform signals and systolic blood pressure reading at the level of bilateral brachial, anterior tibial, and posterior tibial arteries, when vessel segments are accessible. Bilateral testing is considered an integral part of a complete examination. Photoelectric Plethysmograph (PPG) waveforms and toe systolic pressure readings are included as required and additional duplex testing as needed.  Limited examinations for reoccurring indications may be performed as noted.  ABI Findings: +---------+------------------+-----+----------+--------+ Right    Rt Pressure (mmHg)IndexWaveform  Comment  +---------+------------------+-----+----------+--------+ Brachial 151                                       +---------+------------------+-----+----------+--------+ ATA      158               1.05 monophasic         +---------+------------------+-----+----------+--------+ PTA      144               0.95 monophasic         +---------+------------------+-----+----------+--------+ Great Toe79                0.52 Abnormal           +---------+------------------+-----+----------+--------+ +---------+------------------+-----+----------+-------+ Left     Lt Pressure (mmHg)IndexWaveform  Comment +---------+------------------+-----+----------+-------+ Brachial 150                                      +---------+------------------+-----+----------+-------+ ATA      141               0.93 monophasic        +---------+------------------+-----+----------+-------+ PTA      123               0.81 monophasic        +---------+------------------+-----+----------+-------+ Great Toe82                0.54 Abnormal          +---------+------------------+-----+----------+-------+ +-------+-----------+-----------+------------+------------+ ABI/TBIToday's ABIToday's TBIPrevious ABIPrevious TBI +-------+-----------+-----------+------------+------------+ Right  1.05       .52        1.04        .80          +-------+-----------+-----------+------------+------------+ Left   .93        .  54        1.13        .73          +-------+-----------+-----------+------------+------------+ Left ABIs appear decreased compared to prior study on 08/2021. Bilateral TBIs appear decreased compared to prior study on 08/2021.  Summary: Right: Resting right ankle-brachial index is within  normal range. No evidence of significant right lower extremity arterial disease. The right toe-brachial index is abnormal. Left: Resting left ankle-brachial index indicates mild left lower extremity arterial disease. The left toe-brachial index is abnormal. *See table(s) above for measurements and observations.  Electronically signed by [name] MD on 03/24/2022 at 12:54:28 PM.    Final      Assessment/Plan 1. Atherosclerosis of native artery of both lower extremities with rest pain ([place])  Recommend:  The patient has evidence of severe atherosclerotic changes of both lower extremities associated with rest pain of both feet.  This represents a limb threatening ischemia and places the patient at a high risk for limb loss.  Angiography has been performed and the situation is not ideal for intervention.  Given this finding open surgical repair is recommended.   Patient should undergo arterial reconstruction, based on angiogram today she needs bilateral femoral endarterectomies with stenting of the SFA's bilaterally.  The reconstruction of the lower extremities is being done with the hope for limb salvage.    The risks and benefits as well as the alternative therapies was discussed in detail with the patient.  All questions were answered.  Patient agrees to proceed with open vascular surgical reconstruction.  The patient will follow up with me in the office after the procedure.     2. Coronary artery disease of native artery of native heart with stable angina pectoris ([employer]) Continue cardiac and antihypertensive medications as already ordered and reviewed, no changes at this time.  Continue statin as ordered and reviewed, no changes at this time.  Based on angiogram today she needs bilateral femoral endarterectomies with stenting of the SFA.  Therefore she needs cardiac evaluation.  Nitrates PRN for chest pain   3. Hypertension, benign Continue antihypertensive medications as already  ordered, these medications have been reviewed and there are no changes at this time.   4. Pulmonary emphysema, unspecified emphysema type ([place]) Continue pulmonary medications and aerosols as already ordered, these medications have been reviewed and there are no changes at this time.    5. Type 2 diabetes mellitus without complication, with long-term current use of insulin ([facility]) Continue hypoglycemic medications as already ordered, these medications have been reviewed and there are no changes at this time.  Hgb A1C to be monitored as already arranged by primary service     [name], MD  04/02/2022 2:53 PM

## 2022-04-03 ENCOUNTER — Ambulatory Visit (INDEPENDENT_AMBULATORY_CARE_PROVIDER_SITE_OTHER): Payer: Medicare HMO | Admitting: Vascular Surgery

## 2022-04-03 ENCOUNTER — Encounter (INDEPENDENT_AMBULATORY_CARE_PROVIDER_SITE_OTHER): Payer: Self-pay | Admitting: Vascular Surgery

## 2022-04-03 VITALS — BP 148/83 | HR 77 | Resp 16 | Wt 122.0 lb

## 2022-04-03 DIAGNOSIS — I25118 Atherosclerotic heart disease of native coronary artery with other forms of angina pectoris: Secondary | ICD-10-CM | POA: Diagnosis not present

## 2022-04-03 DIAGNOSIS — J439 Emphysema, unspecified: Secondary | ICD-10-CM

## 2022-04-03 DIAGNOSIS — E119 Type 2 diabetes mellitus without complications: Secondary | ICD-10-CM

## 2022-04-03 DIAGNOSIS — Z794 Long term (current) use of insulin: Secondary | ICD-10-CM

## 2022-04-03 DIAGNOSIS — I10 Essential (primary) hypertension: Secondary | ICD-10-CM

## 2022-04-03 DIAGNOSIS — I70223 Atherosclerosis of native arteries of extremities with rest pain, bilateral legs: Secondary | ICD-10-CM | POA: Diagnosis not present

## 2022-04-04 ENCOUNTER — Encounter (INDEPENDENT_AMBULATORY_CARE_PROVIDER_SITE_OTHER): Payer: Self-pay | Admitting: Vascular Surgery

## 2022-04-04 ENCOUNTER — Telehealth: Payer: Self-pay | Admitting: Cardiology

## 2022-04-04 NOTE — Telephone Encounter (Signed)
-----   Message from Kate Sable, MD sent at 04/04/2022 12:20 PM EDT ----- Good morning,  Dr. Delana Meyer (vascular surgery) just reached out to me regarding this patient.   bilateral femoral bypass is being planned, patient referred to [place] for cardiac eval prior to surgery scheduled for October.  He is wondering if patient can be moved to an earlier appointment (with any provider) as surgery is planned ASAP.    Thanks BA

## 2022-04-04 NOTE — Telephone Encounter (Signed)
LMOV to schedule sooner with any provider

## 2022-04-05 ENCOUNTER — Encounter: Payer: Self-pay | Admitting: Emergency Medicine

## 2022-04-05 ENCOUNTER — Other Ambulatory Visit: Payer: Self-pay

## 2022-04-05 ENCOUNTER — Emergency Department
Admission: EM | Admit: 2022-04-05 | Discharge: 2022-04-05 | Disposition: A | Discharge status: Home / Self Care | Payer: Medicare HMO | Attending: Emergency Medicine | Admitting: Emergency Medicine

## 2022-04-05 DIAGNOSIS — R42 Dizziness and giddiness: Secondary | ICD-10-CM | POA: Diagnosis not present

## 2022-04-05 DIAGNOSIS — N764 Abscess of vulva: Secondary | ICD-10-CM | POA: Diagnosis not present

## 2022-04-05 DIAGNOSIS — N760 Acute vaginitis: Secondary | ICD-10-CM

## 2022-04-05 DIAGNOSIS — N898 Other specified noninflammatory disorders of vagina: Secondary | ICD-10-CM | POA: Diagnosis present

## 2022-04-05 MED ORDER — DOXYCYCLINE HYCLATE 50 MG PO CAPS: 100.0000 mg | ORAL_CAPSULE | Freq: Two times a day (BID) | ORAL | 0 refills | Status: AC

## 2022-04-05 MED ORDER — MECLIZINE HCL 25 MG PO TABS
25.0000 mg | ORAL_TABLET | Freq: Once | ORAL | Status: AC
  Administered 2022-04-05: 25 mg via ORAL
  Filled 2022-04-05: qty 1

## 2022-04-05 MED ORDER — MECLIZINE HCL 25 MG PO TABS: 25.0000 mg | ORAL_TABLET | Freq: Three times a day (TID) | ORAL | 0 refills | Status: DC | PRN

## 2022-04-05 MED ORDER — LIDOCAINE 5 % EX OINT: 1.0000 | TOPICAL_OINTMENT | CUTANEOUS | 0 refills | Status: DC | PRN

## 2022-04-05 NOTE — ED Provider Notes (Signed)
[facility] Provider Note   Event Date/Time   First MD Initiated Contact with Patient 04/05/22 1239     (approximate) History  Abscess  HPI Heather [name] is a 71 y.o. female who presents complaining of vaginal pain as well as a discharge from the right labial region where she states she has an abscess.  Patient states that over the last week she has had worsening pain overlying the left labia of her vagina that has had some erythema, induration, and recently opened and draining purulent material.  Patient does state that this pain is worsened with any walking and partially relieved at rest. ROS: Patient currently denies any vision changes, tinnitus, difficulty speaking, facial droop, sore throat, chest pain, shortness of breath, abdominal pain, nausea/vomiting/diarrhea, dysuria, or weakness/numbness/paresthesias in any extremity   Physical Exam  Triage Vital Signs: ED Triage Vitals  Enc Vitals Group     BP 04/05/22 1226 136/88     Pulse Rate 04/05/22 1226 (!) 109     Resp 04/05/22 1226 17     Temp 04/05/22 1226 98.6 F (37 C)     Temp src --      SpO2 04/05/22 1226 93 %     Weight 04/05/22 1208 122 lb (55.3 kg)     Height 04/05/22 1208 5' (1.524 m)     Head Circumference --      Peak Flow --      Pain Score 04/05/22 1208 9     Pain Loc --      Pain Edu? --      Excl. in [place]? --    Most recent vital signs: Vitals:   04/05/22 1226 04/05/22 1512  BP: 136/88 (!) 120/95  Pulse: (!) 109 92  Resp: 17 17  Temp: 98.6 F (37 C) 98.4 F (36.9 C)  SpO2: 93% 98%   General: Awake, oriented x4. CV:  Good peripheral perfusion.  Resp:  Normal effort.  Abd:  No distention.  Other:  Elderly overweight Caucasian female laying in bed in no acute distress.  There is erythema and a punctate area of purulent drainage to the lateral aspect of the right labia majora. ED Results / Procedures / Treatments   PROCEDURES: Critical Care performed: No Procedures MEDICATIONS  ORDERED IN ED: Medications  meclizine (ANTIVERT) tablet 25 mg (25 mg Oral Given 04/05/22 1503)   IMPRESSION / MDM / ASSESSMENT AND PLAN / ED COURSE  I reviewed the triage vital signs and the nursing notes.                             Patient's presentation is most consistent with acute presentation with potential threat to life or bodily function. Presentation most consistent with simple Abscess. Given History, Exam, and Workup I have low suspicion for Cellulitis, Necrotizing Fasciitis, Pyomyositis, Sporotrichosis, Osteomyelitis or other emergent problem as a cause for this presentation. Interventions: none necessary as wound is already open and draining Rx: Doxycycline 100 mg BID x 5 days, lidocaine ointment, meclizine Patient incidentally found on reassessment to have vertiginous symptoms found on hints exam suggest peripheral vertigo. Disposition: Patient is stable for discharge, advised to follow up with primary care physician in 48 hours.   FINAL CLINICAL IMPRESSION(S) / ED DIAGNOSES   Final diagnoses:  Abscess of vagina  Vertigo   Rx / DC Orders   ED Discharge Orders          Ordered  doxycycline (VIBRAMYCIN) 50 MG capsule  2 times daily        04/05/22 1459    meclizine (ANTIVERT) 25 MG tablet  3 times daily PRN        04/05/22 1459    lidocaine (XYLOCAINE) 5 % ointment  As needed        04/05/22 1459           Note:  This document was prepared using Dragon voice recognition software and may include unintentional dictation errors.   Naaman Plummer, MD 04/05/22 Drema [place]on

## 2022-04-05 NOTE — ED Triage Notes (Signed)
Pt reports abscess on the right side of her vagina for the last week. Pt reports it is draining now and hurts really bad.

## 2022-04-08 ENCOUNTER — Telehealth (INDEPENDENT_AMBULATORY_CARE_PROVIDER_SITE_OTHER): Payer: Self-pay

## 2022-04-08 NOTE — Telephone Encounter (Signed)
LMOV  

## 2022-04-08 NOTE — Telephone Encounter (Signed)
Pt LVM stating that the cardiologist could not see her until Oct 3rd for clearance for surgery.  I let her know that she was referred to the dr that Dr Delana Meyer wanted her to see and advised her to call them and ask to be added to the call list if someone cancels.  Pt states verbal understanding.

## 2022-04-09 ENCOUNTER — Ambulatory Visit: Payer: Medicare HMO | Admitting: Cardiovascular Disease

## 2022-05-27 ENCOUNTER — Ambulatory Visit: Payer: Medicare HMO | Admitting: Cardiovascular Disease

## 2022-06-08 NOTE — Progress Notes (Unsigned)
Cardiology Office Note  Date:  06/09/2022   ID:  Heather [name], DOB 03-Dec-1950, MRN [id number]  PCP:  [name], FNP   Chief Complaint  Patient presents with   New Patient (Initial Visit)    Ref by Dr. Ronalee Belts for pre-op evaluation for bilateral femoral endarterectomies with stenting of the SFA.  Patient c/o shortness of breath with mostly walking a short distance. Medications reviewed by the patient verbally.     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  CAD, PCI to mid LAD May 2017. 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 Ronalee Belts for preop evaluation  Based on angiogram , needs bilateral femoral endarterectomies with stenting of the SFA  No recent echo Has claudication symptoms, worse with walking and when sleeping  Rare chest pain while sitting, once every 3-4 months  Poor diabetes control by history Prior A1C 10.7 in 10/21  Continues to smoke 1.5 packs/day Tried nicotine patches, did not work Has not tried other modalities  EKG personally reviewed by myself on todays visit NSR rate 63 no significant ST or t wave changes, sinus arrhythmia  PMH:   has a past medical history of Anxiety, Bipolar disorder ([place]), Cancer ([place]), Depression, Diabetes mellitus, Diabetes mellitus, type II ([place]), History of artificial heart valve (2018), Schizophrenia ([place]), and Vertigo.  PSH:    Past Surgical History:  Procedure Laterality Date   ABDOMINAL HYSTERECTOMY     BACK SURGERY     CARDIAC VALVE REPLACEMENT     CHOLECYSTECTOMY     HEMORRHOID SURGERY     LOWER EXTREMITY ANGIOGRAPHY Right 04/12/2021   Procedure: Lower Extremity Angiography;  Surgeon: [name], MD;  Location: [facility];  Service: Cardiovascular;  Laterality: Right;   LOWER EXTREMITY ANGIOGRAPHY Left 05/28/2021   Procedure: LOWER EXTREMITY ANGIOGRAPHY;  Surgeon: [name], MD;  Location: [facility];  Service: Cardiovascular;  Laterality: Left;   LOWER EXTREMITY ANGIOGRAPHY Left 03/25/2022   Procedure: Lower Extremity Angiography;  Surgeon: [name], MD;  Location: [facility];  Service: Cardiovascular;  Laterality: Left;   TUBAL LIGATION     VISCERAL ANGIOGRAPHY N/A 11/13/2020   Procedure: VISCERAL ANGIOGRAPHY;  Surgeon: [name], MD;  Location: [facility];  Service: Cardiovascular;  Laterality: N/A;    Current Outpatient Medications  Medication Sig Dispense Refill   albuterol (VENTOLIN HFA) 108 (90 Base) MCG/ACT inhaler Inhale 2 puffs into the lungs every 4 (four) hours as needed for wheezing or shortness of breath.     aspirin EC 81 MG tablet Take 81 mg by mouth daily.     atorvastatin (LIPITOR) 80 MG tablet Take 80 mg by mouth daily.     cholecalciferol (VITAMIN D) 1000 units tablet Take 1,000 Units by mouth daily.     clopidogrel (PLAVIX) 75 MG tablet Take 1 tablet (75 mg total) by mouth daily. 30 tablet 6   escitalopram (LEXAPRO) 20 MG tablet Take 1 tablet (20 mg total) by mouth daily. 90 tablet 0   fluticasone (FLONASE) 50 MCG/ACT nasal spray Place 1 spray into both nostrils daily as needed for allergies.     Fluticasone-Salmeterol (  ADVAIR) 250-50 MCG/DOSE AEPB Inhale 1 puff into the lungs daily.     furosemide (LASIX) 20 MG tablet Take 20 mg by mouth daily.     LEVEMIR FLEXTOUCH 100 UNIT/ML FlexPen Inject 18 Units into the skin at bedtime.     lidocaine (XYLOCAINE) 5 % ointment Apply 1 Application topically as needed. 35.44 g 0   losartan (COZAAR) 50 MG tablet Take 50 mg by mouth daily.     meclizine (ANTIVERT) 25 MG tablet Take 1 tablet (25 mg total) by mouth 3 (three) times daily as needed for dizziness. 30 tablet 0   metFORMIN  (GLUCOPHAGE-XR) 750 MG 24 hr tablet Take 750 mg by mouth daily.     metoprolol succinate (TOPROL-XL) 25 MG 24 hr tablet Take 25 mg by mouth daily.     Multiple Vitamins-Minerals (WOMENS MULTI VITAMIN & MINERAL PO) Take 1 tablet by mouth every morning.     NOVOLOG FLEXPEN 100 UNIT/ML FlexPen Inject 5 Units into the skin.     pregabalin (LYRICA) 50 MG capsule Take 50 mg by mouth 3 (three) times daily.     traMADol (ULTRAM) 50 MG tablet Take 1 tablet (50 mg total) by mouth every 6 (six) hours as needed. 15 tablet 0   traZODone (DESYREL) 50 MG tablet TAKE 1/2 TO 1 TABLET AT BEDTIME AS NEEDED FOR SLEEP (Patient taking differently: Take 25 mg by mouth at bedtime as needed for sleep.) 90 tablet 1   ACCU-CHEK AVIVA PLUS test strip  (Patient not taking: Reported on 06/09/2022)     Accu-Chek Softclix Lancets lancets  (Patient not taking: Reported on 06/09/2022)     Alcohol Swabs (B-D SINGLE USE SWABS REGULAR) PADS  (Patient not taking: Reported on 06/09/2022)     Blood Glucose Calibration (ACCU-CHEK AVIVA) SOLN  (Patient not taking: Reported on 06/09/2022)     Blood Glucose Monitoring Suppl (ACCU-CHEK AVIVA PLUS) w/Device KIT  (Patient not taking: Reported on 06/09/2022)     donepezil (ARICEPT) 5 MG tablet Take 5 mg by mouth at bedtime. (Patient not taking: Reported on 03/25/2022)     Insulin Syringe-Needle U-100 30G X 5/16" 1 ML MISC  (Patient not taking: Reported on 06/09/2022)     JARDIANCE 10 MG TABS tablet Take 10 mg by mouth daily. (Patient not taking: Reported on 03/19/2022)     metFORMIN (GLUCOPHAGE) 500 MG tablet Take 500 mg by mouth daily. (Patient not taking: Reported on 06/09/2022)     oxyCODONE-acetaminophen (PERCOCET/ROXICET) 5-325 MG tablet Take 1 tablet by mouth every 6 (six) hours as needed for severe pain. (Patient not taking: Reported on 03/19/2022) 20 tablet 0   No current facility-administered medications for this visit.     Allergies:   Iodinated contrast media, Sulfa antibiotics, and  Shellfish allergy   Social History:  The patient  reports that she has been smoking cigarettes. She has a 72.00 pack-year smoking history. She has never used smokeless tobacco. She reports that she does not currently use alcohol. She reports that she does not currently use drugs.   Family History:   family history includes Mental illness in her mother.    Review of Systems: Review of Systems  Constitutional: Negative.   HENT: Negative.    Respiratory: Negative.    Cardiovascular: Negative.   Gastrointestinal: Negative.   Musculoskeletal: Negative.   Neurological: Negative.   Psychiatric/Behavioral: Negative.    All other systems reviewed and are negative.   PHYSICAL EXAM: VS:  BP 112/68 (BP Location: Right Arm, Patient  Position: Sitting, Cuff Size: Normal)   Pulse 63   Ht 5' (1.524 m)   Wt 127 lb (57.6 kg)   SpO2 96%   BMI 24.80 kg/m  , BMI Body mass index is 24.8 kg/m. GEN: Well nourished, well developed, in no acute distress HEENT: normal Neck: no JVD, carotid bruits, or masses Cardiac: RRR; no murmurs, rubs, or gallops,no edema  Respiratory:  clear to auscultation bilaterally, normal work of breathing GI: soft, nontender, nondistended, + BS Heather: no deformity or atrophy Skin: warm and dry, no rash Neuro:  Strength and sensation are intact Psych: euthymic mood, full affect   Recent Labs: 03/25/2022: BUN 29; Creatinine, Ser 1.08    Lipid Panel No results found for: "CHOL", "HDL", "LDLCALC", "TRIG"    Wt Readings from Last 3 Encounters:  06/09/22 127 lb (57.6 kg)  04/05/22 122 lb (55.3 kg)  04/03/22 122 lb (55.3 kg)     ASSESSMENT AND PLAN:  Problem List Items Addressed This Visit       Cardiology Problems   (HFpEF) heart failure with preserved ejection fraction (HCC)   Relevant Orders   EKG 12-Lead   CAD (coronary artery disease) - Primary   Relevant Orders   EKG 12-Lead   Nonrheumatic aortic valve stenosis     Other   Ischemic leg pain   Relevant  Orders   EKG 12-Lead   S/P TAVR (transcatheter aortic valve replacement)   Relevant Orders   EKG 12-Lead   Other Visit Diagnoses     Atherosclerosis of artery of extremity with rest pain ([place])       Relevant Orders   EKG 12-Lead   Smoker          Preop cardiovascular evaluation for femoral endarterectomies Stress and echo ordered as below Rare episodes of chest pain concerning for angina  Coronary artery disease with stable angina No recent stress testing, having episodes of chest pain though rare We have called in a prescription for sublingual nitro Given need for vascular surgery, preop evaluation indicated We will order a pharmacologic Myoview Baseline echocardiogram ordered  Aortic valve disorder, s/p TAVR No significant murmur on exam, no clinical signs of heart failure Not active at baseline Echocardiogram ordered for baseline, no recent study  Smoker We have encouraged her to continue to work on weaning her cigarettes and smoking cessation. She will continue to work on this and does not want any assistance with chantix.    PAD Significant lower extremity arterial disease, prior stenting Need for further surgical intervention with vascular surgery pending Testing as above  Hyperlipidemia Continue Lipitor 80 Lab work through primary care, goal LDL  less than 60   Total encounter time more than 60 minutes  Greater than 50% was spent in counseling and coordination of care with the patient    Signed, Esmond Plants, M.D., Ph.D. [place], [place]

## 2022-06-09 ENCOUNTER — Encounter: Payer: Self-pay | Admitting: Cardiovascular Disease

## 2022-06-09 ENCOUNTER — Ambulatory Visit: Payer: Medicare HMO | Admitting: Cardiovascular Disease

## 2022-06-09 ENCOUNTER — Ambulatory Visit: Payer: Medicare HMO | Attending: Cardiovascular Disease | Admitting: Cardiovascular Disease

## 2022-06-09 VITALS — BP 112/68 | HR 63 | Ht 60.0 in | Wt 127.0 lb

## 2022-06-09 DIAGNOSIS — I998 Other disorder of circulatory system: Secondary | ICD-10-CM

## 2022-06-09 DIAGNOSIS — I25118 Atherosclerotic heart disease of native coronary artery with other forms of angina pectoris: Secondary | ICD-10-CM | POA: Diagnosis not present

## 2022-06-09 DIAGNOSIS — F17200 Nicotine dependence, unspecified, uncomplicated: Secondary | ICD-10-CM

## 2022-06-09 DIAGNOSIS — I5030 Unspecified diastolic (congestive) heart failure: Secondary | ICD-10-CM

## 2022-06-09 DIAGNOSIS — Z952 Presence of prosthetic heart valve: Secondary | ICD-10-CM

## 2022-06-09 DIAGNOSIS — I70229 Atherosclerosis of native arteries of extremities with rest pain, unspecified extremity: Secondary | ICD-10-CM

## 2022-06-09 DIAGNOSIS — R0789 Other chest pain: Secondary | ICD-10-CM

## 2022-06-09 DIAGNOSIS — M79606 Pain in leg, unspecified: Secondary | ICD-10-CM | POA: Diagnosis not present

## 2022-06-09 DIAGNOSIS — I350 Nonrheumatic aortic (valve) stenosis: Secondary | ICD-10-CM

## 2022-06-09 DIAGNOSIS — R072 Precordial pain: Secondary | ICD-10-CM

## 2022-06-09 MED ORDER — NITROGLYCERIN 0.4 MG SL SUBL: 0.4000 mg | SUBLINGUAL_TABLET | SUBLINGUAL | 3 refills | Status: DC | PRN

## 2022-06-09 NOTE — Patient Instructions (Addendum)
Medication Instructions:  Nitro SL for anginal pain as needed  If you need a refill on your cardiac medications before your next appointment, please call your pharmacy.   Lab work: No new labs needed  Testing/Procedures: Echocardiogram for AVR, TAVR Your physician has requested that you have an echocardiogram. Echocardiography is a painless test that uses sound waves to create images of your heart. It provides your doctor with information about the size and shape of your heart and how well your heart's chambers and valves are working. This procedure takes approximately one hour. There are no restrictions for this procedure. Please do NOT wear cologne, perfume, aftershave, or lotions (deodorant is allowed). Please arrive 15 minutes prior to your appointment time.  Myoview on Thursday for CAD, prior PCI/stent, angina [place]  Your caregiver has ordered a Stress Test with nuclear imaging. The purpose of this test is to evaluate the blood supply to your heart muscle. This procedure is referred to as a "Non-Invasive Stress Test." This is because other than having an IV started in your vein, nothing is inserted or "invades" your body. Cardiac stress tests are done to find areas of poor blood flow to the heart by determining the extent of coronary artery disease (CAD). Some patients exercise on a treadmill, which naturally increases the blood flow to your heart, while others who are  unable to walk on a treadmill due to physical limitations have a pharmacologic/chemical stress agent called Lexiscan . This medicine will mimic walking on a treadmill by temporarily increasing your coronary blood flow.   Please note: these test may take anywhere between 2-4 hours to complete  PLEASE REPORT TO [facility] AT THE FIRST DESK WILL DIRECT YOU WHERE TO GO  Date of Procedure:_____________________________________  Arrival Time for  Procedure:______________________________  Instructions regarding medication:   __XX__ : Hold diabetes medication (metformin) the morning of procedure. The night before take ONLY 9 units of Levemir.                _XX___:  HOLD Furosemide the morning of your procedure.    PLEASE NOTIFY THE OFFICE AT LEAST 67 HOURS IN ADVANCE IF YOU ARE UNABLE TO KEEP YOUR APPOINTMENT.  [phone] AND  PLEASE NOTIFY NUCLEAR MEDICINE AT [facility] AT LEAST 24 HOURS IN ADVANCE IF YOU ARE UNABLE TO KEEP YOUR APPOINTMENT. [phone]  How to prepare for your Myoview test:  Do not eat or drink after midnight No caffeine for 24 hours prior to test No smoking 24 hours prior to test. Your medication may be taken with water.  If your doctor stopped a medication because of this test, do not take that medication. Ladies, please do not wear dresses.  Skirts or pants are appropriate. Please wear a short sleeve shirt. No perfume, cologne or lotion. Wear comfortable walking shoes. No heels!   Follow-Up: At [facility], you and your health needs are our priority.  As part of our continuing mission to provide you with exceptional heart care, we have created designated Provider Care Teams.  These Care Teams include your primary Cardiologist (physician) and Advanced Practice Providers (APPs -  Physician Assistants and Nurse Practitioners) who all work together to provide you with the care you need, when you need it.  You will need a follow up appointment in 12 months  Providers on your designated Care Team:   Murray Hodgkins, NP Christell Faith, PA-C Cad[place] Kathlen Mody, [place]  COVID-19 Vaccine Information can be found at: [email or web address]  For questions related to vaccine distribution or appointments, please email [email or web address] or call [phone].

## 2022-06-12 ENCOUNTER — Encounter
Admission: RE | Admit: 2022-06-12 | Discharge: 2022-06-12 | Disposition: A | Discharge status: Home / Self Care | Payer: Medicare HMO | Source: Ambulatory Visit | Attending: Cardiovascular Disease | Admitting: Cardiovascular Disease

## 2022-06-12 DIAGNOSIS — R072 Precordial pain: Secondary | ICD-10-CM | POA: Insufficient documentation

## 2022-06-12 LAB — NM MYOCAR MULTI W/SPECT W/WALL MOTION / EF
Base ST Depression (mm): 0 mm
LV dias vol: 26 mL (ref 46–106)
LV sys vol: 3 mL
Nuc Stress EF: 88 %
Peak HR: 97 {beats}/min
Percent HR: 64 %
Rest HR: 61 {beats}/min
Rest Nuclear Isotope Dose: 10.1 mCi
SDS: 0
SRS: 7
SSS: 0
ST Depression (mm): 0 mm
Stress Nuclear Isotope Dose: 29.1 mCi
TID: 1

## 2022-06-12 MED ORDER — TECHNETIUM TC 99M TETROFOSMIN IV KIT
29.1000 | PACK | Freq: Once | INTRAVENOUS | Status: AC | PRN
  Administered 2022-06-12: 29.1 via INTRAVENOUS

## 2022-06-12 MED ORDER — TECHNETIUM TC 99M TETROFOSMIN IV KIT
10.1100 | PACK | Freq: Once | INTRAVENOUS | Status: AC | PRN
  Administered 2022-06-12: 10.11 via INTRAVENOUS

## 2022-06-12 MED ORDER — REGADENOSON 0.4 MG/5ML IV SOLN
0.4000 mg | Freq: Once | INTRAVENOUS | Status: AC
Start: 2022-06-12 — End: 2022-06-12
  Administered 2022-06-12: 0.4 mg via INTRAVENOUS

## 2022-06-20 ENCOUNTER — Telehealth: Payer: Self-pay | Admitting: Cardiovascular Disease

## 2022-06-20 ENCOUNTER — Telehealth (INDEPENDENT_AMBULATORY_CARE_PROVIDER_SITE_OTHER): Payer: Self-pay

## 2022-06-20 NOTE — Telephone Encounter (Signed)
Returned call to patient who states she has been experiencing hives for about 5 days since receiving Contrast.  Pt denies any difficulty breathing, itchy throat, etc.  States she has tried Benadryl which works for a short time and the hives return.  Pt aware message is being forwarded to Provider and she will receive return call. Georgana Curio MHA RN CCM

## 2022-06-20 NOTE — Telephone Encounter (Signed)
Patient is calling stating she has broken out with bad hives due to the dye used for the testing she had on 10/19. Please advise.

## 2022-06-20 NOTE — Telephone Encounter (Signed)
Verbal orders from Dr. Rockey Situ is for her to take Benadryl and Famotidine twice a day and that if that does not help then to go to Urgent care for possible steroids if needed. He also advised that if she should have any breathing issues to also go to Urgent care.   Spoke with patient and inquired when she started noticing this rash. She reports it occurred about 2 days after her scan. We then reviewed provider recommendations. Reviewed strict ED precautions. She verbalized understanding with no further questions at this time.

## 2022-06-20 NOTE — Telephone Encounter (Signed)
Spoke with the patient and she is scheduled with Dr. Delana Meyer on 07/09/22 for bilateral femoral endarterectomies and bilateral SFA stent placements at the [facility]. Pre-op phone call is on 07/01/22 between 1-5 pm. Pre-surgical instructions were discussed and will be mailed. Patient stated she got hives from the dye at her echo at [facility]. I advised that if she still has them when she speak with pre-op to please inform them of that and we may have to post pone her surgery.

## 2022-06-20 NOTE — Telephone Encounter (Signed)
Pt is returning call. Transferred to Georgana Curio, Therapist, sports.

## 2022-06-23 NOTE — Telephone Encounter (Signed)
Spoke with patient to see how her rash was doing. She states that it is still there. Reviewed providers further comments and recommendations. She was agreeable with this plan and had no further questions at this time.    Minna Merritts, MD  Sent: Fri June 20, 2022  4:38 PM  To: Valora Corporal, RN    Message  She had a Myoview, lexiscan  this is not typical contrast allergy as seen with CT scan  For now would continue Benadryl, try Pepcid twice daily  Trying to avoid prednisone if we can  Thx  TG

## 2022-07-01 ENCOUNTER — Encounter
Admission: RE | Admit: 2022-07-01 | Discharge: 2022-07-01 | Disposition: A | Discharge status: Home / Self Care | Payer: Medicare HMO | Source: Ambulatory Visit | Attending: Vascular Surgery | Admitting: Vascular Surgery

## 2022-07-01 ENCOUNTER — Other Ambulatory Visit (INDEPENDENT_AMBULATORY_CARE_PROVIDER_SITE_OTHER): Payer: Self-pay | Admitting: Nurse Practitioner

## 2022-07-01 VITALS — Ht 60.0 in | Wt 120.0 lb

## 2022-07-01 DIAGNOSIS — I10 Essential (primary) hypertension: Secondary | ICD-10-CM

## 2022-07-01 DIAGNOSIS — I70223 Atherosclerosis of native arteries of extremities with rest pain, bilateral legs: Secondary | ICD-10-CM

## 2022-07-01 DIAGNOSIS — Z01812 Encounter for preprocedural laboratory examination: Secondary | ICD-10-CM

## 2022-07-01 HISTORY — DX: Other cervical disc degeneration, unspecified cervical region: M50.30

## 2022-07-01 HISTORY — DX: Chronic obstructive pulmonary disease, unspecified: J44.9

## 2022-07-01 HISTORY — DX: Hyperlipidemia, unspecified: E78.5

## 2022-07-01 HISTORY — DX: Essential (primary) hypertension: I10

## 2022-07-01 HISTORY — DX: Peripheral vascular disease, unspecified: I73.9

## 2022-07-01 NOTE — Pre-Procedure Instructions (Signed)
Copy and pasted from epic CV Proc result notes:   Minna Merritts, MD 06/12/2022  6:40 PM EDT     Stress test Low risk study Normal perfusion, no significant ischemia, normal ejection fraction   Acceptable risk for bilateral femoral endarterectomies with Dr. Ronalee Belts No further cardiac testing needed

## 2022-07-01 NOTE — Patient Instructions (Addendum)
Your procedure is scheduled on: Wednesday, November 15 Report to the Registration Desk on the 1st floor of the [facility]. To find out your arrival time, please call [phone] between 1PM - 3PM on: Tuesday, November 14 If your arrival time is 6:00 am, do not arrive prior to that time as the [facility] entrance doors do not open until 6:00 am.  REMEMBER: Instructions that are not followed completely may result in serious medical risk, up to and including death; or upon the discretion of your surgeon and anesthesiologist your surgery may need to be rescheduled.  Do not eat or drink after midnight the night before surgery.  No gum chewing, lozengers or hard candies.  TAKE THESE MEDICATIONS THE MORNING OF SURGERY WITH A SIP OF WATER:  Escitalopram (lexapro) Advair inhaler Metoprolol Pregabalin (lyrica) Tramadol if needed for pain Pepcid - take one the night before surgery and one the morning of surgery; helps to prevent nausea after surgery.  Use inhalers on the day of surgery and bring your albuterol inhaler to the hospital.  Metformin - hold 2 days prior to surgery. Last day to take Metformin is Sunday, November 12. Resume AFTER surgery.  Levemir insulin - only take half of your dose the night before surgery. Only take 9 units of Levemir on Tuesday, November 14.  Do not take any insulin on the morning of surgery.  Plavix (clopidogrel) - hold 5 days prior to surgery. Last day to take Plavix is Thursday, November 9. Resume AFTER surgery per surgeon instructions.  Continue taking the aspirin and all other prescribed medications up until the day of surgery. Only take the above listed medications on the morning of surgery.  One week prior to surgery: starting November 8 Stop Anti-inflammatories (NSAIDS) such as Advil, Aleve, Ibuprofen, Motrin, Naproxen, Naprosyn and Aspirin based products such as Excedrin, Goodys Powder, BC Powder. Stop ANY OVER THE COUNTER supplements until after  surgery. Stop multiple vitamin. You may however, continue to take Tylenol if needed for pain up until the day of surgery.  No Alcohol for 24 hours before or after surgery.  No Smoking including e-cigarettes for 24 hours prior to surgery.  No chewable tobacco products for at least 6 hours prior to surgery.  No nicotine patches on the day of surgery.  Do not use any "recreational" drugs for at least a week prior to your surgery.  Please be advised that the combination of cocaine and anesthesia may have negative outcomes, up to and including death. If you test positive for cocaine, your surgery will be cancelled.  On the morning of surgery brush your teeth with toothpaste and water, you may rinse your mouth with mouthwash if you wish. Do not swallow any toothpaste or mouthwash.  Do not wear jewelry, make-up, hairpins, clips or nail polish.  Do not wear lotions, powders, or perfumes.   Do not shave body from the neck down 48 hours prior to surgery just in case you cut yourself which could leave a site for infection.   Contact lenses, hearing aids and dentures may not be worn into surgery.  Do not bring valuables to the hospital. [facility] is not responsible for any missing/lost belongings or valuables.   Notify your doctor if there is any change in your medical condition (cold, fever, infection).  Wear comfortable clothing (specific to your surgery type) to the hospital.  After surgery, you can help prevent lung complications by doing breathing exercises.  Take deep breaths and cough every  1-2 hours. Your doctor may order a device called an Incentive Spirometer to help you take deep breaths.  If you are being admitted to the hospital overnight, leave your suitcase in the car. After surgery it may be brought to your room.  If you are taking public transportation, you will need to have a responsible adult (18 years or older) with you. Please confirm with your physician that it is  acceptable to use public transportation.   Please call the [facility] Dept. at [phone] if you have any questions about these instructions.  Surgery Visitation Policy:  Patients undergoing a surgery or procedure may have two family members or support persons with them as long as the person is not COVID-19 positive or experiencing its symptoms.   Inpatient Visitation:    Visiting hours are 7 a.m. to 8 p.m. Up to four visitors are allowed at one time in a patient room, including children. The visitors may rotate out with other people during the day. One designated support person (adult) may remain overnight.

## 2022-07-01 NOTE — Pre-Procedure Instructions (Signed)
Pre-op interview completed. Patient stated that she continues to have a rash on her arms and face. Believes that the rash was from the myocardial perfusion scan done on 06/12/2022. Continues to take benadryl and pepcid as instructed by Dr. Rockey Situ. The pre-op CHG wash will not be given for this reason. Rash can be evaluated when she comes for pre-op labs tomorrow.

## 2022-07-02 ENCOUNTER — Encounter
Admission: RE | Admit: 2022-07-02 | Discharge: 2022-07-02 | Disposition: A | Discharge status: Home / Self Care | Payer: Medicare HMO | Source: Ambulatory Visit | Attending: Vascular Surgery | Admitting: Vascular Surgery

## 2022-07-02 DIAGNOSIS — E119 Type 2 diabetes mellitus without complications: Secondary | ICD-10-CM | POA: Diagnosis not present

## 2022-07-02 DIAGNOSIS — I70223 Atherosclerosis of native arteries of extremities with rest pain, bilateral legs: Secondary | ICD-10-CM | POA: Diagnosis not present

## 2022-07-02 DIAGNOSIS — I10 Essential (primary) hypertension: Secondary | ICD-10-CM | POA: Diagnosis not present

## 2022-07-02 DIAGNOSIS — Z01812 Encounter for preprocedural laboratory examination: Secondary | ICD-10-CM | POA: Diagnosis present

## 2022-07-02 LAB — TYPE AND SCREEN
ABO/RH(D): O POS
Antibody Screen: NEGATIVE

## 2022-07-02 LAB — CBC
HCT: 46.7 % — ABNORMAL HIGH (ref 36.0–46.0)
Hemoglobin: 15.3 g/dL — ABNORMAL HIGH (ref 12.0–15.0)
MCH: 28.9 pg (ref 26.0–34.0)
MCHC: 32.8 g/dL (ref 30.0–36.0)
MCV: 88.1 fL (ref 80.0–100.0)
Platelets: 346 10*3/uL (ref 150–400)
RBC: 5.3 MIL/uL — ABNORMAL HIGH (ref 3.87–5.11)
RDW: 14.6 % (ref 11.5–15.5)
WBC: 8.2 10*3/uL (ref 4.0–10.5)
nRBC: 0 % (ref 0.0–0.2)

## 2022-07-02 LAB — BASIC METABOLIC PANEL
Anion gap: 11 (ref 5–15)
BUN: 26 mg/dL — ABNORMAL HIGH (ref 8–23)
CO2: 25 mmol/L (ref 22–32)
Calcium: 9.4 mg/dL (ref 8.9–10.3)
Chloride: 100 mmol/L (ref 98–111)
Creatinine, Ser: 1.04 mg/dL — ABNORMAL HIGH (ref 0.44–1.00)
GFR, Estimated: 58 mL/min — ABNORMAL LOW (ref >60)
Glucose, Bld: 319 mg/dL — ABNORMAL HIGH (ref 70–99)
Potassium: 4.2 mmol/L (ref 3.5–5.1)
Sodium: 136 mmol/L (ref 135–145)

## 2022-07-02 LAB — SURGICAL PCR SCREEN
MRSA, PCR: NEGATIVE
Staphylococcus aureus: NEGATIVE

## 2022-07-02 LAB — HEMOGLOBIN A1C
Hgb A1c MFr Bld: 12 % — ABNORMAL HIGH (ref 4.8–5.6)
Mean Plasma Glucose: 297.7 mg/dL

## 2022-07-02 NOTE — Progress Notes (Signed)
Arrived to preadmission testing for labs. Scattered rash areas on arms and chest. Also stated that she has a vaginal infection again. Call to PCP [facility]) to inform of symptoms and request for patient to be seen prior to upcoming surgery on November 15. Appointment made for Monday, November 13. Eulogio Ditch, NP of Dr. Delana Meyer made aware of above.

## 2022-07-03 NOTE — Progress Notes (Signed)
  Perioperative Services: Pre-Admission/Anesthesia Testing  Abnormal Lab Notification    Date: 07/03/22  Name: Heather [name] MRN:   [id number]  Re: Abnormal labs noted during PAT appointment   Provider(s) Notified: [name], MD    Eulogio Ditch, NP-C [place], Raquel Sarna, FNP-C   Notification mode: Routed and/or faxed via [facility]   ABNORMAL LAB VALUE(S): Lab Results  Component Value Date   GLUCOSE 319 (H) 07/02/2022    Notes: Patient with a T2DM diagnosis. She is currently on both oral (metformin) and parenteral (Novolog) therapies. Last Hgb A1c was 12.0% on 07/02/2022. In efforts to reduce risk of developing SSI, or other potential perioperative complications, this communication is being sent in order to determine if patient is deemed to have adequate medical optimization, including preoperative glycemic control.   With that being said, the benefit of improving glycemic control must be weighed against the overall risk associated with delaying a necessary vascular surgery for this patient. Sending result to surgeon for review. Also sending to PCP for follow up and consideration of treatment escalation vs consult with endocrinology.   Honor Loh, MSN, APRN, FNP-C, CEN [facility]  Peri-operative Services Nurse Practitioner Phone: [phone] 07/03/22 4:20 PM

## 2022-07-04 ENCOUNTER — Encounter: Payer: Self-pay | Admitting: Urgent Care

## 2022-07-04 ENCOUNTER — Encounter: Payer: Self-pay | Admitting: Vascular Surgery

## 2022-07-04 ENCOUNTER — Telehealth: Payer: Self-pay | Admitting: Cardiovascular Disease

## 2022-07-04 NOTE — Telephone Encounter (Signed)
Patient states she is returning a call received today, but does not know who called or what it may have been regarding. Please advise.

## 2022-07-04 NOTE — Telephone Encounter (Signed)
There is no documentation of any calls from our office.

## 2022-07-07 ENCOUNTER — Telehealth (INDEPENDENT_AMBULATORY_CARE_PROVIDER_SITE_OTHER): Payer: Self-pay

## 2022-07-07 ENCOUNTER — Encounter: Payer: Self-pay | Admitting: Vascular Surgery

## 2022-07-07 NOTE — Telephone Encounter (Signed)
A message was left for the patient to return a call. Patient surgery has been canceled due to infection.

## 2022-07-07 NOTE — Progress Notes (Signed)
Perioperative Services  Pre-Admission/Anesthesia Testing Clinical Review  Date: 07/07/22  Patient Demographics:  Name: Heather [name] DOB:   1951/02/28 MRN:   [id number]  Planned Surgical Procedure(s):    Case: [id number] Date/Time: 07/09/22 1030   Procedures:      ENDARTERECTOMY FEMORAL (SFA STENTS BILATERAL) (Bilateral)     APPLICATION OF CELL SAVER   Anesthesia type: General   Pre-op diagnosis: ASO WITH REST PAIN   Location: [place] 08 / [place] [facility]   Surgeons: [name], MD   NOTE: Available PAT nursing documentation and vital signs have been reviewed. Clinical nursing staff has updated patient's PMH/[name], current medication list, and drug allergies/intolerances to ensure comprehensive history available to assist in medical decision making as it pertains to the aforementioned surgical procedure and anticipated anesthetic course. Extensive review of available clinical information performed. Heather [name] updated with any diagnoses/procedures that  may have been inadvertently omitted during her intake with the pre-admission testing department's nursing staff.  Clinical Discussion:  Heather [name] is a 71 y.o. female who is submitted for pre-surgical anesthesia review and clearance prior to her undergoing the above procedure. Patient is a Current Smoker (72 pack years). Pertinent PMH includes: CAD, NSTEMI, angina, HFpEF, aortic stenosis (s/p TAVR), PVD, BILATERAL carotid artery disease, mesenteric ischemia, aortic atherosclerosis, HTN, HLD, T2DM, respiratory failure with hypoxia, COPD, GERD (no daily Tx), diabetic peripheral neuropathy, cervical DDD, anxiety, depression, schizophrenia, bipolar disorder, memory loss.  Patient is followed by cardiology Rockey Situ, MD). She was last seen in the cardiology clinic on 06/09/2022; notes reviewed.  At the time of her clinic visit, patient reporting episodes of chest pain at rest, chronic shortness of breath related  to her COPD diagnosis, and intermittent claudication in her BILATERAL lower extremities both at rest and with ambulation.  Patient denied any PND, orthopnea, palpitations, significant peripheral edema, vertiginous symptoms, or presyncope/syncope.  Patient with a past medical history significant for cardiovascular diagnoses.  Patient underwent diagnostic LEFT heart catheterization on 03/20/2014 revealing multivessel CAD; 70% mid LAD, 70% distal LCx, and 70% distal RCA.  PCI was subsequently performed placing a 2.5 x 28 mm Xience ALPINE DES to the mid LAD lesion yielding excellent angiographic result and TIMI-3 flow.  Patient underwent repeat diagnostic LEFT heart catheterization on 01/25/2016 revealing 90% ISR of the previously placed stent to the mid LAD.  On further evaluation there was mid LAD dissection within the stent.  PCI was performed placing a 2.75 x 22 mm resolute DES to the mid LAD.  Diagnostic RIGHT/LEFT heart catheterization performed on 09/29/2016 revealing multivessel CAD; 50% ISR of the mid LAD stent and 50% distal LAD.  No further intervention was required to the coronary arteries.  Mean PA pressure = 37 mmHg, mean PCWP = 24 mmHg, AO saturation = 90%, PA saturation = 46%, cardiac output 3.7 L/min, and 2.46 L/min/m.   Patient with severe aortic valve stenosis.  She ultimately underwent TAVR on 09/30/2016 placing a 23 mm SAPIEN 3 valve via a suprasternal approach.  Patient has a history of significant PVD.  Patient underwent PTA and subsequent stent placement to the SMA on 11/13/2020.  Patient then underwent PTA and stenting of the RIGHT SFA and popliteal on 04/12/2021.  Most recently, patient had PTA and kissing balloon stents placed to the [place] on 05/28/2021.  There is known BILATERAL carotid artery disease.  Most recent carotid Doppler performed on 03/18/2021 revealed a o-39% stenosis of the RICA and a 40-59%  contralateral stenosis in the LICA.  Given patient's significant  history of PAD, she remains on daily DAPT therapy using ASA + clopidogrel.  Patient is reportedly compliant with therapy with no evidence or reports of GI bleeding.  Blood pressure well controlled at 112/68 mmHg on currently prescribed diuretic (furosemide), ARB (losartan), and beta-blocker (metoprolol succinate) therapies.  Patient is on atorvastatin for her HLD diagnosis and further ASCVD prevention. T2DM diagnosis poorly controlled on currently prescribed regimen; last HgbA1c was 12.0% when checked on 07/02/2022. Patient does not have an OSAH diagnosis.  Patient is not active at baseline.  Functional capacity somewhat limited by overall deconditioning, episodes of chest pain, chronic shortness of breath, and intermittent claudication of the BILATERAL lower extremities.  With that being said, patient felt to be able to achieve at least 4 METS of activity without experiencing any significant increase in her angina/anginal equivalent symptoms.  Given her symptoms, patient was scheduled for noninvasive ischemic testing to further evaluate her complaints of intermittent chest pain and shortness of breath.  In the interim, while waiting on testing, patient was provided with a supply of short acting nitrates (NTG) to use on a as needed basis for significant recurrent angina/anginal equivalent symptoms.  No other changes were made to her medication regimen.  Patient to follow-up with cardiology following ordered testing for result review and ongoing management of her cardiovascular diagnoses.  Since being seen by cardiology, patient has undergone the noninvasive cardiovascular testing ordered by Dr. Rockey Situ.    Myocardial perfusion imaging study was performed on 06/12/2022 revealing a normal left ventricular systolic function with a hyperdynamic LVEF of 93%.  There was no evidence of stress-induced myocardial ischemia or arrhythmia; no scintigraphic evidence of scar.  CT attenuation correction images detail of the  previously placed bioprosthetic aortic valve, coronary calcification, stenting of the LAD, and mild aortic atherosclerosis.  Study was determined overall to be low risk.  Heather [name] is scheduled for a BILATERAL ENDARTERECTOMY FEMORAL WITH SFA STENTING on 07/09/2022 with Dr. [name], MD.  Given patient's past medical history significant for cardiovascular diagnoses, presurgical cardiac clearance was sought by the PAT team. Per cardiology, "based ACC/AHA guidelines, the patient's past medical history, and the amount of time since her last clinic visit, this patient would be at an overall ACCEPTABLE risk for the planned procedure without further cardiovascular testing or intervention at this time".  Again, this patient is on daily DAPT therapy.  She has been instructed on recommendations for holding her clopidogrel for 5 days prior to her procedure with plans to restart as soon as postoperative bleeding risk felt to be minimized by her primary attending surgeon.  She is aware that her last dose of clopidogrel should be on 07/03/2022.  The patient will continue her daily low-dose ASA throughout her perioperative course.    Patient denies previous perioperative complications with anesthesia in the past. In review of the available records, it is noted that patient underwent a general anesthetic course at [facility] (ASA IV) in 09/2016 without documented complications.      07/01/2022    2:37 PM 06/09/2022    9:23 AM 06/09/2022    9:15 AM  Vitals with BMI  Height '5\' 0"'$  '5\' 0"'$  '5\' 0"'$   Weight 120 lbs 127 lbs 127 lbs  BMI 23.44 28.7 68.1  Systolic  157 262  Diastolic  68 68  Pulse  63 63    Providers/Specialists:   NOTE: Primary physician provider listed  below. Patient may have been seen by APP or partner within same practice.   PROVIDER ROLE / SPECIALTY LAST OV  Schnier, Dolores Lory, MD Vascular Surgery (Surgeon) 04/03/2022  [name], [place] Primary  Care Provider ???  Ida Rogue, MD Cardiology 06/09/2022   Allergies:  Iodinated contrast media, Sulfa antibiotics, and Shellfish allergy  Current Home Medications:   No current facility-administered medications for this encounter.    albuterol (VENTOLIN HFA) 108 (90 Base) MCG/ACT inhaler   aspirin EC 81 MG tablet   atorvastatin (LIPITOR) 80 MG tablet   cholecalciferol (VITAMIN D) 1000 units tablet   clopidogrel (PLAVIX) 75 MG tablet   diphenhydrAMINE (BENADRYL) 25 mg capsule   escitalopram (LEXAPRO) 20 MG tablet   famotidine (PEPCID) 20 MG tablet   fluticasone (FLONASE) 50 MCG/ACT nasal spray   Fluticasone-Salmeterol (ADVAIR) 250-50 MCG/DOSE AEPB   furosemide (LASIX) 20 MG tablet   Insulin Syringe-Needle U-100 30G X 5/16" 1 ML MISC   LEVEMIR FLEXTOUCH 100 UNIT/ML FlexPen   losartan (COZAAR) 50 MG tablet   meclizine (ANTIVERT) 25 MG tablet   metFORMIN (GLUCOPHAGE-XR) 750 MG 24 hr tablet   metoprolol succinate (TOPROL-XL) 25 MG 24 hr tablet   Multiple Vitamins-Minerals (WOMENS MULTI VITAMIN & MINERAL PO)   nitroGLYCERIN (NITROSTAT) 0.4 MG SL tablet   NOVOLOG FLEXPEN 100 UNIT/ML FlexPen   pregabalin (LYRICA) 50 MG capsule   traMADol (ULTRAM) 50 MG tablet   traZODone (DESYREL) 50 MG tablet   History:   Past Medical History:  Diagnosis Date   (HFpEF) heart failure with preserved ejection fraction (HCC)    Angina pectoris (HCC)    Anxiety    Aortic atherosclerosis (HCC)    Basal cell carcinoma    Bilateral carotid artery disease (HCC)    a.) carotid doppler 03/18/2021: 0-99% RICA, 83-38% LICA   Bipolar disorder (HCC)    Cervicalgia    Chronic mesenteric ischemia (HCC)    a.) s/p PTA 11/13/2020 --> 6 x 16 mm lifestream ballon expanding stent to SMA   COPD (chronic obstructive pulmonary disease) (HCC)    Coronary artery disease 03/20/2014   a.) LHC/PCI 03/20/2014: 70% mLAD (2.5 x 28 mm Xience Alpine DES), 70% dLCx, 70% dRCA; b.) LHC 01/25/2016: 90% ISR mLAD --> mLAD  dissection with in stent --> 2.75 x 22 mm Resolute DES; c.) R/LHC 09/29/2016: 50% ISR mLAD, 70% dLAD, mPA 37, PCWP 24, AO sat 90%, PA sat 46%, CO 3.7, CI 2.46 - med mgmt   DDD (degenerative disc disease), cervical    Depression    Diabetic polyneuropathy (HCC)    GERD (gastroesophageal reflux disease)    Hyperlipidemia    Hypertension    Long term current use of antithrombotics/antiplatelets    a.) on DAPT (ASA + clopidogrel)   Memory loss    NSTEMI (non-ST elevated myocardial infarction) ([place]) 07/2016   a.) troponins trended (normal < 0.034 ng/mL): 0.325 --> 1.480 --> 2.330 --> 1.660 --> 0.169 ng/mL   PAD (peripheral artery disease) ([place])    a.) s/p PTA and stenting SMA 11/13/2020; b.) s/p PTA and stenting of RIGHT SFA and popliteal 04/12/2021; c.) s/p PTA and kissing balloon stents to BILATERAL CIAs 05/28/2021   Respiratory failure with hypoxia (HCC)    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   a.) 23 mm Sapien 3 via suprasternal approach   Schizophrenia ([place])    Severe aortic stenosis    a.) s/p TAVR 09/30/2016; 23 mm Sapien 3 via a suprasternal approach  Type 2 diabetes mellitus treated with insulin (HCC)    Vertigo    Past Surgical History:  Procedure Laterality Date   AORTIC VALVE REPLACEMENT  09/30/2016   Procedure: TRANSCATHETER AORTIC VALVE REPAIR   BLADDER SURGERY     CATARACT EXTRACTION, BILATERAL     CHOLECYSTECTOMY     COLONOSCOPY     CORONARY ANGIOPLASTY WITH STENT PLACEMENT  03/20/2014   Procedure: CORONARY ANGIOPLASTY WITH STENT PLACEMENT; Location: [facility]; Surgeon: Jacques Earthly, MD   CORONARY ANGIOPLASTY WITH STENT PLACEMENT Left 01/25/2016   Procedure: CORONARY ANGIOPLASTY WITH STENT PLACEMENT; Location: [facility]; Surgeon: Jacques Earthly, MD   FOOT SURGERY Right    HEMORRHOID SURGERY     IRRIGATION AND DEBRIDEMENT HEMATOMA Left 07/08/2014   GROIN   LOWER EXTREMITY ANGIOGRAPHY Right 04/12/2021   Procedure: Lower Extremity Angiography;   Surgeon: [name], MD;  Location: [place] [facility];  Service: Cardiovascular;  Laterality: Right;   LOWER EXTREMITY ANGIOGRAPHY Left 05/28/2021   Procedure: LOWER EXTREMITY ANGIOGRAPHY;  Surgeon: [name], MD;  Location: [place] [facility];  Service: Cardiovascular;  Laterality: Left;   LOWER EXTREMITY ANGIOGRAPHY Left 03/25/2022   Procedure: Lower Extremity Angiography;  Surgeon: [name], MD;  Location: [place] [facility];  Service: Cardiovascular;  Laterality: Left;   PSEUDOANEURYSM REPAIR Left 06/30/2014   FEMORAL ARTERY   RIGHT/LEFT HEART CATH AND CORONARY ANGIOGRAPHY Bilateral 09/29/2016   Procedure: RIGHT/LEFT HEART CATH AND CORONARY ANGIOGRAPHY; Location [facility]; Surgeon: Jacques Earthly, MD   TUBAL LIGATION     VISCERAL ANGIOGRAPHY N/A 11/13/2020   Procedure: VISCERAL ANGIOGRAPHY;  Surgeon: [name], MD;  Location: [place] [facility];  Service: Cardiovascular;  Laterality: N/A;   Family History  Problem Relation Age of Onset   Mental illness Mother    Breast cancer Neg Hx    Social History   Tobacco Use   Smoking status: Every Day    Packs/day: 1.50    Years: 48.00    Total pack years: 72.00    Types: Cigarettes   Smokeless tobacco: Never  Vaping Use   Vaping Use: Never used  Substance Use Topics   Alcohol use: Not Currently   Drug use: Not Currently    Pertinent Clinical Results:  LABS: Labs reviewed: Acceptable for surgery.  No visits with results within 3 Day(s) from this visit.  Latest known visit with results is:  Hospital Outpatient Visit on 07/02/2022  Component Date Value Ref Range Status   MRSA, PCR 07/02/2022 NEGATIVE  NEGATIVE Final   Staphylococcus aureus 07/02/2022 NEGATIVE  NEGATIVE Final   Comment: (NOTE) The Xpert SA Assay (FDA approved for NASAL specimens in patients 5 years of age and older), is one component of a comprehensive surveillance program. It is not intended to diagnose infection nor  to guide or monitor treatment. Performed at [facility], [address] [place], [place] [address]    ABO/RH(D) 07/02/2022 O POS   Final   Antibody Screen 07/02/2022 NEG   Final   Sample Expiration 07/02/2022 07/16/2022,2359   Final   Extend sample reason 07/02/2022    Final                   Value:NO TRANSFUSIONS OR PREGNANCY IN THE PAST 3 MONTHS Performed at [facility], [place], [place] [address]    Sodium 07/02/2022 136  135 - 145 mmol/L Final   Potassium 07/02/2022 4.2  3.5 - 5.1 mmol/L Final  Chloride 07/02/2022 100  98 - 111 mmol/L Final   CO2 07/02/2022 25  22 - 32 mmol/L Final   Glucose, Bld 07/02/2022 319 (H)  70 - 99 mg/dL Final   Glucose reference range applies only to samples taken after fasting for at least 8 hours.   BUN 07/02/2022 26 (H)  8 - 23 mg/dL Final   Creatinine, Ser 07/02/2022 1.04 (H)  0.44 - 1.00 mg/dL Final   Calcium 07/02/2022 9.4  8.9 - 10.3 mg/dL Final   GFR, Estimated 07/02/2022 58 (L)  >60 mL/min Final   Comment: (NOTE) Calculated using the CKD-EPI Creatinine Equation (2021)    Anion gap 07/02/2022 11  5 - 15 Final   Performed at [facility], [place], [place] [address]   WBC 07/02/2022 8.2  4.0 - 10.5 K/uL Final   RBC 07/02/2022 5.30 (H)  3.87 - 5.11 MIL/uL Final   Hemoglobin 07/02/2022 15.3 (H)  12.0 - 15.0 g/dL Final   HCT 07/02/2022 46.7 (H)  36.0 - 46.0 % Final   MCV 07/02/2022 88.1  80.0 - 100.0 fL Final   MCH 07/02/2022 28.9  26.0 - 34.0 pg Final   MCHC 07/02/2022 32.8  30.0 - 36.0 g/dL Final   RDW 07/02/2022 14.6  11.5 - 15.5 % Final   Platelets 07/02/2022 346  150 - 400 K/uL Final   nRBC 07/02/2022 0.0  0.0 - 0.2 % Final   Performed at [facility], [place], [place] [address]   Hgb A1c MFr Bld 07/02/2022 12.0 (H)  4.8 - 5.6 % Final   Comment: (NOTE) Pre diabetes:          5.7%-6.4%  Diabetes:              >6.4%  Glycemic control for    <7.0% adults with diabetes    Mean Plasma Glucose 07/02/2022 297.7  mg/dL Final   Performed at [facility], [place] [address], [place], [place] [address]    ECG: Date: 06/09/2022 Time ECG obtained: 0923 AM Rate: 63 bpm Rhythm:  Normal sinus rhythm with sinus arrhythmia Axis (leads I and aVF): Normal Intervals: PR 150 ms. QRS 92 ms. QTc 452 ms. ST segment and T wave changes: No evidence of acute ST segment elevation or depression Comparison: Similar to previous tracing obtained on 11/09/2020   IMAGING / PROCEDURES: MYOCARDIAL PERFUSION IMAGING STUDY (LEXISCAN) performed on 06/12/2022 Normal left ventricular systolic function with a hyperdynamic LVEF of 93% CT attenuation correction images detail prosthetic aortic valve, coronary artery calcification, stenting of the LAD, and mild aortic atherosclerosis There was no evidence of significant myocardial ischemia or arrhythmia Low risk study  CT ANGIO ABD/PEL W/ AND/OR W/O performed on 11/10/2020 No acute intra-abdominal or pelvic pathology No CT evidence of mesenteric ischemia Advanced atherosclerotic calcification of the aorta and mesenteric vessels Colonic diverticulosis No bowel obstruction Normal appendix  TRANSTHORACIC ECHOCARDIOGRAM performed on 11/18/2017 Normal left ventricular systolic function with an EF of 60-65%. Left ventricular diastolic Doppler parameters consistent with abnormal relaxation (G1DD). Status post TAVR; prosthetic valve well-seated and functioning normally Degenerative mitral valve disease with mitral annular calcification Mild left atrial dilatation Mild paravalvular prosthetic aortic valve regurgitation Normal right ventricular size and function  RIGHT/LEFT HEART CATHETERIZATION AND CORONARY ANGIOGRAPHY performed on 09/29/2016 Severe aortic regurgitation  No significant aortic stenosis - peak to peak systolic gradient was 15 mm Hg in the setting of severe AR  Septal hypokinesis with preserved  LV  systolic function (estimated LVEF = 50%).  Coronary artery disease including 50% instent restenosis in the mid-LAD and 70% distal LAD stenosis. RCA is small and diffusely diseased  Elevated left ventricular filling pressures: LVEDP = 25 mm Hg and mean PCWP = 24 mm Hg  25% right renal artery stenosis   Impression and Plan:  Heather [name] has been referred for pre-anesthesia review and clearance prior to her undergoing the planned anesthetic and procedural courses. Available labs, pertinent testing, and imaging results were personally reviewed by me. This patient has been appropriately cleared by cardiology with an overall ACCEPTABLE risk of significant perioperative cardiovascular complications.  Based on clinical review performed today (07/07/22), barring any significant acute changes in the patient's overall condition, it is anticipated that she will be able to proceed with the planned surgical intervention. Any acute changes in clinical condition may necessitate her procedure being postponed and/or cancelled. Patient will meet with anesthesia team (MD and/or CRNA) on the day of her procedure for preoperative evaluation/assessment. Questions regarding anesthetic course will be fielded at that time.   Pre-surgical instructions were reviewed with the patient during her PAT appointment and questions were fielded by PAT clinical staff. Patient was advised that if any questions or concerns arise prior to her procedure then she should return a call to PAT and/or her surgeon's office to discuss.  Honor Loh, MSN, APRN, FNP-C, CEN [facility]  Peri-operative Services Nurse Practitioner Phone: [phone] Fax: [phone] 07/07/22 11:18 AM  NOTE: This note has been prepared using Dragon dictation software. Despite my best ability to proofread, there is always the potential that unintentional transcriptional errors may still occur from this process.

## 2022-07-08 NOTE — Telephone Encounter (Signed)
Patient returned my call today and I explained about her surgery being moved to 07/30/22 due to her infection and that when that has cleared up to let us know if it is before the date of the surgery. I also advised that if she is still having issues to let me know and we can move her surgery out further if need be. Patient stated she understood.

## 2022-07-30 ENCOUNTER — Inpatient Hospital Stay: Admission: RE | Admit: 2022-07-30 | Payer: Medicare HMO | Source: Home / Self Care | Admitting: Vascular Surgery

## 2022-07-30 ENCOUNTER — Encounter: Admission: RE | Payer: Self-pay | Source: Home / Self Care

## 2022-07-30 HISTORY — DX: Gastro-esophageal reflux disease without esophagitis: K21.9

## 2022-07-30 HISTORY — DX: Other amnesia: R41.3

## 2022-07-30 HISTORY — DX: Basal cell carcinoma of skin, unspecified: C44.91

## 2022-07-30 HISTORY — DX: Long term (current) use of insulin: Z79.4

## 2022-07-30 HISTORY — DX: Long term (current) use of antithrombotics/antiplatelets: Z79.02

## 2022-07-30 HISTORY — DX: Chronic vascular disorders of intestine: K55.1

## 2022-07-30 HISTORY — DX: Atherosclerosis of aorta: I70.0

## 2022-07-30 HISTORY — DX: Angina pectoris, unspecified: I20.9

## 2022-07-30 HISTORY — DX: Type 2 diabetes mellitus with diabetic polyneuropathy: E11.42

## 2022-07-30 HISTORY — DX: Unspecified diastolic (congestive) heart failure: I50.30

## 2022-07-30 HISTORY — DX: Nonrheumatic aortic (valve) stenosis: I35.0

## 2022-07-30 HISTORY — DX: Respiratory failure, unspecified with hypoxia: J96.91

## 2022-07-30 HISTORY — DX: Cervicalgia: M54.2

## 2022-07-30 HISTORY — DX: Type 2 diabetes mellitus without complications: E11.9

## 2022-07-30 HISTORY — DX: Disorder of arteries and arterioles, unspecified: I77.9

## 2022-07-30 SURGERY — ENDARTERECTOMY, FEMORAL
Anesthesia: General

## 2022-08-03 ENCOUNTER — Emergency Department: Payer: Medicare HMO

## 2022-08-03 ENCOUNTER — Other Ambulatory Visit: Payer: Self-pay

## 2022-08-03 ENCOUNTER — Encounter (HOSPITAL_COMMUNITY): Payer: Self-pay

## 2022-08-03 ENCOUNTER — Emergency Department
Admission: EM | Admit: 2022-08-03 | Discharge: 2022-08-04 | Disposition: A | Discharge status: Other Acute Inpatient Hospital | Payer: Medicare HMO | Attending: Emergency Medicine | Admitting: Emergency Medicine

## 2022-08-03 DIAGNOSIS — S0990XA Unspecified injury of head, initial encounter: Secondary | ICD-10-CM | POA: Diagnosis present

## 2022-08-03 DIAGNOSIS — E119 Type 2 diabetes mellitus without complications: Secondary | ICD-10-CM | POA: Insufficient documentation

## 2022-08-03 DIAGNOSIS — Y92002 Bathroom of unspecified non-institutional (private) residence single-family (private) house as the place of occurrence of the external cause: Secondary | ICD-10-CM | POA: Diagnosis not present

## 2022-08-03 DIAGNOSIS — M79605 Pain in left leg: Secondary | ICD-10-CM | POA: Diagnosis not present

## 2022-08-03 DIAGNOSIS — W1839XA Other fall on same level, initial encounter: Secondary | ICD-10-CM | POA: Diagnosis not present

## 2022-08-03 DIAGNOSIS — I614 Nontraumatic intracerebral hemorrhage in cerebellum: Secondary | ICD-10-CM | POA: Diagnosis not present

## 2022-08-03 DIAGNOSIS — Z7902 Long term (current) use of antithrombotics/antiplatelets: Secondary | ICD-10-CM | POA: Diagnosis not present

## 2022-08-03 DIAGNOSIS — I739 Peripheral vascular disease, unspecified: Secondary | ICD-10-CM

## 2022-08-03 DIAGNOSIS — Z7982 Long term (current) use of aspirin: Secondary | ICD-10-CM | POA: Diagnosis not present

## 2022-08-03 DIAGNOSIS — I629 Nontraumatic intracranial hemorrhage, unspecified: Secondary | ICD-10-CM

## 2022-08-03 DIAGNOSIS — I10 Essential (primary) hypertension: Secondary | ICD-10-CM | POA: Insufficient documentation

## 2022-08-03 DIAGNOSIS — M79604 Pain in right leg: Secondary | ICD-10-CM | POA: Diagnosis not present

## 2022-08-03 DIAGNOSIS — J449 Chronic obstructive pulmonary disease, unspecified: Secondary | ICD-10-CM | POA: Diagnosis not present

## 2022-08-03 DIAGNOSIS — I2510 Atherosclerotic heart disease of native coronary artery without angina pectoris: Secondary | ICD-10-CM | POA: Diagnosis not present

## 2022-08-03 DIAGNOSIS — R519 Headache, unspecified: Secondary | ICD-10-CM | POA: Diagnosis present

## 2022-08-03 DIAGNOSIS — S06300A Unspecified focal traumatic brain injury without loss of consciousness, initial encounter: Secondary | ICD-10-CM | POA: Diagnosis not present

## 2022-08-03 DIAGNOSIS — R000 Tachycardia, unspecified: Secondary | ICD-10-CM | POA: Insufficient documentation

## 2022-08-03 DIAGNOSIS — M545 Low back pain, unspecified: Secondary | ICD-10-CM

## 2022-08-03 DIAGNOSIS — Z20822 Contact with and (suspected) exposure to covid-19: Secondary | ICD-10-CM | POA: Insufficient documentation

## 2022-08-03 LAB — CBC
HCT: 43.1 % (ref 36.0–46.0)
Hemoglobin: 14.1 g/dL (ref 12.0–15.0)
MCH: 29.3 pg (ref 26.0–34.0)
MCHC: 32.7 g/dL (ref 30.0–36.0)
MCV: 89.6 fL (ref 80.0–100.0)
Platelets: 375 10*3/uL (ref 150–400)
RBC: 4.81 MIL/uL (ref 3.87–5.11)
RDW: 14.4 % (ref 11.5–15.5)
WBC: 13.6 10*3/uL — ABNORMAL HIGH (ref 4.0–10.5)
nRBC: 0 % (ref 0.0–0.2)

## 2022-08-03 LAB — TROPONIN I (HIGH SENSITIVITY): Troponin I (High Sensitivity): 15 ng/L (ref <18)

## 2022-08-03 LAB — PROTIME-INR
INR: 1 (ref 0.8–1.2)
Prothrombin Time: 12.6 seconds (ref 11.4–15.2)

## 2022-08-03 LAB — BASIC METABOLIC PANEL
Anion gap: 14 (ref 5–15)
BUN: 37 mg/dL — ABNORMAL HIGH (ref 8–23)
CO2: 21 mmol/L — ABNORMAL LOW (ref 22–32)
Calcium: 9.6 mg/dL (ref 8.9–10.3)
Chloride: 100 mmol/L (ref 98–111)
Creatinine, Ser: 1.26 mg/dL — ABNORMAL HIGH (ref 0.44–1.00)
GFR, Estimated: 46 mL/min — ABNORMAL LOW (ref >60)
Glucose, Bld: 454 mg/dL — ABNORMAL HIGH (ref 70–99)
Potassium: 3.3 mmol/L — ABNORMAL LOW (ref 3.5–5.1)
Sodium: 135 mmol/L (ref 135–145)

## 2022-08-03 LAB — CBG MONITORING, ED: Glucose-Capillary: 412 mg/dL — ABNORMAL HIGH (ref 70–99)

## 2022-08-03 LAB — RESP PANEL BY RT-PCR (RSV, FLU A&B, COVID)  RVPGX2
Influenza A by PCR: NEGATIVE
Influenza B by PCR: NEGATIVE
Resp Syncytial Virus by PCR: NEGATIVE
SARS Coronavirus 2 by RT PCR: NEGATIVE

## 2022-08-03 LAB — TYPE AND SCREEN
ABO/RH(D): O POS
Antibody Screen: NEGATIVE

## 2022-08-03 MED ORDER — DIPHENHYDRAMINE HCL 50 MG/ML IJ SOLN
50.0000 mg | Freq: Once | INTRAMUSCULAR | Status: AC
  Administered 2022-08-03: 50 mg via INTRAVENOUS
  Filled 2022-08-03: qty 1

## 2022-08-03 MED ORDER — SODIUM CHLORIDE 0.9 % IV SOLN
250.0000 mg | Freq: Once | INTRAVENOUS | Status: DC
  Filled 2022-08-03: qty 4

## 2022-08-03 MED ORDER — IOHEXOL 350 MG/ML SOLN
75.0000 mL | Freq: Once | INTRAVENOUS | Status: AC | PRN
  Administered 2022-08-03: 75 mL via INTRAVENOUS

## 2022-08-03 MED ORDER — ONDANSETRON HCL 4 MG/2ML IJ SOLN
4.0000 mg | Freq: Once | INTRAMUSCULAR | Status: AC
  Administered 2022-08-03: 4 mg via INTRAVENOUS
  Filled 2022-08-03: qty 2

## 2022-08-03 MED ORDER — FENTANYL CITRATE PF 50 MCG/ML IJ SOSY
50.0000 ug | PREFILLED_SYRINGE | Freq: Once | INTRAMUSCULAR | Status: AC
  Administered 2022-08-04: 50 ug via INTRAVENOUS
  Filled 2022-08-03: qty 1

## 2022-08-03 MED ORDER — FENTANYL CITRATE PF 50 MCG/ML IJ SOSY
50.0000 ug | PREFILLED_SYRINGE | Freq: Once | INTRAMUSCULAR | Status: AC
  Administered 2022-08-03: 50 ug via INTRAVENOUS
  Filled 2022-08-03: qty 1

## 2022-08-03 MED ORDER — METHYLPREDNISOLONE SODIUM SUCC 125 MG IJ SOLR
INTRAMUSCULAR | Status: AC
  Filled 2022-08-03: qty 4

## 2022-08-03 MED ORDER — METHYLPREDNISOLONE SODIUM SUCC 125 MG IJ SOLR: 125.0000 mg | Freq: Once | INTRAMUSCULAR | Status: DC

## 2022-08-03 MED ORDER — HYDROCORTISONE SOD SUC (PF) 100 MG IJ SOLR
200.0000 mg | Freq: Once | INTRAMUSCULAR | Status: DC
  Filled 2022-08-03: qty 4

## 2022-08-03 MED ORDER — NICARDIPINE HCL IN NACL 20-0.86 MG/200ML-% IV SOLN
3.0000 mg/h | INTRAVENOUS | Status: DC
  Administered 2022-08-03: 5 mg/h via INTRAVENOUS
  Filled 2022-08-03 (×2): qty 200

## 2022-08-03 MED ORDER — LABETALOL HCL 5 MG/ML IV SOLN
10.0000 mg | Freq: Once | INTRAVENOUS | Status: AC
  Administered 2022-08-03: 10 mg via INTRAVENOUS
  Filled 2022-08-03: qty 4

## 2022-08-03 MED ORDER — SODIUM CHLORIDE 0.9 % IV SOLN
20.0000 ug | Freq: Once | INTRAVENOUS | Status: AC
  Administered 2022-08-04: 20 ug via INTRAVENOUS
  Filled 2022-08-03: qty 5

## 2022-08-03 NOTE — Plan of Care (Signed)
Brief Phone discussion with ED team at [facility]:  Briefly, Ms. Karey Suthers is a 71 y.o. female with hx of smoker, severe AI/AS s/p TAVR, CAD s/p PCI and on DAPT, COPD, PAD, HTN who presented to [facility] ED with sudden onset headache, dizziness while sitting on the toilet. She fell back and hit her back but not her head.  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  She is being given DDAVP  Plan: - Admit to Neuro ICU with Neurology Primary. - Stability scan in 6 hours or STAT with any neurological decline - Frequent neuro checks; q72mn for 1 hour, then q1hour - No antiplatelets or anticoagulants due to [place]- SCD for DVT prophylaxis, pharmacological DVT ppx at 24 hours if ICH is stable - Blood pressure control with goal systolic 1482- 1500 cleverplex and labetalol PRN - Stroke labs, HgbA1c, fasting lipid panel - MRI brain with and without contrast when stabilized to evaluate for underlying mass - CT angio head and neck to evaluate for underlying vascular abnormality. - Risk factor modification - Echocardiogram - PT consult, OT consult, Speech consult. - Will need to discuss with Neurosurgery given the proximity to brainstem and risk of hydro given the sensitive location of this ICH.  [facility]  [place]illePager Number [phone]

## 2022-08-03 NOTE — ED Provider Notes (Signed)
[facility] Provider Note    Event Date/Time   First MD Initiated Contact with Patient 08/03/22 2209     (approximate)   History   Dizziness and Headache   HPI  Heather [name] is a 71 y.o. female with a history of atherosclerosis, CAD, hypertension, type 2 diabetes, COPD, hyperlipidemia, aortic stenosis status post TAVR on aspirin and Plavix who presents with acute onset of severe headache and dizziness approximately an hour prior to arrival.  The patient states that she was on the toilet when the symptoms started.  She then fell but did not hit her head or lose consciousness.  She subsequently started having low back pain and is now also complaining of bilateral leg pain.  She denies any prior history of similar headache.  She has associated nausea.  I reviewed the past medical records.  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  She has followed with Dr. Delana Meyer for vascular surgery and had iliac stents placed in 2022.   Physical Exam   Triage Vital Signs: ED Triage Vitals  Enc Vitals Group     BP 08/03/22 2142 (!) 180/95     Pulse Rate 08/03/22 2142 100     Resp 08/03/22 2147 20     Temp 08/03/22 2142 98 F (36.7 C)     Temp Source 08/03/22 2142 Oral     SpO2 08/03/22 2142 95 %     Weight 08/03/22 2209 129 lb 4.8 oz (58.7 kg)     Height --      Head Circumference --      Peak Flow --      Pain Score 08/03/22 2147 8     Pain Loc --      Pain Edu? --      Excl. in [place]? --     Most recent vital signs: Vitals:   08/03/22 2255 08/03/22 2258  BP: (!) 125/92 (!) 198/52  Pulse: (!) 118 (!) 125  Resp: 16 16  Temp:    SpO2: 97% 99%     General: Alert and oriented, uncomfortable appearing. CV:  Diminished pulses to bilateral lower extremities.  Normal bilateral radial pulses. Resp:  Normal effort.  Lungs CTAB. Abd:  Soft and nontender.  No distention.   Other:  EOMI.  PERRLA.  Motor intact in all extremities.  Normal coordination.  No cervical or thoracic spinal tenderness.  Moderate lumbar spinal tenderness with no step-off or crepitus.  Bilateral distal lower extremities pale with minimally palpable pulses.  Left foot slightly cooler than right.   ED Results / Procedures / Treatments   Labs (all labs ordered are listed, but only abnormal results are displayed) Labs Reviewed  BASIC METABOLIC PANEL - Abnormal; Notable for the following components:      Result Value   Potassium 3.3 (*)    CO2 21 (*)    Glucose, Bld 454 (*)    BUN 37 (*)    Creatinine, Ser 1.26 (*)    GFR, Estimated 46 (*)    All other components within normal limits  CBC - Abnormal; Notable for the following components:   WBC 13.6 (*)    All other components within normal limits  CBG MONITORING, ED - Abnormal; Notable for the following components:   Glucose-Capillary 412 (*)    All other components within normal limits  RESP PANEL BY RT-PCR (RSV, FLU A&B, COVID)  [id number]  PROTIME-INR  TYPE AND SCREEN  TROPONIN I (HIGH SENSITIVITY)  TROPONIN I (HIGH SENSITIVITY)     EKG  ED ECG REPORT I, Arta Silence, the attending physician, personally viewed and interpreted this ECG.  Date: 08/03/2022 EKG Time: 2147 Rate: 85 Rhythm: normal sinus rhythm QRS Axis: normal Intervals: Prolonged QTc ST/T Wave abnormalities: normal Narrative Interpretation: no evidence of acute ischemia    RADIOLOGY  CT head: I independently viewed and interpreted the images; there is a approximately 4 cm cerebellar intraparenchymal hemorrhage.  Radiology report indicates mild mass effect but no evidence of herniation.  CT angio head/neck: Pending  CT lumbar spine: Pending   PROCEDURES:  Critical Care performed: Yes, see critical care procedure note(s)  .Critical Care  Performed by: Arta Silence, MD Authorized by: Arta Silence, MD   Critical care provider  statement:    Critical care time (minutes):  80   Critical care time was exclusive of:  Separately billable procedures and treating other patients   Critical care was necessary to treat or prevent imminent or life-threatening deterioration of the following conditions:  CNS failure or compromise   Critical care was time spent personally by me on the following activities:  Development of treatment plan with patient or surrogate, discussions with consultants, evaluation of patient's response to treatment, examination of patient, ordering and review of laboratory studies, ordering and review of radiographic studies, ordering and performing treatments and interventions, pulse oximetry, re-evaluation of patient's condition, review of old charts and obtaining history from patient or surrogate   Care discussed with: accepting provider at another facility      [facility]: Medications  nicardipine (CARDENE) '20mg'$  in 0.86% saline 257m IV infusion (0.1 mg/ml) (15 mg/hr Intravenous Infusion Verify 08/03/22 2311)  desmopressin (DDAVP) 20 mcg in sodium chloride 0.9 % 50 mL IVPB (has no administration in time range)  methylPREDNISolone sodium succinate (SOLU-MEDROL) 125 mg/2 mL injection 125 mg ( Intravenous Not Given 08/03/22 2300)  labetalol (NORMODYNE) injection 10 mg (has no administration in time range)  fentaNYL (SUBLIMAZE) injection 50 mcg (has no administration in time range)  fentaNYL (SUBLIMAZE) injection 50 mcg (50 mcg Intravenous Given 08/03/22 2222)  ondansetron (ZOFRAN) injection 4 mg (4 mg Intravenous Given 08/03/22 2231)  diphenhydrAMINE (BENADRYL) injection 50 mg (50 mg Intravenous Given 08/03/22 2242)  fentaNYL (SUBLIMAZE) injection 50 mcg (50 mcg Intravenous Given 08/03/22 2250)  iohexol (OMNIPAQUE) 350 MG/ML injection 75 mL (75 mLs Intravenous Contrast Given 08/03/22 2333)     IMPRESSION / [place]  I reviewed the triage vital signs and the  nursing notes.  71year old female with PMH as noted above, on aspirin and Plavix but no anticoagulation presents with severe headache and dizziness with a subsequent fall and low back and leg pain.  On exam the patient is hypertensive and tachycardic.  Her other vital signs are normal.  The patient is alert and oriented and neurologic exam is nonfocal.  She has lumbar spine tenderness.  Lower extremity neuroexam is normal although she has diminished pulses and pallor to bilateral lower extremities.  CT head was obtained from triage and shows a cerebellar hemorrhage.  Differential diagnosis includes, but is not limited to, most likely spontaneous cerebral hemorrhage, less likely traumatic.  Based on her examination I am also concerned for lumbar spine fracture versus contusion.  The patient has a history of significant PAD and the leg pain is likely due to claudication.  I have ordered a nicardipine  infusion to control the blood pressure, DDAVP for antiplatelet reversal, and will consult neurosurgery and/or neurology.  Patient's presentation is most consistent with acute presentation with potential threat to life or bodily function.  The patient is on the cardiac monitor to evaluate for evidence of arrhythmia and/or significant heart rate changes.   ----------------------------------------- 10:45 PM on 08/03/2022 -----------------------------------------  I consulted Dr. Cari Caraway from neurosurgery.  He reviewed the imaging and recommended that we consult neurology at [facility] for transfer.  I then consulted Dr. Lorrin Goodell from neurology at [facility] and discussed the case with him.  He recommended that the patient be transferred to the neuro ICU at [facility].  He then contacted [facility] while I was on the line to request an ICU bed.  [facility] subsequent called back a few minutes later to confirm that a bed was available and that the patient was accepted for transfer.  Dr. Lorrin Goodell agreed with the  current management of blood pressure control with nicardipine and antiplatelet reversal with DDAVP.  He recommended a target blood pressure of 400-867 systolic.  He also recommended a CT angio of the head and neck.  The patient has a documented contrast allergy.  I confirmed with the patient that she had hives but no anaphylaxis or airway symptoms.  Given the urgent nature of her condition, it is my judgment that the benefit of CT angio outweighs the risk of allergic reaction.  This is also the recommendation of neurology.  I discussed this with the patient including risks and benefits and she agrees.  We will pretreat with Benadryl and Solu-Medrol.  ----------------------------------------- 11:47 PM on 08/03/2022 -----------------------------------------  Patient's blood pressure is improving, most recently 619 systolic.  She is on the maximum nicardipine dose.  I will give an additional dose of labetalol to get it to the target range.  The patient remains alert and oriented with no change in her mental status while she has been here.  There is no indication for intubation at this time as she has now remained with a stable mental status for her entire ED stay of more than 2 hours.  The patient has been appropriately stabilized for transfer at this time.  [facility] is arriving shortly.  I have signed the patient out to the oncoming ED physician Dr. Tamala Julian.   FINAL CLINICAL IMPRESSION(S) / ED DIAGNOSES   Final diagnoses:  Intracranial hemorrhage ([place])  Peripheral artery disease (HCC)  Acute midline low back pain without sciatica     Rx / DC Orders   ED Discharge Orders     None        Note:  This document was prepared using Dragon voice recognition software and may include unintentional dictation errors.    Arta Silence, MD 08/03/22 2350

## 2022-08-03 NOTE — ED Notes (Signed)
EMTALA reviewed by this RN.  

## 2022-08-03 NOTE — ED Notes (Signed)
4N 16 - neuro ICU Report to [phone]

## 2022-08-03 NOTE — ED Triage Notes (Addendum)
Patient arrived via EMS, reports sudden onset of dizziness, blurred vision, headache, nausea and vomiting approximately one hour prior to arrival. States the dizziness made her fall in the bathroom. Denies LOC, denies hitting head. Reports right lower back pain from the fall. States, "My head feels funky!" States she has never had head pain like this in the past. No facial droop, no focal weakness. Pupils equal, reactive. Vital signs en route 209/76 CBG 330.

## 2022-08-04 ENCOUNTER — Encounter (HOSPITAL_COMMUNITY)
Admission: EM | Disposition: A | Discharge status: Skilled Nursing Facility | Payer: Self-pay | Source: Other Acute Inpatient Hospital | Attending: Internal Medicine

## 2022-08-04 ENCOUNTER — Inpatient Hospital Stay (HOSPITAL_COMMUNITY): Payer: Medicare HMO | Admitting: Anesthesiology

## 2022-08-04 ENCOUNTER — Inpatient Hospital Stay (HOSPITAL_COMMUNITY): Payer: Medicare HMO

## 2022-08-04 ENCOUNTER — Inpatient Hospital Stay (HOSPITAL_COMMUNITY)
Admission: RE | Admit: 2022-08-04 | Discharge: 2022-09-22 | DRG: 037 | Disposition: A | Discharge status: Skilled Nursing Facility | Payer: Medicare HMO | Source: Other Acute Inpatient Hospital | Attending: Family Medicine | Admitting: Family Medicine

## 2022-08-04 ENCOUNTER — Encounter (HOSPITAL_COMMUNITY): Payer: Self-pay | Admitting: Neurology

## 2022-08-04 DIAGNOSIS — S06300A Unspecified focal traumatic brain injury without loss of consciousness, initial encounter: Secondary | ICD-10-CM | POA: Diagnosis not present

## 2022-08-04 DIAGNOSIS — F319 Bipolar disorder, unspecified: Secondary | ICD-10-CM | POA: Diagnosis present

## 2022-08-04 DIAGNOSIS — F17210 Nicotine dependence, cigarettes, uncomplicated: Secondary | ICD-10-CM | POA: Diagnosis present

## 2022-08-04 DIAGNOSIS — G8929 Other chronic pain: Secondary | ICD-10-CM | POA: Diagnosis present

## 2022-08-04 DIAGNOSIS — J9602 Acute respiratory failure with hypercapnia: Secondary | ICD-10-CM | POA: Diagnosis not present

## 2022-08-04 DIAGNOSIS — R29704 NIHSS score 4: Secondary | ICD-10-CM | POA: Diagnosis present

## 2022-08-04 DIAGNOSIS — Z952 Presence of prosthetic heart valve: Secondary | ICD-10-CM

## 2022-08-04 DIAGNOSIS — Z7902 Long term (current) use of antithrombotics/antiplatelets: Secondary | ICD-10-CM

## 2022-08-04 DIAGNOSIS — T8141XA Infection following a procedure, superficial incisional surgical site, initial encounter: Secondary | ICD-10-CM | POA: Diagnosis not present

## 2022-08-04 DIAGNOSIS — G935 Compression of brain: Secondary | ICD-10-CM | POA: Diagnosis not present

## 2022-08-04 DIAGNOSIS — Z515 Encounter for palliative care: Secondary | ICD-10-CM | POA: Diagnosis not present

## 2022-08-04 DIAGNOSIS — M6282 Rhabdomyolysis: Secondary | ICD-10-CM | POA: Diagnosis not present

## 2022-08-04 DIAGNOSIS — R1312 Dysphagia, oropharyngeal phase: Secondary | ICD-10-CM | POA: Diagnosis not present

## 2022-08-04 DIAGNOSIS — T8149XA Infection following a procedure, other surgical site, initial encounter: Secondary | ICD-10-CM | POA: Diagnosis not present

## 2022-08-04 DIAGNOSIS — Z01812 Encounter for preprocedural laboratory examination: Secondary | ICD-10-CM

## 2022-08-04 DIAGNOSIS — I739 Peripheral vascular disease, unspecified: Secondary | ICD-10-CM | POA: Diagnosis not present

## 2022-08-04 DIAGNOSIS — J449 Chronic obstructive pulmonary disease, unspecified: Secondary | ICD-10-CM | POA: Diagnosis not present

## 2022-08-04 DIAGNOSIS — F17200 Nicotine dependence, unspecified, uncomplicated: Secondary | ICD-10-CM

## 2022-08-04 DIAGNOSIS — L97509 Non-pressure chronic ulcer of other part of unspecified foot with unspecified severity: Secondary | ICD-10-CM | POA: Diagnosis not present

## 2022-08-04 DIAGNOSIS — I2510 Atherosclerotic heart disease of native coronary artery without angina pectoris: Secondary | ICD-10-CM

## 2022-08-04 DIAGNOSIS — D72829 Elevated white blood cell count, unspecified: Secondary | ICD-10-CM | POA: Diagnosis not present

## 2022-08-04 DIAGNOSIS — I629 Nontraumatic intracranial hemorrhage, unspecified: Secondary | ICD-10-CM | POA: Diagnosis present

## 2022-08-04 DIAGNOSIS — I998 Other disorder of circulatory system: Secondary | ICD-10-CM

## 2022-08-04 DIAGNOSIS — J810 Acute pulmonary edema: Secondary | ICD-10-CM | POA: Diagnosis not present

## 2022-08-04 DIAGNOSIS — L89152 Pressure ulcer of sacral region, stage 2: Secondary | ICD-10-CM | POA: Diagnosis not present

## 2022-08-04 DIAGNOSIS — Z7984 Long term (current) use of oral hypoglycemic drugs: Secondary | ICD-10-CM

## 2022-08-04 DIAGNOSIS — E781 Pure hyperglyceridemia: Secondary | ICD-10-CM | POA: Diagnosis present

## 2022-08-04 DIAGNOSIS — D6489 Other specified anemias: Secondary | ICD-10-CM | POA: Diagnosis not present

## 2022-08-04 DIAGNOSIS — Z9841 Cataract extraction status, right eye: Secondary | ICD-10-CM

## 2022-08-04 DIAGNOSIS — B964 Proteus (mirabilis) (morganii) as the cause of diseases classified elsewhere: Secondary | ICD-10-CM | POA: Diagnosis not present

## 2022-08-04 DIAGNOSIS — J9601 Acute respiratory failure with hypoxia: Secondary | ICD-10-CM | POA: Diagnosis not present

## 2022-08-04 DIAGNOSIS — G4700 Insomnia, unspecified: Secondary | ICD-10-CM | POA: Diagnosis present

## 2022-08-04 DIAGNOSIS — I509 Heart failure, unspecified: Secondary | ICD-10-CM

## 2022-08-04 DIAGNOSIS — Z794 Long term (current) use of insulin: Secondary | ICD-10-CM | POA: Diagnosis not present

## 2022-08-04 DIAGNOSIS — E1165 Type 2 diabetes mellitus with hyperglycemia: Secondary | ICD-10-CM | POA: Diagnosis present

## 2022-08-04 DIAGNOSIS — I161 Hypertensive emergency: Secondary | ICD-10-CM | POA: Diagnosis present

## 2022-08-04 DIAGNOSIS — D62 Acute posthemorrhagic anemia: Secondary | ICD-10-CM | POA: Diagnosis not present

## 2022-08-04 DIAGNOSIS — E1169 Type 2 diabetes mellitus with other specified complication: Secondary | ICD-10-CM

## 2022-08-04 DIAGNOSIS — E11649 Type 2 diabetes mellitus with hypoglycemia without coma: Secondary | ICD-10-CM | POA: Diagnosis not present

## 2022-08-04 DIAGNOSIS — R197 Diarrhea, unspecified: Secondary | ICD-10-CM | POA: Diagnosis not present

## 2022-08-04 DIAGNOSIS — E1151 Type 2 diabetes mellitus with diabetic peripheral angiopathy without gangrene: Secondary | ICD-10-CM | POA: Diagnosis not present

## 2022-08-04 DIAGNOSIS — E872 Acidosis, unspecified: Secondary | ICD-10-CM | POA: Diagnosis present

## 2022-08-04 DIAGNOSIS — I6389 Other cerebral infarction: Secondary | ICD-10-CM | POA: Diagnosis not present

## 2022-08-04 DIAGNOSIS — Z7982 Long term (current) use of aspirin: Secondary | ICD-10-CM

## 2022-08-04 DIAGNOSIS — R638 Other symptoms and signs concerning food and fluid intake: Secondary | ICD-10-CM | POA: Insufficient documentation

## 2022-08-04 DIAGNOSIS — M5030 Other cervical disc degeneration, unspecified cervical region: Secondary | ICD-10-CM | POA: Diagnosis present

## 2022-08-04 DIAGNOSIS — Z955 Presence of coronary angioplasty implant and graft: Secondary | ICD-10-CM

## 2022-08-04 DIAGNOSIS — Y838 Other surgical procedures as the cause of abnormal reaction of the patient, or of later complication, without mention of misadventure at the time of the procedure: Secondary | ICD-10-CM | POA: Diagnosis not present

## 2022-08-04 DIAGNOSIS — I614 Nontraumatic intracerebral hemorrhage in cerebellum: Secondary | ICD-10-CM | POA: Diagnosis not present

## 2022-08-04 DIAGNOSIS — T501X5A Adverse effect of loop [high-ceiling] diuretics, initial encounter: Secondary | ICD-10-CM | POA: Diagnosis not present

## 2022-08-04 DIAGNOSIS — Z79899 Other long term (current) drug therapy: Secondary | ICD-10-CM

## 2022-08-04 DIAGNOSIS — I10 Essential (primary) hypertension: Secondary | ICD-10-CM | POA: Diagnosis present

## 2022-08-04 DIAGNOSIS — Z91041 Radiographic dye allergy status: Secondary | ICD-10-CM

## 2022-08-04 DIAGNOSIS — N179 Acute kidney failure, unspecified: Secondary | ICD-10-CM | POA: Diagnosis not present

## 2022-08-04 DIAGNOSIS — J811 Chronic pulmonary edema: Secondary | ICD-10-CM | POA: Diagnosis not present

## 2022-08-04 DIAGNOSIS — J440 Chronic obstructive pulmonary disease with acute lower respiratory infection: Secondary | ICD-10-CM | POA: Diagnosis not present

## 2022-08-04 DIAGNOSIS — Z882 Allergy status to sulfonamides status: Secondary | ICD-10-CM

## 2022-08-04 DIAGNOSIS — Z1152 Encounter for screening for COVID-19: Secondary | ICD-10-CM | POA: Diagnosis not present

## 2022-08-04 DIAGNOSIS — I7025 Atherosclerosis of native arteries of other extremities with ulceration: Secondary | ICD-10-CM | POA: Diagnosis not present

## 2022-08-04 DIAGNOSIS — K612 Anorectal abscess: Secondary | ICD-10-CM | POA: Diagnosis not present

## 2022-08-04 DIAGNOSIS — R34 Anuria and oliguria: Secondary | ICD-10-CM | POA: Diagnosis not present

## 2022-08-04 DIAGNOSIS — I70222 Atherosclerosis of native arteries of extremities with rest pain, left leg: Secondary | ICD-10-CM | POA: Diagnosis not present

## 2022-08-04 DIAGNOSIS — R1310 Dysphagia, unspecified: Secondary | ICD-10-CM

## 2022-08-04 DIAGNOSIS — R627 Adult failure to thrive: Secondary | ICD-10-CM | POA: Diagnosis present

## 2022-08-04 DIAGNOSIS — B9620 Unspecified Escherichia coli [E. coli] as the cause of diseases classified elsewhere: Secondary | ICD-10-CM | POA: Diagnosis present

## 2022-08-04 DIAGNOSIS — F0393 Unspecified dementia, unspecified severity, with mood disturbance: Secondary | ICD-10-CM | POA: Diagnosis present

## 2022-08-04 DIAGNOSIS — Z7189 Other specified counseling: Secondary | ICD-10-CM | POA: Diagnosis not present

## 2022-08-04 DIAGNOSIS — E1142 Type 2 diabetes mellitus with diabetic polyneuropathy: Secondary | ICD-10-CM | POA: Diagnosis present

## 2022-08-04 DIAGNOSIS — E785 Hyperlipidemia, unspecified: Secondary | ICD-10-CM

## 2022-08-04 DIAGNOSIS — F419 Anxiety disorder, unspecified: Secondary | ICD-10-CM | POA: Diagnosis present

## 2022-08-04 DIAGNOSIS — Z8673 Personal history of transient ischemic attack (TIA), and cerebral infarction without residual deficits: Secondary | ICD-10-CM

## 2022-08-04 DIAGNOSIS — D649 Anemia, unspecified: Secondary | ICD-10-CM | POA: Diagnosis not present

## 2022-08-04 DIAGNOSIS — Z953 Presence of xenogenic heart valve: Secondary | ICD-10-CM

## 2022-08-04 DIAGNOSIS — I110 Hypertensive heart disease with heart failure: Secondary | ICD-10-CM | POA: Diagnosis present

## 2022-08-04 DIAGNOSIS — Z9842 Cataract extraction status, left eye: Secondary | ICD-10-CM

## 2022-08-04 DIAGNOSIS — K219 Gastro-esophageal reflux disease without esophagitis: Secondary | ICD-10-CM

## 2022-08-04 DIAGNOSIS — U071 COVID-19: Secondary | ICD-10-CM | POA: Diagnosis not present

## 2022-08-04 DIAGNOSIS — I96 Gangrene, not elsewhere classified: Secondary | ICD-10-CM | POA: Diagnosis not present

## 2022-08-04 DIAGNOSIS — I700 Atherosclerosis of aorta: Secondary | ICD-10-CM | POA: Diagnosis present

## 2022-08-04 DIAGNOSIS — Z85828 Personal history of other malignant neoplasm of skin: Secondary | ICD-10-CM

## 2022-08-04 DIAGNOSIS — I471 Supraventricular tachycardia, unspecified: Secondary | ICD-10-CM | POA: Diagnosis not present

## 2022-08-04 DIAGNOSIS — S31109A Unspecified open wound of abdominal wall, unspecified quadrant without penetration into peritoneal cavity, initial encounter: Secondary | ICD-10-CM

## 2022-08-04 DIAGNOSIS — J988 Other specified respiratory disorders: Secondary | ICD-10-CM | POA: Diagnosis not present

## 2022-08-04 DIAGNOSIS — I252 Old myocardial infarction: Secondary | ICD-10-CM

## 2022-08-04 DIAGNOSIS — F03918 Unspecified dementia, unspecified severity, with other behavioral disturbance: Secondary | ICD-10-CM | POA: Diagnosis present

## 2022-08-04 DIAGNOSIS — Z8679 Personal history of other diseases of the circulatory system: Secondary | ICD-10-CM | POA: Diagnosis present

## 2022-08-04 DIAGNOSIS — J90 Pleural effusion, not elsewhere classified: Secondary | ICD-10-CM | POA: Diagnosis not present

## 2022-08-04 DIAGNOSIS — I5032 Chronic diastolic (congestive) heart failure: Secondary | ICD-10-CM | POA: Diagnosis present

## 2022-08-04 DIAGNOSIS — I619 Nontraumatic intracerebral hemorrhage, unspecified: Principal | ICD-10-CM | POA: Diagnosis present

## 2022-08-04 DIAGNOSIS — I70223 Atherosclerosis of native arteries of extremities with rest pain, bilateral legs: Secondary | ICD-10-CM | POA: Diagnosis not present

## 2022-08-04 DIAGNOSIS — K5900 Constipation, unspecified: Secondary | ICD-10-CM | POA: Diagnosis not present

## 2022-08-04 DIAGNOSIS — Z91013 Allergy to seafood: Secondary | ICD-10-CM

## 2022-08-04 DIAGNOSIS — I959 Hypotension, unspecified: Secondary | ICD-10-CM | POA: Diagnosis not present

## 2022-08-04 DIAGNOSIS — Z818 Family history of other mental and behavioral disorders: Secondary | ICD-10-CM

## 2022-08-04 DIAGNOSIS — F209 Schizophrenia, unspecified: Secondary | ICD-10-CM | POA: Diagnosis present

## 2022-08-04 DIAGNOSIS — R4182 Altered mental status, unspecified: Secondary | ICD-10-CM | POA: Diagnosis not present

## 2022-08-04 DIAGNOSIS — S31109D Unspecified open wound of abdominal wall, unspecified quadrant without penetration into peritoneal cavity, subsequent encounter: Secondary | ICD-10-CM | POA: Diagnosis not present

## 2022-08-04 HISTORY — PX: ENDARTERECTOMY FEMORAL: SHX5804

## 2022-08-04 HISTORY — PX: LOWER EXTREMITY ANGIOGRAM: SHX5508

## 2022-08-04 HISTORY — PX: PATCH ANGIOPLASTY: SHX6230

## 2022-08-04 HISTORY — PX: THROMBECTOMY FEMORAL ARTERY: SHX6406

## 2022-08-04 HISTORY — PX: INSERTION OF ILIAC STENT: SHX6256

## 2022-08-04 HISTORY — PX: FASCIECTOMY: SHX6525

## 2022-08-04 LAB — POCT I-STAT 7, (LYTES, BLD GAS, ICA,H+H)
Acid-base deficit: 7 mmol/L — ABNORMAL HIGH (ref 0.0–2.0)
Bicarbonate: 19.6 mmol/L — ABNORMAL LOW (ref 20.0–28.0)
Calcium, Ion: 1.26 mmol/L (ref 1.15–1.40)
HCT: 28 % — ABNORMAL LOW (ref 36.0–46.0)
Hemoglobin: 9.5 g/dL — ABNORMAL LOW (ref 12.0–15.0)
O2 Saturation: 100 %
Potassium: 3.5 mmol/L (ref 3.5–5.1)
Sodium: 143 mmol/L (ref 135–145)
TCO2: 21 mmol/L — ABNORMAL LOW (ref 22–32)
pCO2 arterial: 44.1 mmHg (ref 32–48)
pH, Arterial: 7.256 — ABNORMAL LOW (ref 7.35–7.45)
pO2, Arterial: 227 mmHg — ABNORMAL HIGH (ref 83–108)

## 2022-08-04 LAB — ECHOCARDIOGRAM COMPLETE
AR max vel: 2.02 cm2
AV Area VTI: 2.35 cm2
AV Area mean vel: 2.1 cm2
AV Mean grad: 9.2 mmHg
AV Peak grad: 17.8 mmHg
Ao pk vel: 2.11 m/s
Area-P 1/2: 4.02 cm2
Calc EF: 66.5 %
Height: 60 in
MV VTI: 1.9 cm2
S' Lateral: 2.3 cm
Single Plane A2C EF: 63.3 %
Single Plane A4C EF: 67.8 %
Weight: 2070.56 oz

## 2022-08-04 LAB — POCT I-STAT, CHEM 8
BUN: 25 mg/dL — ABNORMAL HIGH (ref 8–23)
Calcium, Ion: 1.2 mmol/L (ref 1.15–1.40)
Chloride: 109 mmol/L (ref 98–111)
Creatinine, Ser: 0.6 mg/dL (ref 0.44–1.00)
Glucose, Bld: 520 mg/dL (ref 70–99)
HCT: 34 % — ABNORMAL LOW (ref 36.0–46.0)
Hemoglobin: 11.6 g/dL — ABNORMAL LOW (ref 12.0–15.0)
Potassium: 3.2 mmol/L — ABNORMAL LOW (ref 3.5–5.1)
Sodium: 142 mmol/L (ref 135–145)
TCO2: 16 mmol/L — ABNORMAL LOW (ref 22–32)

## 2022-08-04 LAB — COMPREHENSIVE METABOLIC PANEL
ALT: 14 U/L (ref 0–44)
AST: 20 U/L (ref 15–41)
Albumin: 3.8 g/dL (ref 3.5–5.0)
Alkaline Phosphatase: 113 U/L (ref 38–126)
Anion gap: 18 — ABNORMAL HIGH (ref 5–15)
BUN: 30 mg/dL — ABNORMAL HIGH (ref 8–23)
CO2: 17 mmol/L — ABNORMAL LOW (ref 22–32)
Calcium: 9.1 mg/dL (ref 8.9–10.3)
Chloride: 101 mmol/L (ref 98–111)
Creatinine, Ser: 1.13 mg/dL — ABNORMAL HIGH (ref 0.44–1.00)
GFR, Estimated: 53 mL/min — ABNORMAL LOW (ref >60)
Glucose, Bld: 589 mg/dL (ref 70–99)
Potassium: 4.3 mmol/L (ref 3.5–5.1)
Sodium: 136 mmol/L (ref 135–145)
Total Bilirubin: 1.1 mg/dL (ref 0.3–1.2)
Total Protein: 6.9 g/dL (ref 6.5–8.1)

## 2022-08-04 LAB — GLUCOSE, CAPILLARY
Glucose-Capillary: 532 mg/dL (ref 70–99)
Glucose-Capillary: 392 mg/dL — ABNORMAL HIGH (ref 70–99)
Glucose-Capillary: 418 mg/dL — ABNORMAL HIGH (ref 70–99)
Glucose-Capillary: 315 mg/dL — ABNORMAL HIGH (ref 70–99)
Glucose-Capillary: 291 mg/dL — ABNORMAL HIGH (ref 70–99)
Glucose-Capillary: 266 mg/dL — ABNORMAL HIGH (ref 70–99)
Glucose-Capillary: 225 mg/dL — ABNORMAL HIGH (ref 70–99)
Glucose-Capillary: 197 mg/dL — ABNORMAL HIGH (ref 70–99)
Glucose-Capillary: 172 mg/dL — ABNORMAL HIGH (ref 70–99)
Glucose-Capillary: 174 mg/dL — ABNORMAL HIGH (ref 70–99)
Glucose-Capillary: 178 mg/dL — ABNORMAL HIGH (ref 70–99)
Glucose-Capillary: 166 mg/dL — ABNORMAL HIGH (ref 70–99)
Glucose-Capillary: 167 mg/dL — ABNORMAL HIGH (ref 70–99)

## 2022-08-04 LAB — RAPID URINE DRUG SCREEN, HOSP PERFORMED
Amphetamines: NOT DETECTED
Barbiturates: NOT DETECTED
Benzodiazepines: POSITIVE — AB
Cocaine: NOT DETECTED
Opiates: POSITIVE — AB
Tetrahydrocannabinol: NOT DETECTED

## 2022-08-04 LAB — SURGICAL PCR SCREEN
MRSA, PCR: NEGATIVE
Staphylococcus aureus: NEGATIVE

## 2022-08-04 LAB — HEPARIN LEVEL (UNFRACTIONATED): Heparin Unfractionated: 0.55 IU/mL (ref 0.30–0.70)

## 2022-08-04 LAB — POCT ACTIVATED CLOTTING TIME: Activated Clotting Time: 255 seconds

## 2022-08-04 LAB — ETHANOL: Alcohol, Ethyl (B): <10 mg/dL (ref <10)

## 2022-08-04 LAB — HIV ANTIBODY (ROUTINE TESTING W REFLEX): HIV Screen 4th Generation wRfx: NONREACTIVE

## 2022-08-04 SURGERY — THROMBECTOMY, ARTERY, FEMORAL
Anesthesia: General | Site: Leg Lower | Laterality: Bilateral

## 2022-08-04 MED ORDER — TRAMADOL HCL 50 MG PO TABS: 50.0000 mg | ORAL_TABLET | Freq: Four times a day (QID) | ORAL | Status: DC | PRN

## 2022-08-04 MED ORDER — ATORVASTATIN CALCIUM 80 MG PO TABS
80.0000 mg | ORAL_TABLET | Freq: Every day | ORAL | Status: DC
  Administered 2022-08-04 – 2022-08-05 (×2): 80 mg
  Filled 2022-08-04 (×2): qty 1

## 2022-08-04 MED ORDER — ORAL CARE MOUTH RINSE
15.0000 mL | OROMUCOSAL | Status: DC
  Administered 2022-08-04 – 2022-08-08 (×50): 15 mL via OROMUCOSAL

## 2022-08-04 MED ORDER — INSULIN DETEMIR 100 UNIT/ML ~~LOC~~ SOLN
24.0000 [IU] | Freq: Two times a day (BID) | SUBCUTANEOUS | Status: DC
  Administered 2022-08-04: 24 [IU] via SUBCUTANEOUS
  Filled 2022-08-04 (×4): qty 0.24

## 2022-08-04 MED ORDER — LIDOCAINE 2% (20 MG/ML) 5 ML SYRINGE
INTRAMUSCULAR | Status: AC
  Filled 2022-08-04: qty 5

## 2022-08-04 MED ORDER — ORAL CARE MOUTH RINSE: 15.0000 mL | OROMUCOSAL | Status: DC | PRN

## 2022-08-04 MED ORDER — FENTANYL CITRATE PF 50 MCG/ML IJ SOSY
PREFILLED_SYRINGE | INTRAMUSCULAR | Status: AC
  Administered 2022-08-04: 50 ug via INTRAVENOUS
  Filled 2022-08-04: qty 1

## 2022-08-04 MED ORDER — SENNOSIDES-DOCUSATE SODIUM 8.6-50 MG PO TABS
1.0000 | ORAL_TABLET | Freq: Two times a day (BID) | ORAL | Status: DC
  Filled 2022-08-04: qty 1

## 2022-08-04 MED ORDER — FENTANYL CITRATE PF 50 MCG/ML IJ SOSY: 50.0000 ug | PREFILLED_SYRINGE | Freq: Once | INTRAMUSCULAR | Status: AC

## 2022-08-04 MED ORDER — DIPHENHYDRAMINE HCL 50 MG/ML IJ SOLN
INTRAMUSCULAR | Status: DC | PRN
  Administered 2022-08-04: 25 mg via INTRAVENOUS

## 2022-08-04 MED ORDER — HEMOSTATIC AGENTS (NO CHARGE) OPTIME
TOPICAL | Status: DC | PRN
  Administered 2022-08-04: 1 via TOPICAL

## 2022-08-04 MED ORDER — ONDANSETRON HCL 4 MG/2ML IJ SOLN
INTRAMUSCULAR | Status: DC | PRN
  Administered 2022-08-04: 4 mg via INTRAVENOUS

## 2022-08-04 MED ORDER — BUDESONIDE 0.5 MG/2ML IN SUSP
0.5000 mg | Freq: Two times a day (BID) | RESPIRATORY_TRACT | Status: DC
  Administered 2022-08-04 – 2022-09-22 (×92): 0.5 mg via RESPIRATORY_TRACT
  Filled 2022-08-04 (×99): qty 2

## 2022-08-04 MED ORDER — IOHEXOL 350 MG/ML SOLN
75.0000 mL | Freq: Once | INTRAVENOUS | Status: AC | PRN
  Administered 2022-08-04: 75 mL via INTRAVENOUS

## 2022-08-04 MED ORDER — ROCURONIUM BROMIDE 100 MG/10ML IV SOLN
INTRAVENOUS | Status: DC | PRN
  Administered 2022-08-04: 60 mg via INTRAVENOUS
  Administered 2022-08-04: 40 mg via INTRAVENOUS
  Administered 2022-08-04: 20 mg via INTRAVENOUS

## 2022-08-04 MED ORDER — FAMOTIDINE 20 MG PO TABS: 20.0000 mg | ORAL_TABLET | Freq: Two times a day (BID) | ORAL | Status: DC | PRN

## 2022-08-04 MED ORDER — ORAL CARE MOUTH RINSE: 15.0000 mL | Freq: Once | OROMUCOSAL | Status: DC

## 2022-08-04 MED ORDER — MIDAZOLAM-SODIUM CHLORIDE 100-0.9 MG/100ML-% IV SOLN: 0.0000 mg/h | INTRAVENOUS | Status: DC

## 2022-08-04 MED ORDER — HYDROCORTISONE SOD SUC (PF) 100 MG IJ SOLR
INTRAMUSCULAR | Status: DC | PRN
  Administered 2022-08-04: 125 mg via INTRAVENOUS

## 2022-08-04 MED ORDER — FENTANYL CITRATE PF 50 MCG/ML IJ SOSY
50.0000 ug | PREFILLED_SYRINGE | INTRAMUSCULAR | Status: DC | PRN
  Administered 2022-08-04: 50 ug via INTRAVENOUS
  Filled 2022-08-04: qty 1

## 2022-08-04 MED ORDER — INSULIN ASPART 100 UNIT/ML IJ SOLN: 12.0000 [IU] | Freq: Once | INTRAMUSCULAR | Status: DC

## 2022-08-04 MED ORDER — TRAZODONE HCL 50 MG PO TABS: 25.0000 mg | ORAL_TABLET | Freq: Every evening | ORAL | Status: DC | PRN

## 2022-08-04 MED ORDER — ACETAMINOPHEN 160 MG/5ML PO SOLN
650.0000 mg | ORAL | Status: DC | PRN
  Administered 2022-08-05 – 2022-08-11 (×6): 650 mg
  Filled 2022-08-04 (×6): qty 20.3

## 2022-08-04 MED ORDER — PANTOPRAZOLE SODIUM 40 MG IV SOLR
40.0000 mg | Freq: Every day | INTRAVENOUS | Status: DC
  Administered 2022-08-04: 40 mg via INTRAVENOUS
  Filled 2022-08-04: qty 10

## 2022-08-04 MED ORDER — STROKE: EARLY STAGES OF RECOVERY BOOK: Freq: Once | Status: DC

## 2022-08-04 MED ORDER — MIDAZOLAM HCL 2 MG/2ML IJ SOLN
INTRAMUSCULAR | Status: AC
  Filled 2022-08-04: qty 2

## 2022-08-04 MED ORDER — INSULIN ASPART 100 UNIT/ML IJ SOLN: 0.0000 [IU] | INTRAMUSCULAR | Status: DC

## 2022-08-04 MED ORDER — ORAL CARE MOUTH RINSE: 15.0000 mL | OROMUCOSAL | Status: DC

## 2022-08-04 MED ORDER — GADOBUTROL 1 MMOL/ML IV SOLN
6.0000 mL | Freq: Once | INTRAVENOUS | Status: AC | PRN
  Administered 2022-08-04: 6 mL via INTRAVENOUS

## 2022-08-04 MED ORDER — ALBUTEROL SULFATE (2.5 MG/3ML) 0.083% IN NEBU
2.5000 mg | INHALATION_SOLUTION | RESPIRATORY_TRACT | Status: DC | PRN
  Administered 2022-08-08 – 2022-08-28 (×9): 2.5 mg via RESPIRATORY_TRACT
  Filled 2022-08-04 (×9): qty 3

## 2022-08-04 MED ORDER — DIPHENHYDRAMINE HCL 50 MG/ML IJ SOLN: 50.0000 mg | Freq: Once | INTRAMUSCULAR | Status: AC

## 2022-08-04 MED ORDER — PROPOFOL 10 MG/ML IV BOLUS
INTRAVENOUS | Status: DC | PRN
  Administered 2022-08-04: 140 mg via INTRAVENOUS

## 2022-08-04 MED ORDER — HEPARIN (PORCINE) 25000 UT/250ML-% IV SOLN
650.0000 [IU]/h | INTRAVENOUS | Status: DC
  Administered 2022-08-04: 700 [IU]/h via INTRAVENOUS
  Administered 2022-08-05: 650 [IU]/h via INTRAVENOUS
  Filled 2022-08-04 (×2): qty 250

## 2022-08-04 MED ORDER — SUGAMMADEX SODIUM 200 MG/2ML IV SOLN
INTRAVENOUS | Status: DC | PRN
  Administered 2022-08-04: 150 mg via INTRAVENOUS

## 2022-08-04 MED ORDER — LIDOCAINE HCL (CARDIAC) PF 100 MG/5ML IV SOSY
PREFILLED_SYRINGE | INTRAVENOUS | Status: DC | PRN
  Administered 2022-08-04: 40 mg via INTRATRACHEAL

## 2022-08-04 MED ORDER — FENTANYL CITRATE (PF) 250 MCG/5ML IJ SOLN
INTRAMUSCULAR | Status: DC | PRN
  Administered 2022-08-04 (×3): 50 ug via INTRAVENOUS
  Administered 2022-08-04: 100 ug via INTRAVENOUS

## 2022-08-04 MED ORDER — NICARDIPINE HCL IN NACL 20-0.86 MG/200ML-% IV SOLN: 0.0000 mg/h | INTRAVENOUS | Status: DC

## 2022-08-04 MED ORDER — SODIUM CHLORIDE 0.9 % IV SOLN: INTRAVENOUS | Status: DC

## 2022-08-04 MED ORDER — FENTANYL CITRATE PF 50 MCG/ML IJ SOSY
25.0000 ug | PREFILLED_SYRINGE | INTRAMUSCULAR | Status: DC | PRN
  Administered 2022-08-04 – 2022-08-05 (×3): 50 ug via INTRAVENOUS
  Filled 2022-08-04 (×2): qty 1

## 2022-08-04 MED ORDER — CHLORHEXIDINE GLUCONATE CLOTH 2 % EX PADS
6.0000 | MEDICATED_PAD | Freq: Every day | CUTANEOUS | Status: DC
  Administered 2022-08-04 – 2022-09-22 (×49): 6 via TOPICAL

## 2022-08-04 MED ORDER — ARFORMOTEROL TARTRATE 15 MCG/2ML IN NEBU
15.0000 ug | INHALATION_SOLUTION | Freq: Two times a day (BID) | RESPIRATORY_TRACT | Status: DC
  Administered 2022-08-04 – 2022-09-22 (×93): 15 ug via RESPIRATORY_TRACT
  Filled 2022-08-04 (×98): qty 2

## 2022-08-04 MED ORDER — FLUTICASONE PROPIONATE 50 MCG/ACT NA SUSP: 1.0000 | Freq: Every day | NASAL | Status: DC | PRN

## 2022-08-04 MED ORDER — INSULIN ASPART 100 UNIT/ML IV SOLN: 5.0000 [IU] | Freq: Once | INTRAVENOUS | Status: DC

## 2022-08-04 MED ORDER — CHLORHEXIDINE GLUCONATE 0.12 % MT SOLN: 15.0000 mL | Freq: Once | OROMUCOSAL | Status: DC

## 2022-08-04 MED ORDER — INSULIN REGULAR(HUMAN) IN NACL 100-0.9 UT/100ML-% IV SOLN
Freq: Once | INTRAVENOUS | Status: AC
  Administered 2022-08-04: 12 [IU]/h via INTRAVENOUS
  Filled 2022-08-04: qty 100

## 2022-08-04 MED ORDER — SENNOSIDES-DOCUSATE SODIUM 8.6-50 MG PO TABS
1.0000 | ORAL_TABLET | Freq: Two times a day (BID) | ORAL | Status: DC
  Administered 2022-08-04 – 2022-08-17 (×14): 1
  Filled 2022-08-04 (×14): qty 1

## 2022-08-04 MED ORDER — DOCUSATE SODIUM 50 MG/5ML PO LIQD
100.0000 mg | Freq: Two times a day (BID) | ORAL | Status: DC
  Administered 2022-08-04 – 2022-08-19 (×15): 100 mg
  Filled 2022-08-04 (×16): qty 10

## 2022-08-04 MED ORDER — VITAL HIGH PROTEIN PO LIQD
1000.0000 mL | ORAL | Status: DC
  Administered 2022-08-04: 1000 mL

## 2022-08-04 MED ORDER — CHLORHEXIDINE GLUCONATE CLOTH 2 % EX PADS
6.0000 | MEDICATED_PAD | Freq: Once | CUTANEOUS | Status: AC
  Administered 2022-08-04: 6 via TOPICAL

## 2022-08-04 MED ORDER — INSULIN REGULAR(HUMAN) IN NACL 100-0.9 UT/100ML-% IV SOLN: INTRAVENOUS | Status: DC

## 2022-08-04 MED ORDER — INSULIN ASPART 100 UNIT/ML IJ SOLN
8.0000 [IU] | INTRAMUSCULAR | Status: DC
  Administered 2022-08-04 – 2022-08-05 (×3): 8 [IU] via SUBCUTANEOUS

## 2022-08-04 MED ORDER — CEFAZOLIN SODIUM-DEXTROSE 2-4 GM/100ML-% IV SOLN: 2.0000 g | INTRAVENOUS | Status: AC

## 2022-08-04 MED ORDER — MUPIROCIN 2 % EX OINT
1.0000 | TOPICAL_OINTMENT | Freq: Two times a day (BID) | CUTANEOUS | Status: DC
  Administered 2022-08-04: 1 via NASAL
  Filled 2022-08-04: qty 22

## 2022-08-04 MED ORDER — LACTATED RINGERS IV SOLN: INTRAVENOUS | Status: DC | PRN

## 2022-08-04 MED ORDER — ALBUMIN HUMAN 5 % IV SOLN: INTRAVENOUS | Status: DC | PRN

## 2022-08-04 MED ORDER — ONDANSETRON HCL 4 MG/2ML IJ SOLN
4.0000 mg | Freq: Four times a day (QID) | INTRAMUSCULAR | Status: DC | PRN
  Administered 2022-08-20 – 2022-09-13 (×4): 4 mg via INTRAVENOUS
  Filled 2022-08-04 (×4): qty 2

## 2022-08-04 MED ORDER — HYDROMORPHONE HCL 1 MG/ML IJ SOLN
INTRAMUSCULAR | Status: AC
  Filled 2022-08-04: qty 0.5

## 2022-08-04 MED ORDER — DEXTROSE 10 % IV SOLN: INTRAVENOUS | Status: DC | PRN

## 2022-08-04 MED ORDER — NICOTINE 21 MG/24HR TD PT24
21.0000 mg | MEDICATED_PATCH | Freq: Every day | TRANSDERMAL | Status: DC
  Administered 2022-08-05: 21 mg via TRANSDERMAL
  Filled 2022-08-04: qty 1

## 2022-08-04 MED ORDER — ACETAMINOPHEN 325 MG PO TABS
650.0000 mg | ORAL_TABLET | ORAL | Status: DC | PRN
  Administered 2022-08-09 – 2022-09-18 (×29): 650 mg via ORAL
  Filled 2022-08-04 (×33): qty 2

## 2022-08-04 MED ORDER — MOMETASONE FURO-FORMOTEROL FUM 200-5 MCG/ACT IN AERO
2.0000 | INHALATION_SPRAY | Freq: Two times a day (BID) | RESPIRATORY_TRACT | Status: DC
  Filled 2022-08-04: qty 8.8

## 2022-08-04 MED ORDER — HEPARIN SODIUM (PORCINE) 1000 UNIT/ML IJ SOLN
INTRAMUSCULAR | Status: DC | PRN
  Administered 2022-08-04: 2000 [IU] via INTRAVENOUS
  Administered 2022-08-04: 5000 [IU] via INTRAVENOUS
  Administered 2022-08-04: 4000 [IU] via INTRAVENOUS

## 2022-08-04 MED ORDER — POLYETHYLENE GLYCOL 3350 17 G PO PACK
17.0000 g | PACK | Freq: Every day | ORAL | Status: DC
  Administered 2022-08-06 – 2022-08-17 (×5): 17 g
  Filled 2022-08-04 (×5): qty 1

## 2022-08-04 MED ORDER — HEPARIN SODIUM (PORCINE) 1000 UNIT/ML IJ SOLN
INTRAMUSCULAR | Status: AC
  Filled 2022-08-04: qty 10

## 2022-08-04 MED ORDER — CLEVIDIPINE BUTYRATE 0.5 MG/ML IV EMUL
INTRAVENOUS | Status: AC
  Filled 2022-08-04: qty 50

## 2022-08-04 MED ORDER — INSULIN REGULAR(HUMAN) IN NACL 100-0.9 UT/100ML-% IV SOLN
Freq: Once | INTRAVENOUS | Status: AC
  Administered 2022-08-04: 8 [IU]/h via INTRAVENOUS
  Filled 2022-08-04: qty 100

## 2022-08-04 MED ORDER — PANTOPRAZOLE SODIUM 40 MG IV SOLR
40.0000 mg | INTRAVENOUS | Status: DC
  Administered 2022-08-05 – 2022-08-19 (×15): 40 mg via INTRAVENOUS
  Filled 2022-08-04 (×15): qty 10

## 2022-08-04 MED ORDER — MIDAZOLAM BOLUS VIA INFUSION: 0.0000 mg | INTRAVENOUS | Status: DC | PRN

## 2022-08-04 MED ORDER — ALBUTEROL SULFATE (2.5 MG/3ML) 0.083% IN NEBU: 2.5000 mg | INHALATION_SOLUTION | RESPIRATORY_TRACT | Status: DC | PRN

## 2022-08-04 MED ORDER — PROTAMINE SULFATE 10 MG/ML IV SOLN
INTRAVENOUS | Status: DC | PRN
  Administered 2022-08-04: 25 mg via INTRAVENOUS

## 2022-08-04 MED ORDER — EPHEDRINE 5 MG/ML INJ
INTRAVENOUS | Status: AC
  Filled 2022-08-04: qty 5

## 2022-08-04 MED ORDER — SUCCINYLCHOLINE CHLORIDE 200 MG/10ML IV SOSY
PREFILLED_SYRINGE | INTRAVENOUS | Status: AC
  Filled 2022-08-04: qty 10

## 2022-08-04 MED ORDER — FENTANYL CITRATE (PF) 250 MCG/5ML IJ SOLN
INTRAMUSCULAR | Status: AC
  Filled 2022-08-04: qty 5

## 2022-08-04 MED ORDER — INSULIN ASPART 100 UNIT/ML IJ SOLN
2.0000 [IU] | INTRAMUSCULAR | Status: DC
  Administered 2022-08-04 (×2): 4 [IU] via SUBCUTANEOUS
  Administered 2022-08-05: 6 [IU] via SUBCUTANEOUS

## 2022-08-04 MED ORDER — CLEVIDIPINE BUTYRATE 0.5 MG/ML IV EMUL
0.0000 mg/h | INTRAVENOUS | Status: DC
  Administered 2022-08-04 (×2): 10 mg/h via INTRAVENOUS
  Administered 2022-08-04: 14 mg/h via INTRAVENOUS
  Administered 2022-08-04: 25 mg/h via INTRAVENOUS
  Administered 2022-08-04: 12 mg/h via INTRAVENOUS
  Administered 2022-08-04: 18 mg/h via INTRAVENOUS
  Administered 2022-08-04: 22 mg/h via INTRAVENOUS
  Administered 2022-08-04: 12 mg/h via INTRAVENOUS
  Administered 2022-08-04: 27 mg/h via INTRAVENOUS
  Administered 2022-08-05: 23 mg/h via INTRAVENOUS
  Administered 2022-08-05 (×2): 32 mg/h via INTRAVENOUS
  Administered 2022-08-05: 28 mg/h via INTRAVENOUS
  Filled 2022-08-04 (×5): qty 100
  Filled 2022-08-04 (×2): qty 50
  Filled 2022-08-04: qty 100
  Filled 2022-08-04: qty 50
  Filled 2022-08-04 (×3): qty 100

## 2022-08-04 MED ORDER — ACETAMINOPHEN 650 MG RE SUPP: 650.0000 mg | RECTAL | Status: DC | PRN

## 2022-08-04 MED ORDER — CHLORHEXIDINE GLUCONATE CLOTH 2 % EX PADS: 6.0000 | MEDICATED_PAD | Freq: Once | CUTANEOUS | Status: AC

## 2022-08-04 MED ORDER — ESCITALOPRAM OXALATE 10 MG PO TABS: 20.0000 mg | ORAL_TABLET | Freq: Every day | ORAL | Status: DC

## 2022-08-04 MED ORDER — PHENYLEPHRINE 80 MCG/ML (10ML) SYRINGE FOR IV PUSH (FOR BLOOD PRESSURE SUPPORT)
PREFILLED_SYRINGE | INTRAVENOUS | Status: AC
  Filled 2022-08-04: qty 10

## 2022-08-04 MED ORDER — FAMOTIDINE 20 MG PO TABS: 20.0000 mg | ORAL_TABLET | Freq: Two times a day (BID) | ORAL | Status: DC

## 2022-08-04 MED ORDER — REVEFENACIN 175 MCG/3ML IN SOLN
175.0000 ug | Freq: Every day | RESPIRATORY_TRACT | Status: DC
  Administered 2022-08-04 – 2022-09-22 (×46): 175 ug via RESPIRATORY_TRACT
  Filled 2022-08-04 (×49): qty 3

## 2022-08-04 MED ORDER — ATORVASTATIN CALCIUM 80 MG PO TABS: 80.0000 mg | ORAL_TABLET | Freq: Every day | ORAL | Status: DC

## 2022-08-04 MED ORDER — CEFAZOLIN SODIUM-DEXTROSE 2-3 GM-%(50ML) IV SOLR
INTRAVENOUS | Status: DC | PRN
  Administered 2022-08-04: 2 g via INTRAVENOUS

## 2022-08-04 MED ORDER — PROPOFOL 10 MG/ML IV BOLUS
INTRAVENOUS | Status: AC
  Filled 2022-08-04: qty 20

## 2022-08-04 MED ORDER — ADULT MULTIVITAMIN W/MINERALS CH
1.0000 | ORAL_TABLET | Freq: Every day | ORAL | Status: DC
  Administered 2022-08-05 – 2022-09-08 (×33): 1
  Filled 2022-08-04 (×34): qty 1

## 2022-08-04 MED ORDER — METOPROLOL TARTRATE 5 MG/5ML IV SOLN
INTRAVENOUS | Status: DC | PRN
  Administered 2022-08-04: 2.5 mg via INTRAVENOUS

## 2022-08-04 MED ORDER — HYDROMORPHONE HCL 1 MG/ML IJ SOLN
1.0000 mg | Freq: Once | INTRAMUSCULAR | Status: AC | PRN
  Administered 2022-08-07: 1 mg via INTRAVENOUS
  Filled 2022-08-04: qty 1

## 2022-08-04 MED ORDER — PHENYLEPHRINE HCL (PRESSORS) 10 MG/ML IV SOLN
INTRAVENOUS | Status: DC | PRN
  Administered 2022-08-04: 40 ug via INTRAVENOUS

## 2022-08-04 MED ORDER — HEPARIN 6000 UNIT IRRIGATION SOLUTION
Status: AC
  Filled 2022-08-04: qty 500

## 2022-08-04 MED ORDER — IODIXANOL 320 MG/ML IV SOLN
INTRAVENOUS | Status: DC | PRN
  Administered 2022-08-04: 65 mL via INTRA_ARTERIAL

## 2022-08-04 MED ORDER — PREGABALIN 25 MG PO CAPS: 50.0000 mg | ORAL_CAPSULE | Freq: Three times a day (TID) | ORAL | Status: DC

## 2022-08-04 MED ORDER — 0.9 % SODIUM CHLORIDE (POUR BTL) OPTIME
TOPICAL | Status: DC | PRN
  Administered 2022-08-04: 2500 mL

## 2022-08-04 MED ORDER — INSULIN ASPART 100 UNIT/ML IJ SOLN: 5.0000 [IU] | Freq: Once | INTRAMUSCULAR | Status: DC

## 2022-08-04 MED ORDER — FENTANYL CITRATE PF 50 MCG/ML IJ SOSY
25.0000 ug | PREFILLED_SYRINGE | INTRAMUSCULAR | Status: DC | PRN
  Administered 2022-08-05 (×2): 50 ug via INTRAVENOUS
  Filled 2022-08-04 (×3): qty 1

## 2022-08-04 MED ORDER — HYDROMORPHONE HCL 1 MG/ML IJ SOLN
INTRAMUSCULAR | Status: DC | PRN
  Administered 2022-08-04: .5 mg via INTRAVENOUS

## 2022-08-04 MED ORDER — HEPARIN 6000 UNIT IRRIGATION SOLUTION
Status: DC | PRN
  Administered 2022-08-04: 1

## 2022-08-04 MED ORDER — MIDAZOLAM HCL 5 MG/5ML IJ SOLN
INTRAMUSCULAR | Status: DC | PRN
  Administered 2022-08-04 (×2): 1 mg via INTRAVENOUS

## 2022-08-04 MED ORDER — PROPOFOL 1000 MG/100ML IV EMUL
0.0000 ug/kg/min | INTRAVENOUS | Status: DC
  Administered 2022-08-04: 24.986 ug/kg/min via INTRAVENOUS
  Administered 2022-08-04: 5 ug/kg/min via INTRAVENOUS
  Administered 2022-08-05: 20 ug/kg/min via INTRAVENOUS
  Filled 2022-08-04 (×3): qty 100

## 2022-08-04 MED ORDER — DIPHENHYDRAMINE HCL 50 MG/ML IJ SOLN
INTRAMUSCULAR | Status: AC
  Administered 2022-08-04: 50 mg via INTRAVENOUS
  Filled 2022-08-04: qty 1

## 2022-08-04 MED ORDER — SUCCINYLCHOLINE CHLORIDE 200 MG/10ML IV SOSY
PREFILLED_SYRINGE | INTRAVENOUS | Status: DC | PRN
  Administered 2022-08-04: 120 mg via INTRAVENOUS

## 2022-08-04 MED ORDER — ADULT MULTIVITAMIN W/MINERALS CH: 1.0000 | ORAL_TABLET | Freq: Every day | ORAL | Status: DC

## 2022-08-04 SURGICAL SUPPLY — 77 items
ADH SKN CLS APL DERMABOND .7 (GAUZE/BANDAGES/DRESSINGS) ×8
APL PRP STRL LF DISP 70% ISPRP (MISCELLANEOUS) ×4
BAG COUNTER SPONGE SURGICOUNT (BAG) ×4 IMPLANT
BAG SNAP BAND KOVER 36X36 (MISCELLANEOUS) ×4 IMPLANT
BAG SPNG CNTER NS LX DISP (BAG) ×4
BALLN MUSTANG 7X60X75 (BALLOONS) ×4
BALLN MUSTANG 7X80X75 (BALLOONS) ×4
BALLOON MUSTANG 7X60X75 (BALLOONS) IMPLANT
BALLOON MUSTANG 7X80X75 (BALLOONS) IMPLANT
BNDG ELASTIC 4X5.8 VLCR STR LF (GAUZE/BANDAGES/DRESSINGS) IMPLANT
CANISTER SUCT 3000ML PPV (MISCELLANEOUS) ×4 IMPLANT
CATH BEACON 5 .035 65 KMP TIP (CATHETERS) IMPLANT
CATH EMB 3FR 80CM (CATHETERS) IMPLANT
CATH EMB 4FR 40CM (CATHETERS) IMPLANT
CHLORAPREP W/TINT 26 (MISCELLANEOUS) ×4 IMPLANT
CLIP LIGATING EXTRA MED SLVR (CLIP) ×4 IMPLANT
CLIP LIGATING EXTRA SM BLUE (MISCELLANEOUS) ×4 IMPLANT
COVER DOME SNAP 22 D (MISCELLANEOUS) ×4 IMPLANT
COVER PROBE W GEL 5X96 (DRAPES) ×4 IMPLANT
COVER SURGICAL LIGHT HANDLE (MISCELLANEOUS) ×4 IMPLANT
DERMABOND ADVANCED .7 DNX12 (GAUZE/BANDAGES/DRESSINGS) ×8 IMPLANT
DRAPE HALF SHEET 40X57 (DRAPES) IMPLANT
DRSG COVADERM 4X6 (GAUZE/BANDAGES/DRESSINGS) IMPLANT
DRSG COVADERM 4X8 (GAUZE/BANDAGES/DRESSINGS) IMPLANT
ELECT REM PT RETURN 9FT ADLT (ELECTROSURGICAL) ×4
ELECTRODE REM PT RTRN 9FT ADLT (ELECTROSURGICAL) ×4 IMPLANT
GAUZE 4X4 16PLY ~~LOC~~+RFID DBL (SPONGE) ×4 IMPLANT
GLIDEWIRE ADV .035X180CM (WIRE) IMPLANT
GLOVE BIO SURGEON STRL SZ7.5 (GLOVE) ×4 IMPLANT
GOWN STRL REUS W/ TWL LRG LVL3 (GOWN DISPOSABLE) ×8 IMPLANT
GOWN STRL REUS W/ TWL XL LVL3 (GOWN DISPOSABLE) ×4 IMPLANT
GOWN STRL REUS W/TWL LRG LVL3 (GOWN DISPOSABLE) ×8
GOWN STRL REUS W/TWL XL LVL3 (GOWN DISPOSABLE) ×4
GRAFT VASC PATCH XENOSURE 1X14 (Vascular Products) IMPLANT
GUIDEWIRE ANGLED .035X150CM (WIRE) IMPLANT
KIT BASIN OR (CUSTOM PROCEDURE TRAY) ×4 IMPLANT
KIT ENCORE 26 ADVANTAGE (KITS) IMPLANT
KIT TURNOVER KIT B (KITS) ×4 IMPLANT
NS IRRIG 1000ML POUR BTL (IV SOLUTION) ×8 IMPLANT
PACK BASIC III (CUSTOM PROCEDURE TRAY) ×4
PACK PERIPHERAL VASCULAR (CUSTOM PROCEDURE TRAY) ×4 IMPLANT
PACK SRG BSC III STRL LF ECLPS (CUSTOM PROCEDURE TRAY) ×4 IMPLANT
PAD ARMBOARD 7.5X6 YLW CONV (MISCELLANEOUS) ×8 IMPLANT
POWDER SURGICEL 3.0 GRAM (HEMOSTASIS) IMPLANT
SET COLLECT BLD 21X3/4 12 (NEEDLE) IMPLANT
SET MICROPUNCTURE 5F STIFF (MISCELLANEOUS) ×4 IMPLANT
SHEATH AVANTI 11CM 5FR (SHEATH) IMPLANT
SHEATH BRITE TIP 7FRX11 (SHEATH) IMPLANT
SHEATH PINNACLE 5F 10CM (SHEATH) IMPLANT
STAPLER VISISTAT (STAPLE) IMPLANT
STAPLER VISISTAT 35W (STAPLE) IMPLANT
STENT INNOVA 7X100X130 (Permanent Stent) IMPLANT
STENT INNOVA 7X60X130 (Permanent Stent) IMPLANT
STENT VIABAHN VBX 7X59X135 (Permanent Stent) IMPLANT
STENT VIABAHN VBX 7X59X80 (Permanent Stent) IMPLANT
STOPCOCK 4 WAY LG BORE MALE ST (IV SETS) IMPLANT
SUT MNCRL AB 4-0 PS2 18 (SUTURE) ×8 IMPLANT
SUT PROLENE 5 0 C 1 24 (SUTURE) IMPLANT
SUT PROLENE 6 0 BV (SUTURE) IMPLANT
SUT SILK 3 0 (SUTURE)
SUT SILK 3-0 18XBRD TIE 12 (SUTURE) IMPLANT
SUT VIC AB 2-0 CT1 27 (SUTURE) ×8
SUT VIC AB 2-0 CT1 TAPERPNT 27 (SUTURE) ×8 IMPLANT
SUT VIC AB 3-0 SH 27 (SUTURE) ×8
SUT VIC AB 3-0 SH 27X BRD (SUTURE) ×8 IMPLANT
SYR 10ML LL (SYRINGE) IMPLANT
SYR 20ML LL LF (SYRINGE) IMPLANT
SYR 3ML LL SCALE MARK (SYRINGE) IMPLANT
TOWEL GREEN STERILE (TOWEL DISPOSABLE) ×8 IMPLANT
TRAY FOLEY MTR SLVR 16FR STAT (SET/KITS/TRAYS/PACK) IMPLANT
TUBING EXTENTION W/L.L. (IV SETS) IMPLANT
TUBING HIGH PRESSURE 120CM (CONNECTOR) IMPLANT
UNDERPAD 30X36 HEAVY ABSORB (UNDERPADS AND DIAPERS) ×4 IMPLANT
WATER STERILE IRR 1000ML POUR (IV SOLUTION) ×4 IMPLANT
WIRE BENTSON .035X145CM (WIRE) ×4 IMPLANT
WIRE ROSEN-J .035X260CM (WIRE) IMPLANT
WIRE TORQFLEX AUST .018X40CM (WIRE) IMPLANT

## 2022-08-04 NOTE — Anesthesia Procedure Notes (Signed)
Arterial Line Insertion Start/End12/06/2022 5:05 AM, 08/04/2022 5:11 AM Performed by: Effie Berkshire, MD, anesthesiologist  Patient location: Pre-op. Preanesthetic checklist: patient identified, IV checked, site marked, risks and benefits discussed, surgical consent, monitors and equipment checked, pre-op evaluation, timeout performed and anesthesia consent Lidocaine 1% used for infiltration Left, brachial was placed Catheter size: 20 G Hand hygiene performed  and maximum sterile barriers used   Attempts: 1 Procedure performed without using ultrasound guided technique. Following insertion, dressing applied and Biopatch. Post procedure assessment: normal and unchanged  Post procedure complications: second provider assisted. Patient tolerated the procedure well with no immediate complications.

## 2022-08-04 NOTE — Progress Notes (Signed)
PT Cancellation Note  Patient Details Name: Heather [name] MRN: [id number] DOB: 08/11/1951   Cancelled Treatment:    Reason Eval/Treat Not Completed: Patient at procedure or test/unavailable.  Pt has been is procedures much of the day.  Will see 12/12 as able and appropriate. 08/04/2022  Ginger Carne., PT Acute Rehabilitation Services [phone]  (office)   [name] 08/04/2022, 4:28 PM

## 2022-08-04 NOTE — Progress Notes (Addendum)
ANTICOAGULATION CONSULT NOTE - Initial Consult  Pharmacy Consult for Heparin Indication: Critical limb ischemia  Allergies  Allergen Reactions   Iodinated Contrast Media Hives   Sulfa Antibiotics Hives   Shellfish Allergy Rash and Other (See Comments)    Scallops specifically cause VOMITING      Patient Measurements: Height: 5' (152.4 cm) IBW/kg (Calculated) : 45.5 Heparin Dosing Weight: 57 kg  Vital Signs: Temp: 97.8 F (36.6 C) (12/11 0400) Temp Source: Oral (12/11 0400) BP: 139/51 (12/11 0345) Pulse Rate: 116 (12/11 0345)  Labs: Recent Labs    08/03/22 2156 08/04/22 0322  HGB 14.1  --   HCT 43.1  --   PLT 375  --   LABPROT 12.6  --   INR 1.0  --   CREATININE 1.26* 1.13*  TROPONINIHS 15  --     Estimated Creatinine Clearance: 37.7 mL/min (A) (by C-G formula based on SCr of 1.13 mg/dL (H)).   Medical History: Past Medical History:  Diagnosis Date   (HFpEF) heart failure with preserved ejection fraction (HCC)    Angina pectoris (HCC)    Anxiety    Aortic atherosclerosis (HCC)    Basal cell carcinoma    Bilateral carotid artery disease (HCC)    a.) carotid doppler 03/18/2021: 2-50% RICA, 53-97% LICA   Bipolar disorder (HCC)    Cervicalgia    Chronic mesenteric ischemia (HCC)    a.) s/p PTA 11/13/2020 --> 6 x 16 mm lifestream ballon expanding stent to SMA   COPD (chronic obstructive pulmonary disease) (HCC)    Coronary artery disease 03/20/2014   a.) LHC/PCI 03/20/2014: 70% mLAD (2.5 x 28 mm Xience Alpine DES), 70% dLCx, 70% dRCA; b.) LHC 01/25/2016: 90% ISR mLAD --> mLAD dissection with in stent --> 2.75 x 22 mm Resolute DES; c.) R/LHC 09/29/2016: 50% ISR mLAD, 70% dLAD, mPA 37, PCWP 24, AO sat 90%, PA sat 46%, CO 3.7, CI 2.46 - med mgmt   DDD (degenerative disc disease), cervical    Depression    Diabetic polyneuropathy (HCC)    GERD (gastroesophageal reflux disease)    Hyperlipidemia    Hypertension    Long term current use of  antithrombotics/antiplatelets    a.) on DAPT (ASA + clopidogrel)   Memory loss    NSTEMI (non-ST elevated myocardial infarction) ([place]) 07/2016   a.) troponins trended (normal < 0.034 ng/mL): 0.325 --> 1.480 --> 2.330 --> 1.660 --> 0.169 ng/mL   PAD (peripheral artery disease) ([place])    a.) s/p PTA and stenting SMA 11/13/2020; b.) s/p PTA and stenting of RIGHT SFA and popliteal 04/12/2021; c.) s/p PTA and kissing balloon stents to BILATERAL CIAs 05/28/2021   Respiratory failure with hypoxia (HCC)    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 Past medical history: HFpEF, CAD s/p PCI on DAPT, severe AI/AS s/p TAVR, COPD, PAD, HTN. No anticoagulation PTA.   Despite high risk of worsening hemorrhage, CT shows stable ICH; thus, using neuro scale heparin with goal 0.3-0.5 with no bolusing per neurology.   Goal of Therapy:  Heparin level 0.3-0.5 units/mL Monitor platelets by  anticoagulation protocol: Yes   Plan:  Start heparin infusion at 700 units/hr with no bolus. Check heparin level 6 hr after start of infusion.  Monitor daily CBC and s/sx of bleeding.   Heather [name], PharmD Candidate 08/04/2022,10:32 AM

## 2022-08-04 NOTE — Progress Notes (Signed)
RT NOTE: RT transported patient on ventilator from room 4N16 to MRI and back to room 4N16 with no apparent complications.

## 2022-08-04 NOTE — Anesthesia Procedure Notes (Signed)
Procedure Name: Intubation Date/Time: 08/04/2022 6:09 AM  Performed by: Clovis Cao, CRNAPre-anesthesia Checklist: Patient identified, Emergency Drugs available, Suction available and Patient being monitored Patient Re-evaluated:Patient Re-evaluated prior to induction Oxygen Delivery Method: Circle system utilized Preoxygenation: Pre-oxygenation with 100% oxygen Induction Type: IV induction and Rapid sequence Laryngoscope Size: Miller and 2 Grade View: Grade I Tube type: Oral Tube size: 7.5 mm Number of attempts: 1 Airway Equipment and Method: Stylet Placement Confirmation: ETT inserted through vocal cords under direct vision, positive ETCO2 and breath sounds checked- equal and bilateral Secured at: 22 cm Tube secured with: Tape Dental Injury: Teeth and Oropharynx as per pre-operative assessment

## 2022-08-04 NOTE — Consult Note (Signed)
NAME:  Heather [name], MRN:  [id number], DOB:  30-Apr-1951, LOS: 0 ADMISSION DATE:  08/04/2022, CONSULTATION DATE:  08/04/2022 REFERRING MD:  Stroke team, CHIEF COMPLAINT:  Headache  History of Present Illness:  71 yo female smoker presented to ER with sudden onset of dizziness, blurred vision, headache, nausea and vomiting.  BP in EMS 209/76.  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  Also found to have bilateral leg pain with numbness and coolness.  Found to have occlusion of b/l common iliac artery stents, Lt SFA and popliteal stent and vascular surgery consulted.  Taken to OR for b/l common femoral endarterectomy, iliofemoral endarterectomy, lower extremity embolectomy and stent of b/l common iliac arteries and Rt external iliac artery, and b/l 4 compartment fasciotomies.  Remained on vent and cleviprex post op.  PCCM consulted to assist with critical care management.  Pertinent  Medical History  HFpEF, Anxiety, PAD, Bipolar, COPD, CAD, Depression, DM with neuropathy, GERD, HLD. HTN, s/p TAVR, Schizophrenia, Vertigo  Significant Hospital Events: Including procedures, antibiotic start and stop dates in addition to other pertinent events   12/11 admit, vascular surgery consulted >> to OR  Studies:  CT head 12/10 >> acute ICH 11 cc volume in cerebellar vermis  Interim History / Subjective:  Arrived to ICU from OR.  Objective   Blood pressure (!) 139/51, pulse (!) 116, temperature 97.8 F (36.6 C), temperature source Oral, resp. rate 13, height 5' (1.524 m), SpO2 98 %.        Intake/Output Summary (Last 24 hours) at 08/04/2022 0958 Last data filed at 08/04/2022 0947 Gross per 24 hour  Intake 1568.57 ml  Output 1710 ml  Net -141.43 ml   There were no vitals filed for this visit.  Examination:  General - sedated Eyes - pupils reactive ENT - ETT in place Cardiac - regular rate/rhythm, no murmur Chest - equal breath sounds b/l, no wheezing or  rales Abdomen - soft, non tender, + bowel sounds Extremities - Lt foot cool Skin - no rashes Neuro - RASS -2  Resolved Hospital Problem list     Assessment & Plan:   Critical limb ischemia s/p b/l common femoral endarterectomy, iliofemoral endarterectomy, lower extremity embolectomy and stent of b/l common iliac arteries and Rt external iliac artery, and b/l 4 compartment fasciotomies. - post op care per vascular surgery - might need amputation of Lt lower leg - continue heparin gtt for now  ICH of cerebellar vermis. Sedation needs while on vent. Hx of depression, bipolar, schizophrenia, insomnia. - per neurology - f/u MRI brain - diprivan with prn versed/fentanyl for RASS goal -1 - hold outpt lexapro, trazadone  HTN emergency. - cleviprex for goal SBP 130 to 150  Compromised airway. Hx of COPD with tobacco abuse. - full vent support - goal SpO2 > 92% - f/u CXR - yupelri, pulmicort, brovana with prn albuterol - nicotine patch  DM type 2 poorly controlled with hyperglycemia. DM neuropathy. - insulin gtt - hold outpt lyriva, metformin  Hx of CAD, chronic HFpEF, HLD, HTN, s/p TAVR. - lipitor - f/u Echo - hold outpt lasix, ASA, plavix, cozaar, toprol  D/w Dr. Erlinda Hong.  Best Practice (right click and "Reselect all SmartList Selections" daily)   Diet/type: NPO DVT prophylaxis: systemic heparin GI prophylaxis: H2B Lines: Central line Foley:  Yes, and it is still needed Code Status:  full code Last date of multidisciplinary goals of care discussion '[x]'$   Labs   CBC: Recent Labs  Lab 08/03/22 2156  WBC 13.6*  HGB 14.1  HCT 43.1  MCV 89.6  PLT 440    Basic Metabolic Panel: Recent Labs  Lab 08/03/22 2156 08/04/22 0322  NA 135 136  K 3.3* 4.3  CL 100 101  CO2 21* 17*  GLUCOSE 454* 589*  BUN 37* 30*  CREATININE 1.26* 1.13*  CALCIUM 9.6 9.1   GFR: Estimated Creatinine Clearance: 37.7 mL/min (A) (by C-G formula based on SCr of 1.13 mg/dL (H)). Recent  Labs  Lab 08/03/22 2156  WBC 13.6*    Liver Function Tests: Recent Labs  Lab 08/04/22 0322  AST 20  ALT 14  ALKPHOS 113  BILITOT 1.1  PROT 6.9  ALBUMIN 3.8   No results for input(s): "LIPASE", "AMYLASE" in the last 168 hours. No results for input(s): "AMMONIA" in the last 168 hours.  ABG No results found for: "PHART", "PCO2ART", "PO2ART", "HCO3", "TCO2", "ACIDBASEDEF", "O2SAT"   Coagulation Profile: Recent Labs  Lab 08/03/22 2156  INR 1.0    Cardiac Enzymes: No results for input(s): "CKTOTAL", "CKMB", "CKMBINDEX", "TROPONINI" in the last 168 hours.  HbA1C: Hgb A1c MFr Bld  Date/Time Value Ref Range Status  07/02/2022 12:11 PM 12.0 (H) 4.8 - 5.6 % Final    Comment:    (NOTE) Pre diabetes:          5.7%-6.4%  Diabetes:              >6.4%  Glycemic control for   <7.0% adults with diabetes   11/10/2020 04:35 AM 8.2 (H) 4.8 - 5.6 % Final    Comment:    (NOTE) Pre diabetes:          5.7%-6.4%  Diabetes:              >6.4%  Glycemic control for   <7.0% adults with diabetes     CBG: Recent Labs  Lab 08/03/22 2153 08/04/22 0308 08/04/22 0738 08/04/22 0825  GLUCAP 412* 532* 418* 392*    Review of Systems:   Unable to obtain.  Past Medical History:  She,  has a past medical history of (HFpEF) heart failure with preserved ejection fraction ([place]), Angina pectoris ([place]), Anxiety, Aortic atherosclerosis ([place]), Basal cell carcinoma, Bilateral carotid artery disease ([place]), Bipolar disorder ([place]), Cervicalgia, Chronic mesenteric ischemia ([place]), COPD (chronic obstructive pulmonary disease) ([place]), Coronary artery disease (03/20/2014), DDD (degenerative disc disease), cervical, Depression, Diabetic polyneuropathy ([place]), GERD (gastroesophageal reflux disease), Hyperlipidemia, Hypertension, Long term current use of antithrombotics/antiplatelets, Memory loss, NSTEMI (non-ST elevated myocardial infarction) ([place]) (07/2016), PAD (peripheral artery disease) ([place]),  Respiratory failure with hypoxia ([place]), 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   Surgical History:   Past Surgical History:  Procedure Laterality Date   AORTIC VALVE REPLACEMENT  09/30/2016   Procedure: TRANSCATHETER AORTIC VALVE REPAIR   BLADDER SURGERY     CATARACT EXTRACTION, BILATERAL     CHOLECYSTECTOMY     COLONOSCOPY     CORONARY ANGIOPLASTY WITH STENT PLACEMENT  03/20/2014   Procedure: CORONARY ANGIOPLASTY WITH STENT PLACEMENT; Location: [facility]; Surgeon: Jacques Earthly, MD   CORONARY ANGIOPLASTY WITH STENT PLACEMENT Left 01/25/2016   Procedure: CORONARY ANGIOPLASTY WITH STENT PLACEMENT; Location: [facility]; Surgeon: Jacques Earthly, MD   FOOT SURGERY Right    HEMORRHOID SURGERY     IRRIGATION AND DEBRIDEMENT HEMATOMA Left 07/08/2014   GROIN   LOWER EXTREMITY ANGIOGRAPHY Right 04/12/2021   Procedure: Lower Extremity Angiography;  Surgeon: [name], MD;  Location: [facility];  Service: Cardiovascular;  Laterality: Right;   LOWER EXTREMITY ANGIOGRAPHY Left 05/28/2021   Procedure: LOWER EXTREMITY ANGIOGRAPHY;  Surgeon: [name], MD;  Location: [facility];  Service: Cardiovascular;  Laterality: Left;   LOWER EXTREMITY ANGIOGRAPHY Left 03/25/2022   Procedure: Lower Extremity Angiography;  Surgeon: [name], MD;  Location: [facility];  Service: Cardiovascular;  Laterality: Left;   PSEUDOANEURYSM REPAIR Left 06/30/2014   FEMORAL ARTERY   RIGHT/LEFT HEART CATH AND CORONARY ANGIOGRAPHY Bilateral 09/29/2016   Procedure: RIGHT/LEFT HEART CATH AND CORONARY ANGIOGRAPHY; Location [facility]; Surgeon: Jacques Earthly, MD   TUBAL LIGATION     VISCERAL ANGIOGRAPHY N/A 11/13/2020   Procedure: VISCERAL ANGIOGRAPHY;  Surgeon: [name], MD;  Location: [place] [facility];  Service: Cardiovascular;  Laterality:  N/A;     Social History:   reports that she has been smoking cigarettes. She has a 72.00 pack-year smoking history. She has never used smokeless tobacco. She reports that she does not currently use alcohol. She reports that she does not currently use drugs.   Family History:  Her family history includes Mental illness in her mother. There is no history of Breast cancer.   Allergies Allergies  Allergen Reactions   Iodinated Contrast Media Hives   Sulfa Antibiotics Hives   Shellfish Allergy Rash and Other (See Comments)    Scallops specifically cause VOMITING Scallops specifically cause VOMITING      Home Medications  Prior to Admission medications   Medication Sig Start Date End Date Taking? Authorizing Provider  cetirizine (ZYRTEC) 10 MG tablet Take 10 mg by mouth daily. 07/07/22  Yes [provider]  albuterol (VENTOLIN HFA) 108 (90 Base) MCG/ACT inhaler Inhale 2 puffs into the lungs every 4 (four) hours as needed for wheezing or shortness of breath. 03/09/14   [provider]  aspirin EC 81 MG tablet Take 81 mg by mouth daily.    [provider]  atorvastatin (LIPITOR) 80 MG tablet Take 80 mg by mouth at bedtime. 08/28/18   [provider]  cholecalciferol (VITAMIN D) 1000 units tablet Take 1,000 Units by mouth daily.    [provider]  clopidogrel (PLAVIX) 75 MG tablet Take 1 tablet (75 mg total) by mouth daily. 11/15/20   Regalado, Belkys A, MD  diphenhydrAMINE (BENADRYL) 25 mg capsule Take 25 mg by mouth every 8 (eight) hours as needed.    [provider]  escitalopram (LEXAPRO) 20 MG tablet Take 1 tablet (20 mg total) by mouth daily. 01/16/20   Ursula Alert, MD  famotidine (PEPCID) 20 MG tablet Take 20 mg by mouth 2 (two) times daily as needed for heartburn or indigestion.    [provider]  fluticasone (FLONASE) 50 MCG/ACT nasal spray Place 1 spray into both nostrils daily as needed for allergies. 11/02/16    [provider]  Fluticasone-Salmeterol (ADVAIR) 250-50 MCG/DOSE AEPB Inhale 1 puff into the lungs daily. 10/02/16   [provider]  furosemide (LASIX) 20 MG tablet Take 20 mg by mouth daily. 08/28/18   [provider]  Insulin Syringe-Needle U-100 30G X 5/16" 1 ML MISC  02/19/16   [provider]  LEVEMIR FLEXTOUCH 100 UNIT/ML FlexPen Inject 18 Units into the skin at bedtime. 11/22/20   [provider]  losartan (COZAAR) 50 MG tablet Take 50 mg by mouth daily. 09/07/20   [provider]  meclizine (ANTIVERT) 25 MG  tablet Take 1 tablet (25 mg total) by mouth 3 (three) times daily as needed for dizziness. 04/05/22   Naaman Plummer, MD  metFORMIN (GLUCOPHAGE-XR) 750 MG 24 hr tablet Take 750 mg by mouth daily. 05/19/22   [provider]  metoprolol succinate (TOPROL-XL) 25 MG 24 hr tablet Take 25 mg by mouth daily. 10/02/18   [provider]  Multiple Vitamins-Minerals (WOMENS MULTI VITAMIN & MINERAL PO) Take 1 tablet by mouth every morning.    [provider]  nitroGLYCERIN (NITROSTAT) 0.4 MG SL tablet Place 1 tablet (0.4 mg total) under the tongue every 5 (five) minutes as needed for chest pain. 06/09/22   Gollan, Kathlene November, MD  NOVOLOG FLEXPEN 100 UNIT/ML FlexPen Inject 5 Units into the skin 3 (three) times daily with meals. 04/07/22   [provider]  pregabalin (LYRICA) 50 MG capsule Take 50 mg by mouth 3 (three) times daily.    [provider]  traMADol (ULTRAM) 50 MG tablet Take 1 tablet (50 mg total) by mouth every 6 (six) hours as needed. 03/19/22   Kris Hartmann, NP  traZODone (DESYREL) 50 MG tablet TAKE 1/2 TO 1 TABLET AT BEDTIME AS NEEDED FOR SLEEP Patient taking differently: Take 25 mg by mouth at bedtime as needed for sleep. 03/06/21   Ursula Alert, MD     Critical care time: 44 minutes  Chesley Mires, MD [place] Pager - [phone] or [phone] 08/04/2022,  10:29 AM

## 2022-08-04 NOTE — Progress Notes (Signed)
eLink Physician-Brief Progress Note Patient Name: Heather [name] DOB: July 31, 1951 MRN: [id number]   Date of Service  08/04/2022  HPI/Events of Note  Nursing feels that patient has ischemic leg pain and is not on any PRN pain medications.   eICU Interventions  Plan: Fentanyl 25-50 mcg IV Q 2 hours PRN moderate or severe pain.      Intervention Category Major Interventions: Other:;Hypertension - evaluation and management  Lysle Dingwall 08/04/2022, 10:48 PM

## 2022-08-04 NOTE — Transfer of Care (Signed)
Immediate Anesthesia Transfer of Care Note  Patient: Heather [name]  Procedure(s) Performed: BILATERAL LOWER EXTREMITY THROMBECTOMY (Bilateral: Groin) LOWER EXTREMITY ANGIOGRAM (Bilateral) BILATERAL FEMORAL ENDARTERECTOMY (Bilateral: Groin) LEFT FEMORAL PATCH ANGIOPLASTY USING XENOSURE BIOLOGIC PATCH (Groin) INSERTION OF BILATERAL ILIAC STENTS (Bilateral: Groin) BILATERAL LOWER EXTREMITY FASCIECTOMIES (Bilateral: Leg Lower)  Patient Location: SICU  Anesthesia Type:General  Level of Consciousness: responds to stimulation and Patient remains intubated per anesthesia plan  Airway & Oxygen Therapy: Patient Spontanous Breathing, Patient connected to T-piece oxygen, and Patient placed on Ventilator (see vital sign flow sheet for setting)  Post-op Assessment: Report given to RN and Post -op Vital signs reviewed and stable  Post vital signs: Reviewed and stable  Last Vitals:  Vitals Value Taken Time  BP 135/77   Temp    Pulse 91 08/04/22 0957  Resp 16 08/04/22 0957  SpO2 100 % 08/04/22 0957  Vitals shown include unvalidated device data.  Last Pain:  Vitals:   08/04/22 0400  TempSrc: Oral         Complications: No notable events documented.

## 2022-08-04 NOTE — Procedures (Signed)
Cortrak  Person Inserting Tube:  Alroy Dust, Khadija Thier L, RD Tube Type:  Cortrak - 43 inches Tube Size:  10 Tube Location:  Left nare Secured by: Bridle Technique Used to Measure Tube Placement:  Marking at nare/corner of mouth Cortrak Secured At:  58 cm   Cortrak Tube Team Note:  Consult received to place a Cortrak feeding tube.   X-ray is required, abdominal x-ray has been ordered by the Cortrak team. Please confirm tube placement before using the Cortrak tube.   If the tube becomes dislodged please keep the tube and contact the Cortrak team at [email or web address] for replacement.  If after hours and replacement cannot be delayed, place a NG tube and confirm placement with an abdominal x-ray.    Hermina Barters RD, LDN Clinical Dietitian See Shea Evans for contact information.

## 2022-08-04 NOTE — Progress Notes (Signed)
eLink Physician-Brief Progress Note Patient Name: Heather [name] DOB: 12-Aug-1951 MRN: [id number]   Date of Service  08/04/2022  HPI/Events of Note  EndoTool prompting to transition off of insulin IV infusion based on infusion rate of 5 units per hour.  Patient on tube feeds.   eICU Interventions  Plan: Transition to Q 4 hour standard Novolog SSI + Q 4 hour Novolog 8 units [place]  tube feed coverage + Levemir 24 units [place] Q 12 hours.      Intervention Category Major Interventions: Hyperglycemia - active titration of insulin therapy  Lysle Dingwall 08/04/2022, 8:03 PM

## 2022-08-04 NOTE — Anesthesia Procedure Notes (Signed)
Central Venous Catheter Insertion Performed by: Effie Berkshire, MD, anesthesiologist Start/End12/06/2022 4:45 AM, 08/04/2022 4:55 AM Patient location: Pre-op. Preanesthetic checklist: patient identified, IV checked, site marked, risks and benefits discussed, surgical consent, monitors and equipment checked, pre-op evaluation, timeout performed and anesthesia consent Position: Trendelenburg Lidocaine 1% used for infiltration and patient sedated Hand hygiene performed , maximum sterile barriers used  and Seldinger technique used Catheter size: 8 Fr Total catheter length 16. Central line was placed.Double lumen Procedure performed using ultrasound guided technique. Ultrasound Notes:anatomy identified, needle tip was noted to be adjacent to the nerve/plexus identified, no ultrasound evidence of intravascular and/or intraneural injection and image(s) printed for medical record Attempts: 1 Following insertion, dressing applied, line sutured and Biopatch. Post procedure assessment: blood return through all ports  Patient tolerated the procedure well with no immediate complications.

## 2022-08-04 NOTE — Progress Notes (Signed)
[name] Progress Note Patient Name: Heather [name] DOB: April 29, 1951 MRN: [id number]   Date of Service  08/04/2022  HPI/Events of Note  Notified of hyperglycemia with glucose at 454.  Pt takes levemir 18 units at night and novolog 5 units TID.   eICU Interventions  Give 5 units IV insulin now.  Give 12 units novolog now.  Follow glucoses q4hrs.      Intervention Category Intermediate Interventions: Hyperglycemia - evaluation and treatment  Elsie Lincoln 08/04/2022, 3:23 AM

## 2022-08-04 NOTE — Op Note (Signed)
Patient name: Heather [name] MRN: [id number] DOB: Mar 23, 1951 Sex: female  08/04/2022 Pre-operative Diagnosis: Bilateral lower extremity [place] 2B acute limb ischemia, intracranial hemorrhage Post-operative diagnosis:  Same Surgeon:  Eda Paschal. Donzetta Matters, MD Assistant: Laurence Slate, PA Procedure Performed: 1.  Bilateral common femoral endarterectomy with bovine pericardial patch angioplasty 2.  Bilateral iliofemoral embolectomy with 4 Fogarty 3.  Bilateral lower extremity embolectomy with long 3 Fogarty and short 4 Fogarty 4.  Aortogram 5.  Stent of bilateral common iliac arteries with 7 x 59 mm VBX 6.  Stent of right external iliac artery with 7 x 100 mm Innova and stent of left external iliac artery with 7 x 60 mm Innova 7.  Bilateral 4 compartment fasciotomies   Indications: 71 year old female with a history of bilateral common iliac artery stenting and bilateral SFA stenting more recently has been scheduled for bilateral common femoral endarterectomy.  She unfortunately had intracranial hemorrhage and presented to [facility] where she was given DDAVP and ultimately was found to have bilateral cold lower extremities.  On my initial evaluation she had mottling and numbness to the level of the knees by the time we had presented to the OR she was mottled and to the nearly the left groin and on the right side to the mid thigh and she had severe motor weakness bilaterally with inability to bend her knees.  We have discussed with her the high risk nature and life and limb threatening nature of this disease process and particularly the risk of giving heparin in the setting of intracranial hemorrhage and she demonstrates good understanding and wants to proceed in hopes of salvaging her lower extremities.  I have discussed with her that we will focus heavily on obtaining inflow to the bilateral lower extremities that outflow will not be the focus of today's operation and that she will remain high  risk for bilateral above-knee amputations even after this operation should she survive from an intracranial hemorrhage standpoint.  We also discussed with her the possible need for right axillary to bifemoral bypass after common femoral endarterectomies.  Findings: The bilateral common femoral arteries were heavily calcified and quite diminutive.  The left side appeared subtotally occluded.  We performed bilateral common femoral endarterectomies extending up into the external iliac arteries under the inguinal ligament down into the proximal SFA just proximal to the stents and including the profunda femoris arteries.  On the right side we were able to perform embolectomy and establish strong inflow on the left side I could not establish any inflow.  On the right side we were able to pass a 3 Fogarty all the way to the hub and a short 4 Fogarty to the hub and return thrombus and had good backbleeding.  On the left side I was also able to pass 3 and 4 Fogarty's that were hubbed but I could not establish very good backflow but I did have decent backbleeding from the profunda as well as on the right side there was excellent profunda backbleeding.  After patch angioplasties we then cannulated the bilateral common patches and performed aortogram from both sides and were true luminal and then realigned the common iliac artery stents with 7 x 59 mm VBX's and distally where there was dissection we stented on the right side with a 7 x 100 mm Innova on the left side with a 7 x 60 mm Innova.  We then had very strong inflow to the common femoral arteries bilaterally.  On the right side  there was flow into the proximal SFA which was very diseased and there was a stent which was patent.  On the left side the stent did not appear patent and there was no severe disease.  I then performed 4 compartment fasciotomies on the right side the muscle was all reactive and did have good bleeding on the left side the muscle did not appear  reactive and given that we know she was high risk for amputation we elected for no further revascularization options on the left.   Procedure:  The patient was identified in the holding area and taken to the operating room where she is placed supine operative table and general anesthesia was induced.  She was sterilely prepped and draped in the neck and chest abdomen bilateral groins and bilateral lower extremities in the usual fashion, antibiotics were minister timeout was called.  We began with vertical incision in the right groin dissected down to the very diminutive common femoral artery which was nonpulsatile was heavily calcified and dissected out the SFA profunda up under the inguinal ligament we divided the crossing vein and up to a softer area of the external iliac artery.  We initially gave 2000 units of heparin at this time.  We then opened the previous incision in the left groin reexposed in the left common femoral artery which required a longer incision given the dense scar tissue.  Common femoral artery, 2 branches of the profunda, the SFA and the extrailiac artery were all encircled with Vesseloops as were multiple side branches and we divided the crossing vein and dissected under the inguinal ligament and we found 1 area that was soft for clamping.  At this time an additional 5000 units of heparin was administered.  We began on the left side which was the more disease side and opened the vessel longitudinally.  There was minimal to no backbleeding or antegrade bleeding.  We then performed extensive endarterectomy including the SFA, profunda and up under the inguinal ligament into the external neck artery.  We still had very minimal backbleeding.  We passed a 3 Fogarty all the way to the hub followed by 4 Fogarty and this took multiple passes but ultimately we were able to establish some backbleeding after thrombus was removed.  We attempted to pass a 4 Fogarty proximally but it would only go to 10  cm and I could not establish strong inflow although some thrombus was removed.  We then trimmed a bovine pericardial patch and sewed this in place with 5-0 Prolene suture.  Prior completion without flushing all directions.  We then turned our attention to the right side.  Again we clamped the inflow and outflow and opened the vessel longitudinally and we encountered heavily diseased vessel and performed extensive endarterectomy including the SFA, profunda up under the inguinal ligament into the external iliac artery.  I was able to pass a 4 Fogarty on this side and establish strong inflow and this artery was clamped.  I was also able to pass a 3 Fogarty all the way to the hub on the left and a 4 Fogarty that was shorter to the hub as well and after clot was returned on several passes we had 1 clean past and there was good backbleeding and the vessel was flushed with heparinized saline and clamped.  Bovine pericardial patch angioplasty was then performed.  Both patches were then cannulated with micropuncture needle followed by wire and sheath in a retrograde fashion 5 French sheath were placed.  On the right side I was easily able to place a Glidewire advantage into the aorta and confirmed intraluminal access in the left side appeared occluded by aortogram.  The bilateral renal arteries do feel as though the large IMA and the bilateral hypogastric is also still the left side reconstituting and external iliac artery.  I was unable to get a Glidewire advantage through on the left side and confirmed intraluminal access.  We then primarily stented up top with 7 x 59 mm VBX's to realign the previous covered stents and then performed aortogram and there was dissection on the right and I placed a 7 x 100 mm noncovered stent across the hypogastric I also used bare-metal as patient will be best a candidate for single antiplatelet agent.  On the left side there was a dissection and we used a 7 x 60 mm bare-metal stent.  The  stents were ironed out with 7 mm balloon and then there was very strong inflow into the common femoral arteries.  We performed a limited bilateral lower extremity angiography which demonstrated flow on the right but not on the left.  On the left side I then removed the sheath and extended the arteriotomy in the patch.  The inflow was clamped.  I passed a 3 Fogarty down on multiple occasions did return some platelet appearing thrombus but no frank clot and had very minimal backbleeding.  I did close the patch after thoroughly irrigating.  I had a strong signal in the profunda branch no real signal going into the SFA.  On the right side we removed the sheath and sutured the arteriotomy site.  We had monophasic signal in the SFA and a very strong profunda signal.  4 compartment fasciotomies were then performed bilaterally.  The right lower extremity had reactive muscle that was bleeding the left lower extremity the muscle appeared nonreactive and was actually quite dusky.  I elected no further intervention of the left lower extremity as we were attempting to obtain only inflow to start with and we had good flow on the right side but not on the left.  We did administer 25 mg of protamine.  We obtain hemostasis in the fasciotomy wounds and closed them with staples.  In the groins we obtain hemostasis and then closed in layers with Vicryl deep and then staples at the groin level.  Sterile dressings were applied.  Patient with plan for return to neuro ICU with condition guarded.  An experienced assistant was necessary to facilitate exposure of the bilateral common femoral arteries and facilitate embolectomy as well as wire exchange for stent placement.  EBL: 450 cc  Contrast: 65 cc   Mykai Wendorf C. Donzetta Matters, MD [facility] Office: [phone] Pager: [phone]

## 2022-08-04 NOTE — Progress Notes (Signed)
  Echocardiogram 2D Echocardiogram has been performed.  Heather [name] 08/04/2022, 2:30 PM

## 2022-08-04 NOTE — Progress Notes (Signed)
Patient taken for a [place] prior to emergently being taken to the OR with vascular surgery, after CT the patient was taken to short stay. Report called to Wells Guiles, CRNA.   Insulin and novolog orders were not received prior to CT, therefore it not given. CRNA aware of CBG level.

## 2022-08-04 NOTE — Progress Notes (Signed)
SLP Cancellation Note  Patient Details Name: Heather [name] MRN: [id number] DOB: 1951-02-16   Cancelled treatment:       Reason Eval/Treat Not Completed: Patient not medically ready. Surgery and intubation today. Will f/u  for cognitive linguistic eval.    Wendie Diskin, [name] 08/04/2022, 10:53 AM

## 2022-08-04 NOTE — Progress Notes (Signed)
    Patient remains intubated has been minimally responsive.  Right foot is warm with brisk capillary refill left thigh is warm to the knee below this her left foot is mottled.  Will continue to monitor neurostatus likely will need above-knee amputation on the left as per earlier discussion.  Haidy Kackley C. Donzetta Matters, MD [facility] Office: [phone] Pager: [phone]

## 2022-08-04 NOTE — Progress Notes (Addendum)
STROKE TEAM PROGRESS NOTE   INTERVAL HISTORY Dr. Donzetta Matters and RN at the bedside. Patient just returned from OR. Vasc surg team recommending some sort of antiplatelet or anticoagulant and are concerned that she may need a left AKA. They did achieve reperfusion to her groin and right leg. Heparin IV ordered, repeat head CT at 1530.   Vitals:   08/04/22 1102 08/04/22 1104 08/04/22 1105 08/04/22 1200  BP:      Pulse: 89  88   Resp: 19  16   Temp:    97.8 F (36.6 C)  TempSrc:    Oral  SpO2: 100% 100% 100%   Weight:      Height:       CBC:  Recent Labs  Lab 08/03/22 2156 08/04/22 1114  WBC 13.6*  --   HGB 14.1 9.5*  HCT 43.1 28.0*  MCV 89.6  --   PLT 375  --    Basic Metabolic Panel:  Recent Labs  Lab 08/03/22 2156 08/04/22 0322 08/04/22 1114  NA 135 136 143  K 3.3* 4.3 3.5  CL 100 101  --   CO2 21* 17*  --   GLUCOSE 454* 589*  --   BUN 37* 30*  --   CREATININE 1.26* 1.13*  --   CALCIUM 9.6 9.1  --    Lipid Panel: No results for input(s): "CHOL", "TRIG", "HDL", "CHOLHDL", "VLDL", "LDLCALC" in the last 168 hours. HgbA1c: No results for input(s): "HGBA1C" in the last 168 hours. Urine Drug Screen: No results for input(s): "LABOPIA", "COCAINSCRNUR", "LABBENZ", "AMPHETMU", "THCU", "LABBARB" in the last 168 hours.  Alcohol Level  Recent Labs  Lab 08/04/22 0322  ETH <10    IMAGING past 24 hours DG Abd Portable 1V  Result Date: 08/04/2022 CLINICAL DATA:  Encounter for feeding tube placement EXAM: PORTABLE ABDOMEN - 1 VIEW COMPARISON:  None Available. FINDINGS: Feeding tube courses below the diaphragm with the tip either in distal stomach or proximal duodenum. Small amount of contrast within proximal duodenum. Foley catheter in place. Bilateral iliac stents. Nonspecific bowel gas pattern. Visualized lung bases appear clear. Multilevel degenerative change of the spine. IMPRESSION: Feeding tube courses below the diaphragm with the tip either in distal stomach or proximal  duodenum. Small amount of contrast within proximal duodenum. Electronically Signed   By: Margaretha Sheffield M.D.   On: 08/04/2022 12:37   DG Chest Port 1 View  Result Date: 08/04/2022 CLINICAL DATA:  ET Tube EXAM: PORTABLE CHEST 1 VIEW COMPARISON:  08/03/22 CXR FINDINGS: Endotracheal tube terminates approximately 1 cm above the carina and approaches the right mainstem bronchus. Right-sided central venous catheter with tip at the cavoatrial junction. Postprocedural changes from aortic valve replacement. Coronary stent is visualized. No pleural effusion. No pneumothorax. Unchanged cardiac and mediastinal contours. No focal airspace opacity. No displaced rib fractures. Visualized upper abdomen is unremarkable. IMPRESSION: 1. Endotracheal tube terminates approximately 1 cm above the carina and approaches the right mainstem bronchus. Recommend retraction. 2. No acute cardiopulmonary process. Electronically Signed   By: Marin Roberts M.D.   On: 08/04/2022 11:29   HYBRID OR IMAGING ([facility] ONLY)  Result Date: 08/04/2022 There is no interpretation for this exam.  This order is for images obtained during a surgical procedure.  Please See "Surgeries" Tab for more information regarding the procedure.   CT HEAD WO CONTRAST  Result Date: 08/04/2022 CLINICAL DATA:  Hemorrhagic infarct, follow-up examination EXAM: CT HEAD WITHOUT CONTRAST TECHNIQUE: Contiguous axial images were obtained from the base  of the skull through the vertex without intravenous contrast. RADIATION DOSE REDUCTION: This exam was performed according to the departmental dose-optimization program which includes automated exposure control, adjustment of the mA and/or kV according to patient size and/or use of iterative reconstruction technique. COMPARISON:  None Available. FINDINGS: Brain: Intravascular contrast from concurrently performed lower extremity CT arteriogram noted. The intraparenchymal hematoma within the cerebellar vermis appears stable in  size measuring 3.1 x 3.6 x 2.0 cm (volume = 12 cm^3). Moderate mass effect upon the fourth ventricle and again noted. Ventricular size, however, remains normal. No interval hemorrhage. No acute infarct. Mild parenchymal volume loss is commensurate with the patient's age. Moderate periventricular white matter changes are again seen, likely reflecting the sequela of small vessel ischemia. Vascular: Moderate atherosclerotic calcifications seen within the carotid siphons. Large vessel intracranial structures at the skull base appear patent. Skull: Normal. Negative for fracture or focal lesion. Sinuses/Orbits: No acute finding. Other: Mastoid air cells and middle ear cavities are clear. IMPRESSION: 1. Stable intraparenchymal hematoma within the cerebellar vermis. Stable moderate mass effect upon the fourth ventricle. No hydrocephalus. No interval hemorrhage. 2. Stable senescent changes. Electronically Signed   By: Fidela Salisbury M.D.   On: 08/04/2022 04:22   CT ANGIO AO+BIFEM W & OR WO CONTRAST  Result Date: 08/04/2022 CLINICAL DATA:  Claudication or leg ischemia. EXAM: CT ANGIOGRAPHY OF ABDOMINAL AORTA WITH ILIOFEMORAL RUNOFF TECHNIQUE: Multidetector CT imaging of the abdomen, pelvis and lower extremities was performed using the standard protocol during bolus administration of intravenous contrast. Multiplanar CT image reconstructions and MIPs were obtained to evaluate the vascular anatomy. RADIATION DOSE REDUCTION: This exam was performed according to the departmental dose-optimization program which includes automated exposure control, adjustment of the mA and/or kV according to patient size and/or use of iterative reconstruction technique. CONTRAST:  37m OMNIPAQUE IOHEXOL 350 MG/ML SOLN COMPARISON:  11/10/2020. FINDINGS: VASCULAR Aorta: Celiac: Atherosclerotic calcification and mural thrombus at the origin. The celiac artery and major branches appear patent. SMA: Atherosclerotic calcification at the origin with  focal high-grade stenosis with poststenotic dilatation of the mid SMA measuring 6 mm. The SMA is patent. Renals: Atherosclerotic calcification at the origin of the bilateral renal arteries with no significant stenosis. An accessory renal artery is noted on the right. IMA: Atherosclerotic calcification of the proximal IMA which appears patent. RIGHT Lower Extremity Inflow: Diffuse atherosclerotic calcification. A stent is in place in the common iliac artery, which appears occluded. There is reconstitution at the level of the external iliac artery. There is atherosclerotic calcification of the external iliac artery resulting in moderate to severe stenosis. The internal iliac artery appears patent. Outflow: Atherosclerotic calcification and mural thrombus. A long stent is noted in the superficial femoral artery some areas of opacification, not wall delineated on exam. There is multifocal high-grade stenosis involving the common femoral and popliteal arteries. The proximal profunda femoral artery is patent, however the mid to distal branches are not definitely seen. Runoff: Atherosclerotic calcification. Proximal anterior tibial and tibioperoneal trunk appear patent. There is heavy atherosclerotic calcification of the posterior tibial artery with likely multifocal high-grade stenosis. The peroneal vein is not seen. LEFT Lower Extremity Inflow: Diffuse atherosclerotic calcification. A stent is present in the common iliac artery and appears occluded. There is reconstitution at the level of the external and internal iliac arteries with moderate to severe stenosis of the external iliac artery on the left. The internal iliac artery appears patent Outflow: Atherosclerotic calcification. The common femoral artery is patent. There is  multifocal high-grade stenosis involving the profunda artery. A stent is noted in the proximal SFA which appears occluded. There is also occlusion of the mid to distal SFA. Runoff: Heavy  atherosclerotic calcification with no definite enhancement of the anterior posterior tibial arteries. The peroneal artery is not definitely visualized on exam. Veins: No obvious venous abnormality within the limitations of this arterial phase study. Review of the MIP images confirms the above findings. NON-VASCULAR Lower chest: Mild atelectasis at the lung bases. There is a small hiatal hernia. Hepatobiliary: No focal liver abnormality is seen. Status post cholecystectomy. No biliary dilatation. Pancreas: Mild pancreatic atrophy. No pancreatic ductal dilatation or surrounding inflammatory changes. Spleen: Normal in size without focal abnormality. Adrenals/Urinary Tract: There is thickening of the left adrenal gland without evidence of discrete nodule. The right adrenal gland is within normal limits. Hypodense region is noted in the cortex of the lower pole the left kidney which is new from the previous exam. Evaluation for renal calculus is limited due to excreted contrast at the renal pyramids. An extrarenal pelvis is noted on the right. No obstructive uropathy bilaterally. The bladder is unremarkable. Stomach/Bowel: Small hiatal hernia. Stomach is otherwise within normal limits. Appendix appears normal. No bowel obstruction. No free air or pneumatosis. Multiple scattered diverticula are present along the colon. There is bowel wall thickening at the sigmoid colon with suggestion of mild pericolonic fat stranding, possible diverticulitis. A normal appendix is seen in the right lower quadrant Lymphatic: No abdominal or pelvic lymphadenopathy. Reproductive: Uterus and bilateral adnexa are unremarkable. Other: No abdominopelvic ascites. Fat containing inguinal hernias are present bilaterally. A small fat containing umbilical hernia is noted. Musculoskeletal: Degenerative changes are present in the thoracolumbar spine. There is a compression deformity in the superior endplate T11, unchanged from the previous exam. No  acute fracture or dislocation in the bilateral lower extremities. IMPRESSION: VASCULAR 1. 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 2. Reconstitution of the external iliac arteries bilaterally with moderate-to-severe stenosis. 3. Right SFA stent appear somewhat patent but is difficult to assess. 4. Occlusion of the SFA and popliteal artery on the left. 5. Multifocal areas of high-grade stenosis involving the mid to distal superficial femoral and popliteal arteries on the right. The proximal anterior tibial and tibioperoneal trunk appear patent. Heavy atherosclerotic calcification of the posterior tibial artery with likely multifocal high-grade stenosis. Peroneal artery on the right is not seen. 6. Reconstitution of the posterior tibial artery and proximal anterior tibial artery with likely multifocal high-grade stenosis on the left. The left peroneal artery is not seen. NON-VASCULAR 1. Colonic diverticulosis with thickening of the walls of the sigmoid colon and questionable pericolonic fat stranding, possible early diverticulitis. 2. New hypodense region in the cortex of the lower pole of the left kidney, scarring versus infarct or pyelonephritis. 3. Remaining incidental findings as described above. Electronically Signed   By: Brett Fairy M.D.   On: 08/04/2022 03:26   CT Lumbar Spine Wo Contrast  Result Date: 08/04/2022 CLINICAL DATA:  Initial evaluation for acute dizziness, fall. EXAM: CT LUMBAR SPINE WITHOUT CONTRAST TECHNIQUE: Multidetector CT imaging of the lumbar spine was performed without intravenous contrast administration. Multiplanar CT image reconstructions were also generated. RADIATION DOSE REDUCTION: This exam was performed according to the departmental dose-optimization program which includes automated exposure control, adjustment of the mA and/or kV according to patient size and/or use of iterative reconstruction technique. COMPARISON:  None available. FINDINGS: Segmentation:  Standard. Lowest well-formed disc space labeled the L5-S1 level. Alignment: Mild  levoscoliosis, apex at L4. Associated slight lateral listhesis of L4 on L5 on AP view. No other significant listhesis. Vertebrae: Mild chronic compression deformities involving the superior endplates of F81 and O17. Changes are most pronounced at T11 where there is associated 40% height loss and trace 2 mm bony retropulsion. No acute or recent fracture. Visualized sacrum and pelvis intact. No worrisome osseous lesions. Visualized lower ribs intact. Paraspinal and other soft tissues: Paraspinous soft tissues demonstrate no acute finding. Advanced aorto bi-iliac atherosclerotic disease. Vascular stent in place at the origin of the celiac artery. Bilateral kissing iliac stents in place. Prior cholecystectomy. Colonic diverticulosis partially visualized. Disc levels: T10-11: 2 mm bony retropulsion related to the T11 compression fracture. No significant spinal stenosis. Foramina remain patent. T11-12: Disc desiccation without significant disc bulge. Mild facet hypertrophy. No stenosis. T12-L1: Unremarkable. L1-2:  Unremarkable. L2-3: Minimal disc bulge. No spinal stenosis. Foramina remain patent. L3-4: Mild circumferential disc bulge. Mild facet hypertrophy. No spinal stenosis. Foramina remain patent. L4-5: Advanced degenerative intervertebral disc space narrowing with disc desiccation and diffuse disc bulge. Superimposed central to right subarticular disc protrusion indents the ventral thecal sac (series 6, image 93). Moderate bilateral facet hypertrophy. Resultant moderate canal with bilateral subarticular stenosis. Moderate right worse than left L4 foraminal narrowing. L5-S1: Advanced degenerative intervertebral disc space narrowing with disc desiccation and diffuse disc bulge. Superimposed central disc protrusion contacts both of the descending S1 nerve roots without frank impingement. Moderate left worse than right facet arthrosis. No  spinal stenosis. Severe left L5 foraminal narrowing. Right neural foramen remains patent. IMPRESSION: 1. No acute osseous abnormality within the lumbar spine. 2. Chronic compression deformities involving the superior endplates of P10 and C58. Changes are most pronounced at T11 where there is associated 40% height loss and trace 2 mm bony retropulsion. No significant spinal stenosis. 3. 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 Resultant moderate spinal stenosis at L4-5, with moderate to severe bilateral L4 and L5 foraminal narrowing as above. 4. Colonic diverticulosis. Aortic Atherosclerosis (ICD10-I70.0). Electronically Signed   By: Jeannine Boga M.D.   On: 08/04/2022 00:45   CT ANGIO HEAD NECK W WO CM  Result Date: 08/04/2022 CLINICAL DATA:  Initial evaluation for hemorrhagic stroke. Dizziness. EXAM: CT ANGIOGRAPHY HEAD AND NECK TECHNIQUE: Multidetector CT imaging of the head and neck was performed using the standard protocol during bolus administration of intravenous contrast. Multiplanar CT image reconstructions and MIPs were obtained to evaluate the vascular anatomy. Carotid stenosis measurements (when applicable) are obtained utilizing NASCET criteria, using the distal internal carotid diameter as the denominator. RADIATION DOSE REDUCTION: This exam was performed according to the departmental dose-optimization program which includes automated exposure control, adjustment of the mA and/or kV according to patient size and/or use of iterative reconstruction technique. CONTRAST:  65m OMNIPAQUE IOHEXOL 350 MG/ML SOLN COMPARISON:  Prior CT from earlier the same day. FINDINGS: CTA NECK FINDINGS Aortic arch: Visualized aortic arch normal caliber with normal 3 vessel morphology. Moderate atheromatous change about the arch and origin of the great vessels. Associated up to approximately 70% stenosis at the origin of the innominate artery (series 4, image 291). Right carotid system:  Right common and internal carotid arteries are patent without dissection. Moderate atheromatous change about the right carotid bulb without hemodynamically significant greater than 50% stenosis. Left carotid system: Left common and internal carotid arteries are patent without dissection. Mild-to-moderate atheromatous change about the left carotid bulb without hemodynamically significant greater than 50% stenosis. Vertebral arteries: Both  vertebral arteries arise from the subclavian arteries. Atheromatous plaque at the origin of the left vertebral artery with mild to moderate ostial stenosis. Focal atheromatous plaque within the pre foraminal right V1 segment with associated moderate short-segment stenosis (series 4, image 240). Vertebral arteries otherwise patent without stenosis or dissection. Skeleton: No discrete or worrisome osseous lesions. Advanced degenerative spondylosis noted at C3-4 through C6-7. Patient is edentulous. Other neck: No other acute soft tissue abnormality within the neck. 9 mm left thyroid nodule noted, of doubtful significance given size and patient age, no follow-up imaging recommended (ref: J Am Coll Radiol. 2015 Feb;12(2): 143-50). Upper chest: Upper lobe predominant emphysema. Visualized upper chest demonstrates no other acute finding. Review of the MIP images confirms the above findings CTA HEAD FINDINGS Anterior circulation: Petrous segments patent bilaterally. Atheromatous change seen throughout the carotid siphons with associated mild to moderate diffuse narrowing. A1 segments patent. Normal anterior communicating artery complex. Anterior cerebral arteries patent without stenosis. No M1 stenosis or occlusion. No proximal MCA branch occlusion or high-grade stenosis. Distal MCA branches perfused and symmetric. Posterior circulation: Scattered atheromatous changes seen within the V4 segments bilaterally with no more than mild stenoses. Right vertebral artery slightly dominant. Right  PICA grossly patent at its origin. Left PICA not well seen. Basilar patent to its distal aspect without stenosis. Tiny 2 mm outpouching extending posteriorly from the mid basilar artery noted, demonstrating a small vessel emanating from its apex, consistent with a vascular infundibulum (series 4, image 106). Superior cerebellar and posterior cerebral arteries patent bilaterally. Venous sinuses: Patent allowing for timing the contrast bolus. Anatomic variants: None significant. Previously seen cerebellar hemorrhage is grossly stable from prior CT measuring approximately 3.9 x 2.6 x 1.8 cm (estimated volume 9 mL). No visible underlying vascular abnormality. Review of the MIP images confirms the above findings IMPRESSION: 1. Negative CTA for large vessel occlusion or other emergent finding. 2. No significant interval change in previously identified acute cerebellar hemorrhage, estimated volume 9 mL. No visible underlying vascular abnormality or spot sign. 3. Atheromatous change about the carotid bifurcations and carotid siphons without hemodynamically significant greater than 50% stenosis. Mild to moderate diffuse narrowing about both carotid siphons. 4. 70% stenosis at the origin of the innominate artery. 5. Moderate short-segment stenosis at the pre foraminal right V1 segment. Mild to moderate ostial stenosis at the origin of the left vertebral artery. Electronically Signed   By: Jeannine Boga M.D.   On: 08/04/2022 00:33   DG Chest Port 1 View  Result Date: 08/03/2022 CLINICAL DATA:  Shortness of breath EXAM: PORTABLE CHEST 1 VIEW COMPARISON:  11/09/2020 FINDINGS: Cardiac shadow is stable. Changes of prior TAVR and coronary stenting seen. Aortic calcifications are noted. The lungs are clear bilaterally. No bony abnormality is noted. IMPRESSION: No active disease. Electronically Signed   By: Inez Catalina M.D.   On: 08/03/2022 22:21   CT Head Wo Contrast  Result Date: 08/03/2022 CLINICAL DATA:  Neuro  deficit, acute, stroke suspected EXAM: CT HEAD WITHOUT CONTRAST TECHNIQUE: Contiguous axial images were obtained from the base of the skull through the vertex without intravenous contrast. RADIATION DOSE REDUCTION: This exam was performed according to the departmental dose-optimization program which includes automated exposure control, adjustment of the mA and/or kV according to patient size and/or use of iterative reconstruction technique. COMPARISON:  None Available. FINDINGS: Brain: There is an acute intraparenchymal hematoma within the cerebellar vermis measuring 3.1 x 4.1 x 1.7 cm (volume = 11 cm^3) demonstrating mild mass effect upon  the fourth ventricle. No transforaminal or transtentorial cerebellar herniation. Ventricular size is normal. Mild parenchymal volume loss is commensurate with the patient's age. Moderate periventricular white matter changes are present likely reflecting the sequela of small vessel ischemia. No additional abnormal mass effect. No midline shift. No abnormal extra-axial fluid collection. Vascular: No hyperdense vessel or unexpected calcification. Skull: Normal. Negative for fracture or focal lesion. Sinuses/Orbits: Paranasal sinuses are clear. Ocular lenses have been removed. Orbits are otherwise unremarkable. Other: Mastoid air cells and middle ear cavities are clear. IMPRESSION: 1. 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 No transforaminal or transtentorial cerebellar herniation. 2. Moderate senescent change. 3. These results were called by telephone at the time of interpretation on 08/03/2022 at 10:17 pm to provider [facility] , who verbally acknowledged these results. Electronically Signed   By: Fidela Salisbury M.D.   On: 08/03/2022 22:18    PHYSICAL EXAM  Physical Exam  Constitutional: Elderly, ill appearing female Cardiovascular: Normal rate and regular rhythm.   Respiratory: Mechanically ventilated   Neuro: Mental Status: Intubated, opens eyes to voice, but does not track or follow commands Cranial Nerves: Opens eyes to voice, pupils are 78m and reactive. Does not track examiner. Eyes are midline, occulocephalic reflex intact. Corneals intact. Head is midline Cough and gag intact.  Motor: Extremities are stiff. Upper extremities move antigravity, right more than left.  No movement in lower extremities to painful stimuli.    ASSESSMENT/PLAN Ms. Heather Aurichis a 71y.o. female with history of  smoker(2ppd), severe AI/AS s/p TAVR, CAD s/p PCI and on DAPT, COPD, PAD, HTN [facility], dizziness while sitting on the toilet having a bowel movement and pushing down. She fell back and hit her back but not her head. She reported significant severe headache in the back of her head and [facility] ver[facility] with about 11cc of IPH with mild mass effect on the 4th ventricle with no herniation. She was given DDAVP. She was hypertensive to 2440Nsystolic and was started on Nicardipine gtt. She endorses pain from her thighs down her legs. Her lower extremities are cold to touch and appear a little mottled. [facility] and she was emergently taken to the OR. Returned to 4N intubated.   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 70% stenosis of innominate artery origin, bilateral VA origin stenosis Follow up Head CT- Stable intraparenchymal hematoma within the cerebellar vermis. Stable moderate  mass effect upon the fourth ventricle. MRI brain w wo pending 2D Echo EF 60 to 65% LDL pending HgbA1c 12.0 in 06/2022 VTE prophylaxis - heparin IV aspirin 81 mg daily and clopidogrel 75 mg daily prior to admission, now on heparin IV per stroke protocol Therapy recommendations:  pending Disposition:  pending   PAD status post stenting Bilateral lower extremity [place] 2B acute limb ischemia vascular surgery on board S/p b/l common femoral endarterectomy, b/l iliofemoral embolectomy and stent of b/l common iliac arteries, right external iliac artery  and left external iliac artery, b/l 4 compartment fasciotomies Right leg still risk of amputation Now on heparin IV at stroke goal 0.3-0.5  Hypertension Home meds:  metoprolol succinate, losartan Stable BP goal < 160 Long-term BP goal normotensive  Hyperlipidemia Home meds:  atorvastatin '80mg'$ , resumed in hospital LDL pending, goal < 70 Continue statin at discharge  Diabetes type II Uncontrolled Home meds:  metformin, insulin, levemir  HgbA1c 12.0, goal < 7.0 Hyperglycemia  CBGs SSI On insulin gtt  Acute respiratory failure  Intubated , wean per CCM CCM on board On propofol  Dysphagia  Cortrak placed   Tobacco abuse Current smoker Smoking cessation counseling will be provided Nicotine patch provided  Other Stroke Risk Factors Advanced Age >/= 19  Coronary artery disease status post PCI  Other Active Problems COPD Status post TAVR   Hospital day # 0  Patient seen and examined by NP/APP with MD. MD to update note as needed.   Janine Ores, DNP, FNP-BC [facility] Pager: [phone]  ATTENDING NOTE: I reviewed above note and agree with the assessment and plan. Pt was seen and examined.   71 year old female with history of hypertension, hyperlipidemia, PAD status post stenting, CAD status post PCI on DAPT, COPD, status post TAVR, smoker admitted for headache and dizziness after straining  during bowel movement, fell backwards on the toilet.  CT showed left cerebellar vermis ICH, status post DDAVP.  BP significantly elevated, on Cleviprex.  Complaint bilateral lower extremity pain and code.  Found no lower extremity pulses.  Vascular surgery consulted, confirmed bilateral lower extremity ischemic limbs.  Repeat CT head stable several ICH.  Patient taken OR for femoral artery and iliac artery thrombectomy.  CTA head and neck 70% stenosis of innominate artery origin, bilateral VA origin stenosis.  2D echo EF 60 to 65%, LDL and A1c pending, UDS pending.  Creatinine 1.13, significant hyperglycemia, WBC 13.6.  MRI pending.  Patient seen in 4 N. ICU after vascular surgery, together with Dr. Donzetta Matters.  Patient intubated, on sedation.  However open eyes on voice, bilateral upper extremity against gravity.  However not moving bilateral lower extremities.  Left lower extremity still pale and cold, right lower extremity improved circulation.  Etiology for patient ICH likely due to straining in the setting of small vessel disease due to uncontrolled risk factors.  Bilateral lower extremity ischemic limbs likely due to severe vasculopathy.  Although cerebellar ICH, patient on heparin IV per stroke protocol given severe ischemic limbs.  Close neuromonitoring.  MRI and CT for hematoma and hydrocephalus monitoring.  Appreciate CCM help for ventilation management.  BP goal less than 160 due to [place], currently on Cleviprex.  Severe hyperglycemia, on insulin drip.  Continue statin.  For detailed assessment and plan, please refer to above/below as I have made changes wherever appropriate.   This patient is critically ill due to cerebellar ICH, bilateral lower extremity ischemic limbs, respiratory failure, significant hyperglycemia and at significant risk of neurological worsening, death form recurrent ICH, hematoma expansion, ischemic limbs, brain herniation, DKA, seizure. This patient's care requires constant  monitoring of vital signs, hemodynamics, respiratory and cardiac monitoring, review of multiple databases, neurological assessment, discussion with family, other specialists and medical decision making of high complexity. I spent 40 minutes of neurocritical care time in the care of this patient.    Rosalin Hawking, MD PhD Stroke Neurology 08/04/2022 5:19 PM    To contact Stroke Continuity provider, please refer to [email or web address] After hours, contact General Neurology

## 2022-08-04 NOTE — H&P (Signed)
NEUROLOGY H and P NOTE   Date of service: August 04, 2022 Patient Name: Heather [name] MRN:  [id number] DOB:  September 29, 1950  _ _ _   _ __   _ __ _ _  __ __   _ __   __ _  History of Present Illness  Heather [name] is a 71 y.o. female with PMH significant for smoker(2ppd), severe AI/AS s/p TAVR, CAD s/p PCI and on DAPT, COPD, PAD, HTN who presented to [facility] ED with sudden onset headache, dizziness while sitting on the toilet having a bowel movement and pushing down. She fell back and hit her back but not her head.  She reported significant severe headache in the back of her head and was brought in to the ED where [facility] demonstrated acute IPH in the cerebellar vermis with about 11cc of IPH with mild mass effect on the 4th ventricle with no herniation.  She was given DDAVP. She was hypertensive to 416S systolic and was started on Nicardipine gtt.  In the ED, she also reported pain in BL lower extremities. She reports narrowing of the vessel in her belly and that team is planning to "scrape off the arteries" but havent gotten to it yet. [facility] in BL lower extremities pain. She endorses pain from her thighs down her legs. Her lower extremities are cold to touch and appear a little mottled.  LKW: 2030 on 08/03/22. ICH score: 1 mRS: 0 tNKASE: not offered, 2/2 ICH Thrombectomy: not offered, 2/2 ICH NIHSS components Score: Comment  1a Level of Conscious 0'[x]'$  1'[]'$  2'[]'$  3'[]'$      1b LOC Questions 0'[x]'$  1'[]'$  2'[]'$       1c LOC Commands 0'[x]'$  1'[]'$  2'[]'$       2 Best Gaze 0'[x]'$  1'[]'$  2'[]'$       3 Visual 0'[x]'$  1'[]'$  2'[]'$  3'[]'$      4 Facial Palsy 0'[x]'$  1'[]'$  2'[]'$  3'[]'$      5a Motor Arm - left 0'[x]'$  1'[]'$  2'[]'$  3'[]'$  4'[]'$  UN'[]'$    5b Motor Arm - Right 0'[x]'$  1'[]'$  2'[]'$  3'[]'$  4'[]'$  UN'[]'$    6a Motor Leg - Left 0'[]'$  1'[]'$  2'[x]'$  3'[]'$  4'[]'$  UN'[]'$    6b Motor Leg - Right 0'[]'$  1'[]'$  2'[x]'$  3'[]'$  4'[]'$  UN'[]'$    7 Limb Ataxia 0'[x]'$  1'[]'$  2'[]'$  3'[]'$  UN'[]'$     8 Sensory 0'[x]'$  1'[]'$  2'[]'$  UN'[]'$      9 Best Language 0'[x]'$  1'[]'$  2'[]'$  3'[]'$       10 Dysarthria 0'[x]'$  1'[]'$  2'[]'$  UN'[]'$      11 Extinct. and Inattention 0'[x]'$  1'[]'$  2'[]'$       TOTAL: 4     ROS   Constitutional Denies weight loss, fever and chills.  HEENT Denies changes in vision and hearing.   Respiratory Denies SOB and cough.   CV Denies palpitations and CP   GI Denies abdominal pain, nausea, vomiting and diarrhea.   GU Denies dysuria and urinary frequency.   MSK + myalgia and joint pain.   Skin Denies rash and pruritus.   Neurological + headache but no syncope.   Psychiatric Denies recent changes in mood. Denies anxiety and depression.    Past History   Past Medical History:  Diagnosis Date   (HFpEF) heart failure with preserved ejection fraction (HCC)    Angina pectoris ([place]    Anxiety    Aortic atherosclerosis ([place]    Basal cell carcinoma    Bilateral carotid artery disease ([place]    a.) carotid  doppler 03/18/2021: 2-99% RICA, 24-26% LICA   Bipolar disorder (HCC)    Cervicalgia    Chronic mesenteric ischemia (HCC)    a.) s/p PTA 11/13/2020 --> 6 x 16 mm lifestream ballon expanding stent to SMA   COPD (chronic obstructive pulmonary disease) (HCC)    Coronary artery disease 03/20/2014   a.) LHC/PCI 03/20/2014: 70% mLAD (2.5 x 28 mm Xience Alpine DES), 70% dLCx, 70% dRCA; b.) LHC 01/25/2016: 90% ISR mLAD --> mLAD dissection with in stent --> 2.75 x 22 mm Resolute DES; c.) R/LHC 09/29/2016: 50% ISR mLAD, 70% dLAD, mPA 37, PCWP 24, AO sat 90%, PA sat 46%, CO 3.7, CI 2.46 - med mgmt   DDD (degenerative disc disease), cervical    Depression    Diabetic polyneuropathy (HCC)    GERD (gastroesophageal reflux disease)    Hyperlipidemia    Hypertension    Long term current use of antithrombotics/antiplatelets    a.) on DAPT (ASA + clopidogrel)   Memory loss    NSTEMI (non-ST elevated myocardial infarction) ([place]) 07/2016   a.) troponins trended (normal < 0.034 ng/mL): 0.325 --> 1.480 --> 2.330 --> 1.660 --> 0.169 ng/mL   PAD (peripheral artery disease) ([place])    a.)  s/p PTA and stenting SMA 11/13/2020; b.) s/p PTA and stenting of RIGHT SFA and popliteal 04/12/2021; c.) s/p PTA and kissing balloon stents to BILATERAL CIAs 05/28/2021   Respiratory failure with hypoxia (HCC)    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   a.) 23 mm Sapien 3 via suprasternal approach   Schizophrenia ([place])    Severe aortic stenosis    a.) s/p TAVR 09/30/2016; 23 mm Sapien 3 via a suprasternal approach   Type 2 diabetes mellitus treated with insulin ([place])    Vertigo    Past Surgical History:  Procedure Laterality Date   AORTIC VALVE REPLACEMENT  09/30/2016   Procedure: TRANSCATHETER AORTIC VALVE REPAIR   BLADDER SURGERY     CATARACT EXTRACTION, BILATERAL     CHOLECYSTECTOMY     COLONOSCOPY     CORONARY ANGIOPLASTY WITH STENT PLACEMENT  03/20/2014   Procedure: CORONARY ANGIOPLASTY WITH STENT PLACEMENT; Location: [facility]; Surgeon: Jacques Earthly, MD   CORONARY ANGIOPLASTY WITH STENT PLACEMENT Left 01/25/2016   Procedure: CORONARY ANGIOPLASTY WITH STENT PLACEMENT; Location: [facility]; Surgeon: Jacques Earthly, MD   FOOT SURGERY Right    HEMORRHOID SURGERY     IRRIGATION AND DEBRIDEMENT HEMATOMA Left 07/08/2014   GROIN   LOWER EXTREMITY ANGIOGRAPHY Right 04/12/2021   Procedure: Lower Extremity Angiography;  Surgeon: [name], MD;  Location: [place] [facility];  Service: Cardiovascular;  Laterality: Right;   LOWER EXTREMITY ANGIOGRAPHY Left 05/28/2021   Procedure: LOWER EXTREMITY ANGIOGRAPHY;  Surgeon: [name], MD;  Location: [place] [facility];  Service: Cardiovascular;  Laterality: Left;   LOWER EXTREMITY ANGIOGRAPHY Left 03/25/2022   Procedure: Lower Extremity Angiography;  Surgeon: [name], MD;  Location: [place] [facility];  Service: Cardiovascular;  Laterality: Left;   PSEUDOANEURYSM REPAIR Left 06/30/2014   FEMORAL ARTERY   RIGHT/LEFT HEART CATH AND CORONARY ANGIOGRAPHY Bilateral 09/29/2016   Procedure: RIGHT/LEFT  HEART CATH AND CORONARY ANGIOGRAPHY; Location [facility]; Surgeon: Jacques Earthly, MD   TUBAL LIGATION     VISCERAL ANGIOGRAPHY N/A 11/13/2020   Procedure: VISCERAL ANGIOGRAPHY;  Surgeon: [name], MD;  Location: [place] [facility];  Service: Cardiovascular;  Laterality: N/A;   Family History  Problem Relation Age of Onset   Mental illness  Mother    Breast cancer Neg Hx    Social History   Socioeconomic History   Marital status: Married    Spouse name: [name]   Number of children: 2   Years of education: Not on file   Highest education level: High school graduate  Occupational History   Not on file  Tobacco Use   Smoking status: Every Day    Packs/day: 1.50    Years: 48.00    Total pack years: 72.00    Types: Cigarettes   Smokeless tobacco: Never  Vaping Use   Vaping Use: Never used  Substance and Sexual Activity   Alcohol use: Not Currently   Drug use: Not Currently   Sexual activity: Not Currently  Other Topics Concern   Not on file  Social History Narrative   Lives at home with [employer]   Social Determinants of Health   Financial Resource Strain: Medium Risk (10/19/2018)   Overall Financial Resource Strain (CARDIA)    Difficulty of Paying Living Expenses: Somewhat hard  Food Insecurity: Food Insecurity Present (10/19/2018)   Hunger Vital Sign    Worried About [place] in the Last Year: Often true    Ran Out of Food in the Last Year: Often true  Transportation Needs: No Transportation Needs (10/19/2018)   PRAPARE - Hydrologist (Medical): No    Lack of Transportation (Non-Medical): No  Physical Activity: Inactive (10/19/2018)   Exercise Vital Sign    Days of Exercise per Week: 0 days    Minutes of Exercise per Session: 0 min  Stress: Not on file  Social Connections: Unknown (10/19/2018)   Social Connection and Isolation Panel [NHANES]    Frequency of Communication with Friends and Family: Not on file    Frequency of  Social Gatherings with Friends and Family: Not on file    Attends Religious Services: More than 4 times per year    Active Member of [employer] or Organizations: No    Attends Archivist Meetings: Never    Marital Status: Married   Allergies  Allergen Reactions   Iodinated Contrast Media Hives   Sulfa Antibiotics Hives   Shellfish Allergy Rash and Other (See Comments)    Scallops specifically cause VOMITING Scallops specifically cause VOMITING     Medications   Medications Prior to Admission  Medication Sig Dispense Refill Last Dose   albuterol (VENTOLIN HFA) 108 (90 Base) MCG/ACT inhaler Inhale 2 puffs into the lungs every 4 (four) hours as needed for wheezing or shortness of breath.      aspirin EC 81 MG tablet Take 81 mg by mouth daily.      atorvastatin (LIPITOR) 80 MG tablet Take 80 mg by mouth at bedtime.      cholecalciferol (VITAMIN D) 1000 units tablet Take 1,000 Units by mouth daily.      clopidogrel (PLAVIX) 75 MG tablet Take 1 tablet (75 mg total) by mouth daily. 30 tablet 6    diphenhydrAMINE (BENADRYL) 25 mg capsule Take 25 mg by mouth every 8 (eight) hours as needed.      escitalopram (LEXAPRO) 20 MG tablet Take 1 tablet (20 mg total) by mouth daily. 90 tablet 0    famotidine (PEPCID) 20 MG tablet Take 20 mg by mouth 2 (two) times daily as needed for heartburn or indigestion.      fluticasone (FLONASE) 50 MCG/ACT nasal spray Place 1 spray into both nostrils daily as needed for allergies.  Fluticasone-Salmeterol (ADVAIR) 250-50 MCG/DOSE AEPB Inhale 1 puff into the lungs daily.      furosemide (LASIX) 20 MG tablet Take 20 mg by mouth daily.      Insulin Syringe-Needle U-100 30G X 5/16" 1 ML MISC       LEVEMIR FLEXTOUCH 100 UNIT/ML FlexPen Inject 18 Units into the skin at bedtime.      losartan (COZAAR) 50 MG tablet Take 50 mg by mouth daily.      meclizine (ANTIVERT) 25 MG tablet Take 1 tablet (25 mg total) by mouth 3 (three) times daily as needed for  dizziness. 30 tablet 0    metFORMIN (GLUCOPHAGE-XR) 750 MG 24 hr tablet Take 750 mg by mouth daily.      metoprolol succinate (TOPROL-XL) 25 MG 24 hr tablet Take 25 mg by mouth daily.      Multiple Vitamins-Minerals (WOMENS MULTI VITAMIN & MINERAL PO) Take 1 tablet by mouth every morning.      nitroGLYCERIN (NITROSTAT) 0.4 MG SL tablet Place 1 tablet (0.4 mg total) under the tongue every 5 (five) minutes as needed for chest pain. 25 tablet 3    NOVOLOG FLEXPEN 100 UNIT/ML FlexPen Inject 5 Units into the skin 3 (three) times daily with meals.      pregabalin (LYRICA) 50 MG capsule Take 50 mg by mouth 3 (three) times daily.      traMADol (ULTRAM) 50 MG tablet Take 1 tablet (50 mg total) by mouth every 6 (six) hours as needed. 15 tablet 0    traZODone (DESYREL) 50 MG tablet TAKE 1/2 TO 1 TABLET AT BEDTIME AS NEEDED FOR SLEEP (Patient taking differently: Take 25 mg by mouth at bedtime as needed for sleep.) 90 tablet 1      Vitals   Vitals:   08/04/22 0100  Temp: 98.3 F (36.8 C)  TempSrc: Oral     There is no height or weight on file to calculate BMI.  Physical Exam   General: writhing in pain holding BL lower extremities, appears uncomfortable. Answers questions. HENT: Normal oropharynx and mucosa. Normal external appearance of ears and nose.  Neck: Supple, no pain or tenderness  CV: No JVD. No peripheral edema. Prolonged capillary refill in BL toes, unable to palpate dorsalis pedis or posterior tibialis in BL lower extremities. Pulmonary: Symmetric Chest rise. Normal respiratory effort.  Abdomen: Soft to touch, non-tender.  Ext: No cyanosis, edema, or deformity. BL lower ext cold to touch with ?mottling Skin: No rash. Normal palpation of skin.   Musculoskeletal: Normal digits and nails by inspection. No clubbing.   Neurologic Examination  Mental status/Cognition: Alert, oriented to self, place, month and year, good attention.  Speech/language: Fluent, comprehension intact, object  naming intact, repetition intact.  Cranial nerves:   CN II Pupils equal and reactive to light, no VF deficits    CN III,IV,VI EOM intact, no gaze preference or deviation, no nystagmus    CN V normal sensation in V1, V2, and V3 segments bilaterally    CN VII no asymmetry, no nasolabial fold flattening    CN VIII normal hearing to speech    CN IX & X normal palatal elevation, no uvular deviation    CN XI 5/5 head turn and 5/5 shoulder shrug bilaterally    CN XII midline tongue protrusion    Motor:  Muscle bulk: normal, tone normal Mvmt Root Nerve  Muscle Right Left Comments  SA C5/6 Ax Deltoid 5 5   EF C5/6 Mc Biceps 5 5  EE C6/7/8 Rad Triceps 5 5   WF C6/7 Med FCR     WE C7/8 PIN ECU     F Ab C8/T1 U ADM/FDI 5 5   HF L1/2/3 Fem Illopsoas 3 3 Limited BL lower ext evaluation secondary to significant pain.  KE L2/3/4 Fem Quad 2 2   DF L4/5 D Peron Tib Ant 1 1   PF S1/2 Tibial Grc/Sol 1 1    Sensation:  Light touch Intact BL   Pin prick    Temperature    Vibration   Proprioception    Coordination/Complex Motor:  - Finger to Nose intact bL with some intention tremor BL - Heel to shin unable to assess - Rapid alternating movement are intact in BL upper extremities. - Gait: Deferred.  Labs   CBC:  Recent Labs  Lab 08/03/22 2156  WBC 13.6*  HGB 14.1  HCT 43.1  MCV 89.6  PLT 466    Basic Metabolic Panel:  Lab Results  Component Value Date   NA 135 08/03/2022   K 3.3 (L) 08/03/2022   CO2 21 (L) 08/03/2022   GLUCOSE 454 (H) 08/03/2022   BUN 37 (H) 08/03/2022   CREATININE 1.26 (H) 08/03/2022   CALCIUM 9.6 08/03/2022   GFRNONAA 46 (L) 08/03/2022   GFRAA >60 06/20/2012   Lipid Panel: No results found for: "LDLCALC" HgbA1c:  Lab Results  Component Value Date   HGBA1C 12.0 (H) 07/02/2022   Urine Drug Screen: No results found for: "LABOPIA", "COCAINSCRNUR", "LABBENZ", "AMPHETMU", "THCU", "LABBARB"  Alcohol Level No results found for: "ETH"  CT Head without  contrast(Personally reviewed): 1. 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 No transforaminal or transtentorial cerebellar herniation. 2. Moderate senescent change.  CT angio Head and Neck with contrast(Personally reviewed): No aneurysms, no LVO, no spot sign.  MRI Brain(Personally reviewed): Pending  CT Run off(Personally reviewed): Pending   Impression   Heather [name] is a 71 y.o. female with PMH significant for smoker(2ppd), severe AI/AS s/p TAVR, CAD s/p PCI and on DAPT, COPD, PAD, HTN who presents with acute Cerebellar IPH with ICH volume of 11cc and ICH score of 1. She was given DDAVP at [facility].  In addition, endorsing BL lower extremities claudication at baseline with BL lower extremities pain at baseline with BL lower extremities weakness, pain, cold to touch with prolonged capillary refill and unable to palpate posterior tibialis or dorsalis pedis and high concern for BL lower extremities limb ischemia.  Recommendations  Cerbellar Vermis hemorrhage with ICH score of 1: - Admit to ICU - Stability scan in 6 hours or STAT with any neurological decline - Frequent neuro checks; q60mn for 1 hour, then q1hour - No antiplatelets or anticoagulants due to IMerriam- SCD for DVT prophylaxis, pharmacological DVT ppx at 24 hours if ICH is stable - Blood pressure control with goal systolic 1599- 1357 cleverplex and labetalol PRN - Stroke labs, HgbA1c, fasting lipid panel - MRI brain with and without contrast when stabilized to evaluate for underlying mass - CT Angio head and neck is negative for an underlying vascular abnormality, no aneurysms, no spot sign, no AVM, no LVO. - Risk factor modification - Echocardiogram - PT consult, OT consult, Speech consult. - Stroke team to follow  High suspicion for BL lower extremities limb ischemia in the setting of known PAD: - CT runoff pending. - Discussed  with Dr. CDonzetta Matterswith Vascular surgery team. -  PRN Fentanyl 77mg Q1 hour. Will need to be judicous with opiods. Can worsen mentation and neuro exam but poor pain control can make it harder to control her BP. If we do end up overdoing opiods, can also reverse with Narcan. - Keep her NPO for now.  Allergic to iodinated contrast: Patient reports breaking out into hives a couple days after contrast. Also has shellfish allergy which is independent of  - [facility] Benadryl '50mg'$ . - Spoke with Dr. PChrista Seewith rads given we wanted to give iodinated contrast again for CT Angio runoff. He did not feel like we need to repeat the prep again specially given threatened limb. Only been 2 hours since prep. Recommended giving Benadryl '50mg'$  again prior to CT runoff.  Smoker: - counseled her extensively on the importance of quitting smoking.  COPD: - cont home inhalers.  DM2: Sliding scale insulin  HLD: - cont home Atorvastatin  Insomnia: - cont Trazodone  GERD: - cont home Pepcid   FULL CODE, this was verified with patient. Would like her daughter DEra Bumpersto make medical decisions on her behalf if she is unable to make decision by herself.  Verified allergies and updated in the chart.  This patient is critically ill and at significant risk of neurological worsening, death and care requires constant monitoring of vital signs, hemodynamics,respiratory and cardiac monitoring, neurological assessment, discussion with family, other specialists and medical decision making of high complexity. I spent 90 minutes of neurocritical care time  in the care of  this patient. This was time spent independent of any time provided by nurse practitioner or PA.  SDonnetta SimpersTri[facility] Pager Number [phone]/06/2022  2:03 AM  ______________________________________________________________________   Thank you for the opportunity to take part in the care of  this patient. If you have any further questions, please contact the neurology consultation attending.  Signed,  SManliusPager Number [phone]_ _ _   _ __   _ __ _ _  __ __   _ __   __ _

## 2022-08-04 NOTE — Progress Notes (Signed)
ANTICOAGULATION CONSULT NOTE  Pharmacy Consult for Heparin Indication: Critical limb ischemia  Allergies  Allergen Reactions   Iodinated Contrast Media Hives   Sulfa Antibiotics Hives   Shellfish Allergy Rash and Other (See Comments)    Scallops specifically cause VOMITING      Patient Measurements: Height: 5' (152.4 cm) Weight: 58.7 kg (129 lb 6.6 oz) IBW/kg (Calculated) : 45.5 Heparin Dosing Weight: 57 kg  Vital Signs: Temp: 97.8 F (36.6 C) (12/11 1200) Temp Source: Oral (12/11 1200) BP: 103/68 (12/11 1900) Pulse Rate: 94 (12/11 1500)  Labs: Recent Labs    08/03/22 2156 08/04/22 0322 08/04/22 1114 08/04/22 1905  HGB 14.1  --  9.5*  --   HCT 43.1  --  28.0*  --   PLT 375  --   --   --   LABPROT 12.6  --   --   --   INR 1.0  --   --   --   HEPARINUNFRC  --   --   --  0.55  CREATININE 1.26* 1.13*  --   --   TROPONINIHS 15  --   --   --      Estimated Creatinine Clearance: 37.7 mL/min (A) (by C-G formula based on SCr of 1.13 mg/dL (H)).   Assessment: 67 YOF presenting with intracranial hemorrhage and critical limb ischemia requiring initiation of heparin for emergent thrombectomy/embolectomy. Past medical history: HFpEF, CAD s/p PCI on DAPT, severe AI/AS s/p TAVR, COPD, PAD, HTN. No anticoagulation PTA.   Despite high risk of worsening hemorrhage, CT shows stable ICH; thus, using neuro scale heparin with goal 0.3-0.5 with no bolusing per neurology.   Heparin level slightly above goal; no bleeding reported.  Goal of Therapy:  Heparin level 0.3-0.5 units/mL Monitor platelets by anticoagulation protocol: Yes   Plan:  Reduce heparin infusion to 650 units/hr  F/U AM labs  Rehema Muffley D. Mina Marble, PharmD, BCPS, [place] 08/04/2022, 7:53 PM

## 2022-08-04 NOTE — Anesthesia Postprocedure Evaluation (Signed)
Anesthesia Post Note  Patient: Heather [name]  Procedure(s) Performed: BILATERAL LOWER EXTREMITY THROMBECTOMY (Bilateral: Groin) LOWER EXTREMITY ANGIOGRAM (Bilateral) BILATERAL FEMORAL ENDARTERECTOMY (Bilateral: Groin) LEFT FEMORAL PATCH ANGIOPLASTY USING XENOSURE BIOLOGIC PATCH (Groin) INSERTION OF BILATERAL ILIAC STENTS (Bilateral: Groin) BILATERAL LOWER EXTREMITY FASCIECTOMIES (Bilateral: Leg Lower)     Patient location during evaluation: SICU Anesthesia Type: General Level of consciousness: sedated Pain management: pain level controlled Vital Signs Assessment: post-procedure vital signs reviewed and stable Respiratory status: patient remains intubated per anesthesia plan Cardiovascular status: stable Postop Assessment: no apparent nausea or vomiting Anesthetic complications: no Comments: Report given to Dr Halford Chessman  No notable events documented.  Last Vitals:  Vitals:   08/04/22 1400 08/04/22 1420  BP: 111/70   Pulse: 95   Resp: 16   Temp:    SpO2: 100% 100%    Last Pain:  Vitals:   08/04/22 1200  TempSrc: Oral                 Jason Frisbee

## 2022-08-04 NOTE — Progress Notes (Signed)
Patient emergently evaluated by vascular surgery. Worried about life threatening critical limb ischemia and needs emergent angiorgam and thrombectomy/embolectomy. Unfortunately, will require heparinization for the surgery which would significantly increase her risk of worsening acute ICH. This could be life threatening given the proximity of the ICH to the brainstem.  STAT repeat CTH obtained closer to 6 hours from the initial [facility] shows that [place] is stable in size. Despite high risk of worsening hemorrhage, given CT shows stable ICH, we should be able to use neuro scale heparin with goal heparin between 0.3-0.5 with no bolusing.  I again stopped by to discuss the complicated situation that Ms. Naoma Boxell is in and despite best efforts to prevent any harm, she is high risk for death from either worsening ICH or from critical life threatening limb ischemia. They understand the risks and are appreciative of the care that Ms. Mendel is getting.  [place] Pager Number [phone]

## 2022-08-04 NOTE — Anesthesia Preprocedure Evaluation (Addendum)
Anesthesia Evaluation  Patient identified by MRN, date of birth, ID band Patient awake    Reviewed: Allergy & Precautions, NPO status , Patient's Chart, lab work & pertinent test resultsPreop documentation limited or incomplete due to emergent nature of procedure.  Airway Mallampati: I  TM Distance: <3 FB Neck ROM: Full    Dental  (+) Edentulous Upper, Edentulous Lower   Pulmonary COPD,  COPD inhaler, Current Smoker    + decreased breath sounds+ wheezing      Cardiovascular hypertension, Pt. on home beta blockers and Pt. on medications + CAD, + Cardiac Stents, + Peripheral Vascular Disease and +CHF  + Valvular Problems/Murmurs AS  Rhythm:Regular Rate:Tachycardia  Echo: S/p Transcatheter aortic valve replacement (23 mm Edwards TAVR  prosthesis implanted 09/30/16)   Normal left ventricular systolic function, ejection fraction 60 to 90%   Diastolic dysfunction - grade I (normal filling pressures)   Degenerative mitral valve disease   Mitral annular calcification   Dilated left atrium - mild   Normal aortic prosthetic valve function   Prosthetic aortic valve paravalvular regurgitation - mild   Normal right ventricular systolic function      Neuro/Psych  PSYCHIATRIC DISORDERS Anxiety Depression Bipolar Disorder   CVA    GI/Hepatic Neg liver ROS,GERD  Medicated,,  Endo/Other  diabetes, Type 2, Insulin Dependent, Oral Hypoglycemic Agents    Renal/GU      Musculoskeletal   Abdominal   Peds  Hematology   Anesthesia Other Findings   Reproductive/Obstetrics                             Anesthesia Physical Anesthesia Plan  ASA: 4 and emergent  Anesthesia Plan: General   Post-op Pain Management: Ofirmev IV (intra-op)*   Induction: Intravenous  PONV Risk Score and Plan: 3 and Ondansetron and Treatment may vary due to age or medical condition  Airway Management Planned: Oral  ETT  Additional Equipment: Arterial line, Ultrasound Guidance Line Placement and CVP  Intra-op Plan:   Post-operative Plan: Possible Post-op intubation/ventilation  Informed Consent: I have reviewed the patients History and Physical, chart, labs and discussed the procedure including the risks, benefits and alternatives for the proposed anesthesia with the patient or authorized representative who has indicated his/her understanding and acceptance.       Plan Discussed with: CRNA  Anesthesia Plan Comments:        Anesthesia Quick Evaluation

## 2022-08-04 NOTE — Consult Note (Signed)
Hospital Consult    Reason for Consult: Acute bilateral lower extremity limb threatening ischemia Referring Physician: Dr. Lorrin Goodell MRN #:  [id number]  History of Present Illness This is a 71 y.o. female with extensive peripheral arterial disease history including stents of bilateral common iliac arteries and bilateral SFAs and a history of exploration of the left common femoral artery for repair of pseudoaneurysm.  She now presents with severe headache that began yesterday evening and was found to have hemorrhagic stroke.  She also described bilateral lower extremity pain with numbness and coolness.  CT angio was obtained and I have not been consulted.  In discussing with the patient she can really not feel from her knees down she has minimal motor of her bilateral feet and there is mottling that begins at the thigh level with the left thigh being colder than the right and there is worsened mottling down in the legs and the feet.  She has never had issues like this before.  Patient does not take blood thinners.  She states that she is on aspirin, Plavix and a statin.  She was given DDAVP at [facility].  Past Medical History:  Diagnosis Date   (HFpEF) heart failure with preserved ejection fraction (HCC)    Angina pectoris (HCC)    Anxiety    Aortic atherosclerosis (HCC)    Basal cell carcinoma    Bilateral carotid artery disease (HCC)    a.) carotid doppler 03/18/2021: 8-29% RICA, 56-21% LICA   Bipolar disorder (HCC)    Cervicalgia    Chronic mesenteric ischemia (HCC)    a.) s/p PTA 11/13/2020 --> 6 x 16 mm lifestream ballon expanding stent to SMA   COPD (chronic obstructive pulmonary disease) (HCC)    Coronary artery disease 03/20/2014   a.) LHC/PCI 03/20/2014: 70% mLAD (2.5 x 28 mm Xience Alpine DES), 70% dLCx, 70% dRCA; b.) LHC 01/25/2016: 90% ISR mLAD --> mLAD dissection with in stent --> 2.75 x 22 mm Resolute DES; c.) R/LHC 09/29/2016: 50% ISR mLAD, 70% dLAD, mPA 37, PCWP 24, AO sat  90%, PA sat 46%, CO 3.7, CI 2.46 - med mgmt   DDD (degenerative disc disease), cervical    Depression    Diabetic polyneuropathy (HCC)    GERD (gastroesophageal reflux disease)    Hyperlipidemia    Hypertension    Long term current use of antithrombotics/antiplatelets    a.) on DAPT (ASA + clopidogrel)   Memory loss    NSTEMI (non-ST elevated myocardial infarction) ([place]) 07/2016   a.) troponins trended (normal < 0.034 ng/mL): 0.325 --> 1.480 --> 2.330 --> 1.660 --> 0.169 ng/mL   PAD (peripheral artery disease) ([place])    a.) s/p PTA and stenting SMA 11/13/2020; b.) s/p PTA and stenting of RIGHT SFA and popliteal 04/12/2021; c.) s/p PTA and kissing balloon stents to BILATERAL CIAs 05/28/2021   Respiratory failure with hypoxia (HCC)    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   a.) 23 mm Sapien 3 via suprasternal approach   Schizophrenia ([place])    Severe aortic stenosis    a.) s/p TAVR 09/30/2016; 23 mm Sapien 3 via a suprasternal approach   Type 2 diabetes mellitus treated with insulin ([place])    Vertigo     Past Surgical History:  Procedure Laterality Date   AORTIC VALVE REPLACEMENT  09/30/2016   Procedure: TRANSCATHETER AORTIC VALVE REPAIR   BLADDER SURGERY     CATARACT EXTRACTION, BILATERAL     CHOLECYSTECTOMY     COLONOSCOPY  CORONARY ANGIOPLASTY WITH STENT PLACEMENT  03/20/2014   Procedure: CORONARY ANGIOPLASTY WITH STENT PLACEMENT; Location: [facility]; Surgeon: Jacques Earthly, MD   CORONARY ANGIOPLASTY WITH STENT PLACEMENT Left 01/25/2016   Procedure: CORONARY ANGIOPLASTY WITH STENT PLACEMENT; Location: [facility]; Surgeon: Jacques Earthly, MD   FOOT SURGERY Right    HEMORRHOID SURGERY     IRRIGATION AND DEBRIDEMENT HEMATOMA Left 07/08/2014   GROIN   LOWER EXTREMITY ANGIOGRAPHY Right 04/12/2021   Procedure: Lower Extremity Angiography;  Surgeon: [name], MD;  Location: [place] [facility];  Service: Cardiovascular;  Laterality: Right;   LOWER EXTREMITY  ANGIOGRAPHY Left 05/28/2021   Procedure: LOWER EXTREMITY ANGIOGRAPHY;  Surgeon: [name], MD;  Location: [place] [facility];  Service: Cardiovascular;  Laterality: Left;   LOWER EXTREMITY ANGIOGRAPHY Left 03/25/2022   Procedure: Lower Extremity Angiography;  Surgeon: [name], MD;  Location: [place] [facility];  Service: Cardiovascular;  Laterality: Left;   PSEUDOANEURYSM REPAIR Left 06/30/2014   FEMORAL ARTERY   RIGHT/LEFT HEART CATH AND CORONARY ANGIOGRAPHY Bilateral 09/29/2016   Procedure: RIGHT/LEFT HEART CATH AND CORONARY ANGIOGRAPHY; Location [facility]; Surgeon: Jacques Earthly, MD   TUBAL LIGATION     VISCERAL ANGIOGRAPHY N/A 11/13/2020   Procedure: VISCERAL ANGIOGRAPHY;  Surgeon: [name], MD;  Location: [place] [facility];  Service: Cardiovascular;  Laterality: N/A;    Allergies  Allergen Reactions   Iodinated Contrast Media Hives   Sulfa Antibiotics Hives   Shellfish Allergy Rash and Other (See Comments)    Scallops specifically cause VOMITING Scallops specifically cause VOMITING     Prior to Admission medications   Medication Sig Start Date End Date Taking? Authorizing Provider  albuterol (VENTOLIN HFA) 108 (90 Base) MCG/ACT inhaler Inhale 2 puffs into the lungs every 4 (four) hours as needed for wheezing or shortness of breath. 03/09/14   [provider]  aspirin EC 81 MG tablet Take 81 mg by mouth daily.    [provider]  atorvastatin (LIPITOR) 80 MG tablet Take 80 mg by mouth at bedtime. 08/28/18   [provider]  cholecalciferol (VITAMIN D) 1000 units tablet Take 1,000 Units by mouth daily.    [provider]  clopidogrel (PLAVIX) 75 MG tablet Take 1 tablet (75 mg total) by mouth daily. 11/15/20   Regalado, Belkys A, MD  diphenhydrAMINE (BENADRYL) 25 mg capsule Take 25 mg by mouth every 8 (eight) hours as needed.    [provider]  escitalopram (LEXAPRO) 20 MG tablet Take 1 tablet (20 mg total) by  mouth daily. 01/16/20   Ursula Alert, MD  famotidine (PEPCID) 20 MG tablet Take 20 mg by mouth 2 (two) times daily as needed for heartburn or indigestion.    [provider]  fluticasone (FLONASE) 50 MCG/ACT nasal spray Place 1 spray into both nostrils daily as needed for allergies. 11/02/16   [provider]  Fluticasone-Salmeterol (ADVAIR) 250-50 MCG/DOSE AEPB Inhale 1 puff into the lungs daily. 10/02/16   [provider]  furosemide (LASIX) 20 MG tablet Take 20 mg by mouth daily. 08/28/18   [provider]  Insulin Syringe-Needle U-100 30G X 5/16" 1 ML MISC  02/19/16   [provider]  LEVEMIR FLEXTOUCH 100 UNIT/ML FlexPen Inject 18 Units into the skin at bedtime. 11/22/20   [provider]  losartan (COZAAR) 50 MG tablet Take 50 mg by mouth daily. 09/07/20   [provider]  meclizine (ANTIVERT) 25 MG tablet Take 1 tablet (25 mg total) by  mouth 3 (three) times daily as needed for dizziness. 04/05/22   Naaman Plummer, MD  metFORMIN (GLUCOPHAGE-XR) 750 MG 24 hr tablet Take 750 mg by mouth daily. 05/19/22   [provider]  metoprolol succinate (TOPROL-XL) 25 MG 24 hr tablet Take 25 mg by mouth daily. 10/02/18   [provider]  Multiple Vitamins-Minerals (WOMENS MULTI VITAMIN & MINERAL PO) Take 1 tablet by mouth every morning.    [provider]  nitroGLYCERIN (NITROSTAT) 0.4 MG SL tablet Place 1 tablet (0.4 mg total) under the tongue every 5 (five) minutes as needed for chest pain. 06/09/22   Gollan, Kathlene November, MD  NOVOLOG FLEXPEN 100 UNIT/ML FlexPen Inject 5 Units into the skin 3 (three) times daily with meals. 04/07/22   [provider]  pregabalin (LYRICA) 50 MG capsule Take 50 mg by mouth 3 (three) times daily.    [provider]  traMADol (ULTRAM) 50 MG tablet Take 1 tablet (50 mg total) by mouth every 6 (six) hours as needed. 03/19/22   Kris Hartmann, NP  traZODone (DESYREL) 50 MG tablet TAKE  1/2 TO 1 TABLET AT BEDTIME AS NEEDED FOR SLEEP Patient taking differently: Take 25 mg by mouth at bedtime as needed for sleep. 03/06/21   Ursula Alert, MD    Social History   Socioeconomic History   Marital status: Married    Spouse name: [name]   Number of children: 2   Years of education: Not on file   Highest education level: High school graduate  Occupational History   Not on file  Tobacco Use   Smoking status: Every Day    Packs/day: 1.50    Years: 48.00    Total pack years: 72.00    Types: Cigarettes   Smokeless tobacco: Never  Vaping Use   Vaping Use: Never used  Substance and Sexual Activity   Alcohol use: Not Currently   Drug use: Not Currently   Sexual activity: Not Currently  Other Topics Concern   Not on file  Social History Narrative   Lives at home with [employer]   Social Determinants of Health   Financial Resource Strain: Medium Risk (10/19/2018)   Overall Financial Resource Strain (CARDIA)    Difficulty of Paying Living Expenses: Somewhat hard  Food Insecurity: Food Insecurity Present (10/19/2018)   Hunger Vital Sign    Worried About [place] in the Last Year: Often true    Ran Out of Food in the Last Year: Often true  Transportation Needs: No Transportation Needs (10/19/2018)   PRAPARE - Hydrologist (Medical): No    Lack of Transportation (Non-Medical): No  Physical Activity: Inactive (10/19/2018)   Exercise Vital Sign    Days of Exercise per Week: 0 days    Minutes of Exercise per Session: 0 min  Stress: Not on file  Social Connections: Unknown (10/19/2018)   Social Connection and Isolation Panel [NHANES]    Frequency of Communication with Friends and Family: Not on file    Frequency of Social Gatherings with Friends and Family: Not on file    Attends Religious Services: More than 4 times per year    Active Member of [employer] or Organizations: No    Attends Archivist Meetings: Never    Marital Status:  Married  Human resources officer Violence: Not At Risk (10/19/2018)   Humiliation, Afraid, Rape, and Kick questionnaire    Fear of Current or Ex-Partner: No    Emotionally  Abused: No    Physically Abused: No    Sexually Abused: No     Family History  Problem Relation Age of Onset   Mental illness Mother    Breast cancer Neg Hx     Review of Systems  Constitutional: Negative.   HENT: Negative.    Eyes: Negative.   Respiratory: Negative.    Cardiovascular: Negative.   Genitourinary:        Recent infection  Musculoskeletal:        Leg pain  Skin: Negative.   Neurological:  Positive for weakness and headaches.       Bilateral lower extremities are numb      Physical Examination  Vitals:   08/04/22 0230 08/04/22 0245  BP: (!) 146/72 (!) 168/60  Pulse: (!) 120 (!) 123  Resp: 19 (!) 22  Temp:    SpO2: 92% 92%   There is no height or weight on file to calculate BMI.  Physical Exam Constitutional:      Comments: Holding her head due to headache  HENT:     Head: Normocephalic.     Nose: Nose normal.     Mouth/Throat:     Mouth: Mucous membranes are moist.  Cardiovascular:     Pulses:          Radial pulses are 2+ on the right side and 0 on the left side.       Femoral pulses are 0 on the right side and 0 on the left side.      Popliteal pulses are 0 on the right side and 0 on the left side.       Dorsalis pedis pulses are 0 on the right side and 0 on the left side.  Pulmonary:     Effort: Pulmonary effort is normal.  Musculoskeletal:     Right lower leg: No edema.     Left lower leg: No edema.  Skin:    Capillary Refill: Mottling of the bilateral feet Neurological:     Mental Status: She is alert.     Comments: Patient is lacking sensation up to about the level of her knees and she cannot move her feet and is very weak bending her knees      CBC    Component Value Date/Time   WBC 13.6 (H) 08/03/2022 2156   RBC 4.81 08/03/2022 2156   HGB 14.1 08/03/2022  2156   HGB 14.3 06/20/2012 2127   HCT 43.1 08/03/2022 2156   HCT 42.9 06/20/2012 2127   PLT 375 08/03/2022 2156   PLT 357 06/20/2012 2127   MCV 89.6 08/03/2022 2156   MCV 91 06/20/2012 2127   MCH 29.3 08/03/2022 2156   MCHC 32.7 08/03/2022 2156   RDW 14.4 08/03/2022 2156   RDW 13.6 06/20/2012 2127   LYMPHSABS 1.8 11/15/2020 0539   MONOABS 0.8 11/15/2020 0539   EOSABS 0.4 11/15/2020 0539   BASOSABS 0.0 11/15/2020 0539    BMET    Component Value Date/Time   NA 135 08/03/2022 2156   NA 135 (L) 06/20/2012 2127   K 3.3 (L) 08/03/2022 2156   K 4.0 06/20/2012 2127   CL 100 08/03/2022 2156   CL 100 06/20/2012 2127   CO2 21 (L) 08/03/2022 2156   CO2 27 06/20/2012 2127   GLUCOSE 454 (H) 08/03/2022 2156   GLUCOSE 250 (H) 06/20/2012 2127   BUN 37 (H) 08/03/2022 2156   BUN 20 (H) 06/20/2012 2127   CREATININE 1.26 (H) 08/03/2022  2156   CREATININE 0.60 06/20/2012 2127   CALCIUM 9.6 08/03/2022 2156   CALCIUM 8.8 06/20/2012 2127   GFRNONAA 46 (L) 08/03/2022 2156   GFRNONAA >60 06/20/2012 2127   GFRAA >60 06/20/2012 2127    COAGS: Lab Results  Component Value Date   INR 1.0 08/03/2022     Non-Invasive Vascular Imaging:   CTA and angiogram from August both reviewed   ASSESSMENT/PLAN: 71 y.o. female presents with hemorrhagic stroke and found to have bilateral lower extremity acute limb ischemia [place] 2B with sensory and motor loss.  I discussed with the patient, extended family at bedside and neurologist that this represents a life-threatening insult with occluded bilateral common iliac artery stents with known bilateral common femoral disease and what appears to be occluded SFA stent on the left possibly patent on the right but with mottling bilaterally in her feet.  We discussed proceeding with surgery which would require heparinization in the setting of hemorrhagic stroke and the goal to reestablish blood flow to at least the groin level with bilateral common femoral  endarterectomies and retrograde embolectomy of the bilateral common and external iliac arteries and if this is unsuccessful she would require axillary to bifemoral bypass from the right side given that she has a palpable pulse on the right and not the left.  I discussed that we will attempt thrombectomy of the bilateral infrainguinal lower extremities but that we may be unsuccessful in reestablishing blood flow all the way to her feet and ultimately this procedure may be an effort to heal bilateral above-knee amputations.  We have discussed that with heparinization she is incredibly high risk for worsen hemorrhagic stroke which could lead to possible death.  Family and patient are in agreement to proceed with surgical intervention to attempt reestablishment of blood flow to the bilateral lower extremities using heparin cautiously.  She is to go for stat CT of the head first and then we will plan for surgical intervention.  Yoceline Bazar C. Donzetta Matters, MD [facility] Office: [phone] Pager: [phone]

## 2022-08-04 NOTE — Progress Notes (Signed)
Date and time results received: 08/04/22 5:06 AM   Test: Glucose Critical Value: 589  Name of Provider Notified: Sal  Orders Received? Or Actions Taken?:  Orders received from prior CBG but unable to give since patient was emergently taken to the OR. Will attempt to notify CRNA.

## 2022-08-05 ENCOUNTER — Ambulatory Visit: Payer: Medicare HMO

## 2022-08-05 ENCOUNTER — Inpatient Hospital Stay (HOSPITAL_COMMUNITY): Payer: Medicare HMO

## 2022-08-05 DIAGNOSIS — I614 Nontraumatic intracerebral hemorrhage in cerebellum: Secondary | ICD-10-CM | POA: Diagnosis not present

## 2022-08-05 DIAGNOSIS — J9601 Acute respiratory failure with hypoxia: Secondary | ICD-10-CM

## 2022-08-05 DIAGNOSIS — I7025 Atherosclerosis of native arteries of other extremities with ulceration: Secondary | ICD-10-CM | POA: Diagnosis not present

## 2022-08-05 DIAGNOSIS — J9602 Acute respiratory failure with hypercapnia: Secondary | ICD-10-CM

## 2022-08-05 DIAGNOSIS — L97509 Non-pressure chronic ulcer of other part of unspecified foot with unspecified severity: Secondary | ICD-10-CM | POA: Diagnosis not present

## 2022-08-05 DIAGNOSIS — J988 Other specified respiratory disorders: Secondary | ICD-10-CM | POA: Diagnosis not present

## 2022-08-05 LAB — BASIC METABOLIC PANEL
Anion gap: 13 (ref 5–15)
Anion gap: 9 (ref 5–15)
BUN: 38 mg/dL — ABNORMAL HIGH (ref 8–23)
BUN: 41 mg/dL — ABNORMAL HIGH (ref 8–23)
CO2: 20 mmol/L — ABNORMAL LOW (ref 22–32)
CO2: 18 mmol/L — ABNORMAL LOW (ref 22–32)
Calcium: 8.1 mg/dL — ABNORMAL LOW (ref 8.9–10.3)
Calcium: 6.9 mg/dL — ABNORMAL LOW (ref 8.9–10.3)
Chloride: 107 mmol/L (ref 98–111)
Chloride: 112 mmol/L — ABNORMAL HIGH (ref 98–111)
Creatinine, Ser: 1.53 mg/dL — ABNORMAL HIGH (ref 0.44–1.00)
Creatinine, Ser: 1.89 mg/dL — ABNORMAL HIGH (ref 0.44–1.00)
GFR, Estimated: 37 mL/min — ABNORMAL LOW (ref >60)
GFR, Estimated: 28 mL/min — ABNORMAL LOW (ref >60)
Glucose, Bld: 218 mg/dL — ABNORMAL HIGH (ref 70–99)
Glucose, Bld: 126 mg/dL — ABNORMAL HIGH (ref 70–99)
Potassium: 3.5 mmol/L (ref 3.5–5.1)
Potassium: 4.1 mmol/L (ref 3.5–5.1)
Sodium: 140 mmol/L (ref 135–145)
Sodium: 139 mmol/L (ref 135–145)

## 2022-08-05 LAB — CBC
HCT: 24.1 % — ABNORMAL LOW (ref 36.0–46.0)
Hemoglobin: 7.8 g/dL — ABNORMAL LOW (ref 12.0–15.0)
MCH: 31 pg (ref 26.0–34.0)
MCHC: 32.4 g/dL (ref 30.0–36.0)
MCV: 95.6 fL (ref 80.0–100.0)
Platelets: 253 10*3/uL (ref 150–400)
RBC: 2.52 MIL/uL — ABNORMAL LOW (ref 3.87–5.11)
RDW: 13.9 % (ref 11.5–15.5)
WBC: 18.6 10*3/uL — ABNORMAL HIGH (ref 4.0–10.5)
nRBC: 0 % (ref 0.0–0.2)

## 2022-08-05 LAB — GLUCOSE, CAPILLARY
Glucose-Capillary: 208 mg/dL — ABNORMAL HIGH (ref 70–99)
Glucose-Capillary: 215 mg/dL — ABNORMAL HIGH (ref 70–99)
Glucose-Capillary: 253 mg/dL — ABNORMAL HIGH (ref 70–99)
Glucose-Capillary: 177 mg/dL — ABNORMAL HIGH (ref 70–99)
Glucose-Capillary: 138 mg/dL — ABNORMAL HIGH (ref 70–99)
Glucose-Capillary: 158 mg/dL — ABNORMAL HIGH (ref 70–99)

## 2022-08-05 LAB — HEPARIN LEVEL (UNFRACTIONATED)
Heparin Unfractionated: 0.47 IU/mL (ref 0.30–0.70)
Heparin Unfractionated: 0.36 IU/mL (ref 0.30–0.70)

## 2022-08-05 LAB — LIPID PANEL
Cholesterol: 96 mg/dL (ref 0–200)
HDL: 25 mg/dL — ABNORMAL LOW (ref >40)
LDL Cholesterol: UNDETERMINED mg/dL (ref 0–99)
Total CHOL/HDL Ratio: 3.8 RATIO
Triglycerides: 531 mg/dL — ABNORMAL HIGH (ref <150)
VLDL: UNDETERMINED mg/dL (ref 0–40)

## 2022-08-05 LAB — HEMOGLOBIN A1C
Hgb A1c MFr Bld: 12.9 % — ABNORMAL HIGH (ref 4.8–5.6)
Mean Plasma Glucose: 324 mg/dL

## 2022-08-05 LAB — TRIGLYCERIDES: Triglycerides: 535 mg/dL — ABNORMAL HIGH (ref <150)

## 2022-08-05 LAB — LDL CHOLESTEROL, DIRECT: Direct LDL: 34 mg/dL (ref 0–99)

## 2022-08-05 LAB — CK: Total CK: 11322 U/L — ABNORMAL HIGH (ref 38–234)

## 2022-08-05 LAB — MAGNESIUM: Magnesium: 2.3 mg/dL (ref 1.7–2.4)

## 2022-08-05 LAB — LACTIC ACID, PLASMA: Lactic Acid, Venous: 1.2 mmol/L (ref 0.5–1.9)

## 2022-08-05 MED ORDER — FUROSEMIDE 10 MG/ML IJ SOLN
20.0000 mg | Freq: Once | INTRAMUSCULAR | Status: AC
  Administered 2022-08-05: 20 mg via INTRAVENOUS
  Filled 2022-08-05: qty 2

## 2022-08-05 MED ORDER — NICARDIPINE HCL IN NACL 20-0.86 MG/200ML-% IV SOLN: 0.0000 mg/h | INTRAVENOUS | Status: DC

## 2022-08-05 MED ORDER — LACTATED RINGERS IV BOLUS
1000.0000 mL | Freq: Once | INTRAVENOUS | Status: AC
  Administered 2022-08-05: 1000 mL via INTRAVENOUS

## 2022-08-05 MED ORDER — DEXMEDETOMIDINE HCL IN NACL 400 MCG/100ML IV SOLN
0.0000 ug/kg/h | INTRAVENOUS | Status: DC
  Administered 2022-08-05: 0.4 ug/kg/h via INTRAVENOUS
  Administered 2022-08-05: 0.5 ug/kg/h via INTRAVENOUS
  Administered 2022-08-06: 0.4 ug/kg/h via INTRAVENOUS
  Administered 2022-08-06: 0.6 ug/kg/h via INTRAVENOUS
  Filled 2022-08-05 (×5): qty 100

## 2022-08-05 MED ORDER — SODIUM CHLORIDE 0.9 % IV BOLUS
500.0000 mL | Freq: Once | INTRAVENOUS | Status: AC
  Administered 2022-08-05: 500 mL via INTRAVENOUS

## 2022-08-05 MED ORDER — INSULIN ASPART 100 UNIT/ML IJ SOLN
0.0000 [IU] | INTRAMUSCULAR | Status: DC
  Administered 2022-08-05: 1 [IU] via SUBCUTANEOUS
  Administered 2022-08-05: 5 [IU] via SUBCUTANEOUS
  Administered 2022-08-05: 2 [IU] via SUBCUTANEOUS
  Administered 2022-08-05: 3 [IU] via SUBCUTANEOUS
  Administered 2022-08-06: 2 [IU] via SUBCUTANEOUS
  Administered 2022-08-06: 9 [IU] via SUBCUTANEOUS
  Administered 2022-08-06: 5 [IU] via SUBCUTANEOUS
  Administered 2022-08-06: 2 [IU] via SUBCUTANEOUS

## 2022-08-05 MED ORDER — METOPROLOL TARTRATE 25 MG/10 ML ORAL SUSPENSION
25.0000 mg | Freq: Two times a day (BID) | ORAL | Status: DC
  Administered 2022-08-05: 25 mg
  Filled 2022-08-05: qty 10

## 2022-08-05 MED ORDER — FENTANYL 2500MCG IN NS 250ML (10MCG/ML) PREMIX INFUSION
25.0000 ug/h | INTRAVENOUS | Status: DC
  Administered 2022-08-05: 25 ug/h via INTRAVENOUS
  Administered 2022-08-06: 75 ug/h via INTRAVENOUS
  Filled 2022-08-05 (×3): qty 250

## 2022-08-05 MED ORDER — FENTANYL BOLUS VIA INFUSION: 25.0000 ug | INTRAVENOUS | Status: DC | PRN

## 2022-08-05 MED ORDER — PIVOT 1.5 CAL PO LIQD
1000.0000 mL | ORAL | Status: DC
  Administered 2022-08-06 – 2022-08-07 (×2): 1000 mL
  Filled 2022-08-05 (×2): qty 1000

## 2022-08-05 MED ORDER — SODIUM CHLORIDE 0.9 % IV BOLUS
1000.0000 mL | Freq: Once | INTRAVENOUS | Status: AC
  Administered 2022-08-05: 1000 mL via INTRAVENOUS

## 2022-08-05 MED ORDER — PIVOT 1.5 CAL PO LIQD: 1000.0000 mL | ORAL | Status: DC

## 2022-08-05 MED ORDER — VITAL HIGH PROTEIN PO LIQD: 1000.0000 mL | ORAL | Status: AC

## 2022-08-05 NOTE — Progress Notes (Signed)
OT Cancellation Note  Patient Details Name: Heather [name] MRN: [id number] DOB: May 15, 1951   Cancelled Treatment:    Reason Eval/Treat Not Completed: Patient not medically ready (intubated/sedated, pending decision on amputation of LLE) OT will continue to follow for medical readiness for therapy evaluation.   [place] 08/05/2022, 9:23 AM  Jesse Sans OTR/L [place] Office: [phone]

## 2022-08-05 NOTE — Progress Notes (Signed)
PT Cancellation Note  Patient Details Name: Heather [name] MRN: [id number] DOB: 05-03-1951   Cancelled Treatment:    Reason Eval/Treat Not Completed: Patient not medically ready.  Intubated and sedated, not appropriate today per RN. 08/05/2022  Ginger Carne., PT Acute Rehabilitation Services [phone]  (office)   Tessie Fass Hurley Sobel 08/05/2022, 10:46 AM

## 2022-08-05 NOTE — Progress Notes (Addendum)
[place] Progress Note Patient Name: Heather [name] DOB: 1950/10/02 MRN: [id number]   Date of Service  08/05/2022  HPI/Events of Note  Oliguria - Urine output only 90 mL on day shift and only 48 mL so far this shift. LVEF = 60-65%. No WMA.  eICU Interventions  Plan: LR 1 liter IV over 1 hour now.  Monitor CVP now and Q 4 hours.      Intervention Category Major Interventions: Other:  Lysle Dingwall 08/05/2022, 10:10 PM

## 2022-08-05 NOTE — Progress Notes (Signed)
Dr. Halford Chessman notified of pt MAP of 57 and ART bp of 108/39. 1L NS bolus ordered.  Justice Rocher, RN

## 2022-08-05 NOTE — Progress Notes (Signed)
Initial Nutrition Assessment  DOCUMENTATION CODES:   Not applicable  INTERVENTION:    Tube feeding via Cortrak tube:  D/C Vital High Protein  Pivot 1.5 at 50 ml/h (1200 ml per day)  Provides 1800 kcal, 112 gm protein, 912 ml free water daily  Continue MVI with minerals daily   NUTRITION DIAGNOSIS:   Increased nutrient needs related to wound healing as evidenced by estimated needs.  GOAL:   Patient will meet greater than or equal to 90% of their needs  MONITOR:   TF tolerance  REASON FOR ASSESSMENT:   Consult Enteral/tube feeding initiation and management  ASSESSMENT:   Pt with PMH of CHF, anxiety, PAD, bipolar, COPD, CAD, depression, DM with neuropathy, GERD, HTN, HLD, schizophrenia admitted with acute ICH and critical limb ischemia.    Pt discussed during ICU rounds and with RN. [place] with MD to advance TF today, no plans for OR today.  Per vascular pt will need L AKA in coming days   Spoke with daughter who reports that pt has had no recent weight changes. Pt does not eat big meals and is more of a snacker. Likes to eat nabs. Per daughter pt does not attempt to get blood sugar under control.   12/11 - s/p b/l common femoral endarterectomy, iliofemoral endarterectomy, lower extremity embolectomy and stent of b/l common iliac arteries and Rt external iliac artery, and b/l 4 compartment fasciotomies  S/p cortrak placement; per xray tip in distal stomach or proximal duodenum   Medications reviewed and include: colace, SSI, MVI with minerals, protonix, miralax, senokot-s  Cleviprex @ 56 ml/hr provides: 2688 kcal from lipid  Precedex Fentanyl    Labs reviewed:  A1C: 12 (07/02/22)  CBG's: 166-215  UOP: 855 ml   NUTRITION - FOCUSED PHYSICAL EXAM:  Flowsheet Row Most Recent Value  Orbital Region No depletion  Upper Arm Region No depletion  Thoracic and Lumbar Region No depletion  Buccal Region No depletion  Temple Region No depletion  Clavicle Bone Region No  depletion  Clavicle and Acromion Bone Region No depletion  Scapular Bone Region No depletion  Dorsal Hand No depletion  Patellar Region Mild depletion  Anterior Thigh Region Mild depletion  Posterior Calf Region Unable to assess  Edema (RD Assessment) None  Hair Reviewed  Eyes Unable to assess  Mouth Reviewed  [edentulous]  Skin Reviewed  Nails Reviewed       Diet Order:   Diet Order             Diet NPO time specified  Diet effective midnight                   EDUCATION NEEDS:      Skin:  Skin Assessment: Skin Integrity Issues: (bilateral fasciotomies) Skin Integrity Issues:: Stage II Stage II: sacrum  Last BM:  unknown  Height:   Ht Readings from Last 1 Encounters:  08/04/22 5' (1.524 m)    Weight:   Wt Readings from Last 1 Encounters:  08/04/22 58.7 kg    BMI:  Body mass index is 25.27 kg/m.  Estimated Nutritional Needs:   Kcal:  1700-1900  Protein:  100-125 grams  Fluid:  >1.7 L/day

## 2022-08-05 NOTE — Consult Note (Signed)
NAME:  Heather [name], MRN:  [id number], DOB:  [birth year], LOS: 1 ADMISSION DATE:  08/04/2022, CONSULTATION DATE:  08/04/2022 REFERRING MD:  Stroke team, CHIEF COMPLAINT:  Headache  History of Present Illness:  71 yo female smoker presented to ER with sudden onset of dizziness, blurred vision, headache, nausea and vomiting.  BP in EMS 209/76.  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  Also found to have bilateral leg pain with numbness and coolness.  Found to have occlusion of b/l common iliac artery stents, Lt SFA and popliteal stent and vascular surgery consulted.  Taken to OR for b/l common femoral endarterectomy, iliofemoral endarterectomy, lower extremity embolectomy and stent of b/l common iliac arteries and Rt external iliac artery, and b/l 4 compartment fasciotomies.  Remained on vent and cleviprex post op.  PCCM consulted to assist with critical care management.  Pertinent  Medical History  HFpEF, Anxiety, PAD, Bipolar, COPD, CAD, Depression, DM with neuropathy, GERD, HLD. HTN, s/p TAVR, Schizophrenia, Vertigo  Significant Hospital Events: Including procedures, antibiotic start and stop dates in addition to other pertinent events   12/11 admit, vascular surgery consulted >> to OR 12/12 off insulin gtt  Studies:  CT head 12/10 >> acute ICH 11 cc volume in cerebellar vermis MRI brain 12/11 >> Unchanged size of intraparenchymal hematoma centered at the dentate nucleus of the left cerebellum with extension into the fourth ventricle. No hydrocephalus. Echo 12/11 >> EF 60 to 65%, mild MS, s/p TAVR CT head 12/12 >> No progression of the cerebellar hematoma. No hydrocephalus.   Interim History / Subjective:  Remains on vent, cleviprex.  Objective   Blood pressure (!) 96/58, pulse (!) 116, temperature 99.2 F (37.3 C), temperature source Axillary, resp. rate (!) 22, height 5' (1.524 m), weight 58.7 kg, SpO2 100 %.    Vent Mode: PRVC FiO2 (%):  [40 %-100 %] 40  % Set Rate:  [18 bmp] 18 bmp Vt Set:  [360 mL] 360 mL PEEP:  [5 cmH20] 5 cmH20 Plateau Pressure:  [11 cmH20-12 cmH20] 12 cmH20   Intake/Output Summary (Last 24 hours) at 08/05/2022 0735 Last data filed at 08/05/2022 0700 Gross per 24 hour  Intake 2879.79 ml  Output 1005 ml  Net 1874.79 ml   Filed Weights   08/04/22 1000  Weight: 58.7 kg    Examination:  General - sedated Eyes - pupils reactive ENT - ETT in place Cardiac - regular, tachycardic Chest - equal breath sounds b/l, no wheezing or rales Abdomen - soft, non tender, + bowel sounds Extremities - Lt foot cool Skin - no rashes Neuro - RASS -1  Resolved Hospital Problem list     Assessment & Plan:   Critical limb ischemia s/p b/l common femoral endarterectomy, iliofemoral endarterectomy, lower extremity embolectomy and stent of b/l common iliac arteries and Rt external iliac artery, and b/l 4 compartment fasciotomies. - post op care per vascular surgery - likely will need amputation of Lt lower leg - continue heparin gtt for now  ICH of cerebellar vermis. Sedation needs while on vent. Hx of depression, bipolar, schizophrenia, insomnia. - no progression of bleed on neuro-imaging - neurology following - elevated triglyceride level >> change diprivan to precedex and fentanyl with prn versed for RASS goal 0 to -1 - hold outpt lexapro, trazadone  HTN emergency. Hx of CAD, chronic HFpEF, HLD, HTN, s/p TAVR. - cleviprex for goal SBP 130 to 150 - lipitor - resume outpt lopressor - hold outpt lasix, ASA,  plavix, cozaar  Compromised airway. Hx of COPD with tobacco abuse. - full vent support - goal SpO2 > 92% - f/u CXR - yupelri, pulmicort, brovana with prn albuterol - nicotine patch  DM type 2 poorly controlled with hyperglycemia. DM neuropathy. - SSI - hold outpt lyriva, metformin  Anemia of critical illness. - f/u CBC - transfuse for Hb < 7  Best Practice (right click and "Reselect all SmartList  Selections" daily)   Diet/type: NPO DVT prophylaxis: systemic heparin GI prophylaxis: H2B Lines: Central line Foley:  Yes, and it is still needed Code Status:  full code Last date of multidisciplinary goals of care discussion '[x]'$   Labs       Latest Ref Rng & Units 08/05/2022    4:00 AM 08/04/2022   11:14 AM 08/04/2022    6:48 AM  CMP  Glucose 70 - 99 mg/dL 218   520   BUN 8 - 23 mg/dL 38   25   Creatinine 0.44 - 1.00 mg/dL 1.53   0.60   Sodium 135 - 145 mmol/L 140  143  142   Potassium 3.5 - 5.1 mmol/L 3.5  3.5  3.2   Chloride 98 - 111 mmol/L 107   109   CO2 22 - 32 mmol/L 20     Calcium 8.9 - 10.3 mg/dL 8.1          Latest Ref Rng & Units 08/05/2022    4:00 AM 08/04/2022   11:14 AM 08/04/2022    6:48 AM  CBC  WBC 4.0 - 10.5 K/uL 18.6     Hemoglobin 12.0 - 15.0 g/dL 7.8  9.5  11.6   Hematocrit 36.0 - 46.0 % 24.1  28.0  34.0   Platelets 150 - 400 K/uL 253       ABG    Component Value Date/Time   PHART 7.256 (L) 08/04/2022 1114   PCO2ART 44.1 08/04/2022 1114   PO2ART 227 (H) 08/04/2022 1114   HCO3 19.6 (L) 08/04/2022 1114   TCO2 21 (L) 08/04/2022 1114   ACIDBASEDEF 7.0 (H) 08/04/2022 1114   O2SAT 100 08/04/2022 1114    CBG (last 3)  Recent Labs    08/04/22 2150 08/04/22 2323 08/05/22 0315  GLUCAP 166* 167* 208*    Critical care time: 33 minutes  Chesley Mires, MD [place] Pager - [phone] or [phone] 08/05/2022, 7:35 AM

## 2022-08-05 NOTE — Progress Notes (Addendum)
STROKE TEAM PROGRESS NOTE   INTERVAL HISTORY Repeat head CT stable. Heparin infusing. Vascular surgery planning tentative amputation of left leg this week. Daughter is at the bedside today. Cleviprex had previously been maxed, however she had a drop in blood pressure and it is now off.   Vitals:   08/05/22 1100 08/05/22 1114 08/05/22 1115 08/05/22 1200  BP: (!) 84/50   (!) 76/54  Pulse: 90 89  87  Resp: '14 18  16  '$ Temp:      TempSrc:      SpO2: 96% 98% 98% 100%  Weight:      Height:       CBC:  Recent Labs  Lab 08/03/22 2156 08/04/22 0648 08/04/22 1114 08/05/22 0400  WBC 13.6*  --   --  18.6*  HGB 14.1   < > 9.5* 7.8*  HCT 43.1   < > 28.0* 24.1*  MCV 89.6  --   --  95.6  PLT 375  --   --  253   < > = values in this interval not displayed.   Basic Metabolic Panel:  Recent Labs  Lab 08/04/22 0322 08/04/22 0648 08/04/22 1114 08/05/22 0400  NA 136 142 143 140  K 4.3 3.2* 3.5 3.5  CL 101 109  --  107  CO2 17*  --   --  20*  GLUCOSE 589* 520*  --  218*  BUN 30* 25*  --  38*  CREATININE 1.13* 0.60  --  1.53*  CALCIUM 9.1  --   --  8.1*  MG  --   --   --  2.3   Lipid Panel:  Recent Labs  Lab 08/05/22 0400  CHOL 96  TRIG 531*  535*  HDL 25*  CHOLHDL 3.8  VLDL UNABLE TO CALCULATE IF TRIGLYCERIDE OVER 400 mg/dL  LDLCALC UNABLE TO CALCULATE IF TRIGLYCERIDE OVER 400 mg/dL   HgbA1c: No results for input(s): "HGBA1C" in the last 168 hours. Urine Drug Screen:  Recent Labs  Lab 08/04/22 1730  LABOPIA POSITIVE*  COCAINSCRNUR NONE DETECTED  LABBENZ POSITIVE*  AMPHETMU NONE DETECTED  THCU NONE DETECTED  LABBARB NONE DETECTED    Alcohol Level  Recent Labs  Lab 08/04/22 0322  ETH <10    IMAGING past 24 hours CT HEAD WO CONTRAST (5MM)  Result Date: 08/05/2022 CLINICAL DATA:  Hemorrhagic stroke EXAM: CT HEAD WITHOUT CONTRAST TECHNIQUE: Contiguous axial images were obtained from the base of the skull through the vertex without intravenous contrast. RADIATION  DOSE REDUCTION: This exam was performed according to the departmental dose-optimization program which includes automated exposure control, adjustment of the mA and/or kV according to patient size and/or use of iterative reconstruction technique. COMPARISON:  Brain MRI from yesterday FINDINGS: Brain: Acute hemorrhage in the deep cerebellum with previously calculated volume, up to 2.8 cm in oblique craniocaudal span. Trace intraventricular blood clot layering in the occipital horns of the lateral ventricles by MRI. No hydrocephalus when accounting for ex vacuo dilatation of the lateral ventricles. Small chronic infarct in the inferior right cerebellum. Chronic small vessel ischemia in the cerebral white matter. Remote perforator infarct in the left corona radiata. No evidence of acute infarct. Vascular: No hyperdense vessel or unexpected calcification. Skull: Normal. Negative for fracture or focal lesion. Sinuses/Orbits: No acute finding. IMPRESSION: No progression of the cerebellar hematoma.  No hydrocephalus. Electronically Signed   By: Jorje Guild M.D.   On: 08/05/2022 06:10   MR BRAIN W WO CONTRAST  Result Date:  08/04/2022 CLINICAL DATA:  Intracranial hemorrhage EXAM: MRI HEAD WITHOUT AND WITH CONTRAST TECHNIQUE: Multiplanar, multiecho pulse sequences of the brain and surrounding structures were obtained without and with intravenous contrast. CONTRAST:  71m GADAVIST GADOBUTROL 1 MMOL/ML IV SOLN COMPARISON:  CT 08/04/2022 FINDINGS: Brain: No acute infarct, mass effect or extra-axial collection. Intraparenchymal hematoma centered at the dentate nucleus of the left cerebellum is unchanged in size. Mild surrounding edema. There is extension into the fourth ventricle. No hydrocephalus There is multifocal hyperintense T2-weighted signal within the white matter. Generalized volume loss. 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 There is no abnormal contrast enhancement. Vascular: Major flow voids are preserved.  Skull and upper cervical spine: Normal calvarium and skull base. Visualized upper cervical spine and soft tissues are normal. Sinuses/Orbits:No paranasal sinus fluid levels or advanced mucosal thickening. No mastoid or middle ear effusion. Normal orbits. IMPRESSION: 1. Unchanged size of intraparenchymal hematoma centered at the dentate nucleus of the left cerebellum with extension into the fourth ventricle. No hydrocephalus. 2. No abnormal contrast enhancement. Electronically Signed   By: KUlyses JarredM.D.   On: 08/04/2022 20:52   ECHOCARDIOGRAM COMPLETE  Result Date: 08/04/2022    ECHOCARDIOGRAM REPORT   Patient Name:   RNoor WitteDate of Exam: 08/04/2022 Medical Rec #:  [id number]  Height:       60.0 in Accession #:    [id number] Weight:       129.4 lb Date of Birth:  [birth year]  BSA:          1.551 m Patient Age:    66years    BP:           112/69 mmHg Patient Gender: F           HR:           100 bpm. Exam Location:  Inpatient Procedure: 2D Echo, Cardiac Doppler and Color Doppler Indications:    Stroke  History:        Patient has no prior history of Echocardiogram examinations. CAD                 and Previous Myocardial Infarction, Stroke and COPD, Aortic                 Valve Disease, Signs/Symptoms:Dizziness/Lightheadedness,                 Shortness of Breath and Dyspnea; Risk Factors:Diabetes, Current                 Smoker and Dyslipidemia. TAVR procedure 09/2016. Rheumatic fever.                 ICH. Aortic stenosis.                 Aortic Valve: 23 mm Sapien prosthetic, stented (TAVR) valve is                 present in the aortic position. Procedure Date: 09/2016.  Sonographer:    TRoseanna RainbowRDCS Referring Phys: [facility] Sonographer Comments: Technically difficult study due to poor echo windows and echo performed with patient supine and on artificial respirator. IMPRESSIONS  1. Left ventricular ejection fraction, by estimation, is 60 to 65%. The left ventricle has normal function. The  left ventricle has no regional wall motion abnormalities. Left ventricular diastolic parameters are indeterminate.  2. Right ventricular systolic function is normal. The right ventricular size is normal.  3. The mitral valve is  abnormal. Trivial mitral valve regurgitation. Mild mitral stenosis. The mean mitral valve gradient is 6.5 mmHg with average heart rate of 95 bpm. Moderate mitral annular calcification.  4. The aortic valve has been repaired/replaced. Aortic valve regurgitation is trivial. There is a 23 mm Sapien prosthetic (TAVR) valve present in the aortic position. Procedure Date: 09/2016. Echo findings are consistent with normal structure and function of the aortic valve prosthesis. Aortic valve mean gradient measures 9.2 mmHg.  5. The inferior vena cava is normal in size with greater than 50% respiratory variability, suggesting right atrial pressure of 3 mmHg. Comparison(s): Prior images unable to be directly viewed, comparison made by report only. Echo report from [facility] 11/18/2017 reviewed as comparison. Conclusion(s)/Recommendation(s): No intracardiac source of embolism detected on this transthoracic study. Consider a transesophageal echocardiogram to exclude cardiac source of embolism if clinically indicated. FINDINGS  Left Ventricle: Left ventricular ejection fraction, by estimation, is 60 to 65%. The left ventricle has normal function. The left ventricle has no regional wall motion abnormalities. The left ventricular internal cavity size was normal in size. Suboptimal image quality limits for assessment of left ventricular hypertrophy. Left ventricular diastolic parameters are indeterminate. Right Ventricle: The right ventricular size is normal. Right vetricular wall thickness was not well visualized. Right ventricular systolic function is normal. Left Atrium: Left atrial size was normal in size. Right Atrium: Right atrial size was normal in size. Pericardium: There is no evidence of pericardial  effusion. Mitral Valve: The mitral valve is abnormal. There is moderate thickening of the mitral valve leaflet(s). There is moderate calcification of the mitral valve leaflet(s). Moderate mitral annular calcification. Trivial mitral valve regurgitation. Mild mitral valve stenosis. MV peak gradient, 18.6 mmHg. The mean mitral valve gradient is 6.5 mmHg with average heart rate of 95 bpm. Tricuspid Valve: The tricuspid valve is grossly normal. Tricuspid valve regurgitation is trivial. No evidence of tricuspid stenosis. Aortic Valve: The aortic valve has been repaired/replaced. Aortic valve regurgitation is trivial. Aortic valve mean gradient measures 9.2 mmHg. Aortic valve peak gradient measures 17.8 mmHg. Aortic valve area, by VTI measures 2.35 cm. There is a 23 mm Sapien prosthetic, stented (TAVR) valve present in the aortic position. Procedure Date: 09/2016. Echo findings are consistent with normal structure and function of the aortic valve prosthesis. Pulmonic Valve: The pulmonic valve was not well visualized. Pulmonic valve regurgitation is not visualized. Aorta: The aortic root and ascending aorta are structurally normal, with no evidence of dilitation. Venous: The inferior vena cava is normal in size with greater than 50% respiratory variability, suggesting right atrial pressure of 3 mmHg. IAS/Shunts: The interatrial septum was not well visualized.  LEFT VENTRICLE PLAX 2D LVIDd:         3.30 cm     Diastology LVIDs:         2.30 cm     LV e' medial:    3.92 cm/s LV PW:         0.80 cm     LV E/e' medial:  27.3 LV IVS:        1.60 cm     LV e' lateral:   5.33 cm/s LVOT diam:     2.20 cm     LV E/e' lateral: 20.1 LV SV:         78 LV SV Index:   50 LVOT Area:     3.80 cm  LV Volumes (MOD) LV vol d, MOD A2C: 43.0 ml LV vol d, MOD A4C: 42.9 ml LV vol  s, MOD A2C: 15.8 ml LV vol s, MOD A4C: 13.8 ml LV SV MOD A2C:     27.2 ml LV SV MOD A4C:     42.9 ml LV SV MOD BP:      29.1 ml RIGHT VENTRICLE             IVC RV S  prime:     14.70 cm/s  IVC diam: 1.20 cm TAPSE (M-mode): 1.7 cm LEFT ATRIUM             Index        RIGHT ATRIUM          Index LA diam:        2.60 cm 1.68 cm/m   RA Area:     6.76 cm LA Vol (A2C):   28.8 ml 18.56 ml/m  RA Volume:   10.90 ml 7.03 ml/m LA Vol (A4C):   12.5 ml 8.06 ml/m LA Biplane Vol: 18.9 ml 12.18 ml/m  AORTIC VALVE AV Area (Vmax):    2.02 cm AV Area (Vmean):   2.10 cm AV Area (VTI):     2.35 cm AV Vmax:           211.20 cm/s AV Vmean:          139.400 cm/s AV VTI:            0.331 m AV Peak Grad:      17.8 mmHg AV Mean Grad:      9.2 mmHg LVOT Vmax:         112.00 cm/s LVOT Vmean:        77.100 cm/s LVOT VTI:          0.205 m LVOT/AV VTI ratio: 0.62  AORTA Ao Root diam: 2.70 cm Ao Asc diam:  2.75 cm MITRAL VALVE MV Area (PHT): 4.02 cm     SHUNTS MV Area VTI:   1.90 cm     Systemic VTI:  0.20 m MV Peak grad:  18.6 mmHg    Systemic Diam: 2.20 cm MV Mean grad:  6.5 mmHg MV Vmax:       2.16 m/s MV Vmean:      113.3 cm/s MV Decel Time: 189 msec MV E velocity: 107.00 cm/s MV A velocity: 184.00 cm/s MV E/A ratio:  0.58 Buford Dresser MD Electronically signed by Buford Dresser MD Signature Date/Time: 08/04/2022/3:42:28 PM    Final    DG Abd Portable 1V  Result Date: 08/04/2022 CLINICAL DATA:  Encounter for feeding tube placement EXAM: PORTABLE ABDOMEN - 1 VIEW COMPARISON:  None Available. FINDINGS: Feeding tube courses below the diaphragm with the tip either in distal stomach or proximal duodenum. Small amount of contrast within proximal duodenum. Foley catheter in place. Bilateral iliac stents. Nonspecific bowel gas pattern. Visualized lung bases appear clear. Multilevel degenerative change of the spine. IMPRESSION: Feeding tube courses below the diaphragm with the tip either in distal stomach or proximal duodenum. Small amount of contrast within proximal duodenum. Electronically Signed   By: Margaretha Sheffield M.D.   On: 08/04/2022 12:37    PHYSICAL EXAM  Physical Exam   Constitutional: Elderly, ill appearing female Cardiovascular: Normal rate and regular rhythm.  Respiratory: Mechanically ventilated   Neuro: Mental Status: Intubated, opens eyes to voice, but does not track or follow commands Cranial Nerves: Opens eyes to voice, pupils are 84m and reactive. Does not track examiner. Incomplete gaze to the left, occulocephalic reflex intact. Corneals intact. Head is midline Cough and gag intact.  Motor: Extremities are stiff. Upper extremities move antigravity, right more than left.  No movement in lower extremities to painful stimuli.     ASSESSMENT/PLAN [name] is a 71 y.o. female with history of  smoker(2ppd), severe AI/AS s/p TAVR, CAD s/p PCI and on DAPT, COPD, PAD, HTN who presented to [facility] ED with sudden onset headache, dizziness while sitting on the toilet having a bowel movement and pushing down. She fell back and hit her back but not her head. She reported significant severe headache in the back of her head and was brought in to the ED where [facility] demonstrated acute IPH in the cerebellar vermis with about 11cc of IPH with mild mass effect on the 4th ventricle with no herniation. She was given DDAVP. She was hypertensive to 832P systolic and was started on Nicardipine gtt. She endorses pain from her thighs down her legs. Her lower extremities are cold to touch and appear a little mottled. On arrival to [facility] ED, vascular surgery consulted and she was emergently taken to the OR. Returned to 4N intubated.   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 70% stenosis of innominate artery origin, bilateral VA origin  stenosis Follow up Head CT 12/12 - Stable intraparenchymal hematoma within the cerebellar vermis. Stable moderate mass effect upon the fourth ventricle. MRI brain w wo stable left cerebellar ICH with extension to 4th ventricle. No hydrocephalus 2D Echo EF 60 to 65% LDL pending TG 531 HgbA1c 12.0 in 06/2022 VTE prophylaxis - heparin IV aspirin 81 mg daily and clopidogrel 75 mg daily prior to admission, now on heparin IV per stroke protocol Therapy recommendations:  pending Disposition:  pending   PAD status post stenting Bilateral lower extremity [place] 2B acute limb ischemia vascular surgery on board S/p b/l common femoral endarterectomy, b/l iliofemoral embolectomy and stent of b/l common iliac arteries, right external iliac artery and left external iliac artery, b/l 4 compartment fasciotomies Left leg still risk of amputation Now on heparin IV at stroke goal 0.3-0.5  Acute respiratory failure On vent CCM on board for vent management wean per CCM On propofol, fentanyl and precedex  Hypertension -> hypotension  Home meds:  metoprolol succinate, losartan Stable BP goal < 160 Was on IV Cleviprex - now off Avoid low BP Developed low BP 12/12, s/p 1L bolus NS  Strict I&O  Hyperlipidemia Home meds:  atorvastatin '80mg'$ , resumed in hospital LDL pending, goal < 70 TG 531, likely due to cleviprex Continue statin at discharge  Diabetes type II Uncontrolled Home meds:  metformin, insulin, levemir  HgbA1c 12.0, goal < 7.0 Hyperglycemia  CBGs q4 SSI Insulin gtt off   Dysphagia  Cortrak placed  TF on hold given potential surgical intervention  Tobacco abuse Current smoker Smoking cessation counseling will be provided Nicotine patch provided  Other Stroke Risk Factors Advanced Age >/= 28  Coronary artery disease status post PCI  Other Active Problems COPD Status post TAVR   Hospital day # 1  Patient seen and examined by NP/APP with MD. MD to update note as  needed.   Janine Ores, DNP, FNP-BC [facility] Pager: [phone]  ATTENDING NOTE: I reviewed above  note and agree with the assessment and plan. Pt was seen and examined.   Daughter at bedside.  Patient still intubated, however open eyes on voice, able to follow midline simple commands in delayed fashion but not peripheral commands.  Bilateral upper extremity against gravity symmetrical 3/5.  Bilateral lower extremity no spontaneous movement.  However, left foot sign of circulation instead of pale yesterday.  Patient A line BP 140ish, but cuff pressure at RUE only 90s.  On high-dose Cleviprex.  Discussed with RN, would like A-line BP around 160ish and taper down Cleviprex.  Avoid low BP.  Vascular surgery on board, may still consider left lower extremity AKA if needed.  Continue heparin IV per stroke protocol, on statin.  Appreciate CCM assistance.  For detailed assessment and plan, please refer to above/below as I have made changes wherever appropriate.   Rosalin Hawking, MD PhD Stroke Neurology 08/05/2022 2:04 PM  This patient is critically ill due to cerebellar ICH, bilateral lower extremity ischemic limbs, hypertension/hypotension, on heparin IV and at significant risk of neurological worsening, death form hematoma expansion, brain herniation, ischemic limbs, septic shock. This patient's care requires constant monitoring of vital signs, hemodynamics, respiratory and cardiac monitoring, review of multiple databases, neurological assessment, discussion with family, other specialists and medical decision making of high complexity. I spent 40 minutes of neurocritical care time in the care of this patient. I had long discussion with daughter at bedside, updated pt current condition, treatment plan and potential prognosis, and answered all the questions.  She expressed understanding and appreciation.      To contact Stroke Continuity provider, please refer to [email or web address] After hours,  contact General Neurology

## 2022-08-05 NOTE — Progress Notes (Signed)
Dr Halford Chessman and [place] Crane PA-C notified that patient MAP 56-60 and only 25 cc urine since adding maintenance fluids at 75/hr. Dr Halford Chessman ordered another 1L NS bolus. Will start ASAP.  Montez Hageman RN

## 2022-08-05 NOTE — Progress Notes (Signed)
Dr. Halford Chessman and [place] Crane, PA, to the pt's bedside to evaluate. Pt had 90 mL UO for the entire shift, after receiving 2L of fluids boluses + additional maintenance fluids. Pt's bp and MAP improved at this time. See new orders.   Justice Rocher, RN

## 2022-08-05 NOTE — Progress Notes (Signed)
RT transported pt to and from CT without event. 

## 2022-08-05 NOTE — Progress Notes (Signed)
ANTICOAGULATION CONSULT NOTE - Follow Up Consult  Pharmacy Consult for heparin Indication:  critical limb ischemia in setting of ICH  Labs: Recent Labs    08/03/22 2156 08/04/22 0322 08/04/22 0648 08/04/22 1114 08/04/22 1905 08/05/22 0400  HGB 14.1  --  11.6* 9.5*  --  7.8*  HCT 43.1  --  34.0* 28.0*  --  24.1*  PLT 375  --   --   --   --  253  LABPROT 12.6  --   --   --   --   --   INR 1.0  --   --   --   --   --   HEPARINUNFRC  --   --   --   --  0.55 0.47  CREATININE 1.26* 1.13* 0.60  --   --  1.53*  TROPONINIHS 15  --   --   --   --   --     Assessment/Plan:  71yo female therapeutic on heparin after rate change. Will continue infusion at current rate of 650 units/hr and confirm stable with additional level.   Heather [name], PharmD, BCPS  08/05/2022,5:24 AM

## 2022-08-05 NOTE — Progress Notes (Addendum)
  Progress Note    08/05/2022 7:58 AM 1 Day Post-Op  Subjective:  intubated and sedated.  Daughter at the bedside.   Vitals:   08/05/22 0736 08/05/22 0739  BP:    Pulse:    Resp:    Temp:    SpO2: 100% 99%   Physical Exam: Lungs:  ventilated Incisions:  groin incisions c/d/I without hematoma Extremities:  R popliteal signal by doppler, brisk cap refill R GT; L foot cool to touch with temperature change around proximal shin Neurologic: sedated  CBC    Component Value Date/Time   WBC 18.6 (H) 08/05/2022 0400   RBC 2.52 (L) 08/05/2022 0400   HGB 7.8 (L) 08/05/2022 0400   HGB 14.3 06/20/2012 2127   HCT 24.1 (L) 08/05/2022 0400   HCT 42.9 06/20/2012 2127   PLT 253 08/05/2022 0400   PLT 357 06/20/2012 2127   MCV 95.6 08/05/2022 0400   MCV 91 06/20/2012 2127   MCH 31.0 08/05/2022 0400   MCHC 32.4 08/05/2022 0400   RDW 13.9 08/05/2022 0400   RDW 13.6 06/20/2012 2127   LYMPHSABS 1.8 11/15/2020 0539   MONOABS 0.8 11/15/2020 0539   EOSABS 0.4 11/15/2020 0539   BASOSABS 0.0 11/15/2020 0539    BMET    Component Value Date/Time   NA 140 08/05/2022 0400   NA 135 (L) 06/20/2012 2127   K 3.5 08/05/2022 0400   K 4.0 06/20/2012 2127   CL 107 08/05/2022 0400   CL 100 06/20/2012 2127   CO2 20 (L) 08/05/2022 0400   CO2 27 06/20/2012 2127   GLUCOSE 218 (H) 08/05/2022 0400   GLUCOSE 250 (H) 06/20/2012 2127   BUN 38 (H) 08/05/2022 0400   BUN 20 (H) 06/20/2012 2127   CREATININE 1.53 (H) 08/05/2022 0400   CREATININE 0.60 06/20/2012 2127   CALCIUM 8.1 (L) 08/05/2022 0400   CALCIUM 8.8 06/20/2012 2127   GFRNONAA 37 (L) 08/05/2022 0400   GFRNONAA >60 06/20/2012 2127   GFRAA >60 06/20/2012 2127    INR    Component Value Date/Time   INR 1.0 08/03/2022 2156     Intake/Output Summary (Last 24 hours) at 08/05/2022 0758 Last data filed at 08/05/2022 0700 Gross per 24 hour  Intake 2629.79 ml  Output 1005 ml  Net 1624.79 ml     Assessment/Plan:  71 y.o. female is s/p  B CFA endarterectomy with bilateral iliac stenting and 4 compartment fasciotomies 1 Day Post-Op   R foot warm with good cap refill; dopplerable R popliteal; L leg is cold below the knee CT head does not show any progression of cerebellar hematoma; continue IV heparin Patient will likely need L AKA in the coming days Vascular will continue to follow   Dagoberto Ligas, PA-C [facility] [phone] 08/05/2022 7:58 AM  I have independently interviewed and examined patient and agree with PA assessment and plan above.  Right foot is warm and pink left foot is warm to the level of the knee.  On IV heparin scans are stable.  Will allow patient to wake up and determine symptoms we will certainly need left above-knee amputation in the coming days.  Couper Juncaj C. Donzetta Matters, MD [facility] [place] Office: [phone] Pager: [phone]

## 2022-08-05 NOTE — Progress Notes (Signed)
Dr Halford Chessman notified that patient has had 25 cc urine output after completion of 1 L NS bolus. Patient bladder scanned just in case and it was 34m. Will follow up with further orders.  CMontez HagemanRN

## 2022-08-05 NOTE — Progress Notes (Signed)
ANTICOAGULATION CONSULT NOTE  Pharmacy Consult for Heparin Indication: Critical limb ischemia  Allergies  Allergen Reactions   Iodinated Contrast Media Hives   Sulfa Antibiotics Hives   Shellfish Allergy Rash and Other (See Comments)    Scallops specifically cause VOMITING      Patient Measurements: Height: 5' (152.4 cm) Weight: 58.7 kg (129 lb 6.6 oz) IBW/kg (Calculated) : 45.5 Heparin Dosing Weight: 57 kg  Vital Signs: Temp: 97.8 F (36.6 C) (12/12 1200) Temp Source: Axillary (12/12 1200) BP: 92/49 (12/12 1300) Pulse Rate: 87 (12/12 1300)  Labs: Recent Labs    08/03/22 2156 08/04/22 0322 08/04/22 0648 08/04/22 1114 08/04/22 1905 08/05/22 0400 08/05/22 1020  HGB 14.1  --  11.6* 9.5*  --  7.8*  --   HCT 43.1  --  34.0* 28.0*  --  24.1*  --   PLT 375  --   --   --   --  253  --   LABPROT 12.6  --   --   --   --   --   --   INR 1.0  --   --   --   --   --   --   HEPARINUNFRC  --   --   --   --  0.55 0.47 0.36  CREATININE 1.26* 1.13* 0.60  --   --  1.53*  --   TROPONINIHS 15  --   --   --   --   --   --      Estimated Creatinine Clearance: 27.8 mL/min (A) (by C-G formula based on SCr of 1.53 mg/dL (H)).   Assessment: 60 YOF presenting with intracranial hemorrhage and critical limb ischemia requiring initiation of heparin for emergent thrombectomy/embolectomy. Past medical history: HFpEF, CAD s/p PCI on DAPT, severe AI/AS s/p TAVR, COPD, PAD, HTN. No anticoagulation PTA.   Despite high risk of worsening hemorrhage, CT showed stable ICH; thus, using neuro scale heparin with goal 0.3-0.5 with no bolusing per neurology.   Scr jumped from 0.60 to 1.53 on 12/12 and more stabilized at 1.89 on 12/13. Heparin level found to be supratherapeutic at 0.71 today on 12/13 likely d/t AKI. No s/sx of bleeding.     Goal of Therapy:  Heparin level 0.3-0.5 units/mL Monitor platelets by anticoagulation protocol: Yes   Plan:  Lower heparin infusion from 650 units/hr to 500  units/hr.  Check 8hr heparin level (1630).  Monitor daily CBC, heparin level, s/sx of bleeding.   Angus Seller, PharmD Candidate 08/06/2022, 8:42 PM

## 2022-08-05 NOTE — Progress Notes (Signed)
[facility] Progress Note Patient Name: Heather [name] DOB: [birth year] MRN: [id number]   Date of Service  08/05/2022  HPI/Events of Note  Elevated HR - Appears to be sinus tachycardia with HR = 119 on bedside monitor. DDx: Volume depletion vs pain vs anemia vs anemia. Hgb = 14.3. LVEF = 60-65% with no WMA.  eICU Interventions  Plan: Bolus with 0.9 NaCl 500 mL IV over 30 minutes now. Treat pain as ordered. .     Intervention Category Major Interventions: Arrhythmia - evaluation and management  Atul Delucia Eugene 08/05/2022, 3:39 AM

## 2022-08-05 NOTE — Progress Notes (Signed)
Right posterior tib and popliteal pulses very faint on the doppler at this time. The foot is cooler and the toes appear to be more pale. Dr. Trula Slade aware. Will notify him if the right foot becomes mottled or pulses are no longer able to be heard on the doppler.

## 2022-08-05 NOTE — Progress Notes (Signed)
SLP Cancellation Note  Patient Details Name: Azhia Siefken MRN: [id number] DOB: 1951/08/14   Cancelled treatment:       Reason Eval/Treat Not Completed: Patient not medically ready   Julian Askin, [name] 08/05/2022, 9:23 AM

## 2022-08-05 NOTE — Progress Notes (Addendum)
[facility] Progress Note Patient Name: Heather [name] DOB: [birth year] MRN: [id number]   Date of Service  08/05/2022  HPI/Events of Note  CVP = 16 and CK = 11322. Creatinine = 1.89. Labs c/w rhabdomyalysis related renal dysfunction.   eICU Interventions  Plan: Stop LR bolus. Lasix 20 mg IV X 1.  Urine myoglybin now.  Continue hydration with 0.9 NaCl IV infusion at 75 mL/hour.     Intervention Category Major Interventions: Other:  Lysle Dingwall 08/05/2022, 11:01 PM

## 2022-08-05 NOTE — Progress Notes (Signed)
eLink Physician-Brief Progress Note Patient Name: Heather [name] DOB: Feb 19, 1951 MRN: [id number]   Date of Service  08/05/2022  HPI/Events of Note  Review of CXR post withdrawal of ETT.  ETT tip seen in appropriate position 18 mm above the carina.   eICU Interventions  Continue present management.     Intervention Category Major Interventions: Other:  Lysle Dingwall 08/05/2022, 9:42 PM

## 2022-08-05 NOTE — Progress Notes (Signed)
Dr Halford Chessman notified that patient has only had 40 cc of urine since 0700. Patient BP is also dropping and cleviprex has been turned off. Cleviprex was max at 32/hr at start of shift. Will follow up with further orders.  Montez Hageman RN

## 2022-08-06 DIAGNOSIS — I7025 Atherosclerosis of native arteries of other extremities with ulceration: Secondary | ICD-10-CM

## 2022-08-06 DIAGNOSIS — D649 Anemia, unspecified: Secondary | ICD-10-CM

## 2022-08-06 DIAGNOSIS — I614 Nontraumatic intracerebral hemorrhage in cerebellum: Secondary | ICD-10-CM | POA: Diagnosis not present

## 2022-08-06 DIAGNOSIS — N179 Acute kidney failure, unspecified: Secondary | ICD-10-CM

## 2022-08-06 DIAGNOSIS — M6282 Rhabdomyolysis: Secondary | ICD-10-CM

## 2022-08-06 DIAGNOSIS — J9601 Acute respiratory failure with hypoxia: Secondary | ICD-10-CM | POA: Diagnosis not present

## 2022-08-06 DIAGNOSIS — L97509 Non-pressure chronic ulcer of other part of unspecified foot with unspecified severity: Secondary | ICD-10-CM

## 2022-08-06 DIAGNOSIS — J9602 Acute respiratory failure with hypercapnia: Secondary | ICD-10-CM

## 2022-08-06 LAB — HEMOGLOBIN AND HEMATOCRIT, BLOOD
HCT: 24.8 % — ABNORMAL LOW (ref 36.0–46.0)
Hemoglobin: 8.4 g/dL — ABNORMAL LOW (ref 12.0–15.0)

## 2022-08-06 LAB — CBC
HCT: 17.2 % — ABNORMAL LOW (ref 36.0–46.0)
Hemoglobin: 5.6 g/dL — CL (ref 12.0–15.0)
MCH: 30.3 pg (ref 26.0–34.0)
MCHC: 32.6 g/dL (ref 30.0–36.0)
MCV: 93 fL (ref 80.0–100.0)
Platelets: 159 10*3/uL (ref 150–400)
RBC: 1.85 MIL/uL — ABNORMAL LOW (ref 3.87–5.11)
RDW: 15.3 % (ref 11.5–15.5)
WBC: 13.8 10*3/uL — ABNORMAL HIGH (ref 4.0–10.5)
nRBC: 0 % (ref 0.0–0.2)

## 2022-08-06 LAB — BASIC METABOLIC PANEL
Anion gap: 8 (ref 5–15)
BUN: 46 mg/dL — ABNORMAL HIGH (ref 8–23)
CO2: 18 mmol/L — ABNORMAL LOW (ref 22–32)
Calcium: 6.9 mg/dL — ABNORMAL LOW (ref 8.9–10.3)
Chloride: 111 mmol/L (ref 98–111)
Creatinine, Ser: 1.89 mg/dL — ABNORMAL HIGH (ref 0.44–1.00)
GFR, Estimated: 28 mL/min — ABNORMAL LOW (ref >60)
Glucose, Bld: 235 mg/dL — ABNORMAL HIGH (ref 70–99)
Potassium: 4.2 mmol/L (ref 3.5–5.1)
Sodium: 137 mmol/L (ref 135–145)

## 2022-08-06 LAB — GLUCOSE, CAPILLARY
Glucose-Capillary: 166 mg/dL — ABNORMAL HIGH (ref 70–99)
Glucose-Capillary: 298 mg/dL — ABNORMAL HIGH (ref 70–99)
Glucose-Capillary: 363 mg/dL — ABNORMAL HIGH (ref 70–99)
Glucose-Capillary: 318 mg/dL — ABNORMAL HIGH (ref 70–99)
Glucose-Capillary: 225 mg/dL — ABNORMAL HIGH (ref 70–99)
Glucose-Capillary: 224 mg/dL — ABNORMAL HIGH (ref 70–99)

## 2022-08-06 LAB — HEPARIN LEVEL (UNFRACTIONATED)
Heparin Unfractionated: 0.71 IU/mL — ABNORMAL HIGH (ref 0.30–0.70)
Heparin Unfractionated: 0.15 IU/mL — ABNORMAL LOW (ref 0.30–0.70)

## 2022-08-06 LAB — LACTIC ACID, PLASMA: Lactic Acid, Venous: 0.9 mmol/L (ref 0.5–1.9)

## 2022-08-06 LAB — PREPARE RBC (CROSSMATCH)

## 2022-08-06 MED ORDER — INSULIN ASPART 100 UNIT/ML IJ SOLN
2.0000 [IU] | INTRAMUSCULAR | Status: DC
  Administered 2022-08-06 – 2022-08-08 (×11): 2 [IU] via SUBCUTANEOUS

## 2022-08-06 MED ORDER — HEPARIN (PORCINE) 25000 UT/250ML-% IV SOLN
1000.0000 [IU]/h | INTRAVENOUS | Status: DC
  Administered 2022-08-07: 750 [IU]/h via INTRAVENOUS
  Filled 2022-08-06 (×2): qty 250

## 2022-08-06 MED ORDER — ATORVASTATIN CALCIUM 40 MG PO TABS
40.0000 mg | ORAL_TABLET | Freq: Every day | ORAL | Status: DC
  Administered 2022-08-06 – 2022-08-07 (×2): 40 mg
  Filled 2022-08-06 (×2): qty 1

## 2022-08-06 MED ORDER — SODIUM CHLORIDE 0.9% IV SOLUTION: Freq: Once | INTRAVENOUS | Status: DC

## 2022-08-06 MED ORDER — INSULIN ASPART 100 UNIT/ML IJ SOLN
0.0000 [IU] | INTRAMUSCULAR | Status: DC
  Administered 2022-08-06: 7 [IU] via SUBCUTANEOUS
  Administered 2022-08-06: 15 [IU] via SUBCUTANEOUS
  Administered 2022-08-07: 4 [IU] via SUBCUTANEOUS
  Administered 2022-08-07: 11 [IU] via SUBCUTANEOUS
  Administered 2022-08-07: 7 [IU] via SUBCUTANEOUS
  Administered 2022-08-07: 3 [IU] via SUBCUTANEOUS
  Administered 2022-08-07: 7 [IU] via SUBCUTANEOUS
  Administered 2022-08-07: 11 [IU] via SUBCUTANEOUS
  Administered 2022-08-08: 20 [IU] via SUBCUTANEOUS
  Administered 2022-08-08: 11 [IU] via SUBCUTANEOUS
  Administered 2022-08-08: 7 [IU] via SUBCUTANEOUS
  Administered 2022-08-08: 11 [IU] via SUBCUTANEOUS
  Administered 2022-08-08: 20 [IU] via SUBCUTANEOUS
  Administered 2022-08-08: 7 [IU] via SUBCUTANEOUS
  Administered 2022-08-09: 11 [IU] via SUBCUTANEOUS
  Administered 2022-08-09: 7 [IU] via SUBCUTANEOUS
  Administered 2022-08-09: 4 [IU] via SUBCUTANEOUS
  Administered 2022-08-09: 11 [IU] via SUBCUTANEOUS
  Administered 2022-08-09: 7 [IU] via SUBCUTANEOUS
  Administered 2022-08-10 (×2): 15 [IU] via SUBCUTANEOUS
  Administered 2022-08-10: 4 [IU] via SUBCUTANEOUS
  Administered 2022-08-10: 11 [IU] via SUBCUTANEOUS
  Administered 2022-08-10: 15 [IU] via SUBCUTANEOUS
  Administered 2022-08-10: 7 [IU] via SUBCUTANEOUS
  Administered 2022-08-11: 11 [IU] via SUBCUTANEOUS
  Administered 2022-08-11 (×2): 7 [IU] via SUBCUTANEOUS
  Administered 2022-08-11: 11 [IU] via SUBCUTANEOUS
  Administered 2022-08-11 – 2022-08-12 (×4): 7 [IU] via SUBCUTANEOUS
  Administered 2022-08-12: 11 [IU] via SUBCUTANEOUS
  Administered 2022-08-12: 4 [IU] via SUBCUTANEOUS
  Administered 2022-08-12: 7 [IU] via SUBCUTANEOUS
  Administered 2022-08-12 – 2022-08-13 (×2): 11 [IU] via SUBCUTANEOUS
  Administered 2022-08-13 (×2): 7 [IU] via SUBCUTANEOUS
  Administered 2022-08-14: 4 [IU] via SUBCUTANEOUS
  Administered 2022-08-14 (×2): 7 [IU] via SUBCUTANEOUS
  Administered 2022-08-14: 3 [IU] via SUBCUTANEOUS
  Administered 2022-08-15: 4 [IU] via SUBCUTANEOUS
  Administered 2022-08-15: 7 [IU] via SUBCUTANEOUS
  Administered 2022-08-15: 15 [IU] via SUBCUTANEOUS
  Administered 2022-08-15 (×2): 7 [IU] via SUBCUTANEOUS
  Administered 2022-08-16 (×3): 11 [IU] via SUBCUTANEOUS
  Administered 2022-08-16: 3 [IU] via SUBCUTANEOUS
  Administered 2022-08-17: 4 [IU] via SUBCUTANEOUS
  Administered 2022-08-17: 7 [IU] via SUBCUTANEOUS
  Administered 2022-08-17: 3 [IU] via SUBCUTANEOUS
  Administered 2022-08-17 (×2): 7 [IU] via SUBCUTANEOUS
  Administered 2022-08-18: 4 [IU] via SUBCUTANEOUS
  Administered 2022-08-18 (×2): 3 [IU] via SUBCUTANEOUS
  Administered 2022-08-18 (×2): 7 [IU] via SUBCUTANEOUS
  Administered 2022-08-18 – 2022-08-19 (×2): 4 [IU] via SUBCUTANEOUS
  Administered 2022-08-19 (×2): 3 [IU] via SUBCUTANEOUS

## 2022-08-06 MED ORDER — FUROSEMIDE 10 MG/ML IJ SOLN
40.0000 mg | Freq: Once | INTRAMUSCULAR | Status: AC
  Administered 2022-08-06: 40 mg via INTRAVENOUS
  Filled 2022-08-06: qty 4

## 2022-08-06 MED ORDER — NICOTINE 14 MG/24HR TD PT24
14.0000 mg | MEDICATED_PATCH | Freq: Every day | TRANSDERMAL | Status: DC
  Administered 2022-08-06 – 2022-09-22 (×48): 14 mg via TRANSDERMAL
  Filled 2022-08-06 (×48): qty 1

## 2022-08-06 NOTE — Progress Notes (Signed)
eLink Physician-Brief Progress Note Patient Name: Onya Eutsler DOB: March 27, 1951 MRN: [id number]   Date of Service  08/06/2022  HPI/Events of Note  Nursing reports only 51 mL of urine output since Lasix dose at 11:05 PM.  eICU Interventions  Plan: Lasix 40 mg IV X 1.     Intervention Category Major Interventions: Other:  Lysle Dingwall 08/06/2022, 12:56 AM

## 2022-08-06 NOTE — Progress Notes (Signed)
PT Cancellation Note  Patient Details Name: Heather [name] MRN: [id number] DOB: 29-Jul-1951   Cancelled Treatment:    Reason Eval/Treat Not Completed: Medical issues which prohibited therapy. Hgb of 5.6, pt remains intubated. PT will follow up when hgb is more stable and pt is better able to participate in PT eval.   Zenaida Niece 08/06/2022, 9:27 AM

## 2022-08-06 NOTE — Progress Notes (Signed)
ANTICOAGULATION CONSULT NOTE  Pharmacy Consult for Heparin Indication: Critical limb ischemia  Allergies  Allergen Reactions   Iodinated Contrast Media Hives   Sulfa Antibiotics Hives   Shellfish Allergy Nausea And Vomiting, Rash and Other (See Comments)    Scallops specifically cause VOMITING      Patient Measurements: Height: 5' (152.4 cm) Weight: 58.7 kg (129 lb 6.6 oz) IBW/kg (Calculated) : 45.5 Heparin Dosing Weight: 57 kg  Vital Signs: Temp: 99.8 F (37.7 C) (12/13 1600) Temp Source: Axillary (12/13 1600) BP: 94/56 (12/13 1600) Pulse Rate: 113 (12/13 1600)  Labs: Recent Labs    08/03/22 2156 08/04/22 0322 08/05/22 0400 08/05/22 1020 08/05/22 1848 08/06/22 0550 08/06/22 1344 08/06/22 1557  HGB 14.1   < > 7.8*  --   --  5.6* 8.4*  --   HCT 43.1   < > 24.1*  --   --  17.2* 24.8*  --   PLT 375  --  253  --   --  159  --   --   LABPROT 12.6  --   --   --   --   --   --   --   INR 1.0  --   --   --   --   --   --   --   HEPARINUNFRC  --    < > 0.47 0.36  --  0.71*  --  0.15*  CREATININE 1.26*   < > 1.53*  --  1.89* 1.89*  --   --   CKTOTAL  --   --   --   --  11,322*  --   --   --   TROPONINIHS 15  --   --   --   --   --   --   --    < > = values in this interval not displayed.     Estimated Creatinine Clearance: 22.5 mL/min (A) (by C-G formula based on SCr of 1.89 mg/dL (H)).   Assessment: 47 YOF presenting with intracranial hemorrhage and critical limb ischemia requiring initiation of heparin for emergent thrombectomy/embolectomy. Past medical history: HFpEF, CAD s/p PCI on DAPT, severe AI/AS s/p TAVR, COPD, PAD, HTN. No anticoagulation PTA.   Despite high risk of worsening hemorrhage, CT showed stable ICH; thus, using neuro scale heparin with goal 0.3-0.5 with no bolusing per neurology.   Scr jumped from 0.60 to 1.53 on 12/12 and more stabilized at 1.89 on 12/13. Heparin level found to be supratherapeutic at 0.71 today on 12/13 likely d/t AKI. No s/sx of  bleeding.     Heparin level came back subtherapeutic this PM after the rate decrease. We will increase the rate and check 8 hr HL. Will have to watch for any bleeding issues.   Hgb 8.4 s/p PRBC.   Goal of Therapy:  Heparin level 0.3-0.5 units/mL Monitor platelets by anticoagulation protocol: Yes   Plan:  Increase heparin infusion to 600 units/hr.  Check 8hr heparin level Monitor daily CBC, heparin level, s/sx of bleeding.   Onnie Boer, PharmD, BCIDP, AAHIVP, CPP Infectious Disease Pharmacist 08/06/2022 5:00 PM

## 2022-08-06 NOTE — Consult Note (Signed)
NAME:  Heather [name], MRN:  [id number], DOB:  08/28/1950, LOS: 2 ADMISSION DATE:  08/04/2022, CONSULTATION DATE:  08/04/2022 REFERRING MD:  Stroke team, CHIEF COMPLAINT:  Headache  History of Present Illness:  71 yo female smoker presented to ER with sudden onset of dizziness, blurred vision, headache, nausea and vomiting.  BP in EMS 209/76.  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  Also found to have bilateral leg pain with numbness and coolness.  Found to have occlusion of b/l common iliac artery stents, Lt SFA and popliteal stent and vascular surgery consulted.  Taken to OR for b/l common femoral endarterectomy, iliofemoral endarterectomy, lower extremity embolectomy and stent of b/l common iliac arteries and Rt external iliac artery, and b/l 4 compartment fasciotomies.  Remained on vent and cleviprex post op.  PCCM consulted to assist with critical care management.  Pertinent  Medical History  HFpEF, Anxiety, PAD, Bipolar, COPD, CAD, Depression, DM with neuropathy, GERD, HLD. HTN, s/p TAVR, Schizophrenia, Vertigo  Significant Hospital Events: Including procedures, antibiotic start and stop dates in addition to other pertinent events   12/11 admit, vascular surgery consulted >> to OR 12/12 off insulin gtt 12/13 oliguria from rhabdo  Studies:  CT head 12/10 >> acute ICH 11 cc volume in cerebellar vermis MRI brain 12/11 >> Unchanged size of intraparenchymal hematoma centered at the dentate nucleus of the left cerebellum with extension into the fourth ventricle. No hydrocephalus. Echo 12/11 >> EF 60 to 65%, mild MS, s/p TAVR CT head 12/12 >> No progression of the cerebellar hematoma. No hydrocephalus.   Interim History / Subjective:  Decreased urine outpt.  Not much response to lasix.  CPK up.  Objective   Blood pressure 121/69, pulse (!) 108, temperature 98.8 F (37.1 C), temperature source Axillary, resp. rate 16, height 5' (1.524 m), weight 58.7 kg, SpO2  100 %. CVP:  [11 mmHg-16 mmHg] 12 mmHg  Vent Mode: PRVC FiO2 (%):  [30 %-40 %] 30 % Set Rate:  [18 bmp] 18 bmp Vt Set:  [360 mL] 360 mL PEEP:  [5 cmH20] 5 cmH20 Plateau Pressure:  [13 cmH20-18 cmH20] 18 cmH20   Intake/Output Summary (Last 24 hours) at 08/06/2022 0748 Last data filed at 08/06/2022 0740 Gross per 24 hour  Intake 4446.41 ml  Output 844 ml  Net 3602.41 ml   Filed Weights   08/04/22 1000  Weight: 58.7 kg    Examination:  General - sedated Eyes - pupils reactive ENT - ETT in place Cardiac - regular rate/rhythm, no murmur Chest - equal breath sounds b/l, no wheezing or rales Abdomen - soft, non tender, + bowel sounds Extremities - extremities cool Skin - no rashes Neuro - RASS -1  Resolved Hospital Problem list   Hypertriglyceridemia from diprivan and cleviprex  Assessment & Plan:   Critical limb ischemia s/p b/l common femoral endarterectomy, iliofemoral endarterectomy, lower extremity embolectomy and stent of b/l common iliac arteries and Rt external iliac artery, and b/l 4 compartment fasciotomies. - post op care per vascular surgery - vascular hoping to avoid need for Lt lower leg amputation, but still might be needed - continue heparin gtt  ICH of cerebellar vermis. Sedation needs while on vent. Hx of depression, bipolar, schizophrenia, insomnia. - no progression of bleed on neuro-imaging - neurology following - precedex, fentanyl with prn versed for RASS goal 0 to -1 - hold outpt lexapro, trazadone  HTN emergency. Hx of CAD, chronic HFpEF, HLD, HTN, s/p TAVR. - goal  SBP < 160 - resume outpt lopressor - hold outpt lasix, ASA, plavix, cozaar, lopressor, lipitor  Compromised airway. Hx of COPD with tobacco abuse. - pressure support wean as tolerated; not ready for extubation trial yet - goal SpO2 > 92% - f/u CXR intermittently - yupelri, pulmicort, brovana with prn albuterol - nicotine patch  AKI from rhabdomyolysis. Non-gap metabolic  acidosis. - continue IV fluid - f/u urine myoglobin, BMET, urine outpt - no indication for renal replacement at this time  DM type 2 poorly controlled with hyperglycemia. DM neuropathy. - SSI - hold outpt lyrica, metformin  Anemia of critical illness. - f/u CBC - transfuse for Hb < 7  Best Practice (right click and "Reselect all SmartList Selections" daily)   Diet/type: tubefeeds DVT prophylaxis: systemic heparin GI prophylaxis: H2B Lines: Central line Foley:  Yes, and it is still needed Code Status:  full code Last date of multidisciplinary goals of care discussion '[x]'$   Labs       Latest Ref Rng & Units 08/06/2022    5:50 AM 08/05/2022    6:48 PM 08/05/2022    4:00 AM  CMP  Glucose 70 - 99 mg/dL 235  126  218   BUN 8 - 23 mg/dL 46  41  38   Creatinine 0.44 - 1.00 mg/dL 1.89  1.89  1.53   Sodium 135 - 145 mmol/L 137  139  140   Potassium 3.5 - 5.1 mmol/L 4.2  4.1  3.5   Chloride 98 - 111 mmol/L 111  112  107   CO2 22 - 32 mmol/L '18  18  20   '$ Calcium 8.9 - 10.3 mg/dL 6.9  6.9  8.1        Latest Ref Rng & Units 08/05/2022    4:00 AM 08/04/2022   11:14 AM 08/04/2022    6:48 AM  CBC  WBC 4.0 - 10.5 K/uL 18.6     Hemoglobin 12.0 - 15.0 g/dL 7.8  9.5  11.6   Hematocrit 36.0 - 46.0 % 24.1  28.0  34.0   Platelets 150 - 400 K/uL 253       ABG    Component Value Date/Time   PHART 7.256 (L) 08/04/2022 1114   PCO2ART 44.1 08/04/2022 1114   PO2ART 227 (H) 08/04/2022 1114   HCO3 19.6 (L) 08/04/2022 1114   TCO2 21 (L) 08/04/2022 1114   ACIDBASEDEF 7.0 (H) 08/04/2022 1114   O2SAT 100 08/04/2022 1114    CBG (last 3)  Recent Labs    08/05/22 1928 08/05/22 2317 08/06/22 0307  GLUCAP 138* 158* 166*    Critical care time: 38 minutes  Chesley Mires, MD [place] Pager - [phone] or [phone] 08/06/2022, 7:48 AM

## 2022-08-06 NOTE — Progress Notes (Signed)
OT Cancellation Note  Patient Details Name: Heather [name] MRN: [id number] DOB: [birth year]   Cancelled Treatment:    Reason Eval/Treat Not Completed: Medical issues which prohibited therapy.  HGB 5.6 with orders for transfusion.  OT will continue efforts.    Seraj Dunnam D Jasmia Angst 08/06/2022, 8:19 AM 08/06/2022  RP, OTR/L  Acute Rehabilitation Services  Office:  [phone]

## 2022-08-06 NOTE — Progress Notes (Signed)
    Latest Ref Rng & Units 08/06/2022    5:50 AM 08/05/2022    4:00 AM 08/04/2022   11:14 AM  CBC  WBC 4.0 - 10.5 K/uL 13.8  18.6    Hemoglobin 12.0 - 15.0 g/dL 5.6  7.8  9.5   Hematocrit 36.0 - 46.0 % 17.2  24.1  28.0   Platelets 150 - 400 K/uL 159  253      No signs of bleeding.  Will continue heparin gtt for now.  Will transfuse 2 units PRBC.  Chesley Mires, MD [place] Pager - [phone] 08/06/2022, 8:17 AM

## 2022-08-06 NOTE — Progress Notes (Signed)
Date and time results received: 08/06/22 8:30 AM  (use smartphrase ".now" to insert current time)  Test: hemoglobin Critical Value: 5.6  Name of Provider Notified: Dr Halford Chessman, CCM  Orders Received? Or Actions Taken?: order for 2u RBC.  Montez Hageman RN

## 2022-08-06 NOTE — Progress Notes (Signed)
After completion of first unit of RBC patient's temperature went from 98.8 (preblood) axillary to 100.2 (post blood) axillary. Dr Halford Chessman notified, ok to continue with second transfusion.   Montez Hageman RN

## 2022-08-06 NOTE — Progress Notes (Addendum)
  Progress Note    08/06/2022 8:39 AM 2 Days Post-Op  Subjective:  intubated and sedated   Vitals:   08/06/22 0723 08/06/22 0800  BP: (!) 155/46 123/64  Pulse: (!) 111 (!) 119  Resp: (!) 26 13  Temp:  98.8 F (37.1 C)  SpO2: 99% 97%   Physical Exam: Lungs:  mechanical ventilation Incisions:  groin incisions c/d/i Extremities:  L foot cold; R PT soft by doppler; calves soft Neurologic: sedated  CBC    Component Value Date/Time   WBC 13.8 (H) 08/06/2022 0550   RBC 1.85 (L) 08/06/2022 0550   HGB 5.6 (LL) 08/06/2022 0550   HGB 14.3 06/20/2012 2127   HCT 17.2 (L) 08/06/2022 0550   HCT 42.9 06/20/2012 2127   PLT 159 08/06/2022 0550   PLT 357 06/20/2012 2127   MCV 93.0 08/06/2022 0550   MCV 91 06/20/2012 2127   MCH 30.3 08/06/2022 0550   MCHC 32.6 08/06/2022 0550   RDW 15.3 08/06/2022 0550   RDW 13.6 06/20/2012 2127   LYMPHSABS 1.8 11/15/2020 0539   MONOABS 0.8 11/15/2020 0539   EOSABS 0.4 11/15/2020 0539   BASOSABS 0.0 11/15/2020 0539    BMET    Component Value Date/Time   NA 137 08/06/2022 0550   NA 135 (L) 06/20/2012 2127   K 4.2 08/06/2022 0550   K 4.0 06/20/2012 2127   CL 111 08/06/2022 0550   CL 100 06/20/2012 2127   CO2 18 (L) 08/06/2022 0550   CO2 27 06/20/2012 2127   GLUCOSE 235 (H) 08/06/2022 0550   GLUCOSE 250 (H) 06/20/2012 2127   BUN 46 (H) 08/06/2022 0550   BUN 20 (H) 06/20/2012 2127   CREATININE 1.89 (H) 08/06/2022 0550   CREATININE 0.60 06/20/2012 2127   CALCIUM 6.9 (L) 08/06/2022 0550   CALCIUM 8.8 06/20/2012 2127   GFRNONAA 28 (L) 08/06/2022 0550   GFRNONAA >60 06/20/2012 2127   GFRAA >60 06/20/2012 2127    INR    Component Value Date/Time   INR 1.0 08/03/2022 2156     Intake/Output Summary (Last 24 hours) at 08/06/2022 0839 Last data filed at 08/06/2022 0835 Gross per 24 hour  Intake 4346.06 ml  Output 839 ml  Net 3507.06 ml     Assessment/Plan:  71 y.o. female is s/p B CFA endarterectomy with bilateral iliac  stenting and 4 compartment fasciotomies  2 Days Post-Op   R leg well perfused with monophasic soft PT signal by doppler; L foot is cold to touch.  Plans noted for transfusion due to Hgb of 5.6.  May need to hold heparin if H&H continues to drop.  L AKA in the coming days, no absolute indication at this time.   Dagoberto Ligas, PA-C [facility] [phone] 08/06/2022 8:39 AM  I have independently interviewed and examined patient and agree with PA assessment and plan above.  Left foot remains cold but does not appear grossly ischemic there is a venous signal at the PT.  Right foot is warm and there is a PT signal there.  Patient has responded to transfusion.  At this time does not have any emergent indication for amputation would like to see what neurologic function she has in her legs when she is more awake.  Dima Mini C. Donzetta Matters, MD [facility] [place] Office: [phone] Pager: [phone]

## 2022-08-06 NOTE — Progress Notes (Signed)
STROKE TEAM PROGRESS NOTE   INTERVAL HISTORY No family is at the bedside. Pt still intubated, neuro seems stable. However, severe anemia needing PRBC transfusion. Limited UOP overnight, received bolus and lasix, now BP improving. VVS on board, may consider amputation in coming days if needed.    Vitals:   08/06/22 1500 08/06/22 1518 08/06/22 1600 08/06/22 1700  BP: (!) 100/57 (!) 150/49 (!) 94/56 (!) 104/59  Pulse: (!) 101 (!) 103 (!) 113 (!) 104  Resp: 14 (!) '26 14 14  '$ Temp:   99.8 F (37.7 C)   TempSrc:   Axillary   SpO2: 95% 94% 93% 96%  Weight:      Height:       CBC:  Recent Labs  Lab 08/05/22 0400 08/06/22 0550 08/06/22 1344  WBC 18.6* 13.8*  --   HGB 7.8* 5.6* 8.4*  HCT 24.1* 17.2* 24.8*  MCV 95.6 93.0  --   PLT 253 159  --    Basic Metabolic Panel:  Recent Labs  Lab 08/05/22 0400 08/05/22 1848 08/06/22 0550  NA 140 139 137  K 3.5 4.1 4.2  CL 107 112* 111  CO2 20* 18* 18*  GLUCOSE 218* 126* 235*  BUN 38* 41* 46*  CREATININE 1.53* 1.89* 1.89*  CALCIUM 8.1* 6.9* 6.9*  MG 2.3  --   --    Lipid Panel:  Recent Labs  Lab 08/05/22 0400  CHOL 96  TRIG 531*  535*  HDL 25*  CHOLHDL 3.8  VLDL UNABLE TO CALCULATE IF TRIGLYCERIDE OVER 400 mg/dL  LDLCALC UNABLE TO CALCULATE IF TRIGLYCERIDE OVER 400 mg/dL   HgbA1c:  Recent Labs  Lab 08/05/22 0400  HGBA1C 12.9*   Urine Drug Screen:  Recent Labs  Lab 08/04/22 1730  LABOPIA POSITIVE*  COCAINSCRNUR NONE DETECTED  LABBENZ POSITIVE*  AMPHETMU NONE DETECTED  THCU NONE DETECTED  LABBARB NONE DETECTED    Alcohol Level  Recent Labs  Lab 08/04/22 0322  ETH <10    IMAGING past 24 hours DG CHEST PORT 1 VIEW  Result Date: 08/05/2022 CLINICAL DATA:  Endotracheal tube placement EXAM: PORTABLE CHEST 1 VIEW COMPARISON:  08/05/2022 FINDINGS: Endotracheal tube seen 18 mm above the carina. Nasoenteric feeding tube extends in the upper abdomen beyond the margin examination. Right internal jugular central venous  catheter tip noted at the superior cavoatrial junction. Minimal left basilar atelectasis. Lungs are otherwise clear. No pneumothorax or pleural effusion. Transcatheter aortic valve replacement has been performed. Cardiac size within normal limits. Pulmonary vascularity is normal. No acute bone abnormality. IMPRESSION: 1. Support lines and tubes in appropriate position. 2. Minimal left basilar atelectasis. Electronically Signed   By: Fidela Salisbury M.D.   On: 08/05/2022 21:16   DG CHEST PORT 1 VIEW  Result Date: 08/05/2022 CLINICAL DATA:  Respiratory difficulty EXAM: PORTABLE CHEST 1 VIEW COMPARISON:  08/04/2022 FINDINGS: Endotracheal tube is noted at the level of the carina. This could be withdrawn 1-2 cm. Feeding catheter extends into the stomach. Right jugular central line is seen. No pneumothorax is noted. Changes of prior TAVR are seen. Lungs are well aerated with minimal left basilar atelectasis. IMPRESSION: Endotracheal tube at the level of the carina. This should be withdrawn 1-2 cm. Mild left basilar atelectasis. Electronically Signed   By: Inez Catalina M.D.   On: 08/05/2022 19:07    PHYSICAL EXAM  Physical Exam   Temp:  [98.7 F (37.1 C)-100.2 F (37.9 C)] 99.8 F (37.7 C) (12/13 1600) Pulse Rate:  [89-120] 104 (12/13  1700) Resp:  [11-26] 14 (12/13 1700) BP: (87-155)/(46-69) 104/59 (12/13 1700) SpO2:  [93 %-100 %] 96 % (12/13 1700) Arterial Line BP: (124-164)/(40-49) 150/45 (12/13 1700) FiO2 (%):  [30 %-40 %] 30 % (12/13 1600)  General - Well nourished, well developed, intubated on sedation.  Ophthalmologic - fundi not visualized due to noncooperation.  Cardiovascular - Regular rate and rhythm.  Neuro - intubated on Precedex and fentanyl, eyes closed, only followed limited midline commands with very delayed response, not following peripheral commands. With forced eye opening, eyes in mid position, not blinking to visual threat, doll's eyes sluggish, not tracking, PERRL. Corneal  reflex weak, gag and cough weak. Breathing over the vent.  Facial symmetry not able to test due to ET tube.  Tongue protrusion not cooperative. B/l UE drift to bed within 5 sec but symmetrical. B/l no movement even with pain stimulation. DTR diminished and no babinski. Sensation, coordination and gait not tested.    ASSESSMENT/PLAN [name] is a 71 y.o. female with history of  smoker(2ppd), severe AI/AS s/p TAVR, CAD s/p PCI and on DAPT, COPD, PAD, HTN who presented to [facility] ED with sudden onset headache, dizziness while sitting on the toilet having a bowel movement and pushing down. She fell back and hit her back but not her head. She reported significant severe headache in the back of her head and was brought in to the ED where [facility] demonstrated acute IPH in the cerebellar vermis with about 11cc of IPH with mild mass effect on the 4th ventricle with no herniation. She was given DDAVP. She was hypertensive to 650P systolic and was started on Nicardipine gtt. She endorses pain from her thighs down her legs. Her lower extremities are cold to touch and appear a little mottled. On arrival to [facility] ED, vascular surgery consulted and she was emergently taken to the OR. Returned to 4N intubated.   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 70% stenosis of innominate artery origin, bilateral VA origin stenosis Follow up Head CT 12/12 - Stable intraparenchymal hematoma within the cerebellar vermis. Stable moderate mass effect upon the fourth ventricle. MRI brain w wo stable left cerebellar ICH with extension to 4th ventricle. No hydrocephalus Repeat CT in am 2D Echo  EF 60 to 65% LDL 34 TG 531 HgbA1c 12.9 VTE prophylaxis - heparin IV aspirin 81 mg daily and clopidogrel 75 mg daily prior to admission, now on heparin IV per stroke protocol Therapy recommendations:  pending Disposition:  pending   PAD status post stenting Bilateral lower extremity [place] 2B acute limb ischemia vascular surgery on board S/p b/l common femoral endarterectomy, b/l iliofemoral embolectomy and stent of b/l common iliac arteries, right external iliac artery and left external iliac artery, b/l 4 compartment fasciotomies Left leg still risk of amputation Now on heparin IV at stroke goal 0.3-0.5  Acute respiratory failure On vent CCM on board for vent management wean per CCM On fentanyl and precedex  Hypertension -> hypotension  Home meds:  metoprolol succinate, losartan Stable BP goal < 160 Was on IV Cleviprex - now off Avoid low BP Developed low BP 12/12, s/p 1L bolus NS and lasix overnight  Decreased UOP Strict I&O  Hyperlipidemia Home meds:  atorvastatin '80mg'$ , resumed in hospital LDL 34, goal < 70 TG 531 Continue lipitor 40 Continue statin at discharge  Diabetes type II Uncontrolled Home meds:  metformin, insulin, levemir  HgbA1c 12.0, goal < 7.0 Hyperglycemia  CBGs q4 SSI Insulin gtt off   Severe anemia Hb 11.6-9.5-7.8-5.6-PRBC-8.4 Send post PRBC transfusion No overt bleeding source Continue heparin IV for now May hold off heparin if further bleeding  AKI Rhabdomyolysis Decreased UOP Cre 0.6-1.53-1.89-1.89 CK 11322 Status post Lasix CCM on board  Dysphagia  Cortrak placed  TF on hold given potential surgical intervention  Tobacco abuse Current smoker Smoking cessation counseling will be provided Nicotine patch provided  Other Stroke Risk Factors Advanced Age >/= 73  Coronary artery disease status post PCI  Other Active Problems COPD Status post TAVR Leukocytosis WBC 13.6-18.6-13.8   Hospital day # 2   Rosalin Hawking, MD  PhD Stroke Neurology 08/06/2022 5:24 PM  This patient is critically ill due to cerebellar ICH, bilateral lower extremity ischemic limbs, hypertension/hypotension, on heparin IV and at significant risk of neurological worsening, death form hematoma expansion, brain herniation, ischemic limbs, septic shock. This patient's care requires constant monitoring of vital signs, hemodynamics, respiratory and cardiac monitoring, review of multiple databases, neurological assessment, discussion with family, other specialists and medical decision making of high complexity. I spent 35 minutes of neurocritical care time in the care of this patient. I had long discussion with daughter at bedside, updated pt current condition, treatment plan and potential prognosis, and answered all the questions.  She expressed understanding and appreciation.      To contact Stroke Continuity provider, please refer to [email or web address] After hours, contact General Neurology

## 2022-08-06 NOTE — Progress Notes (Signed)
Phone consent for blood transfusion given to Margart Sickles and me, Oren Beckmann, by patient's daughter Mickle Plumb at this time.   Montez Hageman RN

## 2022-08-07 ENCOUNTER — Inpatient Hospital Stay (HOSPITAL_COMMUNITY): Payer: Medicare HMO

## 2022-08-07 DIAGNOSIS — D72829 Elevated white blood cell count, unspecified: Secondary | ICD-10-CM

## 2022-08-07 DIAGNOSIS — I614 Nontraumatic intracerebral hemorrhage in cerebellum: Secondary | ICD-10-CM | POA: Diagnosis not present

## 2022-08-07 DIAGNOSIS — J988 Other specified respiratory disorders: Secondary | ICD-10-CM | POA: Diagnosis not present

## 2022-08-07 DIAGNOSIS — I7025 Atherosclerosis of native arteries of other extremities with ulceration: Secondary | ICD-10-CM | POA: Diagnosis not present

## 2022-08-07 DIAGNOSIS — N179 Acute kidney failure, unspecified: Secondary | ICD-10-CM | POA: Diagnosis not present

## 2022-08-07 DIAGNOSIS — D649 Anemia, unspecified: Secondary | ICD-10-CM | POA: Diagnosis not present

## 2022-08-07 LAB — TYPE AND SCREEN
ABO/RH(D): O POS
Antibody Screen: NEGATIVE
Unit division: 0
Unit division: 0

## 2022-08-07 LAB — CBC
HCT: 25.3 % — ABNORMAL LOW (ref 36.0–46.0)
Hemoglobin: 8.3 g/dL — ABNORMAL LOW (ref 12.0–15.0)
MCH: 29.7 pg (ref 26.0–34.0)
MCHC: 32.8 g/dL (ref 30.0–36.0)
MCV: 90.7 fL (ref 80.0–100.0)
Platelets: 134 10*3/uL — ABNORMAL LOW (ref 150–400)
RBC: 2.79 MIL/uL — ABNORMAL LOW (ref 3.87–5.11)
RDW: 16 % — ABNORMAL HIGH (ref 11.5–15.5)
WBC: 12.8 10*3/uL — ABNORMAL HIGH (ref 4.0–10.5)
nRBC: 0 % (ref 0.0–0.2)

## 2022-08-07 LAB — BASIC METABOLIC PANEL
Anion gap: 6 (ref 5–15)
BUN: 51 mg/dL — ABNORMAL HIGH (ref 8–23)
CO2: 21 mmol/L — ABNORMAL LOW (ref 22–32)
Calcium: 6.9 mg/dL — ABNORMAL LOW (ref 8.9–10.3)
Chloride: 115 mmol/L — ABNORMAL HIGH (ref 98–111)
Creatinine, Ser: 1.44 mg/dL — ABNORMAL HIGH (ref 0.44–1.00)
GFR, Estimated: 39 mL/min — ABNORMAL LOW (ref >60)
Glucose, Bld: 186 mg/dL — ABNORMAL HIGH (ref 70–99)
Potassium: 3.9 mmol/L (ref 3.5–5.1)
Sodium: 142 mmol/L (ref 135–145)

## 2022-08-07 LAB — BPAM RBC
Blood Product Expiration Date: 202401102359
Blood Product Expiration Date: 202401102359
ISSUE DATE / TIME: 202312130919
ISSUE DATE / TIME: 202312130919
Unit Type and Rh: 5100
Unit Type and Rh: 5100

## 2022-08-07 LAB — GLUCOSE, CAPILLARY
Glucose-Capillary: 211 mg/dL — ABNORMAL HIGH (ref 70–99)
Glucose-Capillary: 175 mg/dL — ABNORMAL HIGH (ref 70–99)
Glucose-Capillary: 116 mg/dL — ABNORMAL HIGH (ref 70–99)
Glucose-Capillary: 161 mg/dL — ABNORMAL HIGH (ref 70–99)
Glucose-Capillary: 266 mg/dL — ABNORMAL HIGH (ref 70–99)
Glucose-Capillary: 264 mg/dL — ABNORMAL HIGH (ref 70–99)

## 2022-08-07 LAB — CK: Total CK: 15865 U/L — ABNORMAL HIGH (ref 38–234)

## 2022-08-07 LAB — MYOGLOBIN, URINE: Myoglobin, Ur: 83 ng/mL — ABNORMAL HIGH (ref 0–13)

## 2022-08-07 LAB — HEPARIN LEVEL (UNFRACTIONATED)
Heparin Unfractionated: 0.13 IU/mL — ABNORMAL LOW (ref 0.30–0.70)
Heparin Unfractionated: 0.18 IU/mL — ABNORMAL LOW (ref 0.30–0.70)
Heparin Unfractionated: 0.16 IU/mL — ABNORMAL LOW (ref 0.30–0.70)

## 2022-08-07 MED ORDER — ACETAMINOPHEN 10 MG/ML IV SOLN
1000.0000 mg | Freq: Three times a day (TID) | INTRAVENOUS | Status: AC | PRN
  Administered 2022-08-07: 1000 mg via INTRAVENOUS
  Filled 2022-08-07 (×2): qty 100

## 2022-08-07 MED ORDER — METOPROLOL TARTRATE 50 MG PO TABS
50.0000 mg | ORAL_TABLET | Freq: Two times a day (BID) | ORAL | Status: DC
  Administered 2022-08-07: 50 mg
  Filled 2022-08-07: qty 1

## 2022-08-07 MED ORDER — HYDROMORPHONE HCL 1 MG/ML IJ SOLN
1.0000 mg | INTRAMUSCULAR | Status: AC | PRN
  Administered 2022-08-07: 1 mg via INTRAVENOUS
  Filled 2022-08-07: qty 1

## 2022-08-07 MED ORDER — METOPROLOL TARTRATE 25 MG PO TABS
25.0000 mg | ORAL_TABLET | Freq: Two times a day (BID) | ORAL | Status: DC
  Administered 2022-08-07: 25 mg
  Filled 2022-08-07: qty 1

## 2022-08-07 MED ORDER — METOPROLOL TARTRATE 25 MG PO TABS: 25.0000 mg | ORAL_TABLET | Freq: Two times a day (BID) | ORAL | Status: DC

## 2022-08-07 MED ORDER — METOPROLOL TARTRATE 25 MG PO TABS
25.0000 mg | ORAL_TABLET | Freq: Once | ORAL | Status: AC
  Administered 2022-08-07: 25 mg

## 2022-08-07 MED ORDER — PIVOT 1.5 CAL PO LIQD
1000.0000 mL | ORAL | Status: DC
  Administered 2022-08-07 – 2022-08-12 (×4): 1000 mL
  Filled 2022-08-07 (×7): qty 1000

## 2022-08-07 MED ORDER — LABETALOL HCL 5 MG/ML IV SOLN
10.0000 mg | INTRAVENOUS | Status: DC | PRN
  Administered 2022-08-07 – 2022-08-08 (×7): 10 mg via INTRAVENOUS
  Filled 2022-08-07 (×7): qty 4

## 2022-08-07 MED ORDER — METOPROLOL TARTRATE 25 MG PO TABS
25.0000 mg | ORAL_TABLET | Freq: Once | ORAL | Status: DC
  Filled 2022-08-07: qty 1

## 2022-08-07 NOTE — Progress Notes (Signed)
ANTICOAGULATION CONSULT NOTE  Pharmacy Consult for Heparin Indication: Critical limb ischemia  Allergies  Allergen Reactions   Iodinated Contrast Media Hives   Sulfa Antibiotics Hives   Shellfish Allergy Nausea And Vomiting, Rash and Other (See Comments)    Scallops specifically cause VOMITING      Patient Measurements: Height: 5' (152.4 cm) Weight: 58.7 kg (129 lb 6.6 oz) IBW/kg (Calculated) : 45.5 Heparin Dosing Weight: 57 kg  Vital Signs: Temp: 99 F (37.2 C) (12/14 2000) Temp Source: Oral (12/14 2000) BP: 155/83 (12/14 2000) Pulse Rate: 106 (12/14 2055)  Labs: Recent Labs    08/05/22 0400 08/05/22 1020 08/05/22 1848 08/06/22 0550 08/06/22 1344 08/06/22 1557 08/07/22 0029 08/07/22 0500 08/07/22 0938 08/07/22 2004  HGB 7.8*  --   --  5.6* 8.4*  --   --  8.3*  --   --   HCT 24.1*  --   --  17.2* 24.8*  --   --  25.3*  --   --   PLT 253  --   --  159  --   --   --  134*  --   --   HEPARINUNFRC 0.47   < >  --  0.71*  --    < > 0.13*  --  0.18* 0.16*  CREATININE 1.53*  --  1.89* 1.89*  --   --   --  1.44*  --   --   CKTOTAL  --   --  11,322*  --   --   --   --  45,409*  --   --    < > = values in this interval not displayed.     Estimated Creatinine Clearance: 29.6 mL/min (A) (by C-G formula based on SCr of 1.44 mg/dL (H)).   Assessment: 79 YOF presenting with intracranial hemorrhage and critical limb ischemia requiring initiation of heparin for emergent thrombectomy/embolectomy. Past medical history: HFpEF, CAD s/p PCI on DAPT, severe AI/AS s/p TAVR, COPD, PAD, HTN. No anticoagulation PTA.   Despite high risk of worsening hemorrhage, CT showed stable ICH; thus, using neuro scale heparin with goal 0.3-0.5 with no bolusing per neurology.   Scr jumped from 0.60 to 1.53 on 12/12 and now stabilized at 1.44 on 12/14. Heparin increased from 500 units/hr to 650 units/hr on 12/14 for subtherapeutic level. Heparin level still found to be subtherapeutic at 0.18. No s/sx  of bleeding.     Heparin still came back subtherapeutic tonight. No issue with drip per Rn. We will increase rate then check in AM.  Goal of Therapy:  Heparin level 0.3-0.5 units/mL Monitor platelets by anticoagulation protocol: Yes   Plan:  Increase heparin infusion from 900 units/hr. Check heparin level in AM Monitor CBC, daily heparin level, s/sx of bleeding.  Onnie Boer, PharmD, BCIDP, AAHIVP, CPP Infectious Disease Pharmacist 08/07/2022 9:05 PM

## 2022-08-07 NOTE — Progress Notes (Signed)
[place] Progress Note Patient Name: Heather [name] DOB: 1950-09-07 MRN: [id number]   Date of Service  08/07/2022  HPI/Events of Note  Bedside RN requesting PRN pain medications, patient appears somnolent already.  eICU Interventions  PRN IV Tylenol ordered x 2 doses.        Kerry Kass Dmitri Pettigrew 08/07/2022, 8:46 PM

## 2022-08-07 NOTE — Progress Notes (Signed)
SLP Cancellation Note  Patient Details Name: Heather [name] MRN: [id number] DOB: 06-Feb-1951   Cancelled treatment:       Reason Eval/Treat Not Completed: Medical issues which prohibited therapy; pt remains intubated; will continue efforts as able.   Elvina Sidle, M.S., CCC-SLP 08/07/2022, 9:57 AM

## 2022-08-07 NOTE — Progress Notes (Signed)
Nutrition Follow-up  DOCUMENTATION CODES:   Not applicable  INTERVENTION:   Tube feeding via Cortrak tube:  Increase Pivot 1.5 to 55 ml/h (1320 ml per day)  Provides 1980 kcal, 123 gm protein, 1003 ml free water daily  Continue MVI with minerals daily   NUTRITION DIAGNOSIS:   Increased nutrient needs related to wound healing as evidenced by estimated needs. Ongoing.   GOAL:   Patient will meet greater than or equal to 90% of their needs Met with TF at goal   MONITOR:   TF tolerance  REASON FOR ASSESSMENT:   Consult Enteral/tube feeding initiation and management  ASSESSMENT:   Pt with PMH of CHF, anxiety, PAD, bipolar, COPD, CAD, depression, DM with neuropathy, GERD, HTN, HLD, schizophrenia admitted with acute ICH and critical limb ischemia.    Pt discussed during ICU rounds and with RN.  Per MD notes, no immediate plan for amputation at this time.  Pt extubated this am. Cortrak remains for ongoing nutrition support.    12/11 - s/p b/l common femoral endarterectomy, iliofemoral endarterectomy, lower extremity embolectomy and stent of b/l common iliac arteries and Rt external iliac artery, and b/l 4 compartment fasciotomies  S/p cortrak placement; per xray tip in distal stomach or proximal duodenum  12/14 - extubated   Medications reviewed and include: colace, SSI, 2 units novolog every 4 hours, MVI with minerals, protonix, miralax, senokot-s  NS @ 75 ml/hr    Labs reviewed:  CK: 15865 A1C: 12 (07/02/22)  CBG's: 175-224  UOP: 1253 ml  I&O: +8 L   Diet Order:   Diet Order             Diet NPO time specified  Diet effective midnight                   EDUCATION NEEDS:      Skin:  Skin Assessment: Skin Integrity Issues: (bilateral fasciotomies) Skin Integrity Issues:: Stage II Stage II: sacrum  Last BM:  unknown  Height:   Ht Readings from Last 1 Encounters:  08/04/22 5' (1.524 m)    Weight:   Wt Readings from Last 1 Encounters:   08/04/22 58.7 kg    BMI:  Body mass index is 25.27 kg/m.  Estimated Nutritional Needs:   Kcal:  1700-1900  Protein:  100-125 grams  Fluid:  >1.7 L/day  Lockie Pares., RD, LDN, CNSC See AMiON for contact information

## 2022-08-07 NOTE — Progress Notes (Signed)
ANTICOAGULATION CONSULT NOTE  Pharmacy Consult for Heparin Indication: Critical limb ischemia  Allergies  Allergen Reactions   Iodinated Contrast Media Hives   Sulfa Antibiotics Hives   Shellfish Allergy Nausea And Vomiting, Rash and Other (See Comments)    Scallops specifically cause VOMITING      Patient Measurements: Height: 5' (152.4 cm) Weight: 58.7 kg (129 lb 6.6 oz) IBW/kg (Calculated) : 45.5 Heparin Dosing Weight: 57 kg  Vital Signs: Temp: 98.9 F (37.2 C) (12/14 0800) Temp Source: Axillary (12/14 0800) BP: 162/145 (12/14 1000) Pulse Rate: 101 (12/14 1000)  Labs: Recent Labs    08/05/22 0400 08/05/22 1020 08/05/22 1848 08/06/22 0550 08/06/22 1344 08/06/22 1557 08/07/22 0029 08/07/22 0500 08/07/22 0938  HGB 7.8*  --   --  5.6* 8.4*  --   --  8.3*  --   HCT 24.1*  --   --  17.2* 24.8*  --   --  25.3*  --   PLT 253  --   --  159  --   --   --  134*  --   HEPARINUNFRC 0.47   < >  --  0.71*  --  0.15* 0.13*  --  0.18*  CREATININE 1.53*  --  1.89* 1.89*  --   --   --  1.44*  --   CKTOTAL  --   --  11,322*  --   --   --   --  84,536*  --    < > = values in this interval not displayed.     Estimated Creatinine Clearance: 29.6 mL/min (A) (by C-G formula based on SCr of 1.44 mg/dL (H)).   Assessment: 25 YOF presenting with intracranial hemorrhage and critical limb ischemia requiring initiation of heparin for emergent thrombectomy/embolectomy. Past medical history: HFpEF, CAD s/p PCI on DAPT, severe AI/AS s/p TAVR, COPD, PAD, HTN. No anticoagulation PTA.   Despite high risk of worsening hemorrhage, CT showed stable ICH; thus, using neuro scale heparin with goal 0.3-0.5 with no bolusing per neurology.   Scr jumped from 0.60 to 1.53 on 12/12 and now stabilized at 1.44 on 12/14. Heparin increased from 500 units/hr to 650 units/hr on 12/14 for subtherapeutic level. Heparin level still found to be subtherapeutic at 0.18. No s/sx of bleeding.     Goal of Therapy:   Heparin level 0.3-0.5 units/mL Monitor platelets by anticoagulation protocol: Yes   Plan:  Increase heparin infusion from 650 to 750 units/hr. Check heparin level at 2000. Monitor CBC, daily heparin level, s/sx of bleeding.  Angus Seller, PharmD Candidate

## 2022-08-07 NOTE — Progress Notes (Signed)
Pt transported from 4N16 to CT01 and back without any complications, RN at bedside.

## 2022-08-07 NOTE — Evaluation (Signed)
Physical Therapy Evaluation Patient Details Name: Heather [name] MRN: [id number] DOB: 1951-04-03 Today's Date: 08/07/2022  History of Present Illness  [name] is a 71 y.o. 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  Found to have mottled LE's after given Narcdipine due to hypertention and went for emergent common femoral and iliofemoral endarterectomy and bilateral 4 compartment fasciotomies on 08/04/22.  Clinical Impression  Patient presents with decreased mobility due to pain in both legs L > R and limited ROM and strength of all 4 extremities.  She was able to hold the rail once assisted to roll to R side, but mobility limited by pain and recent extubation.  Patient previously living with spouse and assisting him.  Feel she will need STSNF level rehab at d/c.  PT will continue to follow until d/c.        Recommendations for follow up therapy are one component of a multi-disciplinary discharge planning process, led by the attending physician.  Recommendations may be updated based on patient status, additional functional criteria and insurance authorization.  Follow Up Recommendations Skilled nursing-short term rehab (<3 hours/day) Can patient physically be transported by private vehicle: No    Assistance Recommended at Discharge Frequent or constant Supervision/Assistance  Patient can return home with the following  Two people to help with walking and/or transfers;Two people to help with bathing/dressing/bathroom    Equipment Recommendations Other (comment) (TBA)  Recommendations for Other Services       Functional Status Assessment Patient has had a recent decline in their functional status and demonstrates the ability to make significant improvements in  function in a reasonable and predictable amount of time.     Precautions / Restrictions Precautions Precautions: Fall Precaution Comments: pain L LE>R with movement Restrictions Weight Bearing Restrictions: No      Mobility  Bed Mobility Overal bed mobility: Needs Assistance Bed Mobility: Rolling Rolling: Max assist         General bed mobility comments: rolled to R for positioning and placed pt hand on rail to help after assisting with bed pad, then rolled again for RN to place sacral pad as pt c/o pain on bottom    Transfers                   General transfer comment: further mobility deferred due to pain and just extubated this am    Ambulation/Gait                  Stairs            Wheelchair Mobility    Modified Rankin (Stroke Patients Only)       Balance                                             Pertinent Vitals/Pain Pain Assessment Pain Assessment: Faces Faces Pain Scale: Hurts whole lot Pain Location: L foot with movement Pain Descriptors / Indicators: Aching, Discomfort, Grimacing, Guarding Pain Intervention(s): Monitored during session, Repositioned    Home Living Family/patient expects to be discharged to:: Private residence Living Arrangements: Spouse/significant other Available Help at Discharge: Family Type of  Home: Mobile home Home Access: Stairs to enter Entrance Stairs-Rails: Psychiatric nurse of Steps: 6   Home Layout: One level Home Equipment: Cane - single point;Wheelchair - Engineer, manufacturing systems (2 wheels) Additional Comments: pt not stating so information taken from previous hospitalization    Prior Function Prior Level of Function : Independent/Modified Independent             Mobility Comments: using cane for mobility; helping her spouse who has dementia       Hand Dominance        Extremity/Trunk Assessment   Upper Extremity Assessment Upper Extremity  Assessment: LUE deficits/detail;RUE deficits/detail RUE Deficits / Details: AAROM grossly WFL though pt not actively moving elbow to face with cues and increased time; edema in hands and pt reports numbness RUE Sensation: decreased light touch LUE Deficits / Details: AAROM grossly WFL though pt not actively moving elbow to face with cues and increased time; edema in hands and pt reports numbness    Lower Extremity Assessment Lower Extremity Assessment: LLE deficits/detail;RLE deficits/detail RLE Deficits / Details: ankle AROM limited but able to move to command, performed quad set x 1 with max cues and time, did not allow me to flex her leg RLE: Unable to fully assess due to pain RLE Sensation: decreased light touch LLE Deficits / Details: moves big toe to command, but painful when assisted for ankle ROM and for knee ROM, no quad set seen with cues and increased time LLE: Unable to fully assess due to pain LLE Sensation: decreased light touch       Communication   Communication: No difficulties  Cognition Arousal/Alertness: Awake/alert Behavior During Therapy: WFL for tasks assessed/performed Overall Cognitive Status: Impaired/Different from baseline Area of Impairment: Orientation, Attention, Following commands, Safety/judgement, Problem solving                 Orientation Level: Disoriented to, Time, Place, Situation Current Attention Level: Sustained   Following Commands: Follows one step commands with increased time, Follows one step commands inconsistently Safety/Judgement: Decreased awareness of safety, Decreased awareness of deficits   Problem Solving: Slow processing General Comments: knew she had a stroke, but not why her legs hurt; eager to go home asap        General Comments      Exercises General Exercises - Upper Extremity Shoulder Flexion: AAROM, 5 reps, Both, Supine Elbow Flexion: AAROM, Both, 5 reps, Supine Elbow Extension: AAROM, Both, 5 reps,  Supine Wrist Flexion: AAROM, Both, 5 reps, Supine Digit Composite Flexion: AAROM, Both, 5 reps, Supine Composite Extension: AAROM, Both, 5 reps, Supine General Exercises - Lower Extremity Ankle Circles/Pumps: PROM, AAROM (2 reps due to pain) Quad Sets: AROM, Right, Supine (one rep actively) Heel Slides: AAROM, Left (2 reps with pain limiting)   Assessment/Plan    PT Assessment Patient needs continued PT services  PT Problem List Decreased range of motion;Decreased mobility;Decreased knowledge of use of DME;Decreased safety awareness;Decreased activity tolerance;Decreased strength;Cardiopulmonary status limiting activity       PT Treatment Interventions DME instruction;Functional mobility training;Balance training;Patient/family education;Therapeutic activities;Neuromuscular re-education;Wheelchair mobility training;Therapeutic exercise;Cognitive remediation    PT Goals (Current goals can be found in the Care Plan section)  Acute Rehab PT Goals Patient Stated Goal: agreeable to rehab PT Goal Formulation: With patient Time For Goal Achievement: 08/21/22 Potential to Achieve Goals: Fair    Frequency Min 3X/week     Co-evaluation               AM-PAC PT "  6 Clicks" Mobility  Outcome Measure Help needed turning from your back to your side while in a flat bed without using bedrails?: Total Help needed moving from lying on your back to sitting on the side of a flat bed without using bedrails?: Total Help needed moving to and from a bed to a chair (including a wheelchair)?: Total Help needed standing up from a chair using your arms (e.g., wheelchair or bedside chair)?: Total Help needed to walk in hospital room?: Total Help needed climbing 3-5 steps with a railing? : Total 6 Click Score: 6    End of Session   Activity Tolerance: Patient limited by pain Patient left: in bed;with call bell/phone within reach   PT Visit Diagnosis: Muscle weakness (generalized)  (M62.81);Pain;Other abnormalities of gait and mobility (R26.89) Pain - Right/Left: Left Pain - part of body: Knee    Time: 1829-9371 PT Time Calculation (min) (ACUTE ONLY): 28 min   Charges:   PT Evaluation $PT Eval High Complexity: 1 High PT Treatments $Therapeutic Exercise: 8-22 mins        Magda Kiel, PT Acute Rehabilitation Services Office:[phone] 08/07/2022   Reginia Naas 08/07/2022, 5:37 PM

## 2022-08-07 NOTE — Progress Notes (Signed)
STROKE TEAM PROGRESS NOTE   INTERVAL HISTORY Daughter is at the bedside. Pt was extubated this am and tolerating well. Open eyes on voice and able to maintain opening, follows some simple commands. BP improved and UOP good now. Right leg warm and per RN some slight movement. Left leg cold from knee down and painful to touch at calf area.    Vitals:   08/07/22 1005 08/07/22 1100 08/07/22 1200 08/07/22 1300  BP:  138/64 (!) 156/70   Pulse:  (!) 102 99 (!) 106  Resp:  '16 14 15  '$ Temp:   99.4 F (37.4 C)   TempSrc:   Axillary   SpO2: 95% 94% 92% 94%  Weight:      Height:       CBC:  Recent Labs  Lab 08/06/22 0550 08/06/22 1344 08/07/22 0500  WBC 13.8*  --  12.8*  HGB 5.6* 8.4* 8.3*  HCT 17.2* 24.8* 25.3*  MCV 93.0  --  90.7  PLT 159  --  259*   Basic Metabolic Panel:  Recent Labs  Lab 08/05/22 0400 08/05/22 1848 08/06/22 0550 08/07/22 0500  NA 140   < > 137 142  K 3.5   < > 4.2 3.9  CL 107   < > 111 115*  CO2 20*   < > 18* 21*  GLUCOSE 218*   < > 235* 186*  BUN 38*   < > 46* 51*  CREATININE 1.53*   < > 1.89* 1.44*  CALCIUM 8.1*   < > 6.9* 6.9*  MG 2.3  --   --   --    < > = values in this interval not displayed.   Lipid Panel:  Recent Labs  Lab 08/05/22 0400  CHOL 96  TRIG 531*  535*  HDL 25*  CHOLHDL 3.8  VLDL UNABLE TO CALCULATE IF TRIGLYCERIDE OVER 400 mg/dL  LDLCALC UNABLE TO CALCULATE IF TRIGLYCERIDE OVER 400 mg/dL   HgbA1c:  Recent Labs  Lab 08/05/22 0400  HGBA1C 12.9*   Urine Drug Screen:  Recent Labs  Lab 08/04/22 1730  LABOPIA POSITIVE*  COCAINSCRNUR NONE DETECTED  LABBENZ POSITIVE*  AMPHETMU NONE DETECTED  THCU NONE DETECTED  LABBARB NONE DETECTED    Alcohol Level  Recent Labs  Lab 08/04/22 0322  ETH <10    IMAGING past 24 hours CT HEAD WO CONTRAST (5MM)  Result Date: 08/07/2022 CLINICAL DATA:  Hemorrhagic stroke follow-up EXAM: CT HEAD WITHOUT CONTRAST TECHNIQUE: Contiguous axial images were obtained from the base of the  skull through the vertex without intravenous contrast. RADIATION DOSE REDUCTION: This exam was performed according to the departmental dose-optimization program which includes automated exposure control, adjustment of the mA and/or kV according to patient size and/or use of iterative reconstruction technique. COMPARISON:  2 days ago FINDINGS: Brain: Cerebellar hematoma in the midline measuring up to 2.9 cm in length, unchanged especially when compared on coronal reformats. No hydrocephalus. Chronic small vessel ischemia in the deep cerebral white matter. Cerebral volume loss with mild ventriculomegaly. Vascular: No hyperdense vessel or unexpected calcification. Skull: Normal. Negative for fracture or focal lesion. Sinuses/Orbits: No acute finding. IMPRESSION: Unchanged cerebellar hematoma.  No hydrocephalus. Electronically Signed   By: Jorje Guild M.D.   On: 08/07/2022 04:39    PHYSICAL EXAM  Physical Exam   Temp:  [98.8 F (37.1 C)-100.7 F (38.2 C)] 99.4 F (37.4 C) (12/14 1200) Pulse Rate:  [81-113] 106 (12/14 1300) Resp:  [11-31] 15 (12/14 1300) BP: (94-170)/(49-145) 156/70 (  12/14 1200) SpO2:  [89 %-98 %] 94 % (12/14 1300) Arterial Line BP: (140-201)/(43-58) 175/52 (12/14 0800) FiO2 (%):  [30 %] 30 % (12/14 0716)  General - Well nourished, well developed, lethargic, complaining b/l LE painful.  Ophthalmologic - fundi not visualized due to noncooperation.  Cardiovascular - Regular rate and rhythm.  Neuro - extubated earlier today, eyes open on voice, able to follow midline commands, and also followed a couple of simple commands on the right hand. Severe dysarthria but orientated to age and place but not time or people, not able to tell me daughter's name. Eyes in mid position, blinking to visual threat bilaterally, tracking bilaterally, PERRL. Facial symmetric.  Tongue protrusion midline. B/l UE 2/5 but symmetrical. B/l no movement but painful on mild palpitation at left LE. Sensation,  coordination not cooperative and gait not tested.    ASSESSMENT/PLAN [name] is a 71 y.o. female with history of  smoker(2ppd), severe AI/AS s/p TAVR, CAD s/p PCI and on DAPT, COPD, PAD, HTN who presented to [facility] ED with sudden onset headache, dizziness while sitting on the toilet having a bowel movement and pushing down. She fell back and hit her back but not her head. 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 She was given DDAVP. She was hypertensive to 680H systolic and was started on Nicardipine gtt. She endorses pain from her thighs down her legs. Her lower extremities are cold to touch and appear a little mottled. On arrival to [facility] ED, vascular surgery consulted and she was emergently taken to the OR. Returned to 4N intubated.   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 70% stenosis of innominate artery origin, bilateral VA origin stenosis Follow up Head CT 12/12 - Stable intraparenchymal hematoma within the cerebellar vermis. Stable moderate mass effect upon the fourth ventricle. MRI brain w wo stable left cerebellar ICH with extension to 4th ventricle. No hydrocephalus Repeat CT 12/14 stable hematoma and no hydro 2D Echo EF 60 to 65% LDL 34 TG 531 HgbA1c 12.9 VTE prophylaxis - heparin IV aspirin 81 mg daily and clopidogrel 75 mg daily prior to admission, now on heparin IV per stroke protocol Therapy recommendations:  pending Disposition:  pending    PAD status post stenting Bilateral lower extremity [place] 2B acute limb ischemia vascular surgery on board S/p b/l common femoral endarterectomy, b/l iliofemoral embolectomy and stent of b/l common iliac arteries, right external iliac artery and left external iliac artery, b/l 4 compartment fasciotomies Left leg cold and painful on palpitation, likely still need amputation Now on heparin IV at stroke goal 0.3-0.5  Acute respiratory failure Extubated 12/14 CCM on board for vent management Off sedation Tolerating well so far  Hypertension -> hypotension -> hypertension Home meds:  metoprolol succinate, losartan BP goal < 160 Was on IV Cleviprex - now off Developed low BP 12/12, s/p 1L bolus NS and lasix overnight Now BP stable on the high end Increase metoprolol 25->50 bid Long  term BP goal normotensive  Hyperlipidemia Home meds:  atorvastatin '80mg'$ , resumed in hospital LDL 34, goal < 70 TG 531 Continue lipitor 40 Continue statin at discharge  Diabetes type II Uncontrolled Home meds:  metformin, insulin, levemir  HgbA1c 12.0, goal < 7.0 Hyperglycemia  CBGs q4 SSI Insulin gtt off   Severe anemia Hb 11.6-9.5-7.8-5.6-PRBC-8.4->8.3 Send post PRBC transfusion No overt bleeding source Continue heparin IV for now May hold off heparin if further bleeding  AKI Rhabdomyolysis Decreased UOP->improved Cre 0.6-1.53-1.89-1.89-1.44 CK [phone] Status post Lasix CCM on board  Dysphagia  Cortrak placed  TF on hold given potential surgical intervention On IVF @ 61  Tobacco abuse Current smoker Smoking cessation counseling will be provided Nicotine patch provided  Other Stroke Risk Factors Advanced Age >/= 57  Coronary artery disease status post PCI  Other Active Problems COPD Status post TAVR Leukocytosis WBC 13.6-18.6-13.8-12.8   Hospital day # 3   Rosalin Hawking, MD PhD Stroke Neurology 08/07/2022 1:31 PM  This patient is critically ill due to  cerebellar ICH, bilateral lower extremity ischemic limbs, hypertension/hypotension, on heparin IV and at significant risk of neurological worsening, death form hematoma expansion, brain herniation, ischemic limbs, septic shock. This patient's care requires constant monitoring of vital signs, hemodynamics, respiratory and cardiac monitoring, review of multiple databases, neurological assessment, discussion with family, other specialists and medical decision making of high complexity. I spent 35 minutes of neurocritical care time in the care of this patient. I had long discussion with daughter at bedside, updated pt current condition, treatment plan and potential prognosis, and answered all the questions.  She expressed understanding and appreciation. I have discussed with Dr. Halford Chessman CCM.      To contact Stroke Continuity provider, please refer to [email or web address] After hours, contact General Neurology

## 2022-08-07 NOTE — Progress Notes (Signed)
ANTICOAGULATION CONSULT NOTE  Pharmacy Consult for Heparin Indication: Critical limb ischemia  Allergies  Allergen Reactions   Iodinated Contrast Media Hives   Sulfa Antibiotics Hives   Shellfish Allergy Nausea And Vomiting, Rash and Other (See Comments)    Scallops specifically cause VOMITING      Patient Measurements: Height: 5' (152.4 cm) Weight: 58.7 kg (129 lb 6.6 oz) IBW/kg (Calculated) : 45.5 Heparin Dosing Weight: 57 kg  Vital Signs: Temp: 100.1 F (37.8 C) (12/14 0000) Temp Source: Axillary (12/14 0000) BP: 100/54 (12/14 0000) Pulse Rate: 83 (12/14 0000)  Labs: Recent Labs    08/05/22 0400 08/05/22 1020 08/05/22 1848 08/06/22 0550 08/06/22 1344 08/06/22 1557 08/07/22 0029  HGB 7.8*  --   --  5.6* 8.4*  --   --   HCT 24.1*  --   --  17.2* 24.8*  --   --   PLT 253  --   --  159  --   --   --   HEPARINUNFRC 0.47   < >  --  0.71*  --  0.15* 0.13*  CREATININE 1.53*  --  1.89* 1.89*  --   --   --   CKTOTAL  --   --  11,322*  --   --   --   --    < > = values in this interval not displayed.     Estimated Creatinine Clearance: 22.5 mL/min (A) (by C-G formula based on SCr of 1.89 mg/dL (H)).   Assessment: 72 YOF presenting with intracranial hemorrhage and critical limb ischemia requiring initiation of heparin for emergent thrombectomy/embolectomy. Past medical history: HFpEF, CAD s/p PCI on DAPT, severe AI/AS s/p TAVR, COPD, PAD, HTN. No anticoagulation PTA.   Despite high risk of worsening hemorrhage, CT showed stable ICH; thus, using neuro scale heparin with goal 0.3-0.5 with no bolusing per neurology.   Scr jumped from 0.60 to 1.53 on 12/12 and more stabilized at 1.89 on 12/13. Heparin level found to be supratherapeutic at 0.71 today on 12/13 likely d/t AKI. No s/sx of bleeding.     12/14 AM update:  Heparin level sub-therapeutic   Goal of Therapy:  Heparin level 0.3-0.5 units/mL Monitor platelets by anticoagulation protocol: Yes   Plan:  Inc  heparin to 650 units/hr 1000 heparin level  Narda Bonds, PharmD, BCPS Clinical Pharmacist Phone: [phone]

## 2022-08-07 NOTE — Progress Notes (Signed)
NAME:  Heather [name], MRN:  [id number], DOB:  07-09-1951, LOS: 3 ADMISSION DATE:  08/04/2022, CONSULTATION DATE:  08/04/2022 REFERRING MD:  Stroke team, CHIEF COMPLAINT:  Headache  History of Present Illness:  71 yo female smoker presented to ER with sudden onset of dizziness, blurred vision, headache, nausea and vomiting.  BP in EMS 209/76.  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  Also found to have bilateral leg pain with numbness and coolness.  Found to have occlusion of b/l common iliac artery stents, Lt SFA and popliteal stent and vascular surgery consulted.  Taken to OR for b/l common femoral endarterectomy, iliofemoral endarterectomy, lower extremity embolectomy and stent of b/l common iliac arteries and Rt external iliac artery, and b/l 4 compartment fasciotomies.  Remained on vent and cleviprex post op.  PCCM consulted to assist with critical care management.  Pertinent  Medical History  HFpEF, Anxiety, PAD, Bipolar, COPD, CAD, Depression, DM with neuropathy, GERD, HLD. HTN, s/p TAVR, Schizophrenia, Vertigo  Significant Hospital Events: Including procedures, antibiotic start and stop dates in addition to other pertinent events   12/11 admit, vascular surgery consulted >> to OR 12/12 off insulin gtt 12/13 oliguria from rhabdo, transfuse 2 units PRBC  Studies:  CT head 12/10 >> acute ICH 11 cc volume in cerebellar vermis MRI brain 12/11 >> Unchanged size of intraparenchymal hematoma centered at the dentate nucleus of the left cerebellum with extension into the fourth ventricle. No hydrocephalus. Echo 12/11 >> EF 60 to 65%, mild MS, s/p TAVR CT head 12/12 >> No progression of the cerebellar hematoma. No hydrocephalus.  CT head 12/14 >> no change in cerebellar hematoma  Interim History / Subjective:  Urine outpt improved.  Weaning on vent.  No signs of bleeding.  Objective   Blood pressure (!) 129/109, pulse 89, temperature 98.8 F (37.1 C), temperature  source Axillary, resp. rate 13, height 5' (1.524 m), weight 58.7 kg, SpO2 97 %. CVP:  [0 mmHg-20 mmHg] 8 mmHg  Vent Mode: PRVC FiO2 (%):  [30 %] 30 % Set Rate:  [18 bmp] 18 bmp Vt Set:  [360 mL] 360 mL PEEP:  [5 cmH20] 5 cmH20 Plateau Pressure:  [15 cmH20] 15 cmH20   Intake/Output Summary (Last 24 hours) at 08/07/2022 0746 Last data filed at 08/07/2022 0700 Gross per 24 hour  Intake 4234.74 ml  Output 1153 ml  Net 3081.74 ml   Filed Weights   08/04/22 1000  Weight: 58.7 kg    Examination:  General - sedated Eyes - pupils reactive ENT - ETT in place Cardiac - regular rate/rhythm, no murmur Chest - equal breath sounds b/l, no wheezing or rales Abdomen - soft, non tender, + bowel sounds Extremities - lower extremities warmer Skin - no rashes Neuro - RASS -1   Resolved Hospital Problem list   Hypertriglyceridemia from diprivan and cleviprex  Assessment & Plan:   Critical limb ischemia s/p b/l common femoral endarterectomy, iliofemoral endarterectomy, lower extremity embolectomy and stent of b/l common iliac arteries and Rt external iliac artery, and b/l 4 compartment fasciotomies. - post op care per vascular surgery - left leg seems improved some >> deferring amputation for now - continue heparin gtt  ICH of cerebellar vermis. Sedation needs while on vent. Hx of depression, bipolar, schizophrenia, insomnia. - no change in neuro-imaging - neurology following - limit sedation in anticipation of extubation - hold outpt lexapro, trazadone  HTN emergency. Hx of CAD, chronic HFpEF, HLD, HTN, s/p TAVR. -  goal SBP < 160 - resume outpt lopress - hold outpt lasix, ASA, plavix, cozaar, lipitor  Compromised airway. Hx of COPD with tobacco abuse. - might be ready for extubation trial soon - goal SpO2 > 92% - f/u CXR intermittently - yupelri, pulmicort, brovana with prn albuterol - nicotine patch  AKI from rhabdomyolysis. Non-gap metabolic acidosis. - urine outpt  improving - continue IV fluid - f/u urine myoglobin, BMET, urine outpt - no indication for renal replacement at this time  DM type 2 poorly controlled with hyperglycemia. DM neuropathy. - SSI - hold outpt lyrica, metformin  Anemia of critical illness. - f/u CBC - transfuse for Hb < 7  Best Practice (right click and "Reselect all SmartList Selections" daily)   Diet/type: tubefeeds DVT prophylaxis: systemic heparin GI prophylaxis: H2B Lines: Central line Foley:  Yes, and it is still needed Code Status:  full code Last date of multidisciplinary goals of care discussion [updated her daughter at bedside]  Labs       Latest Ref Rng & Units 08/07/2022    5:00 AM 08/06/2022    5:50 AM 08/05/2022    6:48 PM  CMP  Glucose 70 - 99 mg/dL 186  235  126   BUN 8 - 23 mg/dL 51  46  41   Creatinine 0.44 - 1.00 mg/dL 1.44  1.89  1.89   Sodium 135 - 145 mmol/L 142  137  139   Potassium 3.5 - 5.1 mmol/L 3.9  4.2  4.1   Chloride 98 - 111 mmol/L 115  111  112   CO2 22 - 32 mmol/L '21  18  18   '$ Calcium 8.9 - 10.3 mg/dL 6.9  6.9  6.9        Latest Ref Rng & Units 08/07/2022    5:00 AM 08/06/2022    1:44 PM 08/06/2022    5:50 AM  CBC  WBC 4.0 - 10.5 K/uL 12.8   13.8   Hemoglobin 12.0 - 15.0 g/dL 8.3  8.4  5.6   Hematocrit 36.0 - 46.0 % 25.3  24.8  17.2   Platelets 150 - 400 K/uL 134   159     ABG    Component Value Date/Time   PHART 7.256 (L) 08/04/2022 1114   PCO2ART 44.1 08/04/2022 1114   PO2ART 227 (H) 08/04/2022 1114   HCO3 19.6 (L) 08/04/2022 1114   TCO2 21 (L) 08/04/2022 1114   ACIDBASEDEF 7.0 (H) 08/04/2022 1114   O2SAT 100 08/04/2022 1114    CBG (last 3)  Recent Labs    08/06/22 1910 08/06/22 2256 08/07/22 0315  GLUCAP 225* 224* 211*    Critical care time: 32 minutes  Chesley Mires, MD [place] Pager - [phone] or [phone] 08/07/2022, 7:46 AM

## 2022-08-07 NOTE — Progress Notes (Signed)
PT Cancellation Note  Patient Details Name: Heather [name] MRN: [id number] DOB: 1951/07/17   Cancelled Treatment:    Reason Eval/Treat Not Completed: Patient not medically ready; just extubated this am.  Will check back as able.    Reginia Naas 08/07/2022, 11:48 AM Magda Kiel, PT Acute Rehabilitation Services Office:[phone] 08/07/2022

## 2022-08-07 NOTE — Procedures (Signed)
Extubation Procedure Note  Patient Details:   Name: Heather [name] DOB: 1951/05/29 MRN: [id number]   Airway Documentation:    Vent end date: 08/07/22 Vent end time: 1005   Evaluation  O2 sats: stable throughout Complications: No apparent complications Patient did tolerate procedure well. Bilateral Breath Sounds: Rhonchi, Diminished   Yes  Pt extubated to 4L [place] per MD order. Positive cuff leak heard prior to extubation. Pt is delayed in following commands such as sticking out her tongue, lifting head off bed, coughing post extubation. Pt encouraged to use [place] to clear secretions. CCM NP called to bedside to evaluate due to possible increased abdominal breathing. Will closely monitor pt for possible need to re-intubate.   Jesse Sans 08/07/2022, 10:13 AM

## 2022-08-07 NOTE — Progress Notes (Signed)
OT Cancellation Note  Patient Details Name: Colisha Redler MRN: [id number] DOB: 28-Jun-1951   Cancelled Treatment:    Reason Eval/Treat Not Completed: Patient not medically ready.  Patient extubated, and being closely monitored for ? Re-intubation.    Enijah Furr D Ivanna Kocak 08/07/2022, 1:35 PM 08/07/2022  RP, OTR/L  Acute Rehabilitation Services  Office:  [phone]

## 2022-08-07 NOTE — Progress Notes (Addendum)
  Progress Note    08/07/2022 7:47 AM 3 Days Post-Op  Subjective:  intubated and sedated   Vitals:   08/07/22 0600 08/07/22 0700  BP: (!) 149/75 (!) 129/109  Pulse: (!) 106 89  Resp: 19 13  Temp:    SpO2: 95% 97%   Physical Exam: Lungs:  mechanical ventilation Incisions:  groin incisions c/d/I; some darkening of R groin skin edge Extremities:  R PT by doppler; no dopplerable flow LLE but warm at the forefoot and ankle Neurologic: A&O  CBC    Component Value Date/Time   WBC 12.8 (H) 08/07/2022 0500   RBC 2.79 (L) 08/07/2022 0500   HGB 8.3 (L) 08/07/2022 0500   HGB 14.3 06/20/2012 2127   HCT 25.3 (L) 08/07/2022 0500   HCT 42.9 06/20/2012 2127   PLT 134 (L) 08/07/2022 0500   PLT 357 06/20/2012 2127   MCV 90.7 08/07/2022 0500   MCV 91 06/20/2012 2127   MCH 29.7 08/07/2022 0500   MCHC 32.8 08/07/2022 0500   RDW 16.0 (H) 08/07/2022 0500   RDW 13.6 06/20/2012 2127   LYMPHSABS 1.8 11/15/2020 0539   MONOABS 0.8 11/15/2020 0539   EOSABS 0.4 11/15/2020 0539   BASOSABS 0.0 11/15/2020 0539    BMET    Component Value Date/Time   NA 142 08/07/2022 0500   NA 135 (L) 06/20/2012 2127   K 3.9 08/07/2022 0500   K 4.0 06/20/2012 2127   CL 115 (H) 08/07/2022 0500   CL 100 06/20/2012 2127   CO2 21 (L) 08/07/2022 0500   CO2 27 06/20/2012 2127   GLUCOSE 186 (H) 08/07/2022 0500   GLUCOSE 250 (H) 06/20/2012 2127   BUN 51 (H) 08/07/2022 0500   BUN 20 (H) 06/20/2012 2127   CREATININE 1.44 (H) 08/07/2022 0500   CREATININE 0.60 06/20/2012 2127   CALCIUM 6.9 (L) 08/07/2022 0500   CALCIUM 8.8 06/20/2012 2127   GFRNONAA 39 (L) 08/07/2022 0500   GFRNONAA >60 06/20/2012 2127   GFRAA >60 06/20/2012 2127    INR    Component Value Date/Time   INR 1.0 08/03/2022 2156     Intake/Output Summary (Last 24 hours) at 08/07/2022 0747 Last data filed at 08/07/2022 0700 Gross per 24 hour  Intake 4234.74 ml  Output 1153 ml  Net 3081.74 ml     Assessment/Plan:  71 y.o. female is  s/p B CFA endarterectomy with bilateral iliac stenting and 4 compartment fasciotomies  3 Days Post-Op   -R foot well perfused by doppler exam -No indication for LLE amp urgently; CK increased however renal function is improving.  Our plan would be to have the patient extubate when appropriate per CCM and assess her motor, sensation, and pain level prior to deciding on amputation -Continue IV heparin      Dagoberto Ligas, PA-C [facility] [phone] 08/07/2022 7:47 AM   I have independently interviewed and examined patient and agree with PA assessment and plan above.  Left foot actually looks clinically viable although I cannot discern any signals there does not appear grossly ischemic.  Right foot is warm and there is a PT signal.  Will evaluate after patient is extubated to assess her neuro function of the left foot.  She remains high risk for left above-knee amputation but no urgent indication at this time.  Continue IV heparin.  Shivali Quackenbush C. Donzetta Matters, MD [facility] [place] Office: [phone] Pager: [phone]

## 2022-08-08 ENCOUNTER — Inpatient Hospital Stay (HOSPITAL_COMMUNITY): Payer: Medicare HMO

## 2022-08-08 ENCOUNTER — Encounter (HOSPITAL_COMMUNITY): Payer: Self-pay | Admitting: Vascular Surgery

## 2022-08-08 DIAGNOSIS — I70223 Atherosclerosis of native arteries of extremities with rest pain, bilateral legs: Secondary | ICD-10-CM | POA: Diagnosis not present

## 2022-08-08 DIAGNOSIS — I614 Nontraumatic intracerebral hemorrhage in cerebellum: Secondary | ICD-10-CM | POA: Diagnosis not present

## 2022-08-08 DIAGNOSIS — I7025 Atherosclerosis of native arteries of other extremities with ulceration: Secondary | ICD-10-CM | POA: Diagnosis not present

## 2022-08-08 DIAGNOSIS — L97509 Non-pressure chronic ulcer of other part of unspecified foot with unspecified severity: Secondary | ICD-10-CM | POA: Diagnosis not present

## 2022-08-08 LAB — CBC
HCT: 25 % — ABNORMAL LOW (ref 36.0–46.0)
Hemoglobin: 8.3 g/dL — ABNORMAL LOW (ref 12.0–15.0)
MCH: 30.5 pg (ref 26.0–34.0)
MCHC: 33.2 g/dL (ref 30.0–36.0)
MCV: 91.9 fL (ref 80.0–100.0)
Platelets: 163 10*3/uL (ref 150–400)
RBC: 2.72 MIL/uL — ABNORMAL LOW (ref 3.87–5.11)
RDW: 16.6 % — ABNORMAL HIGH (ref 11.5–15.5)
WBC: 16.6 10*3/uL — ABNORMAL HIGH (ref 4.0–10.5)
nRBC: 0.2 % (ref 0.0–0.2)

## 2022-08-08 LAB — POCT I-STAT 7, (LYTES, BLD GAS, ICA,H+H)
Acid-base deficit: 5 mmol/L — ABNORMAL HIGH (ref 0.0–2.0)
Bicarbonate: 21.1 mmol/L (ref 20.0–28.0)
Calcium, Ion: 1.19 mmol/L (ref 1.15–1.40)
HCT: 24 % — ABNORMAL LOW (ref 36.0–46.0)
Hemoglobin: 8.2 g/dL — ABNORMAL LOW (ref 12.0–15.0)
O2 Saturation: 74 %
Potassium: 4.2 mmol/L (ref 3.5–5.1)
Sodium: 146 mmol/L — ABNORMAL HIGH (ref 135–145)
TCO2: 22 mmol/L (ref 22–32)
pCO2 arterial: 42.7 mmHg (ref 32–48)
pH, Arterial: 7.302 — ABNORMAL LOW (ref 7.35–7.45)
pO2, Arterial: 43 mmHg — ABNORMAL LOW (ref 83–108)

## 2022-08-08 LAB — BASIC METABOLIC PANEL
Anion gap: 5 (ref 5–15)
Anion gap: 8 (ref 5–15)
BUN: 36 mg/dL — ABNORMAL HIGH (ref 8–23)
BUN: 41 mg/dL — ABNORMAL HIGH (ref 8–23)
CO2: 23 mmol/L (ref 22–32)
CO2: 26 mmol/L (ref 22–32)
Calcium: 7.6 mg/dL — ABNORMAL LOW (ref 8.9–10.3)
Calcium: 8.2 mg/dL — ABNORMAL LOW (ref 8.9–10.3)
Chloride: 118 mmol/L — ABNORMAL HIGH (ref 98–111)
Chloride: 109 mmol/L (ref 98–111)
Creatinine, Ser: 0.93 mg/dL (ref 0.44–1.00)
Creatinine, Ser: 1.09 mg/dL — ABNORMAL HIGH (ref 0.44–1.00)
GFR, Estimated: >60 mL/min (ref >60)
GFR, Estimated: 55 mL/min — ABNORMAL LOW (ref >60)
Glucose, Bld: 250 mg/dL — ABNORMAL HIGH (ref 70–99)
Glucose, Bld: 347 mg/dL — ABNORMAL HIGH (ref 70–99)
Potassium: 3.9 mmol/L (ref 3.5–5.1)
Potassium: 3.3 mmol/L — ABNORMAL LOW (ref 3.5–5.1)
Sodium: 146 mmol/L — ABNORMAL HIGH (ref 135–145)
Sodium: 143 mmol/L (ref 135–145)

## 2022-08-08 LAB — GLUCOSE, CAPILLARY
Glucose-Capillary: 254 mg/dL — ABNORMAL HIGH (ref 70–99)
Glucose-Capillary: 226 mg/dL — ABNORMAL HIGH (ref 70–99)
Glucose-Capillary: 360 mg/dL — ABNORMAL HIGH (ref 70–99)
Glucose-Capillary: 369 mg/dL — ABNORMAL HIGH (ref 70–99)
Glucose-Capillary: 291 mg/dL — ABNORMAL HIGH (ref 70–99)
Glucose-Capillary: 223 mg/dL — ABNORMAL HIGH (ref 70–99)

## 2022-08-08 LAB — HEPARIN LEVEL (UNFRACTIONATED): Heparin Unfractionated: 0.39 IU/mL (ref 0.30–0.70)

## 2022-08-08 LAB — CK: Total CK: 15580 U/L — ABNORMAL HIGH (ref 38–234)

## 2022-08-08 LAB — MAGNESIUM: Magnesium: 2.7 mg/dL — ABNORMAL HIGH (ref 1.7–2.4)

## 2022-08-08 MED ORDER — DEXAMETHASONE SODIUM PHOSPHATE 4 MG/ML IJ SOLN
INTRAMUSCULAR | Status: AC
  Filled 2022-08-08: qty 1

## 2022-08-08 MED ORDER — CARVEDILOL 12.5 MG PO TABS
12.5000 mg | ORAL_TABLET | Freq: Two times a day (BID) | ORAL | Status: DC
  Administered 2022-08-08 – 2022-09-08 (×63): 12.5 mg
  Filled 2022-08-08 (×61): qty 1

## 2022-08-08 MED ORDER — TRAZODONE HCL 50 MG PO TABS
25.0000 mg | ORAL_TABLET | Freq: Every evening | ORAL | Status: DC | PRN
  Administered 2022-08-08 – 2022-09-18 (×26): 25 mg
  Filled 2022-08-08 (×27): qty 1

## 2022-08-08 MED ORDER — POTASSIUM CHLORIDE 20 MEQ PO PACK
40.0000 meq | PACK | Freq: Once | ORAL | Status: AC
  Administered 2022-08-08: 40 meq via ORAL
  Filled 2022-08-08: qty 2

## 2022-08-08 MED ORDER — DEXAMETHASONE SODIUM PHOSPHATE 4 MG/ML IJ SOLN
4.0000 mg | Freq: Once | INTRAMUSCULAR | Status: AC
  Administered 2022-08-08: 4 mg via INTRAVENOUS

## 2022-08-08 MED ORDER — HYDRALAZINE HCL 20 MG/ML IJ SOLN
10.0000 mg | Freq: Four times a day (QID) | INTRAMUSCULAR | Status: DC | PRN
  Administered 2022-08-08 (×2): 10 mg via INTRAVENOUS
  Filled 2022-08-08 (×2): qty 1

## 2022-08-08 MED ORDER — RACEPINEPHRINE HCL 2.25 % IN NEBU
0.5000 mL | INHALATION_SOLUTION | Freq: Once | RESPIRATORY_TRACT | Status: AC
  Administered 2022-08-08: 0.5 mL via RESPIRATORY_TRACT
  Filled 2022-08-08: qty 0.5

## 2022-08-08 MED ORDER — LABETALOL HCL 5 MG/ML IV SOLN
10.0000 mg | Freq: Once | INTRAVENOUS | Status: AC
  Administered 2022-08-08: 10 mg via INTRAVENOUS
  Filled 2022-08-08: qty 4

## 2022-08-08 MED ORDER — INSULIN GLARGINE-YFGN 100 UNIT/ML ~~LOC~~ SOLN
8.0000 [IU] | Freq: Every day | SUBCUTANEOUS | Status: DC
  Administered 2022-08-08 – 2022-08-09 (×2): 8 [IU] via SUBCUTANEOUS
  Filled 2022-08-08 (×3): qty 0.08

## 2022-08-08 MED ORDER — FUROSEMIDE 10 MG/ML IJ SOLN
40.0000 mg | Freq: Four times a day (QID) | INTRAMUSCULAR | Status: AC
  Administered 2022-08-08 (×3): 40 mg via INTRAVENOUS
  Filled 2022-08-08 (×3): qty 4

## 2022-08-08 MED ORDER — CARVEDILOL 12.5 MG PO TABS
12.5000 mg | ORAL_TABLET | Freq: Two times a day (BID) | ORAL | Status: DC
  Filled 2022-08-08: qty 1

## 2022-08-08 MED ORDER — INSULIN ASPART 100 UNIT/ML IJ SOLN
5.0000 [IU] | INTRAMUSCULAR | Status: DC
  Administered 2022-08-08 – 2022-08-11 (×17): 5 [IU] via SUBCUTANEOUS

## 2022-08-08 MED ORDER — ESCITALOPRAM OXALATE 10 MG PO TABS
10.0000 mg | ORAL_TABLET | Freq: Every day | ORAL | Status: DC
  Administered 2022-08-08 – 2022-09-08 (×31): 10 mg
  Filled 2022-08-08 (×31): qty 1

## 2022-08-08 MED ORDER — ORAL CARE MOUTH RINSE
15.0000 mL | OROMUCOSAL | Status: DC
  Administered 2022-08-08 – 2022-09-12 (×125): 15 mL via OROMUCOSAL

## 2022-08-08 MED ORDER — OXYCODONE HCL 5 MG PO TABS
5.0000 mg | ORAL_TABLET | Freq: Four times a day (QID) | ORAL | Status: DC | PRN
  Administered 2022-08-08: 5 mg via ORAL
  Filled 2022-08-08 (×2): qty 1

## 2022-08-08 MED ORDER — LACTATED RINGERS IV SOLN: INTRAVENOUS | Status: DC

## 2022-08-08 NOTE — Inpatient Diabetes Management (Signed)
Inpatient Diabetes Program Recommendations  AACE/ADA: New Consensus Statement on Inpatient Glycemic Control (2015)  Target Ranges:  Prepandial:   less than 140 mg/dL      Peak postprandial:   less than 180 mg/dL (1-2 hours)      Critically ill patients:  140 - 180 mg/dL   Lab Results  Component Value Date   GLUCAP 360 (H) 08/08/2022   HGBA1C 12.9 (H) 08/05/2022    Review of Glycemic Control  Latest Reference Range & Units 08/07/22 07:54 08/07/22 12:11 08/07/22 15:54 08/07/22 19:09 08/07/22 23:20 08/08/22 03:08 08/08/22 08:16 08/08/22 11:17  Glucose-Capillary 70 - 99 mg/dL 175 (H) 116 (H) 161 (H) 266 (H) 264 (H) 254 (H) 226 (H) 360 (H)   Diabetes history: DM 2 Outpatient Diabetes medications: Levemir 18 units qhs, Metformin 750 mg Daily, Novolog 5 units tid Current orders for Inpatient glycemic control:  Semglee 8 units Daily Novolog 0-20 units Q4 hours Novolog 2 units Q4 hours Tube Feed Coverage  A1c 12.9% on 12/12 Pivot Tube Feeds 55 ml/hour Follow pt  Inpatient Diabetes Program Recommendations:    Note: Decadron 4 mg given today, Semglee 8 units added today.  -  Increase Novolog Tube Feed Coverage to 5 units Q4 hours  Thanks,  Tama Headings RN, MSN, BC-ADM Inpatient Diabetes Coordinator Team Pager [phone] (8a-5p)

## 2022-08-08 NOTE — Progress Notes (Signed)
[facility] Progress Note Patient Name: Heather [name] DOB: 01-Nov-1950 MRN: [id number]   Date of Service  08/08/2022  HPI/Events of Note  BP 172/69  eICU Interventions  Labetalol 10 mg iv x 1 ordered.        Kerry Kass Dreshaun Stene 08/08/2022, 2:16 AM

## 2022-08-08 NOTE — Progress Notes (Signed)
NAME:  Heather [name], MRN:  [id number], DOB:  22-Jun-1951, LOS: 4 ADMISSION DATE:  08/04/2022, CONSULTATION DATE:  08/04/2022 REFERRING MD:  Stroke team, CHIEF COMPLAINT:  Headache  History of Present Illness:  71 yo female smoker presented to ER with sudden onset of dizziness, blurred vision, headache, nausea and vomiting.  BP in EMS 209/76.  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  Also found to have bilateral leg pain with numbness and coolness.  Found to have occlusion of b/l common iliac artery stents, Lt SFA and popliteal stent and vascular surgery consulted.  Taken to OR for b/l common femoral endarterectomy, iliofemoral endarterectomy, lower extremity embolectomy and stent of b/l common iliac arteries and Rt external iliac artery, and b/l 4 compartment fasciotomies.  Remained on vent and cleviprex post op.  PCCM consulted to assist with critical care management.  Pertinent  Medical History  HFpEF, Anxiety, PAD, Bipolar, COPD, CAD, Depression, DM with neuropathy, GERD, HLD. HTN, s/p TAVR, Schizophrenia, Vertigo  Significant Hospital Events: Including procedures, antibiotic start and stop dates in addition to other pertinent events   12/11 admit, vascular surgery consulted >> to OR 12/12 off insulin gtt 12/13 oliguria from rhabdo, transfuse 2 units PRBC  Studies:  CT head 12/10 >> acute ICH 11 cc volume in cerebellar vermis MRI brain 12/11 >> Unchanged size of intraparenchymal hematoma centered at the dentate nucleus of the left cerebellum with extension into the fourth ventricle. No hydrocephalus. Echo 12/11 >> EF 60 to 65%, mild MS, s/p TAVR CT head 12/12 >> No progression of the cerebellar hematoma. No hydrocephalus.  CT head 12/14 >> no change in cerebellar hematoma  Interim History / Subjective:   Respiratory issues over night. Was given additiona breath treatments and racemic   Objective   Blood pressure (!) 176/100, pulse (!) 117, temperature 98.1  F (36.7 C), temperature source Oral, resp. rate (!) 23, height 5' (1.524 m), weight 63.5 kg, SpO2 94 %. CVP:  [3 mmHg-11 mmHg] 3 mmHg  FiO2 (%):  [36 %-100 %] 100 %   Intake/Output Summary (Last 24 hours) at 08/08/2022 2355 Last data filed at 08/08/2022 0800 Gross per 24 hour  Intake 3460.93 ml  Output 2450 ml  Net 1010.93 ml   Filed Weights   08/04/22 1000 08/08/22 0500  Weight: 58.7 kg 63.5 kg    Examination:  General - chronically ill appearing fm, receiving neb treatment  Eyes - pupils reactive, tracking  ENT - trachea midline, no stridor  Cardiac - tachy, regular  Chest - basilar crackles, abdominal breathing pattern  Abdomen - soft, nt nd  Extremities - bl LE fasciotomies  Skin - bandages on LE covered from incisions, cdi  Neuro - alert following commands    Resolved Hospital Problem list   Hypertriglyceridemia from diprivan and cleviprex  Assessment & Plan:   Critical limb ischemia s/p b/l common femoral endarterectomy, iliofemoral endarterectomy, lower extremity embolectomy and stent of b/l common iliac arteries and Rt external iliac artery, and b/l 4 compartment fasciotomies. - post op care per vascular surgery - checking ck to r/o muscle necrosis per vascular sx  - continue heparin ggt  ICH of cerebellar vermis. Sedation needs while on vent. Hx of depression, bipolar, schizophrenia, insomnia. - no change in neuro-imaging - neurology following - restart trazodone QHS   HTN emergency. Hx of CAD, chronic HFpEF, HLD, HTN, s/p TAVR. - goal SBP < 160 - holding ASA, plavix, cozaar - holding lipitor until ck  normal  - added lasix '40mg'$  X 3 doses q6h, recheck BMP this evening  - stop metoprolol  - start coreg - continue prn labetalol and hydral for SBP <160   Compromised airway. Hx of COPD with tobacco abuse. - continue triple therapy nebs and prn albuterol  - may need to consider BIPAP if resp status worsens   AKI from rhabdomyolysis. Non-gap metabolic  acidosis. - follow UOP - diuresis today  - holding additional fluids - becomes a tricky situation where she needs volume for the rhabo but her respiratory status is worsening being 9L + CFB - recheck of ck pending this Am  DM type 2 poorly controlled with hyperglycemia. DM neuropathy. - hold metformin  - SSI with CBGs   Anemia of critical illness. - f/u CBC - transfuse for hgb <7   Best Practice (right click and "Reselect all SmartList Selections" daily)   Diet/type: tubefeeds DVT prophylaxis: systemic heparin GI prophylaxis: H2B Lines: Central line Foley:  Yes, and it is still needed Code Status:  full code Last date of multidisciplinary goals of care discussion [updated her daughter at bedside]  Labs       Latest Ref Rng & Units 08/08/2022    4:34 AM 08/07/2022    5:00 AM 08/06/2022    5:50 AM  CMP  Glucose 70 - 99 mg/dL  186  235   BUN 8 - 23 mg/dL  51  46   Creatinine 0.44 - 1.00 mg/dL  1.44  1.89   Sodium 135 - 145 mmol/L 146  142  137   Potassium 3.5 - 5.1 mmol/L 4.2  3.9  4.2   Chloride 98 - 111 mmol/L  115  111   CO2 22 - 32 mmol/L  21  18   Calcium 8.9 - 10.3 mg/dL  6.9  6.9        Latest Ref Rng & Units 08/08/2022    5:09 AM 08/08/2022    4:34 AM 08/07/2022    5:00 AM  CBC  WBC 4.0 - 10.5 K/uL 16.6   12.8   Hemoglobin 12.0 - 15.0 g/dL 8.3  8.2  8.3   Hematocrit 36.0 - 46.0 % 25.0  24.0  25.3   Platelets 150 - 400 K/uL 163   134     ABG    Component Value Date/Time   PHART 7.302 (L) 08/08/2022 0434   PCO2ART 42.7 08/08/2022 0434   PO2ART 43 (L) 08/08/2022 0434   HCO3 21.1 08/08/2022 0434   TCO2 22 08/08/2022 0434   ACIDBASEDEF 5.0 (H) 08/08/2022 0434   O2SAT 74 08/08/2022 0434    CBG (last 3)  Recent Labs    08/07/22 2320 08/08/22 0308 08/08/22 0816  GLUCAP 264* 254* 226*    This patient is critically ill with multiple organ system failure; which, requires frequent high complexity decision making, assessment, support, evaluation,  and titration of therapies. This was completed through the application of advanced monitoring technologies and extensive interpretation of multiple databases. During this encounter critical care time was devoted to patient care services described in this note for 32 minutes.  Garner Nash, DO [facility] 08/08/2022 8:23 AM

## 2022-08-08 NOTE — Progress Notes (Signed)
eLink Physician-Brief Progress Note Patient Name: Heather [name] DOB: 27-Jun-1951 MRN: [id number]   Date of Service  08/08/2022  HPI/Events of Note  Patient with sudden onset of respiratory difficulty predominantly affecting the upper airway.  eICU Interventions  Decadron 4 mg iv, Racemic Epi neb x 1, 100 % non-rebreather, Stat CXR and ABG, PCCM ground crew asked to evaluate patient.        Kerry Kass Barnell Shieh 08/08/2022, 4:42 AM

## 2022-08-08 NOTE — Progress Notes (Addendum)
ANTICOAGULATION CONSULT NOTE  Pharmacy Consult for Heparin Indication: Critical limb ischemia  Allergies  Allergen Reactions   Iodinated Contrast Media Hives   Sulfa Antibiotics Hives   Shellfish Allergy Nausea And Vomiting, Rash and Other (See Comments)    Scallops specifically cause VOMITING      Patient Measurements: Height: 5' (152.4 cm) Weight: 63.5 kg (139 lb 15.9 oz) IBW/kg (Calculated) : 45.5 Heparin Dosing Weight: 57 kg  Vital Signs: Temp: 98.1 F (36.7 C) (12/15 0400) Temp Source: Oral (12/15 0400) BP: 176/100 (12/15 0700) Pulse Rate: 117 (12/15 0700)  Labs: Recent Labs    08/05/22 1848 08/06/22 0550 08/06/22 1344 08/07/22 0500 08/07/22 0938 08/07/22 2004 08/08/22 0434 08/08/22 0509  HGB  --  5.6*   < > 8.3*  --   --  8.2* 8.3*  HCT  --  17.2*   < > 25.3*  --   --  24.0* 25.0*  PLT  --  159  --  134*  --   --   --  163  HEPARINUNFRC  --  0.71*   < >  --  0.18* 0.16*  --  0.39  CREATININE 1.89* 1.89*  --  1.44*  --   --   --   --   CKTOTAL 11,322*  --   --  16,109*  --   --   --   --    < > = values in this interval not displayed.     Estimated Creatinine Clearance: 30.7 mL/min (A) (by C-G formula based on SCr of 1.44 mg/dL (H)).   Assessment: 70 YOF presenting with intracranial hemorrhage and critical limb ischemia requiring initiation of heparin for emergent thrombectomy/embolectomy. Past medical history: HFpEF, CAD s/p PCI on DAPT, severe AI/AS s/p TAVR, COPD, PAD, HTN. No anticoagulation PTA.   Despite high risk of worsening hemorrhage, CT showed stable ICH; thus, using neuro scale heparin with goal 0.3-0.5 with no bolusing per neurology.   Scr jumped from 0.60 to 1.53 on 12/12 and now stabilized at 1.44 on 12/14. Heparin increased from 750 units/hr to 900 units/hr on 12/14 due to subtherapeutic level. Heparin level now therapeutic at 0.39. No s/sx of bleeding.     Goal of Therapy:  Heparin level 0.3-0.5 units/mL Monitor platelets by  anticoagulation protocol: Yes   Plan:  Continue heparin infusion at 900 units/hr. Monitor CBC, daily heparin level, s/sx of bleeding.  Angus Seller, PharmD Candidate 08/08/2022 8:08 AM

## 2022-08-08 NOTE — Progress Notes (Addendum)
STROKE TEAM PROGRESS NOTE   INTERVAL HISTORY No family at the bedside this morning.  Patient remains extubated and is awake and alert.  Right leg is warm and she is moving it.  Left leg is warm to the ankle, no pain with palpation of the foot but some pain in the calf.  She states that she feels better today and she is not in much pain.    Vitals:   08/08/22 1000 08/08/22 1100 08/08/22 1200 08/08/22 1300  BP: (!) 156/74 (!) 141/60 (!) 152/59 (!) 158/82  Pulse: (!) 108 98 97 (!) 105  Resp: (!) 33 (!) 26 (!) 25 (!) 35  Temp:   98 F (36.7 C)   TempSrc:   Axillary   SpO2: (!) 88% 97% 94% 98%  Weight:      Height:       CBC:  Recent Labs  Lab 08/07/22 0500 08/08/22 0434 08/08/22 0509  WBC 12.8*  --  16.6*  HGB 8.3* 8.2* 8.3*  HCT 25.3* 24.0* 25.0*  MCV 90.7  --  91.9  PLT 134*  --  621    Basic Metabolic Panel:  Recent Labs  Lab 08/05/22 0400 08/05/22 1848 08/07/22 0500 08/08/22 0434 08/08/22 0509  NA 140   < > 142 146* 146*  K 3.5   < > 3.9 4.2 3.9  CL 107   < > 115*  --  118*  CO2 20*   < > 21*  --  23  GLUCOSE 218*   < > 186*  --  250*  BUN 38*   < > 51*  --  36*  CREATININE 1.53*   < > 1.44*  --  0.93  CALCIUM 8.1*   < > 6.9*  --  7.6*  MG 2.3  --   --   --  2.7*   < > = values in this interval not displayed.    Lipid Panel:  Recent Labs  Lab 08/05/22 0400  CHOL 96  TRIG 531*  535*  HDL 25*  CHOLHDL 3.8  VLDL UNABLE TO CALCULATE IF TRIGLYCERIDE OVER 400 mg/dL  LDLCALC UNABLE TO CALCULATE IF TRIGLYCERIDE OVER 400 mg/dL    HgbA1c:  Recent Labs  Lab 08/05/22 0400  HGBA1C 12.9*    Urine Drug Screen:  Recent Labs  Lab 08/04/22 1730  LABOPIA POSITIVE*  COCAINSCRNUR NONE DETECTED  LABBENZ POSITIVE*  AMPHETMU NONE DETECTED  THCU NONE DETECTED  LABBARB NONE DETECTED     Alcohol Level  Recent Labs  Lab 08/04/22 0322  ETH <10     IMAGING past 24 hours DG CHEST PORT 1 VIEW  Result Date: 08/08/2022 CLINICAL DATA:  [id number] with  respiratory distress. EXAM: PORTABLE CHEST 1 VIEW COMPARISON:  Portable chest 08/05/2022 FINDINGS: 4:44 a.m. Interval extubation. Feeding tube remains in place with the tip in the body of the stomach. Right IJ central line remains with the tip in the distal SVC. TAVR again is noted. The cardiac size is normal and there is a stent in the LAD coronary artery. There is a normal mediastinal configuration. There is heavy calcification in the aortic arch. Today there is increased central vascular prominence and increased lower zonal interstitial edema. Small pleural effusions have also formed. There is streaky opacity in the infrahilar right lower lung field which could be atelectasis, infiltrate or aspiration. No other focal lung infiltrate is seen. In all other respects no further changes. IMPRESSION: 1. Increased central vascular prominence and lower zonal interstitial edema.  2. Small pleural effusions. 3. Right infrahilar opacity which could be atelectasis, infiltrate or aspiration. 4. Aortic atherosclerosis. 5. Interval extubation. Electronically Signed   By: Telford Nab M.D.   On: 08/08/2022 05:10    PHYSICAL EXAM  Physical Exam   Temp:  [97.7 F (36.5 C)-99 F (37.2 C)] 98 F (36.7 C) (12/15 1200) Pulse Rate:  [87-122] 105 (12/15 1300) Resp:  [12-35] 35 (12/15 1300) BP: (141-176)/(57-127) 158/82 (12/15 1300) SpO2:  [77 %-99 %] 98 % (12/15 1300) FiO2 (%):  [36 %-100 %] 36 % (12/15 0823) Weight:  [63.5 kg] 63.5 kg (12/15 0500)  General - Well nourished, well developed, lethargic, complaining b/l LE painful.  Ophthalmologic - fundi not visualized due to noncooperation.  Cardiovascular - Regular rate and rhythm.  Neuro -awake, alert, and oriented to self, place, year.  She is able to tell me that she is at the hospital because she had a stroke. Able to follow commands Eyes in mid position, blinking to visual threat bilaterally, tracking bilaterally, PERRL. Facial symmetric.  Tongue  protrusion midline.  B/l UE 3/5 but symmetrical. B/l LE wiggles toes more on the right than the left. Sensation-identifies sensation correctly in bilateral upper and lower extremities Coordination-no ataxia noted with gross movement gait not tested.    ASSESSMENT/PLAN [name] is a 71 y.o. female with history of  smoker(2ppd), severe AI/AS s/p TAVR, CAD s/p PCI and on DAPT, COPD, PAD, HTN who presented to [name] ED with sudden onset headache, dizziness while sitting on the toilet having a bowel movement and pushing down. She fell back and hit her back but not her head. 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 She was given DDAVP. She was hypertensive to 161W systolic and was started on Nicardipine gtt. She endorses pain from her thighs down her legs. Her lower extremities are cold to touch and appear a little mottled. On arrival to [facility] ED, vascular surgery consulted and she was emergently taken to the OR. Returned to 4N intubated.   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 70% stenosis of innominate artery origin, bilateral VA origin stenosis Follow up Head CT 12/12 - Stable intraparenchymal hematoma within the cerebellar vermis. Stable moderate mass effect upon the fourth ventricle. MRI brain w wo stable left cerebellar ICH with extension to 4th ventricle. No hydrocephalus Repeat CT 12/14 stable hematoma and no hydro 2D Echo EF 60 to 65% LDL 34 TG  531 HgbA1c 12.9 VTE prophylaxis - heparin IV aspirin 81 mg daily and clopidogrel 75 mg daily prior to admission, now on heparin IV per stroke protocol Therapy recommendations: Skilled nursing facility Disposition:  pending   PAD status post stenting Bilateral lower extremity [place] 2B acute limb ischemia vascular surgery on board S/p b/l common femoral endarterectomy, b/l iliofemoral embolectomy and stent of b/l common iliac arteries, right external iliac artery and left external iliac artery, b/l 4 compartment fasciotomies Left leg cold and painful on palpitation, likely  still need amputation Now on heparin IV at stroke goal 0.3-0.5  Acute respiratory failure Extubated 12/14 CCM on board  Off sedation Status post Lasix Mild tachypnea  Hypertension -> hypotension -> hypertension Home meds:  metoprolol succinate, losartan BP goal < 160 Was on IV Cleviprex - now off Developed low BP 12/12, s/p 1L bolus NS and lasix overnight Now BP stable on the high end On Coreg 12.5 twice daily On Lasix Long term BP goal normotensive  Hyperlipidemia Home meds:  atorvastatin '80mg'$ , resumed in hospital LDL 34, goal < 70 TG 531 Statin on hold due to elevated CK levels Continue statin at discharge if CK normalizes  Diabetes type II Uncontrolled Home meds:  metformin, insulin, levemir  HgbA1c 12.0, goal < 7.0 Hyperglycemia  On insulin subcu CBGs q4 SSI  Severe anemia Hb 11.6-9.5-7.8-5.6-PRBC-8.4->8.3 Send post PRBC transfusion No overt bleeding source Continue heparin IV for now May hold off heparin if further bleeding  AKI Rhabdomyolysis Decreased UOP->improved Cre 0.6-1.53-1.89-1.89-1.44-0.93 CK 11,322->15,865 -> 15,580 Status post Lasix CCM on board  Dysphagia  Cortrak placed  TF @ 60 On LR @ 75  Tobacco abuse Current smoker Smoking cessation counseling will be provided Nicotine patch provided  Other Stroke Risk Factors Advanced Age >/= 60  Coronary artery  disease status post PCI  Other Active Problems COPD Status post TAVR Leukocytosis WBC 13.6-18.6-13.8-12.8->16.6   Hospital day # 4   Patient seen and examined by NP/APP with MD. MD to update note as needed.   Janine Ores, DNP, FNP-BC [facility] Pager: [phone]   ATTENDING NOTE: I reviewed above note and agree with the assessment and plan. Pt was seen and examined.   Grandson are at bedside.  Patient doing remarkably better than yesterday, reclining in bed, eyes open, awake alert, orientated x 4, follows all simple commands, fluent language, mild to moderate dysarthria.  Stated that she wants smashed potatoes and gravy's.  No gaze palsy, visual field full, no nystagmus, facial symmetrical, bilateral upper extremity no drift, finger-to-nose intact grossly bilaterally.  However left lower extremity still cold below the knee, painful on palpation.  However, right lower extremity warm, no pain on palpation.  Able to wiggle toes on the right, flicker toe movement on the left.  Patient neurologically much improved, CT head yesterday shows stable cerebellum hemorrhage and no hydrocephalus.  CCM is working on hyperglycemia, tachypnea, status post Lasix.  And vascular surgery continue to follow ischemic limbs.  Still on heparin IV.  On tube feeding.  WBC continues to be elevated.  For detailed assessment and plan, please refer to above/below as I have made changes wherever appropriate.   Rosalin Hawking, MD PhD Stroke Neurology 08/08/2022 4:54 PM  This patient is critically ill due to cerebellar ICH, bilateral lower extremity ischemic limbs, hypertension/hypotension, on heparin IV and at significant risk of neurological worsening, death form hematoma expansion, brain herniation, ischemic limbs, septic shock. This patient's care requires constant monitoring of vital signs, hemodynamics, respiratory and cardiac monitoring, review of multiple databases, neurological assessment,  discussion with family, other specialists and medical decision making of high complexity. I spent 35 minutes of neurocritical care time in the care of this patient.   To contact Stroke Continuity provider, please refer to [email or web address] After hours, contact General Neurology

## 2022-08-08 NOTE — Progress Notes (Signed)
Physical Therapy Treatment Patient Details Name: Heather [name] MRN: [id number] DOB: Jun 24, 1951 Today's Date: 08/08/2022   History of Present Illness [name] is a 71 y.o. 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  Found to have mottled LE's after given Narcdipine due to hypertention and went for emergent common femoral and iliofemoral endarterectomy and bilateral 4 compartment fasciotomies on 08/04/22.    PT Comments    Patient seen with OT this session, progressing to EOB this session and noted improved RR following up at EOB and cues for deep breathing.  She needed +2 max to total A to move to EOB, and sitting balance with mod to max A.   Patient needing continued skilled PT during acute stay and follow up STSNF level rehab remains appropriate.   Recommendations for follow up therapy are one component of a multi-disciplinary discharge planning process, led by the attending physician.  Recommendations may be updated based on patient status, additional functional criteria and insurance authorization.  Follow Up Recommendations  Skilled nursing-short term rehab (<3 hours/day) Can patient physically be transported by private vehicle: No   Assistance Recommended at Discharge Frequent or constant Supervision/Assistance  Patient can return home with the following Two people to help with walking and/or transfers;Two people to help with bathing/dressing/bathroom   Equipment Recommendations  Other (comment) (TBA)    Recommendations for Other Services       Precautions / Restrictions Precautions Precautions: Fall Precaution Comments: pain L LE>R with movement Restrictions Weight Bearing Restrictions: No     Mobility  Bed Mobility Overal bed mobility: Needs  Assistance Bed Mobility: Supine to Sit, Sit to Supine     Supine to sit: +2 for physical assistance, +2 for safety/equipment, Max assist Sit to supine: +2 for physical assistance, +2 for safety/equipment, Max assist, Total assist   General bed mobility comments: helicoptered with PT to EOB, requiring external support to maintain sitting balance via PT, OT engaging in ADL tasks, deep breathing, and minimal mobility with BLEs to improve activity tolerance, requiring max to total A of 2 to return to supine    Transfers                   General transfer comment: deferred due to pain and decreased activity tolerance    Ambulation/Gait                   Stairs             Wheelchair Mobility    Modified Rankin (Stroke Patients Only) Modified Rankin (Stroke Patients Only) Pre-Morbid Rankin Score: No significant disability Modified Rankin: Severe disability     Balance Overall balance assessment: Needs assistance Sitting-balance support: Feet supported, Bilateral upper extremity supported Sitting balance-Leahy Scale: Zero Sitting balance - Comments: mod to max A for sitting balance up at EOB about 5-6 minutes working on face washing, kicking legs, lifting trunk for deep breaths, etc                                    Cognition Arousal/Alertness: Awake/alert Behavior During Therapy: Anxious Overall Cognitive Status: Impaired/Different from baseline Area of Impairment: Attention, Memory, Following commands, Safety/judgement,  Awareness, Problem solving, Orientation                 Orientation Level: Situation Current Attention Level: Sustained Memory: Decreased short-term memory Following Commands: Follows one step commands with increased time, Follows one step commands inconsistently Safety/Judgement: Decreased awareness of safety, Decreased awareness of deficits Awareness: Emergent Problem Solving: Requires verbal cues, Requires  tactile cues, Slow processing General Comments: increased WOB in session, more anxious with movement, requiring cues for movement due to anxiety        Exercises General Exercises - Lower Extremity Ankle Circles/Pumps: AAROM, Both, 5 reps, AROM Long Arc Quad: AROM, 5 reps, Both, Seated Heel Slides: AAROM, Both, 5 reps, Supine    General Comments General comments (skin integrity, edema, etc.): VSS with RR initially in 30's after treatment in 20's on 4L O2 with SpO2 91% or greater, placed in pseudo chair position at end of session      Pertinent Vitals/Pain Pain Assessment Pain Assessment: Faces Faces Pain Scale: Hurts even more Pain Location: L >R feet with movement Pain Descriptors / Indicators: Aching, Discomfort, Grimacing, Guarding Pain Intervention(s): Monitored during session, Repositioned, Limited activity within patient's tolerance    Home Living Family/patient expects to be discharged to:: Private residence Living Arrangements: Spouse/significant other Available Help at Discharge: Family Type of Home: Mobile home Home Access: Stairs to enter Entrance Stairs-Rails: Psychiatric nurse of Steps: 6   Home Layout: One level Home Equipment: Cane - single point;Wheelchair - Engineer, manufacturing systems (2 wheels) Additional Comments: pt not stating so information taken from previous hospitalization    Prior Function            PT Goals (current goals can now be found in the care plan section) Progress towards PT goals: Progressing toward goals    Frequency    Min 3X/week      PT Plan Current plan remains appropriate    Co-evaluation PT/OT/SLP Co-Evaluation/Treatment: Yes Reason for Co-Treatment: Complexity of the patient's impairments (multi-system involvement);For patient/therapist safety;To address functional/ADL transfers PT goals addressed during session: Balance;Mobility/safety with mobility        AM-PAC PT "6 Clicks" Mobility   Outcome  Measure  Help needed turning from your back to your side while in a flat bed without using bedrails?: Total Help needed moving from lying on your back to sitting on the side of a flat bed without using bedrails?: Total Help needed moving to and from a bed to a chair (including a wheelchair)?: Total Help needed standing up from a chair using your arms (e.g., wheelchair or bedside chair)?: Total Help needed to walk in hospital room?: Total Help needed climbing 3-5 steps with a railing? : Total 6 Click Score: 6    End of Session Equipment Utilized During Treatment: Oxygen Activity Tolerance: Patient limited by fatigue Patient left: in bed;with call bell/phone within reach   PT Visit Diagnosis: Muscle weakness (generalized) (M62.81);Pain;Other abnormalities of gait and mobility (R26.89) Pain - Right/Left: Left Pain - part of body: Knee     Time: 1010-1033 PT Time Calculation (min) (ACUTE ONLY): 23 min  Charges:  $Therapeutic Activity: 8-22 mins                     Magda Kiel, PT Acute Rehabilitation Services Office:[phone] 08/08/2022    Reginia Naas 08/08/2022, 2:01 PM

## 2022-08-08 NOTE — Evaluation (Signed)
Speech Language Pathology Evaluation Patient Details Name: Balinda Heacock MRN: [id number] DOB: Dec 02, 1950 Today's Date: 08/08/2022 Time: 2536-6440 SLP Time Calculation (min) (ACUTE ONLY): 25 min  Problem List:  Patient Active Problem List   Diagnosis Date Noted   ICH (intracerebral hemorrhage) ([place]) 08/04/2022   Atherosclerosis of native arteries of extremity with rest pain ([place]) 04/02/2022   PAD (peripheral artery disease) ([place]) 04/11/2021   Ischemic leg pain 04/11/2021   Chronic mesenteric ischemia ([place]) 12/17/2020   Malnutrition of moderate degree 11/12/2020   Asymptomatic bacteriuria 11/10/2020   Abdominal pain 11/09/2020   Hypokalemia 11/09/2020   AKI (acute kidney injury) ([place]) 11/09/2020   MDD (major depressive disorder), recurrent, in full remission ([place]) 01/16/2020   Anxiety disorder 01/16/2020   Hyperlipidemia 12/15/2019   Carotid stenosis 12/15/2019   MDD (major depressive disorder), recurrent, in partial remission ([place]) 08/24/2019   MDD (major depressive disorder), recurrent episode, mild ([place]) 04/08/2019   Insomnia due to mental condition 04/08/2019   Major neurocognitive disorder due to Alzheimer's disease, probable, without behavioral disturbance 04/08/2019   DDD (degenerative disc disease), cervical 12/19/2016   Frequent falls 12/10/2016   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   Nonrheumatic aortic valve stenosis 09/30/2016   (HFpEF) heart failure with preserved ejection fraction ([place]) 09/20/2016   COPD (chronic obstructive pulmonary disease) ([place]) 09/20/2016   Respiratory failure with hypoxia ([place]) 09/20/2016   NSTEMI (non-ST elevated myocardial infarction) ([place]) 08/21/2016   SOB (shortness of breath) 08/18/2016   Atherosclerosis of native arteries of extremity with intermittent claudication ([place]) 01/08/2016   Palpitations 07/10/2015   History of nonmelanoma skin cancer 03/16/2015   Pseudoaneurysm following procedure ([place]) 07/19/2014    Femoral artery occlusion, right ([place]) 06/28/2014   Claudication ([place]) 06/05/2014   Aortic insufficiency 03/20/2014   CAD (coronary artery disease) 03/20/2014   Chronic pain 03/20/2014   Memory loss 11/09/2012   Depressive disorder 08/02/2012   Neck pain 08/02/2012   Malaise and fatigue 08/02/2012   Hypertension, benign 08/02/2012   Dizziness 08/02/2012   Polyneuropathy in diabetes ([place]) 08/02/2012   Rheumatic fever with heart involvement 08/02/2012   Tobacco use disorder 08/02/2012   Diabetes mellitus ([place]) 08/02/1990   Past Medical History:  Past Medical History:  Diagnosis Date   (HFpEF) heart failure with preserved ejection fraction (HCC)    Angina pectoris (HCC)    Anxiety    Aortic atherosclerosis (HCC)    Basal cell carcinoma    Bilateral carotid artery disease ([place])    a.) carotid doppler 03/18/2021: 3-47% RICA, 42-59% LICA   Bipolar disorder (HCC)    Cervicalgia    Chronic mesenteric ischemia ([place])    a.) s/p PTA 11/13/2020 --> 6 x 16 mm lifestream ballon expanding stent to SMA   COPD (chronic obstructive pulmonary disease) ([place])    Coronary artery disease 03/20/2014   a.) LHC/PCI 03/20/2014: 70% mLAD (2.5 x 28 mm Xience Alpine DES), 70% dLCx, 70% dRCA; b.) LHC 01/25/2016: 90% ISR mLAD --> mLAD dissection with in stent --> 2.75 x 22 mm Resolute DES; c.) R/LHC 09/29/2016: 50% ISR mLAD, 70% dLAD, mPA 37, PCWP 24, AO sat 90%, PA sat 46%, CO 3.7, CI 2.46 - med mgmt   DDD (degenerative disc disease), cervical    Depression    Diabetic polyneuropathy (HCC)    GERD (gastroesophageal reflux disease)    Hyperlipidemia    Hypertension    Long term current use of antithrombotics/antiplatelets    a.) on DAPT (ASA + clopidogrel)   Memory  loss    NSTEMI (non-ST elevated myocardial infarction) ([place]) 07/2016   a.) troponins trended (normal < 0.034 ng/mL): 0.325 --> 1.480 --> 2.330 --> 1.660 --> 0.169 ng/mL   PAD (peripheral artery disease) (HCC)    a.) s/p PTA and stenting SMA  11/13/2020; b.) s/p PTA and stenting of RIGHT SFA and popliteal 04/12/2021; c.) s/p PTA and kissing balloon stents to BILATERAL CIAs 05/28/2021   Respiratory failure with hypoxia (HCC)    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   a.) 23 mm Sapien 3 via suprasternal approach   Schizophrenia ([place])    Severe aortic stenosis    a.) s/p TAVR 09/30/2016; 23 mm Sapien 3 via a suprasternal approach   Type 2 diabetes mellitus treated with insulin (HCC)    Vertigo    Past Surgical History:  Past Surgical History:  Procedure Laterality Date   AORTIC VALVE REPLACEMENT  09/30/2016   Procedure: TRANSCATHETER AORTIC VALVE REPAIR   BLADDER SURGERY     CATARACT EXTRACTION, BILATERAL     CHOLECYSTECTOMY     COLONOSCOPY     CORONARY ANGIOPLASTY WITH STENT PLACEMENT  03/20/2014   Procedure: CORONARY ANGIOPLASTY WITH STENT PLACEMENT; Location: [facility]; Surgeon: Jacques Earthly, MD   CORONARY ANGIOPLASTY WITH STENT PLACEMENT Left 01/25/2016   Procedure: CORONARY ANGIOPLASTY WITH STENT PLACEMENT; Location: [facility]; Surgeon: Jacques Earthly, MD   FOOT SURGERY Right    HEMORRHOID SURGERY     IRRIGATION AND DEBRIDEMENT HEMATOMA Left 07/08/2014   GROIN   LOWER EXTREMITY ANGIOGRAPHY Right 04/12/2021   Procedure: Lower Extremity Angiography;  Surgeon: [name], MD;  Location: [facility];  Service: Cardiovascular;  Laterality: Right;   LOWER EXTREMITY ANGIOGRAPHY Left 05/28/2021   Procedure: LOWER EXTREMITY ANGIOGRAPHY;  Surgeon: [name], MD;  Location: [facility];  Service: Cardiovascular;  Laterality: Left;   LOWER EXTREMITY ANGIOGRAPHY Left 03/25/2022   Procedure: Lower Extremity Angiography;  Surgeon: [name], MD;  Location: [facility];  Service: Cardiovascular;  Laterality: Left;   PSEUDOANEURYSM REPAIR Left 06/30/2014   FEMORAL ARTERY   RIGHT/LEFT HEART CATH AND CORONARY ANGIOGRAPHY Bilateral 09/29/2016   Procedure: RIGHT/LEFT  HEART CATH AND CORONARY ANGIOGRAPHY; Location [facility]; Surgeon: Jacques Earthly, MD   TUBAL LIGATION     VISCERAL ANGIOGRAPHY N/A 11/13/2020   Procedure: VISCERAL ANGIOGRAPHY;  Surgeon: [name], MD;  Location: [facility];  Service: Cardiovascular;  Laterality: N/A;   HPI:  71 yo female smoker presented to ER with sudden onset of dizziness, blurred vision, headache, nausea and vomiting.  BP in EMS 209/76.  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  Also found to have bilateral leg pain with numbness and coolness.  Found to have occlusion of b/l common iliac artery stents, Lt SFA and popliteal stent and vascular surgery consulted.  Taken to OR for b/l common femoral endarterectomy, iliofemoral endarterectomy, lower extremity embolectomy and stent of b/l common iliac arteries and Rt external iliac artery, and b/l 4 compartment fasciotomies.  Remained on vent and cleviprex post op. 12/11-12/14.   Assessment / Plan / Recommendation Clinical Impression  Pt demonstrates cognitive impairment likely associated with ICU delirium and high anxiety. Language in tact, pt able to follow commands. Pt vocal quality clear if breath support applied, pt is at time dyspneic and needs to chunk words to speak. She is mildly disoriented and benefit from visual and situational cues to repeat correct orientation. Pt able to calm  herself after cues to clarify basic wants and needs with simple repositioning for comfort. Will f/u briefly for cognition as pt progresses through acute stay. May benefit from f/u depending on progress.    SLP Assessment  SLP Recommendation/Assessment: Patient needs continued Speech [place] [facility] SLP Visit Diagnosis: Cognitive communication deficit (R41.841)    Recommendations for follow up therapy are one component of a multi-disciplinary discharge planning process, led by the attending physician.  Recommendations may be updated based on  patient status, additional functional criteria and insurance authorization.    Follow Up Recommendations  Skilled nursing-short term rehab (<3 hours/day)    Assistance Recommended at Discharge     Functional Status Assessment Patient has had a recent decline in their functional status and demonstrates the ability to make significant improvements in function in a reasonable and predictable amount of time.  Frequency and Duration min 2x/week  2 weeks      SLP Evaluation Cognition  Orientation Level: Oriented to place;Disoriented to time;Oriented to person;Disoriented to situation Attention: Focused;Sustained Focused Attention: Appears intact Sustained Attention: Appears intact Memory: Impaired Memory Impairment: Decreased short term memory Decreased Short Term Memory: Verbal complex Awareness: Impaired Awareness Impairment: Intellectual impairment;Emergent impairment Problem Solving: Impaired Problem Solving Impairment: Verbal basic;Functional basic       Comprehension  Auditory Comprehension Overall Auditory Comprehension: Appears within functional limits for tasks assessed    Expression Verbal Expression Overall Verbal Expression: Appears within functional limits for tasks assessed   Oral / Motor  Oral Motor/Sensory Function Overall Oral Motor/Sensory Function: Within functional limits Motor Speech Overall Motor Speech: Impaired Respiration: Impaired Level of Impairment: Word Phonation: Other (comment) (becomes dysphonic at times) Resonance: Within functional limits Articulation: Within functional limitis Intelligibility: Intelligible Motor Planning: Witnin functional limits            Estes Lehner, [name] 08/08/2022, 10:59 AM

## 2022-08-08 NOTE — Progress Notes (Addendum)
Progress Note    08/08/2022 7:58 AM 4 Days Post-Op  Subjective:  alert and oriented this morning   Vitals:   08/08/22 0605 08/08/22 0700  BP: (!) 153/75 (!) 176/100  Pulse: (!) 106 (!) 117  Resp: (!) 23 (!) 23  Temp:    SpO2: 95% 94%   Physical Exam: Lungs:  non labored Incisions:  L groin incision c/d/I; R groin lateral edge darkening Extremities:  R foot warm; LLE warm to the ankle; severe pain to light touch of L calf; calf is soft Neurologic: A&O  CBC    Component Value Date/Time   WBC 16.6 (H) 08/08/2022 0509   RBC 2.72 (L) 08/08/2022 0509   HGB 8.3 (L) 08/08/2022 0509   HGB 14.3 06/20/2012 2127   HCT 25.0 (L) 08/08/2022 0509   HCT 42.9 06/20/2012 2127   PLT 163 08/08/2022 0509   PLT 357 06/20/2012 2127   MCV 91.9 08/08/2022 0509   MCV 91 06/20/2012 2127   MCH 30.5 08/08/2022 0509   MCHC 33.2 08/08/2022 0509   RDW 16.6 (H) 08/08/2022 0509   RDW 13.6 06/20/2012 2127   LYMPHSABS 1.8 11/15/2020 0539   MONOABS 0.8 11/15/2020 0539   EOSABS 0.4 11/15/2020 0539   BASOSABS 0.0 11/15/2020 0539    BMET    Component Value Date/Time   NA 146 (H) 08/08/2022 0434   NA 135 (L) 06/20/2012 2127   K 4.2 08/08/2022 0434   K 4.0 06/20/2012 2127   CL 115 (H) 08/07/2022 0500   CL 100 06/20/2012 2127   CO2 21 (L) 08/07/2022 0500   CO2 27 06/20/2012 2127   GLUCOSE 186 (H) 08/07/2022 0500   GLUCOSE 250 (H) 06/20/2012 2127   BUN 51 (H) 08/07/2022 0500   BUN 20 (H) 06/20/2012 2127   CREATININE 1.44 (H) 08/07/2022 0500   CREATININE 0.60 06/20/2012 2127   CALCIUM 6.9 (L) 08/07/2022 0500   CALCIUM 8.8 06/20/2012 2127   GFRNONAA 39 (L) 08/07/2022 0500   GFRNONAA >60 06/20/2012 2127   GFRAA >60 06/20/2012 2127    INR    Component Value Date/Time   INR 1.0 08/03/2022 2156     Intake/Output Summary (Last 24 hours) at 08/08/2022 0758 Last data filed at 08/08/2022 0600 Gross per 24 hour  Intake 3329.52 ml  Output 2450 ml  Net 879.52 ml     Assessment/Plan:   71 y.o. female is s/p B CFA endarterectomy with bilateral iliac stenting and 4 compartment fasciotomies  4 Days Post-Op   R foot warm and well perfused Subjectively L foot is not painful; L calf with pain to light touch concerning for muscle necrosis; recheck CK Encouraged participation with therapy today Continue IV heparin   Dagoberto Ligas, PA-C [facility] [phone] 08/08/2022 7:58 AM  I have independently interviewed and examined patient and agree with PA assessment and plan above.  She actually has a weak posterior tibial signal on the left side this morning and the right side PT is stable.  Her CK remains elevated but renal function has returned to normal.  She has sensation in the left foot although minimal motor.  Possibly CK related to necrotic muscle but given the overall appearance of the foot and leg I would not recommend any additional procedures at this time but certainly would not recommend any revascularization procedures if necessary in the future.  Vascular surgery will continue to follow.  Avelina Mcclurkin C. Donzetta Matters, MD [facility] Office: [phone] Pager: [phone]

## 2022-08-08 NOTE — Evaluation (Signed)
Occupational Therapy Evaluation Patient Details Name: Heather [name] MRN: [id number] DOB: [birth year] Today's Date: 08/08/2022   History of Present Illness Ms. Aydin Cavalieri is a 71 y.o. 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  Found to have mottled LE's after given Narcdipine due to hypertention and went for emergent common femoral and iliofemoral endarterectomy and bilateral 4 compartment fasciotomies on 08/04/22.   Clinical Impression   Prior to this admission, patient living with her spouse, independent in ADLs and mobility (with cane) and still driving. Currently, patient presenting with increased WOB, decreased activity tolerance, pain in BLEs L>R, and need for increased assist with ADLs and mobility. Patient max A for ADLs and max A of 2 for bed mobility with transfers deferred due to weakness and poor pain tolerance in BLEs. Patient benefiting from cues for engage in deep breathing with PT assisting with trunk elevation sitting EOB. OT recommending SNF due to current level of deficits; will continue to follow.      Recommendations for follow up therapy are one component of a multi-disciplinary discharge planning process, led by the attending physician.  Recommendations may be updated based on patient status, additional functional criteria and insurance authorization.   Follow Up Recommendations  Skilled nursing-short term rehab (<3 hours/day)     Assistance Recommended at Discharge Frequent or constant Supervision/Assistance  Patient can return home with the following Two people to help with walking and/or transfers;A lot of help with bathing/dressing/bathroom;Assistance with cooking/housework;Assistance with feeding;Direct supervision/assist for medications  management;Direct supervision/assist for financial management;Assist for transportation;Help with stairs or ramp for entrance    Functional Status Assessment  Patient has had a recent decline in their functional status and demonstrates the ability to make significant improvements in function in a reasonable and predictable amount of time.  Equipment Recommendations  None recommended by OT (Defer to next venue)    Recommendations for Other Services       Precautions / Restrictions Precautions Precautions: Fall Precaution Comments: pain L LE>R with movement Restrictions Weight Bearing Restrictions: No      Mobility Bed Mobility Overal bed mobility: Needs Assistance Bed Mobility: Supine to Sit, Sit to Supine     Supine to sit: +2 for physical assistance, +2 for safety/equipment, Max assist Sit to supine: +2 for physical assistance, +2 for safety/equipment, Max assist   General bed mobility comments: helicoptered with PT to EOB, requiring external support to maintain sitting balance via PT, OT engaging in ADL tasks, deep breathing, and minimal mobility with BLEs to improve activity tolerance, requiring max to total A of 2 to return to supine    Transfers                   General transfer comment: deferred due to pain and decreased activity tolerance      Balance Overall balance assessment: Needs assistance Sitting-balance support: Feet supported, Bilateral upper extremity supported Sitting balance-Leahy Scale: Zero Sitting balance - Comments: requires external assist for sitting balance                                   ADL either performed or assessed with clinical judgement   ADL Overall ADL's :  Needs assistance/impaired Eating/Feeding: Minimal assistance;Bed level   Grooming: Moderate assistance;Sitting;Wash/dry hands;Wash/dry face Grooming Details (indicate cue type and reason): for thoroughness Upper Body Bathing: Moderate  assistance;Sitting   Lower Body Bathing: Maximal assistance;Sitting/lateral leans;Sit to/from stand   Upper Body Dressing : Moderate assistance;Sitting   Lower Body Dressing: Maximal assistance;Total assistance;Sitting/lateral leans;Sit to/from Health and safety inspector Details (indicate cue type and reason): did not attempt         Functional mobility during ADLs: Maximal assistance;+2 for physical assistance;+2 for safety/equipment;Cueing for sequencing;Cueing for safety General ADL Comments: Patient presenting with increased WOB, decreased activity tolerance, pain in BLEs L>R, and need for increased assist with ADLs and mobility     Vision Baseline Vision/History: 1 Wears glasses Ability to See in Adequate Light: 0 Adequate Patient Visual Report: No change from baseline       Perception     Praxis      Pertinent Vitals/Pain Pain Assessment Pain Assessment: Faces Faces Pain Scale: Hurts even more Pain Location: L >R feet with movement Pain Descriptors / Indicators: Aching, Discomfort, Grimacing, Guarding Pain Intervention(s): Limited activity within patient's tolerance, Monitored during session, Repositioned     Hand Dominance Right   Extremity/Trunk Assessment Upper Extremity Assessment Upper Extremity Assessment: Generalized weakness RUE Deficits / Details: edema in hands, decreased AAROM due to lines and BP cuff LUE Deficits / Details: edema in hand, able to bring hand to face with extra time   Lower Extremity Assessment Lower Extremity Assessment: Defer to PT evaluation   Cervical / Trunk Assessment Cervical / Trunk Assessment: Kyphotic (minimally)   Communication Communication Communication: No difficulties   Cognition Arousal/Alertness: Awake/alert Behavior During Therapy: Anxious Overall Cognitive Status: Impaired/Different from baseline Area of Impairment: Attention, Memory, Following commands, Safety/judgement, Awareness, Problem solving,  Orientation                 Orientation Level: Situation Current Attention Level: Sustained Memory: Decreased short-term memory Following Commands: Follows one step commands with increased time, Follows one step commands inconsistently Safety/Judgement: Decreased awareness of safety, Decreased awareness of deficits Awareness: Emergent Problem Solving: Requires verbal cues, Requires tactile cues, Slow processing General Comments: increased WOB in session, more anxious with movement, requiring cues for movement due to anxiety     General Comments  VSS throughout, increased WOB, placed in psuedo chair position at end of session    Exercises Exercises: General Lower Extremity General Exercises - Lower Extremity Ankle Circles/Pumps: PROM, AAROM, Both, 5 reps Long Arc Quad: AROM, 5 reps, Both, Seated   Shoulder Instructions      Home Living Family/patient expects to be discharged to:: Private residence Living Arrangements: Spouse/significant other Available Help at Discharge: Family Type of Home: Mobile home Home Access: Stairs to enter Technical brewer of Steps: 6 Entrance Stairs-Rails: Right;Left Home Layout: One level     Bathroom Shower/Tub: Tub/shower unit         Home Equipment: Cane - single point;Wheelchair - Engineer, manufacturing systems (2 wheels)   Additional Comments: pt not stating so information taken from previous hospitalization      Prior Functioning/Environment Prior Level of Function : Independent/Modified Independent             Mobility Comments: using cane for mobility; helping her spouse who has dementia ADLs Comments: independent and driving        OT Problem List: Decreased strength;Decreased range of motion;Decreased activity tolerance;Impaired balance (sitting and/or standing);Decreased coordination;Decreased cognition;Decreased safety awareness;Decreased knowledge of use of DME or  AE;Decreased knowledge of precautions;Impaired UE  functional use;Impaired sensation;Pain;Increased edema      OT Treatment/Interventions: Self-care/ADL training;Therapeutic exercise;Energy conservation;Neuromuscular education;DME and/or AE instruction;Manual therapy;Therapeutic activities;Cognitive remediation/compensation;Patient/family education;Balance training    OT Goals(Current goals can be found in the care plan section) Acute Rehab OT Goals Patient Stated Goal: to get better OT Goal Formulation: With patient Time For Goal Achievement: 08/22/22 Potential to Achieve Goals: Good  OT Frequency: Min 2X/week    Co-evaluation              AM-PAC OT "6 Clicks" Daily Activity     Outcome Measure Help from another person eating meals?: A Lot Help from another person taking care of personal grooming?: A Lot Help from another person toileting, which includes using toliet, bedpan, or urinal?: A Lot Help from another person bathing (including washing, rinsing, drying)?: A Lot Help from another person to put on and taking off regular upper body clothing?: A Lot Help from another person to put on and taking off regular lower body clothing?: Total 6 Click Score: 11   End of Session Equipment Utilized During Treatment: Oxygen Nurse Communication: Mobility status  Activity Tolerance: Patient limited by fatigue;Patient limited by lethargy;Patient limited by pain Patient left: in bed;with call bell/phone within reach;with bed alarm set  OT Visit Diagnosis: Unsteadiness on feet (R26.81);Other abnormalities of gait and mobility (R26.89);Muscle weakness (generalized) (M62.81);Pain Pain - Right/Left: Left Pain - part of body: Leg                Time: 1010-1038 OT Time Calculation (min): 28 min Charges:  OT General Charges $OT Visit: 1 Visit OT Evaluation $OT Eval Moderate Complexity: 1 Mod  Corinne Ports E. Fadil Macmaster, OTR/L Acute Rehabilitation Services [phone]   Ascencion Dike 08/08/2022, 12:05 PM

## 2022-08-08 NOTE — Evaluation (Signed)
Clinical/Bedside Swallow Evaluation Patient Details  Name: Heather [name] MRN: [id number] Date of Birth: 1950-11-12  Today's Date: 08/08/2022 Time: SLP Start Time (ACUTE ONLY): 0930 SLP Stop Time (ACUTE ONLY): 0945 SLP Time Calculation (min) (ACUTE ONLY): 15 min  Past Medical History:  Past Medical History:  Diagnosis Date   (HFpEF) heart failure with preserved ejection fraction (HCC)    Angina pectoris (HCC)    Anxiety    Aortic atherosclerosis (HCC)    Basal cell carcinoma    Bilateral carotid artery disease ([place])    a.) carotid doppler 03/18/2021: 0-86% RICA, 76-19% LICA   Bipolar disorder (HCC)    Cervicalgia    Chronic mesenteric ischemia (HCC)    a.) s/p PTA 11/13/2020 --> 6 x 16 mm lifestream ballon expanding stent to SMA   COPD (chronic obstructive pulmonary disease) (HCC)    Coronary artery disease 03/20/2014   a.) LHC/PCI 03/20/2014: 70% mLAD (2.5 x 28 mm Xience Alpine DES), 70% dLCx, 70% dRCA; b.) LHC 01/25/2016: 90% ISR mLAD --> mLAD dissection with in stent --> 2.75 x 22 mm Resolute DES; c.) R/LHC 09/29/2016: 50% ISR mLAD, 70% dLAD, mPA 37, PCWP 24, AO sat 90%, PA sat 46%, CO 3.7, CI 2.46 - med mgmt   DDD (degenerative disc disease), cervical    Depression    Diabetic polyneuropathy (HCC)    GERD (gastroesophageal reflux disease)    Hyperlipidemia    Hypertension    Long term current use of antithrombotics/antiplatelets    a.) on DAPT (ASA + clopidogrel)   Memory loss    NSTEMI (non-ST elevated myocardial infarction) ([place]) 07/2016   a.) troponins trended (normal < 0.034 ng/mL): 0.325 --> 1.480 --> 2.330 --> 1.660 --> 0.169 ng/mL   PAD (peripheral artery disease) ([place])    a.) s/p PTA and stenting SMA 11/13/2020; b.) s/p PTA and stenting of RIGHT SFA and popliteal 04/12/2021; c.) s/p PTA and kissing balloon stents to BILATERAL CIAs 05/28/2021   Respiratory failure with hypoxia (HCC)    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   a.) 23 mm Sapien 3  via suprasternal approach   Schizophrenia ([place])    Severe aortic stenosis    a.) s/p TAVR 09/30/2016; 23 mm Sapien 3 via a suprasternal approach   Type 2 diabetes mellitus treated with insulin (HCC)    Vertigo    Past Surgical History:  Past Surgical History:  Procedure Laterality Date   AORTIC VALVE REPLACEMENT  09/30/2016   Procedure: TRANSCATHETER AORTIC VALVE REPAIR   BLADDER SURGERY     CATARACT EXTRACTION, BILATERAL     CHOLECYSTECTOMY     COLONOSCOPY     CORONARY ANGIOPLASTY WITH STENT PLACEMENT  03/20/2014   Procedure: CORONARY ANGIOPLASTY WITH STENT PLACEMENT; Location: [facility]; Surgeon: Jacques Earthly, MD   CORONARY ANGIOPLASTY WITH STENT PLACEMENT Left 01/25/2016   Procedure: CORONARY ANGIOPLASTY WITH STENT PLACEMENT; Location: [facility]; Surgeon: Jacques Earthly, MD   FOOT SURGERY Right    HEMORRHOID SURGERY     IRRIGATION AND DEBRIDEMENT HEMATOMA Left 07/08/2014   GROIN   LOWER EXTREMITY ANGIOGRAPHY Right 04/12/2021   Procedure: Lower Extremity Angiography;  Surgeon: [name], MD;  Location: [place] [facility];  Service: Cardiovascular;  Laterality: Right;   LOWER EXTREMITY ANGIOGRAPHY Left 05/28/2021   Procedure: LOWER EXTREMITY ANGIOGRAPHY;  Surgeon: [name], MD;  Location: [place];  Service: Cardiovascular;  Laterality: Left;   LOWER EXTREMITY ANGIOGRAPHY Left 03/25/2022   Procedure: Lower Extremity Angiography;  Surgeon: Delana Meyer,  Dolores Lory, MD;  Location: [place] [facility];  Service: Cardiovascular;  Laterality: Left;   PSEUDOANEURYSM REPAIR Left 06/30/2014   FEMORAL ARTERY   RIGHT/LEFT HEART CATH AND CORONARY ANGIOGRAPHY Bilateral 09/29/2016   Procedure: RIGHT/LEFT HEART CATH AND CORONARY ANGIOGRAPHY; Location [facility]; Surgeon: Jacques Earthly, MD   TUBAL LIGATION     VISCERAL ANGIOGRAPHY N/A 11/13/2020   Procedure: VISCERAL ANGIOGRAPHY;  Surgeon: [name], MD;  Location: [facility];  Service: Cardiovascular;   Laterality: N/A;   HPI:  71 yo female smoker presented to ER with sudden onset of dizziness, blurred vision, headache, nausea and vomiting.  BP in EMS 209/76.  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  Also found to have bilateral leg pain with numbness and coolness.  Found to have occlusion of b/l common iliac artery stents, Lt SFA and popliteal stent and vascular surgery consulted.  Taken to OR for b/l common femoral endarterectomy, iliofemoral endarterectomy, lower extremity embolectomy and stent of b/l common iliac arteries and Rt external iliac artery, and b/l 4 compartment fasciotomies.  Remained on vent and cleviprex post op. 12/11-12/14.    Assessment / Plan / Recommendation  Clinical Impression  Pt demonstrates what apears to be a primary respiratory based dysphagia with RR above 30 during trials, insufficent breath support for 2 word phonation. Pt denies difficulty swallowing but is obviously fearful of choking, takes very small sips and appears to have difficulty timing breathing and swallowing. There is no immediate coughing, but risk of aspiration with thin liquids is high. Pt tolerates ice and small bites of puree well. She can have these for comfort while relying on NG tube for nutrition and medication until breathing improves. Will f/u for trials and potential FEES. SLP Visit Diagnosis: Dysphagia, oropharyngeal phase (R13.12)    Aspiration Risk  Severe aspiration risk;Risk for inadequate nutrition/hydration    Diet Recommendation NPO;Alternative means - temporary;Ice chips PRN after oral care   Supervision: Full supervision/cueing for compensatory strategies Compensations: Slow rate;Small sips/bites    Other  Recommendations Oral Care Recommendations: Oral care QID Other Recommendations: Have oral suction available    Recommendations for follow up therapy are one component of a multi-disciplinary discharge planning process, led by the attending  physician.  Recommendations may be updated based on patient status, additional functional criteria and insurance authorization.  Follow up Recommendations Skilled nursing-short term rehab (<3 hours/day)      Assistance Recommended at Discharge    Functional Status Assessment Patient has had a recent decline in their functional status and demonstrates the ability to make significant improvements in function in a reasonable and predictable amount of time.  Frequency and Duration min 2x/week  2 weeks       Prognosis Prognosis for Safe Diet Advancement: Good      Swallow Study   General HPI: 71 yo female smoker presented to ER with sudden onset of dizziness, blurred vision, headache, nausea and vomiting.  BP in EMS 209/76.  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  Also found to have bilateral leg pain with numbness and coolness.  Found to have occlusion of b/l common iliac artery stents, Lt SFA and popliteal stent and vascular surgery consulted.  Taken to OR for b/l common femoral endarterectomy, iliofemoral endarterectomy, lower extremity embolectomy and stent of b/l common iliac arteries and Rt external iliac artery, and b/l 4 compartment fasciotomies.  Remained on vent and cleviprex post op. 12/11-12/14. Type of  Study: Bedside Swallow Evaluation Diet Prior to this Study: NPO;NG Tube Temperature Spikes Noted: No Respiratory Status: Nasal cannula History of Recent Intubation: Yes Length of Intubations (days): 4 days Date extubated: 08/07/22 Behavior/Cognition: Alert;Cooperative (anxious) Oral Cavity Assessment: Within Functional Limits Oral Care Completed by SLP: No Oral Cavity - Dentition: Edentulous;Dentures, not available Vision: Functional for self-feeding Self-Feeding Abilities: Needs assist Patient Positioning: Upright in bed Baseline Vocal Quality: Normal Volitional Cough: Weak Volitional Swallow: Able to elicit    Oral/Motor/Sensory Function  Overall Oral Motor/Sensory Function: Within functional limits   Ice Chips Ice chips: Within functional limits Presentation: Spoon   Thin Liquid Thin Liquid: Impaired Presentation: Straw;Cup Pharyngeal  Phase Impairments: Change in Vital Signs;Multiple swallows    Nectar Thick Nectar Thick Liquid: Not tested   Honey Thick Honey Thick Liquid: Not tested   Puree Puree: Impaired Pharyngeal Phase Impairments: Multiple swallows   Solid     Solid: Not tested      Deangelo Berns, [name] 08/08/2022,10:49 AM

## 2022-08-09 DIAGNOSIS — I998 Other disorder of circulatory system: Secondary | ICD-10-CM

## 2022-08-09 DIAGNOSIS — I614 Nontraumatic intracerebral hemorrhage in cerebellum: Secondary | ICD-10-CM | POA: Diagnosis not present

## 2022-08-09 LAB — BASIC METABOLIC PANEL
Anion gap: 6 (ref 5–15)
Anion gap: 6 (ref 5–15)
BUN: 47 mg/dL — ABNORMAL HIGH (ref 8–23)
BUN: 47 mg/dL — ABNORMAL HIGH (ref 8–23)
CO2: 29 mmol/L (ref 22–32)
CO2: 29 mmol/L (ref 22–32)
Calcium: 8.3 mg/dL — ABNORMAL LOW (ref 8.9–10.3)
Calcium: 8.2 mg/dL — ABNORMAL LOW (ref 8.9–10.3)
Chloride: 111 mmol/L (ref 98–111)
Chloride: 110 mmol/L (ref 98–111)
Creatinine, Ser: 1.17 mg/dL — ABNORMAL HIGH (ref 0.44–1.00)
Creatinine, Ser: 0.95 mg/dL (ref 0.44–1.00)
GFR, Estimated: 51 mL/min — ABNORMAL LOW (ref >60)
GFR, Estimated: >60 mL/min (ref >60)
Glucose, Bld: 220 mg/dL — ABNORMAL HIGH (ref 70–99)
Glucose, Bld: 212 mg/dL — ABNORMAL HIGH (ref 70–99)
Potassium: 4 mmol/L (ref 3.5–5.1)
Potassium: 4.2 mmol/L (ref 3.5–5.1)
Sodium: 146 mmol/L — ABNORMAL HIGH (ref 135–145)
Sodium: 145 mmol/L (ref 135–145)

## 2022-08-09 LAB — CBC
HCT: 24.9 % — ABNORMAL LOW (ref 36.0–46.0)
Hemoglobin: 8.5 g/dL — ABNORMAL LOW (ref 12.0–15.0)
MCH: 30.4 pg (ref 26.0–34.0)
MCHC: 34.1 g/dL (ref 30.0–36.0)
MCV: 88.9 fL (ref 80.0–100.0)
Platelets: 230 10*3/uL (ref 150–400)
RBC: 2.8 MIL/uL — ABNORMAL LOW (ref 3.87–5.11)
RDW: 16.2 % — ABNORMAL HIGH (ref 11.5–15.5)
WBC: 21 10*3/uL — ABNORMAL HIGH (ref 4.0–10.5)
nRBC: 0.2 % (ref 0.0–0.2)

## 2022-08-09 LAB — HEPARIN LEVEL (UNFRACTIONATED)
Heparin Unfractionated: 0.26 IU/mL — ABNORMAL LOW (ref 0.30–0.70)
Heparin Unfractionated: <0.1 IU/mL — ABNORMAL LOW (ref 0.30–0.70)

## 2022-08-09 LAB — GLUCOSE, CAPILLARY
Glucose-Capillary: 196 mg/dL — ABNORMAL HIGH (ref 70–99)
Glucose-Capillary: 223 mg/dL — ABNORMAL HIGH (ref 70–99)
Glucose-Capillary: 224 mg/dL — ABNORMAL HIGH (ref 70–99)
Glucose-Capillary: 268 mg/dL — ABNORMAL HIGH (ref 70–99)
Glucose-Capillary: 276 mg/dL — ABNORMAL HIGH (ref 70–99)
Glucose-Capillary: 283 mg/dL — ABNORMAL HIGH (ref 70–99)

## 2022-08-09 LAB — CK: Total CK: 6462 U/L — ABNORMAL HIGH (ref 38–234)

## 2022-08-09 MED ORDER — INSULIN GLARGINE-YFGN 100 UNIT/ML ~~LOC~~ SOLN
12.0000 [IU] | Freq: Every day | SUBCUTANEOUS | Status: DC
  Administered 2022-08-10 – 2022-08-11 (×2): 12 [IU] via SUBCUTANEOUS
  Filled 2022-08-09 (×2): qty 0.12

## 2022-08-09 MED ORDER — FUROSEMIDE 10 MG/ML IJ SOLN: 40.0000 mg | Freq: Four times a day (QID) | INTRAMUSCULAR | Status: DC

## 2022-08-09 MED ORDER — ASPIRIN 81 MG PO TBEC
81.0000 mg | DELAYED_RELEASE_TABLET | Freq: Every day | ORAL | Status: DC
  Administered 2022-08-09 – 2022-08-12 (×4): 81 mg via ORAL
  Filled 2022-08-09 (×4): qty 1

## 2022-08-09 MED ORDER — FUROSEMIDE 10 MG/ML IJ SOLN
40.0000 mg | Freq: Once | INTRAMUSCULAR | Status: AC
  Administered 2022-08-09: 40 mg via INTRAVENOUS
  Filled 2022-08-09: qty 4

## 2022-08-09 MED ORDER — POTASSIUM CHLORIDE 20 MEQ PO PACK
40.0000 meq | PACK | Freq: Once | ORAL | Status: AC
  Administered 2022-08-09: 40 meq via ORAL
  Filled 2022-08-09: qty 2

## 2022-08-09 MED ORDER — OXYCODONE HCL 5 MG PO TABS
5.0000 mg | ORAL_TABLET | ORAL | Status: AC | PRN
  Administered 2022-08-09 (×3): 5 mg via ORAL
  Filled 2022-08-09 (×3): qty 1

## 2022-08-09 MED ORDER — POTASSIUM CHLORIDE 20 MEQ PO PACK: 40.0000 meq | PACK | Freq: Two times a day (BID) | ORAL | Status: DC

## 2022-08-09 NOTE — Progress Notes (Signed)
Speech Language Pathology Treatment: Dysphagia  Patient Details Name: Heather [name] MRN: [id number] DOB: 08/21/1951 Today's Date: 08/09/2022 Time: 1210-1230 SLP Time Calculation (min) (ACUTE ONLY): 20 min  Assessment / Plan / Recommendation Clinical Impression  Pt presents with ongoing s/s aspiration with liquid and solid intake. Respiratory status remains tenuous with increased RR and decreased FiO2 during intake. She is very anxious for PO intake, unfortunately, respiratory status is limiting abilities at this time. Small sips of thin liquid water were provided after oral care with delayed (x1) and immediate (x2) cough response; cough was productive in bringing up mucus as well as water. She had no s/s aspiration with ice chips or pureed. She had small bite of graham cracker, which resulted in delayed cough. Good oral clearance despite edentulous state.   At this time, continue to hold NPO aside from ice chips or trials of pureed from floor stock with supervision. Pt expected to need FEES prior to initiating diet given medical history.    HPI HPI: 71 yo female smoker presented to ER with sudden onset of dizziness, blurred vision, headache, nausea and vomiting.  BP in EMS 209/76.  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  Also found to have bilateral leg pain with numbness and coolness.  Found to have occlusion of b/l common iliac artery stents, Lt SFA and popliteal stent and vascular surgery consulted.  Taken to OR for b/l common femoral endarterectomy, iliofemoral endarterectomy, lower extremity embolectomy and stent of b/l common iliac arteries and Rt external iliac artery, and b/l 4 compartment fasciotomies.  Remained on vent and cleviprex post op. 12/11-12/14.      SLP Plan  New goals to be determined pending instrumental study      Recommendations for follow up therapy are one component of a multi-disciplinary discharge planning process, led by the attending  physician.  Recommendations may be updated based on patient status, additional functional criteria and insurance authorization.    Recommendations   NPO with ice chips for comfort; may have floor stock purees by tsp with supervision         Oral Care Recommendations: Oral care QID SLP Visit Diagnosis: Dysphagia, pharyngeal phase (R13.13) Plan: New goals to be determined pending instrumental study         Lataysha Vohra P. Hollee Fate, M.S., CCC-SLP Speech-Language Pathologist Acute Rehabilitation Services Pager: [place]  08/09/2022, 12:43 PM

## 2022-08-09 NOTE — Progress Notes (Signed)
eLink Physician-Brief Progress Note Patient Name: Heather [name] DOB: May 16, 1951 MRN: [id number]   Date of Service  08/09/2022  HPI/Events of Note  71 y/o F with ICH - Acute cerebellar  with 4th ventricle compression, from straining and vasculopathy in the setting of severe PAD status post stenting Bilateral lower extremity [place] 2B acute limb ischemia. Reports 9/10 pain, unchanged, doppler signal still present   eICU Interventions  -vascular surgery following  -S/p b/l common femoral endarterectomy, b/l iliofemoral embolectomy and stent of b/l common iliac arteries, right external iliac artery and left external iliac artery, b/l 4 compartment fasciotomies -Left leg cold and painful -> expected to need amputation -continue Heparin infusion  -prn Oxycodone         Gentle Hoge N Terriana Barreras 08/09/2022, 3:32 AM

## 2022-08-09 NOTE — Progress Notes (Signed)
ANTICOAGULATION CONSULT NOTE  Pharmacy Consult for Heparin Indication: critical limb ischemia   Allergies  Allergen Reactions   Iodinated Contrast Media Hives   Sulfa Antibiotics Hives   Shellfish Allergy Nausea And Vomiting, Rash and Other (See Comments)    Scallops specifically cause VOMITING      Patient Measurements: Height: 5' (152.4 cm) Weight: 63 kg (138 lb 14.2 oz) IBW/kg (Calculated) : 45.5  Heparin Dosing Weight: 57 kg  Vital Signs: Temp: 98.7 F (37.1 C) (12/16 1200) Temp Source: Oral (12/16 1200) BP: 133/64 (12/16 1200) Pulse Rate: 92 (12/16 1200)  Labs: Recent Labs    08/07/22 0500 08/07/22 0938 08/07/22 2004 08/08/22 0434 08/08/22 0509 08/08/22 1810 08/09/22 0345 08/09/22 0742  HGB 8.3*  --   --  8.2* 8.3*  --  8.5*  --   HCT 25.3*  --   --  24.0* 25.0*  --  24.9*  --   PLT 134*  --   --   --  163  --  230  --   HEPARINUNFRC  --    < > 0.16*  --  0.39  --  0.26*  --   CREATININE 1.44*  --   --   --  0.93 1.09* 1.17*  --   CKTOTAL 19,379*  --   --   --  15,580*  --   --  6,462*   < > = values in this interval not displayed.    Estimated Creatinine Clearance: 37.6 mL/min (A) (by C-G formula based on SCr of 1.17 mg/dL (H)).   Assessment: 71 YO female not on anticoagulation PTA who is admitted for intracranial hemorrhage and critical limb ischemia requiring initiation of heparin for emergent thrombectomy/embolectomy. CT showed stable ICH. Due to high risk of worsening hemorrhage, will set heparin goal 0.3-0.5 with no bolus per neurology.   Goal of Therapy:  Heparin level 0.3-0.5 units/ml Monitor platelets by anticoagulation protocol: Yes   Plan:  Increase heparin infusion to 1000 units/hr Check heparin level in 8 hours and daily while on heparin Continue to monitor H&H and platelets F/u plans for amputation per Vascular surgery recs, possibly early next week    Thank you for allowing pharmacy to be a part of this patient's care.  Ardyth Harps, PharmD Clinical Pharmacist

## 2022-08-09 NOTE — Progress Notes (Signed)
NAME:  Heather [name], MRN:  [id number], DOB:  1951-02-24, LOS: 5 ADMISSION DATE:  08/04/2022, CONSULTATION DATE:  08/04/2022 REFERRING MD:  Stroke team, CHIEF COMPLAINT:  Headache  History of Present Illness:  71 yo female smoker presented to ER with sudden onset of dizziness, blurred vision, headache, nausea and vomiting.  BP in EMS 209/76.  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  Also found to have bilateral leg pain with numbness and coolness.  Found to have occlusion of b/l common iliac artery stents, Lt SFA and popliteal stent and vascular surgery consulted.  Taken to OR for b/l common femoral endarterectomy, iliofemoral endarterectomy, lower extremity embolectomy and stent of b/l common iliac arteries and Rt external iliac artery, and b/l 4 compartment fasciotomies.  Remained on vent and cleviprex post op.  PCCM consulted to assist with critical care management.  Pertinent  Medical History  HFpEF, Anxiety, PAD, Bipolar, COPD, CAD, Depression, DM with neuropathy, GERD, HLD. HTN, s/p TAVR, Schizophrenia, Vertigo  Significant Hospital Events: Including procedures, antibiotic start and stop dates in addition to other pertinent events   12/11 admit, vascular surgery consulted >> to OR 12/12 off insulin gtt 12/13 oliguria from rhabdo, transfuse 2 units PRBC  Studies:  CT head 12/10 >> acute ICH 11 cc volume in cerebellar vermis MRI brain 12/11 >> Unchanged size of intraparenchymal hematoma centered at the dentate nucleus of the left cerebellum with extension into the fourth ventricle. No hydrocephalus. Echo 12/11 >> EF 60 to 65%, mild MS, s/p TAVR CT head 12/12 >> No progression of the cerebellar hematoma. No hydrocephalus.  CT head 12/14 >> no change in cerebellar hematoma  Interim History / Subjective:   Respiratory status improved with diruesis   Objective   Blood pressure (!) 123/57, pulse (!) 101, temperature 98.5 F (36.9 C), temperature source Oral,  resp. rate 19, height 5' (1.524 m), weight 63 kg, SpO2 92 %.    FiO2 (%):  [36 %] 36 %   Intake/Output Summary (Last 24 hours) at 08/09/2022 0728 Last data filed at 08/09/2022 0602 Gross per 24 hour  Intake 3058.04 ml  Output 6025 ml  Net -2966.96 ml   Filed Weights   08/04/22 1000 08/08/22 0500 08/09/22 0500  Weight: 58.7 kg 63.5 kg 63 kg    Examination:  General - eldlery fm, chroncially ill appearing  Eyes - tracking, reactive  ENT - no stridor  Cardiac - RRR, s1 s2  Chest - CTAB, no wheeze  Abdomen - soft nt nd  Extremities - BL LE fasciotomies  Skin - bandages from BL LE fasc  Neuro - alert oriented following commands    Resolved Hospital Problem list   Hypertriglyceridemia from diprivan and cleviprex  Assessment & Plan:   Critical limb ischemia s/p b/l common femoral endarterectomy, iliofemoral endarterectomy, lower extremity embolectomy and stent of b/l common iliac arteries and Rt external iliac artery, and b/l 4 compartment fasciotomies. P: Post op care per vasc surgery  - ck still elevated  - remains on heparin   ICH of cerebellar vermis. Sedation needs while on vent. Hx of depression, bipolar, schizophrenia, insomnia. - no change in neuro-imaging - appreciate neuro recs - trazodone at night   HTN emergency. Hx of CAD, chronic HFpEF, HLD, HTN, s/p TAVR. - goal SBP <160 - holding asa, plavix, cozaar - holding lipitor in setting of elevated CK  - lasix '40mg'$  X 1 doses again today  - continue coreg and prns available for  sbp <160   Compromised airway. Hx of COPD with tobacco abuse. -triple therapy nebs   AKI from rhabdomyolysis. Non-gap metabolic acidosis. - continue ivfs  - continue lasix to maintain euvolemia  - repeat ck this AM   DM type 2 poorly controlled with hyperglycemia. DM neuropathy. - hold metformin  - ssi with cbgs   Anemia of critical illness. - f/u CBC - transfuse for hgb <7   Best Practice (right click and "Reselect all  SmartList Selections" daily)   Diet/type: tubefeeds DVT prophylaxis: systemic heparin GI prophylaxis: H2B Lines: Central line Foley:  Yes, and it is still needed Code Status:  full code Last date of multidisciplinary goals of care discussion. Per primary team   Labs       Latest Ref Rng & Units 08/09/2022    3:45 AM 08/08/2022    6:10 PM 08/08/2022    5:09 AM  CMP  Glucose 70 - 99 mg/dL 220  347  250   BUN 8 - 23 mg/dL 47  41  36   Creatinine 0.44 - 1.00 mg/dL 1.17  1.09  0.93   Sodium 135 - 145 mmol/L 146  143  146   Potassium 3.5 - 5.1 mmol/L 4.0  3.3  3.9   Chloride 98 - 111 mmol/L 111  109  118   CO2 22 - 32 mmol/L '29  26  23   '$ Calcium 8.9 - 10.3 mg/dL 8.3  8.2  7.6        Latest Ref Rng & Units 08/09/2022    3:45 AM 08/08/2022    5:09 AM 08/08/2022    4:34 AM  CBC  WBC 4.0 - 10.5 K/uL 21.0  16.6    Hemoglobin 12.0 - 15.0 g/dL 8.5  8.3  8.2   Hematocrit 36.0 - 46.0 % 24.9  25.0  24.0   Platelets 150 - 400 K/uL 230  163      ABG    Component Value Date/Time   PHART 7.302 (L) 08/08/2022 0434   PCO2ART 42.7 08/08/2022 0434   PO2ART 43 (L) 08/08/2022 0434   HCO3 21.1 08/08/2022 0434   TCO2 22 08/08/2022 0434   ACIDBASEDEF 5.0 (H) 08/08/2022 0434   O2SAT 74 08/08/2022 0434    CBG (last 3)  Recent Labs    08/08/22 2300 08/09/22 0300 08/09/22 0724  GLUCAP 223* 196* 283*    This patient is critically ill with multiple organ system failure; which, requires frequent high complexity decision making, assessment, support, evaluation, and titration of therapies. This was completed through the application of advanced monitoring technologies and extensive interpretation of multiple databases. During this encounter critical care time was devoted to patient care services described in this note for 32 minutes.  Garner Nash, DO [facility] 08/09/2022 7:28 AM

## 2022-08-09 NOTE — Progress Notes (Addendum)
STROKE TEAM PROGRESS NOTE   INTERVAL HISTORY No family at the bedside this morning. Right leg is warm and she is moving it.  Left leg is warm to the ankle, no pain with palpation of the foot but some pain in the calf.  She states that she feels better today and she is not in much pain.  Discussed with Dr. Stanford Breed- will stop heparin gtt and start ASA '81mg'$  today. Consider adding or switching to plavix in a couple of days per Dr. Leonie Man. She will likely still need an amputation once she is neurologically stable unless she becomes septic from her leg. CK is trending down. WBC in increased to 21 today  Plan to move out of ICU and to 4E (preferred floor for vascular patients).    Vitals:   08/09/22 0300 08/09/22 0400 08/09/22 0500 08/09/22 0600  BP: 128/64 119/61 (!) 111/50 (!) 123/57  Pulse: (!) 104 (!) 105 (!) 103 (!) 101  Resp: (!) '22 19 18 19  '$ Temp:  98.5 F (36.9 C)    TempSrc:  Oral    SpO2: 96% 90% 95% 92%  Weight:   63 kg   Height:       CBC:  Recent Labs  Lab 08/08/22 0509 08/09/22 0345  WBC 16.6* 21.0*  HGB 8.3* 8.5*  HCT 25.0* 24.9*  MCV 91.9 88.9  PLT 163 470    Basic Metabolic Panel:  Recent Labs  Lab 08/05/22 0400 08/05/22 1848 08/08/22 0509 08/08/22 1810 08/09/22 0345  NA 140   < > 146* 143 146*  K 3.5   < > 3.9 3.3* 4.0  CL 107   < > 118* 109 111  CO2 20*   < > '23 26 29  '$ GLUCOSE 218*   < > 250* 347* 220*  BUN 38*   < > 36* 41* 47*  CREATININE 1.53*   < > 0.93 1.09* 1.17*  CALCIUM 8.1*   < > 7.6* 8.2* 8.3*  MG 2.3  --  2.7*  --   --    < > = values in this interval not displayed.    Lipid Panel:  Recent Labs  Lab 08/05/22 0400  CHOL 96  TRIG 531*  535*  HDL 25*  CHOLHDL 3.8  VLDL UNABLE TO CALCULATE IF TRIGLYCERIDE OVER 400 mg/dL  LDLCALC UNABLE TO CALCULATE IF TRIGLYCERIDE OVER 400 mg/dL    HgbA1c:  Recent Labs  Lab 08/05/22 0400  HGBA1C 12.9*    Urine Drug Screen:  Recent Labs  Lab 08/04/22 1730  LABOPIA POSITIVE*  COCAINSCRNUR  NONE DETECTED  LABBENZ POSITIVE*  AMPHETMU NONE DETECTED  THCU NONE DETECTED  LABBARB NONE DETECTED     Alcohol Level  Recent Labs  Lab 08/04/22 0322  ETH <10     IMAGING past 24 hours No results found.  PHYSICAL EXAM  Physical Exam   Temp:  [98 F (36.7 C)-98.5 F (36.9 C)] 98.5 F (36.9 C) (12/16 0400) Pulse Rate:  [85-120] 101 (12/16 0600) Resp:  [18-35] 19 (12/16 0600) BP: (98-162)/(50-87) 123/57 (12/16 0600) SpO2:  [88 %-99 %] 92 % (12/16 0600) Weight:  [63 kg] 63 kg (12/16 0500)  General - Well nourished, well developed, lethargic, complaining b/l LE painful.  Ophthalmologic - fundi not visualized due to noncooperation. Lower extremity absent distal pulses in the left leg.  Mottling of the skin over the toes on the left foot.  Left calf and foot is tender to touch Cardiovascular - Regular rate and rhythm.  Neuro -awake, alert, and oriented to self, place, year.  She is able to tell me that she is at the hospital because she had a stroke. Able to follow commands Eyes in mid position, blinking to visual threat bilaterally, tracking bilaterally, PERRL. Facial symmetric.  Tongue protrusion midline.  B/l UE 3/5 but symmetrical. B/l LE wiggles toes more on the right than the left. Sensation-identifies sensation correctly in bilateral upper and lower extremities Coordination-no ataxia noted with gross movement gait not tested.    ASSESSMENT/PLAN Heather [name] is a 71 y.o. female with history of  smoker(2ppd), severe AI/AS s/p TAVR, CAD s/p PCI and on DAPT, COPD, PAD, HTN who presented to [facility] ED with sudden onset headache, dizziness while sitting on the toilet having a bowel movement and pushing down. She fell back and hit her back but not her head. 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 She was given DDAVP. She was hypertensive to 024O systolic and was started on Nicardipine gtt. She endorses pain from her thighs down her legs. Her lower extremities are cold to touch and appear a little mottled. On arrival to [facility] ED, vascular surgery consulted and she was emergently taken to the OR. Returned to 4N intubated.   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 70% stenosis of innominate artery origin, bilateral VA origin stenosis Follow up Head CT 12/12 - Stable intraparenchymal hematoma within the cerebellar vermis. Stable moderate mass effect upon the fourth ventricle. MRI brain w wo stable left cerebellar ICH with extension to 4th ventricle. No hydrocephalus Repeat CT 12/14 stable hematoma and no hydro 2D Echo EF 60 to 65% LDL 34 TG 531 HgbA1c 12.9 VTE prophylaxis - heparin IV aspirin 81 mg daily and clopidogrel 75 mg daily prior to admission, now on heparin IV per stroke protocol -> will switch to ASA '81mg'$   Therapy recommendations: Skilled nursing facility Disposition:  pending   PAD status post stenting Bilateral lower extremity [place] 2B acute limb ischemia vascular surgery on board S/p b/l common femoral endarterectomy, b/l iliofemoral embolectomy and stent of b/l common iliac arteries, right external iliac artery and left external iliac artery, b/l 4 compartment fasciotomies Left leg cold and painful on palpitation, likely still need amputation eventually Now on heparin IV at stroke goal 0.3-0.5  Acute respiratory failure Extubated 12/14 CCM on board  Off sedation Status post Lasix Mild  tachypnea  Hypertension -> hypotension -> hypertension Home meds:  metoprolol succinate, losartan BP goal < 160 Was on IV Cleviprex - now off Developed low BP 12/12, s/p 1L bolus NS and lasix overnight Now BP stable on the high end On Coreg 12.5 twice daily On Lasix Long term BP goal normotensive  Hyperlipidemia Home meds:  atorvastatin '80mg'$ , resumed in hospital LDL 34, goal < 70 TG 531 Statin on hold due to elevated CK levels Continue statin at discharge if CK normalizes  Diabetes type II Uncontrolled Home meds:  metformin, insulin, levemir  HgbA1c  12.0, goal < 7.0 Hyperglycemia  On insulin subcu CBGs q4 SSI  Severe anemia Hb 11.6-9.5-7.8-5.6-PRBC-8.4->8.3 Send post PRBC transfusion No overt bleeding source Continue heparin IV for now May hold off heparin if further bleeding  AKI Rhabdomyolysis Decreased UOP->improved Cre 0.6-1.53-1.89-1.89-1.44-0.93 CK 11,322->15,865 -> 15,580 -> 6462 Status post Lasix CCM on board  Dysphagia  Cortrak placed  TF @ 17 On LR @ 75  Tobacco abuse Current smoker Smoking cessation counseling will be provided Nicotine patch provided  Other Stroke Risk Factors Advanced Age >/= 33  Coronary artery disease status post PCI  Other Active Problems COPD Status post TAVR Leukocytosis WBC 13.6-18.6-13.8-12.8->16.6 -> 21.0   Hospital day # 5   Patient seen and examined by NP/APP with MD. MD to update note as needed.   Janine Ores, DNP, FNP-BC [facility] Pager: [phone]  I have personally obtained history,examined this patient, reviewed notes, independently viewed imaging studies, participated in medical decision making and plan of care.ROS completed by me personally and pertinent positives fully documented  I have made any additions or clarifications directly to the above note. Agree with note above.  Patient continues to be stable from stroke standpoint.  Still has some left leg pain but it is stable.   Vascular surgery feels that his okay to do conservative treatment for lower extremity ischemia over the weekend.  Recommend changing IV heparin to aspirin 81 mg and may consider switching to Plavix later next week.  Continue management of Plavix ischemia as per vascular surgery and CHF and respiratory issues as per critical care team.  Okay to transfer to the floor bed in the next few days if stable.  No family at the bedside.  Discussed with Dr. Valeta Harms critical care medicine. This patient is critically ill and at significant risk of neurological worsening, death and care requires constant monitoring of vital signs, hemodynamics,respiratory and cardiac monitoring, extensive review of multiple databases, frequent neurological assessment, discussion with family, other specialists and medical decision making of high complexity.I have made any additions or clarifications directly to the above note.This critical care time does not reflect procedure time, or teaching time or supervisory time of PA/NP/Med Resident etc but could involve care discussion time.  I spent 30 minutes of neurocritical care time  in the care of  this patient.       Antony Contras, MD Medical Director [facility] Pager: [phone] 08/09/2022 2:33 PM   To contact Stroke Continuity provider, please refer to [email or web address] After hours, contact General Neurology

## 2022-08-09 NOTE — Progress Notes (Addendum)
[facility] [place] PROGRESS NOTE  ASSESSMENT / PLAN: Heather [name] is a 71 y.o. female admitted to neuro ICU for hemorrhagic stroke and bilateral lower extremity acute on chronic ischemia.  She is s/p bilateral lower extremity femoral endarterectomy and patch angiplasty with iliac stenting, four compartment fasciotomies for acute on chronic lower extremity ischemia.   Antiplatelet/anticoagulation management has been challenging. Case discussed in detail with NP Charlean Merl today. Will plan to transition to antiplatelet agents slowly starting today with ASA '81mg'$  PO QD. These will not interrupt or delay amputation, if it is necessary.  Her left lower extremity is ischemic. I cannot find doppler signals today at her left ankle. There is a popliteal doppler signal on the left. Her left calf is mildly tender. Her CK is downtrending, but her WBC is uptrending. She does not have severe sepsis or septic shock. I think it is OK to observe the left leg over the weekend. I suspect she will need an above knee amputation, but would like to delay as long as possible to give her brain time to heal.  From my standpoint, she does not need ICU care, but may have too many complicating factors for a stepdown unit. Will continue to follow closely.   SUBJECTIVE: Doing well overall. Clear and logical. Left leg is painful, but not unbearable. Asking great questions about the plan going forward.   OBJECTIVE: BP 133/64 (BP Location: Right Arm)   Pulse 92   Temp 98.6 F (37 C) (Oral)   Resp (!) 21   Ht 5' (1.524 m)   Wt 63 kg   SpO2 96%   BMI 27.13 kg/m   Intake/Output Summary (Last 24 hours) at 08/09/2022 1223 Last data filed at 08/09/2022 1000 Gross per 24 hour  Intake 2985.58 ml  Output 4775 ml  Net -1789.42 ml    No acute distress. Elderly and chronically ill. Regular rate and rhythm Unlabored breathing Dressings dry No doppler flow in L foot or ankle. L popliteal DS present. Left  foot with pallor and some mild mottling. Brisk doppler flow in R foot     Latest Ref Rng & Units 08/09/2022    3:45 AM 08/08/2022    5:09 AM 08/08/2022    4:34 AM  CBC  WBC 4.0 - 10.5 K/uL 21.0  16.6    Hemoglobin 12.0 - 15.0 g/dL 8.5  8.3  8.2   Hematocrit 36.0 - 46.0 % 24.9  25.0  24.0   Platelets 150 - 400 K/uL 230  163          Latest Ref Rng & Units 08/09/2022    3:45 AM 08/08/2022    6:10 PM 08/08/2022    5:09 AM  CMP  Glucose 70 - 99 mg/dL 220  347  250   BUN 8 - 23 mg/dL 47  41  36   Creatinine 0.44 - 1.00 mg/dL 1.17  1.09  0.93   Sodium 135 - 145 mmol/L 146  143  146   Potassium 3.5 - 5.1 mmol/L 4.0  3.3  3.9   Chloride 98 - 111 mmol/L 111  109  118   CO2 22 - 32 mmol/L '29  26  23   '$ Calcium 8.9 - 10.3 mg/dL 8.3  8.2  7.6     Estimated Creatinine Clearance: 37.6 mL/min (A) (by C-G formula based on SCr of 1.17 mg/dL (H)).  Yevonne Aline. Stanford Breed, MD FACS [facility] Phone Number: [phone]  08/09/2022 12:23 PM

## 2022-08-10 DIAGNOSIS — I70222 Atherosclerosis of native arteries of extremities with rest pain, left leg: Secondary | ICD-10-CM

## 2022-08-10 DIAGNOSIS — I614 Nontraumatic intracerebral hemorrhage in cerebellum: Secondary | ICD-10-CM | POA: Diagnosis not present

## 2022-08-10 LAB — CBC
HCT: 24.5 % — ABNORMAL LOW (ref 36.0–46.0)
Hemoglobin: 7.9 g/dL — ABNORMAL LOW (ref 12.0–15.0)
MCH: 29.6 pg (ref 26.0–34.0)
MCHC: 32.2 g/dL (ref 30.0–36.0)
MCV: 91.8 fL (ref 80.0–100.0)
Platelets: 249 10*3/uL (ref 150–400)
RBC: 2.67 MIL/uL — ABNORMAL LOW (ref 3.87–5.11)
RDW: 16.5 % — ABNORMAL HIGH (ref 11.5–15.5)
WBC: 18.2 10*3/uL — ABNORMAL HIGH (ref 4.0–10.5)
nRBC: 0.6 % — ABNORMAL HIGH (ref 0.0–0.2)

## 2022-08-10 LAB — BASIC METABOLIC PANEL
Anion gap: 6 (ref 5–15)
BUN: 47 mg/dL — ABNORMAL HIGH (ref 8–23)
CO2: 30 mmol/L (ref 22–32)
Calcium: 8.3 mg/dL — ABNORMAL LOW (ref 8.9–10.3)
Chloride: 105 mmol/L (ref 98–111)
Creatinine, Ser: 0.88 mg/dL (ref 0.44–1.00)
GFR, Estimated: >60 mL/min (ref >60)
Glucose, Bld: 257 mg/dL — ABNORMAL HIGH (ref 70–99)
Potassium: 4 mmol/L (ref 3.5–5.1)
Sodium: 141 mmol/L (ref 135–145)

## 2022-08-10 LAB — GLUCOSE, CAPILLARY
Glucose-Capillary: 222 mg/dL — ABNORMAL HIGH (ref 70–99)
Glucose-Capillary: 312 mg/dL — ABNORMAL HIGH (ref 70–99)
Glucose-Capillary: 330 mg/dL — ABNORMAL HIGH (ref 70–99)
Glucose-Capillary: 175 mg/dL — ABNORMAL HIGH (ref 70–99)
Glucose-Capillary: 341 mg/dL — ABNORMAL HIGH (ref 70–99)

## 2022-08-10 MED ORDER — HEPARIN SODIUM (PORCINE) 5000 UNIT/ML IJ SOLN
5000.0000 [IU] | Freq: Three times a day (TID) | INTRAMUSCULAR | Status: DC
  Administered 2022-08-10 – 2022-09-22 (×128): 5000 [IU] via SUBCUTANEOUS
  Filled 2022-08-10 (×123): qty 1

## 2022-08-10 MED ORDER — OXYCODONE-ACETAMINOPHEN 5-325 MG PO TABS
1.0000 | ORAL_TABLET | ORAL | Status: AC | PRN
  Administered 2022-08-10 (×2): 1 via ORAL
  Filled 2022-08-10 (×2): qty 1

## 2022-08-10 MED ORDER — CLOPIDOGREL BISULFATE 75 MG PO TABS
75.0000 mg | ORAL_TABLET | Freq: Every day | ORAL | Status: DC
  Administered 2022-08-10 – 2022-09-22 (×43): 75 mg via ORAL
  Filled 2022-08-10 (×43): qty 1

## 2022-08-10 NOTE — Progress Notes (Signed)
STROKE TEAM PROGRESS NOTE   INTERVAL HISTORY No family at the bedside this morning.  She states that she feels better today and she is not in much pain. -LLE with no palpable or dopplerable pulses Cool foot to touch with mottling of the toes   She will likely still need an amputation later this week as per Dr. Kathaleen Grinder left foot appears a bit worse today. CK is trending down. WBC in decrease to 18 today  Patient now transferred to medical hospitalist team   Vitals:   08/10/22 0334 08/10/22 0346 08/10/22 0805 08/10/22 0839  BP: (!) 136/56  (!) 132/57   Pulse: 88  (!) 106   Resp: 18  18   Temp: 98.9 F (37.2 C)  99 F (37.2 C)   TempSrc: Oral  Oral   SpO2: 92%  92% 96%  Weight:  63.3 kg    Height:       CBC:  Recent Labs  Lab 08/09/22 0345 08/10/22 0325  WBC 21.0* 18.2*  HGB 8.5* 7.9*  HCT 24.9* 24.5*  MCV 88.9 91.8  PLT 230 165   Basic Metabolic Panel:  Recent Labs  Lab 08/05/22 0400 08/05/22 1848 08/08/22 0509 08/08/22 1810 08/09/22 1554 08/10/22 0325  NA 140   < > 146*   < > 145 141  K 3.5   < > 3.9   < > 4.2 4.0  CL 107   < > 118*   < > 110 105  CO2 20*   < > 23   < > 29 30  GLUCOSE 218*   < > 250*   < > 212* 257*  BUN 38*   < > 36*   < > 47* 47*  CREATININE 1.53*   < > 0.93   < > 0.95 0.88  CALCIUM 8.1*   < > 7.6*   < > 8.2* 8.3*  MG 2.3  --  2.7*  --   --   --    < > = values in this interval not displayed.   Lipid Panel:  Recent Labs  Lab 08/05/22 0400  CHOL 96  TRIG 531*  535*  HDL 25*  CHOLHDL 3.8  VLDL UNABLE TO CALCULATE IF TRIGLYCERIDE OVER 400 mg/dL  LDLCALC UNABLE TO CALCULATE IF TRIGLYCERIDE OVER 400 mg/dL   HgbA1c:  Recent Labs  Lab 08/05/22 0400  HGBA1C 12.9*   Urine Drug Screen:  Recent Labs  Lab 08/04/22 1730  LABOPIA POSITIVE*  COCAINSCRNUR NONE DETECTED  LABBENZ POSITIVE*  AMPHETMU NONE DETECTED  THCU NONE DETECTED  LABBARB NONE DETECTED    Alcohol Level  Recent Labs  Lab 08/04/22 0322  ETH <10    IMAGING past  24 hours No results found.  PHYSICAL EXAM  Physical Exam   Temp:  [98.1 F (36.7 C)-99 F (37.2 C)] 99 F (37.2 C) (12/17 0805) Pulse Rate:  [81-106] 106 (12/17 0805) Resp:  [16-22] 18 (12/17 0805) BP: (112-144)/(56-72) 132/57 (12/17 0805) SpO2:  [92 %-100 %] 96 % (12/17 0839) Weight:  [63.3 kg] 63.3 kg (12/17 0346)  General - Well nourished, well developed, pleasant middle-age Caucasian lady.  Ophthalmologic - fundi not visualized due to noncooperation. Lower extremity absent distal pulses in the left leg.  Mottling of the skin over the toes on the left foot.  Left calf and foot is tender to touch Cardiovascular - Regular rate and rhythm.  Neuro -awake, alert, and oriented to self, place, year.  She is able to tell me  that she is at the hospital because she had a stroke. Able to follow commands Eyes in mid position, blinking to visual threat bilaterally, tracking bilaterally, PERRL. Facial symmetric.  Tongue protrusion midline.  B/l UE 3/5 but symmetrical. B/l LE wiggles toes more on the right than the left. Sensation-identifies sensation correctly in bilateral upper and lower extremities Coordination-no ataxia noted with gross movement gait not tested.    ASSESSMENT/PLAN Heather [name] is a 71 y.o. female with history of  smoker(2ppd), severe AI/AS s/p TAVR, CAD s/p PCI and on DAPT, COPD, PAD, HTN who presented to [facility] ED with sudden onset headache, dizziness while sitting on the toilet having a bowel movement and pushing down. She fell back and hit her back but not her head. 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 She was given DDAVP. She was hypertensive to 841L systolic and was started on Nicardipine gtt. She endorses pain from her thighs down her legs. Her lower extremities are  cold to touch and appear a little mottled. On arrival to [facility] ED, vascular surgery consulted and she was emergently taken to the OR. Returned to 4N intubated.   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 70% stenosis of innominate artery origin, bilateral VA origin stenosis Follow up Head CT 12/12 - Stable intraparenchymal hematoma within the cerebellar vermis. Stable moderate mass effect upon the fourth ventricle. MRI brain w wo stable left cerebellar ICH with extension to 4th ventricle. No hydrocephalus Repeat CT 12/14 stable hematoma and no hydro 2D Echo EF 60 to 65% LDL 34 TG 531 HgbA1c 12.9 VTE prophylaxis - heparin IV aspirin 81 mg daily and clopidogrel 75 mg daily prior to admission, now on heparin IV per stroke protocol -> will switch to ASA '81mg'$   Therapy recommendations: Skilled nursing facility Disposition:  pending   PAD status post stenting Bilateral lower extremity [place] 2B acute limb ischemia vascular surgery on board S/p b/l common femoral endarterectomy, b/l iliofemoral embolectomy and stent of b/l common iliac arteries, right external iliac artery and left external iliac artery, b/l 4 compartment fasciotomies Left leg cold and painful on palpitation, likely still need amputation eventually Now on heparin IV at stroke goal 0.3-0.5  Acute respiratory failure Extubated 12/14 CCM on board  Off sedation Status post Lasix Mild tachypnea  Hypertension -> hypotension -> hypertension Home meds:  metoprolol succinate, losartan BP goal < 160 Was on IV Cleviprex - now off Developed low BP 12/12, s/p 1L bolus NS and lasix  overnight Now BP stable on the high end On Coreg 12.5 twice daily On Lasix Long term BP goal normotensive  Hyperlipidemia Home meds:  atorvastatin '80mg'$ , resumed in hospital LDL 34, goal < 70 TG 531 Statin on hold due to elevated CK levels Continue statin at discharge if CK normalizes  Diabetes type II Uncontrolled Home meds:  metformin, insulin, levemir  HgbA1c 12.0, goal < 7.0 Hyperglycemia  On insulin subcu CBGs q4 SSI  Severe anemia Hb  11.6-9.5-7.8-5.6-PRBC-8.4->8.3 Send post PRBC transfusion No overt bleeding source Continue heparin IV for now May hold off heparin if further bleeding  AKI Rhabdomyolysis Decreased UOP->improved Cre 0.6-1.53-1.89-1.89-1.44-0.93 CK 11,322->15,865 -> 15,580 -> 6462 Status post Lasix CCM on board  Dysphagia  Cortrak placed  TF @ 50 On LR @ 75  Tobacco abuse Current smoker Smoking cessation counseling will be provided Nicotine patch provided  Other Stroke Risk Factors Advanced Age >/= 5  Coronary artery disease status post PCI  Other Active Problems COPD Status post TAVR Leukocytosis WBC 13.6-18.6-13.8-12.8->16.6 -> 21.0   Hospital day # 6   Patient continues to be stable from stroke standpoint.  Still has some left leg pain and ischemia appears to be getting worse and vascular surgery feels she may need surgery amputation later next week.  Continue aspirin 81 mg and may consider switching to Plavix later after amputation.  Continue management of lower extremity ischemia as per vascular surgery and CHF and respiratory issues as per medical hospitalist.  .  No family at the bedside.  Discussed with Dr. Algis Liming.  Greater than 50% time during this 35-minute visit was spent on counseling and coordination of care about recent stroke and lower extremity ischemia and answering questions      Antony Contras, MD Medical Director [place] Pager: [phone] 08/10/2022 11:39 AM   To contact Stroke Continuity  provider, please refer to [email or web address] After hours, contact General Neurology

## 2022-08-10 NOTE — Progress Notes (Addendum)
PROGRESS NOTE   Heather [name]  BMW:[id number]    DOB: [birth year]    DOA: 08/04/2022  PCP: [name], FNP   I have briefly reviewed patients previous medical records in [facility].   Brief Narrative:  71 year old female with PMH of DM with neuropathy, HTN, HLD, COPD, GERD, PAD, s/p TAVR, chronic diastolic CHF, anxiety/depression/schizophrenia, tobacco abuse, presented to ER with sudden onset of dizziness, blurred vision, headache, nausea and vomiting. BP in EMS 209/76.  She was admitted to ICU for hemorrhagic stroke and bilateral lower extremity acute on chronic ischemia.  Vascular surgery consulted and s/p bilateral lower extremity femoral endarterectomy and patch angioplasty with iliac stenting, 4 compartment fasciotomies for acute on chronic lower extremity ischemia.  Postop remained on vent and Cleviprex.  Transferred to floor and TRH on 12/17.  Neurology and vascular surgery following.   Assessment & Plan:  Principal Problem:   ICH (intracerebral hemorrhage) ([place])   Bilateral lower extremity acute on chronic ischemia: - s/p bilateral lower extremity femoral endarterectomy and patch angioplasty with iliac stenting, 4 compartment fasciotomiesPost op care per vasc surgery  - ck still elevated, 6462 on 12/16, will check again in AM. -Vascular surgery follow-up appreciated.  LLE with no palpable or dopplerable pulses, cool foot to touch with mottling of the toes, reportedly worse than before.   - Anticipating she will need AKA later this week. -IV heparin discontinued 12/16 and now on aspirin 81 Mg daily and Plavix 75 Mg daily.   Acute cerebellar vermis intraparenchymal hemorrhage  Management per neurology/stroke team. Code stroke CT: Acute intraparenchymal hematoma within the cerebellar vermis measuring 11 cc in volume and mild mass effect upon the fourth ventricle. CTA head and neck: Negative for large vessel occlusion.  70% stenosis of innominate artery origin. Follow-up CT  head 12/12: Stable intraparenchymal hematoma within the cerebellar vermis.  Stable moderate mass effect upon the fourth ventricle. MRI brain stable left cerebellar ICH with extension to fourth ventricle.  No hydrocephalus. Repeat CT head 12/14: Stable hematoma and no hydrocephalus. TTE: LVEF 60-65%, LDL 34, A1c 12.9. Was on aspirin 81 Mg daily + Plavix 75 Mg daily PTA, then here on IV heparin per stroke protocol which was switched to aspirin 81 Mg daily on 12/17. As per therapy recommendations, SNF when medically optimized.  Acute respiratory failure with hypoxia Secondary to acute cerebellar hemorrhagic stroke and postop Extubated 12/14. Resolved.  Hx of depression, bipolar, schizophrenia, insomnia. - trazodone at night    HTN emergency. Hx of CAD, chronic HFpEF, HLD, HTN, s/p TAVR. - goal SBP <160 -Antiplatelet management as above.  Cozaar being held due to AKI. - holding lipitor in setting of elevated CK  - Got a couple doses of IV Lasix in ICU. - continue coreg and prns available for sbp <160 BP controlled.   Hx of COPD with tobacco abuse. -triple therapy nebs  -No clinical exacerbation at this time.   AKI from rhabdomyolysis. Non-gap metabolic acidosis. -AKI and metabolic acidosis resolved.   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 DM neuropathy. -A1c 12.9 on on 08/05/2022 - hold metformin  -Uncontrolled CBGs with CBGs ranging in the 224-330 range in the last 24 hours. -It appears that patient Semglee was increased from 80 units to 12 units on 12/17.  Continue NovoLog SSI and NovoLog 5 units every 4 hours. -Adjust insulins as needed.   Anemia of critical illness. - f/u CBC - transfuse for hgb <7 -Hemoglobin has dropped from 8.5-7.9 in the absence of  overt bleeding.  Follow CBC in a.m. and transfuse if hemoglobin 7 g or less.  Dysphagia: Likely related to hemorrhagic stroke Has left nasal core track with ongoing tube feeds. Failed swallow evaluation on  12/16, will have SLP reassess on 12/18.  Body mass index is 27.25 kg/m.  Nutritional Status Nutrition Problem: Increased nutrient needs Etiology: wound healing Signs/Symptoms: estimated needs Interventions: Tube feeding, MVI  Pressure Ulcer: Pressure Injury 08/04/22 Sacrum Stage 2 -  Partial thickness loss of dermis presenting as a shallow open injury with a red, pink wound bed without slough. (Active)  08/04/22 0100  Location: Sacrum  Location Orientation:   Staging: Stage 2 -  Partial thickness loss of dermis presenting as a shallow open injury with a red, pink wound bed without slough.  Wound Description (Comments):   Present on Admission: Yes  Dressing Type Foam - Lift dressing to assess site every shift 08/09/22 2015     DVT prophylaxis:   IV heparin discontinued today.  Consider subcutaneous heparin or Lovenox postop.   Code Status: Full Code:  Family Communication: None at bedside. Disposition:  Status is: Inpatient Remains inpatient appropriate because: N.p.o., NG tube feeds, upcoming possible surgery     Consultants:   PCCM Vascular surgery Neurology  Procedures:   As indicated above  Antimicrobials:   None   Subjective:  Denies complaints.  No dyspnea or pain reported.  Objective:   Vitals:   08/10/22 0346 08/10/22 0805 08/10/22 0839 08/10/22 1157  BP:  (!) 132/57  (!) 133/56  Pulse:  (!) 106  95  Resp:  18  18  Temp:  99 F (37.2 C)  97.6 F (36.4 C)  TempSrc:  Oral  Oral  SpO2:  92% 96% 93%  Weight: 63.3 kg     Height:        General exam: Middle-age female, looks older than stated age, lying comfortably propped up in bed without distress.  Is on room air.  Left nasal cor track with feeding. Respiratory system: Clear to auscultation. Respiratory effort normal. Cardiovascular system: S1 & S2 heard, RRR. No JVD, murmurs, rubs, gallops or clicks. No pedal edema.  Telemetry personally reviewed: Sinus rhythm. Gastrointestinal system: Abdomen is  nondistended, soft and nontender. No organomegaly or masses felt. Normal bowel sounds heard. Central nervous system: Alert and oriented x 2. No focal neurological deficits. Extremities: Bilateral upper extremity grade 4 x 5 power.  Bilateral lower extremities?  2 x 5 power.  Bilateral shin dressings clean and dry.  Left leg cold with absent distal pulses. Skin: No rashes, lesions or ulcers Psychiatry: Judgement and insight appear normal. Mood & affect appropriate.     Data Reviewed:   I have personally reviewed following labs and imaging studies   CBC: Recent Labs  Lab 08/08/22 0509 08/09/22 0345 08/10/22 0325  WBC 16.6* 21.0* 18.2*  HGB 8.3* 8.5* 7.9*  HCT 25.0* 24.9* 24.5*  MCV 91.9 88.9 91.8  PLT 163 230 992    Basic Metabolic Panel: Recent Labs  Lab 08/05/22 0400 08/05/22 1848 08/08/22 0509 08/08/22 1810 08/09/22 0345 08/09/22 1554 08/10/22 0325  NA 140   < > 146* 143 146* 145 141  K 3.5   < > 3.9 3.3* 4.0 4.2 4.0  CL 107   < > 118* 109 111 110 105  CO2 20*   < > '23 26 29 29 30  '$ GLUCOSE 218*   < > 250* 347* 220* 212* 257*  BUN 38*   < >  36* 41* 47* 47* 47*  CREATININE 1.53*   < > 0.93 1.09* 1.17* 0.95 0.88  CALCIUM 8.1*   < > 7.6* 8.2* 8.3* 8.2* 8.3*  MG 2.3  --  2.7*  --   --   --   --    < > = values in this interval not displayed.    Liver Function Tests: Recent Labs  Lab 08/04/22 0322  AST 20  ALT 14  ALKPHOS 113  BILITOT 1.1  PROT 6.9  ALBUMIN 3.8    CBG: Recent Labs  Lab 08/10/22 0338 08/10/22 0805 08/10/22 1152  GLUCAP 222* 312* 330*    Microbiology Studies:   Recent Results (from the past 240 hour(s))  Resp panel by RT-PCR (RSV, Flu A&B, Covid) Anterior Nasal Swab     Status: None   Collection Time: 08/03/22 10:16 PM   Specimen: Anterior Nasal Swab  Result Value Ref Range Status   SARS Coronavirus 2 by RT PCR NEGATIVE NEGATIVE Final    Comment: (NOTE) SARS-CoV-2 target nucleic acids are NOT DETECTED.  The SARS-CoV-2 RNA is  generally detectable in upper respiratory specimens during the acute phase of infection. The lowest concentration of SARS-CoV-2 viral copies this assay can detect is 138 copies/mL. A negative result does not preclude SARS-Cov-2 infection and should not be used as the sole basis for treatment or other patient management decisions. A negative result may occur with  improper specimen collection/handling, submission of specimen other than nasopharyngeal swab, presence of viral mutation(s) within the areas targeted by this assay, and inadequate number of viral copies(<138 copies/mL). A negative result must be combined with clinical observations, patient history, and epidemiological information. The expected result is Negative.  Fact Sheet for Patients:  EntrepreneurPulse.com.au  Fact Sheet for Healthcare Providers:  IncredibleEmployment.be  This test is no t yet approved or cleared by the [place] FDA and  has been authorized for detection and/or diagnosis of SARS-CoV-2 by FDA under an Emergency Use Authorization (EUA). This EUA will remain  in effect (meaning this test can be used) for the duration of the COVID-19 declaration under Section 564(b)(1) of the Act, 21 U.S.C.section 360bbb-3(b)(1), unless the authorization is terminated  or revoked sooner.       Influenza A by PCR NEGATIVE NEGATIVE Final   Influenza B by PCR NEGATIVE NEGATIVE Final    Comment: (NOTE) The Xpert Xpress SARS-CoV-2/FLU/RSV plus assay is intended as an aid in the diagnosis of influenza from Nasopharyngeal swab specimens and should not be used as a sole basis for treatment. Nasal washings and aspirates are unacceptable for Xpert Xpress SARS-CoV-2/FLU/RSV testing.  Fact Sheet for Patients: EntrepreneurPulse.com.au  Fact Sheet for Healthcare Providers: IncredibleEmployment.be  This test is not yet approved or cleared by the [place] FDA and has been authorized for detection and/or diagnosis of SARS-CoV-2 by FDA under an Emergency Use Authorization (EUA). This EUA will remain in effect (meaning this test can be used) for the duration of the COVID-19 declaration under Section 564(b)(1) of the Act, 21 U.S.C. section 360bbb-3(b)(1), unless the authorization is terminated or revoked.     Resp Syncytial Virus by PCR NEGATIVE NEGATIVE Final    Comment: (NOTE) Fact Sheet for Patients: EntrepreneurPulse.com.au  Fact Sheet for Healthcare Providers: IncredibleEmployment.be  This test is not yet approved or cleared by the [place] FDA and has been authorized for detection and/or diagnosis of SARS-CoV-2 by FDA under an Emergency Use Authorization (EUA). This EUA will remain in effect (meaning this test  can be used) for the duration of the COVID-19 declaration under Section 564(b)(1) of the Act, 21 U.S.C. section 360bbb-3(b)(1), unless the authorization is terminated or revoked.  Performed at [facility], [address] [place], [place] [address]   Surgical PCR screen     Status: None   Collection Time: 08/04/22  4:17 AM   Specimen: Nasal Swab  Result Value Ref Range Status   MRSA, PCR NEGATIVE NEGATIVE Final   Staphylococcus aureus NEGATIVE NEGATIVE Final    Comment: (NOTE) The Xpert SA Assay (FDA approved for NASAL specimens in patients 75 years of age and older), is one component of a comprehensive surveillance program. It is not intended to diagnose infection nor to guide or monitor treatment. Performed at [facility], [place] [address], [place], [place] [address]     Radiology Studies:  No results found.  Scheduled Meds:    arformoterol  15 mcg Nebulization BID   aspirin EC  81 mg Oral Daily   budesonide (PULMICORT) nebulizer solution  0.5 mg Nebulization BID   carvedilol  12.5 mg Per Tube BID WC   Chlorhexidine Gluconate Cloth  6 each  Topical Q0600   clopidogrel  75 mg Oral Daily   docusate  100 mg Per Tube BID   escitalopram  10 mg Per Tube Daily   insulin aspart  0-20 Units Subcutaneous Q4H   insulin aspart  5 Units Subcutaneous Q4H   insulin glargine-yfgn  12 Units Subcutaneous Daily   multivitamin with minerals  1 tablet Per Tube Daily   nicotine  14 mg Transdermal Daily   mouth rinse  15 mL Mouth Rinse Q4H   pantoprazole (PROTONIX) IV  40 mg Intravenous Q24H   polyethylene glycol  17 g Per Tube Daily   revefenacin  175 mcg Nebulization Daily   senna-docusate  1 tablet Per Tube BID    Continuous Infusions:    feeding supplement (PIVOT 1.5 CAL) 1,000 mL (08/09/22 2235)   lactated ringers 75 mL/hr at 08/10/22 0650     LOS: 6 days     Vernell Leep, MD,  FACP, [facility], [facility], [facility], [place]     To contact the attending provider between 7A-7P or the covering provider during after hours 7P-7A, please log into the web site [email or web address] and access using universal [place] password for that web site. If you do not have the password, please call the hospital operator.  08/10/2022, 1:47 PM

## 2022-08-10 NOTE — Progress Notes (Addendum)
Progress Note    08/10/2022 8:25 AM 6 Days Post-Op  Subjective:  enjoying her ice chips, denies any pain    Vitals:   08/10/22 0334 08/10/22 0805  BP: (!) 136/56 (!) 132/57  Pulse: 88 (!) 106  Resp: 18 18  Temp: 98.9 F (37.2 C) 99 F (37.2 C)  SpO2: 92% 92%    Physical Exam: Lungs:  nonlabored Incisions:  Bilateral groin incisions intact with staples, mild bruising of R groin. BLE dressed with bandages and dry.  Extremities:  LLE without palpable pulses or doppler signals, mild mottling of toes. RLE with PT doppler signal   CBC    Component Value Date/Time   WBC 18.2 (H) 08/10/2022 0325   RBC 2.67 (L) 08/10/2022 0325   HGB 7.9 (L) 08/10/2022 0325   HGB 14.3 06/20/2012 2127   HCT 24.5 (L) 08/10/2022 0325   HCT 42.9 06/20/2012 2127   PLT 249 08/10/2022 0325   PLT 357 06/20/2012 2127   MCV 91.8 08/10/2022 0325   MCV 91 06/20/2012 2127   MCH 29.6 08/10/2022 0325   MCHC 32.2 08/10/2022 0325   RDW 16.5 (H) 08/10/2022 0325   RDW 13.6 06/20/2012 2127   LYMPHSABS 1.8 11/15/2020 0539   MONOABS 0.8 11/15/2020 0539   EOSABS 0.4 11/15/2020 0539   BASOSABS 0.0 11/15/2020 0539    BMET    Component Value Date/Time   NA 141 08/10/2022 0325   NA 135 (L) 06/20/2012 2127   K 4.0 08/10/2022 0325   K 4.0 06/20/2012 2127   CL 105 08/10/2022 0325   CL 100 06/20/2012 2127   CO2 30 08/10/2022 0325   CO2 27 06/20/2012 2127   GLUCOSE 257 (H) 08/10/2022 0325   GLUCOSE 250 (H) 06/20/2012 2127   BUN 47 (H) 08/10/2022 0325   BUN 20 (H) 06/20/2012 2127   CREATININE 0.88 08/10/2022 0325   CREATININE 0.60 06/20/2012 2127   CALCIUM 8.3 (L) 08/10/2022 0325   CALCIUM 8.8 06/20/2012 2127   GFRNONAA >60 08/10/2022 0325   GFRNONAA >60 06/20/2012 2127   GFRAA >60 06/20/2012 2127    INR    Component Value Date/Time   INR 1.0 08/03/2022 2156     Intake/Output Summary (Last 24 hours) at 08/10/2022 0825 Last data filed at 08/10/2022 0358 Gross per 24 hour  Intake 2634.47 ml   Output 2351 ml  Net 283.47 ml      Assessment/Plan:  71 y.o. female is 6 days post op,s/p: bilateral lower extremity femoral endarterectomy and patch angiplasty with iliac stenting, four compartment fasciotomies for acute on chronic lower extremity ischemia.     -Bilateral groin and lower extremity incisions intact. R groin with mild bruising. Lower extremities dressed with dry bandages -LLE with no palpable or dopplerable pulses. Cool foot to touch with mottling of the toes. Denies pain. Will likely need AKA but hopeful for more recovery prior to surgery -RLE well perfused with PT doppler signal. Intact motor and sensation -Heparin discontinued yesterday. Starting on ASA '81mg'$  daily.   Vicente Serene, PA-C [facility] [phone] 08/10/2022 8:25 AM   VASCULAR STAFF ADDENDUM: I have independently interviewed and examined the patient. I agree with the above.  Doing well overall. Continues to make slow, steady progress. WBC better today. No signs of severe sepsis. L foot appears a bit worse.  Anticipate she will need AKA later this week.   Yevonne Aline. Stanford Breed, MD [facility] Phone Number: [phone] 08/10/2022 10:23  AM    

## 2022-08-11 DIAGNOSIS — I614 Nontraumatic intracerebral hemorrhage in cerebellum: Secondary | ICD-10-CM | POA: Diagnosis not present

## 2022-08-11 DIAGNOSIS — I70222 Atherosclerosis of native arteries of extremities with rest pain, left leg: Secondary | ICD-10-CM | POA: Diagnosis not present

## 2022-08-11 LAB — CK: Total CK: 3982 U/L — ABNORMAL HIGH (ref 38–234)

## 2022-08-11 LAB — GLUCOSE, CAPILLARY
Glucose-Capillary: 227 mg/dL — ABNORMAL HIGH (ref 70–99)
Glucose-Capillary: 232 mg/dL — ABNORMAL HIGH (ref 70–99)
Glucose-Capillary: 234 mg/dL — ABNORMAL HIGH (ref 70–99)
Glucose-Capillary: 242 mg/dL — ABNORMAL HIGH (ref 70–99)
Glucose-Capillary: 247 mg/dL — ABNORMAL HIGH (ref 70–99)
Glucose-Capillary: 256 mg/dL — ABNORMAL HIGH (ref 70–99)
Glucose-Capillary: 292 mg/dL — ABNORMAL HIGH (ref 70–99)

## 2022-08-11 LAB — CBC
HCT: 22.9 % — ABNORMAL LOW (ref 36.0–46.0)
Hemoglobin: 7.6 g/dL — ABNORMAL LOW (ref 12.0–15.0)
MCH: 30.3 pg (ref 26.0–34.0)
MCHC: 33.2 g/dL (ref 30.0–36.0)
MCV: 91.2 fL (ref 80.0–100.0)
Platelets: 259 10*3/uL (ref 150–400)
RBC: 2.51 MIL/uL — ABNORMAL LOW (ref 3.87–5.11)
RDW: 16.1 % — ABNORMAL HIGH (ref 11.5–15.5)
WBC: 16.3 10*3/uL — ABNORMAL HIGH (ref 4.0–10.5)
nRBC: 0.9 % — ABNORMAL HIGH (ref 0.0–0.2)

## 2022-08-11 LAB — COMPREHENSIVE METABOLIC PANEL
ALT: 58 U/L — ABNORMAL HIGH (ref 0–44)
AST: 91 U/L — ABNORMAL HIGH (ref 15–41)
Albumin: 1.7 g/dL — ABNORMAL LOW (ref 3.5–5.0)
Alkaline Phosphatase: 113 U/L (ref 38–126)
Anion gap: 5 (ref 5–15)
BUN: 46 mg/dL — ABNORMAL HIGH (ref 8–23)
CO2: 29 mmol/L (ref 22–32)
Calcium: 8.2 mg/dL — ABNORMAL LOW (ref 8.9–10.3)
Chloride: 106 mmol/L (ref 98–111)
Creatinine, Ser: 0.78 mg/dL (ref 0.44–1.00)
GFR, Estimated: >60 mL/min (ref >60)
Glucose, Bld: 244 mg/dL — ABNORMAL HIGH (ref 70–99)
Potassium: 4.1 mmol/L (ref 3.5–5.1)
Sodium: 140 mmol/L (ref 135–145)
Total Bilirubin: 0.7 mg/dL (ref 0.3–1.2)
Total Protein: 4.8 g/dL — ABNORMAL LOW (ref 6.5–8.1)

## 2022-08-11 MED ORDER — INSULIN ASPART 100 UNIT/ML IJ SOLN
7.0000 [IU] | INTRAMUSCULAR | Status: DC
  Administered 2022-08-11 – 2022-08-16 (×20): 7 [IU] via SUBCUTANEOUS

## 2022-08-11 MED ORDER — HYDROMORPHONE HCL 1 MG/ML IJ SOLN
0.5000 mg | INTRAMUSCULAR | Status: DC | PRN
  Administered 2022-08-11 – 2022-09-03 (×55): 1 mg via INTRAVENOUS
  Administered 2022-09-09 – 2022-09-11 (×2): 0.5 mg via INTRAVENOUS
  Administered 2022-09-18 – 2022-09-19 (×2): 1 mg via INTRAVENOUS
  Filled 2022-08-11 (×62): qty 1
  Filled 2022-08-11 (×3): qty 0.5
  Filled 2022-08-11: qty 1

## 2022-08-11 MED ORDER — OXYCODONE HCL 5 MG PO TABS
5.0000 mg | ORAL_TABLET | ORAL | Status: DC | PRN
  Administered 2022-08-11 – 2022-09-20 (×88): 5 mg via ORAL
  Filled 2022-08-11 (×91): qty 1

## 2022-08-11 MED ORDER — INSULIN GLARGINE-YFGN 100 UNIT/ML ~~LOC~~ SOLN
18.0000 [IU] | Freq: Every day | SUBCUTANEOUS | Status: DC
  Administered 2022-08-12 – 2022-08-15 (×4): 18 [IU] via SUBCUTANEOUS
  Filled 2022-08-11 (×5): qty 0.18

## 2022-08-11 MED ORDER — INSULIN GLARGINE-YFGN 100 UNIT/ML ~~LOC~~ SOLN
6.0000 [IU] | Freq: Once | SUBCUTANEOUS | Status: DC
  Filled 2022-08-11: qty 0.06

## 2022-08-11 NOTE — Progress Notes (Addendum)
Progress Note    08/11/2022 7:15 AM 7 Days Post-Op  Subjective:  feeling pain in both of her legs    Vitals:   08/11/22 0004 08/11/22 0414  BP: (!) 130/51 (!) 123/49  Pulse: 98 95  Resp: 18 17  Temp: 99.2 F (37.3 C) 98.2 F (36.8 C)  SpO2: 93% 95%    Physical Exam: Lungs:  nonlabored Incisions:  bilateral groin incisions intact with staples. Bilateral leg fasciotomies intact with dry bandage Extremities:  LLE with no palpable or dopplerable pulses, L foot is pale and cool to touch. R foot with PT/DP doppler signal, warm to touch with intact motor and sensation   CBC    Component Value Date/Time   WBC 16.3 (H) 08/11/2022 0357   RBC 2.51 (L) 08/11/2022 0357   HGB 7.6 (L) 08/11/2022 0357   HGB 14.3 06/20/2012 2127   HCT 22.9 (L) 08/11/2022 0357   HCT 42.9 06/20/2012 2127   PLT 259 08/11/2022 0357   PLT 357 06/20/2012 2127   MCV 91.2 08/11/2022 0357   MCV 91 06/20/2012 2127   MCH 30.3 08/11/2022 0357   MCHC 33.2 08/11/2022 0357   RDW 16.1 (H) 08/11/2022 0357   RDW 13.6 06/20/2012 2127   LYMPHSABS 1.8 11/15/2020 0539   MONOABS 0.8 11/15/2020 0539   EOSABS 0.4 11/15/2020 0539   BASOSABS 0.0 11/15/2020 0539    BMET    Component Value Date/Time   NA 140 08/11/2022 0357   NA 135 (L) 06/20/2012 2127   K 4.1 08/11/2022 0357   K 4.0 06/20/2012 2127   CL 106 08/11/2022 0357   CL 100 06/20/2012 2127   CO2 29 08/11/2022 0357   CO2 27 06/20/2012 2127   GLUCOSE 244 (H) 08/11/2022 0357   GLUCOSE 250 (H) 06/20/2012 2127   BUN 46 (H) 08/11/2022 0357   BUN 20 (H) 06/20/2012 2127   CREATININE 0.78 08/11/2022 0357   CREATININE 0.60 06/20/2012 2127   CALCIUM 8.2 (L) 08/11/2022 0357   CALCIUM 8.8 06/20/2012 2127   GFRNONAA >60 08/11/2022 0357   GFRNONAA >60 06/20/2012 2127   GFRAA >60 06/20/2012 2127    INR    Component Value Date/Time   INR 1.0 08/03/2022 2156     Intake/Output Summary (Last 24 hours) at 08/11/2022 0715 Last data filed at 08/11/2022  4098 Gross per 24 hour  Intake 3446.12 ml  Output 2450 ml  Net 996.12 ml      Assessment/Plan:  71 y.o. female is 7 days post op,s/p: bilateral lower extremity femoral endarterectomy and patch angiplasty with iliac stenting, four compartment fasciotomies for acute on chronic lower extremity ischemia.    -Bilateral groin incisions intact with staples. Bilateral lower extremities dressed with dry bandages -LLE exam unchanged, no pulses or doppler signals at the foot or ankle. L foot is cool to touch -RLE well perfused, DP/PT doppler signals -On ASA and Plavix -WBC trending down, at 16.3 today. CK also trending down -Likely will need L AKA later this week   Vicente Serene, PA-C [facility] [phone] 08/11/2022 7:15 AM   I have independently interviewed and examined patient and agree with PA assessment and plan above.  Left foot much more mild today and certainly appears that above-knee amputation will not be avoidable on the left.  Right foot appears well-perfused and is sensory and weakly motor intact.  Continue aspirin therapy and we will further discuss amputation with the family and patient moving forward.  Cerrone Debold C. Donzetta Matters, MD [facility] [place] Office: [phone] Pager: [phone]

## 2022-08-11 NOTE — Progress Notes (Signed)
Briefly updated daughter [name], by patient's request.

## 2022-08-11 NOTE — Inpatient Diabetes Management (Signed)
Inpatient Diabetes Program Recommendations  AACE/ADA: New Consensus Statement on Inpatient Glycemic Control  Target Ranges:  Prepandial:   less than 140 mg/dL      Peak postprandial:   less than 180 mg/dL (1-2 hours)      Critically ill patients:  140 - 180 mg/dL    Latest Reference Range & Units 08/11/22 00:06 08/11/22 04:17 08/11/22 07:53  Glucose-Capillary 70 - 99 mg/dL 292 (H) 234 (H) 256 (H)    Latest Reference Range & Units 08/10/22 08:05 08/10/22 11:52 08/10/22 16:47 08/10/22 20:11  Glucose-Capillary 70 - 99 mg/dL 312 (H) 330 (H) 175 (H) 341 (H)   Review of Glycemic Control  Current orders for Inpatient glycemic control: Semglee 12 units daily, Novolog 5 units Q4H, Novolog 0-20 units Q4H; Pivot @ 55 ml/hr  Inpatient Diabetes Program Recommendations:    Insulin: Please consider increasing Semglee to 18 units daily and tube feeding coverage to Novolog 7 units Q4H.  Thanks, Barnie Alderman, RN, MSN, [place] Diabetes Coordinator Inpatient Diabetes Program [phone] (Team Pager from 8am to 5pm)'

## 2022-08-11 NOTE — Procedures (Signed)
Objective Swallowing Evaluation: Type of Study: FEES-Fiberoptic Endoscopic Evaluation of Swallow   Patient Details  Name: Heather [name] MRN: [id number] Date of Birth: 05-09-1951  Today's Date: 08/11/2022 Time: SLP Start Time (ACUTE ONLY): 1106 -SLP Stop Time (ACUTE ONLY): 1135  SLP Time Calculation (min) (ACUTE ONLY): 29 min   Past Medical History:  Past Medical History:  Diagnosis Date   (HFpEF) heart failure with preserved ejection fraction (HCC)    Angina pectoris (HCC)    Anxiety    Aortic atherosclerosis (HCC)    Basal cell carcinoma    Bilateral carotid artery disease ([place])    a.) carotid doppler 03/18/2021: 2-68% RICA, 34-19% LICA   Bipolar disorder (HCC)    Cervicalgia    Chronic mesenteric ischemia ([place])    a.) s/p PTA 11/13/2020 --> 6 x 16 mm lifestream ballon expanding stent to SMA   COPD (chronic obstructive pulmonary disease) ([place])    Coronary artery disease 03/20/2014   a.) LHC/PCI 03/20/2014: 70% mLAD (2.5 x 28 mm Xience Alpine DES), 70% dLCx, 70% dRCA; b.) LHC 01/25/2016: 90% ISR mLAD --> mLAD dissection with in stent --> 2.75 x 22 mm Resolute DES; c.) R/LHC 09/29/2016: 50% ISR mLAD, 70% dLAD, mPA 37, PCWP 24, AO sat 90%, PA sat 46%, CO 3.7, CI 2.46 - med mgmt   DDD (degenerative disc disease), cervical    Depression    Diabetic polyneuropathy (HCC)    GERD (gastroesophageal reflux disease)    Hyperlipidemia    Hypertension    Long term current use of antithrombotics/antiplatelets    a.) on DAPT (ASA + clopidogrel)   Memory loss    NSTEMI (non-ST elevated myocardial infarction) ([place]) 07/2016   a.) troponins trended (normal < 0.034 ng/mL): 0.325 --> 1.480 --> 2.330 --> 1.660 --> 0.169 ng/mL   PAD (peripheral artery disease) ([place])    a.) s/p PTA and stenting SMA 11/13/2020; b.) s/p PTA and stenting of RIGHT SFA and popliteal 04/12/2021; c.) s/p PTA and kissing balloon stents to BILATERAL CIAs 05/28/2021   Respiratory failure with hypoxia (HCC)    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   a.) 23 mm Sapien 3 via suprasternal approach   Schizophrenia ([place])    Severe aortic stenosis    a.) s/p TAVR 09/30/2016; 23 mm Sapien 3 via a suprasternal approach   Type 2 diabetes mellitus treated with insulin (HCC)    Vertigo    Past Surgical History:  Past Surgical History:  Procedure Laterality Date   AORTIC VALVE REPLACEMENT  09/30/2016   Procedure: TRANSCATHETER AORTIC VALVE REPAIR   BLADDER SURGERY     CATARACT EXTRACTION, BILATERAL     CHOLECYSTECTOMY     COLONOSCOPY     CORONARY ANGIOPLASTY WITH STENT PLACEMENT  03/20/2014   Procedure: CORONARY ANGIOPLASTY WITH STENT PLACEMENT; Location: [facility]; Surgeon: Jacques Earthly, MD   CORONARY ANGIOPLASTY WITH STENT PLACEMENT Left 01/25/2016   Procedure: CORONARY ANGIOPLASTY WITH STENT PLACEMENT; Location: [facility]; Surgeon: Jacques Earthly, MD   ENDARTERECTOMY FEMORAL Bilateral 08/04/2022   Procedure: BILATERAL FEMORAL ENDARTERECTOMY;  Surgeon: Waynetta Sandy, MD;  Location: [facility];  Service: Vascular;  Laterality: Bilateral;   FASCIECTOMY Bilateral 08/04/2022   Procedure: BILATERAL LOWER EXTREMITY FASCIECTOMIES;  Surgeon: Waynetta Sandy, MD;  Location: [place] [facility];  Service: Vascular;  Laterality: Bilateral;   FOOT SURGERY Right    HEMORRHOID SURGERY     INSERTION OF ILIAC STENT Bilateral 08/04/2022   Procedure: INSERTION OF BILATERAL ILIAC STENTS;  Surgeon: Waynetta Sandy,  MD;  Location: [place];  Service: Vascular;  Laterality: Bilateral;   IRRIGATION AND DEBRIDEMENT HEMATOMA Left 07/08/2014   GROIN   LOWER EXTREMITY ANGIOGRAM Bilateral 08/04/2022   Procedure: LOWER EXTREMITY ANGIOGRAM;  Surgeon: Waynetta Sandy, MD;  Location: [place];  Service: Vascular;  Laterality: Bilateral;   LOWER EXTREMITY ANGIOGRAPHY Right 04/12/2021   Procedure: Lower Extremity Angiography;  Surgeon: [name], MD;  Location: [facility];  Service:  Cardiovascular;  Laterality: Right;   LOWER EXTREMITY ANGIOGRAPHY Left 05/28/2021   Procedure: LOWER EXTREMITY ANGIOGRAPHY;  Surgeon: [name], MD;  Location: [facility];  Service: Cardiovascular;  Laterality: Left;   LOWER EXTREMITY ANGIOGRAPHY Left 03/25/2022   Procedure: Lower Extremity Angiography;  Surgeon: [name], MD;  Location: [facility];  Service: Cardiovascular;  Laterality: Left;   PATCH ANGIOPLASTY  08/04/2022   Procedure: LEFT FEMORAL PATCH ANGIOPLASTY USING XENOSURE BIOLOGIC PATCH;  Surgeon: Waynetta Sandy, MD;  Location: [facility];  Service: Vascular;;   PSEUDOANEURYSM REPAIR Left 06/30/2014   FEMORAL ARTERY   RIGHT/LEFT HEART CATH AND CORONARY ANGIOGRAPHY Bilateral 09/29/2016   Procedure: RIGHT/LEFT HEART CATH AND CORONARY ANGIOGRAPHY; Location [facility]; Surgeon: Jacques Earthly, MD   THROMBECTOMY FEMORAL ARTERY Bilateral 08/04/2022   Procedure: BILATERAL LOWER EXTREMITY THROMBECTOMY;  Surgeon: Waynetta Sandy, MD;  Location: [place];  Service: Vascular;  Laterality: Bilateral;   TUBAL LIGATION     VISCERAL ANGIOGRAPHY N/A 11/13/2020   Procedure: VISCERAL ANGIOGRAPHY;  Surgeon: [name], MD;  Location: [facility];  Service: Cardiovascular;  Laterality: N/A;   HPI: 71 yo female smoker presented to ER with sudden onset of dizziness, blurred vision, headache, nausea and vomiting.  BP in EMS 209/76.  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  Also found to have bilateral leg pain with numbness and coolness.  Found to have occlusion of b/l common iliac artery stents, Lt SFA and popliteal stent and vascular surgery consulted.  Taken to OR for b/l common femoral endarterectomy, iliofemoral endarterectomy, lower extremity embolectomy and stent of b/l common iliac arteries and Rt external iliac artery, and b/l 4 compartment fasciotomies.  Remained on vent and cleviprex post op. 12/11-12/14.    Subjective: Pt upright in bed eating ice chips; anxious to have food.    Recommendations for follow up therapy are one component of a multi-disciplinary discharge planning process, led by the attending physician.  Recommendations may be updated based on patient status, additional functional criteria and insurance authorization.  Assessment / Plan / Recommendation     08/11/2022   12:01 PM  Clinical Impressions  Clinical Impression Pt presents with a mild oropharyngeal dysphagia c/b prolonged oral phase, reduced base of tongue retraction, delayed swallow intiation, incomplete laryngea closure, and diminished sensation.  These deficits resulted in static penetration of thin liquid dairy reaching the level of the vocal folds.  There was intermittent penetration of thin liquid prior to the swallow. With water penetration fully cleared or did not enter the laryngeal vestibule.  With thin milk there was static unsensed penetration which progressed to the anterior commissure of the the vocal folds. It was likely aspirated during subsequent swallows but could not be visualized in trachea.  There was no penetration or aspiration of puree or solid textures, but pt exhibited significantly prolonged oral phase and incomplete mastication of solids, likely 2/2 at least in part to edentulism. With regular and soft solids there was significant vallecular  residue, lingual pumping noted, and pt required multiple swallow to clear pharyngeal residue.  With simulated ground consistency oral and pharyngeal clearance was much effective and efficient.    Recommend chopped/ground diet (dysphagia 2) with thin liquid with MILK PRECAUTIONS: no milk to drink, no ice cream, milkshakes, milk-like supplements, etc.    SLP Visit Diagnosis Dysphagia, oropharyngeal phase (R13.12)  Impact on safety and function Mild aspiration risk         08/11/2022   12:01 PM  Treatment Recommendations  Treatment Recommendations Therapy as  outlined in treatment plan below        08/11/2022   12:13 PM  Prognosis  Prognosis for Safe Diet Advancement Good       08/11/2022   12:01 PM  Diet Recommendations  SLP Diet Recommendations Dysphagia 2 (Fine chop) solids;Thin liquid  Liquid Administration via Cup;Straw  Medication Administration --  Compensations Slow rate;Small sips/bites  Postural Changes Seated upright at 90 degrees         08/11/2022   12:01 PM  Other Recommendations  Oral Care Recommendations Oral care BID  Follow Up Recommendations Skilled nursing-short term rehab (<3 hours/day)  Functional Status Assessment Patient has had a recent decline in their functional status and demonstrates the ability to make significant improvements in function in a reasonable and predictable amount of time.       08/11/2022   12:01 PM  Frequency and Duration   Speech Therapy Frequency (ACUTE ONLY) min 2x/week  Treatment Duration 2 weeks         08/11/2022   11:59 AM  Oral Phase  Oral Phase Impaired  Oral - Thin Straw Premature spillage  Oral - Puree WFL  Oral - Mech Soft Piecemeal swallowing;Decreased bolus cohesion;Delayed oral transit;Impaired mastication  Oral - Regular Impaired mastication;Piecemeal swallowing;Delayed oral transit;Decreased bolus cohesion       08/11/2022   12:00 PM  Pharyngeal Phase  Pharyngeal Phase Impaired  Pharyngeal- Thin Straw Reduced airway/laryngeal closure;Penetration/Aspiration before swallow  Pharyngeal Material enters airway, CONTACTS cords and not ejected out;Material enters airway, remains ABOVE vocal cords then ejected out  Pharyngeal- Puree WFL  Pharyngeal Material does not enter airway  Pharyngeal- Mechanical Soft Reduced tongue base retraction;Pharyngeal residue - valleculae  Pharyngeal Material does not enter airway  Pharyngeal- Regular Reduced tongue base retraction  Pharyngeal Material does not enter airway         No data to display           Celedonio Savage, MA, [place] Office: [phone] 08/11/2022, 12:13 PM

## 2022-08-11 NOTE — Progress Notes (Signed)
PROGRESS NOTE   Heather [name]  OAC:[id number]    DOB: [birth year]    DOA: 08/04/2022  PCP: [name], FNP   I have briefly reviewed patients previous medical records in [facility].   Brief Narrative:  71 year old female with PMH of DM with neuropathy, HTN, HLD, COPD, GERD, PAD, s/p TAVR, chronic diastolic CHF, anxiety/depression/schizophrenia, tobacco abuse, presented to ER with sudden onset of dizziness, blurred vision, headache, nausea and vomiting. BP in EMS 209/76.  She was admitted to ICU for hemorrhagic stroke and bilateral lower extremity acute on chronic ischemia.  Vascular surgery consulted and s/p bilateral lower extremity femoral endarterectomy and patch angioplasty with iliac stenting, 4 compartment fasciotomies for acute on chronic lower extremity ischemia.  Postop remained on vent and Cleviprex.  Transferred to floor and TRH on 12/17.  Neurology and vascular surgery following.   Assessment & Plan:  Principal Problem:   ICH (intracerebral hemorrhage) ([place])   Bilateral lower extremity acute on chronic ischemia: - s/p bilateral lower extremity femoral endarterectomy and patch angioplasty with iliac stenting, 4 compartment fasciotomiesPost op care per vasc surgery  - ck still elevated, 6462 on 12/16, will check again in AM. -Vascular surgery follow-up appreciated.  LLE with no palpable or dopplerable pulses, cool foot to touch with mottling of the toes, reportedly worse than before.   - Anticipating she will need AKA later this week. -IV heparin discontinued 12/16 and now on aspirin 81 Mg daily and Plavix 75 Mg daily.   Acute cerebellar vermis intraparenchymal hemorrhage  Management per neurology/stroke team. Code stroke CT: Acute intraparenchymal hematoma within the cerebellar vermis measuring 11 cc in volume and mild mass effect upon the fourth ventricle. CTA head and neck: Negative for large vessel occlusion.  70% stenosis of innominate artery origin. Follow-up CT  head 12/12: Stable intraparenchymal hematoma within the cerebellar vermis.  Stable moderate mass effect upon the fourth ventricle. MRI brain stable left cerebellar ICH with extension to fourth ventricle.  No hydrocephalus. Repeat CT head 12/14: Stable hematoma and no hydrocephalus. TTE: LVEF 60-65%, LDL 34, A1c 12.9. Was on aspirin 81 Mg daily + Plavix 75 Mg daily PTA, then here on IV heparin per stroke protocol which was switched to aspirin 81 Mg daily on 12/17.  Remains on Plavix 75 Mg daily as well. As per therapy recommendations, SNF when medically optimized. As per Dr. Leonie Man, neurology will sign off 12/18.  Acute respiratory failure with hypoxia Secondary to acute cerebellar hemorrhagic stroke and postop Extubated 12/14. Resolved.  Was saturating in the 90s yesterday so unclear why patient is back on 2 L/min [place] oxygen.  Wean as tolerated.  Hx of depression, bipolar, schizophrenia, insomnia. - trazodone at night    HTN emergency. Hx of CAD, chronic HFpEF, HLD, HTN, s/p TAVR. - goal SBP <160 -Antiplatelet management as above.  Cozaar being held due to AKI. - holding lipitor in setting of elevated CK  - Got a couple doses of IV Lasix in ICU. - continue coreg and prns available for sbp <160 BP controlled. CK down from 6462 > 3982.   Hx of COPD with tobacco abuse. -triple therapy nebs  -No clinical exacerbation at this time.   AKI from rhabdomyolysis. Non-gap metabolic acidosis. -AKI and metabolic acidosis resolved.   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 DM neuropathy. -A1c 12.9 on on 08/05/2022 - hold metformin  -Uncontrolled CBGs with CBGs ranging in the 234-292 range since this morning. -DM coordinator input appreciated.  As per recommendations, increase  Semglee from 12 to 18 units daily, tube feeding coverage to 7 units every 4 hours and continue NovoLog SSI. -Adjust insulins as needed.   Anemia of critical illness. - f/u CBC - transfuse for hgb  <7 -Hemoglobin has dropped from 8.5-7.9 >7.6 in the absence of overt bleeding.  Follow CBC in a.m. and transfuse if hemoglobin 7 g or less.  Dysphagia: Likely related to hemorrhagic stroke Has left nasal core track with ongoing tube feeds. Failed swallow evaluation on 12/16. As per SLP follow-up, completed FEES and recommended dysphagia to 2 diet with thin liquids on 12/18.  Body mass index is 28.93 kg/m.  Nutritional Status Nutrition Problem: Increased nutrient needs Etiology: wound healing Signs/Symptoms: estimated needs Interventions: Tube feeding, MVI  Pressure Ulcer: Pressure Injury 08/04/22 Sacrum Stage 2 -  Partial thickness loss of dermis presenting as a shallow open injury with a red, pink wound bed without slough. (Active)  08/04/22 0100  Location: Sacrum  Location Orientation:   Staging: Stage 2 -  Partial thickness loss of dermis presenting as a shallow open injury with a red, pink wound bed without slough.  Wound Description (Comments):   Present on Admission: Yes  Dressing Type Foam - Lift dressing to assess site every shift 08/11/22 0736     DVT prophylaxis: heparin injection 5,000 Units Start: 08/10/22 1600  IV heparin discontinued today.  Consider subcutaneous heparin or Lovenox postop.   Code Status: Full Code:  Family Communication: None at bedside. Disposition:  Status is: Inpatient Remains inpatient appropriate because: N.p.o., NG tube feeds, upcoming possible surgery     Consultants:   PCCM Vascular surgery Neurology  Procedures:   As indicated above  Antimicrobials:   None   Subjective:  "No food"!.  Asking for ice chips.  No other complaints.  Objective:   Vitals:   08/11/22 0750 08/11/22 0800 08/11/22 1100 08/11/22 1155  BP:  (!) 135/54  (!) 101/48  Pulse:  93 82 85  Resp:  (!) '25 17 19  '$ Temp:  98.6 F (37 C)  98.8 F (37.1 C)  TempSrc:  Oral  Oral  SpO2: 95% 100% 96% 98%  Weight:      Height:        General exam:  Middle-age female, looks older than stated age, lying comfortably propped up in bed without distress.  Left nasal cor track with feeding. Respiratory system: Clear to auscultation.  No increased work of breathing. Cardiovascular system: S1 & S2 heard, RRR. No JVD, murmurs, rubs, gallops or clicks. No pedal edema.  Telemetry personally reviewed: Sinus rhythm. Gastrointestinal system: Abdomen is nondistended, soft and nontender. No organomegaly or masses felt. Normal bowel sounds heard. Central nervous system: Alert and oriented x 2. No focal neurological deficits. Extremities: Bilateral upper extremity grade 4 x 5 power.  Bilateral lower extremities?  2 x 5 power.  Bilateral shin dressings clean and dry.  Left leg cold with absent distal pulses.  Unchanged compared to yesterday.  Left upper extremity with extensive bruising, near the cubital fossa, suspect due to IV access recently, may have a small subcutaneous hematoma. Skin: No rashes, lesions or ulcers Psychiatry: Judgement and insight appear normal. Mood & affect appropriate.     Data Reviewed:   I have personally reviewed following labs and imaging studies   CBC: Recent Labs  Lab 08/09/22 0345 08/10/22 0325 08/11/22 0357  WBC 21.0* 18.2* 16.3*  HGB 8.5* 7.9* 7.6*  HCT 24.9* 24.5* 22.9*  MCV 88.9 91.8 91.2  PLT  230 249 373    Basic Metabolic Panel: Recent Labs  Lab 08/05/22 0400 08/05/22 1848 08/08/22 0509 08/08/22 1810 08/09/22 0345 08/09/22 1554 08/10/22 0325 08/11/22 0357  NA 140   < > 146* 143 146* 145 141 140  K 3.5   < > 3.9 3.3* 4.0 4.2 4.0 4.1  CL 107   < > 118* 109 111 110 105 106  CO2 20*   < > '23 26 29 29 30 29  '$ GLUCOSE 218*   < > 250* 347* 220* 212* 257* 244*  BUN 38*   < > 36* 41* 47* 47* 47* 46*  CREATININE 1.53*   < > 0.93 1.09* 1.17* 0.95 0.88 0.78  CALCIUM 8.1*   < > 7.6* 8.2* 8.3* 8.2* 8.3* 8.2*  MG 2.3  --  2.7*  --   --   --   --   --    < > = values in this interval not displayed.    Liver  Function Tests: Recent Labs  Lab 08/11/22 0357  AST 91*  ALT 58*  ALKPHOS 113  BILITOT 0.7  PROT 4.8*  ALBUMIN 1.7*    CBG: Recent Labs  Lab 08/11/22 0417 08/11/22 0753 08/11/22 1152  GLUCAP 234* 256* 247*    Microbiology Studies:   Recent Results (from the past 240 hour(s))  Resp panel by RT-PCR (RSV, Flu A&B, Covid) Anterior Nasal Swab     Status: None   Collection Time: 08/03/22 10:16 PM   Specimen: Anterior Nasal Swab  Result Value Ref Range Status   SARS Coronavirus 2 by RT PCR NEGATIVE NEGATIVE Final    Comment: (NOTE) SARS-CoV-2 target nucleic acids are NOT DETECTED.  The SARS-CoV-2 RNA is generally detectable in upper respiratory specimens during the acute phase of infection. The lowest concentration of SARS-CoV-2 viral copies this assay can detect is 138 copies/mL. A negative result does not preclude SARS-Cov-2 infection and should not be used as the sole basis for treatment or other patient management decisions. A negative result may occur with  improper specimen collection/handling, submission of specimen other than nasopharyngeal swab, presence of viral mutation(s) within the areas targeted by this assay, and inadequate number of viral copies(<138 copies/mL). A negative result must be combined with clinical observations, patient history, and epidemiological information. The expected result is Negative.  Fact Sheet for Patients:  EntrepreneurPulse.com.au  Fact Sheet for Healthcare Providers:  IncredibleEmployment.be  This test is no t yet approved or cleared by the [place] FDA and  has been authorized for detection and/or diagnosis of SARS-CoV-2 by FDA under an Emergency Use Authorization (EUA). This EUA will remain  in effect (meaning this test can be used) for the duration of the COVID-19 declaration under Section 564(b)(1) of the Act, 21 U.S.C.section 360bbb-3(b)(1), unless the authorization is terminated   or revoked sooner.       Influenza A by PCR NEGATIVE NEGATIVE Final   Influenza B by PCR NEGATIVE NEGATIVE Final    Comment: (NOTE) The Xpert Xpress SARS-CoV-2/FLU/RSV plus assay is intended as an aid in the diagnosis of influenza from Nasopharyngeal swab specimens and should not be used as a sole basis for treatment. Nasal washings and aspirates are unacceptable for Xpert Xpress SARS-CoV-2/FLU/RSV testing.  Fact Sheet for Patients: EntrepreneurPulse.com.au  Fact Sheet for Healthcare Providers: IncredibleEmployment.be  This test is not yet approved or cleared by the [place] FDA and has been authorized for detection and/or diagnosis of SARS-CoV-2 by FDA under an Emergency Use Authorization (  EUA). This EUA will remain in effect (meaning this test can be used) for the duration of the COVID-19 declaration under Section 564(b)(1) of the Act, 21 U.S.C. section 360bbb-3(b)(1), unless the authorization is terminated or revoked.     Resp Syncytial Virus by PCR NEGATIVE NEGATIVE Final    Comment: (NOTE) Fact Sheet for Patients: EntrepreneurPulse.com.au  Fact Sheet for Healthcare Providers: IncredibleEmployment.be  This test is not yet approved or cleared by the [place] FDA and has been authorized for detection and/or diagnosis of SARS-CoV-2 by FDA under an Emergency Use Authorization (EUA). This EUA will remain in effect (meaning this test can be used) for the duration of the COVID-19 declaration under Section 564(b)(1) of the Act, 21 U.S.C. section 360bbb-3(b)(1), unless the authorization is terminated or revoked.  Performed at [facility], [address] [place], [place] [address]   Surgical PCR screen     Status: None   Collection Time: 08/04/22  4:17 AM   Specimen: Nasal Swab  Result Value Ref Range Status   MRSA, PCR NEGATIVE NEGATIVE Final   Staphylococcus aureus NEGATIVE  NEGATIVE Final    Comment: (NOTE) The Xpert SA Assay (FDA approved for NASAL specimens in patients 70 years of age and older), is one component of a comprehensive surveillance program. It is not intended to diagnose infection nor to guide or monitor treatment. Performed at [facility], [place] [address], [place], [place] [address]     Radiology Studies:  No results found.  Scheduled Meds:    arformoterol  15 mcg Nebulization BID   aspirin EC  81 mg Oral Daily   budesonide (PULMICORT) nebulizer solution  0.5 mg Nebulization BID   carvedilol  12.5 mg Per Tube BID WC   Chlorhexidine Gluconate Cloth  6 each Topical Q0600   clopidogrel  75 mg Oral Daily   docusate  100 mg Per Tube BID   escitalopram  10 mg Per Tube Daily   heparin injection (subcutaneous)  5,000 Units Subcutaneous Q8H   insulin aspart  0-20 Units Subcutaneous Q4H   insulin aspart  7 Units Subcutaneous Q4H   [START ON 08/12/2022] insulin glargine-yfgn  18 Units Subcutaneous Daily   insulin glargine-yfgn  6 Units Subcutaneous Once   multivitamin with minerals  1 tablet Per Tube Daily   nicotine  14 mg Transdermal Daily   mouth rinse  15 mL Mouth Rinse Q4H   pantoprazole (PROTONIX) IV  40 mg Intravenous Q24H   polyethylene glycol  17 g Per Tube Daily   revefenacin  175 mcg Nebulization Daily   senna-docusate  1 tablet Per Tube BID    Continuous Infusions:    feeding supplement (PIVOT 1.5 CAL) 55 mL/hr at 08/11/22 1000   lactated ringers 75 mL/hr at 08/11/22 1000     LOS: 7 days     Vernell Leep, MD,  FACP, [facility], [facility], [facility], [facility]   [facility] [place]     To contact the attending provider between 7A-7P or the covering provider during after hours 7P-7A, please log into the web site [email or web address] and access using universal [place] password for that web site. If you do not have the password, please call the hospital operator.  08/11/2022, 2:24 PM

## 2022-08-11 NOTE — Care Management Important Message (Signed)
Important Message  Patient Details  Name: Heather [name] MRN: [id number] Date of Birth: 1951/02/18   Medicare Important Message Given:  Yes     Shelda Altes 08/11/2022, 12:38 PM

## 2022-08-11 NOTE — Progress Notes (Signed)
STROKE TEAM PROGRESS NOTE   INTERVAL HISTORY No family at the bedside this morning.  She states that she feels pain in left leg.-LLE with no palpable or dopplerable pulses Cool foot to touch with mottling of the toes   She will likely still need an AKA amputation later this week as per Dr. Donzetta Matters   Neurological exam unchanged  Vitals:   08/11/22 0750 08/11/22 0800 08/11/22 1100 08/11/22 1155  BP:  (!) 135/54  (!) 101/48  Pulse:  93 82 85  Resp:  (!) '25 17 19  '$ Temp:  98.6 F (37 C)  98.8 F (37.1 C)  TempSrc:  Oral  Oral  SpO2: 95% 100% 96% 98%  Weight:      Height:       CBC:  Recent Labs  Lab 08/10/22 0325 08/11/22 0357  WBC 18.2* 16.3*  HGB 7.9* 7.6*  HCT 24.5* 22.9*  MCV 91.8 91.2  PLT 249 683   Basic Metabolic Panel:  Recent Labs  Lab 08/05/22 0400 08/05/22 1848 08/08/22 0509 08/08/22 1810 08/10/22 0325 08/11/22 0357  NA 140   < > 146*   < > 141 140  K 3.5   < > 3.9   < > 4.0 4.1  CL 107   < > 118*   < > 105 106  CO2 20*   < > 23   < > 30 29  GLUCOSE 218*   < > 250*   < > 257* 244*  BUN 38*   < > 36*   < > 47* 46*  CREATININE 1.53*   < > 0.93   < > 0.88 0.78  CALCIUM 8.1*   < > 7.6*   < > 8.3* 8.2*  MG 2.3  --  2.7*  --   --   --    < > = values in this interval not displayed.   Lipid Panel:  Recent Labs  Lab 08/05/22 0400  CHOL 96  TRIG 531*  535*  HDL 25*  CHOLHDL 3.8  VLDL UNABLE TO CALCULATE IF TRIGLYCERIDE OVER 400 mg/dL  LDLCALC UNABLE TO CALCULATE IF TRIGLYCERIDE OVER 400 mg/dL   HgbA1c:  Recent Labs  Lab 08/05/22 0400  HGBA1C 12.9*   Urine Drug Screen:  Recent Labs  Lab 08/04/22 1730  LABOPIA POSITIVE*  COCAINSCRNUR NONE DETECTED  LABBENZ POSITIVE*  AMPHETMU NONE DETECTED  THCU NONE DETECTED  LABBARB NONE DETECTED    Alcohol Level  No results for input(s): "ETH" in the last 168 hours.   IMAGING past 24 hours No results found.  PHYSICAL EXAM  Physical Exam   Temp:  [97.8 F (36.6 C)-99.2 F (37.3 C)] 98.8 F (37.1  C) (12/18 1155) Pulse Rate:  [82-98] 85 (12/18 1155) Resp:  [17-25] 19 (12/18 1155) BP: (101-154)/(48-95) 101/48 (12/18 1155) SpO2:  [93 %-100 %] 98 % (12/18 1155) Weight:  [67.2 kg] 67.2 kg (12/18 0414)  General - Well nourished, well developed, pleasant middle-age Caucasian lady.  Ophthalmologic - fundi not visualized due to noncooperation. Lower extremity absent distal pulses in the left leg.  Mottling of the skin over the toes on the left foot.  Left calf and foot is tender to touch Cardiovascular - Regular rate and rhythm.  Neuro -awake, alert, and oriented to self, place, year.  She is able to tell me that she is at the hospital because she had a stroke. Able to follow commands Eyes in mid position, blinking to visual threat bilaterally, tracking bilaterally, PERRL.  Facial symmetric.  Tongue protrusion midline.  B/l UE 3/5 but symmetrical. B/l LE wiggles toes more on the right than the left. Sensation-identifies sensation correctly in bilateral upper and lower extremities Coordination-no ataxia noted with gross movement gait not tested.    ASSESSMENT/PLAN [name] is a 71 y.o. female with history of  smoker(2ppd), severe AI/AS s/p TAVR, CAD s/p PCI and on DAPT, COPD, PAD, HTN who presented to [facility] ED with sudden onset headache, dizziness while sitting on the toilet having a bowel movement and pushing down. She fell back and hit her back but not her head. 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 She was given DDAVP. She was hypertensive to 725D systolic and was started on Nicardipine gtt. She endorses pain from her thighs down her legs. Her lower extremities are cold to touch and appear a little mottled. On arrival to [facility] ED, vascular surgery consulted and she was emergently taken to  the OR. Returned to 4N intubated.   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 70% stenosis of innominate artery origin, bilateral VA origin stenosis Follow up Head CT 12/12 - Stable intraparenchymal hematoma within the cerebellar vermis. Stable moderate mass effect upon the fourth ventricle. MRI brain w wo stable left cerebellar ICH with extension to 4th ventricle. No hydrocephalus Repeat CT 12/14 stable hematoma and no hydro 2D Echo EF 60 to 65% LDL 34 TG 531 HgbA1c 12.9 VTE prophylaxis - heparin IV aspirin 81 mg daily and clopidogrel 75 mg daily prior to admission, now on heparin IV per stroke protocol -> will switch to ASA '81mg'$   Therapy recommendations: Skilled nursing facility Disposition:  pending   PAD status post stenting Bilateral lower extremity [place] 2B acute limb ischemia vascular surgery on board S/p b/l common femoral endarterectomy, b/l iliofemoral embolectomy and stent of b/l common iliac arteries, right external iliac artery and left external iliac artery, b/l 4 compartment fasciotomies Left leg cold and painful on palpitation, likely still need amputation eventually Now on heparin IV at stroke goal 0.3-0.5  Acute respiratory failure Extubated 12/14 CCM on board  Off sedation Status post Lasix Mild tachypnea  Hypertension -> hypotension -> hypertension Home meds:  metoprolol succinate, losartan BP goal < 160 Was on IV Cleviprex - now off Developed low BP 12/12, s/p 1L bolus NS and lasix overnight Now BP stable on the high end On Coreg 12.5 twice daily On Lasix Long term BP goal normotensive  Hyperlipidemia Home  meds:  atorvastatin '80mg'$ , resumed in hospital LDL 34, goal < 70 TG 531 Statin on hold due to elevated CK levels Continue statin at discharge if CK normalizes  Diabetes type II Uncontrolled Home meds:  metformin, insulin, levemir  HgbA1c 12.0, goal < 7.0 Hyperglycemia  On insulin subcu CBGs q4 SSI  Severe anemia Hb 11.6-9.5-7.8-5.6-PRBC-8.4->8.3 Send post PRBC transfusion No overt bleeding source Continue heparin IV for now May hold off heparin if further bleeding  AKI Rhabdomyolysis Decreased UOP->improved Cre  0.6-1.53-1.89-1.89-1.44-0.93 CK 11,322->15,865 -> 15,580 -> 6462 Status post Lasix CCM on board  Dysphagia  Cortrak placed  TF @ 55 On LR @ 75  Tobacco abuse Current smoker Smoking cessation counseling will be provided Nicotine patch provided  Other Stroke Risk Factors Advanced Age >/= 13  Coronary artery disease status post PCI  Other Active Problems COPD Status post TAVR Leukocytosis WBC 13.6-18.6-13.8-12.8->16.6 -> 21.0   Hospital day # 7   Patient continues to be stable from stroke standpoint.  Still has some left leg pain and ischemia appears to be getting worse and vascular surgery feels she may need surgery amputation later this week.  Continue aspirin 81 mg and may consider switching to Plavix later after amputation.  Continue management of lower extremity ischemia as per vascular surgery and CHF and respiratory issues as per medical hospitalist.  .  No family at the bedside.  Discussed with Dr. Algis Liming.  Stroke team will sign off.  Kindly call for questions        Antony Contras, MD Medical Director [place] Pager: [phone] 08/11/2022 4:24 PM   To contact Stroke Continuity provider, please refer to [email or web address] After hours, contact General Neurology

## 2022-08-11 NOTE — Progress Notes (Signed)
Speech Language Pathology Treatment: Dysphagia  Patient Details Name: Heather [name] MRN: [id number] DOB: [birth year] Today's Date: 08/11/2022 Time: 3299-2426 SLP Time Calculation (min) (ACUTE ONLY): 10 min  Assessment / Plan / Recommendation Clinical Impression  Pt presents with clinically improved swallow function.  Pt is eager for POs.  She seems more relaxed and comfortable overall.  With PO trial today there was no change to respirations. Pt did not exhibit any overt s/s of aspiration with PO intake; however, a concern for prandial aspiration still exists.  Pt is now appropriate for instrumental assessment.  Discussed options with pt who is agreeable to FEES.   Will proceed with FEES for objective swallow study.   HPI HPI: 71 yo female smoker presented to ER with sudden onset of dizziness, blurred vision, headache, nausea and vomiting.  BP in EMS 209/76.  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  Also found to have bilateral leg pain with numbness and coolness.  Found to have occlusion of b/l common iliac artery stents, Lt SFA and popliteal stent and vascular surgery consulted.  Taken to OR for b/l common femoral endarterectomy, iliofemoral endarterectomy, lower extremity embolectomy and stent of b/l common iliac arteries and Rt external iliac artery, and b/l 4 compartment fasciotomies.  Remained on vent and cleviprex post op. 12/11-12/14.      SLP Plan   (FEES)      Recommendations for follow up therapy are one component of a multi-disciplinary discharge planning process, led by the attending physician.  Recommendations may be updated based on patient status, additional functional criteria and insurance authorization.    Recommendations  Diet recommendations: NPO                Oral Care Recommendations: Oral care QID Follow Up Recommendations: Skilled nursing-short term rehab (<3 hours/day) SLP Visit Diagnosis: Dysphagia, oropharyngeal phase  (R13.12) Plan:  (FEES)           Celedonio Savage, [place], [place] Office: [phone] 08/11/2022, 11:44 AM

## 2022-08-12 DIAGNOSIS — I70222 Atherosclerosis of native arteries of extremities with rest pain, left leg: Secondary | ICD-10-CM | POA: Diagnosis not present

## 2022-08-12 DIAGNOSIS — I614 Nontraumatic intracerebral hemorrhage in cerebellum: Secondary | ICD-10-CM | POA: Diagnosis not present

## 2022-08-12 LAB — CBC
HCT: 23.6 % — ABNORMAL LOW (ref 36.0–46.0)
Hemoglobin: 7.7 g/dL — ABNORMAL LOW (ref 12.0–15.0)
MCH: 30.7 pg (ref 26.0–34.0)
MCHC: 32.6 g/dL (ref 30.0–36.0)
MCV: 94 fL (ref 80.0–100.0)
Platelets: 314 10*3/uL (ref 150–400)
RBC: 2.51 MIL/uL — ABNORMAL LOW (ref 3.87–5.11)
RDW: 15.9 % — ABNORMAL HIGH (ref 11.5–15.5)
WBC: 19.6 10*3/uL — ABNORMAL HIGH (ref 4.0–10.5)
nRBC: 0.6 % — ABNORMAL HIGH (ref 0.0–0.2)

## 2022-08-12 LAB — COMPREHENSIVE METABOLIC PANEL
ALT: 55 U/L — ABNORMAL HIGH (ref 0–44)
AST: 74 U/L — ABNORMAL HIGH (ref 15–41)
Albumin: 1.8 g/dL — ABNORMAL LOW (ref 3.5–5.0)
Alkaline Phosphatase: 116 U/L (ref 38–126)
Anion gap: 6 (ref 5–15)
BUN: 43 mg/dL — ABNORMAL HIGH (ref 8–23)
CO2: 26 mmol/L (ref 22–32)
Calcium: 8.1 mg/dL — ABNORMAL LOW (ref 8.9–10.3)
Chloride: 104 mmol/L (ref 98–111)
Creatinine, Ser: 0.74 mg/dL (ref 0.44–1.00)
GFR, Estimated: >60 mL/min (ref >60)
Glucose, Bld: 277 mg/dL — ABNORMAL HIGH (ref 70–99)
Potassium: 4.3 mmol/L (ref 3.5–5.1)
Sodium: 136 mmol/L (ref 135–145)
Total Bilirubin: 0.6 mg/dL (ref 0.3–1.2)
Total Protein: 5 g/dL — ABNORMAL LOW (ref 6.5–8.1)

## 2022-08-12 LAB — GLUCOSE, CAPILLARY
Glucose-Capillary: 239 mg/dL — ABNORMAL HIGH (ref 70–99)
Glucose-Capillary: 212 mg/dL — ABNORMAL HIGH (ref 70–99)
Glucose-Capillary: 275 mg/dL — ABNORMAL HIGH (ref 70–99)
Glucose-Capillary: 192 mg/dL — ABNORMAL HIGH (ref 70–99)
Glucose-Capillary: 117 mg/dL — ABNORMAL HIGH (ref 70–99)
Glucose-Capillary: 271 mg/dL — ABNORMAL HIGH (ref 70–99)

## 2022-08-12 LAB — CK: Total CK: 3379 U/L — ABNORMAL HIGH (ref 38–234)

## 2022-08-12 MED ORDER — PIVOT 1.5 CAL PO LIQD
1000.0000 mL | ORAL | Status: DC
  Administered 2022-08-12 – 2022-08-19 (×10): 1000 mL
  Filled 2022-08-12 (×10): qty 1000

## 2022-08-12 NOTE — Progress Notes (Addendum)
PROGRESS NOTE   Heather [name]  VZC:[id number]    DOB: [birth year]    DOA: 08/04/2022  PCP: [name], FNP   I have briefly reviewed patients previous medical records in [facility].   Brief Narrative:  71 year old female with PMH of DM with neuropathy, HTN, HLD, COPD, GERD, PAD, s/p TAVR, chronic diastolic CHF, anxiety/depression/schizophrenia, tobacco abuse, presented to ER with sudden onset of dizziness, blurred vision, headache, nausea and vomiting. BP in EMS 209/76.  She was admitted to ICU for hemorrhagic stroke and bilateral lower extremity acute on chronic ischemia.  Vascular surgery consulted and s/p bilateral lower extremity femoral endarterectomy and patch angioplasty with iliac stenting, 4 compartment fasciotomies for acute on chronic lower extremity ischemia.  Postop remained on vent and Cleviprex.  Transferred to floor and TRH on 12/17.  Neurology and vascular surgery following.  Vascular surgery plans left lower extremity amputation on 12/21.   Assessment & Plan:  Principal Problem:   ICH (intracerebral hemorrhage) ([place])   Bilateral lower extremity acute on chronic ischemia: - s/p bilateral lower extremity femoral endarterectomy and patch angioplasty with iliac stenting, 4 compartment fasciotomiesPost op care per vasc surgery  - ck decreasing, 6462 on 12/16, down to 3379 on 12/19. -Vascular surgery follow-up appreciated.  LLE with no palpable or dopplerable pulses, cool foot to touch with mottling of the toes, reportedly worse than before.   - Anticipating she will get an AKA on 12/21 as discussed today with Dr. Donzetta Matters, VVS. -IV heparin discontinued 12/16 and now on aspirin 81 Mg daily and Plavix 75 Mg daily.  See discussion below regarding antiplatelets. -Discussed with Dr. Leonie Man, neurology on 12/19.  From a stroke perspective, patient only needs a single antiplatelet agent.  If vascular surgery wishes to continue Plavix then he recommends discontinuing aspirin.   Communicated with Dr. Donzetta Matters, [place] to continue Plavix. ASA DC'ed.   Acute cerebellar vermis intraparenchymal hemorrhage  Management per neurology/stroke team. Code stroke CT: Acute intraparenchymal hematoma within the cerebellar vermis measuring 11 cc in volume and mild mass effect upon the fourth ventricle. CTA head and neck: Negative for large vessel occlusion.  70% stenosis of innominate artery origin. Follow-up CT head 12/12: Stable intraparenchymal hematoma within the cerebellar vermis.  Stable moderate mass effect upon the fourth ventricle. MRI brain stable left cerebellar ICH with extension to fourth ventricle.  No hydrocephalus. Repeat CT head 12/14: Stable hematoma and no hydrocephalus. TTE: LVEF 60-65%, LDL 34, A1c 12.9. Was on aspirin 81 Mg daily + Plavix 75 Mg daily PTA, then here on IV heparin per stroke protocol which was switched to aspirin 81 Mg daily on 12/17.  Remains on Plavix 75 Mg daily as well. As per therapy recommendations, SNF when medically optimized. Neurology signed off 12/18.  Please see above regarding discussion with Dr. Leonie Man on 12/19 regarding antiplatelets.  Acute respiratory failure with hypoxia Secondary to acute cerebellar hemorrhagic stroke and postop Extubated 12/14. Has been weaned off to room air.  Hx of depression, bipolar, schizophrenia, insomnia. - trazodone at night    HTN emergency. Hx of CAD, chronic HFpEF, HLD, HTN, s/p TAVR. - goal SBP <160 -Antiplatelet management as above.  Cozaar being held due to AKI. - holding lipitor in setting of elevated CK  - Got a couple doses of IV Lasix in ICU. - continue coreg and prns available for sbp <160 BP controlled. CK down from 6462 > 3982 >3379.   Hx of COPD with tobacco abuse. -triple therapy nebs  -No clinical  exacerbation at this time.   AKI from rhabdomyolysis. Non-gap metabolic acidosis. -AKI and metabolic acidosis resolved.   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 DM  neuropathy. -A1c 12.9 on on 08/05/2022 - hold metformin  -Uncontrolled CBGs with CBGs ranging in 212-275.  Currently on Semglee 18 units daily, tube feed coverage 7 units every 4 hours and NovoLog resistant SSI. -Patient now has a diet order and is tolerating by mouth but not eating much.  As communicated extensively with registered dietitian, since not eating much, can continue oral intake during the day and can change to tube feeds only at night to provide her with a nutritional needs.  Hold tube feed insulins when tube feeds are not ongoing.   Anemia of critical illness. - f/u CBC - transfuse for hgb <7 -Hemoglobin has dropped from 8.5-7.9 >7.6>7.7 in the absence of overt bleeding.  Follow CBC in a.m. and transfuse if hemoglobin 7 g or less.  Dysphagia: Likely related to hemorrhagic stroke Has left nasal core track with ongoing tube feeds. Failed swallow evaluation on 12/16. As per SLP follow-up, completed FEES and recommended dysphagia to 2 diet with thin liquids on 12/18. As per RN, tolerating dysphagia 2 diet and thin liquids but not eating much.  See discussion above re tube feeds  Body mass index is 29.28 kg/m.  Nutritional Status Nutrition Problem: Increased nutrient needs Etiology: wound healing Signs/Symptoms: estimated needs Interventions: Tube feeding, MVI  Pressure Ulcer: Pressure Injury 08/04/22 Sacrum Stage 2 -  Partial thickness loss of dermis presenting as a shallow open injury with a red, pink wound bed without slough. (Active)  08/04/22 0100  Location: Sacrum  Location Orientation:   Staging: Stage 2 -  Partial thickness loss of dermis presenting as a shallow open injury with a red, pink wound bed without slough.  Wound Description (Comments):   Present on Admission: Yes  Dressing Type Foam - Lift dressing to assess site every shift 08/12/22 0800     DVT prophylaxis: heparin injection 5,000 Units Start: 08/10/22 1600  IV heparin discontinued today.  Consider  subcutaneous heparin or Lovenox postop.   Code Status: Full Code:  Family Communication: Daughter via phone. Disposition:  Status is: Inpatient Remains inpatient appropriate because: N.p.o., NG tube feeds, upcoming possible surgery on 12/21     Consultants:   PCCM Vascular surgery Neurology  Procedures:   As indicated above  Antimicrobials:   None   Subjective:  Denied complaints.  No pain reported.  Objective:   Vitals:   08/12/22 0430 08/12/22 0700 08/12/22 0808 08/12/22 1219  BP:  (!) 121/51  (!) 143/52  Pulse:  99 99 85  Resp:  '16 14 20  '$ Temp:  98.2 F (36.8 C)  98.2 F (36.8 C)  TempSrc:  Oral  Oral  SpO2:  93%  97%  Weight: 68 kg     Height:        General exam: Middle-age female, looks older than stated age, lying comfortably propped up in bed without distress.  Left nasal cor track with feeding. Respiratory system: Clear to auscultation.  No increased work of breathing. Cardiovascular system: S1 & S2 heard, RRR. No JVD, murmurs, rubs, gallops or clicks. No pedal edema.  Telemetry personally reviewed: Sinus rhythm. Gastrointestinal system: Abdomen is nondistended, soft and nontender. No organomegaly or masses felt. Normal bowel sounds heard. Central nervous system: Alert and oriented x 2. No focal neurological deficits. Extremities: Bilateral upper extremity grade 4 x 5 power.  Bilateral lower  extremities?  2 x 5 power.  Bilateral shin dressings clean and dry.  Left leg cool with absent distal pulses.  Unchanged compared to yesterday.  Left upper extremity with extensive bruising, near the cubital fossa, suspect due to IV access recently, may have a small subcutaneous hematoma. Skin: No rashes, lesions or ulcers Psychiatry: Judgement and insight appear normal. Mood & affect appropriate.     Data Reviewed:   I have personally reviewed following labs and imaging studies   CBC: Recent Labs  Lab 08/10/22 0325 08/11/22 0357 08/12/22 0420  WBC 18.2* 16.3*  19.6*  HGB 7.9* 7.6* 7.7*  HCT 24.5* 22.9* 23.6*  MCV 91.8 91.2 94.0  PLT 249 259 326    Basic Metabolic Panel: Recent Labs  Lab 08/08/22 0509 08/08/22 1810 08/09/22 0345 08/09/22 1554 08/10/22 0325 08/11/22 0357 08/12/22 0420  NA 146*   < > 146* 145 141 140 136  K 3.9   < > 4.0 4.2 4.0 4.1 4.3  CL 118*   < > 111 110 105 106 104  CO2 23   < > '29 29 30 29 26  '$ GLUCOSE 250*   < > 220* 212* 257* 244* 277*  BUN 36*   < > 47* 47* 47* 46* 43*  CREATININE 0.93   < > 1.17* 0.95 0.88 0.78 0.74  CALCIUM 7.6*   < > 8.3* 8.2* 8.3* 8.2* 8.1*  MG 2.7*  --   --   --   --   --   --    < > = values in this interval not displayed.    Liver Function Tests: Recent Labs  Lab 08/11/22 0357 08/12/22 0420  AST 91* 74*  ALT 58* 55*  ALKPHOS 113 116  BILITOT 0.7 0.6  PROT 4.8* 5.0*  ALBUMIN 1.7* 1.8*    CBG: Recent Labs  Lab 08/12/22 0342 08/12/22 0735 08/12/22 1223  GLUCAP 239* 212* 275*    Microbiology Studies:   Recent Results (from the past 240 hour(s))  Resp panel by RT-PCR (RSV, Flu A&B, Covid) Anterior Nasal Swab     Status: None   Collection Time: 08/03/22 10:16 PM   Specimen: Anterior Nasal Swab  Result Value Ref Range Status   SARS Coronavirus 2 by RT PCR NEGATIVE NEGATIVE Final    Comment: (NOTE) SARS-CoV-2 target nucleic acids are NOT DETECTED.  The SARS-CoV-2 RNA is generally detectable in upper respiratory specimens during the acute phase of infection. The lowest concentration of SARS-CoV-2 viral copies this assay can detect is 138 copies/mL. A negative result does not preclude SARS-Cov-2 infection and should not be used as the sole basis for treatment or other patient management decisions. A negative result may occur with  improper specimen collection/handling, submission of specimen other than nasopharyngeal swab, presence of viral mutation(s) within the areas targeted by this assay, and inadequate number of viral copies(<138 copies/mL). A negative result  must be combined with clinical observations, patient history, and epidemiological information. The expected result is Negative.  Fact Sheet for Patients:  EntrepreneurPulse.com.au  Fact Sheet for Healthcare Providers:  IncredibleEmployment.be  This test is no t yet approved or cleared by the [place] FDA and  has been authorized for detection and/or diagnosis of SARS-CoV-2 by FDA under an Emergency Use Authorization (EUA). This EUA will remain  in effect (meaning this test can be used) for the duration of the COVID-19 declaration under Section 564(b)(1) of the Act, 21 U.S.C.section 360bbb-3(b)(1), unless the authorization is terminated  or revoked  sooner.       Influenza A by PCR NEGATIVE NEGATIVE Final   Influenza B by PCR NEGATIVE NEGATIVE Final    Comment: (NOTE) The Xpert Xpress SARS-CoV-2/FLU/RSV plus assay is intended as an aid in the diagnosis of influenza from Nasopharyngeal swab specimens and should not be used as a sole basis for treatment. Nasal washings and aspirates are unacceptable for Xpert Xpress SARS-CoV-2/FLU/RSV testing.  Fact Sheet for Patients: EntrepreneurPulse.com.au  Fact Sheet for Healthcare Providers: IncredibleEmployment.be  This test is not yet approved or cleared by the [place] FDA and has been authorized for detection and/or diagnosis of SARS-CoV-2 by FDA under an Emergency Use Authorization (EUA). This EUA will remain in effect (meaning this test can be used) for the duration of the COVID-19 declaration under Section 564(b)(1) of the Act, 21 U.S.C. section 360bbb-3(b)(1), unless the authorization is terminated or revoked.     Resp Syncytial Virus by PCR NEGATIVE NEGATIVE Final    Comment: (NOTE) Fact Sheet for Patients: EntrepreneurPulse.com.au  Fact Sheet for Healthcare Providers: IncredibleEmployment.be  This test is  not yet approved or cleared by the [place] FDA and has been authorized for detection and/or diagnosis of SARS-CoV-2 by FDA under an Emergency Use Authorization (EUA). This EUA will remain in effect (meaning this test can be used) for the duration of the COVID-19 declaration under Section 564(b)(1) of the Act, 21 U.S.C. section 360bbb-3(b)(1), unless the authorization is terminated or revoked.  Performed at [facility], [address] [place], [place] [address]   Surgical PCR screen     Status: None   Collection Time: 08/04/22  4:17 AM   Specimen: Nasal Swab  Result Value Ref Range Status   MRSA, PCR NEGATIVE NEGATIVE Final   Staphylococcus aureus NEGATIVE NEGATIVE Final    Comment: (NOTE) The Xpert SA Assay (FDA approved for NASAL specimens in patients 35 years of age and older), is one component of a comprehensive surveillance program. It is not intended to diagnose infection nor to guide or monitor treatment. Performed at [facility], [place] [address] [place], [place] [address]     Radiology Studies:  No results found.  Scheduled Meds:    arformoterol  15 mcg Nebulization BID   aspirin EC  81 mg Oral Daily   budesonide (PULMICORT) nebulizer solution  0.5 mg Nebulization BID   carvedilol  12.5 mg Per Tube BID WC   Chlorhexidine Gluconate Cloth  6 each Topical Q0600   clopidogrel  75 mg Oral Daily   docusate  100 mg Per Tube BID   escitalopram  10 mg Per Tube Daily   heparin injection (subcutaneous)  5,000 Units Subcutaneous Q8H   insulin aspart  0-20 Units Subcutaneous Q4H   insulin aspart  7 Units Subcutaneous Q4H   insulin glargine-yfgn  18 Units Subcutaneous Daily   insulin glargine-yfgn  6 Units Subcutaneous Once   multivitamin with minerals  1 tablet Per Tube Daily   nicotine  14 mg Transdermal Daily   mouth rinse  15 mL Mouth Rinse Q4H   pantoprazole (PROTONIX) IV  40 mg Intravenous Q24H   polyethylene glycol  17 g Per Tube Daily    revefenacin  175 mcg Nebulization Daily   senna-docusate  1 tablet Per Tube BID    Continuous Infusions:    feeding supplement (PIVOT 1.5 CAL) 1,000 mL (08/12/22 0618)   lactated ringers 75 mL/hr at 08/12/22 1319     LOS: 8 days  Vernell Leep, MD,  FACP, [place], [facility], [facility], [facility]   [facility] Physician Advisor [place]     To contact the attending provider between 7A-7P or the covering provider during after hours 7P-7A, please log into the web site [email or web address] and access using universal [place] password for that web site. If you do not have the password, please call the hospital operator.  08/12/2022, 3:23 PM

## 2022-08-12 NOTE — Progress Notes (Signed)
Mobility Specialist Progress Note:   08/12/22 1220  Mobility  Activity Transferred from chair to bed  Level of Assistance Moderate assist, patient does 50-74% (+2)  Assistive Device Stedy  Activity Response Tolerated fair  $Mobility charge 1 Mobility   RN and NT needing help getting pt back to bed. ModA+2 to stand in stedy.Left in bed with call bell in reach and all needs met.   Gareth Eagle Detrell Umscheid Mobility Specialist Please contact via [employer] or  Rehab Office at [phone]

## 2022-08-12 NOTE — Progress Notes (Signed)
Physical Therapy Treatment Patient Details Name: Heather [name] MRN: [id number] DOB: [birth year] Today's Date: 08/12/2022   History of Present Illness [name] is a 71 y.o. 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  Found to have mottled LE's after given Narcdipine due to hypertention and went for emergent common femoral and iliofemoral endarterectomy and bilateral 4 compartment fasciotomies on 08/04/22.    PT Comments    Pt seen for PT tx with co-tx with OT. Pt agreeable to mobility & initiates moving BLE to EOB but ultimately requires +2 for supine>sit. Pt tolerates sitting EOB but endorses feeling lightheaded (BP noted below). PT cues pt for BLE strengthening exercises but pt with decreased sustained attention to task & limited by BLE pain (L worse than R). Pt does tolerate bed>drop arm recliner via lateral scoot with total assist +2; PT facilitates head/hips relationship & pt requires total assist +2 for scooting. Pt does endorse pain with BLE in dependent position & during transitional movements (flexion<>extension) but seems to be in less pain with BLE elevated at end of session. Continue to recommend rehab upon d/c.  BP: Sitting EOB: 146/60 mmHg MAP 85 (pt c/o symptoms)  Initially sitting in recliner: 127/56 mmHg MAP 77 (pt still c/o symptoms) Sitting in recliner at end of session: 136/51 mmHg MAP 77 (pt reports feeling better)    Recommendations for follow up therapy are one component of a multi-disciplinary discharge planning process, led by the attending physician.  Recommendations may be updated based on patient status, additional functional criteria and insurance authorization.  Follow Up Recommendations  Skilled nursing-short term rehab (<3  hours/day) Can patient physically be transported by private vehicle: No   Assistance Recommended at Discharge Frequent or constant Supervision/Assistance  Patient can return home with the following Two people to help with walking and/or transfers;Two people to help with bathing/dressing/bathroom   Equipment Recommendations   (TBD in next venue)    Recommendations for Other Services       Precautions / Restrictions Precautions Precautions: Fall Precaution Comments: pain L LE>R with movement Restrictions Weight Bearing Restrictions: No     Mobility  Bed Mobility Overal bed mobility: Needs Assistance Bed Mobility: Supine to Sit     Supine to sit: Max assist, +2 for physical assistance (Pt does initiate moving BLE toward EOB. Decreased ability to reach & pull with UE on bed rail.)          Transfers Overall transfer level: Needs assistance Equipment used: None Transfers: Bed to chair/wheelchair/BSC            Lateral/Scoot Transfers: +2 physical assistance, Total assist General transfer comment: PT attempts to facilitate head/hips relationship as pt with poor ability to follow cuing/commands for anterior weight shifting. Pt requires total assist to scoot buttocks, requires multiple scoots to complete transfer bed>drop arm recliner.    Ambulation/Gait                   Stairs             Wheelchair Mobility    Modified Rankin (Stroke Patients Only)       Balance Overall balance assessment: Needs assistance Sitting-balance support: Feet supported, Bilateral upper extremity supported Sitting balance-Leahy Scale: Poor Sitting balance - Comments:  mod to min assist for sitting balance due to difficulty with trunk control                                    Cognition Arousal/Alertness: Awake/alert Behavior During Therapy: Anxious Overall Cognitive Status: Impaired/Different from baseline Area of Impairment: Attention, Memory,  Following commands, Safety/judgement, Awareness, Problem solving, Orientation                 Orientation Level: Situation   Memory: Decreased short-term memory Following Commands: Follows one step commands with increased time, Follows one step commands inconsistently Safety/Judgement: Decreased awareness of safety, Decreased awareness of deficits Awareness: Emergent Problem Solving: Requires verbal cues, Requires tactile cues, Slow processing, Decreased initiation, Difficulty sequencing          Exercises General Exercises - Lower Extremity Long Arc Quad: AROM, Strengthening, Both (5 reps RLE, 3 reps LLE) Other Exercises Other Exercises: Pt attempts to perform gentle PROM to BLE ankles but pt with limited tolerance to increased ROM.    General Comments        Pertinent Vitals/Pain Pain Assessment Pain Assessment: 0-10 Pain Score: 9  Pain Location: BLE (L>R) Pain Descriptors / Indicators: Aching, Discomfort, Grimacing, Guarding Pain Intervention(s): Monitored during session, Repositioned, Limited activity within patient's tolerance    Home Living                          Prior Function            PT Goals (current goals can now be found in the care plan section) Acute Rehab PT Goals Patient Stated Goal: agreeable to rehab PT Goal Formulation: With patient Time For Goal Achievement: 08/21/22 Potential to Achieve Goals: Fair Progress towards PT goals: Progressing toward goals    Frequency    Min 3X/week      PT Plan Current plan remains appropriate    Co-evaluation PT/OT/SLP Co-Evaluation/Treatment: Yes Reason for Co-Treatment: Complexity of the patient's impairments (multi-system involvement);For patient/therapist safety;Necessary to address cognition/behavior during functional activity;Other (comment) (2/2 significant pain with movement) PT goals addressed during session: Mobility/safety with mobility;Balance;Strengthening/ROM OT goals  addressed during session: ADL's and self-care      AM-PAC PT "6 Clicks" Mobility   Outcome Measure  Help needed turning from your back to your side while in a flat bed without using bedrails?: Total Help needed moving from lying on your back to sitting on the side of a flat bed without using bedrails?: Total Help needed moving to and from a bed to a chair (including a wheelchair)?: Total Help needed standing up from a chair using your arms (e.g., wheelchair or bedside chair)?: Total Help needed to walk in hospital room?: Total Help needed climbing 3-5 steps with a railing? : Total 6 Click Score: 6    End of Session Equipment Utilized During Treatment: Oxygen Activity Tolerance: Patient limited by fatigue;Patient limited by pain Patient left: in chair;with chair alarm set;with call bell/phone within reach Nurse Communication: Mobility status PT Visit Diagnosis: Muscle weakness (generalized) (M62.81);Pain;Other abnormalities of gait and mobility (R26.89) Pain - Right/Left:  (bilateral) Pain - part of body: Leg     Time: 0902-0925 PT Time Calculation (min) (ACUTE ONLY): 23 min  Charges:  $Therapeutic Activity: 8-22 mins                     Lavone Nian, PT, DPT 08/12/22,  12:59 PM   Waunita Schooner 08/12/2022, 12:56 PM

## 2022-08-12 NOTE — Progress Notes (Signed)
Nutrition Follow-up  DOCUMENTATION CODES:   Not applicable  INTERVENTION:  Will change to nocturnal tube feeds via Cortrak: -Provide Pivot 1.5 at 60 mL/hour x 12 hours overnight from 1800-0600 (720 mL) -Provides: 1080 kcal, 68 grams of protein, 540 mL H2O daily -Meets 64% minimum estimated kcal needs and 68% minimum protein needs  Continue providing free water flush of 30 mL every 4 hours while tube feeds infusing to maintain patency.  Continue multivitamin with minerals daily per tube. This can be changed to oral once pt has improved oral intake.  Provide Magic cup TID with meals, each supplement provides 290 kcal and 9 grams of protein. This supplement does not melt to a thin liquid.  NUTRITION DIAGNOSIS:   Increased nutrient needs related to wound healing as evidenced by estimated needs.  Ongoing.  GOAL:   Patient will meet greater than or equal to 90% of their needs  Previously met with goal continuous TF regimen; oral intake progressing with diet advancement  MONITOR:   PO intake, Supplement acceptance, Diet advancement, Labs, Weight trends, TF tolerance, Skin, I & O's  REASON FOR ASSESSMENT:   Consult Enteral/tube feeding initiation and management  ASSESSMENT:   Pt with PMH of CHF, anxiety, PAD, bipolar, COPD, CAD, depression, DM with neuropathy, GERD, HTN, HLD, schizophrenia admitted with acute ICH and critical limb ischemia.  12/11: s/p b/l common femoral endarterectomy, iliofemoral endarterectomy, lower extremity embolectomy and stent of b/l common iliac arteries and Rt external iliac artery, and b/l 4 compartment fasciotomies  S/p cortrak placement; per xray tip in distal stomach or proximal duodenum  12/14: extubated 12/16: transferred from 4N to 4E 12/18: underwent FEES; advanced to dysphagia 2 diet with thin liquids per SLP, however there is a comment on the diet order for milk precautions: no milk to drink, no ice cream, milkshakes, milk-like supplements  as pt had penetration with thin milk on study completed  Per review of chart plan for possible left AKA later this week.  Met with patient at bedside. She reports her appetite is poor. Pt reports she has not had anything to eat today except a bite of applesauce and sips of water. There were previously no documentation regarding meal intake at time of RD assessment, but now it is documented pt ate 10% of breakfast and lunch. Per pt she reported only having the bite of applesauce. Pt would benefit from addition of oral nutrition supplement, but concerned with documentation regarding penetration of thin milk on FEES study completed yesterday. At this time will order Magic Cup to come on trays as it does not melt to a thin liquid.  Pt was documented to be 63.5 kg on 08/08/22. Currently 68 kg. Suspect may be related to transfer to new unit and use of different scales vs fluid status. Will continue to monitor weight trends.  Enteral Access: 10 Fr. Cortrak tube placed 12/11; 58 cm in left nare secured with bridle; tip in distal stomach or proximal duodenum per abdominal x-ray 12/11  TF regimen: Pivot 1.5 at 55 mL/hour Free water: 30 mL water flush every 4 hours Provides: 1980 kcal, 124 grams of protein, 1170 mL H2O daily (990 mL water from formula + 180 mL from water flushes)  UOP: 1974 mL UOP (1.2 mL/kg/hr)  I/O: +8797.4 mL since admission  Medications reviewed and include: Colace, Novolog 0-20 units every 4 hours, Novolog 7 units every 4 hours, Semglee 18 units daily, MVI daily, pantoprazole, Miralax, senna-docusate, LR at 75 mL/hour  Labs reviewed:  CBG 192-239, BUN 43  Received secure chat from MD regarding plan to discontinue tube feeds. After discussing with MD and RN, plan is to change to nocturnal tube feeds and continue to monitor PO intake. Patient has had poor PO intake so far since advancing diet and would benefit from continued nutrition support at this time.  Diet Order:   Diet Order              DIET DYS 2 Room service appropriate? No; Fluid consistency: Thin  Diet effective now                  EDUCATION NEEDS:   No education needs have been identified at this time  Skin:  Skin Assessment: Skin Integrity Issues: Skin Integrity Issues:: Stage II, Incisions Stage II: sacrum Incisions: closed incisions to groin and leg  Last BM:  08/12/22 - small type 6  Height:   Ht Readings from Last 1 Encounters:  08/04/22 5' (1.524 m)   Weight:   Wt Readings from Last 1 Encounters:  08/12/22 68 kg   BMI:  Body mass index is 29.28 kg/m.  Estimated Nutritional Needs:   Kcal:  1700-1900  Protein:  100-125 grams  Fluid:  >1.7 L/day  [name], MS, RD, LDN, CNSC Pager number available on Am[employer]

## 2022-08-12 NOTE — Progress Notes (Signed)
Occupational Therapy Treatment Patient Details Name: Heather [name] MRN: [id number] DOB: [birth year] Today's Date: 08/12/2022   History of present illness [name] is a 71 y.o. 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  Found to have mottled LE's after given Narcdipine due to hypertention and went for emergent common femoral and iliofemoral endarterectomy and bilateral 4 compartment fasciotomies on 08/04/22.   OT comments  Patient received in supine and agreeable to OT/PT session stating she would like to get out of bed. Patient continues to require assistance of 2 to get to EOB and was able to keep balance on EOB with min to mod assist. Patient tolerated transfer to recliner with +2 assist with use of bed pads to assist. Patient demonstrated gains with grooming and self feeding while seated in recliner. Discharge recommendations continue to be appropriate for SNF for continued rehab. Acute OT to continue to follow.    Recommendations for follow up therapy are one component of a multi-disciplinary discharge planning process, led by the attending physician.  Recommendations may be updated based on patient status, additional functional criteria and insurance authorization.    Follow Up Recommendations  Skilled nursing-short term rehab (<3 hours/day)     Assistance Recommended at Discharge Frequent or constant Supervision/Assistance  Patient can return home with the following  Two people to help with walking and/or transfers;A lot of help with bathing/dressing/bathroom;Assistance with cooking/housework;Assistance with feeding;Direct supervision/assist for medications management;Direct supervision/assist for financial management;Assist for transportation;Help with stairs  or ramp for entrance   Equipment Recommendations  None recommended by OT (defer to next venue)    Recommendations for Other Services      Precautions / Restrictions Precautions Precautions: Fall Precaution Comments: pain L LE>R with movement Restrictions Weight Bearing Restrictions: No       Mobility Bed Mobility Overal bed mobility: Needs Assistance Bed Mobility: Supine to Sit     Supine to sit: Max assist, +2 for physical assistance     General bed mobility comments: use of bed pad for transition from supine to sitting and increased time due to complaints of pain    Transfers Overall transfer level: Needs assistance Equipment used: None Transfers: Bed to chair/wheelchair/BSC            Lateral/Scoot Transfers: Max assist, +2 physical assistance General transfer comment: lateral scoot transfer to left to recliner with use of bed pads and max assist x2     Balance Overall balance assessment: Needs assistance Sitting-balance support: Feet supported, Bilateral upper extremity supported Sitting balance-Leahy Scale: Poor Sitting balance - Comments: mod to min assist for sitting balance due to difficulty with trunk control                                   ADL either performed or assessed with clinical judgement   ADL Overall ADL's : Needs assistance/impaired Eating/Feeding: Set up;Sitting Eating/Feeding Details (indicate cue type and reason): provided setup while seated in recliner Grooming: Wash/dry hands;Wash/dry face;Oral care;Minimal assistance;Sitting Grooming Details (indicate cue type and reason): in recliner             Lower Body Dressing: Maximal assistance;Bed level Lower Body Dressing Details (indicate cue type  and reason): to donn socks               General ADL Comments: limited by pain on EOB    Extremity/Trunk Assessment Upper Extremity Assessment RUE Sensation: decreased light touch            Vision        Perception     Praxis      Cognition Arousal/Alertness: Awake/alert Behavior During Therapy: Anxious Overall Cognitive Status: Impaired/Different from baseline Area of Impairment: Attention, Memory, Following commands, Safety/judgement, Awareness, Problem solving, Orientation                 Orientation Level: Situation Current Attention Level: Sustained Memory: Decreased short-term memory Following Commands: Follows one step commands with increased time, Follows one step commands inconsistently Safety/Judgement: Decreased awareness of safety, Decreased awareness of deficits Awareness: Emergent Problem Solving: Requires verbal cues, Requires tactile cues, Slow processing General Comments: patient eager to participate        Exercises      Shoulder Instructions       General Comments      Pertinent Vitals/ Pain       Pain Assessment Pain Assessment: Faces Faces Pain Scale: Hurts even more Pain Location: L >R feet with movement Pain Descriptors / Indicators: Aching, Discomfort, Grimacing, Guarding Pain Intervention(s): Limited activity within patient's tolerance, Monitored during session, Repositioned  Home Living                                          Prior Functioning/Environment              Frequency  Min 2X/week        Progress Toward Goals  OT Goals(current goals can now be found in the care plan section)  Progress towards OT goals: Progressing toward goals  Acute Rehab OT Goals Patient Stated Goal: get better OT Goal Formulation: With patient Time For Goal Achievement: 08/22/22 Potential to Achieve Goals: Good ADL Goals Pt Will Perform Lower Body Bathing: with min assist;sit to/from stand;sitting/lateral leans Pt Will Perform Lower Body Dressing: with min assist;sitting/lateral leans;sit to/from stand Pt Will Transfer to Toilet: with min assist;with +2 assist;bedside commode;stand pivot transfer Pt Will Perform  Toileting - Clothing Manipulation and hygiene: with min assist;sitting/lateral leans;sit to/from stand Additional ADL Goal #1: Patient will be able to sit EOB without need for external assist as a precursor to OOB activites.  Plan Discharge plan remains appropriate    Co-evaluation    PT/OT/SLP Co-Evaluation/Treatment: Yes Reason for Co-Treatment: For patient/therapist safety;To address functional/ADL transfers   OT goals addressed during session: ADL's and self-care      AM-PAC OT "6 Clicks" Daily Activity     Outcome Measure   Help from another person eating meals?: A Little Help from another person taking care of personal grooming?: A Little Help from another person toileting, which includes using toliet, bedpan, or urinal?: A Lot Help from another person bathing (including washing, rinsing, drying)?: A Lot Help from another person to put on and taking off regular upper body clothing?: A Lot Help from another person to put on and taking off regular lower body clothing?: Total 6 Click Score: 13    End of Session Equipment Utilized During Treatment: Oxygen  OT Visit Diagnosis: Unsteadiness on feet (R26.81);Other abnormalities of gait and mobility (R26.89);Muscle weakness (generalized) (M62.81);Pain Pain - Right/Left: Left Pain -  part of body: Leg   Activity Tolerance Patient limited by pain   Patient Left in chair;with call bell/phone within reach;with chair alarm set   Nurse Communication Mobility status        Time: 1030-1314 OT Time Calculation (min): 29 min  Charges: OT General Charges $OT Visit: 1 Visit OT Treatments $Self Care/Home Management : 8-22 mins  Lodema Hong, [place]  Office [phone]   Trixie Dredge 08/12/2022, 11:21 AM

## 2022-08-12 NOTE — Progress Notes (Addendum)
  Progress Note    08/12/2022 8:20 AM 8 Days Post-Op  Subjective:  denies any pain    Vitals:   08/12/22 0700 08/12/22 0808  BP: (!) 121/51   Pulse: 99 99  Resp: 16 14  Temp: 98.2 F (36.8 C)   SpO2: 93%     Physical Exam: General:  NGT Cardiac:  NSR Lungs:  nonlabored Incisions:  bilateral groin incisions intact with staples. Bilateral lower extremity fasciotomies with dry bandaging Extremities:  No palpable or dopplerable pulses in LLE, foot is cool to touch. R foot is warm with DP/Pt doppler signal  CBC    Component Value Date/Time   WBC 19.6 (H) 08/12/2022 0420   RBC 2.51 (L) 08/12/2022 0420   HGB 7.7 (L) 08/12/2022 0420   HGB 14.3 06/20/2012 2127   HCT 23.6 (L) 08/12/2022 0420   HCT 42.9 06/20/2012 2127   PLT 314 08/12/2022 0420   PLT 357 06/20/2012 2127   MCV 94.0 08/12/2022 0420   MCV 91 06/20/2012 2127   MCH 30.7 08/12/2022 0420   MCHC 32.6 08/12/2022 0420   RDW 15.9 (H) 08/12/2022 0420   RDW 13.6 06/20/2012 2127   LYMPHSABS 1.8 11/15/2020 0539   MONOABS 0.8 11/15/2020 0539   EOSABS 0.4 11/15/2020 0539   BASOSABS 0.0 11/15/2020 0539    BMET    Component Value Date/Time   NA 136 08/12/2022 0420   NA 135 (L) 06/20/2012 2127   K 4.3 08/12/2022 0420   K 4.0 06/20/2012 2127   CL 104 08/12/2022 0420   CL 100 06/20/2012 2127   CO2 26 08/12/2022 0420   CO2 27 06/20/2012 2127   GLUCOSE 277 (H) 08/12/2022 0420   GLUCOSE 250 (H) 06/20/2012 2127   BUN 43 (H) 08/12/2022 0420   BUN 20 (H) 06/20/2012 2127   CREATININE 0.74 08/12/2022 0420   CREATININE 0.60 06/20/2012 2127   CALCIUM 8.1 (L) 08/12/2022 0420   CALCIUM 8.8 06/20/2012 2127   GFRNONAA >60 08/12/2022 0420   GFRNONAA >60 06/20/2012 2127   GFRAA >60 06/20/2012 2127    INR    Component Value Date/Time   INR 1.0 08/03/2022 2156     Intake/Output Summary (Last 24 hours) at 08/12/2022 0820 Last data filed at 08/12/2022 0426 Gross per 24 hour  Intake 3155.05 ml  Output 1976 ml  Net  1179.05 ml      Assessment/Plan:  71 y.o. female is 8 days post op,s/p:  bilateral lower extremity femoral endarterectomy and patch angiplasty with iliac stenting, four compartment fasciotomies for acute on chronic lower extremity ischemia.      -Bilateral groin and lower extremity incisions intact and dry -LLE exam unchanged. No pulses or doppler signals.  -RLE with DP/PT doppler signals -Pending patient and family discussion on further LLE amputation   Vicente Serene, PA-C [facility] [phone] 08/12/2022 8:20 AM  I have independently interviewed and examined patient and agree with PA assessment and plan above.  I will tentatively schedule for left above-knee amputation on Thursday this week pending further discussion with family.  She is okay for single anti-platelet from vascular standpoint.  Machaela Caterino C. Donzetta Matters, MD [facility] [place] Office: [phone] Pager: [phone]

## 2022-08-12 NOTE — Inpatient Diabetes Management (Addendum)
Inpatient Diabetes Program Recommendations  AACE/ADA: New Consensus Statement on Inpatient Glycemic Control   Target Ranges:  Prepandial:   less than 140 mg/dL      Peak postprandial:   less than 180 mg/dL (1-2 hours)      Critically ill patients:  140 - 180 mg/dL    Latest Reference Range & Units 08/11/22 07:53 08/11/22 11:52 08/11/22 15:36 08/11/22 19:40 08/11/22 23:30 08/12/22 03:42 08/12/22 07:35  Glucose-Capillary 70 - 99 mg/dL 256 (H) 247 (H) 232 (H) 227 (H) 242 (H) 239 (H) 212 (H)    Latest Reference Range & Units 07/02/22 12:11 08/05/22 04:00  Hemoglobin A1C 4.8 - 5.6 % 12.0 (H) 12.9 (H)   Review of Glycemic Control  Diabetes history: DM2 Outpatient Diabetes medications: Lantus 18 units QHS, Metformin 750 mg daily, Novolog 5 units TID Current orders for Inpatient glycemic control: Semglee 18 units daily, Novolog 7 units Q4H, Novolog 0-20 units Q4H; Pivot @ 55 ml/hr  Inpatient Diabetes Program Recommendations:    Insulin: Please consider adding Semglee 10 units QHS (continue Semglee 18 units daily).  HbgA1C:  A1C 12.9% on 08/05/22 indicating an average glucose of 324 mg/dl over the past 2-3 months.  Addendum 08/12/22'@11'$ :55-Spoke with patient at bedside about diabetes and home regimen for diabetes control. Patient reports being followed by Endocrinologist and was last seen about 3 months ago. Patient confirms she is taking Lantus 18 units QHS, Metformin 750 mg daily, and Novolog 5 units TID Levemir 24 units QHS and Novolog 10 units TID with meals as an outpatient.   Patient reports checking glucose 2-3 times per day and reports that glucose is usually in 200-300's.  Discussed A1C results (12.9% on 08/05/22) and explained that current A1C indicates an average glucose of 324 mg/dl over the past 2-3 months. Discussed glucose and A1C goals. Discussed importance of checking CBGs and maintaining good CBG control to prevent long-term and short-term complications. Explained how hyperglycemia  leads to damage within blood vessels which lead to the common complications seen with uncontrolled diabetes. Stressed to the patient the importance of improving glycemic control to prevent further complications from uncontrolled diabetes. Discussed impact of nutrition, exercise, stress, sickness, and medications on diabetes control.  Patient reports she has everything she needs to DM management. Encouraged patient to contact Endocrinologist and follow up in the near future for assistance with improving outpatient DM control. Patient verbalized understanding of information discussed and reports no further questions at this time related to diabetes.  Thanks, Barnie Alderman, RN, MSN, [place] Diabetes Coordinator Inpatient Diabetes Program [phone] (Team Pager from 8am to [place])

## 2022-08-13 DIAGNOSIS — I614 Nontraumatic intracerebral hemorrhage in cerebellum: Secondary | ICD-10-CM | POA: Diagnosis not present

## 2022-08-13 LAB — COMPREHENSIVE METABOLIC PANEL
ALT: 48 U/L — ABNORMAL HIGH (ref 0–44)
AST: 59 U/L — ABNORMAL HIGH (ref 15–41)
Albumin: 1.8 g/dL — ABNORMAL LOW (ref 3.5–5.0)
Alkaline Phosphatase: 105 U/L (ref 38–126)
Anion gap: 6 (ref 5–15)
BUN: 39 mg/dL — ABNORMAL HIGH (ref 8–23)
CO2: 26 mmol/L (ref 22–32)
Calcium: 8.3 mg/dL — ABNORMAL LOW (ref 8.9–10.3)
Chloride: 107 mmol/L (ref 98–111)
Creatinine, Ser: 0.76 mg/dL (ref 0.44–1.00)
GFR, Estimated: >60 mL/min (ref >60)
Glucose, Bld: 214 mg/dL — ABNORMAL HIGH (ref 70–99)
Potassium: 4 mmol/L (ref 3.5–5.1)
Sodium: 139 mmol/L (ref 135–145)
Total Bilirubin: 0.4 mg/dL (ref 0.3–1.2)
Total Protein: 4.9 g/dL — ABNORMAL LOW (ref 6.5–8.1)

## 2022-08-13 LAB — CBC
HCT: 21.6 % — ABNORMAL LOW (ref 36.0–46.0)
Hemoglobin: 6.8 g/dL — CL (ref 12.0–15.0)
MCH: 30.1 pg (ref 26.0–34.0)
MCHC: 31.5 g/dL (ref 30.0–36.0)
MCV: 95.6 fL (ref 80.0–100.0)
Platelets: 336 10*3/uL (ref 150–400)
RBC: 2.26 MIL/uL — ABNORMAL LOW (ref 3.87–5.11)
RDW: 16.3 % — ABNORMAL HIGH (ref 11.5–15.5)
WBC: 21.2 10*3/uL — ABNORMAL HIGH (ref 4.0–10.5)
nRBC: 0.3 % — ABNORMAL HIGH (ref 0.0–0.2)

## 2022-08-13 LAB — PREPARE RBC (CROSSMATCH)

## 2022-08-13 LAB — HEMOGLOBIN AND HEMATOCRIT, BLOOD
HCT: 26.5 % — ABNORMAL LOW (ref 36.0–46.0)
Hemoglobin: 9 g/dL — ABNORMAL LOW (ref 12.0–15.0)

## 2022-08-13 LAB — GLUCOSE, CAPILLARY
Glucose-Capillary: 252 mg/dL — ABNORMAL HIGH (ref 70–99)
Glucose-Capillary: 242 mg/dL — ABNORMAL HIGH (ref 70–99)
Glucose-Capillary: 107 mg/dL — ABNORMAL HIGH (ref 70–99)
Glucose-Capillary: 101 mg/dL — ABNORMAL HIGH (ref 70–99)
Glucose-Capillary: 214 mg/dL — ABNORMAL HIGH (ref 70–99)

## 2022-08-13 LAB — CK: Total CK: 2499 U/L — ABNORMAL HIGH (ref 38–234)

## 2022-08-13 MED ORDER — ALTEPLASE 2 MG IJ SOLR
2.0000 mg | Freq: Once | INTRAMUSCULAR | Status: AC
  Administered 2022-08-13: 2 mg
  Filled 2022-08-13: qty 2

## 2022-08-13 MED ORDER — SODIUM CHLORIDE 0.9% IV SOLUTION: Freq: Once | INTRAVENOUS | Status: AC

## 2022-08-13 MED ORDER — SODIUM CHLORIDE 0.9% FLUSH
10.0000 mL | Freq: Two times a day (BID) | INTRAVENOUS | Status: DC
  Administered 2022-08-13: 10 mL
  Administered 2022-08-14: 30 mL
  Administered 2022-08-15 (×2): 10 mL
  Administered 2022-08-16: 30 mL
  Administered 2022-08-16 – 2022-08-17 (×2): 10 mL
  Administered 2022-08-18: 40 mL
  Administered 2022-08-18 – 2022-08-22 (×8): 10 mL
  Administered 2022-08-23: 20 mL
  Administered 2022-08-23 – 2022-08-24 (×2): 10 mL
  Administered 2022-08-25 (×2): 20 mL
  Administered 2022-08-26 – 2022-08-28 (×4): 10 mL
  Administered 2022-08-28: 30 mL
  Administered 2022-08-29 – 2022-09-19 (×25): 10 mL

## 2022-08-13 MED ORDER — SODIUM CHLORIDE 0.9% FLUSH
10.0000 mL | INTRAVENOUS | Status: DC | PRN
  Administered 2022-08-26 (×2): 20 mL

## 2022-08-13 MED ORDER — OXYCODONE HCL ER 10 MG PO T12A
10.0000 mg | EXTENDED_RELEASE_TABLET | Freq: Two times a day (BID) | ORAL | Status: DC
  Administered 2022-08-13 – 2022-09-22 (×79): 10 mg via ORAL
  Filled 2022-08-13 (×80): qty 1

## 2022-08-13 NOTE — Plan of Care (Signed)
  Problem: Health Behavior/Discharge Planning: Goal: Ability to manage health-related needs will improve Outcome: Not Progressing   Problem: Skin Integrity: Goal: Risk for impaired skin integrity will decrease Outcome: Not Progressing

## 2022-08-13 NOTE — Progress Notes (Signed)
Critical hgb 6.8. patient asymptomatic. Unable to draw any labs from central line d/t line being TPA

## 2022-08-13 NOTE — Progress Notes (Signed)
Heather [name] HRC:[id number] DOB: March 24, 1951 DOA: 08/04/2022 PCP: [name], FNP   Subj: 71 year old female with PMH of DM with neuropathy, HTN, HLD, COPD, GERD, PAD, s/p TAVR, chronic diastolic CHF, anxiety/depression/schizophrenia, tobacco abuse, presented to ER with sudden onset of dizziness, blurred vision, headache, nausea and vomiting. BP in EMS 209/76.  She was admitted to ICU for hemorrhagic stroke and bilateral lower extremity acute on chronic ischemia.  Vascular surgery consulted and s/p bilateral lower extremity femoral endarterectomy and patch angioplasty with iliac stenting, 4 compartment fasciotomies for acute on chronic lower extremity ischemia.  Postop remained on vent and Cleviprex.  Transferred to floor and TRH on 12/17.  Neurology and vascular surgery following.  Vascular surgery plans left lower extremity amputation on 12/21.    Obj: 12/20 A/O x 4, positive 7/10 RLE pain.    Objective: VITAL SIGNS: Temp: 99.1 F (37.3 C) (12/20 1247) Temp Source: Oral (12/20 1247) BP: 136/58 (12/20 1247) Pulse Rate: 96 (12/20 1247) SPO2; FIO2:   Intake/Output Summary (Last 24 hours) at 08/13/2022 1348 Last data filed at 08/13/2022 1247 Gross per 24 hour  Intake 2157.94 ml  Output 2200 ml  Net -42.06 ml     Exam: Physical Exam:  General: A/O x 4 No acute respiratory distress Eyes: negative scleral hemorrhage, negative anisocoria, negative icterus ENT: Negative Runny nose, negative gingival bleeding, Neck:  Negative scars, masses, torticollis, lymphadenopathy, JVD Lungs: Clear to auscultation bilaterally without wheezes or crackles Cardiovascular: Regular rate and rhythm without murmur gallop or rub normal S1 and S2 Abdomen: negative abdominal pain, nondistended, positive soft, bowel sounds, no rebound, no ascites, no appreciable mass Extremities: positive pain to palpation RIGHT lower extremity Skin: Negative rashes, lesions, ulcers Psychiatric:  Negative depression,  negative anxiety, negative fatigue, negative mania  Central nervous system:  Cranial nerves II through XII intact, negative dysarthria, negative expressive aphasia, negative receptive aphasia.    Mobility Assessment (last 72 hours)     Mobility Assessment     Row Name 08/12/22 2000 08/12/22 1255 08/12/22 1100 08/11/22 2044 08/11/22 0736   Does patient have an order for bedrest or is patient medically unstable No - Continue assessment -- -- No - Continue assessment Yes- Bedfast (Level 1) - Complete   What is the highest level of mobility based on the progressive mobility assessment? Level 2 (Chairfast) - Balance while sitting on edge of bed and cannot stand Level 2 (Chairfast) - Balance while sitting on edge of bed and cannot stand Level 2 (Chairfast) - Balance while sitting on edge of bed and cannot stand Level 1 (Bedfast) - Unable to balance while sitting on edge of bed Level 1 (Bedfast) - Unable to balance while sitting on edge of bed   Is the above level different from baseline mobility prior to current illness? Yes - Recommend PT order -- -- Yes - Recommend PT order Yes - Recommend PT order              DVT prophylaxis: Subcu heparin Code Status: Full Family Communication:  Status is: Inpatient    Dispo: The patient is from: Home              Anticipated d/c is to: Home              Anticipated d/c date is: > 3 days              Patient currently is not medically stable to d/c.    Procedures/Significant Events: 12/11 Ehocardiogram  Left Ventricle:  Left ventricular ejection fraction, by estimation, is 60  to 65%. The left ventricle has normal function. The left ventricle has no  regional wall motion abnormalities. The left ventricular internal cavity  size was normal in size.  Suboptimal image quality limits for assessment of left ventricular  hypertrophy. Left ventricular diastolic parameters are indeterminate.   Right Ventricle: The right ventricular size is normal.  Right vetricular  wall thickness was not well visualized. Right ventricular systolic  function is normal.    Mitral Valve: The mitral valve is abnormal. There is moderate thickening  of the mitral valve leaflet(s). There is moderate calcification of the  mitral valve leaflet(s). Moderate mitral annular calcification. Trivial  mitral valve regurgitation. Mild  mitral valve stenosis. MV peak gradient, 18.6 mmHg. The mean mitral valve  gradient is 6.5 mmHg with average heart rate of 95 bpm.   Tricuspid Valve: The tricuspid valve is grossly normal. Tricuspid valve  regurgitation is trivial. No evidence of tricuspid stenosis.   Aortic Valve: The aortic valve has been repaired/replaced. Aortic valve  regurgitation is trivial. Aortic valve mean gradient measures 9.2 mmHg.  Aortic valve peak gradient measures 17.8 mmHg. Aortic valve area, by VTI  measures 2.35 cm. There is a 23 mm  Sapien prosthetic, stented (TAVR) valve present in the aortic position.  Procedure Date: 09/2016. Echo findings are consistent with normal structure  and function of the aortic valve prosthesis.   Pulmonic Valve: The pulmonic valve was not well visualized. Pulmonic valve  regurgitation is not visualized.   Aorta: The aortic root and ascending aorta are structurally normal, with  no evidence of dilitation.   Venous: The inferior vena cava is normal in size with greater than 50%  respiratory variability, suggesting right atrial pressure of 3 mmHg.    Consultants:  Vascular surgery   Cultures   Antimicrobials:    Assessment & Plan: Covid vaccination;   Principal Problem:   ICH (intracerebral hemorrhage) ([place])  Bilateral lower extremity acute on chronic ischemia: - s/p bilateral lower extremity femoral endarterectomy and patch angioplasty with iliac stenting, 4 compartment fasciotomiesPost op care per vasc surgery  - ck decreasing, 6462 on 12/16, down to 3379 on 12/19. -Vascular surgery follow-up  appreciated.  LLE with no palpable or dopplerable pulses, cool foot to touch with mottling of the toes, reportedly worse than before.   - Anticipating she will get an AKA on 12/21 as discussed today with Dr. Donzetta Matters, [facility]. -IV heparin discontinued 12/16 and now on aspirin 81 Mg daily and Plavix 75 Mg daily.  See discussion below regarding antiplatelets. -Discussed with Dr. Leonie Man, neurology on 12/19.  From a stroke perspective, patient only needs a single antiplatelet agent.  If vascular surgery wishes to continue Plavix then he recommends discontinuing aspirin.  Communicated with Dr. Donzetta Matters, [place] to continue Plavix. ASA DC'ed. -12/20 vascular surgery plans LEFT AKA on 12/21   Acute cerebellar vermis intraparenchymal hemorrhage  Management per neurology/stroke team. Code stroke CT: Acute intraparenchymal hematoma within the cerebellar vermis measuring 11 cc in volume and mild mass effect upon the fourth ventricle. CTA head and neck: Negative for large vessel occlusion.  70% stenosis of innominate artery origin. Follow-up CT head 12/12: Stable intraparenchymal hematoma within the cerebellar vermis.  Stable moderate mass effect upon the fourth ventricle. MRI brain stable left cerebellar ICH with extension to fourth ventricle.  No hydrocephalus. Repeat CT head 12/14: Stable hematoma and no hydrocephalus. TTE: LVEF 60-65%, LDL 34, A1c 12.9. Was on aspirin 81 Mg  daily + Plavix 75 Mg daily PTA, then here on IV heparin per stroke protocol which was switched to aspirin 81 Mg daily on 12/17.  Remains on Plavix 75 Mg daily as well. As per therapy recommendations, SNF when medically optimized. -Neurology signed off 12/18.   -Please see above regarding discussion with Dr. Leonie Man on 12/19 regarding antiplatelets. -12/20 negative neurological deficit   Acute respiratory failure with hypoxia Secondary to acute cerebellar hemorrhagic stroke and postop Extubated 12/14. -12/20 on room air   Hx of depression, bipolar,  schizophrenia, insomnia. - trazodone at night    HTN emergency. -Coreg 12.5 mg BID -Hydralazine PRN -Labetalol PRN -12/20 BP currently controlled  Hx of CAD, chronic HFpEF, HLD, HTN, s/p TAVR. - goal SBP <160 -Antiplatelet management as above.  Cozaar being held due to AKI. - holding lipitor in setting of elevated CK  - Got a couple doses of IV Lasix in ICU. -12/20 echocardiogram does not support diagnosis of HFpEF -Troponins never trended.  Patient currently without CP   Hx of COPD with tobacco abuse. -triple therapy nebs  -No clinical exacerbation at this time.   AKI from rhabdomyolysis. -Trend CK  Latest Reference Range & Units 08/08/22 05:09 08/09/22 07:42 08/11/22 03:57 08/12/22 04:20 08/13/22 02:18  CK Total 38 - 234 U/L 15,580 (H) 6,462 (H) 3,982 (H) 3,379 (H) 2,499 (H)  (H): Data is abnormally high  Non-gap metabolic acidosis. -AKI and metabolic acidosis resolved -Resolved.   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 -12/12 hemoglobin A1c= 12.9 -Semglee 18 units daily - NovoLog 7 units q 4 hrs - Resistant SSI  DM neuropathy. -See DM type II   Anemia of critical illness. - f/u CBC - transfuse for hgb <7 -Hemoglobin has dropped from 8.5-7.9 >7.6>7.7 in the absence of overt bleeding.  Follow CBC in a.m. and transfuse if hemoglobin 7 g or less. -12/20 transfuse 2 unit PRBC -   Dysphagia: Likely related to hemorrhagic stroke Has left nasal core track with ongoing tube feeds. Failed swallow evaluation on 12/16. As per SLP follow-up, completed FEES and recommended dysphagia to 2 diet with thin liquids on 12/18. -12/20 positive anorexia    Nutritional Status Nutrition Problem: Increased nutrient needs Etiology: wound healing Signs/Symptoms: estimated needs Interventions: Tube feeding, MVI Pressure Injury 08/04/22 Sacrum Stage 2 -  Partial thickness loss of dermis presenting as a shallow open injury with a red, pink wound bed without slough. (Active)   08/04/22 0100  Location: Sacrum  Location Orientation:   Staging: Stage 2 -  Partial thickness loss of dermis presenting as a shallow open injury with a red, pink wound bed without slough.  Wound Description (Comments):   Present on Admission: Yes  Dressing Type Foam - Lift dressing to assess site every shift 08/13/22 0940        Mobility Assessment (last 72 hours)     Mobility Assessment     Row Name 08/12/22 2000 08/12/22 1255 08/12/22 1100 08/11/22 2044 08/11/22 0736   Does patient have an order for bedrest or is patient medically unstable No - Continue assessment -- -- No - Continue assessment Yes- Bedfast (Level 1) - Complete   What is the highest level of mobility based on the progressive mobility assessment? Level 2 (Chairfast) - Balance while sitting on edge of bed and cannot stand Level 2 (Chairfast) - Balance while sitting on edge of bed and cannot stand Level 2 (Chairfast) - Balance while sitting on edge of bed and cannot stand Level  1 (Bedfast) - Unable to balance while sitting on edge of bed Level 1 (Bedfast) - Unable to balance while sitting on edge of bed   Is the above level different from baseline mobility prior to current illness? Yes - Recommend PT order -- -- Yes - Recommend PT order Yes - Recommend PT order                     Care during the described time interval was provided by me .  I have reviewed this patient's available data, including medical history, events of note, physical examination, and all test results as part of my evaluation.

## 2022-08-13 NOTE — Inpatient Diabetes Management (Addendum)
Inpatient Diabetes Program Recommendations  AACE/ADA: New Consensus Statement on Inpatient Glycemic Control   Target Ranges:  Prepandial:   less than 140 mg/dL      Peak postprandial:   less than 180 mg/dL (1-2 hours)      Critically ill patients:  140 - 180 mg/dL    Latest Reference Range & Units 08/12/22 07:35 08/12/22 12:23 08/12/22 15:40 08/12/22 20:10 08/12/22 23:16 08/13/22 04:45 08/13/22 08:14  Glucose-Capillary 70 - 99 mg/dL 212 (H) 275 (H) 192 (H) 117 (H) 271 (H) 252 (H) 242 (H)   Review of Glycemic Control  Diabetes history: DM2 Outpatient Diabetes medications: Lantus 18 units QHS, Metformin 750 mg daily, Novolog 5 units TID Current orders for Inpatient glycemic control: Semglee 18 units daily, Novolog 7 units Q4H, Novolog 0-20 units Q4H; Pivot @ 60 ml/hr   Inpatient Diabetes Program Recommendations:     Insulin: Noted tube feedings changed to nocturnal feeds (6p-6a); please change Novolog 7 units to TID at 20:00, 00:00, and 4:00.    HbgA1C:  A1C 12.9% on 08/05/22 indicating an average glucose of 324 mg/dl over the past 2-3 months.  Thanks, Barnie Alderman, RN, MSN, [place] Diabetes Coordinator Inpatient Diabetes Program [phone] (Team Pager from 8am to [place])

## 2022-08-13 NOTE — Progress Notes (Signed)
2 Units of PRBC ordered for pt. Per Dr. Sherral Hammers only give 1 Unit of PRBC at this time

## 2022-08-13 NOTE — Anesthesia Preprocedure Evaluation (Signed)
Anesthesia Evaluation  Patient identified by MRN, date of birth, ID band Patient awake    Reviewed: Allergy & Precautions, NPO status , Patient's Chart, lab work & pertinent test results, reviewed documented beta blocker date and time   History of Anesthesia Complications Negative for: history of anesthetic complications  Airway Mallampati: III  TM Distance: >3 FB Neck ROM: Full  Mouth opening: Limited Mouth Opening  Dental  (+) Edentulous Lower, Edentulous Upper   Pulmonary COPD,  COPD inhaler, Current Smoker and Patient abstained from smoking.    + decreased breath sounds+ wheezing      Cardiovascular hypertension, Pt. on home beta blockers and Pt. on medications + CAD, + Past MI and + Peripheral Vascular Disease  Normal cardiovascular exam   S/p TAVR  '23 TTE - EF 60 to 65%. Trivial mitral valve regurgitation. Mild mitral stenosis. The mean mitral valve gradient is 6.5 mmHg with average heart rate of 95 bpm. Aortic valve  regurgitation is trivial. There is a 23 mm Sapien prosthetic (TAVR) valve present in the aortic position. Procedure Date: 09/2016. Echo findings are consistent with normal structure and function of the aortic valve prosthesis.     Neuro/Psych  PSYCHIATRIC DISORDERS Anxiety Depression Bipolar Disorder Schizophrenia   Vertigo   Neuromuscular disease    GI/Hepatic Neg liver ROS,GERD  Medicated and Controlled,, Chronic mesenteric ischemia    Endo/Other  diabetes, Type 2, Insulin Dependent    Renal/GU Renal disease     Musculoskeletal  (+) Arthritis ,    Abdominal   Peds  Hematology  On plavix    Anesthesia Other Findings   Reproductive/Obstetrics                              Anesthesia Physical Anesthesia Plan  ASA: 4  Anesthesia Plan: General   Post-op Pain Management: Tylenol PO (pre-op)* and Dilaudid IV   Induction: Intravenous  PONV Risk Score and Plan: 3  and Treatment may vary due to age or medical condition, Ondansetron and Midazolam  Airway Management Planned: LMA  Additional Equipment: None  Intra-op Plan:   Post-operative Plan: Extubation in OR  Informed Consent: I have reviewed the patients History and Physical, chart, labs and discussed the procedure including the risks, benefits and alternatives for the proposed anesthesia with the patient or authorized representative who has indicated his/her understanding and acceptance.     Dental advisory given  Plan Discussed with: CRNA and Anesthesiologist  Anesthesia Plan Comments:          Anesthesia Quick Evaluation

## 2022-08-13 NOTE — Progress Notes (Addendum)
  Progress Note    08/13/2022 8:39 AM 9 Days Post-Op  Subjective:  denies any pain, resting comfortably    Vitals:   08/13/22 0726 08/13/22 0812  BP:  (!) 133/52  Pulse: 93 89  Resp: 18 18  Temp:  98.6 F (37 C)  SpO2:  95%    Physical Exam: Lungs:  nonlabored Incisions:  bilateral groin and lower extremity incisions c/d/l. Lower extremities dressed with bandages Extremities:  R foot warm with PT doppler signal. L foot cool to touch with no signals or palpable pulses  CBC    Component Value Date/Time   WBC 21.2 (H) 08/13/2022 0218   RBC 2.26 (L) 08/13/2022 0218   HGB 6.8 (LL) 08/13/2022 0218   HGB 14.3 06/20/2012 2127   HCT 21.6 (L) 08/13/2022 0218   HCT 42.9 06/20/2012 2127   PLT 336 08/13/2022 0218   PLT 357 06/20/2012 2127   MCV 95.6 08/13/2022 0218   MCV 91 06/20/2012 2127   MCH 30.1 08/13/2022 0218   MCHC 31.5 08/13/2022 0218   RDW 16.3 (H) 08/13/2022 0218   RDW 13.6 06/20/2012 2127   LYMPHSABS 1.8 11/15/2020 0539   MONOABS 0.8 11/15/2020 0539   EOSABS 0.4 11/15/2020 0539   BASOSABS 0.0 11/15/2020 0539    BMET    Component Value Date/Time   NA 139 08/13/2022 0218   NA 135 (L) 06/20/2012 2127   K 4.0 08/13/2022 0218   K 4.0 06/20/2012 2127   CL 107 08/13/2022 0218   CL 100 06/20/2012 2127   CO2 26 08/13/2022 0218   CO2 27 06/20/2012 2127   GLUCOSE 214 (H) 08/13/2022 0218   GLUCOSE 250 (H) 06/20/2012 2127   BUN 39 (H) 08/13/2022 0218   BUN 20 (H) 06/20/2012 2127   CREATININE 0.76 08/13/2022 0218   CREATININE 0.60 06/20/2012 2127   CALCIUM 8.3 (L) 08/13/2022 0218   CALCIUM 8.8 06/20/2012 2127   GFRNONAA >60 08/13/2022 0218   GFRNONAA >60 06/20/2012 2127   GFRAA >60 06/20/2012 2127    INR    Component Value Date/Time   INR 1.0 08/03/2022 2156     Intake/Output Summary (Last 24 hours) at 08/13/2022 0839 Last data filed at 08/13/2022 9485 Gross per 24 hour  Intake 1783.94 ml  Output 2050 ml  Net -266.06 ml      Assessment/Plan:   71 y.o. female is 9 days post op,s/p:  bilateral lower extremity femoral endarterectomy and patch angiplasty with iliac stenting, four compartment fasciotomies for acute on chronic lower extremity ischemia.        -Bilateral groin incisions clean and dry. Lower extremity incisions intact with bandages -RLE warm with DP/PT doppler signals -Will pre-op patient for tentative L AKA tomorrow with Dr.Whitman Meinhardt    Vicente Serene, PA-C [facility] [phone] 08/13/2022 8:39 AM   I have independently interviewed and examined patient and agree with PA assessment and plan above. On plavix single agent for recent stenting. L AKA tomorrow scheduled as first case. Npo past midnight.  Terez Montee C. Donzetta Matters, MD [facility] [place] Office: [phone] Pager: [phone]

## 2022-08-13 NOTE — Progress Notes (Signed)
Central  line currently being TPA. Unable to draw any labs

## 2022-08-14 ENCOUNTER — Inpatient Hospital Stay (HOSPITAL_COMMUNITY): Payer: Medicare HMO | Admitting: Anesthesiology

## 2022-08-14 ENCOUNTER — Other Ambulatory Visit: Payer: Self-pay

## 2022-08-14 ENCOUNTER — Encounter (HOSPITAL_COMMUNITY)
Admission: EM | Disposition: A | Discharge status: Skilled Nursing Facility | Payer: Self-pay | Source: Other Acute Inpatient Hospital | Attending: Internal Medicine

## 2022-08-14 DIAGNOSIS — Z794 Long term (current) use of insulin: Secondary | ICD-10-CM

## 2022-08-14 DIAGNOSIS — E1151 Type 2 diabetes mellitus with diabetic peripheral angiopathy without gangrene: Secondary | ICD-10-CM

## 2022-08-14 DIAGNOSIS — I70222 Atherosclerosis of native arteries of extremities with rest pain, left leg: Secondary | ICD-10-CM | POA: Diagnosis not present

## 2022-08-14 DIAGNOSIS — I96 Gangrene, not elsewhere classified: Secondary | ICD-10-CM | POA: Diagnosis not present

## 2022-08-14 DIAGNOSIS — F17210 Nicotine dependence, cigarettes, uncomplicated: Secondary | ICD-10-CM

## 2022-08-14 DIAGNOSIS — J449 Chronic obstructive pulmonary disease, unspecified: Secondary | ICD-10-CM | POA: Diagnosis not present

## 2022-08-14 HISTORY — PX: GROIN DEBRIDEMENT: SHX5159

## 2022-08-14 HISTORY — PX: AMPUTATION: SHX166

## 2022-08-14 HISTORY — PX: APPLICATION OF WOUND VAC: SHX5189

## 2022-08-14 LAB — CBC
HCT: 31.6 % — ABNORMAL LOW (ref 36.0–46.0)
Hemoglobin: 10.6 g/dL — ABNORMAL LOW (ref 12.0–15.0)
MCH: 30.5 pg (ref 26.0–34.0)
MCHC: 33.5 g/dL (ref 30.0–36.0)
MCV: 91.1 fL (ref 80.0–100.0)
Platelets: 378 10*3/uL (ref 150–400)
RBC: 3.47 MIL/uL — ABNORMAL LOW (ref 3.87–5.11)
RDW: 16.6 % — ABNORMAL HIGH (ref 11.5–15.5)
WBC: 21.4 10*3/uL — ABNORMAL HIGH (ref 4.0–10.5)
nRBC: 0.2 % (ref 0.0–0.2)

## 2022-08-14 LAB — GLUCOSE, CAPILLARY
Glucose-Capillary: 128 mg/dL — ABNORMAL HIGH (ref 70–99)
Glucose-Capillary: 168 mg/dL — ABNORMAL HIGH (ref 70–99)
Glucose-Capillary: 193 mg/dL — ABNORMAL HIGH (ref 70–99)
Glucose-Capillary: 208 mg/dL — ABNORMAL HIGH (ref 70–99)
Glucose-Capillary: 211 mg/dL — ABNORMAL HIGH (ref 70–99)
Glucose-Capillary: 228 mg/dL — ABNORMAL HIGH (ref 70–99)

## 2022-08-14 SURGERY — AMPUTATION, ABOVE KNEE
Anesthesia: General | Site: Knee | Laterality: Left

## 2022-08-14 MED ORDER — ONDANSETRON HCL 4 MG/2ML IJ SOLN
INTRAMUSCULAR | Status: AC
  Filled 2022-08-14: qty 2

## 2022-08-14 MED ORDER — ONDANSETRON HCL 4 MG/2ML IJ SOLN: 4.0000 mg | Freq: Once | INTRAMUSCULAR | Status: DC | PRN

## 2022-08-14 MED ORDER — PROPOFOL 10 MG/ML IV BOLUS
INTRAVENOUS | Status: DC | PRN
  Administered 2022-08-14: 100 mg via INTRAVENOUS

## 2022-08-14 MED ORDER — PHENYLEPHRINE HCL-NACL 20-0.9 MG/250ML-% IV SOLN
INTRAVENOUS | Status: DC | PRN
  Administered 2022-08-14: 40 ug/min via INTRAVENOUS

## 2022-08-14 MED ORDER — HEPARIN 6000 UNIT IRRIGATION SOLUTION
Status: DC | PRN
  Administered 2022-08-14: 1

## 2022-08-14 MED ORDER — OXYCODONE HCL 5 MG PO TABS: 5.0000 mg | ORAL_TABLET | Freq: Once | ORAL | Status: DC | PRN

## 2022-08-14 MED ORDER — FENTANYL CITRATE (PF) 250 MCG/5ML IJ SOLN
INTRAMUSCULAR | Status: AC
  Filled 2022-08-14: qty 5

## 2022-08-14 MED ORDER — CEFAZOLIN SODIUM-DEXTROSE 2-3 GM-%(50ML) IV SOLR
INTRAVENOUS | Status: DC | PRN
  Administered 2022-08-14: 2 g via INTRAVENOUS

## 2022-08-14 MED ORDER — ACETAMINOPHEN 10 MG/ML IV SOLN
INTRAVENOUS | Status: AC
  Filled 2022-08-14: qty 100

## 2022-08-14 MED ORDER — VANCOMYCIN HCL IN DEXTROSE 1-5 GM/200ML-% IV SOLN
1000.0000 mg | INTRAVENOUS | Status: DC
  Administered 2022-08-15: 1000 mg via INTRAVENOUS
  Filled 2022-08-14: qty 200

## 2022-08-14 MED ORDER — VANCOMYCIN HCL 1500 MG/300ML IV SOLN
1500.0000 mg | Freq: Once | INTRAVENOUS | Status: AC
  Administered 2022-08-14: 1500 mg via INTRAVENOUS
  Filled 2022-08-14: qty 300

## 2022-08-14 MED ORDER — OXYCODONE HCL 5 MG/5ML PO SOLN: 5.0000 mg | Freq: Once | ORAL | Status: DC | PRN

## 2022-08-14 MED ORDER — LIDOCAINE 2% (20 MG/ML) 5 ML SYRINGE
INTRAMUSCULAR | Status: DC | PRN
  Administered 2022-08-14: 60 mg via INTRAVENOUS

## 2022-08-14 MED ORDER — HYDROMORPHONE HCL 1 MG/ML IJ SOLN
INTRAMUSCULAR | Status: AC
  Filled 2022-08-14: qty 0.5

## 2022-08-14 MED ORDER — HYDROMORPHONE HCL 1 MG/ML IJ SOLN
INTRAMUSCULAR | Status: DC | PRN
  Administered 2022-08-14: 1 mg via INTRAVENOUS

## 2022-08-14 MED ORDER — 0.9 % SODIUM CHLORIDE (POUR BTL) OPTIME
TOPICAL | Status: DC | PRN
  Administered 2022-08-14 (×2): 1000 mL

## 2022-08-14 MED ORDER — EPHEDRINE 5 MG/ML INJ
INTRAVENOUS | Status: AC
  Filled 2022-08-14: qty 5

## 2022-08-14 MED ORDER — HEPARIN 6000 UNIT IRRIGATION SOLUTION
Status: AC
  Filled 2022-08-14: qty 500

## 2022-08-14 MED ORDER — LIDOCAINE 2% (20 MG/ML) 5 ML SYRINGE
INTRAMUSCULAR | Status: AC
  Filled 2022-08-14: qty 5

## 2022-08-14 MED ORDER — PIPERACILLIN-TAZOBACTAM 3.375 G IVPB
3.3750 g | Freq: Three times a day (TID) | INTRAVENOUS | Status: DC
  Administered 2022-08-14 – 2022-08-18 (×13): 3.375 g via INTRAVENOUS
  Filled 2022-08-14 (×13): qty 50

## 2022-08-14 MED ORDER — PHENYLEPHRINE 80 MCG/ML (10ML) SYRINGE FOR IV PUSH (FOR BLOOD PRESSURE SUPPORT)
PREFILLED_SYRINGE | INTRAVENOUS | Status: AC
  Filled 2022-08-14: qty 20

## 2022-08-14 MED ORDER — ONDANSETRON HCL 4 MG/2ML IJ SOLN
INTRAMUSCULAR | Status: DC | PRN
  Administered 2022-08-14: 4 mg via INTRAVENOUS

## 2022-08-14 MED ORDER — ACETAMINOPHEN 10 MG/ML IV SOLN
INTRAVENOUS | Status: DC | PRN
  Administered 2022-08-14: 1000 mg via INTRAVENOUS

## 2022-08-14 MED ORDER — FENTANYL CITRATE (PF) 250 MCG/5ML IJ SOLN
INTRAMUSCULAR | Status: DC | PRN
  Administered 2022-08-14 (×2): 50 ug via INTRAVENOUS
  Administered 2022-08-14 (×3): 25 ug via INTRAVENOUS
  Administered 2022-08-14: 50 ug via INTRAVENOUS
  Administered 2022-08-14: 25 ug via INTRAVENOUS

## 2022-08-14 MED ORDER — PROPOFOL 10 MG/ML IV BOLUS
INTRAVENOUS | Status: AC
  Filled 2022-08-14: qty 20

## 2022-08-14 MED ORDER — PHENYLEPHRINE 80 MCG/ML (10ML) SYRINGE FOR IV PUSH (FOR BLOOD PRESSURE SUPPORT)
PREFILLED_SYRINGE | INTRAVENOUS | Status: DC | PRN
  Administered 2022-08-14: 80 ug via INTRAVENOUS
  Administered 2022-08-14: 160 ug via INTRAVENOUS
  Administered 2022-08-14: 80 ug via INTRAVENOUS

## 2022-08-14 MED ORDER — EPHEDRINE SULFATE-NACL 50-0.9 MG/10ML-% IV SOSY
PREFILLED_SYRINGE | INTRAVENOUS | Status: DC | PRN
  Administered 2022-08-14: 2.5 mg via INTRAVENOUS

## 2022-08-14 MED ORDER — FENTANYL CITRATE (PF) 100 MCG/2ML IJ SOLN: 25.0000 ug | INTRAMUSCULAR | Status: DC | PRN

## 2022-08-14 SURGICAL SUPPLY — 67 items
BAG COUNTER SPONGE SURGICOUNT (BAG) ×3 IMPLANT
BANDAGE ESMARK 6X9 LF (GAUZE/BANDAGES/DRESSINGS) ×3 IMPLANT
BLADE SAGITTAL (BLADE)
BLADE SAGITTAL 25.0X1.19X90 (BLADE) IMPLANT
BLADE SAW GIGLI 510 (BLADE) ×3 IMPLANT
BLADE SAW THK.89X75X18XSGTL (BLADE) IMPLANT
BNDG COHESIVE 6X5 TAN STRL LF (GAUZE/BANDAGES/DRESSINGS) ×3 IMPLANT
BNDG ELASTIC 4X5.8 VLCR STR LF (GAUZE/BANDAGES/DRESSINGS) ×3 IMPLANT
BNDG ELASTIC 6X5.8 VLCR STR LF (GAUZE/BANDAGES/DRESSINGS) ×3 IMPLANT
BNDG ESMARK 6X9 LF (GAUZE/BANDAGES/DRESSINGS) ×3
BNDG GAUZE DERMACEA FLUFF 4 (GAUZE/BANDAGES/DRESSINGS) ×3 IMPLANT
CANISTER SUCT 3000ML PPV (MISCELLANEOUS) ×3 IMPLANT
CANISTER WOUNDNEG PRESSURE 500 (CANNISTER) IMPLANT
CLIP LIGATING EXTRA MED SLVR (CLIP) ×3 IMPLANT
CLIP LIGATING EXTRA SM BLUE (MISCELLANEOUS) ×3 IMPLANT
CONNECTOR Y ATS VAC SYSTEM (MISCELLANEOUS) IMPLANT
COVER SURGICAL LIGHT HANDLE (MISCELLANEOUS) ×3 IMPLANT
CUFF TOURN SGL QUICK 24 (TOURNIQUET CUFF)
CUFF TOURN SGL QUICK 34 (TOURNIQUET CUFF)
CUFF TRNQT CYL 24X4X16.5-23 (TOURNIQUET CUFF) IMPLANT
CUFF TRNQT CYL 34X4.125X (TOURNIQUET CUFF) IMPLANT
DRAIN CHANNEL 19F RND (DRAIN) IMPLANT
DRAPE DERMATAC (DRAPES) IMPLANT
DRAPE HALF SHEET 40X57 (DRAPES) ×3 IMPLANT
DRAPE INCISE IOBAN 66X45 STRL (DRAPES) IMPLANT
DRAPE ORTHO SPLIT 77X108 STRL (DRAPES) ×6
DRAPE SURG ORHT 6 SPLT 77X108 (DRAPES) ×6 IMPLANT
DRESSING PREVENA PLUS CUSTOM (GAUZE/BANDAGES/DRESSINGS) IMPLANT
DRSG ADAPTIC 3X8 NADH LF (GAUZE/BANDAGES/DRESSINGS) ×3 IMPLANT
DRSG PREVENA PLUS CUSTOM (GAUZE/BANDAGES/DRESSINGS)
DRSG VAC GRANUFOAM MED (GAUZE/BANDAGES/DRESSINGS) IMPLANT
DRSG VERSA FOAM LRG 10X15 (GAUZE/BANDAGES/DRESSINGS) IMPLANT
ELECT CAUTERY BLADE 6.4 (BLADE) ×3 IMPLANT
ELECT REM PT RETURN 9FT ADLT (ELECTROSURGICAL) ×3
ELECTRODE REM PT RTRN 9FT ADLT (ELECTROSURGICAL) ×3 IMPLANT
EVACUATOR SILICONE 100CC (DRAIN) IMPLANT
FILTER NEPTUNE SMOKE EVACUATOR (MISCELLANEOUS) IMPLANT
GAUZE SPONGE 4X4 12PLY STRL (GAUZE/BANDAGES/DRESSINGS) ×3 IMPLANT
GAUZE XEROFORM 5X9 LF (GAUZE/BANDAGES/DRESSINGS) IMPLANT
GLOVE BIO SURGEON STRL SZ7.5 (GLOVE) ×3 IMPLANT
GOWN STRL REUS W/ TWL LRG LVL3 (GOWN DISPOSABLE) ×6 IMPLANT
GOWN STRL REUS W/ TWL XL LVL3 (GOWN DISPOSABLE) ×3 IMPLANT
GOWN STRL REUS W/TWL LRG LVL3 (GOWN DISPOSABLE) ×6
GOWN STRL REUS W/TWL XL LVL3 (GOWN DISPOSABLE) ×3
KIT BASIN OR (CUSTOM PROCEDURE TRAY) ×3 IMPLANT
KIT TURNOVER KIT B (KITS) ×3 IMPLANT
NS IRRIG 1000ML POUR BTL (IV SOLUTION) ×3 IMPLANT
PACK GENERAL/GYN (CUSTOM PROCEDURE TRAY) ×3 IMPLANT
PAD ARMBOARD 7.5X6 YLW CONV (MISCELLANEOUS) ×6 IMPLANT
PAD NEG PRESSURE SENSATRAC (MISCELLANEOUS) IMPLANT
PENCIL SMOKE EVACUATOR (MISCELLANEOUS) IMPLANT
POWDER SURGICEL 3.0 GRAM (HEMOSTASIS) IMPLANT
PREVENA RESTOR ARTHOFORM 46X30 (CANNISTER) IMPLANT
SET INTERPULSE LAVAGE W/TIP (ORTHOPEDIC DISPOSABLE SUPPLIES) IMPLANT
STAPLER VISISTAT 35W (STAPLE) ×3 IMPLANT
STOCKINETTE IMPERVIOUS LG (DRAPES) ×3 IMPLANT
SUT ETHILON 3 0 PS 1 (SUTURE) IMPLANT
SUT SILK 0 TIES 10X30 (SUTURE) ×3 IMPLANT
SUT SILK 2 0 (SUTURE) ×3
SUT SILK 2-0 18XBRD TIE 12 (SUTURE) ×3 IMPLANT
SUT SILK 3 0 (SUTURE)
SUT SILK 3-0 18XBRD TIE 12 (SUTURE) IMPLANT
SUT VIC AB 2-0 CT1 18 (SUTURE) ×6 IMPLANT
SWAB CULTURE ESWAB REG 1ML (MISCELLANEOUS) IMPLANT
TOWEL GREEN STERILE (TOWEL DISPOSABLE) ×6 IMPLANT
UNDERPAD 30X36 HEAVY ABSORB (UNDERPADS AND DIAPERS) ×3 IMPLANT
WATER STERILE IRR 1000ML POUR (IV SOLUTION) ×3 IMPLANT

## 2022-08-14 NOTE — Op Note (Signed)
Patient name: Heather [name] MRN: [id number] DOB: 04/13/1951 Sex: female  08/14/2022 Pre-operative Diagnosis: ischemic left lower extremity status post revascularization, bilateral nonhealing surgical groin wounds Post-operative diagnosis:  Same Surgeon:  Erlene Quan C. Donzetta Matters, MD Assistant: Laurence Slate, PA Procedure Performed: 1.  Left above-knee amputation 2.  Sharp excisional debridement of bilateral surgical groin wounds right side 7 x 6 x 4 cm left side 10 x 4 x 3 cm with pulse lavage and wound VAC placement   Indications: 71 year old female initially presented with cerebellar hemorrhage and bilateral lower extremity acute limb ischemia has undergone bilateral lower extremity revascularization with stenting of the common iliac arteries with bilateral common femoral endarterectomy and bovine pericardial patch angioplasty and bilateral lower extremity thromboembolectomy.  Unfortunately her left foot remains nonviable she has breakdown of the lateral wound on the left leg from her previous fasciotomy site and she also has bilateral groin wound breakdown with foul smell.  The right lower extremity is viable with strong signals at the posterior tibial artery and she does have good sensation but motor is limited.  She is now indicated for left above-knee amputation and she will also need bilateral groin debridements.  Findings: The above-knee tissue on the left side was healthy and the anterior posterior muscle flaps reapproximated without tension.  The right groin had significant superficial breakdown and underlying turbid fluid which was cultured.  The tissue was all debrided back to healthy tissue unfortunately we did expose the patch on the right common femoral artery but distally this was incorporated more cephalad was not incorporated.  Pulse lavage was performed we are able to get some soft tissue coverage over the patch and then placed a wound VAC with size 7 x 6 x 4 cm.  On the left side the wound  was dehisced from the staples and all of these were removed there was minimal fluid no cultures were sent we were able to debride back to healthy tissue measuring 10 x 4 x 3 cm and the patch was not ever exposed as there was soft tissue coverage over the patch entirely but I could feel the pulse deep in the wound.  After pulse lavage we placed a wound VAC.  Patient will need broad-spectrum antibiotics until cultures return and will need wound VAC change this weekend with more definitive surgical repair likely next week of her bilateral groin wounds.   Procedure:  The patient was identified in the holding area and taken to the operating room where she was placed upon the operative table and general anesthesia was induced.  She was sterilely prepped draped in the bilateral groins and left lower extremity usual fashion, antibiotics were minister timeout was called.  We began with a fishmouth type incision above the knee and dissected down to the level of the bone.  The vessels were all clamped proximally and distally and transected.  The bone was transected with a power saw and a posterior flap was created with amputation knife.  The vessels were all suture-ligated.  The nerve was pulled on tension and tied off with 2-0 Vicryl suture and divided and allowed to retract back up into the wound.  The bone was smoothed with rasp.  We thoroughly irrigated the wound obtaining stasis and then closed the deep fascia with interrupted 2-0 Vicryl and the skin with staples.  Sterile dressing was applied.  Attention was then turned to the left groin where the staples had begun to dehisce.  All staples were removed.  Sharp excisional debridement of the wound was created back to 10 x 4 x 3 cm using a combination of scissors and electrocautery.  There was coverage over the patch in the left groin and we pulse lavaged the left groin 1.5 L and then fashioned a wound VAC with a white sponge deep and a black sponge above this and this  was placed to -125 mmHg suction.  Attention was then turned to the right groin where there was significant soft tissue and skin necrosis.  The staples were all removed.  There was turbid fluid which was sent for culture.  I debrided the wound back to healthy appearing tissue and the patch was exposed deep but the SFA appeared well incorporated distal to this.  I then performed pulse lavage for a total of 1.5 L.  I then obtained stasis of the wound and I was able to close some soft tissue over the patch.  A white sponge was placed followed by black sponge and again connected the wound VAC to -125 mm of suction.  Patient was then awakened from anesthesia having tolerated procedure well.  All counts were correct at completion.  EBL: 50 cc  Specimens:  right groin wound fluid  Providencia Hottenstein C. Donzetta Matters, MD [facility] Office: [phone] Pager: [phone]

## 2022-08-14 NOTE — Progress Notes (Signed)
  Progress Note    08/14/2022 7:17 AM Day of Surgery  Subjective:  no overnight issues  Vitals:   08/14/22 0043 08/14/22 0443  BP: (!) 168/60 (!) 151/61  Pulse: 94 95  Resp: 20 18  Temp:  99.9 F (37.7 C)  SpO2: 100% 94%    Physical Exam: Aaox3 Right foot is warm and sensory in tact, motor in weak Left foot warmer today but still colder than right and minimal sensation without motor function Right groin staples have dehisced with foul smell, left groin staples dehisced but no foul smell or drainage  CBC    Component Value Date/Time   WBC 21.4 (H) 08/14/2022 0120   RBC 3.47 (L) 08/14/2022 0120   HGB 10.6 (L) 08/14/2022 0120   HGB 14.3 06/20/2012 2127   HCT 31.6 (L) 08/14/2022 0120   HCT 42.9 06/20/2012 2127   PLT 378 08/14/2022 0120   PLT 357 06/20/2012 2127   MCV 91.1 08/14/2022 0120   MCV 91 06/20/2012 2127   MCH 30.5 08/14/2022 0120   MCHC 33.5 08/14/2022 0120   RDW 16.6 (H) 08/14/2022 0120   RDW 13.6 06/20/2012 2127   LYMPHSABS 1.8 11/15/2020 0539   MONOABS 0.8 11/15/2020 0539   EOSABS 0.4 11/15/2020 0539   BASOSABS 0.0 11/15/2020 0539    BMET    Component Value Date/Time   NA 139 08/13/2022 0218   NA 135 (L) 06/20/2012 2127   K 4.0 08/13/2022 0218   K 4.0 06/20/2012 2127   CL 107 08/13/2022 0218   CL 100 06/20/2012 2127   CO2 26 08/13/2022 0218   CO2 27 06/20/2012 2127   GLUCOSE 214 (H) 08/13/2022 0218   GLUCOSE 250 (H) 06/20/2012 2127   BUN 39 (H) 08/13/2022 0218   BUN 20 (H) 06/20/2012 2127   CREATININE 0.76 08/13/2022 0218   CREATININE 0.60 06/20/2012 2127   CALCIUM 8.3 (L) 08/13/2022 0218   CALCIUM 8.8 06/20/2012 2127   GFRNONAA >60 08/13/2022 0218   GFRNONAA >60 06/20/2012 2127   GFRAA >60 06/20/2012 2127    INR    Component Value Date/Time   INR 1.0 08/03/2022 2156     Intake/Output Summary (Last 24 hours) at 08/14/2022 0717 Last data filed at 08/13/2022 2045 Gross per 24 hour  Intake 1880.83 ml  Output 800 ml  Net 1080.83  ml     Assessment:  71 y.o. female is s/p: 1.  Bilateral common femoral endarterectomy with bovine pericardial patch angioplasty 2.  Bilateral iliofemoral embolectomy with 4 Fogarty 3.  Bilateral lower extremity embolectomy with long 3 Fogarty and short 4 Fogarty 4.  Aortogram 5.  Stent of bilateral common iliac arteries with 7 x 59 mm VBX 6.  Stent of right external iliac artery with 7 x 100 mm Innova and stent of left external iliac artery with 7 x 60 mm Innova 7.  Bilateral 4 compartment fasciotomies   Plan: OR today for left AKA and will also debride both groins due to wound breakdown   Teiana Hajduk C. Donzetta Matters, MD [facility] [place] [facility] Office: [phone] Pager: [phone]  08/14/2022 7:17 AM

## 2022-08-14 NOTE — Inpatient Diabetes Management (Signed)
Inpatient Diabetes Program Recommendations  AACE/ADA: New Consensus Statement on Inpatient Glycemic Control (2015)  Target Ranges:  Prepandial:   less than 140 mg/dL      Peak postprandial:   less than 180 mg/dL (1-2 hours)      Critically ill patients:  140 - 180 mg/dL   Lab Results  Component Value Date   GLUCAP 211 (H) 08/14/2022   HGBA1C 12.9 (H) 08/05/2022    Review of Glycemic Control  Latest Reference Range & Units 08/13/22 20:06 08/14/22 00:40 08/14/22 04:47 08/14/22 09:04 08/14/22 09:50  Glucose-Capillary 70 - 99 mg/dL 214 (H) 208 (H) 193 (H) 168 (H) 211 (H)  (H): Data is abnormally high Diabetes history: DM2 Outpatient Diabetes medications: Lantus 18 units QHS, Metformin 750 mg daily, Novolog 5 units TID Current orders for Inpatient glycemic control: Semglee 18 units daily, Novolog 7 units Q4H, Novolog 0-20 units Q4H; Pivot @ 60 ml/hr   Inpatient Diabetes Program Recommendations:     Insulin: Noted tube feedings changed to nocturnal feeds (6p-6a); please change Novolog 10 units to TID at 20:00, 00:00, and 4:00.    Thanks, Bronson Curb, MSN, RNC-OB Diabetes Coordinator [phone] (8a-5p)

## 2022-08-14 NOTE — Progress Notes (Signed)
PT Cancellation Note  Patient Details Name: Heather [name] MRN: [id number] DOB: 10/16/1950   Cancelled Treatment:    Reason Eval/Treat Not Completed: Other (comment).  Pt is back from L AKA and B groin wound debridements.  Follow up at another time.   Ramond Dial 08/14/2022, 3:00 PM  Mee Hives, PT PhD Acute Rehab Dept. Number: [place] [facility] [place]

## 2022-08-14 NOTE — Progress Notes (Signed)
Pharmacy Antibiotic Note  Heather [name] is a 71 y.o. female admitted on 08/04/2022 with bilateral lower extremity acute limb ischemia. Patient is now s/p L AKA on 12/21. Scr 0.76 and at baseline. CrCl 57.1 ml/min. WBC 21.4 and trending up in setting of recent surgery. Broad-spectrum antibiotics started post-AKA with plans for likely more definitive surgical repair next week for bilateral groin wounds. Pharmacy consulted to dose vancomycin and Zosyn.   Plan: Vancomycin 1500 mg IV loading dose once on 12/21 Vancomycin 1000 mg IV q24 hours starting 12/22 Zosyn 3.375 mg IV q8 hours Follow cultures for de-escalation Follow future surgical plans Obtain vancomycin levels as indicated  Height: 5' (152.4 cm) Weight: 68 kg (149 lb 14.6 oz) IBW/kg (Calculated) : 45.5  Temp (24hrs), Avg:98.9 F (37.2 C), Min:98.3 F (36.8 C), Max:99.9 F (37.7 C)  Recent Labs  Lab 08/09/22 1554 08/10/22 0325 08/11/22 0357 08/12/22 0420 08/13/22 0218 08/14/22 0120  WBC  --  18.2* 16.3* 19.6* 21.2* 21.4*  CREATININE 0.95 0.88 0.78 0.74 0.76  --     Estimated Creatinine Clearance: 57.1 mL/min (by C-G formula based on SCr of 0.76 mg/dL).    Allergies  Allergen Reactions   Iodinated Contrast Media Hives   Sulfa Antibiotics Hives   Shellfish Allergy Nausea And Vomiting, Rash and Other (See Comments)    Scallops specifically cause VOMITING     Antimicrobials this admission: Cefazolin 12/21 x1 Vancomycin 12/21 >>  Zosyn 12/21 >>  Microbiology results: 12/21 Wound Cx: sent 12/11 MRSA PCR: negative  Thank you for allowing pharmacy to be a part of this patient's care.  Jeneen Rinks, Pharm.D PGY1 Pharmacy Resident 08/14/2022 10:16 AM

## 2022-08-14 NOTE — Anesthesia Procedure Notes (Signed)
Procedure Name: LMA Insertion Date/Time: 08/14/2022 7:37 AM  Performed by: Janace Litten, CRNAPre-anesthesia Checklist: Patient identified, Emergency Drugs available, Suction available and Patient being monitored Patient Re-evaluated:Patient Re-evaluated prior to induction Oxygen Delivery Method: Circle System Utilized Preoxygenation: Pre-oxygenation with 100% oxygen Induction Type: IV induction Ventilation: Mask ventilation without difficulty LMA: LMA inserted LMA Size: 3.0 Number of attempts: 1 Placement Confirmation: positive ETCO2 Tube secured with: Tape Dental Injury: Teeth and Oropharynx as per pre-operative assessment

## 2022-08-14 NOTE — Transfer of Care (Signed)
Immediate Anesthesia Transfer of Care Note  Patient: Heather [name]  Procedure(s) Performed: ABOVE KNEE AMPUTATION (Left: Knee) GROIN DEBRIDEMENT (Bilateral) APPLICATION OF WOUND VAC (Bilateral: Groin)  Patient Location: PACU  Anesthesia Type:General  Level of Consciousness: drowsy, patient cooperative, and responds to stimulation  Airway & Oxygen Therapy: Patient Spontanous Breathing  Post-op Assessment: Report given to RN and Post -op Vital signs reviewed and stable  Post vital signs: Reviewed and stable  Last Vitals:  Vitals Value Taken Time  BP 113/57 08/14/22 0901  Temp    Pulse 79 08/14/22 0903  Resp 13 08/14/22 0903  SpO2 90 % 08/14/22 0903  Vitals shown include unvalidated device data.  Last Pain:  Vitals:   08/14/22 0443  TempSrc: Oral  PainSc:       Patients Stated Pain Goal: 0 (16/10/96 0454)  Complications: No notable events documented.

## 2022-08-14 NOTE — Anesthesia Postprocedure Evaluation (Signed)
Anesthesia Post Note  Patient: Heather [name]  Procedure(s) Performed: ABOVE KNEE AMPUTATION (Left: Knee) GROIN DEBRIDEMENT (Bilateral) APPLICATION OF WOUND VAC (Bilateral: Groin)     Patient location during evaluation: PACU Anesthesia Type: General Level of consciousness: awake and alert Pain management: pain level controlled Vital Signs Assessment: post-procedure vital signs reviewed and stable Respiratory status: spontaneous breathing, nonlabored ventilation, respiratory function stable and patient connected to nasal cannula oxygen Cardiovascular status: stable and blood pressure returned to baseline Postop Assessment: no apparent nausea or vomiting Anesthetic complications: no   No notable events documented.  Last Vitals:  Vitals:   08/14/22 0930 08/14/22 0947  BP: (!) 104/56 (!) 113/55  Pulse: 77 78  Resp: 11 12  Temp: 36.9 C 36.8 C  SpO2: 96% 96%                    Audry Pili

## 2022-08-14 NOTE — Progress Notes (Signed)
Subj: 71 year old female with PMH of DM with neuropathy, HTN, HLD, COPD, GERD, PAD, s/p TAVR, chronic diastolic CHF, anxiety/depression/schizophrenia, tobacco abuse, presented to ER with sudden onset of dizziness, blurred vision, headache, nausea and vomiting. BP in EMS 209/76.  She was admitted to ICU for hemorrhagic stroke and bilateral lower extremity acute on chronic ischemia.  Vascular surgery consulted and s/p bilateral lower extremity femoral endarterectomy and patch angioplasty with iliac stenting, 4 compartment fasciotomies for acute on chronic lower extremity ischemia.  Postop remained on vent and Cleviprex.  Transferred to floor and TRH on 12/17.  Neurology and vascular surgery following.  Vascular surgery plans left lower extremity amputation on 12/21.      Obj: 12/20 A/O x 4, positive 7/10 RLE pain.       Objective: VITAL SIGNS: Temp: 99.1 F (37.3 C) (12/20 1247) Temp Source: Oral (12/20 1247) BP: 136/58 (12/20 1247) Pulse Rate: 96 (12/20 1247) SPO2; FIO2:     Intake/Output Summary (Last 24 hours) at 08/13/2022 1348 Last data filed at 08/13/2022 1247    Gross per 24 hour  Intake 2157.94 ml  Output 2200 ml  Net -42.06 ml        Exam: Physical Exam: -Attempted to see patient twice still in OR.  No charge   Mobility Assessment (last 72 hours)       Mobility Assessment       Row Name 08/12/22 2000 08/12/22 1255 08/12/22 1100 08/11/22 2044 08/11/22 0736    Does patient have an order for bedrest or is patient medically unstable No - Continue assessment -- -- No - Continue assessment Yes- Bedfast (Level 1) - Complete    What is the highest level of mobility based on the progressive mobility assessment? Level 2 (Chairfast) - Balance while sitting on edge of bed and cannot stand Level 2 (Chairfast) - Balance while sitting on edge of bed and cannot stand Level 2 (Chairfast) - Balance while sitting on edge of bed and cannot stand Level 1 (Bedfast) - Unable to balance while  sitting on edge of bed Level 1 (Bedfast) - Unable to balance while sitting on edge of bed    Is the above level different from baseline mobility prior to current illness? Yes - Recommend PT order -- -- Yes - Recommend PT order Yes - Recommend PT order                      DVT prophylaxis: Subcu heparin Code Status: Full Family Communication:  Status is: Inpatient       Dispo: The patient is from: Home              Anticipated d/c is to: Home              Anticipated d/c date is: > 3 days              Patient currently is not medically stable to d/c.       Procedures/Significant Events: 12/11 Ehocardiogram  Left Ventricle: Left ventricular ejection fraction, by estimation, is 60  to 65%. The left ventricle has normal function. The left ventricle has no  regional wall motion abnormalities. The left ventricular internal cavity  size was normal in size.  Suboptimal image quality limits for assessment of left ventricular  hypertrophy. Left ventricular diastolic parameters are indeterminate.   Right Ventricle: The right ventricular size is normal. Right vetricular  wall thickness was not well visualized. Right ventricular systolic  function is normal.  Mitral Valve: The mitral valve is abnormal. There is moderate thickening  of the mitral valve leaflet(s). There is moderate calcification of the  mitral valve leaflet(s). Moderate mitral annular calcification. Trivial  mitral valve regurgitation. Mild  mitral valve stenosis. MV peak gradient, 18.6 mmHg. The mean mitral valve  gradient is 6.5 mmHg with average heart rate of 95 bpm.   Aortic Valve: The aortic valve has been repaired/replaced. Aortic valve  regurgitation is trivial. Aortic valve mean gradient measures 9.2 mmHg.  Aortic valve peak gradient measures 17.8 mmHg. Aortic valve area, by VTI  measures 2.35 cm. There is a 23 mm  Sapien prosthetic, stented (TAVR) valve present in the aortic position.  Procedure Date:  09/2016. Echo findings are consistent with normal structure  and function of the aortic valve prosthesis.      Consultants:  Vascular surgery     Cultures     Antimicrobials: Anti-infectives (From admission, onward)    Start     Dose/Rate Route Frequency Ordered Stop   08/15/22 1100  vancomycin (VANCOCIN) IVPB 1000 mg/200 mL premix        1,000 mg 200 mL/hr over 60 Minutes Intravenous Every 24 hours 08/14/22 1006     08/14/22 1400  piperacillin-tazobactam (ZOSYN) IVPB 3.375 g        3.375 g 12.5 mL/hr over 240 Minutes Intravenous Every 8 hours 08/14/22 1006     08/14/22 1100  vancomycin (VANCOREADY) IVPB 1500 mg/300 mL        1,500 mg 150 mL/hr over 120 Minutes Intravenous  Once 08/14/22 1006 08/14/22 1502   08/04/22 1045  ceFAZolin (ANCEF) IVPB 2g/100 mL premix        2 g 200 mL/hr over 30 Minutes Intravenous On call to O.R. 08/04/22 0958 08/04/22 1100            Assessment & Plan: Covid vaccination;   Principal Problem:   ICH (intracerebral hemorrhage) ([place])   Bilateral lower extremity acute on chronic ischemia: - s/p bilateral lower extremity femoral endarterectomy and patch angioplasty with iliac stenting, 4 compartment fasciotomiesPost op care per vasc surgery  - ck decreasing, 6462 on 12/16, down to 3379 on 12/19. -Vascular surgery follow-up appreciated.  LLE with no palpable or dopplerable pulses, cool foot to touch with mottling of the toes, reportedly worse than before.   - Anticipating she will get an AKA on 12/21 as discussed today with Dr. Donzetta Matters, VVS. -IV heparin discontinued 12/16 and now on aspirin 81 Mg daily and Plavix 75 Mg daily.  See discussion below regarding antiplatelets. -Discussed with Dr. Leonie Man, neurology on 12/19.  From a stroke perspective, patient only needs a single antiplatelet agent.  If vascular surgery wishes to continue Plavix then he recommends discontinuing aspirin.  Communicated with Dr. Donzetta Matters, [place] to continue Plavix. ASA DC'ed. -12/20  vascular surgery plans LEFT AKA on 12/21 -12/21 patient remains in OR for LEFT AKA   Acute cerebellar vermis intraparenchymal hemorrhage  Management per neurology/stroke team. Code stroke CT: Acute intraparenchymal hematoma within the cerebellar vermis measuring 11 cc in volume and mild mass effect upon the fourth ventricle. CTA head and neck: Negative for large vessel occlusion.  70% stenosis of innominate artery origin. Follow-up CT head 12/12: Stable intraparenchymal hematoma within the cerebellar vermis.  Stable moderate mass effect upon the fourth ventricle. MRI brain stable left cerebellar ICH with extension to fourth ventricle.  No hydrocephalus. Repeat CT head 12/14: Stable hematoma and no hydrocephalus. TTE: LVEF 60-65%, LDL 34, A1c 12.9.  Was on aspirin 81 Mg daily + Plavix 75 Mg daily PTA, then here on IV heparin per stroke protocol which was switched to aspirin 81 Mg daily on 12/17.  Remains on Plavix 75 Mg daily as well. As per therapy recommendations, SNF when medically optimized. -Neurology signed off 12/18.   -Please see above regarding discussion with Dr. Leonie Man on 12/19 regarding antiplatelets. -12/20 negative neurological deficit   Acute respiratory failure with hypoxia Secondary to acute cerebellar hemorrhagic stroke and postop Extubated 12/14. -12/20 on room air   Hx of depression, bipolar, schizophrenia, insomnia. - trazodone at night    HTN emergency. -Coreg 12.5 mg BID -Hydralazine PRN -Labetalol PRN -12/20 BP currently controlled   Hx of CAD, chronic HFpEF, HLD, HTN, s/p TAVR. - goal SBP <160 -Antiplatelet management as above.  Cozaar being held due to AKI. - holding lipitor in setting of elevated CK  - Got a couple doses of IV Lasix in ICU. -12/20 echocardiogram does not support diagnosis of HFpEF -Troponins never trended.  Patient currently without CP   Hx of COPD with tobacco abuse. -triple therapy nebs  -No clinical exacerbation at this time.    AKI from rhabdomyolysis. -Trend CK   Latest Reference Range & Units 08/08/22 05:09 08/09/22 07:42 08/11/22 03:57 08/12/22 04:20 08/13/22 02:18  CK Total 38 - 234 U/L 15,580 (H) 6,462 (H) 3,982 (H) 3,379 (H) 2,499 (H)  (H): Data is abnormally high   Non-gap metabolic acidosis. -AKI and metabolic acidosis resolved -Resolved.   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 -12/12 hemoglobin A1c= 12.9 -Semglee 18 units daily - NovoLog 7 units q 4 hrs - Resistant SSI CBG (last 3)  Recent Labs    08/14/22 0950 08/14/22 1659 08/14/22 2051  GLUCAP 211* 128* 228*      DM neuropathy. -See DM type II   Anemia of critical illness. - f/u CBC - transfuse for hgb <7 -Hemoglobin has dropped from 8.5-7.9 >7.6>7.7 in the absence of overt bleeding.  Follow CBC in a.m. and transfuse if hemoglobin 7 g or less. -12/20 transfuse 2 unit PRBC -   Dysphagia: Likely related to hemorrhagic stroke Has left nasal core track with ongoing tube feeds. Failed swallow evaluation on 12/16. As per SLP follow-up, completed FEES and recommended dysphagia to 2 diet with thin liquids on 12/18. -12/20 positive anorexia    Nutritional Status Nutrition Problem: Increased nutrient needs Etiology: wound healing Signs/Symptoms: estimated needs Interventions: Tube feeding, MVI     Pressure Injury 08/04/22 Sacrum Stage 2 -  Partial thickness loss of dermis presenting as a shallow open injury with a red, pink wound bed without slough. (Active)  08/04/22 0100  Location: Sacrum  Location Orientation:   Staging: Stage 2 -  Partial thickness loss of dermis presenting as a shallow open injury with a red, pink wound bed without slough.  Wound Description (Comments):   Present on Admission: Yes  Dressing Type Foam - Lift dressing to assess site every shift 08/13/22 0940            Mobility Assessment (last 72 hours)       Mobility Assessment       Row Name 08/12/22 2000 08/12/22 1255 08/12/22 1100  08/11/22 2044 08/11/22 0736    Does patient have an order for bedrest or is patient medically unstable No - Continue assessment -- -- No - Continue assessment Yes- Bedfast (Level 1) - Complete    What is the highest level of mobility based on the  progressive mobility assessment? Level 2 (Chairfast) - Balance while sitting on edge of bed and cannot stand Level 2 (Chairfast) - Balance while sitting on edge of bed and cannot stand Level 2 (Chairfast) - Balance while sitting on edge of bed and cannot stand Level 1 (Bedfast) - Unable to balance while sitting on edge of bed Level 1 (Bedfast) - Unable to balance while sitting on edge of bed    Is the above level different from baseline mobility prior to current illness? Yes - Recommend PT order -- -- Yes - Recommend PT order Yes - Recommend PT order                  Time: 0, patient remains in OR no charge

## 2022-08-15 ENCOUNTER — Encounter (HOSPITAL_COMMUNITY): Payer: Self-pay | Admitting: Vascular Surgery

## 2022-08-15 DIAGNOSIS — K612 Anorectal abscess: Secondary | ICD-10-CM

## 2022-08-15 DIAGNOSIS — L89152 Pressure ulcer of sacral region, stage 2: Secondary | ICD-10-CM | POA: Diagnosis present

## 2022-08-15 DIAGNOSIS — I614 Nontraumatic intracerebral hemorrhage in cerebellum: Secondary | ICD-10-CM | POA: Diagnosis not present

## 2022-08-15 LAB — GLUCOSE, CAPILLARY
Glucose-Capillary: 182 mg/dL — ABNORMAL HIGH (ref 70–99)
Glucose-Capillary: 224 mg/dL — ABNORMAL HIGH (ref 70–99)
Glucose-Capillary: 224 mg/dL — ABNORMAL HIGH (ref 70–99)
Glucose-Capillary: 248 mg/dL — ABNORMAL HIGH (ref 70–99)
Glucose-Capillary: 309 mg/dL — ABNORMAL HIGH (ref 70–99)
Glucose-Capillary: 74 mg/dL (ref 70–99)

## 2022-08-15 LAB — SURGICAL PATHOLOGY, GROSS ONLY (NOT ARMC)

## 2022-08-15 NOTE — Progress Notes (Signed)
Speech Language Pathology Treatment: Dysphagia  Patient Details Name: Heather [name] MRN: [id number] DOB: 07-11-1951 Today's Date: 08/15/2022 Time: 0900-0920 SLP Time Calculation (min) (ACUTE ONLY): 20 min  Assessment / Plan / Recommendation Clinical Impression  Pt continues to have cortrak, oral intake is minimal. When asked why she isn't eating pt says the food isn't appetizing. She has been on a ground texture due to no dentition with decreased ability to masticate and produce saliva resulting in fairly dry solid boluses sticking in valleculae on FEES. Pt says she would eat more if the texture were regular and SLP asked if she could try a graham cracker with her gums. "Sure," she says and also requests peanut butter. SLP offered this and pt masticated bolus for about 5 minutes eventually needed some applesauce and several sips of water fed to her (she would not take them herself to facilitate bolus transit with verbal cues). Magic cup arrived during  SLP visit and pt said she didn't like them. SLP able to feed her a few bites of it, but she didn't want more and said she probably would not feed it to herself. Despite restriction on milk (which could be lifted if it would improve quality of oral intake as aspiration risk is mild but risk of malnutrition higher) SLP offered an Ensure, which she also refused.   Ultimately, though pt would love to eat a regular texture, she does not appear capable. This is not an impairment that would resolve with therapy. 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 She is likely to need a modified solid texture into the future. For now, will attempt a mechanical soft which may make the food more palatable, but may also lead to increased effort and mastication time. Will f/u for tolerance though benefit of ongoing therapy is minimal. Will f/u only to determine if food texture is acceptable. MD will need to determine ability to d/c cortrak depending on nutrition.    HPI HPI: 71 yo  female smoker presented to ER with sudden onset of dizziness, blurred vision, headache, nausea and vomiting.  BP in EMS 209/76.  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  Also found to have bilateral leg pain with numbness and coolness.  Found to have occlusion of b/l common iliac artery stents, Lt SFA and popliteal stent and vascular surgery consulted.  Taken to OR for b/l common femoral endarterectomy, iliofemoral endarterectomy, lower extremity embolectomy and stent of b/l common iliac arteries and Rt external iliac artery, and b/l 4 compartment fasciotomies.  Remained on vent and cleviprex post op. 12/11-12/14.      SLP Plan         Recommendations for follow up therapy are one component of a multi-disciplinary discharge planning process, led by the attending physician.  Recommendations may be updated based on patient status, additional functional criteria and insurance authorization.    Recommendations  Diet recommendations: Dysphagia 3 (mechanical soft);Thin liquid Liquids provided via: Cup;Straw Medication Administration: Crushed with puree Supervision: Patient able to self feed Compensations: Slow rate;Small sips/bites Postural Changes and/or Swallow Maneuvers: Seated upright 90 degrees                            Heather [name], Heather [name]  08/15/2022, 9:34 AM

## 2022-08-15 NOTE — Progress Notes (Signed)
Occupational Therapy Treatment Patient Details Name: Heather [name] MRN: [id number] DOB: 09-04-1950 Today's Date: 08/15/2022   History of present illness Ms. Arliene Rosenow is a 71 y.o. 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  Found to have mottled LE's after given Narcdipine due to hypertention and went for emergent common femoral and iliofemoral endarterectomy and bilateral 4 compartment fasciotomies on 08/04/22.   OT comments  Patient received in supine and agreeable to OT treatment and declined getting to EOB due to complaints of pain. Patient performed rolling in bed and required max assist due to pain with movement. Patient able to follow directions for UE AROM with frequent rest breaks. Patient to continue to be followed by acute OT to continue to address OOB self care tasks.    Recommendations for follow up therapy are one component of a multi-disciplinary discharge planning process, led by the attending physician.  Recommendations may be updated based on patient status, additional functional criteria and insurance authorization.    Follow Up Recommendations  Skilled nursing-short term rehab (<3 hours/day)     Assistance Recommended at Discharge Frequent or constant Supervision/Assistance  Patient can return home with the following  Two people to help with walking and/or transfers;A lot of help with bathing/dressing/bathroom;Assistance with cooking/housework;Assistance with feeding;Direct supervision/assist for medications management;Direct supervision/assist for financial management;Assist for transportation;Help with stairs or ramp for entrance   Equipment Recommendations  None recommended by OT (defer to next venue)    Recommendations for Other Services       Precautions / Restrictions Precautions Precautions: Fall Precaution Comments: pain L LE>R with movement       Mobility Bed Mobility Overal bed mobility: Needs Assistance Bed Mobility: Rolling Rolling: Max assist         General bed mobility comments: rolling performed in bed to assist with cleaning with max assist and assistance to maintain sidelying    Transfers Overall transfer level: Needs assistance                 General transfer comment: defered due to pain     Balance Overall balance assessment: Needs assistance                                         ADL either performed or assessed with clinical judgement   ADL Overall ADL's : Needs assistance/impaired     Grooming: Wash/dry hands;Wash/dry face;Supervision/safety;Bed level       Lower Body Bathing: Maximal assistance;Bed level Lower Body Bathing Details (indicate cue type and reason): patient was assisted with cleaning with rolling in bed                       General ADL Comments: limited by pain    Extremity/Trunk Assessment              Vision       Perception     Praxis      Cognition Arousal/Alertness: Awake/alert Behavior During Therapy: Flat affect Overall Cognitive Status: Impaired/Different from baseline Area of Impairment: Attention, Memory, Following commands, Safety/judgement, Awareness, Problem solving, Orientation  Orientation Level: Situation   Memory: Decreased short-term memory Following Commands: Follows one step commands with increased time, Follows one step commands inconsistently Safety/Judgement: Decreased awareness of safety, Decreased awareness of deficits Awareness: Emergent Problem Solving: Requires verbal cues, Requires tactile cues, Slow processing, Decreased initiation, Difficulty sequencing General Comments: patient willing to participate but was limited due to pain        Exercises  Exercises: General Upper Extremity General Exercises - Upper Extremity Shoulder Flexion: AROM, 5 reps, Both, Supine Shoulder ABduction: AROM, Both, 5 reps, Supine Elbow Flexion: AROM, Both, 10 reps, Supine Elbow Extension: AROM, Both, 10 reps, Supine    Shoulder Instructions       General Comments      Pertinent Vitals/ Pain       Pain Assessment Pain Assessment: Faces Faces Pain Scale: Hurts whole lot Pain Location: generalized with movement; bed mobility Pain Descriptors / Indicators: Aching, Discomfort, Grimacing, Guarding Pain Intervention(s): Limited activity within patient's tolerance, Monitored during session, Patient requesting pain meds-RN notified  Home Living                                          Prior Functioning/Environment              Frequency  Min 2X/week        Progress Toward Goals  OT Goals(current goals can now be found in the care plan section)  Progress towards OT goals: Progressing toward goals  Acute Rehab OT Goals Patient Stated Goal: get better OT Goal Formulation: With patient Time For Goal Achievement: 08/22/22 Potential to Achieve Goals: Good ADL Goals Pt Will Perform Lower Body Bathing: with min assist;sit to/from stand;sitting/lateral leans Pt Will Perform Lower Body Dressing: with min assist;sitting/lateral leans;sit to/from stand Pt Will Transfer to Toilet: with min assist;with +2 assist;bedside commode;stand pivot transfer Pt Will Perform Toileting - Clothing Manipulation and hygiene: with min assist;sitting/lateral leans;sit to/from stand Additional ADL Goal #1: Patient will be able to sit EOB without need for external assist as a precursor to OOB activites.  Plan Discharge plan remains appropriate    Co-evaluation                 AM-PAC OT "6 Clicks" Daily Activity     Outcome Measure   Help from another person eating meals?: A Little Help from another person taking care of personal  grooming?: A Little Help from another person toileting, which includes using toliet, bedpan, or urinal?: A Lot Help from another person bathing (including washing, rinsing, drying)?: A Lot Help from another person to put on and taking off regular upper body clothing?: A Lot Help from another person to put on and taking off regular lower body clothing?: Total 6 Click Score: 13    End of Session    OT Visit Diagnosis: Unsteadiness on feet (R26.81);Other abnormalities of gait and mobility (R26.89);Muscle weakness (generalized) (M62.81);Pain Pain - Right/Left: Left Pain - part of body: Leg   Activity Tolerance Patient limited by pain   Patient Left in bed;with call bell/phone within reach;with bed alarm set   Nurse Communication Mobility status;Patient requests pain meds        Time: 4332-9518 OT Time Calculation (min): 23 min  Charges: OT General Charges $OT Visit: 1 Visit OT Treatments $Therapeutic Activity: 8-22 mins $Therapeutic Exercise: 8-22 mins  Lodema Hong, [place]  Office [phone]  Yasir Kitner Alexis Goodell 08/15/2022, 12:14 PM

## 2022-08-15 NOTE — Progress Notes (Signed)
PT Cancellation Note  Patient Details Name: Heather [name] MRN: [id number] DOB: [birth year]   Cancelled Treatment:    Reason Eval/Treat Not Completed: Fatigue/lethargy limiting ability to participate. Pt with significant pain earlier today, now lethargic (RN reports significant premedication) and unable to participate in therapy. Will follow up as schedule permits.  Mabeline Caras, PT, DPT Acute Rehabilitation Services  Personal: [facility] Office: [place] 08/15/2022, 1:02 PM

## 2022-08-15 NOTE — Inpatient Diabetes Management (Signed)
Inpatient Diabetes Program Recommendations  AACE/ADA: New Consensus Statement on Inpatient Glycemic Control (2015)  Target Ranges:  Prepandial:   less than 140 mg/dL      Peak postprandial:   less than 180 mg/dL (1-2 hours)      Critically ill patients:  140 - 180 mg/dL   Lab Results  Component Value Date   GLUCAP 224 (H) 08/15/2022   HGBA1C 12.9 (H) 08/05/2022    Review of Glycemic Control  Latest Reference Range & Units 08/14/22 20:51 08/15/22 00:06 08/15/22 04:28 08/15/22 08:28 08/15/22 12:05  Glucose-Capillary 70 - 99 mg/dL 228 (H) 224 (H) 309 (H) 248 (H) 224 (H)  (H): Data is abnormally high Diabetes history: DM2 Outpatient Diabetes medications: Lantus 18 units QHS, Metformin 750 mg daily, Novolog 5 units TID Current orders for Inpatient glycemic control: Semglee 18 units daily, Novolog 7 units Q4H, Novolog 0-20 units Q4H; Pivot @ 60 ml/hr   Inpatient Diabetes Program Recommendations:    Concern for possible admin of tube feed coverage with current Q4H order and tube feeds being off. Patient did receive tube feed coverage at 1200 while tube feeds had been stopped, thus increasing chances of lows.   Consider increasing tube feed coverage to Novolog 14 units for specific times: 2000, 0000, 0400, 0800.   Thanks,   Bronson Curb, MSN, RNC-OB Diabetes Coordinator [phone] (8a-5p)

## 2022-08-15 NOTE — Progress Notes (Signed)
Heather [name] AYT:[id number] DOB: Dec 03, 1950 DOA: 08/04/2022 PCP: [name], FNP   Subj: 71 year old female with PMH of DM with neuropathy, HTN, HLD, COPD, GERD, PAD, s/p TAVR, chronic diastolic CHF, anxiety/depression/schizophrenia, tobacco abuse, presented to ER with sudden onset of dizziness, blurred vision, headache, nausea and vomiting. BP in EMS 209/76.  She was admitted to ICU for hemorrhagic stroke and bilateral lower extremity acute on chronic ischemia.  Vascular surgery consulted and s/p bilateral lower extremity femoral endarterectomy and patch angioplasty with iliac stenting, 4 compartment fasciotomies for acute on chronic lower extremity ischemia.  Postop remained on vent and Cleviprex.  Transferred to floor and TRH on 12/17.  Neurology and vascular surgery following.  Vascular surgery plans left lower extremity amputation on 12/21.    Obj:  12/22 A/O x 4, sitting comfortably in bed talking on the phone and eating her meal.   Objective: VITAL SIGNS: Temp: 98.5 F (36.9 C) (12/22 1652) Temp Source: Oral (12/22 1652) BP: 128/53 (12/22 1210) Pulse Rate: 88 (12/22 1652) SPO2; FIO2:   Intake/Output Summary (Last 24 hours) at 08/15/2022 1952 Last data filed at 08/15/2022 0800 Gross per 24 hour  Intake 2061.87 ml  Output 1425 ml  Net 636.87 ml    . Exam: Physical Exam:  General: A/O x 4 No acute respiratory distress Eyes: negative scleral hemorrhage, negative anisocoria, negative icterus ENT: Negative Runny nose, negative gingival bleeding, Neck:  Negative scars, masses, torticollis, lymphadenopathy, JVD Lungs: Clear to auscultation bilaterally without wheezes or crackles Cardiovascular: Regular rate and rhythm without murmur gallop or rub normal S1 and S2 Abdomen: negative abdominal pain, nondistended, positive soft, bowel sounds, no rebound, no ascites, no appreciable mass Extremities: positive pain to palpation RIGHT lower extremity, positive LEFT AKA, wound VAC  in place serosanguineous fluid Skin: Negative rashes, lesions, ulcers Psychiatric:  Negative depression, negative anxiety, negative fatigue, negative mania  Central nervous system:  Cranial nerves II through XII intact, negative dysarthria, negative expressive aphasia, negative receptive aphasia.    Mobility Assessment (last 72 hours)     Mobility Assessment     Row Name 08/15/22 1200 08/14/22 1000 08/12/22 2000       Does patient have an order for bedrest or is patient medically unstable -- No - Continue assessment No - Continue assessment     What is the highest level of mobility based on the progressive mobility assessment? Level 1 (Bedfast) - Unable to balance while sitting on edge of bed Level 1 (Bedfast) - Unable to balance while sitting on edge of bed Level 2 (Chairfast) - Balance while sitting on edge of bed and cannot stand     Is the above level different from baseline mobility prior to current illness? -- Yes - Recommend PT order Yes - Recommend PT order                DVT prophylaxis: Subcu heparin Code Status: Full Family Communication:  Status is: Inpatient    Dispo: The patient is from: Home              Anticipated d/c is to: Home              Anticipated d/c date is: > 3 days              Patient currently is not medically stable to d/c.    Procedures/Significant Events: 12/11 Ehocardiogram  Left Ventricle: Left ventricular ejection fraction, by estimation, is 60  to 65%. The left ventricle has normal function. The  left ventricle has no  regional wall motion abnormalities. The left ventricular internal cavity  size was normal in size.  Suboptimal image quality limits for assessment of left ventricular  hypertrophy. Left ventricular diastolic parameters are indeterminate.   Right Ventricle: The right ventricular size is normal. Right vetricular  wall thickness was not well visualized. Right ventricular systolic  function is normal.    Mitral Valve:  The mitral valve is abnormal. There is moderate thickening  of the mitral valve leaflet(s). There is moderate calcification of the  mitral valve leaflet(s). Moderate mitral annular calcification. Trivial  mitral valve regurgitation. Mild  mitral valve stenosis. MV peak gradient, 18.6 mmHg. The mean mitral valve  gradient is 6.5 mmHg with average heart rate of 95 bpm.   Tricuspid Valve: The tricuspid valve is grossly normal. Tricuspid valve  regurgitation is trivial. No evidence of tricuspid stenosis.   Aortic Valve: The aortic valve has been repaired/replaced. Aortic valve  regurgitation is trivial. Aortic valve mean gradient measures 9.2 mmHg.  Aortic valve peak gradient measures 17.8 mmHg. Aortic valve area, by VTI  measures 2.35 cm. There is a 23 mm  Sapien prosthetic, stented (TAVR) valve present in the aortic position.  Procedure Date: 09/2016. Echo findings are consistent with normal structure  and function of the aortic valve prosthesis.  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 Servando Snare   Cultures 12/21 Groin positive moderate GNR/rare Proteus Mirabilis   Antimicrobials: Anti-infectives (From admission, onward)    Start     Ordered Stop   08/15/22 1100  vancomycin (VANCOCIN) IVPB 1000 mg/200 mL premix  Status:  Discontinued        08/14/22 1006 08/15/22 1307   08/14/22 1400  piperacillin-tazobactam (ZOSYN) IVPB 3.375 g        08/14/22 1006     08/14/22 1100  vancomycin (VANCOREADY) IVPB 1500 mg/300 mL        08/14/22 1006 08/14/22 1502   08/04/22 1045  ceFAZolin (ANCEF) IVPB 2g/100 mL premix        08/04/22 0958 08/04/22 1100         Assessment & Plan: Covid vaccination;   Principal Problem:   ICH (intracerebral hemorrhage) ([place]) Active Problems:   Abscess of anal  and rectal regions   Decubitus ulcer of sacral region, stage 2 ([place])  Bilateral lower extremity acute on chronic ischemia: - s/p bilateral lower extremity femoral endarterectomy and patch angioplasty with iliac stenting, 4 compartment fasciotomiesPost op care per vasc surgery  - ck decreasing, 6462 on 12/16, down to 3379 on 12/19. -Vascular surgery follow-up appreciated.  LLE with no palpable or dopplerable pulses, cool foot to touch with mottling of the toes, reportedly worse than before.   - Anticipating she will get an AKA on 12/21 as discussed today with Dr. Donzetta Matters, [facility]. -IV heparin discontinued 12/16 and now on aspirin 81 Mg daily and Plavix 75 Mg daily.  See discussion below regarding antiplatelets. -Discussed with Dr. Leonie Man, neurology on 12/19.  From a stroke perspective, patient only needs a single antiplatelet agent.  If vascular surgery wishes to continue Plavix then he recommends discontinuing aspirin.  Communicated with Dr. Donzetta Matters, [place] to continue Plavix. ASA DC'ed. -12/20 vascular surgery plans LEFT AKA on 12/21  Groin abscess positive moderate  GNR/rare Proteus Mirabilis - 12/22 DC vancomycin, continue Zosyn   Acute cerebellar vermis intraparenchymal hemorrhage  Management per neurology/stroke team. Code stroke CT: Acute intraparenchymal hematoma within the cerebellar vermis measuring 11 cc in volume and mild mass effect upon the fourth ventricle. CTA head and neck: Negative for large vessel occlusion.  70% stenosis of innominate artery origin. Follow-up CT head 12/12: Stable intraparenchymal hematoma within the cerebellar vermis.  Stable moderate mass effect upon the fourth ventricle. MRI brain stable left cerebellar ICH with extension to fourth ventricle.  No hydrocephalus. Repeat CT head 12/14: Stable hematoma and no hydrocephalus. TTE: LVEF 60-65%, LDL 34, A1c 12.9. Was on aspirin 81 Mg daily + Plavix 75 Mg daily PTA, then here on IV heparin per stroke protocol which was switched  to aspirin 81 Mg daily on 12/17.  Remains on Plavix 75 Mg daily as well. As per therapy recommendations, SNF when medically optimized. -Neurology signed off 12/18.   -Please see above regarding discussion with Dr. Leonie Man on 12/19 regarding antiplatelets. -12/20 negative neurological deficit   Acute respiratory failure with hypoxia Secondary to acute cerebellar hemorrhagic stroke and postop Extubated 12/14. -12/20 on room air   Hx of depression, bipolar, schizophrenia, insomnia. - trazodone at night    HTN emergency. -Coreg 12.5 mg BID -Hydralazine PRN -Labetalol PRN -12/20 BP currently controlled  Hx of CAD, chronic HFpEF, HLD, HTN, s/p TAVR. - goal SBP <160 -Antiplatelet management as above.  Cozaar being held due to AKI. - holding lipitor in setting of elevated CK  - Got a couple doses of IV Lasix in ICU. -12/20 echocardiogram does not support diagnosis of HFpEF -Troponins never trended.  Patient currently without CP   Hx of COPD with tobacco abuse. -triple therapy nebs  -No clinical exacerbation at this time.   AKI from rhabdomyolysis. -Trend CK  Latest Reference Range & Units 08/08/22 05:09 08/09/22 07:42 08/11/22 03:57 08/12/22 04:20 08/13/22 02:18  CK Total 38 - 234 U/L 15,580 (H) 6,462 (H) 3,982 (H) 3,379 (H) 2,499 (H)  (H): Data is abnormally high -12/22 increase LR 081KG/YJ  Non-gap metabolic acidosis. -AKI and metabolic acidosis resolved -Resolved.   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 -12/12 hemoglobin A1c= 12.9 -Semglee 18 units daily - NovoLog 7 units q 4 hrs - Resistant SSI CBG (last 3)  Recent Labs    08/15/22 0828 08/15/22 1205 08/15/22 1653  GLUCAP 248* 224* 74     DM neuropathy. -See DM type II   Anemia of critical illness. - f/u CBC - transfuse for hgb <7 -Hemoglobin has dropped from 8.5-7.9 >7.6>7.7 in the absence of overt bleeding.  Follow CBC in a.m. and transfuse if hemoglobin 7 g or less. -12/20 transfuse 2 unit  PRBC Lab Results  Component Value Date   HGB 10.6 (L) 08/14/2022   HGB 9.0 (L) 08/13/2022   HGB 6.8 (LL) 08/13/2022   HGB 7.7 (L) 08/12/2022   HGB 7.6 (L) 08/11/2022     Dysphagia: Likely related to hemorrhagic stroke Has left nasal core track with ongoing tube feeds. Failed swallow evaluation on 12/16. As per SLP follow-up, completed FEES and recommended dysphagia to 2 diet with thin liquids on 12/18. -12/20 positive anorexia    Nutritional Status Nutrition Problem: Increased nutrient needs Etiology: wound healing Signs/Symptoms: estimated needs Interventions: Tube feeding, MVI  Stage II sacral decubitus ulcer Pressure Injury 08/04/22 Sacrum Stage 2 -  Partial thickness loss of dermis presenting as a shallow open injury with a red, pink wound  bed without slough. (Active)  08/04/22 0100  Location: Sacrum  Location Orientation:   Staging: Stage 2 -  Partial thickness loss of dermis presenting as a shallow open injury with a red, pink wound bed without slough.  Wound Description (Comments):   Present on Admission: Yes  Dressing Type Foam - Lift dressing to assess site every shift 08/15/22 1156        Mobility Assessment (last 72 hours)     Mobility Assessment     Row Name 08/15/22 1200 08/14/22 1000 08/12/22 2000       Does patient have an order for bedrest or is patient medically unstable -- No - Continue assessment No - Continue assessment     What is the highest level of mobility based on the progressive mobility assessment? Level 1 (Bedfast) - Unable to balance while sitting on edge of bed Level 1 (Bedfast) - Unable to balance while sitting on edge of bed Level 2 (Chairfast) - Balance while sitting on edge of bed and cannot stand     Is the above level different from baseline mobility prior to current illness? -- Yes - Recommend PT order Yes - Recommend PT order                       Care during the described time interval was provided by me .  I have  reviewed this patient's available data, including medical history, events of note, physical examination, and all test results as part of my evaluation.

## 2022-08-15 NOTE — Progress Notes (Signed)
Patient output from negative pressure therapy, 7pm ending 7am was 50 mls.

## 2022-08-15 NOTE — TOC Progression Note (Signed)
Transition of Care [facility]) - Progression Note    Patient Details  Name: Heather [name] MRN: [id number] Date of Birth: June 05, 1951  Transition of Care [facility]) CM/SW [place], [place] Phone Number: 08/15/2022, 5:10 PM  Clinical Narrative:     TOC continues to follow   Expected Discharge Plan: [place] Barriers to Discharge: [place] (PASRR), Insurance Authorization, Continued Medical Work up, SNF Pending bed offer (cortrak)  Expected Discharge Plan and Services In-house Referral: Clinical Social Work     Living arrangements for the past 2 months: [place]                                       Social Determinants of Health (SDOH) Interventions [place]: Food Insecurity Present (08/11/2022)  Housing: Low Risk  (08/11/2022)  Transportation Needs: No Transportation Needs (08/11/2022)  Utilities: Not At Risk (08/11/2022)  Financial Resource Strain: Medium Risk (10/19/2018)  Physical Activity: Inactive (10/19/2018)  Social Connections: Unknown (10/19/2018)  Tobacco Use: High Risk (08/15/2022)    Readmission Risk Interventions    11/13/2020    2:27 PM  Readmission Risk Prevention Plan  Transportation Screening Complete  PCP or Specialist Appt within 3-5 Days Complete  HRI or [place] Complete  Social Work Consult for [place] Planning/Counseling Complete  Palliative Care Screening Not Applicable  Medication Review Press photographer) Complete

## 2022-08-15 NOTE — Progress Notes (Addendum)
Progress Note    08/15/2022 7:47 AM 1 Day Post-Op  Subjective:  sleeping comfortably when I entered room. Upon waking complaining of left leg pain   Vitals:   08/14/22 2324 08/15/22 0422  BP: 130/61 (!) 140/59  Pulse: 94 99  Resp: (!) 23 (!) 22  Temp: 98.7 F (37.1 C) 99.2 F (37.3 C)  SpO2: 99% 97%   Physical Exam: Cardiac:  regular Lungs:  non labored Incisions:  B groin Extremities:  RLE well perfused and warm with doppler PT/DP signals. Right leg dressings c/d/i Abdomen:  soft Neurologic: alert and oriented  CBC    Component Value Date/Time   WBC 21.4 (H) 08/14/2022 0120   RBC 3.47 (L) 08/14/2022 0120   HGB 10.6 (L) 08/14/2022 0120   HGB 14.3 06/20/2012 2127   HCT 31.6 (L) 08/14/2022 0120   HCT 42.9 06/20/2012 2127   PLT 378 08/14/2022 0120   PLT 357 06/20/2012 2127   MCV 91.1 08/14/2022 0120   MCV 91 06/20/2012 2127   MCH 30.5 08/14/2022 0120   MCHC 33.5 08/14/2022 0120   RDW 16.6 (H) 08/14/2022 0120   RDW 13.6 06/20/2012 2127   LYMPHSABS 1.8 11/15/2020 0539   MONOABS 0.8 11/15/2020 0539   EOSABS 0.4 11/15/2020 0539   BASOSABS 0.0 11/15/2020 0539    BMET    Component Value Date/Time   NA 139 08/13/2022 0218   NA 135 (L) 06/20/2012 2127   K 4.0 08/13/2022 0218   K 4.0 06/20/2012 2127   CL 107 08/13/2022 0218   CL 100 06/20/2012 2127   CO2 26 08/13/2022 0218   CO2 27 06/20/2012 2127   GLUCOSE 214 (H) 08/13/2022 0218   GLUCOSE 250 (H) 06/20/2012 2127   BUN 39 (H) 08/13/2022 0218   BUN 20 (H) 06/20/2012 2127   CREATININE 0.76 08/13/2022 0218   CREATININE 0.60 06/20/2012 2127   CALCIUM 8.3 (L) 08/13/2022 0218   CALCIUM 8.8 06/20/2012 2127   GFRNONAA >60 08/13/2022 0218   GFRNONAA >60 06/20/2012 2127   GFRAA >60 06/20/2012 2127    INR    Component Value Date/Time   INR 1.0 08/03/2022 2156     Intake/Output Summary (Last 24 hours) at 08/15/2022 0747 Last data filed at 08/15/2022 0300 Gross per 24 hour  Intake 3102.94 ml  Output  710 ml  Net 2392.94 ml     Assessment/Plan:  71 y.o. female is s/p   1.  Bilateral common femoral endarterectomy with bovine pericardial patch angioplasty 2.  Bilateral iliofemoral embolectomy with 4 F[place]arty 3.  Bilateral lower extremity embolectomy with long 3 Fogarty and short 4 Fogarty 4.  Aortogram 5.  Stent of bilateral common iliac arteries with 7 x 59 mm VBX 6.  Stent of right external iliac artery with 7 x 100 mm Innova and stent of left external iliac artery with 7 x 60 mm Innova 7.  Bilateral 4 compartment fasciotomies 9 days post op and   1 Day Post-Op Left above knee amputation and sharp excisional debridement of bilateral surgical groin wounds right side 7 x 6 x 4 cm left side 10 x 4 x 3 cm with pulse lavage and wound VAC placement   Right lower extremity well perfused and warm with doppler PT/ DP signals Leukocytosis with WBC 21.4 will continue to monitor Continue broad spectrum Abx- Vanc and Zosyn Cultures with GNR. Final report pending Bilateral groin wound VACs with good seal 100 cc SS fluid output Wound VAC change 12/23 or 12/24 Pain  control as needed Hemodynamically stable   Karoline Caldwell, [place] [facility] [phone] 08/15/2022 7:47 AM  I have independently interviewed and examined patient and agree with PA assessment and plan above.  Wound vacs to suction and right lower extremity is warm and well-perfused with a strong posterior tibial signal.  Continue broad-spectrum antibiotics.  Unfortunately growing gram-negative rods on culture from right groin where patch was exposed but now have tissue for coverage over the patch and would not recommend any patch removal at this time unless there is further dehiscence.  Will need wound VAC change this weekend and tailoring antibiotics to culture data.  Azarah Dacy C. Donzetta Matters, MD [facility] [place] Office: [phone] Pager: [phone]

## 2022-08-15 NOTE — TOC Initial Note (Signed)
Transition of Care [facility]) - Initial/Assessment Note    Patient Details  Name: Heather [name] MRN: [id number] Date of Birth: 11/04/1950  Transition [facility]) CM/SW Contact:    Vinie Sill, LCSW Phone Number: 08/15/2022, 5:04 PM  Clinical Narrative:                  CSW met with patient at bedside along with her spouse and son. CSW introduced self and explained role. Family and patient states they are aware of therapy recommendations for short term rehab at SNF. Patient is agreeable to rehab at [facility]. CSW explained the SNF process. Patient has cortrak - will send out SNF referral once cortrak has been removed.   TOC will continue to follow and assist with discharge planning.   Thurmond Butts, MSW, LCSW Clinical Social Worker    Expected Discharge Plan: Skilled Nursing Facility Barriers to Discharge: [place] (PASRR), Insurance Authorization, Continued Medical Work up, SNF Pending bed offer (cortrak)   Patient Goals and CMS Choice            Expected Discharge Plan and Services In-house Referral: Clinical Social Work     Living arrangements for the past 2 months: Single Family Home                                      Prior Living Arrangements/Services Living arrangements for the past 2 months: Single Family Home Lives with:: Self, Spouse Patient language and need for interpreter reviewed:: No        Need for Family Participation in Patient Care: Yes (Comment) Care giver support system in place?: Yes (comment)   Criminal Activity/Legal Involvement Pertinent to Current Situation/Hospitalization: No - Comment as needed  Activities of Daily Living Home Assistive Devices/Equipment: Eyeglasses ADL Screening (condition at time of admission) Patient's cognitive ability adequate to safely complete daily activities?: Yes Is the patient deaf or have difficulty hearing?: No Does the patient have difficulty seeing, even when wearing  glasses/contacts?: No Does the patient have difficulty concentrating, remembering, or making decisions?: No Patient able to express need for assistance with ADLs?: Yes Does the patient have difficulty dressing or bathing?: No Independently performs ADLs?: Yes (appropriate for developmental age) Does the patient have difficulty walking or climbing stairs?: Yes Weakness of Legs: Both Weakness of Arms/Hands: None  Permission Sought/Granted Permission sought to share information with : Family Supports Permission granted to share information with : Yes, Verbal Permission Granted  Share Information with NAME: abigayl hor  Permission granted to share info w AGENCY: SNFs  Permission granted to share info w Relationship: spouse  Permission granted to share info w Contact Information: [phone]  Emotional Assessment Appearance:: Appears older than stated age Attitude/Demeanor/Rapport: Engaged Affect (typically observed): Accepting, Appropriate Orientation: : Oriented to Self, Oriented to Place, Oriented to  Time, Oriented to Situation Alcohol / Substance Use: Not Applicable Psych Involvement: No (comment)  Admission diagnosis:  ICH (intracerebral hemorrhage) (HCC) [I61.9] Patient Active Problem List   Diagnosis Date Noted   ICH (intracerebral hemorrhage) ([place]) 08/04/2022   Atherosclerosis of native arteries of extremity with rest pain ([place]) 04/02/2022   PAD (peripheral artery disease) ([place]) 04/11/2021   Ischemic leg pain 04/11/2021   Chronic mesenteric ischemia ([place]) 12/17/2020   Malnutrition of moderate degree 11/12/2020   Asymptomatic bacteriuria 11/10/2020   Abdominal pain 11/09/2020   Hypokalemia 11/09/2020   AKI (acute kidney injury) ([place])  11/09/2020   MDD (major depressive disorder), recurrent, in full remission ([place]) 01/16/2020   Anxiety disorder 01/16/2020   Hyperlipidemia 12/15/2019   Carotid stenosis 12/15/2019   MDD (major depressive disorder), recurrent, in partial  remission ([place]) 08/24/2019   MDD (major depressive disorder), recurrent episode, mild ([place]) 04/08/2019   Insomnia due to mental condition 04/08/2019   Major neurocognitive disorder due to Alzheimer's disease, probable, without behavioral disturbance 04/08/2019   DDD (degenerative disc disease), cervical 12/19/2016   Frequent falls 12/10/2016   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   Nonrheumatic aortic valve stenosis 09/30/2016   (HFpEF) heart failure with preserved ejection fraction ([place]) 09/20/2016   COPD (chronic obstructive pulmonary disease) ([place]) 09/20/2016   Respiratory failure with hypoxia ([place]) 09/20/2016   NSTEMI (non-ST elevated myocardial infarction) ([place]) 08/21/2016   SOB (shortness of breath) 08/18/2016   Atherosclerosis of native arteries of extremity with intermittent claudication ([place]) 01/08/2016   Palpitations 07/10/2015   History of nonmelanoma skin cancer 03/16/2015   Pseudoaneurysm following procedure ([place]) 07/19/2014   Femoral artery occlusion, right ([place]) 06/28/2014   Claudication ([place]) 06/05/2014   Aortic insufficiency 03/20/2014   CAD (coronary artery disease) 03/20/2014   Chronic pain 03/20/2014   Memory loss 11/09/2012   Depressive disorder 08/02/2012   Neck pain 08/02/2012   Malaise and fatigue 08/02/2012   Hypertension, benign 08/02/2012   Dizziness 08/02/2012   Polyneuropathy in diabetes ([place]) 08/02/2012   Rheumatic fever with heart involvement 08/02/2012   Tobacco use disorder 08/02/2012   Diabetes mellitus ([place]) 08/02/1990   PCP:  [name], FNP Pharmacy:   [facility], [address] [place] [address] Phone: [phone] Fax: [phone]     Social Determinants of Health (SDOH) Social History: SDOH Screenings   Food Insecurity: Food Insecurity Present (08/11/2022)  Housing: Low Risk  (08/11/2022)  Transportation Needs: No Transportation Needs (08/11/2022)  Utilities:  Not At Risk (08/11/2022)  Financial Resource Strain: Medium Risk (10/19/2018)  Physical Activity: Inactive (10/19/2018)  Social Connections: Unknown (10/19/2018)  Tobacco Use: High Risk (08/15/2022)   SDOH Interventions:     Readmission Risk Interventions    11/13/2020    2:27 PM  Readmission Risk Prevention Plan  Transportation Screening Complete  PCP or Specialist Appt within 3-5 Days Complete  HRI or [place] Complete  Social Work Consult for [place] Planning/Counseling Complete  Palliative Care Screening Not Applicable  Medication Review Press photographer) Complete

## 2022-08-16 ENCOUNTER — Inpatient Hospital Stay (HOSPITAL_COMMUNITY): Payer: Medicare HMO

## 2022-08-16 DIAGNOSIS — J811 Chronic pulmonary edema: Secondary | ICD-10-CM | POA: Diagnosis not present

## 2022-08-16 DIAGNOSIS — L89152 Pressure ulcer of sacral region, stage 2: Secondary | ICD-10-CM | POA: Diagnosis not present

## 2022-08-16 DIAGNOSIS — I614 Nontraumatic intracerebral hemorrhage in cerebellum: Secondary | ICD-10-CM | POA: Diagnosis not present

## 2022-08-16 DIAGNOSIS — J810 Acute pulmonary edema: Secondary | ICD-10-CM | POA: Diagnosis not present

## 2022-08-16 DIAGNOSIS — J90 Pleural effusion, not elsewhere classified: Secondary | ICD-10-CM

## 2022-08-16 DIAGNOSIS — K612 Anorectal abscess: Secondary | ICD-10-CM | POA: Diagnosis not present

## 2022-08-16 LAB — BLOOD GAS, ARTERIAL
Acid-Base Excess: 1.7 mmol/L (ref 0.0–2.0)
Acid-Base Excess: 5.8 mmol/L — ABNORMAL HIGH (ref 0.0–2.0)
Bicarbonate: 27.2 mmol/L (ref 20.0–28.0)
Bicarbonate: 30.6 mmol/L — ABNORMAL HIGH (ref 20.0–28.0)
Drawn by: 441371
O2 Saturation: 98.5 %
O2 Saturation: 98.8 %
Patient temperature: 36.9
Patient temperature: 37
pCO2 arterial: 45 mmHg (ref 32–48)
pCO2 arterial: 44 mmHg (ref 32–48)
pH, Arterial: 7.39 (ref 7.35–7.45)
pH, Arterial: 7.45 (ref 7.35–7.45)
pO2, Arterial: 86 mmHg (ref 83–108)
pO2, Arterial: 82 mmHg — ABNORMAL LOW (ref 83–108)

## 2022-08-16 LAB — COMPREHENSIVE METABOLIC PANEL
ALT: 29 U/L (ref 0–44)
AST: 29 U/L (ref 15–41)
Albumin: 1.6 g/dL — ABNORMAL LOW (ref 3.5–5.0)
Alkaline Phosphatase: 158 U/L — ABNORMAL HIGH (ref 38–126)
Anion gap: 9 (ref 5–15)
BUN: 30 mg/dL — ABNORMAL HIGH (ref 8–23)
CO2: 25 mmol/L (ref 22–32)
Calcium: 8 mg/dL — ABNORMAL LOW (ref 8.9–10.3)
Chloride: 103 mmol/L (ref 98–111)
Creatinine, Ser: 0.83 mg/dL (ref 0.44–1.00)
GFR, Estimated: >60 mL/min (ref >60)
Glucose, Bld: 295 mg/dL — ABNORMAL HIGH (ref 70–99)
Potassium: 4.4 mmol/L (ref 3.5–5.1)
Sodium: 137 mmol/L (ref 135–145)
Total Bilirubin: 0.7 mg/dL (ref 0.3–1.2)
Total Protein: 5.3 g/dL — ABNORMAL LOW (ref 6.5–8.1)

## 2022-08-16 LAB — CBC WITH DIFFERENTIAL/PLATELET
Abs Immature Granulocytes: 0.33 10*3/uL — ABNORMAL HIGH (ref 0.00–0.07)
Basophils Absolute: 0.1 10*3/uL (ref 0.0–0.1)
Basophils Relative: 0 %
Eosinophils Absolute: 0.2 10*3/uL (ref 0.0–0.5)
Eosinophils Relative: 1 %
HCT: 28.9 % — ABNORMAL LOW (ref 36.0–46.0)
Hemoglobin: 9.2 g/dL — ABNORMAL LOW (ref 12.0–15.0)
Immature Granulocytes: 2 %
Lymphocytes Relative: 6 %
Lymphs Abs: 0.9 10*3/uL (ref 0.7–4.0)
MCH: 29.8 pg (ref 26.0–34.0)
MCHC: 31.8 g/dL (ref 30.0–36.0)
MCV: 93.5 fL (ref 80.0–100.0)
Monocytes Absolute: 1.3 10*3/uL — ABNORMAL HIGH (ref 0.1–1.0)
Monocytes Relative: 8 %
Neutro Abs: 14.3 10*3/uL — ABNORMAL HIGH (ref 1.7–7.7)
Neutrophils Relative %: 83 %
Platelets: 471 10*3/uL — ABNORMAL HIGH (ref 150–400)
RBC: 3.09 MIL/uL — ABNORMAL LOW (ref 3.87–5.11)
RDW: 15.9 % — ABNORMAL HIGH (ref 11.5–15.5)
WBC: 17 10*3/uL — ABNORMAL HIGH (ref 4.0–10.5)
nRBC: 0 % (ref 0.0–0.2)

## 2022-08-16 LAB — PHOSPHORUS: Phosphorus: 2.5 mg/dL (ref 2.5–4.6)

## 2022-08-16 LAB — GLUCOSE, CAPILLARY
Glucose-Capillary: 258 mg/dL — ABNORMAL HIGH (ref 70–99)
Glucose-Capillary: 293 mg/dL — ABNORMAL HIGH (ref 70–99)
Glucose-Capillary: 288 mg/dL — ABNORMAL HIGH (ref 70–99)
Glucose-Capillary: 137 mg/dL — ABNORMAL HIGH (ref 70–99)
Glucose-Capillary: 120 mg/dL — ABNORMAL HIGH (ref 70–99)
Glucose-Capillary: 51 mg/dL — ABNORMAL LOW (ref 70–99)
Glucose-Capillary: 115 mg/dL — ABNORMAL HIGH (ref 70–99)

## 2022-08-16 LAB — MAGNESIUM: Magnesium: 1.9 mg/dL (ref 1.7–2.4)

## 2022-08-16 LAB — CK TOTAL AND CKMB (NOT AT ARMC)
CK, MB: 4.5 ng/mL (ref 0.5–5.0)
Relative Index: 0.6 (ref 0.0–2.5)
Total CK: 719 U/L — ABNORMAL HIGH (ref 38–234)

## 2022-08-16 LAB — BRAIN NATRIURETIC PEPTIDE: B Natriuretic Peptide: 672.4 pg/mL — ABNORMAL HIGH (ref 0.0–100.0)

## 2022-08-16 MED ORDER — IPRATROPIUM-ALBUTEROL 0.5-2.5 (3) MG/3ML IN SOLN
3.0000 mL | Freq: Four times a day (QID) | RESPIRATORY_TRACT | Status: DC
  Administered 2022-08-16 – 2022-08-17 (×5): 3 mL via RESPIRATORY_TRACT
  Filled 2022-08-16 (×5): qty 3

## 2022-08-16 MED ORDER — FUROSEMIDE 10 MG/ML IJ SOLN
INTRAMUSCULAR | Status: AC
  Filled 2022-08-16: qty 2

## 2022-08-16 MED ORDER — FUROSEMIDE 10 MG/ML IJ SOLN
20.0000 mg | Freq: Once | INTRAMUSCULAR | Status: AC
  Administered 2022-08-16: 20 mg via INTRAVENOUS

## 2022-08-16 MED ORDER — ALBUMIN HUMAN 25 % IV SOLN
50.0000 g | Freq: Once | INTRAVENOUS | Status: AC
  Administered 2022-08-16: 50 g via INTRAVENOUS
  Filled 2022-08-16: qty 200

## 2022-08-16 MED ORDER — INSULIN ASPART 100 UNIT/ML IJ SOLN
10.0000 [IU] | INTRAMUSCULAR | Status: DC
  Administered 2022-08-16 – 2022-08-19 (×11): 10 [IU] via SUBCUTANEOUS

## 2022-08-16 MED ORDER — INSULIN GLARGINE-YFGN 100 UNIT/ML ~~LOC~~ SOLN
22.0000 [IU] | Freq: Every day | SUBCUTANEOUS | Status: DC
  Administered 2022-08-16 – 2022-08-21 (×6): 22 [IU] via SUBCUTANEOUS
  Filled 2022-08-16 (×6): qty 0.22

## 2022-08-16 MED ORDER — DEXTROSE 50 % IV SOLN
INTRAVENOUS | Status: AC
  Filled 2022-08-16: qty 50

## 2022-08-16 NOTE — Significant Event (Addendum)
Rapid Response Event Note   Reason for Call :  Work of breathing  Initial Focused Assessment:  Using accessory muscles to breath, complaints of chest pain that is new this episode. Wheezing present. Skin warm and dry. 1-2+ edema.   VS 158/69 (97) HR 101 RR 34 O2 95% 3L [place] 100.3 temp  Interventions:  Abg CXR EKG: NSR Lasix x1 Neb treatment Tylenol, blankets removed Pulmonary hygiene education  Plan of Care:  Call back for further needs  Event Summary:  MD Notified: Ninetta Lights MD, C. Sherral Hammers MD Call Time: [place] Time: 0654 End Time: 0725  Newman Nickels, RN

## 2022-08-16 NOTE — Consult Note (Addendum)
WOC Nurse Consult Note: Pt is followed by the vascular team for assessment and plan of care. Requested to change bilat groin Vac dressings on 12/24.  Our team will perform on Sun as requested, then again on Wed related to the holiday schedule. Supplies ordered to the bedside. Thank-you,  Julien Girt MSN, [place], [place], [place], [place]

## 2022-08-16 NOTE — Progress Notes (Addendum)
Heather [name] YEL:[id number] DOB: 24-Jul-1951 DOA: 08/04/2022 PCP: [name], FNP   Subj: 71 year old female with PMHx of DM with neuropathy, HTN, HLD, COPD, GERD, PAD, s/p TAVR, chronic diastolic CHF, anxiety/depression/schizophrenia, tobacco abuse,   Presented to ER with sudden onset of dizziness, blurred vision, headache, nausea and vomiting. BP in EMS 209/76.  She was admitted to ICU for hemorrhagic stroke and bilateral lower extremity acute on chronic ischemia.  Vascular surgery consulted and s/p bilateral lower extremity femoral endarterectomy and patch angioplasty with iliac stenting, 4 compartment fasciotomies for acute on chronic lower extremity ischemia.  Postop remained on vent and Cleviprex.  Transferred to floor and TRH on 12/17.  Neurology and vascular surgery following.  Vascular surgery plans left lower extremity amputation on 12/21.    Obj:  12/23 A/O x 4 sitting comfortably in bed eating her breakfast.  Wound VAC discontinued by RN staff secondary to leak.   ADDENDUM: Paged patient baseline secondary to increased [place], AMS.    Objective: VITAL SIGNS: Temp: 100.3 F (37.9 C) (12/23 0700) Temp Source: Oral (12/23 0700) BP: 158/69 (12/23 0700) Pulse Rate: 100 (12/23 0700) SPO2; FIO2:   Intake/Output Summary (Last 24 hours) at 08/16/2022 1624 Last data filed at 08/16/2022 4695 Gross per 24 hour  Intake --  Output 850 ml  Net -850 ml     Physical Exam:  General: A/O x 4, positive No acute respiratory distress Eyes: negative scleral hemorrhage, negative anisocoria, negative icterus ENT: Negative Runny nose, negative gingival bleeding, Neck:  Negative scars, masses, torticollis, lymphadenopathy, JVD Lungs: diffuse decreased breath sounds, diffuse wheezing  Cardiovascular: Regular rate and rhythm without murmur gallop or rub normal S1 and S2 Abdomen: OBESE, negative abdominal pain, nondistended, positive soft, bowel sounds, no rebound, no ascites, no appreciable  mass Extremities: bilateral groin wound vacs in place however there is a leak.  LEFT AKA clean and wraped, did not take down manage Skin: Negative rashes, lesions, ulcers Psychiatric:  Negative depression, negative anxiety, negative fatigue, negative mania  Central nervous system:  Cranial nerves II through XII intact, tongue/uvula midline, all extremities muscle strength 5/5, sensation intact throughout, negative dysarthria, negative expressive aphasia, negative receptive aphasia.   ADDENDUM physical Exam:  General: A/O x 3 (did not recall extensive surgery), positive acute respiratory distress Eyes: negative scleral hemorrhage, negative anisocoria, negative icterus ENT: Negative Runny nose, negative gingival bleeding, Neck:  Negative scars, masses, torticollis, lymphadenopathy, JVD Lungs: tachypnea diffuse decreased air movement, positive expiratory wheezes  Cardiovascular: Tachycardic without murmur gallop or rub normal S1 and S2 Abdomen: negative abdominal pain, nondistended, positive soft, bowel sounds, no rebound, no ascites, no appreciable mass Central nervous system:  Cranial nerves II through XII intact, tongue/uvula midline, all extremities muscle strength 4 /5, sensation intact throughout, unable to perform finger nose finger bilateral, difficulty performing quick finger touch bilateral  negative dysarthria, negative expressive aphasia, negative receptive aphasia.      Mobility Assessment (last 72 hours)     Mobility Assessment     Row Name 08/15/22 2100 08/15/22 1200 08/14/22 1000       Does patient have an order for bedrest or is patient medically unstable No - Continue assessment -- No - Continue assessment     What is the highest level of mobility based on the progressive mobility assessment? Level 1 (Bedfast) - Unable to balance while sitting on edge of bed Level 1 (Bedfast) - Unable to balance while sitting on edge of bed Level 1 (Bedfast) - Unable to balance  while  sitting on edge of bed     Is the above level different from baseline mobility prior to current illness? Yes - Recommend PT order -- Yes - Recommend PT order                DVT prophylaxis: Subcu heparin Code Status: Full Family Communication:  Status is: Inpatient    Dispo: The patient is from: Home              Anticipated d/c is to: Home              Anticipated d/c date is: > 3 days              Patient currently is not medically stable to d/c.    Procedures/Significant Events: CTA head and neck: Negative for large vessel occlusion.  70% stenosis of innominate artery origin. Follow-up CT head 12/12: Stable intraparenchymal hematoma within the cerebellar vermis.  Stable moderate mass effect upon the fourth ventricle. MRI brain stable left cerebellar ICH with extension to fourth ventricle.  No hydrocephalus. Repeat CT head 12/14: Stable hematoma and no hydrocephalus. TTE: LVEF 60-65%, LDL 34, A1c 12.9.  12/11 Ehocardiogram  Left Ventricle: Left ventricular ejection fraction, by estimation, is 60  to 65%. The left ventricle has normal function. The left ventricle has no  regional wall motion abnormalities. The left ventricular internal cavity  size was normal in size.  Suboptimal image quality limits for assessment of left ventricular  hypertrophy. Left ventricular diastolic parameters are indeterminate.   Right Ventricle: The right ventricular size is normal. Right vetricular  wall thickness was not well visualized. Right ventricular systolic  function is normal.    Aortic Valve: The aortic valve has been repaired/replaced. Aortic valve  regurgitation is trivial. Aortic valve mean gradient measures 9.2 mmHg.  Aortic valve peak gradient measures 17.8 mmHg. Aortic valve area, by VTI  measures 2.35 cm. There is a 23 mm  Sapien prosthetic, stented (TAVR) valve present in the aortic position.  Procedure Date: 09/2016. Echo findings are consistent with normal structure   and function of the aortic valve prosthesis.  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 Servando Snare   Cultures 12/21 Groin positive moderate GNR/rare Proteus Mirabilis   Antimicrobials: Anti-infectives (From admission, onward)    Start     Ordered Stop   08/15/22 1100  vancomycin (VANCOCIN) IVPB 1000 mg/200 mL premix  Status:  Discontinued        08/14/22 1006 08/15/22 1307   08/14/22 1400  piperacillin-tazobactam (ZOSYN) IVPB 3.375 g        08/14/22 1006     08/14/22 1100  vancomycin (VANCOREADY) IVPB 1500 mg/300 mL        08/14/22 1006 08/14/22 1502   08/04/22 1045  ceFAZolin (ANCEF) IVPB 2g/100 mL premix        08/04/22 0958 08/04/22 1100         Assessment & Plan: Covid vaccination;   Principal Problem:   ICH (intracerebral hemorrhage) ([place]) Active Problems:   Abscess of anal and rectal regions   Decubitus ulcer of sacral region, stage 2 ([place])  Bilateral lower extremity acute on chronic ischemia: -  s/p bilateral lower extremity femoral endarterectomy and patch angioplasty with iliac stenting, 4 compartment fasciotomiesPost op care per vasc surgery  - ck decreasing, 6462 on 12/16, down to 3379 on 12/19. -Vascular surgery follow-up appreciated.  LLE with no palpable or dopplerable pulses, cool foot to touch with mottling of the toes, reportedly worse than before.   - Anticipating she will get an AKA on 12/21 as discussed today with Dr. Donzetta Matters, [facility]. -IV heparin discontinued 12/16 and now on aspirin 81 Mg daily and Plavix 75 Mg daily.  See discussion below regarding antiplatelets. -Discussed with Dr. Leonie Man, neurology on 12/19.  From a stroke perspective, patient only needs a single  antiplatelet agent.  If vascular surgery wishes to continue Plavix then he recommends discontinuing aspirin.  Communicated with Dr. Donzetta Matters, [place] to continue Plavix. ASA DC'ed. -12/20 vascular surgery plans LEFT AKA on 12/21 -11/22 groin abscess positive moderate GNR/rare Proteus Mirabilis - 12/22 DC vancomycin, continue Zosyn x 7 days   Acute cerebellar vermis intraparenchymal hemorrhage  -Code stroke CT: Acute intraparenchymal hematoma within the cerebellar vermis measuring 11 cc in volume and mild mass effect upon the fourth ventricle. -Negative stroke per CT/MRI scan.  Small ICH see below  was on aspirin 81 Mg daily + Plavix 75 Mg daily PTA, then here on IV heparin per stroke protocol which was switched to aspirin 81 Mg daily on 12/17.  Remains on Plavix 75 Mg daily as well. As per therapy recommendations, SNF when medically optimized. -Neurology signed off 12/18.   -Please see above regarding discussion with Dr. Leonie Man on 12/19 regarding antiplatelets. -12/20 negative neurological deficit  AMS - 12/23 PAGED to patient's bedside secondary to increased WBC, AMS.  Patient was diaphoretic inappropriately responsive, tachycardic.  After~5 minutes post nebulizer treatment began to answer some questions appropriately but definite change from this morning. - 12/23 obtain repeat CT head: Extension of ICH? - 12/23 obtain EEG, dependent upon findings will consult neurology. -12/23 seizure precautions   Acute respiratory failure with hypoxia/Pulmonary edema/Pneumonia Secondary to acute cerebellar hemorrhagic stroke and postop Extubated 12/14. -12/20 on room air -12/23 acute pulmonary edema: Titrate O2 to maintain SpO2> 93% -12/23 continue Zosyn minimal 7 days.  See bilateral lower extremity ischemia -12/23 flutter valve - 12/23 incentive spirometry - 12/23 DuoNeb QID -12/23 Revefenacin [place] Bees) -12/23 DuoNeb QID -12/23 Albumin  50 gx 1  Hx of COPD with tobacco abuse. -triple therapy nebs  -No  clinical exacerbation at this time.   Hx of depression, bipolar, schizophrenia, insomnia. - trazodone at night    HTN emergency. -Coreg 12.5 mg BID -Hydralazine PRN -Labetalol PRN -12/20 BP currently controlled  Hx of CAD, chronic HFpEF, HLD, HTN, s/p TAVR. - goal SBP <160 -Antiplatelet management as above.  Cozaar being held due to AKI. - holding lipitor in setting of elevated CK  - Got a couple doses of IV Lasix in ICU. -12/20 echocardiogram does not support diagnosis of HFpEF -Troponins never trended.  Patient currently without CP -Strict in and out +10.7 L - Daily weight     AKI from rhabdomyolysis. -Trend CK  Latest Reference Range & Units 08/09/22 07:42 08/11/22 03:57 08/12/22 04:20 08/13/22 02:18 08/16/22 04:35  CK Total 38 - 234 U/L 6,462 (H) 3,982 (H) 3,379 (H) 2,499 (H) 719 (H)  (H): Data is abnormally high -12/22 increase LR 169m/hr -12/23 LR stopped this a.m. secondary to increased WOB/PCXR showing pulmonary edema and small effusion see PCXR -12/23 will need to restart fluids later this afternoon at slower  rate.  Non-gap metabolic acidosis. -AKI and metabolic acidosis resolved -Resolved.   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 -12/12 hemoglobin A1c= 12.9 - 12/23 increase Semglee 22 units daily - 12/23 increase NovoLog 10 units q 4 hrs - Resistant SSI CBG (last 3)  Recent Labs    08/16/22 0104 08/16/22 0458 08/16/22 0814  GLUCAP 258* 293* 288*      DM neuropathy. -See DM type II   Anemia of critical illness. - f/u CBC - transfuse for hgb <7 -Hemoglobin has dropped from 8.5-7.9 >7.6>7.7 in the absence of overt bleeding.  Follow CBC in a.m. and transfuse if hemoglobin 7 g or less. -12/20 transfuse 2 unit PRBC Lab Results  Component Value Date   HGB 9.2 (L) 08/16/2022   HGB 10.6 (L) 08/14/2022   HGB 9.0 (L) 08/13/2022   HGB 6.8 (LL) 08/13/2022   HGB 7.7 (L) 08/12/2022     Dysphagia: Likely related to hemorrhagic stroke Has left  nasal core track with ongoing tube feeds. Failed swallow evaluation on 12/16. As per SLP follow-up, completed FEES and recommended dysphagia to 2 diet with thin liquids on 12/18. -12/20 positive anorexia    Nutritional Status Nutrition Problem: Increased nutrient needs Etiology: wound healing Signs/Symptoms: estimated needs Interventions: Tube feeding, MVI  Stage II sacral decubitus ulcer Pressure Injury 08/04/22 Sacrum Stage 2 -  Partial thickness loss of dermis presenting as a shallow open injury with a red, pink wound bed without slough. (Active)  08/04/22 0100  Location: Sacrum  Location Orientation:   Staging: Stage 2 -  Partial thickness loss of dermis presenting as a shallow open injury with a red, pink wound bed without slough.  Wound Description (Comments):   Present on Admission: Yes  Dressing Type Foam - Lift dressing to assess site every shift 08/15/22 1156        Mobility Assessment (last 72 hours)     Mobility Assessment     Row Name 08/15/22 2100 08/15/22 1200 08/14/22 1000       Does patient have an order for bedrest or is patient medically unstable No - Continue assessment -- No - Continue assessment     What is the highest level of mobility based on the progressive mobility assessment? Level 1 (Bedfast) - Unable to balance while sitting on edge of bed Level 1 (Bedfast) - Unable to balance while sitting on edge of bed Level 1 (Bedfast) - Unable to balance while sitting on edge of bed     Is the above level different from baseline mobility prior to current illness? Yes - Recommend PT order -- Yes - Recommend PT order                Time: 50 minutes       Care during the described time interval was provided by me .  I have reviewed this patient's available data, including medical history, events of note, physical examination, and all test results as part of my evaluation.

## 2022-08-16 NOTE — Progress Notes (Signed)
RT instructed patient on the use of a flutter valve. Patient was able to demonstrate proper technique with coaching.

## 2022-08-16 NOTE — Significant Event (Signed)
Rapid Response Event Note   Reason for Call :  "Foaming at mouth"  Initial Focused Assessment:  Patient diaphoretic, red in face, A&O x2. Patient unable to carry conversation. Lungs sound tight, wheezing audible. Patient unable to recall faces in room, or events that have happened throughout day.   VS 176/68 (95) HR 77 RR 28 O2 92% 4L  Temp 97.8 oral CBG 51  Interventions:  MD Woods at bedside Duoneb tx given  1/2 amp D50 CT head CXR ABG EEG  Plan of Care:  MD to re-consult Neuro if needed.  VS 132/52 (76) HR 70 RR 24 O2 96% 4L CBG 120  Event Summary:  MD Notified:  Call Time: Christine Time: 8288 End Time: 1800  Newman Nickels, RN

## 2022-08-16 NOTE — Progress Notes (Signed)
EEG complete - results pending 

## 2022-08-16 NOTE — Progress Notes (Signed)
Pt is currently at CT, check back as schedule permits

## 2022-08-16 NOTE — Progress Notes (Addendum)
Progress Note    08/16/2022 7:48 AM 2 Days Post-Op  Subjective: rapid response called this morning due to respiratory distress.  She is now sitting up eating breakfast with non labored breathing on O2 by [place]  Wound vac to both groins malfunctioned overnight.  Vac was turned off.   Vitals:   08/16/22 0700 08/16/22 0704  BP: (!) 158/69   Pulse: 100   Resp: (!) 34   Temp: 100.3 F (37.9 C)   SpO2: 95% 91%   Physical Exam: Lungs:  O2 by [place] Incisions:  vac Extremities:  R foot warm with motor and sensation intact; L AKA dressing left in place Neurologic: A&O  CBC    Component Value Date/Time   WBC 17.0 (H) 08/16/2022 0435   RBC 3.09 (L) 08/16/2022 0435   HGB 9.2 (L) 08/16/2022 0435   HGB 14.3 06/20/2012 2127   HCT 28.9 (L) 08/16/2022 0435   HCT 42.9 06/20/2012 2127   PLT 471 (H) 08/16/2022 0435   PLT 357 06/20/2012 2127   MCV 93.5 08/16/2022 0435   MCV 91 06/20/2012 2127   MCH 29.8 08/16/2022 0435   MCHC 31.8 08/16/2022 0435   RDW 15.9 (H) 08/16/2022 0435   RDW 13.6 06/20/2012 2127   LYMPHSABS 0.9 08/16/2022 0435   MONOABS 1.3 (H) 08/16/2022 0435   EOSABS 0.2 08/16/2022 0435   BASOSABS 0.1 08/16/2022 0435    BMET    Component Value Date/Time   NA 137 08/16/2022 0435   NA 135 (L) 06/20/2012 2127   K 4.4 08/16/2022 0435   K 4.0 06/20/2012 2127   CL 103 08/16/2022 0435   CL 100 06/20/2012 2127   CO2 25 08/16/2022 0435   CO2 27 06/20/2012 2127   GLUCOSE 295 (H) 08/16/2022 0435   GLUCOSE 250 (H) 06/20/2012 2127   BUN 30 (H) 08/16/2022 0435   BUN 20 (H) 06/20/2012 2127   CREATININE 0.83 08/16/2022 0435   CREATININE 0.60 06/20/2012 2127   CALCIUM 8.0 (L) 08/16/2022 0435   CALCIUM 8.8 06/20/2012 2127   GFRNONAA >60 08/16/2022 0435   GFRNONAA >60 06/20/2012 2127   GFRAA >60 06/20/2012 2127    INR    Component Value Date/Time   INR 1.0 08/03/2022 2156     Intake/Output Summary (Last 24 hours) at 08/16/2022 0748 Last data filed at 08/16/2022  0649 Gross per 24 hour  Intake --  Output 1625 ml  Net -1625 ml     Assessment/Plan:  71 y.o. female is s/p 1.  Bilateral common femoral endarterectomy with bovine pericardial patch angioplasty 2.  Bilateral iliofemoral embolectomy with 4 [place] 3.  Bilateral lower extremity embolectomy with long 3 [place]arty and short 4 [place] 4.  Aortogram 5.  Stent of bilateral common iliac arteries with 7 x 59 mm VBX 6.  Stent of right external iliac artery with 7 x 100 mm Innova and stent of left external iliac artery with 7 x 60 mm Innova 7.  Bilateral 4 compartment fasciotomies 9 days post op and    1 Day Post-Op Left above knee amputation and sharp excisional debridement of bilateral surgical groin wounds right side 7 x 6 x 4 cm left side 10 x 4 x 3 cm with pulse lavage and wound VAC placement  2 Days Post-Op   R foot warm and well perfused with motor and sensation intact Wound vac both groins malfunctioned overnight.  Vacs will be changed later this morning Continue broad spectrum antibiotics until culture and sensitivity is finalized  Dagoberto Ligas, PA-C [facility] [phone] 08/16/2022 7:48 AM  I have interviewed the patient and examined the patient. I agree with the findings by the PA. VAC's changed this AM. Continue VAC change using white foam at base. Getting tube feeds for nutrition. L AKA looks fine. Right foot stable.  Gae Gallop, MD

## 2022-08-16 NOTE — Progress Notes (Signed)
Notified by RN that patient is complaining of shortness of breath.  No chest pain.  She was hypoxic to the 80s on room air and placed on 3 L O2.  Lungs wet sounding and wheezing.  Current vital signs: Afebrile, heart rate 101, respiratory rate 26, blood pressure 150/59, SpO2 94% on 3 L O2.  Chart reviewed.  In brief, 71 year old female with history of diabetes, hypertension, hyperlipidemia, COPD, GERD, PAD, status post TAVR, chronic diastolic CHF admitted for hemorrhagic stroke, and bilateral lower extremity acute on chronic ischemia status post bilateral lower extremity femoral endarterectomy, patch angioplasty with iliac stenting, 4 compartment fasciotomies, AKI from rhabdomyolysis receiving IV fluids.  Creatinine has trended down, 0.8 on labs done this morning.  Echo done 08/04/2022 showing EF 60 to 65%.  -Hold IV fluids -Albuterol neb treatment -Lasix 20 mg x 1 -Stat chest x-ray, EKG, ABG -Stat BNP

## 2022-08-16 NOTE — Progress Notes (Signed)
Pharmacy Antibiotic Note  Heather [name] is a 71 y.o. female admitted on 08/04/2022 with bilateral lower extremity acute limb ischemia. Patient is now s/p L AKA on 12/21. Scr 0.76 and at baseline. CrCl 57.1 ml/min. WBC 21.4 and trending up in setting of recent surgery. Broad-spectrum antibiotics started post-AKA with plans for likely more definitive surgical repair next week for bilateral groin wounds. Pharmacy consulted to dose Zosyn.   Plan: Continue Zosyn 3.375 mg IV q8 hours Follow future surgical plans Monitor renal function and resolution of s/s of infection  Height: 5' (152.4 cm) Weight: 71 kg (156 lb 8.4 oz) IBW/kg (Calculated) : 45.5  Temp (24hrs), Avg:99.1 F (37.3 C), Min:97.9 F (36.6 C), Max:100.3 F (37.9 C)  Recent Labs  Lab 08/10/22 0325 08/11/22 0357 08/12/22 0420 08/13/22 0218 08/14/22 0120 08/16/22 0435  WBC 18.2* 16.3* 19.6* 21.2* 21.4* 17.0*  CREATININE 0.88 0.78 0.74 0.76  --  0.83     Estimated Creatinine Clearance: 56.2 mL/min (by C-G formula based on SCr of 0.83 mg/dL).    Allergies  Allergen Reactions   Iodinated Contrast Media Hives   Sulfa Antibiotics Hives   Shellfish Allergy Nausea And Vomiting, Rash and Other (See Comments)    Scallops specifically cause VOMITING     Antimicrobials this admission: Cefazolin 12/21 x1 Vancomycin 12/21 >> 12/22 Zosyn 12/21 >>  Microbiology results: 12/21 Wound Cx: proteus mrabilis 12/11 MRSA PCR: negative  Thank you for allowing pharmacy to be a part of this patient's care.  Titus Dubin, PharmD PGY1 Pharmacy Resident 08/16/2022 12:32 PM

## 2022-08-17 DIAGNOSIS — I614 Nontraumatic intracerebral hemorrhage in cerebellum: Secondary | ICD-10-CM | POA: Diagnosis not present

## 2022-08-17 DIAGNOSIS — R4182 Altered mental status, unspecified: Secondary | ICD-10-CM

## 2022-08-17 DIAGNOSIS — K612 Anorectal abscess: Secondary | ICD-10-CM | POA: Diagnosis not present

## 2022-08-17 DIAGNOSIS — J810 Acute pulmonary edema: Secondary | ICD-10-CM | POA: Diagnosis not present

## 2022-08-17 DIAGNOSIS — L89152 Pressure ulcer of sacral region, stage 2: Secondary | ICD-10-CM | POA: Diagnosis not present

## 2022-08-17 LAB — GLUCOSE, CAPILLARY
Glucose-Capillary: 120 mg/dL — ABNORMAL HIGH (ref 70–99)
Glucose-Capillary: 147 mg/dL — ABNORMAL HIGH (ref 70–99)
Glucose-Capillary: 192 mg/dL — ABNORMAL HIGH (ref 70–99)
Glucose-Capillary: 224 mg/dL — ABNORMAL HIGH (ref 70–99)
Glucose-Capillary: 237 mg/dL — ABNORMAL HIGH (ref 70–99)
Glucose-Capillary: 244 mg/dL — ABNORMAL HIGH (ref 70–99)
Glucose-Capillary: 247 mg/dL — ABNORMAL HIGH (ref 70–99)

## 2022-08-17 LAB — AEROBIC/ANAEROBIC CULTURE W GRAM STAIN (SURGICAL/DEEP WOUND): Gram Stain: NONE SEEN

## 2022-08-17 LAB — CBC WITH DIFFERENTIAL/PLATELET
Abs Immature Granulocytes: 0.15 10*3/uL — ABNORMAL HIGH (ref 0.00–0.07)
Basophils Absolute: 0.1 10*3/uL (ref 0.0–0.1)
Basophils Relative: 0 %
Eosinophils Absolute: 0.2 10*3/uL (ref 0.0–0.5)
Eosinophils Relative: 1 %
HCT: 26.8 % — ABNORMAL LOW (ref 36.0–46.0)
Hemoglobin: 8.7 g/dL — ABNORMAL LOW (ref 12.0–15.0)
Immature Granulocytes: 1 %
Lymphocytes Relative: 7 %
Lymphs Abs: 1.1 10*3/uL (ref 0.7–4.0)
MCH: 30.6 pg (ref 26.0–34.0)
MCHC: 32.5 g/dL (ref 30.0–36.0)
MCV: 94.4 fL (ref 80.0–100.0)
Monocytes Absolute: 1.2 10*3/uL — ABNORMAL HIGH (ref 0.1–1.0)
Monocytes Relative: 7 %
Neutro Abs: 13.4 10*3/uL — ABNORMAL HIGH (ref 1.7–7.7)
Neutrophils Relative %: 84 %
Platelets: 466 10*3/uL — ABNORMAL HIGH (ref 150–400)
RBC: 2.84 MIL/uL — ABNORMAL LOW (ref 3.87–5.11)
RDW: 15.5 % (ref 11.5–15.5)
WBC: 16 10*3/uL — ABNORMAL HIGH (ref 4.0–10.5)
nRBC: 0 % (ref 0.0–0.2)

## 2022-08-17 LAB — BPAM RBC
Blood Product Expiration Date: 202312272359
Blood Product Expiration Date: 202401132359
Blood Product Expiration Date: 202401132359
ISSUE DATE / TIME: 202312200918
ISSUE DATE / TIME: 202312201746
Unit Type and Rh: 5100
Unit Type and Rh: 5100
Unit Type and Rh: 5100

## 2022-08-17 LAB — COMPREHENSIVE METABOLIC PANEL
ALT: 27 U/L (ref 0–44)
AST: 27 U/L (ref 15–41)
Albumin: 2.1 g/dL — ABNORMAL LOW (ref 3.5–5.0)
Alkaline Phosphatase: 137 U/L — ABNORMAL HIGH (ref 38–126)
Anion gap: 10 (ref 5–15)
BUN: 31 mg/dL — ABNORMAL HIGH (ref 8–23)
CO2: 26 mmol/L (ref 22–32)
Calcium: 8.5 mg/dL — ABNORMAL LOW (ref 8.9–10.3)
Chloride: 103 mmol/L (ref 98–111)
Creatinine, Ser: 0.83 mg/dL (ref 0.44–1.00)
GFR, Estimated: >60 mL/min (ref >60)
Glucose, Bld: 271 mg/dL — ABNORMAL HIGH (ref 70–99)
Potassium: 4.5 mmol/L (ref 3.5–5.1)
Sodium: 139 mmol/L (ref 135–145)
Total Bilirubin: 1.1 mg/dL (ref 0.3–1.2)
Total Protein: 5.5 g/dL — ABNORMAL LOW (ref 6.5–8.1)

## 2022-08-17 LAB — TYPE AND SCREEN
ABO/RH(D): O POS
Antibody Screen: NEGATIVE
Unit division: 0
Unit division: 0
Unit division: 0

## 2022-08-17 LAB — CK TOTAL AND CKMB (NOT AT ARMC)
CK, MB: 3.1 ng/mL (ref 0.5–5.0)
Relative Index: 0.7 (ref 0.0–2.5)
Total CK: 442 U/L — ABNORMAL HIGH (ref 38–234)

## 2022-08-17 LAB — PHOSPHORUS: Phosphorus: 3.3 mg/dL (ref 2.5–4.6)

## 2022-08-17 LAB — MAGNESIUM: Magnesium: 2.1 mg/dL (ref 1.7–2.4)

## 2022-08-17 IMAGING — CT CT ABD-PELV W/O CM
2 of 4 series · 16 of 46 positions shown, 18 images · non-contrast
Comparison: Remote CT 03/04/2012

CLINICAL DATA: Acute abdominal pain. Elevated white blood cell
count.

EXAM:
CT ABDOMEN AND PELVIS WITHOUT CONTRAST
TECHNIQUE: Multidetector CT imaging of the abdomen and pelvis was performed
following the standard protocol without IV contrast.
Patient reports IV contrast allergy.

[Series 2: routine abd/pel wo · axial · 0.70mm/px · z∈[-948,-593]mm · 13 of 79 slices shown, 15 images]
[im 4/79  soft-tissue]
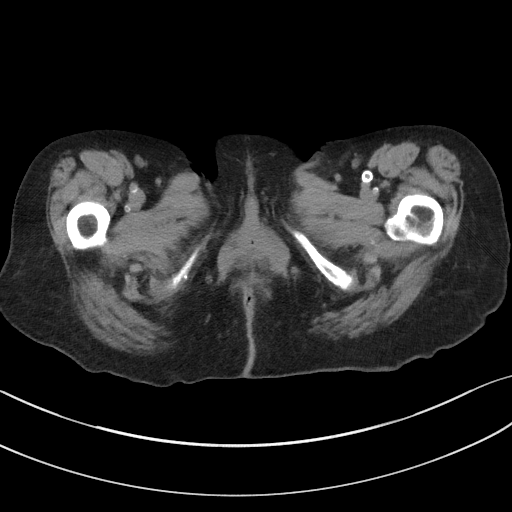
[im 4/79  bone]
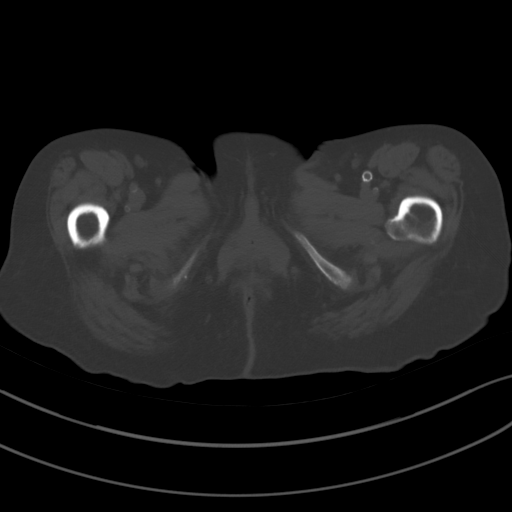
[im 10/79  soft-tissue]
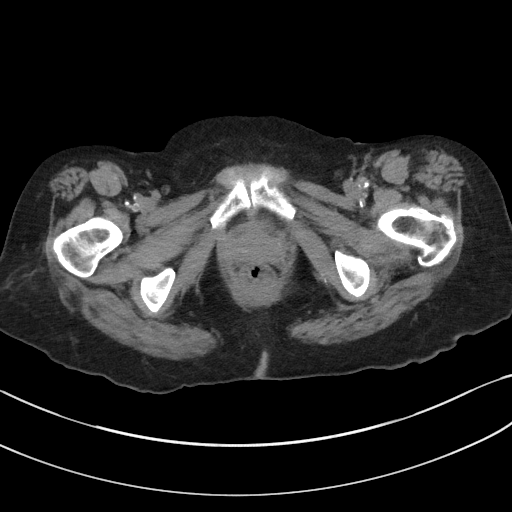
[im 16/79  soft-tissue]
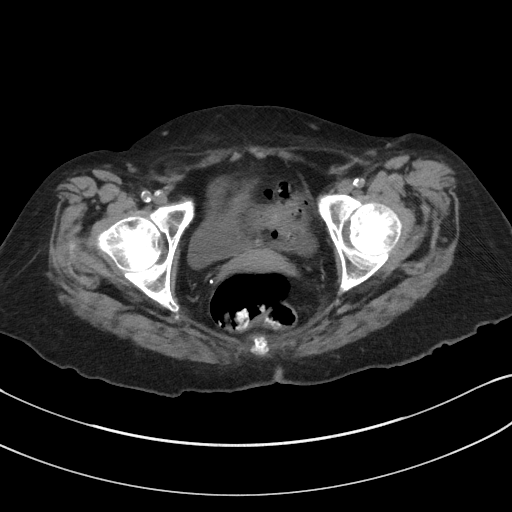
[im 22/79  soft-tissue]
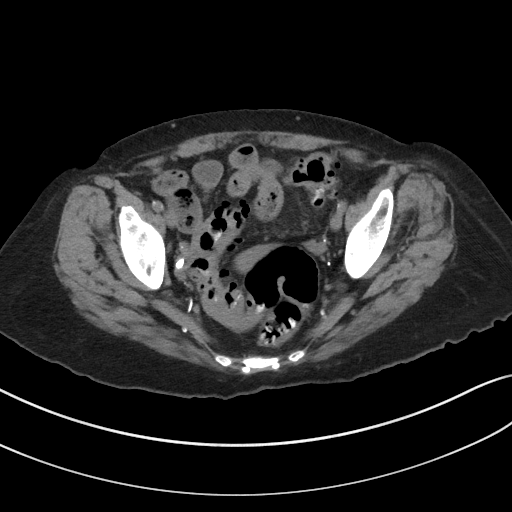
[im 29/79  soft-tissue]
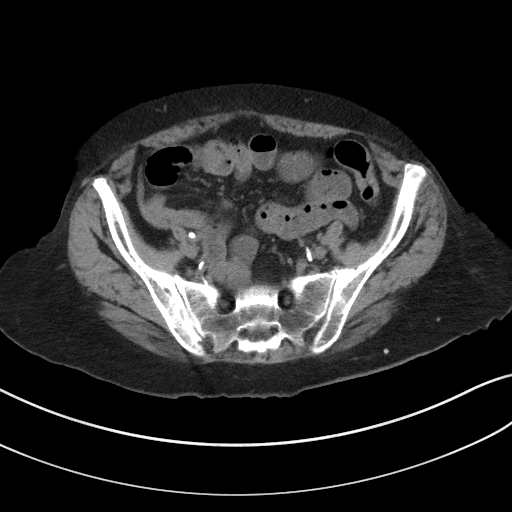
[im 35/79  soft-tissue]
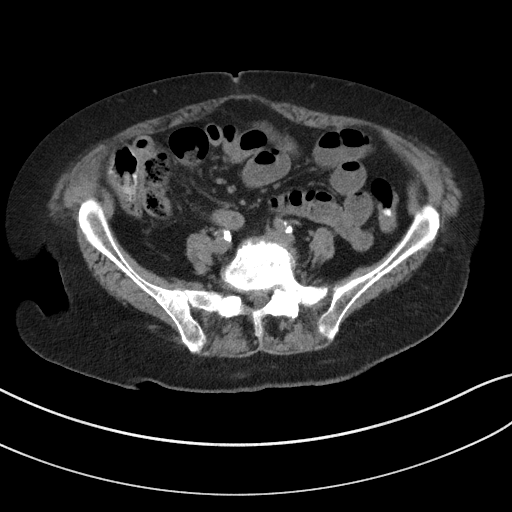
[im 41/79  soft-tissue]
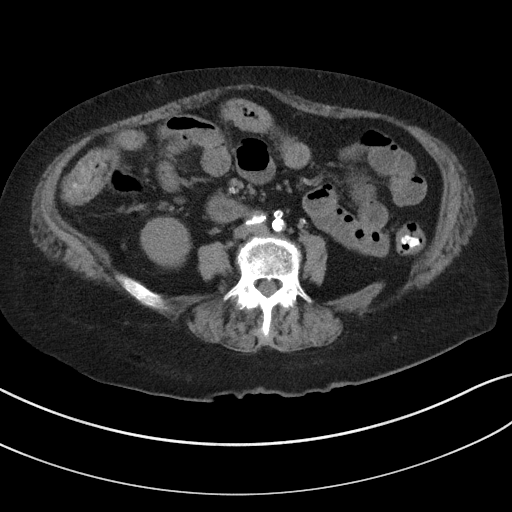
[im 44/79  soft-tissue]
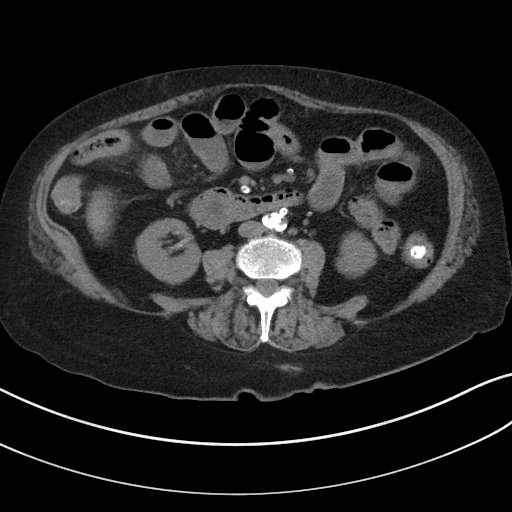
[im 50/79  soft-tissue]
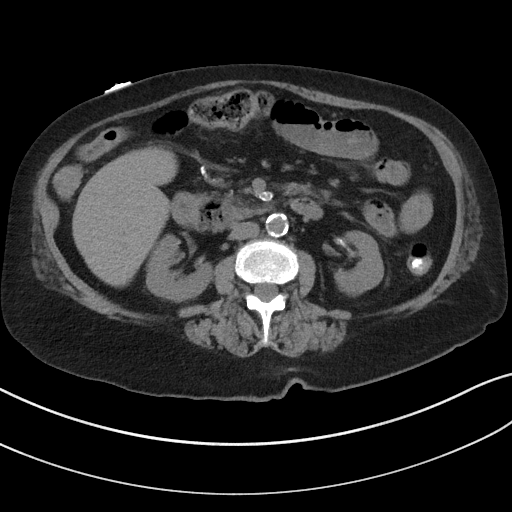
[im 50/79  bone]
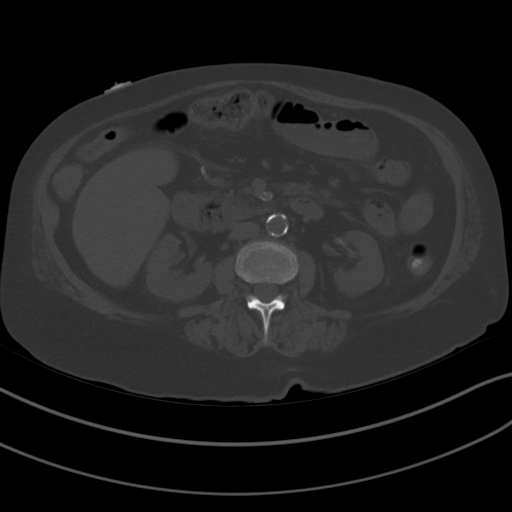
[im 57/79  soft-tissue]
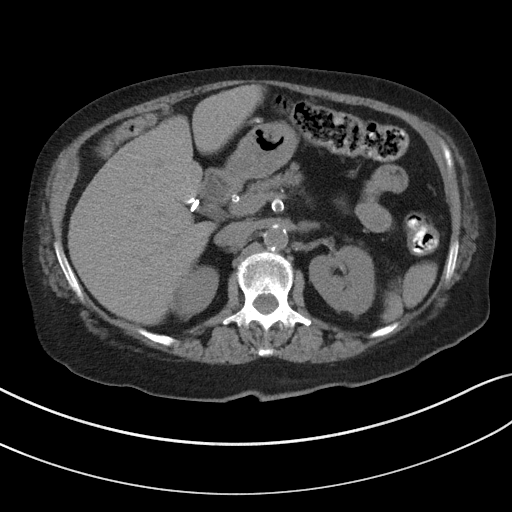
[im 63/79  soft-tissue]
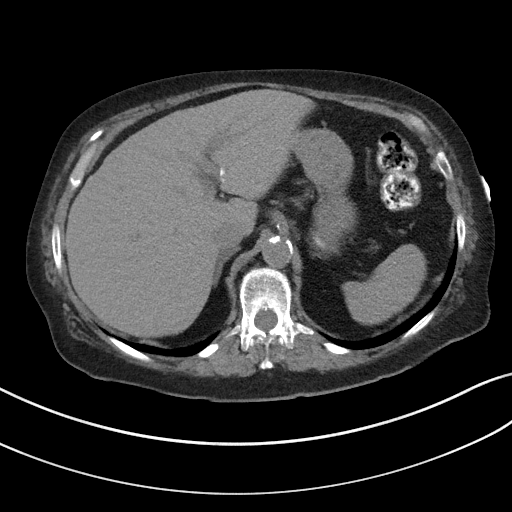
[im 69/79  soft-tissue]
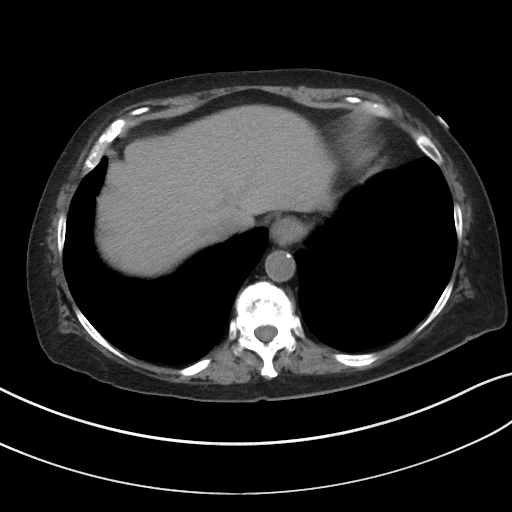
[im 75/79  soft-tissue]
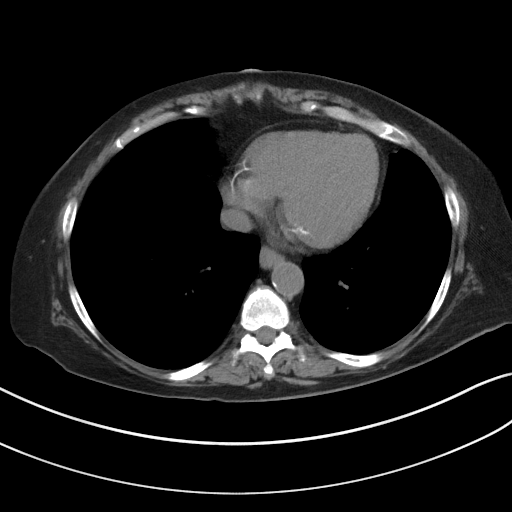

[Series 5: coronal st · coronal · 0.70mm/px · 3 of 82 slices shown]
[im 28/82  soft-tissue]
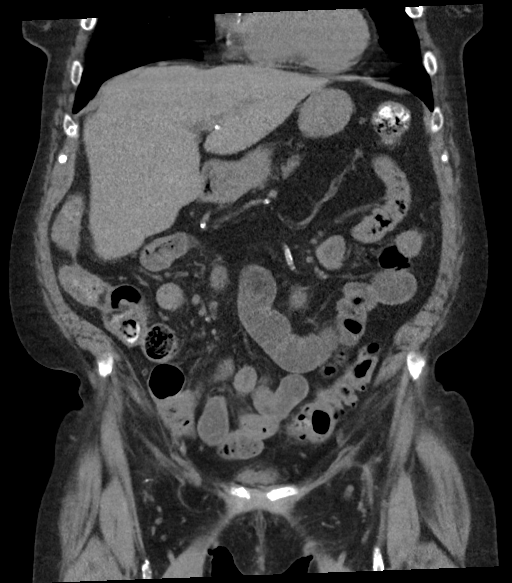
[im 37/82  soft-tissue]
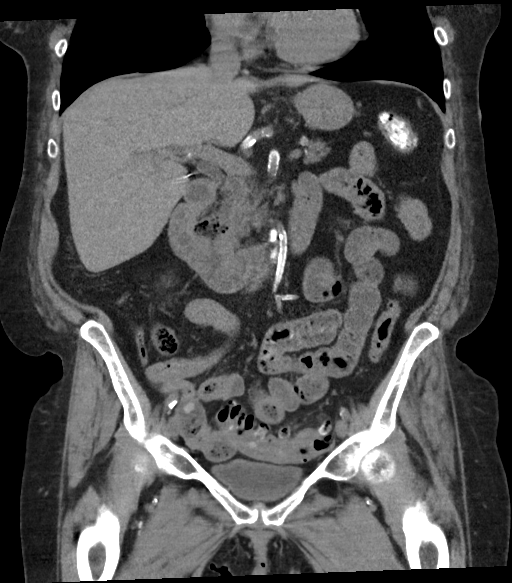
[im 46/82  soft-tissue]
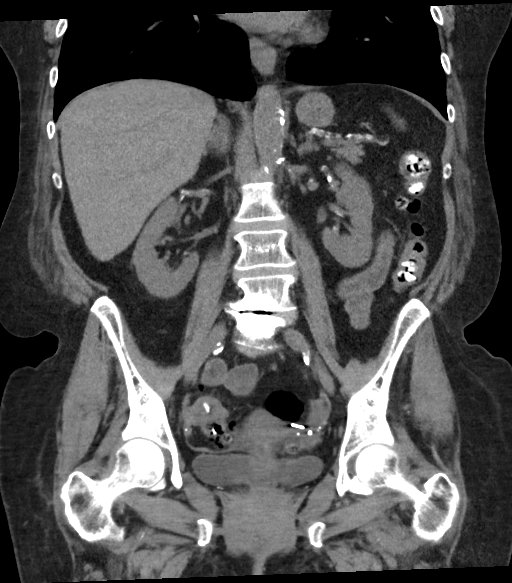

[16 of 46 positions shown; findings below may reference images not displayed]

FINDINGS: Lower chest: No acute airspace disease or pleural fluid. There are
coronary artery calcifications. Heart is normal in size.

Hepatobiliary: No focal liver abnormality is seen. Status post
cholecystectomy. No biliary dilatation.

Pancreas: Mild fatty atrophy of the pancreatic head. No ductal
dilatation or inflammation.

Spleen: Normal in size without focal abnormality.

Adrenals/Urinary Tract: Normal right adrenal gland. Mild left
adrenal thickening without dominant nodule. No hydronephrosis. No
perinephric edema. Prominent renal vascular calcifications at the
hila, no definite renal calculi. Urinary bladder partially
distended. No bladder wall thickening.

Stomach/Bowel: Small hiatal hernia. Stomach decompressed. There is a
small duodenal diverticulum. Fluid-filled loops of small bowel that
are prominent but not particularly dilated. No evidence of
obstruction. No small bowel inflammation. Mild fecalization of
distal small bowel contents. Normal appendix.

Multiple colonic diverticula particularly in the sigmoid colon.
Suspected inflamed diverticulum with pericolonic fat stranding in
the mid sigmoid, series 5, image 35. There is diffuse sigmoid
colonic wall thickening.

Vascular/Lymphatic: Advanced aortic and branch atherosclerosis.
Mesenteric and renal vessels are densely calcified. No aneurysm. No
bulky abdominopelvic adenopathy.

Reproductive: Normal for age atrophic uterus.  No adnexal mass.

Other: No free air, abscess, ascites or focal fluid collection. Tiny
fat containing umbilical hernia.

Musculoskeletal: Bones diffusely under mineralized. Scoliosis and
degenerative change in the lower lumbar spine most prominent at
L4-L5 and L5-S1. Mild T11 compression fracture is chronic compared
to 09/26/2014 chest CT.
IMPRESSION: 1. Acute uncomplicated diverticulitis of the mid sigmoid colon. No
perforation or abscess.
2. Diffuse sigmoid colonic wall thickening, favored to be related to
mural hypertrophy.
3. Small hiatal hernia.
4. Advanced aortic and branch atherosclerosis.

Aortic Atherosclerosis ([id number]).

## 2022-08-17 MED ORDER — IPRATROPIUM-ALBUTEROL 0.5-2.5 (3) MG/3ML IN SOLN
3.0000 mL | Freq: Three times a day (TID) | RESPIRATORY_TRACT | Status: DC
  Administered 2022-08-17 – 2022-08-20 (×9): 3 mL via RESPIRATORY_TRACT
  Filled 2022-08-17 (×9): qty 3

## 2022-08-17 NOTE — Plan of Care (Signed)
  Problem: Education: Goal: Knowledge of disease or condition will improve Outcome: Progressing   Problem: Coping: Goal: Will verbalize positive feelings about self Outcome: Progressing Goal: Will identify appropriate support needs Outcome: Progressing   Problem: Self-Care: Goal: Ability to communicate needs accurately will improve Outcome: Progressing   Problem: Nutrition: Goal: Risk of aspiration will decrease Outcome: Progressing   Problem: Nutritional: Goal: Maintenance of adequate nutrition will improve Outcome: Progressing   Problem: Education: Goal: Knowledge of General Education information will improve Description: Including pain rating scale, medication(s)/side effects and non-pharmacologic comfort measures Outcome: Progressing

## 2022-08-17 NOTE — Consult Note (Signed)
[place] Nurse wound follow up Wound type: surgical x 2; bilateral groin Wound ACZ:YSAY are clean, early granulation, more lymphatic fluid leaking but not constantly from the left groin wound Drainage (amount, consistency, odor) serosanguinous  Periwound: moist Dressing procedure/placement/frequency: Removed old NPWT dressings from each groin wound; 1pc white from the left/1pc black from the left groin 1pc white from the right groin/1pc black from the right groin  Periwound skin protected with skin barrier, using ostomy barrier ring at wound edges in skin/groin folds to aid in seal Right groin: filled wound with , ___1_ piece of white foam, _1__ piece of black foam Left groin: filled with 1 piece of white foam and 1 piece of black foam  Sealed NPWT dressing at 137m HG Patient received IV  pain medication per bedside nurse prior to dressing change Patient tolerated procedure well  [facility], PFort Atkinsonnurse will continue to provide NPWT dressing changed due to the complexity of the dressing change.     [place]

## 2022-08-17 NOTE — Progress Notes (Signed)
RT responded to patients room for resp distress .

## 2022-08-17 NOTE — Procedures (Signed)
Routine EEG Report  Heather [name] is a 71 y.o. female with a history of altered mental status and seizure-like episode who is undergoing an EEG to evaluate for seizures.  Report: This EEG was acquired with electrodes placed according to the International 10-20 electrode system (including Fp1, Fp2, F3, F4, C3, C4, P3, P4, O1, O2, T3, T4, T5, T6, A1, A2, Fz, Cz, Pz). The following electrodes were missing or displaced: none.  The occipital dominant rhythm was 5-6 Hz. This activity is reactive to stimulation. Drowsiness was manifested by background fragmentation; deeper stages of sleep were not identified. There was no focal slowing. There were no interictal epileptiform discharges. There were no electrographic seizures identified. Photic stimulation and hyperventilation were not performed.  Impression and clinical correlation: This EEG was obtained while awake and drowsy and is abnormal due to moderate diffuse slowing indicative of global cerebral dysfunction. Epileptiform abnormalities were not seen during this recording.  Su Monks, MD [facility] [phone]  If 7pm- 7am, please page neurology on call as listed in [employer].

## 2022-08-17 NOTE — Progress Notes (Signed)
   VASCULAR SURGERY ASSESSMENT & PLAN:   OPEN GROIN WOUNDS: Patient is undergone bilateral iliofemoral embolectomies and lower extremity embolectomies with bilateral common iliac artery stents.  She also has a stent in the right external iliac artery and left external iliac artery.  She has open wounds in both groins with a VAC on each groin.  The bovine pericardial patch is exposed on the right and were using white foam on top of this.  Continue meticulous wound care.  POD 2 LEFT AKA: Her amputation site is healing nicely.  Continue daily dressing changes.  NUTRITION: She is getting tube feeds.  ID: She is on IV Zosyn.  Her groin wound grew moderate E. coli and rare Proteus mirabilis.  DVT PROPHYLAXIS: She is getting subcu heparin.   SUBJECTIVE:   No specific complaints this morning.  PHYSICAL EXAM:   Vitals:   08/17/22 0000 08/17/22 0200 08/17/22 0400 08/17/22 0500  BP: (!) 129/58 (!) 133/57 (!) 120/50   Pulse: 95 98 95   Resp: '18 20 18   '$ Temp: 97.7 F (36.5 C)  97.9 F (36.6 C)   TempSrc: Oral  Oral   SpO2: 96% 97% 92%   Weight:    71.6 kg  Height:       The vacs have a good seal. Her right AKA dressing is dry. The left foot is warm.  LABS:   Lab Results  Component Value Date   WBC 16.0 (H) 08/17/2022   HGB 8.7 (L) 08/17/2022   HCT 26.8 (L) 08/17/2022   MCV 94.4 08/17/2022   PLT 466 (H) 08/17/2022   Lab Results  Component Value Date   CREATININE 0.83 08/17/2022   Lab Results  Component Value Date   INR 1.0 08/03/2022   CBG (last 3)  Recent Labs    08/16/22 2024 08/17/22 0031 08/17/22 0437  GLUCAP 115* 247* 237*    PROBLEM LIST:    Principal Problem:   ICH (intracerebral hemorrhage) (HCC) Active Problems:   Abscess of anal and rectal regions   Decubitus ulcer of sacral region, stage 2 (HCC)   Pulmonary edema   CURRENT MEDS:    arformoterol  15 mcg Nebulization BID   budesonide (PULMICORT) nebulizer solution  0.5 mg Nebulization BID    carvedilol  12.5 mg Per Tube BID WC   Chlorhexidine Gluconate Cloth  6 each Topical Q0600   clopidogrel  75 mg Oral Daily   docusate  100 mg Per Tube BID   escitalopram  10 mg Per Tube Daily   feeding supplement (PIVOT 1.5 CAL)  1,000 mL Per Tube Q24H   heparin injection (subcutaneous)  5,000 Units Subcutaneous Q8H   insulin aspart  0-20 Units Subcutaneous Q4H   insulin aspart  10 Units Subcutaneous Q4H   insulin glargine-yfgn  22 Units Subcutaneous Daily   ipratropium-albuterol  3 mL Nebulization QID   multivitamin with minerals  1 tablet Per Tube Daily   nicotine  14 mg Transdermal Daily   mouth rinse  15 mL Mouth Rinse Q4H   oxyCODONE  10 mg Oral Q12H   pantoprazole (PROTONIX) IV  40 mg Intravenous Q24H   polyethylene glycol  17 g Per Tube Daily   revefenacin  175 mcg Nebulization Daily   senna-docusate  1 tablet Per Tube BID   sodium chloride flush  10-40 mL Intracatheter [place] Office: [phone] 08/17/2022

## 2022-08-17 NOTE — Progress Notes (Signed)
Heather [name] [id number] DOB: 1950/11/27 DOA: 08/04/2022 PCP: [name], FNP   Subj: 71 year old female with PMHx of DM with neuropathy, HTN, HLD, COPD, GERD, PAD, s/p TAVR, chronic diastolic CHF, anxiety/depression/schizophrenia, tobacco abuse,   Presented to ER with sudden onset of dizziness, blurred vision, headache, nausea and vomiting. BP in EMS 209/76.  She was admitted to ICU for hemorrhagic stroke and bilateral lower extremity acute on chronic ischemia.  Vascular surgery consulted and s/p bilateral lower extremity femoral endarterectomy and patch angioplasty with iliac stenting, 4 compartment fasciotomies for acute on chronic lower extremity ischemia.  Postop remained on vent and Cleviprex.  Transferred to floor and TRH on 12/17.  Neurology and vascular surgery following.  Vascular surgery plans left lower extremity amputation on 12/21.    Obj:  12/24 A/O x 4.  Complains of LEFT stump pain requesting pain meds.    Objective: VITAL SIGNS: Temp: 98.9 F (37.2 C) (12/24 1214) Temp Source: Oral (12/24 1214) BP: 136/56 (12/24 1214) Pulse Rate: 76 (12/24 1350) SPO2; FIO2:   Intake/Output Summary (Last 24 hours) at 08/17/2022 1359 Last data filed at 08/17/2022 1213 Gross per 24 hour  Intake 3750.95 ml  Output 850 ml  Net 2900.95 ml     Physical Exam:  General: A/O x 4, positive No acute respiratory distress Eyes: negative scleral hemorrhage, negative anisocoria, negative icterus ENT: Negative Runny nose, negative gingival bleeding, Neck:  Negative scars, masses, torticollis, lymphadenopathy, JVD Lungs: diffuse decreased breath sounds, diffuse wheezing  Cardiovascular: Regular rate and rhythm without murmur gallop or rub normal S1 and S2 Abdomen: OBESE, negative abdominal pain, nondistended, positive soft, bowel sounds, no rebound, no ascites, no appreciable mass Extremities: bilateral groin wound vacs in place.  LEFT AKA clean and wraped, did not take down  manage Skin: Negative rashes, lesions, ulcers Psychiatric:  Negative depression, negative anxiety, negative fatigue, negative mania  Central nervous system:  Cranial nerves II through XII intact, tongue/uvula midline, all extremities muscle strength 4/5, sensation intact throughout, finger nose finger bilateral within normal limits, quick finger touch bilateral within normal limits,, negative dysarthria, negative expressive aphasia, negative receptive aphasia.    Mobility Assessment (last 72 hours)     Mobility Assessment     Row Name 08/17/22 1000 08/16/22 2200 08/16/22 1150 08/15/22 2112 08/15/22 2100   Does patient have an order for bedrest or is patient medically unstable No - Continue assessment No - Continue assessment No - Continue assessment No - Continue assessment No - Continue assessment   What is the highest level of mobility based on the progressive mobility assessment? Level 1 (Bedfast) - Unable to balance while sitting on edge of bed Level 2 (Chairfast) - Balance while sitting on edge of bed and cannot stand Level 2 (Chairfast) - Balance while sitting on edge of bed and cannot stand Level 1 (Bedfast) - Unable to balance while sitting on edge of bed Level 1 (Bedfast) - Unable to balance while sitting on edge of bed   Is the above level different from baseline mobility prior to current illness? Yes - Recommend PT order Yes - Recommend PT order Yes - Recommend PT order Yes - Recommend PT order Yes - Recommend PT order    [place] Name 08/15/22 1200           What is the highest level of mobility based on the progressive mobility assessment? Level 1 (Bedfast) - Unable to balance while sitting on edge of bed  DVT prophylaxis: Subcu heparin Code Status: Full Family Communication:  Status is: Inpatient    Dispo: The patient is from: Home              Anticipated d/c is to: Home              Anticipated d/c date is: > 3 days              Patient currently is  not medically stable to d/c.    Procedures/Significant Events: CTA head and neck: Negative for large vessel occlusion.  70% stenosis of innominate artery origin. Follow-up CT head 12/12: Stable intraparenchymal hematoma within the cerebellar vermis.  Stable moderate mass effect upon the fourth ventricle. MRI brain stable left cerebellar ICH with extension to fourth ventricle.  No hydrocephalus. Repeat CT head 12/14: Stable hematoma and no hydrocephalus. TTE: LVEF 60-65%, LDL 34, A1c 12.9.  12/11 Ehocardiogram  Left Ventricle: Left ventricular ejection fraction, by estimation, is 60  to 65%. The left ventricle has normal function. The left ventricle has no  regional wall motion abnormalities. The left ventricular internal cavity  size was normal in size.  Suboptimal image quality limits for assessment of left ventricular  hypertrophy. Left ventricular diastolic parameters are indeterminate.   Right Ventricle: The right ventricular size is normal. Right vetricular  wall thickness was not well visualized. Right ventricular systolic  function is normal.    Aortic Valve: The aortic valve has been repaired/replaced. Aortic valve  regurgitation is trivial. Aortic valve mean gradient measures 9.2 mmHg.  Aortic valve peak gradient measures 17.8 mmHg. Aortic valve area, by VTI  measures 2.35 cm. There is a 23 mm  Sapien prosthetic, stented (TAVR) valve present in the aortic position.  Procedure Date: 09/2016. Echo findings are consistent with normal structure  and function of the aortic valve prosthesis.  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  -Mild regional mass effect without upstream hydrocephalus. 2. No new acute intracranial pathology 12/23 PCXR  Mild interstitial pulmonary edema. 2. Stable support lines and tubes. The right internal jugular central venous catheter appears kinked in its extravascular component.    Consultants:  Vascular surgery Dr. Servando Snare   Cultures 12/21 Groin positive moderate GNR/rare Proteus Mirabilis   Antimicrobials: Anti-infectives (From admission, onward)    Start     Ordered Stop   08/15/22 1100  vancomycin (VANCOCIN) IVPB 1000 mg/200 mL premix  Status:  Discontinued        08/14/22 1006 08/15/22 1307   08/14/22 1400  piperacillin-tazobactam (ZOSYN) IVPB 3.375 g        08/14/22 1006     08/14/22 1100  vancomycin (VANCOREADY) IVPB 1500 mg/300 mL        08/14/22 1006 08/14/22 1502   08/04/22 1045  ceFAZolin (ANCEF) IVPB 2g/100 mL premix        08/04/22 0958 08/04/22 1100         Assessment & Plan: Covid vaccination;   Principal Problem:   ICH (intracerebral hemorrhage) ([place]) Active Problems:   Abscess of anal and rectal regions   Decubitus ulcer of sacral region, stage  2 ([place])   Pulmonary edema  Bilateral lower extremity acute on chronic ischemia: - s/p bilateral lower extremity femoral endarterectomy and patch angioplasty with iliac stenting, 4 compartment fasciotomiesPost op care per vasc surgery  - ck decreasing, 6462 on 12/16, down to 3379 on 12/19. -Vascular surgery follow-up appreciated.  LLE with no palpable or dopplerable pulses, cool foot to touch with mottling of the toes, reportedly worse than before.   - Anticipating she will get an AKA on 12/21 as discussed today with Dr. Donzetta Matters, VVS. -IV heparin discontinued 12/16 and now on aspirin 81 Mg daily and Plavix 75 Mg daily.  See discussion below regarding antiplatelets. -Discussed with Dr. Leonie Man, neurology on 12/19.  From a stroke perspective, patient only needs a single antiplatelet agent.  If vascular  surgery wishes to continue Plavix then he recommends discontinuing aspirin.  Communicated with Dr. Donzetta Matters, [place] to continue Plavix. ASA DC'ed. -12/20 vascular surgery plans LEFT AKA on 12/21 -11/22 groin abscess positive moderate GNR/rare Proteus Mirabilis - 12/22 DC vancomycin, continue Zosyn x 7 days   Acute cerebellar vermis intraparenchymal hemorrhage  -Code stroke CT: Acute intraparenchymal hematoma within the cerebellar vermis measuring 11 cc in volume and mild mass effect upon the fourth ventricle. -Negative stroke per CT/MRI scan.  Small ICH see below  was on aspirin 81 Mg daily + Plavix 75 Mg daily PTA, then here on IV heparin per stroke protocol which was switched to aspirin 81 Mg daily on 12/17.  Remains on Plavix 75 Mg daily as well. As per therapy recommendations, SNF when medically optimized. -Neurology signed off 12/18.   -Please see above regarding discussion with Dr. Leonie Man on 12/19 regarding antiplatelets. -12/20 negative neurological deficit  AMS - 12/23 PAGED to patient's bedside secondary to increased WBC, AMS.  Patient was diaphoretic inappropriately responsive, tachycardic.  After~5 minutes post nebulizer treatment began to answer some questions appropriately but definite change from this morning. - 12/23 obtain repeat CT head: Extension of ICH? - 12/23 obtain EEG, dependent upon findings will consult neurology. -12/23 seizure precautions -12/24 discussed case at length with Dr. Alferd Patee neurology and he is familiar with patient, states low suspicion for seizure activity last night.   Acute respiratory failure with hypoxia/Pulmonary edema/Pneumonia Secondary to acute cerebellar hemorrhagic stroke and postop Extubated 12/14. -12/20 on room air -12/23 acute pulmonary edema: Titrate O2 to maintain SpO2> 93% -12/23 continue Zosyn minimal 7 days.  See bilateral lower extremity ischemia -12/23 flutter valve - 12/23 incentive spirometry - 12/23 DuoNeb QID -12/23 Revefenacin  Maretta Bees) -12/23 DuoNeb QID -12/23 Albumin  50 gx 1  Hx of COPD with tobacco abuse. -triple therapy nebs  -No clinical exacerbation at this time.   Hx of depression, bipolar, schizophrenia, insomnia. - trazodone at night    HTN emergency. -Coreg 12.5 mg BID -Hydralazine PRN -Labetalol PRN -12/20 BP currently controlled  Hx of CAD, chronic HFpEF, HLD, HTN, s/p TAVR. - goal SBP <160 -Antiplatelet management as above.  Cozaar being held due to AKI. - holding lipitor in setting of elevated CK  - Got a couple doses of IV Lasix in ICU. -12/20 echocardiogram does not support diagnosis of HFpEF -Troponins never trended.  Patient currently without CP -Strict in and out +10.7 L - Daily weight     AKI from rhabdomyolysis. -Trend CK  Latest Reference Range & Units 08/09/22 07:42 08/11/22 03:57 08/12/22 04:20 08/13/22 02:18 08/16/22 04:35  CK Total 38 - 234 U/L 6,462 (H) 3,982 (H) 3,379 (H) 2,499 (H) 719 (H)  (  H): Data is abnormally high -12/22 increase LR 128m/hr -12/23 LR stopped this a.m. secondary to increased WOB/PCXR showing pulmonary edema and small effusion see PCXR -12/23 will need to restart fluids later this afternoon at slower rate.  Non-gap metabolic acidosis. -AKI and metabolic acidosis resolved -Resolved.   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 -12/12 hemoglobin A1c= 12.9 - 12/23 increase Semglee 22 units daily - 12/23 increase NovoLog 10 units q 4 hrs - Resistant SSI CBG (last 3)  Recent Labs    08/17/22 0437 08/17/22 0811 08/17/22 1208  GLUCAP 237* 244* 147*      DM neuropathy. -See DM type II   Anemia of critical illness. - f/u CBC - transfuse for hgb <7 -Hemoglobin has dropped from 8.5-7.9 >7.6>7.7 in the absence of overt bleeding.  Follow CBC in a.m. and transfuse if hemoglobin 7 g or less. -12/20 transfuse 2 unit PRBC Lab Results  Component Value Date   HGB 8.7 (L) 08/17/2022   HGB 9.2 (L) 08/16/2022   HGB 10.6 (L) 08/14/2022    HGB 9.0 (L) 08/13/2022   HGB 6.8 (LL) 08/13/2022     Dysphagia: Likely related to hemorrhagic stroke Has left nasal core track with ongoing tube feeds. Failed swallow evaluation on 12/16. As per SLP follow-up, completed FEES and recommended dysphagia to 2 diet with thin liquids on 12/18. -12/20 positive anorexia    Nutritional Status Nutrition Problem: Increased nutrient needs Etiology: wound healing Signs/Symptoms: estimated needs Interventions: Tube feeding, MVI  Stage II sacral decubitus ulcer Pressure Injury 08/04/22 Sacrum Stage 2 -  Partial thickness loss of dermis presenting as a shallow open injury with a red, pink wound bed without slough. (Active)  08/04/22 0100  Location: Sacrum  Location Orientation:   Staging: Stage 2 -  Partial thickness loss of dermis presenting as a shallow open injury with a red, pink wound bed without slough.  Wound Description (Comments):   Present on Admission: Yes  Dressing Type Foam - Lift dressing to assess site every shift 08/17/22 1000        Mobility Assessment (last 72 hours)     Mobility Assessment     Row Name 08/17/22 1000 08/16/22 2200 08/16/22 1150 08/15/22 2112 08/15/22 2100   Does patient have an order for bedrest or is patient medically unstable No - Continue assessment No - Continue assessment No - Continue assessment No - Continue assessment No - Continue assessment   What is the highest level of mobility based on the progressive mobility assessment? Level 1 (Bedfast) - Unable to balance while sitting on edge of bed Level 2 (Chairfast) - Balance while sitting on edge of bed and cannot stand Level 2 (Chairfast) - Balance while sitting on edge of bed and cannot stand Level 1 (Bedfast) - Unable to balance while sitting on edge of bed Level 1 (Bedfast) - Unable to balance while sitting on edge of bed   Is the above level different from baseline mobility prior to current illness? Yes - Recommend PT order Yes - Recommend PT order  Yes - Recommend PT order Yes - Recommend PT order Yes - Recommend PT order    [place] 08/15/22 1200           What is the highest level of mobility based on the progressive mobility assessment? Level 1 (Bedfast) - Unable to balance while sitting on edge of bed                  Time:  50 minutes       Care during the described time interval was provided by me .  I have reviewed this patient's available data, including medical history, events of note, physical examination, and all test results as part of my evaluation.

## 2022-08-18 DIAGNOSIS — K612 Anorectal abscess: Secondary | ICD-10-CM | POA: Diagnosis not present

## 2022-08-18 DIAGNOSIS — I614 Nontraumatic intracerebral hemorrhage in cerebellum: Secondary | ICD-10-CM | POA: Diagnosis not present

## 2022-08-18 DIAGNOSIS — J810 Acute pulmonary edema: Secondary | ICD-10-CM | POA: Diagnosis not present

## 2022-08-18 DIAGNOSIS — L89152 Pressure ulcer of sacral region, stage 2: Secondary | ICD-10-CM | POA: Diagnosis not present

## 2022-08-18 LAB — CK TOTAL AND CKMB (NOT AT ARMC)
CK, MB: 1.6 ng/mL (ref 0.5–5.0)
Relative Index: 0.9 (ref 0.0–2.5)
Total CK: 179 U/L (ref 38–234)

## 2022-08-18 LAB — GLUCOSE, CAPILLARY
Glucose-Capillary: 116 mg/dL — ABNORMAL HIGH (ref 70–99)
Glucose-Capillary: 141 mg/dL — ABNORMAL HIGH (ref 70–99)
Glucose-Capillary: 142 mg/dL — ABNORMAL HIGH (ref 70–99)
Glucose-Capillary: 179 mg/dL — ABNORMAL HIGH (ref 70–99)
Glucose-Capillary: 196 mg/dL — ABNORMAL HIGH (ref 70–99)
Glucose-Capillary: 248 mg/dL — ABNORMAL HIGH (ref 70–99)

## 2022-08-18 LAB — COMPREHENSIVE METABOLIC PANEL
ALT: 21 U/L (ref 0–44)
AST: 19 U/L (ref 15–41)
Albumin: 1.8 g/dL — ABNORMAL LOW (ref 3.5–5.0)
Alkaline Phosphatase: 131 U/L — ABNORMAL HIGH (ref 38–126)
Anion gap: 11 (ref 5–15)
BUN: 29 mg/dL — ABNORMAL HIGH (ref 8–23)
CO2: 25 mmol/L (ref 22–32)
Calcium: 8.4 mg/dL — ABNORMAL LOW (ref 8.9–10.3)
Chloride: 101 mmol/L (ref 98–111)
Creatinine, Ser: 0.83 mg/dL (ref 0.44–1.00)
GFR, Estimated: >60 mL/min (ref >60)
Glucose, Bld: 236 mg/dL — ABNORMAL HIGH (ref 70–99)
Potassium: 4.1 mmol/L (ref 3.5–5.1)
Sodium: 137 mmol/L (ref 135–145)
Total Bilirubin: 0.6 mg/dL (ref 0.3–1.2)
Total Protein: 5.6 g/dL — ABNORMAL LOW (ref 6.5–8.1)

## 2022-08-18 LAB — CBC WITH DIFFERENTIAL/PLATELET
Abs Immature Granulocytes: 0.09 10*3/uL — ABNORMAL HIGH (ref 0.00–0.07)
Basophils Absolute: 0.1 10*3/uL (ref 0.0–0.1)
Basophils Relative: 1 %
Eosinophils Absolute: 0.2 10*3/uL (ref 0.0–0.5)
Eosinophils Relative: 2 %
HCT: 26.7 % — ABNORMAL LOW (ref 36.0–46.0)
Hemoglobin: 8.3 g/dL — ABNORMAL LOW (ref 12.0–15.0)
Immature Granulocytes: 1 %
Lymphocytes Relative: 12 %
Lymphs Abs: 1.3 10*3/uL (ref 0.7–4.0)
MCH: 30.2 pg (ref 26.0–34.0)
MCHC: 31.1 g/dL (ref 30.0–36.0)
MCV: 97.1 fL (ref 80.0–100.0)
Monocytes Absolute: 0.7 10*3/uL (ref 0.1–1.0)
Monocytes Relative: 7 %
Neutro Abs: 8.1 10*3/uL — ABNORMAL HIGH (ref 1.7–7.7)
Neutrophils Relative %: 77 %
Platelets: 522 10*3/uL — ABNORMAL HIGH (ref 150–400)
RBC: 2.75 MIL/uL — ABNORMAL LOW (ref 3.87–5.11)
RDW: 15.3 % (ref 11.5–15.5)
WBC: 10.3 10*3/uL (ref 4.0–10.5)
nRBC: 0 % (ref 0.0–0.2)

## 2022-08-18 LAB — PHOSPHORUS: Phosphorus: 2.5 mg/dL (ref 2.5–4.6)

## 2022-08-18 LAB — MAGNESIUM: Magnesium: 2 mg/dL (ref 1.7–2.4)

## 2022-08-18 MED ORDER — CEFAZOLIN SODIUM-DEXTROSE 2-4 GM/100ML-% IV SOLN
2.0000 g | Freq: Three times a day (TID) | INTRAVENOUS | Status: DC
  Administered 2022-08-18 – 2022-09-16 (×85): 2 g via INTRAVENOUS
  Filled 2022-08-18 (×89): qty 100

## 2022-08-18 MED ORDER — METRONIDAZOLE 500 MG/100ML IV SOLN
500.0000 mg | Freq: Four times a day (QID) | INTRAVENOUS | Status: DC
  Administered 2022-08-19 – 2022-08-20 (×6): 500 mg via INTRAVENOUS
  Filled 2022-08-18 (×6): qty 100

## 2022-08-18 NOTE — Progress Notes (Signed)
   VASCULAR SURGERY ASSESSMENT & PLAN:   OPEN GROIN WOUNDS: Patient is undergone bilateral iliofemoral embolectomies and lower extremity embolectomies with bilateral common iliac artery stents.  She also has a stent in the right external iliac artery and left external iliac artery.  She has open wounds in both groins with a VAC on each groin.  The bovine pericardial patch is exposed on the right and were using white foam on top of this.  Continue meticulous wound care.  Her next VAC changes Tuesday.   POD 2 LEFT AKA: Her amputation site is healing nicely.  Continue daily dressing changes.   NUTRITION: She is getting tube feeds.   ID: She is on IV Zosyn.  Her groin wound grew moderate E. coli and rare Proteus mirabilis.   DVT PROPHYLAXIS: She is getting subcu heparin.    SUBJECTIVE:   No complaints this morning.  PHYSICAL EXAM:   Vitals:   08/18/22 0748 08/18/22 0754 08/18/22 0756 08/18/22 0800  BP: (!) 142/49     Pulse: 85     Resp: 17     Temp: 98.2 F (36.8 C)     TempSrc: Oral     SpO2: 96% 98% 99% 99%  Weight:      Height:       Her vacs both have a good seal. Her incisions on the right leg look fine. She has good Doppler signals in the right foot.  LABS:   Lab Results  Component Value Date   WBC 10.3 08/18/2022   HGB 8.3 (L) 08/18/2022   HCT 26.7 (L) 08/18/2022   MCV 97.1 08/18/2022   PLT 522 (H) 08/18/2022    CBG (last 3)  Recent Labs    08/17/22 2357 08/18/22 0439 08/18/22 0750  GLUCAP 224* 248* 196*    PROBLEM LIST:    Principal Problem:   ICH (intracerebral hemorrhage) (HCC) Active Problems:   Abscess of anal and rectal regions   Decubitus ulcer of sacral region, stage 2 (HCC)   Pulmonary edema   CURRENT MEDS:    arformoterol  15 mcg Nebulization BID   budesonide (PULMICORT) nebulizer solution  0.5 mg Nebulization BID   carvedilol  12.5 mg Per Tube BID WC   Chlorhexidine Gluconate Cloth  6 each Topical Q0600   clopidogrel  75 mg Oral  Daily   docusate  100 mg Per Tube BID   escitalopram  10 mg Per Tube Daily   feeding supplement (PIVOT 1.5 CAL)  1,000 mL Per Tube Q24H   heparin injection (subcutaneous)  5,000 Units Subcutaneous Q8H   insulin aspart  0-20 Units Subcutaneous Q4H   insulin aspart  10 Units Subcutaneous Q4H   insulin glargine-yfgn  22 Units Subcutaneous Daily   ipratropium-albuterol  3 mL Nebulization TID   multivitamin with minerals  1 tablet Per Tube Daily   nicotine  14 mg Transdermal Daily   mouth rinse  15 mL Mouth Rinse Q4H   oxyCODONE  10 mg Oral Q12H   pantoprazole (PROTONIX) IV  40 mg Intravenous Q24H   polyethylene glycol  17 g Per Tube Daily   revefenacin  175 mcg Nebulization Daily   senna-docusate  1 tablet Per Tube BID   sodium chloride flush  10-40 mL Intracatheter Q12H    Deitra Mayo Office: [phone] 08/18/2022

## 2022-08-18 NOTE — Progress Notes (Signed)
Heather [name] GBT:[id number] DOB: 07/11/1951 DOA: 08/04/2022 PCP: [name], FNP   Subj: 71 year old female with PMHx of DM with neuropathy, HTN, HLD, COPD, GERD, PAD, s/p TAVR, chronic diastolic CHF, anxiety/depression/schizophrenia, tobacco abuse,   Presented to ER with sudden onset of dizziness, blurred vision, headache, nausea and vomiting. BP in EMS 209/76.  She was admitted to ICU for hemorrhagic stroke and bilateral lower extremity acute on chronic ischemia.  Vascular surgery consulted and s/p bilateral lower extremity femoral endarterectomy and patch angioplasty with iliac stenting, 4 compartment fasciotomies for acute on chronic lower extremity ischemia.  Postop remained on vent and Cleviprex.  Transferred to floor and TRH on 12/17.  Neurology and vascular surgery following.  Vascular surgery plans left lower extremity amputation on 12/21.    Obj:  12/25 afebrile overnight A/O x 4, wound VAC with airleak again, left groin area    Objective: VITAL SIGNS: Temp: 98.2 F (36.8 C) (12/25 1333) Temp Source: Oral (12/25 1333) BP: 134/58 (12/25 1333) Pulse Rate: 85 (12/25 1333) SPO2; 97% 12/25 FIO2: 2 L/min O2   Intake/Output Summary (Last 24 hours) at 08/18/2022 1407 Last data filed at 08/18/2022 0908 Gross per 24 hour  Intake 720 ml  Output 900 ml  Net -180 ml     Physical Exam:  General: A/O x 4, positive No acute respiratory distress Eyes: negative scleral hemorrhage, negative anisocoria, negative icterus ENT: Negative Runny nose, negative gingival bleeding, Neck:  Negative scars, masses, torticollis, lymphadenopathy, JVD Lungs: diffuse decreased breath sounds, diffuse wheezing  Cardiovascular: Regular rate and rhythm without murmur gallop or rub normal S1 and S2 Abdomen: OBESE, negative abdominal pain, nondistended, positive soft, bowel sounds, no rebound, no ascites, no appreciable mass Extremities: bilateral groin wound vacs in place: Air leak, left groin.  LEFT  AKA clean and wraped, did not take down manage Skin: Negative rashes, lesions, ulcers Psychiatric:  Negative depression, negative anxiety, negative fatigue, negative mania  Central nervous system:  Cranial nerves II through XII intact, tongue/uvula midline, all extremities muscle strength 4/5, sensation intact throughout, finger nose finger bilateral within normal limits, quick finger touch bilateral within normal limits,, negative dysarthria, negative expressive aphasia, negative receptive aphasia.  Physical Exam:    Mobility Assessment (last 72 hours)     Mobility Assessment     Row Name 08/18/22 1125 08/17/22 2000 08/17/22 1000 08/16/22 2200 08/16/22 1150   Does patient have an order for bedrest or is patient medically unstable No - Continue assessment No - Continue assessment No - Continue assessment No - Continue assessment No - Continue assessment   What is the highest level of mobility based on the progressive mobility assessment? Level 1 (Bedfast) - Unable to balance while sitting on edge of bed Level 1 (Bedfast) - Unable to balance while sitting on edge of bed Level 1 (Bedfast) - Unable to balance while sitting on edge of bed Level 2 (Chairfast) - Balance while sitting on edge of bed and cannot stand Level 2 (Chairfast) - Balance while sitting on edge of bed and cannot stand   Is the above level different from baseline mobility prior to current illness? Yes - Recommend PT order -- Yes - Recommend PT order Yes - Recommend PT order Yes - Recommend PT order    [place] Name 08/15/22 2112 08/15/22 2100         Does patient have an order for bedrest or is patient medically unstable No - Continue assessment No - Continue assessment  What is the highest level of mobility based on the progressive mobility assessment? Level 1 (Bedfast) - Unable to balance while sitting on edge of bed Level 1 (Bedfast) - Unable to balance while sitting on edge of bed      Is the above level different from  baseline mobility prior to current illness? Yes - Recommend PT order Yes - Recommend PT order                 DVT prophylaxis: Subcu heparin Code Status: Full Family Communication:  Status is: Inpatient    Dispo: The patient is from: Home              Anticipated d/c is to: Home              Anticipated d/c date is: > 3 days              Patient currently is not medically stable to d/c.    Procedures/Significant Events: CTA head and neck: Negative for large vessel occlusion.  70% stenosis of innominate artery origin. Follow-up CT head 12/12: Stable intraparenchymal hematoma within the cerebellar vermis.  Stable moderate mass effect upon the fourth ventricle. MRI brain stable left cerebellar ICH with extension to fourth ventricle.  No hydrocephalus. Repeat CT head 12/14: Stable hematoma and no hydrocephalus. TTE: LVEF 60-65%, LDL 34, A1c 12.9.  12/11 Ehocardiogram  Left Ventricle: Left ventricular ejection fraction, by estimation, is 60  to 65%. The left ventricle has normal function. The left ventricle has no  regional wall motion abnormalities. The left ventricular internal cavity  size was normal in size.  Suboptimal image quality limits for assessment of left ventricular  hypertrophy. Left ventricular diastolic parameters are indeterminate.   Aortic Valve: The aortic valve has been repaired/replaced. Aortic valve  regurgitation is trivial. Aortic valve mean gradient measures 9.2 mmHg.  Aortic valve peak gradient measures 17.8 mmHg. Aortic valve area, by VTI  measures 2.35 cm. There is a 23 mm  Sapien prosthetic, stented (TAVR) valve present in the aortic position.  Procedure Date: 09/2016. Echo findings are consistent with normal structure  and function of the aortic valve prosthesis.  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  -Mild regional mass effect without upstream hydrocephalus. 2. No new acute intracranial pathology 12/23 PCXR  Mild interstitial pulmonary edema. 2. Stable support lines and tubes. The right internal jugular central venous catheter appears kinked in its extravascular component.    Consultants:  Vascular surgery Dr. Servando Snare   Cultures 12/21 Groin positive moderate GNR/rare Proteus Mirabilis   Antimicrobials: Anti-infectives (From admission, onward)    Start     Dose/Rate Route Frequency Ordered Stop   08/18/22 2300  ceFAZolin (ANCEF) IVPB 2g/100 mL premix        2 g 200 mL/hr over 30 Minutes Intravenous Every 8 hours 08/18/22 1455     08/18/22 2300  metroNIDAZOLE (FLAGYL) IVPB 500 mg        500 mg 100 mL/hr over 60 Minutes Intravenous Every 6 hours 08/18/22 1455     08/15/22 1100  vancomycin (VANCOCIN) IVPB 1000 mg/200  mL premix  Status:  Discontinued        1,000 mg 200 mL/hr over 60 Minutes Intravenous Every 24 hours 08/14/22 1006 08/15/22 1307   08/14/22 1400  piperacillin-tazobactam (ZOSYN) IVPB 3.375 g  Status:  Discontinued        3.375 g 12.5 mL/hr over 240 Minutes Intravenous Every 8 hours 08/14/22 1006 08/18/22 1455   08/14/22 1100  vancomycin (VANCOREADY) IVPB 1500 mg/300 mL        1,500 mg 150 mL/hr over 120 Minutes Intravenous  Once 08/14/22 1006 08/14/22 1502   08/04/22 1045  ceFAZolin (ANCEF) IVPB 2g/100 mL premix        2 g 200 mL/hr over 30 Minutes Intravenous On call to O.R. 08/04/22 0958 08/04/22 1100           Assessment & Plan: Covid vaccination;   Principal Problem:   ICH (intracerebral hemorrhage) ([place]) Active Problems:   Abscess of anal and rectal regions   Decubitus ulcer of sacral region, stage 2 ([place])   Pulmonary  edema  Bilateral lower extremity acute on chronic ischemia: - s/p bilateral lower extremity femoral endarterectomy and patch angioplasty with iliac stenting, 4 compartment fasciotomiesPost op care per vasc surgery  - ck decreasing, 6462 on 12/16, down to 3379 on 12/19. -Vascular surgery follow-up appreciated.  LLE with no palpable or dopplerable pulses, cool foot to touch with mottling of the toes, reportedly worse than before.   - Anticipating she will get an AKA on 12/21 as discussed today with Dr. Donzetta Matters, [facility]. -IV heparin discontinued 12/16 and now on aspirin 81 Mg daily and Plavix 75 Mg daily.  See discussion below regarding antiplatelets. -Discussed with Dr. Leonie Man, neurology on 12/19.  From a stroke perspective, patient only needs a single antiplatelet agent.  If vascular surgery wishes to continue Plavix then he recommends discontinuing aspirin.  Communicated with Dr. Donzetta Matters, [place] to continue Plavix. ASA DC'ed. -12/20 vascular surgery plans LEFT AKA on 12/21 -11/22 groin abscess positive moderate GNR/rare Proteus Mirabilis - 12/22 DC vancomycin, continue Zosyn x 7 days -12/25 narrowed antibiotic coverage see above   Acute cerebellar vermis intraparenchymal hemorrhage  -Code stroke CT: Acute intraparenchymal hematoma within the cerebellar vermis measuring 11 cc in volume and mild mass effect upon the fourth ventricle. -Negative stroke per CT/MRI scan.  Small ICH see below  was on aspirin 81 Mg daily + Plavix 75 Mg daily PTA, then here on IV heparin per stroke protocol which was switched to aspirin 81 Mg daily on 12/17.  Remains on Plavix 75 Mg daily as well. As per therapy recommendations, SNF when medically optimized. -Neurology signed off 12/18.   -Please see above regarding discussion with Dr. Leonie Man on 12/19 regarding antiplatelets. -12/20 negative neurological deficit -12/25 stable  AMS - 12/23 PAGED to patient's bedside secondary to increased WBC, AMS.  Patient was diaphoretic  inappropriately responsive, tachycardic.  After~5 minutes post nebulizer treatment began to answer some questions appropriately but definite change from this morning. - 12/23 obtain repeat CT head: Extension of ICH? - 12/23 obtain EEG, dependent upon findings will consult neurology. -12/23 seizure precautions -12/24 discussed case at length with Dr. Alferd Patee neurology and he is familiar with patient, states low suspicion for seizure activity last night. -12/25 resolved   Acute respiratory failure with hypoxia/Pulmonary edema/Pneumonia Secondary to acute cerebellar hemorrhagic stroke and postop Extubated 12/14. -12/20 on room air -12/23 acute pulmonary edema: Titrate O2 to maintain SpO2> 93% -12/23 continue Zosyn minimal 7 days.  See bilateral  lower extremity ischemia -12/23 flutter valve - 12/23 incentive spirometry - 12/23 DuoNeb QID -12/23 Revefenacin [place] Bees) -12/23 DuoNeb QID -12/23 Albumin  50 gx 1 -12/25 stable  Hx of COPD with tobacco abuse. -triple therapy nebs  -No clinical exacerbation at this time.   Hx of depression, bipolar, schizophrenia, insomnia. - trazodone at night    HTN emergency. -Coreg 12.5 mg BID -Hydralazine PRN -Labetalol PRN -12/20 BP currently controlled  Hx of CAD, chronic HFpEF, HLD, HTN, s/p TAVR. - goal SBP <160 -Antiplatelet management as above.  Cozaar being held due to AKI. - holding lipitor in setting of elevated CK  - Got a couple doses of IV Lasix in ICU. -12/20 echocardiogram does not support diagnosis of HFpEF -Troponins never trended.  Patient currently without CP -Strict in and out +13.4 L - Daily weight     AKI from rhabdomyolysis. -Trend CK  Latest Reference Range & Units 08/12/22 04:20 08/13/22 02:18 08/16/22 04:35 08/17/22 00:42 08/18/22 05:13  CK Total 38 - 234 U/L 3,379 (H) 2,499 (H) 719 (H) 442 (H) 179  (H): Data is abnormally high -12/22 increase LR 174m/hr -12/23 LR stopped this a.m. secondary to increased WOB/PCXR  showing pulmonary edema and small effusion see PCXR -12/23 will need to restart fluids later this afternoon at slower rate. -12/25 resolved  Non-gap metabolic acidosis. -AKI and metabolic acidosis resolved -Resolved.   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 -12/12 hemoglobin A1c= 12.9 - 12/23 increase Semglee 22 units daily - 12/23 increase NovoLog 10 units q 4 hrs - Resistant SSI CBG (last 3)  Recent Labs    08/18/22 0439 08/18/22 0750 08/18/22 1155  GLUCAP 248* 196* 116*      DM neuropathy. -See DM type II   Anemia of critical illness. - f/u CBC - transfuse for hgb <7 -Hemoglobin has dropped from 8.5-7.9 >7.6>7.7 in the absence of overt bleeding.  Follow CBC in a.m. and transfuse if hemoglobin 7 g or less. -12/20 transfuse 2 unit PRBC Lab Results  Component Value Date   HGB 8.3 (L) 08/18/2022   HGB 8.7 (L) 08/17/2022   HGB 9.2 (L) 08/16/2022   HGB 10.6 (L) 08/14/2022   HGB 9.0 (L) 08/13/2022     Dysphagia: Likely related to hemorrhagic stroke Has left nasal core track with ongoing tube feeds. Failed swallow evaluation on 12/16. As per SLP follow-up, completed FEES and recommended dysphagia to 2 diet with thin liquids on 12/18. -12/20 positive anorexia    Nutritional Status Nutrition Problem: Increased nutrient needs Etiology: wound healing Signs/Symptoms: estimated needs Interventions: Tube feeding, MVI  Stage II sacral decubitus ulcer Pressure Injury 08/04/22 Sacrum Stage 2 -  Partial thickness loss of dermis presenting as a shallow open injury with a red, pink wound bed without slough. (Active)  08/04/22 0100  Location: Sacrum  Location Orientation:   Staging: Stage 2 -  Partial thickness loss of dermis presenting as a shallow open injury with a red, pink wound bed without slough.  Wound Description (Comments):   Present on Admission: Yes  Dressing Type Foam - Lift dressing to assess site every shift 08/18/22 1125        Mobility  Assessment (last 72 hours)     Mobility Assessment     Row Name 08/18/22 1125 08/17/22 2000 08/17/22 1000 08/16/22 2200 08/16/22 1150   Does patient have an order for bedrest or is patient medically unstable No - Continue assessment No - Continue assessment No - Continue assessment No - Continue  assessment No - Continue assessment   What is the highest level of mobility based on the progressive mobility assessment? Level 1 (Bedfast) - Unable to balance while sitting on edge of bed Level 1 (Bedfast) - Unable to balance while sitting on edge of bed Level 1 (Bedfast) - Unable to balance while sitting on edge of bed Level 2 (Chairfast) - Balance while sitting on edge of bed and cannot stand Level 2 (Chairfast) - Balance while sitting on edge of bed and cannot stand   Is the above level different from baseline mobility prior to current illness? Yes - Recommend PT order -- Yes - Recommend PT order Yes - Recommend PT order Yes - Recommend PT order    [place] Name 08/15/22 2112 08/15/22 2100         Does patient have an order for bedrest or is patient medically unstable No - Continue assessment No - Continue assessment      What is the highest level of mobility based on the progressive mobility assessment? Level 1 (Bedfast) - Unable to balance while sitting on edge of bed Level 1 (Bedfast) - Unable to balance while sitting on edge of bed      Is the above level different from baseline mobility prior to current illness? Yes - Recommend PT order Yes - Recommend PT order                 Time: 50 minutes       Care during the described time interval was provided by me .  I have reviewed this patient's available data, including medical history, events of note, physical examination, and all test results as part of my evaluation.

## 2022-08-18 NOTE — Progress Notes (Signed)
Right IJ assessed per RN request due to no blood return and difficulty flushing distal port.  Port found to be as described.  Brown port was flushed with NS and cap changed with no resolution.  (Patient did have to turn her head to the left to be able to flush port)  CXR on 12-23 shows kinked catheter tubing.  On assessment no external catheter is noted, which means that this kink is internal and cannot be fixed without pulling the catheter out several centimeters.  Renita Papa, RN of findings and recommendation for patient to have PICC placed if she is still in need of prolonged central access.  Currently patient is able to get IV medications and labs via proximal port.

## 2022-08-18 NOTE — Progress Notes (Signed)
RT called to bedside per RN due to SOB. Pt under mild distress and PRN tx given. O2 maintained at 97%. No additional action performed. Will continue to monitor

## 2022-08-18 NOTE — Progress Notes (Signed)
Pharmacy Antibiotic Note  Heather [name] is a 71 y.o. female admitted on 08/04/2022 with bilateral lower extremity acute limb ischemia. Patient is now s/p L AKA on 12/21. Scr 0.76 and at baseline. CrCl 57.1 ml/min. WBC 21.4 and trending up in setting of recent surgery. Broad-spectrum antibiotics started post-AKA with plans for likely more definitive surgical repair next week for bilateral groin wounds. Cultures revealed bacteroides ovatus, bacteroides stercoris, pan (s) E. Coli and pan (s) proteus mirabilis. Pharmacy consulted to de-escalate to cefazolin and flagyl.    Plan: Discontinue Zosyn 3.375 mg IV q8 hours Start cefazolin 2gQ8h and flagyl '500mg'$  Q8h Follow future surgical plans, monitor renal function and resolution of s/s of infection  Height: 5' (152.4 cm) Weight: 71.6 kg (157 lb 13.6 oz) IBW/kg (Calculated) : 45.5  Temp (24hrs), Avg:98.2 F (36.8 C), Min:97.5 F (36.4 C), Max:99 F (37.2 C)  Recent Labs  Lab 08/12/22 0420 08/13/22 0218 08/14/22 0120 08/16/22 0435 08/17/22 0042 08/18/22 0513  WBC 19.6* 21.2* 21.4* 17.0* 16.0* 10.3  CREATININE 0.74 0.76  --  0.83 0.83 0.83     Estimated Creatinine Clearance: 56.5 mL/min (by C-G formula based on SCr of 0.83 mg/dL).    Allergies  Allergen Reactions   Iodinated Contrast Media Hives   Sulfa Antibiotics Hives   Shellfish Allergy Nausea And Vomiting, Rash and Other (See Comments)    Scallops specifically cause VOMITING     Antimicrobials this admission: Cefazolin 12/21 x1 Vancomycin 12/21 >> 12/22 Zosyn 12/21 >> 12/25 Cefepime 12/25 >> Flagyl 12/25 >>  Microbiology results: 12/21 Wound Cx:  bacteroides ovatus, bacteroides stercoris, pan (s) E. Coli and pan (s) proteus mirabilis 12/11 MRSA PCR: negative  Thank you for allowing pharmacy to be a part of this patient's care.  Titus Dubin, PharmD PGY1 Pharmacy Resident 08/18/2022 2:44 PM

## 2022-08-19 ENCOUNTER — Inpatient Hospital Stay (HOSPITAL_COMMUNITY): Payer: Medicare HMO

## 2022-08-19 DIAGNOSIS — I614 Nontraumatic intracerebral hemorrhage in cerebellum: Secondary | ICD-10-CM | POA: Diagnosis not present

## 2022-08-19 DIAGNOSIS — K612 Anorectal abscess: Secondary | ICD-10-CM | POA: Diagnosis not present

## 2022-08-19 DIAGNOSIS — J810 Acute pulmonary edema: Secondary | ICD-10-CM | POA: Diagnosis not present

## 2022-08-19 DIAGNOSIS — L89152 Pressure ulcer of sacral region, stage 2: Secondary | ICD-10-CM | POA: Diagnosis not present

## 2022-08-19 LAB — COMPREHENSIVE METABOLIC PANEL
ALT: 19 U/L (ref 0–44)
AST: 18 U/L (ref 15–41)
Albumin: 1.7 g/dL — ABNORMAL LOW (ref 3.5–5.0)
Alkaline Phosphatase: 129 U/L — ABNORMAL HIGH (ref 38–126)
Anion gap: 6 (ref 5–15)
BUN: 29 mg/dL — ABNORMAL HIGH (ref 8–23)
CO2: 29 mmol/L (ref 22–32)
Calcium: 8.4 mg/dL — ABNORMAL LOW (ref 8.9–10.3)
Chloride: 104 mmol/L (ref 98–111)
Creatinine, Ser: 0.73 mg/dL (ref 0.44–1.00)
GFR, Estimated: >60 mL/min (ref >60)
Glucose, Bld: 168 mg/dL — ABNORMAL HIGH (ref 70–99)
Potassium: 4.6 mmol/L (ref 3.5–5.1)
Sodium: 139 mmol/L (ref 135–145)
Total Bilirubin: 0.6 mg/dL (ref 0.3–1.2)
Total Protein: 5.4 g/dL — ABNORMAL LOW (ref 6.5–8.1)

## 2022-08-19 LAB — GLUCOSE, CAPILLARY
Glucose-Capillary: 133 mg/dL — ABNORMAL HIGH (ref 70–99)
Glucose-Capillary: 135 mg/dL — ABNORMAL HIGH (ref 70–99)
Glucose-Capillary: 146 mg/dL — ABNORMAL HIGH (ref 70–99)
Glucose-Capillary: 177 mg/dL — ABNORMAL HIGH (ref 70–99)
Glucose-Capillary: 177 mg/dL — ABNORMAL HIGH (ref 70–99)
Glucose-Capillary: 224 mg/dL — ABNORMAL HIGH (ref 70–99)
Glucose-Capillary: 30 mg/dL — CL (ref 70–99)

## 2022-08-19 LAB — CK TOTAL AND CKMB (NOT AT ARMC)
CK, MB: 1.8 ng/mL (ref 0.5–5.0)
Relative Index: 1.2 (ref 0.0–2.5)
Total CK: 151 U/L (ref 38–234)

## 2022-08-19 LAB — CBC WITH DIFFERENTIAL/PLATELET
Abs Immature Granulocytes: 0.13 10*3/uL — ABNORMAL HIGH (ref 0.00–0.07)
Basophils Absolute: 0.1 10*3/uL (ref 0.0–0.1)
Basophils Relative: 1 %
Eosinophils Absolute: 0.2 10*3/uL (ref 0.0–0.5)
Eosinophils Relative: 2 %
HCT: 26.1 % — ABNORMAL LOW (ref 36.0–46.0)
Hemoglobin: 8 g/dL — ABNORMAL LOW (ref 12.0–15.0)
Immature Granulocytes: 1 %
Lymphocytes Relative: 12 %
Lymphs Abs: 1.1 10*3/uL (ref 0.7–4.0)
MCH: 29.5 pg (ref 26.0–34.0)
MCHC: 30.7 g/dL (ref 30.0–36.0)
MCV: 96.3 fL (ref 80.0–100.0)
Monocytes Absolute: 1 10*3/uL (ref 0.1–1.0)
Monocytes Relative: 11 %
Neutro Abs: 6.7 10*3/uL (ref 1.7–7.7)
Neutrophils Relative %: 73 %
Platelets: 574 10*3/uL — ABNORMAL HIGH (ref 150–400)
RBC: 2.71 MIL/uL — ABNORMAL LOW (ref 3.87–5.11)
RDW: 15.1 % (ref 11.5–15.5)
WBC: 9.1 10*3/uL (ref 4.0–10.5)
nRBC: 0 % (ref 0.0–0.2)

## 2022-08-19 LAB — MAGNESIUM: Magnesium: 2.1 mg/dL (ref 1.7–2.4)

## 2022-08-19 LAB — PHOSPHORUS: Phosphorus: 3.6 mg/dL (ref 2.5–4.6)

## 2022-08-19 MED ORDER — INSULIN ASPART 100 UNIT/ML IJ SOLN
0.0000 [IU] | INTRAMUSCULAR | Status: DC
  Administered 2022-08-19: 5 [IU] via SUBCUTANEOUS
  Administered 2022-08-19: 3 [IU] via SUBCUTANEOUS
  Administered 2022-08-20: 5 [IU] via SUBCUTANEOUS
  Administered 2022-08-20: 3 [IU] via SUBCUTANEOUS
  Administered 2022-08-20: 8 [IU] via SUBCUTANEOUS
  Administered 2022-08-20: 3 [IU] via SUBCUTANEOUS
  Administered 2022-08-21: 5 [IU] via SUBCUTANEOUS
  Administered 2022-08-21: 3 [IU] via SUBCUTANEOUS
  Administered 2022-08-21 – 2022-08-22 (×4): 5 [IU] via SUBCUTANEOUS
  Administered 2022-08-22 (×2): 3 [IU] via SUBCUTANEOUS
  Administered 2022-08-23: 5 [IU] via SUBCUTANEOUS
  Administered 2022-08-23: 2 [IU] via SUBCUTANEOUS
  Administered 2022-08-23: 8 [IU] via SUBCUTANEOUS
  Administered 2022-08-23: 6 [IU] via SUBCUTANEOUS
  Administered 2022-08-24 (×2): 5 [IU] via SUBCUTANEOUS
  Administered 2022-08-24: 2 [IU] via SUBCUTANEOUS
  Administered 2022-08-24 – 2022-08-25 (×3): 8 [IU] via SUBCUTANEOUS
  Administered 2022-08-25: 11 [IU] via SUBCUTANEOUS
  Administered 2022-08-25: 2 [IU] via SUBCUTANEOUS
  Administered 2022-08-25: 5 [IU] via SUBCUTANEOUS
  Administered 2022-08-25 – 2022-08-26 (×2): 8 [IU] via SUBCUTANEOUS
  Administered 2022-08-26: 3 [IU] via SUBCUTANEOUS
  Administered 2022-08-26: 8 [IU] via SUBCUTANEOUS
  Administered 2022-08-26: 3 [IU] via SUBCUTANEOUS
  Administered 2022-08-27: 8 [IU] via SUBCUTANEOUS
  Administered 2022-08-27: 11 [IU] via SUBCUTANEOUS
  Administered 2022-08-27: 5 [IU] via SUBCUTANEOUS
  Administered 2022-08-27: 3 [IU] via SUBCUTANEOUS
  Administered 2022-08-28 (×2): 5 [IU] via SUBCUTANEOUS
  Administered 2022-08-28: 8 [IU] via SUBCUTANEOUS
  Administered 2022-08-28: 5 [IU] via SUBCUTANEOUS
  Administered 2022-08-29: 3 [IU] via SUBCUTANEOUS
  Administered 2022-08-30: 2 [IU] via SUBCUTANEOUS
  Administered 2022-08-31: 3 [IU] via SUBCUTANEOUS
  Administered 2022-09-01: 2 [IU] via SUBCUTANEOUS
  Administered 2022-09-01: 3 [IU] via SUBCUTANEOUS
  Administered 2022-09-01: 5 [IU] via SUBCUTANEOUS
  Administered 2022-09-01: 2 [IU] via SUBCUTANEOUS
  Administered 2022-09-02: 5 [IU] via SUBCUTANEOUS
  Administered 2022-09-02: 2 [IU] via SUBCUTANEOUS
  Administered 2022-09-02: 4 [IU] via SUBCUTANEOUS
  Administered 2022-09-03 (×2): 2 [IU] via SUBCUTANEOUS
  Administered 2022-09-03: 5 [IU] via SUBCUTANEOUS
  Administered 2022-09-03: 3 [IU] via SUBCUTANEOUS
  Administered 2022-09-03: 2 [IU] via SUBCUTANEOUS
  Administered 2022-09-04: 5 [IU] via SUBCUTANEOUS
  Administered 2022-09-04 – 2022-09-05 (×2): 2 [IU] via SUBCUTANEOUS
  Administered 2022-09-05: 3 [IU] via SUBCUTANEOUS
  Administered 2022-09-05: 2 [IU] via SUBCUTANEOUS
  Administered 2022-09-05 – 2022-09-06 (×4): 3 [IU] via SUBCUTANEOUS
  Administered 2022-09-07 – 2022-09-08 (×3): 2 [IU] via SUBCUTANEOUS
  Administered 2022-09-08: 15 [IU] via SUBCUTANEOUS

## 2022-08-19 MED ORDER — INSULIN ASPART 100 UNIT/ML IJ SOLN
5.0000 [IU] | INTRAMUSCULAR | Status: DC
  Administered 2022-08-19 – 2022-08-23 (×14): 5 [IU] via SUBCUTANEOUS

## 2022-08-19 MED ORDER — DEXTROSE 50 % IV SOLN
INTRAVENOUS | Status: AC
  Administered 2022-08-19: 50 mL
  Filled 2022-08-19: qty 50

## 2022-08-19 NOTE — Progress Notes (Addendum)
Progress Note  VASCULAR SURGERY ASSESSMENT & PLAN:   OPEN GROIN WOUNDS: Patient is undergone bilateral iliofemoral embolectomies and lower extremity embolectomies with bilateral common iliac artery stents.  She also has a stent in the right external iliac artery and left external iliac artery.  She has open wounds in both groins with a VAC on each groin.  The bovine pericardial patch is exposed on the right and were using white foam on top of this.  Continue meticulous wound care.  Her next VAC change is tomorrow.    POD 2 LEFT AKA: Her amputation site is healing nicely.  Continue daily dressing changes.  PULMONARY: She was complaining of some shortness of breath earlier.  She has decreased breath sounds at the bases.  Her last chest x-ray was on the 23rd which showed some pulmonary edema.  I will obtain a chest x-ray today to rule out pneumonia.  Her white blood cell count is 9.1.  She is afebrile.   NUTRITION: She is getting tube feeds.   ID: She is on IV Zosyn.  Her groin wound grew moderate E. coli and rare Proteus mirabilis.   DVT PROPHYLAXIS: She is getting subcu heparin.    Gae Gallop, MD 1:15 PM    08/19/2022 7:25 AM 5 Days Post-Op  Subjective:  feeling okay, having some generalized pain    Vitals:   08/18/22 2331 08/19/22 0337  BP: (!) 109/41 (!) 110/44  Pulse: 74 77  Resp: 15 17  Temp: 98.4 F (36.9 C) 98.3 F (36.8 C)  SpO2: 96% 96%    Physical Exam: Lungs:  nonlabored Incisions:  bilateral groin incisions with wound vac with good seal. L AKA intact and healing well, with dry dressing Extremities:  L AKA. RLE warm and well perfused with DP/PT doppler signals  CBC    Component Value Date/Time   WBC 9.1 08/19/2022 0534   RBC 2.71 (L) 08/19/2022 0534   HGB 8.0 (L) 08/19/2022 0534   HGB 14.3 06/20/2012 2127   HCT 26.1 (L) 08/19/2022 0534   HCT 42.9 06/20/2012 2127   PLT 574 (H) 08/19/2022 0534   PLT 357 06/20/2012 2127   MCV 96.3 08/19/2022 0534    MCV 91 06/20/2012 2127   MCH 29.5 08/19/2022 0534   MCHC 30.7 08/19/2022 0534   RDW 15.1 08/19/2022 0534   RDW 13.6 06/20/2012 2127   LYMPHSABS 1.1 08/19/2022 0534   MONOABS 1.0 08/19/2022 0534   EOSABS 0.2 08/19/2022 0534   BASOSABS 0.1 08/19/2022 0534    BMET    Component Value Date/Time   NA 139 08/19/2022 0534   NA 135 (L) 06/20/2012 2127   K 4.6 08/19/2022 0534   K 4.0 06/20/2012 2127   CL 104 08/19/2022 0534   CL 100 06/20/2012 2127   CO2 29 08/19/2022 0534   CO2 27 06/20/2012 2127   GLUCOSE 168 (H) 08/19/2022 0534   GLUCOSE 250 (H) 06/20/2012 2127   BUN 29 (H) 08/19/2022 0534   BUN 20 (H) 06/20/2012 2127   CREATININE 0.73 08/19/2022 0534   CREATININE 0.60 06/20/2012 2127   CALCIUM 8.4 (L) 08/19/2022 0534   CALCIUM 8.8 06/20/2012 2127   GFRNONAA >60 08/19/2022 0534   GFRNONAA >60 06/20/2012 2127   GFRAA >60 06/20/2012 2127    INR    Component Value Date/Time   INR 1.0 08/03/2022 2156     Intake/Output Summary (Last 24 hours) at 08/19/2022 0725 Last data filed at 08/19/2022 0614 Gross per 24 hour  Intake  939.83 ml  Output 1250 ml  Net -310.17 ml      Assessment/Plan:  71 y.o. female is s/p: bilateral iliofemoral endarterectomies, lower extremity embolectomies with fasciotomies, L AKA and bilateral groin washout with wound vac placement   -Bilateral groin incisions with wound vac with good seal, next change with WOC is scheduled for 12/27 -RLE warm and well perfused with PT/DP doppler signals -Cont IV Zosyn -L AKA healing well, continue dry dressings   Vicente Serene, PA-C [facility] [phone] 08/19/2022 7:25 AM

## 2022-08-19 NOTE — Progress Notes (Signed)
Heather [name] SVX:[id number] DOB: 03/02/1951 DOA: 08/04/2022 PCP: [name], FNP   Subj: 71 year old female with PMHx of DM with neuropathy, HTN, HLD, COPD, GERD, PAD, s/p TAVR, chronic diastolic CHF, anxiety/depression/schizophrenia, tobacco abuse,   Presented to ER with sudden onset of dizziness, blurred vision, headache, nausea and vomiting. BP in EMS 209/76.  She was admitted to ICU for hemorrhagic stroke and bilateral lower extremity acute on chronic ischemia.  Vascular surgery consulted and s/p bilateral lower extremity femoral endarterectomy and patch angioplasty with iliac stenting, 4 compartment fasciotomies for acute on chronic lower extremity ischemia.  Postop remained on vent and Cleviprex.  Transferred to floor and TRH on 12/17.  Neurology and vascular surgery following.  Vascular surgery plans left lower extremity amputation on 12/21.    Obj:  12/26 afebrile overnight, A/O x 4.  Wound VAC leak again.  Will need to be changed today.      Objective: VITAL SIGNS: Temp: 98.2 F (36.8 C) (12/26 1202) Temp Source: Oral (12/26 1202) BP: 134/53 (12/26 1202) Pulse Rate: 88 (12/26 1202) SPO2; 97% 12/25 FIO2: 2 L/min O2   Intake/Output Summary (Last 24 hours) at 08/19/2022 1233 Last data filed at 08/19/2022 2330 Gross per 24 hour  Intake 699.83 ml  Output 1250 ml  Net -550.17 ml     Physical Exam:  General: A/O x 4, No acute respiratory distress Eyes: negative scleral hemorrhage, negative anisocoria, negative icterus ENT: Negative Runny nose, negative gingival bleeding, Neck:  Negative scars, masses, torticollis, lymphadenopathy, JVD Lungs: Clear to auscultation bilaterally without wheezes or crackles Cardiovascular: Regular rate and rhythm without murmur gallop or rub normal S1 and S2 Abdomen: OBESE abdominal pain, nondistended, positive soft, bowel sounds, no rebound, no ascites, no appreciable mass Extremities: bilateral groin wound vacs in place: Air leak, left  groin.  LEFT AKA clean and wraped,  Skin: Negative rashes, lesions, ulcers Psychiatric:  Negative depression, negative anxiety, negative fatigue, negative mania  Central nervous system:  Cranial nerves II through XII intact, tongue/uvula midline, all extremities muscle strength 5/5, sensation intact throughout, negative dysarthria, negative expressive aphasia, negative receptive aphasia.    Mobility Assessment (last 72 hours)     Mobility Assessment     Row Name 08/18/22 1935 08/18/22 1832 08/18/22 1125 08/17/22 2000 08/17/22 1000   Does patient have an order for bedrest or is patient medically unstable Yes- Bedfast (Level 1) - Complete No - Continue assessment No - Continue assessment No - Continue assessment No - Continue assessment   What is the highest level of mobility based on the progressive mobility assessment? Level 1 (Bedfast) - Unable to balance while sitting on edge of bed Level 1 (Bedfast) - Unable to balance while sitting on edge of bed Level 1 (Bedfast) - Unable to balance while sitting on edge of bed Level 1 (Bedfast) - Unable to balance while sitting on edge of bed Level 1 (Bedfast) - Unable to balance while sitting on edge of bed   Is the above level different from baseline mobility prior to current illness? Yes - Recommend PT order -- Yes - Recommend PT order -- Yes - Recommend PT order    [place] Name 08/16/22 2200           Does patient have an order for bedrest or is patient medically unstable No - Continue assessment       What is the highest level of mobility based on the progressive mobility assessment? Level 2 (Chairfast) - Balance while sitting on edge of  bed and cannot stand       Is the above level different from baseline mobility prior to current illness? Yes - Recommend PT order                  DVT prophylaxis: Subcu heparin Code Status: Full Family Communication:  Status is: Inpatient    Dispo: The patient is from: Home              Anticipated d/c  is to: Home              Anticipated d/c date is: > 3 days              Patient currently is not medically stable to d/c.    Procedures/Significant Events: CTA head and neck: Negative for large vessel occlusion.  70% stenosis of innominate artery origin. Follow-up CT head 12/12: Stable intraparenchymal hematoma within the cerebellar vermis.  Stable moderate mass effect upon the fourth ventricle. MRI brain stable left cerebellar ICH with extension to fourth ventricle.  No hydrocephalus. Repeat CT head 12/14: Stable hematoma and no hydrocephalus. TTE: LVEF 60-65%, LDL 34, A1c 12.9.  12/11 Ehocardiogram  Left Ventricle: Left ventricular ejection fraction, by estimation, is 60  to 65%. The left ventricle has normal function. The left ventricle has no  regional wall motion abnormalities. The left ventricular internal cavity  size was normal in size.  Suboptimal image quality limits for assessment of left ventricular  hypertrophy. Left ventricular diastolic parameters are indeterminate.   Aortic Valve: The aortic valve has been repaired/replaced. Aortic valve  regurgitation is trivial. Aortic valve mean gradient measures 9.2 mmHg.  Aortic valve peak gradient measures 17.8 mmHg. Aortic valve area, by VTI  measures 2.35 cm. There is a 23 mm  Sapien prosthetic, stented (TAVR) valve present in the aortic position.  Procedure Date: 09/2016. Echo findings are consistent with normal structure  and function of the aortic valve prosthesis.  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  -Mild regional mass effect without upstream hydrocephalus. 2.  No new acute intracranial pathology 12/23 PCXR  Mild interstitial pulmonary edema. 2. Stable support lines and tubes. The right internal jugular central venous catheter appears kinked in its extravascular component.    Consultants:  Vascular surgery Dr. Servando Snare   Cultures 12/21 Groin positive moderate GNR/rare Proteus Mirabilis   Antimicrobials: Anti-infectives (From admission, onward)    Start     Ordered Stop   08/18/22 2300  ceFAZolin (ANCEF) IVPB 2g/100 mL premix        08/18/22 1455     08/18/22 2300  metroNIDAZOLE (FLAGYL) IVPB 500 mg        08/18/22 1455     08/15/22 1100  vancomycin (VANCOCIN) IVPB 1000 mg/200 mL premix  Status:  Discontinued        08/14/22 1006 08/15/22 1307   08/14/22 1400  piperacillin-tazobactam (ZOSYN) IVPB 3.375 g  Status:  Discontinued        08/14/22 1006 08/18/22 1455   08/14/22 1100  vancomycin (VANCOREADY) IVPB 1500 mg/300 mL        08/14/22 1006 08/14/22  1502   08/04/22 1045  ceFAZolin (ANCEF) IVPB 2g/100 mL premix        08/04/22 0958 08/04/22 1100           Assessment & Plan: Covid vaccination;   Principal Problem:   ICH (intracerebral hemorrhage) ([place]) Active Problems:   Abscess of anal and rectal regions   Decubitus ulcer of sacral region, stage 2 ([place])   Pulmonary edema  Bilateral lower extremity acute on chronic ischemia: - s/p bilateral lower extremity femoral endarterectomy and patch angioplasty with iliac stenting, 4 compartment fasciotomiesPost op care per vasc surgery  - ck decreasing, 6462 on 12/16, down to 3379 on 12/19. -Vascular surgery follow-up appreciated.  LLE with no palpable or dopplerable pulses, cool foot to touch with mottling of the toes, reportedly worse than before.   - Anticipating she will get an AKA on 12/21 as discussed today with Dr. Donzetta Matters, [facility]. -IV heparin discontinued 12/16 and now on aspirin 81 Mg daily and Plavix 75 Mg daily.  See discussion below regarding antiplatelets. -Discussed  with Dr. Leonie Man, neurology on 12/19.  From a stroke perspective, patient only needs a single antiplatelet agent.  If vascular surgery wishes to continue Plavix then he recommends discontinuing aspirin.  Communicated with Dr. Donzetta Matters, [place] to continue Plavix. ASA DC'ed. -12/20 vascular surgery plans LEFT AKA on 12/21 -11/22 groin abscess positive moderate GNR/rare Proteus Mirabilis - 12/22 DC vancomycin, continue Zosyn x 7 days -12/25 narrowed antibiotic coverage see above -12/26 wound VAC replaced again secondary to continued leaking   Acute cerebellar vermis intraparenchymal hemorrhage  -Code stroke CT: Acute intraparenchymal hematoma within the cerebellar vermis measuring 11 cc in volume and mild mass effect upon the fourth ventricle. -Negative stroke per CT/MRI scan.  Small ICH see below  was on aspirin 81 Mg daily + Plavix 75 Mg daily PTA, then here on IV heparin per stroke protocol which was switched to aspirin 81 Mg daily on 12/17.  Remains on Plavix 75 Mg daily as well. As per therapy recommendations, SNF when medically optimized. -Neurology signed off 12/18.   -Please see above regarding discussion with Dr. Leonie Man on 12/19 regarding antiplatelets. -12/20 negative neurological deficit -12/25 stable  AMS - 12/23 PAGED to patient's bedside secondary to increased WBC, AMS.  Patient was diaphoretic inappropriately responsive, tachycardic.  After~5 minutes post nebulizer treatment began to answer some questions appropriately but definite change from this morning. - 12/23 obtain repeat CT head: Extension of ICH? - 12/23 obtain EEG, dependent upon findings will consult neurology. -12/23 seizure precautions -12/24 discussed case at length with Dr. Alferd Patee neurology and he is familiar with patient, states low suspicion for seizure activity last night. -12/25 resolved   Acute respiratory failure with hypoxia/Pulmonary edema/Pneumonia Secondary to acute cerebellar hemorrhagic stroke and  postop Extubated 12/14. -12/20 on room air -12/23 acute pulmonary edema: Titrate O2 to maintain SpO2> 93% -12/23 continue Zosyn minimal 7 days.  See bilateral lower extremity ischemia -12/23 flutter valve - 12/23 incentive spirometry - 12/23 DuoNeb QID -12/23 Revefenacin [place] Bees) -12/23 DuoNeb QID -12/23 Albumin  50 gx 1 -12/25 stable  Hx of COPD with tobacco abuse. -triple therapy nebs  -No clinical exacerbation at this time.   Hx of depression, bipolar, schizophrenia, insomnia. - trazodone at night    HTN emergency. -Coreg 12.5 mg BID -Hydralazine PRN -Labetalol PRN -12/20 BP currently controlled  Hx of CAD, chronic HFpEF, HLD, HTN, s/p TAVR. - goal SBP <160 -Antiplatelet management as above.  Cozaar being held due to  AKI. - holding lipitor in setting of elevated CK  - Got a couple doses of IV Lasix in ICU. -12/20 echocardiogram does not support diagnosis of HFpEF -Troponins never trended.  Patient currently without CP -Strict in and out +11.7 L - Daily weight     AKI from rhabdomyolysis. -Trend CK  Latest Reference Range & Units 08/12/22 04:20 08/13/22 02:18 08/16/22 04:35 08/17/22 00:42 08/18/22 05:13  CK Total 38 - 234 U/L 3,379 (H) 2,499 (H) 719 (H) 442 (H) 179  (H): Data is abnormally high -12/22 increase LR 126m/hr -12/23 LR stopped this a.m. secondary to increased WOB/PCXR showing pulmonary edema and small effusion see PCXR -12/23 will need to restart fluids later this afternoon at slower rate. -12/25 resolved  Non-gap metabolic acidosis. -AKI and metabolic acidosis resolved -Resolved.   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 -12/12 hemoglobin A1c= 12.9 - 12/23 increase Semglee 22 units daily - 12/26 decrease NovoLog 5 units q 4 hrs - 12/26 decrease moderate SSI CBG (last 3)  Recent Labs    08/19/22 0344 08/19/22 0904 08/19/22 1204  GLUCAP 146* 177* 133*      DM neuropathy. -See DM type II   Anemia of critical illness. - f/u  CBC - transfuse for hgb <7 -Hemoglobin has dropped from 8.5-7.9 >7.6>7.7 in the absence of overt bleeding.  Follow CBC in a.m. and transfuse if hemoglobin 7 g or less. -12/20 transfuse 2 unit PRBC Lab Results  Component Value Date   HGB 8.0 (L) 08/19/2022   HGB 8.3 (L) 08/18/2022   HGB 8.7 (L) 08/17/2022   HGB 9.2 (L) 08/16/2022   HGB 10.6 (L) 08/14/2022  -Stable   Dysphagia: Likely related to hemorrhagic stroke Has left nasal core track with ongoing tube feeds. Failed swallow evaluation on 12/16. As per SLP follow-up, completed FEES and recommended dysphagia to 2 diet with thin liquids on 12/18. -12/20 positive anorexia -12/26 resolved patient now eating    Nutritional Status Nutrition Problem: Increased nutrient needs Etiology: wound healing Signs/Symptoms: estimated needs Interventions: Tube feeding, MVI  Stage II sacral decubitus ulcer Pressure Injury 08/04/22 Sacrum Stage 2 -  Partial thickness loss of dermis presenting as a shallow open injury with a red, pink wound bed without slough. (Active)  08/04/22 0100  Location: Sacrum  Location Orientation:   Staging: Stage 2 -  Partial thickness loss of dermis presenting as a shallow open injury with a red, pink wound bed without slough.  Wound Description (Comments):   Present on Admission: Yes  Dressing Type Foam - Lift dressing to assess site every shift 08/18/22 1935        Mobility Assessment (last 72 hours)     Mobility Assessment     Row Name 08/18/22 1935 08/18/22 1832 08/18/22 1125 08/17/22 2000 08/17/22 1000   Does patient have an order for bedrest or is patient medically unstable Yes- Bedfast (Level 1) - Complete No - Continue assessment No - Continue assessment No - Continue assessment No - Continue assessment   What is the highest level of mobility based on the progressive mobility assessment? Level 1 (Bedfast) - Unable to balance while sitting on edge of bed Level 1 (Bedfast) - Unable to balance while  sitting on edge of bed Level 1 (Bedfast) - Unable to balance while sitting on edge of bed Level 1 (Bedfast) - Unable to balance while sitting on edge of bed Level 1 (Bedfast) - Unable to balance while sitting on edge of bed   Is the  above level different from baseline mobility prior to current illness? Yes - Recommend PT order -- Yes - Recommend PT order -- Yes - Recommend PT order    [place] Name 08/16/22 2200           Does patient have an order for bedrest or is patient medically unstable No - Continue assessment       What is the highest level of mobility based on the progressive mobility assessment? Level 2 (Chairfast) - Balance while sitting on edge of bed and cannot stand       Is the above level different from baseline mobility prior to current illness? Yes - Recommend PT order                  Time: 50 minutes       Care during the described time interval was provided by me .  I have reviewed this patient's available data, including medical history, events of note, physical examination, and all test results as part of my evaluation.

## 2022-08-19 NOTE — Progress Notes (Signed)
Hypoglycemic Event  CBG: 30  Treatment: D50, full amp  Symptoms: Sweaty  Follow-up CBG: Time: 1726 CBG Result: 139  Possible Reasons for Event: Inadequate meal intake   Comments/MD notified: Paged and Messaged Dr. Philis Pique; Tube feed restarted early and this nurse assisted patient with meal.    Natale Milch

## 2022-08-19 NOTE — Evaluation (Signed)
Physical Therapy Re-Evaluation Patient Details Name: Heather [name] MRN: [id number] DOB: May 12, 1951 Today's Date: 08/19/2022  History of Present Illness  Heather [name] is a 71 y.o. 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  Found to have mottled LE's after given Narcdipine due to hypertention and went for emergent common femoral and iliofemoral endarterectomy and bilateral 4 compartment fasciotomies on 08/04/22. 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 History of smoker(2ppd), severe AI/AS s/p TAVR, CAD s/p PCI and on DAPT, COPD, PAD, HTN  Clinical Impression  Pt re-evaluated s/p L AKA. Pt presents with decreased functional mobility secondary to poor sitting balance, generalized weakness, impaired cognition, and decreased activity tolerance. Pt requiring two person maximal assist for bed mobility. After sitting EOB ~5 minutes, pt requesting to lie back down due to dizziness and diaphoresis. BP 104/77, HR 73. Continue to recommend SNF for ongoing Physical Therapy.        Recommendations for follow up therapy are one component of a multi-disciplinary discharge planning process, led by the attending physician.  Recommendations may be updated based on patient status, additional functional criteria and insurance authorization.  Follow Up Recommendations Skilled nursing-short term rehab (<3 hours/day) Can patient physically be transported by private vehicle: No    Assistance Recommended at Discharge Frequent or constant Supervision/Assistance  Patient can return home with the following  Two people to help with walking and/or transfers;Two people to help with bathing/dressing/bathroom    Equipment Recommendations Other (comment) (defer)  Recommendations for Other Services       Functional Status Assessment       Precautions / Restrictions Precautions Precautions: Fall;Other  (comment) Precaution Comments: bilateral groin wound vac Restrictions Weight Bearing Restrictions: Yes RLE Weight Bearing: Weight bearing as tolerated LLE Weight Bearing: Non weight bearing      Mobility  Bed Mobility Overal bed mobility: Needs Assistance Bed Mobility: Rolling, Sit to Supine, Supine to Sit Rolling: Max assist   Supine to sit: Max assist, +2 for physical assistance Sit to supine: Max assist, +2 for physical assistance   General bed mobility comments: poor initiation, max multimodal cueing    Transfers                   General transfer comment: deferred due to pt report of dizziness    Ambulation/Gait                  Stairs            Wheelchair Mobility    Modified Rankin (Stroke Patients Only) Modified Rankin (Stroke Patients Only) Pre-Morbid Rankin Score: No significant disability Modified Rankin: Severe disability     Balance Overall balance assessment: Needs assistance Sitting-balance support: Feet unsupported, Bilateral upper extremity supported Sitting balance-Leahy Scale: Poor Sitting balance - Comments: mod to min assist for sitting balance due to difficulty with trunk control                                     Pertinent Vitals/Pain Pain Assessment Pain Assessment: Faces Faces Pain Scale: Hurts even more Pain Location: generalized with movement; bilateral groin Pain Descriptors / Indicators: Aching, Discomfort, Grimacing, Guarding Pain Intervention(s): Limited activity within patient's tolerance, Monitored during session, Patient requesting pain meds-RN notified    Home Living Family/patient expects to be  discharged to:: Skilled nursing facility Living Arrangements: Spouse/significant other Available Help at Discharge: Family Type of Home: Mobile home Home Access: Stairs to enter Entrance Stairs-Rails: Right;Left Entrance Stairs-Number of Steps: 6   Home Layout: One level Home Equipment:  Lorenzo - single point;Wheelchair - Engineer, manufacturing systems (2 wheels)      Prior Function Prior Level of Function : Independent/Modified Independent             Mobility Comments: using cane for mobility; helping her spouse who has dementia ADLs Comments: independent and driving     Hand Dominance   Dominant Hand: Right    Extremity/Trunk Assessment   Upper Extremity Assessment Upper Extremity Assessment: Defer to OT evaluation    Lower Extremity Assessment Lower Extremity Assessment: RLE deficits/detail;LLE deficits/detail RLE Deficits / Details: increased edema, ankle ROM limited, grossly 2-/5 LLE Deficits / Details: s/p AKA, grossly weak    Cervical / Trunk Assessment Cervical / Trunk Assessment: Kyphotic  Communication   Communication: No difficulties  Cognition Arousal/Alertness: Awake/alert Behavior During Therapy: Flat affect Overall Cognitive Status: Impaired/Different from baseline Area of Impairment: Attention, Memory, Following commands, Safety/judgement, Awareness, Problem solving                   Current Attention Level: Sustained Memory: Decreased short-term memory Following Commands: Follows one step commands with increased time, Follows one step commands inconsistently Safety/Judgement: Decreased awareness of safety, Decreased awareness of deficits Awareness: Emergent Problem Solving: Requires verbal cues, Requires tactile cues, Slow processing, Decreased initiation, Difficulty sequencing General Comments: significantly delayed processing        General Comments      Exercises General Exercises - Lower Extremity Ankle Circles/Pumps: Right, 10 reps, Supine Quad Sets: Right, 5 reps, Supine Gluteal Sets: Both, 5 reps, Supine Long Arc Quad: AAROM, Right, 10 reps, Seated   Assessment/Plan    PT Assessment    PT Problem List         PT Treatment Interventions      PT Goals (Current goals can be found in the Care Plan section)  Acute  Rehab PT Goals Potential to Achieve Goals: Fair    Frequency Min 2X/week     Co-evaluation               AM-PAC PT "6 Clicks" Mobility  Outcome Measure Help needed turning from your back to your side while in a flat bed without using bedrails?: A Lot Help needed moving from lying on your back to sitting on the side of a flat bed without using bedrails?: Total Help needed moving to and from a bed to a chair (including a wheelchair)?: Total Help needed standing up from a chair using your arms (e.g., wheelchair or bedside chair)?: Total Help needed to walk in hospital room?: Total Help needed climbing 3-5 steps with a railing? : Total 6 Click Score: 7    End of Session   Activity Tolerance: Patient limited by fatigue;Other (comment);Patient limited by pain (limited by dizziness) Patient left: in bed;with call bell/phone within reach;with bed alarm set Nurse Communication: Mobility status;Patient requests pain meds PT Visit Diagnosis: Muscle weakness (generalized) (M62.81);Pain;Other abnormalities of gait and mobility (R26.89) Pain - Right/Left: Left Pain - part of body: Leg    Time: 1610-9604 PT Time Calculation (min) (ACUTE ONLY): 23 min   Charges:     PT Treatments $Therapeutic Activity: 23-37 mins        Wyona Almas, PT, DPT Acute Rehabilitation Services Office [phone]   Carloine T  Brown 08/19/2022, 4:10 PM

## 2022-08-19 NOTE — Progress Notes (Signed)
Wound vac has been continuing to have alarm leaking. Wound vac dressing was changed on the right groin,but we did not have enough supply to change the dressing on the left groin. We ordered a new wound vac set. We will hand off to the day shift RN to change later once we get the new supply. Serosanguinous drainage in canister total 350 ml in 24 hours.   Pt is alert and fully oriented x 4. She is hemodynamically stable, afebrile, on 2 LPM of O2 NCL. No respiratory distress noted.  Pain is well controlled. We will monitor.   Kennyth Lose, RN

## 2022-08-20 DIAGNOSIS — I614 Nontraumatic intracerebral hemorrhage in cerebellum: Secondary | ICD-10-CM | POA: Diagnosis not present

## 2022-08-20 LAB — CK TOTAL AND CKMB (NOT AT ARMC)
CK, MB: 1.7 ng/mL (ref 0.5–5.0)
Relative Index: INVALID (ref 0.0–2.5)
Total CK: 97 U/L (ref 38–234)

## 2022-08-20 LAB — COMPREHENSIVE METABOLIC PANEL
ALT: 12 U/L (ref 0–44)
AST: 16 U/L (ref 15–41)
Albumin: 1.8 g/dL — ABNORMAL LOW (ref 3.5–5.0)
Alkaline Phosphatase: 118 U/L (ref 38–126)
Anion gap: 6 (ref 5–15)
BUN: 31 mg/dL — ABNORMAL HIGH (ref 8–23)
CO2: 28 mmol/L (ref 22–32)
Calcium: 8.5 mg/dL — ABNORMAL LOW (ref 8.9–10.3)
Chloride: 102 mmol/L (ref 98–111)
Creatinine, Ser: 0.77 mg/dL (ref 0.44–1.00)
GFR, Estimated: >60 mL/min (ref >60)
Glucose, Bld: 253 mg/dL — ABNORMAL HIGH (ref 70–99)
Potassium: 4.2 mmol/L (ref 3.5–5.1)
Sodium: 136 mmol/L (ref 135–145)
Total Bilirubin: 0.3 mg/dL (ref 0.3–1.2)
Total Protein: 5.4 g/dL — ABNORMAL LOW (ref 6.5–8.1)

## 2022-08-20 LAB — PHOSPHORUS: Phosphorus: 3 mg/dL (ref 2.5–4.6)

## 2022-08-20 LAB — GLUCOSE, CAPILLARY
Glucose-Capillary: 276 mg/dL — ABNORMAL HIGH (ref 70–99)
Glucose-Capillary: 237 mg/dL — ABNORMAL HIGH (ref 70–99)
Glucose-Capillary: 193 mg/dL — ABNORMAL HIGH (ref 70–99)
Glucose-Capillary: 103 mg/dL — ABNORMAL HIGH (ref 70–99)
Glucose-Capillary: 163 mg/dL — ABNORMAL HIGH (ref 70–99)
Glucose-Capillary: 216 mg/dL — ABNORMAL HIGH (ref 70–99)

## 2022-08-20 LAB — CBC WITH DIFFERENTIAL/PLATELET
Abs Immature Granulocytes: 0.12 10*3/uL — ABNORMAL HIGH (ref 0.00–0.07)
Basophils Absolute: 0.1 10*3/uL (ref 0.0–0.1)
Basophils Relative: 1 %
Eosinophils Absolute: 0.2 10*3/uL (ref 0.0–0.5)
Eosinophils Relative: 2 %
HCT: 26.6 % — ABNORMAL LOW (ref 36.0–46.0)
Hemoglobin: 8.1 g/dL — ABNORMAL LOW (ref 12.0–15.0)
Immature Granulocytes: 1 %
Lymphocytes Relative: 11 %
Lymphs Abs: 1 10*3/uL (ref 0.7–4.0)
MCH: 29.5 pg (ref 26.0–34.0)
MCHC: 30.5 g/dL (ref 30.0–36.0)
MCV: 96.7 fL (ref 80.0–100.0)
Monocytes Absolute: 0.8 10*3/uL (ref 0.1–1.0)
Monocytes Relative: 9 %
Neutro Abs: 7 10*3/uL (ref 1.7–7.7)
Neutrophils Relative %: 76 %
Platelets: 598 10*3/uL — ABNORMAL HIGH (ref 150–400)
RBC: 2.75 MIL/uL — ABNORMAL LOW (ref 3.87–5.11)
RDW: 14.8 % (ref 11.5–15.5)
WBC: 9.1 10*3/uL (ref 4.0–10.5)
nRBC: 0 % (ref 0.0–0.2)

## 2022-08-20 LAB — MAGNESIUM: Magnesium: 2.1 mg/dL (ref 1.7–2.4)

## 2022-08-20 MED ORDER — SENNOSIDES-DOCUSATE SODIUM 8.6-50 MG PO TABS: 1.0000 | ORAL_TABLET | Freq: Two times a day (BID) | ORAL | Status: DC

## 2022-08-20 MED ORDER — PROSOURCE PLUS PO LIQD
30.0000 mL | Freq: Two times a day (BID) | ORAL | Status: DC
  Administered 2022-08-20: 30 mL via ORAL
  Filled 2022-08-20 (×4): qty 30

## 2022-08-20 MED ORDER — PANTOPRAZOLE SODIUM 40 MG PO TBEC
40.0000 mg | DELAYED_RELEASE_TABLET | Freq: Every day | ORAL | Status: DC
  Administered 2022-08-20 – 2022-09-22 (×34): 40 mg via ORAL
  Filled 2022-08-20 (×35): qty 1

## 2022-08-20 MED ORDER — METRONIDAZOLE 500 MG PO TABS
500.0000 mg | ORAL_TABLET | Freq: Two times a day (BID) | ORAL | Status: DC
  Administered 2022-08-20 – 2022-09-08 (×38): 500 mg
  Filled 2022-08-20 (×38): qty 1

## 2022-08-20 MED ORDER — ATORVASTATIN CALCIUM 80 MG PO TABS
80.0000 mg | ORAL_TABLET | Freq: Every day | ORAL | Status: DC
  Administered 2022-08-20 – 2022-09-08 (×19): 80 mg
  Filled 2022-08-20 (×18): qty 1

## 2022-08-20 MED ORDER — PIVOT 1.5 CAL PO LIQD
1000.0000 mL | ORAL | Status: DC
  Administered 2022-08-20 – 2022-08-28 (×9): 1000 mL
  Filled 2022-08-20 (×10): qty 1000

## 2022-08-20 MED ORDER — MIRTAZAPINE 15 MG PO TABS
7.5000 mg | ORAL_TABLET | Freq: Every day | ORAL | Status: DC
  Administered 2022-08-20 – 2022-09-08 (×20): 7.5 mg
  Filled 2022-08-20 (×20): qty 1

## 2022-08-20 MED ORDER — METRONIDAZOLE 500 MG/100ML IV SOLN: 500.0000 mg | Freq: Two times a day (BID) | INTRAVENOUS | Status: DC

## 2022-08-20 NOTE — Progress Notes (Signed)
Nutrition Follow-up  DOCUMENTATION CODES:   Not applicable  INTERVENTION:  Initiated 48 hour calorie count per order to better assess oral intake.  Encouraged intake at meals. Discussed importance of adequate calorie and protein intake to meet estimated needs.  Will increase caloric provision from nocturnal tube feeds via Cortrak to better meet estimated needs in setting of poor PO intake: -Provide Pivot 1.5 at 60 mL/hour x 16 hours from 1600-0800 (960 mL) -Provides: 1440 kcal, 90 grams of protein, 720 mL H2O daily -Meets 85% minimum estimated kcal needs and 90% minimum estimated protein needs  Continue providing minimum free water flush of 30 mL every 4 hours while tube feeds infusing to maintain patency.  Continue multivitamin with minerals daily per tube. This can be changed to oral once pt has improved oral intake.  After discussing with SLP via secure chat, will initiate the following oral nutrition supplements per current recommendations: -Continue Magic cup TID with meals, each supplement provides 290 kcal and 9 grams of protein -Provide Necar Mighty Shake TID with meals, each supplement provides 200 kcal and 7 grams of protein. -Provide PROSource Plus 30 mL po BID, each supplement provides 100 kcal and 15 grams of protein.  NUTRITION DIAGNOSIS:   Increased nutrient needs related to wound healing as evidenced by estimated needs.  Ongoing.  GOAL:   Patient will meet greater than or equal to 90% of their needs  Progressing with interventions.  MONITOR:   PO intake, Supplement acceptance, Diet advancement, Labs, Weight trends, TF tolerance, Skin, I & O's  REASON FOR ASSESSMENT:   Consult Enteral/tube feeding initiation and management  ASSESSMENT:   Pt with PMH of CHF, anxiety, PAD, bipolar, COPD, CAD, depression, DM with neuropathy, GERD, HTN, HLD, schizophrenia admitted with acute ICH and critical limb ischemia.  12/11: s/p b/l common femoral endarterectomy,  iliofemoral endarterectomy, lower extremity embolectomy and stent of b/l common iliac arteries and Rt external iliac artery, and b/l 4 compartment fasciotomies  S/p cortrak placement; per xray tip in distal stomach or proximal duodenum  12/14: extubated 12/16: transferred from 4N to 4E 12/18: underwent FEES; advanced to dysphagia 2 diet with thin liquids per SLP, however there is a comment on the diet order for milk precautions: no milk to drink, no ice cream, milkshakes, milk-like supplements as pt had penetration with thin milk on study completed 12/19: plan per team was to discontinue tube feeds, but after discussing with MD regarding poor PO intake, changed to nocturnal tube feeds instead 12/21: s/p left AKA and shart excisional debridement of bilateral surgical groin wounds and wound VAC placement 12/22: seen by SLP and diet advanced to dysphagia 3 with thin liquids; per comment section of this diet order no milk on tray but other dairy okay  RD received consult for initiation of calorie count and also diet education in setting of patient's poor PO intake. Per review of chart nocturnal tube feeds were initiated early yesterday in setting of hypoglycemic even from poor PO intake.  Met with patient at bedside. She reports her appetite remains poor. She is unable to tell RD how much she is eating at meals. Breakfast tray was untouched at time of RD assessment. Per documentation available in chart, pt eating 0-25% of meals. Pt reports she has not been eating Magic Cup supplements regularly, but is willing to try them.  RD still confused by diet order for thin liquids except for milk products. Reached out to [facility] SLP group chart. Per answer from secure chat,  pt cannot have milk or milk products that are a thin liquids or would melt to a thin liquid. Also per SLP recommendation in secure chat, no Ensure supplements. She is okay to have other thin ONS that are not milk-like and can also have milk-like  supplements that don't melt to a thin liquid.  Pt was documented to be 63.5 kg on 08/08/22. Current wt is 69.3 kg. Suspect may be falsely elevated from fluid status.  Enteral Access: 10 Fr. Cortrak tube placed 12/11; 58 cm in left nare secured with bridle; tip terminates in distal stomach or proximal duodenum per abdominal x-ray 12/11  Tube Feed Regimen: Pivot 1.5 at 60 mL/hour x 12 hours overnight from 1800-0600 (720 mL) Provides: 1080 kcal, 68 grams of protein, 540 mL H2O daily Meets 64% minimum estimated kcal needs and 68% minimum estimated protein needs  UOP: 1500 mL or 0.9 mL/kg/hr  I/O: +6518.1 mL since 08/06/22  Medications reviewed and include: carvedilol, Colace, Novolog 0-15 units Q4hrs, Novolog 5 units every 4 hours while tube feeds are infusing, Semglee 22 units daily, Flagyl, multivitamin, pantoprazole, Miralax, senna-docusate, cefazolin  Labs reviewed: CBG 30-276, BUN 31  Diet Order:   Diet Order             DIET DYS 3 Room service appropriate? No; Fluid consistency: Thin  Diet effective now                  EDUCATION NEEDS:   Education needs have been addressed (Encouraged adequate intake of calories and protein and discussed importance of adequate intake.)  Skin:  Skin Assessment: Skin Integrity Issues: Skin Integrity Issues:: Stage II, Incisions, Wound VAC, Other (Comment) Stage II: sacrum Wound Vac: bilateral groin surgical wounds Incisions: closed incision to leg Other: surgical wounds bilateral groin with wound VAC  Last BM:  08/20/22 - large type 7  Height:   Ht Readings from Last 1 Encounters:  08/04/22 5' (1.524 m)   Weight:   Wt Readings from Last 1 Encounters:  08/20/22 69.3 kg   BMI:  Body mass index is 29.84 kg/m.  Estimated Nutritional Needs:   Kcal:  1700-1900  Protein:  100-125 grams  Fluid:  >1.7 L/day  [name], MS, RD, LDN, CNSC Pager number available on Amion

## 2022-08-20 NOTE — Progress Notes (Addendum)
Progress Note  VASCULAR SURGERY ASSESSMENT & PLAN:   OPEN GROIN WOUNDS: Continue VAC changes 3 times a week.  Appreciate wound care team's help with VAC changes.  POD 3 LEFT AKA: Her amputation site is healing nicely.  Continue daily dressing changes.   PULMONARY: She had been having some shortness of breath.  Her chest x-ray shows stable mild pulmonary edema.  Her white blood cell count is not elevated and she does not have a fever.  Thus I do not suspect pneumonia.  I have encouraged her to sit up in the chair more to help with her lungs.  NUTRITION: She is getting tube feeds.   ID: She is on IV Zosyn.  Her groin wound grew moderate E. coli and rare Proteus mirabilis.   DVT PROPHYLAXIS: She is getting subcu heparin.   Gae Gallop, MD 2:21 PM   08/20/2022 7:42 AM 6 Days Post-Op  Subjective:  feeling pain in both of her groins    Vitals:   08/19/22 2355 08/20/22 0304  BP: (!) 117/48 (!) 129/49  Pulse: 72 88  Resp:  16  Temp: 97.6 F (36.4 C) 98 F (36.7 C)  SpO2: 97% 93%    Physical Exam: General:  NGT Lungs:  nonlabored Incisions:  bilateral groin incisions intact with wound vac. L AKA intact and dry Extremities:  R PT/DP doppler signal  CBC    Component Value Date/Time   WBC 9.1 08/20/2022 0645   RBC 2.75 (L) 08/20/2022 0645   HGB 8.1 (L) 08/20/2022 0645   HGB 14.3 06/20/2012 2127   HCT 26.6 (L) 08/20/2022 0645   HCT 42.9 06/20/2012 2127   PLT 598 (H) 08/20/2022 0645   PLT 357 06/20/2012 2127   MCV 96.7 08/20/2022 0645   MCV 91 06/20/2012 2127   MCH 29.5 08/20/2022 0645   MCHC 30.5 08/20/2022 0645   RDW 14.8 08/20/2022 0645   RDW 13.6 06/20/2012 2127   LYMPHSABS 1.0 08/20/2022 0645   MONOABS 0.8 08/20/2022 0645   EOSABS 0.2 08/20/2022 0645   BASOSABS 0.1 08/20/2022 0645    BMET    Component Value Date/Time   NA 136 08/20/2022 0645   NA 135 (L) 06/20/2012 2127   K 4.2 08/20/2022 0645   K 4.0 06/20/2012 2127   CL 102 08/20/2022 0645   CL  100 06/20/2012 2127   CO2 28 08/20/2022 0645   CO2 27 06/20/2012 2127   GLUCOSE 253 (H) 08/20/2022 0645   GLUCOSE 250 (H) 06/20/2012 2127   BUN 31 (H) 08/20/2022 0645   BUN 20 (H) 06/20/2012 2127   CREATININE 0.77 08/20/2022 0645   CREATININE 0.60 06/20/2012 2127   CALCIUM 8.5 (L) 08/20/2022 0645   CALCIUM 8.8 06/20/2012 2127   GFRNONAA >60 08/20/2022 0645   GFRNONAA >60 06/20/2012 2127   GFRAA >60 06/20/2012 2127    INR    Component Value Date/Time   INR 1.0 08/03/2022 2156     Intake/Output Summary (Last 24 hours) at 08/20/2022 0742 Last data filed at 08/20/2022 0650 Gross per 24 hour  Intake 120 ml  Output 1600 ml  Net -1480 ml      Assessment/Plan:  71 y.o. female is 6 days post op, s/p:  bilateral iliofemoral endarterectomies, lower extremity embolectomies with fasciotomies, L AKA and bilateral groin washout with wound vac placement    -RLE warm and well perfused with PT/DP doppler signals. L AKA intact and dry -Bilateral groin incisions with wound vac with good seal. Wound vac change  with WOC today -Continue IV Zosyn   Vicente Serene, PA-C [facility] [phone] 08/20/2022 7:42 AM

## 2022-08-20 NOTE — Consult Note (Signed)
[place] Nurse wound follow up Wound type: surgical x 2; bilateral groin Wound TRV:UYEB are clean, early granulation, small amount of lymphatic drainage present left groin Measurements:  R groin 7 cms x 4 cms x 3 cms; L groin 5 cms x 5 cms x 3 cms  Drainage (amount, consistency, odor) serosanguinous  Periwound: L intact blistering at the distal aspect of wound edge, inner left lateral thigh serous filled blister; R blistering distal aspect of wound edge as well, serous filled.  May be related to contact dermatitis from NPWT drape.  Applied skin prep to area before new drape placed.  Dressing procedure/placement/frequency: Removed old NPWT dressings from each groin wound; 1pc white from the left/1pc black from the left groin 1pc white from the right groin/1pc black from the right groin   Periwound skin protected with skin barrier, using ostomy barrier ring at wound edges in skin/groin folds to aid in seal Right groin: filled wound with , ___1_ piece of white foam, _1__ piece of black foam Left groin: filled with 1 piece of white foam and 1 piece of black foam  Sealed NPWT dressing at 1105m HG Patient received IV  pain medication per bedside nurse prior to dressing change Patient tolerated procedure well   NPWT dressing to be changed by VVS on Friday 08/22/2022.    Thanks,  HSmur[employer]Stone ContainerMSN, RN-BC, [employer]

## 2022-08-20 NOTE — Progress Notes (Signed)
PROGRESS NOTE  Heather [name]  DOB: [birth year]  PCP: [name], [place] TMH:[id number]  DOA: 08/04/2022  LOS: 74 days  Hospital Day: 17  Brief narrative: Heather [name] is a 71 y.o. female with PMH significant for DM2, neuropathy, HTN, HLD, COPD, GERD, PAD, s/p TAVR, chronic diastolic CHF, anxiety/depression/schizophrenia, tobacco abuse,  12/11, patient presented to ED with sudden onset of dizziness, blurred vision, headache, nausea and vomiting.  Blood pressure was significantly elevated to over 921 systolic with EMS. She was admitted to ICU for hemorrhagic stroke and bilateral lower extremity acute on chronic ischemia.   Vascular surgery consulted and s/p bilateral lower extremity femoral endarterectomy and patch angioplasty with iliac stenting, 4 compartment fasciotomies for acute on chronic lower extremity ischemia.  Postop remained on vent and Cleviprex.   Transferred to floor and TRH on 12/17.   Neurology and vascular surgery following.    Subjective: Patient was seen and examined this afternoon.  Elderly Caucasian female.  Propped up in bed.  Not in distress.  Not on supplemental oxygen.  Poor oral appetite.  Has NG tube with nightly feeding. Daughter at bedside.  Another daughter on the phone. Chart reviewed Hemodynamically stable  Assessment and plan: Bilateral lower extremity acute on chronic ischemia: -s/p bilateral lower extremity femoral endarterectomy and patch angioplasty with iliac stenting, 4 compartment fasciotomies. Post op care per vasc surgery  -CK level decreasing, 6462 on 12/16, down to 3379 on 12/19. -Vascular surgery follow-up appreciated.  LLE with no palpable or dopplerable pulses, cool foot to touch with mottling of the toes, reportedly worse than before.   - Anticipating she will get an AKA on 12/21 as discussed today with Dr. Donzetta Matters, VVS. -IV heparin discontinued 12/16 and now on aspirin 81 Mg daily and Plavix 75 Mg daily.  See discussion below regarding  antiplatelets. -Discussed with Dr. Leonie Man, neurology on 12/19.  From a stroke perspective, patient only needs a single antiplatelet agent.  If vascular surgery wishes to continue Plavix then he recommends discontinuing aspirin.  Communicated with Dr. Donzetta Matters, [place] to continue Plavix. ASA DC'ed. 12/20 vascular surgery plans LEFT AKA on 12/21 11/22 groin abscess positive moderate GNR/rare Proteus Mirabilis  12/22 DC vancomycin, continue Zosyn x 7 days 12/25 narrowed antibiotic coverage see above 12/26 wound VAC replaced again secondary to continued leaking   Acute cerebellar vermis intraparenchymal hemorrhage  Code stroke CT: Acute intraparenchymal hematoma within the cerebellar vermis measuring 11 cc in volume and mild mass effect upon the fourth ventricle. Negative stroke per CT/MRI scan.  Small ICH see below  was on aspirin 81 Mg daily + Plavix 75 Mg daily PTA, then here on IV heparin per stroke protocol which was switched to aspirin 81 Mg daily on 12/17.  Remains on Plavix 75 Mg daily as well. As per therapy recommendations, SNF when medically optimized. Neurology signed off 12/18.   Please see above regarding discussion with Dr. Leonie Man on 12/19 regarding antiplatelets. 12/20 negative neurological deficit 12/25 stable   AMS  12/23 PAGED to patient's bedside secondary to increased WBC, AMS.  Patient was diaphoretic inappropriately responsive, tachycardic.  After~5 minutes post nebulizer treatment began to answer some questions appropriately but definite change from this morning.  12/23 obtain repeat CT head: Extension of ICH?  12/23 obtain EEG, dependent upon findings will consult neurology. 12/23 seizure precautions 12/24 discussed case at length with Dr. Alferd Patee neurology and he is familiar with patient, states low suspicion for seizure activity last night. 12/25 resolved   Acute respiratory failure  with hypoxia/Pulmonary edema/Pneumonia Secondary to acute cerebellar hemorrhagic stroke and  postop Extubated 12/14. 12/20 on room air 12/23 acute pulmonary edema: Titrate O2 to maintain SpO2> 93% 12/23 continue Zosyn minimal 7 days.  See bilateral lower extremity ischemia 12/23 flutter valve  12/23 incentive spirometry  12/23 DuoNeb QID 12/23 Revefenacin (Yupelri) 12/23 DuoNeb QID 12/23 Albumin  50 gx 1 12/25 stable   Hx of COPD with tobacco abuse. -triple therapy nebs  -No clinical exacerbation at this time.   Hx of depression, bipolar, schizophrenia, insomnia. - trazodone at night    HTN emergency. Coreg 12.5 mg BID Hydralazine PRN Labetalol PRN 12/20 BP currently controlled   Hx of CAD, chronic HFpEF, HLD, HTN, s/p TAVR. - goal SBP <160 -Antiplatelet management as above.  Cozaar being held due to AKI. - holding lipitor in setting of elevated CK  - Got a couple doses of IV Lasix in ICU. 12/20 echocardiogram does not support diagnosis of HFpEF Troponins never trended.  Patient currently without CP Strict in and out +11.7 L  Daily weight    AKI from rhabdomyolysis. Trend CK   Latest Reference Range & Units 08/12/22 04:20 08/13/22 02:18 08/16/22 04:35 08/17/22 00:42 08/18/22 05:13  CK Total 38 - 234 U/L 3,379 (H) 2,499 (H) 719 (H) 442 (H) 179  (H): Data is abnormally high 12/22 increase LR 119m/hr 12/23 LR stopped this a.m. secondary to increased WOB/PCXR showing pulmonary edema and small effusion see PCXR 12/23 will need to restart fluids later this afternoon at slower rate. 12/25 resolved   Non-gap metabolic acidosis. -AKI and metabolic acidosis resolved Resolved.   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 12/12 hemoglobin A1c= 12.9  12/23 increase Semglee 22 units daily  12/26 decrease NovoLog 5 units q 4 hrs  12/26 decrease moderate SSI   DM neuropathy. See DM type II   Anemia of critical illness. - f/u CBC - transfuse for hgb <7 -Hemoglobin has dropped from 8.5-7.9 >7.6>7.7 in the absence of overt bleeding.  Follow CBC in a.m. and  transfuse if hemoglobin 7 g or less. 12/20 transfuse 2 unit PRBC   Dysphagia: Likely related to hemorrhagic stroke Has left nasal core track with ongoing tube feeds. Failed swallow evaluation on 12/16. As per SLP follow-up, completed FEES and recommended dysphagia to 2 diet with thin liquids on 12/18. 12/20 positive anorexia 12/26 resolved patient now eating    Stage II sacral decubitus ulcer  Goals of care   Code Status: Full Code    Mobility: Encourage ambulation  Scheduled Meds:  (feeding supplement) PROSource Plus  30 mL Oral BID BM   arformoterol  15 mcg Nebulization BID   atorvastatin  80 mg Per Tube Daily   budesonide (PULMICORT) nebulizer solution  0.5 mg Nebulization BID   carvedilol  12.5 mg Per Tube BID WC   Chlorhexidine Gluconate Cloth  6 each Topical Q0600   clopidogrel  75 mg Oral Daily   docusate  100 mg Per Tube BID   escitalopram  10 mg Per Tube Daily   feeding supplement (PIVOT 1.5 CAL)  1,000 mL Per Tube Q24H   heparin injection (subcutaneous)  5,000 Units Subcutaneous Q8H   insulin aspart  0-15 Units Subcutaneous Q4H   insulin aspart  5 Units Subcutaneous Q4H   insulin glargine-yfgn  22 Units Subcutaneous Daily   ipratropium-albuterol  3 mL Nebulization TID   metroNIDAZOLE  500 mg Per Tube Q12H   mirtazapine  7.5 mg Per Tube QHS   multivitamin  with minerals  1 tablet Per Tube Daily   nicotine  14 mg Transdermal Daily   mouth rinse  15 mL Mouth Rinse Q4H   oxyCODONE  10 mg Oral Q12H   pantoprazole  40 mg Oral Daily   revefenacin  175 mcg Nebulization Daily   sodium chloride flush  10-40 mL Intracatheter Q12H    PRN meds: acetaminophen **OR** acetaminophen (TYLENOL) oral liquid 160 mg/5 mL **OR** acetaminophen, albuterol, hydrALAZINE, HYDROmorphone (DILAUDID) injection, labetalol, ondansetron (ZOFRAN) IV, mouth rinse, oxyCODONE, sodium chloride flush, traZODone   Infusions:    ceFAZolin (ANCEF) IV 2 g (08/20/22 1500)    Skin assessment:   Pressure Injury 08/04/22 Sacrum Stage 2 -  Partial thickness loss of dermis presenting as a shallow open injury with a red, pink wound bed without slough. (Active)  08/04/22 0100  Location: Sacrum  Location Orientation:   Staging: Stage 2 -  Partial thickness loss of dermis presenting as a shallow open injury with a red, pink wound bed without slough.  Wound Description (Comments):   Present on Admission: Yes    Nutritional status:  Body mass index is 29.84 kg/m.  Nutrition Problem: Increased nutrient needs Etiology: wound healing Signs/Symptoms: estimated needs     Diet:  Diet Order             DIET - DYS 1 Room service appropriate? Yes; Fluid consistency: Thin  Diet effective now                   DVT prophylaxis:  heparin injection 5,000 Units Start: 08/10/22 1600   Antimicrobials: Ancef, Flagyl Fluid: None Consultants: Vascular surgery Family Communication: Daughter at bedside and on the phone  Status is: Inpatient  Continue in-hospital care because: Pending clinical improvement Level of care: Telemetry Medical   Dispo: The patient is from: Home              Anticipated d/c is to: SNF              Patient currently is not medically stable to d/c.   Difficult to place patient No    Antimicrobials: Anti-infectives (From admission, onward)    Start     Dose/Rate Route Frequency Ordered Stop   08/20/22 2200  metroNIDAZOLE (FLAGYL) IVPB 500 mg  Status:  Discontinued        500 mg 100 mL/hr over 60 Minutes Intravenous Every 12 hours 08/20/22 1110 08/20/22 1111   08/20/22 2200  metroNIDAZOLE (FLAGYL) tablet 500 mg        500 mg Per Tube Every 12 hours 08/20/22 1111     08/18/22 2300  ceFAZolin (ANCEF) IVPB 2g/100 mL premix        2 g 200 mL/hr over 30 Minutes Intravenous Every 8 hours 08/18/22 1455     08/18/22 2300  metroNIDAZOLE (FLAGYL) IVPB 500 mg  Status:  Discontinued        500 mg 100 mL/hr over 60 Minutes Intravenous Every 6 hours 08/18/22 1455  08/20/22 1110   08/15/22 1100  vancomycin (VANCOCIN) IVPB 1000 mg/200 mL premix  Status:  Discontinued        1,000 mg 200 mL/hr over 60 Minutes Intravenous Every 24 hours 08/14/22 1006 08/15/22 1307   08/14/22 1400  piperacillin-tazobactam (ZOSYN) IVPB 3.375 g  Status:  Discontinued        3.375 g 12.5 mL/hr over 240 Minutes Intravenous Every 8 hours 08/14/22 1006 08/18/22 1455   08/14/22 1100  vancomycin (VANCOREADY) IVPB 1500  mg/300 mL        1,500 mg 150 mL/hr over 120 Minutes Intravenous  Once 08/14/22 1006 08/14/22 1502   08/04/22 1045  ceFAZolin (ANCEF) IVPB 2g/100 mL premix        2 g 200 mL/hr over 30 Minutes Intravenous On call to O.R. 08/04/22 0958 08/04/22 1100       Objective: Vitals:   08/20/22 1222 08/20/22 1718  BP: (!) 114/51 (!) 110/51  Pulse: 85 84  Resp: 15 15  Temp: 98.3 F (36.8 C) 98.3 F (36.8 C)  SpO2: 91% 99%    Intake/Output Summary (Last 24 hours) at 08/20/2022 1801 Last data filed at 08/20/2022 0650 Gross per 24 hour  Intake 120 ml  Output 1600 ml  Net -1480 ml   Filed Weights   08/17/22 0500 08/19/22 0420 08/20/22 0500  Weight: 71.6 kg 66.2 kg 69.3 kg   Weight change: 3.1 kg Body mass index is 29.84 kg/m.   Physical Exam: General exam: Pleasant, elderly Caucasian female.  Not in pain Skin: No rashes, lesions or ulcers. HEENT: Atraumatic, normocephalic, no obvious bleeding Lungs: Clear to auscultation bilaterally CVS: Regular rate and rhythm, no murmur GI/Abd soft, nondistended, nontender, bowel sound present CNS: Alert, awake, oriented x 3 Psychiatry: Mood appropriate Extremities: No pedal edema, no calf tenderness  Data Review: I have personally reviewed the laboratory data and studies available.  F/u labs ordered Unresulted Labs (From admission, onward)     Start     Ordered   08/18/22 0500  CBC with Differential/Platelet  Daily,   R     Question:  Specimen collection method  Answer:  Lab=Lab collect   08/17/22 1947    08/18/22 0500  CK total and CKMB (cardiac)not at [facility]  Daily,   R     Question:  Specimen collection method  Answer:  Lab=Lab collect   08/17/22 1947   08/18/22 0500  Comprehensive metabolic panel  Daily,   R     Question:  Specimen collection method  Answer:  Lab=Lab collect   08/17/22 1947   08/18/22 0500  Magnesium  Daily,   R     Question:  Specimen collection method  Answer:  Lab=Lab collect   08/17/22 1947   08/18/22 0500  Phosphorus  Daily,   R     Question:  Specimen collection method  Answer:  Lab=Lab collect   08/17/22 1947            Total time spent in review of labs and imaging, patient evaluation, formulation of plan, documentation and communication with family -67 minutes  Signed, Terrilee Croak, MD [facility] 08/20/2022

## 2022-08-20 NOTE — Progress Notes (Signed)
Occupational Therapy Treatment Patient Details Name: Heather [name] MRN: [id number] DOB: 1951/01/10 Today's Date: 08/20/2022   History of present illness [name] is a 72 y.o. 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  Found to have mottled LE's after given Narcdipine due to hypertention and went for emergent common femoral and iliofemoral endarterectomy and bilateral 4 compartment fasciotomies on 08/04/22. 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 History of smoker(2ppd), severe AI/AS s/p TAVR, CAD s/p PCI and on DAPT, COPD, PAD, HTN   OT comments  Patient received in supine and agreeable to OT treatment and transfer to recliner. Patient required max assist to get to EOB with use of bed pads and demonstrated poor trunk strength. Patient was total assist of 2 for transfer to recliner. Patient performed grooming seated in recliner and was found to have soiled herself. Patient was assisted back to bed with total assist of 2.  Discharge recommendations for SNF continues to be appropriate due to current skill level.    Recommendations for follow up therapy are one component of a multi-disciplinary discharge planning process, led by the attending physician.  Recommendations may be updated based on patient status, additional functional criteria and insurance authorization.    Follow Up Recommendations  Skilled nursing-short term rehab (<3 hours/day)     Assistance Recommended at Discharge Frequent or constant Supervision/Assistance  Patient can return home with the following  Two people to help with walking and/or transfers;A lot of help with bathing/dressing/bathroom;Assistance with cooking/housework;Assistance with feeding;Direct supervision/assist for medications management;Direct supervision/assist for financial management;Assist for transportation;Help with stairs or ramp for  entrance   Equipment Recommendations  None recommended by OT (defer to next venue)    Recommendations for Other Services      Precautions / Restrictions Precautions Precautions: Fall;Other (comment) Precaution Comments: bilateral groin wound vac Restrictions Weight Bearing Restrictions: Yes RLE Weight Bearing: Weight bearing as tolerated LLE Weight Bearing: Non weight bearing       Mobility Bed Mobility Overal bed mobility: Needs Assistance Bed Mobility: Rolling, Sit to Supine, Supine to Sit Rolling: Max assist   Supine to sit: Max assist Sit to supine: Max assist, +2 for physical assistance   General bed mobility comments: poor trunk strength. use of bed pads to aide in getting to EOB    Transfers Overall transfer level: Needs assistance Equipment used: None Transfers: Bed to chair/wheelchair/BSC            Lateral/Scoot Transfers: +2 physical assistance, Total assist General transfer comment: assistance with bed pad, posterior leaning during transfer     Balance Overall balance assessment: Needs assistance Sitting-balance support: Feet unsupported, Bilateral upper extremity supported Sitting balance-Leahy Scale: Poor Sitting balance - Comments: min to mod assist for sitting balance due to posterior leaning and poor trunk control Postural control: Posterior lean                                 ADL either performed or assessed with clinical judgement   ADL Overall ADL's : Needs assistance/impaired     Grooming: Wash/dry hands;Wash/dry face;Oral care;Minimal assistance;Sitting Grooming Details (indicate cue type and reason): in recliner  Extremity/Trunk Assessment              Vision       Perception     Praxis      Cognition Arousal/Alertness: Awake/alert Behavior During Therapy: Flat affect Overall Cognitive Status: Impaired/Different from baseline Area of Impairment:  Attention, Memory, Following commands, Safety/judgement, Awareness, Problem solving                   Current Attention Level: Sustained Memory: Decreased short-term memory Following Commands: Follows one step commands with increased time, Follows one step commands inconsistently Safety/Judgement: Decreased awareness of safety, Decreased awareness of deficits Awareness: Emergent Problem Solving: Requires verbal cues, Requires tactile cues, Slow processing, Decreased initiation, Difficulty sequencing General Comments: slow processing        Exercises      Shoulder Instructions       General Comments      Pertinent Vitals/ Pain       Pain Assessment Pain Assessment: Faces Faces Pain Scale: Hurts even more Pain Location: generalized with movement; bilateral groin Pain Descriptors / Indicators: Aching, Discomfort, Grimacing, Guarding Pain Intervention(s): Limited activity within patient's tolerance, Monitored during session, Repositioned  Home Living                                          Prior Functioning/Environment              Frequency  Min 2X/week        Progress Toward Goals  OT Goals(current goals can now be found in the care plan section)  Progress towards OT goals: Progressing toward goals  Acute Rehab OT Goals Patient Stated Goal: none stated OT Goal Formulation: With patient Time For Goal Achievement: 08/22/22 Potential to Achieve Goals: Good ADL Goals Pt Will Perform Lower Body Bathing: with min assist;sit to/from stand;sitting/lateral leans Pt Will Perform Lower Body Dressing: with min assist;sitting/lateral leans;sit to/from stand Pt Will Transfer to Toilet: with min assist;with +2 assist;bedside commode;stand pivot transfer Pt Will Perform Toileting - Clothing Manipulation and hygiene: with min assist;sitting/lateral leans;sit to/from stand Additional ADL Goal #1: Patient will be able to sit EOB without need for  external assist as a precursor to OOB activites.  Plan Discharge plan remains appropriate    Co-evaluation                 AM-PAC OT "6 Clicks" Daily Activity     Outcome Measure   Help from another person eating meals?: A Little Help from another person taking care of personal grooming?: A Little Help from another person toileting, which includes using toliet, bedpan, or urinal?: A Lot Help from another person bathing (including washing, rinsing, drying)?: A Lot Help from another person to put on and taking off regular upper body clothing?: A Lot Help from another person to put on and taking off regular lower body clothing?: Total 6 Click Score: 13    End of Session Equipment Utilized During Treatment: Oxygen  OT Visit Diagnosis: Unsteadiness on feet (R26.81);Other abnormalities of gait and mobility (R26.89);Muscle weakness (generalized) (M62.81);Pain Pain - Right/Left: Left Pain - part of body: Leg   Activity Tolerance Patient limited by pain   Patient Left in bed;with call bell/phone within reach;with nursing/sitter in room   Nurse Communication Mobility status        Time: 9201-0071 OT Time Calculation (min): 18 min  Charges: OT  General Charges $OT Visit: 1 Visit OT Treatments $Self Care/Home Management : 8-22 mins  Lodema Hong, [place]  Office [phone]   Trixie Dredge 08/20/2022, 1:21 PM

## 2022-08-21 DIAGNOSIS — I614 Nontraumatic intracerebral hemorrhage in cerebellum: Secondary | ICD-10-CM | POA: Diagnosis not present

## 2022-08-21 LAB — CBC WITH DIFFERENTIAL/PLATELET
Abs Immature Granulocytes: 0.13 10*3/uL — ABNORMAL HIGH (ref 0.00–0.07)
Basophils Absolute: 0.1 10*3/uL (ref 0.0–0.1)
Basophils Relative: 1 %
Eosinophils Absolute: 0.2 10*3/uL (ref 0.0–0.5)
Eosinophils Relative: 2 %
HCT: 27.6 % — ABNORMAL LOW (ref 36.0–46.0)
Hemoglobin: 8.7 g/dL — ABNORMAL LOW (ref 12.0–15.0)
Immature Granulocytes: 1 %
Lymphocytes Relative: 12 %
Lymphs Abs: 1.2 10*3/uL (ref 0.7–4.0)
MCH: 30.2 pg (ref 26.0–34.0)
MCHC: 31.5 g/dL (ref 30.0–36.0)
MCV: 95.8 fL (ref 80.0–100.0)
Monocytes Absolute: 0.8 10*3/uL (ref 0.1–1.0)
Monocytes Relative: 8 %
Neutro Abs: 7.5 10*3/uL (ref 1.7–7.7)
Neutrophils Relative %: 76 %
Platelets: 671 10*3/uL — ABNORMAL HIGH (ref 150–400)
RBC: 2.88 MIL/uL — ABNORMAL LOW (ref 3.87–5.11)
RDW: 14.8 % (ref 11.5–15.5)
WBC: 9.9 10*3/uL (ref 4.0–10.5)
nRBC: 0 % (ref 0.0–0.2)

## 2022-08-21 LAB — GLUCOSE, CAPILLARY
Glucose-Capillary: 201 mg/dL — ABNORMAL HIGH (ref 70–99)
Glucose-Capillary: 224 mg/dL — ABNORMAL HIGH (ref 70–99)
Glucose-Capillary: 201 mg/dL — ABNORMAL HIGH (ref 70–99)
Glucose-Capillary: 178 mg/dL — ABNORMAL HIGH (ref 70–99)
Glucose-Capillary: 181 mg/dL — ABNORMAL HIGH (ref 70–99)
Glucose-Capillary: 203 mg/dL — ABNORMAL HIGH (ref 70–99)

## 2022-08-21 LAB — CK TOTAL AND CKMB (NOT AT ARMC)
CK, MB: 1.6 ng/mL (ref 0.5–5.0)
Relative Index: INVALID (ref 0.0–2.5)
Total CK: 60 U/L (ref 38–234)

## 2022-08-21 LAB — COMPREHENSIVE METABOLIC PANEL
ALT: 8 U/L (ref 0–44)
AST: 14 U/L — ABNORMAL LOW (ref 15–41)
Albumin: 2 g/dL — ABNORMAL LOW (ref 3.5–5.0)
Alkaline Phosphatase: 125 U/L (ref 38–126)
Anion gap: 6 (ref 5–15)
BUN: 31 mg/dL — ABNORMAL HIGH (ref 8–23)
CO2: 30 mmol/L (ref 22–32)
Calcium: 8.6 mg/dL — ABNORMAL LOW (ref 8.9–10.3)
Chloride: 101 mmol/L (ref 98–111)
Creatinine, Ser: 0.71 mg/dL (ref 0.44–1.00)
GFR, Estimated: >60 mL/min (ref >60)
Glucose, Bld: 250 mg/dL — ABNORMAL HIGH (ref 70–99)
Potassium: 4.7 mmol/L (ref 3.5–5.1)
Sodium: 137 mmol/L (ref 135–145)
Total Bilirubin: 0.4 mg/dL (ref 0.3–1.2)
Total Protein: 5.6 g/dL — ABNORMAL LOW (ref 6.5–8.1)

## 2022-08-21 LAB — PHOSPHORUS: Phosphorus: 2.6 mg/dL (ref 2.5–4.6)

## 2022-08-21 LAB — MAGNESIUM: Magnesium: 2 mg/dL (ref 1.7–2.4)

## 2022-08-21 MED ORDER — INSULIN GLARGINE-YFGN 100 UNIT/ML ~~LOC~~ SOLN
25.0000 [IU] | Freq: Every day | SUBCUTANEOUS | Status: DC
  Administered 2022-08-22: 25 [IU] via SUBCUTANEOUS
  Filled 2022-08-21 (×2): qty 0.25

## 2022-08-21 MED ORDER — IPRATROPIUM-ALBUTEROL 0.5-2.5 (3) MG/3ML IN SOLN
3.0000 mL | Freq: Two times a day (BID) | RESPIRATORY_TRACT | Status: DC
  Administered 2022-08-21 – 2022-08-22 (×3): 3 mL via RESPIRATORY_TRACT
  Filled 2022-08-21 (×4): qty 3

## 2022-08-21 NOTE — Progress Notes (Addendum)
  Progress Note  VASCULAR SURGERY ASSESSMENT & PLAN:   OPEN GROIN WOUNDS: Continue VAC changes 3 times a week.  Appreciate wound care team's help with VAC changes.   POD 4 LEFT AKA: Her amputation site is healing nicely.  Continue daily dressing changes.   NUTRITION: She is getting tube feeds.   ID: She is on IV Zosyn.  Her groin wound grew moderate E. coli and rare Proteus mirabilis.   DVT PROPHYLAXIS: She is getting subcu heparin.    Gae Gallop, MD 12:23 PM    08/21/2022 7:40 AM 7 Days Post-Op  Subjective:  feeling less pain in her groins today  Vitals:   08/20/22 2307 08/21/22 0315  BP: (!) 128/47 (!) 150/53  Pulse: 79 84  Resp: 20 14  Temp: 98.4 F (36.9 C) 98.4 F (36.9 C)  SpO2: 95% 96%   Physical Exam: General:  NGT Lungs:  nonlabored Incisions:  bilateral groins with wound vac with good seal. RLE fasciotomy intact with staples. L AKA intact with staples Extremities:  R DP/PT doppler signals  CBC    Component Value Date/Time   WBC 9.9 08/21/2022 0530   RBC 2.88 (L) 08/21/2022 0530   HGB 8.7 (L) 08/21/2022 0530   HGB 14.3 06/20/2012 2127   HCT 27.6 (L) 08/21/2022 0530   HCT 42.9 06/20/2012 2127   PLT 671 (H) 08/21/2022 0530   PLT 357 06/20/2012 2127   MCV 95.8 08/21/2022 0530   MCV 91 06/20/2012 2127   MCH 30.2 08/21/2022 0530   MCHC 31.5 08/21/2022 0530   RDW 14.8 08/21/2022 0530   RDW 13.6 06/20/2012 2127   LYMPHSABS 1.2 08/21/2022 0530   MONOABS 0.8 08/21/2022 0530   EOSABS 0.2 08/21/2022 0530   BASOSABS 0.1 08/21/2022 0530    BMET    Component Value Date/Time   NA 137 08/21/2022 0530   NA 135 (L) 06/20/2012 2127   K 4.7 08/21/2022 0530   K 4.0 06/20/2012 2127   CL 101 08/21/2022 0530   CL 100 06/20/2012 2127   CO2 30 08/21/2022 0530   CO2 27 06/20/2012 2127   GLUCOSE 250 (H) 08/21/2022 0530   GLUCOSE 250 (H) 06/20/2012 2127   BUN 31 (H) 08/21/2022 0530   BUN 20 (H) 06/20/2012 2127   CREATININE 0.71 08/21/2022 0530    CREATININE 0.60 06/20/2012 2127   CALCIUM 8.6 (L) 08/21/2022 0530   CALCIUM 8.8 06/20/2012 2127   GFRNONAA >60 08/21/2022 0530   GFRNONAA >60 06/20/2012 2127   GFRAA >60 06/20/2012 2127    INR    Component Value Date/Time   INR 1.0 08/03/2022 2156     Intake/Output Summary (Last 24 hours) at 08/21/2022 0740 Last data filed at 08/20/2022 2000 Gross per 24 hour  Intake 100 ml  Output --  Net 100 ml   Assessment/Plan:  71 y.o. female is 7 days post op,s/p: bilateral iliofemoral endarterectomies, lower extremity embolectomies with fasciotomies, L AKA and bilateral groin washout with wound vac placement    -L AKA intact and dry. RLE fasciotomy intact with staples.  -R DP/PT doppler signals -Bilateral groin vacs were changed by WOC yesterday with report of healthy early granulation tissue. Due to [facility] shortage, our VVS team will change the patient's groins vacs at beside tomorrow, 12/29   Vicente Serene, PA-C [facility] [phone] 08/21/2022 7:40 AM

## 2022-08-21 NOTE — Progress Notes (Signed)
Calorie Count Note  48 hour calorie count ordered. Day 1 results below:  Diet: Previously on Dysphagia 3 with thin liquids (no milk on tray) Yesterday afternoon changed to Dysphagia 1 with thin liquids (no milk on tray) by MD per request of family Supplements: Magic Cup po TID with meals, Nectar Mighty Shake TID with meals, PROSource Plus 30 mL BID  Lunch 12/27: bites of fish brought in by daughter (~23 kcal, ~1 gram protein) Dinner 12/28: 0% Breakfast: 0% Supplements: 30 mL PROSource Plus (100 kcal, 15 grams protein)  Total intake Day 1: 123 kcal (7% of minimum estimated needs)  16 grams of protein (16% of minimum estimated needs)  Estimated Nutritional Needs:  Kcal:  1700-1900 Protein:  100-125 grams Fluid:  >1.7 L/day  Nutrition Dx: Increased nutrient needs related to wound healing as evidenced by estimated needs.   Goal: Patient will meet greater than or equal to 90% of their needs   Intervention:  Continue 48 hour calorie count. Pending final results of calorie count, may need to extend tube feeds back to 24 hours to meet 100% estimated kcal needs.  Continue nocturnal tube feeds via Cortrak tube: -Provide Pivot 1.5 at 60 mL/hour x 16 hours from 1600-0800 (960 mL) -Provides: 1440 kcal, 90 grams of protein, 720 mL H2O daily -Meets 85% minimum estimated kcal needs and 90% minimum estimated protein needs  Continue providing minimum free water flush of 30 mL every 4 hours while tube feeds infusing to maintain patency.  Continue multivitamin with minerals daily per tube. This can be changed to oral once pt has improved oral intake.   Continue current oral nutrition supplements per discussion with SLP in secure chat on 12/27: -Continue Magic cup TID with meals, each supplement provides 290 kcal and 9 grams of protein -Provide Nectar Mighty Shake TID with meals, each supplement provides 330 kcal and 9 grams of protein. -Provide PROSource Plus 30 mL po BID, each supplement  provides 100 kcal and 15 grams of protein.  RD encouraged pt to eat well at meals and try oral nutrition supplements.  [name], MS, RD, LDN, CNSC Pager number available on Amion

## 2022-08-21 NOTE — Progress Notes (Addendum)
Speech Language Pathology Treatment: Dysphagia  Patient Details Name: Heather [name] MRN: [id number] DOB: Jun 01, 1951 Today's Date: 08/21/2022 Time: 5035-4656 SLP Time Calculation (min) (ACUTE ONLY): 7 min  Assessment / Plan / Recommendation Clinical Impression  Pt seen for ongoing dysphagia management.  Pt has had poor PO intake.  Today, pt tolerated thin liquid by straw, puree, and mechanical soft solids with no clinical s/s of aspiration.  Pt exhibited good oral clearance of soft solids today with only slightly prolonged oral phase. Oral mucosa moist and pink.  Pt c/o 8 out of 10 leg pain and requested pain medication.  RN notified.  Pt declined further POs at this time.  Pt's diet has been changed to puree per family preference, but pt appears safe to continue a mechanical soft diet if desired. Will not advance diet at this time, but pt may advance up to mechanical soft solids.  Pt might have increased PO intake with more appetizing texture.  Pt is not exhibiting difficulty/safety concerns with POs offered today, just decreased desire to eat at this time.  Pt may advance diet texture up to mechanical soft as desired.  Recommend continuing thin liquids with milk precautions.    HPI HPI: 71 yo female smoker presented to ER with sudden onset of dizziness, blurred vision, headache, nausea and vomiting.  BP in EMS 209/76.  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  Also found to have bilateral leg pain with numbness and coolness.  Found to have occlusion of b/l common iliac artery stents, Lt SFA and popliteal stent and vascular surgery consulted.  Taken to OR for b/l common femoral endarterectomy, iliofemoral endarterectomy, lower extremity embolectomy and stent of b/l common iliac arteries and Rt external iliac artery, and b/l 4 compartment fasciotomies.  Remained on vent and cleviprex post op. 12/11-12/14.      SLP Plan  Continue with current plan of care       Recommendations for follow up therapy are one component of a multi-disciplinary discharge planning process, led by the attending physician.  Recommendations may be updated based on patient status, additional functional criteria and insurance authorization.    Recommendations  Diet recommendations: Dysphagia 3 (mechanical soft);Thin liquid - with milk precautions: no thin liquid dairy (milk, ice cream, milkshakes, milk-like supplements, etc) Liquids provided via: Cup;Straw Medication Administration: Crushed with puree Supervision: Patient able to self feed Compensations: Slow rate;Small sips/bites Postural Changes and/or Swallow Maneuvers: Seated upright 90 degrees                Oral Care Recommendations: Oral care BID Follow Up Recommendations: Skilled nursing-short term rehab (<3 hours/day) Assistance recommended at discharge: PRN SLP Visit Diagnosis: Dysphagia, oropharyngeal phase (R13.12) Plan: Continue with current plan of care           Celedonio Savage, [place], [place] Office: [phone] 08/21/2022, 12:30 PM

## 2022-08-21 NOTE — Progress Notes (Addendum)
PROGRESS NOTE  Heather [name]  DOB: 08/11/1951  PCP: [name], [place] RKY:[id number]  DOA: 08/04/2022  LOS: 1 days  Hospital Day: 18  Brief narrative: Heather [name] is a 71 y.o. female with PMH significant for DM2, neuropathy, HTN, HLD, COPD, GERD, PAD, AS s/p TAVR, chronic diastolic CHF, anxiety/depression/schizophrenia, tobacco abuse. 12/11, patient presented to ED with sudden onset of dizziness, blurred vision, headache, nausea and vomiting.  Blood pressure was significantly elevated to over 315 systolic with EMS. She was admitted to ICU for hemorrhagic stroke and bilateral lower extremity acute on chronic ischemia.   Vascular surgery was consulted and s/p bilateral lower extremity femoral endarterectomy and patch angioplasty with iliac stenting, 4 compartment fasciotomies for acute on chronic lower extremity ischemia.  Postop remained on vent and Cleviprex.   Transferred to floor and TRH on 12/17.   Neurology and vascular surgery following.    Subjective: Patient was seen and examined this afternoon.  Propped up in bed.  Not in distress.   No complaints.  No family at bedside. Per nursing staff, diarrhea improving. Poor appetite.  Dietitian and speech therapy follow-up appreciated.  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  CK level was elevated to thousands.  Gradually downtrended. However LLE ischemia did not improve, patient started having rhabdomyolysis. Patient ultimately required left AKA on 12/24.  Wound culture grew E. coli and Proteus [name].   Currently on IV Ancef and IV Flagyl. Continue wound VAC changes 3 times a week per vascular surgery. Previously on heparin drip.  Currently on Plavix 75 Mg daily.    Acute  intracranial hemorrhage  Initial CT scan showed acute intraparenchymal hematoma within the cerebellar vermis measuring 11 cc in volume and mild mass effect upon the fourth ventricle.  Confirmed with an MRI. Neurology was consulted. Eventually patient was started on antiplatelet agents.  Currently on Plavix 75 mg daily.    Acute respiratory failure with hypoxia COPD, everyday smoker Extubated 12/14. Currently on low-flow oxygen. Continue Brovana twice daily, Pulmicort twice daily, Yupelri daily, albuterol PRN  Uncontrolled type 2 diabetes mellitus A1c 12.9 on 08/05/2022 Currently on Semglee 22 units daily, sliding scale insulin.  Blood sugar level running elevated consistently.  Increase Semglee to 25 units daily. Recent Labs  Lab 08/20/22 2002 08/20/22 2301 08/21/22 0348 08/21/22 0824 08/21/22 1159  GLUCAP 163* 216* 201* 224* 201*   Chronic diastolic CHF HTN  Received intermittent dialysis while in ICU.   Currently blood pressure controlled on Coreg 12.5 mg BID.  Hydralazine as needed. Echo 12/11 with EF 60 to 65%, no WMA, indeterminate diastolic parameters.   Hx of CAD, HLD AS s/p TAVR. On Plavix 75 mg daily, Lipitor 80 mg daily.  Acute anemia Received 3 units of PRBCs this hospitalization.  Hemoglobin running stable over 8 lately. Recent Labs    08/17/22 0042 08/18/22 0513 08/19/22 0534 08/20/22 0645 08/21/22 0530  HGB 8.7* 8.3* 8.0* 8.1* 8.7*  MCV 94.4 97.1 96.3 96.7 95.8     Hx of depression, bipolar, schizophrenia, insomnia. Currently on Lexapro daily, Remeron nightly, trazodone nightly.,    Dysphagia Likely related to hemorrhagic stroke Has left nasal core track with ongoing tube feeds. Speech therapy following.  Dysphagia diet currently.  Diarrhea 12/27, patient had significant loose bowel movement.  Noticed that she was not  scheduled bowel regimen.  I stopped it.  Goals of care   Code Status: Full Code    Mobility: Encourage ambulation  Scheduled  Meds:  (feeding supplement) PROSource Plus  30 mL Oral BID BM   arformoterol  15 mcg Nebulization BID   atorvastatin  80 mg Per Tube Daily   budesonide (PULMICORT) nebulizer solution  0.5 mg Nebulization BID   carvedilol  12.5 mg Per Tube BID WC   Chlorhexidine Gluconate Cloth  6 each Topical Q0600   clopidogrel  75 mg Oral Daily   escitalopram  10 mg Per Tube Daily   feeding supplement (PIVOT 1.5 CAL)  1,000 mL Per Tube Q24H   heparin injection (subcutaneous)  5,000 Units Subcutaneous Q8H   insulin aspart  0-15 Units Subcutaneous Q4H   insulin aspart  5 Units Subcutaneous Q4H   [START ON 08/22/2022] insulin glargine-yfgn  25 Units Subcutaneous Daily   ipratropium-albuterol  3 mL Nebulization BID   metroNIDAZOLE  500 mg Per Tube Q12H   mirtazapine  7.5 mg Per Tube QHS   multivitamin with minerals  1 tablet Per Tube Daily   nicotine  14 mg Transdermal Daily   mouth rinse  15 mL Mouth Rinse Q4H   oxyCODONE  10 mg Oral Q12H   pantoprazole  40 mg Oral Daily   revefenacin  175 mcg Nebulization Daily   sodium chloride flush  10-40 mL Intracatheter Q12H    PRN meds: acetaminophen **OR** acetaminophen (TYLENOL) oral liquid 160 mg/5 mL **OR** acetaminophen, albuterol, hydrALAZINE, HYDROmorphone (DILAUDID) injection, ondansetron (ZOFRAN) IV, mouth rinse, oxyCODONE, sodium chloride flush, traZODone   Infusions:    ceFAZolin (ANCEF) IV 2 g (08/21/22 1311)    Skin assessment:  Pressure Injury 08/04/22 Sacrum Stage 2 -  Partial thickness loss of dermis presenting as a shallow open injury with a red, pink wound bed without slough. (Active)  08/04/22 0100  Location: Sacrum  Location Orientation:   Staging: Stage 2 -  Partial thickness loss of dermis presenting as a shallow open injury with a red, pink wound bed without slough.  Wound Description (Comments):   Present on Admission: Yes    Nutritional status:  Body mass index is 29.84 kg/m.  Nutrition Problem: Increased nutrient  needs Etiology: wound healing Signs/Symptoms: estimated needs     Diet:  Diet Order             DIET - DYS 1 Room service appropriate? No; Fluid consistency: Thin  Diet effective now                   DVT prophylaxis:  heparin injection 5,000 Units Start: 08/10/22 1600   Antimicrobials: Ancef, Flagyl Fluid: None Consultants: Vascular surgery Family Communication: Family not at bedside today  Status is: Inpatient  Continue in-hospital care because: Pending clinical improvement Level of care: Telemetry Medical   Dispo: The patient is from: Home              Anticipated d/c is to: SNF              Patient currently is not medically stable to d/c.   Difficult to place patient No    Antimicrobials: Anti-infectives (From admission, onward)    Start     Dose/Rate Route Frequency Ordered Stop   08/20/22 2200  metroNIDAZOLE (FLAGYL) IVPB 500 mg  Status:  Discontinued        500 mg 100 mL/hr over 60 Minutes Intravenous Every 12 hours 08/20/22 1110 08/20/22  1111   08/20/22 2200  metroNIDAZOLE (FLAGYL) tablet 500 mg        500 mg Per Tube Every 12 hours 08/20/22 1111     08/18/22 2300  ceFAZolin (ANCEF) IVPB 2g/100 mL premix        2 g 200 mL/hr over 30 Minutes Intravenous Every 8 hours 08/18/22 1455     08/18/22 2300  metroNIDAZOLE (FLAGYL) IVPB 500 mg  Status:  Discontinued        500 mg 100 mL/hr over 60 Minutes Intravenous Every 6 hours 08/18/22 1455 08/20/22 1110   08/15/22 1100  vancomycin (VANCOCIN) IVPB 1000 mg/200 mL premix  Status:  Discontinued        1,000 mg 200 mL/hr over 60 Minutes Intravenous Every 24 hours 08/14/22 1006 08/15/22 1307   08/14/22 1400  piperacillin-tazobactam (ZOSYN) IVPB 3.375 g  Status:  Discontinued        3.375 g 12.5 mL/hr over 240 Minutes Intravenous Every 8 hours 08/14/22 1006 08/18/22 1455   08/14/22 1100  vancomycin (VANCOREADY) IVPB 1500 mg/300 mL        1,500 mg 150 mL/hr over 120 Minutes Intravenous  Once 08/14/22 1006  08/14/22 1502   08/04/22 1045  ceFAZolin (ANCEF) IVPB 2g/100 mL premix        2 g 200 mL/hr over 30 Minutes Intravenous On call to O.R. 08/04/22 0958 08/04/22 1100       Objective: Vitals:   08/21/22 0825 08/21/22 1157  BP: (!) 174/93 (!) 153/54  Pulse: (!) 104 80  Resp: 17 17  Temp: 99.8 F (37.7 C) 99.1 F (37.3 C)  SpO2: 95% 96%    Intake/Output Summary (Last 24 hours) at 08/21/2022 1510 Last data filed at 08/20/2022 2000 Gross per 24 hour  Intake 100 ml  Output --  Net 100 ml   Filed Weights   08/17/22 0500 08/19/22 0420 08/20/22 0500  Weight: 71.6 kg 66.2 kg 69.3 kg   Weight change:  Body mass index is 29.84 kg/m.   Physical Exam: General exam: Pleasant, elderly Caucasian female.  Not in pain Skin: No rashes, lesions or ulcers. HEENT: Atraumatic, normocephalic, no obvious bleeding. Lungs: Clear to auscultation bilaterally CVS: Regular rate and rhythm, no murmur GI/Abd soft, nondistended, nontender, bowel sound present CNS: Alert, awake, oriented x 3 Psychiatry: Sad affect Extremities: No pedal edema, no calf tenderness.  Left AKA stump clean.  Wound VAC in the groin  Data Review: I have personally reviewed the laboratory data and studies available.  F/u labs ordered Unresulted Labs (From admission, onward)     Start     Ordered   08/18/22 0500  CBC with Differential/Platelet  Daily,   R     Question:  Specimen collection method  Answer:  Lab=Lab collect   08/17/22 1947   08/18/22 0500  CK total and CKMB (cardiac)not at [facility]  Daily,   R     Question:  Specimen collection method  Answer:  Lab=Lab collect   08/17/22 1947   08/18/22 0500  Comprehensive metabolic panel  Daily,   R     Question:  Specimen collection method  Answer:  Lab=Lab collect   08/17/22 1947   08/18/22 0500  Magnesium  Daily,   R     Question:  Specimen collection method  Answer:  Lab=Lab collect   08/17/22 1947   08/18/22 0500  Phosphorus  Daily,   R     Question:  Specimen  collection method  Answer:  Lab=Lab  collect   08/17/22 1947            Total time spent in review of labs and imaging, patient evaluation, formulation of plan, documentation and communication with family 27 minutes  Signed, Terrilee Croak, MD [facility] 08/21/2022

## 2022-08-22 DIAGNOSIS — I614 Nontraumatic intracerebral hemorrhage in cerebellum: Secondary | ICD-10-CM | POA: Diagnosis not present

## 2022-08-22 LAB — GLUCOSE, CAPILLARY
Glucose-Capillary: 103 mg/dL — ABNORMAL HIGH (ref 70–99)
Glucose-Capillary: 191 mg/dL — ABNORMAL HIGH (ref 70–99)
Glucose-Capillary: 202 mg/dL — ABNORMAL HIGH (ref 70–99)
Glucose-Capillary: 247 mg/dL — ABNORMAL HIGH (ref 70–99)
Glucose-Capillary: 255 mg/dL — ABNORMAL HIGH (ref 70–99)
Glucose-Capillary: 308 mg/dL — ABNORMAL HIGH (ref 70–99)

## 2022-08-22 LAB — CBC WITH DIFFERENTIAL/PLATELET
Abs Immature Granulocytes: 0.09 10*3/uL — ABNORMAL HIGH (ref 0.00–0.07)
Basophils Absolute: 0.1 10*3/uL (ref 0.0–0.1)
Basophils Relative: 1 %
Eosinophils Absolute: 0.2 10*3/uL (ref 0.0–0.5)
Eosinophils Relative: 1 %
HCT: 26.8 % — ABNORMAL LOW (ref 36.0–46.0)
Hemoglobin: 8.7 g/dL — ABNORMAL LOW (ref 12.0–15.0)
Immature Granulocytes: 1 %
Lymphocytes Relative: 8 %
Lymphs Abs: 1 10*3/uL (ref 0.7–4.0)
MCH: 30.7 pg (ref 26.0–34.0)
MCHC: 32.5 g/dL (ref 30.0–36.0)
MCV: 94.7 fL (ref 80.0–100.0)
Monocytes Absolute: 1.1 10*3/uL — ABNORMAL HIGH (ref 0.1–1.0)
Monocytes Relative: 10 %
Neutro Abs: 9.4 10*3/uL — ABNORMAL HIGH (ref 1.7–7.7)
Neutrophils Relative %: 79 %
Platelets: 661 10*3/uL — ABNORMAL HIGH (ref 150–400)
RBC: 2.83 MIL/uL — ABNORMAL LOW (ref 3.87–5.11)
RDW: 15 % (ref 11.5–15.5)
WBC: 11.8 10*3/uL — ABNORMAL HIGH (ref 4.0–10.5)
nRBC: 0 % (ref 0.0–0.2)

## 2022-08-22 LAB — BASIC METABOLIC PANEL
Anion gap: 6 (ref 5–15)
BUN: 29 mg/dL — ABNORMAL HIGH (ref 8–23)
CO2: 31 mmol/L (ref 22–32)
Calcium: 8.6 mg/dL — ABNORMAL LOW (ref 8.9–10.3)
Chloride: 100 mmol/L (ref 98–111)
Creatinine, Ser: 0.73 mg/dL (ref 0.44–1.00)
GFR, Estimated: >60 mL/min (ref >60)
Glucose, Bld: 312 mg/dL — ABNORMAL HIGH (ref 70–99)
Potassium: 4.8 mmol/L (ref 3.5–5.1)
Sodium: 137 mmol/L (ref 135–145)

## 2022-08-22 MED ORDER — ENSURE ENLIVE PO LIQD
237.0000 mL | Freq: Three times a day (TID) | ORAL | Status: DC
  Administered 2022-08-22 – 2022-09-08 (×9): 237 mL via ORAL

## 2022-08-22 MED ORDER — HYDROMORPHONE HCL 1 MG/ML IJ SOLN
1.0000 mg | Freq: Once | INTRAMUSCULAR | Status: AC
  Administered 2022-08-22: 1 mg via INTRAVENOUS

## 2022-08-22 NOTE — Consult Note (Signed)
WOC Nurse Wound Follow-up:  Wound type: surgical x 2; bilateral groin Wound TJW:WZLY are clean, early granulation, small amount of lymphatic drainage present left groin Drainage (amount, consistency, odor) serosanguinous  Periwound: blistering unchanged  Dressing procedure/placement/frequency: Old NPWT dressings removed by vascular PA, WOC wound nurse in to replace.   Periwound skin protected with skin barrier, using ostomy barrier ring at wound edges in skin/groin folds to aid in seal Right groin: filled wound with , ___1_ piece of white foam, _1__ piece of black foam Left groin: filled with 1 piece of white foam and 1 piece of black foam  Sealed NPWT dressing at 129m HG Patient received IV  pain medication per bedside nurse prior to dressing change Patient tolerated procedure well WBraseltonnurse to change NPWT on Sunday 08/24/2022.    Discussed POC with patient and bedside nurse.   Thanks HSmur[employer]-Stone ContainerMSN, RN-BC, [employer]

## 2022-08-22 NOTE — Plan of Care (Signed)
  Problem: Education: Goal: Knowledge of disease or condition will improve Outcome: Progressing   

## 2022-08-22 NOTE — Progress Notes (Signed)
Calorie Count Note  48 hour calorie count ordered. Day 2 results below:  Diet: Dysphagia 1 with thin liquids Supplements: Magic Cup po TID with meals, Nectar Mighty Shake TID with meals, PROSource Plus 30 mL BID   Lunch 12/28: 2 bites purees carrots, 2 bites mashed potatoes, 2 bites pureed beef (~21 kcal, 1 gram of protein) Dinner 12/28: 0% Breakfast 12/29: 10% grits, 10% eggs, 75% waffles, 50% Magic Cup, 50% Nectar Mighty Shake (416 kcal, 15 grams of protein)  Total intake: 437 kcal (26% of minimum estimated needs)  16 grams of protein (16% of minimum estimated needs)  Estimated Nutritional Needs:  Kcal:  1700-1900 Protein:  100-125 grams Fluid:  >1.7 L/day  Nutrition Dx: Increased nutrient needs related to wound healing as evidenced by estimated needs.    Goal: Patient will meet greater than or equal to 90% of their needs   Intervention:  -See follow-up note 08/22/22 for updated nutrition intervention  [name], MS, RD, LDN, [place] Pager number available on Amion

## 2022-08-22 NOTE — Progress Notes (Addendum)
Physical Therapy Treatment Patient Details Name: Heather [name] MRN: [id number] DOB: Oct 11, 1950 Today's Date: 08/22/2022   History of Present Illness Ms. Tausha Milhoan is a 71 y.o. 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  Found to have mottled LE's after given Narcdipine due to hypertention and went for emergent common femoral and iliofemoral endarterectomy and bilateral 4 compartment fasciotomies on 08/04/22. 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 History of smoker(2ppd), severe AI/AS s/p TAVR, CAD s/p PCI and on DAPT, COPD, PAD, HTN    PT Comments    Pt seen for PT tx with pt asleep but awakened & agreeable. Pt endorses pain in LLE & R ankle at times throughout session. Pt demonstrates poor attention, awareness, & ability to follow simple commands. Pt requires max<>total assist for bed mobility & max assist for static sitting EOB. While sitting EOB x ~7 minutes pt demonstrates R lateral lean & pt unable to correct despite PT providing multiple various cues. Pt assisted back to bed & rolled L<>R for chuck change & peri care 2/2 incontinent BM. Attempted to instruct pt in RLE exercises but pt with poor ability to follow commands. Messaged nurse about trying to assist pt OOB with maximove at least every other day as able as pt would benefit from OOB activity in between PT sessions.  All PT goals have been reviewed & remain appropriate at this time. Date updated.    Recommendations for follow up therapy are one component of a multi-disciplinary discharge planning process, led by the attending physician.  Recommendations may be updated based on patient status, additional functional criteria and insurance authorization.  Follow Up Recommendations  Skilled nursing-short term rehab (<3 hours/day) Can patient physically be transported by private vehicle: No   Assistance Recommended at  Discharge Frequent or constant Supervision/Assistance  Patient can return home with the following Two people to help with walking and/or transfers;Two people to help with bathing/dressing/bathroom   Equipment Recommendations   (TBD in next venue)    Recommendations for Other Services       Precautions / Restrictions Precautions Precautions: Fall;Other (comment) Precaution Comments: bilateral groin wound vac Restrictions Weight Bearing Restrictions: Yes RLE Weight Bearing: Weight bearing as tolerated LLE Weight Bearing: Non weight bearing     Mobility  Bed Mobility Overal bed mobility: Needs Assistance Bed Mobility: Rolling, Supine to Sit, Sit to Supine Rolling: Max assist, Total assist   Supine to sit: Total assist, HOB elevated Sit to supine: Max assist   General bed mobility comments: Multimodal cuing for hand placement & sequencing but poor initiation & execution.    Transfers                        Ambulation/Gait                   Stairs             Wheelchair Mobility    Modified Rankin (Stroke Patients Only)       Balance Overall balance assessment: Needs assistance Sitting-balance support: Bilateral upper extremity supported, Feet supported, Feet unsupported (RLE lightly touching floor) Sitting balance-Leahy Scale: Zero   Postural control: Right lateral lean  Cognition Arousal/Alertness: Awake/alert Behavior During Therapy: Flat affect Overall Cognitive Status: Impaired/Different from baseline Area of Impairment: Attention, Memory, Following commands, Safety/judgement, Awareness, Problem solving                 Orientation Level: Situation Current Attention Level: Sustained Memory: Decreased short-term memory Following Commands: Follows one step commands with increased time, Follows one step commands inconsistently Safety/Judgement: Decreased awareness of safety,  Decreased awareness of deficits Awareness: Emergent, Intellectual Problem Solving: Requires verbal cues, Requires tactile cues, Slow processing, Decreased initiation, Difficulty sequencing General Comments: Pt with very slow processing & delayed answers, poor ability to follow simple commands despite multimodal cuing. Will respond "okay" when asked to perform exercise but won't initiate activity.        Exercises General Exercises - Lower Extremity Heel Slides: PROM, 10 reps, Right, Supine    General Comments        Pertinent Vitals/Pain Pain Assessment Pain Assessment: Faces Faces Pain Scale: Hurts whole lot Pain Location: R ankle when PT performs ankle PROM; L residual limb when rolling to L in bed Pain Descriptors / Indicators: Grimacing, Guarding, Discomfort Pain Intervention(s): Monitored during session, Repositioned, Patient requesting pain meds-RN notified    Home Living                          Prior Function            PT Goals (current goals can now be found in the care plan section) Acute Rehab PT Goals Patient Stated Goal: agreeable to rehab PT Goal Formulation: With patient Time For Goal Achievement: 09/05/22 Potential to Achieve Goals: Poor Progress towards PT goals: PT to reassess next treatment    Frequency    Min 2X/week      PT Plan Current plan remains appropriate    Co-evaluation              AM-PAC PT "6 Clicks" Mobility   Outcome Measure  Help needed turning from your back to your side while in a flat bed without using bedrails?: Total Help needed moving from lying on your back to sitting on the side of a flat bed without using bedrails?: Total Help needed moving to and from a bed to a chair (including a wheelchair)?: Total Help needed standing up from a chair using your arms (e.g., wheelchair or bedside chair)?: Total Help needed to walk in hospital room?: Total Help needed climbing 3-5 steps with a railing? : Total 6  Click Score: 6    End of Session Equipment Utilized During Treatment: Oxygen Activity Tolerance: Patient tolerated treatment well Patient left: in bed;with call bell/phone within reach;with bed alarm set (wound vacs intact (R wound vac dressing appears loose but foam is suctioned down -- nurse in room to asssess)) Nurse Communication: Patient requests pain meds PT Visit Diagnosis: Muscle weakness (generalized) (M62.81);Pain;Other abnormalities of gait and mobility (R26.89) Pain - Right/Left: Left Pain - part of body: Leg     Time: 1202-1226 PT Time Calculation (min) (ACUTE ONLY): 24 min  Charges:  $Therapeutic Activity: 23-37 mins                     Lavone Nian, PT, DPT 08/22/22, 12:51 PM   Waunita Schooner 08/22/2022, 12:49 PM

## 2022-08-22 NOTE — Progress Notes (Addendum)
  Progress Note  VASCULAR SURGERY ASSESSMENT & PLAN:   OPEN GROIN WOUNDS: Continue VAC changes 3 times a week.    POD 69 LEFT AKA: Her amputation site is healing nicely.  Continue daily dressing changes.   NUTRITION: She is getting tube feeds.   ID: Her groin wound grew moderate E. coli and rare Proteus mirabilis. She is now on IVAncef and po Flagyl.   DVT PROPHYLAXIS: She is getting subcu heparin.   Gae Gallop, MD 1:51 PM   08/22/2022 8:26 AM 8 Days Post-Op  Subjective:  very sore in both groins  Vitals:   08/22/22 0758 08/22/22 0801  BP:  (!) 133/52  Pulse:  92  Resp:  19  Temp:  99.2 F (37.3 C)  SpO2: 97% 98%   Physical Exam: Incisions:  bilateral groin vacs taken off this AM for assessment. Both groins healing appropriately, some small areas of fat necrosis in the R groin Extremities:  Brisk R DP/PT. L AKA   CBC    Component Value Date/Time   WBC 11.8 (H) 08/22/2022 0627   RBC 2.83 (L) 08/22/2022 0627   HGB 8.7 (L) 08/22/2022 0627   HGB 14.3 06/20/2012 2127   HCT 26.8 (L) 08/22/2022 0627   HCT 42.9 06/20/2012 2127   PLT 661 (H) 08/22/2022 0627   PLT 357 06/20/2012 2127   MCV 94.7 08/22/2022 0627   MCV 91 06/20/2012 2127   MCH 30.7 08/22/2022 0627   MCHC 32.5 08/22/2022 0627   RDW 15.0 08/22/2022 0627   RDW 13.6 06/20/2012 2127   LYMPHSABS 1.0 08/22/2022 0627   MONOABS 1.1 (H) 08/22/2022 0627   EOSABS 0.2 08/22/2022 0627   BASOSABS 0.1 08/22/2022 0627    BMET    Component Value Date/Time   NA 137 08/21/2022 0530   NA 135 (L) 06/20/2012 2127   K 4.7 08/21/2022 0530   K 4.0 06/20/2012 2127   CL 101 08/21/2022 0530   CL 100 06/20/2012 2127   CO2 30 08/21/2022 0530   CO2 27 06/20/2012 2127   GLUCOSE 250 (H) 08/21/2022 0530   GLUCOSE 250 (H) 06/20/2012 2127   BUN 31 (H) 08/21/2022 0530   BUN 20 (H) 06/20/2012 2127   CREATININE 0.71 08/21/2022 0530   CREATININE 0.60 06/20/2012 2127   CALCIUM 8.6 (L) 08/21/2022 0530   CALCIUM 8.8  06/20/2012 2127   GFRNONAA >60 08/21/2022 0530   GFRNONAA >60 06/20/2012 2127   GFRAA >60 06/20/2012 2127    INR    Component Value Date/Time   INR 1.0 08/03/2022 2156     Intake/Output Summary (Last 24 hours) at 08/22/2022 0826 Last data filed at 08/22/2022 1017 Gross per 24 hour  Intake 886.07 ml  Output 2350 ml  Net -1463.93 ml   Assessment/Plan:  71 y.o. female is 8 days post op,s/p: bilateral iliofemoral endarterectomies, lower extremity embolectomies with fasciotomies, L AKA and bilateral groin washout with wound vac placement     -L AKA intact and dry. RLE intact with staples -Bilateral groins healing appropriately with some small areas of fat necrosis in the R groin. WOC will place bilateral groin vacs back on this AM with white and black foam  Vicente Serene, PA-C [facility] [phone] 08/22/2022 8:26 AM

## 2022-08-22 NOTE — Progress Notes (Signed)
Nutrition Follow-up  DOCUMENTATION CODES:   Not applicable  INTERVENTION:  Will discontinue calorie count order as 48 hour calorie count now complete.  Provide 1:1 assistance with setting up trays and at meals.  Continue current tube feed regimen via Cortrak tube: -Pivot 1.5 at 60 mL/hour x 16 hours from 1600-0800 (960 mL) -Provides: 1440 kcal, 90 grams of protein, 720 mL H2O daily -Meets 85% minimum estimated kcal needs and 90% minimum estimated protein needs  Provide minimum free water flush of 30 mL every 4 hours while tube feeds infusing to maintain patency.  Continue multivitamin with minerals daily per tube. This can be changed to oral once pt has improved oral intake.  Will discontinue Nectar Mighty Shake from trays as pt reports she does not like these supplements.  Provide Ensure Enlive po TID, each supplement provides 350 kcal and 20 grams of protein.  Continue PROSource Plus po BID, each supplement provides 100 kcal and 15 grams of protein.  Continue Magic cup TID with meals, each supplement provides 290 kcal and 9 grams of protein.  NUTRITION DIAGNOSIS:   Increased nutrient needs related to wound healing as evidenced by estimated needs.  Ongoing.  GOAL:   Patient will meet greater than or equal to 90% of their needs  Met with tube feeds.  MONITOR:   PO intake, Supplement acceptance, Diet advancement, Labs, Weight trends, TF tolerance, Skin, I & O's  REASON FOR ASSESSMENT:   Consult Enteral/tube feeding initiation and management  ASSESSMENT:   Pt with PMH of CHF, anxiety, PAD, bipolar, COPD, CAD, depression, DM with neuropathy, GERD, HTN, HLD, schizophrenia admitted with acute ICH and critical limb ischemia.  12/11: s/p b/l common femoral endarterectomy, iliofemoral endarterectomy, lower extremity embolectomy and stent of b/l common iliac arteries and Rt external iliac artery, and b/l 4 compartment fasciotomies  S/p cortrak placement; per xray tip in  distal stomach or proximal duodenum  12/14: extubated 12/16: transferred from 4N to 4E 12/18: underwent FEES; advanced to dysphagia 2 diet with thin liquids per SLP, however there is a comment on the diet order for milk precautions: no milk to drink, no ice cream, milkshakes, milk-like supplements as pt had penetration with thin milk on study completed 12/19: plan per team was to discontinue tube feeds, but after discussing with MD regarding poor PO intake, changed to nocturnal tube feeds instead 12/21: s/p left AKA and shart excisional debridement of bilateral surgical groin wounds and wound VAC placement 12/22: seen by SLP and diet advanced to dysphagia 3 with thin liquids; per comment section of this diet order no milk on tray but other dairy okay 12/27: diet downgraded to dysphagia 1 with thin liquids by MD per request of patient's family 12/29: SLP removed milk restrictions on tray, so now able to order Ensure supplements  Completed 48 hour calorie count. On day 1 pt met 7% minimum kcal needs and 16% minimum protein needs. On day 2 pt met 26% minimum kcal needs and 16% minimum protein needs. Meeting 100% needs between oral intake in the past 24 hours and tube feed regimen, would not recommend weaning tube feeds any further at this point until oral intake improves.  Met with patient at bedside. She was very sleepy at time of RD assessment. NT was able to help feed patient breakfast and she ate better than she had previously been eating when feeding herself. Recommend continuing assistance with meals. Discussed with SLP and milk restrictions are being removed to see if that can  help increase oral intake. Will discontinue Nectar Mighty Shake per pt request and instead order Ensure supplements.  Pt was documented to be 63.5 kg on 08/08/22. Current wt is 69.3 kg. Suspect may be falsely elevated from fluid status.  Enteral Access: 10 Fr. Cortrak tube placed 12/11; 58 cm in left nare secured with  bridle; tip terminates in distal stomach or proximal duodenum per abdominal x-ray 12/11  Tube Feed Regimen: Pivot 1.5 at 60 mL/hour x 16 hours from 1600-0800 (960 mL)  UOP: 1600 mL (1 mL/kg/hr)  I/O: +903.9 mL  Medications reviewed and include: PROSource Plus 30 mL BID, Novolog 0-15 units Q4hrs, Novolog 5 units Q4hrs, Semglee 25 units daily, Flagyl, Remeron, MVI daily, nicotine patch, pantoprazole, cefazolin  Labs reviewed: CBG 191-308, BUN 29  Diet Order:   Diet Order             DIET - DYS 1 Room service appropriate? No; Fluid consistency: Thin  Diet effective now                  EDUCATION NEEDS:   Education needs have been addressed (Encouraged adequate intake of calories and protein and discussed importance of adequate intake.)  Skin:  Skin Assessment: Skin Integrity Issues: Skin Integrity Issues:: Stage II, Incisions, Wound VAC, Other (Comment) Stage II: sacrum Wound Vac: bilateral groin surgical wounds Incisions: closed incision to leg Other: surgical wounds bilateral groin with wound VAC  Last BM:  08/21/22 - type 7  Height:   Ht Readings from Last 1 Encounters:  08/04/22 5' (1.524 m)   Weight:   Wt Readings from Last 1 Encounters:  08/20/22 69.3 kg   BMI:  Body mass index is 29.84 kg/m.  Estimated Nutritional Needs:   Kcal:  1700-1900  Protein:  100-125 grams  Fluid:  >1.7 L/day  [name], MS, RD, LDN, CNSC Pager number available on Amion

## 2022-08-22 NOTE — Progress Notes (Signed)
PROGRESS NOTE  Heather [name]  DOB: [birth year]  PCP: [name], [place] HWE:[id number]  DOA: 08/04/2022  LOS: 33 days  Hospital Day: 19  Brief narrative: Heather [name] is a 71 y.o. female with PMH significant for DM2, neuropathy, HTN, HLD, COPD, GERD, PAD, AS s/p TAVR, chronic diastolic CHF, anxiety/depression/schizophrenia, tobacco abuse. 12/11, patient presented to ED with sudden onset of dizziness, blurred vision, headache, nausea and vomiting.  Blood pressure was significantly elevated to over 893 systolic with EMS. She was admitted to ICU for hemorrhagic stroke and bilateral lower extremity acute on chronic ischemia.   Vascular surgery was consulted and s/p bilateral lower extremity femoral endarterectomy and patch angioplasty with iliac stenting, 4 compartment fasciotomies for acute on chronic lower extremity ischemia.  Postop remained on vent and Cleviprex.   Transferred to floor and TRH on 12/17.   Neurology and vascular surgery following.    Subjective: Patient was seen and examined this morning.  Underwent wound VAC change this morning.  She had just gotten pain medicine prior to my evaluation and hence she was somewhat.  No family at bedside. No family at bedside. Per nursing staff, diarrhea improving. Poor appetite.  Dietitian and speech therapy follow-up appreciated.  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  CK level was elevated to thousands.  Gradually downtrended. However LLE ischemia did not improve, patient started having rhabdomyolysis. Patient ultimately required left AKA on 12/24.  Wound culture grew E. coli and Proteus Mirabilis. Currently on IV Ancef and IV Flagyl. Continue wound VAC changes 3 times a week per  vascular surgery. Previously on heparin drip.  Currently on Plavix 75 Mg daily.    Acute intracranial hemorrhage  Initial CT scan showed acute intraparenchymal hematoma within the cerebellar vermis measuring 11 cc in volume and mild mass effect upon the fourth ventricle. Confirmed with an MRI. Neurology was consulted. Eventually patient was started on antiplatelet agents.  Currently on Plavix 75 mg daily.    Acute respiratory failure with hypoxia COPD, everyday smoker Extubated 12/14. Currently on low-flow oxygen. Continue Brovana twice daily, Pulmicort twice daily, Yupelri daily, albuterol PRN  Uncontrolled type 2 diabetes mellitus A1c 12.9 on 08/05/2022 Currently on Semglee 22 units daily, sliding scale insulin.  Blood sugar level running elevated consistently.  Increased Semglee to 25 units daily for this morning.  Continue to monitor Recent Labs  Lab 08/21/22 1953 08/21/22 2311 08/22/22 0308 08/22/22 0813 08/22/22 1148  GLUCAP 181* 203* 191* 308* 247*   Chronic diastolic CHF HTN  Received intermittent dialysis while in ICU.   Currently blood pressure controlled on Coreg 12.5 mg BID.  Hydralazine as needed. Echo 12/11 with EF 60 to 65%, no WMA, indeterminate diastolic parameters.   Hx of CAD, HLD AS s/p TAVR. On Plavix 75 mg daily, Lipitor 80 mg daily.  Acute anemia Received 3 units of PRBCs this hospitalization.  Hemoglobin running stable over 8 lately. Recent Labs    08/18/22 0513 08/19/22 0534 08/20/22 0645 08/21/22 0530 08/22/22 0627  HGB 8.3* 8.0* 8.1* 8.7* 8.7*  MCV 97.1 96.3 96.7 95.8 94.7     Hx of depression, bipolar, schizophrenia, insomnia. Currently on Lexapro daily, Remeron nightly, trazodone nightly.,    Dysphagia Likely related to hemorrhagic stroke Has left nasal core track with ongoing tube feeds. Speech therapy following.  Dysphagia diet currently. May need PEG tube eventually.  Diarrhea 12/27, patient had significant loose bowel movement.   Noticed that she was not scheduled bowel regimen.  I stopped it.  Goals of care   Code Status: Full Code    Mobility: Encourage ambulation  Scheduled Meds:  (feeding supplement) PROSource Plus  30 mL Oral BID BM   arformoterol  15 mcg Nebulization BID   atorvastatin  80 mg Per Tube Daily   budesonide (PULMICORT) nebulizer solution  0.5 mg Nebulization BID   carvedilol  12.5 mg Per Tube BID WC   Chlorhexidine Gluconate Cloth  6 each Topical Q0600   clopidogrel  75 mg Oral Daily   escitalopram  10 mg Per Tube Daily   feeding supplement  237 mL Oral TID WC   feeding supplement (PIVOT 1.5 CAL)  1,000 mL Per Tube Q24H   heparin injection (subcutaneous)  5,000 Units Subcutaneous Q8H   insulin aspart  0-15 Units Subcutaneous Q4H   insulin aspart  5 Units Subcutaneous Q4H   insulin glargine-yfgn  25 Units Subcutaneous Daily   ipratropium-albuterol  3 mL Nebulization BID   metroNIDAZOLE  500 mg Per Tube Q12H   mirtazapine  7.5 mg Per Tube QHS   multivitamin with minerals  1 tablet Per Tube Daily   nicotine  14 mg Transdermal Daily   mouth rinse  15 mL Mouth Rinse Q4H   oxyCODONE  10 mg Oral Q12H   pantoprazole  40 mg Oral Daily   revefenacin  175 mcg Nebulization Daily   sodium chloride flush  10-40 mL Intracatheter Q12H    PRN meds: acetaminophen **OR** acetaminophen (TYLENOL) oral liquid 160 mg/5 mL **OR** acetaminophen, albuterol, hydrALAZINE, HYDROmorphone (DILAUDID) injection, ondansetron (ZOFRAN) IV, mouth rinse, oxyCODONE, sodium chloride flush, traZODone   Infusions:    ceFAZolin (ANCEF) IV 2 g (08/22/22 0544)    Skin assessment:  Pressure Injury 08/04/22 Sacrum Stage 2 -  Partial thickness loss of dermis presenting as a shallow open injury with a red, pink wound bed without slough. (Active)  08/04/22 0100  Location: Sacrum  Location Orientation:   Staging: Stage 2 -  Partial thickness loss of dermis presenting as a shallow open injury with a red, pink wound bed without  slough.  Wound Description (Comments):   Present on Admission: Yes    Nutritional status:  Body mass index is 29.84 kg/m.  Nutrition Problem: Increased nutrient needs Etiology: wound healing Signs/Symptoms: estimated needs     Diet:  Diet Order             DIET - DYS 1 Room service appropriate? No; Fluid consistency: Thin  Diet effective now                   DVT prophylaxis:  heparin injection 5,000 Units Start: 08/10/22 1600   Antimicrobials: Ancef, Flagyl Fluid: None Consultants: Vascular surgery Family Communication: Family not at bedside today.  I called and discussed with patient's daughter Ms. [name] and ask about PEG tube placement.  She is going to discuss with other family members before deciding  Status is: Inpatient  Continue in-hospital care because: Pending clinical improvement Level of care: Telemetry Medical   Dispo: The patient is from: Home              Anticipated d/c is to: SNF              Patient currently is not medically stable to d/c.   Difficult to place  patient No    Antimicrobials: Anti-infectives (From admission, onward)    Start     Dose/Rate Route Frequency Ordered Stop   08/20/22 2200  metroNIDAZOLE (FLAGYL) IVPB 500 mg  Status:  Discontinued        500 mg 100 mL/hr over 60 Minutes Intravenous Every 12 hours 08/20/22 1110 08/20/22 1111   08/20/22 2200  metroNIDAZOLE (FLAGYL) tablet 500 mg        500 mg Per Tube Every 12 hours 08/20/22 1111     08/18/22 2300  ceFAZolin (ANCEF) IVPB 2g/100 mL premix        2 g 200 mL/hr over 30 Minutes Intravenous Every 8 hours 08/18/22 1455     08/18/22 2300  metroNIDAZOLE (FLAGYL) IVPB 500 mg  Status:  Discontinued        500 mg 100 mL/hr over 60 Minutes Intravenous Every 6 hours 08/18/22 1455 08/20/22 1110   08/15/22 1100  vancomycin (VANCOCIN) IVPB 1000 mg/200 mL premix  Status:  Discontinued        1,000 mg 200 mL/hr over 60 Minutes Intravenous Every 24 hours 08/14/22 1006 08/15/22  1307   08/14/22 1400  piperacillin-tazobactam (ZOSYN) IVPB 3.375 g  Status:  Discontinued        3.375 g 12.5 mL/hr over 240 Minutes Intravenous Every 8 hours 08/14/22 1006 08/18/22 1455   08/14/22 1100  vancomycin (VANCOREADY) IVPB 1500 mg/300 mL        1,500 mg 150 mL/hr over 120 Minutes Intravenous  Once 08/14/22 1006 08/14/22 1502   08/04/22 1045  ceFAZolin (ANCEF) IVPB 2g/100 mL premix        2 g 200 mL/hr over 30 Minutes Intravenous On call to O.R. 08/04/22 0958 08/04/22 1100       Objective: Vitals:   08/22/22 0801 08/22/22 1106  BP: (!) 133/52 (!) 107/44  Pulse: 92 94  Resp: 19 19  Temp: 99.2 F (37.3 C) 97.8 F (36.6 C)  SpO2: 98% 94%    Intake/Output Summary (Last 24 hours) at 08/22/2022 1528 Last data filed at 08/22/2022 1054 Gross per 24 hour  Intake 1006.07 ml  Output 2800 ml  Net -1793.93 ml   Filed Weights   08/17/22 0500 08/19/22 0420 08/20/22 0500  Weight: 71.6 kg 66.2 kg 69.3 kg   Weight change:  Body mass index is 29.84 kg/m.   Physical Exam: General exam: Pleasant, elderly Caucasian female.  Not in pain Skin: No rashes, lesions or ulcers. HEENT: Atraumatic, normocephalic, no obvious bleeding. Lungs: Clear to auscultation bilaterally CVS: Regular rate and rhythm, no murmur GI/Abd soft, nondistended, nontender, bowel sound present CNS: Somnolent because of the effect of pain medicine. Psychiatry: Sad affect Extremities: No pedal edema, no calf tenderness.  Left AKA stump clean.  Wound VAC in the groin  Data Review: I have personally reviewed the laboratory data and studies available.  F/u labs ordered Unresulted Labs (From admission, onward)     Start     Ordered   08/23/22 0500  CBC with Differential/Platelet  Tomorrow morning,   R       Question:  Specimen collection method  Answer:  Unit=Unit collect   08/22/22 0801   08/23/22 9528  Basic metabolic panel  Tomorrow morning,   R       Question:  Specimen collection method  Answer:   Unit=Unit collect   08/22/22 0801   08/18/22 0500  CBC with Differential/Platelet  Daily,   R     Question:  Specimen collection  method  Answer:  Lab=Lab collect   08/17/22 1947            Total time spent in review of labs and imaging, patient evaluation, formulation of plan, documentation and communication with family 83 minutes  Signed, Terrilee Croak, MD [facility] 08/22/2022

## 2022-08-22 NOTE — Progress Notes (Signed)
SLP Note  Patient Details Name: Heather [name] MRN: [id number] DOB: [birth year]   Spoke with RD via secure chat.  Pt continues to have extremely poor PO intake and milk precautions are limiting nutritional supplements which can be offered.  Given severity of risk of malnutrition as compared to mild risk of aspiration with thin liquid dairy, will remove milk precautions to allow thin liquid dairy.  Recommend slow rate with small single sips of thin liquid dairy with intermittent throat clear.    Celedonio Savage, [place], [place] Office: [phone] 08/22/2022, 10:09 AM

## 2022-08-23 DIAGNOSIS — K612 Anorectal abscess: Secondary | ICD-10-CM | POA: Diagnosis not present

## 2022-08-23 LAB — CBC WITH DIFFERENTIAL/PLATELET
Abs Immature Granulocytes: 0.11 10*3/uL — ABNORMAL HIGH (ref 0.00–0.07)
Basophils Absolute: 0.1 10*3/uL (ref 0.0–0.1)
Basophils Relative: 1 %
Eosinophils Absolute: 0.2 10*3/uL (ref 0.0–0.5)
Eosinophils Relative: 2 %
HCT: 27.2 % — ABNORMAL LOW (ref 36.0–46.0)
Hemoglobin: 8.6 g/dL — ABNORMAL LOW (ref 12.0–15.0)
Immature Granulocytes: 1 %
Lymphocytes Relative: 10 %
Lymphs Abs: 1.1 10*3/uL (ref 0.7–4.0)
MCH: 30.1 pg (ref 26.0–34.0)
MCHC: 31.6 g/dL (ref 30.0–36.0)
MCV: 95.1 fL (ref 80.0–100.0)
Monocytes Absolute: 1.1 10*3/uL — ABNORMAL HIGH (ref 0.1–1.0)
Monocytes Relative: 11 %
Neutro Abs: 8.4 10*3/uL — ABNORMAL HIGH (ref 1.7–7.7)
Neutrophils Relative %: 75 %
Platelets: 641 10*3/uL — ABNORMAL HIGH (ref 150–400)
RBC: 2.86 MIL/uL — ABNORMAL LOW (ref 3.87–5.11)
RDW: 15.2 % (ref 11.5–15.5)
WBC: 10.9 10*3/uL — ABNORMAL HIGH (ref 4.0–10.5)
nRBC: 0 % (ref 0.0–0.2)

## 2022-08-23 LAB — BASIC METABOLIC PANEL
Anion gap: 10 (ref 5–15)
BUN: 32 mg/dL — ABNORMAL HIGH (ref 8–23)
CO2: 31 mmol/L (ref 22–32)
Calcium: 8.8 mg/dL — ABNORMAL LOW (ref 8.9–10.3)
Chloride: 97 mmol/L — ABNORMAL LOW (ref 98–111)
Creatinine, Ser: 0.74 mg/dL (ref 0.44–1.00)
GFR, Estimated: >60 mL/min (ref >60)
Glucose, Bld: 258 mg/dL — ABNORMAL HIGH (ref 70–99)
Potassium: 4.4 mmol/L (ref 3.5–5.1)
Sodium: 138 mmol/L (ref 135–145)

## 2022-08-23 LAB — GLUCOSE, CAPILLARY
Glucose-Capillary: 133 mg/dL — ABNORMAL HIGH (ref 70–99)
Glucose-Capillary: 169 mg/dL — ABNORMAL HIGH (ref 70–99)
Glucose-Capillary: 208 mg/dL — ABNORMAL HIGH (ref 70–99)
Glucose-Capillary: 226 mg/dL — ABNORMAL HIGH (ref 70–99)
Glucose-Capillary: 270 mg/dL — ABNORMAL HIGH (ref 70–99)
Glucose-Capillary: 318 mg/dL — ABNORMAL HIGH (ref 70–99)

## 2022-08-23 MED ORDER — PROSOURCE TF20 ENFIT COMPATIBL EN LIQD
60.0000 mL | Freq: Every day | ENTERAL | Status: DC
  Administered 2022-08-23 – 2022-09-03 (×6): 60 mL
  Filled 2022-08-23 (×8): qty 60

## 2022-08-23 MED ORDER — INSULIN ASPART 100 UNIT/ML IJ SOLN
6.0000 [IU] | INTRAMUSCULAR | Status: DC
  Administered 2022-08-23 – 2022-08-24 (×5): 6 [IU] via SUBCUTANEOUS

## 2022-08-23 MED ORDER — INSULIN GLARGINE-YFGN 100 UNIT/ML ~~LOC~~ SOLN
30.0000 [IU] | Freq: Every day | SUBCUTANEOUS | Status: DC
  Administered 2022-08-23 – 2022-08-29 (×7): 30 [IU] via SUBCUTANEOUS
  Filled 2022-08-23 (×8): qty 0.3

## 2022-08-23 NOTE — Progress Notes (Signed)
PROGRESS NOTE  Heather [name]  DOB: [birth year]  PCP: [name], [place] YPP:[id number]  DOA: 08/04/2022  LOS: 65 days  Hospital Day: 20  Brief narrative: Heather [name] is a 71 y.o. female with PMH significant for DM2, neuropathy, HTN, HLD, COPD, GERD, PAD, AS s/p TAVR, chronic diastolic CHF, anxiety/depression/schizophrenia, tobacco abuse. 12/11, patient presented to ED with sudden onset of dizziness, blurred vision, headache, nausea and vomiting.  Blood pressure was significantly elevated to over 458 systolic with EMS. She was admitted to ICU for hemorrhagic stroke and bilateral lower extremity acute on chronic ischemia.   Vascular surgery was consulted and s/p bilateral lower extremity femoral endarterectomy and patch angioplasty with iliac stenting, 4 compartment fasciotomies for acute on chronic lower extremity ischemia.  Postop remained on vent and Cleviprex.   Transferred to floor and TRH on 12/17.   Neurology and vascular surgery following.    Subjective: Patient was seen and examined this morning.  Propped up in bed.  Not in distress.  No family at bedside.  Poor appetite per nursing staff.  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  CK level was elevated to thousands.  Gradually downtrended. However LLE ischemia did not improve, patient started having rhabdomyolysis. Patient ultimately required left AKA on 12/24.  Wound culture grew E. coli and Proteus Mirabilis. Currently on IV Ancef and IV Flagyl. Continue wound VAC changes 3 times a week per vascular surgery. Previously on heparin drip.  Currently on Plavix 75 Mg daily.    Acute intracranial hemorrhage  Initial CT scan showed acute intraparenchymal hematoma within the cerebellar  vermis measuring 11 cc in volume and mild mass effect upon the fourth ventricle. Confirmed with an MRI. Neurology was consulted. Eventually patient was started on antiplatelet agents.  Currently on Plavix 75 mg daily.    Acute respiratory failure with hypoxia COPD, everyday smoker Extubated 12/14. Currently on low-flow oxygen. Continue Brovana twice daily, Pulmicort twice daily, Yupelri daily, albuterol PRN  Uncontrolled type 2 diabetes mellitus A1c 12.9 on 08/05/2022 Blood sugar level running elevated as below. Currently on Semglee 25 units daily, sliding scale insulin.  This morning, increase Semglee to 30 units.  Also increased Premeal insulin to 6 units 3 times daily. Recent Labs  Lab 08/22/22 2025 08/22/22 2335 08/23/22 0401 08/23/22 0820 08/23/22 1127  GLUCAP 202* 255* 318* 270* 208*   Chronic diastolic CHF HTN  Received intermittent dialysis while in ICU.   Currently blood pressure controlled on Coreg 12.5 mg BID.  Hydralazine as needed. Echo 12/11 with EF 60 to 65%, no WMA, indeterminate diastolic parameters.   Hx of CAD, HLD AS s/p TAVR. On Plavix 75 mg daily, Lipitor 80 mg daily.  Acute anemia Received 3 units of PRBCs this hospitalization.  Hemoglobin running stable over 8 lately. Recent Labs    08/19/22 0534 08/20/22 0645 08/21/22 0530 08/22/22 0627 08/23/22 0700  HGB 8.0* 8.1* 8.7* 8.7* 8.6*  MCV 96.3 96.7 95.8 94.7 95.1     Hx of depression, bipolar, schizophrenia, insomnia. Currently on Lexapro daily, Remeron nightly, trazodone nightly.,    Dysphagia Likely related to hemorrhagic stroke Has left nasal core track with ongoing tube feeds. Speech therapy following.  Dysphagia diet currently. May need PEG tube eventually.  I discussed this option with patient's daughters on 12/29.  They will  discuss in the family and reach out to [place]  Diarrhea 12/27, patient had significant loose bowel movement.  Noticed that she was not scheduled bowel regimen.  I  stopped it.  Goals of care   Code Status: Full Code    Mobility: Encourage ambulation  Scheduled Meds:  arformoterol  15 mcg Nebulization BID   atorvastatin  80 mg Per Tube Daily   budesonide (PULMICORT) nebulizer solution  0.5 mg Nebulization BID   carvedilol  12.5 mg Per Tube BID WC   Chlorhexidine Gluconate Cloth  6 each Topical Q0600   clopidogrel  75 mg Oral Daily   escitalopram  10 mg Per Tube Daily   feeding supplement  237 mL Oral TID WC   feeding supplement (PIVOT 1.5 CAL)  1,000 mL Per Tube Q24H   feeding supplement (PROSource TF20)  60 mL Per Tube Daily   heparin injection (subcutaneous)  5,000 Units Subcutaneous Q8H   insulin aspart  0-15 Units Subcutaneous Q4H   insulin aspart  6 Units Subcutaneous Q4H   insulin glargine-yfgn  30 Units Subcutaneous Daily   metroNIDAZOLE  500 mg Per Tube Q12H   mirtazapine  7.5 mg Per Tube QHS   multivitamin with minerals  1 tablet Per Tube Daily   nicotine  14 mg Transdermal Daily   mouth rinse  15 mL Mouth Rinse Q4H   oxyCODONE  10 mg Oral Q12H   pantoprazole  40 mg Oral Daily   revefenacin  175 mcg Nebulization Daily   sodium chloride flush  10-40 mL Intracatheter Q12H    PRN meds: acetaminophen **OR** acetaminophen (TYLENOL) oral liquid 160 mg/5 mL **OR** acetaminophen, albuterol, hydrALAZINE, HYDROmorphone (DILAUDID) injection, ondansetron (ZOFRAN) IV, mouth rinse, oxyCODONE, sodium chloride flush, traZODone   Infusions:    ceFAZolin (ANCEF) IV 2 g (08/23/22 1311)    Skin assessment:  Pressure Injury 08/04/22 Sacrum Stage 2 -  Partial thickness loss of dermis presenting as a shallow open injury with a red, pink wound bed without slough. (Active)  08/04/22 0100  Location: Sacrum  Location Orientation:   Staging: Stage 2 -  Partial thickness loss of dermis presenting as a shallow open injury with a red, pink wound bed without slough.  Wound Description (Comments):   Present on Admission: Yes    Nutritional status:   Body mass index is 29.84 kg/m.  Nutrition Problem: Increased nutrient needs Etiology: wound healing Signs/Symptoms: estimated needs     Diet:  Diet Order             DIET - DYS 1 Room service appropriate? No; Fluid consistency: Thin  Diet effective now                   DVT prophylaxis:  heparin injection 5,000 Units Start: 08/10/22 1600   Antimicrobials: Ancef, Flagyl Fluid: None Consultants: Vascular surgery Family Communication: 12/29, I called and discussed with patient's daughter Ms. [name] and ask about PEG tube placement.  She is going to discuss with other family members before deciding  Status is: Inpatient  Continue in-hospital care because: Pending clinical improvement Level of care: Telemetry Medical   Dispo: The patient is from: Home              Anticipated d/c is to: SNF              Patient currently is not medically stable to d/c.   Difficult to place patient No    Antimicrobials: Anti-infectives (From admission, onward)  Start     Dose/Rate Route Frequency Ordered Stop   08/20/22 2200  metroNIDAZOLE (FLAGYL) IVPB 500 mg  Status:  Discontinued        500 mg 100 mL/hr over 60 Minutes Intravenous Every 12 hours 08/20/22 1110 08/20/22 1111   08/20/22 2200  metroNIDAZOLE (FLAGYL) tablet 500 mg        500 mg Per Tube Every 12 hours 08/20/22 1111     08/18/22 2300  ceFAZolin (ANCEF) IVPB 2g/100 mL premix        2 g 200 mL/hr over 30 Minutes Intravenous Every 8 hours 08/18/22 1455     08/18/22 2300  metroNIDAZOLE (FLAGYL) IVPB 500 mg  Status:  Discontinued        500 mg 100 mL/hr over 60 Minutes Intravenous Every 6 hours 08/18/22 1455 08/20/22 1110   08/15/22 1100  vancomycin (VANCOCIN) IVPB 1000 mg/200 mL premix  Status:  Discontinued        1,000 mg 200 mL/hr over 60 Minutes Intravenous Every 24 hours 08/14/22 1006 08/15/22 1307   08/14/22 1400  piperacillin-tazobactam (ZOSYN) IVPB 3.375 g  Status:  Discontinued        3.375 g 12.5 mL/hr  over 240 Minutes Intravenous Every 8 hours 08/14/22 1006 08/18/22 1455   08/14/22 1100  vancomycin (VANCOREADY) IVPB 1500 mg/300 mL        1,500 mg 150 mL/hr over 120 Minutes Intravenous  Once 08/14/22 1006 08/14/22 1502   08/04/22 1045  ceFAZolin (ANCEF) IVPB 2g/100 mL premix        2 g 200 mL/hr over 30 Minutes Intravenous On call to O.R. 08/04/22 0958 08/04/22 1100       Objective: Vitals:   08/23/22 0826 08/23/22 1109  BP:  (!) 123/40  Pulse: 92 83  Resp: 20 19  Temp:  98.9 F (37.2 C)  SpO2: 96% 97%    Intake/Output Summary (Last 24 hours) at 08/23/2022 1457 Last data filed at 08/23/2022 1005 Gross per 24 hour  Intake 540 ml  Output 2350 ml  Net -1810 ml   Filed Weights   08/17/22 0500 08/19/22 0420 08/20/22 0500  Weight: 71.6 kg 66.2 kg 69.3 kg   Weight change:  Body mass index is 29.84 kg/m.   Physical Exam: General exam: Pleasant, elderly Caucasian female.  Not in pain Skin: No rashes, lesions or ulcers. HEENT: Atraumatic, normocephalic, no obvious bleeding. Lungs: Clear to auscultation bilaterally CVS: Regular rate and rhythm, no murmur GI/Abd soft, nondistended, nontender, bowel sound present CNS: Somnolent because of the effect of pain medicine. Psychiatry: Sad affect Extremities: No pedal edema, no calf tenderness.  Left AKA stump clean.  Wound VAC in the groin  Data Review: I have personally reviewed the laboratory data and studies available.  F/u labs ordered Unresulted Labs (From admission, onward)     Start     Ordered   08/23/22 0500  CBC with Differential/Platelet  Tomorrow morning,   R       Question:  Specimen collection method  Answer:  Unit=Unit collect   08/22/22 0801   08/18/22 0500  CBC with Differential/Platelet  Daily,   R     Question:  Specimen collection method  Answer:  Lab=Lab collect   08/17/22 1947            Total time spent in review of labs and imaging, patient evaluation, formulation of plan, documentation and  communication with family 55 minutes  Signed, Terrilee Croak, MD [facility] 08/23/2022

## 2022-08-23 NOTE — Progress Notes (Addendum)
  Progress Note    08/23/2022 8:42 AM 9 Days Post-Op  Subjective:  complaining of sharp pains in both legs this morning. Says it is more intense then it has been   Vitals:   08/23/22 0816 08/23/22 0826  BP: (!) 140/43   Pulse: 91 92  Resp: 15 20  Temp: 98.7 F (37.1 C)   SpO2: 96% 96%   Physical Exam: Cardiac:  regular Lungs:  non labored Incisions:  B groins with wound VAC to suction, good seal. Right leg incision with staples intact. Left AKA well appearing Extremities:  Right leg well perfused with Doppler DP and PT signals Abdomen:  obese, soft Neurologic: alert and oriented  CBC    Component Value Date/Time   WBC 10.9 (H) 08/23/2022 0700   RBC 2.86 (L) 08/23/2022 0700   HGB 8.6 (L) 08/23/2022 0700   HGB 14.3 06/20/2012 2127   HCT 27.2 (L) 08/23/2022 0700   HCT 42.9 06/20/2012 2127   PLT 641 (H) 08/23/2022 0700   PLT 357 06/20/2012 2127   MCV 95.1 08/23/2022 0700   MCV 91 06/20/2012 2127   MCH 30.1 08/23/2022 0700   MCHC 31.6 08/23/2022 0700   RDW 15.2 08/23/2022 0700   RDW 13.6 06/20/2012 2127   LYMPHSABS 1.1 08/23/2022 0700   MONOABS 1.1 (H) 08/23/2022 0700   EOSABS 0.2 08/23/2022 0700   BASOSABS 0.1 08/23/2022 0700    BMET    Component Value Date/Time   NA 138 08/23/2022 0700   NA 135 (L) 06/20/2012 2127   K 4.4 08/23/2022 0700   K 4.0 06/20/2012 2127   CL 97 (L) 08/23/2022 0700   CL 100 06/20/2012 2127   CO2 31 08/23/2022 0700   CO2 27 06/20/2012 2127   GLUCOSE 258 (H) 08/23/2022 0700   GLUCOSE 250 (H) 06/20/2012 2127   BUN 32 (H) 08/23/2022 0700   BUN 20 (H) 06/20/2012 2127   CREATININE 0.74 08/23/2022 0700   CREATININE 0.60 06/20/2012 2127   CALCIUM 8.8 (L) 08/23/2022 0700   CALCIUM 8.8 06/20/2012 2127   GFRNONAA >60 08/23/2022 0700   GFRNONAA >60 06/20/2012 2127   GFRAA >60 06/20/2012 2127    INR    Component Value Date/Time   INR 1.0 08/03/2022 2156     Intake/Output Summary (Last 24 hours) at 08/23/2022 5329 Last data  filed at 08/23/2022 9242 Gross per 24 hour  Intake 520 ml  Output 2000 ml  Net -1480 ml     Assessment/Plan:  71 y.o. female is s/p bilateral iliofemoral endarterectomies, lower extremity embolectomies with fasciotomies, L AKA and bilateral groin washout with wound vac placement      9 Days Post-Op   Left AKA intact and well appearing Right leg incision is intact and healing well Bilateral groin incisions with VAC to suction. VACs will be changed by [facility] RN tomorrow 08/24/22 Tube feed for nutritional support On Ancef and Flagyl for e. Coli and proteus mirabilis Continue Plavix and Aspirin  DVT prophylaxis:  sq heparin    Karoline Caldwell, PA-C [facility] [phone] 08/23/2022 8:42 AM  VASCULAR STAFF ADDENDUM: I have independently interviewed and examined the patient. I agree with the above.   Yevonne Aline. Stanford Breed, MD [facility] Phone Number: [phone] 08/23/2022 10:39 AM

## 2022-08-24 DIAGNOSIS — I614 Nontraumatic intracerebral hemorrhage in cerebellum: Secondary | ICD-10-CM | POA: Diagnosis not present

## 2022-08-24 LAB — CBC WITH DIFFERENTIAL/PLATELET
Abs Immature Granulocytes: 0.09 10*3/uL — ABNORMAL HIGH (ref 0.00–0.07)
Basophils Absolute: 0 10*3/uL (ref 0.0–0.1)
Basophils Relative: 0 %
Eosinophils Absolute: 0.2 10*3/uL (ref 0.0–0.5)
Eosinophils Relative: 2 %
HCT: 25.4 % — ABNORMAL LOW (ref 36.0–46.0)
Hemoglobin: 8.3 g/dL — ABNORMAL LOW (ref 12.0–15.0)
Immature Granulocytes: 1 %
Lymphocytes Relative: 12 %
Lymphs Abs: 1 10*3/uL (ref 0.7–4.0)
MCH: 30.9 pg (ref 26.0–34.0)
MCHC: 32.7 g/dL (ref 30.0–36.0)
MCV: 94.4 fL (ref 80.0–100.0)
Monocytes Absolute: 1 10*3/uL (ref 0.1–1.0)
Monocytes Relative: 11 %
Neutro Abs: 6.6 10*3/uL (ref 1.7–7.7)
Neutrophils Relative %: 74 %
Platelets: 578 10*3/uL — ABNORMAL HIGH (ref 150–400)
RBC: 2.69 MIL/uL — ABNORMAL LOW (ref 3.87–5.11)
RDW: 15 % (ref 11.5–15.5)
WBC: 9 10*3/uL (ref 4.0–10.5)
nRBC: 0 % (ref 0.0–0.2)

## 2022-08-24 LAB — GLUCOSE, CAPILLARY
Glucose-Capillary: 310 mg/dL — ABNORMAL HIGH (ref 70–99)
Glucose-Capillary: 231 mg/dL — ABNORMAL HIGH (ref 70–99)
Glucose-Capillary: 131 mg/dL — ABNORMAL HIGH (ref 70–99)
Glucose-Capillary: 56 mg/dL — ABNORMAL LOW (ref 70–99)
Glucose-Capillary: 178 mg/dL — ABNORMAL HIGH (ref 70–99)
Glucose-Capillary: 207 mg/dL — ABNORMAL HIGH (ref 70–99)
Glucose-Capillary: 276 mg/dL — ABNORMAL HIGH (ref 70–99)
Glucose-Capillary: 279 mg/dL — ABNORMAL HIGH (ref 70–99)

## 2022-08-24 MED ORDER — DEXTROSE 50 % IV SOLN
INTRAVENOUS | Status: AC
  Administered 2022-08-24: 50 mL
  Filled 2022-08-24: qty 50

## 2022-08-24 NOTE — Progress Notes (Signed)
PROGRESS NOTE  Heather [name]  DOB: [birth year]  PCP: [name], [place] NID:[id number]  DOA: 08/04/2022  LOS: 67 days  Hospital Day: 21  Brief narrative: Heather [name] is a 71 y.o. female with PMH significant for DM2, neuropathy, HTN, HLD, COPD, GERD, PAD, AS s/p TAVR, chronic diastolic CHF, anxiety/depression/schizophrenia, tobacco abuse. 12/11, patient presented to ED with sudden onset of dizziness, blurred vision, headache, nausea and vomiting.  Blood pressure was significantly elevated to over 144 systolic with EMS. She was admitted to ICU for hemorrhagic stroke and bilateral lower extremity acute on chronic ischemia.   Vascular surgery was consulted and s/p bilateral lower extremity femoral endarterectomy and patch angioplasty with iliac stenting, 4 compartment fasciotomies for acute on chronic lower extremity ischemia.  Postop remained on vent and Cleviprex.   Transferred to floor and TRH on 12/17.   Neurology and vascular surgery following.    Subjective: Patient was seen and examined this morning.  Propped up in bed.  Not in distress.  NG to be in place.  No family at bedside.  Diarrhea improved. Blood sugar level still remains elevated.  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  CK level was elevated to thousands.  Gradually downtrended. However LLE ischemia did not improve, patient started having rhabdomyolysis. Patient ultimately required left AKA on 12/24.  Wound culture grew E. coli and Proteus Mirabilis. Currently on IV Ancef and IV Flagyl. Continue wound VAC changes 3 times a week per vascular surgery. Previously on heparin drip.  Currently on Plavix 75 Mg daily.    Acute intracranial hemorrhage  Initial CT scan showed  acute intraparenchymal hematoma within the cerebellar vermis measuring 11 cc in volume and mild mass effect upon the fourth ventricle. Confirmed with an MRI. Neurology was consulted. Eventually patient was started on antiplatelet agents.  Currently on Plavix 75 mg daily.    Acute respiratory failure with hypoxia COPD, everyday smoker Extubated 12/14. Currently on low-flow oxygen. Continue Brovana twice daily, Pulmicort twice daily, Yupelri daily, albuterol PRN  Uncontrolled type 2 diabetes mellitus A1c 12.9 on 08/05/2022 Blood sugar level running elevated as below. Currently on Semglee to 30 units as well as Premeal insulin to 6 units every 4 hours and sliding scale insulin with Accu-Cheks.  Continue monitor.  May need further adjustment tomorrow. Recent Labs  Lab 08/23/22 1659 08/23/22 2007 08/23/22 2344 08/24/22 0342 08/24/22 0723  GLUCAP 133* 169* 226* 310* 231*    Chronic diastolic CHF HTN  Received intermittent dialysis while in ICU.   Currently blood pressure controlled on Coreg 12.5 mg BID.  Hydralazine as needed. Echo 12/11 with EF 60 to 65%, no WMA, indeterminate diastolic parameters.   Hx of CAD, HLD AS s/p TAVR. On Plavix 75 mg daily, Lipitor 80 mg daily.  Acute anemia Received 3 units of PRBCs this hospitalization.  Hemoglobin running stable over 8 lately. Recent Labs    08/20/22 0645 08/21/22 0530 08/22/22 0627 08/23/22 0700 08/24/22 0500  HGB 8.1* 8.7* 8.7* 8.6* 8.3*  MCV 96.7 95.8 94.7 95.1 94.4      Hx of depression, bipolar, schizophrenia, insomnia. Currently on Lexapro daily, Remeron nightly, trazodone nightly.,    Dysphagia Likely related to hemorrhagic stroke Has left nasal core track with ongoing tube feeds. Speech therapy following.  Dysphagia diet currently. May need PEG  tube eventually.  I discussed this option with patient's daughters on 12/29.  They will discuss in the family and reach out to [place].  Diarrhea 12/27, patient had  significant loose bowel movement.  Noticed that she was not scheduled bowel regimen.  I stopped it.  Diarrhea improved.  Goals of care   Code Status: Full Code    Mobility: Encourage ambulation  Scheduled Meds:  arformoterol  15 mcg Nebulization BID   atorvastatin  80 mg Per Tube Daily   budesonide (PULMICORT) nebulizer solution  0.5 mg Nebulization BID   carvedilol  12.5 mg Per Tube BID WC   Chlorhexidine Gluconate Cloth  6 each Topical Q0600   clopidogrel  75 mg Oral Daily   escitalopram  10 mg Per Tube Daily   feeding supplement  237 mL Oral TID WC   feeding supplement (PIVOT 1.5 CAL)  1,000 mL Per Tube Q24H   feeding supplement (PROSource TF20)  60 mL Per Tube Daily   heparin injection (subcutaneous)  5,000 Units Subcutaneous Q8H   insulin aspart  0-15 Units Subcutaneous Q4H   insulin aspart  6 Units Subcutaneous Q4H   insulin glargine-yfgn  30 Units Subcutaneous Daily   metroNIDAZOLE  500 mg Per Tube Q12H   mirtazapine  7.5 mg Per Tube QHS   multivitamin with minerals  1 tablet Per Tube Daily   nicotine  14 mg Transdermal Daily   mouth rinse  15 mL Mouth Rinse Q4H   oxyCODONE  10 mg Oral Q12H   pantoprazole  40 mg Oral Daily   revefenacin  175 mcg Nebulization Daily   sodium chloride flush  10-40 mL Intracatheter Q12H    PRN meds: acetaminophen **OR** acetaminophen (TYLENOL) oral liquid 160 mg/5 mL **OR** acetaminophen, albuterol, hydrALAZINE, HYDROmorphone (DILAUDID) injection, ondansetron (ZOFRAN) IV, mouth rinse, oxyCODONE, sodium chloride flush, traZODone   Infusions:    ceFAZolin (ANCEF) IV 2 g (08/24/22 0410)    Skin assessment:  Pressure Injury 08/04/22 Sacrum Stage 2 -  Partial thickness loss of dermis presenting as a shallow open injury with a red, pink wound bed without slough. (Active)  08/04/22 0100  Location: Sacrum  Location Orientation:   Staging: Stage 2 -  Partial thickness loss of dermis presenting as a shallow open injury with a red, pink wound  bed without slough.  Wound Description (Comments):   Present on Admission: Yes    Nutritional status:  Body mass index is 29.84 kg/m.  Nutrition Problem: Increased nutrient needs Etiology: wound healing Signs/Symptoms: estimated needs     Diet:  Diet Order             DIET - DYS 1 Room service appropriate? No; Fluid consistency: Thin  Diet effective now                   DVT prophylaxis:  heparin injection 5,000 Units Start: 08/10/22 1600   Antimicrobials: Ancef, Flagyl Fluid: None Consultants: Vascular surgery Family Communication: 12/29, I called and discussed with patient's daughter Ms. [name] and ask about PEG tube placement.  She is going to discuss with other family members before deciding.  I called and checked again today 12/31.  It sounds like family wants to see another 79 for 48 hours how her appetite improves.  Status is: Inpatient  Continue in-hospital care because: Pending clinical improvement Level of care: Telemetry Medical   Dispo: The patient is from: Home  Anticipated d/c is to: SNF              Patient currently is not medically stable to d/c.   Difficult to place patient No    Antimicrobials: Anti-infectives (From admission, onward)    Start     Dose/Rate Route Frequency Ordered Stop   08/20/22 2200  metroNIDAZOLE (FLAGYL) IVPB 500 mg  Status:  Discontinued        500 mg 100 mL/hr over 60 Minutes Intravenous Every 12 hours 08/20/22 1110 08/20/22 1111   08/20/22 2200  metroNIDAZOLE (FLAGYL) tablet 500 mg        500 mg Per Tube Every 12 hours 08/20/22 1111     08/18/22 2300  ceFAZolin (ANCEF) IVPB 2g/100 mL premix        2 g 200 mL/hr over 30 Minutes Intravenous Every 8 hours 08/18/22 1455     08/18/22 2300  metroNIDAZOLE (FLAGYL) IVPB 500 mg  Status:  Discontinued        500 mg 100 mL/hr over 60 Minutes Intravenous Every 6 hours 08/18/22 1455 08/20/22 1110   08/15/22 1100  vancomycin (VANCOCIN) IVPB 1000 mg/200 mL premix   Status:  Discontinued        1,000 mg 200 mL/hr over 60 Minutes Intravenous Every 24 hours 08/14/22 1006 08/15/22 1307   08/14/22 1400  piperacillin-tazobactam (ZOSYN) IVPB 3.375 g  Status:  Discontinued        3.375 g 12.5 mL/hr over 240 Minutes Intravenous Every 8 hours 08/14/22 1006 08/18/22 1455   08/14/22 1100  vancomycin (VANCOREADY) IVPB 1500 mg/300 mL        1,500 mg 150 mL/hr over 120 Minutes Intravenous  Once 08/14/22 1006 08/14/22 1502   08/04/22 1045  ceFAZolin (ANCEF) IVPB 2g/100 mL premix        2 g 200 mL/hr over 30 Minutes Intravenous On call to O.R. 08/04/22 0958 08/04/22 1100       Objective: Vitals:   08/24/22 0818 08/24/22 0822  BP:    Pulse:    Resp:    Temp:    SpO2: 93% 99%    Intake/Output Summary (Last 24 hours) at 08/24/2022 1040 Last data filed at 08/24/2022 0731 Gross per 24 hour  Intake 120 ml  Output 1950 ml  Net -1830 ml    Filed Weights   08/17/22 0500 08/19/22 0420 08/20/22 0500  Weight: 71.6 kg 66.2 kg 69.3 kg   Weight change:  Body mass index is 29.84 kg/m.   Physical Exam: General exam: Pleasant, elderly Caucasian female.  Pain controlled.  NG tube feeding ongoing. Skin: No rashes, lesions or ulcers. HEENT: Atraumatic, normocephalic, no obvious bleeding. Lungs: Clear to auscultation bilaterally CVS: Regular rate and rhythm, no murmur GI/Abd soft, nondistended, nontender, bowel sound present CNS: Somnolent because of the effect of pain medicine. Psychiatry: Sad affect Extremities: No pedal edema, no calf tenderness.  Left AKA stump clean.  Wound VAC in the groin  Data Review: I have personally reviewed the laboratory data and studies available.  F/u labs ordered Unresulted Labs (From admission, onward)     Start     Ordered   08/23/22 0500  CBC with Differential/Platelet  Tomorrow morning,   R       Question:  Specimen collection method  Answer:  Unit=Unit collect   08/22/22 0801   08/18/22 0500  CBC with  Differential/Platelet  Daily,   R     Question:  Specimen collection method  Answer:  Lab=Lab collect  08/17/22 1947            Total time spent in review of labs and imaging, patient evaluation, formulation of plan, documentation and communication with family 18 minutes  Signed, Terrilee Croak, MD [facility] 08/24/2022

## 2022-08-24 NOTE — Progress Notes (Addendum)
  Progress Note    08/24/2022 7:15 AM 10 Days Post-Op  Subjective:  receiving breathing treatment when I entered room with Respiratory Therapist at bedside   Vitals:   08/23/22 2300 08/24/22 0300  BP: (!) 119/39 (!) 108/46  Pulse: 84 85  Resp: 12 15  Temp: 98.7 F (37.1 C) 98.7 F (37.1 C)  SpO2: (!) 88% 92%   Physical Exam: Cardiac:  regular Lungs:  non labored Incisions:  B groins with wound VACs to suction, right leg incision is c/di. Left AKA healing well Extremities:  RLE well perfused and warm with doppler DP/PT signals. Right calf soft Abdomen:  obese, soft, non distended Neurologic: alert and oriented  CBC    Component Value Date/Time   WBC 10.9 (H) 08/23/2022 0700   RBC 2.86 (L) 08/23/2022 0700   HGB 8.6 (L) 08/23/2022 0700   HGB 14.3 06/20/2012 2127   HCT 27.2 (L) 08/23/2022 0700   HCT 42.9 06/20/2012 2127   PLT 641 (H) 08/23/2022 0700   PLT 357 06/20/2012 2127   MCV 95.1 08/23/2022 0700   MCV 91 06/20/2012 2127   MCH 30.1 08/23/2022 0700   MCHC 31.6 08/23/2022 0700   RDW 15.2 08/23/2022 0700   RDW 13.6 06/20/2012 2127   LYMPHSABS 1.1 08/23/2022 0700   MONOABS 1.1 (H) 08/23/2022 0700   EOSABS 0.2 08/23/2022 0700   BASOSABS 0.1 08/23/2022 0700    BMET    Component Value Date/Time   NA 138 08/23/2022 0700   NA 135 (L) 06/20/2012 2127   K 4.4 08/23/2022 0700   K 4.0 06/20/2012 2127   CL 97 (L) 08/23/2022 0700   CL 100 06/20/2012 2127   CO2 31 08/23/2022 0700   CO2 27 06/20/2012 2127   GLUCOSE 258 (H) 08/23/2022 0700   GLUCOSE 250 (H) 06/20/2012 2127   BUN 32 (H) 08/23/2022 0700   BUN 20 (H) 06/20/2012 2127   CREATININE 0.74 08/23/2022 0700   CREATININE 0.60 06/20/2012 2127   CALCIUM 8.8 (L) 08/23/2022 0700   CALCIUM 8.8 06/20/2012 2127   GFRNONAA >60 08/23/2022 0700   GFRNONAA >60 06/20/2012 2127   GFRAA >60 06/20/2012 2127    INR    Component Value Date/Time   INR 1.0 08/03/2022 2156     Intake/Output Summary (Last 24 hours)  at 08/24/2022 0715 Last data filed at 08/23/2022 1645 Gross per 24 hour  Intake 340 ml  Output 1450 ml  Net -1110 ml     Assessment/Plan:  71 y.o. female is s/p bilateral iliofemoral endarterectomies, lower extremity embolectomies with fasciotomies, L AKA and bilateral groin washout with wound vac placement    10 Days Post-Op   Left AKA intact and well appearing Right leg incision is intact and healing well. RLE well perfused with Doppler DP/ PT signals Bilateral groin incisions with VAC to suction. VACs changed today by [place] RN. Next VAC change on Wednesday 08/27/22 Tube feed for nutritional support On Ancef and Flagyl for e. Coli and proteus mirabilis Continue Plavix, statin and Aspirin Continue PT/OT  DVT prophylaxis:  sq heparin    Karoline Caldwell, PA-C [facility] [phone] 08/24/2022 7:15 AM  VASCULAR STAFF ADDENDUM: I have independently interviewed and examined the patient. I agree with the above.   Yevonne Aline. Stanford Breed, MD [facility] Phone Number: [phone] 08/24/2022 12:29 PM

## 2022-08-24 NOTE — Consult Note (Signed)
[place] Nurse wound follow up Wound type: bilateral groin wounds.  NPWT (VAC) therapy in place.  Y connector used to accommodate both wounds.  Measurement: Left inguinal wound:  5 cm x 4.5 cm x 3 cm  Right inguinal 5 cm x 7.2 cm x 3 cm  Wound VZD:GLOVF red Drainage (amount, consistency, odor) minimal serosanguinous in canister and lymphatic drainage noted during dressing change Periwound: Medical adhesive related skin injury to periwound skin on right inguinal wound.  Ruptured blisters.  PEriwound is being protected with barrier ring to promote seal and protect skin.   Dressing procedure/placement/frequency: Removed old dressing, 1 piece white, 1 piece black foam on each side.  Wound cleansed with NS and 1 piece white, 1 piece black foam replaced in wound bed.  Barrier ring to periwound on each side to promote seal.  Covered with drape and seal achieved at 125 mmHg.  Bedside RN assisted today and indicated that previous dressing had been alarming blockage at times and he was able to repair dressing.  This continues to be an ongoing issue for these wound sites.  IF seal is lost and cannot be repaired, bedside RN should remove both dressings and apply NS moist gauze dressings daily.   [place] team will change again on Wednesday.  Will follow.  Estrellita Ludwig MSN, RN, FNP-BC CWON Wound, Ostomy, Continence Nurse [facility] [phone] Pager [phone]

## 2022-08-25 DIAGNOSIS — K612 Anorectal abscess: Secondary | ICD-10-CM | POA: Diagnosis not present

## 2022-08-25 LAB — CBC WITH DIFFERENTIAL/PLATELET
Abs Immature Granulocytes: 0.06 10*3/uL (ref 0.00–0.07)
Basophils Absolute: 0.1 10*3/uL (ref 0.0–0.1)
Basophils Relative: 1 %
Eosinophils Absolute: 0.2 10*3/uL (ref 0.0–0.5)
Eosinophils Relative: 2 %
HCT: 25.1 % — ABNORMAL LOW (ref 36.0–46.0)
Hemoglobin: 8 g/dL — ABNORMAL LOW (ref 12.0–15.0)
Immature Granulocytes: 1 %
Lymphocytes Relative: 13 %
Lymphs Abs: 1 10*3/uL (ref 0.7–4.0)
MCH: 30.1 pg (ref 26.0–34.0)
MCHC: 31.9 g/dL (ref 30.0–36.0)
MCV: 94.4 fL (ref 80.0–100.0)
Monocytes Absolute: 1 10*3/uL (ref 0.1–1.0)
Monocytes Relative: 12 %
Neutro Abs: 5.6 10*3/uL (ref 1.7–7.7)
Neutrophils Relative %: 71 %
Platelets: 541 10*3/uL — ABNORMAL HIGH (ref 150–400)
RBC: 2.66 MIL/uL — ABNORMAL LOW (ref 3.87–5.11)
RDW: 15.2 % (ref 11.5–15.5)
WBC: 7.8 10*3/uL (ref 4.0–10.5)
nRBC: 0 % (ref 0.0–0.2)

## 2022-08-25 LAB — GLUCOSE, CAPILLARY
Glucose-Capillary: 311 mg/dL — ABNORMAL HIGH (ref 70–99)
Glucose-Capillary: 273 mg/dL — ABNORMAL HIGH (ref 70–99)
Glucose-Capillary: 123 mg/dL — ABNORMAL HIGH (ref 70–99)
Glucose-Capillary: 110 mg/dL — ABNORMAL HIGH (ref 70–99)
Glucose-Capillary: 252 mg/dL — ABNORMAL HIGH (ref 70–99)
Glucose-Capillary: 248 mg/dL — ABNORMAL HIGH (ref 70–99)

## 2022-08-25 MED ORDER — INSULIN ASPART 100 UNIT/ML IJ SOLN
2.0000 [IU] | Freq: Three times a day (TID) | INTRAMUSCULAR | Status: DC
  Administered 2022-08-27 – 2022-09-21 (×39): 2 [IU] via SUBCUTANEOUS

## 2022-08-25 NOTE — Progress Notes (Addendum)
  Progress Note    08/25/2022 8:33 AM 11 Days Post-Op  Subjective:  says she is feeling better this morning. Pain better controlled in BLE   Vitals:   08/25/22 0819 08/25/22 0825  BP: (!) 144/51   Pulse: 94 94  Resp: 20 20  Temp: 98.6 F (37 C)   SpO2: 95% 99%   Physical Exam: Cardiac:  regular Lungs:  non labored Incisions:  B groin incisions with VAC to suction, good seal. Left AKA healing well, staples intact. Viable flaps. Right lower leg incision with staples intact and well appearing.  Extremities:  RLE well perfused and warm with doppler DP/PT signals Abdomen:  obese, soft Neurologic: alert and oriented  CBC    Component Value Date/Time   WBC 7.8 08/25/2022 0640   RBC 2.66 (L) 08/25/2022 0640   HGB 8.0 (L) 08/25/2022 0640   HGB 14.3 06/20/2012 2127   HCT 25.1 (L) 08/25/2022 0640   HCT 42.9 06/20/2012 2127   PLT 541 (H) 08/25/2022 0640   PLT 357 06/20/2012 2127   MCV 94.4 08/25/2022 0640   MCV 91 06/20/2012 2127   MCH 30.1 08/25/2022 0640   MCHC 31.9 08/25/2022 0640   RDW 15.2 08/25/2022 0640   RDW 13.6 06/20/2012 2127   LYMPHSABS 1.0 08/25/2022 0640   MONOABS 1.0 08/25/2022 0640   EOSABS 0.2 08/25/2022 0640   BASOSABS 0.1 08/25/2022 0640    BMET    Component Value Date/Time   NA 138 08/23/2022 0700   NA 135 (L) 06/20/2012 2127   K 4.4 08/23/2022 0700   K 4.0 06/20/2012 2127   CL 97 (L) 08/23/2022 0700   CL 100 06/20/2012 2127   CO2 31 08/23/2022 0700   CO2 27 06/20/2012 2127   GLUCOSE 258 (H) 08/23/2022 0700   GLUCOSE 250 (H) 06/20/2012 2127   BUN 32 (H) 08/23/2022 0700   BUN 20 (H) 06/20/2012 2127   CREATININE 0.74 08/23/2022 0700   CREATININE 0.60 06/20/2012 2127   CALCIUM 8.8 (L) 08/23/2022 0700   CALCIUM 8.8 06/20/2012 2127   GFRNONAA >60 08/23/2022 0700   GFRNONAA >60 06/20/2012 2127   GFRAA >60 06/20/2012 2127    INR    Component Value Date/Time   INR 1.0 08/03/2022 2156     Intake/Output Summary (Last 24 hours) at 08/25/2022  8295 Last data filed at 08/25/2022 6213 Gross per 24 hour  Intake --  Output 1000 ml  Net -1000 ml     Assessment/Plan:  72 y.o. female is s/p  bilateral iliofemoral endarterectomies, lower extremity embolectomies with fasciotomies, L AKA and bilateral groin washout with wound vac placement   11 Days Post-Op   Pain better controlled Left AKA intact and well appearing Right leg incision is intact and healing well. RLE well perfused with Doppler DP/ PT signals Bilateral groin incisions with VAC to suction. Next VAC change on Wednesday 08/27/22 Tube feed for nutritional support On Ancef and Flagyl for e. Coli and proteus mirabilis Continue Plavix, statin and Aspirin Continue PT/OT   Karoline Caldwell, PA-C [facility] [phone] 08/25/2022 8:33 AM  VASCULAR STAFF ADDENDUM: I have independently interviewed and examined the patient. I agree with the above.   Yevonne Aline. Stanford Breed, MD [facility] Phone Number: [phone] 08/25/2022 9:55 AM

## 2022-08-25 NOTE — Progress Notes (Signed)
PROGRESS NOTE  Heather [name]  DOB: 1951/08/07  PCP: [name], [place] QAS:[id number]  DOA: 08/04/2022  LOS: 21 days  Hospital Day: 41  Brief narrative: Heather [name] is a 72 y.o. female with PMH significant for DM2, neuropathy, HTN, HLD, COPD, GERD, PAD, AS s/p TAVR, chronic diastolic CHF, anxiety/depression/schizophrenia, tobacco abuse. 12/11, patient presented to ED with sudden onset of dizziness, blurred vision, headache, nausea and vomiting.  Blood pressure was significantly elevated to over 798 systolic with EMS. She was admitted to ICU for hemorrhagic stroke and bilateral lower extremity acute on chronic ischemia.   Vascular surgery was consulted and s/p bilateral lower extremity femoral endarterectomy and patch angioplasty with iliac stenting, 4 compartment fasciotomies for acute on chronic lower extremity ischemia.  Postop remained on vent and Cleviprex.   Transferred to floor and TRH on 12/17.   Neurology and vascular surgery following.    Subjective: Patient was seen and examined this morning.  Propped up in bed.  Not in distress.  Conversational.  No family at bedside.  Patient seems motivated to eat better.  She wants to avoid PEG tube at this time.  Better blood sugar control.  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  CK level was elevated to thousands.  Gradually downtrended. However LLE ischemia did not improve, patient started having rhabdomyolysis. Patient ultimately required left AKA on 12/24.  Wound culture grew E. coli and Proteus Mirabilis. Currently on IV Ancef and IV Flagyl. Continue wound VAC changes 3 times a week per vascular surgery.  Noted to plan to change next on 1/3 Previously on heparin drip.  Currently on  Plavix 75 Mg daily.    Acute intracranial hemorrhage  Initial CT scan showed acute intraparenchymal hematoma within the cerebellar vermis measuring 11 cc in volume and mild mass effect upon the fourth ventricle. Confirmed with an MRI. Neurology was consulted. Eventually patient was started on antiplatelet agents.  Currently on Plavix 75 mg daily.    Acute respiratory failure with hypoxia COPD, everyday smoker Extubated 12/14. Currently on low-flow oxygen. Continue Brovana twice daily, Pulmicort twice daily, Yupelri daily, albuterol PRN  Uncontrolled type 2 diabetes mellitus A1c 12.9 on 08/05/2022 Blood sugar control is brittle.  I went up on the dose of insulin yesterday.  She had low blood sugar level in 60s yesterday.  Adjustments made.  Blood sugar over 300 this morning.  For now, I will continue Semglee 30 units, reduced dose of Premeal scheduled insulin from 6 units to 2 units Premeal.  Continue sliding scale insulin with Accu-Cheks.  Continue to monitor Recent Labs  Lab 08/24/22 1624 08/24/22 2014 08/24/22 2316 08/25/22 0327 08/25/22 0819  GLUCAP 207* 276* 279* 311* 273*   Chronic diastolic CHF HTN  Received intermittent dialysis while in ICU.   Currently blood pressure controlled on Coreg 12.5 mg BID.  Hydralazine as needed. Echo 12/11 with EF 60 to 65%, no WMA, indeterminate diastolic parameters.   Hx of CAD, HLD AS s/p TAVR. On Plavix 75 mg daily, Lipitor 80 mg daily.  Acute anemia Received 3 units of PRBCs this hospitalization.  Hemoglobin slightly downtrending but remains above 8. Recent Labs    08/21/22 0530 08/22/22 0627 08/23/22 0700 08/24/22 0500 08/25/22 0640  HGB 8.7* 8.7* 8.6* 8.3* 8.0*  MCV 95.8 94.7 95.1 94.4 94.4  Hx of depression, bipolar, schizophrenia, insomnia. Currently on Lexapro daily, Remeron nightly, trazodone nightly.,    Dysphagia Likely related to hemorrhagic stroke Has left nasal core track with ongoing tube feeds. Speech  therapy following.  Dysphagia diet currently. After my conversation with family yesterday 1/1, patient and family wanted to try regular diet and hence I put her on regular diet. Patient and family want to hold off on PEG tube insertion at this time.  Diarrhea 12/27, patient had significant loose bowel movement.  Noticed that she was not scheduled bowel regimen.  I stopped it.  Diarrhea improved.  Goals of care   Code Status: Full Code    Mobility: Encourage ambulation  Scheduled Meds:  arformoterol  15 mcg Nebulization BID   atorvastatin  80 mg Per Tube Daily   budesonide (PULMICORT) nebulizer solution  0.5 mg Nebulization BID   carvedilol  12.5 mg Per Tube BID WC   Chlorhexidine Gluconate Cloth  6 each Topical Q0600   clopidogrel  75 mg Oral Daily   escitalopram  10 mg Per Tube Daily   feeding supplement  237 mL Oral TID WC   feeding supplement (PIVOT 1.5 CAL)  1,000 mL Per Tube Q24H   feeding supplement (PROSource TF20)  60 mL Per Tube Daily   heparin injection (subcutaneous)  5,000 Units Subcutaneous Q8H   insulin aspart  0-15 Units Subcutaneous Q4H   insulin aspart  2 Units Subcutaneous TID WC   insulin glargine-yfgn  30 Units Subcutaneous Daily   metroNIDAZOLE  500 mg Per Tube Q12H   mirtazapine  7.5 mg Per Tube QHS   multivitamin with minerals  1 tablet Per Tube Daily   nicotine  14 mg Transdermal Daily   mouth rinse  15 mL Mouth Rinse Q4H   oxyCODONE  10 mg Oral Q12H   pantoprazole  40 mg Oral Daily   revefenacin  175 mcg Nebulization Daily   sodium chloride flush  10-40 mL Intracatheter Q12H    PRN meds: acetaminophen **OR** acetaminophen (TYLENOL) oral liquid 160 mg/5 mL **OR** acetaminophen, albuterol, hydrALAZINE, HYDROmorphone (DILAUDID) injection, ondansetron (ZOFRAN) IV, mouth rinse, oxyCODONE, sodium chloride flush, traZODone   Infusions:    ceFAZolin (ANCEF) IV 2 g (08/25/22 0600)    Skin assessment:  Pressure Injury 08/04/22 Sacrum Stage 2 -  Partial  thickness loss of dermis presenting as a shallow open injury with a red, pink wound bed without slough. (Active)  08/04/22 0100  Location: Sacrum  Location Orientation:   Staging: Stage 2 -  Partial thickness loss of dermis presenting as a shallow open injury with a red, pink wound bed without slough.  Wound Description (Comments):   Present on Admission: Yes    Nutritional status:  Body mass index is 29.84 kg/m.  Nutrition Problem: Increased nutrient needs Etiology: wound healing Signs/Symptoms: estimated needs     Diet:  Diet Order             DIET DYS 3 Room service appropriate? No; Fluid consistency: Thin  Diet effective now                   DVT prophylaxis:  heparin injection 5,000 Units Start: 08/10/22 1600   Antimicrobials: Ancef, Flagyl Fluid: None Consultants: Vascular surgery Family Communication: Last discussed with patient's daughters on 12/31. Status is: Inpatient  Continue in-hospital care because: Pending clinical improvement Level of care: Telemetry Medical   Dispo: The patient is from: Home  Anticipated d/c is to: SNF              Patient currently is not medically stable to d/c.   Difficult to place patient No    Antimicrobials: Anti-infectives (From admission, onward)    Start     Dose/Rate Route Frequency Ordered Stop   08/20/22 2200  metroNIDAZOLE (FLAGYL) IVPB 500 mg  Status:  Discontinued        500 mg 100 mL/hr over 60 Minutes Intravenous Every 12 hours 08/20/22 1110 08/20/22 1111   08/20/22 2200  metroNIDAZOLE (FLAGYL) tablet 500 mg        500 mg Per Tube Every 12 hours 08/20/22 1111     08/18/22 2300  ceFAZolin (ANCEF) IVPB 2g/100 mL premix        2 g 200 mL/hr over 30 Minutes Intravenous Every 8 hours 08/18/22 1455     08/18/22 2300  metroNIDAZOLE (FLAGYL) IVPB 500 mg  Status:  Discontinued        500 mg 100 mL/hr over 60 Minutes Intravenous Every 6 hours 08/18/22 1455 08/20/22 1110   08/15/22 1100  vancomycin  (VANCOCIN) IVPB 1000 mg/200 mL premix  Status:  Discontinued        1,000 mg 200 mL/hr over 60 Minutes Intravenous Every 24 hours 08/14/22 1006 08/15/22 1307   08/14/22 1400  piperacillin-tazobactam (ZOSYN) IVPB 3.375 g  Status:  Discontinued        3.375 g 12.5 mL/hr over 240 Minutes Intravenous Every 8 hours 08/14/22 1006 08/18/22 1455   08/14/22 1100  vancomycin (VANCOREADY) IVPB 1500 mg/300 mL        1,500 mg 150 mL/hr over 120 Minutes Intravenous  Once 08/14/22 1006 08/14/22 1502   08/04/22 1045  ceFAZolin (ANCEF) IVPB 2g/100 mL premix        2 g 200 mL/hr over 30 Minutes Intravenous On call to O.R. 08/04/22 0958 08/04/22 1100       Objective: Vitals:   08/25/22 0819 08/25/22 0825  BP: (!) 144/51   Pulse: 94 94  Resp: 20 20  Temp: 98.6 F (37 C)   SpO2: 95% 99%    Intake/Output Summary (Last 24 hours) at 08/25/2022 0913 Last data filed at 08/25/2022 0823 Gross per 24 hour  Intake --  Output 1000 ml  Net -1000 ml   Filed Weights   08/17/22 0500 08/19/22 0420 08/20/22 0500  Weight: 71.6 kg 66.2 kg 69.3 kg   Weight change:  Body mass index is 29.84 kg/m.   Physical Exam: General exam: Pleasant, elderly Caucasian female.  Pain controlled.  NG tube feeding ongoing. Skin: No rashes, lesions or ulcers. HEENT: Atraumatic, normocephalic, no obvious bleeding. Lungs: Clear to auscultation bilaterally CVS: Regular rate and rhythm, no murmur GI/Abd soft, nondistended, nontender, bowel sound present CNS: Alert, awake, oriented x 3. Psychiatry: Mood appropriate Extremities: No pedal edema, no calf tenderness.  Left AKA stump clean.  Wound VAC in the groin  Data Review: I have personally reviewed the laboratory data and studies available.  F/u labs ordered Unresulted Labs (From admission, onward)     Start     Ordered   08/23/22 0500  CBC with Differential/Platelet  Tomorrow morning,   R       Question:  Specimen collection method  Answer:  Unit=Unit collect   08/22/22  0801   08/18/22 0500  CBC with Differential/Platelet  Daily,   R     Question:  Specimen collection method  Answer:  Lab=Lab collect  08/17/22 1947            Total time spent in review of labs and imaging, patient evaluation, formulation of plan, documentation and communication with family 22 minutes  Signed, Terrilee Croak, MD [facility] 08/25/2022

## 2022-08-26 DIAGNOSIS — L89152 Pressure ulcer of sacral region, stage 2: Secondary | ICD-10-CM | POA: Diagnosis not present

## 2022-08-26 LAB — CBC WITH DIFFERENTIAL/PLATELET
Abs Immature Granulocytes: 0.05 10*3/uL (ref 0.00–0.07)
Basophils Absolute: 0 10*3/uL (ref 0.0–0.1)
Basophils Relative: 1 %
Eosinophils Absolute: 0.2 10*3/uL (ref 0.0–0.5)
Eosinophils Relative: 2 %
HCT: 27.3 % — ABNORMAL LOW (ref 36.0–46.0)
Hemoglobin: 8.4 g/dL — ABNORMAL LOW (ref 12.0–15.0)
Immature Granulocytes: 1 %
Lymphocytes Relative: 15 %
Lymphs Abs: 1.2 10*3/uL (ref 0.7–4.0)
MCH: 29.6 pg (ref 26.0–34.0)
MCHC: 30.8 g/dL (ref 30.0–36.0)
MCV: 96.1 fL (ref 80.0–100.0)
Monocytes Absolute: 0.9 10*3/uL (ref 0.1–1.0)
Monocytes Relative: 11 %
Neutro Abs: 5.9 10*3/uL (ref 1.7–7.7)
Neutrophils Relative %: 70 %
Platelets: 573 10*3/uL — ABNORMAL HIGH (ref 150–400)
RBC: 2.84 MIL/uL — ABNORMAL LOW (ref 3.87–5.11)
RDW: 14.8 % (ref 11.5–15.5)
WBC: 8.3 10*3/uL (ref 4.0–10.5)
nRBC: 0 % (ref 0.0–0.2)

## 2022-08-26 LAB — GLUCOSE, CAPILLARY
Glucose-Capillary: 154 mg/dL — ABNORMAL HIGH (ref 70–99)
Glucose-Capillary: 190 mg/dL — ABNORMAL HIGH (ref 70–99)
Glucose-Capillary: 222 mg/dL — ABNORMAL HIGH (ref 70–99)
Glucose-Capillary: 254 mg/dL — ABNORMAL HIGH (ref 70–99)
Glucose-Capillary: 263 mg/dL — ABNORMAL HIGH (ref 70–99)
Glucose-Capillary: 269 mg/dL — ABNORMAL HIGH (ref 70–99)

## 2022-08-26 MED ORDER — GERHARDT'S BUTT CREAM
TOPICAL_CREAM | Freq: Two times a day (BID) | CUTANEOUS | Status: DC
  Administered 2022-08-26 – 2022-09-11 (×10): 1 via TOPICAL
  Filled 2022-08-26 (×2): qty 1

## 2022-08-26 NOTE — Progress Notes (Signed)
Physical Therapy Treatment Patient Details Name: Heather [name] MRN: [id number] DOB: 1950/12/21 Today's Date: 08/26/2022   History of Present Illness [name] is a 72 y.o. 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  Found to have mottled LE's after given Narcdipine due to hypertention and went for emergent common femoral and iliofemoral endarterectomy and bilateral 4 compartment fasciotomies on 08/04/22. 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 History of smoker(2ppd), severe AI/AS s/p TAVR, CAD s/p PCI and on DAPT, COPD, PAD, HTN    PT Comments    Pt making steady progress. Focused on bed mobility and sitting balance. Pt motivated to work toward independence. Continue to recommend SNF at DC.    Recommendations for follow up therapy are one component of a multi-disciplinary discharge planning process, led by the attending physician.  Recommendations may be updated based on patient status, additional functional criteria and insurance authorization.  Follow Up Recommendations  Skilled nursing-short term rehab (<3 hours/day) Can patient physically be transported by private vehicle: No   Assistance Recommended at Discharge Frequent or constant Supervision/Assistance  Patient can return home with the following Two people to help with walking and/or transfers;Two people to help with bathing/dressing/bathroom;Help with stairs or ramp for entrance;Assist for transportation;Assistance with cooking/housework   Equipment Recommendations  Other (comment) (to be determined)    Recommendations for Other Services       Precautions / Restrictions Precautions Precautions: Fall;Other (comment) Precaution Comments: bilateral groin wound vac     Mobility  Bed Mobility Overal bed mobility: Needs Assistance Bed Mobility: Rolling, Supine to Sit, Sit to Supine Rolling: Min assist    Supine to sit: Mod assist Sit to supine: +2 for physical assistance, Mod assist   General bed mobility comments: Verbal/tactile cues technique. Assist to bring legs off of bed, turn to side, elevate trunk into sitting and bring hips to EOB. Assist to lower trunk and bring legs back up in bed.    Transfers                   General transfer comment: Used bed pad and +2 total assist to laterally scoot up EOB.    Ambulation/Gait                   Stairs             Wheelchair Mobility    Modified Rankin (Stroke Patients Only)       Balance Overall balance assessment: Needs assistance Sitting-balance support: Single extremity supported, Bilateral upper extremity supported, No upper extremity supported, Feet supported Sitting balance-Leahy Scale: Poor Sitting balance - Comments: Initially min assist for sitting EOB and then progressed to min guard with BUE support. Progressed to single UE support with reaching and then able to sit unsupported with no UE support. Postural control: Posterior lean                                  Cognition Arousal/Alertness: Awake/alert Behavior During Therapy: Flat affect Overall Cognitive Status: Impaired/Different from baseline Area of Impairment: Attention, Memory, Following commands, Awareness, Problem solving, Orientation                 Orientation Level: Time Current Attention Level: Selective Memory: Decreased short-term memory Following Commands: Follows one step commands with increased  time, Follows one step commands consistently   Awareness: Emergent Problem Solving: Requires verbal cues, Requires tactile cues, Slow processing, Decreased initiation, Difficulty sequencing          Exercises      General Comments General comments (skin integrity, edema, etc.): VSS      Pertinent Vitals/Pain Pain Assessment Pain Assessment: Faces Faces Pain Scale: Hurts even more Pain Location:  back, and phantom lt foot pain Pain Descriptors / Indicators: Grimacing, Guarding, Discomfort Pain Intervention(s): Limited activity within patient's tolerance, Monitored during session, Repositioned    Home Living                          Prior Function            PT Goals (current goals can now be found in the care plan section) Acute Rehab PT Goals Patient Stated Goal: to get better Progress towards PT goals: Progressing toward goals    Frequency    Min 2X/week      PT Plan Current plan remains appropriate    Co-evaluation              AM-PAC PT "6 Clicks" Mobility   Outcome Measure  Help needed turning from your back to your side while in a flat bed without using bedrails?: A Little Help needed moving from lying on your back to sitting on the side of a flat bed without using bedrails?: A Lot Help needed moving to and from a bed to a chair (including a wheelchair)?: Total Help needed standing up from a chair using your arms (e.g., wheelchair or bedside chair)?: Total Help needed to walk in hospital room?: Total Help needed climbing 3-5 steps with a railing? : Total 6 Click Score: 9    End of Session Equipment Utilized During Treatment: Oxygen Activity Tolerance: Patient tolerated treatment well Patient left: in bed;with call bell/phone within reach;with bed alarm set   PT Visit Diagnosis: Muscle weakness (generalized) (M62.81);Pain;Other abnormalities of gait and mobility (R26.89) Pain - Right/Left: Left Pain - part of body: Ankle and joints of foot (phantom pain as well as back pain)     Time: 2229-7989 PT Time Calculation (min) (ACUTE ONLY): 30 min  Charges:  $Therapeutic Activity: 8-22 mins                     [place] [facility] [place] 08/26/2022, 11:16 AM

## 2022-08-26 NOTE — Progress Notes (Addendum)
Occupational Therapy Treatment Patient Details Name: Heather [name] MRN: [id number] DOB: May 20, 1951 Today's Date: 08/26/2022   History of present illness Heather [name] is a 72 y.o. 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  Found to have mottled LE's after given Narcdipine due to hypertention and went for emergent common femoral and iliofemoral endarterectomy and bilateral 4 compartment fasciotomies on 08/04/22. 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 History of smoker(2ppd), severe AI/AS s/p TAVR, CAD s/p PCI and on DAPT, COPD, PAD, HTN   OT comments  Patient progressing slowly towards OT goals, continues to require +2 total assist for lateral scoots but demonstrating improved sitting balance and tolerance at EOB today. Limited by back and L LE phantom pain. Incontinent of BM at end of session, total assist for hygiene and pad change; RN notified of and came to assess redness on bottom.  Continue to recommend SNF.  Will follow acutely.    Recommendations for follow up therapy are one component of a multi-disciplinary discharge planning process, led by the attending physician.  Recommendations may be updated based on patient status, additional functional criteria and insurance authorization.    Follow Up Recommendations  Skilled nursing-short term rehab (<3 hours/day)     Assistance Recommended at Discharge Frequent or constant Supervision/Assistance  Patient can return home with the following  Two people to help with walking and/or transfers;A lot of help with bathing/dressing/bathroom;Assistance with cooking/housework;Assistance with feeding;Direct supervision/assist for medications management;Direct supervision/assist for financial management;Assist for transportation;Help with stairs or ramp for entrance   Equipment Recommendations  Other (comment) (defer)    Recommendations  for Other Services      Precautions / Restrictions Precautions Precautions: Fall;Other (comment) Precaution Comments: bilateral groin wound vac Restrictions Weight Bearing Restrictions: Yes RLE Weight Bearing: Weight bearing as tolerated LLE Weight Bearing: Non weight bearing       Mobility Bed Mobility Overal bed mobility: Needs Assistance Bed Mobility: Rolling, Supine to Sit, Sit to Supine Rolling: Min assist   Supine to sit: Mod assist Sit to supine: +2 for physical assistance, Mod assist   General bed mobility comments: Verbal/tactile cues technique. Assist to bring legs off of bed, turn to side, elevate trunk into sitting and bring hips to EOB. Assist to lower trunk and bring legs back up in bed.    Transfers Overall transfer level: Needs assistance   Transfers: Bed to chair/wheelchair/BSC            Lateral/Scoot Transfers: +2 physical assistance, Total assist General transfer comment: Used bed pad and +2 total assist to laterally scoot up EOB.     Balance Overall balance assessment: Needs assistance Sitting-balance support: Single extremity supported, Bilateral upper extremity supported, No upper extremity supported, Feet supported Sitting balance-Leahy Scale: Fair Sitting balance - Comments: Initially min assist for sitting EOB and then progressed to min guard with BUE support. Progressed to single UE support with reaching and then able to sit unsupported with no UE support.                                   ADL either performed or assessed with clinical judgement   ADL Overall ADL's : Needs assistance/impaired     Grooming: Wash/dry face;Min guard;Sitting           Upper Body Dressing :  Minimal assistance;Sitting   Lower Body Dressing: Total assistance;Sitting/lateral leans;Bed level     Toilet Transfer Details (indicate cue type and reason): lateral scoot towards HOB with total assist +2 Toileting- Clothing Manipulation and  Hygiene: Total assistance;+2 for physical assistance;+2 for safety/equipment;Bed level       Functional mobility during ADLs: Total assistance;Moderate assistance;+2 for safety/equipment;+2 for physical assistance      Extremity/Trunk Assessment              Vision       Perception     Praxis      Cognition Arousal/Alertness: Awake/alert Behavior During Therapy: Flat affect Overall Cognitive Status: Impaired/Different from baseline Area of Impairment: Attention, Memory, Following commands, Awareness, Problem solving, Orientation, Safety/judgement                 Orientation Level: Disoriented to, Time Current Attention Level: Sustained Memory: Decreased short-term memory Following Commands: Follows one step commands with increased time, Follows one step commands consistently Safety/Judgement: Decreased awareness of safety, Decreased awareness of deficits Awareness: Emergent Problem Solving: Requires verbal cues, Requires tactile cues, Slow processing, Decreased initiation, Difficulty sequencing General Comments: pt pleasant and cooperative, disoriented to time but follows simple commands with increased time but demonstrates poor awareness, safety and problem solving.        Exercises      Shoulder Instructions       General Comments VSS    Pertinent Vitals/ Pain       Pain Assessment Pain Assessment: Faces Faces Pain Scale: Hurts even more Pain Location: back, and phantom lt foot pain Pain Descriptors / Indicators: Grimacing, Guarding, Discomfort Pain Intervention(s): Limited activity within patient's tolerance, Monitored during session, Repositioned  Home Living                                          Prior Functioning/Environment              Frequency  Min 2X/week        Progress Toward Goals  OT Goals(current goals can now be found in the care plan section)  Progress towards OT goals: Progressing toward  goals  Acute Rehab OT Goals Patient Stated Goal: get better OT Goal Formulation: With patient Time For Goal Achievement: 09/09/22 ADL Goals Pt Will Perform Lower Body Bathing: with min assist;sitting/lateral leans;sit to/from stand;with adaptive equipment Pt Will Perform Lower Body Dressing: with min assist;sit to/from stand;sitting/lateral leans;with adaptive equipment Pt Will Transfer to Toilet: with min assist;bedside commode;squat pivot transfer;with transfer board;with +2 assist Pt Will Perform Toileting - Clothing Manipulation and hygiene: with min assist;sitting/lateral leans Additional ADL Goal #1: Pt will complete bed mobility with min assist and maintain sitting balance dynamically for 5 minutes as precursor to ADLs.  Plan Discharge plan remains appropriate;Frequency remains appropriate    Co-evaluation    PT/OT/SLP Co-Evaluation/Treatment: Yes Reason for Co-Treatment: For patient/therapist safety;To address functional/ADL transfers   OT goals addressed during session: ADL's and self-care      AM-PAC OT "6 Clicks" Daily Activity     Outcome Measure   Help from another person eating meals?: A Little Help from another person taking care of personal grooming?: A Little Help from another person toileting, which includes using toliet, bedpan, or urinal?: Total Help from another person bathing (including washing, rinsing, drying)?: A Lot Help from another person to put on and taking off regular upper  body clothing?: A Little Help from another person to put on and taking off regular lower body clothing?: Total 6 Click Score: 13    End of Session    OT Visit Diagnosis: Unsteadiness on feet (R26.81);Other abnormalities of gait and mobility (R26.89);Muscle weakness (generalized) (M62.81);Pain Pain - Right/Left: Left Pain - part of body: Leg (back)   Activity Tolerance Patient tolerated treatment well;Patient limited by pain   Patient Left in bed;with call bell/phone within  reach;with bed alarm set;with nursing/sitter in room   Nurse Communication Mobility status (redness on bottom)        Time: 1001-1031 OT Time Calculation (min): 30 min  Charges: OT General Charges $OT Visit: 1 Visit OT Treatments $Self Care/Home Management : 8-22 mins  Jolaine Artist, [place] [facility] [place] 08/26/2022, 2:04 PM

## 2022-08-26 NOTE — Progress Notes (Addendum)
  Progress Note    08/26/2022 6:43 AM 12 Days Post-Op  Subjective:  says she's feeling good this morning.  Wants to get up to go to the bathroom for bowel movement  afebrile  Vitals:   08/25/22 2320 08/26/22 0338  BP: (!) 127/43 (!) 143/51  Pulse: 86 92  Resp: 15 17  Temp:  98.8 F (37.1 C)  SpO2: 92% 98%    Physical Exam: General:  no distress Cardiac:  regular Lungs:  non labored Incisions:  bilateral groins with vac in place good seal.  Right fasciotomy sites look good with staples in tact. Left AKA site clean with staples Extremities:  brisk right DP/PT doppler signals.  Right calf is soft and non tender  Abdomen:  soft  CBC    Component Value Date/Time   WBC 8.3 08/26/2022 0555   RBC 2.84 (L) 08/26/2022 0555   HGB 8.4 (L) 08/26/2022 0555   HGB 14.3 06/20/2012 2127   HCT 27.3 (L) 08/26/2022 0555   HCT 42.9 06/20/2012 2127   PLT 573 (H) 08/26/2022 0555   PLT 357 06/20/2012 2127   MCV 96.1 08/26/2022 0555   MCV 91 06/20/2012 2127   MCH 29.6 08/26/2022 0555   MCHC 30.8 08/26/2022 0555   RDW 14.8 08/26/2022 0555   RDW 13.6 06/20/2012 2127   LYMPHSABS 1.2 08/26/2022 0555   MONOABS 0.9 08/26/2022 0555   EOSABS 0.2 08/26/2022 0555   BASOSABS 0.0 08/26/2022 0555    BMET    Component Value Date/Time   NA 138 08/23/2022 0700   NA 135 (L) 06/20/2012 2127   K 4.4 08/23/2022 0700   K 4.0 06/20/2012 2127   CL 97 (L) 08/23/2022 0700   CL 100 06/20/2012 2127   CO2 31 08/23/2022 0700   CO2 27 06/20/2012 2127   GLUCOSE 258 (H) 08/23/2022 0700   GLUCOSE 250 (H) 06/20/2012 2127   BUN 32 (H) 08/23/2022 0700   BUN 20 (H) 06/20/2012 2127   CREATININE 0.74 08/23/2022 0700   CREATININE 0.60 06/20/2012 2127   CALCIUM 8.8 (L) 08/23/2022 0700   CALCIUM 8.8 06/20/2012 2127   GFRNONAA >60 08/23/2022 0700   GFRNONAA >60 06/20/2012 2127   GFRAA >60 06/20/2012 2127    INR    Component Value Date/Time   INR 1.0 08/03/2022 2156     Intake/Output Summary (Last 24  hours) at 08/26/2022 6195 Last data filed at 08/26/2022 0932 Gross per 24 hour  Intake 9109.26 ml  Output 2650 ml  Net 6459.26 ml      Assessment/Plan:  72 y.o. female is s/p:  Left AKA and excisional debridement of bilateral groin wounds with vac placement  12 Days Post-Op And Bilateral CFA endarterectomy with bovine patch angioplasty, bilateral iliofemoral embolectomy, stenting bilateral CIA, stent right EIA and 4 compartment fasciotomies  22 Days Post-Op   -pt with brisk doppler flow right foot -left AKA and right lower leg incisions look good with staples in tact -next vac change tomorrow for bilateral groins -continue plavix/statin -DVT prophylaxis:  sq heparin -Ancef q8h and Flagyl bid for E coli and Proteus mirabilis on wound cx   Leontine Locket, PA-C [facility] [phone] 08/26/2022 6:43 AM   VASCULAR STAFF ADDENDUM: I have independently interviewed and examined the patient. I agree with the above.   Yevonne Aline. Stanford Breed, MD [facility] Phone Number: [phone] 08/26/2022 1:57 PM

## 2022-08-26 NOTE — Progress Notes (Signed)
PROGRESS NOTE  Heather [name]  DOB: 1951-07-26  PCP: [name], [place] GEX:[id number]  DOA: 08/04/2022  LOS: 33 days  Hospital Day: 23  Brief narrative: Heather [name] is a 72 y.o. female with PMH significant for DM2, neuropathy, HTN, HLD, COPD, GERD, PAD, AS s/p TAVR, chronic diastolic CHF, anxiety/depression/schizophrenia, tobacco abuse. 12/11, patient presented to ED with sudden onset of dizziness, blurred vision, headache, nausea and vomiting.  Blood pressure was significantly elevated to over 010 systolic with EMS. She was admitted to ICU for hemorrhagic stroke and bilateral lower extremity acute on chronic ischemia.   Vascular surgery was consulted and s/p bilateral lower extremity femoral endarterectomy and patch angioplasty with iliac stenting, 4 compartment fasciotomies for acute on chronic lower extremity ischemia.  Postop remained on vent and Cleviprex.   Transferred to floor and TRH on 12/17.   Neurology and vascular surgery following.    Subjective: Patient was seen and examined this morning.  Propped up in bed.  Not in distress.  Conversational.  No family at bedside.  Patient has core track feeding as well as oral appetite.  Oral appetite not great however.  Patient wants to avoid PEG tube placement.  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  CK level was elevated to thousands.  Gradually downtrended. However LLE ischemia did not improve, patient started having rhabdomyolysis. Patient ultimately required left AKA on 12/24.  Wound culture grew E. coli and Proteus Mirabilis. Currently on IV Ancef and IV Flagyl. Continue wound VAC changes 3 times a week per vascular surgery.  Noted to plan to change next on 1/3 Previously on  heparin drip. Currently on Plavix 75 Mg daily.    Acute intracranial hemorrhage  Initial CT scan showed acute intraparenchymal hematoma within the cerebellar vermis measuring 11 cc in volume and mild mass effect upon the fourth ventricle. Confirmed with an MRI. Neurology was consulted. Eventually patient was started on antiplatelet agents.  Currently on Plavix 75 mg daily.    Acute respiratory failure with hypoxia COPD, everyday smoker Extubated 12/14. Currently on low-flow oxygen. Continue Brovana twice daily, Pulmicort twice daily, Yupelri daily, albuterol PRN  Uncontrolled type 2 diabetes mellitus A1c 12.9 on 08/05/2022 Blood sugar control is brittle. For now, I will continue Semglee 30 units, reduced dose of Premeal scheduled insulin 2 units TID.  Continue sliding scale insulin with Accu-Cheks.  Continue to monitor Recent Labs  Lab 08/25/22 2011 08/25/22 2319 08/26/22 0336 08/26/22 0757 08/26/22 1230  GLUCAP 252* 248* 269* 254* 190*   Chronic diastolic CHF HTN  Received intermittent dialysis while in ICU.   Currently blood pressure controlled on Coreg 12.5 mg BID.  Hydralazine as needed. Echo 12/11 with EF 60 to 65%, no WMA, indeterminate diastolic parameters.   Hx of CAD, HLD AS s/p TAVR. On Plavix 75 mg daily, Lipitor 80 mg daily.  Acute anemia Received 3 units of PRBCs this hospitalization.  Hemoglobin slightly downtrending but remains above 8. Recent Labs    08/23/22 0700 08/24/22 0500 08/25/22 0640 08/26/22 0420 08/26/22 0555  HGB 8.6* 8.3* 8.0* QUESTIONABLE RESULTS, RECOMMEND RECOLLECT TO VERIFY 8.4*  MCV 95.1 94.4 94.4 QUESTIONABLE RESULTS, RECOMMEND RECOLLECT TO VERIFY 96.1     Hx of depression, bipolar, schizophrenia, insomnia. Currently on Lexapro daily, Remeron nightly, trazodone nightly.,    Dysphagia Likely  related to hemorrhagic stroke Has left nasal core track with ongoing tube feeds. Speech therapy following.  Dysphagia diet currently. After my  conversation with family yesterday 1/1, patient and family wanted to try regular diet and hence I put her on regular diet. Patient and family want to hold off on PEG tube insertion at this time.  Diarrhea 12/27, patient had significant loose bowel movement.  Noticed that she was not scheduled bowel regimen.  I stopped it.  Diarrhea improved.  Goals of care   Code Status: Full Code    Mobility: Encourage ambulation  Scheduled Meds:  arformoterol  15 mcg Nebulization BID   atorvastatin  80 mg Per Tube Daily   budesonide (PULMICORT) nebulizer solution  0.5 mg Nebulization BID   carvedilol  12.5 mg Per Tube BID WC   Chlorhexidine Gluconate Cloth  6 each Topical Q0600   clopidogrel  75 mg Oral Daily   escitalopram  10 mg Per Tube Daily   feeding supplement  237 mL Oral TID WC   feeding supplement (PIVOT 1.5 CAL)  1,000 mL Per Tube Q24H   feeding supplement (PROSource TF20)  60 mL Per Tube Daily   Gerhardt's butt cream   Topical BID   heparin injection (subcutaneous)  5,000 Units Subcutaneous Q8H   insulin aspart  0-15 Units Subcutaneous Q4H   insulin aspart  2 Units Subcutaneous TID WC   insulin glargine-yfgn  30 Units Subcutaneous Daily   metroNIDAZOLE  500 mg Per Tube Q12H   mirtazapine  7.5 mg Per Tube QHS   multivitamin with minerals  1 tablet Per Tube Daily   nicotine  14 mg Transdermal Daily   mouth rinse  15 mL Mouth Rinse Q4H   oxyCODONE  10 mg Oral Q12H   pantoprazole  40 mg Oral Daily   revefenacin  175 mcg Nebulization Daily   sodium chloride flush  10-40 mL Intracatheter Q12H    PRN meds: acetaminophen **OR** acetaminophen (TYLENOL) oral liquid 160 mg/5 mL **OR** acetaminophen, albuterol, hydrALAZINE, HYDROmorphone (DILAUDID) injection, ondansetron (ZOFRAN) IV, mouth rinse, oxyCODONE, sodium chloride flush, traZODone   Infusions:    ceFAZolin (ANCEF) IV Stopped (08/26/22 6712)    Skin assessment:  Pressure Injury 08/04/22 Sacrum Stage 2 -  Partial thickness loss  of dermis presenting as a shallow open injury with a red, pink wound bed without slough. (Active)  08/04/22 0100  Location: Sacrum  Location Orientation:   Staging: Stage 2 -  Partial thickness loss of dermis presenting as a shallow open injury with a red, pink wound bed without slough.  Wound Description (Comments):   Present on Admission: Yes    Nutritional status:  Body mass index is 29.84 kg/m.  Nutrition Problem: Increased nutrient needs Etiology: wound healing Signs/Symptoms: estimated needs     Diet:  Diet Order             DIET DYS 3 Room service appropriate? No; Fluid consistency: Thin  Diet effective now                   DVT prophylaxis:  heparin injection 5,000 Units Start: 08/10/22 1600   Antimicrobials: Ancef, Flagyl Fluid: None Consultants: Vascular surgery Family Communication: Last discussed with patient's daughters on 12/31. Status is: Inpatient  Continue in-hospital care because: Pending clinical improvement Level of care: Telemetry Medical   Dispo: The patient is from: Home              Anticipated d/c is to: SNF  Patient currently is not medically stable to d/c.   Difficult to place patient No    Antimicrobials: Anti-infectives (From admission, onward)    Start     Dose/Rate Route Frequency Ordered Stop   08/20/22 2200  metroNIDAZOLE (FLAGYL) IVPB 500 mg  Status:  Discontinued        500 mg 100 mL/hr over 60 Minutes Intravenous Every 12 hours 08/20/22 1110 08/20/22 1111   08/20/22 2200  metroNIDAZOLE (FLAGYL) tablet 500 mg        500 mg Per Tube Every 12 hours 08/20/22 1111     08/18/22 2300  ceFAZolin (ANCEF) IVPB 2g/100 mL premix        2 g 200 mL/hr over 30 Minutes Intravenous Every 8 hours 08/18/22 1455     08/18/22 2300  metroNIDAZOLE (FLAGYL) IVPB 500 mg  Status:  Discontinued        500 mg 100 mL/hr over 60 Minutes Intravenous Every 6 hours 08/18/22 1455 08/20/22 1110   08/15/22 1100  vancomycin (VANCOCIN) IVPB  1000 mg/200 mL premix  Status:  Discontinued        1,000 mg 200 mL/hr over 60 Minutes Intravenous Every 24 hours 08/14/22 1006 08/15/22 1307   08/14/22 1400  piperacillin-tazobactam (ZOSYN) IVPB 3.375 g  Status:  Discontinued        3.375 g 12.5 mL/hr over 240 Minutes Intravenous Every 8 hours 08/14/22 1006 08/18/22 1455   08/14/22 1100  vancomycin (VANCOREADY) IVPB 1500 mg/300 mL        1,500 mg 150 mL/hr over 120 Minutes Intravenous  Once 08/14/22 1006 08/14/22 1502   08/04/22 1045  ceFAZolin (ANCEF) IVPB 2g/100 mL premix        2 g 200 mL/hr over 30 Minutes Intravenous On call to O.R. 08/04/22 0958 08/04/22 1100       Objective: Vitals:   08/26/22 0744 08/26/22 1231  BP: (!) 158/56 (!) 134/49  Pulse: 92 79  Resp: (!) 21 13  Temp: 99.5 F (37.5 C) 98.9 F (37.2 C)  SpO2: 98% 98%    Intake/Output Summary (Last 24 hours) at 08/26/2022 1408 Last data filed at 08/26/2022 0900 Gross per 24 hour  Intake 9406.26 ml  Output 1150 ml  Net 8256.26 ml   Filed Weights   08/17/22 0500 08/19/22 0420 08/20/22 0500  Weight: 71.6 kg 66.2 kg 69.3 kg   Weight change:  Body mass index is 29.84 kg/m.   Physical Exam: General exam: Pleasant, elderly Caucasian female.  Pain controlled.  Cortrak feeding ongoing. Skin: No rashes, lesions or ulcers. HEENT: Atraumatic, normocephalic, no obvious bleeding. Lungs: Clear to auscultation bilaterally CVS: Regular rate and rhythm, no murmur GI/Abd soft, nondistended, nontender, bowel sound present CNS: Alert, awake, oriented x 3. Psychiatry: Mood appropriate Extremities: No pedal edema, no calf tenderness.  Left AKA stump clean.  Wound VAC in the groin  Data Review: I have personally reviewed the laboratory data and studies available.  F/u labs ordered Unresulted Labs (From admission, onward)     Start     Ordered   08/23/22 0500  CBC with Differential/Platelet  Tomorrow morning,   R       Question:  Specimen collection method  Answer:   Unit=Unit collect   08/22/22 0801   08/18/22 0500  CBC with Differential/Platelet  Daily,   R     Question:  Specimen collection method  Answer:  Lab=Lab collect   08/17/22 1947  Total time spent in review of labs and imaging, patient evaluation, formulation of plan, documentation and communication with family 38 minutes  Signed, Terrilee Croak, MD [facility] 08/26/2022

## 2022-08-26 NOTE — Progress Notes (Signed)
Speech Language Pathology Treatment: Dysphagia;Cognitive-Linquistic  Patient Details Name: Heather [name] MRN: [id number] DOB: 01-10-1951 Today's Date: 08/26/2022 Time: 3267-1245 SLP Time Calculation (min) (ACUTE ONLY): 21 min  Assessment / Plan / Recommendation Clinical Impression  SWALLOWING Pt seen for ongoing dysphagia management.  Diet upgraded back to mechanical soft after family request for regular foods.  Mechanical soft diet is consistent with SLP recommendations from prior FEES and clinical advancement of diet texture.  Pt tolerated mechanical soft solids and thin liquids with no clinical s/s of aspiration.  Pt fed herself diced peaches independently with small bites and prolonged oral phase.  She consumed around 1/3 of peaches cup during today's session.  Although PO intake remains low per RN report, she appears to be adequately protecting airway with her current diet and SLP will sign off for swallowing.  Recommend continuing mechanical soft solids with thin liquids.  COGNITION Pt's cognitive abilities were reassessed today. Pt seen 12/15 for cognitive assessment with presumed ICU delirium.  Today pt presents with persistent severe cognitive impairments.  Pt was assessed using the SLUMS with a score of 5 out of 30 suggestive of a major neurocognitive impairment.  Pt was oriented to place but not time.  She exhibited poor working memory for problem solving tasks (calculations, reverse digit span, and clock drawing) and poor retrieval for both word (20%) and story (0%) recall tasks.  Pt's clock drawing was incomplete with only 4 numbers added.  Hands were inaccurate for time and displaced from center of clock. Pt states that she did not feel her performance today was significantly different from baseline, but she also indicated that she is agreeable to speech therapy to address deficits.  SLP to follow in house for above related deficits.  Pt would likely benefit from ongoing ST at next level of  care.    HPI HPI: 72 yo female smoker presented to ER with sudden onset of dizziness, blurred vision, headache, nausea and vomiting.  BP in EMS 209/76.  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  Also found to have bilateral leg pain with numbness and coolness.  Found to have occlusion of b/l common iliac artery stents, Lt SFA and popliteal stent and vascular surgery consulted.  Taken to OR for b/l common femoral endarterectomy, iliofemoral endarterectomy, lower extremity embolectomy and stent of b/l common iliac arteries and Rt external iliac artery, and b/l 4 compartment fasciotomies.  Remained on vent and cleviprex post op. 12/11-12/14.      SLP Plan  Continue with current plan of care      Recommendations for follow up therapy are one component of a multi-disciplinary discharge planning process, led by the attending physician.  Recommendations may be updated based on patient status, additional functional criteria and insurance authorization.    Recommendations  Diet recommendations: Dysphagia 3 (mechanical soft);Thin liquid Liquids provided via: Cup;Straw Medication Administration:  (As tolerated) Supervision: Patient able to self feed Compensations: Slow rate;Small sips/bites Postural Changes and/or Swallow Maneuvers: Seated upright 90 degrees                Oral Care Recommendations: Oral care BID Follow Up Recommendations: Skilled nursing-short term rehab (<3 hours/day) Assistance recommended at discharge: Intermittent Supervision/Assistance SLP Visit Diagnosis: Dysphagia, oropharyngeal phase (R13.12) Plan: Continue with current plan of care           Celedonio Savage, [place], [place] Office: [phone] 08/26/2022, 11:14 AM

## 2022-08-27 DIAGNOSIS — L89152 Pressure ulcer of sacral region, stage 2: Secondary | ICD-10-CM | POA: Diagnosis not present

## 2022-08-27 LAB — CBC WITH DIFFERENTIAL/PLATELET
Abs Immature Granulocytes: 0.07 10*3/uL (ref 0.00–0.07)
Basophils Absolute: 0.1 10*3/uL (ref 0.0–0.1)
Basophils Relative: 1 %
Eosinophils Absolute: 0.2 10*3/uL (ref 0.0–0.5)
Eosinophils Relative: 3 %
HCT: 25.9 % — ABNORMAL LOW (ref 36.0–46.0)
Hemoglobin: 7.8 g/dL — ABNORMAL LOW (ref 12.0–15.0)
Immature Granulocytes: 1 %
Lymphocytes Relative: 16 %
Lymphs Abs: 1.4 10*3/uL (ref 0.7–4.0)
MCH: 29 pg (ref 26.0–34.0)
MCHC: 30.1 g/dL (ref 30.0–36.0)
MCV: 96.3 fL (ref 80.0–100.0)
Monocytes Absolute: 1.1 10*3/uL — ABNORMAL HIGH (ref 0.1–1.0)
Monocytes Relative: 12 %
Neutro Abs: 6 10*3/uL (ref 1.7–7.7)
Neutrophils Relative %: 67 %
Platelets: 537 10*3/uL — ABNORMAL HIGH (ref 150–400)
RBC: 2.69 MIL/uL — ABNORMAL LOW (ref 3.87–5.11)
RDW: 14.9 % (ref 11.5–15.5)
WBC: 8.8 10*3/uL (ref 4.0–10.5)
nRBC: 0 % (ref 0.0–0.2)

## 2022-08-27 LAB — GLUCOSE, CAPILLARY
Glucose-Capillary: 293 mg/dL — ABNORMAL HIGH (ref 70–99)
Glucose-Capillary: 239 mg/dL — ABNORMAL HIGH (ref 70–99)
Glucose-Capillary: 317 mg/dL — ABNORMAL HIGH (ref 70–99)
Glucose-Capillary: 96 mg/dL (ref 70–99)
Glucose-Capillary: 188 mg/dL — ABNORMAL HIGH (ref 70–99)

## 2022-08-27 NOTE — Progress Notes (Signed)
  Progress Note    08/27/2022 6:47 AM   Subjective:  no complaints  afebrile  Vitals:   08/27/22 0433 08/27/22 0500  BP: (!) 104/41   Pulse: 67 84  Resp: 16 15  Temp: 97.8 F (36.6 C)   SpO2: 97% 99%    Physical Exam: General:  no distress Lungs:  non labored Incisions:  left AKA stump and right fasciotomy sites clean with staples in tact.  Right groin   Left groin   Extremities:  right foot is warm and well perfused. Abdomen:  soft  CBC    Component Value Date/Time   WBC 8.8 08/27/2022 0102   RBC 2.69 (L) 08/27/2022 0102   HGB 7.8 (L) 08/27/2022 0102   HGB 14.3 06/20/2012 2127   HCT 25.9 (L) 08/27/2022 0102   HCT 42.9 06/20/2012 2127   PLT 537 (H) 08/27/2022 0102   PLT 357 06/20/2012 2127   MCV 96.3 08/27/2022 0102   MCV 91 06/20/2012 2127   MCH 29.0 08/27/2022 0102   MCHC 30.1 08/27/2022 0102   RDW 14.9 08/27/2022 0102   RDW 13.6 06/20/2012 2127   LYMPHSABS 1.4 08/27/2022 0102   MONOABS 1.1 (H) 08/27/2022 0102   EOSABS 0.2 08/27/2022 0102   BASOSABS 0.1 08/27/2022 0102    BMET    Component Value Date/Time   NA 138 08/23/2022 0700   NA 135 (L) 06/20/2012 2127   K 4.4 08/23/2022 0700   K 4.0 06/20/2012 2127   CL 97 (L) 08/23/2022 0700   CL 100 06/20/2012 2127   CO2 31 08/23/2022 0700   CO2 27 06/20/2012 2127   GLUCOSE 258 (H) 08/23/2022 0700   GLUCOSE 250 (H) 06/20/2012 2127   BUN 32 (H) 08/23/2022 0700   BUN 20 (H) 06/20/2012 2127   CREATININE 0.74 08/23/2022 0700   CREATININE 0.60 06/20/2012 2127   CALCIUM 8.8 (L) 08/23/2022 0700   CALCIUM 8.8 06/20/2012 2127   GFRNONAA >60 08/23/2022 0700   GFRNONAA >60 06/20/2012 2127   GFRAA >60 06/20/2012 2127    INR    Component Value Date/Time   INR 1.0 08/03/2022 2156     Intake/Output Summary (Last 24 hours) at 08/27/2022 7741 Last data filed at 08/27/2022 2878 Gross per 24 hour  Intake 520 ml  Output 2225 ml  Net -1705 ml      Assessment/Plan:  72 y.o. female is s/p:  Left AKA  and excisional debridement of bilateral groin wounds with vac placement  13 Days Post-Op And Bilateral CFA endarterectomy with bovine patch angioplasty, bilateral iliofemoral embolectomy, stenting bilateral CIA, stent right EIA and 4 compartment fasciotomies   22 Days Post-Op   -right groin clean with minimal fibrinous exudate around the inferior portion of wound.  Left with beefy red granulation tissue around the superior edges with more fibrinous exudate centrally.  No evidence of infection and patch angioplasty with coverage bilaterally as it is not seen.  Wet to dry dressing replaced in groins and WOC will replace vacs later today. -continue plavix/statin -DVT prophylaxis:  sq heparin -Ancef q8h and Flagyl bid for E coli and Proteus mirabilis on wound cx    Leontine Locket, PA-C [facility] [phone] 08/27/2022 6:47 AM  I have independently interviewed and examined patient and agree with PA assessment and plan above.   Brandon C. Donzetta Matters, MD [facility] [place] Office: [phone] Pager: [phone]

## 2022-08-27 NOTE — Progress Notes (Signed)
This nurse call to room for pt SOB.  Pt noted with vomitus on mouth, neck & gown.  Pt reports she choked.  O2 applied @ 8L.  Duoneb given and RT notified and reported to bedside. Currently pt reports feeling better, in no distress.   O2 sats 96 % @ 2 L.  MD paged.

## 2022-08-27 NOTE — Progress Notes (Signed)
PROGRESS NOTE  Heather [name]  DOB: 13-Jul-1951  PCP: [name], [place] GQQ:[id number]  DOA: 08/04/2022  LOS: 22 days  Hospital Day: 24  Brief narrative: Heather [name] is a 72 y.o. female with PMH significant for DM2, neuropathy, HTN, HLD, COPD, GERD, PAD, AS s/p TAVR, chronic diastolic CHF, anxiety/depression/schizophrenia, tobacco abuse. 12/11, patient presented to ED with sudden onset of dizziness, blurred vision, headache, nausea and vomiting.  Blood pressure was significantly elevated to over 671 systolic with EMS. She was admitted to ICU for hemorrhagic stroke and bilateral lower extremity acute on chronic ischemia.   Vascular surgery was consulted and s/p bilateral lower extremity femoral endarterectomy and patch angioplasty with iliac stenting, 4 compartment fasciotomies for acute on chronic lower extremity ischemia.  Postop remained on vent and Cleviprex.   Transferred to floor and TRH on 12/17.   Neurology and vascular surgery following.    Subjective: Patient was seen and examined this morning.  Lying on bed.  Not in distress no new symptoms. Has core track feeding for 16 hours a day  Assessment and plan: Bilateral lower extremity acute on chronic ischemia Rhabdomyolysis S/p left AKA -12/21  S/p debridement of groin wounds and VAC placement 12/21 s/p bilateral lower extremity femoral endarterectomy and patch angioplasty with iliac stenting, 4 compartment fasciotomies.  CK level was elevated to thousands.  Gradually downtrended. However LLE ischemia did not improve, patient started having rhabdomyolysis. Patient ultimately required left AKA on 12/24.  Wound culture grew E. coli and Proteus Mirabilis. Currently on IV Ancef and IV Flagyl.  Require for 4 to 6 weeks per vascular surgery. Continue wound VAC changes 3 times a week per vascular surgery.  Noted to plan to change next on 1/3 Initially started on heparin drip. Currently on Plavix 75 Mg daily.    Acute intracranial  hemorrhage  Initial CT scan showed acute intraparenchymal hematoma within the cerebellar vermis measuring 11 cc in volume and mild mass effect upon the fourth ventricle. Confirmed with an MRI. Neurology was consulted. Eventually patient was started on antiplatelet agents.  Currently on Plavix 75 mg daily.    Acute respiratory failure with hypoxia COPD, everyday smoker Extubated 12/14. Currently on low-flow oxygen. Continue Brovana twice daily, Pulmicort twice daily, Yupelri daily, albuterol PRN  Uncontrolled type 2 diabetes mellitus A1c 12.9 on 08/05/2022 Blood sugar control is brittle.  Running elevated over 200 for the last several readings since last night.  For now, I will continue Semglee 30 units, reduced dose of Premeal scheduled insulin 2 units TID.  Continue sliding scale insulin with Accu-Cheks.  Continue to monitor Recent Labs  Lab 08/26/22 1642 08/26/22 2031 08/26/22 2348 08/27/22 0438 08/27/22 0803  GLUCAP 154* 222* 263* 293* 239*    Chronic diastolic CHF HTN  Received intermittent dialysis while in ICU.   Currently blood pressure controlled on Coreg 12.5 mg BID.  Hydralazine as needed. Echo 12/11 with EF 60 to 65%, no WMA, indeterminate diastolic parameters.   Hx of CAD, HLD AS s/p TAVR. On Plavix 75 mg daily, Lipitor 80 mg daily.  Acute anemia Received 3 units of PRBCs this hospitalization.  Hemoglobin slightly downtrending but remains close to 8 Recent Labs    08/24/22 0500 08/25/22 0640 08/26/22 0420 08/26/22 0555 08/27/22 0102  HGB 8.3* 8.0* QUESTIONABLE RESULTS, RECOMMEND RECOLLECT TO VERIFY 8.4* 7.8*  MCV 94.4 94.4 QUESTIONABLE RESULTS, RECOMMEND RECOLLECT TO VERIFY 96.1 96.3      Hx of depression, bipolar, schizophrenia, insomnia. Currently on Lexapro daily, Remeron nightly,  trazodone nightly.,    Dysphagia Likely related to hemorrhagic stroke Has left nasal core track with ongoing tube feeds. Speech therapy following.  Dysphagia diet  currently. After my conversation with family 1/1, patient and family wanted to try regular diet and hence I put her on regular diet. Patient and family want to hold off on PEG tube insertion at this time.  Diarrhea 12/27, patient had significant loose bowel movement.  Noticed that she was not scheduled bowel regimen.  I stopped it.  Diarrhea improved.  Goals of care   Code Status: Full Code    Mobility: Encourage ambulation  Scheduled Meds:  arformoterol  15 mcg Nebulization BID   atorvastatin  80 mg Per Tube Daily   budesonide (PULMICORT) nebulizer solution  0.5 mg Nebulization BID   carvedilol  12.5 mg Per Tube BID WC   Chlorhexidine Gluconate Cloth  6 each Topical Q0600   clopidogrel  75 mg Oral Daily   escitalopram  10 mg Per Tube Daily   feeding supplement  237 mL Oral TID WC   feeding supplement (PIVOT 1.5 CAL)  1,000 mL Per Tube Q24H   feeding supplement (PROSource TF20)  60 mL Per Tube Daily   Gerhardt's butt cream   Topical BID   heparin injection (subcutaneous)  5,000 Units Subcutaneous Q8H   insulin aspart  0-15 Units Subcutaneous Q4H   insulin aspart  2 Units Subcutaneous TID WC   insulin glargine-yfgn  30 Units Subcutaneous Daily   metroNIDAZOLE  500 mg Per Tube Q12H   mirtazapine  7.5 mg Per Tube QHS   multivitamin with minerals  1 tablet Per Tube Daily   nicotine  14 mg Transdermal Daily   mouth rinse  15 mL Mouth Rinse Q4H   oxyCODONE  10 mg Oral Q12H   pantoprazole  40 mg Oral Daily   revefenacin  175 mcg Nebulization Daily   sodium chloride flush  10-40 mL Intracatheter Q12H    PRN meds: acetaminophen **OR** acetaminophen (TYLENOL) oral liquid 160 mg/5 mL **OR** acetaminophen, albuterol, hydrALAZINE, HYDROmorphone (DILAUDID) injection, ondansetron (ZOFRAN) IV, mouth rinse, oxyCODONE, sodium chloride flush, traZODone   Infusions:    ceFAZolin (ANCEF) IV 2 g (08/27/22 0400)    Skin assessment:  Pressure Injury 08/04/22 Sacrum Stage 2 -  Partial  thickness loss of dermis presenting as a shallow open injury with a red, pink wound bed without slough. (Active)  08/04/22 0100  Location: Sacrum  Location Orientation:   Staging: Stage 2 -  Partial thickness loss of dermis presenting as a shallow open injury with a red, pink wound bed without slough.  Wound Description (Comments):   Present on Admission: Yes    Nutritional status:  Body mass index is 28.51 kg/m.  Nutrition Problem: Increased nutrient needs Etiology: wound healing Signs/Symptoms: estimated needs     Diet:  Diet Order             DIET DYS 3 Room service appropriate? No; Fluid consistency: Thin  Diet effective now                   DVT prophylaxis:  heparin injection 5,000 Units Start: 08/10/22 1600   Antimicrobials: Ancef, Flagyl Fluid: None Consultants: Vascular surgery Family Communication: Last discussed with patient's daughters on 1/2. Status is: Inpatient  Continue in-hospital care because: Pending clinical improvement.  Patient may be a candidate for LTAC given her wound and core track requirement. Level of care: Telemetry Medical   Dispo: The patient  is from: Home              Anticipated d/c is to: SNF versus LTAC              Patient currently is not medically stable to d/c.   Difficult to place patient No    Antimicrobials: Anti-infectives (From admission, onward)    Start     Dose/Rate Route Frequency Ordered Stop   08/20/22 2200  metroNIDAZOLE (FLAGYL) IVPB 500 mg  Status:  Discontinued        500 mg 100 mL/hr over 60 Minutes Intravenous Every 12 hours 08/20/22 1110 08/20/22 1111   08/20/22 2200  metroNIDAZOLE (FLAGYL) tablet 500 mg        500 mg Per Tube Every 12 hours 08/20/22 1111     08/18/22 2300  ceFAZolin (ANCEF) IVPB 2g/100 mL premix        2 g 200 mL/hr over 30 Minutes Intravenous Every 8 hours 08/18/22 1455     08/18/22 2300  metroNIDAZOLE (FLAGYL) IVPB 500 mg  Status:  Discontinued        500 mg 100 mL/hr over 60  Minutes Intravenous Every 6 hours 08/18/22 1455 08/20/22 1110   08/15/22 1100  vancomycin (VANCOCIN) IVPB 1000 mg/200 mL premix  Status:  Discontinued        1,000 mg 200 mL/hr over 60 Minutes Intravenous Every 24 hours 08/14/22 1006 08/15/22 1307   08/14/22 1400  piperacillin-tazobactam (ZOSYN) IVPB 3.375 g  Status:  Discontinued        3.375 g 12.5 mL/hr over 240 Minutes Intravenous Every 8 hours 08/14/22 1006 08/18/22 1455   08/14/22 1100  vancomycin (VANCOREADY) IVPB 1500 mg/300 mL        1,500 mg 150 mL/hr over 120 Minutes Intravenous  Once 08/14/22 1006 08/14/22 1502   08/04/22 1045  ceFAZolin (ANCEF) IVPB 2g/100 mL premix        2 g 200 mL/hr over 30 Minutes Intravenous On call to O.R. 08/04/22 0958 08/04/22 1100       Objective: Vitals:   08/27/22 0500 08/27/22 0807  BP:  (!) 113/43  Pulse: 84 77  Resp: 15 15  Temp:  (!) 97.4 F (36.3 C)  SpO2: 99% 94%    Intake/Output Summary (Last 24 hours) at 08/27/2022 1024 Last data filed at 08/27/2022 0513 Gross per 24 hour  Intake 270 ml  Output 1375 ml  Net -1105 ml    Filed Weights   08/19/22 0420 08/20/22 0500 08/27/22 0500  Weight: 66.2 kg 69.3 kg 66.2 kg   Weight change:  Body mass index is 28.51 kg/m.   Physical Exam: General exam: Pleasant, elderly Caucasian female.  Pain controlled.  Cortrak feeding ongoing. Skin: No rashes, lesions or ulcers. HEENT: Atraumatic, normocephalic, no obvious bleeding. Lungs: Clear to auscultation bilaterally CVS: Regular rate and rhythm, no murmur GI/Abd soft, nondistended, nontender, bowel sound present CNS: Alert, awake, oriented x 3. Psychiatry: Mood appropriate Extremities: No pedal edema, no calf tenderness.  Left AKA stump clean.  Wound VAC in the groin  Data Review: I have personally reviewed the laboratory data and studies available.  F/u labs ordered Unresulted Labs (From admission, onward)     Start     Ordered   08/23/22 0500  CBC with Differential/Platelet   Tomorrow morning,   R       Question:  Specimen collection method  Answer:  Unit=Unit collect   08/22/22 0801   08/18/22 0500  CBC with Differential/Platelet  Daily,   R     Question:  Specimen collection method  Answer:  Lab=Lab collect   08/17/22 1947            Total time spent in review of labs and imaging, patient evaluation, formulation of plan, documentation and communication with family 35 minutes  Signed, Terrilee Croak, MD [facility] 08/27/2022

## 2022-08-27 NOTE — Consult Note (Signed)
[place] Nurse wound follow up Wound type: bilateral inguinal surgical wounds. NPWT removed by vascular team for assessment this AM.  NS moist gauze dressing in place Measurement: LEft inguinal 5 cm x 5.5 cm x 3 cm  Right 7.4 cm x 4 cm x 3 cm  Wound bed: beefy red with white fibrin noted Drainage (amount, consistency, odor) moderate lymphatic drainage  Canister is emptied today Periwound: intact  due to skin fold, barrier ring placed to promote seal.  Blistering to periwound resolving Dressing procedure/placement/frequency: 1 piece white and 1 piece black foam in each wound bed. Drape and TRAC pad applied and Y connector joined both sets of wound tubing.  Seal achieved at 125 mmHg.  Change Mon.WEd.Fri.  Will follow.  Estrellita Ludwig MSN, RN, FNP-BC CWON Wound, Ostomy, Continence Nurse [facility] [phone] Pager [phone]

## 2022-08-27 NOTE — Inpatient Diabetes Management (Signed)
Inpatient Diabetes Program Recommendations  AACE/ADA: New Consensus Statement on Inpatient Glycemic Control (2015)  Target Ranges:  Prepandial:   less than 140 mg/dL      Peak postprandial:   less than 180 mg/dL (1-2 hours)      Critically ill patients:  140 - 180 mg/dL   Lab Results  Component Value Date   GLUCAP 317 (H) 08/27/2022   HGBA1C 12.9 (H) 08/05/2022    Review of Glycemic Control  Latest Reference Range & Units 08/26/22 07:57 08/26/22 12:30 08/26/22 16:42 08/26/22 20:31 08/26/22 23:48 08/27/22 04:38 08/27/22 08:03 08/27/22 11:46  Glucose-Capillary 70 - 99 mg/dL 254 (H) 190 (H) 154 (H) 222 (H) 263 (H) 293 (H) 239 (H) 317 (H)  (H): Data is abnormally high  Diabetes history: DM2 Outpatient Diabetes medications: Levemir 18 units QD, Metformin 750 mg QD, Novolog 5 units TID Current orders for Inpatient glycemic control: Semglee 30 units QD, Novolog 0-15 units Q4H, Novolog 2 units TID (decreased today)  Inpatient Diabetes Program Recommendations:    Please consider adding tube feed coverage:   Novolog 3 units Q4H from 18:00-0800-(hold if feeds ar held or discontinued).    Noted MD advanced her diet-unsure if she is eating well?  Getting Novolog 2 units TID with meals, postprandial was 317 mg/dl at noon.  May need to increase this coverage if eating well and postprandials remain elevated.   Will continue to follow while inpatient.  Thank you, Reche Dixon, MSN, [place] Diabetes Coordinator Inpatient Diabetes Program [phone] (team pager from 8a-5p)

## 2022-08-27 NOTE — Progress Notes (Signed)
RT was called by RN due to Pt possibly aspirated. RN gave supplemental oxygen and nebulizer treatment. Pt sounds clear and vitals are stable at this time on 2L [place]. Pt states she is in no distress at the time. RT will monitor as needed.

## 2022-08-28 ENCOUNTER — Inpatient Hospital Stay (HOSPITAL_COMMUNITY): Payer: Medicare HMO

## 2022-08-28 DIAGNOSIS — K612 Anorectal abscess: Secondary | ICD-10-CM | POA: Diagnosis not present

## 2022-08-28 DIAGNOSIS — L89152 Pressure ulcer of sacral region, stage 2: Secondary | ICD-10-CM | POA: Diagnosis not present

## 2022-08-28 LAB — GLUCOSE, CAPILLARY
Glucose-Capillary: 182 mg/dL — ABNORMAL HIGH (ref 70–99)
Glucose-Capillary: 186 mg/dL — ABNORMAL HIGH (ref 70–99)
Glucose-Capillary: 205 mg/dL — ABNORMAL HIGH (ref 70–99)
Glucose-Capillary: 208 mg/dL — ABNORMAL HIGH (ref 70–99)
Glucose-Capillary: 211 mg/dL — ABNORMAL HIGH (ref 70–99)
Glucose-Capillary: 219 mg/dL — ABNORMAL HIGH (ref 70–99)
Glucose-Capillary: 271 mg/dL — ABNORMAL HIGH (ref 70–99)

## 2022-08-28 LAB — COMPREHENSIVE METABOLIC PANEL
ALT: 6 U/L (ref 0–44)
AST: 19 U/L (ref 15–41)
Albumin: 2.1 g/dL — ABNORMAL LOW (ref 3.5–5.0)
Alkaline Phosphatase: 101 U/L (ref 38–126)
Anion gap: 8 (ref 5–15)
BUN: 36 mg/dL — ABNORMAL HIGH (ref 8–23)
CO2: 30 mmol/L (ref 22–32)
Calcium: 8.9 mg/dL (ref 8.9–10.3)
Chloride: 98 mmol/L (ref 98–111)
Creatinine, Ser: 0.71 mg/dL (ref 0.44–1.00)
GFR, Estimated: >60 mL/min (ref >60)
Glucose, Bld: 232 mg/dL — ABNORMAL HIGH (ref 70–99)
Potassium: 4.4 mmol/L (ref 3.5–5.1)
Sodium: 136 mmol/L (ref 135–145)
Total Bilirubin: 0.3 mg/dL (ref 0.3–1.2)
Total Protein: 6.2 g/dL — ABNORMAL LOW (ref 6.5–8.1)

## 2022-08-28 LAB — CBC WITH DIFFERENTIAL/PLATELET
Abs Immature Granulocytes: 0.07 10*3/uL (ref 0.00–0.07)
Basophils Absolute: 0.1 10*3/uL (ref 0.0–0.1)
Basophils Relative: 1 %
Eosinophils Absolute: 0.3 10*3/uL (ref 0.0–0.5)
Eosinophils Relative: 3 %
HCT: 31.1 % — ABNORMAL LOW (ref 36.0–46.0)
Hemoglobin: 9.4 g/dL — ABNORMAL LOW (ref 12.0–15.0)
Immature Granulocytes: 1 %
Lymphocytes Relative: 13 %
Lymphs Abs: 1.4 10*3/uL (ref 0.7–4.0)
MCH: 29.2 pg (ref 26.0–34.0)
MCHC: 30.2 g/dL (ref 30.0–36.0)
MCV: 96.6 fL (ref 80.0–100.0)
Monocytes Absolute: 1 10*3/uL (ref 0.1–1.0)
Monocytes Relative: 10 %
Neutro Abs: 8 10*3/uL — ABNORMAL HIGH (ref 1.7–7.7)
Neutrophils Relative %: 72 %
Platelets: 631 10*3/uL — ABNORMAL HIGH (ref 150–400)
RBC: 3.22 MIL/uL — ABNORMAL LOW (ref 3.87–5.11)
RDW: 14.9 % (ref 11.5–15.5)
WBC: 10.9 10*3/uL — ABNORMAL HIGH (ref 4.0–10.5)
nRBC: 0 % (ref 0.0–0.2)

## 2022-08-28 MED ORDER — SENNOSIDES-DOCUSATE SODIUM 8.6-50 MG PO TABS
1.0000 | ORAL_TABLET | Freq: Every day | ORAL | Status: DC
  Administered 2022-08-28 – 2022-09-21 (×24): 1 via ORAL
  Filled 2022-08-28 (×25): qty 1

## 2022-08-28 MED ORDER — ALBUTEROL SULFATE (2.5 MG/3ML) 0.083% IN NEBU
2.5000 mg | INHALATION_SOLUTION | Freq: Once | RESPIRATORY_TRACT | Status: AC | PRN
  Administered 2022-08-28: 2.5 mg via RESPIRATORY_TRACT
  Filled 2022-08-28: qty 3

## 2022-08-28 MED ORDER — FUROSEMIDE 10 MG/ML IJ SOLN
40.0000 mg | Freq: Once | INTRAMUSCULAR | Status: AC
  Administered 2022-08-28: 40 mg via INTRAVENOUS
  Filled 2022-08-28: qty 4

## 2022-08-28 MED ORDER — POLYETHYLENE GLYCOL 3350 17 G PO PACK
17.0000 g | PACK | Freq: Every day | ORAL | Status: DC | PRN
  Administered 2022-09-02 – 2022-09-21 (×4): 17 g via ORAL
  Filled 2022-08-28 (×5): qty 1

## 2022-08-28 NOTE — Progress Notes (Signed)
Refused bipap.

## 2022-08-28 NOTE — Progress Notes (Signed)
   08/28/22 0300  Vitals  BP (!) 183/55  MAP (mmHg) 90  BP Location Left Arm  BP Method Automatic  Patient Position (if appropriate) Lying  Pulse Rate 99  Pulse Rate Source Monitor  ECG Heart Rate 99  Resp (!) 28, labored breathing, diaphoresis,   Level of Consciousness  Level of Consciousness Alert, fully oriented x 4, anxious.   MEWS COLOR  MEWS Score Color Yellow  Oxygen Therapy  SpO2 (!) 84 %  O2 Device Nasal Cannula  O2 Flow Rate (L/min) 2 L/min  MEWS Score  MEWS Temp 0  MEWS Systolic 0  MEWS Pulse 0  MEWS RR 2  MEWS LOC 0  MEWS Score 2  Provider Notification  Provider Name/Title Dr. Bridgett Larsson  Date Provider Notified 08/28/22  Time Provider Notified 0300  Method of Notification Call  Notification Reason Change in status  Provider response Evaluate remotely;See new orders  Date of Provider Response 08/28/22  Time of Provider Response 0300  Rapid Response Notification  Name of Rapid Response RN Notified Mindy, RRT  Date Rapid Response Notified 08/28/22  Time Rapid Response Notified 0300   We considered to hold tube feed due to respiratory distress. Diminished  lung sound bilaterally on auscultation. RRT Mindy and Bonnita Nasuti RT and Dr. Bridgett Larsson notified.    Kennyth Lose, RN

## 2022-08-28 NOTE — Progress Notes (Signed)
Physical Therapy Treatment Patient Details Name: Heather [name] MRN: [id number] DOB: [birth year] Today's Date: 08/28/2022   History of Present Illness Pt is a 72 y.o. female admitted 08/04/22 with headache, dizziness. 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  Found to have mottled LE's after given Narcdipine due to HTN; s/p emergent common femoral and iliofemoral endarterectomy and bilateral 4 compartment fasciotomies on 12/11. 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 PMH includes smoker(2ppd), severe AI/AS s/p TAVR, CAD s/p PCI and on DAPT, COPD, PAD, HTN.   PT Comments    Pt making incremental progress with mobility. Today's session focused on out of bed transfer to recliner via maximove lift, pt tolerated well. Pt demonstrates improving attention and command following, though still with decreased awareness and poor problem solving. Pt remains limited by generalized weakness, decreased activity tolerance, and impaired balance strategies/postural reactions. Continue to recommend SNF-level therapies to maximize functional mobility prior to return home; noted potential for LTACH per CM note.    Recommendations for follow up therapy are one component of a multi-disciplinary discharge planning process, led by the attending physician.  Recommendations may be updated based on patient status, additional functional criteria and insurance authorization.  Follow Up Recommendations  Skilled nursing-short term rehab (<3 hours/day) Can patient physically be transported by private vehicle: No   Assistance Recommended at Discharge Frequent or constant Supervision/Assistance  Patient can return home with the following Two people to help with walking and/or transfers;Two people to help with bathing/dressing/bathroom;Help with stairs or ramp for entrance;Assist for transportation;Assistance with cooking/housework   Equipment Recommendations  Other (defer to next venue)     Recommendations for Other Services       Precautions / Restrictions Precautions Precautions: Fall;Other (comment) Precaution Comments: bilateral groin wound vac; watch SpO2; bowel incontinence     Mobility  Bed Mobility Overal bed mobility: Needs Assistance Bed Mobility: Rolling Rolling: Min assist         General bed mobility comments: minA to roll R/L for maximove pad placement and pericare    Transfers Overall transfer level: Needs assistance   Transfers: Bed to chair/wheelchair/BSC             General transfer comment: maximove lift from bed to recliner with assist+2, pt tolerated fairly well though c/o LE pain Transfer via Lift Equipment: Maximove  Ambulation/Gait                   Stairs             Wheelchair Mobility    Modified Rankin (Stroke Patients Only)       Balance Overall balance assessment: Needs assistance Sitting-balance support: Bilateral upper extremity supported, Feet unsupported Sitting balance-Leahy Scale: Poor Sitting balance - Comments: pt reliant on UE support to weight shift anteriorly to edge of recliner                                    Cognition Arousal/Alertness: Awake/alert Behavior During Therapy: Flat affect Overall Cognitive Status: Impaired/Different from baseline Area of Impairment: Attention, Following commands, Safety/judgement, Awareness, Problem solving                   Current Attention Level: Selective   Following Commands: Follows one step commands consistently Safety/Judgement: Decreased awareness of deficits Awareness: Emergent Problem Solving: Requires verbal cues, Slow processing General Comments: improving cognition  and affect; decreased awareness of deficits, but willing to try maximove for OOB today and motivated to get up        Exercises      General Comments General comments (skin integrity, edema, etc.): pt happy to be sitting in recliner to eat  meal. slight bowel incontinence noted in bed before transfer, dependent for pericare; RN aware of buttocks wounds. SpO2 down to 86% on RA, replaced 2-3L O2 [place]      Pertinent Vitals/Pain Pain Assessment Pain Assessment: Faces Faces Pain Scale: Hurts little more Pain Location: buttocks, L residual limb Pain Descriptors / Indicators: Grimacing, Guarding, Discomfort Pain Intervention(s): Monitored during session, Limited activity within patient's tolerance    Home Living                          Prior Function            PT Goals (current goals can now be found in the care plan section) Progress towards PT goals: Progressing toward goals    Frequency    Min 2X/week      PT Plan Current plan remains appropriate    Co-evaluation              AM-PAC PT "6 Clicks" Mobility   Outcome Measure  Help needed turning from your back to your side while in a flat bed without using bedrails?: A Little Help needed moving from lying on your back to sitting on the side of a flat bed without using bedrails?: A Lot Help needed moving to and from a bed to a chair (including a wheelchair)?: Total Help needed standing up from a chair using your arms (e.g., wheelchair or bedside chair)?: Total Help needed to walk in hospital room?: Total Help needed climbing 3-5 steps with a railing? : Total 6 Click Score: 9    End of Session   Activity Tolerance: Patient tolerated treatment well Patient left: in chair;with call bell/phone within reach Nurse Communication: Mobility status;Need for lift equipment PT Visit Diagnosis: Muscle weakness (generalized) (M62.81);Pain;Other abnormalities of gait and mobility (R26.89)     Time: 5093-2671 PT Time Calculation (min) (ACUTE ONLY): 22 min  Charges:  $Therapeutic Activity: 8-22 mins                     Mabeline Caras, PT, DPT Acute Rehabilitation Services  Personal: [place] [facility] Office: [place] 08/28/2022,  5:02 PM

## 2022-08-28 NOTE — TOC Progression Note (Signed)
Transition of Care (TOC) - Progression Note  Marvetta Gibbons RN, BSN Transitions of Care Unit 4E- RN Case Manager See Treatment Team for direct phone #   Patient Details  Name: Heather [name] MRN: [id number] Date of Birth: [birth year]  Transition [facility]) CM/SW Contact  Dahlia Client, Romeo Rabon, RN Phone Number: 08/28/2022, 3:37 PM  Clinical Narrative:    [facility] referral received regarding LTACH option for transition, CM reached out to both [facility] and [facility] to discuss- they both do not have any bed availability this week however feel pt could be a potential LTACH candidate and willing to see if insurance would approve if family agreeable.   Call made to pt's spouse- [name]- to discuss LTACH- per TC conversation - choice given for both [facility] and [facility] and explained that both facilities could continue current level of medical need including wound care, feeding tube and IV abx. Spouse voiced he would like to think about it- CM to f/u.   1540- received msg from MD that he had spoken w/ daughter [name] regarding LTACH and request that Cm speak with her. Call made to [name] who voiced that Paulo Fruit has dementia and his their stepdad, Explained LTACH and choice offered between [facility] and [facility]- she and her sister who was also on call voiced that they are agreeable to [facility] referral- they prefer [facility] referral as first choice but are agreeable to use [facility] as back up should [facility] not have any bed availability. Explained that LTACH placement was dependent on insurance approval and bed availability- both voiced understanding.  Questions also answered regarding PC consult for [place] as MD spoke with [name] about PC as an option should LTACH not be approved.    Referral made to [facility] liaison for [facility] as per family choice.    Expected Discharge Plan: Skilled Nursing Facility Barriers to Discharge: [place] (PASRR), Insurance Authorization, Continued Medical Work up, SNF  Pending bed offer (cortrak)  Expected Discharge Plan and Services In-house Referral: Clinical Social Work   Post Acute Care Choice: Long Term Acute Care (LTAC) Living arrangements for the past 2 months: [place] Determinants of Health (SDOH) Interventions [place]: Food Insecurity Present (08/11/2022)  Housing: Low Risk  (08/11/2022)  Transportation Needs: No Transportation Needs (08/11/2022)  Utilities: Not At Risk (08/11/2022)  Financial Resource Strain: Medium Risk (10/19/2018)  Physical Activity: Inactive (10/19/2018)  Social Connections: Unknown (10/19/2018)  Tobacco Use: High Risk (08/15/2022)    Readmission Risk Interventions    11/13/2020    2:27 PM  Readmission Risk Prevention Plan  Transportation Screening Complete  PCP or Specialist Appt within 3-5 Days Complete  HRI or [place] Complete  Social Work Consult for [place] Planning/Counseling Complete  Palliative Care Screening Not Applicable  Medication Review Press [employer]) Complete

## 2022-08-28 NOTE — Progress Notes (Signed)
Physical Therapy Treatment Patient Details Name: Heather [name] MRN: [id number] DOB: 1950-10-09 Today's Date: 08/28/2022   History of Present Illness Pt is a 72 y.o. female admitted 08/04/22 with headache, dizziness. 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  Found to have mottled LE's after given Narcdipine due to HTN; s/p emergent common femoral and iliofemoral endarterectomy and bilateral 4 compartment fasciotomies on 12/11. 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 PMH includes smoker(2ppd), severe AI/AS s/p TAVR, CAD s/p PCI and on DAPT, COPD, PAD, HTN.   PT Comments    Pt seen for additional session, trialled transfer to [facility] via maximove lift before return to bed. Pt with fatigue and buttocks pain, but ultimately tolerating lifting well; pt reports happy to be up to recliner to eat meal. Pt remains limited by generalized weakness, decreased activity tolerance, poor balance strategies/postural reactions and impaired cognition. Will continue to follow acutely to address established goals.     Recommendations for follow up therapy are one component of a multi-disciplinary discharge planning process, led by the attending physician.  Recommendations may be updated based on patient status, additional functional criteria and insurance authorization.  Follow Up Recommendations  Skilled nursing-short term rehab (<3 hours/day) Can patient physically be transported by private vehicle: No   Assistance Recommended at Discharge Frequent or constant Supervision/Assistance  Patient can return home with the following Two people to help with walking and/or transfers;Two people to help with bathing/dressing/bathroom;Help with stairs or ramp for entrance;Assist for transportation;Assistance with cooking/housework   Equipment Recommendations  Other (defer to next venue)    Recommendations for Other Services       Precautions / Restrictions  Precautions Precautions: Fall;Other (comment) Precaution Comments: bilateral groin wound vac; watch SpO2; bowel incontinence; multiple buttocks wounds     Mobility  Bed Mobility Overal bed mobility: Needs Assistance Bed Mobility: Rolling Rolling: Min assist, Mod assist         General bed mobility comments: min-modA to roll R/L for pericare and maximove pad removal; assist +2 to scoot up in bed, assist +1 to reposition hips; pt able to assist well with UE support on rails    Transfers Overall transfer level: Needs assistance   Transfers: Bed to chair/wheelchair/BSC             General transfer comment: maximove lift from recliner to [facility], then BSC to bed; pt tolerated fairly well though c/o buttocks pain Transfer via Lift Equipment: Maximove  Ambulation/Gait                   Stairs             Wheelchair Mobility    Modified Rankin (Stroke Patients Only)       Balance Overall balance assessment: Needs assistance Sitting-balance support: Bilateral upper extremity supported, Feet unsupported Sitting balance-Leahy Scale: Poor Sitting balance - Comments: assist to lean R/L while seated on BSC to remove chuck pad from underneath buttocks                                    Cognition Arousal/Alertness: Awake/alert Behavior During Therapy: Flat affect Overall Cognitive Status: No family/caregiver present to determine baseline cognitive functioning Area of Impairment: Attention, Following commands, Safety/judgement, Awareness, Problem solving                   Current Attention Level:  Selective Memory: Decreased short-term memory Following Commands: Follows one step commands consistently Safety/Judgement: Decreased awareness of deficits Awareness: Emergent Problem Solving: Requires verbal cues, Slow processing General Comments: focused on buttocks pain with mobility, but ultimately seems appreciative of sitting up in recliner to  eat dinner        Exercises      General Comments General comments (skin integrity, edema, etc.): RN notes this is most pt has eaten, which pt attributes to sitting up. pt able to have small bowel movement seated on BSC. RN present to assist with mobility and address buttocks wounds      Pertinent Vitals/Pain Pain Assessment Pain Assessment: Faces Faces Pain Scale: Hurts whole lot Pain Location: buttocks Pain Descriptors / Indicators: Grimacing, Guarding, Discomfort Pain Intervention(s): Monitored during session, Repositioned    Home Living                          Prior Function            PT Goals (current goals can now be found in the care plan section) Progress towards PT goals: Progressing toward goals    Frequency    Min 2X/week      PT Plan Current plan remains appropriate    Co-evaluation              AM-PAC PT "6 Clicks" Mobility   Outcome Measure  Help needed turning from your back to your side while in a flat bed without using bedrails?: A Lot Help needed moving from lying on your back to sitting on the side of a flat bed without using bedrails?: A Lot Help needed moving to and from a bed to a chair (including a wheelchair)?: Total Help needed standing up from a chair using your arms (e.g., wheelchair or bedside chair)?: Total Help needed to walk in hospital room?: Total Help needed climbing 3-5 steps with a railing? : Total 6 Click Score: 8    End of Session Equipment Utilized During Treatment: Oxygen Activity Tolerance: Patient tolerated treatment well;Patient limited by fatigue Patient left: in bed;with call bell/phone within reach Nurse Communication: Mobility status;Need for lift equipment PT Visit Diagnosis: Muscle weakness (generalized) (M62.81);Pain;Other abnormalities of gait and mobility (R26.89)     Time: 7106-2694 PT Time Calculation (min) (ACUTE ONLY): 25 min  Charges:  $Therapeutic Activity: 23-37 mins                      Mabeline Caras, PT, DPT Acute Rehabilitation Services  Personal: [place] [facility] Office: [place] 08/28/2022, 5:20 PM

## 2022-08-28 NOTE — Progress Notes (Signed)
Pt removed from BIPAP and placed on 3L [place]. Pt is tolerating well at this time.

## 2022-08-28 NOTE — Progress Notes (Signed)
PROGRESS NOTE  Heather [name]  DOB: 30-Mar-1951  PCP: [name], [place] XBD:[id number]  DOA: 08/04/2022  LOS: 24 days  Hospital Day: 25  Brief narrative: Heather [name] is a 72 y.o. female with PMH significant for DM2, neuropathy, HTN, HLD, COPD, GERD, PAD, AS s/p TAVR, chronic diastolic CHF, anxiety/depression/schizophrenia, tobacco abuse. 12/11, patient presented to ED with sudden onset of dizziness, blurred vision, headache, nausea and vomiting.  Blood pressure was significantly elevated to over 834 systolic with EMS. She was admitted to ICU for hemorrhagic stroke and bilateral lower extremity acute on chronic ischemia.   Vascular surgery was consulted and s/p bilateral lower extremity femoral endarterectomy and patch angioplasty with iliac stenting, 4 compartment fasciotomies for acute on chronic lower extremity ischemia.  Postop remained on vent and Cleviprex.   Transferred to floor and TRH on 12/17.   Neurology and vascular surgery following.    Subjective: Patient was seen and examined this morning.  She was on BiPAP this morning. Event from last night noted.  She was hypoxic to 84% on 2 L oxygen nasal cannula.  Chest x-ray showed bilateral interstitial prominence, possibly edema.  She was given 1 dose of IV Lasix 40 mg She was placed on BiPAP with improvement in her breathing This morning, taken off BiPAP to 3 lpm O2 by [place].  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  CK level was elevated to thousands.  Gradually downtrended. However LLE ischemia did not improve, patient started having rhabdomyolysis. Patient ultimately required left AKA on 12/24.  Wound culture grew E. coli and Proteus Mirabilis. Currently on IV Ancef and IV Flagyl.   Require for 4 to 6 weeks per vascular surgery. Continue wound VAC changes 3 times a week per vascular surgery.  Noted to plan to change next on 1/3 Initially started on heparin drip. Currently on Plavix 75 Mg daily.    Acute intracranial hemorrhage  Initial CT scan showed acute intraparenchymal hematoma within the cerebellar vermis measuring 11 cc in volume and mild mass effect upon the fourth ventricle. Confirmed with an MRI. Neurology was consulted. Eventually patient was started on antiplatelet agents.  Currently on Plavix 75 mg daily.    Acute respiratory failure with hypoxia COPD, everyday smoker Extubated 12/14. Currently on low-flow oxygen. Continue Brovana twice daily, Pulmicort twice daily, Yupelri daily, albuterol PRN She was hypoxic to 84% on 2 L oxygen nasal cannula.  Chest x-ray showed bilateral interstitial prominence, possibly edema.  She was given 1 dose of IV Lasix 40 mg She was placed on BiPAP with improvement in her breathing This morning, taken off BiPAP to 3 lpm O2 by [place].  Chronic diastolic CHF HTN  Received intermittent dialysis while in ICU.   Currently blood pressure controlled on Coreg 12.5 mg BID.  Hydralazine as needed. Echo 12/11 with EF 60 to 65%, no WMA, indeterminate diastolic parameters.    Uncontrolled type 2 diabetes mellitus A1c 12.9 on 08/05/2022 Blood sugar control is brittle.  Running elevated over 200 for the last several readings since last night.  For now, I will continue Semglee 30 units, reduced dose of Premeal scheduled insulin 2 units TID.  Continue sliding scale insulin with Accu-Cheks.  Continue to monitor Recent Labs  Lab 08/28/22 0028 08/28/22 0311 08/28/22 0553 08/28/22 0741 08/28/22 1110  GLUCAP 205*  219* 208* 182* 186*   Hx of CAD, HLD AS s/p TAVR. On Plavix 75 mg daily, Lipitor 80 mg daily.  Acute anemia Received 3 units of PRBCs this hospitalization.  Hemoglobin slightly downtrending but remains close to 8 Recent Labs     08/25/22 0640 08/26/22 0420 08/26/22 0555 08/27/22 0102 08/28/22 0340  HGB 8.0* QUESTIONABLE RESULTS, RECOMMEND RECOLLECT TO VERIFY 8.4* 7.8* 9.4*  MCV 94.4 QUESTIONABLE RESULTS, RECOMMEND RECOLLECT TO VERIFY 96.1 96.3 96.6     Hx of depression, bipolar, schizophrenia, insomnia. Currently on Lexapro daily, Remeron nightly, trazodone nightly.,    Dysphagia Likely related to hemorrhagic stroke Has left nasal core track with ongoing tube feeds. Speech therapy following.  Dysphagia diet currently. After my conversation with family 1/1, patient and family wanted to try regular diet and hence I put her on regular diet. Patient and family want to hold off on PEG tube insertion at this time.  Diarrhea 12/27, patient had significant loose bowel movement.  Noticed that she was not scheduled bowel regimen.  I stopped it.  Diarrhea improved.  Goals of care   Code Status: Full Code    Mobility: Encourage ambulation  Scheduled Meds:  arformoterol  15 mcg Nebulization BID   atorvastatin  80 mg Per Tube Daily   budesonide (PULMICORT) nebulizer solution  0.5 mg Nebulization BID   carvedilol  12.5 mg Per Tube BID WC   Chlorhexidine Gluconate Cloth  6 each Topical Q0600   clopidogrel  75 mg Oral Daily   escitalopram  10 mg Per Tube Daily   feeding supplement  237 mL Oral TID WC   feeding supplement (PIVOT 1.5 CAL)  1,000 mL Per Tube Q24H   feeding supplement (PROSource TF20)  60 mL Per Tube Daily   Gerhardt's butt cream   Topical BID   heparin injection (subcutaneous)  5,000 Units Subcutaneous Q8H   insulin aspart  0-15 Units Subcutaneous Q4H   insulin aspart  2 Units Subcutaneous TID WC   insulin glargine-yfgn  30 Units Subcutaneous Daily   metroNIDAZOLE  500 mg Per Tube Q12H   mirtazapine  7.5 mg Per Tube QHS   multivitamin with minerals  1 tablet Per Tube Daily   nicotine  14 mg Transdermal Daily   mouth rinse  15 mL Mouth Rinse Q4H   oxyCODONE  10 mg Oral Q12H   pantoprazole  40 mg  Oral Daily   revefenacin  175 mcg Nebulization Daily   senna-docusate  1 tablet Oral QHS   sodium chloride flush  10-40 mL Intracatheter Q12H    PRN meds: acetaminophen **OR** acetaminophen (TYLENOL) oral liquid 160 mg/5 mL **OR** acetaminophen, albuterol, hydrALAZINE, HYDROmorphone (DILAUDID) injection, ondansetron (ZOFRAN) IV, mouth rinse, oxyCODONE, polyethylene glycol, sodium chloride flush, traZODone   Infusions:    ceFAZolin (ANCEF) IV 2 g (08/28/22 0351)    Skin assessment:  Pressure Injury 08/04/22 Sacrum Stage 2 -  Partial thickness loss of dermis presenting as a shallow open injury with a red, pink wound bed without slough. (Active)  08/04/22 0100  Location: Sacrum  Location Orientation:   Staging: Stage 2 -  Partial thickness loss of dermis presenting as a shallow open injury with a red, pink wound bed without slough.  Wound Description (Comments):   Present on Admission: Yes    Nutritional status:  Body mass index is 30.08 kg/m.  Nutrition Problem: Increased nutrient needs Etiology: wound healing Signs/Symptoms: estimated needs     Diet:  Diet Order  DIET DYS 3 Room service appropriate? No; Fluid consistency: Thin  Diet effective now                   DVT prophylaxis:  heparin injection 5,000 Units Start: 08/10/22 1600   Antimicrobials: Ancef, Flagyl Fluid: None Consultants: Vascular surgery Family Communication: Last discussed with patient's daughter this afternoon 1/4. Palliative care consulted.  Status is: Inpatient  Continue in-hospital care because: Pending clinical improvement.  Patient may be a candidate for LTAC given her wound and core track requirement. Level of care: Telemetry Medical   Dispo: The patient is from: Home              Anticipated d/c is to: SNF versus LTAC              Patient currently is not medically stable to d/c.   Difficult to place patient No    Antimicrobials: Anti-infectives (From admission,  onward)    Start     Dose/Rate Route Frequency Ordered Stop   08/20/22 2200  metroNIDAZOLE (FLAGYL) IVPB 500 mg  Status:  Discontinued        500 mg 100 mL/hr over 60 Minutes Intravenous Every 12 hours 08/20/22 1110 08/20/22 1111   08/20/22 2200  metroNIDAZOLE (FLAGYL) tablet 500 mg        500 mg Per Tube Every 12 hours 08/20/22 1111     08/18/22 2300  ceFAZolin (ANCEF) IVPB 2g/100 mL premix        2 g 200 mL/hr over 30 Minutes Intravenous Every 8 hours 08/18/22 1455     08/18/22 2300  metroNIDAZOLE (FLAGYL) IVPB 500 mg  Status:  Discontinued        500 mg 100 mL/hr over 60 Minutes Intravenous Every 6 hours 08/18/22 1455 08/20/22 1110   08/15/22 1100  vancomycin (VANCOCIN) IVPB 1000 mg/200 mL premix  Status:  Discontinued        1,000 mg 200 mL/hr over 60 Minutes Intravenous Every 24 hours 08/14/22 1006 08/15/22 1307   08/14/22 1400  piperacillin-tazobactam (ZOSYN) IVPB 3.375 g  Status:  Discontinued        3.375 g 12.5 mL/hr over 240 Minutes Intravenous Every 8 hours 08/14/22 1006 08/18/22 1455   08/14/22 1100  vancomycin (VANCOREADY) IVPB 1500 mg/300 mL        1,500 mg 150 mL/hr over 120 Minutes Intravenous  Once 08/14/22 1006 08/14/22 1502   08/04/22 1045  ceFAZolin (ANCEF) IVPB 2g/100 mL premix        2 g 200 mL/hr over 30 Minutes Intravenous On call to O.R. 08/04/22 0958 08/04/22 1100       Objective: Vitals:   08/28/22 0810 08/28/22 1216  BP:    Pulse: 81 78  Resp: 15 (!) 21  Temp:    SpO2: 100% 100%    Intake/Output Summary (Last 24 hours) at 08/28/2022 1327 Last data filed at 08/28/2022 1112 Gross per 24 hour  Intake 1080 ml  Output 2930 ml  Net -1850 ml   Filed Weights   08/20/22 0500 08/27/22 0500 08/28/22 0557  Weight: 69.3 kg 66.2 kg 69.9 kg   Weight change: 3.629 kg Body mass index is 30.08 kg/m.   Physical Exam: General exam: Pleasant, elderly Caucasian female.  Pain controlled.  Cortrak feeding ongoing. Skin: No rashes, lesions or ulcers. HEENT:  Atraumatic, normocephalic, no obvious bleeding. Lungs: Clear to auscultation bilaterally CVS: Regular rate and rhythm, no murmur GI/Abd soft, nondistended, nontender, bowel sound present CNS: Alert, awake,  oriented x 3. Psychiatry: Mood appropriate Extremities: No pedal edema, no calf tenderness.  Left AKA stump clean.  Wound VAC in the groin  Data Review: I have personally reviewed the laboratory data and studies available.  F/u labs ordered Unresulted Labs (From admission, onward)     Start     Ordered   08/23/22 0500  CBC with Differential/Platelet  Tomorrow morning,   R       Question:  Specimen collection method  Answer:  Unit=Unit collect   08/22/22 0801   08/18/22 0500  CBC with Differential/Platelet  Daily,   R     Question:  Specimen collection method  Answer:  Lab=Lab collect   08/17/22 1947            Total time spent in review of labs and imaging, patient evaluation, formulation of plan, documentation and communication with family 74 minutes  Signed, Terrilee Croak, MD [facility] 08/28/2022

## 2022-08-28 NOTE — Progress Notes (Addendum)
  Progress Note    08/28/2022 6:50 AM 14 Days Post-Op  Subjective:  sleepy but awakes to voice; events overnight noted  Tm 99.1  Vitals:   08/28/22 0432 08/28/22 0600  BP:    Pulse: 77 73  Resp: 15 12  Temp:    SpO2: 99% 98%    Physical Exam: General:  no distress Incisions:  bilateral groins with vac with good seal Extremities:  right leg with staples in tact and clean; right foot is warm and well perfused.  Left AKA stump with staples in tact.   CBC    Component Value Date/Time   WBC 10.9 (H) 08/28/2022 0340   RBC 3.22 (L) 08/28/2022 0340   HGB 9.4 (L) 08/28/2022 0340   HGB 14.3 06/20/2012 2127   HCT 31.1 (L) 08/28/2022 0340   HCT 42.9 06/20/2012 2127   PLT 631 (H) 08/28/2022 0340   PLT 357 06/20/2012 2127   MCV 96.6 08/28/2022 0340   MCV 91 06/20/2012 2127   MCH 29.2 08/28/2022 0340   MCHC 30.2 08/28/2022 0340   RDW 14.9 08/28/2022 0340   RDW 13.6 06/20/2012 2127   LYMPHSABS 1.4 08/28/2022 0340   MONOABS 1.0 08/28/2022 0340   EOSABS 0.3 08/28/2022 0340   BASOSABS 0.1 08/28/2022 0340    BMET    Component Value Date/Time   NA 136 08/28/2022 0340   NA 135 (L) 06/20/2012 2127   K 4.4 08/28/2022 0340   K 4.0 06/20/2012 2127   CL 98 08/28/2022 0340   CL 100 06/20/2012 2127   CO2 30 08/28/2022 0340   CO2 27 06/20/2012 2127   GLUCOSE 232 (H) 08/28/2022 0340   GLUCOSE 250 (H) 06/20/2012 2127   BUN 36 (H) 08/28/2022 0340   BUN 20 (H) 06/20/2012 2127   CREATININE 0.71 08/28/2022 0340   CREATININE 0.60 06/20/2012 2127   CALCIUM 8.9 08/28/2022 0340   CALCIUM 8.8 06/20/2012 2127   GFRNONAA >60 08/28/2022 0340   GFRNONAA >60 06/20/2012 2127   GFRAA >60 06/20/2012 2127    INR    Component Value Date/Time   INR 1.0 08/03/2022 2156     Intake/Output Summary (Last 24 hours) at 08/28/2022 0650 Last data filed at 08/28/2022 0600 Gross per 24 hour  Intake 1080 ml  Output 2430 ml  Net -1350 ml      Assessment/Plan:  72 y.o. female is s/p:  Left AKA  and excisional debridement of bilateral groin wounds with vac placement  14 Days Post-Op And Bilateral CFA endarterectomy with bovine patch angioplasty, bilateral iliofemoral embolectomy, stenting bilateral CIA, stent right EIA and 4 compartment fasciotomies   23 Days Post-Op   -for wound vac change tomorrow 1/5 with WOC.  Wounds clean on exam yesterday with no evidence of overt infection. -aspiration event noted. -Ancef q8h and Flagyl bid for E coli and Proteus mirabilis on wound cx  -continue plavix/statin   Leontine Locket, PA-C [facility] [phone] 08/28/2022 6:50 AM   I have independently interviewed and examined patient and agree with PA assessment and plan above.   Heidi Lemay C. Donzetta Matters, MD [facility] [place] Office: [phone] Pager: [phone]

## 2022-08-28 NOTE — Progress Notes (Signed)
  X-cover Note: Called by rapid response RN. Pt with respiratory distress. Stat PCXR shows mild pulmonary edema. But improved from cxr on 08-19-2022. RT at bedside. Pt on 100% NRB. Has received nebulizer treatment. RT felt that trial of bipap in order.   Will give 1 dose of IV lasix.  Kristopher Oppenheim, DO [facility]

## 2022-08-28 NOTE — Significant Event (Addendum)
Rapid Response Event Note   Reason for Call :  SOB, hypoxia  Per RN, pt became SOB and dropped SpO2 to 84% on 2L [place].   RN placing pt on NRB.   Initial Focused Assessment:  Pt lying in bed with eyes open, alert and oriented. Pt breathing is labored, tachypneic. Lungs with decreased air movement. Skin warm and diaphoretic.   T-99.1, HR-95, BP-183/55, RR-27, SpO2-100% on NRB.   Interventions:  NRB PCXR-1. Interstitial prominence bilaterally, possible edema. 2. Small bilateral pleural effusions. Alb tx x 2 EKG-ST CBG-205 Bipap Lasix '40mg'$  IV CMP/CBC Plan of Care:  Pt breathing looks less labored on bipap. Pt says she feels like her breathing has improved. Continue bipap and allow time for lasix to work. Continue to monitor pt closely. Call RRT if further assistance needed.   Event Summary:   MD Notified: Dr. Bridgett Larsson notified PTA RRT Call [place] Time:0400  Dillard Essex, RN

## 2022-08-29 DIAGNOSIS — Z7189 Other specified counseling: Secondary | ICD-10-CM

## 2022-08-29 DIAGNOSIS — Z515 Encounter for palliative care: Secondary | ICD-10-CM | POA: Diagnosis not present

## 2022-08-29 DIAGNOSIS — L89152 Pressure ulcer of sacral region, stage 2: Secondary | ICD-10-CM | POA: Diagnosis not present

## 2022-08-29 LAB — CBC WITH DIFFERENTIAL/PLATELET
Abs Immature Granulocytes: 0.04 10*3/uL (ref 0.00–0.07)
Basophils Absolute: 0.1 10*3/uL (ref 0.0–0.1)
Basophils Relative: 1 %
Eosinophils Absolute: 0.3 10*3/uL (ref 0.0–0.5)
Eosinophils Relative: 3 %
HCT: 26.6 % — ABNORMAL LOW (ref 36.0–46.0)
Hemoglobin: 8 g/dL — ABNORMAL LOW (ref 12.0–15.0)
Immature Granulocytes: 0 %
Lymphocytes Relative: 17 %
Lymphs Abs: 1.5 10*3/uL (ref 0.7–4.0)
MCH: 28.5 pg (ref 26.0–34.0)
MCHC: 30.1 g/dL (ref 30.0–36.0)
MCV: 94.7 fL (ref 80.0–100.0)
Monocytes Absolute: 1 10*3/uL (ref 0.1–1.0)
Monocytes Relative: 11 %
Neutro Abs: 6.3 10*3/uL (ref 1.7–7.7)
Neutrophils Relative %: 68 %
Platelets: 504 10*3/uL — ABNORMAL HIGH (ref 150–400)
RBC: 2.81 MIL/uL — ABNORMAL LOW (ref 3.87–5.11)
RDW: 14.8 % (ref 11.5–15.5)
WBC: 9.2 10*3/uL (ref 4.0–10.5)
nRBC: 0 % (ref 0.0–0.2)

## 2022-08-29 LAB — GLUCOSE, CAPILLARY
Glucose-Capillary: 100 mg/dL — ABNORMAL HIGH (ref 70–99)
Glucose-Capillary: 100 mg/dL — ABNORMAL HIGH (ref 70–99)
Glucose-Capillary: 101 mg/dL — ABNORMAL HIGH (ref 70–99)
Glucose-Capillary: 105 mg/dL — ABNORMAL HIGH (ref 70–99)
Glucose-Capillary: 118 mg/dL — ABNORMAL HIGH (ref 70–99)
Glucose-Capillary: 156 mg/dL — ABNORMAL HIGH (ref 70–99)
Glucose-Capillary: 76 mg/dL (ref 70–99)
Glucose-Capillary: 87 mg/dL (ref 70–99)

## 2022-08-29 MED ORDER — OSMOLITE 1.5 CAL PO LIQD
1000.0000 mL | ORAL | Status: DC
  Filled 2022-08-29 (×6): qty 1000

## 2022-08-29 NOTE — Progress Notes (Signed)
Pt refused tube feeding tonight. She stated " I feel very full in my stomach." Per RN day shift reported, Pt ate her meal around 76% of the each meal. Pt appetite is increased. She expects the tube feeding to be held and eventually removed.  Per Pt requested.  We continue to monitor CBG q 4 hrs.   Tonight Pt is able to rest well, stable hemodynamically, afebrile, no respiratory distress. On 3 LPM of O2 NCL, SPO2 98-100%, RR is within normal limits.   She refused BiPAP.   We will monitor.  Kennyth Lose, RN

## 2022-08-29 NOTE — Progress Notes (Addendum)
Progress Note    08/29/2022 6:42 AM 15 Days Post-Op  Subjective:  says she feels good this morning.  Complains of ankle pain.   More awake this am.    Tm 99 now afebrile  Vitals:   08/29/22 0200 08/29/22 0353  BP: (!) 131/46 (!) 128/57  Pulse: 80 78  Resp: 12 13  Temp:  98.5 F (36.9 C)  SpO2: 98% 98%    Physical Exam: General:  no distress Cardiac:  regular Lungs:  non labored Incisions:  bilateral groins with wound vacs in place; right leg fasciotomy sites with staples in tact and clean; left AKA with staples in tact and some erythema around staple line.   Extremities:  brisk doppler flow right DP; right foot is warm and well perfused.    CBC    Component Value Date/Time   WBC 10.9 (H) 08/28/2022 0340   RBC 3.22 (L) 08/28/2022 0340   HGB 9.4 (L) 08/28/2022 0340   HGB 14.3 06/20/2012 2127   HCT 31.1 (L) 08/28/2022 0340   HCT 42.9 06/20/2012 2127   PLT 631 (H) 08/28/2022 0340   PLT 357 06/20/2012 2127   MCV 96.6 08/28/2022 0340   MCV 91 06/20/2012 2127   MCH 29.2 08/28/2022 0340   MCHC 30.2 08/28/2022 0340   RDW 14.9 08/28/2022 0340   RDW 13.6 06/20/2012 2127   LYMPHSABS 1.4 08/28/2022 0340   MONOABS 1.0 08/28/2022 0340   EOSABS 0.3 08/28/2022 0340   BASOSABS 0.1 08/28/2022 0340    BMET    Component Value Date/Time   NA 136 08/28/2022 0340   NA 135 (L) 06/20/2012 2127   K 4.4 08/28/2022 0340   K 4.0 06/20/2012 2127   CL 98 08/28/2022 0340   CL 100 06/20/2012 2127   CO2 30 08/28/2022 0340   CO2 27 06/20/2012 2127   GLUCOSE 232 (H) 08/28/2022 0340   GLUCOSE 250 (H) 06/20/2012 2127   BUN 36 (H) 08/28/2022 0340   BUN 20 (H) 06/20/2012 2127   CREATININE 0.71 08/28/2022 0340   CREATININE 0.60 06/20/2012 2127   CALCIUM 8.9 08/28/2022 0340   CALCIUM 8.8 06/20/2012 2127   GFRNONAA >60 08/28/2022 0340   GFRNONAA >60 06/20/2012 2127   GFRAA >60 06/20/2012 2127    INR    Component Value Date/Time   INR 1.0 08/03/2022 2156     Intake/Output  Summary (Last 24 hours) at 08/29/2022 3474 Last data filed at 08/29/2022 0421 Gross per 24 hour  Intake 876 ml  Output 1425 ml  Net -549 ml      Assessment/Plan:  72 y.o. female is s/p:  Left AKA and excisional debridement of bilateral groin wounds with vac placement  15 Days Post-Op And Bilateral CFA endarterectomy with bovine patch angioplasty, bilateral iliofemoral embolectomy, stenting bilateral CIA, stent right EIA and 4 compartment fasciotomies   24 Days Post-Op   -pt doing well this morning; incisions look fine.   Some erythema around left AKA site that is most likely reactive to staples -brisk doppler flow right DP; motor in tact; able to dorsi flex ankle.  Right foot is warm and well perfused. -wound vac change today with WOC -Ancef q8h and Flagyl bid for E coli and Proteus mirabilis on wound cx  -continue plavix/statin    Leontine Locket, PA-C [facility] [phone] 08/29/2022 6:42 AM  I have independently interviewed and examined patient and agree with PA assessment and plan above.   Siah Steely C. Donzetta Matters, MD [facility] [place] Office: [phone] Pager: [phone]

## 2022-08-29 NOTE — TOC Progression Note (Signed)
Transition of Care (TOC) - Progression Note  Marvetta Gibbons RN, BSN Transitions of Care Unit 4E- RN Case Manager See Treatment Team for direct phone #   Patient Details  Name: Saranne Crislip MRN: [id number] Date of Birth: 1951-03-13  Transition of Care [facility]) CM/SW Contact  Dahlia Client, Romeo Rabon, RN Phone Number: 08/29/2022, 12:38 PM  Clinical Narrative:    Per [facility] Liaison- Anderson [place]- submitted for insurance auth today for possible LTACH. Will follow    Expected Discharge Plan: [place] Barriers to Discharge: [place] (PASRR), Insurance Authorization, Continued Medical Work up, SNF Pending bed offer (cortrak)  Expected Discharge Plan and Services In-house Referral: Clinical Social Work   Post Acute Care Choice: [employer] (LTAC) Living arrangements for the past 2 months: [place] Determinants of Health (SDOH) Interventions [place]: Food Insecurity Present (08/11/2022)  Housing: Low Risk  (08/11/2022)  Transportation Needs: No Transportation Needs (08/11/2022)  Utilities: Not At Risk (08/11/2022)  Financial Resource Strain: Medium Risk (10/19/2018)  Physical Activity: Inactive (10/19/2018)  Social Connections: Unknown (10/19/2018)  Tobacco Use: High Risk (08/15/2022)    Readmission Risk Interventions    11/13/2020    2:27 PM  Readmission Risk Prevention Plan  Transportation Screening Complete  PCP or Specialist Appt within 3-5 Days Complete  HRI or [place] Complete  Social Work Consult for [place] Planning/Counseling Complete  Palliative Care Screening Not Applicable  Medication Review [employer]) Complete

## 2022-08-29 NOTE — Progress Notes (Signed)
Nutrition Follow-up  DOCUMENTATION CODES:   Not applicable  INTERVENTION:  Provide 1:1 assistance with setting up trays and at meals.  Initiate new nocturnal tube feeding plan via Cortrak tube: -Provide Osmolite 1.5 at 60 mL/hour x 12 hours overnight from 1800-0600 (720 mL) -Provide PROSource TF20 60 mL daily per tube -Goal regimen provides: 1160 kcal, 65 grams of protein, 547 mL H2O daily -This meets 68% minimum estimated kcal needs and 65% minimum estimated protein needs.  Provide minimum free water flush of 30 mL every 4 hours while tube feeds infusing to maintain patency.  Continue multivitamin with minerals daily per tube. This can be changed to oral once pt has improved oral intake.  Continue Ensure Enlive po TID, each supplement provides 350 kcal and 20 grams of protein.  Continue Magic cup TID with meals, each supplement provides 290 kcal and 9 grams of protein.  NUTRITION DIAGNOSIS:   Increased nutrient needs related to wound healing as evidenced by estimated needs.  Ongoing.  GOAL:   Patient will meet greater than or equal to 90% of their needs  Met with tube feeds.  MONITOR:   PO intake, Supplement acceptance, Diet advancement, Labs, Weight trends, TF tolerance, Skin, I & O's  REASON FOR ASSESSMENT:   Consult Enteral/tube feeding initiation and management  ASSESSMENT:   Pt with PMH of CHF, anxiety, PAD, bipolar, COPD, CAD, depression, DM with neuropathy, GERD, HTN, HLD, schizophrenia admitted with acute ICH and critical limb ischemia.  12/11: s/p b/l common femoral endarterectomy, iliofemoral endarterectomy, lower extremity embolectomy and stent of b/l common iliac arteries and Rt external iliac artery, and b/l 4 compartment fasciotomies  S/p cortrak placement; per xray tip in distal stomach or proximal duodenum  12/14: extubated 12/16: transferred from 4N to 4E 12/18: underwent FEES; advanced to dysphagia 2 diet with thin liquids per SLP, however there  is a comment on the diet order for milk precautions: no milk to drink, no ice cream, milkshakes, milk-like supplements as pt had penetration with thin milk on study completed 12/19: plan per team was to discontinue tube feeds, but after discussing with MD regarding poor PO intake, changed to nocturnal tube feeds instead 12/21: s/p left AKA and shart excisional debridement of bilateral surgical groin wounds and wound VAC placement 12/22: seen by SLP and diet advanced to dysphagia 3 with thin liquids; per comment section of this diet order no milk on tray but other dairy okay 12/27: diet downgraded to dysphagia 1 with thin liquids by MD per request of patient's family 12/29: SLP removed milk restrictions on tray, so now able to order Ensure supplements 12/31: diet upgraded from dysphagia 1 to dysphagia 3 1/4: overnight pt had rapid response for SOB and hypoxia and required BiPAP so tube feeds were held 1/5: overnight pt requested tube feeds be held due to feeling full  Met with patient at bedside. She was more alert today than on previous assessments. She reports her appetite is starting to improve. She reports she was able to eat 50% of her eggs, potatoes, and sausage this morning at breakfast. She ate 50% of her grilled cheese sandwich at lunch. So far today she has had 305 kcal (18% minimum kcal needs) and 15 grams of protein (15% minimum protein needs). Patient has not been taking supplements regularly. Although intake is improving, it is still not enough to meet estimated needs, especially as patient is not taking oral nutrition supplements regularly. Pt reports feelings of early satiety. She reports she is  having bowel movements. Denies any nausea. After discussing with patient, plan is to try a different tube feed and provide over 12 hours nocturnally instead of 16.   Pt was documented to be 63.5 kg on 08/08/22. Current wt is 66.2 kg.   Enteral Access: 10 Fr. Cortrak tube placed 12/11; 58 cm in  left nare secured with bridle; tip terminates in distal stomach or proximal duodenum per abdominal x-ray 12/11   Tube feed regimen: Pivot 1.5 at 60 mL/hour x 16 hours from 1600-0800 (960 mL) -Provides: 1440 kcal, 90 grams of protein, 720 mL H2O daily -Meets 85% minimum estimated kcal needs and 90% minimum estimated protein needs  UOP: 1325 mL (0.8 mL/kg/hr)  I/O: -4240.9 mL since admission  Medications reviewed and include: carvedilol, Ensure Enlive, Novolog 0-15 units Q4hrs, Novolog 2 units TID, Remeron 7.5 mg QHS, MVI, nicotine patch, pantoprazole, senna-docusate 1 tablet daily, cefazolin  Labs reviewed: CBG 76-211  Discussed with RN. Also discussed with MD via secure chat.  Diet Order:   Diet Order             DIET DYS 3 Room service appropriate? Yes with Assist; Fluid consistency: Thin  Diet effective now                  EDUCATION NEEDS:   Education needs have been addressed (Encouraged adequate intake of calories and protein and discussed importance of adequate intake.)  Skin:  Skin Assessment: Skin Integrity Issues: Skin Integrity Issues:: Stage II, Incisions, Wound VAC, Other (Comment) Stage II: sacrum Wound Vac: bilateral groin surgical wounds Incisions: closed incision to leg Other: surgical wounds bilateral groin with wound VAC  Last BM:  08/28/22 - small type 3  Height:   Ht Readings from Last 1 Encounters:  08/04/22 5' (1.524 m)   Weight:   Wt Readings from Last 1 Encounters:  08/29/22 66.2 kg   BMI:  Body mass index is 28.51 kg/m.  Estimated Nutritional Needs:   Kcal:  1700-1900  Protein:  100-125 grams  Fluid:  >1.7 L/day  [name], MS, RD, LDN, CNSC Pager number available on Amion

## 2022-08-29 NOTE — Consult Note (Signed)
Palliative Medicine Inpatient Consult Note  Consulting Provider: Dr. Pietro [name]  Reason for consult:   Heather [name] Palliative Medicine Consult  Reason for Consult? [place]   08/29/2022  HPI:  Per intake H&P --> Heather [name] is a 72 y.o. female with PMH significant for DM2, neuropathy, HTN, HLD, COPD, GERD, PAD, AS s/p TAVR, chronic diastolic CHF, anxiety/depression/schizophrenia, tobacco abuse. ? AKA 12/21. Is chronically ill and has a poor long term prognosis. The Palliative care team has been asked to get involved for further goals of care conversations.   Clinical Assessment/Goals of Care:  *Please note that this is a verbal dictation therefore any spelling or grammatical errors are due to the "[place]" system interpretation.  I have reviewed medical records including EPIC notes, labs and imaging, received report from bedside RN, assessed the patient.    I met with Heather [name] at bedside and was joined by her daughter, Heather [name] on speaker phone to further discuss diagnosis prognosis, Heather [name], EOL wishes, disposition and options.   I introduced Palliative Medicine as specialized medical care for people living with serious illness. It focuses on providing relief from the symptoms and stress of a serious illness. The goal is to improve quality of life for both the patient and the family.  Medical History Review and Understanding:  I discussed with Heather [name] her past medical history significant for type 2 diabetes, diastolic heart failure, neuropathy, peripheral vascular disease, aortic stenosis with prior TAVR.  Social History:  Heather [name] is originally from the [place] area.  She has been married to her present husband Heather [name] for the past 27 years.  She has 2 daughters from her prior relationship and 2 stepsons from her marriage to Heather [name].  She has 8 grandchildren.  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  She for recreation  likes croquet and doing crafts.  She is a woman of faith and practices within the [facility] denomination.  Functional and Nutritional State:  Prior to hospitalization Heather [name] had been living with her husband in a home in [place].  Her she had been utilizing a cane and a walker for mobility.  Her husband was helping her with most basic activities of daily living though she was able to feed herself.  Palliative Symptoms:  Pain due to ischemia, recent left AKA.  Adult failure to thrive in the setting of chronic illness.  Advance Directives:  A detailed discussion was had today regarding advanced directives.  Patient has never completed these though per discussion with she and her daughter her husband Heather [name] does suffer from dementia therefore her daughters Heather [name] and Heather [name] would be her surrogate decision makers.  Code Status:  Concepts specific to code status, artifical feeding and hydration, continued IV antibiotics and rehospitalization was had.  The difference between a aggressive medical intervention path  and a palliative comfort care path for this patient at this time was had.   Provided patient's daughter MOST form for review.  Described the steps which are taken for cardiopulmonary resuscitation.  Discussed that overall her Heather [name] would likely not fare well in such a situation and weak may cause more burden than benefit should this be pursued.  At this point in time Heather [name] would like to speak to her family more though would like full resuscitative efforts made at least for "a period of time".  I shared the importance of open and honest conversations between Heather [name] and her children regarding what her  wishes would be in the future and if she did pursue life supportive measures how long she would wish to pursue these for.   From the perspective of artificial nutrition if absolutely necessary patient would agree to a G-Tube.  Discussion:  Heather [name] and I reviewed her prolonged  hospitalization and the difficulty she is endured during this time.  We discussed the reason for her admission in the setting of her peripheral arterial disease and the need for a left above-the-knee amputation in addition to the multiple re-vascular rising procedure she is endured.  Despite this Heather [name] shares that she is still optimistic that she will be able to get to the point where she can wheel around in a wheelchair.  She recognizes that the loss of her left extremity will result in long-term debility and dependence on caregivers.  Patient's daughter is most concerned with Heather [name]'s living situation and how her stepfather is no longer able to care for her.  She asked about things such as caregivers and what the future may look like in that regard.  I expressed that I could defer to the case manager for further conversations regarding what is considered to be the need for long-term placement.  Plan for possible LTACH from acute hospitalization.  Patients hopes are for a degree of recovery so that she may be functional again.   Discussed the importance of continued conversation with family and their  medical providers regarding overall plan of care and treatment options, ensuring decisions are within the context of the patients values and GOCs.  Decision Maker: Heather [name],Heather "Heather [name]" (Spouse): [phone] (Mobile)   SUMMARY OF RECOMMENDATIONS   FULL CODE --> Patient will continue to discuss with family.   Appreciate Chaplain helping with Advance Directives for HCPOA documentation  Ongoing support  Goals for improvement  PMT to continue following along peripherally  Code Status/Advance Care Planning: FULL CODE   Symptom Management:  Pain: - Continue oxycodone '5mg'$  PO Q4H PRN - Continue Oxycontin '10mg'$  PO Q12H   Adult FTT - - Appreciate Dietician involvement - Agree with mirtazapine - 1:1 feeding - Would only do night nutrition with artificial feeds as makes patient  bloated  Palliative Prophylaxis:  Aspiration, Bowel Regimen, Delirium Protocol, Frequent Pain Assessment, Oral Care, Palliative Wound Care, and Turn Reposition  Additional Recommendations (Limitations, Scope, Preferences): Continue current care  Psycho-social/Spiritual:  Desire for further Chaplaincy support: Yes Additional Recommendations: Education on long term prognosis   Prognosis: High one year mortality risk int he setting of underlying disease burden  Discharge Planning: Discharge to [facility]  Vitals:   08/29/22 0200 08/29/22 0353  BP: (!) 131/46 (!) 128/57  Pulse: 80 78  Resp: 12 13  Temp:  98.5 F (36.9 C)  SpO2: 98% 98%    Intake/Output Summary (Last 24 hours) at 08/29/2022 7741 Last data filed at 08/29/2022 0421 Gross per 24 hour  Intake 876 ml  Output 1425 ml  Net -549 ml   Last Weight  Most recent update: 08/29/2022  4:07 AM    Weight  66.2 kg (146 lb)            Gen:  Older Caucasian F in NAD HEENT: moist mucous membranes CV: Regular rate and rhythm  PULM: ON 2LPM [place] ABD: soft/nontender  EXT: L AKA, (+) Bilat groin wounds attached to vac Neuro: Alert and oriented x3  PPS: 20%   This conversation/these recommendations were discussed with patient primary care team, Dr. Pietro [name]  Billing based on MDM: High  Problems  Addressed: One acute or chronic illness or injury that poses a threat to life or bodily function  Amount and/or Complexity of Data: Category 3:Discussion of management or test interpretation with external physician/other qualified health care professional/appropriate source (not separately reported)  Risks: Decision regarding hospitalization or escalation of hospital care and Decision not to resuscitate or to de-escalate care because of poor prognosis ______________________________________________________ [place] Team Team Cell Phone: [phone] Please utilize secure chat with additional questions,  if there is no response within 30 minutes please call the above phone number  Palliative Medicine Team providers are available by phone from 7am to 7pm daily and can be reached through the team cell phone.  Should this patient require assistance outside of these hours, please call the patient's attending physician.

## 2022-08-29 NOTE — Consult Note (Signed)
[place] Nurse wound follow up Wound type:bilateral groin wounds connected to NPWT with Y connector Measurement: see Wednesday assessment Wound bed: Beefy red with white fibrinous tissue present.  White foam to base and top with black foam.  Drainage (amount, consistency, odor) moderate serosanguinous  did not note lymphatic drainage like before.  Periwound:intact  wounds in crease of leg, apply barrier ring to periwound to promote seal.  Dressing procedure/placement/frequency: 1 piece white and 1 piece black foam removed, wound cleansed and 1 piece white and 1 piece black replaced into each wound. Covered with drape and TRAC pad.  Seal achieved at 125 mmHg,  Change MOn.Wed.Fri Will follow.  Estrellita Ludwig MSN, RN, FNP-BC CWON Wound, Ostomy, Continence Nurse [facility] [phone] Pager [phone]

## 2022-08-29 NOTE — Progress Notes (Signed)
Pt refuses bipap 

## 2022-08-29 NOTE — Progress Notes (Signed)
This chaplain responded to PMT NP-Michele consult for creating/updating her HCPOA.   The Pt. is awake and shares she is uncomfortable, experiencing pain in her legs at a level "9".  The chaplain updated the unit. The chaplain begins Advance Directive education with the Pt. The Pt. chooses to read over the document and talk to family before proceeding with completing an AD.   The chaplain will F/U with the Pt. next week. A blank document was left in the Pt. room.  This chaplain is available for F/U spiritual care as needed.  Chaplain Sallyanne Kuster [phone]

## 2022-08-29 NOTE — Progress Notes (Signed)
PROGRESS NOTE  Heather [name]  DOB: 01/26/1951  PCP: [name], [place] EHU:[id number]  DOA: 08/04/2022  LOS: 40 days  Hospital Day: 26  Brief narrative: Heather [name] is a 72 y.o. female with PMH significant for DM2, neuropathy, HTN, HLD, COPD, GERD, PAD, AS s/p TAVR, chronic diastolic CHF, anxiety/depression/schizophrenia, tobacco abuse. 12/11, patient presented to ED with sudden onset of dizziness, blurred vision, headache, nausea and vomiting.  Blood pressure was significantly elevated to over 785 systolic with EMS. She was admitted to ICU for hemorrhagic stroke and bilateral lower extremity acute on chronic ischemia.   Vascular surgery was consulted and s/p bilateral lower extremity femoral endarterectomy and patch angioplasty with iliac stenting, 4 compartment fasciotomies for acute on chronic lower extremity ischemia.  Postop remained on vent and Cleviprex.   Transferred to floor and TRH on 12/17.   See below for details  Subjective: Patient was seen and examined this morning.  Patient wanted to hold off core track feeding.  Per nursing staff, she ate good portion of her tray. Family not at bedside. 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  CK level was elevated to thousands.  Gradually downtrended. However LLE ischemia did not improve, patient started having rhabdomyolysis. Patient ultimately required left AKA on 12/24.  Wound culture grew E. coli and Proteus Mirabilis. Currently on IV Ancef and IV Flagyl.  Requires for a total of 4 to 6 weeks per vascular surgery. Continue wound VAC changes per vascular surgery. Initially started on heparin drip. Currently on Plavix 75 Mg daily.    Acute  intracranial hemorrhage  Initial CT scan showed acute intraparenchymal hematoma within the cerebellar vermis measuring 11 cc in volume and mild mass effect upon the fourth ventricle. Confirmed with an MRI. Neurology was consulted. Eventually patient was started on antiplatelet agents.  Currently on Plavix 75 mg daily.    Acute respiratory failure with hypoxia COPD, everyday smoker Extubated 12/14. Currently on low-flow oxygen. Continue Brovana twice daily, Pulmicort twice daily, Yupelri daily, albuterol PRN  Chronic diastolic CHF HTN  Currently blood pressure controlled on Coreg 12.5 mg BID.  Hydralazine as needed. Echo 12/11 with EF 60 to 65%, no WMA, indeterminate diastolic parameters.   Uncontrolled type 2 diabetes mellitus A1c 12.9 on 08/05/2022 Blood sugar control is brittle.  Frequent adjustment of insulin being made.   Currently, patient is on Semglee 30 units, reduced dose of Premeal scheduled insulin 2 units TID as well as sliding scale insulin with Accu-Cheks.  Continue to monitor Recent Labs  Lab 08/28/22 2359 08/29/22 0023 08/29/22 0355 08/29/22 0804 08/29/22 1210  GLUCAP 76 46 100* 100* 156*   Hx of CAD, HLD AS s/p TAVR. On Plavix 75 mg daily, Lipitor 80 mg daily.  Acute anemia Received 3 units of PRBCs this hospitalization.  Hemoglobin slightly downtrending but remains close to 8 Recent Labs    08/26/22 0420 08/26/22 0555 08/27/22 0102 08/28/22 0340 08/29/22 0500  HGB QUESTIONABLE RESULTS, RECOMMEND RECOLLECT TO VERIFY 8.4* 7.8* 9.4* 8.0*  MCV QUESTIONABLE RESULTS, RECOMMEND RECOLLECT TO VERIFY 96.1 96.3 96.6 94.7     Hx of depression, bipolar, schizophrenia, insomnia. Currently on Lexapro daily, Remeron nightly, trazodone nightly.,    Dysphagia Likely related to hemorrhagic stroke Has left nasal core track with ongoing tube feeds.  Patient wanted to hold tube feedings today.  Patient and family not willing to proceed to PEG tube feeding. Speech therapy  following.  Dysphagia diet currently.  Her appetite is inconsistent.  Goals of care   Code Status: Full Code  Palliative care consult appreciated.  Family wants to pursue full CODE STATUS and hoping for LTAC approval   Mobility: Encourage ambulation  Scheduled Meds:  arformoterol  15 mcg Nebulization BID   atorvastatin  80 mg Per Tube Daily   budesonide (PULMICORT) nebulizer solution  0.5 mg Nebulization BID   carvedilol  12.5 mg Per Tube BID WC   Chlorhexidine Gluconate Cloth  6 each Topical Q0600   clopidogrel  75 mg Oral Daily   escitalopram  10 mg Per Tube Daily   feeding supplement  237 mL Oral TID WC   feeding supplement (PIVOT 1.5 CAL)  1,000 mL Per Tube Q24H   feeding supplement (PROSource TF20)  60 mL Per Tube Daily   Gerhardt's butt cream   Topical BID   heparin injection (subcutaneous)  5,000 Units Subcutaneous Q8H   insulin aspart  0-15 Units Subcutaneous Q4H   insulin aspart  2 Units Subcutaneous TID WC   insulin glargine-yfgn  30 Units Subcutaneous Daily   metroNIDAZOLE  500 mg Per Tube Q12H   mirtazapine  7.5 mg Per Tube QHS   multivitamin with minerals  1 tablet Per Tube Daily   nicotine  14 mg Transdermal Daily   mouth rinse  15 mL Mouth Rinse Q4H   oxyCODONE  10 mg Oral Q12H   pantoprazole  40 mg Oral Daily   revefenacin  175 mcg Nebulization Daily   senna-docusate  1 tablet Oral QHS   sodium chloride flush  10-40 mL Intracatheter Q12H    PRN meds: acetaminophen **OR** acetaminophen (TYLENOL) oral liquid 160 mg/5 mL **OR** acetaminophen, albuterol, hydrALAZINE, HYDROmorphone (DILAUDID) injection, ondansetron (ZOFRAN) IV, mouth rinse, oxyCODONE, polyethylene glycol, sodium chloride flush, traZODone   Infusions:    ceFAZolin (ANCEF) IV 2 g (08/29/22 1022)    Skin assessment:  Pressure Injury 08/04/22 Sacrum Stage 2 -  Partial thickness loss of dermis presenting as a shallow open injury with a red, pink wound bed without slough. (Active)  08/04/22 0100   Location: Sacrum  Location Orientation:   Staging: Stage 2 -  Partial thickness loss of dermis presenting as a shallow open injury with a red, pink wound bed without slough.  Wound Description (Comments):   Present on Admission: Yes    Nutritional status:  Body mass index is 28.51 kg/m.  Nutrition Problem: Increased nutrient needs Etiology: wound healing Signs/Symptoms: estimated needs     Diet:  Diet Order             DIET DYS 3 Room service appropriate? Yes with Assist; Fluid consistency: Thin  Diet effective now                   DVT prophylaxis:  heparin injection 5,000 Units Start: 08/10/22 1600   Antimicrobials: Ancef, Flagyl Fluid: None Consultants: Vascular surgery Family Communication: Daughters are involved.  One of the daughters [name] out of state is available on the phone most.   Status is: Inpatient  Continue in-hospital care because: Pending clinical improvement.  Patient may be a candidate for LTAC given her wound and core track requirement.  Pending insurance authorization per case management. Level of care: Telemetry Medical   Dispo: The patient is from: Home  Anticipated d/c is to: SNF versus LTAC              Patient currently is not medically stable to d/c.   Difficult to place patient No    Antimicrobials: Anti-infectives (From admission, onward)    Start     Dose/Rate Route Frequency Ordered Stop   08/20/22 2200  metroNIDAZOLE (FLAGYL) IVPB 500 mg  Status:  Discontinued        500 mg 100 mL/hr over 60 Minutes Intravenous Every 12 hours 08/20/22 1110 08/20/22 1111   08/20/22 2200  metroNIDAZOLE (FLAGYL) tablet 500 mg        500 mg Per Tube Every 12 hours 08/20/22 1111     08/18/22 2300  ceFAZolin (ANCEF) IVPB 2g/100 mL premix        2 g 200 mL/hr over 30 Minutes Intravenous Every 8 hours 08/18/22 1455     08/18/22 2300  metroNIDAZOLE (FLAGYL) IVPB 500 mg  Status:  Discontinued        500 mg 100 mL/hr over 60 Minutes  Intravenous Every 6 hours 08/18/22 1455 08/20/22 1110   08/15/22 1100  vancomycin (VANCOCIN) IVPB 1000 mg/200 mL premix  Status:  Discontinued        1,000 mg 200 mL/hr over 60 Minutes Intravenous Every 24 hours 08/14/22 1006 08/15/22 1307   08/14/22 1400  piperacillin-tazobactam (ZOSYN) IVPB 3.375 g  Status:  Discontinued        3.375 g 12.5 mL/hr over 240 Minutes Intravenous Every 8 hours 08/14/22 1006 08/18/22 1455   08/14/22 1100  vancomycin (VANCOREADY) IVPB 1500 mg/300 mL        1,500 mg 150 mL/hr over 120 Minutes Intravenous  Once 08/14/22 1006 08/14/22 1502   08/04/22 1045  ceFAZolin (ANCEF) IVPB 2g/100 mL premix        2 g 200 mL/hr over 30 Minutes Intravenous On call to O.R. 08/04/22 0958 08/04/22 1100       Objective: Vitals:   08/29/22 0750 08/29/22 0846  BP: (!) 135/55   Pulse: 70   Resp: 13   Temp: 98.6 F (37 C)   SpO2: 99% 100%    Intake/Output Summary (Last 24 hours) at 08/29/2022 1323 Last data filed at 08/29/2022 0707 Gross per 24 hour  Intake 876 ml  Output 1075 ml  Net -199 ml   Filed Weights   08/27/22 0500 08/28/22 0557 08/29/22 0353  Weight: 66.2 kg 69.9 kg 66.2 kg   Weight change: -3.629 kg Body mass index is 28.51 kg/m.   Physical Exam: General exam: Pleasant, elderly Caucasian female.  Pain controlled.  Cortrak feeding on hold this morning. Skin: No rashes, lesions or ulcers. HEENT: Atraumatic, normocephalic, no obvious bleeding. Lungs: Clear to auscultation bilaterally CVS: Regular rate and rhythm, no murmur GI/Abd soft, nondistended, nontender, bowel sound present CNS: Alert, awake, oriented x 3. Psychiatry: Mood appropriate Extremities: No pedal edema, no calf tenderness.  Left AKA stump clean.  Wound VAC in the groin.  Data Review: I have personally reviewed the laboratory data and studies available.  F/u labs ordered Unresulted Labs (From admission, onward)     Start     Ordered   08/23/22 0500  CBC with Differential/Platelet   Tomorrow morning,   R       Question:  Specimen collection method  Answer:  Unit=Unit collect   08/22/22 0801   08/18/22 0500  CBC with Differential/Platelet  Daily,   R     Question:  Specimen collection method  Answer:  Lab=Lab collect   08/17/22 1947            Total time spent in review of labs and imaging, patient evaluation, formulation of plan, documentation and communication with family 11 minutes  Signed, Terrilee Croak, MD [facility] 08/29/2022

## 2022-08-29 NOTE — Progress Notes (Signed)
Pt complains of not feeling well, that she if "full".   Pt requested stop tube feeding.

## 2022-08-30 DIAGNOSIS — L89152 Pressure ulcer of sacral region, stage 2: Secondary | ICD-10-CM | POA: Diagnosis not present

## 2022-08-30 LAB — GLUCOSE, CAPILLARY
Glucose-Capillary: 128 mg/dL — ABNORMAL HIGH (ref 70–99)
Glucose-Capillary: 183 mg/dL — ABNORMAL HIGH (ref 70–99)
Glucose-Capillary: 210 mg/dL — ABNORMAL HIGH (ref 70–99)
Glucose-Capillary: 224 mg/dL — ABNORMAL HIGH (ref 70–99)
Glucose-Capillary: 42 mg/dL — CL (ref 70–99)
Glucose-Capillary: 56 mg/dL — ABNORMAL LOW (ref 70–99)
Glucose-Capillary: 70 mg/dL (ref 70–99)
Glucose-Capillary: 77 mg/dL (ref 70–99)

## 2022-08-30 LAB — CBC WITH DIFFERENTIAL/PLATELET
Abs Immature Granulocytes: 0.04 10*3/uL (ref 0.00–0.07)
Basophils Absolute: 0 10*3/uL (ref 0.0–0.1)
Basophils Relative: 1 %
Eosinophils Absolute: 0.3 10*3/uL (ref 0.0–0.5)
Eosinophils Relative: 4 %
HCT: 27.6 % — ABNORMAL LOW (ref 36.0–46.0)
Hemoglobin: 8.4 g/dL — ABNORMAL LOW (ref 12.0–15.0)
Immature Granulocytes: 1 %
Lymphocytes Relative: 12 %
Lymphs Abs: 0.9 10*3/uL (ref 0.7–4.0)
MCH: 29 pg (ref 26.0–34.0)
MCHC: 30.4 g/dL (ref 30.0–36.0)
MCV: 95.2 fL (ref 80.0–100.0)
Monocytes Absolute: 1 10*3/uL (ref 0.1–1.0)
Monocytes Relative: 12 %
Neutro Abs: 5.8 10*3/uL (ref 1.7–7.7)
Neutrophils Relative %: 70 %
Platelets: 500 10*3/uL — ABNORMAL HIGH (ref 150–400)
RBC: 2.9 MIL/uL — ABNORMAL LOW (ref 3.87–5.11)
RDW: 14.9 % (ref 11.5–15.5)
WBC: 8.1 10*3/uL (ref 4.0–10.5)
nRBC: 0 % (ref 0.0–0.2)

## 2022-08-30 MED ORDER — DEXTROSE 50 % IV SOLN
INTRAVENOUS | Status: AC
  Administered 2022-08-30: 50 mL
  Filled 2022-08-30: qty 50

## 2022-08-30 MED ORDER — INSULIN GLARGINE-YFGN 100 UNIT/ML ~~LOC~~ SOLN
15.0000 [IU] | Freq: Every day | SUBCUTANEOUS | Status: DC
  Filled 2022-08-30: qty 0.15

## 2022-08-30 NOTE — Progress Notes (Signed)
Patient given 2U novolog to cover CBG 128. Patient was actively eating about 20% of her lunch at the time.  CBG dropped to 42 at approximately 4pm.  Pt was given 240 ounces of OJ, however, pt was not drinking the juice.  1 amp D50 was given and recheck CBG was 224.

## 2022-08-30 NOTE — Progress Notes (Addendum)
Progress Note    08/30/2022 6:58 AM 16 Days Post-Op  Subjective:  says she needs a bath and wants to wash her hair.  Otherwise, says she's doing good this morning.  afebrile  Vitals:   08/29/22 2351 08/30/22 0405  BP: (!) 130/41 (!) 126/49  Pulse: 63 72  Resp: 11 16  Temp: 98.5 F (36.9 C) 98.1 F (36.7 C)  SpO2: 100% 98%    Physical Exam: General:  no distress Cardiac:  regular Lungs:  non labored Incisions:  left AKA is clean with some erythema around staples; right leg fasciotomy sites clean with staples in tact; bilateral groins with vac in place with good seal.  Extremities:  brisk doppler flow right DP; right foot is warm and well perfused.  Abdomen:  soft  CBC    Component Value Date/Time   WBC 9.2 08/29/2022 0500   RBC 2.81 (L) 08/29/2022 0500   HGB 8.0 (L) 08/29/2022 0500   HGB 14.3 06/20/2012 2127   HCT 26.6 (L) 08/29/2022 0500   HCT 42.9 06/20/2012 2127   PLT 504 (H) 08/29/2022 0500   PLT 357 06/20/2012 2127   MCV 94.7 08/29/2022 0500   MCV 91 06/20/2012 2127   MCH 28.5 08/29/2022 0500   MCHC 30.1 08/29/2022 0500   RDW 14.8 08/29/2022 0500   RDW 13.6 06/20/2012 2127   LYMPHSABS 1.5 08/29/2022 0500   MONOABS 1.0 08/29/2022 0500   EOSABS 0.3 08/29/2022 0500   BASOSABS 0.1 08/29/2022 0500    BMET    Component Value Date/Time   NA 136 08/28/2022 0340   NA 135 (L) 06/20/2012 2127   K 4.4 08/28/2022 0340   K 4.0 06/20/2012 2127   CL 98 08/28/2022 0340   CL 100 06/20/2012 2127   CO2 30 08/28/2022 0340   CO2 27 06/20/2012 2127   GLUCOSE 232 (H) 08/28/2022 0340   GLUCOSE 250 (H) 06/20/2012 2127   BUN 36 (H) 08/28/2022 0340   BUN 20 (H) 06/20/2012 2127   CREATININE 0.71 08/28/2022 0340   CREATININE 0.60 06/20/2012 2127   CALCIUM 8.9 08/28/2022 0340   CALCIUM 8.8 06/20/2012 2127   GFRNONAA >60 08/28/2022 0340   GFRNONAA >60 06/20/2012 2127   GFRAA >60 06/20/2012 2127    INR    Component Value Date/Time   INR 1.0 08/03/2022 2156      Intake/Output Summary (Last 24 hours) at 08/30/2022 0658 Last data filed at 08/29/2022 1602 Gross per 24 hour  Intake --  Output 500 ml  Net -500 ml      Assessment/Plan:  72 y.o. female is s/p:  Left AKA and excisional debridement of bilateral groin wounds with vac placement  16 Days Post-Op And Bilateral CFA endarterectomy with bovine patch angioplasty, bilateral iliofemoral embolectomy, stenting bilateral CIA, stent right EIA and 4 compartment fasciotomies   25 Days Post-Op  -pt with brisk doppler flow right DP.   -left AKA incision with some erythema around staples-most likely reactive from staples.   -bilateral groin vacs with good seal-change again on Monday with WOC. -continue statin/plavix -Ancef q8h and Flagyl bid for E coli and Proteus mirabilis on wound cx   -DVT prophylaxis:  sq heparin   Leontine Locket, PA-C [facility] [phone] 08/30/2022 6:58 AM  VASCULAR STAFF ADDENDUM: I have independently interviewed and examined the patient. I agree with the above.  AKA staple line erythematous from staple irritation  Plan for contralateral fasciotomy staple removal early next week Groin VACs holding suction Continue  abx Appreciate care for hospital medicine and nursing  Cassandria Santee, MD [facility] Phone Number: [phone] 08/30/2022 9:29 AM

## 2022-08-30 NOTE — Progress Notes (Signed)
PROGRESS NOTE  Heather [name]  DOB: Dec 23, 1950  PCP: [name], [place] GYB:[id number]  DOA: 08/04/2022  LOS: 86 days  Hospital Day: 27  Brief narrative: Heather [name] is a 72 y.o. female with PMH significant for DM2, neuropathy, HTN, HLD, COPD, GERD, PAD, AS s/p TAVR, chronic diastolic CHF, anxiety/depression/schizophrenia, tobacco abuse. 12/11, patient presented to ED with sudden onset of dizziness, blurred vision, headache, nausea and vomiting.  Blood pressure was significantly elevated to over 876 systolic with EMS. She was admitted to ICU for hemorrhagic stroke and bilateral lower extremity acute on chronic ischemia.   Vascular surgery was consulted and s/p bilateral lower extremity femoral endarterectomy and patch angioplasty with iliac stenting, 4 compartment fasciotomies for acute on chronic lower extremity ischemia.  Postop remained on vent and Cleviprex.   Transferred to floor and TRH on 12/17. Hypoglycemic 08/30/2022 with blood sugar in the 50s, insulin adjusted.  See below for details  Subjective: The patient was seen and examined at bedside.  There were no acute events overnight.  She has no new complaints.  She states she wants to go home. Currently pending LTAC approval.    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  CK level was elevated to thousands.  Gradually downtrended. However LLE ischemia did not improve, patient started having rhabdomyolysis. Patient ultimately required left AKA on 12/24.  Wound culture grew E. coli and Proteus Mirabilis. Currently on IV Ancef and IV Flagyl.  Requires for a total of 4 to 6 weeks per vascular surgery. Continue wound VAC changes per vascular surgery. Initially started on heparin drip, now  off. Currently on Plavix 75 Mg daily.    Acute intracranial hemorrhage  Initial CT scan showed acute intraparenchymal hematoma within the cerebellar vermis measuring 11 cc in volume and mild mass effect upon the fourth ventricle. Confirmed with an MRI. Neurology was consulted. Eventually patient was started on antiplatelet agents.  Currently on Plavix 75 mg daily.    Acute respiratory failure with hypoxia COPD, everyday smoker Extubated 12/14. Currently on low-flow oxygen. Continue Brovana twice daily, Pulmicort twice daily, Yupelri daily, albuterol PRN  Chronic diastolic CHF HTN  Currently blood pressure controlled on Coreg 12.5 mg BID.  Hydralazine as needed. Blood pressure is at goal. Echo 12/11 with EF 60 to 65%, no WMA, indeterminate diastolic parameters.   Uncontrolled type 2 diabetes mellitus, brittle, with hyperglycemia A1c 12.9 on 08/05/2022 Blood sugar control is brittle.  Frequent adjustment of insulin being made.   Semglee dose reduced to 15 units from 30 units Premeal scheduled insulin 2 units TID as well as sliding scale insulin with Accu-Cheks.  Continue to monitor Recent Labs  Lab 08/29/22 2345 08/30/22 0408 08/30/22 0526 08/30/22 0732 08/30/22 1155  GLUCAP 101* 56* 77 70 128*   Hx of CAD, HLD AS s/p TAVR. On Plavix 75 mg daily, Lipitor 80 mg daily.  Acute anemia Received 3 units of PRBCs this hospitalization.  Hemoglobin uptrending 8.4 from 8.0. Recent Labs    08/26/22 0555 08/27/22 0102 08/28/22 0340 08/29/22 0500 08/30/22 1133  HGB 8.4* 7.8* 9.4* 8.0* 8.4*  MCV 96.1 96.3 96.6 94.7 95.2     Hx of depression, bipolar, schizophrenia, insomnia. Currently on Lexapro daily, Remeron nightly, trazodone nightly.,    Dysphagia Likely related to hemorrhagic stroke Has left nasal core  track with ongoing tube feeds.  Patient and family not willing to proceed to PEG tube feeding. Speech therapy following.  Dysphagia diet currently.  Her appetite is  inconsistent. Aspiration precautions.  Goals of care   Code Status: Full Code  Palliative care consult appreciated.  Family wants to pursue full CODE STATUS and hoping for LTAC approval   Mobility: Encourage ambulation  Scheduled Meds:  arformoterol  15 mcg Nebulization BID   atorvastatin  80 mg Per Tube Daily   budesonide (PULMICORT) nebulizer solution  0.5 mg Nebulization BID   carvedilol  12.5 mg Per Tube BID WC   Chlorhexidine Gluconate Cloth  6 each Topical Q0600   clopidogrel  75 mg Oral Daily   escitalopram  10 mg Per Tube Daily   feeding supplement  237 mL Oral TID WC   feeding supplement (OSMOLITE 1.5 CAL)  1,000 mL Per Tube Q24H   feeding supplement (PROSource TF20)  60 mL Per Tube Daily   Gerhardt's butt cream   Topical BID   heparin injection (subcutaneous)  5,000 Units Subcutaneous Q8H   insulin aspart  0-15 Units Subcutaneous Q4H   insulin aspart  2 Units Subcutaneous TID WC   insulin glargine-yfgn  30 Units Subcutaneous Daily   metroNIDAZOLE  500 mg Per Tube Q12H   mirtazapine  7.5 mg Per Tube QHS   multivitamin with minerals  1 tablet Per Tube Daily   nicotine  14 mg Transdermal Daily   mouth rinse  15 mL Mouth Rinse Q4H   oxyCODONE  10 mg Oral Q12H   pantoprazole  40 mg Oral Daily   revefenacin  175 mcg Nebulization Daily   senna-docusate  1 tablet Oral QHS   sodium chloride flush  10-40 mL Intracatheter Q12H    PRN meds: acetaminophen **OR** acetaminophen (TYLENOL) oral liquid 160 mg/5 mL **OR** acetaminophen, albuterol, hydrALAZINE, HYDROmorphone (DILAUDID) injection, ondansetron (ZOFRAN) IV, mouth rinse, oxyCODONE, polyethylene glycol, sodium chloride flush, traZODone   Infusions:    ceFAZolin (ANCEF) IV 2 g (08/30/22 1022)    Skin assessment:  Pressure Injury 08/04/22 Sacrum Stage 2 -  Partial thickness loss of dermis presenting as a shallow open injury with a red, pink wound bed without slough. (Active)  08/04/22 0100  Location: Sacrum  Location  Orientation:   Staging: Stage 2 -  Partial thickness loss of dermis presenting as a shallow open injury with a red, pink wound bed without slough.  Wound Description (Comments):   Present on Admission: Yes    Nutritional status:  Body mass index is 28.51 kg/m.  Nutrition Problem: Increased nutrient needs Etiology: wound healing Signs/Symptoms: estimated needs     Diet:  Diet Order             DIET DYS 3 Room service appropriate? Yes with Assist; Fluid consistency: Thin  Diet effective now                   DVT prophylaxis:  heparin injection 5,000 Units Start: 08/10/22 1600   Antimicrobials: Ancef, Flagyl Fluid: None Consultants: Vascular surgery Family Communication: Daughters are involved.  One of the daughters [name] out of state is available on the phone most.   Status is: Inpatient  Continue in-hospital care because: Pending clinical improvement.  Patient may be a candidate for LTAC given her wound and core track requirement.  Pending insurance authorization per case management. Level of care: Telemetry Medical   Dispo: The patient is from: Home  Anticipated d/c is to: SNF versus LTAC              Patient currently is not medically stable to d/c.   Difficult to place patient No    Antimicrobials: Anti-infectives (From admission, onward)    Start     Dose/Rate Route Frequency Ordered Stop   08/20/22 2200  metroNIDAZOLE (FLAGYL) IVPB 500 mg  Status:  Discontinued        500 mg 100 mL/hr over 60 Minutes Intravenous Every 12 hours 08/20/22 1110 08/20/22 1111   08/20/22 2200  metroNIDAZOLE (FLAGYL) tablet 500 mg        500 mg Per Tube Every 12 hours 08/20/22 1111     08/18/22 2300  ceFAZolin (ANCEF) IVPB 2g/100 mL premix        2 g 200 mL/hr over 30 Minutes Intravenous Every 8 hours 08/18/22 1455     08/18/22 2300  metroNIDAZOLE (FLAGYL) IVPB 500 mg  Status:  Discontinued        500 mg 100 mL/hr over 60 Minutes Intravenous Every 6 hours  08/18/22 1455 08/20/22 1110   08/15/22 1100  vancomycin (VANCOCIN) IVPB 1000 mg/200 mL premix  Status:  Discontinued        1,000 mg 200 mL/hr over 60 Minutes Intravenous Every 24 hours 08/14/22 1006 08/15/22 1307   08/14/22 1400  piperacillin-tazobactam (ZOSYN) IVPB 3.375 g  Status:  Discontinued        3.375 g 12.5 mL/hr over 240 Minutes Intravenous Every 8 hours 08/14/22 1006 08/18/22 1455   08/14/22 1100  vancomycin (VANCOREADY) IVPB 1500 mg/300 mL        1,500 mg 150 mL/hr over 120 Minutes Intravenous  Once 08/14/22 1006 08/14/22 1502   08/04/22 1045  ceFAZolin (ANCEF) IVPB 2g/100 mL premix        2 g 200 mL/hr over 30 Minutes Intravenous On call to O.R. 08/04/22 0958 08/04/22 1100       Objective: Vitals:   08/30/22 0857 08/30/22 1100  BP:  (!) 126/92  Pulse:  75  Resp:  15  Temp:  98.9 F (37.2 C)  SpO2: 98% 95%    Intake/Output Summary (Last 24 hours) at 08/30/2022 1336 Last data filed at 08/30/2022 1229 Gross per 24 hour  Intake --  Output 600 ml  Net -600 ml   Filed Weights   08/27/22 0500 08/28/22 0557 08/29/22 0353  Weight: 66.2 kg 69.9 kg 66.2 kg   Weight change:  Body mass index is 28.51 kg/m.   Physical Exam: General exam: Pleasant in no acute distress.   Skin: No rashes, lesions or ulcers. HEENT: Atraumatic, normocephalic, no obvious bleeding. Lungs: Clear to auscultation no wheezes or rales.  CVS: Regular rate and rhythm no rubs or gallops.   GI/Abd soft nontender normal bowel sounds present.  CNS: Alert, awake, oriented x 3. Psychiatry: Mood appropriate Extremities: No pedal edema, no calf tenderness.  Left AKA stump clean.  Wound VAC in the groin.  Data Review: I have personally reviewed the laboratory data and studies available.  F/u labs ordered Unresulted Labs (From admission, onward)     Start     Ordered   08/23/22 0500  CBC with Differential/Platelet  Tomorrow morning,   R       Question:  Specimen collection method  Answer:  Unit=Unit  collect   08/22/22 0801   08/18/22 0500  CBC with Differential/Platelet  Daily,   R     Question:  Specimen collection method  Answer:  Lab=Lab collect   08/17/22 1947            Total time spent in review of labs and imaging, patient evaluation, formulation of plan, documentation and communication with family 13 minutes  Signed, Kayleen Memos, MD [facility] 08/30/2022

## 2022-08-30 NOTE — Progress Notes (Signed)
Assess right IJ CVC per IV team consult.  Brown lumen sluggish flush and NBR.  Looking back at the chart this has been an ongoing issue since 12/20.  TPA attempted without resolution of the issue.  12/23 CXR revealed an internal kink in the line and IV team recommended a PICC line if central access continues to be necessary.  Primary RN notified by secure chat.

## 2022-08-31 DIAGNOSIS — L89152 Pressure ulcer of sacral region, stage 2: Secondary | ICD-10-CM | POA: Diagnosis not present

## 2022-08-31 LAB — BASIC METABOLIC PANEL
Anion gap: 9 (ref 5–15)
BUN: 17 mg/dL (ref 8–23)
CO2: 28 mmol/L (ref 22–32)
Calcium: 8.6 mg/dL — ABNORMAL LOW (ref 8.9–10.3)
Chloride: 99 mmol/L (ref 98–111)
Creatinine, Ser: 0.74 mg/dL (ref 0.44–1.00)
GFR, Estimated: >60 mL/min (ref >60)
Glucose, Bld: 142 mg/dL — ABNORMAL HIGH (ref 70–99)
Potassium: 4.2 mmol/L (ref 3.5–5.1)
Sodium: 136 mmol/L (ref 135–145)

## 2022-08-31 LAB — GLUCOSE, CAPILLARY
Glucose-Capillary: 116 mg/dL — ABNORMAL HIGH (ref 70–99)
Glucose-Capillary: 150 mg/dL — ABNORMAL HIGH (ref 70–99)
Glucose-Capillary: 194 mg/dL — ABNORMAL HIGH (ref 70–99)
Glucose-Capillary: 197 mg/dL — ABNORMAL HIGH (ref 70–99)
Glucose-Capillary: 274 mg/dL — ABNORMAL HIGH (ref 70–99)
Glucose-Capillary: 66 mg/dL — ABNORMAL LOW (ref 70–99)

## 2022-08-31 LAB — CBC WITH DIFFERENTIAL/PLATELET
Abs Immature Granulocytes: 0.06 10*3/uL (ref 0.00–0.07)
Basophils Absolute: 0.1 10*3/uL (ref 0.0–0.1)
Basophils Relative: 1 %
Eosinophils Absolute: 0.3 10*3/uL (ref 0.0–0.5)
Eosinophils Relative: 4 %
HCT: 25.5 % — ABNORMAL LOW (ref 36.0–46.0)
Hemoglobin: 8.1 g/dL — ABNORMAL LOW (ref 12.0–15.0)
Immature Granulocytes: 1 %
Lymphocytes Relative: 17 %
Lymphs Abs: 1.3 10*3/uL (ref 0.7–4.0)
MCH: 29.8 pg (ref 26.0–34.0)
MCHC: 31.8 g/dL (ref 30.0–36.0)
MCV: 93.8 fL (ref 80.0–100.0)
Monocytes Absolute: 0.9 10*3/uL (ref 0.1–1.0)
Monocytes Relative: 13 %
Neutro Abs: 4.8 10*3/uL (ref 1.7–7.7)
Neutrophils Relative %: 64 %
Platelets: 470 10*3/uL — ABNORMAL HIGH (ref 150–400)
RBC: 2.72 MIL/uL — ABNORMAL LOW (ref 3.87–5.11)
RDW: 15 % (ref 11.5–15.5)
WBC: 7.4 10*3/uL (ref 4.0–10.5)
nRBC: 0 % (ref 0.0–0.2)

## 2022-08-31 MED ORDER — INSULIN GLARGINE-YFGN 100 UNIT/ML ~~LOC~~ SOLN
5.0000 [IU] | Freq: Every day | SUBCUTANEOUS | Status: DC
  Administered 2022-09-01 – 2022-09-08 (×7): 5 [IU] via SUBCUTANEOUS
  Filled 2022-08-31 (×10): qty 0.05

## 2022-08-31 NOTE — Plan of Care (Signed)
  Problem: Education: Goal: Knowledge of disease or condition will improve Outcome: Progressing   

## 2022-08-31 NOTE — Progress Notes (Addendum)
Progress Note    08/31/2022 7:13 AM 17 Days Post-Op  Subjective:  says she feels good this morning.  C/o some burning pain in the right foot.   afebrile  Vitals:   08/30/22 2109 08/31/22 0300  BP:  (!) 123/50  Pulse:  67  Resp:  14  Temp:  98.6 F (37 C)  SpO2: 93%     Physical Exam: General:  no distress Cardiac:  regular Lungs:  non labored Incisions:  bilateral groin incisions with vac in place with good seal; right leg incisions clean with staples in tact; left AKA clean with staples in tact and less erythema today.  Extremities:  brisk doppler flow right DP>PT; right foot warm with motor and sensory in tact   CBC    Component Value Date/Time   WBC 7.4 08/31/2022 0103   RBC 2.72 (L) 08/31/2022 0103   HGB 8.1 (L) 08/31/2022 0103   HGB 14.3 06/20/2012 2127   HCT 25.5 (L) 08/31/2022 0103   HCT 42.9 06/20/2012 2127   PLT 470 (H) 08/31/2022 0103   PLT 357 06/20/2012 2127   MCV 93.8 08/31/2022 0103   MCV 91 06/20/2012 2127   MCH 29.8 08/31/2022 0103   MCHC 31.8 08/31/2022 0103   RDW 15.0 08/31/2022 0103   RDW 13.6 06/20/2012 2127   LYMPHSABS 1.3 08/31/2022 0103   MONOABS 0.9 08/31/2022 0103   EOSABS 0.3 08/31/2022 0103   BASOSABS 0.1 08/31/2022 0103    BMET    Component Value Date/Time   NA 136 08/28/2022 0340   NA 135 (L) 06/20/2012 2127   K 4.4 08/28/2022 0340   K 4.0 06/20/2012 2127   CL 98 08/28/2022 0340   CL 100 06/20/2012 2127   CO2 30 08/28/2022 0340   CO2 27 06/20/2012 2127   GLUCOSE 232 (H) 08/28/2022 0340   GLUCOSE 250 (H) 06/20/2012 2127   BUN 36 (H) 08/28/2022 0340   BUN 20 (H) 06/20/2012 2127   CREATININE 0.71 08/28/2022 0340   CREATININE 0.60 06/20/2012 2127   CALCIUM 8.9 08/28/2022 0340   CALCIUM 8.8 06/20/2012 2127   GFRNONAA >60 08/28/2022 0340   GFRNONAA >60 06/20/2012 2127   GFRAA >60 06/20/2012 2127    INR    Component Value Date/Time   INR 1.0 08/03/2022 2156     Intake/Output Summary (Last 24 hours) at 08/31/2022  0713 Last data filed at 08/31/2022 0300 Gross per 24 hour  Intake 600 ml  Output 1450 ml  Net -850 ml      Assessment/Plan:  72 y.o. female is s/p:  Left AKA and excisional debridement of bilateral groin wounds with vac placement  17 Days Post-Op And Bilateral CFA endarterectomy with bovine patch angioplasty, bilateral iliofemoral embolectomy, stenting bilateral CIA, stent right EIA and 4 compartment fasciotomies   26 Days Post-Op   -pt with brisk doppler flow right foot; burning pain in right foot most likely neuropathy -incisions healing nicely -bilateral groins with wound vacs in place with good seal-wound vac change tomorrow with WOC -DVT prophylaxis:  sq heparin -continue plavix/statin -Ancef q8h and Flagyl bid for E coli and Proteus mirabilis on wound culture   Leontine Locket, PA-C [facility] [phone] 08/31/2022 7:13 AM  VASCULAR STAFF ADDENDUM: I have independently interviewed and examined the patient. I agree with the above.  Right foot pain resolved when I examined her.  Cassandria Santee, MD [facility] Phone Number: [phone] 08/31/2022 10:11 AM

## 2022-08-31 NOTE — Progress Notes (Signed)
PROGRESS NOTE  Heather [name]  DOB: Feb 18, 1951  PCP: [name], [place] YTK:[id number]  DOA: 08/04/2022  LOS: 31 days  Hospital Day: 28  Brief narrative: Heather [name] is a 72 y.o. female with PMH significant for DM2, neuropathy, HTN, HLD, COPD, GERD, PAD, AS s/p TAVR, chronic diastolic CHF, anxiety/depression/schizophrenia, tobacco abuse. 12/11, patient presented to ED with sudden onset of dizziness, blurred vision, headache, nausea and vomiting.  Blood pressure was significantly elevated to over 557 systolic with EMS. She was admitted to ICU for hemorrhagic stroke and bilateral lower extremity acute on chronic ischemia.   Vascular surgery was consulted and s/p bilateral lower extremity femoral endarterectomy and patch angioplasty with iliac stenting, 4 compartment fasciotomies for acute on chronic lower extremity ischemia.  Postop remained on vent and Cleviprex.   Transferred to floor and TRH on 12/17. Hypoglycemic 08/30/2022 with blood sugar in the 50s, insulin adjusted.  See below for details  Subjective: Seen at bedside.  Eating breakfast.  In no acute distress.  There were no acute events overnight.  She has no new complaints. Currently pending LTAC approval.    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  CK level was elevated to thousands.  Gradually downtrended. However LLE ischemia did not improve, patient started having rhabdomyolysis. Patient ultimately required left AKA on 12/24.  Wound culture grew E. coli and Proteus Mirabilis. Currently on IV Ancef and IV Flagyl.  Requires for a total of 4 to 6 weeks per vascular surgery. Continue wound VAC changes per vascular surgery. Initially started on heparin drip, now off. Currently on  Plavix 75 Mg daily.    Acute intracranial hemorrhage  Initial CT scan showed acute intraparenchymal hematoma within the cerebellar vermis measuring 11 cc in volume and mild mass effect upon the fourth ventricle. Confirmed with an MRI. Neurology was consulted. Eventually patient was started on antiplatelet agents.  Currently on Plavix 75 mg daily.    Acute respiratory failure with hypoxia COPD, everyday smoker Extubated 12/14. Currently on low-flow oxygen. Continue Brovana twice daily, Pulmicort twice daily, Yupelri daily, albuterol PRN  Chronic diastolic CHF HTN  Currently blood pressure controlled on Coreg 12.5 mg BID.  Hydralazine as needed. Blood pressure is at goal. Echo 12/11 with EF 60 to 65%, no WMA, indeterminate diastolic parameters.   Uncontrolled type 2 diabetes mellitus, brittle, with hyperglycemia A1c 12.9 on 08/05/2022 Blood sugar control is brittle.  Frequent adjustment of insulin being made.   Semglee dose reduced to 15 units from 30 units Premeal scheduled insulin 2 units TID as well as sliding scale insulin with Accu-Cheks.  Continue to monitor Recent Labs  Lab 08/30/22 2341 08/31/22 0756 08/31/22 0924 08/31/22 1209 08/31/22 1605  GLUCAP 183* 23* 116* 197* 150*   Hx of CAD, HLD AS s/p TAVR. On Plavix 75 mg daily, Lipitor 80 mg daily.  Acute anemia Received 3 units of PRBCs this hospitalization.  Hemoglobin downtrending 8.1 from 8.4.  Continue to monitor H&H Recent Labs    08/27/22 0102 08/28/22 0340 08/29/22 0500 08/30/22 1133 08/31/22 0103  HGB 7.8* 9.4* 8.0* 8.4* 8.1*  MCV 96.3 96.6 94.7 95.2 93.8     Hx of depression, bipolar, schizophrenia, insomnia. Currently on Lexapro daily, Remeron nightly, trazodone nightly.,    Dysphagia Likely related to hemorrhagic stroke Has left nasal core  track with ongoing tube feeds.  Patient and family not willing to proceed to PEG tube feeding. Speech therapy following.  Dysphagia diet currently.  Her  appetite is inconsistent. Aspiration precautions.  Goals of care   Code Status: Full Code  Palliative care consult appreciated.  Family wants to pursue full CODE STATUS and hoping for LTAC approval   Mobility: Encourage ambulation  Scheduled Meds:  arformoterol  15 mcg Nebulization BID   atorvastatin  80 mg Per Tube Daily   budesonide (PULMICORT) nebulizer solution  0.5 mg Nebulization BID   carvedilol  12.5 mg Per Tube BID WC   Chlorhexidine Gluconate Cloth  6 each Topical Q0600   clopidogrel  75 mg Oral Daily   escitalopram  10 mg Per Tube Daily   feeding supplement  237 mL Oral TID WC   feeding supplement (OSMOLITE 1.5 CAL)  1,000 mL Per Tube Q24H   feeding supplement (PROSource TF20)  60 mL Per Tube Daily   Gerhardt's butt cream   Topical BID   heparin injection (subcutaneous)  5,000 Units Subcutaneous Q8H   insulin aspart  0-15 Units Subcutaneous Q4H   insulin aspart  2 Units Subcutaneous TID WC   insulin glargine-yfgn  5 Units Subcutaneous Daily   metroNIDAZOLE  500 mg Per Tube Q12H   mirtazapine  7.5 mg Per Tube QHS   multivitamin with minerals  1 tablet Per Tube Daily   nicotine  14 mg Transdermal Daily   mouth rinse  15 mL Mouth Rinse Q4H   oxyCODONE  10 mg Oral Q12H   pantoprazole  40 mg Oral Daily   revefenacin  175 mcg Nebulization Daily   senna-docusate  1 tablet Oral QHS   sodium chloride flush  10-40 mL Intracatheter Q12H    PRN meds: acetaminophen **OR** acetaminophen (TYLENOL) oral liquid 160 mg/5 mL **OR** acetaminophen, albuterol, hydrALAZINE, HYDROmorphone (DILAUDID) injection, ondansetron (ZOFRAN) IV, mouth rinse, oxyCODONE, polyethylene glycol, sodium chloride flush, traZODone   Infusions:    ceFAZolin (ANCEF) IV 2 g (08/31/22 1716)    Skin assessment:  Pressure Injury 08/04/22 Sacrum Stage 2 -  Partial thickness loss of dermis presenting as a shallow open injury with a red, pink wound bed without slough. (Active)  08/04/22 0100  Location: Sacrum   Location Orientation:   Staging: Stage 2 -  Partial thickness loss of dermis presenting as a shallow open injury with a red, pink wound bed without slough.  Wound Description (Comments):   Present on Admission: Yes    Nutritional status:  Body mass index is 28.51 kg/m.  Nutrition Problem: Increased nutrient needs Etiology: wound healing Signs/Symptoms: estimated needs     Diet:  Diet Order             DIET DYS 3 Room service appropriate? Yes with Assist; Fluid consistency: Thin  Diet effective now                   DVT prophylaxis:  heparin injection 5,000 Units Start: 08/10/22 1600   Antimicrobials: Ancef, Flagyl Fluid: None Consultants: Vascular surgery Family Communication: Daughters are involved.  One of the daughters [name] out of state is available on the phone most.   Status is: Inpatient  Continue in-hospital care because: Pending clinical improvement.  Patient may be a candidate for LTAC given her wound and core track requirement.  Pending insurance authorization per case management. Level of care: Telemetry Medical   Dispo: The patient is from: Home  Anticipated d/c is to: SNF versus LTAC              Patient currently is not medically stable to d/c.   Difficult to place patient No    Antimicrobials: Anti-infectives (From admission, onward)    Start     Dose/Rate Route Frequency Ordered Stop   08/20/22 2200  metroNIDAZOLE (FLAGYL) IVPB 500 mg  Status:  Discontinued        500 mg 100 mL/hr over 60 Minutes Intravenous Every 12 hours 08/20/22 1110 08/20/22 1111   08/20/22 2200  metroNIDAZOLE (FLAGYL) tablet 500 mg        500 mg Per Tube Every 12 hours 08/20/22 1111     08/18/22 2300  ceFAZolin (ANCEF) IVPB 2g/100 mL premix        2 g 200 mL/hr over 30 Minutes Intravenous Every 8 hours 08/18/22 1455     08/18/22 2300  metroNIDAZOLE (FLAGYL) IVPB 500 mg  Status:  Discontinued        500 mg 100 mL/hr over 60 Minutes Intravenous Every 6  hours 08/18/22 1455 08/20/22 1110   08/15/22 1100  vancomycin (VANCOCIN) IVPB 1000 mg/200 mL premix  Status:  Discontinued        1,000 mg 200 mL/hr over 60 Minutes Intravenous Every 24 hours 08/14/22 1006 08/15/22 1307   08/14/22 1400  piperacillin-tazobactam (ZOSYN) IVPB 3.375 g  Status:  Discontinued        3.375 g 12.5 mL/hr over 240 Minutes Intravenous Every 8 hours 08/14/22 1006 08/18/22 1455   08/14/22 1100  vancomycin (VANCOREADY) IVPB 1500 mg/300 mL        1,500 mg 150 mL/hr over 120 Minutes Intravenous  Once 08/14/22 1006 08/14/22 1502   08/04/22 1045  ceFAZolin (ANCEF) IVPB 2g/100 mL premix        2 g 200 mL/hr over 30 Minutes Intravenous On call to O.R. 08/04/22 0958 08/04/22 1100       Objective: Vitals:   08/31/22 1211 08/31/22 1609  BP: (!) 137/46 (!) 133/48  Pulse: 81 77  Resp: 18 18  Temp: 98.9 F (37.2 C) 98.7 F (37.1 C)  SpO2: 91% (!) 88%    Intake/Output Summary (Last 24 hours) at 08/31/2022 1744 Last data filed at 08/31/2022 1610 Gross per 24 hour  Intake 600 ml  Output 1875 ml  Net -1275 ml   Filed Weights   08/27/22 0500 08/28/22 0557 08/29/22 0353  Weight: 66.2 kg 69.9 kg 66.2 kg   Weight change:  Body mass index is 28.51 kg/m.   Physical Exam: No significant changes from prior exam. General exam: Pleasant in no acute distress.   Skin: No rashes, lesions or ulcers. HEENT: Atraumatic, normocephalic, no obvious bleeding. Lungs: Clear to auscultation no wheezes or rales.   CVS: Regular rate and rhythm no rubs or gallops.   GI/Abd soft nontender normal bowel sounds present.  CNS: Alert, awake, oriented x 3. Psychiatry: Mood appropriate Extremities: No pedal edema, no calf tenderness.  Left AKA stump clean.  Wound VAC in the groin.  Data Review: I have personally reviewed the laboratory data and studies available.  F/u labs ordered Unresulted Labs (From admission, onward)     Start     Ordered   08/23/22 0500  CBC with Differential/Platelet   Tomorrow morning,   R       Question:  Specimen collection method  Answer:  Unit=Unit collect   08/22/22 0801  Total time spent in review of labs and imaging, patient evaluation, formulation of plan, documentation and communication with family 65 minutes  Signed, Kayleen Memos, MD [facility] 08/31/2022

## 2022-09-01 DIAGNOSIS — I614 Nontraumatic intracerebral hemorrhage in cerebellum: Secondary | ICD-10-CM | POA: Diagnosis not present

## 2022-09-01 LAB — GLUCOSE, CAPILLARY
Glucose-Capillary: 333 mg/dL — ABNORMAL HIGH (ref 70–99)
Glucose-Capillary: 194 mg/dL — ABNORMAL HIGH (ref 70–99)
Glucose-Capillary: 217 mg/dL — ABNORMAL HIGH (ref 70–99)
Glucose-Capillary: 143 mg/dL — ABNORMAL HIGH (ref 70–99)
Glucose-Capillary: 131 mg/dL — ABNORMAL HIGH (ref 70–99)

## 2022-09-01 NOTE — Consult Note (Signed)
[place] Nurse wound follow up Wound type: bilateral groin wounds.  Today, we will bridge the two wounds together in the center of protected, intact skin.  This will decrease the usage of supplies. Patient is 18 days post op.    Measurement: assessed on Wednesdays Wound bed: no change.  White and beefy red, bleeds with cleansing.  Drainage (amount, consistency, odor) moderate serous drainage. Canister is full of serous liquid today and is changed.  Periwound: in inguinal fold. Barrier ring to periwound to protect skin and promote seal.  Dressing procedure/placement/frequency: FIll each wound bed with white foam and then black.  Protect skin between wounds across abdomen with drape.  Place a bridging piece of black foam to connect the two wounds.  Cover with drape and Trac pad.  Seal achieved at 125 mmHg,  Change Mon.Wed.Fri.  Will follow.  Estrellita Ludwig MSN, RN, FNP-BC [facility] Wound, Ostomy, Continence Nurse [facility] [phone] Pager [phone]

## 2022-09-01 NOTE — Progress Notes (Signed)
PROGRESS NOTE Heather [name]  YYT:[id number] DOB: Jun 15, 1951 DOA: 08/04/2022 PCP: [name], FNP   Brief Narrative/Hospital Course:  72 y.o. female with PMH significant for DM2, neuropathy, HTN, HLD, COPD, GERD, PAD, AS s/p TAVR, chronic diastolic CHF, anxiety/depression/schizophrenia, tobacco abuse. 12/11, she presented to ED with sudden onset of dizziness, blurred vision, headache, nausea and vomiting. Blood pressure was significantly elevated to over 275 systolic with EMS. She was admitted to ICU for hemorrhagic stroke and bilateral lower extremity acute on chronic ischemia.Vascular surgery was consulted and s/p bilateral lower extremity femoral endarterectomy and patch angioplasty with iliac stenting, 4 compartment fasciotomies for acute on chronic lower extremity ischemia. Postop remained on vent and Cleviprex.Transferred to floor and TRH on 12/17. Hypoglycemic 08/30/2022 with blood sugar in the 50s, insulin adjusted  Subjective: Seen and examined Overnight patient has been afebrile BP 120s to 130s.  On 3 L nasal cannula Labs reviewed blood sugar 270 -333 Feels well, able to eat 30 % this am and po intake picking up On [place] o2, Cortrak as well   Assessment and Plan: Principal Problem:   ICH (intracerebral hemorrhage) (HCC) Active Problems:   Abscess of anal and rectal regions   Decubitus ulcer of sacral region, stage 2 ([place])   Pulmonary edema  Rhabdomyolysis Left AKA and excisional debridement of bilateral groin wounds with VAC placement 12/21 Bilateral CFA endarterectomy with bovine patch angioplasty Bilateral iliofemoral embolectomy, stenting Bilateral CIA, stent Rt EIA and 4 compartment fasciotomies: Followed by vascular surgery on board input appreciated.  Patient with baseline Doppler flow right DP, staples intact fasciotomies> to be removed 1/8, wound VAC of bilateral groins and wound appears clean.  Wound care to replace backside liters today and continue MWF VAC  changes. Continue Plavix/statin. Continue Ancef and Flagyl for E. coli and Proteus [name] and wound culture Will need total of 4 to 6 weeks of antibiotics per vascular surgery.  Acute ICH: Initial CT scan showed acute intraparenchymal hematoma within the cerebellar vermis measuring 11 cc in volume and mild mass effect upon the fourth ventricle. Confirmed with an MRI.  Seen by neurology.  Eventually placed on antiplatelet agents tolerating Plavix 75.  Acute respiratory failure with hypoxia COPD  Smoker : Extubated 12/14, continue supplemental oxygen [place] Pulmicort,Yupleri and albuterol as needed   Chronic diastolic CHF HTN: BP well-controlled, continue Coreg 12.5 twice daily, prn Hydralazine.  Monitor fluid status Echo EF 60 to 60% on 12/11 w/  indeterminate diastolic parameters. Net IO Since Admission: 6,677.22 mL [09/01/22 1121]   Intake/Output Summary (Last 24 hours) at 09/01/2022 1121 Last data filed at 09/01/2022 0131 Gross per 24 hour  Intake 300 ml  Output 675 ml  Net -375 ml     Type 2 diabetes mellitus with uncontrolled hyperglycemia A1c poorly controlled 12.9: On Semglee 5 units daily, 2 units Premeal Humalog and SSI.  It seems patient has been refusing insulin , her sugar is poorly controlled, has brittle DM. Recent Labs  Lab 08/31/22 1605 08/31/22 1949 08/31/22 2335 09/01/22 0320 09/01/22 0817  GLUCAP 150* 194* 274* 333* 194*     Hx of CAD HLD AS s/p TAVR: Continue Plavix Lipitor.     Acute blood loss anemia in the setting of surgery: Received 3 units PRBC so far.  Monitor as below Recent Labs  Lab 08/27/22 0102 08/28/22 0340 08/29/22 0500 08/30/22 1133 08/31/22 0103  HGB 7.8* 9.4* 8.0* 8.4* 8.1*  HCT 25.9* 31.1* 26.6* 27.6* 25.5*    Depression, bipolar, schizophrenia, insomnia: cont on  Lexapro daily, Remeron nightly, trazodone nightly.,    Dysphagia:Likely related to [place].Has left nasal core track with ongoing tube feeds.  Patient and family not  willing to proceed to PEG tube feeding. Slp following, continue dysphagia diet continue aspiration precaution  Pressure injury stage II sacrum POA see below: Pressure Injury 08/04/22 Sacrum Stage 2 -  Partial thickness loss of dermis presenting as a shallow open injury with a red, pink wound bed without slough. (Active)  08/04/22 0100  Location: Sacrum  Location Orientation:   Staging: Stage 2 -  Partial thickness loss of dermis presenting as a shallow open injury with a red, pink wound bed without slough.  Wound Description (Comments):   Present on Admission: Yes  Dressing Type Foam - Lift dressing to assess site every shift 08/31/22 1950   DVT prophylaxis: heparin injection 5,000 Units Start: 08/10/22 1600 Code Status:   Code Status: Full Code Family Communication: plan of care discussed with patient at bedside. Patient status is: inpatient  because of poor po intake Level of care: Telemetry Medical   Dispo: The patient is from: Home, w/. Husband, he is here daily            Anticipated disposition: LTACH pending  Objective: Vitals last 24 hrs: Vitals:   09/01/22 0322 09/01/22 0757 09/01/22 0759 09/01/22 0800  BP: 120/61 (!) 137/38    Pulse: 70 71    Resp: 11 11    Temp: 98.9 F (37.2 C) 98 F (36.7 C)    TempSrc: Oral     SpO2: 96% 99% 99% 100%  Weight:      Height:       Weight change:   Physical Examination: General exam: alert awake, older than stated age HEENT:Oral mucosa moist, Ear/Nose WNL grossly Respiratory system: bilaterally clear  BS, no use of accessory muscle Cardiovascular system: S1 & S2 +, No JVD. Gastrointestinal system: Abdomen soft,NT,ND, BS+ Nervous System:Alert, awake, moving extremities. Extremities: Lt AKA stump clean staples +, RLE and surgical site w/ staples+  Skin: No rashes,no icterus. MSK: Normal muscle bulk,tone, power Wound vac on lower abdomen, foley+  Medications reviewed:  Scheduled Meds:  arformoterol  15 mcg Nebulization BID    atorvastatin  80 mg Per Tube Daily   budesonide (PULMICORT) nebulizer solution  0.5 mg Nebulization BID   carvedilol  12.5 mg Per Tube BID WC   Chlorhexidine Gluconate Cloth  6 each Topical Q0600   clopidogrel  75 mg Oral Daily   escitalopram  10 mg Per Tube Daily   feeding supplement  237 mL Oral TID WC   feeding supplement (OSMOLITE 1.5 CAL)  1,000 mL Per Tube Q24H   feeding supplement (PROSource TF20)  60 mL Per Tube Daily   Gerhardt's butt cream   Topical BID   heparin injection (subcutaneous)  5,000 Units Subcutaneous Q8H   insulin aspart  0-15 Units Subcutaneous Q4H   insulin aspart  2 Units Subcutaneous TID WC   insulin glargine-yfgn  5 Units Subcutaneous Daily   metroNIDAZOLE  500 mg Per Tube Q12H   mirtazapine  7.5 mg Per Tube QHS   multivitamin with minerals  1 tablet Per Tube Daily   nicotine  14 mg Transdermal Daily   mouth rinse  15 mL Mouth Rinse Q4H   oxyCODONE  10 mg Oral Q12H   pantoprazole  40 mg Oral Daily   revefenacin  175 mcg Nebulization Daily   senna-docusate  1 tablet Oral QHS   sodium chloride flush  10-40 mL Intracatheter Q12H   Continuous Infusions:   ceFAZolin (ANCEF) IV 2 g (09/01/22 0311)   Diet Order             DIET DYS 3 Room service appropriate? Yes with Assist; Fluid consistency: Thin  Diet effective now                   Intake/Output Summary (Last 24 hours) at 09/01/2022 0945 Last data filed at 09/01/2022 0131 Gross per 24 hour  Intake 300 ml  Output 675 ml  Net -375 ml   Net IO Since Admission: 6,677.22 mL [09/01/22 0945]  Wt Readings from Last 3 Encounters:  08/29/22 66.2 kg  08/03/22 58.7 kg  07/01/22 54.4 kg     Unresulted Labs (From admission, onward)     Start     Ordered   09/02/22 1324  Basic metabolic panel  Daily,   R     Question:  Specimen collection method  Answer:  Lab=Lab collect   09/01/22 0809   09/02/22 0500  CBC  Daily,   R     Question:  Specimen collection method  Answer:  Lab=Lab collect    09/01/22 0809          Data Reviewed: I have personally reviewed following labs and imaging studies CBC: Recent Labs  Lab 08/27/22 0102 08/28/22 0340 08/29/22 0500 08/30/22 1133 08/31/22 0103  WBC 8.8 10.9* 9.2 8.1 7.4  NEUTROABS 6.0 8.0* 6.3 5.8 4.8  HGB 7.8* 9.4* 8.0* 8.4* 8.1*  HCT 25.9* 31.1* 26.6* 27.6* 25.5*  MCV 96.3 96.6 94.7 95.2 93.8  PLT 537* 631* 504* 500* 401*   Basic Metabolic Panel: Recent Labs  Lab 08/28/22 0340 08/31/22 0059  NA 136 136  K 4.4 4.2  CL 98 99  CO2 30 28  GLUCOSE 232* 142*  BUN 36* 17  CREATININE 0.71 0.74  CALCIUM 8.9 8.6*   GFR: Estimated Creatinine Clearance: 56.4 mL/min (by C-G formula based on SCr of 0.74 mg/dL). Liver Function Tests: Recent Labs  Lab 08/28/22 0340  AST 19  ALT 6  ALKPHOS 101  BILITOT 0.3  PROT 6.2*  ALBUMIN 2.1*  CBG: Recent Labs  Lab 08/31/22 1605 08/31/22 1949 08/31/22 2335 09/01/22 0320 09/01/22 0817  GLUCAP 150* 194* 274* 333* 194*  No results found for this or any previous visit (from the past 240 hour(s)).  Antimicrobials: Anti-infectives (From admission, onward)    Start     Dose/Rate Route Frequency Ordered Stop   08/20/22 2200  metroNIDAZOLE (FLAGYL) IVPB 500 mg  Status:  Discontinued        500 mg 100 mL/hr over 60 Minutes Intravenous Every 12 hours 08/20/22 1110 08/20/22 1111   08/20/22 2200  metroNIDAZOLE (FLAGYL) tablet 500 mg        500 mg Per Tube Every 12 hours 08/20/22 1111     08/18/22 2300  ceFAZolin (ANCEF) IVPB 2g/100 mL premix        2 g 200 mL/hr over 30 Minutes Intravenous Every 8 hours 08/18/22 1455     08/18/22 2300  metroNIDAZOLE (FLAGYL) IVPB 500 mg  Status:  Discontinued        500 mg 100 mL/hr over 60 Minutes Intravenous Every 6 hours 08/18/22 1455 08/20/22 1110   08/15/22 1100  vancomycin (VANCOCIN) IVPB 1000 mg/200 mL premix  Status:  Discontinued        1,000 mg 200 mL/hr over 60 Minutes Intravenous Every 24 hours 08/14/22 1006 08/15/22 1307  08/14/22  1400  piperacillin-tazobactam (ZOSYN) IVPB 3.375 g  Status:  Discontinued        3.375 g 12.5 mL/hr over 240 Minutes Intravenous Every 8 hours 08/14/22 1006 08/18/22 1455   08/14/22 1100  vancomycin (VANCOREADY) IVPB 1500 mg/300 mL        1,500 mg 150 mL/hr over 120 Minutes Intravenous  Once 08/14/22 1006 08/14/22 1502   08/04/22 1045  ceFAZolin (ANCEF) IVPB 2g/100 mL premix        2 g 200 mL/hr over 30 Minutes Intravenous On call to O.R. 08/04/22 0958 08/04/22 1100      Culture/Microbiology    Component Value Date/Time   SDES WOUND 08/14/2022 0836   SPECREQUEST GROIN 08/14/2022 0836   CULT  08/14/2022 0836    MODERATE ESCHERICHIA COLI RARE PROTEUS MIRABILIS MODERATE BACTEROIDES OVATUS MODERATE BACTEROIDES STERCORIS BETA LACTAMASE POSITIVE Performed at [facility], [place] [address], [place], [place] [address]    REPTSTATUS 08/17/2022 FINAL 08/14/2022 1448    Radiology Studies: No results found.   LOS: 28 days   Antonieta Pert, MD [facility]  09/01/2022, 9:45 AM

## 2022-09-01 NOTE — Hospital Course (Addendum)
72 y.o. female with PMH significant for DM2, neuropathy, HTN, HLD, COPD, GERD, PAD, AS s/p TAVR, chronic diastolic CHF, anxiety/depression/schizophrenia, tobacco abuse. 12/11, she presented to ED with sudden onset of dizziness, blurred vision, headache, nausea and vomiting. Blood pressure was significantly elevated to over 008 systolic with EMS. She was admitted to ICU for hemorrhagic stroke and bilateral lower extremity acute on chronic ischemia.Vascular surgery was consulted and s/p bilateral lower extremity femoral endarterectomy and patch angioplasty with iliac stenting, 4 compartment fasciotomies for acute on chronic lower extremity ischemia. Postop remained on vent and Cleviprex.Transferred to floor and TRH on 12/17. Hypoglycemic 08/30/2022 with blood sugar in the 50s, insulin adjusted Oral intake picking up patient did not want to have lumbar NG tube/feeding core track removed 1/10 Patient remains weak deconditioned unable to mobilize, needing wound care with [facility] change Monday Wednesday Friday Insurance has denied LTACH despite peer-to-peer review and approved for skilled nursing facility which family has agreed.

## 2022-09-01 NOTE — Progress Notes (Addendum)
Occupational Therapy Treatment Patient Details Name: Heather [name] MRN: [id number] DOB: 10/17/1950 Today's Date: 09/01/2022   History of present illness Pt is a 72 y.o. female admitted 08/04/22 with headache, dizziness. 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  Found to have mottled LE's after given Narcdipine due to HTN; s/p emergent common femoral and iliofemoral endarterectomy and bilateral 4 compartment fasciotomies on 12/11. 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 PMH includes smoker(2ppd), severe AI/AS s/p TAVR, CAD s/p PCI and on DAPT, COPD, PAD, HTN.   OT comments  Pt progressing towards goals this session, needing mod A for supine grooming task at bed level. Pt performing UB/LB AROM at bed level, needing mod A for bed mobility with attempts to sit EOB, also reporting increased dizziness, BP 133/45 (70). Pt presenting with impairments listed below, will follow acutely. Continue to recommend SNF at d/c.   Recommendations for follow up therapy are one component of a multi-disciplinary discharge planning process, led by the attending physician.  Recommendations may be updated based on patient status, additional functional criteria and insurance authorization.    Follow Up Recommendations  Skilled nursing-short term rehab (<3 hours/day)     Assistance Recommended at Discharge Frequent or constant Supervision/Assistance  Patient can return home with the following  Two people to help with walking and/or transfers;A lot of help with bathing/dressing/bathroom;Assistance with cooking/housework;Assistance with feeding;Direct supervision/assist for medications management;Direct supervision/assist for financial management;Assist for transportation;Help with stairs or ramp for entrance   Equipment Recommendations  Other (comment) (defer)    Recommendations for Other Services PT consult    Precautions / Restrictions Precautions Precautions: Fall;Other  (comment) Precaution Comments: bilateral groin wound vac; watch SpO2; bowel incontinence; multiple buttocks wounds Restrictions Weight Bearing Restrictions: Yes RLE Weight Bearing: Weight bearing as tolerated LLE Weight Bearing: Non weight bearing       Mobility Bed Mobility Overal bed mobility: Needs Assistance       Supine to sit: Mod assist Sit to supine: Mod assist   General bed mobility comments: pt unable to balance sitting EOB due to mattress, and unable to tolerate mattress deflated    Transfers                   General transfer comment: deferred     Balance Overall balance assessment: Needs assistance Sitting-balance support: Bilateral upper extremity supported, Feet unsupported Sitting balance-Leahy Scale: Poor                                     ADL either performed or assessed with clinical judgement   ADL Overall ADL's : Needs assistance/impaired     Grooming: Brushing hair;Moderate assistance Grooming Details (indicate cue type and reason): semi reclined in bed                                    Extremity/Trunk Assessment Upper Extremity Assessment Upper Extremity Assessment: Generalized weakness   Lower Extremity Assessment Lower Extremity Assessment: Defer to PT evaluation        Vision   Vision Assessment?: No apparent visual deficits   Perception Perception Perception: Not tested   Praxis Praxis Praxis: Not tested    Cognition Arousal/Alertness: Awake/alert Behavior During Therapy: Flat affect Overall Cognitive Status: No family/caregiver present to determine baseline cognitive functioning  Area of Impairment: Attention, Following commands, Safety/judgement, Awareness, Problem solving                   Current Attention Level: Selective   Following Commands: Follows one step commands consistently Safety/Judgement: Decreased awareness of deficits Awareness: Emergent Problem Solving:  Requires verbal cues, Slow processing General Comments: pt verbalizing desire to "do anything to get out of this hospital and go home", decreased awareness of deficits and current level of assist needed        Exercises General Exercises - Upper Extremity Shoulder Flexion: AROM, 5 reps, Both, Supine Elbow Flexion: AROM, Both, 10 reps, Supine Elbow Extension: AROM, Both, 10 reps, Supine Digit Composite Flexion: AROM, Both, 10 reps, Supine Composite Extension: AROM, Both, 10 reps, Supine General Exercises - Lower Extremity Ankle Circles/Pumps: Right, 10 reps, Supine Heel Slides: AAROM, Right, 5 reps, Supine    Shoulder Instructions       General Comments VSS on RA    Pertinent Vitals/ Pain       Pain Assessment Pain Assessment: Faces Pain Score: 8  Pain Location: legs Pain Descriptors / Indicators: Grimacing, Guarding, Discomfort Pain Intervention(s): Limited activity within patient's tolerance, Monitored during session, Repositioned  Home Living                                          Prior Functioning/Environment              Frequency  Min 2X/week        Progress Toward Goals  OT Goals(current goals can now be found in the care plan section)  Progress towards OT goals: Progressing toward goals  Acute Rehab OT Goals Patient Stated Goal: to get better OT Goal Formulation: With patient Time For Goal Achievement: 09/09/22 Potential to Achieve Goals: Good ADL Goals Pt Will Perform Lower Body Bathing: with min assist;sitting/lateral leans;sit to/from stand;with adaptive equipment Pt Will Perform Lower Body Dressing: with min assist;sit to/from stand;sitting/lateral leans;with adaptive equipment Pt Will Transfer to Toilet: with min assist;bedside commode;squat pivot transfer;with transfer board;with +2 assist Pt Will Perform Toileting - Clothing Manipulation and hygiene: with min assist;sitting/lateral leans Additional ADL Goal #1: Pt will  complete bed mobility with min assist and maintain sitting balance dynamically for 5 minutes as precursor to ADLs.  Plan Discharge plan remains appropriate;Frequency remains appropriate    Co-evaluation                 AM-PAC OT "6 Clicks" Daily Activity     Outcome Measure   Help from another person eating meals?: A Little Help from another person taking care of personal grooming?: A Little Help from another person toileting, which includes using toliet, bedpan, or urinal?: Total Help from another person bathing (including washing, rinsing, drying)?: A Lot Help from another person to put on and taking off regular upper body clothing?: A Little Help from another person to put on and taking off regular lower body clothing?: Total 6 Click Score: 13    End of Session Equipment Utilized During Treatment: Oxygen (2L)  OT Visit Diagnosis: Unsteadiness on feet (R26.81);Other abnormalities of gait and mobility (R26.89);Muscle weakness (generalized) (M62.81);Pain   Activity Tolerance Patient tolerated treatment well;Patient limited by pain   Patient Left in bed;with call bell/phone within reach;with bed alarm set;with nursing/sitter in room   Nurse Communication Mobility status  Time: 9147-8295 OT Time Calculation (min): 33 min  Charges: OT General Charges $OT Visit: 1 Visit OT Treatments $Self Care/Home Management : 8-22 mins $Therapeutic Exercise: 8-22 mins  Renaye Rakers, OTD, OTR/L SecureChat Preferred Acute Rehab [phone]   Ulla Gallo 09/01/2022, 4:15 PM

## 2022-09-01 NOTE — Progress Notes (Addendum)
Progress Note    09/01/2022 6:45 AM 18 Days Post-Op  Subjective:  c/o pain in the right foot.  Her foot is laying on the clamps for the wound vac.    afebrile  Vitals:   08/31/22 2305 09/01/22 0322  BP: (!) 130/45 120/61  Pulse: 85 70  Resp: 14 11  Temp: 98.9 F (37.2 C) 98.9 F (37.2 C)  SpO2: (!) 81% 96%    Physical Exam: General:  no distress Cardiac:  regular Lungs:  non labored Incisions:    Right groin   Left groin   Left AKA stump   Extremities:  + brisk doppler flow right DP  Left medial thigh   Abdomen:  soft  CBC    Component Value Date/Time   WBC 7.4 08/31/2022 0103   RBC 2.72 (L) 08/31/2022 0103   HGB 8.1 (L) 08/31/2022 0103   HGB 14.3 06/20/2012 2127   HCT 25.5 (L) 08/31/2022 0103   HCT 42.9 06/20/2012 2127   PLT 470 (H) 08/31/2022 0103   PLT 357 06/20/2012 2127   MCV 93.8 08/31/2022 0103   MCV 91 06/20/2012 2127   MCH 29.8 08/31/2022 0103   MCHC 31.8 08/31/2022 0103   RDW 15.0 08/31/2022 0103   RDW 13.6 06/20/2012 2127   LYMPHSABS 1.3 08/31/2022 0103   MONOABS 0.9 08/31/2022 0103   EOSABS 0.3 08/31/2022 0103   BASOSABS 0.1 08/31/2022 0103    BMET    Component Value Date/Time   NA 136 08/31/2022 0059   NA 135 (L) 06/20/2012 2127   K 4.2 08/31/2022 0059   K 4.0 06/20/2012 2127   CL 99 08/31/2022 0059   CL 100 06/20/2012 2127   CO2 28 08/31/2022 0059   CO2 27 06/20/2012 2127   GLUCOSE 142 (H) 08/31/2022 0059   GLUCOSE 250 (H) 06/20/2012 2127   BUN 17 08/31/2022 0059   BUN 20 (H) 06/20/2012 2127   CREATININE 0.74 08/31/2022 0059   CREATININE 0.60 06/20/2012 2127   CALCIUM 8.6 (L) 08/31/2022 0059   CALCIUM 8.8 06/20/2012 2127   GFRNONAA >60 08/31/2022 0059   GFRNONAA >60 06/20/2012 2127   GFRAA >60 06/20/2012 2127    INR    Component Value Date/Time   INR 1.0 08/03/2022 2156     Intake/Output Summary (Last 24 hours) at 09/01/2022 0645 Last data filed at 09/01/2022 0131 Gross per 24 hour  Intake 300 ml  Output  675 ml  Net -375 ml      Assessment/Plan:  72 y.o. female is s/p:  Left AKA and excisional debridement of bilateral groin wounds with vac placement  18 Days Post-Op And Bilateral CFA endarterectomy with bovine patch angioplasty, bilateral iliofemoral embolectomy, stenting bilateral CIA, stent right EIA and 4 compartment fasciotomies   27 Days Post-Op   -pt with brisk doppler flow right DP; staples in tact fasciotomies-these most likely can be removed today.   -wound vacs off bilateral groins-wounds clean.  WOC to replace vacs later this morning. Continue M/W/F vac changes -continue plavix/statin -Ancef q8h and Flagyl bid for E coli and Proteus mirabilis on wound culture    Leontine Locket, PA-C [facility] [phone] 09/01/2022 6:45 AM  I have independently interviewed and examined patient and agree with PA assessment and plan above. Groins healing well by above pictures, wound vacs have been replaced. Strong R pt signal. R leg staples ok to remove prior to dc but will leave L aka staples another 2 weeks approximately.  Tishia Maestre C. Donzetta Matters,  MD [facility] [place] Office: [phone] Pager: [phone]

## 2022-09-01 NOTE — Progress Notes (Signed)
SLP Cancellation Note  Patient Details Name: Heather [name] MRN: [id number] DOB: [birth year]   Cancelled treatment:        Attempted to see pt for ongoing cognitive therapy.  Pt lethargic, but agreeable to treatment.  Pt had difficulty maintaining alertness.  She participated in problem solving x2 with poor attention, no benefit of cuing.  She then requested that we do this another day.    Celedonio Savage, [place], [place] Office: [phone] 09/01/2022, 11:34 AM

## 2022-09-02 DIAGNOSIS — I614 Nontraumatic intracerebral hemorrhage in cerebellum: Secondary | ICD-10-CM | POA: Diagnosis not present

## 2022-09-02 LAB — CBC
HCT: 28.4 % — ABNORMAL LOW (ref 36.0–46.0)
Hemoglobin: 9 g/dL — ABNORMAL LOW (ref 12.0–15.0)
MCH: 29.6 pg (ref 26.0–34.0)
MCHC: 31.7 g/dL (ref 30.0–36.0)
MCV: 93.4 fL (ref 80.0–100.0)
Platelets: 454 10*3/uL — ABNORMAL HIGH (ref 150–400)
RBC: 3.04 MIL/uL — ABNORMAL LOW (ref 3.87–5.11)
RDW: 14.6 % (ref 11.5–15.5)
WBC: 6.5 10*3/uL (ref 4.0–10.5)
nRBC: 0 % (ref 0.0–0.2)

## 2022-09-02 LAB — BASIC METABOLIC PANEL
Anion gap: 9 (ref 5–15)
BUN: 12 mg/dL (ref 8–23)
CO2: 29 mmol/L (ref 22–32)
Calcium: 8.9 mg/dL (ref 8.9–10.3)
Chloride: 98 mmol/L (ref 98–111)
Creatinine, Ser: 0.67 mg/dL (ref 0.44–1.00)
GFR, Estimated: >60 mL/min (ref >60)
Glucose, Bld: 146 mg/dL — ABNORMAL HIGH (ref 70–99)
Potassium: 3.7 mmol/L (ref 3.5–5.1)
Sodium: 136 mmol/L (ref 135–145)

## 2022-09-02 LAB — GLUCOSE, CAPILLARY
Glucose-Capillary: 131 mg/dL — ABNORMAL HIGH (ref 70–99)
Glucose-Capillary: 147 mg/dL — ABNORMAL HIGH (ref 70–99)
Glucose-Capillary: 152 mg/dL — ABNORMAL HIGH (ref 70–99)
Glucose-Capillary: 152 mg/dL — ABNORMAL HIGH (ref 70–99)
Glucose-Capillary: 202 mg/dL — ABNORMAL HIGH (ref 70–99)
Glucose-Capillary: 216 mg/dL — ABNORMAL HIGH (ref 70–99)

## 2022-09-02 NOTE — Progress Notes (Signed)
Per [facility] liaison [employer] [place] still pending for [facility]

## 2022-09-02 NOTE — Progress Notes (Signed)
Physical Therapy Treatment Patient Details Name: Heather [name] MRN: [id number] DOB: [birth year] Today's Date: 09/02/2022   History of Present Illness Pt is a 72 y.o. female admitted 08/04/22 with headache, dizziness. 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  Found to have mottled LE's after given Narcdipine due to HTN; s/p emergent common femoral and iliofemoral endarterectomy and bilateral 4 compartment fasciotomies on 12/11. 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 PMH includes smoker(2ppd), severe AI/AS s/p TAVR, CAD s/p PCI and on DAPT, COPD, PAD, HTN.    PT Comments    Treatment session limited by pt nausea/vomiting once sitting up on edge of bed; RN notified. Pt requesting to return to supine. Pt continues with generalized weakness, poor sitting balance, impaired cognition, and decreased activity tolerance. Continue to recommend SNF for ongoing Physical Therapy.      Recommendations for follow up therapy are one component of a multi-disciplinary discharge planning process, led by the attending physician.  Recommendations may be updated based on patient status, additional functional criteria and insurance authorization.  Follow Up Recommendations  Skilled nursing-short term rehab (<3 hours/day) Can patient physically be transported by private vehicle: No   Assistance Recommended at Discharge Frequent or constant Supervision/Assistance  Patient can return home with the following Two people to help with walking and/or transfers;Two people to help with bathing/dressing/bathroom;Help with stairs or ramp for entrance;Assist for transportation;Assistance with cooking/housework   Equipment Recommendations  Other (comment) (defer)    Recommendations for Other Services       Precautions / Restrictions Precautions Precautions: Fall;Other (comment) Precaution Comments: bilateral groin wound vac; watch SpO2; bowel incontinence; multiple buttocks  wounds Restrictions Weight Bearing Restrictions: Yes RLE Weight Bearing: Weight bearing as tolerated LLE Weight Bearing: Non weight bearing     Mobility  Bed Mobility Overal bed mobility: Needs Assistance       Supine to sit: Mod assist Sit to supine: Max assist, +2 for safety/equipment   General bed mobility comments: Pt requiring increased assist to return to bed due to vomiting and sliding forward anteriorly 2/2 air mattress    Transfers                   General transfer comment: deferred    Ambulation/Gait                   Stairs             Wheelchair Mobility    Modified Rankin (Stroke Patients Only)       Balance Overall balance assessment: Needs assistance Sitting-balance support: Bilateral upper extremity supported, Feet unsupported Sitting balance-Leahy Scale: Poor                                      Cognition Arousal/Alertness: Awake/alert Behavior During Therapy: Flat affect Overall Cognitive Status: No family/caregiver present to determine baseline cognitive functioning Area of Impairment: Attention, Following commands, Safety/judgement, Awareness, Problem solving                   Current Attention Level: Selective   Following Commands: Follows one step commands consistently Safety/Judgement: Decreased awareness of deficits Awareness: Emergent Problem Solving: Requires verbal cues, Slow processing          Exercises      General Comments        Pertinent Vitals/Pain Pain Assessment Pain  Assessment: Faces Faces Pain Scale: Hurts little more Pain Location: generalized Pain Descriptors / Indicators: Grimacing, Guarding, Discomfort Pain Intervention(s): Limited activity within patient's tolerance, Monitored during session    Home Living                          Prior Function            PT Goals (current goals can now be found in the care plan section) Acute Rehab PT  Goals Patient Stated Goal: to get better Time For Goal Achievement: 09/16/22 Potential to Achieve Goals: Poor    Frequency    Min 2X/week      PT Plan Current plan remains appropriate    Co-evaluation              AM-PAC PT "6 Clicks" Mobility   Outcome Measure  Help needed turning from your back to your side while in a flat bed without using bedrails?: A Lot Help needed moving from lying on your back to sitting on the side of a flat bed without using bedrails?: A Lot Help needed moving to and from a bed to a chair (including a wheelchair)?: Total Help needed standing up from a chair using your arms (e.g., wheelchair or bedside chair)?: Total Help needed to walk in hospital room?: Total Help needed climbing 3-5 steps with a railing? : Total 6 Click Score: 8    End of Session Equipment Utilized During Treatment: Oxygen Activity Tolerance: Other (comment) (limited by nausea/vomiting) Patient left: in bed;with call bell/phone within reach Nurse Communication: Mobility status PT Visit Diagnosis: Muscle weakness (generalized) (M62.81);Pain;Other abnormalities of gait and mobility (R26.89) Pain - Right/Left: Left Pain - part of body: Ankle and joints of foot     Time: 2993-7169 PT Time Calculation (min) (ACUTE ONLY): 22 min  Charges:  $Therapeutic Activity: 8-22 mins                     Heather [name], PT, DPT Acute Rehabilitation Services Office [phone]    Heather [name] 09/02/2022, 2:19 PM

## 2022-09-02 NOTE — Progress Notes (Addendum)
  Progress Note    09/02/2022 6:49 AM 19 Days Post-Op  Subjective:  no complaints  Afebrile   Vitals:   09/02/22 0030 09/02/22 0346  BP: 124/89 133/88  Pulse: 81 78  Resp: 18 16  Temp: 98.3 F (36.8 C) 98.7 F (37.1 C)  SpO2: 92% 90%    Physical Exam: General:  no distress Lungs:  non labored Incisions:  bilateral groins with vac in placed.  Extremities:  right foot warm and well perfused   CBC    Component Value Date/Time   WBC 7.4 08/31/2022 0103   RBC 2.72 (L) 08/31/2022 0103   HGB 8.1 (L) 08/31/2022 0103   HGB 14.3 06/20/2012 2127   HCT 25.5 (L) 08/31/2022 0103   HCT 42.9 06/20/2012 2127   PLT 470 (H) 08/31/2022 0103   PLT 357 06/20/2012 2127   MCV 93.8 08/31/2022 0103   MCV 91 06/20/2012 2127   MCH 29.8 08/31/2022 0103   MCHC 31.8 08/31/2022 0103   RDW 15.0 08/31/2022 0103   RDW 13.6 06/20/2012 2127   LYMPHSABS 1.3 08/31/2022 0103   MONOABS 0.9 08/31/2022 0103   EOSABS 0.3 08/31/2022 0103   BASOSABS 0.1 08/31/2022 0103    BMET    Component Value Date/Time   NA 136 08/31/2022 0059   NA 135 (L) 06/20/2012 2127   K 4.2 08/31/2022 0059   K 4.0 06/20/2012 2127   CL 99 08/31/2022 0059   CL 100 06/20/2012 2127   CO2 28 08/31/2022 0059   CO2 27 06/20/2012 2127   GLUCOSE 142 (H) 08/31/2022 0059   GLUCOSE 250 (H) 06/20/2012 2127   BUN 17 08/31/2022 0059   BUN 20 (H) 06/20/2012 2127   CREATININE 0.74 08/31/2022 0059   CREATININE 0.60 06/20/2012 2127   CALCIUM 8.6 (L) 08/31/2022 0059   CALCIUM 8.8 06/20/2012 2127   GFRNONAA >60 08/31/2022 0059   GFRNONAA >60 06/20/2012 2127   GFRAA >60 06/20/2012 2127    INR    Component Value Date/Time   INR 1.0 08/03/2022 2156     Intake/Output Summary (Last 24 hours) at 09/02/2022 0649 Last data filed at 09/02/2022 0148 Gross per 24 hour  Intake 420 ml  Output 1975 ml  Net -1555 ml      Assessment/Plan:  72 y.o. female is s/p:  Left AKA and excisional debridement of bilateral groin wounds with vac  placement  19 Days Post-Op And Bilateral CFA endarterectomy with bovine patch angioplasty, bilateral iliofemoral embolectomy, stenting bilateral CIA, stent right EIA and 4 compartment fasciotomies   28 Days Post-Op   -pt right foot warm and well perfused -next vac change tomorrow with WOC.  Vac changes M/W/F -DVT prophylaxis:  sq heparin -continue plavix/statin -Ancef q8h and Flagyl bid for E coli and Proteus mirabilis on wound culture   Leontine Locket, PA-C [facility] [phone] 09/02/2022 6:49 AM   I have independently interviewed and examined patient and agree with PA assessment and plan above. Right foot well perfused. Vac changes tomorrow on abx for wounds with bovine patched arteries. Remove right leg fasciotomy staples prior to dc, left aka needs a couple more weeks.   Samin Milke C. Donzetta Matters, MD [facility] [place] Office: [phone] Pager: [phone]

## 2022-09-02 NOTE — Progress Notes (Signed)
PROGRESS NOTE Heather [name]  VOZ:[id number] DOB: [birth year] DOA: 08/04/2022 PCP: [name], FNP   Brief Narrative/Hospital Course:  72 y.o. female with PMH significant for DM2, neuropathy, HTN, HLD, COPD, GERD, PAD, AS s/p TAVR, chronic diastolic CHF, anxiety/depression/schizophrenia, tobacco abuse. 12/11, she presented to ED with sudden onset of dizziness, blurred vision, headache, nausea and vomiting. Blood pressure was significantly elevated to over 425 systolic with EMS. She was admitted to ICU for hemorrhagic stroke and bilateral lower extremity acute on chronic ischemia.Vascular surgery was consulted and s/p bilateral lower extremity femoral endarterectomy and patch angioplasty with iliac stenting, 4 compartment fasciotomies for acute on chronic lower extremity ischemia. Postop remained on vent and Cleviprex.Transferred to floor and TRH on 12/17. Hypoglycemic 08/30/2022 with blood sugar in the 50s, insulin adjusted  Subjective: Seen this am Eating at least 75%, wants cortrak out Wound vac and foley in place Overnight patient is afebrile.   On RA this am   Assessment and Plan: Principal Problem:   ICH (intracerebral hemorrhage) (HCC) Active Problems:   Abscess of anal and rectal regions   Decubitus ulcer of sacral region, stage 2 (HCC)   Pulmonary edema  Rhabdomyolysis Left AKA and excisional debridement of bilateral groin wounds with VAC placement 12/21 Bilateral CFA endarterectomy with bovine patch angioplasty Bilateral iliofemoral embolectomy, stenting Bilateral CIA, stent Rt EIA and 4 compartment fasciotomies: Followed by vascular surgery on board input appreciated.  Patient with baseline Doppler flow right DP, staples intact fasciotomies> to be removed 1/8, wound VAC of bilateral groins and wound appears clean.  Cont wound care with MWF VAC changes. Continue Plavix/statin. Continue Ancef and Flagyl for E. coli and Proteus [name] in wound culture Will need total of 4  to 6 weeks of antibiotics per vascular surgery.  Acute ZDG:LOVFIEP CT scan showed acute intraparenchymal hematoma within the cerebellar vermis measuring 11 cc in volume and mild mass effect upon the fourth ventricle. Confirmed with an MRI.  Seen by neurology.  Eventually placed on antiplatelet agents tolerating Plavix 75.  Acute respiratory failure with hypoxia COPD  Smoker : Extubated 12/14, continue supplemental oxygen and wean as tolerated. Cont Brovana Pulmicort,Yupleri and albuterol as needed   Chronic diastolic CHF HTN: BP table continue Coreg 12.5 twice daily, prn Hydralazine.  Monitor fluid status Echo EF 60 to 60% on 12/11 w/  indeterminate diastolic parameters. Net IO Since Admission: 4,747.22 mL [09/02/22 0942]   Intake/Output Summary (Last 24 hours) at 09/02/2022 0942 Last data filed at 09/02/2022 0800 Gross per 24 hour  Intake 300 ml  Output 2350 ml  Net -2050 ml     Type 2 diabetes mellitus with uncontrolled hyperglycemia A1c poorly controlled 12.9: On Semglee 5 units daily, 2 units Premeal Humalog and SSI.  It seems patient has been refusing insulin , her sugar is poorly controlled overall.,She has brittle DM. Recent Labs  Lab 09/01/22 1744 09/01/22 1951 09/02/22 0026 09/02/22 0342 09/02/22 0847  GLUCAP 143* 131* 152* 152* 131*     Hx of CAD HLD AS s/p TAVR: On Plavix Lipitor.     Acute blood loss anemia in the setting of surgery: Received 3 units PRBC so far.  Monitor as below Recent Labs  Lab 08/28/22 0340 08/29/22 0500 08/30/22 1133 08/31/22 0103 09/02/22 0709  HGB 9.4* 8.0* 8.4* 8.1* 9.0*  HCT 31.1* 26.6* 27.6* 25.5* 28.4*    Depression, bipolar, schizophrenia, insomnia: cont on Lexapro daily, Remeron nightly, trazodone nightly.,    Dysphagia:Likely related to [place].Has left nasal core track with  ongoing tube feeds.  Patient and family not willing to proceed to PEG tube feeding. Slp following, continue dysphagia diet> her oral intake appears to be  improving CIWA score tried to be removed requested dietitian to reevaluate today may be NGT can come out   Pressure injury stage II sacrum POA see below: Pressure Injury 08/04/22 Sacrum Stage 2 -  Partial thickness loss of dermis presenting as a shallow open injury with a red, pink wound bed without slough. (Active)  08/04/22 0100  Location: Sacrum  Location Orientation:   Staging: Stage 2 -  Partial thickness loss of dermis presenting as a shallow open injury with a red, pink wound bed without slough.  Wound Description (Comments):   Present on Admission: Yes  Dressing Type Foam - Lift dressing to assess site every shift 08/31/22 1950   DVT prophylaxis: heparin injection 5,000 Units Start: 08/10/22 1600 Code Status:   Code Status: Full Code Family Communication: plan of care discussed with patient at bedside. Patient status is: inpatient  because of poor po intake Level of care: Telemetry Medical   Dispo: The patient is from: Home, w/. Husband, he is here daily            Anticipated disposition: LTACH pending. Remains stable  Objective: Vitals last 24 hrs: Vitals:   09/02/22 0654 09/02/22 0813 09/02/22 0814 09/02/22 0815  BP:    (!) 156/60  Pulse: 72   85  Resp: 11   (!) 21  Temp:    98.3 F (36.8 C)  TempSrc:    Axillary  SpO2: (!) 88% 98% 100% 99%  Weight: 64.9 kg     Height:       Weight change:   Physical Examination: General exam: Aaox3, cortrak+, HEENT:Oral mucosa moist, Ear/Nose WNL grossly, dentition normal. Respiratory system: bilaterally diminished BS,no use of accessory muscle Cardiovascular system: S1 & S2 +, regular rate, JVD neg. Gastrointestinal system: Abdomen soft, lower abdomen w/ wound vac+, Foley+ ND,BS+ Nervous System:Alert, awake, moving extremities and grossly nonfocal Extremities: LE ankle edema neg, lt AKA, rt incision stes w/ staples+ wound clean dry Skin: No rashes,no icterus.  Medications reviewed:  Scheduled Meds:  arformoterol  15 mcg  Nebulization BID   atorvastatin  80 mg Per Tube Daily   budesonide (PULMICORT) nebulizer solution  0.5 mg Nebulization BID   carvedilol  12.5 mg Per Tube BID WC   Chlorhexidine Gluconate Cloth  6 each Topical Q0600   clopidogrel  75 mg Oral Daily   escitalopram  10 mg Per Tube Daily   feeding supplement  237 mL Oral TID WC   feeding supplement (OSMOLITE 1.5 CAL)  1,000 mL Per Tube Q24H   feeding supplement (PROSource TF20)  60 mL Per Tube Daily   Gerhardt's butt cream   Topical BID   heparin injection (subcutaneous)  5,000 Units Subcutaneous Q8H   insulin aspart  0-15 Units Subcutaneous Q4H   insulin aspart  2 Units Subcutaneous TID WC   insulin glargine-yfgn  5 Units Subcutaneous Daily   metroNIDAZOLE  500 mg Per Tube Q12H   mirtazapine  7.5 mg Per Tube QHS   multivitamin with minerals  1 tablet Per Tube Daily   nicotine  14 mg Transdermal Daily   mouth rinse  15 mL Mouth Rinse Q4H   oxyCODONE  10 mg Oral Q12H   pantoprazole  40 mg Oral Daily   revefenacin  175 mcg Nebulization Daily   senna-docusate  1 tablet Oral QHS  sodium chloride flush  10-40 mL Intracatheter Q12H   Continuous Infusions:   ceFAZolin (ANCEF) IV 2 g (09/02/22 0426)   Diet Order             DIET DYS 3 Room service appropriate? Yes with Assist; Fluid consistency: Thin  Diet effective now                   Intake/Output Summary (Last 24 hours) at 09/02/2022 0942 Last data filed at 09/02/2022 0800 Gross per 24 hour  Intake 300 ml  Output 2350 ml  Net -2050 ml   Net IO Since Admission: 4,747.22 mL [09/02/22 0942]  Wt Readings from Last 3 Encounters:  09/02/22 64.9 kg  08/03/22 58.7 kg  07/01/22 54.4 kg     Unresulted Labs (From admission, onward)     Start     Ordered   09/02/22 7253  Basic metabolic panel  Daily,   R     Question:  Specimen collection method  Answer:  Lab=Lab collect   09/01/22 0809   09/02/22 0500  CBC  Daily,   R     Question:  Specimen collection method  Answer:  Lab=Lab  collect   09/01/22 0809          Data Reviewed: I have personally reviewed following labs and imaging studies CBC: Recent Labs  Lab 08/27/22 0102 08/28/22 0340 08/29/22 0500 08/30/22 1133 08/31/22 0103 09/02/22 0709  WBC 8.8 10.9* 9.2 8.1 7.4 6.5  NEUTROABS 6.0 8.0* 6.3 5.8 4.8  --   HGB 7.8* 9.4* 8.0* 8.4* 8.1* 9.0*  HCT 25.9* 31.1* 26.6* 27.6* 25.5* 28.4*  MCV 96.3 96.6 94.7 95.2 93.8 93.4  PLT 537* 631* 504* 500* 470* 664*   Basic Metabolic Panel: Recent Labs  Lab 08/28/22 0340 08/31/22 0059 09/02/22 0709  NA 136 136 136  K 4.4 4.2 3.7  CL 98 99 98  CO2 '30 28 29  '$ GLUCOSE 232* 142* 146*  BUN 36* 17 12  CREATININE 0.71 0.74 0.67  CALCIUM 8.9 8.6* 8.9   GFR: Estimated Creatinine Clearance: 55.8 mL/min (by C-G formula based on SCr of 0.67 mg/dL). Liver Function Tests: Recent Labs  Lab 08/28/22 0340  AST 19  ALT 6  ALKPHOS 101  BILITOT 0.3  PROT 6.2*  ALBUMIN 2.1*  CBG: Recent Labs  Lab 09/01/22 1744 09/01/22 1951 09/02/22 0026 09/02/22 0342 09/02/22 0847  GLUCAP 143* 131* 152* 152* 131*  No results found for this or any previous visit (from the past 240 hour(s)).  Antimicrobials: Anti-infectives (From admission, onward)    Start     Dose/Rate Route Frequency Ordered Stop   08/20/22 2200  metroNIDAZOLE (FLAGYL) IVPB 500 mg  Status:  Discontinued        500 mg 100 mL/hr over 60 Minutes Intravenous Every 12 hours 08/20/22 1110 08/20/22 1111   08/20/22 2200  metroNIDAZOLE (FLAGYL) tablet 500 mg        500 mg Per Tube Every 12 hours 08/20/22 1111     08/18/22 2300  ceFAZolin (ANCEF) IVPB 2g/100 mL premix        2 g 200 mL/hr over 30 Minutes Intravenous Every 8 hours 08/18/22 1455     08/18/22 2300  metroNIDAZOLE (FLAGYL) IVPB 500 mg  Status:  Discontinued        500 mg 100 mL/hr over 60 Minutes Intravenous Every 6 hours 08/18/22 1455 08/20/22 1110   08/15/22 1100  vancomycin (VANCOCIN) IVPB 1000 mg/200 mL premix  Status:  Discontinued         1,000 mg 200 mL/hr over 60 Minutes Intravenous Every 24 hours 08/14/22 1006 08/15/22 1307   08/14/22 1400  piperacillin-tazobactam (ZOSYN) IVPB 3.375 g  Status:  Discontinued        3.375 g 12.5 mL/hr over 240 Minutes Intravenous Every 8 hours 08/14/22 1006 08/18/22 1455   08/14/22 1100  vancomycin (VANCOREADY) IVPB 1500 mg/300 mL        1,500 mg 150 mL/hr over 120 Minutes Intravenous  Once 08/14/22 1006 08/14/22 1502   08/04/22 1045  ceFAZolin (ANCEF) IVPB 2g/100 mL premix        2 g 200 mL/hr over 30 Minutes Intravenous On call to O.R. 08/04/22 0958 08/04/22 1100      Culture/Microbiology    Component Value Date/Time   SDES WOUND 08/14/2022 0836   SPECREQUEST GROIN 08/14/2022 0836   CULT  08/14/2022 0836    MODERATE ESCHERICHIA COLI RARE PROTEUS MIRABILIS MODERATE BACTEROIDES OVATUS MODERATE BACTEROIDES STERCORIS BETA LACTAMASE POSITIVE Performed at [facility], [place] [address], [place], [place] [address]    REPTSTATUS 08/17/2022 FINAL 08/14/2022 5885    Radiology Studies: No results found.   LOS: 29 days   Antonieta Pert, MD [facility]  09/02/2022, 9:42 AM

## 2022-09-02 NOTE — Progress Notes (Signed)
Bedside check in with patient.  Alert and oriented to self. no complaints, denies acute pain.  Verbalizes continue with goals as they are with hopeful discharge to [facility] post acute hospitalization.  No pending discharge plans at this point.  Will continue to follow and support as needed.  Please contact PMT for any palliative needs prior to discharge.  Kizzie Fantasia, MSN, RN-BC, [facility], [facility] Palliative Clinical Specialist [facility]

## 2022-09-03 DIAGNOSIS — I614 Nontraumatic intracerebral hemorrhage in cerebellum: Secondary | ICD-10-CM | POA: Diagnosis not present

## 2022-09-03 LAB — CBC
HCT: 28 % — ABNORMAL LOW (ref 36.0–46.0)
Hemoglobin: 8.9 g/dL — ABNORMAL LOW (ref 12.0–15.0)
MCH: 29.2 pg (ref 26.0–34.0)
MCHC: 31.8 g/dL (ref 30.0–36.0)
MCV: 91.8 fL (ref 80.0–100.0)
Platelets: 447 10*3/uL — ABNORMAL HIGH (ref 150–400)
RBC: 3.05 MIL/uL — ABNORMAL LOW (ref 3.87–5.11)
RDW: 14.8 % (ref 11.5–15.5)
WBC: 7.2 10*3/uL (ref 4.0–10.5)
nRBC: 0 % (ref 0.0–0.2)

## 2022-09-03 LAB — GLUCOSE, CAPILLARY
Glucose-Capillary: 107 mg/dL — ABNORMAL HIGH (ref 70–99)
Glucose-Capillary: 115 mg/dL — ABNORMAL HIGH (ref 70–99)
Glucose-Capillary: 130 mg/dL — ABNORMAL HIGH (ref 70–99)
Glucose-Capillary: 133 mg/dL — ABNORMAL HIGH (ref 70–99)
Glucose-Capillary: 143 mg/dL — ABNORMAL HIGH (ref 70–99)
Glucose-Capillary: 151 mg/dL — ABNORMAL HIGH (ref 70–99)
Glucose-Capillary: 232 mg/dL — ABNORMAL HIGH (ref 70–99)
Glucose-Capillary: 98 mg/dL (ref 70–99)

## 2022-09-03 LAB — BASIC METABOLIC PANEL
Anion gap: 5 (ref 5–15)
BUN: 11 mg/dL (ref 8–23)
CO2: 30 mmol/L (ref 22–32)
Calcium: 8.7 mg/dL — ABNORMAL LOW (ref 8.9–10.3)
Chloride: 102 mmol/L (ref 98–111)
Creatinine, Ser: 0.78 mg/dL (ref 0.44–1.00)
GFR, Estimated: >60 mL/min (ref >60)
Glucose, Bld: 119 mg/dL — ABNORMAL HIGH (ref 70–99)
Potassium: 3.5 mmol/L (ref 3.5–5.1)
Sodium: 137 mmol/L (ref 135–145)

## 2022-09-03 NOTE — Progress Notes (Signed)
Brief Nutrition Note  New consult received for recommendations on adjusting pt from enteral feeds to PO. Reviewed chart and noted that pt was adjusted to 12h TF on 1/5 but has consistently refused them, prosource, and ensure since that time. Tube is not being utilized for nutrition due to non-compliance with regimen from pt. Intake recorded in chart is not adequate to meet nutrition needs.   Discussed with MD and RN. Since pt is not allowing tube to be utilized for feeds, would be in favor of removing to see if PO intake improves. Pt has been educated by previous RD on the reason for feeds and the importance of nutrition in wound healing.   Will follow-up as planned and trial new nutrition interventions if PO intake does not improve with removal of tube.   Ranell Patrick, RD, LDN Clinical Dietitian RD pager # available in [place]  After hours/weekend pager # available in [facility] [place]

## 2022-09-03 NOTE — Progress Notes (Addendum)
  Progress Note    09/03/2022 6:40 AM 20 Days Post-Op  Subjective:  sleeping  afebrile  Vitals:   09/03/22 0009 09/03/22 0315  BP: (!) 134/49 (!) 116/41  Pulse: 71 75  Resp: 15 13  Temp: 98.6 F (37 C) 98.5 F (36.9 C)  SpO2: 96% 96%    Physical Exam: General:  no distress Lungs:  non labored Incisions:  right fasciotomy incisions are clean with staples in tact Extremities:  right foot is warm and well perfused   CBC    Component Value Date/Time   WBC 7.2 09/03/2022 0054   RBC 3.05 (L) 09/03/2022 0054   HGB 8.9 (L) 09/03/2022 0054   HGB 14.3 06/20/2012 2127   HCT 28.0 (L) 09/03/2022 0054   HCT 42.9 06/20/2012 2127   PLT 447 (H) 09/03/2022 0054   PLT 357 06/20/2012 2127   MCV 91.8 09/03/2022 0054   MCV 91 06/20/2012 2127   MCH 29.2 09/03/2022 0054   MCHC 31.8 09/03/2022 0054   RDW 14.8 09/03/2022 0054   RDW 13.6 06/20/2012 2127   LYMPHSABS 1.3 08/31/2022 0103   MONOABS 0.9 08/31/2022 0103   EOSABS 0.3 08/31/2022 0103   BASOSABS 0.1 08/31/2022 0103    BMET    Component Value Date/Time   NA 137 09/03/2022 0054   NA 135 (L) 06/20/2012 2127   K 3.5 09/03/2022 0054   K 4.0 06/20/2012 2127   CL 102 09/03/2022 0054   CL 100 06/20/2012 2127   CO2 30 09/03/2022 0054   CO2 27 06/20/2012 2127   GLUCOSE 119 (H) 09/03/2022 0054   GLUCOSE 250 (H) 06/20/2012 2127   BUN 11 09/03/2022 0054   BUN 20 (H) 06/20/2012 2127   CREATININE 0.78 09/03/2022 0054   CREATININE 0.60 06/20/2012 2127   CALCIUM 8.7 (L) 09/03/2022 0054   CALCIUM 8.8 06/20/2012 2127   GFRNONAA >60 09/03/2022 0054   GFRNONAA >60 06/20/2012 2127   GFRAA >60 06/20/2012 2127    INR    Component Value Date/Time   INR 1.0 08/03/2022 2156     Intake/Output Summary (Last 24 hours) at 09/03/2022 0640 Last data filed at 09/02/2022 1600 Gross per 24 hour  Intake --  Output 1475 ml  Net -1475 ml      Assessment/Plan:  72 y.o. female is s/p:  Left AKA and excisional debridement of bilateral  groin wounds with vac placement  20 Days Post-Op And Bilateral CFA endarterectomy with bovine patch angioplasty, bilateral iliofemoral embolectomy, stenting bilateral CIA, stent right EIA and 4 compartment fasciotomies   29 Days Post-Op   -for vac change today with WOC.  Continue M/W/F vac changes -DVT prophylaxis:  sq heparin -discussed with RN to remove staples from right leg fasciotomy sites but leave left AKA staples.  -continue plavix/statin -Ancef q8h and Flagyl bid for E coli and Proteus mirabilis on wound culture -f/u in our office in 3 weeks (unless she is at [facility] - if she is there, we can go there and remove staples) for staple removal of left AKA and wound check.  Our office will arrange appt.     Leontine Locket, PA-C [facility] [phone] 09/03/2022 6:40 AM   I have independently interviewed patient and agree with PA assessment and plan above.   Anwar Crill C. Donzetta Matters, MD [facility] [place] Office: [phone] Pager: [phone]

## 2022-09-03 NOTE — Progress Notes (Signed)
This chaplain is present for F/U spiritual care and creating an Advance Directive. The chaplain understands the Pt. chooses to keep her husband as her surrogate decision maker.   The chaplain listens reflectively as the Pt. talks about d/c in about a week. The chaplain understands the Pt. is requesting  TOC resources for home healthcare assistance.   The Pt. shares she will update the chaplain as needed.  Chaplain Sallyanne Kuster [phone]

## 2022-09-03 NOTE — Consult Note (Addendum)
  [place] Nurse wound follow up;  Patient premedicated with pain medication, tolerated procedure well.  Wound type:bilateral groin wounds connected to NPWT with Y connector  Wound bed: L groin 90% Beefy red with 10% white fibrinous tissue present.  R groin 95% beefy red with 5% white fibrinous tissue present.  White foam to base of both wounds, top with black foam.  Drainage (amount, consistency, odor) moderate serosanguinous  .  Periwound: intact  wounds in crease of leg, apply barrier ring to periwound to promote seal.  Dressing procedure/placement/frequency: 1 piece white and 1 piece black foam removed, wound cleansed and 1 piece white and 1 piece black replaced into each wound. Covered with drape and TRAC pad.  Seal achieved at 125 mmHg,   WOC to change Friday 09/05/2022.   Will follow.  Thank you,   Latish Toutant MSN, RN-BC, [employer]

## 2022-09-03 NOTE — Progress Notes (Signed)
PROGRESS NOTE Heather [name]  OEU:[id number] DOB: Sep 04, 1950 DOA: 08/04/2022 PCP: [name], FNP   Brief Narrative/Hospital Course:  72 y.o. female with PMH significant for DM2, neuropathy, HTN, HLD, COPD, GERD, PAD, AS s/p TAVR, chronic diastolic CHF, anxiety/depression/schizophrenia, tobacco abuse. 12/11, she presented to ED with sudden onset of dizziness, blurred vision, headache, nausea and vomiting. Blood pressure was significantly elevated to over 154 systolic with EMS. She was admitted to ICU for hemorrhagic stroke and bilateral lower extremity acute on chronic ischemia.Vascular surgery was consulted and s/p bilateral lower extremity femoral endarterectomy and patch angioplasty with iliac stenting, 4 compartment fasciotomies for acute on chronic lower extremity ischemia. Postop remained on vent and Cleviprex.Transferred to floor and TRH on 12/17. Hypoglycemic 08/30/2022 with blood sugar in the 50s, insulin adjusted   Subjective: Seen and examined About to start wound care this am Overnight no new complaints.  Afebrile. Labs reviewed hemoglobin 8.9 stable BMP and WBC Wound vac and foley in place  Assessment and Plan: Principal Problem:   ICH (intracerebral hemorrhage) (HCC) Active Problems:   Abscess of anal and rectal regions   Decubitus ulcer of sacral region, stage 2 [facility])   Pulmonary edema  Rhabdomyolysis Left AKA and excisional debridement of bilateral groin wounds with VAC placement 12/21 Bilateral CFA endarterectomy with bovine patch angioplasty Bilateral iliofemoral embolectomy, stenting Bilateral CIA, stent Rt EIA and 4 compartment fasciotomies: Followed by vascular surgery on board input appreciated.  Patient with baseline Doppler flow right DP, staples intact fasciotomies> to be removed 1/8, wound VAC of bilateral groins and wound appears clean.  Cont wound care with MWF VAC changes- due today. Continue Plavix/statin. Continue Ancef and Flagyl for E. coli and  Proteus [name] in wound culture Will need total of 4 to 6 weeks of antibiotics per vascular surgery.  Acute [id number] CT scan showed acute intraparenchymal hematoma within the cerebellar vermis measuring 11 cc in volume and mild mass effect upon the fourth ventricle. Confirmed with an MRI.  Seen by neurology.  Eventually placed on antiplatelet agents tolerating Plavix 75.  Acute respiratory failure with hypoxia COPD  Smoker : Extubated 12/14.doing well on room air currently.  Continue  Brovana Pulmicort,Yupleri and albuterol as needed   Chronic diastolic CHF HTN: BP well-controlled.  Continue Coreg 12.5 twice daily, prn Hydralazine.  Monitor fluid status Echo EF 60 to 60% on 12/11 w/  indeterminate diastolic parameters. Net IO Since Admission: 3,647.22 mL [09/03/22 0858]   Intake/Output Summary (Last 24 hours) at 09/03/2022 0858 Last data filed at 09/02/2022 1600 Gross per 24 hour  Intake --  Output 1100 ml  Net -1100 ml    Type 2 diabetes mellitus with uncontrolled hyperglycemia A1c poorly controlled 12.9: Cont long-acting insulin and Premeal insulin and SSI>It seems patient has been refusing insulin has  brittle DM.  Sugar appears stable today Recent Labs  Lab 09/02/22 1957 09/03/22 0006 09/03/22 0312 09/03/22 0329 09/03/22 0757  GLUCAP 216* 115* 107* 143* 133*     Hx of CAD HLD AS s/p TAVR: Stable continue Plavix Lipitor.     Acute blood loss anemia in the setting of surgery: Total received 3 units PRBC.  Hemoglobin stable as below Recent Labs  Lab 08/29/22 0500 08/30/22 1133 08/31/22 0103 09/02/22 0709 09/03/22 0054  HGB 8.0* 8.4* 8.1* 9.0* 8.9*  HCT 26.6* 27.6* 25.5* 28.4* 28.0*    Depression, bipolar, schizophrenia, insomnia: Her mood has been stable.  Continue on Lexapro daily, Remeron nightly, trazodone nightly.,    Dysphagia:Likely related to  ICH.Has left nasal core track with ongoing tube feeds> T.  Has been on all oral intake picking up, continue  dysphagia diet dietitian and speech following dietitian reconsulted to see if patient still needs tube feed or not. Pressure injury stage II sacrum POA see below: Pressure Injury 08/04/22 Sacrum Stage 2 -  Partial thickness loss of dermis presenting as a shallow open injury with a red, pink wound bed without slough. (Active)  08/04/22 0100  Location: Sacrum  Location Orientation:   Staging: Stage 2 -  Partial thickness loss of dermis presenting as a shallow open injury with a red, pink wound bed without slough.  Wound Description (Comments):   Present on Admission: Yes  Dressing Type Foam - Lift dressing to assess site every shift 08/31/22 1950   DVT prophylaxis: heparin injection 5,000 Units Start: 08/10/22 1600 Code Status:   Code Status: Full Code Family Communication: plan of care discussed with patient at bedside. Patient status is: inpatient  because of poor po intake Level of care: Telemetry Medical   Dispo: The patient is from: Home, w/. Husband, he is here daily            Anticipated disposition: LTACH pending. Remains stable  Objective: Vitals last 24 hrs: Vitals:   09/03/22 0755 09/03/22 0833 09/03/22 0835 09/03/22 0836  BP: (!) 154/57     Pulse: 82     Resp: 13     Temp: 98.6 F (37 C)     TempSrc: Oral     SpO2: 95% 98% 99% 99%  Weight:      Height:       Weight change: -0.045 kg  Physical Examination: General exam: AAox3, weak,older appearing HEENT:Oral mucosa moist, Ear/Nose WNL grossly, dentition normal. Respiratory system: bilaterally clear BS,no use of accessory muscle Cardiovascular system: S1 & S2 +, regular rate. Gastrointestinal system: Abdomen soft, NT,ND,BS+.Lower abdomen w/ wound VAC in place Foley in place Nervous System:Alert, awake, moving extremities and grossly nonfocal. Extremities: Left AKA stump with staples in place, right incision site with a stable intact clean dry.   Skin: No rashes,no icterus. MSK: Normal muscle bulk,tone, power    Medications reviewed:  Scheduled Meds:  arformoterol  15 mcg Nebulization BID   atorvastatin  80 mg Per Tube Daily   budesonide (PULMICORT) nebulizer solution  0.5 mg Nebulization BID   carvedilol  12.5 mg Per Tube BID WC   Chlorhexidine Gluconate Cloth  6 each Topical Q0600   clopidogrel  75 mg Oral Daily   escitalopram  10 mg Per Tube Daily   feeding supplement  237 mL Oral TID WC   feeding supplement (OSMOLITE 1.5 CAL)  1,000 mL Per Tube Q24H   feeding supplement (PROSource TF20)  60 mL Per Tube Daily   Gerhardt's butt cream   Topical BID   heparin injection (subcutaneous)  5,000 Units Subcutaneous Q8H   insulin aspart  0-15 Units Subcutaneous Q4H   insulin aspart  2 Units Subcutaneous TID WC   insulin glargine-yfgn  5 Units Subcutaneous Daily   metroNIDAZOLE  500 mg Per Tube Q12H   mirtazapine  7.5 mg Per Tube QHS   multivitamin with minerals  1 tablet Per Tube Daily   nicotine  14 mg Transdermal Daily   mouth rinse  15 mL Mouth Rinse Q4H   oxyCODONE  10 mg Oral Q12H   pantoprazole  40 mg Oral Daily   revefenacin  175 mcg Nebulization Daily   senna-docusate  1 tablet Oral QHS  sodium chloride flush  10-40 mL Intracatheter Q12H  Continuous Infusions:   ceFAZolin (ANCEF) IV 2 g (09/03/22 0333)   Diet Order             DIET DYS 3 Room service appropriate? Yes with Assist; Fluid consistency: Thin  Diet effective now                  Intake/Output Summary (Last 24 hours) at 09/03/2022 0858 Last data filed at 09/02/2022 1600 Gross per 24 hour  Intake --  Output 1100 ml  Net -1100 ml   Net IO Since Admission: 3,647.22 mL [09/03/22 0858]  Wt Readings from Last 3 Encounters:  09/03/22 64.9 kg  08/03/22 58.7 kg  07/01/22 54.4 kg     Unresulted Labs (From admission, onward)     Start     Ordered   09/02/22 2130  Basic metabolic panel  Daily,   R     Question:  Specimen collection method  Answer:  Lab=Lab collect   09/01/22 0809   09/02/22 0500  CBC  Daily,   R      Question:  Specimen collection method  Answer:  Lab=Lab collect   09/01/22 0809          Data Reviewed: I have personally reviewed following labs and imaging studies CBC: Recent Labs  Lab 08/28/22 0340 08/29/22 0500 08/30/22 1133 08/31/22 0103 09/02/22 0709 09/03/22 0054  WBC 10.9* 9.2 8.1 7.4 6.5 7.2  NEUTROABS 8.0* 6.3 5.8 4.8  --   --   HGB 9.4* 8.0* 8.4* 8.1* 9.0* 8.9*  HCT 31.1* 26.6* 27.6* 25.5* 28.4* 28.0*  MCV 96.6 94.7 95.2 93.8 93.4 91.8  PLT 631* 504* 500* 470* 454* 865*   Basic Metabolic Panel: Recent Labs  Lab 08/28/22 0340 08/31/22 0059 09/02/22 0709 09/03/22 0054  NA 136 136 136 137  K 4.4 4.2 3.7 3.5  CL 98 99 98 102  CO2 '30 28 29 30  '$ GLUCOSE 232* 142* 146* 119*  BUN 36* '17 12 11  '$ CREATININE 0.71 0.74 0.67 0.78  CALCIUM 8.9 8.6* 8.9 8.7*  HQI:[id number] Creatinine Clearance: 55.8 mL/min (by C-G formula based on SCr of 0.78 mg/dL). Liver Function Tests: Recent Labs  Lab 08/28/22 0340  AST 19  ALT 6  ALKPHOS 101  BILITOT 0.3  PROT 6.2*  ALBUMIN 2.1*  CBG: Recent Labs  Lab 09/02/22 1957 09/03/22 0006 09/03/22 0312 09/03/22 0329 09/03/22 0757  GLUCAP 216* 115* 107* 143* 133*  No results found for this or any previous visit (from the past 240 hour(s)).  Antimicrobials: Anti-infectives (From admission, onward)    Start     Dose/Rate Route Frequency Ordered Stop   08/20/22 2200  metroNIDAZOLE (FLAGYL) IVPB 500 mg  Status:  Discontinued        500 mg 100 mL/hr over 60 Minutes Intravenous Every 12 hours 08/20/22 1110 08/20/22 1111   08/20/22 2200  metroNIDAZOLE (FLAGYL) tablet 500 mg        500 mg Per Tube Every 12 hours 08/20/22 1111     08/18/22 2300  ceFAZolin (ANCEF) IVPB 2g/100 mL premix        2 g 200 mL/hr over 30 Minutes Intravenous Every 8 hours 08/18/22 1455     08/18/22 2300  metroNIDAZOLE (FLAGYL) IVPB 500 mg  Status:  Discontinued        500 mg 100 mL/hr over 60 Minutes Intravenous Every 6 hours 08/18/22 1455 08/20/22  1110   08/15/22 1100  vancomycin (  VANCOCIN) IVPB 1000 mg/200 mL premix  Status:  Discontinued        1,000 mg 200 mL/hr over 60 Minutes Intravenous Every 24 hours 08/14/22 1006 08/15/22 1307   08/14/22 1400  piperacillin-tazobactam (ZOSYN) IVPB 3.375 g  Status:  Discontinued        3.375 g 12.5 mL/hr over 240 Minutes Intravenous Every 8 hours 08/14/22 1006 08/18/22 1455   08/14/22 1100  vancomycin (VANCOREADY) IVPB 1500 mg/300 mL        1,500 mg 150 mL/hr over 120 Minutes Intravenous  Once 08/14/22 1006 08/14/22 1502   08/04/22 1045  ceFAZolin (ANCEF) IVPB 2g/100 mL premix        2 g 200 mL/hr over 30 Minutes Intravenous On call to O.R. 08/04/22 0958 08/04/22 1100     Culture/Microbiology    Component Value Date/Time   SDES WOUND 08/14/2022 0836   SPECREQUEST GROIN 08/14/2022 0836   CULT  08/14/2022 0836    MODERATE ESCHERICHIA COLI RARE PROTEUS MIRABILIS MODERATE BACTEROIDES OVATUS MODERATE BACTEROIDES STERCORIS BETA LACTAMASE POSITIVE Performed at [facility], [place] [address], [place],  [address]    REPTSTATUS 08/17/2022 FINAL 08/14/2022 5573    Radiology Studies: No results found.   LOS: 30 days   Antonieta Pert, MD [facility]  09/03/2022, 8:58 AM

## 2022-09-04 DIAGNOSIS — I614 Nontraumatic intracerebral hemorrhage in cerebellum: Secondary | ICD-10-CM | POA: Diagnosis not present

## 2022-09-04 LAB — BASIC METABOLIC PANEL
Anion gap: 9 (ref 5–15)
BUN: 11 mg/dL (ref 8–23)
CO2: 29 mmol/L (ref 22–32)
Calcium: 8.6 mg/dL — ABNORMAL LOW (ref 8.9–10.3)
Chloride: 100 mmol/L (ref 98–111)
Creatinine, Ser: 0.73 mg/dL (ref 0.44–1.00)
GFR, Estimated: >60 mL/min (ref >60)
Glucose, Bld: 74 mg/dL (ref 70–99)
Potassium: 3.5 mmol/L (ref 3.5–5.1)
Sodium: 138 mmol/L (ref 135–145)

## 2022-09-04 LAB — CBC
HCT: 28.8 % — ABNORMAL LOW (ref 36.0–46.0)
Hemoglobin: 8.8 g/dL — ABNORMAL LOW (ref 12.0–15.0)
MCH: 29.2 pg (ref 26.0–34.0)
MCHC: 30.6 g/dL (ref 30.0–36.0)
MCV: 95.7 fL (ref 80.0–100.0)
Platelets: 443 10*3/uL — ABNORMAL HIGH (ref 150–400)
RBC: 3.01 MIL/uL — ABNORMAL LOW (ref 3.87–5.11)
RDW: 14.8 % (ref 11.5–15.5)
WBC: 8 10*3/uL (ref 4.0–10.5)
nRBC: 0 % (ref 0.0–0.2)

## 2022-09-04 LAB — GLUCOSE, CAPILLARY
Glucose-Capillary: 87 mg/dL (ref 70–99)
Glucose-Capillary: 120 mg/dL — ABNORMAL HIGH (ref 70–99)
Glucose-Capillary: 172 mg/dL — ABNORMAL HIGH (ref 70–99)
Glucose-Capillary: 90 mg/dL (ref 70–99)
Glucose-Capillary: 243 mg/dL — ABNORMAL HIGH (ref 70–99)

## 2022-09-04 NOTE — Progress Notes (Signed)
   Inpatient Rehab Admissions Coordinator :  Per MD request, patient was screened for CIR candidacy by Danne Baxter RN MSN. Patient is not yet at a level to tolerate the intensity required to pursue a CIR admit. Recommend other rehab venues to be pursued. I concur with therapy recommendations for SNF Please contact me with any questions.  Danne Baxter RN MSN Admissions Coordinator [phone]

## 2022-09-04 NOTE — Progress Notes (Addendum)
Progress Note    09/04/2022 6:47 AM 21 Days Post-Op  Subjective:  sleeping, wakes easily; glad the tube is out of her nose  Tm 99 now afebrile  Vitals:   09/04/22 0421 09/04/22 0533  BP: (!) 128/39   Pulse: 70 74  Resp: 15 12  Temp: 98.5 F (36.9 C)   SpO2: 98% 93%    Physical Exam: General:  no distress Lungs:  non labored Incisions:  bilateral groins with wound vacs with good seal; left stump unchanged.  Staples removed yesterday from fasciotomy sites-looks good Extremities:  right foot is warm and well perfused.    CBC    Component Value Date/Time   WBC 8.0 09/04/2022 0315   RBC 3.01 (L) 09/04/2022 0315   HGB 8.8 (L) 09/04/2022 0315   HGB 14.3 06/20/2012 2127   HCT 28.8 (L) 09/04/2022 0315   HCT 42.9 06/20/2012 2127   PLT 443 (H) 09/04/2022 0315   PLT 357 06/20/2012 2127   MCV 95.7 09/04/2022 0315   MCV 91 06/20/2012 2127   MCH 29.2 09/04/2022 0315   MCHC 30.6 09/04/2022 0315   RDW 14.8 09/04/2022 0315   RDW 13.6 06/20/2012 2127   LYMPHSABS 1.3 08/31/2022 0103   MONOABS 0.9 08/31/2022 0103   EOSABS 0.3 08/31/2022 0103   BASOSABS 0.1 08/31/2022 0103    BMET    Component Value Date/Time   NA 138 09/04/2022 0315   NA 135 (L) 06/20/2012 2127   K 3.5 09/04/2022 0315   K 4.0 06/20/2012 2127   CL 100 09/04/2022 0315   CL 100 06/20/2012 2127   CO2 29 09/04/2022 0315   CO2 27 06/20/2012 2127   GLUCOSE 74 09/04/2022 0315   GLUCOSE 250 (H) 06/20/2012 2127   BUN 11 09/04/2022 0315   BUN 20 (H) 06/20/2012 2127   CREATININE 0.73 09/04/2022 0315   CREATININE 0.60 06/20/2012 2127   CALCIUM 8.6 (L) 09/04/2022 0315   CALCIUM 8.8 06/20/2012 2127   GFRNONAA >60 09/04/2022 0315   GFRNONAA >60 06/20/2012 2127   GFRAA >60 06/20/2012 2127    INR    Component Value Date/Time   INR 1.0 08/03/2022 2156     Intake/Output Summary (Last 24 hours) at 09/04/2022 0647 Last data filed at 09/04/2022 0422 Gross per 24 hour  Intake 1376.22 ml  Output 1450 ml  Net  -73.78 ml      Assessment/Plan:  72 y.o. female is s/p:  Left AKA and excisional debridement of bilateral groin wounds with vac placement  21 Days Post-Op And Bilateral CFA endarterectomy with bovine patch angioplasty, bilateral iliofemoral embolectomy, stenting bilateral CIA, stent right EIA and 4 compartment fasciotomies   30 Days Post-Op   -bilateral groins with vac with good seal-for vac change tomorrow with WOC -right foot is warm and well perfused and fasciotomy sites look good with staples out.  Left stump is unchanged -DVT prophylaxis:  heparin sq -continue plavix/statin -Ancef q8h and Flagyl bid for E coli and Proteus mirabilis on wound culture -f/u in our office in 3 weeks (unless she is at [facility] - if she is there, we can go there and remove staples) for staple removal of left AKA and wound check.  Our office will arrange appt.     Leontine Locket, PA-C [facility] [phone] 09/04/2022 6:47 AM   I have independently interviewed and examined patient and agree with PA assessment and plan above.  She is okay for discharge from vascular surgery standpoint and we will  follow-up as above for staple removal of the left AKA.  Continue groin wound VAC changes per wound ostomy nurse.  Tiwatope Emmitt C. Donzetta Matters, MD [facility] [place] Office: [phone] Pager: [phone]

## 2022-09-04 NOTE — Progress Notes (Signed)
PROGRESS NOTE Heather [name]  EQA:[id number] DOB: 1950/12/22 DOA: 08/04/2022 PCP: [name], FNP   Brief Narrative/Hospital Course:  72 y.o. female with PMH significant for DM2, neuropathy, HTN, HLD, COPD, GERD, PAD, AS s/p TAVR, chronic diastolic CHF, anxiety/depression/schizophrenia, tobacco abuse. 12/11, she presented to ED with sudden onset of dizziness, blurred vision, headache, nausea and vomiting. Blood pressure was significantly elevated to over 979 systolic with EMS. She was admitted to ICU for hemorrhagic stroke and bilateral lower extremity acute on chronic ischemia.Vascular surgery was consulted and s/p bilateral lower extremity femoral endarterectomy and patch angioplasty with iliac stenting, 4 compartment fasciotomies for acute on chronic lower extremity ischemia. Postop remained on vent and Cleviprex.Transferred to floor and TRH on 12/17. Hypoglycemic 08/30/2022 with blood sugar in the 50s, insulin adjusted Oral intake picking up patient did not want to have lumbar NG tube/feeding core track removed 1/10 Patient remains weak deconditioned unable to mobilize, needing wound care with [facility] change Monday Wednesday Friday  Subjective: Seen and examined this morning resting comfortably on 2 L nasal cannula.   Labs reviewed and remained stable  She reports she ate most of her meal this morning Dressing changed yesterday  Assessment and Plan: Principal Problem:   ICH (intracerebral hemorrhage) (HCC) Active Problems:   Abscess of anal and rectal regions   Decubitus ulcer of sacral region, stage 2 (HCC)   Pulmonary edema  Rhabdomyolysis Left AKA and excisional debridement of bilateral groin wounds with VAC placement 12/21 Bilateral CFA endarterectomy with bovine patch angioplasty Bilateral iliofemoral embolectomy, stenting Bilateral CIA, stent Rt EIA and 4 compartment fasciotomies: Followed by vascular surgery on board.Patient with baseline Doppler flow right DP. S/P procedures as  above.  Cont on wound VAC MWF on  bilateral groins and wound appears to be healing. She remains weak ,deconditionined> continues to mobilize.  Continue Plavix/statin. Continue Ancef and Flagyl for E. coli and Proteus [name] in wound culture Will need total of 4 to 6 weeks of antibiotics per vascular surgery. Did P2P w/ Dr Jerene Pitch: LTACH denied,she has approved for SNF and can approve for inpatient rehab if requested.  Acute GXQ:JJHERDE CT scan showed acute intraparenchymal hematoma within the cerebellar vermis measuring 11 cc in volume and mild mass effect upon the fourth ventricle. Confirmed with an MRI.  Seen by neurology.  Eventually placed on antiplatelet agents tolerating Plavix 75.  Acute respiratory failure with hypoxia COPD  Smoker : Extubated 12/14.respiratory status appears to be stable on 2 L nasal cannula to room air. Cont Brovana Pulmicort,Yupleri and albuterol as needed.   Chronic diastolic CHF HTN: BP well-controlled.  Volume status stable.Continue Coreg 12.5 twice daily, prn Hydralazine.  Monitor fluid status as below. Echo EF 60 to 60% on 12/11 w/  indeterminate diastolic parameters. Net IO Since Admission: 3,273.44 mL [09/04/22 1239]   Intake/Output Summary (Last 24 hours) at 09/04/2022 1239 Last data filed at 09/04/2022 1133 Gross per 24 hour  Intake 710 ml  Output 1100 ml  Net -390 ml    Type 2 diabetes mellitus with uncontrolled hyperglycemia A1c poorly controlled 12.9: Cont long-acting insulin 5 units Semglee, Premeal insulin and SSI blood sugar overall stable. She has a brittle DM.  Recent Labs  Lab 09/03/22 1948 09/03/22 2328 09/04/22 0419 09/04/22 0809 09/04/22 1132  GLUCAP 151* 130* 87 120* 172*     Hx of CAD HLD AS s/p TAVR: Stable continue Plavix Lipitor.     Acute blood loss anemia in the setting of surgery: s/p 3 units PRBC so  far.Hemoglobin stable as below Recent Labs  Lab 08/30/22 1133 08/31/22 0103 09/02/22 0709 09/03/22 0054  09/04/22 0315  HGB 8.4* 8.1* 9.0* 8.9* 8.8*  HCT 27.6* 25.5* 28.4* 28.0* 28.8*    Depression, bipolar, schizophrenia, insomnia: mood has been stable.  Continue on Lexapro daily, Remeron nightly, trazodone nightly.,    Dysphagia:Likely related to [place].Has left nasal core track with ongoing tube feeds> T.  Has been on all oral intake picking up, continue dysphagia diet  as below. Off NG> SHE DOES NOT WANT to have tube feeding.  Pressure injury stage II sacrum POA see below: Pressure Injury 08/04/22 Sacrum Stage 2 -  Partial thickness loss of dermis presenting as a shallow open injury with a red, pink wound bed without slough. (Active)  08/04/22 0100  Location: Sacrum  Location Orientation:   Staging: Stage 2 -  Partial thickness loss of dermis presenting as a shallow open injury with a red, pink wound bed without slough.  Wound Description (Comments):   Present on Admission: Yes  Dressing Type Foam - Lift dressing to assess site every shift 08/31/22 1950   DVT prophylaxis: heparin injection 5,000 Units Start: 08/10/22 1600 Code Status:   Code Status: Full Code Family Communication: plan of care discussed with patient at bedside. Patient status is: inpatient  because of poor po intake Level of care: Telemetry Medical   Dispo: The patient is from: Home, w/. Husband, he is here daily            Anticipated disposition: LTACH was denied by insurance offered P2P I did reach out to the insurance for peer to peer and left my number for call back   Objective: Vitals last 24 hrs: Vitals:   09/04/22 0913 09/04/22 0914 09/04/22 0920 09/04/22 1124  BP:    (!) 157/64  Pulse:    75  Resp:    13  Temp:    99.2 F (37.3 C)  TempSrc:    Oral  SpO2: 94% 94% 94% 98%  Weight:      Height:       Weight change: 0.907 kg  Physical Examination: General exam: AAOX3, weak,older appearing HEENT:Oral mucosa moist, Ear/Nose WNL grossly, dentition normal. Respiratory system: bilaterally CLEAR BS,no use  of accessory muscle Cardiovascular system: S1 & S2 +, regular rate,. Gastrointestinal system: Abdomen soft, wound vac on lower abd groin area,ND,BS+ Nervous System:Alert, awake, moving extremities and grossly nonfocal Extremities: Left AKA stump clean dry right incision with intact stable  Skin: No rashes,no icterus. MSK: Normal muscle bulk,tone, power   Medications reviewed:  Scheduled Meds:  arformoterol  15 mcg Nebulization BID   atorvastatin  80 mg Per Tube Daily   budesonide (PULMICORT) nebulizer solution  0.5 mg Nebulization BID   carvedilol  12.5 mg Per Tube BID WC   Chlorhexidine Gluconate Cloth  6 each Topical Q0600   clopidogrel  75 mg Oral Daily   escitalopram  10 mg Per Tube Daily   feeding supplement  237 mL Oral TID WC   Gerhardt's butt cream   Topical BID   heparin injection (subcutaneous)  5,000 Units Subcutaneous Q8H   insulin aspart  0-15 Units Subcutaneous Q4H   insulin aspart  2 Units Subcutaneous TID WC   insulin glargine-yfgn  5 Units Subcutaneous Daily   metroNIDAZOLE  500 mg Per Tube Q12H   mirtazapine  7.5 mg Per Tube QHS   multivitamin with minerals  1 tablet Per Tube Daily   nicotine  14 mg Transdermal  Daily   mouth rinse  15 mL Mouth Rinse Q4H   oxyCODONE  10 mg Oral Q12H   pantoprazole  40 mg Oral Daily   revefenacin  175 mcg Nebulization Daily   senna-docusate  1 tablet Oral QHS   sodium chloride flush  10-40 mL Intracatheter Q12H  Continuous Infusions:   ceFAZolin (ANCEF) IV 2 g (09/04/22 1044)   Diet Order             DIET DYS 3 Room service appropriate? Yes with Assist; Fluid consistency: Thin  Diet effective now                  Intake/Output Summary (Last 24 hours) at 09/04/2022 1239 Last data filed at 09/04/2022 1133 Gross per 24 hour  Intake 710 ml  Output 1100 ml  Net -390 ml   Net IO Since Admission: 3,273.44 mL [09/04/22 1239]  Wt Readings from Last 3 Encounters:  09/04/22 65.8 kg  08/03/22 58.7 kg  07/01/22 54.4 kg      Unresulted Labs (From admission, onward)     Start     Ordered   09/02/22 6962  Basic metabolic panel  Daily,   R     Question:  Specimen collection method  Answer:  Lab=Lab collect   09/01/22 0809   09/02/22 0500  CBC  Daily,   R     Question:  Specimen collection method  Answer:  Lab=Lab collect   09/01/22 0809          Data Reviewed: I have personally reviewed following labs and imaging studies CBC: Recent Labs  Lab 08/29/22 0500 08/30/22 1133 08/31/22 0103 09/02/22 0709 09/03/22 0054 09/04/22 0315  WBC 9.2 8.1 7.4 6.5 7.2 8.0  NEUTROABS 6.3 5.8 4.8  --   --   --   HGB 8.0* 8.4* 8.1* 9.0* 8.9* 8.8*  HCT 26.6* 27.6* 25.5* 28.4* 28.0* 28.8*  MCV 94.7 95.2 93.8 93.4 91.8 95.7  PLT 504* 500* 470* 454* 447* 952*   Basic Metabolic Panel: Recent Labs  Lab 08/31/22 0059 09/02/22 0709 09/03/22 0054 09/04/22 0315  NA 136 136 137 138  K 4.2 3.7 3.5 3.5  CL 99 98 102 100  CO2 '28 29 30 29  '$ GLUCOSE 142* 146* 119* 74  BUN '17 12 11 11  '$ CREATININE 0.74 0.67 0.78 0.73  CALCIUM 8.6* 8.9 8.7* 8.6*  WUX:[id number] Creatinine Clearance: 56.2 mL/min (by C-G formula based on SCr of 0.73 mg/dL). Liver Function Tests: No results for input(s): "AST", "ALT", "ALKPHOS", "BILITOT", "PROT", "ALBUMIN" in the last 168 hours. CBG: Recent Labs  Lab 09/03/22 1948 09/03/22 2328 09/04/22 0419 09/04/22 0809 09/04/22 1132  GLUCAP 151* 130* 87 120* 172*  No results found for this or any previous visit (from the past 240 hour(s)).  Antimicrobials: Anti-infectives (From admission, onward)    Start     Dose/Rate Route Frequency Ordered Stop   08/20/22 2200  metroNIDAZOLE (FLAGYL) IVPB 500 mg  Status:  Discontinued        500 mg 100 mL/hr over 60 Minutes Intravenous Every 12 hours 08/20/22 1110 08/20/22 1111   08/20/22 2200  metroNIDAZOLE (FLAGYL) tablet 500 mg        500 mg Per Tube Every 12 hours 08/20/22 1111     08/18/22 2300  ceFAZolin (ANCEF) IVPB 2g/100 mL premix        2 g 200  mL/hr over 30 Minutes Intravenous Every 8 hours 08/18/22 1455     08/18/22 2300  metroNIDAZOLE (FLAGYL) IVPB 500 mg  Status:  Discontinued        500 mg 100 mL/hr over 60 Minutes Intravenous Every 6 hours 08/18/22 1455 08/20/22 1110   08/15/22 1100  vancomycin (VANCOCIN) IVPB 1000 mg/200 mL premix  Status:  Discontinued        1,000 mg 200 mL/hr over 60 Minutes Intravenous Every 24 hours 08/14/22 1006 08/15/22 1307   08/14/22 1400  piperacillin-tazobactam (ZOSYN) IVPB 3.375 g  Status:  Discontinued        3.375 g 12.5 mL/hr over 240 Minutes Intravenous Every 8 hours 08/14/22 1006 08/18/22 1455   08/14/22 1100  vancomycin (VANCOREADY) IVPB 1500 mg/300 mL        1,500 mg 150 mL/hr over 120 Minutes Intravenous  Once 08/14/22 1006 08/14/22 1502   08/04/22 1045  ceFAZolin (ANCEF) IVPB 2g/100 mL premix        2 g 200 mL/hr over 30 Minutes Intravenous On call to O.R. 08/04/22 0958 08/04/22 1100     Culture/Microbiology    Component Value Date/Time   SDES WOUND 08/14/2022 0836   SPECREQUEST GROIN 08/14/2022 0836   CULT  08/14/2022 0836    MODERATE ESCHERICHIA COLI RARE PROTEUS MIRABILIS MODERATE BACTEROIDES OVATUS MODERATE BACTEROIDES STERCORIS BETA LACTAMASE POSITIVE Performed at [facility], [place] [address], [place], [place] [address]    REPTSTATUS 08/17/2022 FINAL 08/14/2022 0355    Radiology Studies: No results found.   LOS: 31 days   Antonieta Pert, MD [facility]  09/04/2022, 12:39 PM

## 2022-09-04 NOTE — NC FL2 (Signed)
[place] LEVEL OF CARE FORM     IDENTIFICATION  Patient Name: Heather [name] Birthdate: Aug 03, 1951 Sex: female Admission Date (Current Location): 08/04/2022  [facility] [place] Number:      Facility and Address:  [facility], [place] [address], [place], [place] [address]      Provider Number: [id number]  Attending Physician Name and Address:  Antonieta Pert, MD  Relative Name and Phone Number:  Kreft,Deual "[name]" (Spouse)  [phone] (Mobile)    Current Level of Care: Hospital Recommended Level of Care: [place] Prior Approval Number:    Date Approved/Denied:   PASRR Number: [id number]  Discharge Plan: Home    Current Diagnoses: Patient Active Problem List   Diagnosis Date Noted   Pulmonary edema 08/16/2022   Abscess of anal and rectal regions 08/15/2022   Decubitus ulcer of sacral region, stage 2 ([place]) 08/15/2022   ICH (intracerebral hemorrhage) ([place]) 08/04/2022   Atherosclerosis of native arteries of extremity with rest pain ([place]) 04/02/2022   PAD (peripheral artery disease) ([place]) 04/11/2021   Ischemic leg pain 04/11/2021   Chronic mesenteric ischemia ([place]) 12/17/2020   Malnutrition of moderate degree 11/12/2020   Asymptomatic bacteriuria 11/10/2020   Abdominal pain 11/09/2020   Hypokalemia 11/09/2020   AKI (acute kidney injury) ([place]) 11/09/2020   MDD (major depressive disorder), recurrent, in full remission ([place]) 01/16/2020   Anxiety disorder 01/16/2020   Hyperlipidemia 12/15/2019   Carotid stenosis 12/15/2019   MDD (major depressive disorder), recurrent, in partial remission ([place]) 08/24/2019   MDD (major depressive disorder), recurrent episode, mild ([place]) 04/08/2019   Insomnia due to mental condition 04/08/2019   Major neurocognitive disorder due to Alzheimer's disease, probable, without behavioral disturbance 04/08/2019   DDD (degenerative disc disease), cervical 12/19/2016   Frequent falls 12/10/2016    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   Nonrheumatic aortic valve stenosis 09/30/2016   (HFpEF) heart failure with preserved ejection fraction ([place]) 09/20/2016   COPD (chronic obstructive pulmonary disease) ([place]) 09/20/2016   Respiratory failure with hypoxia ([place]) 09/20/2016   NSTEMI (non-ST elevated myocardial infarction) ([place]) 08/21/2016   SOB (shortness of breath) 08/18/2016   Atherosclerosis of native arteries of extremity with intermittent claudication ([place]) 01/08/2016   Palpitations 07/10/2015   History of nonmelanoma skin cancer 03/16/2015   Pseudoaneurysm following procedure ([place]) 07/19/2014   Femoral artery occlusion, right ([place]) 06/28/2014   Claudication ([place]) 06/05/2014   Aortic insufficiency 03/20/2014   CAD (coronary artery disease) 03/20/2014   Chronic pain 03/20/2014   Memory loss 11/09/2012   Depressive disorder 08/02/2012   Neck pain 08/02/2012   Malaise and fatigue 08/02/2012   Hypertension, benign 08/02/2012   Dizziness 08/02/2012   Polyneuropathy in diabetes ([place]) 08/02/2012   Rheumatic fever with heart involvement 08/02/2012   Tobacco use disorder 08/02/2012   Diabetes mellitus ([place]) 08/02/1990    Orientation RESPIRATION BLADDER Height & Weight     Self, Time, Situation, Place  O2 (2l [place]) Incontinent, Indwelling catheter Weight: 145 lb (65.8 kg) Height:  5' (152.4 cm)  BEHAVIORAL SYMPTOMS/MOOD NEUROLOGICAL BOWEL NUTRITION STATUS      Continent Diet (see d/c summary)  AMBULATORY STATUS COMMUNICATION OF NEEDS Skin   Extensive Assist Verbally Wound Vac (WOundvac buttocks, groin; pressure injury stage 2 buttocks)                       Personal Care Assistance Level of Assistance  Bathing, Feeding, Dressing Bathing Assistance: Limited assistance Feeding  assistance: Independent Dressing Assistance: Limited assistance     Functional Limitations Info  Sight, Hearing, Speech Sight Info: Impaired Hearing Info: Impaired Speech  Info: Adequate    SPECIAL CARE FACTORS FREQUENCY  OT (By licensed OT), PT (By licensed PT)     PT Frequency: 5x/week OT Frequency: 5x/week            Contractures Contractures Info: Not present    Additional Factors Info  Code Status, Allergies Code Status Info: Full code Allergies Info: Iodinated Contrast Media, sulfa antibiotics, Shellfish Allergy           Current Medications (09/04/2022):  This is the current hospital active medication list Current Facility-Administered Medications  Medication Dose Route Frequency Provider Last Rate Last Admin   acetaminophen (TYLENOL) tablet 650 mg  650 mg Oral Q4H PRN Ulyses Amor, PA-C   650 mg at 09/04/22 0303   Or   acetaminophen (TYLENOL) 160 MG/5ML solution 650 mg  650 mg Per Tube Q4H PRN Ulyses Amor, PA-C   650 mg at 08/11/22 5643   Or   acetaminophen (TYLENOL) suppository 650 mg  650 mg Rectal Q4H PRN Laurence Slate M, PA-C       albuterol (PROVENTIL) (2.5 MG/3ML) 0.083% nebulizer solution 2.5 mg  2.5 mg Nebulization Q4H PRN Laurence Slate M, PA-C   2.5 mg at 08/28/22 0400   arformoterol (BROVANA) nebulizer solution 15 mcg  15 mcg Nebulization BID Laurence Slate M, PA-C   15 mcg at 09/04/22 0913   atorvastatin (LIPITOR) tablet 80 mg  80 mg Per Tube Daily Dahal, Marlowe Aschoff, MD   80 mg at 09/04/22 1037   budesonide (PULMICORT) nebulizer solution 0.5 mg  0.5 mg Nebulization BID Laurence Slate M, PA-C   0.5 mg at 09/04/22 0914   carvedilol (COREG) tablet 12.5 mg  12.5 mg Per Tube BID WC Laurence Slate M, PA-C   12.5 mg at 09/04/22 0817   ceFAZolin (ANCEF) IVPB 2g/100 mL premix  2 g Intravenous Q8H Allie Bossier, MD 200 mL/hr at 09/04/22 1044 2 g at 09/04/22 1044   Chlorhexidine Gluconate Cloth 2 % PADS 6 each  6 each Topical Q0600 Ulyses Amor, PA-C   6 each at 09/03/22 0745   clopidogrel (PLAVIX) tablet 75 mg  75 mg Oral Daily Laurence Slate M, PA-C   75 mg at 09/04/22 1037   escitalopram (LEXAPRO) tablet 10 mg  10 mg Per Tube  Daily Laurence Slate M, PA-C   10 mg at 09/04/22 1037   feeding supplement (ENSURE ENLIVE / ENSURE PLUS) liquid 237 mL  237 mL Oral TID WC Dahal, Marlowe Aschoff, MD   237 mL at 09/01/22 1743   Gerhardt's butt cream   Topical BID Terrilee Croak, MD   Given at 09/04/22 1041   heparin injection 5,000 Units  5,000 Units Subcutaneous Q8H Laurence Slate M, PA-C   5,000 Units at 09/04/22 3295   hydrALAZINE (APRESOLINE) injection 10 mg  10 mg Intravenous Q6H PRN Ulyses Amor, PA-C   10 mg at 08/08/22 1446   HYDROmorphone (DILAUDID) injection 0.5-1 mg  0.5-1 mg Intravenous Q4H PRN Ulyses Amor, PA-C   1 mg at 09/03/22 1884   insulin aspart (novoLOG) injection 0-15 Units  0-15 Units Subcutaneous Q4H Allie Bossier, MD   2 Units at 09/04/22 1222   insulin aspart (novoLOG) injection 2 Units  2 Units Subcutaneous TID WC Terrilee Croak, MD   2 Units at 09/04/22 1223   insulin glargine-yfgn (SEMGLEE)  injection 5 Units  5 Units Subcutaneous Daily Irene Pap N, DO   5 Units at 09/03/22 1028   metroNIDAZOLE (FLAGYL) tablet 500 mg  500 mg Per Tube Q12H Donnamae Jude, RPH   500 mg at 09/04/22 1037   mirtazapine (REMERON) tablet 7.5 mg  7.5 mg Per Tube QHS Dahal, Marlowe Aschoff, MD   7.5 mg at 09/03/22 2135   multivitamin with minerals tablet 1 tablet  1 tablet Per Tube Daily Laurence Slate M, PA-C   1 tablet at 09/04/22 1037   nicotine (NICODERM CQ - dosed in mg/24 hours) patch 14 mg  14 mg Transdermal Daily Laurence Slate M, PA-C   14 mg at 09/04/22 1039   ondansetron (ZOFRAN) injection 4 mg  4 mg Intravenous Q6H PRN Laurence Slate M, PA-C   4 mg at 08/20/22 0501   Oral care mouth rinse  15 mL Mouth Rinse PRN Ulyses Amor, PA-C       Oral care mouth rinse  15 mL Mouth Rinse Q4H Laurence Slate M, PA-C   15 mL at 09/04/22 1045   oxyCODONE (Oxy IR/ROXICODONE) immediate release tablet 5 mg  5 mg Oral Q4H PRN Laurence Slate M, PA-C   5 mg at 09/04/22 0303   oxyCODONE (OXYCONTIN) 12 hr tablet 10 mg  10 mg Oral Q12H Laurence Slate M, PA-C    10 mg at 09/04/22 1037   pantoprazole (PROTONIX) EC tablet 40 mg  40 mg Oral Daily Donnamae Jude, RPH   40 mg at 09/04/22 1037   polyethylene glycol (MIRALAX / GLYCOLAX) packet 17 g  17 g Oral Daily PRN Terrilee Croak, MD   17 g at 09/03/22 2136   revefenacin (YUPELRI) nebulizer solution 175 mcg  175 mcg Nebulization Daily Laurence Slate M, PA-C   175 mcg at 09/04/22 0920   senna-docusate (Senokot-S) tablet 1 tablet  1 tablet Oral QHS Dahal, Marlowe Aschoff, MD   1 tablet at 09/03/22 2135   sodium chloride flush (NS) 0.9 % injection 10-40 mL  10-40 mL Intracatheter Q12H Laurence Slate M, PA-C   10 mL at 09/03/22 2137   sodium chloride flush (NS) 0.9 % injection 10-40 mL  10-40 mL Intracatheter PRN Ulyses Amor, PA-C   20 mL at 08/26/22 0555   traZODone (DESYREL) tablet 25 mg  25 mg Per Tube QHS PRN Ulyses Amor, PA-C   25 mg at 09/02/22 2119     Discharge Medications: Please see discharge summary for a list of discharge medications.  Relevant Imaging Results:  Relevant Lab Results:   Additional Information SSN [place] [id number] [place], [place]

## 2022-09-04 NOTE — Progress Notes (Signed)
Occupational Therapy Treatment Patient Details Name: Heather [name] MRN: [id number] DOB: January 28, 1951 Today's Date: 09/04/2022   History of present illness Pt is a 72 y.o. female admitted 08/04/22 with headache, dizziness. 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  Found to have mottled LE's after given Narcdipine due to HTN; s/p emergent common femoral and iliofemoral endarterectomy and bilateral 4 compartment fasciotomies on 12/11. 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 PMH includes smoker(2ppd), severe AI/AS s/p TAVR, CAD s/p PCI and on DAPT, COPD, PAD, HTN.   OT comments  Patient demonstrating progress with bed mobility and sitting balance with patient requiring mod assist to get to EOB and min to mod assist for sitting balance. Patient required increased time to get to EOB and back to supine to avoid increasing dizziness with patient stating only occasional dizziness. Patient would benefit from further OT to address bed mobility and self care.    Recommendations for follow up therapy are one component of a multi-disciplinary discharge planning process, led by the attending physician.  Recommendations may be updated based on patient status, additional functional criteria and insurance authorization.    Follow Up Recommendations  Skilled nursing-short term rehab (<3 hours/day)     Assistance Recommended at Discharge Frequent or constant Supervision/Assistance  Patient can return home with the following  Two people to help with walking and/or transfers;A lot of help with bathing/dressing/bathroom;Assistance with cooking/housework;Assistance with feeding;Direct supervision/assist for medications management;Direct supervision/assist for financial management;Assist for transportation;Help with stairs or ramp for entrance   Equipment Recommendations  Other (comment) (defer)    Recommendations for Other Services      Precautions / Restrictions  Precautions Precautions: Fall;Other (comment) Precaution Comments: bilateral groin wound vac; watch SpO2; bowel incontinence; multiple buttocks wounds Restrictions Weight Bearing Restrictions: Yes RLE Weight Bearing: Weight bearing as tolerated LLE Weight Bearing: Non weight bearing       Mobility Bed Mobility Overal bed mobility: Needs Assistance Bed Mobility: Supine to Sit, Sit to Supine     Supine to sit: Mod assist, HOB elevated Sit to supine: Max assist, HOB elevated   General bed mobility comments: increased time to get to EOB and back to supine to prevent increased dizziness    Transfers Overall transfer level: Needs assistance                 General transfer comment: deferred     Balance Overall balance assessment: Needs assistance Sitting-balance support: Bilateral upper extremity supported, Feet unsupported, Single extremity supported Sitting balance-Leahy Scale: Poor Sitting balance - Comments: min/mod assist for sitting balance initially and increased to min assist to min guard with one extremity support.  Performed reaching activities to address balance Postural control: Posterior lean                                 ADL either performed or assessed with clinical judgement   ADL Overall ADL's : Needs assistance/impaired                                       General ADL Comments: focused on EOB sitting balance and bed mobility    Extremity/Trunk Assessment              Vision       Perception  Praxis      Cognition Arousal/Alertness: Awake/alert Behavior During Therapy: Flat affect Overall Cognitive Status: No family/caregiver present to determine baseline cognitive functioning Area of Impairment: Attention, Following commands, Safety/judgement, Awareness, Problem solving                   Current Attention Level: Selective   Following Commands: Follows one step commands  consistently Safety/Judgement: Decreased awareness of deficits Awareness: Emergent Problem Solving: Requires verbal cues, Slow processing          Exercises      Shoulder Instructions       General Comments      Pertinent Vitals/ Pain       Pain Assessment Pain Assessment: Faces Faces Pain Scale: Hurts little more Pain Location: generalized Pain Descriptors / Indicators: Grimacing, Guarding, Discomfort Pain Intervention(s): Limited activity within patient's tolerance, Monitored during session, Repositioned  Home Living                                          Prior Functioning/Environment              Frequency  Min 2X/week        Progress Toward Goals  OT Goals(current goals can now be found in the care plan section)  Progress towards OT goals: Progressing toward goals  Acute Rehab OT Goals Patient Stated Goal: go home OT Goal Formulation: With patient Time For Goal Achievement: 09/09/22 Potential to Achieve Goals: Good ADL Goals Pt Will Perform Lower Body Bathing: with min assist;sitting/lateral leans;sit to/from stand;with adaptive equipment Pt Will Perform Lower Body Dressing: with min assist;sit to/from stand;sitting/lateral leans;with adaptive equipment Pt Will Transfer to Toilet: with min assist;bedside commode;squat pivot transfer;with transfer board;with +2 assist Pt Will Perform Toileting - Clothing Manipulation and hygiene: with min assist;sitting/lateral leans Additional ADL Goal #1: Pt will complete bed mobility with min assist and maintain sitting balance dynamically for 5 minutes as precursor to ADLs.  Plan Discharge plan remains appropriate;Frequency remains appropriate    Co-evaluation                 AM-PAC OT "6 Clicks" Daily Activity     Outcome Measure   Help from another person eating meals?: A Little Help from another person taking care of personal grooming?: A Little Help from another person toileting,  which includes using toliet, bedpan, or urinal?: Total Help from another person bathing (including washing, rinsing, drying)?: A Lot Help from another person to put on and taking off regular upper body clothing?: A Little Help from another person to put on and taking off regular lower body clothing?: Total 6 Click Score: 13    End of Session Equipment Utilized During Treatment: Oxygen (2 liters)  OT Visit Diagnosis: Unsteadiness on feet (R26.81);Other abnormalities of gait and mobility (R26.89);Muscle weakness (generalized) (M62.81);Pain Pain - Right/Left: Left Pain - part of body: Leg (and back)   Activity Tolerance Patient tolerated treatment well   Patient Left in bed;with call bell/phone within reach   Nurse Communication Mobility status        Time: 4401-0272 OT Time Calculation (min): 24 min  Charges: OT General Charges $OT Visit: 1 Visit OT Treatments $Therapeutic Activity: 23-37 mins  Lodema Hong, [place]  Office [place] 09/04/2022, 12:23 PM

## 2022-09-05 DIAGNOSIS — Z7189 Other specified counseling: Secondary | ICD-10-CM | POA: Diagnosis not present

## 2022-09-05 DIAGNOSIS — Z515 Encounter for palliative care: Secondary | ICD-10-CM | POA: Diagnosis not present

## 2022-09-05 DIAGNOSIS — I614 Nontraumatic intracerebral hemorrhage in cerebellum: Secondary | ICD-10-CM | POA: Diagnosis not present

## 2022-09-05 LAB — BASIC METABOLIC PANEL
Anion gap: 11 (ref 5–15)
BUN: 9 mg/dL (ref 8–23)
CO2: 26 mmol/L (ref 22–32)
Calcium: 8.4 mg/dL — ABNORMAL LOW (ref 8.9–10.3)
Chloride: 101 mmol/L (ref 98–111)
Creatinine, Ser: 0.69 mg/dL (ref 0.44–1.00)
GFR, Estimated: >60 mL/min (ref >60)
Glucose, Bld: 120 mg/dL — ABNORMAL HIGH (ref 70–99)
Potassium: 3.8 mmol/L (ref 3.5–5.1)
Sodium: 138 mmol/L (ref 135–145)

## 2022-09-05 LAB — CBC
HCT: 37.2 % (ref 36.0–46.0)
Hemoglobin: 11.9 g/dL — ABNORMAL LOW (ref 12.0–15.0)
MCH: 29.3 pg (ref 26.0–34.0)
MCHC: 32 g/dL (ref 30.0–36.0)
MCV: 91.6 fL (ref 80.0–100.0)
Platelets: 325 10*3/uL (ref 150–400)
RBC: 4.06 MIL/uL (ref 3.87–5.11)
RDW: 14.8 % (ref 11.5–15.5)
WBC: 5.1 10*3/uL (ref 4.0–10.5)
nRBC: 0 % (ref 0.0–0.2)

## 2022-09-05 LAB — GLUCOSE, CAPILLARY
Glucose-Capillary: 120 mg/dL — ABNORMAL HIGH (ref 70–99)
Glucose-Capillary: 129 mg/dL — ABNORMAL HIGH (ref 70–99)
Glucose-Capillary: 130 mg/dL — ABNORMAL HIGH (ref 70–99)
Glucose-Capillary: 159 mg/dL — ABNORMAL HIGH (ref 70–99)
Glucose-Capillary: 190 mg/dL — ABNORMAL HIGH (ref 70–99)
Glucose-Capillary: 191 mg/dL — ABNORMAL HIGH (ref 70–99)

## 2022-09-05 NOTE — Progress Notes (Signed)
PROGRESS NOTE Heather [name]  HMC:[id number] DOB: [birth year] DOA: 08/04/2022 PCP: [name], FNP   Brief Narrative/Hospital Course: 72 y.o. female with PMH significant for DM2, neuropathy, HTN, HLD, COPD, GERD, PAD, AS s/p TAVR, chronic diastolic CHF, anxiety/depression/schizophrenia, tobacco abuse. 12/11, she presented to ED with sudden onset of dizziness, blurred vision, headache, nausea and vomiting. Blood pressure was significantly elevated to over 662 systolic with EMS. She was admitted to ICU for hemorrhagic stroke and bilateral lower extremity acute on chronic ischemia.Vascular surgery was consulted and s/p bilateral lower extremity femoral endarterectomy and patch angioplasty with iliac stenting, 4 compartment fasciotomies for acute on chronic lower extremity ischemia. Postop remained on vent and Cleviprex.Transferred to floor and TRH on 12/17. Hypoglycemic 08/30/2022 with blood sugar in the 50s, insulin adjusted Oral intake picking up patient did not want to have lumbar NG tube/feeding core track removed 1/10 Patient remains weak deconditioned unable to mobilize, needing wound care with [facility] change Monday Wednesday Friday Insurance has denied LTACH despite peer-to-peer review and approved for skilled nursing facility which family has agreed.  Subjective: Seen and examined this morning resting comfortably wound VAC dressing changed by wound care.   She reports -I am going home> explained in details why she needs to go to SNF given need for ongoing IV antibiotics as well as aggressive wound care  Assessment and Plan: Principal Problem:   ICH (intracerebral hemorrhage) ([name]) Active Problems:   Abscess of anal and rectal regions   Decubitus ulcer of sacral region, stage 2 ([name])   Pulmonary edema  Rhabdomyolysis Left AKA and excisional debridement of bilateral groin wounds with VAC placement 08/15/23 Bilateral CFA endarterectomy with bovine patch angioplasty Bilateral iliofemoral  embolectomy, stenting Bilateral CIA, stent Rt EIA and 4 compartment fasciotomies: Followed by vascular surgery on board.Patient with baseline Doppler flow right DP. S/P procedures as above.  Cont on wound VAC MWF on  bilateral groins and wound appears to be healing. She remains weak ,deconditionined> continues to mobilize. Continue Plavix/statin. Continue Ancef and Flagyl for E. coli and Proteus [name] in wound culture Will need total of 4 to 6 weeks of antibiotics per vascular surgery for possible graft infection> currently right IJ line in place since admission will need to switch to PICC line- will d/w Vascular. Did P2P w/ Dr Jerene Pitch: LTACH denied,she has approved for SNF and can approve for inpatient rehab if requested.  Acute HUT:MLYYTKP CT scan showed acute intraparenchymal hematoma within the cerebellar vermis measuring 11 cc in volume and mild mass effect upon the fourth ventricle. Confirmed with an MRI.  Seen by neurology.  Eventually placed on antiplatelet agents tolerating Plavix 75.  Acute respiratory failure with hypoxia COPD  Smoker : Extubated 12/14.respiratory status appears to be stable on 2 L nasal cannula to room air. Cont Brovana Pulmicort,Yupleri and albuterol as needed.   Chronic diastolic CHF HTN: BP well-controlled.  Volume status stable.Continue Coreg 12.5 twice daily, prn Hydralazine.  Monitor fluid status as below. Echo EF 60 to 60% on 12/11 w/  indeterminate diastolic parameters. Net IO Since Admission: 2,513.44 mL [09/05/22 0941]   Intake/Output Summary (Last 24 hours) at 09/05/2022 0941 Last data filed at 09/04/2022 2000 Gross per 24 hour  Intake 240 ml  Output 1300 ml  Net -1060 ml     Type 2 diabetes mellitus with uncontrolled hyperglycemia A1c poorly controlled 12.9: Cont long-acting insulin 5 units Semglee, Premeal insulin and SSI blood sugar overall stable. She has a brittle DM.  Recent Labs  Lab 09/04/22  1647 09/04/22 2035 09/05/22 0024  09/05/22 0438 09/05/22 0805  GLUCAP 90 243* 130* 120* 129*      Hx of CAD HLD AS s/p TAVR: Stable continue Plavix Lipitor.     Acute blood loss anemia in the setting of surgery: s/p 3 units PRBC so far.Hemoglobin stable as below Recent Labs  Lab 08/31/22 0103 09/02/22 0709 09/03/22 0054 09/04/22 0315 09/05/22 0644  HGB 8.1* 9.0* 8.9* 8.8* 11.9*  HCT 25.5* 28.4* 28.0* 28.8* 37.2     Depression, bipolar, schizophrenia, insomnia: mood has been stable.  Continue on Lexapro daily, Remeron nightly, trazodone nightly.,    Dysphagia:Likely related to [place].Has left nasal core track with ongoing tube feeds> T.  Has been on all oral intake picking up, continue dysphagia diet  as below. Off NG> SHE DOES NOT WANT to have tube feeding.  Pressure injury stage II sacrum POA see below: Pressure Injury 08/04/22 Sacrum Stage 2 -  Partial thickness loss of dermis presenting as a shallow open injury with a red, pink wound bed without slough. (Active)  08/04/22 0100  Location: Sacrum  Location Orientation:   Staging: Stage 2 -  Partial thickness loss of dermis presenting as a shallow open injury with a red, pink wound bed without slough.  Wound Description (Comments):   Present on Admission: Yes  Dressing Type Foam - Lift dressing to assess site every shift 08/31/22 1950   DVT prophylaxis: heparin injection 5,000 Units Start: 08/10/22 1600 Code Status:   Code Status: Full Code Family Communication: plan of care discussed with patient at bedside. Patient status is: inpatient  because of poor po intake Level of care: Telemetry Medical   Dispo: The patient is from: Home, w/. Husband, he is here daily            Anticipated disposition: SNF pending. LTACH denied  Objective: Vitals last 24 hrs: Vitals:   09/05/22 0749 09/05/22 0823 09/05/22 0824 09/05/22 0825  BP: (!) 145/44     Pulse: 79     Resp: 11     Temp: 98.8 F (37.1 C)     TempSrc: Oral     SpO2: 90% 92% 92% 92%  Weight:       Height:       Weight change: 1.361 kg  Physical Examination: General exam: AA0x3, weak,older appearing HEENT:Oral mucosa moist, Ear/Nose WNL grossly, dentition normal. Respiratory system: bilaterally clear BS,no use of accessory muscle Cardiovascular system: S1 & S2 +, regular rate,. Gastrointestinal system: Abdomen soft, NT,ND,BS+ Nervous System:Alert, awake, moving extremities and grossly nonfocal Extremities: Wound VAC in place in groins, LE AKA stump with intact staple dry, right surgical/incision site staples out Skin: No rashes,no icterus. MSK: Normal muscle bulk,tone, power   Medications reviewed:  Scheduled Meds:  arformoterol  15 mcg Nebulization BID   atorvastatin  80 mg Per Tube Daily   budesonide (PULMICORT) nebulizer solution  0.5 mg Nebulization BID   carvedilol  12.5 mg Per Tube BID WC   Chlorhexidine Gluconate Cloth  6 each Topical Q0600   clopidogrel  75 mg Oral Daily   escitalopram  10 mg Per Tube Daily   feeding supplement  237 mL Oral TID WC   Gerhardt's butt cream   Topical BID   heparin injection (subcutaneous)  5,000 Units Subcutaneous Q8H   insulin aspart  0-15 Units Subcutaneous Q4H   insulin aspart  2 Units Subcutaneous TID WC   insulin glargine-yfgn  5 Units Subcutaneous Daily   metroNIDAZOLE  500 mg  Per Tube Q12H   mirtazapine  7.5 mg Per Tube QHS   multivitamin with minerals  1 tablet Per Tube Daily   nicotine  14 mg Transdermal Daily   mouth rinse  15 mL Mouth Rinse Q4H   oxyCODONE  10 mg Oral Q12H   pantoprazole  40 mg Oral Daily   revefenacin  175 mcg Nebulization Daily   senna-docusate  1 tablet Oral QHS   sodium chloride flush  10-40 mL Intracatheter Q12H  Continuous Infusions:   ceFAZolin (ANCEF) IV 2 g (09/05/22 0849)   Diet Order             DIET DYS 3 Room service appropriate? Yes with Assist; Fluid consistency: Thin  Diet effective now                  Intake/Output Summary (Last 24 hours) at 09/05/2022 0941 Last data  filed at 09/04/2022 2000 Gross per 24 hour  Intake 240 ml  Output 1300 ml  Net -1060 ml    Net IO Since Admission: 2,513.44 mL [09/05/22 0941]  Wt Readings from Last 3 Encounters:  09/05/22 67.1 kg  08/03/22 58.7 kg  07/01/22 54.4 kg     Unresulted Labs (From admission, onward)     Start     Ordered   09/02/22 9390  Basic metabolic panel  Daily,   R     Question:  Specimen collection method  Answer:  Lab=Lab collect   09/01/22 0809   09/02/22 0500  CBC  Daily,   R     Question:  Specimen collection method  Answer:  Lab=Lab collect   09/01/22 0809          Data Reviewed: I have personally reviewed following labs and imaging studies CBC: Recent Labs  Lab 08/30/22 1133 08/31/22 0103 09/02/22 0709 09/03/22 0054 09/04/22 0315 09/05/22 0644  WBC 8.1 7.4 6.5 7.2 8.0 5.1  NEUTROABS 5.8 4.8  --   --   --   --   HGB 8.4* 8.1* 9.0* 8.9* 8.8* 11.9*  HCT 27.6* 25.5* 28.4* 28.0* 28.8* 37.2  MCV 95.2 93.8 93.4 91.8 95.7 91.6  PLT 500* 470* 454* 447* 443* 300    Basic Metabolic Panel: Recent Labs  Lab 08/31/22 0059 09/02/22 0709 09/03/22 0054 09/04/22 0315 09/05/22 0644  NA 136 136 137 138 138  K 4.2 3.7 3.5 3.5 3.8  CL 99 98 102 100 101  CO2 '28 29 30 29 26  '$ GLUCOSE 142* 146* 119* 74 120*  BUN '17 12 11 11 9  '$ CREATININE 0.74 0.67 0.78 0.73 0.69  CALCIUM 8.6* 8.9 8.7* 8.6* 8.4*   PQZ:RAQTMAUQJ Creatinine Clearance: 56.7 mL/min (by C-G formula based on SCr of 0.69 mg/dL). Liver Function Tests: No results for input(s): "AST", "ALT", "ALKPHOS", "BILITOT", "PROT", "ALBUMIN" in the last 168 hours. CBG: Recent Labs  Lab 09/04/22 1647 09/04/22 2035 09/05/22 0024 09/05/22 0438 09/05/22 0805  GLUCAP 90 243* 130* 120* 129*   No results found for this or any previous visit (from the past 240 hour(s)).  Antimicrobials: Anti-infectives (From admission, onward)    Start     Dose/Rate Route Frequency Ordered Stop   08/20/22 2200  metroNIDAZOLE (FLAGYL) IVPB 500 mg   Status:  Discontinued        500 mg 100 mL/hr over 60 Minutes Intravenous Every 12 hours 08/20/22 1110 08/20/22 1111   08/20/22 2200  metroNIDAZOLE (FLAGYL) tablet 500 mg        500 mg  Per Tube Every 12 hours 08/20/22 1111     08/18/22 2300  ceFAZolin (ANCEF) IVPB 2g/100 mL premix        2 g 200 mL/hr over 30 Minutes Intravenous Every 8 hours 08/18/22 1455     08/18/22 2300  metroNIDAZOLE (FLAGYL) IVPB 500 mg  Status:  Discontinued        500 mg 100 mL/hr over 60 Minutes Intravenous Every 6 hours 08/18/22 1455 08/20/22 1110   08/15/22 1100  vancomycin (VANCOCIN) IVPB 1000 mg/200 mL premix  Status:  Discontinued        1,000 mg 200 mL/hr over 60 Minutes Intravenous Every 24 hours 08/14/22 1006 08/15/22 1307   08/14/22 1400  piperacillin-tazobactam (ZOSYN) IVPB 3.375 g  Status:  Discontinued        3.375 g 12.5 mL/hr over 240 Minutes Intravenous Every 8 hours 08/14/22 1006 08/18/22 1455   08/14/22 1100  vancomycin (VANCOREADY) IVPB 1500 mg/300 mL        1,500 mg 150 mL/hr over 120 Minutes Intravenous  Once 08/14/22 1006 08/14/22 1502   08/04/22 1045  ceFAZolin (ANCEF) IVPB 2g/100 mL premix        2 g 200 mL/hr over 30 Minutes Intravenous On call to O.R. 08/04/22 0958 08/04/22 1100     Culture/Microbiology    Component Value Date/Time   SDES WOUND 08/14/2022 0836   SPECREQUEST GROIN 08/14/2022 0836   CULT  08/14/2022 0836    MODERATE ESCHERICHIA COLI RARE PROTEUS MIRABILIS MODERATE BACTEROIDES OVATUS MODERATE BACTEROIDES STERCORIS BETA LACTAMASE POSITIVE Performed at [facility], [place] [address] [place], [place] [address]    REPTSTATUS 08/17/2022 FINAL 08/14/2022 8264    Radiology Studies: No results found.   LOS: 32 days   Antonieta Pert, MD [facility]  09/05/2022, 9:41 AM

## 2022-09-05 NOTE — Progress Notes (Signed)
Nutrition Follow-up  DOCUMENTATION CODES:   Not applicable  INTERVENTION:   Encourage good PO intake  Assist in meal set-up at every meal Continue Multivitamin w/ minerals daily Continue Ensure Enlive po TID, each supplement provides 350 kcal and 20 grams of protein. Continue Magic cup TID with meals, each supplement provides 290 kcal and 9 grams of protein  NUTRITION DIAGNOSIS:   Increased nutrient needs related to wound healing as evidenced by estimated needs. - Ongoing   GOAL:   Patient will meet greater than or equal to 90% of their needs - Ongoing  MONITOR:   PO intake, Supplement acceptance, Diet advancement, Labs, Weight trends, TF tolerance, Skin, I & O's  REASON FOR ASSESSMENT:   Consult Assessment of nutrition requirement/status (Wean off ngt to po)  ASSESSMENT:   Pt with PMH of CHF, anxiety, PAD, bipolar, COPD, CAD, depression, DM with neuropathy, GERD, HTN, HLD, schizophrenia admitted with acute ICH and critical limb ischemia.  12/11: s/p b/l common femoral endarterectomy, iliofemoral endarterectomy, lower extremity embolectomy and stent of b/l common iliac arteries and Rt external iliac artery, and b/l 4 compartment fasciotomies  S/p cortrak placement; per xray tip in distal stomach or proximal duodenum  12/14: extubated 12/16: transferred from 4N to 4E 12/18: underwent FEES; advanced to dysphagia 2 diet with thin liquids per SLP, however there is a comment on the diet order for milk precautions: no milk to drink, no ice cream, milkshakes, milk-like supplements as pt had penetration with thin milk on study completed 12/19: plan per team was to discontinue tube feeds, but after discussing with MD regarding poor PO intake, changed to nocturnal tube feeds instead 12/21: s/p left AKA and shart excisional debridement of bilateral surgical groin wounds and wound VAC placement 12/22: seen by SLP and diet advanced to dysphagia 3 with thin liquids; per comment section of  this diet order no milk on tray but other dairy okay 12/27: diet downgraded to dysphagia 1 with thin liquids by MD per request of patient's family 12/29: SLP removed milk restrictions on tray, so now able to order Ensure supplements 12/31: diet upgraded from dysphagia 1 to dysphagia 3 1/4: overnight pt had rapid response for SOB and hypoxia and required BiPAP so tube feeds were held 1/5: overnight pt requested tube feeds be held due to feeling full 1/10: Cortrak removed   RD stopped in hall by palliative care provider, pt requesting pizza and wondering how to get it for her. RD informed provider and RN that pt currently on Dysphagia 3 diet and pizza is unavailable on that diet. PMT provider reaching out to SLP.  Pt sitting up in chair at time of visit. Reports that she wants pizza. RD encouraged good PO since Cortrak was removed. When asked about supplements pt states that she does not like the Ensure or Boost Breeze. RD again re-iterated the importance of proper nutrition.   Noted in [facility], pt continues to refuse all Ensure's offered.   Meal Intake  01/08-01/12: 0-90% x 8 meals (average 41%)  Medications reviewed and include: NovoLog SSI + 2 units, Semglee, Remeron, MVI, Protonix, Senokot-S, IV antibiotics  Labs reviewed: 24 hr CBGs 90-243  Diet Order:   Diet Order             DIET DYS 3 Room service appropriate? Yes with Assist; Fluid consistency: Thin  Diet effective now                   EDUCATION NEEDS:  Education needs have been addressed (Encouraged adequate intake of calories and protein and discussed importance of adequate intake.)  Skin:  Skin Assessment: Skin Integrity Issues: Skin Integrity Issues:: Stage II, Incisions, Wound VAC, Other (Comment) Stage II: sacrum Wound Vac: bilateral groin surgical wounds Incisions: closed incision to leg Other: surgical wounds bilateral groin with wound VAC  Last BM:  1/9  Height:  Ht Readings from Last 1 Encounters:   08/04/22 5' (1.524 m)   Weight:  Wt Readings from Last 1 Encounters:  09/05/22 67.1 kg   BMI:  Body mass index is 28.9 kg/m.  Estimated Nutritional Needs:  Kcal:  1700-1900 Protein:  100-125 grams Fluid:  >1.7 L/day   Hermina Barters RD, LDN Clinical Dietitian See [facility] for contact information.

## 2022-09-05 NOTE — Consult Note (Addendum)
[place] Nurse wound follow up;  Patient premedicated with pain medication prior to the procedure and tolerated with minimal amt discomfort Wound type: bilateral groin wounds full thickness post-op wounds, connected to NPWT with Y connector   Wound bed: Left groin; 90% Beefy red , 10% white fibrinous tissue present.   Right groin 95% beefy red, 5% white fibrinous tissue present.  White foam to base of both wounds, top with black foam, applied barrier ring to wound edges to attempt to maintain a seal since they are located in creases.   Drainage (amount, consistency, odor) small amt yellow serous Periwound: intact  skin surrounding Dressing procedure/placement/frequency: 1 piece white and 1 piece black foam removed, wound cleansed and 1 piece white and 1 piece black replaced into each wound. Covered with drape and TRAC pad.  Seal achieved at 125 mmHg continuous suction. [place] team will plan to to change Monday  Thank-you,  Julien Girt MSN, RN, [place], [place], [place]

## 2022-09-05 NOTE — Progress Notes (Signed)
Physical Therapy Treatment Patient Details Name: Heather [name] MRN: [id number] DOB: 1950-12-23 Today's Date: 09/05/2022   History of Present Illness Pt is a 72 y.o. female admitted 08/04/22 with headache, dizziness. 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  Found to have mottled LE's after given Narcdipine due to HTN; s/p emergent common femoral and iliofemoral endarterectomy and bilateral 4 compartment fasciotomies on 12/11. 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 PMH includes smoker(2ppd), severe AI/AS s/p TAVR, CAD s/p PCI and on DAPT, COPD, PAD, HTN.   PT Comments    Pt making incremental progress with mobility. Pt tolerated maximove lift OOB to recliner; from there, pt c/o back pain with anterior weight shifts limiting progress to RLE WB for transfer training this session. Pt demonstrates improving L hip strength and core activation. Pt remains limited by generalized weakness, decreased activity tolerance, poor balance strategies/postural reactions and impaired cognition. Continue to recommend SNF-level therapies to maximize functional mobility and independence prior to return home.    Recommendations for follow up therapy are one component of a multi-disciplinary discharge planning process, led by the attending physician.  Recommendations may be updated based on patient status, additional functional criteria and insurance authorization.  Follow Up Recommendations  Skilled nursing-short term rehab (<3 hours/day) Can patient physically be transported by private vehicle: No   Assistance Recommended at Discharge Frequent or constant Supervision/Assistance  Patient can return home with the following Two people to help with walking and/or transfers;Two people to help with bathing/dressing/bathroom;Help with stairs or ramp for entrance;Assist for transportation;Assistance with cooking/housework   Equipment Recommendations  Other (comment) (defer to  next venue)    Recommendations for Other Services Mobility Specialist - more frequent lifting out of bed      Precautions / Restrictions Precautions Precautions: Fall;Other (comment) Precaution Comments: bilateral groin wound vac; multiple buttocks wounds Restrictions Weight Bearing Restrictions: Yes RLE Weight Bearing: Weight bearing as tolerated LLE Weight Bearing: Non weight bearing     Mobility  Bed Mobility Overal bed mobility: Needs Assistance Bed Mobility: Rolling Rolling: Min assist         General bed mobility comments: rolling R/L with lift pad placement, use of bedrail and minA for trunk rotation; pt requires significant increased time to lay flat between rolls to reduce dizziness    Transfers Overall transfer level: Needs assistance Equipment used: Ambulation equipment used Transfers: Bed to chair/wheelchair/BSC             General transfer comment: transfer from bed to recliner with maximove lift, pt tolerated well. pt's R foot does not reach floor when sitting EOB or in recliner; standing trial with stedy deferred due to c/o back pain with forward leaning this session Transfer via Lift Equipment: Maximove  Ambulation/Gait                   Stairs             Wheelchair Mobility    Modified Rankin (Stroke Patients Only)       Balance Overall balance assessment: Needs assistance Sitting-balance support: Bilateral upper extremity supported, Feet unsupported, Single extremity supported Sitting balance-Leahy Scale: Poor Sitting balance - Comments: anterior weight translation in recliner, heavy reliance on BUE rail support to pull forward, limited by back pain  Cognition Arousal/Alertness: Awake/alert Behavior During Therapy: Flat affect Overall Cognitive Status: No family/caregiver present to determine baseline cognitive functioning Area of Impairment: Attention, Following commands,  Safety/judgement, Awareness, Problem solving                   Current Attention Level: Selective   Following Commands: Follows one step commands consistently Safety/Judgement: Decreased awareness of deficits Awareness: Emergent Problem Solving: Requires verbal cues, Slow processing General Comments: poor insight into deficits initially stating she could get to recliner without lift or assist        Exercises General Exercises - Lower Extremity Long Arc Quad: AROM, Right, Seated Hip Flexion/Marching: AROM, Right, Seated Amputee Exercises Hip ABduction/ADduction: AROM, Left, Seated Hip Flexion/Marching: AROM, Left, Seated    General Comments        Pertinent Vitals/Pain Pain Assessment Pain Assessment: Faces Faces Pain Scale: Hurts little more Pain Location: back Pain Descriptors / Indicators: Grimacing, Guarding, Discomfort Pain Intervention(s): Monitored during session, Limited activity within patient's tolerance, Repositioned    Home Living                          Prior Function            PT Goals (current goals can now be found in the care plan section) Progress towards PT goals: Progressing toward goals    Frequency    Min 2X/week      PT Plan Current plan remains appropriate    Co-evaluation              AM-PAC PT "6 Clicks" Mobility   Outcome Measure  Help needed turning from your back to your side while in a flat bed without using bedrails?: A Lot Help needed moving from lying on your back to sitting on the side of a flat bed without using bedrails?: A Lot Help needed moving to and from a bed to a chair (including a wheelchair)?: Total Help needed standing up from a chair using your arms (e.g., wheelchair or bedside chair)?: Total Help needed to walk in hospital room?: Total Help needed climbing 3-5 steps with a railing? : Total 6 Click Score: 8    End of Session   Activity Tolerance: Patient tolerated treatment  well;Patient limited by pain Patient left: in chair;with call bell/phone within reach Nurse Communication: Mobility status;Need for lift equipment PT Visit Diagnosis: Muscle weakness (generalized) (M62.81);Pain;Other abnormalities of gait and mobility (R26.89)     Time: 1031-5945 PT Time Calculation (min) (ACUTE ONLY): 24 min  Charges:  $Therapeutic Activity: 23-37 mins                     Mabeline Caras, PT, DPT Acute Rehabilitation Services  Personal: [place] [facility] Office: [place] 09/05/2022, 12:33 PM

## 2022-09-05 NOTE — TOC Progression Note (Signed)
Transition of Care [facility]) - Progression Note    Patient Details  Name: Heather [name] MRN: [id number] Date of Birth: 14-Nov-1950  Transition of [facility]) CM/SW Contact  Levonne Lapping, RN Phone Number: 09/05/2022, 12:34 PM  Clinical Narrative:     Notified by MD that peer to peer was completed and Patient was still denied LTACH.  CIR reviewed for appropriateness and she is not  a candidate for inpatient therapy. SNF now only option. Daughter, Garner Nash contacted and is agreeable to SNF search and placement . LCSW aware. TOC will continue to follow patient for any additional discharge needs      Expected Discharge Plan: [place] Barriers to Discharge: [place] (PASRR), Insurance Authorization, Continued Medical Work up, SNF Pending bed offer (cortrak)  Expected Discharge Plan and Services In-house Referral: Clinical Social Work   Post Acute Care Choice: [place] (LTAC) Living arrangements for the past 2 months: [place] Determinants of Health (SDOH) Interventions [place]: Food Insecurity Present (08/11/2022)  Housing: Low Risk  (08/11/2022)  Transportation Needs: No Transportation Needs (08/11/2022)  Utilities: Not At Risk (08/11/2022)  Financial Resource Strain: Medium Risk (10/19/2018)  Physical Activity: Inactive (10/19/2018)  Social Connections: Unknown (10/19/2018)  Tobacco Use: High Risk (08/15/2022)    Readmission Risk Interventions    11/13/2020    2:27 PM  Readmission Risk Prevention Plan  Transportation Screening Complete  PCP or Specialist Appt within 3-5 Days Complete  HRI or [place] Complete  Social Work Consult for [place] Planning/Counseling Complete  Palliative Care Screening Not Applicable  Medication Review Press photographer) Complete

## 2022-09-05 NOTE — Progress Notes (Signed)
Palliative Medicine Inpatient Follow Up Note  HPI:  Heather [name] is a 72 y.o. female with PMH significant for DM2, neuropathy, HTN, HLD, COPD, GERD, PAD, AS s/p TAVR, chronic diastolic CHF, anxiety/depression/schizophrenia, tobacco abuse. ? AKA 12/21. Is chronically ill and has a poor long term prognosis. The Palliative care team has been asked to get involved for further goals of care conversations.    Today's Discussion 09/05/2022  *Please note that this is a verbal dictation therefore any spelling or grammatical errors are due to the "[place] One" system interpretation.  Chart reviewed inclusive of vital signs, progress notes, laboratory results, and diagnostic images.   I met with Heather [name] at bedside this afternoon. She and I discussed her week and tolerance to therapies. She shares that her appetite is picking up and her nurse got her a pizza this afternoon which she was able to eat a whole piece of.   We reviewed the plan for rehabilitation - patient remain ambivalent about this.  Created space and opportunity for patient to explore thoughts feelings and fears regarding current medical situation.  Confirmed patients desire for full code and full scope of treatment. In addition she maintains that she would like her husband, Heather [name] to be her primary Media planner. She shares that she realizes he suffers from dementia but does want him to make decisions for her as of now. She plans to speak more to he and her daughters once outside of the hospital regarding this as well.   ______________________________ Addendum:  I called and spoke with patients daughter, Heather [name]. We discussed that Heather [name] is presently desiring her husband to be her Media planner. Heather [name] shares that she understands and will continue to discuss this with her mother.   Questions and concerns addressed/Palliative Support Provided.   Objective Assessment: Vital Signs Vitals:   09/05/22 0824 09/05/22 0825  BP:     Pulse:    Resp:    Temp:    SpO2: 92% 92%    Intake/Output Summary (Last 24 hours) at 09/05/2022 1525 Last data filed at 09/05/2022 1451 Gross per 24 hour  Intake 240 ml  Output 2000 ml  Net -1760 ml   Last Weight  Most recent update: 09/05/2022  6:54 AM    Weight  67.1 kg (148 lb)            Gen:  Older Caucasian F in NAD HEENT: moist mucous membranes CV: Regular rate and rhythm  PULM: ON 2LPM [place] ABD: soft/nontender  EXT: L AKA Neuro: Alert and oriented x3  SUMMARY OF RECOMMENDATIONS   FULL CODE   Patient continues to want her spouse Heather [name] to be her Media planner  Goals for improvement  Plan for SNF placement   PMT to continue following along peripherally   Code Status/Advance Care Planning: FULL CODE   Symptom Management:  Pain: - Continue oxycodone '5mg'$  PO Q4H PRN - Continue Oxycontin '10mg'$  PO Q12H  - Dilaudid 0.'5mg'$ -'1mg'$  IV Q4H PRN severe pain   Adult FTT - - Appreciate Dietician involvement - Agree with mirtazapine - 1:1 feeding  Billing based on MDM: High  Problems Addressed: One acute or chronic illness or injury that poses a threat to life or bodily function  Amount and/or Complexity of Data: Category 3:Discussion of management or test interpretation with external physician/other qualified health care professional/appropriate source (not separately reported)  Risks: Decision regarding hospitalization or escalation of hospital care and Decision not to resuscitate or to de-escalate care because of poor prognosis  ______________________________________________________________________________________ [place] Team Team Cell Phone: [phone] Please utilize secure chat with additional questions, if there is no response within 30 minutes please call the above phone number  Palliative Medicine Team providers are available by phone from 7am to 7pm daily and can be reached through the team cell phone.  Should  this patient require assistance outside of these hours, please call the patient's attending physician.

## 2022-09-05 NOTE — Progress Notes (Signed)
PT Cancellation Note  Patient Details Name: Heather [name] MRN: [id number] DOB: 03-Jun-1951   Cancelled Treatment:    Reason Eval/Treat Not Completed: Patient eating breakfast, reports needing another 30 min to complete; but reports hopeful to transfer to recliner today. Will follow up for PT treatment as schedule permits.  Mabeline Caras, PT, DPT Acute Rehabilitation Services  Personal: [place] [facility] Office: [place] 09/05/2022, 9:26 AM

## 2022-09-06 DIAGNOSIS — Z515 Encounter for palliative care: Secondary | ICD-10-CM | POA: Diagnosis not present

## 2022-09-06 DIAGNOSIS — Z7189 Other specified counseling: Secondary | ICD-10-CM | POA: Diagnosis not present

## 2022-09-06 DIAGNOSIS — I614 Nontraumatic intracerebral hemorrhage in cerebellum: Secondary | ICD-10-CM | POA: Diagnosis not present

## 2022-09-06 LAB — CBC
HCT: 29.2 % — ABNORMAL LOW (ref 36.0–46.0)
Hemoglobin: 9.4 g/dL — ABNORMAL LOW (ref 12.0–15.0)
MCH: 29.7 pg (ref 26.0–34.0)
MCHC: 32.2 g/dL (ref 30.0–36.0)
MCV: 92.1 fL (ref 80.0–100.0)
Platelets: 413 10*3/uL — ABNORMAL HIGH (ref 150–400)
RBC: 3.17 MIL/uL — ABNORMAL LOW (ref 3.87–5.11)
RDW: 15.1 % (ref 11.5–15.5)
WBC: 7.5 10*3/uL (ref 4.0–10.5)
nRBC: 0 % (ref 0.0–0.2)

## 2022-09-06 LAB — GLUCOSE, CAPILLARY
Glucose-Capillary: 104 mg/dL — ABNORMAL HIGH (ref 70–99)
Glucose-Capillary: 107 mg/dL — ABNORMAL HIGH (ref 70–99)
Glucose-Capillary: 128 mg/dL — ABNORMAL HIGH (ref 70–99)
Glucose-Capillary: 144 mg/dL — ABNORMAL HIGH (ref 70–99)
Glucose-Capillary: 173 mg/dL — ABNORMAL HIGH (ref 70–99)
Glucose-Capillary: 177 mg/dL — ABNORMAL HIGH (ref 70–99)
Glucose-Capillary: 177 mg/dL — ABNORMAL HIGH (ref 70–99)

## 2022-09-06 LAB — BASIC METABOLIC PANEL
Anion gap: 11 (ref 5–15)
BUN: 9 mg/dL (ref 8–23)
CO2: 26 mmol/L (ref 22–32)
Calcium: 8.4 mg/dL — ABNORMAL LOW (ref 8.9–10.3)
Chloride: 101 mmol/L (ref 98–111)
Creatinine, Ser: 0.76 mg/dL (ref 0.44–1.00)
GFR, Estimated: >60 mL/min (ref >60)
Glucose, Bld: 129 mg/dL — ABNORMAL HIGH (ref 70–99)
Potassium: 3.5 mmol/L (ref 3.5–5.1)
Sodium: 138 mmol/L (ref 135–145)

## 2022-09-06 MED ORDER — DRONABINOL 2.5 MG PO CAPS
2.5000 mg | ORAL_CAPSULE | Freq: Two times a day (BID) | ORAL | Status: DC
  Administered 2022-09-08 – 2022-09-22 (×29): 2.5 mg via ORAL
  Filled 2022-09-06 (×29): qty 1

## 2022-09-06 MED ORDER — SODIUM CHLORIDE 0.9% FLUSH
10.0000 mL | Freq: Two times a day (BID) | INTRAVENOUS | Status: DC
  Administered 2022-09-07 – 2022-09-22 (×19): 10 mL

## 2022-09-06 NOTE — Progress Notes (Signed)
PROGRESS NOTE Heather [name]  WPY:[id number] DOB: 16-Apr-1951 DOA: 08/04/2022 PCP: [name], FNP   Brief Narrative/Hospital Course: 72 y.o. female with PMH significant for DM2, neuropathy, HTN, HLD, COPD, GERD, PAD, AS s/p TAVR, chronic diastolic CHF, anxiety/depression/schizophrenia, tobacco abuse. 12/11, she presented to ED with sudden onset of dizziness, blurred vision, headache, nausea and vomiting. Blood pressure was significantly elevated to over 053 systolic with EMS. She was admitted to ICU for hemorrhagic stroke and bilateral lower extremity acute on chronic ischemia.Vascular surgery was consulted and s/p bilateral lower extremity femoral endarterectomy and patch angioplasty with iliac stenting, 4 compartment fasciotomies for acute on chronic lower extremity ischemia. Postop remained on vent and Cleviprex.Transferred to floor and TRH on 12/17. Hypoglycemic 08/30/2022 with blood sugar in the 50s, insulin adjusted Oral intake picking up patient did not want to have lumbar NG tube/feeding core track removed 1/10 Patient remains weak deconditioned unable to mobilize, needing wound care with [facility] change Monday Wednesday Friday Insurance has denied LTACH despite peer-to-peer review and approved for skilled nursing facility which family has agreed.  Subjective: Seen and examined. Resting comfortably eating well, has no complaints. Overnight patient has been afebrile.    Assessment and Plan: Principal Problem:   ICH (intracerebral hemorrhage) (HCC) Active Problems:   Abscess of anal and rectal regions   Decubitus ulcer of sacral region, stage 2 (HCC)   Pulmonary edema  Rhabdomyolysis Left AKA and excisional debridement of bilateral groin wounds with VAC placement 08/15/23 Bilateral CFA endarterectomy with bovine patch angioplasty Bilateral iliofemoral embolectomy, stenting Bilateral CIA, stent Rt EIA and 4 compartment fasciotomies: Followed by vascular surgery on board.Patient with  baseline Doppler flow right DP. S/P procedures as above.  Cont on wound VAC MWF on  bilateral groins and wound appears to be healing. She remains weak ,deconditionined> continues to mobilize. Continue Plavix/statin. Continue Ancef and Flagyl for E. coli and Proteus [name] in wound culture I discussed w Dr Carlis Abbott- reviewed OR note and due to bovine patch: he advised to keep total 6 weeks of antibiotics for possible graft infection> currently right IJ line in place since admission will need to switch to PICC line before d/c> consult IV team.  Acute ZJQ:[id number] CT scan showed acute intraparenchymal hematoma within the cerebellar vermis measuring 11 cc in volume and mild mass effect upon the fourth ventricle. Confirmed with an MRI.  Seen by neurology.  Eventually placed on antiplatelet agents tolerating Plavix 75.  Acute respiratory failure with hypoxia COPD  Smoker : Extubated 12/14> doing well on room air since at times on 2 L nasal cannula.Cont [place] Pulmicort,Yupleri and albuterol as needed.   Chronic diastolic CHF HTN: BP well-controlled.Volume status stable.Echo EF 60 to 60% on 12/11 w/  indeterminate diastolic parameters. Continue Coreg, prn Hydralazine.  Monitor fluid status as below Net IO Since Admission: 1,613.44 mL [09/06/22 0744] Intake/Output Summary (Last 24 hours) at 09/06/2022 0744 Last data filed at 09/05/2022 2000 Gross per 24 hour  Intake 100 ml  Output 1000 ml  Net -900 ml     Type 2 diabetes mellitus with uncontrolled hyperglycemia A1c poorly controlled 12.9: Blood sugar remains controlled, continue long-acting insulin 5 units Semglee, Premeal insulin and SSI .She has a brittle DM.  Recent Labs  Lab 09/05/22 1541 09/05/22 2103 09/06/22 0030 09/06/22 0434 09/06/22 0729  GLUCAP 191* 159* 104* 107* 144*      Hx of CAD HLD AS s/p TAVR: Stable, no chest pain.  Continue Plavix Lipitor.     Acute blood loss  anemia in the setting of surgery: s/p 3 units PRBC so  far.Hemoglobin stable as below Recent Labs  Lab 09/02/22 0709 09/03/22 0054 09/04/22 0315 09/05/22 0644 09/06/22 0617  HGB 9.0* 8.9* 8.8* 11.9* 9.4*  HCT 28.4* 28.0* 28.8* 37.2 29.2*     Depression, bipolar, schizophrenia, insomnia: mood has been stable.  Continue on Lexapro daily, Remeron nightly, trazodone nightly.,    Dysphagia:Likely related to [place].core track has been removed.  Tolerating p.o. well augment diet, she does not want to have NG tube or tube feeding  Pressure injury stage II sacrum POA see below: Pressure Injury 08/04/22 Sacrum Stage 2 -  Partial thickness loss of dermis presenting as a shallow open injury with a red, pink wound bed without slough. (Active)  08/04/22 0100  Location: Sacrum  Location Orientation:   Staging: Stage 2 -  Partial thickness loss of dermis presenting as a shallow open injury with a red, pink wound bed without slough.  Wound Description (Comments):   Present on Admission: Yes  Dressing Type Foam - Lift dressing to assess site every shift 08/31/22 1950   DVT prophylaxis: heparin injection 5,000 Units Start: 08/10/22 1600 Code Status:   Code Status: Full Code Family Communication: plan of care discussed with patient at bedside. Patient status is: inpatient  because of poor po intake Level of care: Telemetry Medical   Dispo: The patient is from: Home, w/. Husband, he is here daily            Anticipated disposition: SNF pending. LTACH denied  Objective: Vitals last 24 hrs: Vitals:   09/05/22 2104 09/06/22 0031 09/06/22 0432 09/06/22 0726  BP: (!) 172/95 (!) 155/45 (!) 179/50 (!) 160/64  Pulse: 69 73 72 83  Resp: '13 16 14 16  '$ Temp: 98.2 F (36.8 C) 98.9 F (37.2 C) 98.5 F (36.9 C) 98.9 F (37.2 C)  TempSrc: Oral Oral Oral Oral  SpO2: 96% 93% 91% 90%  Weight:   63 kg   Height:       Weight change: -4.082 kg  Physical Examination: General exam: AA, weak,older appearing HEENT:Oral mucosa moist, Ear/Nose WNL grossly, dentition  normal. Respiratory system: bilaterally clear BS, no use of accessory muscle Cardiovascular system: S1 & S2 +, regular rate. Gastrointestinal system: Abdomen soft, NT,ND,BS+ Nervous System:Alert, awake, moving extremities and grossly nonfocal Extremities: B/L GROIN W/ WOUND VAC, RLE  incision site healed, Lt AKA stump w/ staples- DRY clean Skin: No rashes,no icterus. MSK: Normal muscle bulk,tone, power  Central line on rt  Medications reviewed:  Scheduled Meds:  arformoterol  15 mcg Nebulization BID   atorvastatin  80 mg Per Tube Daily   budesonide (PULMICORT) nebulizer solution  0.5 mg Nebulization BID   carvedilol  12.5 mg Per Tube BID WC   Chlorhexidine Gluconate Cloth  6 each Topical Q0600   clopidogrel  75 mg Oral Daily   dronabinol  2.5 mg Oral BID AC   escitalopram  10 mg Per Tube Daily   feeding supplement  237 mL Oral TID WC   Gerhardt's butt cream   Topical BID   heparin injection (subcutaneous)  5,000 Units Subcutaneous Q8H   insulin aspart  0-15 Units Subcutaneous Q4H   insulin aspart  2 Units Subcutaneous TID WC   insulin glargine-yfgn  5 Units Subcutaneous Daily   metroNIDAZOLE  500 mg Per Tube Q12H   mirtazapine  7.5 mg Per Tube QHS   multivitamin with minerals  1 tablet Per Tube Daily   nicotine  14 mg Transdermal Daily   mouth rinse  15 mL Mouth Rinse Q4H   oxyCODONE  10 mg Oral Q12H   pantoprazole  40 mg Oral Daily   revefenacin  175 mcg Nebulization Daily   senna-docusate  1 tablet Oral QHS   sodium chloride flush  10-40 mL Intracatheter Q12H  Continuous Infusions:   ceFAZolin (ANCEF) IV 2 g (09/06/22 0453)   Diet Order             DIET DYS 3 Room service appropriate? Yes with Assist; Fluid consistency: Thin  Diet effective now                  Intake/Output Summary (Last 24 hours) at 09/06/2022 0744 Last data filed at 09/05/2022 2000 Gross per 24 hour  Intake 100 ml  Output 1000 ml  Net -900 ml    Net IO Since Admission: 1,613.44 mL  [09/06/22 0744]  Wt Readings from Last 3 Encounters:  09/06/22 63 kg  08/03/22 58.7 kg  07/01/22 54.4 kg     Unresulted Labs (From admission, onward)    None     Data Reviewed: I have personally reviewed following labs and imaging studies CBC: Recent Labs  Lab 08/30/22 1133 08/31/22 0103 09/02/22 0709 09/03/22 0054 09/04/22 0315 09/05/22 0644 09/06/22 0617  WBC 8.1 7.4 6.5 7.2 8.0 5.1 7.5  NEUTROABS 5.8 4.8  --   --   --   --   --   HGB 8.4* 8.1* 9.0* 8.9* 8.8* 11.9* 9.4*  HCT 27.6* 25.5* 28.4* 28.0* 28.8* 37.2 29.2*  MCV 95.2 93.8 93.4 91.8 95.7 91.6 92.1  PLT 500* 470* 454* 447* 443* 325 413*    Basic Metabolic Panel: Recent Labs  Lab 09/02/22 0709 09/03/22 0054 09/04/22 0315 09/05/22 0644 09/06/22 0617  NA 136 137 138 138 138  K 3.7 3.5 3.5 3.8 3.5  CL 98 102 100 101 101  CO2 '29 30 29 26 26  '$ GLUCOSE 146* 119* 74 120* 129*  BUN '12 11 11 9 9  '$ CREATININE 0.67 0.78 0.73 0.69 0.76  CALCIUM 8.9 8.7* 8.6* 8.4* 8.4*   QQV:ZDGLOVFIE Creatinine Clearance: 55 mL/min (by C-G formula based on SCr of 0.76 mg/dL). Liver Function Tests: No results for input(s): "AST", "ALT", "ALKPHOS", "BILITOT", "PROT", "ALBUMIN" in the last 168 hours. CBG: Recent Labs  Lab 09/05/22 1541 09/05/22 2103 09/06/22 0030 09/06/22 0434 09/06/22 0729  GLUCAP 191* 159* 104* 107* 144*   No results found for this or any previous visit (from the past 240 hour(s)).  Antimicrobials: Anti-infectives (From admission, onward)    Start     Dose/Rate Route Frequency Ordered Stop   08/20/22 2200  metroNIDAZOLE (FLAGYL) IVPB 500 mg  Status:  Discontinued        500 mg 100 mL/hr over 60 Minutes Intravenous Every 12 hours 08/20/22 1110 08/20/22 1111   08/20/22 2200  metroNIDAZOLE (FLAGYL) tablet 500 mg        500 mg Per Tube Every 12 hours 08/20/22 1111     08/18/22 2300  ceFAZolin (ANCEF) IVPB 2g/100 mL premix        2 g 200 mL/hr over 30 Minutes Intravenous Every 8 hours 08/18/22 1455      08/18/22 2300  metroNIDAZOLE (FLAGYL) IVPB 500 mg  Status:  Discontinued        500 mg 100 mL/hr over 60 Minutes Intravenous Every 6 hours 08/18/22 1455 08/20/22 1110   08/15/22 1100  vancomycin (VANCOCIN) IVPB 1000 mg/200  mL premix  Status:  Discontinued        1,000 mg 200 mL/hr over 60 Minutes Intravenous Every 24 hours 08/14/22 1006 08/15/22 1307   08/14/22 1400  piperacillin-tazobactam (ZOSYN) IVPB 3.375 g  Status:  Discontinued        3.375 g 12.5 mL/hr over 240 Minutes Intravenous Every 8 hours 08/14/22 1006 08/18/22 1455   08/14/22 1100  vancomycin (VANCOREADY) IVPB 1500 mg/300 mL        1,500 mg 150 mL/hr over 120 Minutes Intravenous  Once 08/14/22 1006 08/14/22 1502   08/04/22 1045  ceFAZolin (ANCEF) IVPB 2g/100 mL premix        2 g 200 mL/hr over 30 Minutes Intravenous On call to O.R. 08/04/22 0958 08/04/22 1100     Culture/Microbiology    Component Value Date/Time   SDES WOUND 08/14/2022 0836   SPECREQUEST GROIN 08/14/2022 0836   CULT  08/14/2022 0836    MODERATE ESCHERICHIA COLI RARE PROTEUS MIRABILIS MODERATE BACTEROIDES OVATUS MODERATE BACTEROIDES STERCORIS BETA LACTAMASE POSITIVE Performed at [facility], [place] [address], [place], [place] [address]    REPTSTATUS 08/17/2022 FINAL 08/14/2022 8592    Radiology Studies: No results found.   LOS: 33 days   Antonieta Pert, MD [facility]  09/06/2022, 7:44 AM

## 2022-09-06 NOTE — Progress Notes (Signed)
A midline has been ordered for this patient. The VAST RN has reviewed the patient's medical record including any arm restrictions, current creatinine clearance, length IV therapy is needed, and infusions needed/ordered to determine if a midline is the appropriate line for this individual patient. If there are contraindications, the physician and primary RN has been contacted by [facility] RN for further discussion. Midline Education: a midline is a long peripheral IV placed in the upper arm with the tip located at or near the axilla and distal to the shoulder should not be used as a CLABSI preventative measure   it has one lumen only it can remain in place for up to 29 days  Safe for Vancomycin infusion LESS THAN 6 days Is safe for power injection if good blood return can be obtained and line flushes easily it CANNOT be used for continuous infusion of vesicants. Including TPN and chemotherapy Should NOT be placed for sole intent of obtaining labs as there is no guarantee blood can be successfully drawn from line  contraindicated in patients with thrombosis, hypercoagulability, decreased venous flow to the extremities, ESRD (without a nephrologist's approval), small vessels, allergy to polyurethane, or known/suspected presence of a device-related infection, bacteremia, or septicemia  HS Johnte Portnoy RN 

## 2022-09-06 NOTE — Progress Notes (Signed)
   Palliative Medicine Inpatient Follow Up Note HPI:  Heather [name] is a 72 y.o. female with PMH significant for DM2, neuropathy, HTN, HLD, COPD, GERD, PAD, AS s/p TAVR, chronic diastolic CHF, anxiety/depression/schizophrenia, tobacco abuse. ? AKA 12/21. Is chronically ill and has a poor long term prognosis. The Palliative care team has been asked to get involved for further goals of care conversations.    Today's Discussion 09/06/2022  *Please note that this is a verbal dictation therefore any spelling or grammatical errors are due to the "[place]" system interpretation.  Chart reviewed inclusive of vital signs, progress notes, laboratory results, and diagnostic images.   I spoke to Heather [name], Heather [name] this morning. She shares that the patient at some yesterday though not tremendously.   I met with Heather [name] at bedside this morning she was resting in bed in some distress. She shares that she is having generalized pain. Reviewed the medications she can get for this. She does feel the PRN oxycodone helps with her pain. We reviewed and discussed the idea of dilaudid if absolutely needed. I shared this will not be given at SNF which is why we try to minimize it's use as patients get closer to discharge.  Heather [name] remains to have very little appetite. I shared that I would start a medication to see if this can stimulate PO intake. Reviewed the importance of nutrition for wound health and strength building.   Questions and concerns addressed/Palliative Support Provided.   Objective Assessment: Vital Signs Vitals:   09/06/22 0031 09/06/22 0432  BP: (!) 155/45 (!) 179/50  Pulse: 73 72  Resp: 16 14  Temp: 98.9 F (37.2 C) 98.5 F (36.9 C)  SpO2: 93% 91%    Intake/Output Summary (Last 24 hours) at 09/06/2022 4854 Last data filed at 09/05/2022 2000 Gross per 24 hour  Intake 100 ml  Output 1000 ml  Net -900 ml    Last Weight  Most recent update: 09/06/2022  4:33 AM    Weight  63 kg  (139 lb)            Gen:  Older Caucasian F in NAD HEENT: moist mucous membranes CV: Regular rate and rhythm  PULM: ON 2LPM [place] ABD: soft/nontender  EXT: L AKA Neuro: Alert and oriented x3  SUMMARY OF RECOMMENDATIONS   FULL CODE   Patient continues to want her spouse Heather [name] to be her Media planner  Goals for improvement  Plan for SNF placement   PMT to continue following along peripherally   Code Status/Advance Care Planning: FULL CODE   Symptom Management:  Pain: - Continue oxycodone '5mg'$  PO Q4H PRN - Continue Oxycontin '10mg'$  PO Q12H  - Dilaudid 0.'5mg'$ -'1mg'$  IV Q4H PRN severe pain   Adult FTT - - Appreciate Dietician involvement - Agree with mirtazapine - Added marinol 2.'5mg'$  QAC BID - 1:1 feeding  Billing based on MDM: High _________________________________ [place] Team Team Cell Phone: [phone] Please utilize secure chat with additional questions, if there is no response within 30 minutes please call the above phone number  Palliative Medicine Team providers are available by phone from 7am to 7pm daily and can be reached through the team cell phone.  Should this patient require assistance outside of these hours, please call the patient's attending physician.

## 2022-09-06 NOTE — Progress Notes (Signed)
Charge RN says patient's family called stating that patient has been lying in stool for over an hour.  Patient states she has been trying calling for the bed pan for over an hour and she is lying in her stool for over an hour. In fact patient was found to have had a small smear like bowel movement which does not call for a bedpan. Patient was not lying in stool. Educated the patient on a plan to check her every 2 hours for a bowel movement as patient calls often for the bedpan when she is only having gas or has produced only a smear of a movement that does not necessitate a bedpan. Patient is agreeable with plan. Will continue to monitor.

## 2022-09-07 DIAGNOSIS — Z7189 Other specified counseling: Secondary | ICD-10-CM | POA: Diagnosis not present

## 2022-09-07 DIAGNOSIS — Z515 Encounter for palliative care: Secondary | ICD-10-CM | POA: Diagnosis not present

## 2022-09-07 DIAGNOSIS — I614 Nontraumatic intracerebral hemorrhage in cerebellum: Secondary | ICD-10-CM | POA: Diagnosis not present

## 2022-09-07 LAB — GLUCOSE, CAPILLARY
Glucose-Capillary: 113 mg/dL — ABNORMAL HIGH (ref 70–99)
Glucose-Capillary: 115 mg/dL — ABNORMAL HIGH (ref 70–99)
Glucose-Capillary: 136 mg/dL — ABNORMAL HIGH (ref 70–99)
Glucose-Capillary: 149 mg/dL — ABNORMAL HIGH (ref 70–99)
Glucose-Capillary: 166 mg/dL — ABNORMAL HIGH (ref 70–99)
Glucose-Capillary: 97 mg/dL (ref 70–99)

## 2022-09-07 NOTE — Progress Notes (Addendum)
Palliative Medicine Inpatient Follow Up Note HPI:  Heather [name] is a 72 y.o. female with PMH significant for DM2, neuropathy, HTN, HLD, COPD, GERD, PAD, AS s/p TAVR, chronic diastolic CHF, anxiety/depression/schizophrenia, tobacco abuse. ? AKA 12/21. Is chronically ill and has a poor long term prognosis. The Palliative care team has been asked to get involved for further goals of care conversations.    Today's Discussion 09/07/2022  *Please note that this is a verbal dictation therefore any spelling or grammatical errors are due to the "[place] One" system interpretation.  Chart reviewed inclusive of vital signs, progress notes, laboratory results, and diagnostic images.   I met with Preesha and her daughter, Edwinna Areola this afternoon. We called her other daughter, [name] on speaker-phone. We had a long conversation regarding patients prolonged hospitalization. Reviewed the concern regarding patients poor oral intake. Desi shares that at home her mother eats little to nothing and essentially lives off of bone broth and shakes. Discussed the idea of a PEG which Caroline is adamantly against. She shares that she will work on increasing her oral intake. She has to date not wanted to take marinol - encouraged to try this.   Reviewed with patient and her daughters how defeating this hospitalization has been for her. Discussed that she is ready to discharge one a place is identified for her to transition to. She does not desire staying in a SNF long term. She feels that she can care for herself alone now. Provided education on the rehab journey post amputation. Discussed the long and hard road ahead. Attempted to provide motivation through education. Patient does endorse willingness to work with rehab.  Explained the differences between and ALF and SNF to patient and her daughters.   Discussed again, this importance of advanced care planning. Patient maintains the desire for her spouse to be her decision  maker.   I was able to speak to patients RN, Estill Bamberg who shares that Nikitha has not wanted to be mobile. Asked if it would be possible to get her in the lift and to the chair so her daughter could push her around the unit given how down patients spirits are.   Questions and concerns addressed/Palliative Support Provided.  ____________________________________ Addendum:  I met at bedside with nursing staff this evening. An attempt was made to get patient OOB. She complained of dizziness and overall weakness. We were unable to complete getting her up though she was able to sit at bedside for roughly 15 minutes with the bed in a seated position.   Additional Time: 47 minutes  Objective Assessment: Vital Signs Vitals:   09/07/22 0745 09/07/22 0757  BP:  (!) 170/87  Pulse:  89  Resp:  14  Temp:  98.9 F (37.2 C)  SpO2: 94% 98%    Intake/Output Summary (Last 24 hours) at 09/07/2022 1528 Last data filed at 09/07/2022 0600 Gross per 24 hour  Intake --  Output 2150 ml  Net -2150 ml    Last Weight  Most recent update: 09/07/2022  3:54 AM    Weight  63 kg (139 lb)            Gen:  Older Caucasian F in NAD HEENT: moist mucous membranes CV: Regular rate and rhythm  PULM: On RA breathing even and nonlabored ABD: soft/nontender  EXT: L AKA Neuro: Alert and oriented x3  SUMMARY OF RECOMMENDATIONS   FULL CODE  Patient is adamantly against PEG placement   Patient continues to want her  spouse [name] to be her Media planner  Goals for improvement  Plan for SNF placement   PMT to continue following along peripherally   Code Status/Advance Care Planning: FULL CODE   Symptom Management:  Pain: - Continue oxycodone '5mg'$  PO Q4H PRN - Continue Oxycontin '10mg'$  PO Q12H  - Dilaudid 0.'5mg'$ -'1mg'$  IV Q4H PRN severe pain   Adult FTT - - Appreciate Dietician involvement - Agree with mirtazapine - Added marinol 2.'5mg'$  QAC BID --> Patient has been refusing this - 1:1 feeding  Billing  based on MDM: High _________________________________ [place] Team Team Cell Phone: [phone] Please utilize secure chat with additional questions, if there is no response within 30 minutes please call the above phone number  Palliative Medicine Team providers are available by phone from 7am to 7pm daily and can be reached through the team cell phone.  Should this patient require assistance outside of these hours, please call the patient's attending physician.

## 2022-09-07 NOTE — Progress Notes (Signed)
PROGRESS NOTE Heather [name]  QMV:[id number] DOB: [birth year] DOA: 08/04/2022 PCP: [name], FNP   Brief Narrative/Hospital Course: 72 y.o. female with PMH significant for DM2, neuropathy, HTN, HLD, COPD, GERD, PAD, AS s/p TAVR, chronic diastolic CHF, anxiety/depression/schizophrenia, tobacco abuse. 12/11, she presented to ED with sudden onset of dizziness, blurred vision, headache, nausea and vomiting. Blood pressure was significantly elevated to over 284 systolic with EMS. She was admitted to ICU for hemorrhagic stroke and bilateral lower extremity acute on chronic ischemia.Vascular surgery was consulted and s/p bilateral lower extremity femoral endarterectomy and patch angioplasty with iliac stenting, 4 compartment fasciotomies for acute on chronic lower extremity ischemia. Postop remained on vent and Cleviprex.Transferred to floor and TRH on 12/17. Hypoglycemic 08/30/2022 with blood sugar in the 50s, insulin adjusted Oral intake picking up patient did not want to have lumbar NG tube/feeding core track removed 1/10 Patient remains weak deconditioned unable to mobilize, needing wound care with [facility] change Monday Wednesday Friday Insurance has denied LTACH despite peer-to-peer review and approved for skilled nursing facility which family has agreed.  Subjective:  Seen and examined this morning.  Resting comfortably she has no complaint  She reports she will eat food strongly refuses tube placement  Assessment and Plan: Principal Problem:   ICH (intracerebral hemorrhage) (HCC) Active Problems:   Abscess of anal and rectal regions   Decubitus ulcer of sacral region, stage 2 (HCC)   Pulmonary edema  Rhabdomyolysis Left AKA and excisional debridement of bilateral groin wounds with VAC placement 08/15/23 Bilateral CFA endarterectomy with bovine patch angioplasty Bilateral iliofemoral embolectomy, stenting Bilateral CIA, stent Rt EIA and 4 compartment fasciotomies: Followed by vascular  surger..Patient with baseline Doppler flow right DP. S/P procedures as above.  Cont on wound VAC MWF on  bilateral groins and wound appears to be healing.  She remains weak ,deconditionined> continues to mobilize. Continue Plavix/statin. Continue Ancef and Flagyl x 6 wk from 08/14/22 for E. coli and Proteus [name] in wound culture I discussed w Dr Carlis Abbott 09/06/22-reviewed OR note and due to bovine patch as before Advised to keep total 6 weeks of antibiotics for possible graft infection> She had Rt IJ line in place since admission> we cahnged to lt arm midline, RT IJ out 1/23.  Acute [id number] CT scan showed acute intraparenchymal hematoma within the cerebellar vermis measuring 11 cc in volume and mild mass effect upon the fourth ventricle. Confirmed with an MRI.  Seen by neurology.  Eventually placed on antiplatelet agents tolerating Plavix 75.  Acute respiratory failure with hypoxia COPD/Smoker : Extubated 12/14> doing well on room air since at times on 2 L nasal cannula.Cont Brovana Pulmicort,Yupleri and albuterol as needed.   Chronic diastolic CHF HTN: BP well-controlled.Volume status stable.Echo EF 60 to 60% on 12/11 w/  indeterminate diastolic parameters. Continue Coreg, prn Hydralazine.  Monitor fluid status as below Net IO Since Admission: -536.56 mL [09/07/22 0932] Intake/Output Summary (Last 24 hours) at 09/07/2022 0932 Last data filed at 09/07/2022 0600 Gross per 24 hour  Intake --  Output 2150 ml  Net -2150 ml     Type 2 diabetes mellitus with uncontrolled hyperglycemia: A1c poorly controlled 12.9,Blood sugar remains controlled, continue long-acting insulin 5 units Semglee, Premeal insulin and SSI .She has a brittle DM.  Recent Labs  Lab 09/06/22 1631 09/06/22 2034 09/06/22 2327 09/07/22 0355 09/07/22 0758  GLUCAP 177* 173* 128* 136* 113*      Hx of CAD HLD AS s/p TAVR: Stable, no chest pain.Continue Plavix Lipitor.  Acute blood loss anemia in the setting of  surgery: s/p 3 units PRBC so far.Hemoglobin stable as below Recent Labs  Lab 09/02/22 0709 09/03/22 0054 09/04/22 0315 09/05/22 0644 09/06/22 0617  HGB 9.0* 8.9* 8.8* 11.9* 9.4*  HCT 28.4* 28.0* 28.8* 37.2 29.2*     Depression, bipolar, schizophrenia, insomnia: mood has been stable.  Continue on Lexapro daily, Remeron nightly, trazodone nightly.,    Dysphagia:Likely related to [place].core track has been removed.  Tolerating p.o. well augment diet, she does not want to have NG tube or tube feeding. Again she has had very poor oral intake, I discussed with her in detail she does not want any kind of feeding tube palliative care to discuss with family/daughters today.  She also states she is not ready for hospice yet. Family encouraged for oral nutrition  or else she will not improve and will not heal  Pressure injury stage II sacrum POA see below: Pressure Injury 08/04/22 Sacrum Stage 2 -  Partial thickness loss of dermis presenting as a shallow open injury with a red, pink wound bed without slough. (Active)  08/04/22 0100  Location: Sacrum  Location Orientation:   Staging: Stage 2 -  Partial thickness loss of dermis presenting as a shallow open injury with a red, pink wound bed without slough.  Wound Description (Comments):   Present on Admission: Yes  Dressing Type Foam - Lift dressing to assess site every shift 08/31/22 1950   DVT prophylaxis: heparin injection 5,000 Units Start: 08/10/22 1600 Code Status:   Code Status: Full Code Family Communication: plan of care discussed with patient at bedside. Discussed with palliative care team today.  Patient status is: inpatient  because of poor po intake Level of care: Telemetry Medical  Dispo: The patient is from: Home, w/ Husband.  Daughters are involved in care patient prefers her husband to make decision            Anticipated disposition: SNF pending. LTACH denied  Objective: Vitals last 24 hrs: Vitals:   09/06/22 2300 09/07/22  0353 09/07/22 0745 09/07/22 0757  BP: (!) 160/54 (!) 152/65  (!) 170/87  Pulse: 79 74  89  Resp: '14 12  14  '$ Temp: 98.8 F (37.1 C) 98.4 F (36.9 C)  98.9 F (37.2 C)  TempSrc: Oral Oral  Oral  SpO2: 92% 92% 94% 98%  Weight:  63 kg    Height:       Weight change: 0 kg  Physical Examination:  General exam: AA,weak, on RAM weak,older appearing HEENT:Oral mucosa moist, Ear/Nose WNL grossly, dentition normal. Respiratory system: bilaterally clear BS,no use of accessory muscle Cardiovascular system: S1 & S2 +, regular rate, JVD neg. Gastrointestinal system: Abdomen soft, NT,ND,BS+ Nervous System:Alert, awake, moving extremities and grossly nonfocal Extremities: Right leg incision site healed, left AKA staples intact , bilateral groin with wound VAC in place  Skin: No rashes,no icterus. MSK: weak muscle, low bulk,tone, power  General exam: AA, weak,older appearing Let arm midline +  Rt IJ dressing, line out.   Medications reviewed:  Scheduled Meds:  arformoterol  15 mcg Nebulization BID   atorvastatin  80 mg Per Tube Daily   budesonide (PULMICORT) nebulizer solution  0.5 mg Nebulization BID   carvedilol  12.5 mg Per Tube BID WC   Chlorhexidine Gluconate Cloth  6 each Topical Q0600   clopidogrel  75 mg Oral Daily   dronabinol  2.5 mg Oral BID AC   escitalopram  10 mg Per Tube Daily  feeding supplement  237 mL Oral TID WC   Gerhardt's butt cream   Topical BID   heparin injection (subcutaneous)  5,000 Units Subcutaneous Q8H   insulin aspart  0-15 Units Subcutaneous Q4H   insulin aspart  2 Units Subcutaneous TID WC   insulin glargine-yfgn  5 Units Subcutaneous Daily   metroNIDAZOLE  500 mg Per Tube Q12H   mirtazapine  7.5 mg Per Tube QHS   multivitamin with minerals  1 tablet Per Tube Daily   nicotine  14 mg Transdermal Daily   mouth rinse  15 mL Mouth Rinse Q4H   oxyCODONE  10 mg Oral Q12H   pantoprazole  40 mg Oral Daily   revefenacin  175 mcg Nebulization Daily    senna-docusate  1 tablet Oral QHS   sodium chloride flush  10-40 mL Intracatheter Q12H   sodium chloride flush  10-40 mL Intracatheter Q12H  Continuous Infusions:   ceFAZolin (ANCEF) IV 2 g (09/07/22 0221)   Diet Order             DIET DYS 3 Room service appropriate? Yes with Assist; Fluid consistency: Thin  Diet effective now                  Intake/Output Summary (Last 24 hours) at 09/07/2022 0932 Last data filed at 09/07/2022 0600 Gross per 24 hour  Intake --  Output 2150 ml  Net -2150 ml    Net IO Since Admission: -536.56 mL [09/07/22 0932]  Wt Readings from Last 3 Encounters:  09/07/22 63 kg  08/03/22 58.7 kg  07/01/22 54.4 kg     Unresulted Labs (From admission, onward)    None     Data Reviewed: I have personally reviewed following labs and imaging studies CBC: Recent Labs  Lab 09/02/22 0709 09/03/22 0054 09/04/22 0315 09/05/22 0644 09/06/22 0617  WBC 6.5 7.2 8.0 5.1 7.5  HGB 9.0* 8.9* 8.8* 11.9* 9.4*  HCT 28.4* 28.0* 28.8* 37.2 29.2*  MCV 93.4 91.8 95.7 91.6 92.1  PLT 454* 447* 443* 325 413*    Basic Metabolic Panel: Recent Labs  Lab 09/02/22 0709 09/03/22 0054 09/04/22 0315 09/05/22 0644 09/06/22 0617  NA 136 137 138 138 138  K 3.7 3.5 3.5 3.8 3.5  CL 98 102 100 101 101  CO2 '29 30 29 26 26  '$ GLUCOSE 146* 119* 74 120* 129*  BUN '12 11 11 9 9  '$ CREATININE 0.67 0.78 0.73 0.69 0.76  CALCIUM 8.9 8.7* 8.6* 8.4* 8.4*   FUX:NATFTDDUK Creatinine Clearance: 55 mL/min (by C-G formula based on SCr of 0.76 mg/dL). Antimicrobials: Anti-infectives (From admission, onward)    Start     Dose/Rate Route Frequency Ordered Stop   08/20/22 2200  metroNIDAZOLE (FLAGYL) IVPB 500 mg  Status:  Discontinued        500 mg 100 mL/hr over 60 Minutes Intravenous Every 12 hours 08/20/22 1110 08/20/22 1111   08/20/22 2200  metroNIDAZOLE (FLAGYL) tablet 500 mg        500 mg Per Tube Every 12 hours 08/20/22 1111     08/18/22 2300  ceFAZolin (ANCEF) IVPB 2g/100 mL  premix        2 g 200 mL/hr over 30 Minutes Intravenous Every 8 hours 08/18/22 1455     08/18/22 2300  metroNIDAZOLE (FLAGYL) IVPB 500 mg  Status:  Discontinued        500 mg 100 mL/hr over 60 Minutes Intravenous Every 6 hours 08/18/22 1455 08/20/22 1110   08/15/22  1100  vancomycin (VANCOCIN) IVPB 1000 mg/200 mL premix  Status:  Discontinued        1,000 mg 200 mL/hr over 60 Minutes Intravenous Every 24 hours 08/14/22 1006 08/15/22 1307   08/14/22 1400  piperacillin-tazobactam (ZOSYN) IVPB 3.375 g  Status:  Discontinued        3.375 g 12.5 mL/hr over 240 Minutes Intravenous Every 8 hours 08/14/22 1006 08/18/22 1455   08/14/22 1100  vancomycin (VANCOREADY) IVPB 1500 mg/300 mL        1,500 mg 150 mL/hr over 120 Minutes Intravenous  Once 08/14/22 1006 08/14/22 1502   08/04/22 1045  ceFAZolin (ANCEF) IVPB 2g/100 mL premix        2 g 200 mL/hr over 30 Minutes Intravenous On call to O.R. 08/04/22 0958 08/04/22 1100     Culture/Microbiology    Component Value Date/Time   SDES WOUND 08/14/2022 0836   SPECREQUEST GROIN 08/14/2022 0836   CULT  08/14/2022 0836    MODERATE ESCHERICHIA COLI RARE PROTEUS MIRABILIS MODERATE BACTEROIDES OVATUS MODERATE BACTEROIDES STERCORIS BETA LACTAMASE POSITIVE Performed at [facility], [place] [address], [place], [place] [address]    REPTSTATUS 08/17/2022 FINAL 08/14/2022 8938    Radiology Studies: No results found.   LOS: 54 days   Antonieta Pert, MD [facility]  09/07/2022, 9:32 AM

## 2022-09-07 NOTE — Progress Notes (Signed)
NUTRITION NOTE RD working remotely.  Patient last assessed by a RD in person on 09/05/22. Consult received for Calorie Count to aid in discussion about PEG. Communicated with RN via secure chat concerning this. RD will follow-up on 1/15 and 1/16 with results.  Palliative Care last met with patient on 1/13. At that time patient reported ongoing poor appetite so 2.5 mg marinol BID was started yesterday evening.   Patient has accepted all bottles of Ensure since 1/11.   Heather Matin, MS, RD, LDN, CNSC Clinical Dietitian PRN/Relief staff On-call/weekend pager # available in [facility]

## 2022-09-07 NOTE — TOC Progression Note (Signed)
Transition of Care [facility]) - Progression Note    Patient Details  Name: Heather [name] MRN: [id number] Date of Birth: [birth year]  Transition of [facility] CM/SW [place], [place] Phone Number: 09/07/2022, 3:44 PM  Clinical Narrative:     No current SNF bed offers. TOC will continue to follow and assist with patients dc planning needs.  Expected Discharge Plan: Skilled Nursing Facility Barriers to Discharge: [place] (PASRR), Insurance Authorization, Continued Medical Work up, SNF Pending bed offer (cortrak)  Expected Discharge Plan and Services In-house Referral: Clinical Social Work   Post Acute Care Choice: Long Term Acute Care (LTAC) Living arrangements for the past 2 months: [place] Determinants of Health (SDOH) Interventions [place]: Food Insecurity Present (08/11/2022)  Housing: Low Risk  (08/11/2022)  Transportation Needs: No Transportation Needs (08/11/2022)  Utilities: Not At Risk (08/11/2022)  Financial Resource Strain: Medium Risk (10/19/2018)  Physical Activity: Inactive (10/19/2018)  Social Connections: Unknown (10/19/2018)  Tobacco Use: High Risk (08/15/2022)    Readmission Risk Interventions    11/13/2020    2:27 PM  Readmission Risk Prevention Plan  Transportation Screening Complete  PCP or Specialist Appt within 3-5 Days Complete  HRI or [place] Complete  Social Work Consult for [place] Planning/Counseling Complete  Palliative Care Screening Not Applicable  Medication Review Press photographer) Complete

## 2022-09-08 DIAGNOSIS — I614 Nontraumatic intracerebral hemorrhage in cerebellum: Secondary | ICD-10-CM | POA: Diagnosis not present

## 2022-09-08 DIAGNOSIS — J810 Acute pulmonary edema: Secondary | ICD-10-CM | POA: Diagnosis not present

## 2022-09-08 DIAGNOSIS — L89152 Pressure ulcer of sacral region, stage 2: Secondary | ICD-10-CM | POA: Diagnosis not present

## 2022-09-08 DIAGNOSIS — K612 Anorectal abscess: Secondary | ICD-10-CM | POA: Diagnosis not present

## 2022-09-08 LAB — GLUCOSE, CAPILLARY
Glucose-Capillary: 108 mg/dL — ABNORMAL HIGH (ref 70–99)
Glucose-Capillary: 109 mg/dL — ABNORMAL HIGH (ref 70–99)
Glucose-Capillary: 111 mg/dL — ABNORMAL HIGH (ref 70–99)
Glucose-Capillary: 121 mg/dL — ABNORMAL HIGH (ref 70–99)
Glucose-Capillary: 360 mg/dL — ABNORMAL HIGH (ref 70–99)
Glucose-Capillary: 42 mg/dL — CL (ref 70–99)

## 2022-09-08 LAB — BASIC METABOLIC PANEL
Anion gap: 10 (ref 5–15)
BUN: 9 mg/dL (ref 8–23)
CO2: 25 mmol/L (ref 22–32)
Calcium: 8.2 mg/dL — ABNORMAL LOW (ref 8.9–10.3)
Chloride: 100 mmol/L (ref 98–111)
Creatinine, Ser: 0.81 mg/dL (ref 0.44–1.00)
GFR, Estimated: >60 mL/min (ref >60)
Glucose, Bld: 91 mg/dL (ref 70–99)
Potassium: 3.5 mmol/L (ref 3.5–5.1)
Sodium: 135 mmol/L (ref 135–145)

## 2022-09-08 LAB — CBC
HCT: 30.6 % — ABNORMAL LOW (ref 36.0–46.0)
Hemoglobin: 9.8 g/dL — ABNORMAL LOW (ref 12.0–15.0)
MCH: 29.3 pg (ref 26.0–34.0)
MCHC: 32 g/dL (ref 30.0–36.0)
MCV: 91.6 fL (ref 80.0–100.0)
Platelets: 377 10*3/uL (ref 150–400)
RBC: 3.34 MIL/uL — ABNORMAL LOW (ref 3.87–5.11)
RDW: 15.6 % — ABNORMAL HIGH (ref 11.5–15.5)
WBC: 5.5 10*3/uL (ref 4.0–10.5)
nRBC: 0 % (ref 0.0–0.2)

## 2022-09-08 MED ORDER — DEXTROSE 50 % IV SOLN
INTRAVENOUS | Status: AC
  Administered 2022-09-08: 50 mL
  Filled 2022-09-08: qty 50

## 2022-09-08 NOTE — Progress Notes (Signed)
PROGRESS NOTE Heather [name]  TMH:[id number] DOB: [birth year] DOA: 08/04/2022 PCP: [name], FNP   Brief Narrative/Hospital Course: 71 y.o. female with PMH significant for DM2, neuropathy, HTN, HLD, COPD, GERD, PAD, AS s/p TAVR, chronic diastolic CHF, anxiety/depression/schizophrenia, tobacco abuse who presented to the emergency room on 92/11 with systolic blood pressure in the 200s and symptomatology including dizziness, headache and blurred vision.  Patient was admitted to ICU for hemorrhagic stroke and bilateral lower extremity acute on chronic ischemia.  Vascular surgery was consulted and s/p bilateral lower extremity femoral endarterectomy and patch angioplasty with iliac stenting, 4 compartment fasciotomies for acute on chronic lower extremity ischemia. Postop remained on vent and Cleviprex.eventually, able to be extubated and weaned off pressor support and transferred to hospitalist service on 12/17.  Eventually able to be weaned off of transferred to floor and [place] on 12/17.  Hospital course since then noteworthy for episodes of hypoglycemia with insulin adjustments.  Initially required tube feedings but as oral intake improved, able to be discontinued.  Patient remains weak deconditioned unable to mobilize, needing wound care with [facility] change Monday Wednesday Friday.  Insurance has denied LTACH despite peer-to-peer review and approved for skilled nursing facility which family has agreed.  Subjective: Patient with no complaints.  Assessment and Plan: Principal Problem:   ICH (intracerebral hemorrhage) (HCC) Active Problems:   Abscess of anal and rectal regions   Decubitus ulcer of sacral region, stage 2 (HCC)   Pulmonary edema  Rhabdomyolysis Left AKA and excisional debridement of bilateral groin wounds with VAC placement 08/15/23 Bilateral CFA endarterectomy with bovine patch angioplasty Bilateral iliofemoral embolectomy, stenting Bilateral CIA, stent Rt EIA and 4 compartment  fasciotomies: Followed by vascular surger..Patient with baseline Doppler flow right DP. S/P procedures as above.  Cont on wound VAC MWF on  bilateral groins and wound appears to be healing.  She remains weak ,deconditionined> continues to mobilize. Continue Plavix/statin. Continue Ancef and Flagyl x 6 wk from 08/14/22 for E. coli and Proteus [name] in wound culture In discussion with surgery, plan is to keep antibiotics for 6 weeks total to treat any potential graft infection.   Acute HER:DEYCXKG CT scan showed acute intraparenchymal hematoma within the cerebellar vermis measuring 11 cc in volume and mild mass effect upon the fourth ventricle. Confirmed with an MRI.  Seen by neurology.  Eventually placed on antiplatelet agents tolerating Plavix 75.  Acute respiratory failure with hypoxia COPD/Smoker : Extubated 12/14> doing well on room air since at times on 2 L nasal cannula.Cont Brovana Pulmicort,Yupleri and albuterol as needed.   Chronic diastolic CHF HTN: BP well-controlled.Volume status stable.Echo EF 60 to 60% on 12/11 w/  indeterminate diastolic parameters.  To date, patient has diuresed almost 7 L. Continue Coreg, prn Hydralazine.  Monitor fluid status as below Net IO Since Admission: -1,341.56 mL [09/08/22 1430] Intake/Output Summary (Last 24 hours) at 09/08/2022 1430 Last data filed at 09/08/2022 0940 Gross per 24 hour  Intake 120 ml  Output 925 ml  Net -805 ml     Type 2 diabetes mellitus with uncontrolled hyperglycemia: A1c poorly controlled 12.9,Blood sugar remains controlled, continue long-acting insulin 5 units Semglee, Premeal insulin and SSI .She has a brittle DM.  Recent Labs  Lab 09/07/22 2020 09/07/22 2345 09/08/22 0402 09/08/22 0815 09/08/22 1143  GLUCAP 115* 97 111* 109* 121*      Hx of CAD HLD AS s/p TAVR: Stable, no chest pain.Continue Plavix Lipitor.     Acute blood loss anemia in the setting of  surgery: s/p 3 units PRBC so far.Hemoglobin stable  as below Recent Labs  Lab 09/03/22 0054 09/04/22 0315 09/05/22 0644 09/06/22 0617 09/08/22 0058  HGB 8.9* 8.8* 11.9* 9.4* 9.8*  HCT 28.0* 28.8* 37.2 29.2* 30.6*     Depression, bipolar, schizophrenia, insomnia: mood has been stable.  Continue on Lexapro daily, Remeron nightly, trazodone nightly.,    Dysphagia:Likely related to [place].core track has been removed.  Tolerating p.o. well augment diet, she does not want to have NG tube or tube feeding. Again she has had very poor oral intake, I discussed with her in detail she does not want any kind of feeding tube palliative care to discuss with family/daughters today.  She also states she is not ready for hospice yet. Family encouraged for oral nutrition  or else she will not improve and will not heal  Pressure injury stage II sacrum POA see below: Pressure Injury 08/04/22 Sacrum Stage 2 -  Partial thickness loss of dermis presenting as a shallow open injury with a red, pink wound bed without slough. (Active)  08/04/22 0100  Location: Sacrum  Location Orientation:   Staging: Stage 2 -  Partial thickness loss of dermis presenting as a shallow open injury with a red, pink wound bed without slough.  Wound Description (Comments):   Present on Admission: Yes  Dressing Type Foam - Lift dressing to assess site every shift 08/31/22 1950   DVT prophylaxis: heparin injection 5,000 Units Start: 08/10/22 1600 Code Status:   Code Status: Full Code Family Communication: plan of care discussed with patient at bedside. Discussed with palliative care team today.  Patient status is: inpatient  because of poor po intake Level of care: Telemetry Medical  Dispo: The patient is from: Home, w/ Husband.  Daughters are involved in care patient prefers her husband to make decision            Anticipated disposition: [facility]ochemist, clinical for skilled nursing SNF pending. LTACH denied  Objective: Vitals last 24 hrs: Vitals:   09/08/22 0400 09/08/22 0812 09/08/22  0815 09/08/22 1133  BP: 130/66 (!) 179/59 (!) 179/59 (!) 122/44  Pulse: 74  68 70  Resp: '12  16 14  '$ Temp: 99.8 F (37.7 C) 98.5 F (36.9 C)  97.6 F (36.4 C)  TempSrc: Oral Oral  Oral  SpO2: 92%  100% 91%  Weight: 57.6 kg     Height:       Weight change: -5.443 kg  Physical Examination:  General exam: Alert and oriented x 2 no acute distress HEENT: Normocephalic, atraumatic, mucous membranes are moist Respiratory system: Clear to auscultation bilaterally Cardiovascular system: Regular rate and rhythm, S1-S2 Gastrointestinal system: Soft nontender, nondistended, positive bowel sounds Nervous System:Alert, awake, moving extremities and grossly nonfocal Extremities: Right leg incision site healed, left AKA staples intact , bilateral groin with wound VAC in place  Skin: No skin breaks, tears or lesions other than described above MSK: weak muscle, low bulk,tone, power     Medications reviewed:  Scheduled Meds:  arformoterol  15 mcg Nebulization BID   atorvastatin  80 mg Per Tube Daily   budesonide (PULMICORT) nebulizer solution  0.5 mg Nebulization BID   carvedilol  12.5 mg Per Tube BID WC   Chlorhexidine Gluconate Cloth  6 each Topical Q0600   clopidogrel  75 mg Oral Daily   dronabinol  2.5 mg Oral BID AC   escitalopram  10 mg Per Tube Daily   feeding supplement  237 mL Oral TID WC  Gerhardt's butt cream   Topical BID   heparin injection (subcutaneous)  5,000 Units Subcutaneous Q8H   insulin aspart  0-15 Units Subcutaneous Q4H   insulin aspart  2 Units Subcutaneous TID WC   insulin glargine-yfgn  5 Units Subcutaneous Daily   metroNIDAZOLE  500 mg Per Tube Q12H   mirtazapine  7.5 mg Per Tube QHS   multivitamin with minerals  1 tablet Per Tube Daily   nicotine  14 mg Transdermal Daily   mouth rinse  15 mL Mouth Rinse Q4H   oxyCODONE  10 mg Oral Q12H   pantoprazole  40 mg Oral Daily   revefenacin  175 mcg Nebulization Daily   senna-docusate  1 tablet Oral QHS   sodium  chloride flush  10-40 mL Intracatheter Q12H   sodium chloride flush  10-40 mL Intracatheter Q12H  Continuous Infusions:   ceFAZolin (ANCEF) IV 2 g (09/08/22 0905)   Diet Order             DIET DYS 3 Room service appropriate? Yes with Assist; Fluid consistency: Thin  Diet effective now                  Intake/Output Summary (Last 24 hours) at 09/08/2022 1430 Last data filed at 09/08/2022 0940 Gross per 24 hour  Intake 120 ml  Output 925 ml  Net -805 ml    Net IO Since Admission: -1,341.56 mL [09/08/22 1430]  Wt Readings from Last 3 Encounters:  09/08/22 57.6 kg  08/03/22 58.7 kg  07/01/22 54.4 kg     Unresulted Labs (From admission, onward)    None     Data Reviewed:  Hemoglobin improving at 9.8, basic metabolic panel normal  Antimicrobials: Anti-infectives (From admission, onward)    Start     Dose/Rate Route Frequency Ordered Stop   08/20/22 2200  metroNIDAZOLE (FLAGYL) IVPB 500 mg  Status:  Discontinued        500 mg 100 mL/hr over 60 Minutes Intravenous Every 12 hours 08/20/22 1110 08/20/22 1111   08/20/22 2200  metroNIDAZOLE (FLAGYL) tablet 500 mg        500 mg Per Tube Every 12 hours 08/20/22 1111     08/18/22 2300  ceFAZolin (ANCEF) IVPB 2g/100 mL premix        2 g 200 mL/hr over 30 Minutes Intravenous Every 8 hours 08/18/22 1455     08/18/22 2300  metroNIDAZOLE (FLAGYL) IVPB 500 mg  Status:  Discontinued        500 mg 100 mL/hr over 60 Minutes Intravenous Every 6 hours 08/18/22 1455 08/20/22 1110   08/15/22 1100  vancomycin (VANCOCIN) IVPB 1000 mg/200 mL premix  Status:  Discontinued        1,000 mg 200 mL/hr over 60 Minutes Intravenous Every 24 hours 08/14/22 1006 08/15/22 1307   08/14/22 1400  piperacillin-tazobactam (ZOSYN) IVPB 3.375 g  Status:  Discontinued        3.375 g 12.5 mL/hr over 240 Minutes Intravenous Every 8 hours 08/14/22 1006 08/18/22 1455   08/14/22 1100  vancomycin (VANCOREADY) IVPB 1500 mg/300 mL        1,500 mg 150 mL/hr over  120 Minutes Intravenous  Once 08/14/22 1006 08/14/22 1502   08/04/22 1045  ceFAZolin (ANCEF) IVPB 2g/100 mL premix        2 g 200 mL/hr over 30 Minutes Intravenous On call to O.R. 08/04/22 0958 08/04/22 1100     Culture/Microbiology    Component Value Date/Time  SDES WOUND 08/14/2022 0836   SPECREQUEST GROIN 08/14/2022 0836   CULT  08/14/2022 0836    MODERATE ESCHERICHIA COLI RARE PROTEUS MIRABILIS MODERATE BACTEROIDES OVATUS MODERATE BACTEROIDES STERCORIS BETA LACTAMASE POSITIVE Performed at [facility], [place] [address] [place], [place] [address]    REPTSTATUS 08/17/2022 FINAL 08/14/2022 6431    Radiology Studies: No results found.   LOS: 35 days   Annita Brod, MD [facility]  09/08/2022, 2:30 PM

## 2022-09-08 NOTE — TOC Progression Note (Signed)
Transition of Care [facility]) - Progression Note    Patient Details  Name: Heather [name] MRN: [id number] Date of Birth: Jan 12, 1951  Transition of [facility]) CM/SW [place], [place] Phone Number: 09/08/2022, 6:33 PM  Clinical Narrative:     Patient has no bed offers at this time. CSW sent out more referrals in the area that is in network w/ [employer].   TOC will continue to follow and assist with d/c planning.  Thurmond Butts, MSW, LCSW Clinical Social Worker    Expected Discharge Plan: Skilled Nursing Facility Barriers to Discharge: [place] (PASRR), Insurance Authorization, Continued Medical Work up, SNF Pending bed offer (cortrak)  Expected Discharge Plan and Services In-house Referral: Clinical Social Work   Post Acute Care Choice: Long Term Acute Care (LTAC) Living arrangements for the past 2 months: [place] Determinants of Health (SDOH) Interventions [place]: Food Insecurity Present (08/11/2022)  Housing: Low Risk  (08/11/2022)  Transportation Needs: No Transportation Needs (08/11/2022)  Utilities: Not At Risk (08/11/2022)  Financial Resource Strain: Medium Risk (10/19/2018)  Physical Activity: Inactive (10/19/2018)  Social Connections: Unknown (10/19/2018)  Tobacco Use: High Risk (08/15/2022)    Readmission Risk Interventions    11/13/2020    2:27 PM  Readmission Risk Prevention Plan  Transportation Screening Complete  PCP or Specialist Appt within 3-5 Days Complete  HRI or [place] Complete  Social Work Consult for [place] Planning/Counseling Complete  Palliative Care Screening Not Applicable  Medication Review Press photographer) Complete

## 2022-09-08 NOTE — Progress Notes (Signed)
Nutrition Brief Note  RD returned to pt room to follow-up on calorie count started yesterday, 09/07/22. No envelope on door, NT said she was unaware of order due to no envelope on door. RD hung new envelope on door and will follow-up tomorrow.   Hermina Barters RD, LDN Clinical Dietitian See Shea Evans for contact information.

## 2022-09-08 NOTE — Consult Note (Signed)
[place] Nurse wound follow up Wound type:bilateral groin wounds with NPWT.  Y connecting 2 dressings together. Attempted to bridge wounds together  Measurement:left inguinal wound:  5.5 cm x 5 cm x 3 cm  Right inguinal wound: 7.4 x 4 cm x 3 cm  Wound bed: beefy red, some fibrin to wound bed Drainage (amount, consistency, odor) heavy lymphatic drainage.  Changed canister with 450 Ml inside.  Periwound: intact  wound in inguinal fold, barrier ring to periwound to promote seal Dressing procedure/placement/frequency: 1 piece white foam, 1 piece black foam removed from each wound. WOund cleansed with NS and replaced one piece white foam and one piece black foam. Covered with drape and trac pad applied with y connector. Seal achieved at 125 MM HG.  Change Mon.Wed.Fri.  Patient is complaining of wound pain and right foot pain.  Nurse to provide analgesia .  Will follow.  Estrellita Ludwig MSN, RN, FNP-BC CWON Wound, Ostomy, Continence Nurse [facility] [phone] Pager [phone]

## 2022-09-08 NOTE — Progress Notes (Signed)
Physical Therapy Treatment Patient Details Name: Heather [name] MRN: [id number] DOB: [birth year] Today's Date: 09/08/2022   History of Present Illness Pt is a 72 y.o. female admitted 08/04/22 with headache, dizziness. 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  Found to have mottled LE's after given Narcdipine due to HTN; s/p emergent common femoral and iliofemoral endarterectomy and bilateral 4 compartment fasciotomies on 12/11. 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 PMH includes smoker(2ppd), severe AI/AS s/p TAVR, CAD s/p PCI and on DAPT, COPD, PAD, HTN.    PT Comments    Pt continues to make slow progress. Focused on strengthening ex's today to work toward improving mobility. Continue to recommend SNF.    Recommendations for follow up therapy are one component of a multi-disciplinary discharge planning process, led by the attending physician.  Recommendations may be updated based on patient status, additional functional criteria and insurance authorization.  Follow Up Recommendations  Skilled nursing-short term rehab (<3 hours/day) Can patient physically be transported by private vehicle: No   Assistance Recommended at Discharge Frequent or constant Supervision/Assistance  Patient can return home with the following Two people to help with walking and/or transfers;Two people to help with bathing/dressing/bathroom;Help with stairs or ramp for entrance;Assist for transportation;Assistance with cooking/housework   Equipment Recommendations  Other (comment) (defer to next venue)    Recommendations for Other Services       Precautions / Restrictions Precautions Precautions: Fall;Other (comment) Precaution Comments: bilateral groin wound vac; multiple buttocks wounds     Mobility  Bed Mobility                    Transfers                        Ambulation/Gait                   Stairs              Wheelchair Mobility    Modified Rankin (Stroke Patients Only)       Balance                                            Cognition Arousal/Alertness: Awake/alert Behavior During Therapy: Flat affect Overall Cognitive Status: No family/caregiver present to determine baseline cognitive functioning                         Following Commands: Follows one step commands consistently Safety/Judgement: Decreased awareness of deficits   Problem Solving: Requires verbal cues, Slow processing          Exercises General Exercises - Lower Extremity Ankle Circles/Pumps: AROM, Right, 10 reps, Supine Heel Slides: AAROM, 10 reps, Right, Supine Hip ABduction/ADduction: AAROM, Right, 10 reps, Supine Amputee Exercises Hip ABduction/ADduction: AROM, Left, 10 reps, Supine Straight Leg Raises: AAROM, Left, 10 reps, Supine Other Exercises Other Exercises: Pulling trunk forward from elevated HOB toward long sitting x 6    General Comments        Pertinent Vitals/Pain Pain Assessment Pain Assessment: No/denies pain    Home Living                          Prior Function  PT Goals (current goals can now be found in the care plan section) Acute Rehab PT Goals Patient Stated Goal: to get better Progress towards PT goals: Progressing toward goals    Frequency    Min 2X/week      PT Plan Current plan remains appropriate    Co-evaluation              AM-PAC PT "6 Clicks" Mobility   Outcome Measure  Help needed turning from your back to your side while in a flat bed without using bedrails?: A Lot Help needed moving from lying on your back to sitting on the side of a flat bed without using bedrails?: A Lot Help needed moving to and from a bed to a chair (including a wheelchair)?: Total Help needed standing up from a chair using your arms (e.g., wheelchair or bedside chair)?: Total Help needed to walk in hospital room?:  Total Help needed climbing 3-5 steps with a railing? : Total 6 Click Score: 8    End of Session   Activity Tolerance: Patient tolerated treatment well Patient left: in bed;with call bell/phone within reach   PT Visit Diagnosis: Muscle weakness (generalized) (M62.81);Other abnormalities of gait and mobility (R26.89)     Time: 0768-0881 PT Time Calculation (min) (ACUTE ONLY): 17 min  Charges:  $Therapeutic Exercise: 8-22 mins                     [place] [facility] [place] 09/08/2022, 5:37 PM

## 2022-09-09 DIAGNOSIS — S31109A Unspecified open wound of abdominal wall, unspecified quadrant without penetration into peritoneal cavity, initial encounter: Secondary | ICD-10-CM | POA: Diagnosis not present

## 2022-09-09 DIAGNOSIS — I614 Nontraumatic intracerebral hemorrhage in cerebellum: Secondary | ICD-10-CM | POA: Diagnosis not present

## 2022-09-09 LAB — GLUCOSE, CAPILLARY
Glucose-Capillary: 175 mg/dL — ABNORMAL HIGH (ref 70–99)
Glucose-Capillary: 217 mg/dL — ABNORMAL HIGH (ref 70–99)
Glucose-Capillary: 224 mg/dL — ABNORMAL HIGH (ref 70–99)
Glucose-Capillary: 228 mg/dL — ABNORMAL HIGH (ref 70–99)
Glucose-Capillary: 274 mg/dL — ABNORMAL HIGH (ref 70–99)
Glucose-Capillary: 309 mg/dL — ABNORMAL HIGH (ref 70–99)

## 2022-09-09 MED ORDER — ATORVASTATIN CALCIUM 80 MG PO TABS
80.0000 mg | ORAL_TABLET | Freq: Every day | ORAL | Status: DC
  Administered 2022-09-09 – 2022-09-18 (×10): 80 mg via ORAL
  Filled 2022-09-09 (×10): qty 1

## 2022-09-09 MED ORDER — ADULT MULTIVITAMIN W/MINERALS CH
1.0000 | ORAL_TABLET | Freq: Every day | ORAL | Status: DC
  Administered 2022-09-09 – 2022-09-20 (×12): 1 via ORAL
  Filled 2022-09-09 (×13): qty 1

## 2022-09-09 MED ORDER — METRONIDAZOLE 500 MG PO TABS
500.0000 mg | ORAL_TABLET | Freq: Two times a day (BID) | ORAL | Status: DC
  Administered 2022-09-09 – 2022-09-22 (×27): 500 mg via ORAL
  Filled 2022-09-09 (×27): qty 1

## 2022-09-09 MED ORDER — INSULIN GLARGINE-YFGN 100 UNIT/ML ~~LOC~~ SOLN
3.0000 [IU] | Freq: Every day | SUBCUTANEOUS | Status: DC
  Administered 2022-09-09: 3 [IU] via SUBCUTANEOUS
  Filled 2022-09-09 (×2): qty 0.03

## 2022-09-09 MED ORDER — BISACODYL 10 MG RE SUPP
10.0000 mg | Freq: Once | RECTAL | Status: AC
  Administered 2022-09-09: 10 mg via RECTAL
  Filled 2022-09-09: qty 1

## 2022-09-09 MED ORDER — ESCITALOPRAM OXALATE 10 MG PO TABS
10.0000 mg | ORAL_TABLET | Freq: Every day | ORAL | Status: DC
  Administered 2022-09-09 – 2022-09-22 (×14): 10 mg via ORAL
  Filled 2022-09-09 (×14): qty 1

## 2022-09-09 MED ORDER — MIRTAZAPINE 15 MG PO TABS
7.5000 mg | ORAL_TABLET | Freq: Every day | ORAL | Status: DC
  Administered 2022-09-09 – 2022-09-21 (×13): 7.5 mg via ORAL
  Filled 2022-09-09 (×13): qty 1

## 2022-09-09 MED ORDER — INSULIN ASPART 100 UNIT/ML IJ SOLN
0.0000 [IU] | Freq: Three times a day (TID) | INTRAMUSCULAR | Status: DC
  Administered 2022-09-09: 2 [IU] via SUBCUTANEOUS
  Administered 2022-09-09: 4 [IU] via SUBCUTANEOUS
  Administered 2022-09-10 – 2022-09-11 (×3): 1 [IU] via SUBCUTANEOUS
  Administered 2022-09-11: 2 [IU] via SUBCUTANEOUS
  Administered 2022-09-12: 1 [IU] via SUBCUTANEOUS
  Administered 2022-09-12 – 2022-09-13 (×2): 2 [IU] via SUBCUTANEOUS
  Administered 2022-09-13 – 2022-09-15 (×4): 1 [IU] via SUBCUTANEOUS
  Administered 2022-09-16: 2 [IU] via SUBCUTANEOUS
  Administered 2022-09-16: 3 [IU] via SUBCUTANEOUS
  Administered 2022-09-16: 1 [IU] via SUBCUTANEOUS
  Administered 2022-09-17: 4 [IU] via SUBCUTANEOUS
  Administered 2022-09-17: 2 [IU] via SUBCUTANEOUS
  Administered 2022-09-17: 4 [IU] via SUBCUTANEOUS
  Administered 2022-09-18 (×2): 2 [IU] via SUBCUTANEOUS
  Administered 2022-09-19: 3 [IU] via SUBCUTANEOUS
  Administered 2022-09-19 – 2022-09-20 (×2): 1 [IU] via SUBCUTANEOUS
  Administered 2022-09-20 – 2022-09-21 (×3): 3 [IU] via SUBCUTANEOUS
  Administered 2022-09-21: 1 [IU] via SUBCUTANEOUS
  Administered 2022-09-22: 2 [IU] via SUBCUTANEOUS
  Administered 2022-09-22: 1 [IU] via SUBCUTANEOUS

## 2022-09-09 MED ORDER — CARVEDILOL 12.5 MG PO TABS
12.5000 mg | ORAL_TABLET | Freq: Two times a day (BID) | ORAL | Status: DC
  Administered 2022-09-09 – 2022-09-15 (×14): 12.5 mg via ORAL
  Filled 2022-09-09 (×14): qty 1

## 2022-09-09 NOTE — Progress Notes (Signed)
Palliative care chart check follow up.  CSW searching patient insurance network for bed placement.  Progressing towards discharge, no acute issues other than placement.  Please call with any needs.  Kizzie Fantasia, MSN, RN-BC, [facility], [facility] Palliative Clinical Specialist [facility]

## 2022-09-09 NOTE — Consult Note (Addendum)
[facility]                                                                                        Patient Identification: Patient Name: Heather [name] MRN: [id number] [place] Date: 08/04/2022 12:58 AM Today's Date: 09/09/2022 Reason for consult:  Requesting provider:   Principal Problem:   ICH (intracerebral hemorrhage) ([place]) Active Problems:   Abscess of anal and rectal regions   Decubitus ulcer of sacral region, stage 2 ([place])   Pulmonary edema   Antibiotics:  Vancomycin 12/25-12/26 Cefazolin 12/25-c Metronidazole 12/27-c  Lines/Hardware: Left arm midline +, Bilateral wound vac+  Assessment 72 year old female with PMH as below including Aortic stenosis status post TAVR in 2018 who initially presented to the ED on 12//11 with hypertension including dizziness, headache and blurry vision. Hospital course complicated by hemorrhagic CVA, acute on chronic LE ischemic requiring vascular interventions on 12/11 and 12/21. ID consulted for abtx recommendations given wound cx ( Rt groin) from OR 12/21 grew Proteus mirabilis, E coli as well as Bacteroides ovatus and Bacteroides stercoris. Vascular surgery recommending IV abtx for 6 weeks due to concerns for exposed patch in the rt groin and  concerns for infection of the covered stents in the bilateral common iliac arteries (VBX) which is equivalent to a graft. D/w Dr Scot Dock.   Recommendations  Continue cefazolin and metronidazole as is Complete 6 weeks of above abtx from 12/21. EOT 09/25/22. This will need to followed by PO suppression for at least 6 months if not longer  Monitor CBC and BMP Will follow in the clinic, otherwise ID will SO D/w primary as well as Vascular surgery   Diagnosis: Groin wound infection with concerns of VBX stent infection + patch angioplasty  Culture Result: Polymicrobial   Allergies  Allergen Reactions   Iodinated  Contrast Media Hives   Sulfa Antibiotics Hives   Shellfish Allergy Nausea And Vomiting, Rash and Other (See Comments)    Scallops specifically cause VOMITING      OPAT Orders Discharge antibiotics to be given via PICC line Discharge antibiotics: cefazolin 2g IV q8 hrs and metronidazole 500 mg po bid Per pharmacy protocol  Duration: 6 weeks to be followed by po augmentin for suppression  PIC Care Per Protocol:  Home health RN for IV administration and teaching; PICC line care and labs.    Labs weekly while on IV antibiotics: X__ CBC with differential X__ BMP __ CMP X__ CRP X__ ESR __ Vancomycin trough __ CK  X__ Please pull PIC at completion of IV antibiotics __ Please leave PIC in place until doctor has seen patient or been notified  Fax weekly labs to [phone]  Clinic Follow Up Appt: 09/24/22 with Dr West Bali  Rest of the management as per the primary team. Please call with questions or concerns.  Thank you for the consult  Rosiland Oz, MD Infectious Disease Physician [facility] [address] [place], [place] [address] Phone: [phone]  Fax: [phone]  __________________________________________________________________________________________________________ HPI and Hospital Course: 72 year old female with PMH as below.  Aortic stenosis status post TAVR in  2018 who initially presented to the ED on 12//11 with hypertension including dizziness, headache and blurry vision.  She was initially admitted in the ICU for management of hemorrhagic CVA and bilateral lower extremity acute on chronic ischemia.  Evaluated by vascular surgery and underwent bilateral lower extremity femoral endarterectomy and patch angioplasty with iliac stenting, fourth compartment fasciotomies for acute on chronic lower extremity ischemia.  Postoperatively, patient remained on ventilation and Cleviprex, eventually got extubated  and weaned off of pressor support and transferred out of ICU on 12/17.  Further Hospital course notable for episodes of hypoglycemia  Procedures done  12/11Bilateral common femoral endarterectomy with bovine pericardial patch angioplasty. Bilateral iliofemoral embolectomy with 4 Fogarty. Bilateral lower extremity embolectomy with long 3 Fogarty and short 4 Fogarty. Aortogram. Stent of bilateral common iliac arteries with 7 x 59 mm VBX. Stent of right external iliac artery with 7 x 100 mm Innova and stent of left external iliac artery with 7 x 60 mm Innova. 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 Sharp excisional debridement of bilateral surgical groin wounds right side 7 x 6 x 4 cm left side 10 x 4 x 3 cm with pulse lavage and wound VAC placement. Wound cx from rt groin. Exposed patch in the rt groin with E coli, proteus mirabilis, bacteroides ovatus, bacteroides stercoris.    ROS: General- Denies fever, chills, loss of appetite and loss of weight HEENT - Denies headache, blurry vision, neck pain, sinus pain Chest - Denies any chest pain, SOB or cough CVS- Denies any dizziness/lightheadedness, syncopal attacks, palpitations Abdomen- Denies any nausea, vomiting, abdominal pain, hematochezia and diarrhea Neuro - Denies any weakness, numbness, tingling sensation Psych - Denies any changes in mood irritability or depressive symptoms GU- Denies any burning, dysuria, hematuria or increased frequency of urination Skin - denies any rashes/lesions MSK - soreness in the bilateral groin +  Past Medical History:  Diagnosis Date   (HFpEF) heart failure with preserved ejection fraction (HCC)    Angina pectoris (HCC)    Anxiety    Aortic atherosclerosis (HCC)    Basal cell carcinoma    Bilateral carotid artery disease (HCC)    a.) carotid doppler 03/18/2021: 9-52% RICA, 84-13% LICA   Bipolar disorder (HCC)    Cervicalgia    Chronic mesenteric ischemia (HCC)    a.) s/p  PTA 11/13/2020 --> 6 x 16 mm lifestream ballon expanding stent to SMA   COPD (chronic obstructive pulmonary disease) (HCC)    Coronary artery disease 03/20/2014   a.) LHC/PCI 03/20/2014: 70% mLAD (2.5 x 28 mm Xience Alpine DES), 70% dLCx, 70% dRCA; b.) LHC 01/25/2016: 90% ISR mLAD --> mLAD dissection with in stent --> 2.75 x 22 mm Resolute DES; c.) R/LHC 09/29/2016: 50% ISR mLAD, 70% dLAD, mPA 37, PCWP 24, AO sat 90%, PA sat 46%, CO 3.7, CI 2.46 - med mgmt   DDD (degenerative disc disease), cervical    Depression    Diabetic polyneuropathy (HCC)    GERD (gastroesophageal reflux disease)    Hyperlipidemia    Hypertension    Long term current use of antithrombotics/antiplatelets    a.) on DAPT (ASA + clopidogrel)   Memory loss    NSTEMI (non-ST elevated myocardial infarction) ([place]) 07/2016   a.) troponins trended (normal < 0.034 ng/mL): 0.325 --> 1.480 --> 2.330 --> 1.660 --> 0.169 ng/mL   PAD (peripheral artery disease) ([place])    a.) s/p PTA and stenting SMA 11/13/2020; b.) s/p PTA and stenting of RIGHT  SFA and popliteal 04/12/2021; c.) s/p PTA and kissing balloon stents to BILATERAL CIAs 05/28/2021   Respiratory failure with hypoxia (HCC)    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   a.) 23 mm Sapien 3 via suprasternal approach   Schizophrenia ([place])    Severe aortic stenosis    a.) s/p TAVR 09/30/2016; 23 mm Sapien 3 via a suprasternal approach   Type 2 diabetes mellitus treated with insulin ([place])    Vertigo    Past Surgical History:  Procedure Laterality Date   AMPUTATION Left 08/14/2022   Procedure: ABOVE KNEE AMPUTATION;  Surgeon: Waynetta Sandy, MD;  Location: [facility];  Service: Vascular;  Laterality: Left;   AORTIC VALVE REPLACEMENT  09/30/2016   Procedure: TRANSCATHETER AORTIC VALVE REPAIR   APPLICATION OF WOUND VAC Bilateral 08/14/2022   Procedure: APPLICATION OF WOUND VAC;  Surgeon: Waynetta Sandy, MD;  Location: [facility];  Service:  Vascular;  Laterality: Bilateral;   BLADDER SURGERY     CATARACT EXTRACTION, BILATERAL     CHOLECYSTECTOMY     COLONOSCOPY     CORONARY ANGIOPLASTY WITH STENT PLACEMENT  03/20/2014   Procedure: CORONARY ANGIOPLASTY WITH STENT PLACEMENT; Location: [facility]; Surgeon: Jacques Earthly, MD   CORONARY ANGIOPLASTY WITH STENT PLACEMENT Left 01/25/2016   Procedure: CORONARY ANGIOPLASTY WITH STENT PLACEMENT; Location: [facility]; Surgeon: Jacques Earthly, MD   ENDARTERECTOMY FEMORAL Bilateral 08/04/2022   Procedure: BILATERAL FEMORAL ENDARTERECTOMY;  Surgeon: Waynetta Sandy, MD;  Location: [facility];  Service: Vascular;  Laterality: Bilateral;   FASCIECTOMY Bilateral 08/04/2022   Procedure: BILATERAL LOWER EXTREMITY FASCIECTOMIES;  Surgeon: Waynetta Sandy, MD;  Location: [facility];  Service: Vascular;  Laterality: Bilateral;   FOOT SURGERY Right    GROIN DEBRIDEMENT Bilateral 08/14/2022   Procedure: GROIN DEBRIDEMENT;  Surgeon: Waynetta Sandy, MD;  Location: [facility];  Service: Vascular;  Laterality: Bilateral;   HEMORRHOID SURGERY     INSERTION OF ILIAC STENT Bilateral 08/04/2022   Procedure: INSERTION OF BILATERAL ILIAC STENTS;  Surgeon: Waynetta Sandy, MD;  Location: [facility];  Service: Vascular;  Laterality: Bilateral;   IRRIGATION AND DEBRIDEMENT HEMATOMA Left 07/08/2014   GROIN   LOWER EXTREMITY ANGIOGRAM Bilateral 08/04/2022   Procedure: LOWER EXTREMITY ANGIOGRAM;  Surgeon: Waynetta Sandy, MD;  Location: [facility];  Service: Vascular;  Laterality: Bilateral;   LOWER EXTREMITY ANGIOGRAPHY Right 04/12/2021   Procedure: Lower Extremity Angiography;  Surgeon: [name], MD;  Location: [facility];  Service: Cardiovascular;  Laterality: Right;   LOWER EXTREMITY ANGIOGRAPHY Left 05/28/2021   Procedure: LOWER EXTREMITY ANGIOGRAPHY;  Surgeon: [name], MD;  Location: [place] [facility];  Service: Cardiovascular;  Laterality: Left;   LOWER  EXTREMITY ANGIOGRAPHY Left 03/25/2022   Procedure: Lower Extremity Angiography;  Surgeon: [name], MD;  Location: [facility];  Service: Cardiovascular;  Laterality: Left;   PATCH ANGIOPLASTY  08/04/2022   Procedure: LEFT FEMORAL PATCH ANGIOPLASTY USING XENOSURE BIOLOGIC PATCH;  Surgeon: Waynetta Sandy, MD;  Location: [facility];  Service: Vascular;;   PSEUDOANEURYSM REPAIR Left 06/30/2014   FEMORAL ARTERY   RIGHT/LEFT HEART CATH AND CORONARY ANGIOGRAPHY Bilateral 09/29/2016   Procedure: RIGHT/LEFT HEART CATH AND CORONARY ANGIOGRAPHY; Location [facility]; Surgeon: Jacques Earthly, MD   THROMBECTOMY FEMORAL ARTERY Bilateral 08/04/2022   Procedure: BILATERAL LOWER EXTREMITY THROMBECTOMY;  Surgeon: Waynetta Sandy, MD;  Location: [facility];  Service: Vascular;  Laterality: Bilateral;   TUBAL LIGATION     VISCERAL ANGIOGRAPHY N/A  11/13/2020   Procedure: VISCERAL ANGIOGRAPHY;  Surgeon: [name], MD;  Location: [facility];  Service: Cardiovascular;  Laterality: N/A;     Scheduled Meds:  arformoterol  15 mcg Nebulization BID   atorvastatin  80 mg Oral Daily   budesonide (PULMICORT) nebulizer solution  0.5 mg Nebulization BID   carvedilol  12.5 mg Oral BID WC   Chlorhexidine Gluconate Cloth  6 each Topical Q0600   clopidogrel  75 mg Oral Daily   dronabinol  2.5 mg Oral BID AC   escitalopram  10 mg Oral Daily   feeding supplement  237 mL Oral TID WC   Gerhardt's butt cream   Topical BID   heparin injection (subcutaneous)  5,000 Units Subcutaneous Q8H   insulin aspart  0-6 Units Subcutaneous TID WC   insulin aspart  2 Units Subcutaneous TID WC   insulin glargine-yfgn  3 Units Subcutaneous Daily   metroNIDAZOLE  500 mg Oral Q12H   mirtazapine  7.5 mg Oral QHS   multivitamin with minerals  1 tablet Oral Daily   nicotine  14 mg Transdermal Daily   mouth rinse  15 mL Mouth Rinse Q4H   oxyCODONE  10 mg Oral Q12H   pantoprazole  40 mg Oral Daily    revefenacin  175 mcg Nebulization Daily   senna-docusate  1 tablet Oral QHS   sodium chloride flush  10-40 mL Intracatheter Q12H   sodium chloride flush  10-40 mL Intracatheter Q12H   Continuous Infusions:   ceFAZolin (ANCEF) IV 2 g (09/09/22 0311)   PRN Meds:.acetaminophen **OR** acetaminophen (TYLENOL) oral liquid 160 mg/5 mL **OR** acetaminophen, albuterol, hydrALAZINE, HYDROmorphone (DILAUDID) injection, ondansetron (ZOFRAN) IV, mouth rinse, oxyCODONE, polyethylene glycol, sodium chloride flush, traZODone  Allergies  Allergen Reactions   Iodinated Contrast Media Hives   Sulfa Antibiotics Hives   Shellfish Allergy Nausea And Vomiting, Rash and Other (See Comments)    Scallops specifically cause VOMITING     Social History   Socioeconomic History   Marital status: Married    Spouse name: [name]   Number of children: 2   Years of education: Not on file   Highest education level: High school graduate  Occupational History   Not on file  Tobacco Use   Smoking status: Every Day    Packs/day: 1.50    Years: 48.00    Total pack years: 72.00    Types: Cigarettes   Smokeless tobacco: Never  Vaping Use   Vaping Use: Never used  Substance and Sexual Activity   Alcohol use: Not Currently   Drug use: Not Currently   Sexual activity: Not Currently  Other Topics Concern   Not on file  Social History Narrative   Lives at home with [employer]   Social Determinants of Health   Financial Resource Strain: Medium Risk (10/19/2018)   Overall Financial Resource Strain (CARDIA)    Difficulty of Paying Living Expenses: Somewhat hard  Food Insecurity: Food Insecurity Present (08/11/2022)   Hunger Vital Sign    Worried About Running Out of Food in the Last Year: Sometimes true    Ran Out of Food in the Last Year: Sometimes true  Transportation Needs: No Transportation Needs (08/11/2022)   PRAPARE - Hydrologist (Medical): No    Lack of Transportation  (Non-Medical): No  Physical Activity: Inactive (10/19/2018)   Exercise Vital Sign    Days of Exercise per Week: 0 days    Minutes of Exercise per  Session: 0 min  Stress: Not on file  Social Connections: Unknown (10/19/2018)   Social Connection and Isolation Panel [NHANES]    Frequency of Communication with Friends and Family: Not on file    Frequency of Social Gatherings with Friends and Family: Not on file    Attends Religious Services: More than 4 times per year    Active Member of [employer] or Organizations: No    Attends Archivist Meetings: Never    Marital Status: Married  Human resources officer Violence: Not At Risk (08/11/2022)   Humiliation, Afraid, Rape, and Kick questionnaire    Fear of Current or Ex-Partner: No    Emotionally Abused: No    Physically Abused: No    Sexually Abused: No   Family History  Problem Relation Age of Onset   Mental illness Mother    Breast cancer Neg Hx    Vitals BP (!) 136/45 (BP Location: Right Arm)   Pulse 81   Temp 99.6 F (37.6 C) (Oral)   Resp 13   Ht 5' (1.524 m)   Wt 62.1 kg   SpO2 95%   BMI 26.76 kg/m    Physical Exam Constitutional:  elderly female lying in the bed, not in acute distress     Comments: Husband at bedside   Cardiovascular:     Rate and Rhythm: Normal rate and regular rhythm.     Heart sounds: s1s2  Pulmonary:     Effort: Pulmonary effort is normal on nasal cannula     Comments: Normal breath sounds   Abdominal:     Palpations: Abdomen is soft.     Tenderness: non distended and non tender   Musculoskeletal:        General: No swelling or tenderness in peripheral joints. Left AKA site healing wel with sutures +, no fluctuance, warmth and tenderness.  Bilateral groin wound with wound vac in place. No surrounding cellulitis   Skin:    Comments: No obvious rashes   Neurological:     General: awake, alert and oriented, grossly non focal    Pertinent Microbiology Results for orders placed or  performed during the hospital encounter of 08/04/22  Surgical PCR screen     Status: None   Collection Time: 08/04/22  4:17 AM   Specimen: Nasal Swab  Result Value Ref Range Status   MRSA, PCR NEGATIVE NEGATIVE Final   Staphylococcus aureus NEGATIVE NEGATIVE Final    Comment: (NOTE) The Xpert SA Assay (FDA approved for NASAL specimens in patients 9 years of age and older), is one component of a comprehensive surveillance program. It is not intended to diagnose infection nor to guide or monitor treatment. Performed at [facility], [facility] [address], [place], [place] [address]   Aerobic/Anaerobic Culture w Gram Stain (surgical/deep wound)     Status: None   Collection Time: 08/14/22  8:36 AM   Specimen: Wound; Abscess  Result Value Ref Range Status   Specimen Description WOUND  Final   Special Requests GROIN  Final   Gram Stain NO WBC SEEN RARE GRAM VARIABLE ROD   Final   Culture   Final    MODERATE ESCHERICHIA COLI RARE PROTEUS MIRABILIS MODERATE BACTEROIDES OVATUS MODERATE BACTEROIDES STERCORIS BETA LACTAMASE POSITIVE Performed at [facility], [place] [address] [place],  [address]    Report Status 08/17/2022 FINAL  Final   Organism ID, Bacteria ESCHERICHIA COLI  Final   Organism ID, Bacteria PROTEUS MIRABILIS  Final  Susceptibility   Escherichia coli - MIC*    AMPICILLIN <=2 SENSITIVE Sensitive     CEFAZOLIN <=4 SENSITIVE Sensitive     CEFEPIME <=0.12 SENSITIVE Sensitive     CEFTAZIDIME <=1 SENSITIVE Sensitive     CEFTRIAXONE <=0.25 SENSITIVE Sensitive     CIPROFLOXACIN <=0.25 SENSITIVE Sensitive     GENTAMICIN <=1 SENSITIVE Sensitive     IMIPENEM <=0.25 SENSITIVE Sensitive     TRIMETH/SULFA <=20 SENSITIVE Sensitive     AMPICILLIN/SULBACTAM <=2 SENSITIVE Sensitive     PIP/TAZO <=4 SENSITIVE Sensitive     * MODERATE ESCHERICHIA COLI   Proteus mirabilis - MIC*    AMPICILLIN <=2 SENSITIVE Sensitive     CEFAZOLIN <=4 SENSITIVE Sensitive      CEFEPIME <=0.12 SENSITIVE Sensitive     CEFTAZIDIME <=1 SENSITIVE Sensitive     CEFTRIAXONE <=0.25 SENSITIVE Sensitive     CIPROFLOXACIN <=0.25 SENSITIVE Sensitive     GENTAMICIN <=1 SENSITIVE Sensitive     IMIPENEM 4 SENSITIVE Sensitive     TRIMETH/SULFA <=20 SENSITIVE Sensitive     AMPICILLIN/SULBACTAM <=2 SENSITIVE Sensitive     PIP/TAZO <=4 SENSITIVE Sensitive     * RARE PROTEUS MIRABILIS     Pertinent Lab seen by me:    Latest Ref Rng & Units 09/08/2022   12:58 AM 09/06/2022    6:17 AM 09/05/2022    6:44 AM  CBC  WBC 4.0 - 10.5 K/uL 5.5  7.5  5.1   Hemoglobin 12.0 - 15.0 g/dL 9.8  9.4  11.9   Hematocrit 36.0 - 46.0 % 30.6  29.2  37.2   Platelets 150 - 400 K/uL 377  413  325       Latest Ref Rng & Units 09/08/2022   12:58 AM 09/06/2022    6:17 AM 09/05/2022    6:44 AM  CMP  Glucose 70 - 99 mg/dL 91  129  120   BUN 8 - 23 mg/dL '9  9  9   '$ Creatinine 0.44 - 1.00 mg/dL 0.81  0.76  0.69   Sodium 135 - 145 mmol/L 135  138  138   Potassium 3.5 - 5.1 mmol/L 3.5  3.5  3.8   Chloride 98 - 111 mmol/L 100  101  101   CO2 22 - 32 mmol/L '25  26  26   '$ Calcium 8.9 - 10.3 mg/dL 8.2  8.4  8.4      Pertinent Imagings/Other Imagings Plain films and CT images have been personally visualized and interpreted; radiology reports have been reviewed. Decision making incorporated into the Impression / Recommendations.  DG CHEST PORT 1 VIEW  Result Date: 08/28/2022 CLINICAL DATA:  Dyspnea. EXAM: PORTABLE CHEST 1 VIEW COMPARISON:  08/19/2022. FINDINGS: Heart size and mediastinal contour is stable. A TAVR stent is noted. There is atherosclerotic calcification of the aorta. Interstitial prominence is noted bilaterally. There are small bilateral pleural effusions. No pneumothorax. No acute osseous abnormality. A right internal jugular central venous catheter is stable. An enteric tube courses over the stomach and out of the field of view. IMPRESSION: 1. Interstitial prominence bilaterally, possible edema.  2. Small bilateral pleural effusions. Electronically Signed   By: Brett Fairy M.D.   On: 08/28/2022 03:37   DG CHEST PORT 1 VIEW  Result Date: 08/19/2022 CLINICAL DATA:  Shortness of breath EXAM: PORTABLE CHEST 1 VIEW COMPARISON:  08/16/2022 FINDINGS: Single frontal view of the chest demonstrates a stable cardiac silhouette. Aortic valve prosthesis, enteric catheter, and right internal jugular catheter  are stable. A kink within the proximal aspect of the right internal jugular catheter is unchanged, please correlate with catheter function. Cardiac silhouette is unremarkable. Interstitial prominence, patchy bibasilar consolidation, and trace right effusion seen on previous study are unchanged. No pneumothorax. No acute bony abnormalities. IMPRESSION: 1. Stable mild pulmonary edema. Electronically Signed   By: Randa Ngo M.D.   On: 08/19/2022 14:59   EEG adult  Result Date: 08/17/2022 Derek Jack, MD     08/17/2022  2:23 PM Routine EEG Report Heather [name] is a 72 y.o. female with a history of altered mental status and seizure-like episode who is undergoing an EEG to evaluate for seizures. Report: This EEG was acquired with electrodes placed according to the International 10-20 electrode system (including Fp1, Fp2, F3, F4, C3, C4, P3, P4, O1, O2, T3, T4, T5, T6, A1, A2, Fz, Cz, Pz). The following electrodes were missing or displaced: none. The occipital dominant rhythm was 5-6 Hz. This activity is reactive to stimulation. Drowsiness was manifested by background fragmentation; deeper stages of sleep were not identified. There was no focal slowing. There were no interictal epileptiform discharges. There were no electrographic seizures identified. Photic stimulation and hyperventilation were not performed. Impression and clinical correlation: This EEG was obtained while awake and drowsy and is abnormal due to moderate diffuse slowing indicative of global cerebral dysfunction. Epileptiform abnormalities  were not seen during this recording. Su Monks, MD [facility] [phone] If 7pm- 7am, please page neurology on call as listed in [place].   DG CHEST PORT 1 VIEW  Result Date: 08/16/2022 CLINICAL DATA:  Dyspnea EXAM: PORTABLE CHEST 1 VIEW COMPARISON:  08/16/2022 FINDINGS: Right internal jugular central venous catheter tip overlies the superior vena cava. The catheter appears kinked in its extravascular component, unchanged from prior examination. Nasoenteric feeding tube extends into the upper abdomen beyond the margin of the examination. The lungs are symmetrically well expanded. Mild diffuse interstitial pulmonary infiltrate is present in keeping with probable mild interstitial pulmonary edema. No pneumothorax or pleural effusion. Transcatheter aortic valve replacement has been performed. Cardiac size within normal limits. No acute bone abnormality. IMPRESSION: 1. Mild interstitial pulmonary edema. 2. Stable support lines and tubes. The right internal jugular central venous catheter appears kinked in its extravascular component. Electronically Signed   By: Fidela Salisbury M.D.   On: 08/16/2022 17:56   CT HEAD WO CONTRAST (5MM)  Result Date: 08/16/2022 CLINICAL DATA:  Stroke follow-up EXAM: CT HEAD WITHOUT CONTRAST TECHNIQUE: Contiguous axial images were obtained from the base of the skull through the vertex without intravenous contrast. RADIATION DOSE REDUCTION: This exam was performed according to the departmental dose-optimization program which includes automated exposure control, adjustment of the mA and/or kV according to patient size and/or use of iterative reconstruction technique. COMPARISON:  CT head 08/07/2022 FINDINGS: Brain: There has been interval expected evolution of the intraparenchymal hematoma centered in the cerebellum with overall decreased density. The hematoma currently measures up to 2.5 cm x 1.2 cm in the coronal plane, decreased in size from 3.0 cm x 1.5 cm. There  is mild surrounding edema with partial effacement of fourth ventricle but no upstream hydrocephalus There is no new acute intracranial hemorrhage or extra-axial fluid collection. There is no evidence of acute territorial infarct. Background chronic small-vessel ischemic change is stable. There is no solid mass lesion. There is no mass effect or midline shift. Vascular: There is calcification of the bilateral carotid siphons and vertebral arteries. Skull: Normal. Negative for fracture or focal  lesion. Sinuses/Orbits: The imaged paranasal sinuses are clear. Bilateral lens implants are in place. The globes and orbits are otherwise unremarkable. Other: None. IMPRESSION: 1. Expected evolution of the cerebellar hematoma which is decreased in size and density. Mild regional mass effect without upstream hydrocephalus. 2. No new acute intracranial pathology Electronically Signed   By: Valetta Mole M.D.   On: 08/16/2022 17:48   DG CHEST PORT 1 VIEW  Result Date: 08/16/2022 CLINICAL DATA:  Shortness of breath.  ICH EXAM: PORTABLE CHEST 1 VIEW COMPARISON:  08/08/2022 FINDINGS: Similar indistinct density at the lung bases. Small pleural effusions are possible. Normal heart size.  TAVR and coronary stenting. Right IJ line with catheter kink that is likely external. A feeding tube at least reaches the diaphragm. IMPRESSION: Continued atelectasis or pneumonia at the lung bases with small pleural effusions. Electronically Signed   By: Jorje Guild M.D.   On: 08/16/2022 07:42    I spent 100 minutes for this patient encounter including review of prior medical records/discussing diagnostics and treatment plan with the patient/family/coordinate care with primary/other specialits with greater than 50% of time in face to face encounter.   Electronically signed by:   Rosiland Oz, MD Infectious Disease Physician [facility] for Infectious Disease Pager: [phone]

## 2022-09-09 NOTE — Progress Notes (Signed)
PHARMACY CONSULT NOTE FOR:  OUTPATIENT  PARENTERAL ANTIBIOTIC THERAPY (OPAT)  Indication: Infected groin wound/graft Regimen: Cefazolin 2g IV every 8 hours +  Flagyl 500 mg po every 12 hours End date: 09/25/22  IV antibiotic discharge orders are pended. To discharging provider:  please sign these orders via discharge navigator,  Select New Orders & click on the button choice - Manage This Unsigned Work.     Thank you for allowing pharmacy to be a part of this patient's care.  Alycia Rossetti, PharmD, BCPS Infectious Diseases Clinical Pharmacist 09/09/2022 1:32 PM   **Pharmacist phone directory can now be found on [email or web address] ([email or web address]).  Listed under [place].

## 2022-09-09 NOTE — Progress Notes (Signed)
Mobility Specialist Progress Note:   09/09/22 1313  Mobility  Activity Turned to right side;Turned to left side;Turned to back - supine  Level of Assistance Minimal assist, patient does 75% or more  Assistive Device  (HHA)  Activity Response Tolerated well  $Mobility charge 1 Mobility   Pt in bed needing to get cleaned up. MinA to roll. Left in bed with call bell in reach and all needs met.   Gareth Eagle Nakyah Erdmann Mobility Specialist Please contact via [employer] or  Rehab Office at [phone]

## 2022-09-09 NOTE — Progress Notes (Signed)
VASCULAR SURGERY ASSESSMENT & PLAN:   ID: I have discussed the case with Dr. Nelwyn Salisbury.  Given that the stents in the common iliac artery are covered stents I think we should continue with the plan as previously outlined which would be 6 weeks of IV Ancef and 6 weeks of p.o. Flagyl starting on 08/14/2022.  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  SUBJECTIVE:   On 08/04/2022 this patient underwent bilateral femoral endarterectomies with bovine pericardial patch angioplasty, iliofemoral embolectomies and stenting of bilateral common iliac arteries and external iliac arteries.  The common iliac arteries were addressed with covered stents.  On 08/14/2022 the patient had a left above-the-knee amputation and debridement of both groin wounds.  On 08/14/2022 the patient had wound cultures which grew moderate E. coli, rare Proteus, moderate Bacteroides.  The E. coli and Proteus were sensitive to everything tested including cephalosporins.  Antibiotics have been discussed with Dr. Donzetta Matters who recommended 6 weeks of Ancef and Flagyl starting 08/14/2022.  PHYSICAL EXAM:   Vitals:   09/08/22 2018 09/08/22 2317 09/09/22 0312 09/09/22 0833  BP:  (!) 141/48 (!) 151/51 (!) 136/45  Pulse:  63 63 81  Resp:  '16 13 13  '$ Temp:  98 F (36.7 C) 98.5 F (36.9 C) 99.6 F (37.6 C)  TempSrc:  Oral Oral Oral  SpO2: 92% 92% 97% 95%  Weight:   62.1 kg   Height:       Her VAC's have good seals Right foot well perfused Left AKA looks fine  LABS:   Lab Results  Component Value Date   WBC 5.5 09/08/2022   HGB 9.8 (L) 09/08/2022   HCT 30.6 (L) 09/08/2022   MCV 91.6 09/08/2022   PLT 377 09/08/2022   Lab Results  Component Value Date   CREATININE 0.81 09/08/2022   CBG (last 3)  Recent Labs    09/09/22 0315 09/09/22 0609 09/09/22 1136  GLUCAP 175* 228* 224*    PROBLEM LIST:    Principal Problem:   ICH (intracerebral hemorrhage) (HCC) Active Problems:   Abscess of anal and  rectal regions   Decubitus ulcer of sacral region, stage 2 (HCC)   Pulmonary edema   CURRENT MEDS:    arformoterol  15 mcg Nebulization BID   atorvastatin  80 mg Oral Daily   budesonide (PULMICORT) nebulizer solution  0.5 mg Nebulization BID   carvedilol  12.5 mg Oral BID WC   Chlorhexidine Gluconate Cloth  6 each Topical Q0600   clopidogrel  75 mg Oral Daily   dronabinol  2.5 mg Oral BID AC   escitalopram  10 mg Oral Daily   feeding supplement  237 mL Oral TID WC   Gerhardt's butt cream   Topical BID   heparin injection (subcutaneous)  5,000 Units Subcutaneous Q8H   insulin aspart  0-6 Units Subcutaneous TID WC   insulin aspart  2 Units Subcutaneous TID WC   insulin glargine-yfgn  3 Units Subcutaneous Daily   metroNIDAZOLE  500 mg Oral Q12H   mirtazapine  7.5 mg Oral QHS   multivitamin with minerals  1 tablet Oral Daily   nicotine  14 mg Transdermal Daily   mouth rinse  15 mL Mouth Rinse Q4H   oxyCODONE  10 mg Oral Q12H   pantoprazole  40 mg Oral Daily   revefenacin  175 mcg Nebulization Daily   senna-docusate  1 tablet Oral QHS   sodium chloride flush  10-40 mL Intracatheter Q12H  sodium chloride flush  10-40 mL Intracatheter [place] Office: [phone] 09/09/2022

## 2022-09-09 NOTE — Progress Notes (Signed)
PROGRESS NOTE Heather [name]  ZSM:[id number] DOB: 06-Jan-1951 DOA: 08/04/2022 PCP: [name], FNP   Brief Narrative/Hospital Course: 72 y.o. female with PMH significant for DM2, neuropathy, HTN, HLD, COPD, GERD, PAD, AS s/p TAVR, chronic diastolic CHF, anxiety/depression/schizophrenia, tobacco abuse who presented to the emergency room on 49/20 with systolic blood pressure in the 200s and symptomatology including dizziness, headache and blurred vision.  Patient was admitted to ICU for hemorrhagic stroke and bilateral lower extremity acute on chronic ischemia.  Vascular surgery was consulted and s/p bilateral lower extremity femoral endarterectomy and patch angioplasty with iliac stenting, 4 compartment fasciotomies for acute on chronic lower extremity ischemia. Postop remained on vent and Cleviprex.eventually, able to be extubated and weaned off pressor support and transferred to hospitalist service on 12/17.  Eventually able to be weaned off of transferred to floor and [place] on 12/17.  Hospital course since then noteworthy for episodes of hypoglycemia with insulin adjustments.  Initially required tube feedings but as oral intake improved, able to be discontinued.  Patient remains weak deconditioned unable to mobilize, needing wound care with [facility] change Monday Wednesday Friday.  Insurance has denied LTACH despite peer-to-peer review and approved for skilled nursing facility which family has agreed.  Subjective: Patient seen and examined this morning.  He still complains of constipation otherwise no complaint She reports she has been eating at least 75% of her food Overnight BP stable in 130s to 150s on 2 L nasal cannula/room air afebrile She had hypoglycemia 42 at 2343 1/15  Assessment and Plan: Principal Problem:   ICH (intracerebral hemorrhage) (HCC) Active Problems:   Abscess of anal and rectal regions   Decubitus ulcer of sacral region, stage 2 [facility])   Pulmonary edema  Rhabdomyolysis Left  AKA and excisional debridement of bilateral groin wounds with VAC placement 08/15/23 Bilateral CFA endarterectomy with bovine patch angioplasty Bilateral iliofemoral embolectomy, stenting Bilateral CIA, stent Rt EIA and 4 compartment fasciotomies: Followed by vascular surger.Patient with baseline Doppler flow right DP. S/P procedures as above.  Bilateral groin appears to be healing well on wound VAC  dressing change MWF as per vascular.   Continue Plavix/statin. Continue Ancef and Flagyl x 6 wk from 08/14/22 for E. coli and Proteus [name] in wound culture from OR. In discussion with surgery Dr Donzetta Matters and Dr Olena Mater is to keep antibiotics for 6 weeks total to treat any potential graft infection.  She has a left arm midline in place.  Asked ID Dr [place] Bali to review.  Acute ICH w/ acute intraparenchymal hematoma in cerebellar vermis measuring 11 cc in volume and mild mass effect upon the fourth ventricle:Confirmed with an MRI. Seen by neurology.  Eventually placed on antiplatelet agents tolerating Plavix 75.  Neurologically stable  Acute respiratory failure with hypoxia COPD/Smoker : Extubated 12/14> currently doing well on room air at x 2 L nasal cannula.Cont Brovana Pulmicort,Yupleri and albuterol as needed.   Chronic diastolic CHF HTN: BP is fairly controlled, volume status stable.Echo EF 60 to 60% on 12/11 w/  indeterminate diastolic parameters.  Negative balance as below monitor volume status.  Continue Coreg, prn Hydralazine. 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  Diabetes coordinator consulted.  On 2 units Premeal insulin and 5 units semeglee>cut down to  3 units, change SSI to 0-6 u.  Encouraged for oral intake Recent Labs  Lab 09/08/22 2100 09/08/22 2343 09/09/22 0003 09/09/22 0315 09/09/22 0609  GLUCAP 108* 42* 217* 175* 228*      Hx of CAD HLD AS s/p TAVR: Stable, no chest pain.Continue Plavix Lipitor.     Acute blood loss anemia in the setting of surgery: s/p 3 units PRBC so far. Hb stable Recent Labs  Lab 09/03/22 0054 09/04/22 0315 09/05/22 0644 09/06/22 0617 09/08/22 0058  HGB 8.9* 8.8* 11.9* 9.4* 9.8*  HCT 28.0* 28.8* 37.2 29.2* 30.6*   Depression, bipolar, schizophrenia, insomnia: mood has been stable.Continue on Lexapro daily,Remeron nightly, trazodone nightly.,    Dysphagia:Likely related to [place].core track has been removed.  Tolerating p.o. well augment diet, she does not want to have NG tube or tube feeding. She has had very poor oral intake>was discussed with her in detail she does not want any kind of feeding tube palliative care following.she reports she has been eating well now.   Family encouraged for oral nutrition  or else she will not improve and will not heal  Constipation added Dulcolax, can do manual disimpaction if she is impacted.  Pressure injury stage II sacrum POA see below: Pressure Injury 08/04/22 Sacrum Stage 2 -  Partial thickness loss of dermis presenting as a shallow open injury with a red, pink wound bed without slough. (Active)  08/04/22 0100  Location: Sacrum  Location Orientation:   Staging: Stage 2 -  Partial thickness loss of dermis presenting as a shallow open injury with a red, pink wound bed without slough.  Wound Description (Comments):   Present on Admission: Yes  Dressing Type Foam - Lift dressing to assess site every shift 08/31/22 1950   DVT prophylaxis: heparin injection 5,000 Units Start: 08/10/22 1600 Code Status:   Code Status: Full Code Family Communication: plan of care discussed with patient at bedside. Discussed with palliative care team today.  Patient status  is: inpatient  because of poor po intake Level of care: Telemetry Medical  Dispo: The patient is from: Home, w/ Husband.  Daughters are involved in care patient prefers her husband to make decision            Anticipated disposition: Biochemist, clinical for skilled nursing SNF pending. LTACH denied  Objective: Vitals last 24 hrs: Vitals:   09/08/22 2018 09/08/22 2317 09/09/22 0312 09/09/22 0833  BP:  (!) 141/48 (!) 151/51 (!) 136/45  Pulse:  63 63 81  Resp:  '16 13 13  '$ Temp:  98 F (36.7 C) 98.5 F (36.9 C) 99.6 F (37.6 C)  TempSrc:  Oral Oral Oral  SpO2: 92% 92% 97% 95%  Weight:   62.1 kg   Height:       Weight change: 4.536 kg  Physical Examination:  General exam: AAox3, weak,older appearing HEENT:Oral mucosa moist, Ear/Nose WNL grossly, dentition normal. Respiratory system: bilaterally clear BS,no use of accessory muscle Cardiovascular system: S1 & S2 +, regular rate. Gastrointestinal system: Abdomen soft, NT,ND,BS+ Nervous System:Alert, awake, moving extremities and grossly nonfocal Extremities: Left BKA with intact staple, right leg incision site healed, bilateral groin with wound VAC in place Skin: No rashes,no icterus. MSK: Normal muscle bulk,tone, power    Medications reviewed:  Scheduled Meds:  arformoterol  15 mcg Nebulization BID   atorvastatin  80 mg Per Tube Daily   budesonide (PULMICORT) nebulizer solution  0.5 mg  Nebulization BID   carvedilol  12.5 mg Per Tube BID WC   Chlorhexidine Gluconate Cloth  6 each Topical Q0600   clopidogrel  75 mg Oral Daily   dronabinol  2.5 mg Oral BID AC   escitalopram  10 mg Per Tube Daily   feeding supplement  237 mL Oral TID WC   Gerhardt's butt cream   Topical BID   heparin injection (subcutaneous)  5,000 Units Subcutaneous Q8H   insulin aspart  0-15 Units Subcutaneous Q4H   insulin aspart  2 Units Subcutaneous TID WC   insulin glargine-yfgn  5 Units Subcutaneous Daily   metroNIDAZOLE  500 mg Per Tube Q12H    mirtazapine  7.5 mg Per Tube QHS   multivitamin with minerals  1 tablet Per Tube Daily   nicotine  14 mg Transdermal Daily   mouth rinse  15 mL Mouth Rinse Q4H   oxyCODONE  10 mg Oral Q12H   pantoprazole  40 mg Oral Daily   revefenacin  175 mcg Nebulization Daily   senna-docusate  1 tablet Oral QHS   sodium chloride flush  10-40 mL Intracatheter Q12H   sodium chloride flush  10-40 mL Intracatheter Q12H  Continuous Infusions:   ceFAZolin (ANCEF) IV 2 g (09/09/22 0311)   Diet Order             DIET DYS 3 Room service appropriate? Yes with Assist; Fluid consistency: Thin  Diet effective now                  Intake/Output Summary (Last 24 hours) at 09/09/2022 0837 Last data filed at 09/08/2022 2317 Gross per 24 hour  Intake 2133.78 ml  Output 1250 ml  Net 883.78 ml    Net IO Since Admission: -577.78 mL [09/09/22 0837]  Wt Readings from Last 3 Encounters:  09/09/22 62.1 kg  08/03/22 58.7 kg  07/01/22 54.4 kg     Unresulted Labs (From admission, onward)    None     Data Reviewed:  Hemoglobin improving at 9.8, basic metabolic panel normal  Antimicrobials: Anti-infectives (From admission, onward)    Start     Dose/Rate Route Frequency Ordered Stop   08/20/22 2200  metroNIDAZOLE (FLAGYL) IVPB 500 mg  Status:  Discontinued        500 mg 100 mL/hr over 60 Minutes Intravenous Every 12 hours 08/20/22 1110 08/20/22 1111   08/20/22 2200  metroNIDAZOLE (FLAGYL) tablet 500 mg        500 mg Per Tube Every 12 hours 08/20/22 1111     08/18/22 2300  ceFAZolin (ANCEF) IVPB 2g/100 mL premix        2 g 200 mL/hr over 30 Minutes Intravenous Every 8 hours 08/18/22 1455     08/18/22 2300  metroNIDAZOLE (FLAGYL) IVPB 500 mg  Status:  Discontinued        500 mg 100 mL/hr over 60 Minutes Intravenous Every 6 hours 08/18/22 1455 08/20/22 1110   08/15/22 1100  vancomycin (VANCOCIN) IVPB 1000 mg/200 mL premix  Status:  Discontinued        1,000 mg 200 mL/hr over 60 Minutes Intravenous  Every 24 hours 08/14/22 1006 08/15/22 1307   08/14/22 1400  piperacillin-tazobactam (ZOSYN) IVPB 3.375 g  Status:  Discontinued        3.375 g 12.5 mL/hr over 240 Minutes Intravenous Every 8 hours 08/14/22 1006 08/18/22 1455   08/14/22 1100  vancomycin (VANCOREADY) IVPB 1500 mg/300 mL        1,500  mg 150 mL/hr over 120 Minutes Intravenous  Once 08/14/22 1006 08/14/22 1502   08/04/22 1045  ceFAZolin (ANCEF) IVPB 2g/100 mL premix        2 g 200 mL/hr over 30 Minutes Intravenous On call to O.R. 08/04/22 0958 08/04/22 1100     Culture/Microbiology    Component Value Date/Time   SDES WOUND 08/14/2022 0836   SPECREQUEST GROIN 08/14/2022 0836   CULT  08/14/2022 0836    MODERATE ESCHERICHIA COLI RARE PROTEUS MIRABILIS MODERATE BACTEROIDES OVATUS MODERATE BACTEROIDES STERCORIS BETA LACTAMASE POSITIVE Performed at [facility], [place] [address], [place], [place] [address]    REPTSTATUS 08/17/2022 FINAL 08/14/2022 5974    Radiology Studies: No results found.   LOS: 36 days   Antonieta Pert, MD [facility]  09/09/2022, 8:37 AM

## 2022-09-09 NOTE — Progress Notes (Signed)
Calorie Count Note  48 hour calorie count ordered.  Diet: Dysphagia 3 Supplements: Ensure Enlive - TID, Magic Cup - TID  Day 1 Breakfast: 125 calories, 6 gm protein Lunch: No Documentation (100% recorded within chart, unsure what was consumed) Dinner: No Documentation Supplements: None  Total intake: 125 kcal (7% of minimum estimated needs)  6 protein (6% of minimum estimated needs)  Nutrition Diagnosis: Increased nutrient needs related to wound healing as evidenced by estimated needs   Goal: Patient will meet greater than or equal to 90% of their needs    Intervention:  Ensure Enlive po TID, each supplement provides 350 kcal and 20 grams of protein. Magic cup TID with meals, each supplement provides 290 kcal and 9 grams of protein  Hermina Barters RD, LDN Clinical Dietitian See Shea Evans for contact information.

## 2022-09-10 DIAGNOSIS — I614 Nontraumatic intracerebral hemorrhage in cerebellum: Secondary | ICD-10-CM | POA: Diagnosis not present

## 2022-09-10 DIAGNOSIS — S31109A Unspecified open wound of abdominal wall, unspecified quadrant without penetration into peritoneal cavity, initial encounter: Secondary | ICD-10-CM

## 2022-09-10 LAB — GLUCOSE, CAPILLARY
Glucose-Capillary: 135 mg/dL — ABNORMAL HIGH (ref 70–99)
Glucose-Capillary: 151 mg/dL — ABNORMAL HIGH (ref 70–99)
Glucose-Capillary: 160 mg/dL — ABNORMAL HIGH (ref 70–99)
Glucose-Capillary: 216 mg/dL — ABNORMAL HIGH (ref 70–99)
Glucose-Capillary: 240 mg/dL — ABNORMAL HIGH (ref 70–99)
Glucose-Capillary: 255 mg/dL — ABNORMAL HIGH (ref 70–99)
Glucose-Capillary: 269 mg/dL — ABNORMAL HIGH (ref 70–99)

## 2022-09-10 MED ORDER — PROSOURCE PLUS PO LIQD
30.0000 mL | Freq: Two times a day (BID) | ORAL | Status: DC
  Administered 2022-09-11: 30 mL via ORAL
  Filled 2022-09-10 (×7): qty 30

## 2022-09-10 MED ORDER — INSULIN GLARGINE-YFGN 100 UNIT/ML ~~LOC~~ SOLN
5.0000 [IU] | Freq: Every day | SUBCUTANEOUS | Status: DC
  Administered 2022-09-10 – 2022-09-18 (×8): 5 [IU] via SUBCUTANEOUS
  Filled 2022-09-10 (×9): qty 0.05

## 2022-09-10 NOTE — Progress Notes (Signed)
SLP Cancellation Note  Patient Details Name: Heather [name] MRN: [id number] DOB: 1951-07-23   Cancelled treatment:       Reason Eval/Treat Not Completed: Other (comment). SLP received new order with the goal of upgrading diet to regular to offer a greater variety of foods for better nutrition. In prior sessions pt was offered trials of floor stock solids (graham cracker usually the only food available) with very poor ability to manage it. The following is from a prior session documented by SLP, "Ultimately, though pt would love to eat a regular texture, she does not appear capable. This is not an impairment that would resolve with therapy. 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 She is likely to need a modified solid texture into the future. For now, will attempt a mechanical soft which may make the food more palatable, but may also lead to increased effort and mastication time. Will f/u for tolerance though benefit of ongoing therapy is minimal." The best way to test if pt will consume a greater quantity with a more effortful texture is to attempt it. Will advance diet to regular and f/u tomorrow to observe pt with a meal tray.    Heather [name], [name] 09/10/2022, 2:27 PM

## 2022-09-10 NOTE — Progress Notes (Signed)
Calorie Count Note  48 hour calorie count ordered.  Diet: Dysphagia 3 Supplements: Ensure Enlive - TID, Magic Cup - TID  Day 1 Breakfast: 0 calories, 0 gm protein Lunch: 31 calories, 1 gm protein Dinner: 41 calories, 1 gm protein Supplements: None  Total intake: 72 kcal (4% of minimum estimated needs)  2 protein (2% of minimum estimated needs)  Nutrition Diagnosis: Increased nutrient needs related to wound healing as evidenced by estimated needs   Goal: Patient will meet greater than or equal to 90% of their needs    Intervention:  Ensure Enlive po TID, each supplement provides 350 kcal and 20 grams of protein. Magic cup TID with meals, each supplement provides 290 kcal and 9 grams of protein  Hermina Barters RD, LDN Clinical Dietitian See Shea Evans for contact information.

## 2022-09-10 NOTE — TOC Progression Note (Signed)
Transition of Care [facility]) - Progression Note    Patient Details  Name: Heather [name] MRN: [id number] Date of Birth: 05-03-1951  Transition of Care [facility]) CM/SW [place], [place] Phone Number: 09/10/2022, 3:35 PM  Clinical Narrative:     CSW called spouse to provide SNF bed offers- VM full, unable to leave message   TOC will continue to follow and assist with discharge planning.  Thurmond Butts, MSW, LCSW Clinical Social Worker    Expected Discharge Plan: Skilled Nursing Facility Barriers to Discharge: [place] (PASRR), Insurance Authorization, Continued Medical Work up, SNF Pending bed offer (cortrak)  Expected Discharge Plan and Services In-house Referral: Clinical Social Work   Post Acute Care Choice: Long Term Acute Care (LTAC) Living arrangements for the past 2 months: [place] Determinants of Health (SDOH) Interventions [place]: Food Insecurity Present (08/11/2022)  Housing: Low Risk  (08/11/2022)  Transportation Needs: No Transportation Needs (08/11/2022)  Utilities: Not At Risk (08/11/2022)  Financial Resource Strain: Medium Risk (10/19/2018)  Physical Activity: Inactive (10/19/2018)  Social Connections: Unknown (10/19/2018)  Tobacco Use: High Risk (08/15/2022)    Readmission Risk Interventions    11/13/2020    2:27 PM  Readmission Risk Prevention Plan  Transportation Screening Complete  PCP or Specialist Appt within 3-5 Days Complete  HRI or [place] Complete  Social Work Consult for [place] Planning/Counseling Complete  Palliative Care Screening Not Applicable  Medication Review Press photographer) Complete

## 2022-09-10 NOTE — Progress Notes (Signed)
PROGRESS NOTE Heather [name]  TKP:[id number] DOB: Jun 30, 1951 DOA: 08/04/2022 PCP: [name], FNP   Brief Narrative/Hospital Course: 72 y.o. female with PMH significant for DM2, neuropathy, HTN, HLD, COPD, GERD, PAD, AS s/p TAVR, chronic diastolic CHF, anxiety/depression/schizophrenia, tobacco abuse who presented to the emergency room on 51/70 with systolic blood pressure in the 200s and symptomatology including dizziness, headache and blurred vision.  Patient was admitted to ICU for hemorrhagic stroke and bilateral lower extremity acute on chronic ischemia.  Vascular surgery was consulted and s/p bilateral lower extremity femoral endarterectomy and patch angioplasty with iliac stenting, 4 compartment fasciotomies for acute on chronic lower extremity ischemia. Postop remained on vent and Cleviprex.eventually, able to be extubated and weaned off pressor support and transferred to hospitalist service on 12/17.  Eventually able to be weaned off of transferred to floor and [place] on 12/17.  Hospital course since then noteworthy for episodes of hypoglycemia with insulin adjustments.  Initially required tube feedings but as oral intake improved, able to be discontinued.  Patient remains weak deconditioned unable to mobilize, needing wound care with [facility] change Monday Wednesday Friday.  Insurance has denied LTACH despite peer-to-peer review and approved for skilled nursing facility which family has agreed.  Subjective:  Patient seen and examined this morning she is resting comfortably reports she had a bowel movement  Assessment and Plan: Principal Problem:   ICH (intracerebral hemorrhage) (HCC) Active Problems:   Abscess of anal and rectal regions   Decubitus ulcer of sacral region, stage 2 (HCC)   Pulmonary edema  Rhabdomyolysis Left AKA and excisional debridement of bilateral groin wounds with VAC placement 08/15/23 Bilateral CFA endarterectomy with bovine patch angioplasty Bilateral iliofemoral  embolectomy, stenting Bilateral CIA, stent Rt EIA and 4 compartment fasciotomies: Followed by vascular surger.Patient with baseline Doppler flow right DP. S/P procedures as above.  Bilateral groin appears to be healing well on wound VAC  dressing change MWF as per vascular.   Continue Plavix/statin. Continue Ancef and Flagyl x 6 wk from 08/14/22 for E. coli and Proteus [name] in wound culture from OR: This was discussed with Dr. West Bali from [facility] along with vascular team Dr Donzetta Matters and Dr Carlis Abbott. She has a left arm midline in place.   Acute ICH w/ acute intraparenchymal hematoma in cerebellar vermis measuring 11 cc in volume and mild mass effect upon the fourth ventricle:Confirmed with an MRI. Seen by neurology.  Eventually placed on antiplatelet agents tolerating Plavix 75.  Neurologically stable  Acute respiratory failure with hypoxia COPD/Smoker : Extubated 12/14> currently doing well on room air this morning.  Continue current Brovana Pulmicort,Yupleri and albuterol as needed.   Chronic diastolic CHF HTN: BP is stable.  Volume status stable.Echo EF 60 to 60% on 12/11 w/  indeterminate diastolic parameters.  Negative balance as below monitor volume status.  Continue Coreg, prn Hydralazine. Net IO Since Admission: -952.78 mL [09/10/22 1000] Intake/Output Summary (Last 24 hours) at 09/10/2022 1000 Last data filed at 09/10/2022 0500 Gross per 24 hour  Intake 500 ml  Output 875 ml  Net -375 ml   Type 2 diabetes mellitus with uncontrolled hyperglycemia and episode of hypoglycemia 1/15 night:A1c poorly controlled 12.9, she has brittle diabetes.  Diabetes coordinator consulted.  Continue Semglee 3 units, SSI and 2 u premeal insulin. Encouraged for oral intake Recent Labs  Lab 09/09/22 1621 09/09/22 2014 09/10/22 0002 09/10/22 0401 09/10/22 0747  GLUCAP 309* 274* 269* 216* 160*      Hx of CAD HLD AS s/p TAVR: Stable,  no chest pain.Continue Plavix Lipitor.     Acute blood loss anemia  in the setting of surgery: s/p 3 units PRBC so far. Hb stable Recent Labs  Lab 09/04/22 0315 09/05/22 0644 09/06/22 0617 09/08/22 0058  HGB 8.8* 11.9* 9.4* 9.8*  HCT 28.8* 37.2 29.2* 30.6*   Depression, bipolar, schizophrenia, insomnia: mood has been stable.Continue on Lexapro daily,Remeron nightly, trazodone nightly.,    Dysphagia:Likely related to [place].core track has been removed.  Tolerating p.o. well augment diet, she does not want to have NG tube or tube feeding. She has had very poor oral intake>was discussed with her in detail she does not want any kind of feeding tube palliative care following.she reports she has been eating well now.   Family encouraged for oral nutrition  or else she will not improve and will not heal Dietitian has started 48-hour calorie count 1/16  Constipation improved with stool softener  Pressure injury stage II sacrum POA see below: Pressure Injury 08/04/22 Sacrum Stage 2 -  Partial thickness loss of dermis presenting as a shallow open injury with a red, pink wound bed without slough. (Active)  08/04/22 0100  Location: Sacrum  Location Orientation:   Staging: Stage 2 -  Partial thickness loss of dermis presenting as a shallow open injury with a red, pink wound bed without slough.  Wound Description (Comments):   Present on Admission: Yes  Dressing Type Foam - Lift dressing to assess site every shift 08/31/22 1950   DVT prophylaxis: heparin injection 5,000 Units Start: 08/10/22 1600 Code Status:   Code Status: Full Code Family Communication: plan of care discussed with patient at bedside. Discussed with palliative care team today.  Patient status is: inpatient  because of poor po intake Level of care: Telemetry Medical  Dispo: The patient is from: Home, w/ Husband.  Daughters are involved in care patient prefers her husband to make decision            Anticipated disposition: Biochemist, clinical for skilled nursing SNF pending. LTACH  denied  Objective: Vitals last 24 hrs: Vitals:   09/10/22 0403 09/10/22 0744 09/10/22 0746 09/10/22 0747  BP: (!) 123/56   (!) 141/57  Pulse: 70 65  62  Resp: '16 16  15  '$ Temp: 99 F (37.2 C)   98.9 F (37.2 C)  TempSrc: Oral   Oral  SpO2: 90% 95% (!) 6% 95%  Weight: 62.6 kg     Height:       Weight change: 0.454 kg  Physical Examination: General exam: AAox3, weak,older appearing HEENT:Oral mucosa moist, Ear/Nose WNL grossly, dentition normal. Respiratory system: bilaterally CLEAR BS, no use of accessory muscle Cardiovascular system: S1 & S2 +, regular rate. Gastrointestinal system: Abdomen soft,NT,ND,BS+ Nervous System:Alert, awake, moving extremities and grossly nonfocal Extremities: Lt BKA, w/ intact staple, b/l groin w. Wound vac, rt leg incision site healed Skin:No rashes,no icterus. DQQ:IWLNLG muscle bulk,tone, power   Medications reviewed:  Scheduled Meds:  arformoterol  15 mcg Nebulization BID   atorvastatin  80 mg Oral Daily   budesonide (PULMICORT) nebulizer solution  0.5 mg Nebulization BID   carvedilol  12.5 mg Oral BID WC   Chlorhexidine Gluconate Cloth  6 each Topical Q0600   clopidogrel  75 mg Oral Daily   dronabinol  2.5 mg Oral BID AC   escitalopram  10 mg Oral Daily   feeding supplement  237 mL Oral TID WC   Gerhardt's butt cream   Topical BID   heparin injection (subcutaneous)  5,000 Units Subcutaneous Q8H   insulin aspart  0-6 Units Subcutaneous TID WC   insulin aspart  2 Units Subcutaneous TID WC   insulin glargine-yfgn  3 Units Subcutaneous Daily   metroNIDAZOLE  500 mg Oral Q12H   mirtazapine  7.5 mg Oral QHS   multivitamin with minerals  1 tablet Oral Daily   nicotine  14 mg Transdermal Daily   mouth rinse  15 mL Mouth Rinse Q4H   oxyCODONE  10 mg Oral Q12H   pantoprazole  40 mg Oral Daily   revefenacin  175 mcg Nebulization Daily   senna-docusate  1 tablet Oral QHS   sodium chloride flush  10-40 mL Intracatheter Q12H   sodium chloride  flush  10-40 mL Intracatheter Q12H  Continuous Infusions:   ceFAZolin (ANCEF) IV 2 g (09/10/22 0308)   Diet Order             DIET DYS 3 Room service appropriate? Yes with Assist; Fluid consistency: Thin  Diet effective now                  Intake/Output Summary (Last 24 hours) at 09/10/2022 1000 Last data filed at 09/10/2022 0500 Gross per 24 hour  Intake 500 ml  Output 875 ml  Net -375 ml    Net IO Since Admission: -952.78 mL [09/10/22 1000]  Wt Readings from Last 3 Encounters:  09/10/22 62.6 kg  08/03/22 58.7 kg  07/01/22 54.4 kg     Unresulted Labs (From admission, onward)    None     Data Reviewed:  Hemoglobin improving at 9.8, basic metabolic panel normal  Antimicrobials: Anti-infectives (From admission, onward)    Start     Dose/Rate Route Frequency Ordered Stop   09/09/22 1100  metroNIDAZOLE (FLAGYL) tablet 500 mg        500 mg Oral Every 12 hours 09/09/22 1002     08/20/22 2200  metroNIDAZOLE (FLAGYL) IVPB 500 mg  Status:  Discontinued        500 mg 100 mL/hr over 60 Minutes Intravenous Every 12 hours 08/20/22 1110 08/20/22 1111   08/20/22 2200  metroNIDAZOLE (FLAGYL) tablet 500 mg  Status:  Discontinued        500 mg Per Tube Every 12 hours 08/20/22 1111 09/09/22 1002   08/18/22 2300  ceFAZolin (ANCEF) IVPB 2g/100 mL premix        2 g 200 mL/hr over 30 Minutes Intravenous Every 8 hours 08/18/22 1455     08/18/22 2300  metroNIDAZOLE (FLAGYL) IVPB 500 mg  Status:  Discontinued        500 mg 100 mL/hr over 60 Minutes Intravenous Every 6 hours 08/18/22 1455 08/20/22 1110   08/15/22 1100  vancomycin (VANCOCIN) IVPB 1000 mg/200 mL premix  Status:  Discontinued        1,000 mg 200 mL/hr over 60 Minutes Intravenous Every 24 hours 08/14/22 1006 08/15/22 1307   08/14/22 1400  piperacillin-tazobactam (ZOSYN) IVPB 3.375 g  Status:  Discontinued        3.375 g 12.5 mL/hr over 240 Minutes Intravenous Every 8 hours 08/14/22 1006 08/18/22 1455   08/14/22 1100   vancomycin (VANCOREADY) IVPB 1500 mg/300 mL        1,500 mg 150 mL/hr over 120 Minutes Intravenous  Once 08/14/22 1006 08/14/22 1502   08/04/22 1045  ceFAZolin (ANCEF) IVPB 2g/100 mL premix        2 g 200 mL/hr over 30 Minutes Intravenous On call to O.R. 08/04/22  9106 08/04/22 1100     Culture/Microbiology    Component Value Date/Time   SDES WOUND 08/14/2022 0836   SPECREQUEST GROIN 08/14/2022 0836   CULT  08/14/2022 0836    MODERATE ESCHERICHIA COLI RARE PROTEUS MIRABILIS MODERATE BACTEROIDES OVATUS MODERATE BACTEROIDES STERCORIS BETA LACTAMASE POSITIVE Performed at [facility], [place] [address] [place], [place] [address]    REPTSTATUS 08/17/2022 FINAL 08/14/2022 9694    Radiology Studies: No results found.   LOS: 78 days   Antonieta Pert, MD [facility]  09/10/2022, 10:00 AM

## 2022-09-10 NOTE — Progress Notes (Signed)
Nutrition Follow-up  DOCUMENTATION CODES:   Not applicable  INTERVENTION:   Encourage good PO intake  Assist in meal set-up at every meal Continue Multivitamin w/ minerals daily Discontinue Ensure Enlive po TID, each supplement provides 350 kcal and 20 grams of protein. Discontinue Magic cup TID with meals, each supplement provides 290 kcal and 9 grams of protein 30 ml ProSource Plus BID, each supplement provides 100 kcals and 15 grams protein.  Recommend SLP evaluation.  NUTRITION DIAGNOSIS:   Increased nutrient needs related to wound healing as evidenced by estimated needs. - Ongoing   GOAL:   Patient will meet greater than or equal to 90% of their needs - Ongoing  MONITOR:   PO intake, Supplement acceptance, Diet advancement, Labs, Weight trends, TF tolerance, Skin, I & O's  REASON FOR ASSESSMENT:   Consult Assessment of nutrition requirement/status, Calorie Count  ASSESSMENT:   Pt with PMH of CHF, anxiety, PAD, bipolar, COPD, CAD, depression, DM with neuropathy, GERD, HTN, HLD, schizophrenia admitted with acute ICH and critical limb ischemia.  12/11: s/p b/l common femoral endarterectomy, iliofemoral endarterectomy, lower extremity embolectomy and stent of b/l common iliac arteries and Rt external iliac artery, and b/l 4 compartment fasciotomies  S/p cortrak placement; per xray tip in distal stomach or proximal duodenum  12/14: extubated 12/16: transferred from 4N to 4E 12/18: underwent FEES; advanced to dysphagia 2 diet with thin liquids per SLP, however there is a comment on the diet order for milk precautions: no milk to drink, no ice cream, milkshakes, milk-like supplements as pt had penetration with thin milk on study completed 12/19: plan per team was to discontinue tube feeds, but after discussing with MD regarding poor PO intake, changed to nocturnal tube feeds instead 12/21: s/p left AKA and shart excisional debridement of bilateral surgical groin wounds and  wound VAC placement 12/22: seen by SLP and diet advanced to dysphagia 3 with thin liquids; per comment section of this diet order no milk on tray but other dairy okay 12/27: diet downgraded to dysphagia 1 with thin liquids by MD per request of patient's family 12/29: SLP removed milk restrictions on tray, so now able to order Ensure supplements 12/31: diet upgraded from dysphagia 1 to dysphagia 3 1/4: overnight pt had rapid response for SOB and hypoxia and required BiPAP so tube feeds were held 1/5: overnight pt requested tube feeds be held due to feeling full 1/10: Cortrak removed   Pt laying in bed at time of RD visit. Shared that she has not been eating well over the past two days. Encouraged pt PO intake to improve due to ongoing wounds and wound VAC. Does not like any oral supplements, continues to refuse Ensure's that are ordered. Pt agreeable to try the ProSource supplement.    Meal Intake  01/08-01/12: 0-90% x 8 meals (average 41%)  Discussed with RN. Sent results of calorie count to MD. Recommend SLP evaluation since foods that pt prefers are not on Dysphagia 3 diet.   Medications reviewed and include: Marinol, NovoLog SSI + 2 units, Semglee, Remeron, MVI, Protonix, Senokot-S, IV antibiotics  Labs reviewed: 24 hr CBGs 135-309  Diet Order:   Diet Order             DIET DYS 3 Room service appropriate? Yes with Assist; Fluid consistency: Thin  Diet effective now                   EDUCATION NEEDS:   Education needs have been addressed (  Encouraged adequate intake of calories and protein and discussed importance of adequate intake.)  Skin:  Skin Assessment: Skin Integrity Issues: Skin Integrity Issues:: Stage II, Incisions, Wound VAC, Other (Comment) Stage II: sacrum Wound Vac: bilateral groin surgical wounds Incisions: closed incision to leg Other: surgical wounds bilateral groin with wound VAC  Last BM:  1/16  Height:  Ht Readings from Last 1 Encounters:  08/04/22  5' (1.524 m)   Weight:  Wt Readings from Last 1 Encounters:  09/10/22 62.6 kg   BMI:  Body mass index is 26.95 kg/m.  Estimated Nutritional Needs:  Kcal:  1700-1900 Protein:  100-125 grams Fluid:  >1.7 L/day   Hermina Barters RD, LDN Clinical Dietitian See [facility] for contact information.

## 2022-09-10 NOTE — Progress Notes (Signed)
Occupational Therapy Treatment Patient Details Name: Heather [name] MRN: [id number] DOB: 07/27/1951 Today's Date: 09/10/2022   History of present illness Pt is a 72 y.o. female admitted 08/04/22 with headache, dizziness. 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  Found to have mottled LE's after given Narcdipine due to HTN; s/p emergent common femoral and iliofemoral endarterectomy and bilateral 4 compartment fasciotomies on 12/11. 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 PMH includes smoker(2ppd), severe AI/AS s/p TAVR, CAD s/p PCI and on DAPT, COPD, PAD, HTN.   OT comments  Patient supine in bed and agreeable to OT session.  Completes rolling in bed with min assist given increased time, cueing for sequencing.  Total assist for hygiene after BM.   Repositioned into chair position, engaged in limited exercises as below, before asking to rest. Downgraded goals today.  Will follow acutely.   Recommendations for follow up therapy are one component of a multi-disciplinary discharge planning process, led by the attending physician.  Recommendations may be updated based on patient status, additional functional criteria and insurance authorization.    Follow Up Recommendations  Skilled nursing-short term rehab (<3 hours/day)     Assistance Recommended at Discharge Frequent or constant Supervision/Assistance  Patient can return home with the following  Two people to help with walking and/or transfers;A lot of help with bathing/dressing/bathroom;Assistance with cooking/housework;Assistance with feeding;Direct supervision/assist for medications management;Direct supervision/assist for financial management;Assist for transportation;Help with stairs or ramp for entrance   Equipment Recommendations  Other (comment) (defer)    Recommendations for Other Services      Precautions / Restrictions Precautions Precautions: Fall;Other (comment) Precaution Comments:  bilateral groin wound vac; multiple buttocks wounds Restrictions RLE Weight Bearing: Weight bearing as tolerated LLE Weight Bearing: Non weight bearing       Mobility Bed Mobility Overal bed mobility: Needs Assistance Bed Mobility: Rolling Rolling: Min assist         General bed mobility comments: rolling L/R with cueing for sequencing. increased time required    Transfers                   General transfer comment: deferred     Balance                                           ADL either performed or assessed with clinical judgement   ADL Overall ADL's : Needs assistance/impaired     Grooming: Set up;Wash/dry face Grooming Details (indicate cue type and reason): chair postion in bed                   Toilet Transfer Details (indicate cue type and reason): declined Toileting- Clothing Manipulation and Hygiene: Total assistance;Bed level Toileting - Clothing Manipulation Details (indicate cue type and reason): +BM, total for hygiene sidelying     Functional mobility during ADLs: Minimal assistance General ADL Comments: limited to rolling in bed    Extremity/Trunk Assessment              Vision       Perception     Praxis      Cognition Arousal/Alertness: Awake/alert Behavior During Therapy: Flat affect Overall Cognitive Status: No family/caregiver present to determine baseline cognitive functioning Area of Impairment: Orientation, Following commands, Safety/judgement, Attention, Memory, Awareness, Problem solving  Orientation Level: Disoriented to, Time Current Attention Level: Selective Memory: Decreased short-term memory Following Commands: Follows one step commands consistently, Follows one step commands with increased time, Follows multi-step commands inconsistently Safety/Judgement: Decreased awareness of safety, Decreased awareness of deficits Awareness: Emergent Problem Solving: Slow  processing, Requires verbal cues, Difficulty sequencing General Comments: poor insight to deficits, difficulty with initation and sequencing with poor carryover of tasks during session.        Exercises Exercises: General Upper Extremity General Exercises - Upper Extremity Shoulder Flexion: AROM, Both, 10 reps, Supine Other Exercises Other Exercises: attempted pulling trunk forward using BUEs, achieving 25% and pt layed back down due to back pain    Shoulder Instructions       General Comments encouraged pt to participate in EOB activity, but pt reports "i just need to take a break"    Pertinent Vitals/ Pain       Pain Assessment Pain Assessment: 0-10 Pain Score: 8  Pain Location: back Pain Descriptors / Indicators: Grimacing, Guarding, Discomfort Pain Intervention(s): Limited activity within patient's tolerance, Monitored during session, Repositioned  Home Living                                          Prior Functioning/Environment              Frequency  Min 2X/week        Progress Toward Goals  OT Goals(current goals can now be found in the care plan section)  Progress towards OT goals: Goals drowngraded-see care plan  Acute Rehab OT Goals Patient Stated Goal: less pain OT Goal Formulation: With patient Time For Goal Achievement: 09/24/22 Potential to Achieve Goals: Good  Plan Discharge plan remains appropriate;Frequency remains appropriate    Co-evaluation                 AM-PAC OT "6 Clicks" Daily Activity     Outcome Measure   Help from another person eating meals?: A Little Help from another person taking care of personal grooming?: A Little Help from another person toileting, which includes using toliet, bedpan, or urinal?: Total Help from another person bathing (including washing, rinsing, drying)?: A Lot Help from another person to put on and taking off regular upper body clothing?: A Little Help from another person  to put on and taking off regular lower body clothing?: Total 6 Click Score: 13    End of Session    OT Visit Diagnosis: Unsteadiness on feet (R26.81);Other abnormalities of gait and mobility (R26.89);Muscle weakness (generalized) (M62.81);Pain Pain - part of body:  (back)   Activity Tolerance Patient limited by pain;Patient limited by fatigue   Patient Left in bed;with call bell/phone within reach   Nurse Communication Mobility status        Time: 1100-1127 OT Time Calculation (min): 27 min  Charges: OT General Charges $OT Visit: 1 Visit OT Treatments $Self Care/Home Management : 23-37 mins  [place] Office [phone]   Delight Stare 09/10/2022, 12:29 PM

## 2022-09-10 NOTE — Inpatient Diabetes Management (Signed)
Inpatient Diabetes Program Recommendations  AACE/ADA: New Consensus Statement on Inpatient Glycemic Control (2015)  Target Ranges:  Prepandial:   less than 140 mg/dL      Peak postprandial:   less than 180 mg/dL (1-2 hours)      Critically ill patients:  140 - 180 mg/dL   Lab Results  Component Value Date   GLUCAP 160 (H) 09/10/2022   HGBA1C 12.9 (H) 08/05/2022    Review of Glycemic Control  Latest Reference Range & Units 09/09/22 16:21 09/09/22 20:14 09/10/22 00:02 09/10/22 04:01 09/10/22 07:47  Glucose-Capillary 70 - 99 mg/dL 309 (H) 274 (H) 269 (H) 216 (H) 160 (H)  (H): Data is abnormally high Diabetes history: DM2 Outpatient Diabetes medications: Levemir 18 units QD, Metformin 750 mg QD, Novolog 5 units TID Current orders for Inpatient glycemic control: Semglee 3 units QD, Novolog 0-6 units TID, Novolog 2 units TID   Inpatient Diabetes Program Recommendations:    Patient not receiving Novolog 2 TID due to poor oral intake documented.   Slightly elevated yesterday following insulin adjustments: Semglee & correction were both decreased.   Consider increasing Semglee to 5 units QD.   Thanks, Bronson Curb, MSN, RNC-OB Diabetes Coordinator [phone] (8a-5p)

## 2022-09-10 NOTE — Consult Note (Signed)
[place] Nurse wound follow up;  Patient premedicated with pain medication prior to the procedure and tolerated with minimal amt discomfort Wound type: bilateral groin wounds full thickness post-op wounds, connected to NPWT with Y connector   Wound bed: Left groin 4 cms x 5 cms x 2.2 cms  90% Beefy red , 10% white fibrinous tissue present.  Right groin 4 cms x 5 cms x 2.1 cms 100% beefy red tissue present.   White foam to base of both wounds, top with black foam, applied barrier ring to wound edges to attempt to maintain a seal since they are located in creases.   Drainage (amount, consistency, odor) minimal bleeding, small amt yellow serous Periwound: intact  skin surrounding Dressing procedure/placement/frequency: 1 piece white and 1 piece black foam removed, wound cleansed and 1 piece white and 1 piece black replaced into each wound. Covered with drape and TRAC pad.  Seal achieved at 125 mmHg continuous suction.  WOC will continue to follow d/t complexity of dressing change, next NPWT change Friday.   Thank you,    Adrionna Delcid MSN, RN-BC, [employer]

## 2022-09-11 DIAGNOSIS — I739 Peripheral vascular disease, unspecified: Secondary | ICD-10-CM

## 2022-09-11 DIAGNOSIS — I614 Nontraumatic intracerebral hemorrhage in cerebellum: Secondary | ICD-10-CM | POA: Diagnosis not present

## 2022-09-11 LAB — GLUCOSE, CAPILLARY
Glucose-Capillary: 217 mg/dL — ABNORMAL HIGH (ref 70–99)
Glucose-Capillary: 158 mg/dL — ABNORMAL HIGH (ref 70–99)
Glucose-Capillary: 158 mg/dL — ABNORMAL HIGH (ref 70–99)
Glucose-Capillary: 120 mg/dL — ABNORMAL HIGH (ref 70–99)

## 2022-09-11 NOTE — Progress Notes (Addendum)
Foley catheter removed per MD order without difficulty.  Peri-care performed and Purewick placed. Patient DTV by 9pm.

## 2022-09-11 NOTE — Progress Notes (Signed)
PROGRESS NOTE Heather [name]  XTG:[id number] DOB: Apr 07, 1951 DOA: 08/04/2022 PCP: [name], FNP   Brief Narrative/Hospital Course: 72 y.o. female with PMH significant for DM2, neuropathy, HTN, HLD, COPD, GERD, PAD, AS s/p TAVR, chronic diastolic CHF, anxiety/depression/schizophrenia, tobacco abuse who presented to the emergency room on 27/03 with systolic blood pressure in the 200s and symptomatology including dizziness, headache and blurred vision.  Patient was admitted to ICU for hemorrhagic stroke and bilateral lower extremity acute on chronic ischemia.  Vascular surgery was consulted and s/p bilateral lower extremity femoral endarterectomy and patch angioplasty with iliac stenting, 4 compartment fasciotomies for acute on chronic lower extremity ischemia. Postop remained on vent and Cleviprex.eventually, able to be extubated and weaned off pressor support and transferred to hospitalist service on 12/17.  Eventually able to be weaned off of transferred to floor and [place] on 12/17.  Hospital course since then noteworthy for episodes of hypoglycemia with insulin adjustments.  Initially required tube feedings but as oral intake improved, able to be discontinued.  Patient remains weak deconditioned unable to mobilize, needing wound care with [facility] change Monday Wednesday Friday.  Insurance has denied LTACH despite peer-to-peer review and approved for skilled nursing facility which family has agreed.  Subjective: Patient seen/examined. Alert awake resting comfortably has no complaints.  Afebrile overnight Blood sugar has been holding 200 today Reports she is eating  Assessment and Plan: Principal Problem:   ICH (intracerebral hemorrhage) (HCC) Active Problems:   Abscess of anal and rectal regions   Decubitus ulcer of sacral region, stage 2 [facility])   Pulmonary edema   Right groin wound   Wound of left groin  Rhabdomyolysis Left AKA and excisional debridement of bilateral groin wounds with VAC  placement 08/15/23 Bilateral CFA endarterectomy with bovine patch angioplasty Bilateral iliofemoral embolectomy, stenting Bilateral CIA, stent Rt EIA and 4 compartment fasciotomies: Followed by vascular surger.Patient with baseline Doppler flow right DP. S/P procedures as above.  Bilateral groin appears to be healing well on wound VAC  dressing change MWF as per vascular.   Continue Plavix/statin. Continue Ancef and Flagyl x 6 wk from 08/14/22 for E. coli and Proteus [name] in wound culture from OR: This was discussed with Dr. West Bali from [place] along with vascular team Dr Donzetta Matters and Dr Carlis Abbott. She has a left arm midline in place.   Acute ICH w/ acute intraparenchymal hematoma in cerebellar vermis measuring 11 cc in volume and mild mass effect upon the fourth ventricle:Confirmed with an MRI. Seen by neurology.  Eventually placed on antiplatelet agents tolerating Plavix 75.  Neurologically stable  Acute respiratory failure with hypoxia COPD/Smoker : Extubated 12/14> On room air currently.Continue current [place] Pulmicort,Yupleri and albuterol as needed.   Chronic diastolic CHF HTN: BP is stable.  Appears euvolemic.Echo EF 60 to 60% on 12/11 w/  indeterminate diastolic parameters.  Negative balance as below monitor volume status.  Continue Coreg, prn Hydralazine. Net IO Since Admission: -1,442.78 mL [09/11/22 1041] Intake/Output Summary (Last 24 hours) at 09/11/2022 1041 Last data filed at 09/11/2022 0415 Gross per 24 hour  Intake 240 ml  Output 850 ml  Net -610 ml   Type 2 diabetes mellitus with uncontrolled hyperglycemia and episode of hypoglycemia 1/15 night:A1c poorly controlled 12.9, she has brittle diabetes.  Diabetes coordinator consulted.  Continue Semglee 5 units, SSI and 2 u premeal insulin.  Monitor and adjust Recent Labs  Lab 09/10/22 1150 09/10/22 1655 09/10/22 2001 09/10/22 2356 09/11/22 0612  GLUCAP 135* 151* 240* 255* 217*  Hx of CAD HLD AS s/p TAVR: Stable,  no chest pain.Continue Plavix Lipitor.     Acute blood loss anemia in the setting of surgery: s/p 3 units PRBC so far. Hb stable Recent Labs  Lab 09/05/22 0644 09/06/22 0617 09/08/22 0058  HGB 11.9* 9.4* 9.8*  HCT 37.2 29.2* 30.6*   Depression, bipolar, schizophrenia, insomnia: mood has been stable.Continue on Lexapro daily,Remeron nightly, trazodone nightly.,    Dysphagia:Likely related to [place].core track has been removed.  Tolerating p.o. well augment diet, she does not want to have NG tube or tube feeding. She has had very poor oral intake>was discussed with her in detail she does not want any kind of feeding tube palliative care following.she reports she has been eating well now.   Family encouraged for oral nutrition  or else she will not improve and will not heal  Dietitian has started 48-hour calorie count 1/16 Speech reeval completed 1/18 changing to regular diet> with this hopefully her oral intake will continue to improve  Constipation improved with stool softener  Pressure injury stage II sacrum POA see below: Pressure Injury 08/04/22 Sacrum Stage 2 -  Partial thickness loss of dermis presenting as a shallow open injury with a red, pink wound bed without slough. (Active)  08/04/22 0100  Location: Sacrum  Location Orientation:   Staging: Stage 2 -  Partial thickness loss of dermis presenting as a shallow open injury with a red, pink wound bed without slough.  Wound Description (Comments):   Present on Admission: Yes  Dressing Type Foam - Lift dressing to assess site every shift 08/31/22 1950   DVT prophylaxis: heparin injection 5,000 Units Start: 08/10/22 1600 Code Status:   Code Status: Full Code Family Communication: plan of care discussed with patient at bedside. Discussed with palliative care team today.  Patient status is: inpatient  because of poor po intake Level of care: Telemetry Medical  Dispo: The patient is from: Home, w/ Husband.Daughters are involved in  care patient prefers her husband to make decision            Anticipated disposition: Biochemist, clinical for skilled nursing SNF pending. LTACH denied  Objective: Vitals last 24 hrs: Vitals:   09/11/22 0413 09/11/22 0645 09/11/22 0745 09/11/22 0811  BP: (!) 188/66 (!) 115/49  (!) 144/54  Pulse: 62   69  Resp: '16 16  18  '$ Temp: 97.9 F (36.6 C)   98.2 F (36.8 C)  TempSrc: Oral   Oral  SpO2: 98%  96% 95%  Weight:      Height:       Weight change:   Physical Examination:  General exam: AAox3, weak,older appearing HEENT:Oral mucosa moist, Ear/Nose WNL grossly, dentition normal. Respiratory system: bilaterally clear BS,no use of accessory muscle Cardiovascular system: S1 & S2 +, regular rate. Gastrointestinal system: Abdomen soft,NT,ND,BS+ Nervous System:Alert, awake, moving extremities and grossly nonfocal Extremities: Left BKA surgical site with intact stable, right leg incision site is healed.  Bilateral groin with wound VAC in place, Foley catheter in place  Skin: No rashes,no icterus. UVO:ZDGUYQ muscle bulk,tone, power   Medications reviewed:  Scheduled Meds:  (feeding supplement) PROSource Plus  30 mL Oral BID BM   arformoterol  15 mcg Nebulization BID   atorvastatin  80 mg Oral Daily   budesonide (PULMICORT) nebulizer solution  0.5 mg Nebulization BID   carvedilol  12.5 mg Oral BID WC   Chlorhexidine Gluconate Cloth  6 each Topical Q0600   clopidogrel  75 mg  Oral Daily   dronabinol  2.5 mg Oral BID AC   escitalopram  10 mg Oral Daily   Gerhardt's butt cream   Topical BID   heparin injection (subcutaneous)  5,000 Units Subcutaneous Q8H   insulin aspart  0-6 Units Subcutaneous TID WC   insulin aspart  2 Units Subcutaneous TID WC   insulin glargine-yfgn  5 Units Subcutaneous Daily   metroNIDAZOLE  500 mg Oral Q12H   mirtazapine  7.5 mg Oral QHS   multivitamin with minerals  1 tablet Oral Daily   nicotine  14 mg Transdermal Daily   mouth rinse  15 mL Mouth Rinse Q4H    oxyCODONE  10 mg Oral Q12H   pantoprazole  40 mg Oral Daily   revefenacin  175 mcg Nebulization Daily   senna-docusate  1 tablet Oral QHS   sodium chloride flush  10-40 mL Intracatheter Q12H   sodium chloride flush  10-40 mL Intracatheter Q12H  Continuous Infusions:   ceFAZolin (ANCEF) IV 2 g (09/11/22 0411)   Diet Order             Diet regular Room service appropriate? Yes with Assist; Fluid consistency: Thin  Diet effective now                  Intake/Output Summary (Last 24 hours) at 09/11/2022 1041 Last data filed at 09/11/2022 0415 Gross per 24 hour  Intake 240 ml  Output 850 ml  Net -610 ml    Net IO Since Admission: -1,442.78 mL [09/11/22 1041]  Wt Readings from Last 3 Encounters:  09/10/22 62.6 kg  08/03/22 58.7 kg  07/01/22 54.4 kg     Unresulted Labs (From admission, onward)    None     Data Reviewed:  Hemoglobin improving at 9.8, basic metabolic panel normal  Antimicrobials: Anti-infectives (From admission, onward)    Start     Dose/Rate Route Frequency Ordered Stop   09/09/22 1100  metroNIDAZOLE (FLAGYL) tablet 500 mg        500 mg Oral Every 12 hours 09/09/22 1002     08/20/22 2200  metroNIDAZOLE (FLAGYL) IVPB 500 mg  Status:  Discontinued        500 mg 100 mL/hr over 60 Minutes Intravenous Every 12 hours 08/20/22 1110 08/20/22 1111   08/20/22 2200  metroNIDAZOLE (FLAGYL) tablet 500 mg  Status:  Discontinued        500 mg Per Tube Every 12 hours 08/20/22 1111 09/09/22 1002   08/18/22 2300  ceFAZolin (ANCEF) IVPB 2g/100 mL premix        2 g 200 mL/hr over 30 Minutes Intravenous Every 8 hours 08/18/22 1455     08/18/22 2300  metroNIDAZOLE (FLAGYL) IVPB 500 mg  Status:  Discontinued        500 mg 100 mL/hr over 60 Minutes Intravenous Every 6 hours 08/18/22 1455 08/20/22 1110   08/15/22 1100  vancomycin (VANCOCIN) IVPB 1000 mg/200 mL premix  Status:  Discontinued        1,000 mg 200 mL/hr over 60 Minutes Intravenous Every 24 hours 08/14/22 1006  08/15/22 1307   08/14/22 1400  piperacillin-tazobactam (ZOSYN) IVPB 3.375 g  Status:  Discontinued        3.375 g 12.5 mL/hr over 240 Minutes Intravenous Every 8 hours 08/14/22 1006 08/18/22 1455   08/14/22 1100  vancomycin (VANCOREADY) IVPB 1500 mg/300 mL        1,500 mg 150 mL/hr over 120 Minutes Intravenous  Once 08/14/22 1006  08/14/22 1502   08/04/22 1045  ceFAZolin (ANCEF) IVPB 2g/100 mL premix        2 g 200 mL/hr over 30 Minutes Intravenous On call to O.R. 08/04/22 0958 08/04/22 1100     Culture/Microbiology    Component Value Date/Time   SDES WOUND 08/14/2022 0836   SPECREQUEST GROIN 08/14/2022 0836   CULT  08/14/2022 0836    MODERATE ESCHERICHIA COLI RARE PROTEUS MIRABILIS MODERATE BACTEROIDES OVATUS MODERATE BACTEROIDES STERCORIS BETA LACTAMASE POSITIVE Performed at [facility], [place] [address] [place], [place] [address]    REPTSTATUS 08/17/2022 FINAL 08/14/2022 6314    Radiology Studies: No results found.   LOS: 85 days   Antonieta Pert, MD [facility]  09/11/2022, 10:41 AM

## 2022-09-11 NOTE — Progress Notes (Signed)
Mobility Specialist Progress Note:   09/11/22 1350  Mobility  Activity Refused mobility   Pt currently getting dressing change. Will follow-up as time allows.   Gareth Eagle Torrance Stockley Mobility Specialist Please contact via [employer] or  Rehab Office at [phone]

## 2022-09-11 NOTE — Consult Note (Addendum)
WOC has recommended DC of NPWT to the bilateral groin.  Pack bilateral groin with silver hydrofiber to absorb exudate. Change daily. Discussed with VVS PA Laurence Slate, ok to DC will follow up with VVS staff and bedside staff in the am.  Right groin has more serous/lymphatic  drainage and NPWT may be helpful to control if this is excessive when therapy DCed.  Bedside nurse aware of new orders.   [place], [place], [place]

## 2022-09-11 NOTE — TOC Progression Note (Signed)
Transition of Care [facility]) - Progression Note    Patient Details  Name: Enrica Corliss MRN: [id number] Date of Birth: 26-Apr-1951  Transition of Care [facility]) CM/SW [place], [place] Phone Number: 09/11/2022, 1:48 PM  Clinical Narrative:     Provided  daughter,[name] with bed offers. She will review and inform CSW of choice. She reports has been dealing with family medical emergency and she will do her best to provide choice.  Thurmond Butts, MSW, LCSW Clinical Social Worker    Expected Discharge Plan: Skilled Nursing Facility Barriers to Discharge: [place] (PASRR), Insurance Authorization, Continued Medical Work up, SNF Pending bed offer (cortrak)  Expected Discharge Plan and Services In-house Referral: Clinical Social Work   Post Acute Care Choice: Long Term Acute Care (LTAC) Living arrangements for the past 2 months: [place] Determinants of Health (SDOH) Interventions [place]: Food Insecurity Present (08/11/2022)  Housing: Low Risk  (08/11/2022)  Transportation Needs: No Transportation Needs (08/11/2022)  Utilities: Not At Risk (08/11/2022)  Financial Resource Strain: Medium Risk (10/19/2018)  Physical Activity: Inactive (10/19/2018)  Social Connections: Unknown (10/19/2018)  Tobacco Use: High Risk (08/15/2022)    Readmission Risk Interventions    11/13/2020    2:27 PM  Readmission Risk Prevention Plan  Transportation Screening Complete  PCP or Specialist Appt within 3-5 Days Complete  HRI or [place] Complete  Social Work Consult for [place] Planning/Counseling Complete  Palliative Care Screening Not Applicable  Medication Review Press photographer) Complete

## 2022-09-11 NOTE — Progress Notes (Signed)
Physical Therapy Treatment Patient Details Name: Heather [name] MRN: [id number] DOB: Oct 09, 1950 Today's Date: 09/11/2022   History of Present Illness Pt is a 72 y.o. female admitted 08/04/22 with headache, dizziness. 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  Found to have mottled LE's after given Narcdipine due to HTN; s/p emergent common femoral and iliofemoral endarterectomy and bilateral 4 compartment fasciotomies on 12/11. 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 PMH includes smoker(2ppd), severe AI/AS s/p TAVR, CAD s/p PCI and on DAPT, COPD, PAD, HTN.    PT Comments    Pt making slow progress. Better tolerance today of attempts to stand although she wasn't able to achieve that. Pt declined transfer to chair with maximove. Continue to work on bed mobility, balance and transfers. Continue to recommend SNF for further rehab.    Recommendations for follow up therapy are one component of a multi-disciplinary discharge planning process, led by the attending physician.  Recommendations may be updated based on patient status, additional functional criteria and insurance authorization.  Follow Up Recommendations  Skilled nursing-short term rehab (<3 hours/day) Can patient physically be transported by private vehicle: No   Assistance Recommended at Discharge Frequent or constant Supervision/Assistance  Patient can return home with the following Two people to help with walking and/or transfers;Two people to help with bathing/dressing/bathroom;Help with stairs or ramp for entrance;Assist for transportation;Assistance with cooking/housework   Equipment Recommendations  Other (comment) (to be determined)    Recommendations for Other Services       Precautions / Restrictions Precautions Precautions: Fall;Other (comment) Precaution Comments: multiple buttocks wounds     Mobility  Bed Mobility Overal bed mobility: Needs Assistance Bed Mobility:  Supine to Sit, Sit to Supine     Supine to sit: Mod assist, HOB elevated, +2 for physical assistance Sit to supine: +2 for physical assistance, Max assist   General bed mobility comments: Assist to bring leg off bed, elevate trunk into sitting and bring hips to EOB. Assist to lower trunk and bring leg back up into bed    Transfers Overall transfer level: Needs assistance Equipment used: Ambulation equipment used               General transfer comment: Attempted to stand from bed with St[place]. With +2 max assist only able to bring buttocks off of bed ~2-4 inches.    Ambulation/Gait                   Stairs             Wheelchair Mobility    Modified Rankin (Stroke Patients Only)       Balance Overall balance assessment: Needs assistance Sitting-balance support: Bilateral upper extremity supported, Feet unsupported, Single extremity supported, Feet supported Sitting balance-Leahy Scale: Poor Sitting balance - Comments: Pt able to sit EOB with min guard with UE support. Needs assist for dynamic activities Postural control: Posterior lean                                  Cognition Arousal/Alertness: Awake/alert Behavior During Therapy: Flat affect Overall Cognitive Status: No family/caregiver present to determine baseline cognitive functioning Area of Impairment: Orientation, Following commands, Safety/judgement, Attention, Memory, Awareness, Problem solving                 Orientation Level: Time Current Attention Level: Selective Memory: Decreased short-term memory Following  Commands: Follows one step commands consistently, Follows one step commands with increased time, Follows multi-step commands inconsistently Safety/Judgement: Decreased awareness of safety, Decreased awareness of deficits Awareness: Emergent Problem Solving: Slow processing, Requires verbal cues, Difficulty sequencing          Exercises General Exercises -  Lower Extremity Long Arc Quad: AROM, Right, Seated, 10 reps    General Comments General comments (skin integrity, edema, etc.): VSS      Pertinent Vitals/Pain Pain Assessment Pain Assessment: Faces Faces Pain Scale: No hurt Pain Intervention(s): Premedicated before session    Home Living                          Prior Function            PT Goals (current goals can now be found in the care plan section) Acute Rehab PT Goals Patient Stated Goal: not stated Progress towards PT goals: Progressing toward goals    Frequency    Min 2X/week      PT Plan Current plan remains appropriate    Co-evaluation              AM-PAC PT "6 Clicks" Mobility   Outcome Measure  Help needed turning from your back to your side while in a flat bed without using bedrails?: A Lot Help needed moving from lying on your back to sitting on the side of a flat bed without using bedrails?: Total Help needed moving to and from a bed to a chair (including a wheelchair)?: Total Help needed standing up from a chair using your arms (e.g., wheelchair or bedside chair)?: Total Help needed to walk in hospital room?: Total Help needed climbing 3-5 steps with a railing? : Total 6 Click Score: 7    End of Session Equipment Utilized During Treatment: Gait belt Activity Tolerance: Patient tolerated treatment well Patient left: in bed;with call bell/phone within reach Nurse Communication: Mobility status;Need for lift equipment PT Visit Diagnosis: Muscle weakness (generalized) (M62.81);Other abnormalities of gait and mobility (R26.89)     Time: 1031-1050 PT Time Calculation (min) (ACUTE ONLY): 19 min  Charges:  $Therapeutic Activity: 8-22 mins                     [place] [facility] [place] 09/11/2022, 2:34 PM

## 2022-09-11 NOTE — Progress Notes (Signed)
Wound vac removed as ordered. Aquacel dressing applied. Next dressing change due 09/12/2022. Alandria Butkiewicz, Bettina Gavia Rn

## 2022-09-11 NOTE — Progress Notes (Signed)
Speech Language Pathology Treatment: Dysphagia  Patient Details Name: Heather [name] MRN: [id number] DOB: Mar 09, 1951 Today's Date: 09/11/2022 Time: 0945-1000 SLP Time Calculation (min) (ACUTE ONLY): 15 min  Assessment / Plan / Recommendation Clinical Impression  Pt upgraded to regular textures, Had some toast on her breakfast tray. Said it was hard to chew because its dry and nasty. She is able to masticate grapes. Tried to problem solve adding moisture to foods - like sauce or jelly. Also attempted to choose some foods from the menu she might like. She wants a hot dog, but this it not a choice. We tried to order potato soup and grilled cheese but service response call dropped x4. She also likes biscuits and gravy and bacon which would be good for tomorrows breakfast. Her order is set for room service with assist. Unsure if staff is assisting her to make her own food choices. Expect that despite unrestricted diet pts intake will remain poor. No SLP f/u at this time.   HPI HPI: 72 yo female smoker presented to ER with sudden onset of dizziness, blurred vision, headache, nausea and vomiting.  BP in EMS 209/76.  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  Also found to have bilateral leg pain with numbness and coolness.  Found to have occlusion of b/l common iliac artery stents, Lt SFA and popliteal stent and vascular surgery consulted.  Taken to OR for b/l common femoral endarterectomy, iliofemoral endarterectomy, lower extremity embolectomy and stent of b/l common iliac arteries and Rt external iliac artery, and b/l 4 compartment fasciotomies.  Remained on vent and cleviprex post op. 12/11-12/14.      SLP Plan  All goals met      Recommendations for follow up therapy are one component of a multi-disciplinary discharge planning process, led by the attending physician.  Recommendations may be updated based on patient status, additional functional criteria and insurance  authorization.    Recommendations  Diet recommendations: Regular;Thin liquid Liquids provided via: Cup;Straw Medication Administration: Whole meds with liquid Supervision: Patient able to self feed Postural Changes and/or Swallow Maneuvers: Seated upright 90 degrees                Follow Up Recommendations: Skilled nursing-short term rehab (<3 hours/day) Plan: All goals met           Tyce Delcid, [name]  09/11/2022, 10:05 AM

## 2022-09-11 NOTE — Progress Notes (Signed)
  Progress Note    09/11/2022 12:09 PM 28 Days Post-Op  Subjective:  feeling much better  Vitals:   09/11/22 0811 09/11/22 1104  BP: (!) 144/54 (!) 126/57  Pulse: 69 62  Resp: 18 17  Temp: 98.2 F (36.8 C) 98.8 F (37.1 C)  SpO2: 95% 95%    Physical Exam: Aaox3 Bilateral groin vacs to suction Right foot is warm and well-perfused and staples are out on the medial and lateral leg Left AKA site with staples intact with mild skin irritation   CBC    Component Value Date/Time   WBC 5.5 09/08/2022 0058   RBC 3.34 (L) 09/08/2022 0058   HGB 9.8 (L) 09/08/2022 0058   HGB 14.3 06/20/2012 2127   HCT 30.6 (L) 09/08/2022 0058   HCT 42.9 06/20/2012 2127   PLT 377 09/08/2022 0058   PLT 357 06/20/2012 2127   MCV 91.6 09/08/2022 0058   MCV 91 06/20/2012 2127   MCH 29.3 09/08/2022 0058   MCHC 32.0 09/08/2022 0058   RDW 15.6 (H) 09/08/2022 0058   RDW 13.6 06/20/2012 2127   LYMPHSABS 1.3 08/31/2022 0103   MONOABS 0.9 08/31/2022 0103   EOSABS 0.3 08/31/2022 0103   BASOSABS 0.1 08/31/2022 0103    BMET    Component Value Date/Time   NA 135 09/08/2022 0058   NA 135 (L) 06/20/2012 2127   K 3.5 09/08/2022 0058   K 4.0 06/20/2012 2127   CL 100 09/08/2022 0058   CL 100 06/20/2012 2127   CO2 25 09/08/2022 0058   CO2 27 06/20/2012 2127   GLUCOSE 91 09/08/2022 0058   GLUCOSE 250 (H) 06/20/2012 2127   BUN 9 09/08/2022 0058   BUN 20 (H) 06/20/2012 2127   CREATININE 0.81 09/08/2022 0058   CREATININE 0.60 06/20/2012 2127   CALCIUM 8.2 (L) 09/08/2022 0058   CALCIUM 8.8 06/20/2012 2127   GFRNONAA >60 09/08/2022 0058   GFRNONAA >60 06/20/2012 2127   GFRAA >60 06/20/2012 2127    INR    Component Value Date/Time   INR 1.0 08/03/2022 2156     Intake/Output Summary (Last 24 hours) at 09/11/2022 1209 Last data filed at 09/11/2022 0415 Gross per 24 hour  Intake 240 ml  Output 850 ml  Net -610 ml     Assessment/plan:  72 y.o. female is bilateral lower extremity  revascularization now status post left above-knee amputation and debridement of both groins.  Onward nurse has recommended no further wound vacs and will remove these in the morning and placed wet-to-dry.  We can remove left AKA staples prior to discharge.  Plan will be for total of 6 weeks of IV Ancef and p.o. Flagyl.     Hadasah Brugger C. Donzetta Matters, MD [facility] [place] Office: [phone] Pager: [phone]  09/11/2022 12:09 PM

## 2022-09-11 NOTE — Progress Notes (Signed)
This chaplain is present with the Pt. for F/U spiritual care and discerning the Pt. HCPOA.  The Pt. is awake and speaks with clarity in her story telling. [place] is the Pt. home and she shares the area has about 10" of snow now. The chaplain understands the Pt. trusts her daughters and talks to both of them about twice a day. The Pt. remains firm in her decision to leave her husband-"Shorty" as the surrogate decision maker even though the Pt. is accepting of the medical teams discussions with her daughters. The chaplain understands the Pt. goal is returning home to her husband, dog, and cat after her legs get stronger.  This chaplain is available for F/U spiritual care as needed.  Chaplain Sallyanne Kuster [phone]

## 2022-09-12 DIAGNOSIS — I739 Peripheral vascular disease, unspecified: Secondary | ICD-10-CM

## 2022-09-12 LAB — GLUCOSE, CAPILLARY
Glucose-Capillary: 127 mg/dL — ABNORMAL HIGH (ref 70–99)
Glucose-Capillary: 206 mg/dL — ABNORMAL HIGH (ref 70–99)
Glucose-Capillary: 185 mg/dL — ABNORMAL HIGH (ref 70–99)
Glucose-Capillary: 153 mg/dL — ABNORMAL HIGH (ref 70–99)

## 2022-09-12 MED ORDER — DAKINS (1/4 STRENGTH) 0.125 % EX SOLN
Freq: Every day | CUTANEOUS | Status: DC
  Filled 2022-09-12: qty 473

## 2022-09-12 MED ORDER — NON FORMULARY: Freq: Every day | Status: DC

## 2022-09-12 NOTE — Consult Note (Signed)
[facility] nursing team will sign off. VVS manage groin wounds topically.   Discussed POC with patient and bedside nurse.  Re consult if needed, will not follow at this time. Thanks  Andrell Bergeson [employer], RN,CWOCN, CNS, [place] [phone])

## 2022-09-12 NOTE — Progress Notes (Signed)
Right and left groin dressing changes performed per MD order without difficulty.

## 2022-09-12 NOTE — Progress Notes (Addendum)
  Progress Note    09/12/2022 7:46 AM 29 Days Post-Op  Subjective:  sleeping    Vitals:   09/11/22 2352 09/12/22 0432  BP: (!) 153/68 (!) 144/60  Pulse: 65 66  Resp: 13 13  Temp: 98.4 F (36.9 C) 98.4 F (36.9 C)  SpO2: 98% 98%    Physical Exam: Lungs:  nonlabored Incisions:  L AKA with staples and intact. RLE incisions nearly healed. Bilateral groins with WTD dressing. R groin with serosanginous drainage and healthy tissue. L groin with mostly healthy appearing tissue and mild amount of green drainage Extremities:  Brisk DP signal in RLE       CBC    Component Value Date/Time   WBC 5.5 09/08/2022 0058   RBC 3.34 (L) 09/08/2022 0058   HGB 9.8 (L) 09/08/2022 0058   HGB 14.3 06/20/2012 2127   HCT 30.6 (L) 09/08/2022 0058   HCT 42.9 06/20/2012 2127   PLT 377 09/08/2022 0058   PLT 357 06/20/2012 2127   MCV 91.6 09/08/2022 0058   MCV 91 06/20/2012 2127   MCH 29.3 09/08/2022 0058   MCHC 32.0 09/08/2022 0058   RDW 15.6 (H) 09/08/2022 0058   RDW 13.6 06/20/2012 2127   LYMPHSABS 1.3 08/31/2022 0103   MONOABS 0.9 08/31/2022 0103   EOSABS 0.3 08/31/2022 0103   BASOSABS 0.1 08/31/2022 0103    BMET    Component Value Date/Time   NA 135 09/08/2022 0058   NA 135 (L) 06/20/2012 2127   K 3.5 09/08/2022 0058   K 4.0 06/20/2012 2127   CL 100 09/08/2022 0058   CL 100 06/20/2012 2127   CO2 25 09/08/2022 0058   CO2 27 06/20/2012 2127   GLUCOSE 91 09/08/2022 0058   GLUCOSE 250 (H) 06/20/2012 2127   BUN 9 09/08/2022 0058   BUN 20 (H) 06/20/2012 2127   CREATININE 0.81 09/08/2022 0058   CREATININE 0.60 06/20/2012 2127   CALCIUM 8.2 (L) 09/08/2022 0058   CALCIUM 8.8 06/20/2012 2127   GFRNONAA >60 09/08/2022 0058   GFRNONAA >60 06/20/2012 2127   GFRAA >60 06/20/2012 2127    INR    Component Value Date/Time   INR 1.0 08/03/2022 2156     Intake/Output Summary (Last 24 hours) at 09/12/2022 0746 Last data filed at 09/12/2022 0316 Gross per 24 hour  Intake 360 ml   Output 650 ml  Net -290 ml      Assessment/Plan:  72 y.o. female is 29 days post op, s/p:  bilateral lower extremity revascularization with groin debridement and L AKA   -Bilateral groins with mostly healthy appearing tissue, now converted to WTD dressings. R groin with serosanginous drainage, L groin with green drainage concerning for Pseudomonas -L AKA with staples, will remove on 1/21 -Nursing to do daily WTD dressings with silver to bilateral groins. Topical Dakins in the left groin -Continue 6 wk course of Ancef and Flagyl. Confirmed with ID that topical Dakins would help cover for Pseudo in the left groin  Vicente Serene, PA-C [facility] [phone] 09/12/2022 7:46 AM   I have independently interviewed and examined patient and agree with PA assessment and plan above. Staples out prior to dc. Groins healing well, will add dakins to cover pseudomonas in left groin.  Nayleen Janosik C. Donzetta Matters, MD [facility] [place] Office: [phone] Pager: [phone]

## 2022-09-12 NOTE — Progress Notes (Signed)
PROGRESS NOTE Delona Clasby  ZYS:[id number] DOB: [birth year] DOA: 08/04/2022 PCP: [name], FNP   Brief Narrative/Hospital Course: 72 y.o. female with PMH significant for DM2, neuropathy, HTN, HLD, COPD, GERD, PAD, AS s/p TAVR, chronic diastolic CHF, anxiety/depression/schizophrenia, tobacco abuse who presented to the emergency room on 93/23 with systolic blood pressure in the 200s and symptomatology including dizziness, headache and blurred vision.  Patient was admitted to ICU for hemorrhagic stroke and bilateral lower extremity acute on chronic ischemia.  Vascular surgery was consulted and s/p bilateral lower extremity femoral endarterectomy and patch angioplasty with iliac stenting, 4 compartment fasciotomies for acute on chronic lower extremity ischemia. Postop remained on vent and Cleviprex.eventually, able to be extubated and weaned off pressor support and transferred to hospitalist service on 12/17.  Eventually able to be weaned off of transferred to floor and [place] on 12/17.  Hospital course since then noteworthy for episodes of hypoglycemia with insulin adjustments.  Initially required tube feedings but as oral intake improved, able to be discontinued.  Patient remains weak deconditioned unable to mobilize, needing wound care with [facility] change Monday Wednesday Friday.  Insurance has denied LTACH despite peer-to-peer review and approved for skilled nursing facility which family has agreed.  Subjective: Seen and examined this morning. Overnight patient Tmax 99.1, BP stable Blood sugar well-controlled in 120s to 150s.  Assessment and Plan: Principal Problem:   PVD (peripheral vascular disease) ([name]) Active Problems:   ICH (intracerebral hemorrhage) ([place])   Abscess of anal and rectal regions   Decubitus ulcer of sacral region, stage 2 [facility])   Pulmonary edema   Right groin wound   Wound of left groin  Rhabdomyolysis Left AKA and excisional debridement of bilateral groin wounds with VAC  placement 08/15/23 Bilateral CFA endarterectomy with bovine patch angioplasty Bilateral iliofemoral embolectomy, stenting Bilateral CIA, stent Rt EIA and 4 compartment fasciotomies: Followed by vascular surger.Patient with baseline Doppler flow right DP. S/P procedures as above.  Bilateral groin appears to be healing well >wound VAC discontinued 1/18- cont WTD dressing with silver to bilateral groins.  Indewelling Foley has been discontinued 1/18 Continue Plavix/statin. Continue iv Ancef and po Flagyl x 6 wk from 08/14/22 for E. coli and Proteus [name] in wound culture from FT:DDUK was discussed with Dr. West Bali from ID along with vascular team Dr Donzetta Matters and Dr Carlis Abbott. She has a left arm midline in place.   Acute ICH w/ acute intraparenchymal hematoma in cerebellar vermis measuring 11 cc in volume and mild mass effect upon the fourth ventricle:Confirmed with an MRI. Seen by neurology.  Eventually placed on antiplatelet agents tolerating Plavix 75.  Neurologically stable  Acute respiratory failure with hypoxia COPD/Smoker : Extubated 12/14> On room air currently.Continue current Brovana Pulmicort,Yupleri and albuterol as needed.   Chronic diastolic CHF HTN: BP is stable.  Appears euvolemic.Echo EF 60 to 60% on 12/11 w/  indeterminate diastolic parameters.  Negative balance as below monitor volume status.Continue Coreg, prn Hydralazine. Net IO Since Admission: -1,732.78 mL [09/12/22 0700] Intake/Output Summary (Last 24 hours) at 09/12/2022 0700 Last data filed at 09/12/2022 0316 Gross per 24 hour  Intake 360 ml  Output 650 ml  Net -290 ml   T2DM with uncontrolled hyperglycemia and episode of hypoglycemia 1/15 night:A1c poorly controlled 12.9, she has brittle diabetes.  Diabetes coordinator consulted.  Sugar much improved continue Semglee 5 units, SSI and 2 u premeal insulin.  Monitor and adjust Recent Labs  Lab 09/11/22 0612 09/11/22 1105 09/11/22 1630 09/11/22 2122 09/12/22 0254  GLUCAP 217* 158* 158* 120* 127*      Hx of CAD HLD AS s/p TAVR: Stable, no chest pain.Continue Plavix Lipitor.     Acute blood loss anemia in the setting of surgery: s/p 3 units PRBC so far. Hb stable Recent Labs  Lab 09/06/22 0617 09/08/22 0058  HGB 9.4* 9.8*  HCT 29.2* 30.6*   Depression,bipolar,schizophrenia,insomnia: mood has been stable.Continue on Lexapro daily,Remeron nightly, trazodone nightly.,    Dysphagia:Likely related to [place].core track has been removed.  Tolerating p.o. well augment diet, she does not want to have NG tube or tube feeding. She has had very poor oral intake>was discussed with her in detail she does not want any kind of feeding tube palliative care following.she reports she has been eating well now.   Family encouraged for oral nutrition  or else she will not improve and will not heal  Dietitian has started 48-hour calorie count 1/16 Speech reeval completed 1/18 changing to regular diet> with this hopefully her oral intake will continue to improve.  Patient reports she has been almost finishing her meal.  Constipation improved with stool softener  Pressure injury stage II sacrum POA see below: Pressure Injury 08/04/22 Sacrum Stage 2 -  Partial thickness loss of dermis presenting as a shallow open injury with a red, pink wound bed without slough. (Active)  08/04/22 0100  Location: Sacrum  Location Orientation:   Staging: Stage 2 -  Partial thickness loss of dermis presenting as a shallow open injury with a red, pink wound bed without slough.  Wound Description (Comments):   Present on Admission: Yes  Dressing Type Foam - Lift dressing to assess site every shift 08/31/22 1950   DVT prophylaxis: heparin injection 5,000 Units Start: 08/10/22 1600 Code Status:   Code Status: Full Code Family Communication: plan of care discussed with patient at bedside. Discussed with palliative care team today.  Patient status is: inpatient  because of poor po  intake Level of care: Telemetry Medical  Dispo: The patient is from: Home, w/ Husband.Daughters are involved in care patient prefers her husband to make decision            Anticipated disposition: [facility], clinical for skilled nursing SNF pending. LTACH denied  Objective: Vitals last 24 hrs: Vitals:   09/11/22 2010 09/11/22 2040 09/11/22 2352 09/12/22 0432  BP: (!) 150/61  (!) 153/68 (!) 144/60  Pulse: 78  65 66  Resp: '15  13 13  '$ Temp: 99.1 F (37.3 C)  98.4 F (36.9 C) 98.4 F (36.9 C)  TempSrc: Oral  Oral Oral  SpO2: 96% 96% 98% 98%  Weight:      Height:       Weight change:   Physical Examination:  General exam: AAox3, weak, weak,older appearing HEENT:Oral mucosa moist, Ear/Nose WNL grossly, dentition normal. Respiratory system: bilaterally clear BS,no use of accessory muscle Cardiovascular system: S1 & S2 +, regular rate. Gastrointestinal system: Abdomen soft, bilateral groin with dressing in place,ND,BS+ Nervous System:Alert, awake, moving extremities and grossly nonfocal Extremities: LE ankle edema neg, lt BKA intact staple, RLE incision site healed. Skin: No rashes,no icterus. MSK: Normal muscle bulk,tone, power    Medications reviewed:  Scheduled Meds:  (feeding supplement) PROSource Plus  30 mL Oral BID BM   arformoterol  15 mcg Nebulization BID   atorvastatin  80 mg Oral Daily   budesonide (PULMICORT) nebulizer solution  0.5 mg Nebulization BID   carvedilol  12.5 mg Oral BID WC   Chlorhexidine Gluconate Cloth  6 each Topical Q0600   clopidogrel  75 mg Oral Daily   dronabinol  2.5 mg Oral BID AC   escitalopram  10 mg Oral Daily   Gerhardt's butt cream   Topical BID   heparin injection (subcutaneous)  5,000 Units Subcutaneous Q8H   insulin aspart  0-6 Units Subcutaneous TID WC   insulin aspart  2 Units Subcutaneous TID WC   insulin glargine-yfgn  5 Units Subcutaneous Daily   metroNIDAZOLE  500 mg Oral Q12H   mirtazapine  7.5 mg Oral QHS   multivitamin  with minerals  1 tablet Oral Daily   nicotine  14 mg Transdermal Daily   mouth rinse  15 mL Mouth Rinse Q4H   oxyCODONE  10 mg Oral Q12H   pantoprazole  40 mg Oral Daily   revefenacin  175 mcg Nebulization Daily   senna-docusate  1 tablet Oral QHS   sodium chloride flush  10-40 mL Intracatheter Q12H   sodium chloride flush  10-40 mL Intracatheter Q12H  Continuous Infusions:   ceFAZolin (ANCEF) IV 2 g (09/12/22 0203)   Diet Order             Diet regular Room service appropriate? Yes with Assist; Fluid consistency: Thin  Diet effective now                  Intake/Output Summary (Last 24 hours) at 09/12/2022 0700 Last data filed at 09/12/2022 0316 Gross per 24 hour  Intake 360 ml  Output 650 ml  Net -290 ml    Net IO Since Admission: -1,732.78 mL [09/12/22 0700]  Wt Readings from Last 3 Encounters:  09/10/22 62.6 kg  08/03/22 58.7 kg  07/01/22 54.4 kg     Unresulted Labs (From admission, onward)    None     Data Reviewed:  Hemoglobin improving at 9.8, basic metabolic panel normal  Antimicrobials: Anti-infectives (From admission, onward)    Start     Dose/Rate Route Frequency Ordered Stop   09/09/22 1100  metroNIDAZOLE (FLAGYL) tablet 500 mg        500 mg Oral Every 12 hours 09/09/22 1002     08/20/22 2200  metroNIDAZOLE (FLAGYL) IVPB 500 mg  Status:  Discontinued        500 mg 100 mL/hr over 60 Minutes Intravenous Every 12 hours 08/20/22 1110 08/20/22 1111   08/20/22 2200  metroNIDAZOLE (FLAGYL) tablet 500 mg  Status:  Discontinued        500 mg Per Tube Every 12 hours 08/20/22 1111 09/09/22 1002   08/18/22 2300  ceFAZolin (ANCEF) IVPB 2g/100 mL premix        2 g 200 mL/hr over 30 Minutes Intravenous Every 8 hours 08/18/22 1455     08/18/22 2300  metroNIDAZOLE (FLAGYL) IVPB 500 mg  Status:  Discontinued        500 mg 100 mL/hr over 60 Minutes Intravenous Every 6 hours 08/18/22 1455 08/20/22 1110   08/15/22 1100  vancomycin (VANCOCIN) IVPB 1000 mg/200 mL  premix  Status:  Discontinued        1,000 mg 200 mL/hr over 60 Minutes Intravenous Every 24 hours 08/14/22 1006 08/15/22 1307   08/14/22 1400  piperacillin-tazobactam (ZOSYN) IVPB 3.375 g  Status:  Discontinued        3.375 g 12.5 mL/hr over 240 Minutes Intravenous Every 8 hours 08/14/22 1006 08/18/22 1455   08/14/22 1100  vancomycin (VANCOREADY) IVPB 1500 mg/300 mL        1,500 mg  150 mL/hr over 120 Minutes Intravenous  Once 08/14/22 1006 08/14/22 1502   08/04/22 1045  ceFAZolin (ANCEF) IVPB 2g/100 mL premix        2 g 200 mL/hr over 30 Minutes Intravenous On call to O.R. 08/04/22 0958 08/04/22 1100     Culture/Microbiology    Component Value Date/Time   SDES WOUND 08/14/2022 0836   SPECREQUEST GROIN 08/14/2022 0836   CULT  08/14/2022 0836    MODERATE ESCHERICHIA COLI RARE PROTEUS MIRABILIS MODERATE BACTEROIDES OVATUS MODERATE BACTEROIDES STERCORIS BETA LACTAMASE POSITIVE Performed at [facility], [place] [address], [place], [place] [address]    REPTSTATUS 08/17/2022 FINAL 08/14/2022 1751    Radiology Studies: No results found.   LOS: 74 days   Antonieta Pert, MD [facility]  09/12/2022, 7:00 AM

## 2022-09-12 NOTE — TOC Progression Note (Signed)
Transition of Care [facility]) - Progression Note    Patient Details  Name: Heather [name] MRN: [id number] Date of Birth: [birth year]  Transition of Care [facility]) CM/SW [place], [place] Phone Number: 09/12/2022, 3:25 PM  Clinical Narrative:     CSW spoke with patient's daughter, [name]- family inquired about short term rehab in [place] but later decided on [facility] in [place] .  CSW confirmed w/SNF bed offer and will start authorization- [employer]   Expected Discharge Plan: [place] Barriers to Discharge: [place] (PASRR), Insurance Authorization, Continued Medical Work up, SNF Pending bed offer (cortrak)  Expected Discharge Plan and Services In-house Referral: Clinical Social Work   Post Acute Care Choice: [place] (LTAC) Living arrangements for the past 2 months: [place] Determinants of Health (SDOH) Interventions [place]: Food Insecurity Present (08/11/2022)  Housing: Low Risk  (08/11/2022)  Transportation Needs: No Transportation Needs (08/11/2022)  Utilities: Not At Risk (08/11/2022)  Financial Resource Strain: Medium Risk (10/19/2018)  Physical Activity: Inactive (10/19/2018)  Social Connections: Unknown (10/19/2018)  Tobacco Use: High Risk (08/15/2022)    Readmission Risk Interventions    11/13/2020    2:27 PM  Readmission Risk Prevention Plan  Transportation Screening Complete  PCP or Specialist Appt within 3-5 Days Complete  HRI or [place] Complete  Social Work Consult for [place] Planning/Counseling Complete  Palliative Care Screening Not Applicable  Medication Review Press [employer]) Complete

## 2022-09-13 DIAGNOSIS — I5032 Chronic diastolic (congestive) heart failure: Secondary | ICD-10-CM | POA: Diagnosis not present

## 2022-09-13 DIAGNOSIS — K612 Anorectal abscess: Secondary | ICD-10-CM | POA: Diagnosis not present

## 2022-09-13 DIAGNOSIS — I10 Essential (primary) hypertension: Secondary | ICD-10-CM

## 2022-09-13 DIAGNOSIS — I739 Peripheral vascular disease, unspecified: Secondary | ICD-10-CM | POA: Diagnosis not present

## 2022-09-13 DIAGNOSIS — R638 Other symptoms and signs concerning food and fluid intake: Secondary | ICD-10-CM

## 2022-09-13 DIAGNOSIS — F17200 Nicotine dependence, unspecified, uncomplicated: Secondary | ICD-10-CM

## 2022-09-13 DIAGNOSIS — R1310 Dysphagia, unspecified: Secondary | ICD-10-CM

## 2022-09-13 DIAGNOSIS — S31109D Unspecified open wound of abdominal wall, unspecified quadrant without penetration into peritoneal cavity, subsequent encounter: Secondary | ICD-10-CM

## 2022-09-13 DIAGNOSIS — R1312 Dysphagia, oropharyngeal phase: Secondary | ICD-10-CM

## 2022-09-13 LAB — GLUCOSE, CAPILLARY
Glucose-Capillary: 156 mg/dL — ABNORMAL HIGH (ref 70–99)
Glucose-Capillary: 125 mg/dL — ABNORMAL HIGH (ref 70–99)
Glucose-Capillary: 239 mg/dL — ABNORMAL HIGH (ref 70–99)
Glucose-Capillary: 164 mg/dL — ABNORMAL HIGH (ref 70–99)

## 2022-09-13 NOTE — Progress Notes (Signed)
Mobility Specialist Progress Note:   09/13/22 1009  Mobility  Activity Refused mobility   Pt had a bout of N/V asking to hold mobility. Will follow-up as time alows.   Gareth Eagle Bruna Dills Mobility Specialist Please contact via [employer] or  Rehab Office at [phone]

## 2022-09-13 NOTE — Progress Notes (Signed)
PROGRESS NOTE  Heather [name] QJF:[id number] DOB: [birth year]   PCP: [name], FNP  Patient is from: Home  DOA: 08/04/2022 LOS: 59  Chief complaints No chief complaint on file.    Brief Narrative / Interim history: 72 year old F with PMH of DM-2, diastolic CHF, COPD, HTN, HLD, GERD, PAD, AS s/p TAVR, tobacco use disorder, anxiety, depression and schizophrenia presented to ED on 12/11 with SBP in 200s, dizziness, headache and blurry vision, and admitted to ICU for hemorrhagic cerebral stroke and BLE ischemia.  She underwent BLE femoral endarterectomy and patch angioplasty with iliac stenting and 4 compartment fasciotomies.  Postop, she remained on vent and Cleviprex.  Eventually, she was extubated and transferred to hospitalist service on 12/17.  She also underwent left AKA and excisional debridement of bilateral groin wounds with VAC placement on 12/21.  Hospital course and then noteworthy for episode of hypoglycemia with insulin adjustments.  Initially required tube feed that was later discontinued.  Remains physically deconditioned.  Wound VAC discontinued on 1/18.  She is now WTD dressing with silver to bilateral groins.  Insurance has denied LTACH despite peer-to-peer review and approved SNF.  Seems p.o. intake is slowly improving.  She is also advanced to regular diet per SLP.  Subjective: Seen and examined earlier this morning.  No major events overnight of this morning.  No complaints other than some headache posteriorly.  Denies nausea, vomiting, vision change, focal weakness or numbness.  Objective: Vitals:   09/12/22 2022 09/12/22 2340 09/13/22 0251 09/13/22 0838  BP:  (!) 156/57 (!) 168/65 (!) 174/64  Pulse:  62 62 (!) 59  Resp:  '18 16 16  '$ Temp:  98.9 F (37.2 C) 99 F (37.2 C) 99 F (37.2 C)  TempSrc:  Oral Oral Oral  SpO2: 97% 94% 95% 93%  Weight:      Height:        Examination:  GENERAL: No apparent distress.  Nontoxic. HEENT: MMM.  Vision and hearing grossly  intact.  NECK: Supple.  No apparent JVD.  RESP:  No IWOB.  Fair aeration bilaterally. CVS:  RRR. Heart sounds normal.  ABD/GI/GU: BS+. Abd soft, NTND.  MSK/EXT:  Moves extremities.  Left AKA. SKIN: Nicely healed fasciotomy wounds or RLE.  Healing left AKA wound. NEURO: Awake, alert and oriented appropriately.  No apparent focal neuro deficit. PSYCH: Calm. Normal affect.    Microbiology summarized: 12/11-MRSA PCR screen negative. 12/21-surgical wound culture with pansensitive Proteus mirabilis and E. coli  Assessment and plan: Principal Problem:   PVD (peripheral vascular disease) ([place]) Active Problems:   ICH (intracerebral hemorrhage) ([facility])   Abscess of anal and rectal regions   Decubitus ulcer of sacral region, stage 2 ([place])   Pulmonary edema   Right groin wound   Wound of left groin  Peripheral arterial disease/bilateral lower extremity ischemia -Bilateral CFA endarterectomy with bovine patch angioplasty -Bilateral iliofemoral embolectomy, stenting on 12/11 -Bilateral CIA, stent Rt EIA and 4 compartment fasciotomies on 12/11 -Left AKA and excisional debridement of bilateral groin wounds with VAC placement 08/15/23 -Bilateral groin appears to be healing well >wound VAC discontinued 1/18- cont WTD dressing -Indwelling Foley has been discontinued 1/18 -Continue Plavix/statin. -Continue iv Ancef and po Flagyl x 6 wk from 08/14/22 per ID, Dr. West Bali and VVS, Dr Donzetta Matters and Dr Carlis Abbott.  -Patient has left arm midline in place.    Acute intracranial hemorrhage: MRI showed acute intraparenchymal hematoma in cerebellar vermis measuring 11 cc in volume and mild mass effect upon the fourth  ventricle. Seen by neurology.  Neurologically stable   Acute respiratory failure with hypoxia COPD/tobacco use disorder: -Extubated 12/14> On room air currently. -Continue current Brovana Pulmicort,Yupleri and albuterol as needed.   Chronic diastolic CHF/HTN: TTE with LVEF of 60 to 65%,  indeterminate DD.  Appears euvolemic on exam. -Continue Coreg, prn Hydralazine.  Uncontrolled NIDDM-2 with hyperglycemia and hypoglycemia: A1c 12.9% on 12/12.  CBG within acceptable range. Recent Labs  Lab 09/12/22 1155 09/12/22 1705 09/12/22 2115 09/13/22 0615 09/13/22 1155  GLUCAP 206* 185* 153* 156* 125*  -Continue current insulin regimen -Continue statin    Hx of CAD/HLD: Stable. -Continue Coreg, Lipitor and Plavix  AS s/p TAVR: Noted on stable.   Acute blood loss anemia in the setting of surgery: s/p 3 units PRBC so far. Hb stable Recent Labs    08/28/22 0340 08/29/22 0500 08/30/22 1133 08/31/22 0103 09/02/22 0709 09/03/22 0054 09/04/22 0315 09/05/22 0644 09/06/22 0617 09/08/22 0058  HGB 9.4* 8.0* 8.4* 8.1* 9.0* 8.9* 8.8* 11.9* 9.4* 9.8*  -Monitor intermittently.  Depression,bipolar,schizophrenia,insomnia: mood has been stable. -Continue on Lexapro daily,Remeron nightly, trazodone nightly.    Dysphagia:Likely related to [place]. Cortrack has been removed. She does not want to have NG tube or tube feeding. Family encouraged for oral nutrition.  Dysphagia seems to have resolved. -Upgraded to regular diet by speech -Dietitian following.  Increased nutrient needs/decreased oral intake: Body mass index is 26.95 kg/m. Nutrition Problem: Increased nutrient needs Etiology: wound healing Signs/Symptoms: estimated needs Interventions: Refer to RD note for recommendations  Pressure skin injury: POA Pressure Injury 08/04/22 Sacrum Stage 2 -  Partial thickness loss of dermis presenting as a shallow open injury with a red, pink wound bed without slough. (Active)  08/04/22 0100  Location: Sacrum  Location Orientation:   Staging: Stage 2 -  Partial thickness loss of dermis presenting as a shallow open injury with a red, pink wound bed without slough.  Wound Description (Comments):   Present on Admission: Yes  Dressing Type Foam - Lift dressing to assess site every shift  09/12/22 1935   DVT prophylaxis:  heparin injection 5,000 Units Start: 08/10/22 1600  Code Status: Full code Family Communication: None at bedside Level of care: Telemetry Medical Status is: Inpatient Remains inpatient appropriate because: Poor oral intake and insurance approval for SNF   Final disposition: SNF Consultants:  Vascular surgery Infectious disease Palliative medicine  35 minutes with more than 50% spent in reviewing records, counseling patient/family and coordinating care.   Sch Meds:  Scheduled Meds:  (feeding supplement) PROSource Plus  30 mL Oral BID BM   arformoterol  15 mcg Nebulization BID   atorvastatin  80 mg Oral Daily   budesonide (PULMICORT) nebulizer solution  0.5 mg Nebulization BID   carvedilol  12.5 mg Oral BID WC   Chlorhexidine Gluconate Cloth  6 each Topical Q0600   clopidogrel  75 mg Oral Daily   dronabinol  2.5 mg Oral BID AC   escitalopram  10 mg Oral Daily   Gerhardt's butt cream   Topical BID   heparin injection (subcutaneous)  5,000 Units Subcutaneous Q8H   insulin aspart  0-6 Units Subcutaneous TID WC   insulin aspart  2 Units Subcutaneous TID WC   insulin glargine-yfgn  5 Units Subcutaneous Daily   metroNIDAZOLE  500 mg Oral Q12H   mirtazapine  7.5 mg Oral QHS   multivitamin with minerals  1 tablet Oral Daily   nicotine  14 mg Transdermal Daily   oxyCODONE  10 mg Oral Q12H   pantoprazole  40 mg Oral Daily   revefenacin  175 mcg Nebulization Daily   senna-docusate  1 tablet Oral QHS   sodium chloride flush  10-40 mL Intracatheter Q12H   sodium chloride flush  10-40 mL Intracatheter Q12H   sodium hypochlorite   Irrigation Daily   Continuous Infusions:   ceFAZolin (ANCEF) IV 2 g (09/13/22 0252)   PRN Meds:.acetaminophen **OR** acetaminophen (TYLENOL) oral liquid 160 mg/5 mL **OR** acetaminophen, albuterol, hydrALAZINE, HYDROmorphone (DILAUDID) injection, ondansetron (ZOFRAN) IV, oxyCODONE, polyethylene glycol, sodium chloride  flush, traZODone  Antimicrobials: Anti-infectives (From admission, onward)    Start     Dose/Rate Route Frequency Ordered Stop   09/09/22 1100  metroNIDAZOLE (FLAGYL) tablet 500 mg        500 mg Oral Every 12 hours 09/09/22 1002     08/20/22 2200  metroNIDAZOLE (FLAGYL) IVPB 500 mg  Status:  Discontinued        500 mg 100 mL/hr over 60 Minutes Intravenous Every 12 hours 08/20/22 1110 08/20/22 1111   08/20/22 2200  metroNIDAZOLE (FLAGYL) tablet 500 mg  Status:  Discontinued        500 mg Per Tube Every 12 hours 08/20/22 1111 09/09/22 1002   08/18/22 2300  ceFAZolin (ANCEF) IVPB 2g/100 mL premix        2 g 200 mL/hr over 30 Minutes Intravenous Every 8 hours 08/18/22 1455     08/18/22 2300  metroNIDAZOLE (FLAGYL) IVPB 500 mg  Status:  Discontinued        500 mg 100 mL/hr over 60 Minutes Intravenous Every 6 hours 08/18/22 1455 08/20/22 1110   08/15/22 1100  vancomycin (VANCOCIN) IVPB 1000 mg/200 mL premix  Status:  Discontinued        1,000 mg 200 mL/hr over 60 Minutes Intravenous Every 24 hours 08/14/22 1006 08/15/22 1307   08/14/22 1400  piperacillin-tazobactam (ZOSYN) IVPB 3.375 g  Status:  Discontinued        3.375 g 12.5 mL/hr over 240 Minutes Intravenous Every 8 hours 08/14/22 1006 08/18/22 1455   08/14/22 1100  vancomycin (VANCOREADY) IVPB 1500 mg/300 mL        1,500 mg 150 mL/hr over 120 Minutes Intravenous  Once 08/14/22 1006 08/14/22 1502   08/04/22 1045  ceFAZolin (ANCEF) IVPB 2g/100 mL premix        2 g 200 mL/hr over 30 Minutes Intravenous On call to O.R. 08/04/22 0958 08/04/22 1100        I have personally reviewed the following labs and images: CBC: Recent Labs  Lab 09/08/22 0058  WBC 5.5  HGB 9.8*  HCT 30.6*  MCV 91.6  PLT 377   BMP &GFR Recent Labs  Lab 09/08/22 0058  NA 135  K 3.5  CL 100  CO2 25  GLUCOSE 91  BUN 9  CREATININE 0.81  CALCIUM 8.2*   Estimated Creatinine Clearance: 54.1 mL/min (by C-G formula based on SCr of 0.81 mg/dL). Liver &  Pancreas: No results for input(s): "AST", "ALT", "ALKPHOS", "BILITOT", "PROT", "ALBUMIN" in the last 168 hours. No results for input(s): "LIPASE", "AMYLASE" in the last 168 hours. No results for input(s): "AMMONIA" in the last 168 hours. Diabetic: No results for input(s): "HGBA1C" in the last 72 hours. Recent Labs  Lab 09/12/22 0632 09/12/22 1155 09/12/22 1705 09/12/22 2115 09/13/22 0615  GLUCAP 127* 206* 185* 153* 156*   Cardiac Enzymes: No results for input(s): "CKTOTAL", "CKMB", "CKMBINDEX", "TROPONINI" in the last 168 hours. No  results for input(s): "PROBNP" in the last 8760 hours. Coagulation Profile: No results for input(s): "INR", "PROTIME" in the last 168 hours. Thyroid Function Tests: No results for input(s): "TSH", "T4TOTAL", "FREET4", "T3FREE", "THYROIDAB" in the last 72 hours. Lipid Profile: No results for input(s): "CHOL", "HDL", "LDLCALC", "TRIG", "CHOLHDL", "LDLDIRECT" in the last 72 hours. Anemia Panel: No results for input(s): "VITAMINB12", "FOLATE", "FERRITIN", "TIBC", "IRON", "RETICCTPCT" in the last 72 hours. Urine analysis:    Component Value Date/Time   COLORURINE YELLOW (A) 11/09/2020 1652   APPEARANCEUR CLOUDY (A) 11/09/2020 1652   APPEARANCEUR Clear 06/20/2012 2127   LABSPEC 1.017 11/09/2020 1652   LABSPEC 1.022 06/20/2012 2127   PHURINE 5.0 11/09/2020 1652   GLUCOSEU NEGATIVE 11/09/2020 1652   GLUCOSEU >=500 06/20/2012 2127   HGBUR NEGATIVE 11/09/2020 [place] NEGATIVE 11/09/2020 1652   BILIRUBINUR Negative 06/20/2012 2127   KETONESUR NEGATIVE 11/09/2020 1652   PROTEINUR NEGATIVE 11/09/2020 1652   NITRITE NEGATIVE 11/09/2020 1652   LEUKOCYTESUR MODERATE (A) 11/09/2020 1652   LEUKOCYTESUR Negative 06/20/2012 2127   Sepsis Labs: Invalid input(s): "PROCALCITONIN", "LACTICIDVEN"  Microbiology: No results found for this or any previous visit (from the past 240 hour(s)).  Radiology Studies: No results found.    Maleyah Evans T.  [place]  If 7PM-7AM, please contact night-coverage [email or web address] 09/13/2022, 11:49 AM

## 2022-09-14 DIAGNOSIS — R638 Other symptoms and signs concerning food and fluid intake: Secondary | ICD-10-CM | POA: Diagnosis not present

## 2022-09-14 DIAGNOSIS — I5032 Chronic diastolic (congestive) heart failure: Secondary | ICD-10-CM | POA: Diagnosis not present

## 2022-09-14 DIAGNOSIS — I739 Peripheral vascular disease, unspecified: Secondary | ICD-10-CM | POA: Diagnosis not present

## 2022-09-14 DIAGNOSIS — K612 Anorectal abscess: Secondary | ICD-10-CM | POA: Diagnosis not present

## 2022-09-14 LAB — GLUCOSE, CAPILLARY
Glucose-Capillary: 154 mg/dL — ABNORMAL HIGH (ref 70–99)
Glucose-Capillary: 155 mg/dL — ABNORMAL HIGH (ref 70–99)
Glucose-Capillary: 153 mg/dL — ABNORMAL HIGH (ref 70–99)
Glucose-Capillary: 183 mg/dL — ABNORMAL HIGH (ref 70–99)

## 2022-09-14 MED ORDER — METHOCARBAMOL 500 MG PO TABS
500.0000 mg | ORAL_TABLET | Freq: Three times a day (TID) | ORAL | Status: DC | PRN
  Administered 2022-09-14 – 2022-09-18 (×5): 500 mg via ORAL
  Filled 2022-09-14 (×5): qty 1

## 2022-09-14 NOTE — Progress Notes (Signed)
PROGRESS NOTE  Heather [name] NOM:[id number] DOB: 03/09/1951   PCP: [name], FNP  Patient is from: Home  DOA: 08/04/2022 LOS: 20  Chief complaints No chief complaint on file.    Brief Narrative / Interim history: 72 year old F with PMH of DM-2, diastolic CHF, COPD, HTN, HLD, GERD, PAD, AS s/p TAVR, tobacco use disorder, anxiety, depression and schizophrenia presented to ED on 12/11 with SBP in 200s, dizziness, headache and blurry vision, and admitted to ICU for hemorrhagic cerebral stroke and BLE ischemia.  She underwent BLE femoral endarterectomy and patch angioplasty with iliac stenting and 4 compartment fasciotomies.  Postop, she remained on vent and Cleviprex.  Eventually, she was extubated and transferred to hospitalist service on 12/17.  She also underwent left AKA and excisional debridement of bilateral groin wounds with VAC placement on 12/21.  Hospital course and then noteworthy for episode of hypoglycemia with insulin adjustments.  Initially required tube feed that was later discontinued.  Remains physically deconditioned.  Wound VAC discontinued on 1/18.  She is now WTD dressing with silver to bilateral groins.  Insurance has denied LTACH despite peer-to-peer review and approved SNF.  Seems p.o. intake is slowly improving.  She is also advanced to regular diet per SLP.  Subjective: Seen and examined earlier this morning.  No major events overnight of this morning.  Patient has no complaints.  Some concern about persistent or worsening greenish drainage with a small amount of tunneling and left groin wound per vascular surgery.  Objective: Vitals:   09/13/22 2030 09/14/22 0315 09/14/22 0923 09/14/22 0929  BP:  (!) 165/64    Pulse: 63 74 76   Resp: '16 15 18   '$ Temp:  98.3 F (36.8 C) 99 F (37.2 C)   TempSrc:   Oral   SpO2: 94% 91%  93%  Weight:      Height:        Examination:  GENERAL: No apparent distress.  Nontoxic. HEENT: MMM.  Vision and hearing grossly  intact.  NECK: Supple.  No apparent JVD.  RESP:  No IWOB.  Fair aeration bilaterally. CVS:  RRR. Heart sounds normal.  ABD/GI/GU: BS+. Abd soft, NTND.  MSK/EXT:  Moves extremities. No apparent deformity. No edema.  SKIN: Healed fasciotomy wounds in RLE.  Healing left AKA wound.  New dressing over bilateral groins DCI. NEURO: Awake and alert. Oriented appropriately.  No apparent focal neuro deficit. PSYCH: Calm. Normal affect.    Microbiology summarized: 12/11-MRSA PCR screen negative. 12/21-surgical wound culture with pansensitive Proteus mirabilis and E. coli  Assessment and plan: Principal Problem:   PVD (peripheral vascular disease) ([place]) Active Problems:   Hypertension, benign   S/P TAVR (transcatheter aortic valve replacement)   Tobacco use disorder   PAD (peripheral artery disease) ([facility])   ICH (intracerebral hemorrhage) ([facility])   Abscess of anal and rectal regions   Decubitus ulcer of sacral region, stage 2 ([place])   Pulmonary edema   Right groin wound   Wound of left groin   Decreased oral intake   Chronic diastolic CHF (congestive heart failure) ([place])   Dysphagia  Peripheral arterial disease/bilateral lower extremity ischemia -Bilateral CFA endarterectomy with bovine patch angioplasty -Bilateral iliofemoral embolectomy, stenting on 12/11 -Bilateral CIA, stent Rt EIA and 4 compartment fasciotomies on 12/11 -Left AKA and excisional debridement of bilateral groin wounds with VAC placement 08/15/23 -Bilateral groin appears to be healing well >wound VAC discontinued 1/18- cont WTD dressing -1/21-concerned about persistent or worsening greenish drainage with tunneling per vascular  surgery. -Indwelling Foley has been discontinued 1/18 -Continue Plavix/statin. -Continue iv Ancef and po Flagyl x 6 wk from 08/14/22 per ID, Dr. West Bali and VVS -Concern about persistent worsening greenish drainage with tunneling of left groin wound per VVS -Repeat left groin washout?  Defer  management to vascular surgery. -Patient has left arm midline in place.    Acute intracranial hemorrhage: MRI showed acute intraparenchymal hematoma in cerebellar vermis measuring 11 cc in volume and mild mass effect upon the fourth ventricle. Seen by neurology.  Neurologically stable   Acute respiratory failure with hypoxia COPD/tobacco use disorder: -Extubated 12/14> On room air currently. -Continue current Brovana Pulmicort,Yupleri and albuterol as needed.   Chronic diastolic CHF/HTN: TTE with LVEF of 60 to 65%, indeterminate DD.  Appears euvolemic on exam. -Continue Coreg, prn Hydralazine.  Uncontrolled NIDDM-2 with hyperglycemia and hypoglycemia: A1c 12.9% on 12/12.  CBG within acceptable range. Recent Labs  Lab 09/13/22 0615 09/13/22 1155 09/13/22 1732 09/13/22 2119 09/14/22 0558  GLUCAP 156* 125* 239* 164* 154*  -Continue current insulin regimen -Continue statin    Hx of CAD/HLD: Stable. -Continue Coreg, Lipitor and Plavix  AS s/p TAVR: Noted on stable.   Acute blood loss anemia in the setting of surgery: s/p 3 units PRBC so far. Hb stable Recent Labs    08/28/22 0340 08/29/22 0500 08/30/22 1133 08/31/22 0103 09/02/22 0709 09/03/22 0054 09/04/22 0315 09/05/22 0644 09/06/22 0617 09/08/22 0058  HGB 9.4* 8.0* 8.4* 8.1* 9.0* 8.9* 8.8* 11.9* 9.4* 9.8*  -Monitor intermittently.  Depression,bipolar,schizophrenia,insomnia: mood has been stable. -Continue on Lexapro daily,Remeron nightly, trazodone nightly.    Dysphagia:Likely related to [place]. Cortrack has been removed. She does not want to have NG tube or tube feeding. Family encouraged for oral nutrition.  Dysphagia seems to have resolved. -Upgraded to regular diet by speech -Dietitian following.  Increased nutrient needs/decreased oral intake: Body mass index is 26.95 kg/m. Nutrition Problem: Increased nutrient needs Etiology: wound healing Signs/Symptoms: estimated needs Interventions: Refer to RD note for  recommendations  Pressure skin injury: POA Pressure Injury 08/04/22 Sacrum Stage 2 -  Partial thickness loss of dermis presenting as a shallow open injury with a red, pink wound bed without slough. (Active)  08/04/22 0100  Location: Sacrum  Location Orientation:   Staging: Stage 2 -  Partial thickness loss of dermis presenting as a shallow open injury with a red, pink wound bed without slough.  Wound Description (Comments):   Present on Admission: Yes  Dressing Type Foam - Lift dressing to assess site every shift 09/13/22 2000   DVT prophylaxis:  heparin injection 5,000 Units Start: 08/10/22 1600  Code Status: Full code Family Communication: None at bedside Level of care: Telemetry Medical Status is: Inpatient Remains inpatient appropriate because: Poor oral intake and insurance approval for SNF   Final disposition: SNF Consultants:  Vascular surgery Infectious disease Palliative medicine  35 minutes with more than 50% spent in reviewing records, counseling patient/family and coordinating care.   Sch Meds:  Scheduled Meds:  (feeding supplement) PROSource Plus  30 mL Oral BID BM   arformoterol  15 mcg Nebulization BID   atorvastatin  80 mg Oral Daily   budesonide (PULMICORT) nebulizer solution  0.5 mg Nebulization BID   carvedilol  12.5 mg Oral BID WC   Chlorhexidine Gluconate Cloth  6 each Topical Q0600   clopidogrel  75 mg Oral Daily   dronabinol  2.5 mg Oral BID AC   escitalopram  10 mg Oral Daily   Gerhardt's  butt cream   Topical BID   heparin injection (subcutaneous)  5,000 Units Subcutaneous Q8H   insulin aspart  0-6 Units Subcutaneous TID WC   insulin aspart  2 Units Subcutaneous TID WC   insulin glargine-yfgn  5 Units Subcutaneous Daily   metroNIDAZOLE  500 mg Oral Q12H   mirtazapine  7.5 mg Oral QHS   multivitamin with minerals  1 tablet Oral Daily   nicotine  14 mg Transdermal Daily   oxyCODONE  10 mg Oral Q12H   pantoprazole  40 mg Oral Daily    revefenacin  175 mcg Nebulization Daily   senna-docusate  1 tablet Oral QHS   sodium chloride flush  10-40 mL Intracatheter Q12H   sodium chloride flush  10-40 mL Intracatheter Q12H   sodium hypochlorite   Irrigation Daily   Continuous Infusions:   ceFAZolin (ANCEF) IV 2 g (09/14/22 0934)   PRN Meds:.acetaminophen **OR** acetaminophen (TYLENOL) oral liquid 160 mg/5 mL **OR** acetaminophen, albuterol, hydrALAZINE, HYDROmorphone (DILAUDID) injection, ondansetron (ZOFRAN) IV, oxyCODONE, polyethylene glycol, sodium chloride flush, traZODone  Antimicrobials: Anti-infectives (From admission, onward)    Start     Dose/Rate Route Frequency Ordered Stop   09/09/22 1100  metroNIDAZOLE (FLAGYL) tablet 500 mg        500 mg Oral Every 12 hours 09/09/22 1002     08/20/22 2200  metroNIDAZOLE (FLAGYL) IVPB 500 mg  Status:  Discontinued        500 mg 100 mL/hr over 60 Minutes Intravenous Every 12 hours 08/20/22 1110 08/20/22 1111   08/20/22 2200  metroNIDAZOLE (FLAGYL) tablet 500 mg  Status:  Discontinued        500 mg Per Tube Every 12 hours 08/20/22 1111 09/09/22 1002   08/18/22 2300  ceFAZolin (ANCEF) IVPB 2g/100 mL premix        2 g 200 mL/hr over 30 Minutes Intravenous Every 8 hours 08/18/22 1455     08/18/22 2300  metroNIDAZOLE (FLAGYL) IVPB 500 mg  Status:  Discontinued        500 mg 100 mL/hr over 60 Minutes Intravenous Every 6 hours 08/18/22 1455 08/20/22 1110   08/15/22 1100  vancomycin (VANCOCIN) IVPB 1000 mg/200 mL premix  Status:  Discontinued        1,000 mg 200 mL/hr over 60 Minutes Intravenous Every 24 hours 08/14/22 1006 08/15/22 1307   08/14/22 1400  piperacillin-tazobactam (ZOSYN) IVPB 3.375 g  Status:  Discontinued        3.375 g 12.5 mL/hr over 240 Minutes Intravenous Every 8 hours 08/14/22 1006 08/18/22 1455   08/14/22 1100  vancomycin (VANCOREADY) IVPB 1500 mg/300 mL        1,500 mg 150 mL/hr over 120 Minutes Intravenous  Once 08/14/22 1006 08/14/22 1502   08/04/22 1045   ceFAZolin (ANCEF) IVPB 2g/100 mL premix        2 g 200 mL/hr over 30 Minutes Intravenous On call to O.R. 08/04/22 0958 08/04/22 1100        I have personally reviewed the following labs and images: CBC: Recent Labs  Lab 09/08/22 0058  WBC 5.5  HGB 9.8*  HCT 30.6*  MCV 91.6  PLT 377   BMP &GFR Recent Labs  Lab 09/08/22 0058  NA 135  K 3.5  CL 100  CO2 25  GLUCOSE 91  BUN 9  CREATININE 0.81  CALCIUM 8.2*   Estimated Creatinine Clearance: 54.1 mL/min (by C-G formula based on SCr of 0.81 mg/dL). Liver & Pancreas: No results for  input(s): "AST", "ALT", "ALKPHOS", "BILITOT", "PROT", "ALBUMIN" in the last 168 hours. No results for input(s): "LIPASE", "AMYLASE" in the last 168 hours. No results for input(s): "AMMONIA" in the last 168 hours. Diabetic: No results for input(s): "HGBA1C" in the last 72 hours. Recent Labs  Lab 09/13/22 0615 09/13/22 1155 09/13/22 1732 09/13/22 2119 09/14/22 0558  GLUCAP 156* 125* 239* 164* 154*   Cardiac Enzymes: No results for input(s): "CKTOTAL", "CKMB", "CKMBINDEX", "TROPONINI" in the last 168 hours. No results for input(s): "PROBNP" in the last 8760 hours. Coagulation Profile: No results for input(s): "INR", "PROTIME" in the last 168 hours. Thyroid Function Tests: No results for input(s): "TSH", "T4TOTAL", "FREET4", "T3FREE", "THYROIDAB" in the last 72 hours. Lipid Profile: No results for input(s): "CHOL", "HDL", "LDLCALC", "TRIG", "CHOLHDL", "LDLDIRECT" in the last 72 hours. Anemia Panel: No results for input(s): "VITAMINB12", "FOLATE", "FERRITIN", "TIBC", "IRON", "RETICCTPCT" in the last 72 hours. Urine analysis:    Component Value Date/Time   COLORURINE YELLOW (A) 11/09/2020 1652   APPEARANCEUR CLOUDY (A) 11/09/2020 1652   APPEARANCEUR Clear 06/20/2012 2127   LABSPEC 1.017 11/09/2020 1652   LABSPEC 1.022 06/20/2012 2127   PHURINE 5.0 11/09/2020 1652   GLUCOSEU NEGATIVE 11/09/2020 1652   GLUCOSEU >=500 06/20/2012 2127    HGBUR NEGATIVE 11/09/2020 [place] NEGATIVE 11/09/2020 1652   BILIRUBINUR Negative 06/20/2012 2127   KETONESUR NEGATIVE 11/09/2020 1652   PROTEINUR NEGATIVE 11/09/2020 1652   NITRITE NEGATIVE 11/09/2020 1652   LEUKOCYTESUR MODERATE (A) 11/09/2020 1652   LEUKOCYTESUR Negative 06/20/2012 2127   Sepsis Labs: Invalid input(s): "PROCALCITONIN", "LACTICIDVEN"  Microbiology: No results found for this or any previous visit (from the past 240 hour(s)).  Radiology Studies: No results found.    Leandre Wien T. [place]  If 7PM-7AM, please contact night-coverage [email or web address] 09/14/2022, 11:44 AM

## 2022-09-14 NOTE — Progress Notes (Addendum)
  Progress Note    09/14/2022 8:30 AM 31 Days Post-Op  Subjective:  resting comfortably, no complaints    Vitals:   09/13/22 2030 09/14/22 0315  BP:  (!) 165/64  Pulse: 63 74  Resp: 16 15  Temp:  98.3 F (36.8 C)  SpO2: 94% 91%    Physical Exam: Lungs:  nonlabored Incisions:  R groin wound with healthy tissue and serous drainage. L groin wound with persistent green drainage with small amount of tunneling Extremities:  L AKA healthy with staples removed. RLE with DP/PT doppler signals   CBC    Component Value Date/Time   WBC 5.5 09/08/2022 0058   RBC 3.34 (L) 09/08/2022 0058   HGB 9.8 (L) 09/08/2022 0058   HGB 14.3 06/20/2012 2127   HCT 30.6 (L) 09/08/2022 0058   HCT 42.9 06/20/2012 2127   PLT 377 09/08/2022 0058   PLT 357 06/20/2012 2127   MCV 91.6 09/08/2022 0058   MCV 91 06/20/2012 2127   MCH 29.3 09/08/2022 0058   MCHC 32.0 09/08/2022 0058   RDW 15.6 (H) 09/08/2022 0058   RDW 13.6 06/20/2012 2127   LYMPHSABS 1.3 08/31/2022 0103   MONOABS 0.9 08/31/2022 0103   EOSABS 0.3 08/31/2022 0103   BASOSABS 0.1 08/31/2022 0103    BMET    Component Value Date/Time   NA 135 09/08/2022 0058   NA 135 (L) 06/20/2012 2127   K 3.5 09/08/2022 0058   K 4.0 06/20/2012 2127   CL 100 09/08/2022 0058   CL 100 06/20/2012 2127   CO2 25 09/08/2022 0058   CO2 27 06/20/2012 2127   GLUCOSE 91 09/08/2022 0058   GLUCOSE 250 (H) 06/20/2012 2127   BUN 9 09/08/2022 0058   BUN 20 (H) 06/20/2012 2127   CREATININE 0.81 09/08/2022 0058   CREATININE 0.60 06/20/2012 2127   CALCIUM 8.2 (L) 09/08/2022 0058   CALCIUM 8.8 06/20/2012 2127   GFRNONAA >60 09/08/2022 0058   GFRNONAA >60 06/20/2012 2127   GFRAA >60 06/20/2012 2127    INR    Component Value Date/Time   INR 1.0 08/03/2022 2156     Intake/Output Summary (Last 24 hours) at 09/14/2022 0830 Last data filed at 09/13/2022 1728 Gross per 24 hour  Intake 480 ml  Output 650 ml  Net -170 ml      Assessment/Plan:  72  y.o. female is s/p: bilateral lower extremity revascularization with groin debridement and L AKA   -L AKA healing appropriately. Staples removed today -R groin with healthy tissue and moderate serous drainage. Will continue RN daily WTD dressing changes with silver -L groin had some mild green drainage on 1/19, so she was initiated on WTD dakins dressing changes with silver for topical pseudomonas coverage. She is currently also on Ancef and Flagyl based off previous cultures. On exam today the L groin has persistent and worsening green drainage with a small amount of tunneling. Tunneling was not probed for depth. Will reach out tomorrow morning to ID to broaden IV abx coverage for Pseudomonas. Patient will likely need repeat L groin washout this week  Vicente Serene, PA-C [facility] [phone] 09/14/2022 8:30 AM

## 2022-09-15 DIAGNOSIS — T8149XA Infection following a procedure, other surgical site, initial encounter: Secondary | ICD-10-CM | POA: Diagnosis not present

## 2022-09-15 DIAGNOSIS — I5032 Chronic diastolic (congestive) heart failure: Secondary | ICD-10-CM | POA: Diagnosis not present

## 2022-09-15 DIAGNOSIS — K612 Anorectal abscess: Secondary | ICD-10-CM | POA: Diagnosis not present

## 2022-09-15 DIAGNOSIS — R638 Other symptoms and signs concerning food and fluid intake: Secondary | ICD-10-CM | POA: Diagnosis not present

## 2022-09-15 DIAGNOSIS — I739 Peripheral vascular disease, unspecified: Secondary | ICD-10-CM | POA: Diagnosis not present

## 2022-09-15 LAB — GLUCOSE, CAPILLARY
Glucose-Capillary: 156 mg/dL — ABNORMAL HIGH (ref 70–99)
Glucose-Capillary: 167 mg/dL — ABNORMAL HIGH (ref 70–99)
Glucose-Capillary: 137 mg/dL — ABNORMAL HIGH (ref 70–99)
Glucose-Capillary: 154 mg/dL — ABNORMAL HIGH (ref 70–99)
Glucose-Capillary: 175 mg/dL — ABNORMAL HIGH (ref 70–99)
Glucose-Capillary: 226 mg/dL — ABNORMAL HIGH (ref 70–99)

## 2022-09-15 LAB — BASIC METABOLIC PANEL
Anion gap: 4 — ABNORMAL LOW (ref 5–15)
BUN: 7 mg/dL — ABNORMAL LOW (ref 8–23)
CO2: 27 mmol/L (ref 22–32)
Calcium: 8.3 mg/dL — ABNORMAL LOW (ref 8.9–10.3)
Chloride: 103 mmol/L (ref 98–111)
Creatinine, Ser: 0.67 mg/dL (ref 0.44–1.00)
GFR, Estimated: >60 mL/min (ref >60)
Glucose, Bld: 160 mg/dL — ABNORMAL HIGH (ref 70–99)
Potassium: 3.9 mmol/L (ref 3.5–5.1)
Sodium: 134 mmol/L — ABNORMAL LOW (ref 135–145)

## 2022-09-15 MED ORDER — ONDANSETRON HCL 4 MG PO TABS
4.0000 mg | ORAL_TABLET | Freq: Three times a day (TID) | ORAL | Status: DC | PRN
  Administered 2022-09-15: 4 mg via ORAL
  Filled 2022-09-15: qty 1

## 2022-09-15 MED ORDER — ONDANSETRON HCL 4 MG/2ML IJ SOLN: 4.0000 mg | Freq: Three times a day (TID) | INTRAMUSCULAR | Status: DC | PRN

## 2022-09-15 NOTE — Progress Notes (Addendum)
Progress Note    09/15/2022 7:58 AM 32 Days Post-Op  Subjective:  resting comfortably    Vitals:   09/15/22 0600 09/15/22 0728  BP: (!) 136/58 (!) 156/62  Pulse:  71  Resp: 16 18  Temp: 98.4 F (36.9 C) 98.8 F (37.1 C)  SpO2:  91%    Physical Exam: Lungs:  nonlabored Incisions:  R groin wound healing appropriately with serous drainage. L groin wound with continued green drainage and tunneling. L AKA healing appropriately, staples have been removed Extremities:  R DP/PT doppler signals   CBC    Component Value Date/Time   WBC 5.5 09/08/2022 0058   RBC 3.34 (L) 09/08/2022 0058   HGB 9.8 (L) 09/08/2022 0058   HGB 14.3 06/20/2012 2127   HCT 30.6 (L) 09/08/2022 0058   HCT 42.9 06/20/2012 2127   PLT 377 09/08/2022 0058   PLT 357 06/20/2012 2127   MCV 91.6 09/08/2022 0058   MCV 91 06/20/2012 2127   MCH 29.3 09/08/2022 0058   MCHC 32.0 09/08/2022 0058   RDW 15.6 (H) 09/08/2022 0058   RDW 13.6 06/20/2012 2127   LYMPHSABS 1.3 08/31/2022 0103   MONOABS 0.9 08/31/2022 0103   EOSABS 0.3 08/31/2022 0103   BASOSABS 0.1 08/31/2022 0103    BMET    Component Value Date/Time   NA 134 (L) 09/15/2022 0102   NA 135 (L) 06/20/2012 2127   K 3.9 09/15/2022 0102   K 4.0 06/20/2012 2127   CL 103 09/15/2022 0102   CL 100 06/20/2012 2127   CO2 27 09/15/2022 0102   CO2 27 06/20/2012 2127   GLUCOSE 160 (H) 09/15/2022 0102   GLUCOSE 250 (H) 06/20/2012 2127   BUN 7 (L) 09/15/2022 0102   BUN 20 (H) 06/20/2012 2127   CREATININE 0.67 09/15/2022 0102   CREATININE 0.60 06/20/2012 2127   CALCIUM 8.3 (L) 09/15/2022 0102   CALCIUM 8.8 06/20/2012 2127   GFRNONAA >60 09/15/2022 0102   GFRNONAA >60 06/20/2012 2127   GFRAA >60 06/20/2012 2127    INR    Component Value Date/Time   INR 1.0 08/03/2022 2156     Intake/Output Summary (Last 24 hours) at 09/15/2022 0758 Last data filed at 09/15/2022 0755 Gross per 24 hour  Intake 720 ml  Output 1050 ml  Net -330 ml       Assessment/Plan:  72 y.o. female is 32 days post op,s/p:  bilateral lower extremity revascularization with groin debridement and L AKA      -L AKA healing well. Staples were removed yesterday. Will order stump sock -R groin healing appropriately with moderate serous drainage. Will continue RN daily WTD dressing with silver -L groin with persistent green drainage and tunneling. Will continue Dakins WTD dressing for now. Currently on IV ancef and flagyl. ID has been consulted. They will tailor abx coverage pending cultures obtained on 1/23 -She will likely undergo repeat L groin washout in OR tomorrow with Dr.Jorryn Hershberger. NPO after midnight. Consent orders have been placed   Vicente Serene, PA-C [facility] [phone] 09/15/2022 7:58 AM   I have independently interviewed and examined patient and agree with PA assessment and plan above. Appears to have pseudomonas in left groin despite dakins wtd dressings. Will plan washout of both groins in OR tomorrow and evaluate for tunneling on left that I could not appreciate on exam today but is apparent on previous images.   Bowe Sidor C. Donzetta Matters, MD [facility] [place] Office: [phone] Pager: [phone]

## 2022-09-15 NOTE — Progress Notes (Signed)
Physical Therapy Treatment Patient Details Name: Heather [name] MRN: [id number] DOB: 1951-07-17 Today's Date: 09/15/2022   History of Present Illness Pt is a 72 y.o. female admitted 08/04/22 with headache, dizziness. 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  Found to have mottled LE's after given Narcdipine due to HTN; s/p emergent common femoral and iliofemoral endarterectomy and bilateral 4 compartment fasciotomies on 12/11. 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 PMH includes smoker(2ppd), severe AI/AS s/p TAVR, CAD s/p PCI and on DAPT, COPD, PAD, HTN.    PT Comments    Pt making very slow progress. Working on becoming more mobile and improving sitting balance on EOB. Pt fearful of falling and working toward pt being more comfortable with all mobility. Continue to recommend SNF at DC.    Recommendations for follow up therapy are one component of a multi-disciplinary discharge planning process, led by the attending physician.  Recommendations may be updated based on patient status, additional functional criteria and insurance authorization.  Follow Up Recommendations  Skilled nursing-short term rehab (<3 hours/day) Can patient physically be transported by private vehicle: No   Assistance Recommended at Discharge Frequent or constant Supervision/Assistance  Patient can return home with the following Two people to help with walking and/or transfers;Two people to help with bathing/dressing/bathroom;Help with stairs or ramp for entrance;Assist for transportation;Assistance with cooking/housework   Equipment Recommendations  Other (comment) (to be determined)    Recommendations for Other Services       Precautions / Restrictions Precautions Precautions: Fall;Other (comment) Precaution Comments: multiple buttocks wounds     Mobility  Bed Mobility Overal bed mobility: Needs Assistance Bed Mobility: Rolling, Supine to Sit, Sit to  Sidelying Rolling: Min assist   Supine to sit: Mod assist, HOB elevated   Sit to sidelying: +2 for physical assistance, Mod assist General bed mobility comments: Assist to bring leg off of bed, elevate trunk into sitting, and bring hips to EOB. Assist to control descent of trunk and bring leg back up into bed.    Transfers                   General transfer comment: Pt declined maximove to chair    Ambulation/Gait                   Stairs             Wheelchair Mobility    Modified Rankin (Stroke Patients Only)       Balance Overall balance assessment: Needs assistance Sitting-balance support: Bilateral upper extremity supported, Feet unsupported, Single extremity supported, Feet supported Sitting balance-Leahy Scale: Poor Sitting balance - Comments: Pt sat EOB x 20 minutes. Pt able to sit EOB with min guard with UE support. Needs assist for dynamic activities. Pt performed reaching activities and trunk activation in sitting. Used Stedy in front of pt to allow rt foot to touch a surface and to provide pt with incr security to decr fear of falling.                                    Cognition Arousal/Alertness: Awake/alert Behavior During Therapy: Flat affect Overall Cognitive Status: No family/caregiver present to determine baseline cognitive functioning Area of Impairment: Orientation, Following commands, Safety/judgement, Attention, Memory, Awareness, Problem solving  Orientation Level: Time Current Attention Level: Selective Memory: Decreased short-term memory Following Commands: Follows one step commands consistently, Follows one step commands with increased time, Follows multi-step commands inconsistently Safety/Judgement: Decreased awareness of safety, Decreased awareness of deficits Awareness: Emergent Problem Solving: Slow processing, Requires verbal cues, Difficulty sequencing          Exercises  General Exercises - Lower Extremity Long Arc Quad: AROM, Right, Seated, 10 reps Other Exercises Other Exercises: Trunk rotation x 5 sitting EOB with BUE support Other Exercises: Reaching x 10 with each extremity sitting EOB Other Exercises: Trunk extension with anterior pelvic tilt x 5 Other Exercises: Anterior trunk lean x 5 sitting EOB    General Comments General comments (skin integrity, edema, etc.): VSS      Pertinent Vitals/Pain Pain Assessment Pain Assessment: Faces Faces Pain Scale: Hurts little more Pain Location: back Pain Descriptors / Indicators: Grimacing, Guarding, Discomfort Pain Intervention(s): Limited activity within patient's tolerance, Monitored during session, Repositioned    Home Living                          Prior Function            PT Goals (current goals can now be found in the care plan section) Progress towards PT goals: Progressing toward goals;Goals met and updated - see care plan    Frequency    Min 2X/week      PT Plan Current plan remains appropriate    Co-evaluation              AM-PAC PT "6 Clicks" Mobility   Outcome Measure  Help needed turning from your back to your side while in a flat bed without using bedrails?: A Lot Help needed moving from lying on your back to sitting on the side of a flat bed without using bedrails?: A Lot Help needed moving to and from a bed to a chair (including a wheelchair)?: Total Help needed standing up from a chair using your arms (e.g., wheelchair or bedside chair)?: Total Help needed to walk in hospital room?: Total Help needed climbing 3-5 steps with a railing? : Total 6 Click Score: 8    End of Session   Activity Tolerance: Patient tolerated treatment well Patient left: in bed;with call bell/phone within reach;with bed alarm set Nurse Communication: Mobility status;Need for lift equipment;Other (comment) (Purewick needed to be adjusted) PT Visit Diagnosis: Muscle  weakness (generalized) (M62.81);Other abnormalities of gait and mobility (R26.89)     Time: 9892-1194 PT Time Calculation (min) (ACUTE ONLY): 32 min  Charges:  $Therapeutic Activity: 23-37 mins                     [place] [facility] [place] 09/15/2022, 4:23 PM

## 2022-09-15 NOTE — Progress Notes (Signed)
PROGRESS NOTE  Heather [name] NWG:[id number] DOB: 09-Sep-1950   PCP: [name], FNP  Patient is from: Home  DOA: 08/04/2022 LOS: 60  Chief complaints No chief complaint on file.    Brief Narrative / Interim history: 72 year old F with PMH of DM-2, diastolic CHF, COPD, HTN, HLD, GERD, PAD, AS s/p TAVR, tobacco use disorder, anxiety, depression and schizophrenia presented to ED on 12/11 with SBP in 200s, dizziness, headache and blurry vision, and admitted to ICU for hemorrhagic cerebral stroke and BLE ischemia.  She underwent BLE femoral endarterectomy and patch angioplasty with iliac stenting and 4 compartment fasciotomies.  Postop, she remained on vent and Cleviprex.  Eventually, she was extubated and transferred to hospitalist service on 12/17.  She also underwent left AKA and excisional debridement of bilateral groin wounds with VAC placement on 12/21.  Hospital course and then noteworthy for episode of hypoglycemia with insulin adjustments.  Initially required tube feed that was later discontinued.  Remains physically deconditioned.  Wound VAC discontinued on 1/18.  She is now WTD dressing with silver to bilateral groins. Concern about persistent or worsening greenish drainage with tunneling per vascular surgery.  Plan for surgical debridement on 09/16/2022.  Remains on IV Ancef and Flagyl per ID.  Insurance has denied LTACH despite peer-to-peer review and approved SNF.  Seems p.o. intake is slowly improving.  She is also advanced to regular diet per SLP.  Subjective: Seen and examined earlier this morning.  No major events overnight of this morning.  She complains of nausea and back pain.   Objective: Vitals:   09/15/22 0600 09/15/22 0728 09/15/22 0808 09/15/22 0809  BP: (!) 136/58 (!) 156/62    Pulse:  71    Resp: 16 18    Temp: 98.4 F (36.9 C) 98.8 F (37.1 C)    TempSrc: Oral Oral    SpO2:  91% 92% 92%  Weight:      Height:        Examination:  GENERAL: No apparent  distress.  Nontoxic. HEENT: MMM.  Vision and hearing grossly intact.  NECK: Supple.  No apparent JVD.  RESP:  No IWOB.  Fair aeration bilaterally. CVS:  RRR. Heart sounds normal.  ABD/GI/GU: BS+. Abd soft, NTND.  MSK/EXT:  Moves extremities. No apparent deformity. No edema.  SKIN: Healed fasciotomy wound in RLE.  Healing left AKA wound.  New dressing over bilateral groins NEURO: Awake and alert. Oriented appropriately.  No apparent focal neuro deficit. PSYCH: Calm. Normal affect.    Microbiology summarized: 12/11-MRSA PCR screen negative. 12/21-surgical wound culture with pansensitive Proteus mirabilis and E. coli  Assessment and plan: Principal Problem:   PVD (peripheral vascular disease) ([place]) Active Problems:   Hypertension, benign   S/P TAVR (transcatheter aortic valve replacement)   Tobacco use disorder   PAD (peripheral artery disease) ([facility])   ICH (intracerebral hemorrhage) ([facility])   Abscess of anal and rectal regions   Decubitus ulcer of sacral region, stage 2 ([place])   Pulmonary edema   Right groin wound   Wound of left groin   Decreased oral intake   Chronic diastolic CHF (congestive heart failure) ([place])   Dysphagia   Surgical site infection  Peripheral arterial disease/bilateral lower extremity ischemia -Bilateral CFA endarterectomy with bovine patch angioplasty -Bilateral iliofemoral embolectomy, stenting on 12/11 -Bilateral CIA, stent Rt EIA and 4 compartment fasciotomies on 12/11 -Left AKA and excisional debridement of bilateral groin wounds with VAC placement 08/15/23 -Initial antibiotic plan, continue iv Ancef and po Flagyl x  6 wk from 08/14/22 per ID, Dr. [place] Bali and VVS -Concern about persistent or worsening greenish drainage with tunneling of left groin wound -Plan for surgical debridement and possible wound VAC placement on 09/16/2022 -Continue Plavix and statin. -Patient has left arm midline in place.  -Tylenol, oxycodone, Robaxin and Dilaudid for pain  control   Acute intracranial hemorrhage: MRI showed acute intraparenchymal hematoma in cerebellar vermis measuring 11 cc in volume and mild mass effect upon the fourth ventricle. Seen by neurology.  Neurologically stable   Acute respiratory failure with hypoxia COPD/tobacco use disorder: -Extubated 12/14> On room air currently. -Continue current Brovana Pulmicort,Yupleri and albuterol as needed.   Chronic diastolic CHF/HTN: TTE with LVEF of 60 to 65%, indeterminate DD.  Appears euvolemic on exam. -Continue Coreg, prn Hydralazine.  Uncontrolled NIDDM-2 with hyperglycemia and hypoglycemia: A1c 12.9% on 12/12.  CBG within acceptable range. Recent Labs  Lab 09/14/22 1629 09/14/22 2113 09/15/22 0602 09/15/22 0856 09/15/22 1044  GLUCAP 153* 183* 156* 167* 137*  -Continue current insulin regimen -Continue statin    Hx of CAD/HLD: Stable. -Continue Coreg, Lipitor and Plavix  AS s/p TAVR: Noted on stable.   Acute blood loss anemia in the setting of surgery: s/p 3 units PRBC so far. Hb stable Recent Labs    08/28/22 0340 08/29/22 0500 08/30/22 1133 08/31/22 0103 09/02/22 0709 09/03/22 0054 09/04/22 0315 09/05/22 0644 09/06/22 0617 09/08/22 0058  HGB 9.4* 8.0* 8.4* 8.1* 9.0* 8.9* 8.8* 11.9* 9.4* 9.8*  -Monitor intermittently.  Depression,bipolar,schizophrenia,insomnia, chronic back pain: mood has been stable. -Continue Lexapro, Remeron and trazodone -Pain medications as above.    Dysphagia:Likely related to [place]. Cortrack has been removed. She does not want to have NG tube or tube feeding. Family encouraged for oral nutrition.  Dysphagia seems to have resolved. -Upgraded to regular diet by speech  Increased nutrient needs/decreased oral intake: Body mass index is 26.95 kg/m. Nutrition Problem: Increased nutrient needs Etiology: wound healing Signs/Symptoms: estimated needs Interventions: Refer to RD note for recommendations  Pressure skin injury: POA Pressure Injury  08/04/22 Sacrum Stage 2 -  Partial thickness loss of dermis presenting as a shallow open injury with a red, pink wound bed without slough. (Active)  08/04/22 0100  Location: Sacrum  Location Orientation:   Staging: Stage 2 -  Partial thickness loss of dermis presenting as a shallow open injury with a red, pink wound bed without slough.  Wound Description (Comments):   Present on Admission: Yes  Dressing Type Foam - Lift dressing to assess site every shift 09/15/22 0935   DVT prophylaxis:  heparin injection 5,000 Units Start: 08/10/22 1600  Code Status: Full code Family Communication: None at bedside Level of care: Telemetry Medical Status is: Inpatient Remains inpatient appropriate because: Poor oral intake and insurance approval for SNF   Final disposition: SNF Consultants:  Vascular surgery Infectious disease Palliative medicine  35 minutes with more than 50% spent in reviewing records, counseling patient/family and coordinating care.   Sch Meds:  Scheduled Meds:  (feeding supplement) PROSource Plus  30 mL Oral BID BM   arformoterol  15 mcg Nebulization BID   atorvastatin  80 mg Oral Daily   budesonide (PULMICORT) nebulizer solution  0.5 mg Nebulization BID   carvedilol  12.5 mg Oral BID WC   Chlorhexidine Gluconate Cloth  6 each Topical Q0600   clopidogrel  75 mg Oral Daily   dronabinol  2.5 mg Oral BID AC   escitalopram  10 mg Oral Daily   Gerhardt's butt  cream   Topical BID   heparin injection (subcutaneous)  5,000 Units Subcutaneous Q8H   insulin aspart  0-6 Units Subcutaneous TID WC   insulin aspart  2 Units Subcutaneous TID WC   insulin glargine-yfgn  5 Units Subcutaneous Daily   metroNIDAZOLE  500 mg Oral Q12H   mirtazapine  7.5 mg Oral QHS   multivitamin with minerals  1 tablet Oral Daily   nicotine  14 mg Transdermal Daily   oxyCODONE  10 mg Oral Q12H   pantoprazole  40 mg Oral Daily   revefenacin  175 mcg Nebulization Daily   senna-docusate  1 tablet  Oral QHS   sodium chloride flush  10-40 mL Intracatheter Q12H   sodium chloride flush  10-40 mL Intracatheter Q12H   sodium hypochlorite   Irrigation Daily   Continuous Infusions:   ceFAZolin (ANCEF) IV 2 g (09/15/22 0330)   PRN Meds:.acetaminophen **OR** acetaminophen (TYLENOL) oral liquid 160 mg/5 mL **OR** acetaminophen, albuterol, hydrALAZINE, HYDROmorphone (DILAUDID) injection, methocarbamol, ondansetron (ZOFRAN) IV **OR** ondansetron, oxyCODONE, polyethylene glycol, sodium chloride flush, traZODone  Antimicrobials: Anti-infectives (From admission, onward)    Start     Dose/Rate Route Frequency Ordered Stop   09/09/22 1100  metroNIDAZOLE (FLAGYL) tablet 500 mg        500 mg Oral Every 12 hours 09/09/22 1002     08/20/22 2200  metroNIDAZOLE (FLAGYL) IVPB 500 mg  Status:  Discontinued        500 mg 100 mL/hr over 60 Minutes Intravenous Every 12 hours 08/20/22 1110 08/20/22 1111   08/20/22 2200  metroNIDAZOLE (FLAGYL) tablet 500 mg  Status:  Discontinued        500 mg Per Tube Every 12 hours 08/20/22 1111 09/09/22 1002   08/18/22 2300  ceFAZolin (ANCEF) IVPB 2g/100 mL premix        2 g 200 mL/hr over 30 Minutes Intravenous Every 8 hours 08/18/22 1455     08/18/22 2300  metroNIDAZOLE (FLAGYL) IVPB 500 mg  Status:  Discontinued        500 mg 100 mL/hr over 60 Minutes Intravenous Every 6 hours 08/18/22 1455 08/20/22 1110   08/15/22 1100  vancomycin (VANCOCIN) IVPB 1000 mg/200 mL premix  Status:  Discontinued        1,000 mg 200 mL/hr over 60 Minutes Intravenous Every 24 hours 08/14/22 1006 08/15/22 1307   08/14/22 1400  piperacillin-tazobactam (ZOSYN) IVPB 3.375 g  Status:  Discontinued        3.375 g 12.5 mL/hr over 240 Minutes Intravenous Every 8 hours 08/14/22 1006 08/18/22 1455   08/14/22 1100  vancomycin (VANCOREADY) IVPB 1500 mg/300 mL        1,500 mg 150 mL/hr over 120 Minutes Intravenous  Once 08/14/22 1006 08/14/22 1502   08/04/22 1045  ceFAZolin (ANCEF) IVPB 2g/100 mL  premix        2 g 200 mL/hr over 30 Minutes Intravenous On call to O.R. 08/04/22 4098 08/04/22 1100        I have personally reviewed the following labs and images: CBC: No results for input(s): "WBC", "NEUTROABS", "HGB", "HCT", "MCV", "PLT" in the last 168 hours.  BMP &GFR Recent Labs  Lab 09/15/22 0102  NA 134*  K 3.9  CL 103  CO2 27  GLUCOSE 160*  BUN 7*  CREATININE 0.67  CALCIUM 8.3*   Estimated Creatinine Clearance: 54.8 mL/min (by C-G formula based on SCr of 0.67 mg/dL). Liver & Pancreas: No results for input(s): "AST", "ALT", "ALKPHOS", "BILITOT", "  PROT", "ALBUMIN" in the last 168 hours. No results for input(s): "LIPASE", "AMYLASE" in the last 168 hours. No results for input(s): "AMMONIA" in the last 168 hours. Diabetic: No results for input(s): "HGBA1C" in the last 72 hours. Recent Labs  Lab 09/14/22 1629 09/14/22 2113 09/15/22 0602 09/15/22 0856 09/15/22 1044  GLUCAP 153* 183* 156* 167* 137*   Cardiac Enzymes: No results for input(s): "CKTOTAL", "CKMB", "CKMBINDEX", "TROPONINI" in the last 168 hours. No results for input(s): "PROBNP" in the last 8760 hours. Coagulation Profile: No results for input(s): "INR", "PROTIME" in the last 168 hours. Thyroid Function Tests: No results for input(s): "TSH", "T4TOTAL", "FREET4", "T3FREE", "THYROIDAB" in the last 72 hours. Lipid Profile: No results for input(s): "CHOL", "HDL", "LDLCALC", "TRIG", "CHOLHDL", "LDLDIRECT" in the last 72 hours. Anemia Panel: No results for input(s): "VITAMINB12", "FOLATE", "FERRITIN", "TIBC", "IRON", "RETICCTPCT" in the last 72 hours. Urine analysis:    Component Value Date/Time   COLORURINE YELLOW (A) 11/09/2020 1652   APPEARANCEUR CLOUDY (A) 11/09/2020 1652   APPEARANCEUR Clear 06/20/2012 2127   LABSPEC 1.017 11/09/2020 1652   LABSPEC 1.022 06/20/2012 2127   PHURINE 5.0 11/09/2020 1652   GLUCOSEU NEGATIVE 11/09/2020 1652   GLUCOSEU >=500 06/20/2012 2127   HGBUR NEGATIVE  11/09/2020 [place] NEGATIVE 11/09/2020 1652   BILIRUBINUR Negative 06/20/2012 2127   KETONESUR NEGATIVE 11/09/2020 1652   PROTEINUR NEGATIVE 11/09/2020 1652   NITRITE NEGATIVE 11/09/2020 1652   LEUKOCYTESUR MODERATE (A) 11/09/2020 1652   LEUKOCYTESUR Negative 06/20/2012 2127   Sepsis Labs: Invalid input(s): "PROCALCITONIN", "LACTICIDVEN"  Microbiology: No results found for this or any previous visit (from the past 240 hour(s)).  Radiology Studies: No results found.    Heather [name] T. [place]  If 7PM-7AM, please contact night-coverage [email or web address] 09/15/2022, 2:25 PM

## 2022-09-15 NOTE — Progress Notes (Signed)
[facility] for Infectious Disease  Date of Admission:  08/04/2022      Total days of antibiotics 42   Cefazolin           ASSESSMENT: Heather [name] is a 72 y.o. female admitted 08/05/2023 with ICH in cerebellar vermis requiring ICU management. High suspicion for critical limb ischemia in the setting of known PAD - Dr. Donzetta Matters with vascular team on board throughout hospital stay. 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 Initial cultures from the right groin revealed Proteus mirabilis, e coli and multiple bacteroides species (BL+). Over the last few days into the weekend, noted that the left goin has developed purulent drainage again that has a blue/green tint to it despite Dakins solution. The plan will be to take her back to the OR for further debridment and cultures tomorrow. Will then put her on cefepime empirically pending the culture results. Given stability of the patient and upcoming plan for intervention, would prefer to hold on change now so we don't impact culture yield.      PLAN: Continue cefazolin + metronidazole for now After surgery samples taken, will change to cefepime + metronidazole for empiric pseudomonas coverage.  Duration of psa coverage pending OR observation and depth of wound.      Principal Problem:   PVD (peripheral vascular disease) ([facility]) Active Problems:   Hypertension, benign   S/P TAVR (transcatheter aortic valve replacement)   Tobacco use disorder   PAD (peripheral artery disease) (HCC)   ICH (intracerebral hemorrhage) (HCC)   Abscess of anal and rectal regions   Decubitus ulcer of sacral region, stage 2 (HCC)   Pulmonary edema   Right groin wound   Wound of left groin   Decreased oral intake   Chronic diastolic CHF (congestive heart failure) (HCC)    Dysphagia    (feeding supplement) PROSource Plus  30 mL Oral BID BM   arformoterol  15 mcg Nebulization BID   atorvastatin  80 mg Oral Daily   budesonide (PULMICORT) nebulizer solution  0.5 mg Nebulization BID   carvedilol  12.5 mg Oral BID WC   Chlorhexidine Gluconate Cloth  6 each Topical Q0600   clopidogrel  75 mg Oral Daily   dronabinol  2.5 mg Oral BID AC   escitalopram  10 mg Oral Daily   Gerhardt's butt cream   Topical BID   heparin injection (subcutaneous)  5,000 Units Subcutaneous Q8H   insulin aspart  0-6 Units Subcutaneous TID WC   insulin aspart  2 Units Subcutaneous TID WC   insulin glargine-yfgn  5 Units Subcutaneous Daily   metroNIDAZOLE  500 mg Oral Q12H   mirtazapine  7.5 mg Oral QHS   multivitamin with minerals  1 tablet Oral Daily   nicotine  14 mg Transdermal Daily   oxyCODONE  10 mg Oral Q12H   pantoprazole  40 mg Oral Daily   revefenacin  175 mcg Nebulization Daily   senna-docusate  1 tablet Oral QHS   sodium chloride flush  10-40 mL Intracatheter Q12H   sodium chloride flush  10-40 mL Intracatheter Q12H   sodium hypochlorite   Irrigation Daily    SUBJECTIVE: "When can I go home."  No groin pain. No fevers/chills. Has back pain from the bed mattress.    Review of Systems: Review of Systems  Constitutional:  Negative for chills, fever and malaise/fatigue.  Gastrointestinal:  Negative for abdominal pain, diarrhea, nausea and vomiting.  Musculoskeletal:  Negative for joint pain.  Skin:  Negative for rash.     Allergies  Allergen Reactions   Iodinated Contrast Media Hives   Sulfa Antibiotics Hives   Shellfish Allergy Nausea And Vomiting, Rash and Other (See Comments)    Scallops specifically cause VOMITING      OBJECTIVE: Vitals:   09/15/22 0600 09/15/22 0728 09/15/22 0808 09/15/22 0809  BP: (!) 136/58 (!) 156/62    Pulse:  71    Resp: 16 18    Temp: 98.4 F (36.9 C) 98.8 F (37.1 C)    TempSrc: Oral Oral    SpO2:  91% 92% 92%   Weight:      Height:       Body mass index is 26.95 kg/m.  Physical Exam Vitals reviewed.  Constitutional:      Appearance: Normal appearance. She is not ill-appearing.  Cardiovascular:     Rate and Rhythm: Normal rate and regular rhythm.  Pulmonary:     Effort: Pulmonary effort is normal.  Abdominal:     General: There is no distension.     Palpations: Abdomen is soft.  Musculoskeletal:     Comments: Left groin dressing removed - there is a bit of blue/green tinged drainage on the gauze. No strong odor. Peri wound without much inflammation. Not much granulation in wound bed. Sinus tract observed.   Neurological:     Mental Status: She is alert and oriented to person, place, and time.     Lab Results Lab Results  Component Value Date   WBC 5.5 09/08/2022   HGB 9.8 (L) 09/08/2022   HCT 30.6 (L) 09/08/2022   MCV 91.6 09/08/2022   PLT 377 09/08/2022    Lab Results  Component Value Date   CREATININE 0.67 09/15/2022   BUN 7 (L) 09/15/2022   NA 134 (L) 09/15/2022   K 3.9 09/15/2022   CL 103 09/15/2022   CO2 27 09/15/2022    Lab Results  Component Value Date   ALT 6 08/28/2022   AST 19 08/28/2022   ALKPHOS 101 08/28/2022   BILITOT 0.3 08/28/2022     Microbiology: No results found for this or any previous visit (from the past 240 hour(s)).  Janene Madeira, MSN, NP-C [facility] [place] Pager: [phone]  09/15/2022  12:37 PM

## 2022-09-15 NOTE — Progress Notes (Signed)
Orthopedic Tech Progress Note Patient Details:  Heather [name] 05-Jul-1951 [id number]  Called in order to [employer] for a RETENTION SOCK   Patient ID: Heather [name], female   DOB: 1951-06-28, 72 y.o.   MRN: [id number]  Janit Pagan 09/15/2022, 9:00 AM

## 2022-09-16 ENCOUNTER — Encounter (HOSPITAL_COMMUNITY)
Admission: EM | Disposition: A | Discharge status: Skilled Nursing Facility | Payer: Self-pay | Source: Other Acute Inpatient Hospital | Attending: Internal Medicine

## 2022-09-16 ENCOUNTER — Inpatient Hospital Stay (HOSPITAL_COMMUNITY): Payer: Medicare HMO | Admitting: Anesthesiology

## 2022-09-16 ENCOUNTER — Encounter (HOSPITAL_COMMUNITY): Payer: Self-pay | Admitting: Neurology

## 2022-09-16 ENCOUNTER — Other Ambulatory Visit: Payer: Self-pay

## 2022-09-16 DIAGNOSIS — F17210 Nicotine dependence, cigarettes, uncomplicated: Secondary | ICD-10-CM

## 2022-09-16 DIAGNOSIS — I252 Old myocardial infarction: Secondary | ICD-10-CM

## 2022-09-16 DIAGNOSIS — I110 Hypertensive heart disease with heart failure: Secondary | ICD-10-CM

## 2022-09-16 DIAGNOSIS — I509 Heart failure, unspecified: Secondary | ICD-10-CM | POA: Diagnosis not present

## 2022-09-16 DIAGNOSIS — I2510 Atherosclerotic heart disease of native coronary artery without angina pectoris: Secondary | ICD-10-CM | POA: Diagnosis not present

## 2022-09-16 DIAGNOSIS — R638 Other symptoms and signs concerning food and fluid intake: Secondary | ICD-10-CM | POA: Diagnosis not present

## 2022-09-16 DIAGNOSIS — I5032 Chronic diastolic (congestive) heart failure: Secondary | ICD-10-CM | POA: Diagnosis not present

## 2022-09-16 DIAGNOSIS — J449 Chronic obstructive pulmonary disease, unspecified: Secondary | ICD-10-CM

## 2022-09-16 DIAGNOSIS — K612 Anorectal abscess: Secondary | ICD-10-CM | POA: Diagnosis not present

## 2022-09-16 DIAGNOSIS — I739 Peripheral vascular disease, unspecified: Secondary | ICD-10-CM | POA: Diagnosis not present

## 2022-09-16 DIAGNOSIS — S31109A Unspecified open wound of abdominal wall, unspecified quadrant without penetration into peritoneal cavity, initial encounter: Secondary | ICD-10-CM

## 2022-09-16 HISTORY — PX: GROIN DEBRIDEMENT: SHX5159

## 2022-09-16 LAB — GLUCOSE, CAPILLARY
Glucose-Capillary: 176 mg/dL — ABNORMAL HIGH (ref 70–99)
Glucose-Capillary: 137 mg/dL — ABNORMAL HIGH (ref 70–99)
Glucose-Capillary: 160 mg/dL — ABNORMAL HIGH (ref 70–99)
Glucose-Capillary: 144 mg/dL — ABNORMAL HIGH (ref 70–99)
Glucose-Capillary: 213 mg/dL — ABNORMAL HIGH (ref 70–99)
Glucose-Capillary: 295 mg/dL — ABNORMAL HIGH (ref 70–99)
Glucose-Capillary: 310 mg/dL — ABNORMAL HIGH (ref 70–99)

## 2022-09-16 LAB — SURGICAL PCR SCREEN
MRSA, PCR: NEGATIVE
Staphylococcus aureus: NEGATIVE

## 2022-09-16 SURGERY — DEBRIDEMENT, INGUINAL REGION
Anesthesia: General | Laterality: Bilateral

## 2022-09-16 MED ORDER — CARVEDILOL 6.25 MG PO TABS
6.2500 mg | ORAL_TABLET | Freq: Two times a day (BID) | ORAL | Status: DC
  Administered 2022-09-16 – 2022-09-22 (×12): 6.25 mg via ORAL
  Filled 2022-09-16 (×13): qty 1

## 2022-09-16 MED ORDER — ORAL CARE MOUTH RINSE: 15.0000 mL | Freq: Once | OROMUCOSAL | Status: AC

## 2022-09-16 MED ORDER — CHLORHEXIDINE GLUCONATE 0.12 % MT SOLN
15.0000 mL | Freq: Once | OROMUCOSAL | Status: AC
  Administered 2022-09-16: 15 mL via OROMUCOSAL
  Filled 2022-09-16: qty 15

## 2022-09-16 MED ORDER — 0.9 % SODIUM CHLORIDE (POUR BTL) OPTIME
TOPICAL | Status: DC | PRN
  Administered 2022-09-16: 2000 mL

## 2022-09-16 MED ORDER — ONDANSETRON HCL 4 MG/2ML IJ SOLN
INTRAMUSCULAR | Status: DC | PRN
  Administered 2022-09-16: 4 mg via INTRAVENOUS

## 2022-09-16 MED ORDER — LIDOCAINE 2% (20 MG/ML) 5 ML SYRINGE
INTRAMUSCULAR | Status: AC
  Filled 2022-09-16: qty 5

## 2022-09-16 MED ORDER — FENTANYL CITRATE (PF) 250 MCG/5ML IJ SOLN
INTRAMUSCULAR | Status: AC
  Filled 2022-09-16: qty 5

## 2022-09-16 MED ORDER — PHENYLEPHRINE HCL-NACL 20-0.9 MG/250ML-% IV SOLN
INTRAVENOUS | Status: DC | PRN
  Administered 2022-09-16: 25 ug/min via INTRAVENOUS

## 2022-09-16 MED ORDER — PROPOFOL 10 MG/ML IV BOLUS
INTRAVENOUS | Status: AC
  Filled 2022-09-16: qty 20

## 2022-09-16 MED ORDER — PHENYLEPHRINE 80 MCG/ML (10ML) SYRINGE FOR IV PUSH (FOR BLOOD PRESSURE SUPPORT)
PREFILLED_SYRINGE | INTRAVENOUS | Status: AC
  Filled 2022-09-16: qty 10

## 2022-09-16 MED ORDER — SODIUM CHLORIDE 0.9 % IV SOLN: INTRAVENOUS | Status: DC | PRN

## 2022-09-16 MED ORDER — LACTATED RINGERS IV SOLN: INTRAVENOUS | Status: DC

## 2022-09-16 MED ORDER — EPHEDRINE SULFATE-NACL 50-0.9 MG/10ML-% IV SOSY
PREFILLED_SYRINGE | INTRAVENOUS | Status: DC | PRN
  Administered 2022-09-16: 5 mg via INTRAVENOUS

## 2022-09-16 MED ORDER — SODIUM CHLORIDE 0.9 % IV SOLN
2.0000 g | Freq: Three times a day (TID) | INTRAVENOUS | Status: DC
  Administered 2022-09-16 – 2022-09-22 (×17): 2 g via INTRAVENOUS
  Filled 2022-09-16 (×19): qty 12.5

## 2022-09-16 MED ORDER — MUPIROCIN 2 % EX OINT
TOPICAL_OINTMENT | CUTANEOUS | Status: AC
  Filled 2022-09-16: qty 22

## 2022-09-16 MED ORDER — PROPOFOL 10 MG/ML IV BOLUS
INTRAVENOUS | Status: DC | PRN
  Administered 2022-09-16: 70 mg via INTRAVENOUS

## 2022-09-16 MED ORDER — LIDOCAINE 2% (20 MG/ML) 5 ML SYRINGE
INTRAMUSCULAR | Status: DC | PRN
  Administered 2022-09-16: 60 mg via INTRAVENOUS

## 2022-09-16 MED ORDER — DEXAMETHASONE SODIUM PHOSPHATE 10 MG/ML IJ SOLN
INTRAMUSCULAR | Status: DC | PRN
  Administered 2022-09-16: 5 mg via INTRAVENOUS

## 2022-09-16 MED ORDER — LABETALOL HCL 5 MG/ML IV SOLN
INTRAVENOUS | Status: AC
  Filled 2022-09-16: qty 4

## 2022-09-16 MED ORDER — ONDANSETRON HCL 4 MG/2ML IJ SOLN
INTRAMUSCULAR | Status: AC
  Filled 2022-09-16: qty 2

## 2022-09-16 MED ORDER — FENTANYL CITRATE (PF) 100 MCG/2ML IJ SOLN
INTRAMUSCULAR | Status: AC
  Filled 2022-09-16: qty 2

## 2022-09-16 MED ORDER — PROTAMINE SULFATE 10 MG/ML IV SOLN
INTRAVENOUS | Status: AC
  Filled 2022-09-16: qty 5

## 2022-09-16 MED ORDER — ROCURONIUM BROMIDE 10 MG/ML (PF) SYRINGE
PREFILLED_SYRINGE | INTRAVENOUS | Status: AC
  Filled 2022-09-16: qty 10

## 2022-09-16 MED ORDER — FENTANYL CITRATE (PF) 100 MCG/2ML IJ SOLN
25.0000 ug | INTRAMUSCULAR | Status: DC | PRN
  Administered 2022-09-16 (×3): 50 ug via INTRAVENOUS

## 2022-09-16 MED ORDER — FENTANYL CITRATE (PF) 250 MCG/5ML IJ SOLN
INTRAMUSCULAR | Status: DC | PRN
  Administered 2022-09-16: 25 ug via INTRAVENOUS
  Administered 2022-09-16: 50 ug via INTRAVENOUS
  Administered 2022-09-16: 25 ug via INTRAVENOUS

## 2022-09-16 MED ORDER — DEXAMETHASONE SODIUM PHOSPHATE 10 MG/ML IJ SOLN
INTRAMUSCULAR | Status: AC
  Filled 2022-09-16: qty 1

## 2022-09-16 MED ORDER — GLYCOPYRROLATE 0.2 MG/ML IJ SOLN
INTRAMUSCULAR | Status: DC | PRN
  Administered 2022-09-16: .2 mg via INTRAVENOUS

## 2022-09-16 SURGICAL SUPPLY — 46 items
BAG COUNTER SPONGE SURGICOUNT (BAG) ×1 IMPLANT
CANISTER SUCT 3000ML PPV (MISCELLANEOUS) ×1 IMPLANT
CANISTER WOUND CARE 500ML ATS (WOUND CARE) IMPLANT
CLIP LIGATING EXTRA MED SLVR (CLIP) IMPLANT
CLIP LIGATING EXTRA SM BLUE (MISCELLANEOUS) IMPLANT
CONNECTOR Y ATS VAC SYSTEM (MISCELLANEOUS) IMPLANT
COVER SURGICAL LIGHT HANDLE (MISCELLANEOUS) IMPLANT
DRAPE ORTHO SPLIT 77X108 STRL (DRAPES) ×1
DRAPE SURG ORHT 6 SPLT 77X108 (DRAPES) IMPLANT
DRESSING VERAFLO CLEANS CC MED (GAUZE/BANDAGES/DRESSINGS) IMPLANT
DRSG VAC ATS MED SENSATRAC (GAUZE/BANDAGES/DRESSINGS) IMPLANT
DRSG VAC ATS SM SENSATRAC (GAUZE/BANDAGES/DRESSINGS) IMPLANT
DRSG VERAFLO CLEANSE CC MED (GAUZE/BANDAGES/DRESSINGS) ×1
ELECT REM PT RETURN 9FT ADLT (ELECTROSURGICAL) ×1
ELECTRODE REM PT RTRN 9FT ADLT (ELECTROSURGICAL) ×1 IMPLANT
GAUZE SPONGE 4X4 12PLY STRL (GAUZE/BANDAGES/DRESSINGS) ×1 IMPLANT
GLOVE BIO SURGEON STRL SZ 6 (GLOVE) IMPLANT
GLOVE BIOGEL PI IND STRL 6 (GLOVE) IMPLANT
GLOVE BIOGEL PI IND STRL 7.5 (GLOVE) IMPLANT
GLOVE BIOGEL PI IND STRL 8 (GLOVE) ×1 IMPLANT
GLOVE ECLIPSE 7.0 STRL STRAW (GLOVE) IMPLANT
GOWN STRL REUS W/ TWL LRG LVL3 (GOWN DISPOSABLE) ×2 IMPLANT
GOWN STRL REUS W/TWL 2XL LVL3 (GOWN DISPOSABLE) ×2 IMPLANT
GOWN STRL REUS W/TWL LRG LVL3 (GOWN DISPOSABLE) ×2
HEMOSTAT SNOW SURGICEL 2X4 (HEMOSTASIS) IMPLANT
KIT BASIN OR (CUSTOM PROCEDURE TRAY) ×1 IMPLANT
KIT TURNOVER KIT B (KITS) ×1 IMPLANT
NS IRRIG 1000ML POUR BTL (IV SOLUTION) ×1 IMPLANT
PACK GENERAL/GYN (CUSTOM PROCEDURE TRAY) ×1 IMPLANT
PACK UNIVERSAL I (CUSTOM PROCEDURE TRAY) ×1 IMPLANT
PAD ARMBOARD 7.5X6 YLW CONV (MISCELLANEOUS) ×2 IMPLANT
PAD NEG PRESSURE SENSATRAC (MISCELLANEOUS) IMPLANT
POWDER MYRIAD MORCELLS 1000MG (Miscellaneous) IMPLANT
POWDER MYRIAD MORCELLS 500MG (Miscellaneous) IMPLANT
STAPLER VISISTAT 35W (STAPLE) IMPLANT
SUT ETHILON 3 0 PS 1 (SUTURE) IMPLANT
SUT MNCRL AB 4-0 PS2 18 (SUTURE) IMPLANT
SUT SILK 2 0 TIES 10X30 (SUTURE) IMPLANT
SUT SILK 3 0 (SUTURE) ×1
SUT SILK 3-0 18XBRD TIE 12 (SUTURE) IMPLANT
SUT VIC AB 2-0 CTB1 (SUTURE) IMPLANT
SUT VIC AB 3-0 SH 27 (SUTURE)
SUT VIC AB 3-0 SH 27X BRD (SUTURE) IMPLANT
TOWEL GREEN STERILE (TOWEL DISPOSABLE) ×1 IMPLANT
TUBE CONNECTING 20X1/4 (TUBING) IMPLANT
WATER STERILE IRR 1000ML POUR (IV SOLUTION) ×1 IMPLANT

## 2022-09-16 NOTE — Transfer of Care (Signed)
Immediate Anesthesia Transfer of Care Note  Patient: Heather [name]  Procedure(s) Performed: [name] DEBRIDEMENT POSSIBLE VAC PLACMENT (Bilateral)  Patient Location: PACU  Anesthesia Type:General  Level of Consciousness: awake, alert , and oriented  Airway & Oxygen Therapy: Patient Spontanous Breathing and Patient connected to nasal cannula oxygen  Post-op Assessment: Report given to RN and Post -op Vital signs reviewed and stable  Post vital signs: Reviewed and stable  Last Vitals:  Vitals Value Taken Time  BP 151/92 09/16/22 1145  Temp    Pulse 85 09/16/22 1151  Resp 10 09/16/22 1151  SpO2 99 % 09/16/22 1151  Vitals shown include unvalidated device data.  Last Pain:  Vitals:   09/16/22 0838  TempSrc: Oral  PainSc:       Patients Stated Pain Goal: 0 (84/16/60 6301)  Complications: No notable events documented.

## 2022-09-16 NOTE — Anesthesia Preprocedure Evaluation (Addendum)
Anesthesia Evaluation  Patient identified by MRN, date of birth, ID band Patient awake    Reviewed: Allergy & Precautions, H&P , NPO status , Patient's Chart, lab work & pertinent test results  Airway Mallampati: III  TM Distance: >3 FB Neck ROM: Full    Dental no notable dental hx. (+) Edentulous Upper, Edentulous Lower, Dental Advisory Given   Pulmonary COPD,  COPD inhaler, Current Smoker and Patient abstained from smoking.   Pulmonary exam normal breath sounds clear to auscultation       Cardiovascular hypertension, Pt. on medications + CAD, + Past MI, + Cardiac Stents, + Peripheral Vascular Disease and +CHF   Rhythm:Regular Rate:Normal  S/p TAVR   Neuro/Psych   Anxiety Depression Bipolar Disorder   negative neurological ROS     GI/Hepatic Neg liver ROS,GERD  Medicated,,  Endo/Other  diabetes, Oral Hypoglycemic Agents, Insulin Dependent    Renal/GU negative Renal ROS  negative genitourinary   Musculoskeletal  (+) Arthritis , Osteoarthritis,    Abdominal   Peds  Hematology negative hematology ROS (+)   Anesthesia Other Findings   Reproductive/Obstetrics negative OB ROS                             Anesthesia Physical Anesthesia Plan  ASA: 3  Anesthesia Plan: General   Post-op Pain Management: Ofirmev IV (intra-op)*   Induction: Intravenous  PONV Risk Score and Plan: 3 and Ondansetron, Dexamethasone and Treatment may vary due to age or medical condition  Airway Management Planned: LMA  Additional Equipment:   Intra-op Plan:   Post-operative Plan: Extubation in OR  Informed Consent: I have reviewed the patients History and Physical, chart, labs and discussed the procedure including the risks, benefits and alternatives for the proposed anesthesia with the patient or authorized representative who has indicated his/her understanding and acceptance.     Dental advisory  given  Plan Discussed with: CRNA  Anesthesia Plan Comments:        Anesthesia Quick Evaluation

## 2022-09-16 NOTE — Progress Notes (Signed)
  Progress Note    09/16/2022 10:34 AM Day of Surgery  Subjective:  resting comfortably    Vitals:   09/16/22 0811 09/16/22 0838  BP: (!) 140/53 124/66  Pulse:  63  Resp:  17  Temp: 98.4 F (36.9 C) 97.9 F (36.6 C)  SpO2:  94%    Physical Exam: Lungs:  nonlabored Incisions:  R groin wound healing appropriately with serous drainage. L groin wound with continued green drainage and tunneling. L AKA healing appropriately, staples have been removed Extremities:  R DP/PT doppler signals   CBC    Component Value Date/Time   WBC 5.5 09/08/2022 0058   RBC 3.34 (L) 09/08/2022 0058   HGB 9.8 (L) 09/08/2022 0058   HGB 14.3 06/20/2012 2127   HCT 30.6 (L) 09/08/2022 0058   HCT 42.9 06/20/2012 2127   PLT 377 09/08/2022 0058   PLT 357 06/20/2012 2127   MCV 91.6 09/08/2022 0058   MCV 91 06/20/2012 2127   MCH 29.3 09/08/2022 0058   MCHC 32.0 09/08/2022 0058   RDW 15.6 (H) 09/08/2022 0058   RDW 13.6 06/20/2012 2127   LYMPHSABS 1.3 08/31/2022 0103   MONOABS 0.9 08/31/2022 0103   EOSABS 0.3 08/31/2022 0103   BASOSABS 0.1 08/31/2022 0103    BMET    Component Value Date/Time   NA 134 (L) 09/15/2022 0102   NA 135 (L) 06/20/2012 2127   K 3.9 09/15/2022 0102   K 4.0 06/20/2012 2127   CL 103 09/15/2022 0102   CL 100 06/20/2012 2127   CO2 27 09/15/2022 0102   CO2 27 06/20/2012 2127   GLUCOSE 160 (H) 09/15/2022 0102   GLUCOSE 250 (H) 06/20/2012 2127   BUN 7 (L) 09/15/2022 0102   BUN 20 (H) 06/20/2012 2127   CREATININE 0.67 09/15/2022 0102   CREATININE 0.60 06/20/2012 2127   CALCIUM 8.3 (L) 09/15/2022 0102   CALCIUM 8.8 06/20/2012 2127   GFRNONAA >60 09/15/2022 0102   GFRNONAA >60 06/20/2012 2127   GFRAA >60 06/20/2012 2127    INR    Component Value Date/Time   INR 1.0 08/03/2022 2156     Intake/Output Summary (Last 24 hours) at 09/16/2022 1034 Last data filed at 09/15/2022 1052 Gross per 24 hour  Intake --  Output 500 ml  Net -500 ml        Assessment/Plan:  72 y.o. female is 32 days post op,s/p:  bilateral lower extremity revascularization with groin debridement and L AKA    Patient continues to have difficulty with bilateral groin wound healing.  Some tunneling appreciated.  Cultures growing Pseudomonas.  Plan for bilateral groin debridement, VAC change.  Will explore in a tunnel track appreciated. Discussed with Dr. Donzetta Matters and patient.  Today will be debridement, and should any patch need to be removed, patient asked that we discussed things first.  Broadus John MD [facility] [phone] 09/16/2022 10:34 AM

## 2022-09-16 NOTE — Anesthesia Procedure Notes (Signed)
Procedure Name: LMA Insertion Date/Time: 09/16/2022 10:54 AM  Performed by: Mariea Clonts, CRNAPre-anesthesia Checklist: Patient identified, Emergency Drugs available, Suction available and Patient being monitored Patient Re-evaluated:Patient Re-evaluated prior to induction Oxygen Delivery Method: Circle System Utilized Preoxygenation: Pre-oxygenation with 100% oxygen Induction Type: IV induction Ventilation: Mask ventilation without difficulty LMA: LMA inserted LMA Size: 3.0 Number of attempts: 1 Airway Equipment and Method: Bite block Placement Confirmation: positive ETCO2 Tube secured with: Tape Dental Injury: Teeth and Oropharynx as per pre-operative assessment

## 2022-09-16 NOTE — Anesthesia Postprocedure Evaluation (Signed)
Anesthesia Post Note  Patient: Heather [name]  Procedure(s) Performed: [name] DEBRIDEMENT POSSIBLE VAC PLACMENT (Bilateral)     Patient location during evaluation: PACU Anesthesia Type: General Level of consciousness: awake and alert Pain management: pain level controlled Vital Signs Assessment: post-procedure vital signs reviewed and stable Respiratory status: spontaneous breathing, nonlabored ventilation, respiratory function stable and patient connected to nasal cannula oxygen Cardiovascular status: blood pressure returned to baseline and stable Postop Assessment: no apparent nausea or vomiting Anesthetic complications: no  No notable events documented.  Last Vitals:  Vitals:   09/16/22 1215 09/16/22 1238  BP: 137/61 130/63  Pulse: 81 72  Resp: 12   Temp:  36.6 C  SpO2: 91% 92%    Last Pain:  Vitals:   09/16/22 1302  TempSrc:   PainSc: 8                  Jayron Maqueda,W. EDMOND

## 2022-09-16 NOTE — Op Note (Signed)
    NAME: Heather [name]    MRN: [id number] DOB: [birth year]    DATE OF OPERATION: 09/16/2022  PREOP DIAGNOSIS:    Bilateral groin wounds  POSTOP DIAGNOSIS:    Same  PROCEDURE:    Bilateral groin wound washout Soft tissue debridement Myriad Morcell powder placement 2500g VAC dressing placement   SURGEON: Broadus John  ASSIST: Roxy Horseman, PA  ANESTHESIA: General   EBL: 72m  INDICATIONS:    Heather Anderssonis a 72y.o. female with history of bilateral groin cutdown, endarterectomy with bilateral lower extremity revascularization. She has had poor wound healing, with recent cultures demonstrating Pseudomonas. After discussing the risks and benefits of bilateral groin exploration, to ensure there is no patch involvement, Heather [name] to proceed.   FINDINGS:    No patch involvement left groin with 8cmx8cm defect. Right groin with 3cm defect.  TECHNIQUE:   Pt was brought to the OR and laid in the supine position. General anesthesia was induced and pt was prepped and draped in standard fashion.   The case began with digital palpation of both wounds. There were tunneling tracts bilaterally. These were opened leading to cavities - see sizes above. There was no tunneling to the arteries.  Bilateral  wounds were irrigated with copious amounts saline. Myriad morcell powder was used to promote wound healing.   Vacuum assisted dressings were placed in bilateral groins and the pt was taken to PACU in stable condition.   JMacie Burows MD [facility]ATION:   09/16/2022

## 2022-09-16 NOTE — Progress Notes (Signed)
PROGRESS NOTE  Heather [name] QPR:[id number] DOB: 1951/05/28   PCP: [name], FNP  Patient is from: Home  DOA: 08/04/2022 LOS: 44  Chief complaints No chief complaint on file.    Brief Narrative / Interim history: 72 year old F with PMH of DM-2, diastolic CHF, COPD, HTN, HLD, GERD, PAD, AS s/p TAVR, tobacco use disorder, anxiety, depression and schizophrenia presented to ED on 12/11 with SBP in 200s, dizziness, headache and blurry vision, and admitted to ICU for hemorrhagic cerebral stroke and BLE ischemia.  She underwent BLE femoral endarterectomy and patch angioplasty with iliac stenting and 4 compartment fasciotomies.  Postop, she remained on vent and Cleviprex.  Eventually, she was extubated and transferred to hospitalist service on 12/17.  She also underwent left AKA and excisional debridement of bilateral groin wounds with VAC placement on 12/21.  Hospital course and then noteworthy for episode of hypoglycemia with insulin adjustments.  Initially required tube feed that was later discontinued.  Remains physically deconditioned.  Wound VAC discontinued on 1/18.  She is now WTD dressing with silver to bilateral groins. Concern about persistent or worsening greenish drainage with tunneling.  Underwent groin debridement and wound VAC placement on 1/23.  Antibiotics broadened to IV cefepime and Flagyl per ID and VVS.  Subjective: Seen and examined this afternoon after she returned from surgery.  No major events overnight of this morning.  No complaints other than feeling cold.   Objective: Vitals:   09/16/22 1204 09/16/22 1209 09/16/22 1215 09/16/22 1238  BP:   137/61 130/63  Pulse: 85 82 81 72  Resp: '12 10 12   '$ Temp:    97.9 F (36.6 C)  TempSrc:    Oral  SpO2: 94% 92% 91% 92%  Weight:      Height:        Examination:  GENERAL: No apparent distress.  Nontoxic. HEENT: MMM.  Vision and hearing grossly intact.  NECK: Supple.  No apparent JVD.  RESP:  No IWOB.  Fair aeration  bilaterally. CVS:  RRR. Heart sounds normal.  ABD/GI/GU: BS+. Abd soft, NTND.  MSK/EXT: Left AKA.  Wound VAC over bilateral groin. SKIN: Wound VAC as above.  Fasciotomy wounds healed. NEURO: Awake and alert. Oriented appropriately.  No apparent focal neuro deficit. PSYCH: Calm. Normal affect.    Microbiology summarized: 12/11-MRSA PCR screen negative. 12/21-surgical wound culture with Bacteroides, pansensitive Proteus mirabilis, E. coli  Assessment and plan: Principal Problem:   PVD (peripheral vascular disease) ([place]) Active Problems:   Hypertension, benign   S/P TAVR (transcatheter aortic valve replacement)   Tobacco use disorder   PAD (peripheral artery disease) (H[facility])   ICH (intracerebral hemorrhage) (H[facility])   Abscess of anal and rectal regions   Decubitus ulcer of sacral region, stage 2 ([place])   Pulmonary edema   Right groin wound   Wound of left groin   Decreased oral intake   Chronic diastolic CHF (congestive heart failure) ([place])   Dysphagia   Surgical site infection  Peripheral arterial disease/bilateral lower extremity ischemia -Bilateral CFA endarterectomy with bovine patch angioplasty -Bilateral iliofemoral embolectomy, stenting on 12/11 -Bilateral CIA, stent Rt EIA and 4 compartment fasciotomies on 12/11 -Left AKA and excisional debridement of bilateral groin wounds with VAC placement 08/15/23 -1/21-concern about persistent or worsening greenish drainage with tunneling of left groin wound -1/23 hide from surgical debridement and wound VAC placement  -1/23-antibiotics broadened to IV cefepime and Flagyl per recommendation by ID and VVS. -Continue Plavix and statin. -Patient has left arm midline in  place.  -Tylenol, oxycodone, Robaxin and Dilaudid for pain control   Acute intracranial hemorrhage: MRI showed acute intraparenchymal hematoma in cerebellar vermis measuring 11 cc in volume and mild mass effect upon the fourth ventricle. Seen by neurology.  Neurologically  stable   Acute respiratory failure with hypoxia COPD/tobacco use disorder: -Extubated 12/14> On room air currently. -Continue current Brovana Pulmicort,Yupleri and albuterol as needed.   Chronic diastolic CHF/HTN: TTE with LVEF of 60 to 65%, indeterminate DD.  Appears euvolemic on exam. -Continue Coreg.  Uncontrolled NIDDM-2 with hyperglycemia and hypoglycemia: A1c 12.9% on 12/12.  CBG within acceptable range. Recent Labs  Lab 09/16/22 0557 09/16/22 0835 09/16/22 0908 09/16/22 1148 09/16/22 1238  GLUCAP 176* 160* 137* 144* 213*  -Continue current insulin regimen -Continue statin  Essential hypertension: Mild hypotension earlier this morning. -Decreased Coreg to 6.25 mg twice daily    Hx of CAD/HLD: Stable. -Continue Coreg, Lipitor and Plavix  AS s/p TAVR: Noted on stable.   Acute blood loss anemia in the setting of surgery: s/p 3 units PRBC so far. Hb stable Recent Labs    08/28/22 0340 08/29/22 0500 08/30/22 1133 08/31/22 0103 09/02/22 0709 09/03/22 0054 09/04/22 0315 09/05/22 0644 09/06/22 0617 09/08/22 0058  HGB 9.4* 8.0* 8.4* 8.1* 9.0* 8.9* 8.8* 11.9* 9.4* 9.8*  -Monitor intermittently.  Depression,bipolar,schizophrenia,insomnia, chronic back pain: mood has been stable. -Continue Lexapro, Remeron and trazodone -Pain medications as above.    Dysphagia:Likely related to [place]. Cortrack has been removed. She does not want to have NG tube or tube feeding. Family encouraged for oral nutrition.  Dysphagia seems to have resolved. -Upgraded to regular diet  Increased nutrient needs/decreased oral intake: Body mass index is 23.83 kg/m. Nutrition Problem: Increased nutrient needs Etiology: wound healing Signs/Symptoms: estimated needs Interventions: Refer to RD note for recommendations  Pressure skin injury: POA Pressure Injury 08/04/22 Sacrum Stage 2 -  Partial thickness loss of dermis presenting as a shallow open injury with a red, pink wound bed without  slough. (Active)  08/04/22 0100  Location: Sacrum  Location Orientation:   Staging: Stage 2 -  Partial thickness loss of dermis presenting as a shallow open injury with a red, pink wound bed without slough.  Wound Description (Comments):   Present on Admission: Yes  Dressing Type Foam - Lift dressing to assess site every shift 09/16/22 0811   DVT prophylaxis:  heparin injection 5,000 Units Start: 08/10/22 1600  Code Status: Full code Family Communication: None at bedside Level of care: Telemetry Medical Status is: Inpatient Remains inpatient appropriate because: Nonhealing bilateral groin wound requiring wound VAC and IV antibiotics,   Final disposition: SNF once cleared by consultants and approved by insurance Consultants:  Vascular surgery Infectious disease Palliative medicine  35 minutes with more than 50% spent in reviewing records, counseling patient/family and coordinating care.   Sch Meds:  Scheduled Meds:  (feeding supplement) PROSource Plus  30 mL Oral BID BM   arformoterol  15 mcg Nebulization BID   atorvastatin  80 mg Oral Daily   budesonide (PULMICORT) nebulizer solution  0.5 mg Nebulization BID   carvedilol  6.25 mg Oral BID WC   Chlorhexidine Gluconate Cloth  6 each Topical Q0600   clopidogrel  75 mg Oral Daily   dronabinol  2.5 mg Oral BID AC   escitalopram  10 mg Oral Daily   fentaNYL       fentaNYL       Gerhardt's butt cream   Topical BID   heparin injection (  subcutaneous)  5,000 Units Subcutaneous Q8H   insulin aspart  0-6 Units Subcutaneous TID WC   insulin aspart  2 Units Subcutaneous TID WC   insulin glargine-yfgn  5 Units Subcutaneous Daily   metroNIDAZOLE  500 mg Oral Q12H   mirtazapine  7.5 mg Oral QHS   multivitamin with minerals  1 tablet Oral Daily   mupirocin ointment       nicotine  14 mg Transdermal Daily   oxyCODONE  10 mg Oral Q12H   pantoprazole  40 mg Oral Daily   revefenacin  175 mcg Nebulization Daily   senna-docusate  1  tablet Oral QHS   sodium chloride flush  10-40 mL Intracatheter Q12H   sodium chloride flush  10-40 mL Intracatheter Q12H   sodium hypochlorite   Irrigation Daily   Continuous Infusions:  ceFEPime (MAXIPIME) IV     PRN Meds:.acetaminophen **OR** acetaminophen (TYLENOL) oral liquid 160 mg/5 mL **OR** acetaminophen, albuterol, fentaNYL, fentaNYL, hydrALAZINE, HYDROmorphone (DILAUDID) injection, methocarbamol, mupirocin ointment, ondansetron (ZOFRAN) IV **OR** ondansetron, oxyCODONE, polyethylene glycol, sodium chloride flush, traZODone  Antimicrobials: Anti-infectives (From admission, onward)    Start     Dose/Rate Route Frequency Ordered Stop   09/16/22 1600  ceFEPIme (MAXIPIME) 2 g in sodium chloride 0.9 % 100 mL IVPB        2 g 200 mL/hr over 30 Minutes Intravenous Every 8 hours 09/16/22 1232     09/09/22 1100  metroNIDAZOLE (FLAGYL) tablet 500 mg        500 mg Oral Every 12 hours 09/09/22 1002     08/20/22 2200  metroNIDAZOLE (FLAGYL) IVPB 500 mg  Status:  Discontinued        500 mg 100 mL/hr over 60 Minutes Intravenous Every 12 hours 08/20/22 1110 08/20/22 1111   08/20/22 2200  metroNIDAZOLE (FLAGYL) tablet 500 mg  Status:  Discontinued        500 mg Per Tube Every 12 hours 08/20/22 1111 09/09/22 1002   08/18/22 2300  ceFAZolin (ANCEF) IVPB 2g/100 mL premix  Status:  Discontinued        2 g 200 mL/hr over 30 Minutes Intravenous Every 8 hours 08/18/22 1455 09/16/22 1232   08/18/22 2300  metroNIDAZOLE (FLAGYL) IVPB 500 mg  Status:  Discontinued        500 mg 100 mL/hr over 60 Minutes Intravenous Every 6 hours 08/18/22 1455 08/20/22 1110   08/15/22 1100  vancomycin (VANCOCIN) IVPB 1000 mg/200 mL premix  Status:  Discontinued        1,000 mg 200 mL/hr over 60 Minutes Intravenous Every 24 hours 08/14/22 1006 08/15/22 1307   08/14/22 1400  piperacillin-tazobactam (ZOSYN) IVPB 3.375 g  Status:  Discontinued        3.375 g 12.5 mL/hr over 240 Minutes Intravenous Every 8 hours 08/14/22  1006 08/18/22 1455   08/14/22 1100  vancomycin (VANCOREADY) IVPB 1500 mg/300 mL        1,500 mg 150 mL/hr over 120 Minutes Intravenous  Once 08/14/22 1006 08/14/22 1502   08/04/22 1045  ceFAZolin (ANCEF) IVPB 2g/100 mL premix        2 g 200 mL/hr over 30 Minutes Intravenous On call to O.R. 08/04/22 0865 08/04/22 1100        I have personally reviewed the following labs and images: CBC: No results for input(s): "WBC", "NEUTROABS", "HGB", "HCT", "MCV", "PLT" in the last 168 hours.  BMP &GFR Recent Labs  Lab 09/15/22 0102  NA 134*  K 3.9  CL  103  CO2 27  GLUCOSE 160*  BUN 7*  CREATININE 0.67  CALCIUM 8.3*   Estimated Creatinine Clearance: 51.8 mL/min (by C-G formula based on SCr of 0.67 mg/dL). Liver & Pancreas: No results for input(s): "AST", "ALT", "ALKPHOS", "BILITOT", "PROT", "ALBUMIN" in the last 168 hours. No results for input(s): "LIPASE", "AMYLASE" in the last 168 hours. No results for input(s): "AMMONIA" in the last 168 hours. Diabetic: No results for input(s): "HGBA1C" in the last 72 hours. Recent Labs  Lab 09/16/22 0557 09/16/22 0835 09/16/22 0908 09/16/22 1148 09/16/22 1238  GLUCAP 176* 160* 137* 144* 213*   Cardiac Enzymes: No results for input(s): "CKTOTAL", "CKMB", "CKMBINDEX", "TROPONINI" in the last 168 hours. No results for input(s): "PROBNP" in the last 8760 hours. Coagulation Profile: No results for input(s): "INR", "PROTIME" in the last 168 hours. Thyroid Function Tests: No results for input(s): "TSH", "T4TOTAL", "FREET4", "T3FREE", "THYROIDAB" in the last 72 hours. Lipid Profile: No results for input(s): "CHOL", "HDL", "LDLCALC", "TRIG", "CHOLHDL", "LDLDIRECT" in the last 72 hours. Anemia Panel: No results for input(s): "VITAMINB12", "FOLATE", "FERRITIN", "TIBC", "IRON", "RETICCTPCT" in the last 72 hours. Urine analysis:    Component Value Date/Time   COLORURINE YELLOW (A) 11/09/2020 1652   APPEARANCEUR CLOUDY (A) 11/09/2020 1652    APPEARANCEUR Clear 06/20/2012 2127   LABSPEC 1.017 11/09/2020 1652   LABSPEC 1.022 06/20/2012 2127   PHURINE 5.0 11/09/2020 1652   GLUCOSEU NEGATIVE 11/09/2020 1652   GLUCOSEU >=500 06/20/2012 2127   HGBUR NEGATIVE 11/09/2020 [place] NEGATIVE 11/09/2020 1652   BILIRUBINUR Negative 06/20/2012 2127   KETONESUR NEGATIVE 11/09/2020 1652   PROTEINUR NEGATIVE 11/09/2020 1652   NITRITE NEGATIVE 11/09/2020 1652   LEUKOCYTESUR MODERATE (A) 11/09/2020 1652   LEUKOCYTESUR Negative 06/20/2012 2127   Sepsis Labs: Invalid input(s): "PROCALCITONIN", "LACTICIDVEN"  Microbiology: Recent Results (from the past 240 hour(s))  Surgical pcr screen     Status: None   Collection Time: 09/16/22  9:00 AM   Specimen: Nasal Mucosa; Nasal Swab  Result Value Ref Range Status   MRSA, PCR NEGATIVE NEGATIVE Final   Staphylococcus aureus NEGATIVE NEGATIVE Final    Comment: (NOTE) The Xpert SA Assay (FDA approved for NASAL specimens in patients 57 years of age and older), is one component of a comprehensive surveillance program. It is not intended to diagnose infection nor to guide or monitor treatment. Performed at [facility], [place] [address], [place], [place] [address]     Radiology Studies: No results found.    Sean Malinowski T. Waller  If 7PM-7AM, please contact night-coverage [email or web address] 09/16/2022, 4:08 PM

## 2022-09-16 NOTE — Plan of Care (Signed)
  Problem: Skin Integrity: Goal: Risk for impaired skin integrity will decrease Outcome: Progressing   Problem: Education: Goal: Knowledge of General Education information will improve Description: Including pain rating scale, medication(s)/side effects and non-pharmacologic comfort measures Outcome: Progressing   Problem: Clinical Measurements: Goal: Will remain free from infection Outcome: Progressing Goal: Diagnostic test results will improve Outcome: Progressing   Problem: Activity: Goal: Risk for activity intolerance will decrease Outcome: Progressing   Problem: Nutrition: Goal: Adequate nutrition will be maintained Outcome: Progressing

## 2022-09-17 ENCOUNTER — Encounter (HOSPITAL_COMMUNITY): Payer: Self-pay | Admitting: Vascular Surgery

## 2022-09-17 ENCOUNTER — Encounter: Payer: Self-pay | Admitting: *Deleted

## 2022-09-17 DIAGNOSIS — I739 Peripheral vascular disease, unspecified: Secondary | ICD-10-CM | POA: Diagnosis not present

## 2022-09-17 LAB — RENAL FUNCTION PANEL
Albumin: 2.1 g/dL — ABNORMAL LOW (ref 3.5–5.0)
Anion gap: 9 (ref 5–15)
BUN: 15 mg/dL (ref 8–23)
CO2: 21 mmol/L — ABNORMAL LOW (ref 22–32)
Calcium: 8.1 mg/dL — ABNORMAL LOW (ref 8.9–10.3)
Chloride: 104 mmol/L (ref 98–111)
Creatinine, Ser: 0.86 mg/dL (ref 0.44–1.00)
GFR, Estimated: >60 mL/min (ref >60)
Glucose, Bld: 333 mg/dL — ABNORMAL HIGH (ref 70–99)
Phosphorus: 4 mg/dL (ref 2.5–4.6)
Potassium: 4.9 mmol/L (ref 3.5–5.1)
Sodium: 134 mmol/L — ABNORMAL LOW (ref 135–145)

## 2022-09-17 LAB — GLUCOSE, CAPILLARY
Glucose-Capillary: 311 mg/dL — ABNORMAL HIGH (ref 70–99)
Glucose-Capillary: 310 mg/dL — ABNORMAL HIGH (ref 70–99)
Glucose-Capillary: 249 mg/dL — ABNORMAL HIGH (ref 70–99)
Glucose-Capillary: 265 mg/dL — ABNORMAL HIGH (ref 70–99)

## 2022-09-17 LAB — CBC
HCT: 30.8 % — ABNORMAL LOW (ref 36.0–46.0)
Hemoglobin: 9.4 g/dL — ABNORMAL LOW (ref 12.0–15.0)
MCH: 28.6 pg (ref 26.0–34.0)
MCHC: 30.5 g/dL (ref 30.0–36.0)
MCV: 93.6 fL (ref 80.0–100.0)
Platelets: 475 10*3/uL — ABNORMAL HIGH (ref 150–400)
RBC: 3.29 MIL/uL — ABNORMAL LOW (ref 3.87–5.11)
RDW: 15.4 % (ref 11.5–15.5)
WBC: 8.1 10*3/uL (ref 4.0–10.5)
nRBC: 0 % (ref 0.0–0.2)

## 2022-09-17 LAB — MAGNESIUM: Magnesium: 1.8 mg/dL (ref 1.7–2.4)

## 2022-09-17 NOTE — Progress Notes (Signed)
PROGRESS NOTE    Heather [name]  ZES:[id number] DOB: Jul 15, 1951 DOA: 08/04/2022 PCP: [name], FNP  No chief complaint on file.   Brief Narrative:  72 year old F with PMH of DM-2, diastolic CHF, COPD, HTN, HLD, GERD, PAD, AS s/p TAVR, tobacco use disorder, anxiety, depression and schizophrenia presented to ED on 12/11 with SBP in 200s, dizziness, headache and blurry vision, and admitted to ICU for hemorrhagic cerebral stroke and BLE ischemia.  She underwent BLE femoral endarterectomy and patch angioplasty with iliac stenting and 4 compartment fasciotomies.  Postop, she remained on vent and Cleviprex.  Eventually, she was extubated and transferred to hospitalist service on 12/17.  She also underwent left AKA and excisional debridement of bilateral groin wounds with VAC placement on 12/21.  Hospital course and then noteworthy for episode of hypoglycemia with insulin adjustments.  Initially required tube feed that was later discontinued.  Remains physically deconditioned.  Wound VAC discontinued on 1/18.  She is now WTD dressing with silver to bilateral groins. Concern about persistent or worsening greenish drainage with tunneling.  Underwent groin debridement and wound VAC placement on 1/23.  Antibiotics broadened to IV cefepime and Flagyl per ID and VVS.   Assessment & Plan:   Principal Problem:   PVD (peripheral vascular disease) ([facility]) Active Problems:   Hypertension, benign   S/P TAVR (transcatheter aortic valve replacement)   Tobacco use disorder   PAD (peripheral artery disease) ([facility])   ICH (intracerebral hemorrhage) ([facility])   Abscess of anal and rectal regions   Decubitus ulcer of sacral region, stage 2 ([place])   Pulmonary edema   Right groin wound   Wound of left groin   Decreased oral intake   Chronic diastolic CHF (congestive heart failure) ([place])   Dysphagia   Surgical site infection  Peripheral arterial disease/bilateral lower extremity ischemia -Bilateral CFA endarterectomy  with bovine patch angioplasty -Bilateral iliofemoral embolectomy, stenting on 12/11 -Bilateral CIA, stent Rt EIA and 4 compartment fasciotomies on 12/11 -Left AKA and excisional debridement of bilateral groin wounds with VAC placement 08/15/23 -1/21-concern about persistent or worsening greenish drainage with tunneling of left groin wound -1/23 s/p bilateral groin wound washout, soft tissue debridement, myriad morcell powder placement, vac dressing placement -1/23-antibiotics broadened to IV cefepime and Flagyl per recommendation by ID and VVS. -Continue Plavix and statin. -Patient has left arm midline in place.  -Tylenol, oxycodone, Robaxin and Dilaudid for pain control   Acute intracranial hemorrhage: MRI showed acute intraparenchymal hematoma in cerebellar vermis measuring 11 cc in volume and mild mass effect upon the fourth ventricle. Seen by neurology.  Neurologically stable   Acute respiratory failure with hypoxia COPD/tobacco use disorder: -Extubated 12/14> On room air currently. -Continue current Brovana Pulmicort,Yupleri and albuterol as needed.   Chronic diastolic CHF/HTN: TTE with LVEF of 60 to 65%, indeterminate DD.  Appears euvolemic on exam. -Continue Coreg.   Uncontrolled NIDDM-2 with hyperglycemia and hypoglycemia: A1c 12.9% on 12/12.  CBG within acceptable range. -Continue current insulin regimen -Continue statin   Essential hypertension: Mild hypotension earlier this morning. -Decreased Coreg to 6.25 mg twice daily    Hx of CAD/HLD: Stable. -Continue Coreg, Lipitor and Plavix   AS s/p TAVR: Noted on stable.   Acute blood loss anemia in the setting of surgery: s/p 3 units PRBC so far. Hb stable -Monitor intermittently.   Depression,bipolar,schizophrenia,insomnia, chronic back pain: mood has been stable. -Continue Lexapro, Remeron and trazodone -Pain medications as above.    Dysphagia:Likely related to [place]. Cortrack has been  removed. She does not want to have  NG tube or tube feeding. Family encouraged for oral nutrition.  Dysphagia seems to have resolved. -Upgraded to regular diet   Increased nutrient needs/decreased oral intake: Body mass index is 23.83 kg/m. Nutrition Problem: Increased nutrient needs Etiology: wound healing Signs/Symptoms: estimated needs Interventions: Refer to RD note for recommendations   Pressure skin injury: POA Pressure Injury 08/04/22 Sacrum Stage 2 -  Partial thickness loss of dermis presenting as Daeveon Zweber shallow open injury with Tommye Lehenbauer red, pink wound bed without slough. (Active)  08/04/22 0100  Location: Sacrum  Location Orientation:   Staging: Stage 2 -  Partial thickness loss of dermis presenting as Taliesin Hartlage shallow open injury with Jaece Ducharme red, pink wound bed without slough.  Wound Description (Comments):   Present on Admission: Yes      DVT prophylaxis: heparin Code Status: full Family Communication: none - called husband no answer Disposition:   Status is: Inpatient Remains inpatient appropriate because: continued inpatient management   Consultants:  ID vascular  Procedures:  1/23 Bilateral groin wound washout Soft tissue debridement Myriad Morcell powder placement 2500g VAC dressing placement  Antimicrobials:  Anti-infectives (From admission, onward)    Start     Dose/Rate Route Frequency Ordered Stop   09/16/22 1600  ceFEPIme (MAXIPIME) 2 g in sodium chloride 0.9 % 100 mL IVPB        2 g 200 mL/hr over 30 Minutes Intravenous Every 8 hours 09/16/22 1232     09/09/22 1100  metroNIDAZOLE (FLAGYL) tablet 500 mg        500 mg Oral Every 12 hours 09/09/22 1002     08/20/22 2200  metroNIDAZOLE (FLAGYL) IVPB 500 mg  Status:  Discontinued        500 mg 100 mL/hr over 60 Minutes Intravenous Every 12 hours 08/20/22 1110 08/20/22 1111   08/20/22 2200  metroNIDAZOLE (FLAGYL) tablet 500 mg  Status:  Discontinued        500 mg Per Tube Every 12 hours 08/20/22 1111 09/09/22 1002   08/18/22 2300  ceFAZolin (ANCEF) IVPB  2g/100 mL premix  Status:  Discontinued        2 g 200 mL/hr over 30 Minutes Intravenous Every 8 hours 08/18/22 1455 09/16/22 1232   08/18/22 2300  metroNIDAZOLE (FLAGYL) IVPB 500 mg  Status:  Discontinued        500 mg 100 mL/hr over 60 Minutes Intravenous Every 6 hours 08/18/22 1455 08/20/22 1110   08/15/22 1100  vancomycin (VANCOCIN) IVPB 1000 mg/200 mL premix  Status:  Discontinued        1,000 mg 200 mL/hr over 60 Minutes Intravenous Every 24 hours 08/14/22 1006 08/15/22 1307   08/14/22 1400  piperacillin-tazobactam (ZOSYN) IVPB 3.375 g  Status:  Discontinued        3.375 g 12.5 mL/hr over 240 Minutes Intravenous Every 8 hours 08/14/22 1006 08/18/22 1455   08/14/22 1100  vancomycin (VANCOREADY) IVPB 1500 mg/300 mL        1,500 mg 150 mL/hr over 120 Minutes Intravenous  Once 08/14/22 1006 08/14/22 1502   08/04/22 1045  ceFAZolin (ANCEF) IVPB 2g/100 mL premix        2 g 200 mL/hr over 30 Minutes Intravenous On call to O.R. 08/04/22 0958 08/04/22 1100       Subjective: Feels cold today, no complaints  Objective: Vitals:   09/17/22 0831 09/17/22 0900 09/17/22 1225 09/17/22 1602  BP:   (!) 114/47 (!) 121/30  Pulse: 65 67  73   Resp:  20    Temp:   98.3 F (36.8 C) 98.4 F (36.9 C)  TempSrc:   Oral Oral  SpO2:  98% 91%   Weight:      Height:        Intake/Output Summary (Last 24 hours) at 09/17/2022 1756 Last data filed at 09/17/2022 2595 Gross per 24 hour  Intake --  Output 775 ml  Net -775 ml   Filed Weights   09/10/22 0403 09/16/22 0005 09/16/22 0838  Weight: 62.6 kg 59.4 kg 55.3 kg    Examination:  General exam: Appears calm and comfortable  Respiratory system: unlabored Cardiovascular system: RRR Gastrointestinal system: Abdomen is nondistended, soft and nontender.  Central nervous system: Alert and oriented. No focal neurological deficits. Extremities: L AKA   Data Reviewed: I have personally reviewed following labs and imaging studies  CBC: Recent  Labs  Lab 09/17/22 0329  WBC 8.1  HGB 9.4*  HCT 30.8*  MCV 93.6  PLT 475*    Basic Metabolic Panel: Recent Labs  Lab 09/15/22 0102 09/17/22 0329  NA 134* 134*  K 3.9 4.9  CL 103 104  CO2 27 21*  GLUCOSE 160* 333*  BUN 7* 15  CREATININE 0.67 0.86  CALCIUM 8.3* 8.1*  MG  --  1.8  PHOS  --  4.0    GFR: Estimated Creatinine Clearance: 48.1 mL/min (by C-G formula based on SCr of 0.86 mg/dL).  Liver Function Tests: Recent Labs  Lab 09/17/22 0329  ALBUMIN 2.1*    CBG: Recent Labs  Lab 09/16/22 1633 09/16/22 2051 09/17/22 0521 09/17/22 1259 09/17/22 1653  GLUCAP 295* 310* 311* 310* 249*     Recent Results (from the past 240 hour(s))  Surgical pcr screen     Status: None   Collection Time: 09/16/22  9:00 AM   Specimen: Nasal Mucosa; Nasal Swab  Result Value Ref Range Status   MRSA, PCR NEGATIVE NEGATIVE Final   Staphylococcus aureus NEGATIVE NEGATIVE Final    Comment: (NOTE) The Xpert SA Assay (FDA approved for NASAL specimens in patients 18 years of age and older), is one component of [facility] comprehensive surveillance program. It is not intended to diagnose infection nor to guide or monitor treatment. Performed at [facility], [place] [address] [place], [place] [address]          Radiology Studies: No results found.      Scheduled Meds:  arformoterol  15 mcg Nebulization BID   atorvastatin  80 mg Oral Daily   budesonide (PULMICORT) nebulizer solution  0.5 mg Nebulization BID   carvedilol  6.25 mg Oral BID WC   Chlorhexidine Gluconate Cloth  6 each Topical Q0600   clopidogrel  75 mg Oral Daily   dronabinol  2.5 mg Oral BID AC   escitalopram  10 mg Oral Daily   Gerhardt's butt cream   Topical BID   heparin injection (subcutaneous)  5,000 Units Subcutaneous Q8H   insulin aspart  0-6 Units Subcutaneous TID WC   insulin aspart  2 Units Subcutaneous TID WC   insulin glargine-yfgn  5 Units Subcutaneous Daily   metroNIDAZOLE  500 mg Oral Q12H    mirtazapine  7.5 mg Oral QHS   multivitamin with minerals  1 tablet Oral Daily   nicotine  14 mg Transdermal Daily   oxyCODONE  10 mg Oral Q12H   pantoprazole  40 mg Oral Daily   revefenacin  175 mcg Nebulization Daily   senna-docusate  1 tablet Oral QHS   sodium chloride flush  10-40 mL Intracatheter Q12H   sodium chloride flush  10-40 mL Intracatheter Q12H   sodium hypochlorite   Irrigation Daily   Continuous Infusions:  ceFEPime (MAXIPIME) IV 2 g (09/17/22 1720)     LOS: 44 days    Time spent: over 30 min    Fayrene Helper, MD [facility]   To contact the attending provider between 7A-7P or the covering provider during after hours 7P-7A, please log into the web site [email or web address] and access using universal [place] password for that web site. If you do not have the password, please call the hospital operator.  09/17/2022, 5:56 PM

## 2022-09-17 NOTE — Consult Note (Signed)
WOC re-consulted to begin NPWT dressing changes again to the bilateral groin wounds. Debrided per vascular 1 23/24  Will begin changing NPWT dressings Friday 09/19/22   Thanks  Lily [place] MSN, RN,CWOCN, CNS, CWON-AP [phone])

## 2022-09-17 NOTE — Progress Notes (Signed)
Mobility Specialist Progress Note:   09/17/22 1535  Mobility  Activity Dangled on edge of bed  Level of Assistance Moderate assist, patient does 50-74%  Assistive Device None  Activity Response Tolerated well  $Mobility charge 1 Mobility   Pt received in bed willing to participate in mobility. Complaints of back pain when sitting EOB. ModA to get to EOB then pt able to remain sitting for 20 minutes. Pt needed occasional cues to hold self up. MinA to lay back in bed. Left in bed with call bell in reach and all needs met.   Gareth [facility] Mobility Specialist Please contact via [employer] or  Rehab Office at [phone]

## 2022-09-17 NOTE — Progress Notes (Signed)
Nutrition Follow-up  DOCUMENTATION CODES:   Not applicable  INTERVENTION:   Encourage good PO intake  Assist in meal set-up at every meal Continue Multivitamin w/ minerals daily Discontinue ProSource Plus   NUTRITION DIAGNOSIS:   Increased nutrient needs related to wound healing as evidenced by estimated needs. - Ongoing   GOAL:   Patient will meet greater than or equal to 90% of their needs - Ongoing  MONITOR:   PO intake, Supplement acceptance, Diet advancement, Labs, Weight trends, TF tolerance, Skin, I & O's  REASON FOR ASSESSMENT:   Consult Assessment of nutrition requirement/status, Calorie Count  ASSESSMENT:   Pt with PMH of CHF, anxiety, PAD, bipolar, COPD, CAD, depression, DM with neuropathy, GERD, HTN, HLD, schizophrenia admitted with acute ICH and critical limb ischemia.  12/11: s/p b/l common femoral endarterectomy, iliofemoral endarterectomy, lower extremity embolectomy and stent of b/l common iliac arteries and Rt external iliac artery, and b/l 4 compartment fasciotomies  S/p cortrak placement; per xray tip in distal stomach or proximal duodenum  12/14: extubated 12/16: transferred from 4N to 4E 12/18: underwent FEES; advanced to dysphagia 2 diet with thin liquids per SLP, however there is a comment on the diet order for milk precautions: no milk to drink, no ice cream, milkshakes, milk-like supplements as pt had penetration with thin milk on study completed 12/19: plan per team was to discontinue tube feeds, but after discussing with MD regarding poor PO intake, changed to nocturnal tube feeds instead 12/21: s/p left AKA and shart excisional debridement of bilateral surgical groin wounds and wound VAC placement 12/22: seen by SLP and diet advanced to dysphagia 3 with thin liquids; per comment section of this diet order no milk on tray but other dairy okay 12/27: diet downgraded to dysphagia 1 with thin liquids by MD per request of patient's family 12/29: SLP  removed milk restrictions on tray, so now able to order Ensure supplements 12/31: diet upgraded from dysphagia 1 to dysphagia 3 1/4: overnight pt had rapid response for SOB and hypoxia and required BiPAP so tube feeds were held 1/5: overnight pt requested tube feeds be held due to feeling full 1/10: Cortrak removed  1/23: diet advanced to regular; OR, bilateral groin washout + wound VAC placement   Pt laying in bed. Reports that she has been eating much better since diet has been advanced to regular texture. Provided RD with lunch order, RD placed. Recommend that pt family bringing in food as pt desires.   Meal Intake  01/08-01/12: 0-90% x 8 meals (average 41%) 01/18-01/21: 0-75% x 8 meals (average 22%)  Discussed with RN, reports that pt ate ~60% of her meal. Ate about half of her pancakes and most of everything else.  Per EMR, pt is not taking the ProSource.   Medications reviewed and include: Marinol, NovoLog SSI + 2 units, Semglee, Remeron, MVI, Protonix, Senokot-S, IV antibiotics  Labs reviewed: Sodium 134, Potassium 4.9, Phosphorus 4.0, Magnesium 1.8, 24 hr CBGs 137-311  Diet Order:   Diet Order             Diet regular Room service appropriate? Yes with Assist; Fluid consistency: Thin  Diet effective now                   EDUCATION NEEDS:   Education needs have been addressed (Encouraged adequate intake of calories and protein and discussed importance of adequate intake.)  Skin:  Skin Assessment: Skin Integrity Issues: Skin Integrity Issues:: Stage II, Incisions, Wound VAC,  Other (Comment) Stage II: sacrum Wound Vac: bilateral groin surgical wounds Incisions: closed incision to leg Other: surgical wounds bilateral groin with wound VAC  Last BM:  1/23  Height:  Ht Readings from Last 1 Encounters:  09/16/22 5' (1.524 m)   Weight:  Wt Readings from Last 1 Encounters:  09/16/22 55.3 kg   BMI:  Body mass index is 23.83 kg/m.  Estimated Nutritional Needs:   Kcal:  1700-1900 Protein:  100-125 grams Fluid:  >1.7 L/day   Hermina Barters RD, LDN Clinical Dietitian See [facility] for contact information.

## 2022-09-17 NOTE — Progress Notes (Signed)
Occupational Therapy Treatment Patient Details Name: Heather [name] MRN: [id number] DOB: 30-Mar-1951 Today's Date: 09/17/2022   History of present illness Pt is a 72 y.o. female admitted 08/04/22 with headache, dizziness. 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  Found to have mottled LE's after given Narcdipine due to HTN; s/p emergent common femoral and iliofemoral endarterectomy and bilateral 4 compartment fasciotomies on 12/11. 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 S/p groin washout/wound vac placement 1/23. PMH includes smoker(2ppd), severe AI/AS s/p TAVR, CAD s/p PCI and on DAPT, COPD, PAD, HTN.   OT comments  Patient progressing towards OT goals.  Alert and eager to participate in OT today, excited as family coming to visit.  Patient completing bed mobility with mod assist, sitting EOB with mod assist fading to min guard engaging in ADLs with 0-1 hand support.  Limited by back pain but demonstrating improved tolerance today. Will follow.    Recommendations for follow up therapy are one component of a multi-disciplinary discharge planning process, led by the attending physician.  Recommendations may be updated based on patient status, additional functional criteria and insurance authorization.    Follow Up Recommendations  Skilled nursing-short term rehab (<3 hours/day)     Assistance Recommended at Discharge Frequent or constant Supervision/Assistance  Patient can return home with the following  Two people to help with walking and/or transfers;A lot of help with bathing/dressing/bathroom;Assistance with cooking/housework;Assistance with feeding;Direct supervision/assist for medications management;Direct supervision/assist for financial management;Assist for transportation;Help with stairs or ramp for entrance   Equipment Recommendations  Other (comment) (defer)    Recommendations for Other Services      Precautions / Restrictions  Precautions Precautions: Fall;Other (comment) Precaution Comments: multiple buttocks wounds, B groin wound vac Restrictions Weight Bearing Restrictions: Yes LLE Weight Bearing: Non weight bearing       Mobility Bed Mobility Overal bed mobility: Needs Assistance Bed Mobility: Rolling, Sidelying to Sit, Sit to Sidelying Rolling: Min assist Sidelying to sit: Mod assist     Sit to sidelying: Mod assist General bed mobility comments: rolling towards L side, mod assist to elevate trunk    Transfers                         Balance Overall balance assessment: Needs assistance Sitting-balance support: No upper extremity supported, Single extremity supported, Bilateral upper extremity supported Sitting balance-Leahy Scale: Fair Sitting balance - Comments: sat EOB x 20 minutes, mod assist with posterior lean initally fading to min guard with 0-2 hand support. Postural control: Posterior lean                                 ADL either performed or assessed with clinical judgement   ADL Overall ADL's : Needs assistance/impaired     Grooming: Min guard;Sitting;Wash/dry hands Grooming Details (indicate cue type and reason): EOB Upper Body Bathing: Min guard;Sitting Upper Body Bathing Details (indicate cue type and reason): simulated by applying lotion at EOB         Lower Body Dressing: Total assistance;Sitting/lateral leans;Bed level                      Extremity/Trunk Assessment              Vision       Perception     Praxis  Cognition Arousal/Alertness: Awake/alert Behavior During Therapy: Flat affect Overall Cognitive Status: Impaired/Different from baseline Area of Impairment: Following commands, Safety/judgement, Attention, Memory, Awareness, Problem solving                   Current Attention Level: Selective Memory: Decreased short-term memory Following Commands: Follows one step commands consistently,  Follows one step commands with increased time Safety/Judgement: Decreased awareness of safety, Decreased awareness of deficits Awareness: Emergent Problem Solving: Slow processing, Decreased initiation, Requires verbal cues, Difficulty sequencing General Comments: pt alert and in good spirits today.  Increased time to follow simple commands.        Exercises      Shoulder Instructions       General Comments VSS    Pertinent Vitals/ Pain       Pain Assessment Pain Assessment: Faces Faces Pain Scale: Hurts little more Pain Location: back Pain Descriptors / Indicators: Grimacing, Guarding, Discomfort Pain Intervention(s): Limited activity within patient's tolerance, Monitored during session, Repositioned, Patient requesting pain meds-RN notified  Home Living                                          Prior Functioning/Environment              Frequency  Min 2X/week        Progress Toward Goals  OT Goals(current goals can now be found in the care plan section)  Progress towards OT goals: Progressing toward goals  Acute Rehab OT Goals Patient Stated Goal: less pain OT Goal Formulation: With patient Time For Goal Achievement: 09/24/22 Potential to Achieve Goals: Good  Plan Discharge plan remains appropriate;Frequency remains appropriate    Co-evaluation                 AM-PAC OT "6 Clicks" Daily Activity     Outcome Measure   Help from another person eating meals?: A Little Help from another person taking care of personal grooming?: A Little Help from another person toileting, which includes using toliet, bedpan, or urinal?: Total Help from another person bathing (including washing, rinsing, drying)?: A Lot Help from another person to put on and taking off regular upper body clothing?: A Little Help from another person to put on and taking off regular lower body clothing?: Total 6 Click Score: 13    End of Session Equipment Utilized  During Treatment: Oxygen  OT Visit Diagnosis: Unsteadiness on feet (R26.81);Other abnormalities of gait and mobility (R26.89);Muscle weakness (generalized) (M62.81);Pain Pain - part of body:  (back)   Activity Tolerance Patient tolerated treatment well   Patient Left in bed;with call bell/phone within reach;with bed alarm set;with family/visitor present   Nurse Communication Mobility status        Time: 2130-8657 OT Time Calculation (min): 35 min  Charges: OT General Charges $OT Visit: 1 Visit OT Treatments $Self Care/Home Management : 23-37 mins  [place] [phone]   Delight Stare 09/17/2022, 1:47 PM

## 2022-09-17 NOTE — Progress Notes (Addendum)
  Progress Note    09/17/2022 7:47 AM 1 Day Post-Op  Subjective:  resting comfortably    Vitals:   09/17/22 0415 09/17/22 0727  BP: (!) 108/48 (!) 118/51  Pulse: (!) 57 (!) 56  Resp: 16   Temp: 98.6 F (37 C) 97.9 F (36.6 C)  SpO2: 98% 98%    Physical Exam: Lungs:  nonlabored Incisions:  bilateral groins with wound vacs with good seal Extremities:  L AKA healing. RLE with PT/DP doppler signals   CBC    Component Value Date/Time   WBC 8.1 09/17/2022 0329   RBC 3.29 (L) 09/17/2022 0329   HGB 9.4 (L) 09/17/2022 0329   HGB 14.3 06/20/2012 2127   HCT 30.8 (L) 09/17/2022 0329   HCT 42.9 06/20/2012 2127   PLT 475 (H) 09/17/2022 0329   PLT 357 06/20/2012 2127   MCV 93.6 09/17/2022 0329   MCV 91 06/20/2012 2127   MCH 28.6 09/17/2022 0329   MCHC 30.5 09/17/2022 0329   RDW 15.4 09/17/2022 0329   RDW 13.6 06/20/2012 2127   LYMPHSABS 1.3 08/31/2022 0103   MONOABS 0.9 08/31/2022 0103   EOSABS 0.3 08/31/2022 0103   BASOSABS 0.1 08/31/2022 0103    BMET    Component Value Date/Time   NA 134 (L) 09/17/2022 0329   NA 135 (L) 06/20/2012 2127   K 4.9 09/17/2022 0329   K 4.0 06/20/2012 2127   CL 104 09/17/2022 0329   CL 100 06/20/2012 2127   CO2 21 (L) 09/17/2022 0329   CO2 27 06/20/2012 2127   GLUCOSE 333 (H) 09/17/2022 0329   GLUCOSE 250 (H) 06/20/2012 2127   BUN 15 09/17/2022 0329   BUN 20 (H) 06/20/2012 2127   CREATININE 0.86 09/17/2022 0329   CREATININE 0.60 06/20/2012 2127   CALCIUM 8.1 (L) 09/17/2022 0329   CALCIUM 8.8 06/20/2012 2127   GFRNONAA >60 09/17/2022 0329   GFRNONAA >60 06/20/2012 2127   GFRAA >60 06/20/2012 2127    INR    Component Value Date/Time   INR 1.0 08/03/2022 2156     Intake/Output Summary (Last 24 hours) at 09/17/2022 0747 Last data filed at 09/17/2022 8841 Gross per 24 hour  Intake 517.43 ml  Output 825 ml  Net -307.57 ml      Assessment/Plan:  72 y.o. female is s/p: BLE revascularization, L AKA, and bilateral groin  washout x2 with wound vac placement   -bilateral groins with wound vac dressings with good seal -L AKA healing appropriately -RLE with DP/PT doppler signals -Will re-consult WOC for MWF bilateral groin wound vac changes, first change on 1/26   Vicente Serene, PA-C [facility] [phone] 09/17/2022 7:47 AM  I have independently interviewed and examined patient and agree with PA assessment and plan above.   Bristyl Mclees C. Donzetta Matters, MD [facility] [place] Office: [phone] Pager: [phone]

## 2022-09-17 NOTE — Plan of Care (Signed)
  Problem: Education: Goal: Knowledge of disease or condition will improve Outcome: Progressing   Problem: Self-Care: Goal: Ability to communicate needs accurately will improve Outcome: Progressing   Problem: Fluid Volume: Goal: Ability to maintain a balanced intake and output will improve Outcome: Progressing   Problem: Health Behavior/Discharge Planning: Goal: Ability to manage health-related needs will improve Outcome: Progressing   Problem: Nutritional: Goal: Maintenance of adequate nutrition will improve Outcome: Progressing

## 2022-09-18 ENCOUNTER — Inpatient Hospital Stay (HOSPITAL_COMMUNITY): Payer: Medicare HMO

## 2022-09-18 DIAGNOSIS — I739 Peripheral vascular disease, unspecified: Secondary | ICD-10-CM | POA: Diagnosis not present

## 2022-09-18 LAB — CBC
HCT: 29.8 % — ABNORMAL LOW (ref 36.0–46.0)
Hemoglobin: 9.1 g/dL — ABNORMAL LOW (ref 12.0–15.0)
MCH: 28.4 pg (ref 26.0–34.0)
MCHC: 30.5 g/dL (ref 30.0–36.0)
MCV: 93.1 fL (ref 80.0–100.0)
Platelets: 525 10*3/uL — ABNORMAL HIGH (ref 150–400)
RBC: 3.2 MIL/uL — ABNORMAL LOW (ref 3.87–5.11)
RDW: 15.4 % (ref 11.5–15.5)
WBC: 6.6 10*3/uL (ref 4.0–10.5)
nRBC: 0 % (ref 0.0–0.2)

## 2022-09-18 LAB — RESP PANEL BY RT-PCR (RSV, FLU A&B, COVID)  RVPGX2
Influenza A by PCR: NEGATIVE
Influenza B by PCR: NEGATIVE
Resp Syncytial Virus by PCR: NEGATIVE
SARS Coronavirus 2 by RT PCR: POSITIVE — AB

## 2022-09-18 LAB — GLUCOSE, CAPILLARY
Glucose-Capillary: 220 mg/dL — ABNORMAL HIGH (ref 70–99)
Glucose-Capillary: 220 mg/dL — ABNORMAL HIGH (ref 70–99)
Glucose-Capillary: 118 mg/dL — ABNORMAL HIGH (ref 70–99)

## 2022-09-18 LAB — BASIC METABOLIC PANEL
Anion gap: 9 (ref 5–15)
BUN: 13 mg/dL (ref 8–23)
CO2: 22 mmol/L (ref 22–32)
Calcium: 8.5 mg/dL — ABNORMAL LOW (ref 8.9–10.3)
Chloride: 105 mmol/L (ref 98–111)
Creatinine, Ser: 0.74 mg/dL (ref 0.44–1.00)
GFR, Estimated: >60 mL/min (ref >60)
Glucose, Bld: 172 mg/dL — ABNORMAL HIGH (ref 70–99)
Potassium: 4.4 mmol/L (ref 3.5–5.1)
Sodium: 136 mmol/L (ref 135–145)

## 2022-09-18 MED ORDER — INSULIN GLARGINE-YFGN 100 UNIT/ML ~~LOC~~ SOLN
5.0000 [IU] | Freq: Two times a day (BID) | SUBCUTANEOUS | Status: DC
  Administered 2022-09-18 – 2022-09-22 (×8): 5 [IU] via SUBCUTANEOUS
  Filled 2022-09-18 (×9): qty 0.05

## 2022-09-18 NOTE — Progress Notes (Signed)
PT Cancellation Note  Patient Details Name: Heather [name] MRN: [id number] DOB: [birth year]   Cancelled Treatment:    Reason Eval/Treat Not Completed: Other (comment). Pt feeling fatigued and generally bad. Pt with new Covid + test.    Shary Decamp [facility] 09/18/2022, 3:00 PM [place] Office [phone]

## 2022-09-18 NOTE — Inpatient Diabetes Management (Signed)
Inpatient Diabetes Program Recommendations  AACE/ADA: New Consensus Statement on Inpatient Glycemic Control (2015)  Target Ranges:  Prepandial:   less than 140 mg/dL      Peak postprandial:   less than 180 mg/dL (1-2 hours)      Critically ill patients:  140 - 180 mg/dL   Lab Results  Component Value Date   GLUCAP 220 (H) 09/18/2022   HGBA1C 12.9 (H) 08/05/2022    Latest Reference Range & Units 09/17/22 05:21 09/17/22 12:59 09/17/22 16:53 09/17/22 21:21 09/18/22 06:05  Glucose-Capillary 70 - 99 mg/dL 311 (H) 310 (H) 249 (H) 265 (H) 220 (H)  (H): Data is abnormally high Review of Glycemic Control  Diabetes history: type 2 Outpatient Diabetes medications: Levemir 18 units at HS, Novolog 5 units TID, Metformin 750 mg daily Current orders for Inpatient glycemic control: Semglee 5 units at HS, Novolog 0-6 units correction scale TID, Novolog 2 units TID with meals  Inpatient Diabetes Program Recommendations:   Noted that patient's CBGs have been greater than 180 mg/dl. Recommend increasing Semglee to 5 units BID if blood sugars continue to be elevated.   Harvel Ricks RN BSN CDE Diabetes Coordinator Pager: [phone]  8am-5pm

## 2022-09-18 NOTE — Progress Notes (Addendum)
  Progress Note    09/18/2022 7:41 AM 2 Days Post-Op  Subjective:  resting comfortably    Vitals:   09/18/22 0017 09/18/22 0515  BP: (!) 131/56 135/68  Pulse: 69 71  Resp: 18 18  Temp: 98.7 F (37.1 C) 98.1 F (36.7 C)  SpO2: 99% 90%    Physical Exam: Lungs:  nonlabored Incisions:  bilateral groin wound vacs intact with good seal Extremities:  healing L AKA. R DP/PT doppler signals   CBC    Component Value Date/Time   WBC 8.1 09/17/2022 0329   RBC 3.29 (L) 09/17/2022 0329   HGB 9.4 (L) 09/17/2022 0329   HGB 14.3 06/20/2012 2127   HCT 30.8 (L) 09/17/2022 0329   HCT 42.9 06/20/2012 2127   PLT 475 (H) 09/17/2022 0329   PLT 357 06/20/2012 2127   MCV 93.6 09/17/2022 0329   MCV 91 06/20/2012 2127   MCH 28.6 09/17/2022 0329   MCHC 30.5 09/17/2022 0329   RDW 15.4 09/17/2022 0329   RDW 13.6 06/20/2012 2127   LYMPHSABS 1.3 08/31/2022 0103   MONOABS 0.9 08/31/2022 0103   EOSABS 0.3 08/31/2022 0103   BASOSABS 0.1 08/31/2022 0103    BMET    Component Value Date/Time   NA 134 (L) 09/17/2022 0329   NA 135 (L) 06/20/2012 2127   K 4.9 09/17/2022 0329   K 4.0 06/20/2012 2127   CL 104 09/17/2022 0329   CL 100 06/20/2012 2127   CO2 21 (L) 09/17/2022 0329   CO2 27 06/20/2012 2127   GLUCOSE 333 (H) 09/17/2022 0329   GLUCOSE 250 (H) 06/20/2012 2127   BUN 15 09/17/2022 0329   BUN 20 (H) 06/20/2012 2127   CREATININE 0.86 09/17/2022 0329   CREATININE 0.60 06/20/2012 2127   CALCIUM 8.1 (L) 09/17/2022 0329   CALCIUM 8.8 06/20/2012 2127   GFRNONAA >60 09/17/2022 0329   GFRNONAA >60 06/20/2012 2127   GFRAA >60 06/20/2012 2127    INR    Component Value Date/Time   INR 1.0 08/03/2022 2156     Intake/Output Summary (Last 24 hours) at 09/18/2022 0741 Last data filed at 09/18/2022 0019 Gross per 24 hour  Intake --  Output 300 ml  Net -300 ml      Assessment/Plan:  72 y.o. female is s/p: BLE revascularization, L AKA, and bilateral groin washout x2 with wound vac  placement    -L AKA healing appropriately -Bilateral groins with wound vac with good seal. First dressing change with WOC tomorrow. VVS will assess groins in the morning -On cefepime and flagyl   Vicente Serene, PA-C [facility] [phone] 09/18/2022 7:41 AM  I have independently interviewed and examined patient and agree with PA assessment and plan above. Plan wound vacs off in a.m..  Laquincy Eastridge C. Donzetta Matters, MD [facility] [place] Office: [phone] Pager: [phone]

## 2022-09-18 NOTE — Progress Notes (Signed)
PROGRESS NOTE    Heather [name]  ZOX:[id number] DOB: October 25, 1950 DOA: 08/04/2022 PCP: Heather Pac, FNP  No chief complaint on file.   Brief Narrative:  72 year old F with PMH of DM-2, diastolic CHF, COPD, HTN, HLD, GERD, PAD, AS s/p TAVR, tobacco use disorder, anxiety, depression and schizophrenia presented to ED on 12/11 with SBP in 200s, dizziness, headache and blurry vision, and admitted to ICU for hemorrhagic cerebral stroke and BLE ischemia.  She underwent BLE femoral endarterectomy and patch angioplasty with iliac stenting and 4 compartment fasciotomies.  Postop, she remained on vent and Cleviprex.  Eventually, she was extubated and transferred to hospitalist service on 12/17.  She also underwent left AKA and excisional debridement of bilateral groin wounds with VAC placement on 12/21.  Hospital course and then noteworthy for episode of hypoglycemia with insulin adjustments.  Initially required tube feed that was later discontinued.  Remains physically deconditioned.  Wound VAC discontinued on 1/18.  She is now WTD dressing with silver to bilateral groins. Concern about persistent or worsening greenish drainage with tunneling.  Underwent groin debridement and wound VAC placement on 1/23.  Antibiotics broadened to IV cefepime and Flagyl per ID and VVS.   Assessment & Plan:   Principal Problem:   PVD (peripheral vascular disease) (HCC) Active Problems:   Hypertension, benign   S/P TAVR (transcatheter aortic valve replacement)   Tobacco use disorder   PAD (peripheral artery disease) (HCC)   ICH (intracerebral hemorrhage) (HCC)   Abscess of anal and rectal regions   Decubitus ulcer of sacral region, stage 2 ([place])   Pulmonary edema   Right groin wound   Wound of left groin   Decreased oral intake   Chronic diastolic CHF (congestive heart failure) ([place])   Dysphagia   Surgical site infection  Malaise  Myalgias  Concern for Viral Illness Resp panel pending Afebrile, normal WBC  count  Peripheral arterial disease/bilateral lower extremity ischemia -Bilateral CFA endarterectomy with bovine patch angioplasty -Bilateral iliofemoral embolectomy, stenting on 12/11 -Bilateral CIA, stent Rt EIA and 4 compartment fasciotomies on 12/11 -Left AKA and excisional debridement of bilateral groin wounds with VAC placement 08/15/23 -1/21-concern about persistent or worsening greenish drainage with tunneling of left groin wound -1/23 s/p bilateral groin wound washout, soft tissue debridement, myriad morcell powder placement, vac dressing placement -cefepime x 6 weeks per ID, appreciate recs -Continue Plavix and statin. -Patient has left arm midline in place.  -Tylenol, oxycodone, Robaxin and Dilaudid for pain control   Acute intracranial hemorrhage: MRI showed acute intraparenchymal hematoma in cerebellar vermis measuring 11 cc in volume and mild mass effect upon the fourth ventricle. Seen by neurology.  Neurologically stable   Acute respiratory failure with hypoxia COPD/tobacco use disorder: -Extubated 12/14> On room air currently. -Continue current Brovana Pulmicort,Yupleri and albuterol as needed.   Chronic diastolic CHF/HTN: TTE with LVEF of 60 to 65%, indeterminate DD.  Appears euvolemic on exam. -Continue Coreg.   Uncontrolled NIDDM-2 with hyperglycemia and hypoglycemia: A1c 12.9% on 12/12.  CBG within acceptable range. -Continue current insulin regimen -Continue statin   Essential hypertension: Mild hypotension earlier this morning. -Decreased Coreg to 6.25 mg twice daily    Hx of CAD/HLD: Stable. -Continue Coreg, Lipitor and Plavix   AS s/p TAVR: Noted on stable.   Acute blood loss anemia in the setting of surgery: s/p 3 units PRBC so far. Hb stable -Monitor intermittently.   Depression,bipolar,schizophrenia,insomnia, chronic back pain: mood has been stable. -Continue Lexapro, Remeron and trazodone -Pain medications as  above.    Dysphagia:Likely related to  [place]. Cortrack has been removed. She does not want to have NG tube or tube feeding. Family encouraged for oral nutrition.  Dysphagia seems to have resolved. -Upgraded to regular diet   Increased nutrient needs/decreased oral intake: Body mass index is 23.83 kg/m. Nutrition Problem: Increased nutrient needs Etiology: wound healing Signs/Symptoms: estimated needs Interventions: Refer to RD note for recommendations   Pressure skin injury: POA Pressure Injury 08/04/22 Sacrum Stage 2 -  Partial thickness loss of dermis presenting as [name] shallow open injury with [name] red, pink wound bed without slough. (Active)  08/04/22 0100  Location: Sacrum  Location Orientation:   Staging: Stage 2 -  Partial thickness loss of dermis presenting as [name] shallow open injury with [name] red, pink wound bed without slough.  Wound Description (Comments):   Present on Admission: Yes      DVT prophylaxis: heparin Code Status: full Family Communication: none - called husband no answer 1/24 Disposition:   Status is: Inpatient Remains inpatient appropriate because: continued inpatient management   Consultants:  ID vascular  Procedures:  1/23 Bilateral groin wound washout Soft tissue debridement Myriad Morcell powder placement 2500g VAC dressing placement  Antimicrobials:  Anti-infectives (From admission, onward)    Start     Dose/Rate Route Frequency Ordered Stop   09/16/22 1600  ceFEPIme (MAXIPIME) 2 g in sodium chloride 0.9 % 100 mL IVPB        2 g 200 mL/hr over 30 Minutes Intravenous Every 8 hours 09/16/22 1232     09/09/22 1100  metroNIDAZOLE (FLAGYL) tablet 500 mg        500 mg Oral Every 12 hours 09/09/22 1002     08/20/22 2200  metroNIDAZOLE (FLAGYL) IVPB 500 mg  Status:  Discontinued        500 mg 100 mL/hr over 60 Minutes Intravenous Every 12 hours 08/20/22 1110 08/20/22 1111   08/20/22 2200  metroNIDAZOLE (FLAGYL) tablet 500 mg  Status:  Discontinued        500 mg Per Tube Every 12 hours  08/20/22 1111 09/09/22 1002   08/18/22 2300  ceFAZolin (ANCEF) IVPB 2g/100 mL premix  Status:  Discontinued        2 g 200 mL/hr over 30 Minutes Intravenous Every 8 hours 08/18/22 1455 09/16/22 1232   08/18/22 2300  metroNIDAZOLE (FLAGYL) IVPB 500 mg  Status:  Discontinued        500 mg 100 mL/hr over 60 Minutes Intravenous Every 6 hours 08/18/22 1455 08/20/22 1110   08/15/22 1100  vancomycin (VANCOCIN) IVPB 1000 mg/200 mL premix  Status:  Discontinued        1,000 mg 200 mL/hr over 60 Minutes Intravenous Every 24 hours 08/14/22 1006 08/15/22 1307   08/14/22 1400  piperacillin-tazobactam (ZOSYN) IVPB 3.375 g  Status:  Discontinued        3.375 g 12.5 mL/hr over 240 Minutes Intravenous Every 8 hours 08/14/22 1006 08/18/22 1455   08/14/22 1100  vancomycin (VANCOREADY) IVPB 1500 mg/300 mL        1,500 mg 150 mL/hr over 120 Minutes Intravenous  Once 08/14/22 1006 08/14/22 1502   08/04/22 1045  ceFAZolin (ANCEF) IVPB 2g/100 mL premix        2 g 200 mL/hr over 30 Minutes Intravenous On call to O.R. 08/04/22 0958 08/04/22 1100       Subjective: Feels like she has the flu Achy   Objective: Vitals:   09/18/22 0515 09/18/22 0814 09/18/22  0858 09/18/22 1118  BP: 135/68 (!) 158/75  (!) 180/64  Pulse: 71 82  82  Resp: 18     Temp: 98.1 F (36.7 C) 98.7 F (37.1 C)  98.5 F (36.9 C)  TempSrc: Oral Oral  Oral  SpO2: 90% 93% 98% 90%  Weight:      Height:        Intake/Output Summary (Last 24 hours) at 09/18/2022 1140 Last data filed at 09/18/2022 0019 Gross per 24 hour  Intake --  Output 300 ml  Net -300 ml   Filed Weights   09/10/22 0403 09/16/22 0005 09/16/22 0838  Weight: 62.6 kg 59.4 kg 55.3 kg    Examination:  General: No acute distress. Cardiovascular: RRR Lungs: unlabored Abdomen: mild TTP to RLQ Neurological: Alert and oriented 3. Moves all extremities 4 with equal strength. Cranial nerves II through XII grossly intact. Skin: wound vacs to bilateral  groins Extremities: L AKA   Data Reviewed: I have personally reviewed following labs and imaging studies  CBC: Recent Labs  Lab 09/17/22 0329 09/18/22 0748  WBC 8.1 6.6  HGB 9.4* 9.1*  HCT 30.8* 29.8*  MCV 93.6 93.1  PLT 475* 525*    Basic Metabolic Panel: Recent Labs  Lab 09/15/22 0102 09/17/22 0329 09/18/22 0748  NA 134* 134* 136  K 3.9 4.9 4.4  CL 103 104 105  CO2 27 21* 22  GLUCOSE 160* 333* 172*  BUN 7* 15 13  CREATININE 0.67 0.86 0.74  CALCIUM 8.3* 8.1* 8.5*  MG  --  1.8  --   PHOS  --  4.0  --     GFR: Estimated Creatinine Clearance: 51.8 mL/min (by C-G formula based on SCr of 0.74 mg/dL).  Liver Function Tests: Recent Labs  Lab 09/17/22 0329  ALBUMIN 2.1*    CBG: Recent Labs  Lab 09/17/22 1259 09/17/22 1653 09/17/22 2121 09/18/22 0605 09/18/22 1117  GLUCAP 310* 249* 265* 220* 220*     Recent Results (from the past 240 hour(s))  Surgical pcr screen     Status: None   Collection Time: 09/16/22  9:00 AM   Specimen: Nasal Mucosa; Nasal Swab  Result Value Ref Range Status   MRSA, PCR NEGATIVE NEGATIVE Final   Staphylococcus aureus NEGATIVE NEGATIVE Final    Comment: (NOTE) The Xpert SA Assay (FDA approved for NASAL specimens in patients 86 years of age and older), is one component of Evon Lopezperez comprehensive surveillance program. It is not intended to diagnose infection nor to guide or monitor treatment. Performed at [facility], [place] [address], [place], [place] [address]          Radiology Studies: No results found.      Scheduled Meds:  arformoterol  15 mcg Nebulization BID   atorvastatin  80 mg Oral Daily   budesonide (PULMICORT) nebulizer solution  0.5 mg Nebulization BID   carvedilol  6.25 mg Oral BID WC   Chlorhexidine Gluconate Cloth  6 each Topical Q0600   clopidogrel  75 mg Oral Daily   dronabinol  2.5 mg Oral BID AC   escitalopram  10 mg Oral Daily   Gerhardt's butt cream   Topical BID   heparin injection  (subcutaneous)  5,000 Units Subcutaneous Q8H   insulin aspart  0-6 Units Subcutaneous TID WC   insulin aspart  2 Units Subcutaneous TID WC   insulin glargine-yfgn  5 Units Subcutaneous BID   metroNIDAZOLE  500 mg Oral Q12H   mirtazapine  7.5 mg  Oral QHS   multivitamin with minerals  1 tablet Oral Daily   nicotine  14 mg Transdermal Daily   oxyCODONE  10 mg Oral Q12H   pantoprazole  40 mg Oral Daily   revefenacin  175 mcg Nebulization Daily   senna-docusate  1 tablet Oral QHS   sodium chloride flush  10-40 mL Intracatheter Q12H   sodium chloride flush  10-40 mL Intracatheter Q12H   sodium hypochlorite   Irrigation Daily   Continuous Infusions:  ceFEPime (MAXIPIME) IV 2 g (09/18/22 1022)     LOS: 45 days    Time spent: over 30 min    Fayrene Helper, MD [facility]   To contact the attending provider between 7A-7P or the covering provider during after hours 7P-7A, please log into the web site [email or web address] and access using universal [place] password for that web site. If you do not have the password, please call the hospital operator.  09/18/2022, 11:40 AM

## 2022-09-18 NOTE — Progress Notes (Signed)
PHARMACY CONSULT NOTE FOR:  OUTPATIENT  PARENTERAL ANTIBIOTIC THERAPY (OPAT)  Indication: infected groin wound with bovine patch  Regimen: cefepime 2g IV Q8H + Flagyl 500 mg PO Q12H End date: 10/28/22  (6w from 1/23)   NOTE: IV abx will be followed by oral ciprofloxacin for suppression. This will be started in the outpatient setting.   IV antibiotic discharge orders are pended. To discharging provider:  please sign these orders via discharge navigator,  Select New Orders & click on the button choice - Manage This Unsigned Work.     Thank you for allowing pharmacy to be a part of this patient's care.  Adria Dill, PharmD PGY-2 Infectious Diseases Resident  09/18/2022 9:40 AM

## 2022-09-18 NOTE — Progress Notes (Signed)
Diagnosis: Vascular graft infection (bilateral groin surgical site infection) Right groin bovine patch involved; left groin didn't track down to bovine patch  Last debridement 1/23  Culture Result: 12/21 wound cx ecoli, bacteroides species, proteus   Patient had ongoing discharge despite cefazolin/flagyl and surgical team I&D 1/23 but cultures were not sent. Team had asked for empiric cefepime change before that  Unclear if source control with continued discharge leading to 1/23 debridement, or if truly had another organism.   Surgery had wanted 6 weeks abx with a suppressive tail  ID will sign off.  Discussed with dr Bjorn Pippin primary team   OPAT Orders Discharge antibiotics to be given via PICC line Discharge antibiotics: cefepime 2 gram q8hr   Duration: 6 weeks from 1/23  End Date: 10/28/22  [facility] Care Per Protocol:  Home health RN for IV administration and teaching; PICC line care and labs.    Labs weekly while on IV antibiotics: _x_ CBC with differential __ BMP _x_ CMP _x_ CRP __ ESR __ Vancomycin trough __ CK  _x_ Please pull PIC at completion of IV antibiotics __ Please leave PIC in place until doctor has seen patient or been notified  Fax weekly labs to [phone]  Clinic Follow Up Appt: 2/06 @ 930 am with Dr West Bali  @  [facility], [place], [place] [address] Phone: [phone]

## 2022-09-19 ENCOUNTER — Inpatient Hospital Stay: Payer: Self-pay

## 2022-09-19 DIAGNOSIS — I739 Peripheral vascular disease, unspecified: Secondary | ICD-10-CM | POA: Diagnosis not present

## 2022-09-19 LAB — COMPREHENSIVE METABOLIC PANEL
ALT: 6 U/L (ref 0–44)
AST: 13 U/L — ABNORMAL LOW (ref 15–41)
Albumin: 2.1 g/dL — ABNORMAL LOW (ref 3.5–5.0)
Alkaline Phosphatase: 67 U/L (ref 38–126)
Anion gap: 6 (ref 5–15)
BUN: 9 mg/dL (ref 8–23)
CO2: 23 mmol/L (ref 22–32)
Calcium: 8.7 mg/dL — ABNORMAL LOW (ref 8.9–10.3)
Chloride: 108 mmol/L (ref 98–111)
Creatinine, Ser: 0.6 mg/dL (ref 0.44–1.00)
GFR, Estimated: >60 mL/min (ref >60)
Glucose, Bld: 115 mg/dL — ABNORMAL HIGH (ref 70–99)
Potassium: 3.7 mmol/L (ref 3.5–5.1)
Sodium: 137 mmol/L (ref 135–145)
Total Bilirubin: 0.5 mg/dL (ref 0.3–1.2)
Total Protein: 5.6 g/dL — ABNORMAL LOW (ref 6.5–8.1)

## 2022-09-19 LAB — CBC WITH DIFFERENTIAL/PLATELET
Abs Immature Granulocytes: 0.04 10*3/uL (ref 0.00–0.07)
Basophils Absolute: 0 10*3/uL (ref 0.0–0.1)
Basophils Relative: 1 %
Eosinophils Absolute: 0.2 10*3/uL (ref 0.0–0.5)
Eosinophils Relative: 4 %
HCT: 31.6 % — ABNORMAL LOW (ref 36.0–46.0)
Hemoglobin: 9.8 g/dL — ABNORMAL LOW (ref 12.0–15.0)
Immature Granulocytes: 1 %
Lymphocytes Relative: 19 %
Lymphs Abs: 1.1 10*3/uL (ref 0.7–4.0)
MCH: 28.9 pg (ref 26.0–34.0)
MCHC: 31 g/dL (ref 30.0–36.0)
MCV: 93.2 fL (ref 80.0–100.0)
Monocytes Absolute: 0.8 10*3/uL (ref 0.1–1.0)
Monocytes Relative: 15 %
Neutro Abs: 3.4 10*3/uL (ref 1.7–7.7)
Neutrophils Relative %: 60 %
Platelets: 537 10*3/uL — ABNORMAL HIGH (ref 150–400)
RBC: 3.39 MIL/uL — ABNORMAL LOW (ref 3.87–5.11)
RDW: 15.5 % (ref 11.5–15.5)
WBC: 5.6 10*3/uL (ref 4.0–10.5)
nRBC: 0 % (ref 0.0–0.2)

## 2022-09-19 LAB — GLUCOSE, CAPILLARY
Glucose-Capillary: 123 mg/dL — ABNORMAL HIGH (ref 70–99)
Glucose-Capillary: 178 mg/dL — ABNORMAL HIGH (ref 70–99)
Glucose-Capillary: 258 mg/dL — ABNORMAL HIGH (ref 70–99)
Glucose-Capillary: 162 mg/dL — ABNORMAL HIGH (ref 70–99)

## 2022-09-19 LAB — MAGNESIUM: Magnesium: 1.7 mg/dL (ref 1.7–2.4)

## 2022-09-19 LAB — PHOSPHORUS: Phosphorus: 2.2 mg/dL — ABNORMAL LOW (ref 2.5–4.6)

## 2022-09-19 LAB — C-REACTIVE PROTEIN: CRP: 2.4 mg/dL — ABNORMAL HIGH (ref <1.0)

## 2022-09-19 MED ORDER — NIRMATRELVIR/RITONAVIR (PAXLOVID)TABLET
3.0000 | ORAL_TABLET | Freq: Two times a day (BID) | ORAL | Status: DC
  Administered 2022-09-19 (×3): 3 via ORAL
  Filled 2022-09-19: qty 30

## 2022-09-19 MED ORDER — SODIUM CHLORIDE 0.9% FLUSH
10.0000 mL | Freq: Two times a day (BID) | INTRAVENOUS | Status: DC
  Administered 2022-09-19 – 2022-09-22 (×4): 10 mL

## 2022-09-19 MED ORDER — SODIUM CHLORIDE 0.9% FLUSH: 10.0000 mL | INTRAVENOUS | Status: DC | PRN

## 2022-09-19 MED ORDER — TRAZODONE HCL 50 MG PO TABS
25.0000 mg | ORAL_TABLET | Freq: Every evening | ORAL | Status: DC | PRN
  Administered 2022-09-19 – 2022-09-21 (×3): 25 mg via ORAL
  Filled 2022-09-19 (×2): qty 1

## 2022-09-19 NOTE — Consult Note (Signed)
[facility] Nurse Consult Note: Reason for Consult:change NPWT Wound type:  bilateral groin wounds postop washout by vascular 09/16/2022 Measurement:L groin 6 cms x 5 cms x 2 cms; R groin 7 cms x 5 cms x 2.4 cms  Wound bed: L groin 100% red, R groin 80% red 20% yellow fibrin  Drainage (amount, consistency, odor)  minimal sanguinous  Periwound: intact  Dressing procedure/placement/frequency:  Old NPWT had been removed by vascular earlier in day. They had placed wet to dry dressings.  Cleansed wounds with normal saline  L groin 1 piece of black foam, 1 piece of white foam R groin  1 piece black foam, 1 piece white foam  Sealed NPWT dressing at 155m HG  Patient received PO pain medication per bedside nurse prior to dressing change Patient tolerated procedure well  WWhite Stonenurse will continue to provide NPWT dressing changed due to the complexity of the dressing change. Next NPWT change Monday 09/22/2022, supplies ordered.    Thank you,     Zanylah Hardie MSN, RN-BC, [employer]

## 2022-09-19 NOTE — Progress Notes (Signed)
  Progress Note    09/19/2022 8:42 AM 3 Days Post-Op  Subjective:  says she is not doing well. Wanting [place] to come back later to remove VACs. Newly Covid +   Vitals:   09/19/22 0510 09/19/22 0829  BP: (!) 194/69 137/71  Pulse: 68 72  Resp: 20 20  Temp: 98.1 F (36.7 C) 99.1 F (37.3 C)  SpO2: 97% 95%   Physical Exam: Cardiac:  regular Lungs:  non labored Incisions:  Bilateral groin wounds well appearing. Healthy granulation tissue in wound beds. No tunneling. No purulence Extremities:  Left AKA healing well. RLE well perfused and warm Abdomen:  obese, soft Neurologic: alert and oriented  CBC    Component Value Date/Time   WBC 5.6 09/19/2022 0227   RBC 3.39 (L) 09/19/2022 0227   HGB 9.8 (L) 09/19/2022 0227   HGB 14.3 06/20/2012 2127   HCT 31.6 (L) 09/19/2022 0227   HCT 42.9 06/20/2012 2127   PLT 537 (H) 09/19/2022 0227   PLT 357 06/20/2012 2127   MCV 93.2 09/19/2022 0227   MCV 91 06/20/2012 2127   MCH 28.9 09/19/2022 0227   MCHC 31.0 09/19/2022 0227   RDW 15.5 09/19/2022 0227   RDW 13.6 06/20/2012 2127   LYMPHSABS 1.1 09/19/2022 0227   MONOABS 0.8 09/19/2022 0227   EOSABS 0.2 09/19/2022 0227   BASOSABS 0.0 09/19/2022 0227    BMET    Component Value Date/Time   NA 137 09/19/2022 0227   NA 135 (L) 06/20/2012 2127   K 3.7 09/19/2022 0227   K 4.0 06/20/2012 2127   CL 108 09/19/2022 0227   CL 100 06/20/2012 2127   CO2 23 09/19/2022 0227   CO2 27 06/20/2012 2127   GLUCOSE 115 (H) 09/19/2022 0227   GLUCOSE 250 (H) 06/20/2012 2127   BUN 9 09/19/2022 0227   BUN 20 (H) 06/20/2012 2127   CREATININE 0.60 09/19/2022 0227   CREATININE 0.60 06/20/2012 2127   CALCIUM 8.7 (L) 09/19/2022 0227   CALCIUM 8.8 06/20/2012 2127   GFRNONAA >60 09/19/2022 0227   GFRNONAA >60 06/20/2012 2127   GFRAA >60 06/20/2012 2127    INR    Component Value Date/Time   INR 1.0 08/03/2022 2156     Intake/Output Summary (Last 24 hours) at 09/19/2022 0842 Last data filed at  09/19/2022 0531 Gross per 24 hour  Intake --  Output 1400 ml  Net -1400 ml     Assessment/Plan:  72 y.o. female is s/p  BLE revascularization, L AKA, and bilateral groin washout x2 with wound vac placement     Bilateral groin wound VACs removed. Wounds healthy appearing with granulation tissue. No tunneling appreciated. No purulence. WOC RN will return to replace VACs later this morning Newly COVID+ On cefepime and flagyl per ID Will plan to look at wounds again on Monday  Heather Caldwell, PA-C [facility] [phone] 09/19/2022 8:42 AM

## 2022-09-19 NOTE — Progress Notes (Signed)
Peripherally Inserted Central Catheter Placement  The IV Nurse has discussed with the patient and/or persons authorized to consent for the patient, the purpose of this procedure and the potential benefits and risks involved with this procedure.  The benefits include less needle sticks, lab draws from the catheter, and the patient may be discharged home with the catheter. Risks include, but not limited to, infection, bleeding, blood clot (thrombus formation), and puncture of an artery; nerve damage and irregular heartbeat and possibility to perform a PICC exchange if needed/ordered by physician.  Alternatives to this procedure were also discussed.  Bard Power PICC patient education guide, fact sheet on infection prevention and patient information card has been provided to patient /or left at bedside.    PICC Placement Documentation  PICC Double Lumen 86/75/44 Left Basilic 40 cm 0 cm (Active)  Indication for Insertion or Continuance of Line Poor Vasculature-patient has had multiple peripheral attempts or PIVs lasting less than 24 hours 09/19/22 2127  Exposed Catheter (cm) 0 cm 09/19/22 2127  Site Assessment Clean, Dry, Intact 09/19/22 2127  Lumen #1 Status Flushed;Blood return noted;Saline locked 09/19/22 2127  Lumen #2 Status Flushed;Blood return noted;Saline locked 09/19/22 2127  Dressing Type Transparent;Securing device 09/19/22 2127  Dressing Status Antimicrobial disc in place;Clean, Dry, Intact 09/19/22 2127  Safety Lock Not Applicable 92/01/00 7121  Line Care Connections checked and tightened 09/19/22 2127  Line Adjustment (NICU/IV Team Only) No 09/19/22 2127  Dressing Intervention New dressing 09/19/22 2127  Dressing Change Due 09/26/22 09/19/22 2127       Thailyn Khalid, Cathlyn Parsons 09/19/2022, 9:32 PM

## 2022-09-19 NOTE — Progress Notes (Signed)
PROGRESS NOTE    Heather [name]  OMV:[id number] DOB: Jan 12, 1951 DOA: 08/04/2022 PCP: Heather Pac, FNP  No chief complaint on file.   Brief Narrative:  72 year old F with PMH of DM-2, diastolic CHF, COPD, HTN, HLD, GERD, PAD, AS s/p TAVR, tobacco use disorder, anxiety, depression and schizophrenia presented to ED on 12/11 with SBP in 200s, dizziness, headache and blurry vision, and admitted to ICU for hemorrhagic cerebral stroke and BLE ischemia.  She underwent BLE femoral endarterectomy and patch angioplasty with iliac stenting and 4 compartment fasciotomies.  Postop, she remained on vent and Cleviprex.  Eventually, she was extubated and transferred to hospitalist service on 12/17.  She also underwent left AKA and excisional debridement of bilateral groin wounds with VAC placement on 12/21.  Hospital course and then noteworthy for episode of hypoglycemia with insulin adjustments.  Initially required tube feed that was later discontinued.  Remains physically deconditioned.  Wound VAC discontinued on 1/18.  She is now WTD dressing with silver to bilateral groins. Concern about persistent or worsening greenish drainage with tunneling.  Underwent groin debridement and wound VAC placement on 1/23.  Antibiotics broadened to IV cefepime and Flagyl per ID and VVS.   Assessment & Plan:   Principal Problem:   PVD (peripheral vascular disease) (HCC) Active Problems:   Hypertension, benign   S/P TAVR (transcatheter aortic valve replacement)   Tobacco use disorder   PAD (peripheral artery disease) (HCC)   ICH (intracerebral hemorrhage) (HCC)   Abscess of anal and rectal regions   Decubitus ulcer of sacral region, stage 2 (Heather [name])   Pulmonary edema   Right groin wound   Wound of left groin   Decreased oral intake   Chronic diastolic CHF (congestive heart failure) (HCC)   Dysphagia   Surgical site infection  Malaise  Myalgias  COVID 19 viral infection Remains afebrile, normal leukocytosis Paxlovid  after discussion of risks and benefits - statin currently on hold  Peripheral arterial disease/bilateral lower extremity ischemia -Bilateral CFA endarterectomy with bovine patch angioplasty -Bilateral iliofemoral embolectomy, stenting on 12/11 -Bilateral CIA, stent Rt EIA and 4 compartment fasciotomies on 12/11 -Left AKA and excisional debridement of bilateral groin wounds with VAC placement 08/15/23 -1/21-concern about persistent or worsening greenish drainage with tunneling of left groin wound -1/23 s/p bilateral groin wound washout, soft tissue debridement, myriad morcell powder placement, vac dressing placement -cefepime/flagyl x 6 weeks per ID, appreciate recs -Continue Plavix and statin. -Patient has left arm midline in place.  -Tylenol, oxycodone, Robaxin and Dilaudid for pain control   Acute intracranial hemorrhage: MRI showed acute intraparenchymal hematoma in cerebellar vermis measuring 11 cc in volume and mild mass effect upon the fourth ventricle. Seen by neurology.  Neurologically stable   Acute respiratory failure with hypoxia COPD/tobacco use disorder: -Extubated 12/14> On room air currently. -Continue current [place] Pulmicort,Yupleri and albuterol as needed.   Chronic diastolic CHF/HTN: TTE with LVEF of 60 to 65%, indeterminate DD.  Appears euvolemic on exam. -Continue Coreg.   Uncontrolled NIDDM-2 with hyperglycemia and hypoglycemia: A1c 12.9% on 12/12.  CBG within acceptable range. -Continue current insulin regimen -Continue statin (on hold with paxlovid)   Essential hypertension: Mild hypotension earlier this morning. -Decreased Coreg to 6.25 mg twice daily    Hx of CAD/HLD: Stable. -Continue Coreg, Lipitor and Plavix   AS s/p TAVR: Noted on stable.   Acute blood loss anemia in the setting of surgery: s/p 3 units PRBC so far. Hb stable -Monitor intermittently.   Depression,bipolar,schizophrenia,insomnia, chronic back  pain: mood has been stable. -Continue  Lexapro, Remeron and trazodone -Pain medications as above.    Dysphagia:Likely related to [place]. Cortrack has been removed. She does not want to have NG tube or tube feeding. Family encouraged for oral nutrition.  Dysphagia seems to have resolved. -Upgraded to regular diet   Increased nutrient needs/decreased oral intake: Body mass index is 23.83 kg/m. Nutrition Problem: Increased nutrient needs Etiology: wound healing Signs/Symptoms: estimated needs Interventions: Refer to RD note for recommendations   Pressure skin injury: POA Pressure Injury 08/04/22 Sacrum Stage 2 -  Partial thickness loss of dermis presenting as Heather [name] shallow open injury with Heather [name] red, pink wound bed without slough. (Active)  08/04/22 0100  Location: Sacrum  Location Orientation:   Staging: Stage 2 -  Partial thickness loss of dermis presenting as Heather [name] shallow open injury with Heather [name] red, pink wound bed without slough.  Wound Description (Comments):   Present on Admission: Yes      DVT prophylaxis: heparin Code Status: full Family Communication: none - called husband no answer 1/24 Disposition:   Status is: Inpatient Remains inpatient appropriate because: continued inpatient management   Consultants:  ID vascular  Procedures:  1/23 Bilateral groin wound washout Soft tissue debridement Myriad Morcell powder placement 2500g VAC dressing placement  Antimicrobials:  Anti-infectives (From admission, onward)    Start     Dose/Rate Route Frequency Ordered Stop   09/19/22 0100  nirmatrelvir/ritonavir (PAXLOVID) 3 tablet        3 tablet Oral 2 times daily 09/19/22 0003 09/23/22 2159   09/16/22 1600  ceFEPIme (MAXIPIME) 2 g in sodium chloride 0.9 % 100 mL IVPB        2 g 200 mL/hr over 30 Minutes Intravenous Every 8 hours 09/16/22 1232     09/09/22 1100  metroNIDAZOLE (FLAGYL) tablet 500 mg        500 mg Oral Every 12 hours 09/09/22 1002     08/20/22 2200  metroNIDAZOLE (FLAGYL) IVPB 500 mg  Status:  Discontinued         500 mg 100 mL/hr over 60 Minutes Intravenous Every 12 hours 08/20/22 1110 08/20/22 1111   08/20/22 2200  metroNIDAZOLE (FLAGYL) tablet 500 mg  Status:  Discontinued        500 mg Per Tube Every 12 hours 08/20/22 1111 09/09/22 1002   08/18/22 2300  ceFAZolin (ANCEF) IVPB 2g/100 mL premix  Status:  Discontinued        2 g 200 mL/hr over 30 Minutes Intravenous Every 8 hours 08/18/22 1455 09/16/22 1232   08/18/22 2300  metroNIDAZOLE (FLAGYL) IVPB 500 mg  Status:  Discontinued        500 mg 100 mL/hr over 60 Minutes Intravenous Every 6 hours 08/18/22 1455 08/20/22 1110   08/15/22 1100  vancomycin (VANCOCIN) IVPB 1000 mg/200 mL premix  Status:  Discontinued        1,000 mg 200 mL/hr over 60 Minutes Intravenous Every 24 hours 08/14/22 1006 08/15/22 1307   08/14/22 1400  piperacillin-tazobactam (ZOSYN) IVPB 3.375 g  Status:  Discontinued        3.375 g 12.5 mL/hr over 240 Minutes Intravenous Every 8 hours 08/14/22 1006 08/18/22 1455   08/14/22 1100  vancomycin (VANCOREADY) IVPB 1500 mg/300 mL        1,500 mg 150 mL/hr over 120 Minutes Intravenous  Once 08/14/22 1006 08/14/22 1502   08/04/22 1045  ceFAZolin (ANCEF) IVPB 2g/100 mL premix        2 g  200 mL/hr over 30 Minutes Intravenous On call to O.R. 08/04/22 0958 08/04/22 1100       Subjective: Feels sore, run down  Objective: Vitals:   09/19/22 0510 09/19/22 0829 09/19/22 1122 09/19/22 1613  BP: (!) 194/69 137/71 (!) 177/47 (!) 92/50  Pulse: 68 72 67 83  Resp: '20 20 18 18  '$ Temp: 98.1 F (36.7 C) 99.1 F (37.3 C) 98.4 F (36.9 C) 99 F (37.2 C)  TempSrc: Oral Axillary Oral Oral  SpO2: 97% 95% 95% 93%  Weight:      Height:        Intake/Output Summary (Last 24 hours) at 09/19/2022 1628 Last data filed at 09/19/2022 0531 Gross per 24 hour  Intake --  Output 500 ml  Net -500 ml   Filed Weights   09/10/22 0403 09/16/22 0005 09/16/22 0838  Weight: 62.6 kg 59.4 kg 55.3 kg    Examination:  General: No acute  distress. Cardiovascular: RRR Lungs: unlabored Abdomen: Soft, nontender, nondistended Neurological: Alert and oriented 3. Moves all extremities 4. Cranial nerves II through XII grossly intact. Skin: wound vacs to bilateral groins Extremities: L AKA    Data Reviewed: I have personally reviewed following labs and imaging studies  CBC: Recent Labs  Lab 09/17/22 0329 09/18/22 0748 09/19/22 0227  WBC 8.1 6.6 5.6  NEUTROABS  --   --  3.4  HGB 9.4* 9.1* 9.8*  HCT 30.8* 29.8* 31.6*  MCV 93.6 93.1 93.2  PLT 475* 525* 537*    Basic Metabolic Panel: Recent Labs  Lab 09/15/22 0102 09/17/22 0329 09/18/22 0748 09/19/22 0227  NA 134* 134* 136 137  K 3.9 4.9 4.4 3.7  CL 103 104 105 108  CO2 27 21* 22 23  GLUCOSE 160* 333* 172* 115*  BUN 7* '15 13 9  '$ CREATININE 0.67 0.86 0.74 0.60  CALCIUM 8.3* 8.1* 8.5* 8.7*  MG  --  1.8  --  1.7  PHOS  --  4.0  --  2.2*    GFR: Estimated Creatinine Clearance: 51.8 mL/min (by C-G formula based on SCr of 0.6 mg/dL).  Liver Function Tests: Recent Labs  Lab 09/17/22 0329 09/19/22 0227  AST  --  13*  ALT  --  6  ALKPHOS  --  67  BILITOT  --  0.5  PROT  --  5.6*  ALBUMIN 2.1* 2.1*    CBG: Recent Labs  Lab 09/18/22 1117 09/18/22 1734 09/19/22 0528 09/19/22 1121 09/19/22 1611  GLUCAP 220* 118* 123* 178* 258*     Recent Results (from the past 240 hour(s))  Surgical pcr screen     Status: None   Collection Time: 09/16/22  9:00 AM   Specimen: Nasal Mucosa; Nasal Swab  Result Value Ref Range Status   MRSA, PCR NEGATIVE NEGATIVE Final   Staphylococcus aureus NEGATIVE NEGATIVE Final    Comment: (NOTE) The Xpert SA Assay (FDA approved for NASAL specimens in patients 9 years of age and older), is one component of Vauda Salvucci comprehensive surveillance program. It is not intended to diagnose infection nor to guide or monitor treatment. Performed at [facility], [place] [address] [place], [place] [address]   Resp panel by RT-PCR  (RSV, Flu Sevin Farone&B, Covid) Anterior Nasal Swab     Status: Abnormal   Collection Time: 09/18/22 11:40 AM   Specimen: Anterior Nasal Swab  Result Value Ref Range Status   SARS Coronavirus 2 by RT PCR POSITIVE (Raiden Haydu) NEGATIVE Final    Comment: (NOTE) SARS-CoV-2  target nucleic acids are DETECTED.  The SARS-CoV-2 RNA is generally detectable in upper respiratory specimens during the acute phase of infection. Positive results are indicative of the presence of the identified virus, but do not rule out bacterial infection or co-infection with other pathogens not detected by the test. Clinical correlation with patient history and other diagnostic information is necessary to determine patient infection status. The expected result is Negative.  Fact Sheet for Patients: EntrepreneurPulse.com.au  Fact Sheet for Healthcare Providers: IncredibleEmployment.be  This test is not yet approved or cleared by the [place] FDA and  has been authorized for detection and/or diagnosis of SARS-CoV-2 by FDA under an Emergency Use Authorization (EUA).  This EUA will remain in effect (meaning this test can be used) for the duration of  the COVID-19 declaration under Section 564(b)(1) of the [place] [place], 21 U.S.C. section 360bbb-3(b)(1), unless the authorization is terminated or revoked sooner.     Influenza [place] by PCR NEGATIVE NEGATIVE Final   Influenza B by PCR NEGATIVE NEGATIVE Final    Comment: (NOTE) The Xpert Xpress SARS-CoV-2/FLU/RSV plus assay is intended as an aid in the diagnosis of influenza from Nasopharyngeal swab specimens and should not be used as Virat Prather sole basis for treatment. Nasal washings and aspirates are unacceptable for Xpert Xpress SARS-CoV-2/FLU/RSV testing.  Fact Sheet for Patients: EntrepreneurPulse.com.au  Fact Sheet for Healthcare Providers: IncredibleEmployment.be  This test is not yet approved or cleared by the [place] FDA and has been authorized for detection and/or diagnosis of SARS-CoV-2 by FDA under an Emergency Use Authorization (EUA). This EUA will remain in effect (meaning this test can be used) for the duration of the COVID-19 declaration under Section 564(b)(1) of the Act, 21 U.S.C. section 360bbb-3(b)(1), unless the authorization is terminated or revoked.     Resp Syncytial Virus by PCR NEGATIVE NEGATIVE Final    Comment: (NOTE) Fact Sheet for Patients: EntrepreneurPulse.com.au  Fact Sheet for Healthcare Providers: IncredibleEmployment.be  This test is not yet approved or cleared by the [place] FDA and has been authorized for detection and/or diagnosis of SARS-CoV-2 by FDA under an Emergency Use Authorization (EUA). This EUA will remain in effect (meaning this test can be used) for the duration of the COVID-19 declaration under Section 564(b)(1) of the Act, 21 U.S.C. section 360bbb-3(b)(1), unless the authorization is terminated or revoked.  Performed at [facility], [place] [address] [place], [place] [address]          Radiology Studies: [place] EKG SITE RITE  Result Date: 09/19/2022 If Site Rite image not attached, placement could not be confirmed due to current cardiac rhythm.  DG CHEST PORT 1 VIEW  Result Date: 09/18/2022 CLINICAL DATA:  COVID EXAM: PORTABLE CHEST 1 VIEW COMPARISON:  None Available. FINDINGS: The heart size and mediastinal contours are within normal limits. Patient is status post TAVR, unchanged. Both lungs are clear. The visualized skeletal structures are unremarkable. IMPRESSION: No active disease. Electronically Signed   By: Ronney Asters M.D.   On: 09/18/2022 15:34        Scheduled Meds:  arformoterol  15 mcg Nebulization BID   budesonide (PULMICORT) nebulizer solution  0.5 mg Nebulization BID   carvedilol  6.25 mg Oral BID WC   Chlorhexidine Gluconate Cloth  6 each Topical Q0600   clopidogrel  75  mg Oral Daily   dronabinol  2.5 mg Oral BID AC   escitalopram  10 mg Oral Daily   Gerhardt's butt cream   Topical  BID   heparin injection (subcutaneous)  5,000 Units Subcutaneous Q8H   insulin aspart  0-6 Units Subcutaneous TID WC   insulin aspart  2 Units Subcutaneous TID WC   insulin glargine-yfgn  5 Units Subcutaneous BID   metroNIDAZOLE  500 mg Oral Q12H   mirtazapine  7.5 mg Oral QHS   multivitamin with minerals  1 tablet Oral Daily   nicotine  14 mg Transdermal Daily   nirmatrelvir/ritonavir  3 tablet Oral BID   oxyCODONE  10 mg Oral Q12H   pantoprazole  40 mg Oral Daily   revefenacin  175 mcg Nebulization Daily   senna-docusate  1 tablet Oral QHS   sodium chloride flush  10-40 mL Intracatheter Q12H   sodium chloride flush  10-40 mL Intracatheter Q12H   sodium hypochlorite   Irrigation Daily   Continuous Infusions:  ceFEPime (MAXIPIME) IV 2 g (09/19/22 0823)     LOS: 46 days    Time spent: over 30 min    Fayrene Helper, MD [facility]   To contact the attending provider between 7A-7P or the covering provider during after hours 7P-7A, please log into the web site [email or web address] and access using universal [place] password for that web site. If you do not have the password, please call the hospital operator.  09/19/2022, 4:28 PM

## 2022-09-19 NOTE — Progress Notes (Signed)
Physical Therapy Treatment Patient Details Name: Heather [name] MRN: [id number] DOB: August 29, 1950 Today's Date: 09/19/2022   History of Present Illness Pt is a 72 y.o. female admitted 08/04/22 with headache, dizziness. 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  Found to have mottled LE's after given Narcdipine due to HTN; s/p emergent common femoral and iliofemoral endarterectomy and bilateral 4 compartment fasciotomies on 12/11. 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 S/p groin washout/wound vac placement 1/23. PMH includes smoker(2ppd), severe AI/AS s/p TAVR, CAD s/p PCI and on DAPT, COPD, PAD, HTN.    PT Comments    Pt feeling bad from Covid but agreeable to work some on mobility. Worked on sitting balance and tried scooting along EOB in preparation for scoot transfer. Continue to recommend SNF.    Recommendations for follow up therapy are one component of a multi-disciplinary discharge planning process, led by the attending physician.  Recommendations may be updated based on patient status, additional functional criteria and insurance authorization.  Follow Up Recommendations  Skilled nursing-short term rehab (<3 hours/day) Can patient physically be transported by private vehicle: No   Assistance Recommended at Discharge Frequent or constant Supervision/Assistance  Patient can return home with the following Two people to help with walking and/or transfers;Two people to help with bathing/dressing/bathroom;Help with stairs or ramp for entrance;Assist for transportation;Assistance with cooking/housework   Equipment Recommendations  Other (comment) (TBD)    Recommendations for Other Services       Precautions / Restrictions Precautions Precautions: Fall;Other (comment) Precaution Comments: multiple buttocks wounds, B groin wound vac     Mobility  Bed Mobility Overal bed mobility: Needs Assistance Bed Mobility: Rolling, Sidelying to Sit, Sit  to Sidelying Rolling: Mod assist, Min assist Sidelying to sit: Mod assist     Sit to sidelying: Mod assist General bed mobility comments: min assist to roll rt and mod assist to roll lt. Assist to elevate trunk into sitting and bring hips to EOB. Assist to lower trunk and bring RLE up into bed.    Transfers                   General transfer comment: Elevated entire bed and placed chair under pt's rt foot to allow her to push with her rt leg. Scooted up toward head of bed with +2 max/total assist using bed pad under hips.    Ambulation/Gait                   Stairs             Wheelchair Mobility    Modified Rankin (Stroke Patients Only)       Balance Overall balance assessment: Needs assistance Sitting-balance support: No upper extremity supported, Single extremity supported, Bilateral upper extremity supported Sitting balance-Leahy Scale: Poor Sitting balance - Comments: Needed UE support intermittently while sitting EOB Postural control: Posterior lean                                  Cognition Arousal/Alertness: Awake/alert Behavior During Therapy: Flat affect Overall Cognitive Status: Impaired/Different from baseline Area of Impairment: Following commands, Safety/judgement, Attention, Memory, Awareness, Problem solving                   Current Attention Level: Selective Memory: Decreased short-term memory Following Commands: Follows one step commands consistently, Follows one step commands with increased  time Safety/Judgement: Decreased awareness of safety, Decreased awareness of deficits Awareness: Emergent Problem Solving: Slow processing, Decreased initiation, Requires verbal cues, Difficulty sequencing          Exercises      General Comments        Pertinent Vitals/Pain Pain Assessment Pain Assessment: Faces Faces Pain Scale: Hurts a little bit Pain Location: back Pain Descriptors / Indicators:  Grimacing, Guarding, Discomfort Pain Intervention(s): Limited activity within patient's tolerance, Monitored during session, Repositioned    Home Living                          Prior Function            PT Goals (current goals can now be found in the care plan section) Progress towards PT goals: Not progressing toward goals - comment    Frequency    Min 2X/week      PT Plan Current plan remains appropriate    Co-evaluation              AM-PAC PT "6 Clicks" Mobility   Outcome Measure  Help needed turning from your back to your side while in a flat bed without using bedrails?: A Lot Help needed moving from lying on your back to sitting on the side of a flat bed without using bedrails?: A Lot Help needed moving to and from a bed to a chair (including a wheelchair)?: Total Help needed standing up from a chair using your arms (e.g., wheelchair or bedside chair)?: Total Help needed to walk in hospital room?: Total Help needed climbing 3-5 steps with a railing? : Total 6 Click Score: 8    End of Session   Activity Tolerance: Patient limited by fatigue Patient left: in bed;with call bell/phone within reach;with bed alarm set   PT Visit Diagnosis: Muscle weakness (generalized) (M62.81);Other abnormalities of gait and mobility (R26.89)     Time: 8786-7672 PT Time Calculation (min) (ACUTE ONLY): 17 min  Charges:  $Therapeutic Activity: 8-22 mins                     [place] [facility] [place] 09/19/2022, 2:43 PM

## 2022-09-20 DIAGNOSIS — I739 Peripheral vascular disease, unspecified: Secondary | ICD-10-CM | POA: Diagnosis not present

## 2022-09-20 LAB — CBC WITH DIFFERENTIAL/PLATELET
Abs Immature Granulocytes: 0.07 10*3/uL (ref 0.00–0.07)
Basophils Absolute: 0 10*3/uL (ref 0.0–0.1)
Basophils Relative: 1 %
Eosinophils Absolute: 0.3 10*3/uL (ref 0.0–0.5)
Eosinophils Relative: 5 %
HCT: 29.3 % — ABNORMAL LOW (ref 36.0–46.0)
Hemoglobin: 9.2 g/dL — ABNORMAL LOW (ref 12.0–15.0)
Immature Granulocytes: 1 %
Lymphocytes Relative: 19 %
Lymphs Abs: 1.2 10*3/uL (ref 0.7–4.0)
MCH: 28.8 pg (ref 26.0–34.0)
MCHC: 31.4 g/dL (ref 30.0–36.0)
MCV: 91.8 fL (ref 80.0–100.0)
Monocytes Absolute: 0.9 10*3/uL (ref 0.1–1.0)
Monocytes Relative: 15 %
Neutro Abs: 3.8 10*3/uL (ref 1.7–7.7)
Neutrophils Relative %: 59 %
Platelets: 542 10*3/uL — ABNORMAL HIGH (ref 150–400)
RBC: 3.19 MIL/uL — ABNORMAL LOW (ref 3.87–5.11)
RDW: 15.7 % — ABNORMAL HIGH (ref 11.5–15.5)
WBC: 6.2 10*3/uL (ref 4.0–10.5)
nRBC: 0 % (ref 0.0–0.2)

## 2022-09-20 LAB — COMPREHENSIVE METABOLIC PANEL
ALT: <5 U/L (ref 0–44)
AST: 13 U/L — ABNORMAL LOW (ref 15–41)
Albumin: 2.1 g/dL — ABNORMAL LOW (ref 3.5–5.0)
Alkaline Phosphatase: 70 U/L (ref 38–126)
Anion gap: 6 (ref 5–15)
BUN: 11 mg/dL (ref 8–23)
CO2: 26 mmol/L (ref 22–32)
Calcium: 8.4 mg/dL — ABNORMAL LOW (ref 8.9–10.3)
Chloride: 105 mmol/L (ref 98–111)
Creatinine, Ser: 0.77 mg/dL (ref 0.44–1.00)
GFR, Estimated: >60 mL/min (ref >60)
Glucose, Bld: 151 mg/dL — ABNORMAL HIGH (ref 70–99)
Potassium: 3.9 mmol/L (ref 3.5–5.1)
Sodium: 137 mmol/L (ref 135–145)
Total Bilirubin: 0.4 mg/dL (ref 0.3–1.2)
Total Protein: 5.3 g/dL — ABNORMAL LOW (ref 6.5–8.1)

## 2022-09-20 LAB — GLUCOSE, CAPILLARY
Glucose-Capillary: 148 mg/dL — ABNORMAL HIGH (ref 70–99)
Glucose-Capillary: 184 mg/dL — ABNORMAL HIGH (ref 70–99)
Glucose-Capillary: 275 mg/dL — ABNORMAL HIGH (ref 70–99)
Glucose-Capillary: 242 mg/dL — ABNORMAL HIGH (ref 70–99)

## 2022-09-20 LAB — MAGNESIUM: Magnesium: 1.7 mg/dL (ref 1.7–2.4)

## 2022-09-20 LAB — PHOSPHORUS: Phosphorus: 2.7 mg/dL (ref 2.5–4.6)

## 2022-09-20 NOTE — TOC Progression Note (Addendum)
Transition of Care [facility]) - Progression Note    Patient Details  Name: Heather [name] MRN: [id number] Date of Birth: Aug 09, 1951  Transition of Care [facility]) CM/SW [place], [place] Phone Number: 09/20/2022, 2:16 PM  Clinical Narrative:     Pt's daughter has chosen [facility], Josem Kaufmann has been started by SNF as of 09/12/22. TOC will check with [facility] on Monday to inquire on status of auth.   Expected Discharge Plan: Skilled Nursing Facility Barriers to Discharge: [place] (PASRR), Insurance Authorization, Continued Medical Work up, SNF Pending bed offer (cortrak)  Expected Discharge Plan and Services In-house Referral: Clinical Social Work   Post Acute Care Choice: Long Term Acute Care (LTAC) Living arrangements for the past 2 months: [place] Determinants of Health (SDOH) Interventions [place]: Food Insecurity Present (08/11/2022)  Housing: Low Risk  (08/11/2022)  Transportation Needs: No Transportation Needs (08/11/2022)  Utilities: Not At Risk (08/11/2022)  Financial Resource Strain: Medium Risk (10/19/2018)  Physical Activity: Inactive (10/19/2018)  Social Connections: Unknown (10/19/2018)  Tobacco Use: High Risk (09/17/2022)    Readmission Risk Interventions    11/13/2020    2:27 PM  Readmission Risk Prevention Plan  Transportation Screening Complete  PCP or Specialist Appt within 3-5 Days Complete  HRI or [place] Complete  Social Work Consult for [place] Planning/Counseling Complete  Palliative Care Screening Not Applicable  Medication Review Press photographer) Complete

## 2022-09-20 NOTE — Progress Notes (Signed)
PROGRESS NOTE    Heather [name]  ZHY:[id number] DOB: 05-30-1951 DOA: 08/04/2022 PCP: [name], FNP  No chief complaint on file.   Brief Narrative:  72 year old F with PMH of DM-2, diastolic CHF, COPD, HTN, HLD, GERD, PAD, AS s/p TAVR, tobacco use disorder, anxiety, depression and schizophrenia presented to ED on 12/11 with SBP in 200s, dizziness, headache and blurry vision, and admitted to ICU for hemorrhagic cerebral stroke and BLE ischemia.  She underwent BLE femoral endarterectomy and patch angioplasty with iliac stenting and 4 compartment fasciotomies.  Postop, she remained on vent and Cleviprex.  Eventually, she was extubated and transferred to hospitalist service on 12/17.  She also underwent left AKA and excisional debridement of bilateral groin wounds with VAC placement on 12/21.  Hospital course and then noteworthy for episode of hypoglycemia with insulin adjustments.  Initially required tube feed that was later discontinued.  Remains physically deconditioned.  Wound VAC discontinued on 1/18.  She is now WTD dressing with silver to bilateral groins. Concern about persistent or worsening greenish drainage with tunneling.  Underwent groin debridement and wound VAC placement on 1/23.  Antibiotics broadened to IV cefepime and Flagyl per ID and VVS.   Assessment & Plan:   Principal Problem:   PVD (peripheral vascular disease) (HCC) Active Problems:   Hypertension, benign   S/P TAVR (transcatheter aortic valve replacement)   Tobacco use disorder   PAD (peripheral artery disease) (HCC)   ICH (intracerebral hemorrhage) (HCC)   Abscess of anal and rectal regions   Decubitus ulcer of sacral region, stage 2 ([name])   Pulmonary edema   Right groin wound   Wound of left groin   Decreased oral intake   Chronic diastolic CHF (congestive heart failure) (HCC)   Dysphagia   Surgical site infection  Malaise  Myalgias  COVID 19 viral infection Remains afebrile, normal leukocytosis Paxlovid  after discussion of risks and benefits - statin currently on hold Sounds like she may decline further paxlovid after vomiting, will clarify  Peripheral arterial disease/bilateral lower extremity ischemia -Bilateral CFA endarterectomy with bovine patch angioplasty -Bilateral iliofemoral embolectomy, stenting on 12/11 -Bilateral CIA, stent Rt EIA and 4 compartment fasciotomies on 12/11 -Left AKA and excisional debridement of bilateral groin wounds with VAC placement 08/15/23 -1/21-concern about persistent or worsening greenish drainage with tunneling of left groin wound -1/23 s/p bilateral groin wound washout, soft tissue debridement, myriad morcell powder placement, vac dressing placement - vascular to reevaluate wound 1/29 -cefepime/flagyl x 6 weeks per ID, appreciate recs -Continue Plavix and statin. -Patient has left arm midline in place.  -Tylenol, oxycodone, Robaxin and Dilaudid for pain control   Acute intracranial hemorrhage: MRI showed acute intraparenchymal hematoma in cerebellar vermis measuring 11 cc in volume and mild mass effect upon the fourth ventricle. Seen by neurology.  Neurologically stable   Acute respiratory failure with hypoxia COPD/tobacco use disorder: -Extubated 12/14> On room air currently. -Continue current [place] Pulmicort,Yupleri and albuterol as needed.   Chronic diastolic CHF/HTN: TTE with LVEF of 60 to 65%, indeterminate DD.  Appears euvolemic on exam. -Continue Coreg.   Uncontrolled NIDDM-2 with hyperglycemia and hypoglycemia: A1c 12.9% on 12/12.  CBG within acceptable range. -Continue current insulin regimen -Continue statin (on hold with paxlovid)   Essential hypertension: Mild hypotension earlier this morning. -Decreased Coreg to 6.25 mg twice daily    Hx of CAD/HLD: Stable. -Continue Coreg, Lipitor and Plavix   AS s/p TAVR: Noted on stable.   Acute blood loss anemia in the setting  of surgery: s/p 3 units PRBC so far. Hb stable -Monitor  intermittently.   Depression,bipolar,schizophrenia,insomnia, chronic back pain: mood has been stable. -Continue Lexapro, Remeron and trazodone -Pain medications as above.    Dysphagia:Likely related to [place]. [place] has been removed. She does not want to have NG tube or tube feeding. Family encouraged for oral nutrition.  Dysphagia seems to have resolved. -Upgraded to regular diet   Increased nutrient needs/decreased oral intake: Body mass index is 23.83 kg/m. Nutrition Problem: Increased nutrient needs Etiology: wound healing Signs/Symptoms: estimated needs Interventions: Refer to RD note for recommendations   Pressure skin injury: POA Pressure Injury 08/04/22 Sacrum Stage 2 -  Partial thickness loss of dermis presenting as [name] shallow open injury with [name] red, pink wound bed without slough. (Active)  08/04/22 0100  Location: Sacrum  Location Orientation:   Staging: Stage 2 -  Partial thickness loss of dermis presenting as [name] shallow open injury with [name] red, pink wound bed without slough.  Wound Description (Comments):   Present on Admission: Yes     DVT prophylaxis: heparin Code Status: full Family Communication: none - called husband no answer 1/24 Disposition:   Status is: Inpatient Remains inpatient appropriate because: continued inpatient management   Consultants:  ID vascular  Procedures:  1/23 Bilateral groin wound washout Soft tissue debridement Myriad Morcell powder placement 2500g VAC dressing placement  Antimicrobials:  Anti-infectives (From admission, onward)    Start     Dose/Rate Route Frequency Ordered Stop   09/19/22 0100  nirmatrelvir/ritonavir (PAXLOVID) 3 tablet        3 tablet Oral 2 times daily 09/19/22 0003 09/23/22 2159   09/16/22 1600  ceFEPIme (MAXIPIME) 2 g in sodium chloride 0.9 % 100 mL IVPB        2 g 200 mL/hr over 30 Minutes Intravenous Every 8 hours 09/16/22 1232     09/09/22 1100  metroNIDAZOLE (FLAGYL) tablet 500 mg        500 mg  Oral Every 12 hours 09/09/22 1002     08/20/22 2200  metroNIDAZOLE (FLAGYL) IVPB 500 mg  Status:  Discontinued        500 mg 100 mL/hr over 60 Minutes Intravenous Every 12 hours 08/20/22 1110 08/20/22 1111   08/20/22 2200  metroNIDAZOLE (FLAGYL) tablet 500 mg  Status:  Discontinued        500 mg Per Tube Every 12 hours 08/20/22 1111 09/09/22 1002   08/18/22 2300  ceFAZolin (ANCEF) IVPB 2g/100 mL premix  Status:  Discontinued        2 g 200 mL/hr over 30 Minutes Intravenous Every 8 hours 08/18/22 1455 09/16/22 1232   08/18/22 2300  metroNIDAZOLE (FLAGYL) IVPB 500 mg  Status:  Discontinued        500 mg 100 mL/hr over 60 Minutes Intravenous Every 6 hours 08/18/22 1455 08/20/22 1110   08/15/22 1100  vancomycin (VANCOCIN) IVPB 1000 mg/200 mL premix  Status:  Discontinued        1,000 mg 200 mL/hr over 60 Minutes Intravenous Every 24 hours 08/14/22 1006 08/15/22 1307   08/14/22 1400  piperacillin-tazobactam (ZOSYN) IVPB 3.375 g  Status:  Discontinued        3.375 g 12.5 mL/hr over 240 Minutes Intravenous Every 8 hours 08/14/22 1006 08/18/22 1455   08/14/22 1100  vancomycin (VANCOREADY) IVPB 1500 mg/300 mL        1,500 mg 150 mL/hr over 120 Minutes Intravenous  Once 08/14/22 1006 08/14/22 1502   08/04/22 1045  ceFAZolin (ANCEF) IVPB 2g/100 mL premix        2 g 200 mL/hr over 30 Minutes Intravenous On call to O.R. 08/04/22 0958 08/04/22 1100       Subjective: Feeling better today  Objective: Vitals:   09/19/22 0510 09/19/22 0829 09/19/22 1122 09/19/22 1613  BP: (!) 194/69 137/71 (!) 177/47 (!) 92/50  Pulse: 68 72 67 83  Resp: '20 20 18 18  '$ Temp: 98.1 F (36.7 C) 99.1 F (37.3 C) 98.4 F (36.9 C) 99 F (37.2 C)  TempSrc: Oral Axillary Oral Oral  SpO2: 97% 95% 95% 93%  Weight:      Height:        Intake/Output Summary (Last 24 hours) at 09/19/2022 1628 Last data filed at 09/19/2022 0531 Gross per 24 hour  Intake --  Output 500 ml  Net -500 ml   Filed Weights   09/10/22  0403 09/16/22 0005 09/16/22 0838  Weight: 62.6 kg 59.4 kg 55.3 kg    Examination:  General: No acute distress. Cardiovascular: RRR Lungs: unlabored Abdomen: Soft, nontender, nondistended Neurological: Alert and oriented 3. Moves all extremities 4. Cranial nerves II through XII grossly intact. Extremities: L AKA    Data Reviewed: I have personally reviewed following labs and imaging studies  CBC: Recent Labs  Lab 09/17/22 0329 09/18/22 0748 09/19/22 0227  WBC 8.1 6.6 5.6  NEUTROABS  --   --  3.4  HGB 9.4* 9.1* 9.8*  HCT 30.8* 29.8* 31.6*  MCV 93.6 93.1 93.2  PLT 475* 525* 537*    Basic Metabolic Panel: Recent Labs  Lab 09/15/22 0102 09/17/22 0329 09/18/22 0748 09/19/22 0227  NA 134* 134* 136 137  K 3.9 4.9 4.4 3.7  CL 103 104 105 108  CO2 27 21* 22 23  GLUCOSE 160* 333* 172* 115*  BUN 7* '15 13 9  '$ CREATININE 0.67 0.86 0.74 0.60  CALCIUM 8.3* 8.1* 8.5* 8.7*  MG  --  1.8  --  1.7  PHOS  --  4.0  --  2.2*    GFR: Estimated Creatinine Clearance: 51.8 mL/min (by C-G formula based on SCr of 0.6 mg/dL).  Liver Function Tests: Recent Labs  Lab 09/17/22 0329 09/19/22 0227  AST  --  13*  ALT  --  6  ALKPHOS  --  67  BILITOT  --  0.5  PROT  --  5.6*  ALBUMIN 2.1* 2.1*    CBG: Recent Labs  Lab 09/18/22 1117 09/18/22 1734 09/19/22 0528 09/19/22 1121 09/19/22 1611  GLUCAP 220* 118* 123* 178* 258*     Recent Results (from the past 240 hour(s))  Surgical pcr screen     Status: None   Collection Time: 09/16/22  9:00 AM   Specimen: Nasal Mucosa; Nasal Swab  Result Value Ref Range Status   MRSA, PCR NEGATIVE NEGATIVE Final   Staphylococcus aureus NEGATIVE NEGATIVE Final    Comment: (NOTE) The Xpert SA Assay (FDA approved for NASAL specimens in patients 59 years of age and older), is one component of Raena Pau comprehensive surveillance program. It is not intended to diagnose infection nor to guide or monitor treatment. Performed at [facility], [place] [address] [place], [place] [address]   Resp panel by RT-PCR (RSV, Flu Jemeka Wagler&B, Covid) Anterior Nasal Swab     Status: Abnormal   Collection Time: 09/18/22 11:40 AM   Specimen: Anterior Nasal Swab  Result Value Ref Range Status   SARS Coronavirus 2 by RT PCR POSITIVE ([place])  NEGATIVE Final    Comment: (NOTE) SARS-CoV-2 target nucleic acids are DETECTED.  The SARS-CoV-2 RNA is generally detectable in upper respiratory specimens during the acute phase of infection. Positive results are indicative of the presence of the identified virus, but do not rule out bacterial infection or co-infection with other pathogens not detected by the test. Clinical correlation with patient history and other diagnostic information is necessary to determine patient infection status. The expected result is Negative.  Fact Sheet for Patients: EntrepreneurPulse.com.au  Fact Sheet for Healthcare Providers: IncredibleEmployment.be  This test is not yet approved or cleared by the [place] FDA and  has been authorized for detection and/or diagnosis of SARS-CoV-2 by FDA under an Emergency Use Authorization (EUA).  This EUA will remain in effect (meaning this test can be used) for the duration of  the COVID-19 declaration under Section 564(b)(1) of the [place], 21 U.S.C. section 360bbb-3(b)(1), unless the authorization is terminated or revoked sooner.     Influenza Robena Ewy by PCR NEGATIVE NEGATIVE Final   Influenza B by PCR NEGATIVE NEGATIVE Final    Comment: (NOTE) The Xpert Xpress SARS-CoV-2/FLU/RSV plus assay is intended as an aid in the diagnosis of influenza from Nasopharyngeal swab specimens and should not be used as [place] Nigh sole basis for treatment. Nasal washings and aspirates are unacceptable for Xpert Xpress SARS-CoV-2/FLU/RSV testing.  Fact Sheet for Patients: EntrepreneurPulse.com.au  Fact Sheet for Healthcare  Providers: IncredibleEmployment.be  This test is not yet approved or cleared by the [place] FDA and has been authorized for detection and/or diagnosis of SARS-CoV-2 by FDA under an Emergency Use Authorization (EUA). This EUA will remain in effect (meaning this test can be used) for the duration of the COVID-19 declaration under Section 564(b)(1) of the Act, 21 U.S.C. section 360bbb-3(b)(1), unless the authorization is terminated or revoked.     Resp Syncytial Virus by PCR NEGATIVE NEGATIVE Final    Comment: (NOTE) Fact Sheet for Patients: EntrepreneurPulse.com.au  Fact Sheet for Healthcare Providers: IncredibleEmployment.be  This test is not yet approved or cleared by the [place] FDA and has been authorized for detection and/or diagnosis of SARS-CoV-2 by FDA under an Emergency Use Authorization (EUA). This EUA will remain in effect (meaning this test can be used) for the duration of the COVID-19 declaration under Section 564(b)(1) of the Act, 21 U.S.C. section 360bbb-3(b)(1), unless the authorization is terminated or revoked.  Performed at [facility], [place] [address] [place], [place] [address]          Radiology Studies: [place] EKG SITE RITE  Result Date: 09/19/2022 If Site Rite image not attached, placement could not be confirmed due to current cardiac rhythm.  DG CHEST PORT 1 VIEW  Result Date: 09/18/2022 CLINICAL DATA:  COVID EXAM: PORTABLE CHEST 1 VIEW COMPARISON:  None Available. FINDINGS: The heart size and mediastinal contours are within normal limits. Patient is status post TAVR, unchanged. Both lungs are clear. The visualized skeletal structures are unremarkable. IMPRESSION: No active disease. Electronically Signed   By: Ronney Asters M.D.   On: 09/18/2022 15:34        Scheduled Meds:  arformoterol  15 mcg Nebulization BID   budesonide (PULMICORT) nebulizer solution  0.5 mg Nebulization  BID   carvedilol  6.25 mg Oral BID WC   Chlorhexidine Gluconate Cloth  6 each Topical Q0600   clopidogrel  75 mg Oral Daily   dronabinol  2.5 mg Oral BID AC   escitalopram  10 mg Oral Daily  Gerhardt's butt cream   Topical BID   heparin injection (subcutaneous)  5,000 Units Subcutaneous Q8H   insulin aspart  0-6 Units Subcutaneous TID WC   insulin aspart  2 Units Subcutaneous TID WC   insulin glargine-yfgn  5 Units Subcutaneous BID   metroNIDAZOLE  500 mg Oral Q12H   mirtazapine  7.5 mg Oral QHS   multivitamin with minerals  1 tablet Oral Daily   nicotine  14 mg Transdermal Daily   nirmatrelvir/ritonavir  3 tablet Oral BID   oxyCODONE  10 mg Oral Q12H   pantoprazole  40 mg Oral Daily   revefenacin  175 mcg Nebulization Daily   senna-docusate  1 tablet Oral QHS   sodium chloride flush  10-40 mL Intracatheter Q12H   sodium chloride flush  10-40 mL Intracatheter Q12H   sodium hypochlorite   Irrigation Daily   Continuous Infusions:  ceFEPime (MAXIPIME) IV 2 g (09/19/22 0823)     LOS: 46 days    Time spent: over 30 min    Fayrene Helper, MD [facility]   To contact the attending provider between 7A-7P or the covering provider during after hours 7P-7A, please log into the web site [email or web address] and access using universal Welcome password for that web site. If you do not have the password, please call the hospital operator.  09/19/2022, 4:28 PM

## 2022-09-21 DIAGNOSIS — I739 Peripheral vascular disease, unspecified: Secondary | ICD-10-CM | POA: Diagnosis not present

## 2022-09-21 LAB — GLUCOSE, CAPILLARY
Glucose-Capillary: 151 mg/dL — ABNORMAL HIGH (ref 70–99)
Glucose-Capillary: 214 mg/dL — ABNORMAL HIGH (ref 70–99)
Glucose-Capillary: 279 mg/dL — ABNORMAL HIGH (ref 70–99)
Glucose-Capillary: 190 mg/dL — ABNORMAL HIGH (ref 70–99)

## 2022-09-21 MED ORDER — INSULIN ASPART 100 UNIT/ML IJ SOLN: 4.0000 [IU] | Freq: Three times a day (TID) | INTRAMUSCULAR | Status: DC

## 2022-09-21 MED ORDER — ATORVASTATIN CALCIUM 80 MG PO TABS
80.0000 mg | ORAL_TABLET | Freq: Every day | ORAL | Status: DC
  Administered 2022-09-22: 80 mg via ORAL
  Filled 2022-09-21: qty 1

## 2022-09-21 NOTE — Progress Notes (Signed)
CCMD notified  patient with the brief episode of HR up to 142,patient was asymptomatic,will continue to monitor

## 2022-09-21 NOTE — Progress Notes (Signed)
PROGRESS NOTE    Heather [name]  QVZ:[id number] DOB: [birth year] DOA: 08/04/2022 PCP: [name], FNP  No chief complaint on file.   Brief Narrative:  72 year old F with PMH of DM-2, diastolic CHF, COPD, HTN, HLD, GERD, PAD, AS s/p TAVR, tobacco use disorder, anxiety, depression and schizophrenia presented to ED on 12/11 with SBP in 200s, dizziness, headache and blurry vision, and admitted to ICU for hemorrhagic cerebral stroke and BLE ischemia.  She underwent BLE femoral endarterectomy and patch angioplasty with iliac stenting and 4 compartment fasciotomies.  Postop, she remained on vent and Cleviprex.  Eventually, she was extubated and transferred to hospitalist service on 12/17.  She also underwent left AKA and excisional debridement of bilateral groin wounds with VAC placement on 12/21.  Hospital course and then noteworthy for episode of hypoglycemia with insulin adjustments.  Initially required tube feed that was later discontinued.  Remains physically deconditioned.  Wound VAC discontinued on 1/18.  She is now WTD dressing with silver to bilateral groins. Concern about persistent or worsening greenish drainage with tunneling.  Underwent groin debridement and wound VAC placement on 1/23.  Antibiotics broadened to IV cefepime and Flagyl per ID and VVS.   Assessment & Plan:   Principal Problem:   PVD (peripheral vascular disease) (HCC) Active Problems:   Hypertension, benign   S/P TAVR (transcatheter aortic valve replacement)   Tobacco use disorder   PAD (peripheral artery disease) (HCC)   ICH (intracerebral hemorrhage) (HCC)   Abscess of anal and rectal regions   Decubitus ulcer of sacral region, stage 2 ([place])   Pulmonary edema   Right groin wound   Wound of left groin   Decreased oral intake   Chronic diastolic CHF (congestive heart failure) (HCC)   Dysphagia   Surgical site infection  Malaise  Myalgias  COVID 19 viral infection Remains afebrile, normal leukocytosis Paxlovid  after discussion of risks and benefits - statin currently on hold Sounds like she may decline further paxlovid after vomiting, will clarify  Peripheral arterial disease/bilateral lower extremity ischemia -Bilateral CFA endarterectomy with bovine patch angioplasty -Bilateral iliofemoral embolectomy, stenting on 12/11 -Bilateral CIA, stent Rt EIA and 4 compartment fasciotomies on 12/11 -Left AKA and excisional debridement of bilateral groin wounds with VAC placement 08/15/23 -1/21-concern about persistent or worsening greenish drainage with tunneling of left groin wound -1/23 s/p bilateral groin wound washout, soft tissue debridement, myriad morcell powder placement, vac dressing placement - vascular to reevaluate wound 1/29 -cefepime/flagyl x 6 weeks per ID, appreciate recs -Continue Plavix and statin. -Patient has left arm midline in place.  -Tylenol, oxycodone, Robaxin and Dilaudid for pain control   Acute intracranial hemorrhage: MRI showed acute intraparenchymal hematoma in cerebellar vermis measuring 11 cc in volume and mild mass effect upon the fourth ventricle. Seen by neurology.  Neurologically stable   Acute respiratory failure with hypoxia COPD/tobacco use disorder: -Extubated 12/14> On room air currently. -Continue current Brovana Pulmicort,Yupleri and albuterol as needed.   Chronic diastolic CHF/HTN: TTE with LVEF of 60 to 65%, indeterminate DD.  Appears euvolemic on exam. -Continue Coreg.   Uncontrolled NIDDM-2 with hyperglycemia and hypoglycemia: A1c 12.9% on 12/12.  CBG within acceptable range. -Continue current insulin regimen -Continue statin (on hold with paxlovid, plan to resume 1/29)   Essential hypertension: Mild hypotension earlier this morning. -Decreased Coreg to 6.25 mg twice daily    Hx of CAD/HLD: Stable. -Continue Coreg, Lipitor and Plavix   AS s/p TAVR: Noted on stable.   Acute blood loss  anemia in the setting of surgery: s/p 3 units PRBC so far. Hb  stable -Monitor intermittently.   Depression,bipolar,schizophrenia,insomnia, chronic back pain: mood has been stable. -Continue Lexapro, Remeron and trazodone -Pain medications as above.    Dysphagia:Likely related to [place]. [place]rack has been removed. She does not want to have NG tube or tube feeding. Family encouraged for oral nutrition.  Dysphagia seems to have resolved. -Upgraded to regular diet   Increased nutrient needs/decreased oral intake: Body mass index is 23.83 kg/m. Nutrition Problem: Increased nutrient needs Etiology: wound healing Signs/Symptoms: estimated needs Interventions: Refer to RD note for recommendations   Pressure skin injury: POA Pressure Injury 08/04/22 Sacrum Stage 2 -  Partial thickness loss of dermis presenting as Tanza Pellot shallow open injury with Mone Commisso red, pink wound bed without slough. (Active)  08/04/22 0100  Location: Sacrum  Location Orientation:   Staging: Stage 2 -  Partial thickness loss of dermis presenting as Rori Goar shallow open injury with Kimmarie Pascale red, pink wound bed without slough.  Wound Description (Comments):   Present on Admission: Yes     DVT prophylaxis: heparin Code Status: full Family Communication: none - called husband no answer 1/24 Disposition:   Status is: Inpatient Remains inpatient appropriate because: continued inpatient management   Consultants:  ID vascular  Procedures:  1/23 Bilateral groin wound washout Soft tissue debridement Myriad Morcell powder placement 2500g VAC dressing placement  Antimicrobials:  Anti-infectives (From admission, onward)    Start     Dose/Rate Route Frequency Ordered Stop   09/19/22 0100  nirmatrelvir/ritonavir (PAXLOVID) 3 tablet  Status:  Discontinued        3 tablet Oral 2 times daily 09/19/22 0003 09/20/22 1418   09/16/22 1600  ceFEPIme (MAXIPIME) 2 g in sodium chloride 0.9 % 100 mL IVPB        2 g 200 mL/hr over 30 Minutes Intravenous Every 8 hours 09/16/22 1232     09/09/22 1100  metroNIDAZOLE  (FLAGYL) tablet 500 mg        500 mg Oral Every 12 hours 09/09/22 1002     08/20/22 2200  metroNIDAZOLE (FLAGYL) IVPB 500 mg  Status:  Discontinued        500 mg 100 mL/hr over 60 Minutes Intravenous Every 12 hours 08/20/22 1110 08/20/22 1111   08/20/22 2200  metroNIDAZOLE (FLAGYL) tablet 500 mg  Status:  Discontinued        500 mg Per Tube Every 12 hours 08/20/22 1111 09/09/22 1002   08/18/22 2300  ceFAZolin (ANCEF) IVPB 2g/100 mL premix  Status:  Discontinued        2 g 200 mL/hr over 30 Minutes Intravenous Every 8 hours 08/18/22 1455 09/16/22 1232   08/18/22 2300  metroNIDAZOLE (FLAGYL) IVPB 500 mg  Status:  Discontinued        500 mg 100 mL/hr over 60 Minutes Intravenous Every 6 hours 08/18/22 1455 08/20/22 1110   08/15/22 1100  vancomycin (VANCOCIN) IVPB 1000 mg/200 mL premix  Status:  Discontinued        1,000 mg 200 mL/hr over 60 Minutes Intravenous Every 24 hours 08/14/22 1006 08/15/22 1307   08/14/22 1400  piperacillin-tazobactam (ZOSYN) IVPB 3.375 g  Status:  Discontinued        3.375 g 12.5 mL/hr over 240 Minutes Intravenous Every 8 hours 08/14/22 1006 08/18/22 1455   08/14/22 1100  vancomycin (VANCOREADY) IVPB 1500 mg/300 mL        1,500 mg 150 mL/hr over 120 Minutes Intravenous  Once  08/14/22 1006 08/14/22 1502   08/04/22 1045  ceFAZolin (ANCEF) IVPB 2g/100 mL premix        2 g 200 mL/hr over 30 Minutes Intravenous On call to O.R. 08/04/22 0958 08/04/22 1100       Subjective: No new complaints  Objective: Vitals:   09/21/22 0928 09/21/22 0929 09/21/22 1100 09/21/22 1640  BP:   (!) 159/74 (!) (P) 169/68  Pulse:   82 (P) 88  Resp:   20 (P) 20  Temp:   98.8 F (37.1 C) (P) 98.1 F (36.7 C)  TempSrc:   Oral (P) Oral  SpO2: 95% 95% 93%   Weight:      Height:        Intake/Output Summary (Last 24 hours) at 09/21/2022 1711 Last data filed at 09/21/2022 0659 Gross per 24 hour  Intake 1421.78 ml  Output 1100 ml  Net 321.78 ml   Filed Weights   09/16/22 0005  09/16/22 0838 09/21/22 0500  Weight: 59.4 kg 55.3 kg 55.4 kg    Examination:  General: No acute distress. Cardiovascular: RRR Lungs: unlabored Abdomen: Soft, nontender, nondistended Neurological: Alert and oriented 3. Moves all extremities 4. Cranial nerves II through XII grossly intact. Extremities: L AKA   Data Reviewed: I have personally reviewed following labs and imaging studies  CBC: Recent Labs  Lab 09/17/22 0329 09/18/22 0748 09/19/22 0227 09/20/22 0502  WBC 8.1 6.6 5.6 6.2  NEUTROABS  --   --  3.4 3.8  HGB 9.4* 9.1* 9.8* 9.2*  HCT 30.8* 29.8* 31.6* 29.3*  MCV 93.6 93.1 93.2 91.8  PLT 475* 525* 537* 542*    Basic Metabolic Panel: Recent Labs  Lab 09/15/22 0102 09/17/22 0329 09/18/22 0748 09/19/22 0227 09/20/22 0502  NA 134* 134* 136 137 137  K 3.9 4.9 4.4 3.7 3.9  CL 103 104 105 108 105  CO2 27 21* '22 23 26  '$ GLUCOSE 160* 333* 172* 115* 151*  BUN 7* '15 13 9 11  '$ CREATININE 0.67 0.86 0.74 0.60 0.77  CALCIUM 8.3* 8.1* 8.5* 8.7* 8.4*  MG  --  1.8  --  1.7 1.7  PHOS  --  4.0  --  2.2* 2.7    GFR: Estimated Creatinine Clearance: 51.9 mL/min (by C-G formula based on SCr of 0.77 mg/dL).  Liver Function Tests: Recent Labs  Lab 09/17/22 0329 09/19/22 0227 09/20/22 0502  AST  --  13* 13*  ALT  --  6 <5  ALKPHOS  --  67 70  BILITOT  --  0.5 0.4  PROT  --  5.6* 5.3*  ALBUMIN 2.1* 2.1* 2.1*    CBG: Recent Labs  Lab 09/20/22 1647 09/20/22 2105 09/21/22 0557 09/21/22 1159 09/21/22 1647  GLUCAP 275* 242* 151* 214* 279*     Recent Results (from the past 240 hour(s))  Surgical pcr screen     Status: None   Collection Time: 09/16/22  9:00 AM   Specimen: Nasal Mucosa; Nasal Swab  Result Value Ref Range Status   MRSA, PCR NEGATIVE NEGATIVE Final   Staphylococcus aureus NEGATIVE NEGATIVE Final    Comment: (NOTE) The Xpert SA Assay (FDA approved for NASAL specimens in patients 27 years of age and older), is one component of B[place]ana [place]  comprehensive surveillance program. It is not intended to diagnose infection nor to guide or monitor treatment. Performed at [facility], [place] [address] [place], [place] [address]   Resp panel by RT-PCR (RSV, Flu Khelani Kops&B, Covid) Anterior Nasal Swab  Status: Abnormal   Collection Time: 09/18/22 11:40 AM   Specimen: Anterior Nasal Swab  Result Value Ref Range Status   SARS Coronavirus 2 by RT PCR POSITIVE (Demya Scruggs) NEGATIVE Final    Comment: (NOTE) SARS-CoV-2 target nucleic acids are DETECTED.  The SARS-CoV-2 RNA is generally detectable in upper respiratory specimens during the acute phase of infection. Positive results are indicative of the presence of the identified virus, but do not rule out bacterial infection or co-infection with other pathogens not detected by the test. Clinical correlation with patient history and other diagnostic information is necessary to determine patient infection status. The expected result is Negative.  Fact Sheet for Patients: EntrepreneurPulse.com.au  Fact Sheet for Healthcare Providers: IncredibleEmployment.be  This test is not yet approved or cleared by the [place] FDA and  has been authorized for detection and/or diagnosis of SARS-CoV-2 by FDA under an Emergency Use Authorization (EUA).  This EUA will remain in effect (meaning this test can be used) for the duration of  the COVID-19 declaration under Section 564(b)(1) of the [place], 21 U.S.C. section 360bbb-3(b)(1), unless the authorization is terminated or revoked sooner.     Influenza Naziah Portee by PCR NEGATIVE NEGATIVE Final   Influenza B by PCR NEGATIVE NEGATIVE Final    Comment: (NOTE) The Xpert Xpress SARS-CoV-2/FLU/RSV plus assay is intended as an aid in the diagnosis of influenza from Nasopharyngeal swab specimens and should not be used as Chariti Havel sole basis for treatment. Nasal washings and aspirates are unacceptable for Xpert Xpress  SARS-CoV-2/FLU/RSV testing.  Fact Sheet for Patients: EntrepreneurPulse.com.au  Fact Sheet for Healthcare Providers: IncredibleEmployment.be  This test is not yet approved or cleared by the [place] FDA and has been authorized for detection and/or diagnosis of SARS-CoV-2 by FDA under an Emergency Use Authorization (EUA). This EUA will remain in effect (meaning this test can be used) for the duration of the COVID-19 declaration under Section 564(b)(1) of the Act, 21 U.S.C. section 360bbb-3(b)(1), unless the authorization is terminated or revoked.     Resp Syncytial Virus by PCR NEGATIVE NEGATIVE Final    Comment: (NOTE) Fact Sheet for Patients: EntrepreneurPulse.com.au  Fact Sheet for Healthcare Providers: IncredibleEmployment.be  This test is not yet approved or cleared by the [place] FDA and has been authorized for detection and/or diagnosis of SARS-CoV-2 by FDA under an Emergency Use Authorization (EUA). This EUA will remain in effect (meaning this test can be used) for the duration of the COVID-19 declaration under Section 564(b)(1) of the Act, 21 U.S.C. section 360bbb-3(b)(1), unless the authorization is terminated or revoked.  Performed at [facility], [place] [address] [place], [place] [address]          Radiology Studies: No results found.      Scheduled Meds:  arformoterol  15 mcg Nebulization BID   budesonide (PULMICORT) nebulizer solution  0.5 mg Nebulization BID   carvedilol  6.25 mg Oral BID WC   Chlorhexidine Gluconate Cloth  6 each Topical Q0600   clopidogrel  75 mg Oral Daily   dronabinol  2.5 mg Oral BID AC   escitalopram  10 mg Oral Daily   Gerhardt's butt cream   Topical BID   heparin injection (subcutaneous)  5,000 Units Subcutaneous Q8H   insulin aspart  0-6 Units Subcutaneous TID WC   insulin aspart  2 Units Subcutaneous TID WC   insulin  glargine-yfgn  5 Units Subcutaneous BID   metroNIDAZOLE  500 mg Oral Q12H   mirtazapine  7.5 mg Oral QHS   multivitamin with minerals  1 tablet Oral Daily   nicotine  14 mg Transdermal Daily   oxyCODONE  10 mg Oral Q12H   pantoprazole  40 mg Oral Daily   revefenacin  175 mcg Nebulization Daily   senna-docusate  1 tablet Oral QHS   sodium chloride flush  10-40 mL Intracatheter Q12H   sodium chloride flush  10-40 mL Intracatheter Q12H   sodium chloride flush  10-40 mL Intracatheter Q12H   sodium hypochlorite   Irrigation Daily   Continuous Infusions:  ceFEPime (MAXIPIME) IV 2 g (09/21/22 0902)     LOS: 48 days    Time spent: over 30 min    Fayrene Helper, MD [facility]   To contact the attending provider between 7A-7P or the covering provider during after hours 7P-7A, please log into the web site [email or web address] and access using universal [place] password for that web site. If you do not have the password, please call the hospital operator.  09/21/2022, 5:11 PM

## 2022-09-22 ENCOUNTER — Other Ambulatory Visit: Payer: Self-pay | Admitting: Cardiology

## 2022-09-22 ENCOUNTER — Other Ambulatory Visit: Payer: Self-pay | Admitting: Nurse Practitioner

## 2022-09-22 DIAGNOSIS — I471 Supraventricular tachycardia, unspecified: Secondary | ICD-10-CM

## 2022-09-22 LAB — COMPREHENSIVE METABOLIC PANEL
ALT: <5 U/L (ref 0–44)
AST: 13 U/L — ABNORMAL LOW (ref 15–41)
Albumin: 2.1 g/dL — ABNORMAL LOW (ref 3.5–5.0)
Alkaline Phosphatase: 64 U/L (ref 38–126)
Anion gap: 7 (ref 5–15)
BUN: 11 mg/dL (ref 8–23)
CO2: 25 mmol/L (ref 22–32)
Calcium: 8.8 mg/dL — ABNORMAL LOW (ref 8.9–10.3)
Chloride: 103 mmol/L (ref 98–111)
Creatinine, Ser: 0.73 mg/dL (ref 0.44–1.00)
GFR, Estimated: >60 mL/min (ref >60)
Glucose, Bld: 160 mg/dL — ABNORMAL HIGH (ref 70–99)
Potassium: 3.9 mmol/L (ref 3.5–5.1)
Sodium: 135 mmol/L (ref 135–145)
Total Bilirubin: 0.5 mg/dL (ref 0.3–1.2)
Total Protein: 5.6 g/dL — ABNORMAL LOW (ref 6.5–8.1)

## 2022-09-22 LAB — CBC WITH DIFFERENTIAL/PLATELET
Abs Immature Granulocytes: 0.11 10*3/uL — ABNORMAL HIGH (ref 0.00–0.07)
Basophils Absolute: 0.1 10*3/uL (ref 0.0–0.1)
Basophils Relative: 1 %
Eosinophils Absolute: 0.3 10*3/uL (ref 0.0–0.5)
Eosinophils Relative: 3 %
HCT: 27.8 % — ABNORMAL LOW (ref 36.0–46.0)
Hemoglobin: 9.1 g/dL — ABNORMAL LOW (ref 12.0–15.0)
Immature Granulocytes: 1 %
Lymphocytes Relative: 14 %
Lymphs Abs: 1.2 10*3/uL (ref 0.7–4.0)
MCH: 30.2 pg (ref 26.0–34.0)
MCHC: 32.7 g/dL (ref 30.0–36.0)
MCV: 92.4 fL (ref 80.0–100.0)
Monocytes Absolute: 1 10*3/uL (ref 0.1–1.0)
Monocytes Relative: 12 %
Neutro Abs: 5.9 10*3/uL (ref 1.7–7.7)
Neutrophils Relative %: 69 %
Platelets: 550 10*3/uL — ABNORMAL HIGH (ref 150–400)
RBC: 3.01 MIL/uL — ABNORMAL LOW (ref 3.87–5.11)
RDW: 15.9 % — ABNORMAL HIGH (ref 11.5–15.5)
WBC: 8.6 10*3/uL (ref 4.0–10.5)
nRBC: 0 % (ref 0.0–0.2)

## 2022-09-22 LAB — PHOSPHORUS: Phosphorus: 2.8 mg/dL (ref 2.5–4.6)

## 2022-09-22 LAB — GLUCOSE, CAPILLARY
Glucose-Capillary: 152 mg/dL — ABNORMAL HIGH (ref 70–99)
Glucose-Capillary: 214 mg/dL — ABNORMAL HIGH (ref 70–99)
Glucose-Capillary: 270 mg/dL — ABNORMAL HIGH (ref 70–99)

## 2022-09-22 LAB — MAGNESIUM: Magnesium: 1.8 mg/dL (ref 1.7–2.4)

## 2022-09-22 MED ORDER — INSULIN GLARGINE-YFGN 100 UNIT/ML ~~LOC~~ SOLN: 5.0000 [IU] | Freq: Two times a day (BID) | SUBCUTANEOUS | 11 refills | Status: DC

## 2022-09-22 MED ORDER — CARVEDILOL 6.25 MG PO TABS: 6.2500 mg | ORAL_TABLET | Freq: Two times a day (BID) | ORAL | 0 refills | Status: AC

## 2022-09-22 MED ORDER — INSULIN ASPART 100 UNIT/ML IJ SOLN: 3.0000 [IU] | Freq: Three times a day (TID) | INTRAMUSCULAR | 11 refills | Status: DC

## 2022-09-22 MED ORDER — POLYETHYLENE GLYCOL 3350 17 G PO PACK
17.0000 g | PACK | Freq: Two times a day (BID) | ORAL | Status: DC
  Administered 2022-09-22: 17 g via ORAL
  Filled 2022-09-22: qty 1

## 2022-09-22 MED ORDER — ESCITALOPRAM OXALATE 10 MG PO TABS: 10.0000 mg | ORAL_TABLET | Freq: Every day | ORAL | 0 refills | Status: AC

## 2022-09-22 MED ORDER — PANTOPRAZOLE SODIUM 40 MG PO TBEC: 40.0000 mg | DELAYED_RELEASE_TABLET | Freq: Every day | ORAL | 0 refills | Status: AC

## 2022-09-22 MED ORDER — MIRTAZAPINE 7.5 MG PO TABS: 7.5000 mg | ORAL_TABLET | Freq: Every day | ORAL | 0 refills | Status: AC

## 2022-09-22 MED ORDER — HEPARIN SOD (PORK) LOCK FLUSH 100 UNIT/ML IV SOLN
250.0000 [IU] | INTRAVENOUS | Status: AC | PRN
  Administered 2022-09-22: 250 [IU]

## 2022-09-22 MED ORDER — CEFEPIME IV (FOR PTA / DISCHARGE USE ONLY): 2.0000 g | Freq: Three times a day (TID) | INTRAVENOUS | 0 refills | Status: DC

## 2022-09-22 MED ORDER — OXYCODONE HCL 5 MG PO TABS: 5.0000 mg | ORAL_TABLET | Freq: Four times a day (QID) | ORAL | 0 refills | Status: AC | PRN

## 2022-09-22 MED ORDER — POLYETHYLENE GLYCOL 3350 17 G PO PACK: 17.0000 g | PACK | Freq: Every day | ORAL | 0 refills | Status: DC

## 2022-09-22 MED ORDER — TRAZODONE HCL 50 MG PO TABS: 25.0000 mg | ORAL_TABLET | Freq: Every evening | ORAL | Status: AC | PRN

## 2022-09-22 MED ORDER — DRONABINOL 2.5 MG PO CAPS: 2.5000 mg | ORAL_CAPSULE | Freq: Two times a day (BID) | ORAL | 0 refills | Status: AC

## 2022-09-22 MED ORDER — METRONIDAZOLE 500 MG PO TABS: 500.0000 mg | ORAL_TABLET | Freq: Two times a day (BID) | ORAL | 0 refills | Status: DC

## 2022-09-22 NOTE — Progress Notes (Signed)
Physical Therapy Treatment Patient Details Name: Heather [name] MRN: [id number] DOB: 1951/08/06 Today's Date: 09/22/2022   History of Present Illness Pt is a 72 y.o. female admitted 08/04/22 with headache, dizziness. 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  Found to have mottled LE's after given Narcdipine due to HTN; s/p emergent common femoral and iliofemoral endarterectomy and bilateral 4 compartment fasciotomies on 12/11. 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 S/p groin washout/wound vac placement 1/23. PMH includes smoker(2ppd), severe AI/AS s/p TAVR, CAD s/p PCI and on DAPT, COPD, PAD, HTN.    PT Comments    Pt greeted supine and agreeable to session, however pt limited by pain. Pt able to complete bed mobility with increased time with up to mod a to elevate trunk. Pt with tendency for posterior lean secondary to pain needing constant hands on assist to maintain sitting balance. Bed elevated and chair placed in front of pt as pt RLE unable to reach floor with bed in lowest position secondary to pt stature. Further mobility deferred as pt requesting to lie back down secondary to pain. Current plan remains appropriate to address deficits and maximize functional independence and decrease caregiver burden. Pt continues to benefit from skilled PT services to progress toward functional mobility goals.    Recommendations for follow up therapy are one component of a multi-disciplinary discharge planning process, led by the attending physician.  Recommendations may be updated based on patient status, additional functional criteria and insurance authorization.  Follow Up Recommendations  Skilled nursing-short term rehab (<3 hours/day) Can patient physically be transported by private vehicle: No   Assistance Recommended at Discharge Frequent or constant Supervision/Assistance  Patient can return home with the following Two people to help with walking  and/or transfers;Two people to help with bathing/dressing/bathroom;Help with stairs or ramp for entrance;Assist for transportation;Assistance with cooking/housework   Equipment Recommendations  Other (comment) (TBD)    Recommendations for Other Services       Precautions / Restrictions Precautions Precautions: Fall;Other (comment) Precaution Comments: multiple buttocks wounds, B groin wound vac Restrictions Weight Bearing Restrictions: Yes RLE Weight Bearing: Weight bearing as tolerated LLE Weight Bearing: Non weight bearing     Mobility  Bed Mobility Overal bed mobility: Needs Assistance Bed Mobility: Rolling, Sidelying to Sit, Sit to Sidelying Rolling: Mod assist, Min assist Sidelying to sit: Mod assist Supine to sit: Mod assist Sit to supine: Max assist   General bed mobility comments: min assist to roll R/L Assist to elevate trunk into sitting and bring hips to EOB. Assist to lower trunk and bring RLE up into bed. increased assit to rise form lying flat    Transfers                        Ambulation/Gait                   Stairs             Wheelchair Mobility    Modified Rankin (Stroke Patients Only) Modified Rankin (Stroke Patients Only) Pre-Morbid Rankin Score: No significant disability Modified Rankin: Severe disability     Balance Overall balance assessment: Needs assistance Sitting-balance support: No upper extremity supported, Single extremity supported, Bilateral upper extremity supported Sitting balance-Leahy Scale: Poor Sitting balance - Comments: Needed UE support intermittently while sitting EOB, elevated bed and placed chair under pt R foot, requiring constant hands on to maintain balance as  pt with posterior lean Postural control: Posterior lean                                  Cognition Arousal/Alertness: Awake/alert Behavior During Therapy: Flat affect Overall Cognitive Status: Impaired/Different from  baseline Area of Impairment: Following commands, Safety/judgement, Attention, Memory, Awareness, Problem solving                 Orientation Level: Time Current Attention Level: Selective Memory: Decreased short-term memory Following Commands: Follows one step commands consistently, Follows one step commands with increased time Safety/Judgement: Decreased awareness of safety, Decreased awareness of deficits Awareness: Emergent Problem Solving: Slow processing, Decreased initiation, Requires verbal cues, Difficulty sequencing General Comments: pt alert and in good spirits today.  Increased time to follow simple commands.        Exercises General Exercises - Lower Extremity Quad Sets: Left, 10 reps, Supine Amputee Exercises Hip ABduction/ADduction: AROM, Left, 10 reps, Supine Hip Flexion/Marching: AROM, Left, Seated Straight Leg Raises: Left, 10 reps, Supine, AROM    General Comments General comments (skin integrity, edema, etc.): VSS on RA      Pertinent Vitals/Pain Pain Assessment Pain Assessment: Faces Faces Pain Scale: Hurts even more Pain Location: back- chronic Pain Descriptors / Indicators: Grimacing, Guarding, Discomfort Pain Intervention(s): Monitored during session, Limited activity within patient's tolerance    Home Living                          Prior Function            PT Goals (current goals can now be found in the care plan section) Acute Rehab PT Goals Patient Stated Goal: not stated PT Goal Formulation: With patient Time For Goal Achievement: 09/16/22    Frequency    Min 2X/week      PT Plan Current plan remains appropriate    Co-evaluation              AM-PAC PT "6 Clicks" Mobility   Outcome Measure  Help needed turning from your back to your side while in a flat bed without using bedrails?: A Lot Help needed moving from lying on your back to sitting on the side of a flat bed without using bedrails?: A Lot Help  needed moving to and from a bed to a chair (including a wheelchair)?: Total Help needed standing up from a chair using your arms (e.g., wheelchair or bedside chair)?: Total Help needed to walk in hospital room?: Total Help needed climbing 3-5 steps with a railing? : Total 6 Click Score: 8    End of Session   Activity Tolerance: Patient limited by fatigue;Patient limited by pain Patient left: in bed;with call bell/phone within reach;with bed alarm set Nurse Communication: Mobility status;Patient requests pain meds PT Visit Diagnosis: Muscle weakness (generalized) (M62.81);Other abnormalities of gait and mobility (R26.89) Pain - Right/Left: Left Pain - part of body: Ankle and joints of foot     Time: 1220-1246 PT Time Calculation (min) (ACUTE ONLY): 26 min  Charges:  $Therapeutic Activity: 23-37 mins                     Elijah Phommachanh R. PTA Acute Rehabilitation Services Office: [place] 09/22/2022, 1:01 PM

## 2022-09-22 NOTE — Care Management Important Message (Signed)
Important Message  Patient Details  Name: Heather [name] MRN: [id number] Date of Birth: 1951-01-18   Medicare Important Message Given:  Yes     Shelda Altes 09/22/2022, 12:54 PM

## 2022-09-22 NOTE — TOC Transition Note (Addendum)
Transition of Care [facility]) - CM/SW Discharge Note   Patient Details  Name: Heather [name] MRN: [id number] Date of Birth: 04-28-1951  Transition of Care [facility]) CM/SW Contact:  Vinie Sill, LCSW Phone Number: 09/22/2022, 11:43 AM   Clinical Narrative:     Patient will Discharge to: [facility]  Discharge Date: 09/22/2022 Family Notified: [name]-daughter  Transport By: Corey Harold  Per MD patient is ready for discharge. RN, patient, and facility notified of discharge. Discharge Summary sent to facility. RN given number for [phone]. Ambulance transport requested for patient.   Clinical Social Worker signing off.  Thurmond Butts, MSW, LCSW Clinical Social Worker     Final next level of care: Skilled Nursing Facility Barriers to Discharge: Barriers Resolved   Patient Goals and CMS Choice CMS Medicare.gov Compare Post Acute Care list provided to:: Patient Choice offered to / list presented to : Spouse  Discharge Placement                Patient [facility] bed at:  [facility]) Patient to be transferred to facility by: [facility] Name of family member notified: daughter Patient and family notified of of transfer: 09/22/22  Discharge Plan and Services Additional resources added to the After Visit Summary for   In-house Referral: Clinical Social Work   Post Acute Care Choice: Long Term Acute Care (LTAC)                               Social Determinants of Health ([place]) Interventions [place]: Food Insecurity Present (08/11/2022)  Housing: Low Risk  (08/11/2022)  Transportation Needs: No Transportation Needs (08/11/2022)  Utilities: Not At Risk (08/11/2022)  Financial Resource Strain: Medium Risk (10/19/2018)  Physical Activity: Inactive (10/19/2018)  Social Connections: Unknown (10/19/2018)  Tobacco Use: High Risk (09/17/2022)     Readmission Risk Interventions    11/13/2020    2:27 PM  Readmission Risk Prevention Plan   Transportation Screening Complete  PCP or Specialist Appt within 3-5 Days Complete  HRI or [place] Complete  Social Work Consult for [place] Planning/Counseling Complete  Palliative Care Screening Not Applicable  Medication Review Press photographer) Complete

## 2022-09-22 NOTE — Progress Notes (Addendum)
  Progress Note    09/22/2022 8:19 AM 6 Days Post-Op  Subjective:  says she is very cold. No other complaints   Vitals:   09/22/22 0247 09/22/22 0628  BP:  (!) 159/48  Pulse:    Resp: 18 18  Temp: 98.6 F (37 C) 98.5 F (36.9 C)  SpO2:     Physical Exam: Cardiac:  regular Lungs:  non labored Incisions:  Bilateral groins with VAC. Seal not good. [place] office. Healthy tissue in wound bed. Wet to dry dressings applied  Extremities:  left AKA well appearing, Right leg well perfused and warm Neurologic: alert and oriented  CBC    Component Value Date/Time   WBC 8.6 09/22/2022 0240   RBC 3.01 (L) 09/22/2022 0240   HGB 9.1 (L) 09/22/2022 0240   HGB 14.3 06/20/2012 2127   HCT 27.8 (L) 09/22/2022 0240   HCT 42.9 06/20/2012 2127   PLT 550 (H) 09/22/2022 0240   PLT 357 06/20/2012 2127   MCV 92.4 09/22/2022 0240   MCV 91 06/20/2012 2127   MCH 30.2 09/22/2022 0240   MCHC 32.7 09/22/2022 0240   RDW 15.9 (H) 09/22/2022 0240   RDW 13.6 06/20/2012 2127   LYMPHSABS 1.2 09/22/2022 0240   MONOABS 1.0 09/22/2022 0240   EOSABS 0.3 09/22/2022 0240   BASOSABS 0.1 09/22/2022 0240    BMET    Component Value Date/Time   NA 135 09/22/2022 0240   NA 135 (L) 06/20/2012 2127   K 3.9 09/22/2022 0240   K 4.0 06/20/2012 2127   CL 103 09/22/2022 0240   CL 100 06/20/2012 2127   CO2 25 09/22/2022 0240   CO2 27 06/20/2012 2127   GLUCOSE 160 (H) 09/22/2022 0240   GLUCOSE 250 (H) 06/20/2012 2127   BUN 11 09/22/2022 0240   BUN 20 (H) 06/20/2012 2127   CREATININE 0.73 09/22/2022 0240   CREATININE 0.60 06/20/2012 2127   CALCIUM 8.8 (L) 09/22/2022 0240   CALCIUM 8.8 06/20/2012 2127   GFRNONAA >60 09/22/2022 0240   GFRNONAA >60 06/20/2012 2127   GFRAA >60 06/20/2012 2127    INR    Component Value Date/Time   INR 1.0 08/03/2022 2156     Intake/Output Summary (Last 24 hours) at 09/22/2022 0819 Last data filed at 09/22/2022 0400 Gross per 24 hour  Intake 400.79 ml  Output 650  ml  Net -249.21 ml     Assessment/Plan:  72 y.o. female is s/p BLE revascularization, L AKA, and bilateral groin washout x2 with wound vac placement  6 Days Post-Op   Bilateral groin wound VACs removed. Wounds healthy appearing with granulation tissue. No tunneling appreciated. No purulence. WOC RN will return to replace VACs later this morning Left AKA well appearing Right leg remains well perfused and warm   Karoline Caldwell, PA-C [facility] [phone] 09/22/2022 8:19 AM  Agree with above. Continue wound vacs.   Johathan Province C. Donzetta Matters, MD [facility] [place] Office: [phone] Pager: [phone]

## 2022-09-22 NOTE — Progress Notes (Signed)
Called [facility] and gave report to RN April. All questions answered. Called back to confirm that wound vac was at facility and would be reconnected. CSW and CM aware that [facility] had concerns about transporting patient locked off. Payton Emerald, RN

## 2022-09-22 NOTE — Progress Notes (Signed)
Air leak present in wound vac,attempted to reinforced but not successful,Will continue to monitor

## 2022-09-22 NOTE — Discharge Summary (Signed)
Physician Discharge Summary  Heather [name] CVE:[id number] DOB: [birth year] DOA: 08/04/2022  PCP: [name], FNP  Admit date: 08/04/2022 Discharge date: 09/22/2022  Time spent: 40 minutes  Recommendations for Outpatient Follow-up:  Follow outpatient CBC/CMP  Follow with vascular outpatient for wound care, f/u procedures Follow with neurology for intracranial hemorrhage Follow SVT outpatient, ziopatch requested, continue coreg Adjustments to diabetes regimen, follow BG's, adjust insulin as needed Wean opiates as able  Follow with infectious disease as planned Consider weaning remeron, marinol with improved appetite    Discharge Diagnoses:  Principal Problem:   PVD (peripheral vascular disease) ([place]) Active Problems:   Hypertension, benign   S/P TAVR (transcatheter aortic valve replacement)   Tobacco use disorder   PAD (peripheral artery disease) ([facility])   ICH (intracerebral hemorrhage) ([place])   Abscess of anal and rectal regions   Decubitus ulcer of sacral region, stage 2 ([place])   Pulmonary edema   Right groin wound   Wound of left groin   Decreased oral intake   Chronic diastolic CHF (congestive heart failure) ([place])   Dysphagia   Surgical site infection   Discharge Condition: stable  Diet recommendation: heart healthy, diabetic  Filed Weights   09/16/22 0005 09/16/22 0838 09/21/22 0500  Weight: 59.4 kg 55.3 kg 55.4 kg    History of present illness:  72 year old F with PMH of DM-2, diastolic CHF, COPD, HTN, HLD, GERD, PAD, AS s/p TAVR, tobacco use disorder, anxiety, depression and schizophrenia presented to ED on 12/11 with SBP in 200s, dizziness, headache and blurry vision, and admitted to ICU for hemorrhagic cerebral stroke and BLE ischemia.  She underwent BLE femoral endarterectomy and patch angioplasty with iliac stenting and 4 compartment fasciotomies.  Postop, she remained on vent and Cleviprex.  Eventually, she was extubated and transferred to hospitalist service  on 12/17.  She also underwent left AKA and excisional debridement of bilateral groin wounds with VAC placement on 12/21.  Hospital course and then noteworthy for episode of hypoglycemia with insulin adjustments.  Initially required tube feed that was later discontinued.  Remains physically deconditioned.  Wound VAC discontinued on 1/18.  She is now WTD dressing with silver to bilateral groins. Concern about persistent or worsening greenish drainage with tunneling.  Underwent groin debridement and wound VAC placement on 1/23.  Antibiotics broadened to IV cefepime and Flagyl per ID and VVS.  Bilateral wound vacs in place.  Plan for 6 weeks abx.  Follow up with ID and vascular planned.  Hospitalization complicated by COVID 19 infection, symptoms resolved at this time.  Stable for discharge on 1/29.  See below for additional details  Hospital Course:  Assessment and Plan: Peripheral arterial disease/bilateral lower extremity ischemia -Bilateral CFA endarterectomy with bovine patch angioplasty -Bilateral iliofemoral embolectomy, stenting on 12/11 -Bilateral CIA, stent Rt EIA and 4 compartment fasciotomies on 12/11 -Left AKA and excisional debridement of bilateral groin wounds with VAC placement 08/15/23 -1/21-concern about persistent or worsening greenish drainage with tunneling of left groin wound -1/23 s/p bilateral groin wound washout, soft tissue debridement, myriad morcell powder placement, vac dressing placement - vascular to reevaluate wound 1/29, recommended continuing vac, healthy appearing wounds - change wound vac three times weekly, follow with vascular for wound recommendations -cefepime/flagyl x 6 weeks per ID, appreciate recs - follow with ID outpatient -Continue Plavix and statin. -Patient has left arm PICC in place, remove once done with abx.  -currently on oxycontin, transition to oxycodone at discharge and try to deescalate use as able  Acute intracranial hemorrhage: MRI showed  acute intraparenchymal hematoma in cerebellar vermis measuring 11 cc in volume and mild mass effect upon the fourth ventricle. Seen by neurology.  Neurologically stable Follow with neurology outpatient    Acute respiratory failure with hypoxia COPD/tobacco use disorder: -Extubated 12/14> On room air currently. -Continue current Brovana Pulmicort,Yupleri and albuterol -> discharge on her home advair/albuterol   Malaise  Myalgias  COVID 19 viral infection Remains afebrile, normal leukocytosis Declines further paxlovid after N/V after taking Isolate x10 days from 1/25   Chronic diastolic CHF/HTN: TTE with LVEF of 60 to 65%, indeterminate DD.  Appears euvolemic on exam. -Continue Coreg.   SVT  Sinus Tach Noted on tele, will continue coreg She's asymptomatic Will ask for ziopatch at discharge   Uncontrolled NIDDM-2 with hyperglycemia and hypoglycemia: A1c 12.9% on 12/12.  CBG within acceptable range. -Continue current insulin regimen - adjusted for discharge, follow and adjust at SNF as able -Continue statin    Essential hypertension:  -Coreg 6.25 mg twice daily    Hx of CAD/HLD: Stable. -Continue Coreg, Lipitor and Plavix   AS s/p TAVR: Noted.   Acute blood loss anemia in the setting of surgery: s/p 3 units PRBC so far. Hb stable -Monitor intermittently -follow outpatient    Depression,bipolar,schizophrenia,insomnia, chronic back pain: mood has been stable. -Continue Lexapro, Remeron and trazodone -Pain medications as above.    Dysphagia:Likely related to [place]. Cortrack has been removed. She does not want to have NG tube or tube feeding. Family encouraged for oral nutrition.  Dysphagia seems to have resolved. -Upgraded to regular diet   Increased nutrient needs/decreased oral intake: Body mass index is 23.83 kg/m. Nutrition Problem: Increased nutrient needs Etiology: wound healing Signs/Symptoms: estimated needs Interventions: Refer to RD note for recommendations    Pressure skin injury: POA Pressure Injury 08/04/22 Sacrum Stage 2 -  Partial thickness loss of dermis presenting as Cid Agena shallow open injury with Maecyn Panning red, pink wound bed without slough. (Active)  08/04/22 0100  Location: Sacrum  Location Orientation:   Staging: Stage 2 -  Partial thickness loss of dermis presenting as Whisper Kurka shallow open injury with Lashya Passe red, pink wound bed without slough.  Wound Description (Comments):   Present on Admission: Yes  Continue wound care at facility     Procedures: 1/23 Bilateral groin wound washout Soft tissue debridement Myriad Morcell powder placement 2500g VAC dressing placement   EEG Impression and clinical correlation: This EEG was obtained while awake and drowsy and is abnormal due to moderate diffuse slowing indicative of global cerebral dysfunction. Epileptiform abnormalities were not seen during this recording.   12/21 Procedure Performed: 1.  Left above-knee amputation 2.  Sharp excisional debridement of bilateral surgical groin wounds right side 7 x 6 x 4 cm left side 10 x 4 x 3 cm with pulse lavage and wound VAC placement  12/11 1.  Bilateral common femoral endarterectomy with bovine pericardial patch angioplasty 2.  Bilateral iliofemoral embolectomy with 4 Fogarty 3.  Bilateral lower extremity embolectomy with long 3 Fogarty and short 4 Fogarty 4.  Aortogram 5.  Stent of bilateral common iliac arteries with 7 x 59 mm VBX 6.  Stent of right external iliac artery with 7 x 100 mm Innova and stent of left external iliac artery with 7 x 60 mm Innova 7.  Bilateral 4 compartment fasciotomies  Consultations: Vascular Neurology Infectious disease   Discharge Exam: Vitals:   09/22/22 0912 09/22/22 0915  BP:  (!) 166/112  Pulse:  [name])  107  Resp:  18  Temp:  98.3 F (36.8 C)  SpO2: 93% 93%   Discussed with patient's daughter Mickle Plumb) regarding plan for discharge Husband didn't answer phone  General: No acute  distress. Cardiovascular: RRR Lungs: unlabored Abdomen: Soft, nontender, nondistended Neurological: Alert and oriented 3. Moves all extremities 4. Cranial nerves II through XII grossly intact. Extremities: L AKA, bilateral groin wound exam deferred today, WOC note coming behind me to replace vac - per vascular note, wounds helathy appearing with granulation tissue, no tunneling appreciated, no purulence  Discharge Instructions   Discharge Instructions     [facility] pharmacist to adjust dose for Vancomycin, Aminoglycosides and other anti-infective therapies as requested by physician.   Complete by: As directed    [facility] to provide Cath Flo '2mg'$    Complete by: As directed    Administer for PICC line occlusion and as ordered by physician for other access device issues.   Ambulatory referral to Neurology   Complete by: As directed    An appointment is requested in approximately: 4 weeks   Anaphylaxis Kit: Provided to treat any anaphylactic reaction to the medication being provided to the patient if First Dose or when requested by physician   Complete by: As directed    Epinephrine '1mg'$ /ml vial / amp: Administer 0.'3mg'$  (0.26m) subcutaneously once for moderate to severe anaphylaxis, nurse to call physician and pharmacy when reaction occurs and call 911 if needed for immediate care   Diphenhydramine '50mg'$ /ml IV vial: Administer 25-'50mg'$  IV/IM PRN for first dose reaction, rash, itching, mild reaction, nurse to call physician and pharmacy when reaction occurs   Sodium Chloride 0.9% NS 5065mIV: Administer if needed for hypovolemic blood pressure drop or as ordered by physician after call to physician with anaphylactic reaction   Call MD for:  difficulty breathing, headache or visual disturbances   Complete by: As directed    Call MD for:  extreme fatigue   Complete by: As directed    Call MD for:  hives   Complete by: As directed    Call MD for:  persistant dizziness  or light-headedness   Complete by: As directed    Call MD for:  persistant nausea and vomiting   Complete by: As directed    Call MD for:  redness, tenderness, or signs of infection (pain, swelling, redness, odor or green/yellow discharge around incision site)   Complete by: As directed    Call MD for:  severe uncontrolled pain   Complete by: As directed    Call MD for:  temperature >100.4   Complete by: As directed    Change dressing on IV access line weekly and PRN   Complete by: As directed    Diet - low sodium heart healthy   Complete by: As directed    Discharge instructions   Complete by: As directed    You were seen after Corinthia Helmers stroke and also had ischemic limbs.  You had Keri Veale brain bleed which was treated with neurology.  You should follow up with neurology as an outpatient.  You had poor blood flow and had several procedures with vascular surgery.  You recently had your wound debrided and will be on long term antibiotics to treat possible infection.  You have Inara Dike wound vac which will be followed by vascular surgery as an outpatient.  You're currently on plavix and atorvastatin for your vascular disease.  Follow with vascular outpatient.  Your hospitalization was complicated by covid  19 infection.  You're currently asymptomatic.  You'll have to continue to isolate per CDC guidelines (your test was positive on 1/25, I recommend isolation for 10 days as long as your symptoms have improved).  You had occasional fast heart rates seen on telemetry.  I'm going to ask cardiology to send you Lekita Kerekes cardiac monitor so we can watch your heart rates.  You should follow with cardiology outpatient.  I've reduced your insulin dose.  They'll need to watch your blood sugars at the SNF and adjust as needed.  You've been getting long acting pain meds here, but not much in addition.  I think we should transition back to short acting pain medicines and try to limit pain meds as much as possible.  You're on  marinol and remeron for appetite stimulation.  These can be discontinued as your appetite improves.  Return for new, recurrent, or worsening symptoms.  Please ask your PCP to request records from this hospitalization so they know what was done and what the next steps will be.   Discharge wound care:   Complete by: As directed    Follow sacral decub outpatient, continue to offload, foam dressing  Change wound vac three times Karsyn Jamie week, follow with vascular for questions or concerns regarding wound vac   Flush IV access with Sodium Chloride 0.9% and Heparin 10 units/ml or 100 units/ml   Complete by: As directed    Home infusion instructions - [facility]   Complete by: As directed    Instructions: Flush IV access with Sodium Chloride 0.9% and Heparin 10units/ml or 100units/ml   Change dressing on IV access line: Weekly and PRN   Instructions Cath Flo '2mg'$ : Administer for PICC Line occlusion and as ordered by physician for other access device   [facility] pharmacist to adjust dose for: Vancomycin, Aminoglycosides and other anti-infective therapies as requested by physician   Increase activity slowly   Complete by: As directed    Method of administration may be changed at the discretion of home infusion pharmacist based upon assessment of the patient and/or caregiver's ability to self-administer the medication ordered   Complete by: As directed       Allergies as of 09/22/2022       Reactions   Iodinated Contrast Media Hives   Sulfa Antibiotics Hives   Shellfish Allergy Nausea And Vomiting, Rash, Other (See Comments)   Scallops specifically cause VOMITING        Medication List     STOP taking these medications    aspirin EC 81 MG tablet   diphenhydrAMINE 25 mg capsule Commonly known as: BENADRYL   famotidine 20 MG tablet Commonly known as: PEPCID   furosemide 20 MG tablet Commonly known as: LASIX   Levemir FlexTouch 100 UNIT/ML FlexPen Generic drug:  insulin detemir   losartan 50 MG tablet Commonly known as: COZAAR   meclizine 25 MG tablet Commonly known as: ANTIVERT   metFORMIN 750 MG 24 hr tablet Commonly known as: GLUCOPHAGE-XR   metoprolol succinate 25 MG 24 hr tablet Commonly known as: TOPROL-XL   nitroGLYCERIN 0.4 MG SL tablet Commonly known as: NITROSTAT   NovoLOG FlexPen 100 UNIT/ML FlexPen Generic drug: insulin aspart Replaced by: insulin aspart 100 UNIT/ML injection   pregabalin 50 MG capsule Commonly known as: LYRICA   traMADol 50 MG tablet Commonly known as: ULTRAM       TAKE these medications    albuterol 108 (90 Base) MCG/ACT inhaler Commonly known as: VENTOLIN  HFA Inhale 2 puffs into the lungs every 4 (four) hours as needed for wheezing or shortness of breath.   atorvastatin 80 MG tablet Commonly known as: LIPITOR Take 80 mg by mouth at bedtime.   carvedilol 6.25 MG tablet Commonly known as: COREG Take 1 tablet (6.25 mg total) by mouth 2 (two) times daily with Kinzly Pierrelouis meal.   ceFEPime  IVPB Commonly known as: MAXIPIME Inject 2 g into the vein every 8 (eight) hours. Indication:  infected graft  First Dose: Yes Last Day of Therapy:  10/28/22 Labs - Once weekly:  CBC/D and BMP, Labs - Every other week:  ESR and CRP Method of administration: IV Push Remove PICC line at completion of antibiotic therapy Method of administration may be changed at the discretion of home infusion pharmacist based upon assessment of the patient and/or caregiver's ability to self-administer the medication ordered.   cetirizine 10 MG tablet Commonly known as: ZYRTEC Take 10 mg by mouth daily.   cholecalciferol 1000 units tablet Commonly known as: VITAMIN D Take 1,000 Units by mouth daily.   clopidogrel 75 MG tablet Commonly known as: PLAVIX Take 1 tablet (75 mg total) by mouth daily.   dronabinol 2.5 MG capsule Commonly known as: MARINOL Take 1 capsule (2.5 mg total) by mouth 2 (two) times daily before lunch and  supper.   escitalopram 10 MG tablet Commonly known as: LEXAPRO Take 1 tablet (10 mg total) by mouth daily. What changed:  medication strength how much to take   fluticasone 50 MCG/ACT nasal spray Commonly known as: FLONASE Place 1 spray into both nostrils daily as needed for allergies.   Fluticasone-Salmeterol 250-50 MCG/DOSE Aepb Commonly known as: ADVAIR Inhale 1 puff into the lungs daily.   insulin aspart 100 UNIT/ML injection Commonly known as: novoLOG Inject 3 Units into the skin 3 (three) times daily with meals. Hold for NPO or consuming less than 50% of meals Replaces: NovoLOG FlexPen 100 UNIT/ML FlexPen   insulin glargine-yfgn 100 UNIT/ML injection Commonly known as: SEMGLEE Inject 0.05 mLs (5 Units total) into the skin 2 (two) times daily.   metroNIDAZOLE 500 MG tablet Commonly known as: FLAGYL Take 1 tablet (500 mg total) by mouth every 12 (twelve) hours.   mirtazapine 7.5 MG tablet Commonly known as: REMERON Take 1 tablet (7.5 mg total) by mouth at bedtime.   oxyCODONE 5 MG immediate release tablet Commonly known as: Oxy IR/ROXICODONE Take 1 tablet (5 mg total) by mouth every 6 (six) hours as needed for up to 5 days for moderate pain.   pantoprazole 40 MG tablet Commonly known as: PROTONIX Take 1 tablet (40 mg total) by mouth daily.   polyethylene glycol 17 g packet Commonly known as: MIRALAX / GLYCOLAX Take 17 g by mouth daily. Titrate as needed for constipation.   traZODone 50 MG tablet Commonly known as: DESYREL Take 0.5 tablets (25 mg total) by mouth at bedtime as needed for sleep. What changed: See the new instructions.   WOMENS MULTI VITAMIN & MINERAL PO Take 1 tablet by mouth every morning.               Discharge Care Instructions  (From admission, onward)           Start     Ordered   09/22/22 0000  Change dressing on IV access line weekly and PRN  (Home infusion instructions - [facility] )        09/22/22 1101    09/22/22 0000  Discharge wound care:       Comments: Follow sacral decub outpatient, continue to offload, foam dressing  Change wound vac three times Laniece Hornbaker week, follow with vascular for questions or concerns regarding wound vac   09/22/22 1101           Allergies  Allergen Reactions   Iodinated Contrast Media Hives   Sulfa Antibiotics Hives   Shellfish Allergy Nausea And Vomiting, Rash and Other (See Comments)    Scallops specifically cause VOMITING      Contact information for follow-up providers     [facility] Follow up in 3 week(s).   Specialty: Vascular Surgery Why: Office will call you to arrange your appt (sent). Contact information: [address] [place] [phone]        Rosiland Oz, MD Follow up on 09/30/2022.   Specialty: Infectious Diseases Why: Hospital Discharge antibiotic Follow Up 09:30 am appointment. Please arrive 15 min early to register. Contact information: [address] [place] [address] [phone]         Garvin Fila, MD Follow up.   Specialties: Neurology, Radiology Why: you should get Sharone Almond call for an appt from [facility], if you don't please call Contact information: [place] [address] [phone]              Contact information for after-discharge care     Destination     [facility] Preferred SNF .   Service: Skilled Nursing Contact information: [place] [phone]                      The results of significant diagnostics from this hospitalization (including imaging, microbiology, ancillary and laboratory) are listed below for reference.    Significant Diagnostic Studies: [place] EKG SITE RITE  Result Date: 09/19/2022 If Site Rite image not attached, placement could not be confirmed due to current cardiac  rhythm.  DG CHEST PORT 1 VIEW  Result Date: 09/18/2022 CLINICAL DATA:  COVID EXAM: PORTABLE CHEST 1 VIEW COMPARISON:  None Available. FINDINGS: The heart size and mediastinal contours are within normal limits. Patient is status post TAVR, unchanged. Both lungs are clear. The visualized skeletal structures are unremarkable. IMPRESSION: No active disease. Electronically Signed   By: Ronney Asters M.D.   On: 09/18/2022 15:34   DG CHEST PORT 1 VIEW  Result Date: 08/28/2022 CLINICAL DATA:  Dyspnea. EXAM: PORTABLE CHEST 1 VIEW COMPARISON:  08/19/2022. FINDINGS: Heart size and mediastinal contour is stable. Jakeel Starliper TAVR stent is noted. There is atherosclerotic calcification of the aorta. Interstitial prominence is noted bilaterally. There are small bilateral pleural effusions. No pneumothorax. No acute osseous abnormality. Dynver Clemson right internal jugular central venous catheter is stable. An enteric tube courses over the stomach and out of the field of view. IMPRESSION: 1. Interstitial prominence bilaterally, possible edema. 2. Small bilateral pleural effusions. Electronically Signed   By: Brett Fairy M.D.   On: 08/28/2022 03:37    Microbiology: Recent Results (from the past 240 hour(s))  Surgical pcr screen     Status: None   Collection Time: 09/16/22  9:00 AM   Specimen: Nasal Mucosa; Nasal Swab  Result Value Ref Range Status   MRSA, PCR NEGATIVE NEGATIVE Final   Staphylococcus aureus NEGATIVE NEGATIVE Final    Comment: (NOTE) The Xpert SA Assay (FDA approved for NASAL specimens in patients  76 years of age and older), is one component of Eathan Groman comprehensive surveillance program. It is not intended to diagnose infection nor to guide or monitor treatment. Performed at [facility], [place] [address], [place], [place] [address]   Resp panel by RT-PCR (RSV, Flu Zenobia Kuennen&B, Covid) Anterior Nasal Swab     Status: Abnormal   Collection Time: 09/18/22 11:40 AM   Specimen: Anterior Nasal Swab  Result Value Ref Range  Status   SARS Coronavirus 2 by RT PCR POSITIVE (Jose Alleyne) NEGATIVE Final    Comment: (NOTE) SARS-CoV-2 target nucleic acids are DETECTED.  The SARS-CoV-2 RNA is generally detectable in upper respiratory specimens during the acute phase of infection. Positive results are indicative of the presence of the identified virus, but do not rule out bacterial infection or co-infection with other pathogens not detected by the test. Clinical correlation with patient history and other diagnostic information is necessary to determine patient infection status. The expected result is Negative.  Fact Sheet for Patients: EntrepreneurPulse.com.au  Fact Sheet for Healthcare Providers: IncredibleEmployment.be  This test is not yet approved or cleared by the [place] FDA and  has been authorized for detection and/or diagnosis of SARS-CoV-2 by FDA under an Emergency Use Authorization (EUA).  This EUA will remain in effect (meaning this test can be used) for the duration of  the COVID-19 declaration under Section 564(b)(1) of the [place] ct, 21 U.S.C. section 360bbb-3(b)(1), unless the authorization is terminated or revoked sooner.     Influenza Granvil Djordjevic by PCR NEGATIVE NEGATIVE Final   Influenza B by PCR NEGATIVE NEGATIVE Final    Comment: (NOTE) The Xpert Xpress SARS-CoV-2/FLU/RSV plus assay is intended as an aid in the diagnosis of influenza from Nasopharyngeal swab specimens and should not be used as Thora Scherman sole basis for treatment. Nasal washings and aspirates are unacceptable for Xpert Xpress SARS-CoV-2/FLU/RSV testing.  Fact Sheet for Patients: EntrepreneurPulse.com.au  Fact Sheet for Healthcare Providers: IncredibleEmployment.be  This test is not yet approved or cleared by the [place] FDA and has been authorized for detection and/or diagnosis of SARS-CoV-2 by FDA under an Emergency Use Authorization (EUA). This EUA will remain in  effect (meaning this test can be used) for the duration of the COVID-19 declaration under Section 564(b)(1) of the Act, 21 U.S.C. section 360bbb-3(b)(1), unless the authorization is terminated or revoked.     Resp Syncytial Virus by PCR NEGATIVE NEGATIVE Final    Comment: (NOTE) Fact Sheet for Patients: EntrepreneurPulse.com.au  Fact Sheet for Healthcare Providers: IncredibleEmployment.be  This test is not yet approved or cleared by the [place] FDA and has been authorized for detection and/or diagnosis of SARS-CoV-2 by FDA under an Emergency Use Authorization (EUA). This EUA will remain in effect (meaning this test can be used) for the duration of the COVID-19 declaration under Section 564(b)(1) of the Act, 21 U.S.C. section 360bbb-3(b)(1), unless the authorization is terminated or revoked.  Performed at [facility], [place] [address] [place], [place] [address]      Labs: Basic Metabolic Panel: Recent Labs  Lab 09/17/22 0329 09/18/22 0748 09/19/22 0227 09/20/22 0502 09/22/22 0240  NA 134* 136 137 137 135  K 4.9 4.4 3.7 3.9 3.9  CL 104 105 108 105 103  CO2 21* '22 23 26 25  '$ GLUCOSE 333* 172* 115* 151* 160*  BUN '15 13 9 11 11  '$ CREATININE 0.86 0.74 0.60 0.77 0.73  CALCIUM 8.1* 8.5* 8.7* 8.4* 8.8*  MG 1.8  --  1.7  1.7 1.8  PHOS 4.0  --  2.2* 2.7 2.8   Liver Function Tests: Recent Labs  Lab 09/17/22 0329 09/19/22 0227 09/20/22 0502 09/22/22 0240  AST  --  13* 13* 13*  ALT  --  6 <5 <5  ALKPHOS  --  67 70 64  BILITOT  --  0.5 0.4 0.5  PROT  --  5.6* 5.3* 5.6*  ALBUMIN 2.1* 2.1* 2.1* 2.1*   No results for input(s): "LIPASE", "AMYLASE" in the last 168 hours. No results for input(s): "AMMONIA" in the last 168 hours. CBC: Recent Labs  Lab 09/17/22 0329 09/18/22 0748 09/19/22 0227 09/20/22 0502 09/22/22 0240  WBC 8.1 6.6 5.6 6.2 8.6  NEUTROABS  --   --  3.4 3.8 5.9  HGB 9.4* 9.1* 9.8* 9.2* 9.1*  HCT 30.8*  29.8* 31.6* 29.3* 27.8*  MCV 93.6 93.1 93.2 91.8 92.4  PLT 475* 525* 537* 542* 550*   Cardiac Enzymes: No results for input(s): "CKTOTAL", "CKMB", "CKMBINDEX", "TROPONINI" in the last 168 hours. BNP: BNP (last 3 results) Recent Labs    08/16/22 0434  BNP 672.4*    ProBNP (last 3 results) No results for input(s): "PROBNP" in the last 8760 hours.  CBG: Recent Labs  Lab 09/21/22 0557 09/21/22 1159 09/21/22 1647 09/21/22 2156 09/22/22 0626  GLUCAP 151* 214* 279* 190* 152*       Signed:  Fayrene Helper MD.  [facility] 09/22/2022, 11:14 AM

## 2022-09-22 NOTE — Progress Notes (Unsigned)
{  Select_TRH_Note:[id number]}

## 2022-09-22 NOTE — TOC Progression Note (Addendum)
Transition of Care [facility]) - Progression Note    Patient Details  Name: Heather [name] MRN: [id number] Date of Birth: [birth year]  Transition of Care [facility]) CM/SW [place], [place] Phone Number: 09/22/2022, 10:11 AM  Clinical Narrative:     Update: SNF confirmed patient can admit today- MD updated   10:11am- CSW spoke with [facility]- Per SNF they have insurance auth , however, CSW informed patient needs wound vac and she tested covid positive on 09/18/22. She states she will follow up to see if the facility has wound vac and if they have a room for patient.   TOC waiting on SNF to confim when they can admit patient.  Thurmond Butts, MSW, LCSW Clinical Social Worker      Expected Discharge Plan: Skilled Nursing Facility Barriers to Discharge: Ship broker, Continued Medical Work up  Expected Discharge Plan and Services In-house Referral: Clinical Social Work   Post Acute Care Choice: [place] (LTAC) Living arrangements for the past 2 months: [place]                                       Social Determinants of Health (SDOH) Interventions [place]: Food Insecurity Present (08/11/2022)  Housing: Low Risk  (08/11/2022)  Transportation Needs: No Transportation Needs (08/11/2022)  Utilities: Not At Risk (08/11/2022)  Financial Resource Strain: Medium Risk (10/19/2018)  Physical Activity: Inactive (10/19/2018)  Social Connections: Unknown (10/19/2018)  Tobacco Use: High Risk (09/17/2022)    Readmission Risk Interventions    11/13/2020    2:27 PM  Readmission Risk Prevention Plan  Transportation Screening Complete  PCP or Specialist Appt within 3-5 Days Complete  HRI or [place] Complete  Social Work Consult for [place] Planning/Counseling Complete  Palliative Care Screening Not Applicable  Medication Review Press photographer) Complete

## 2022-09-22 NOTE — Progress Notes (Signed)
PROGRESS NOTE    Heather [name]  JSH:[id number] DOB: 03-Dec-1950 DOA: 08/04/2022 PCP: [name], FNP  No chief complaint on file.   Brief Narrative:  72 year old F with PMH of DM-2, diastolic CHF, COPD, HTN, HLD, GERD, PAD, AS s/p TAVR, tobacco use disorder, anxiety, depression and schizophrenia presented to ED on 12/11 with SBP in 200s, dizziness, headache and blurry vision, and admitted to ICU for hemorrhagic cerebral stroke and BLE ischemia.  She underwent BLE femoral endarterectomy and patch angioplasty with iliac stenting and 4 compartment fasciotomies.  Postop, she remained on vent and Cleviprex.  Eventually, she was extubated and transferred to hospitalist service on 12/17.  She also underwent left AKA and excisional debridement of bilateral groin wounds with VAC placement on 12/21.  Hospital course and then noteworthy for episode of hypoglycemia with insulin adjustments.  Initially required tube feed that was later discontinued.  Remains physically deconditioned.  Wound VAC discontinued on 1/18.  She is now WTD dressing with silver to bilateral groins. Concern about persistent or worsening greenish drainage with tunneling.  Underwent groin debridement and wound VAC placement on 1/23.  Antibiotics broadened to IV cefepime and Flagyl per ID and VVS.   Assessment & Plan:   Principal Problem:   PVD (peripheral vascular disease) ([place]) Active Problems:   Hypertension, benign   S/P TAVR (transcatheter aortic valve replacement)   Tobacco use disorder   PAD (peripheral artery disease) (HCC)   ICH (intracerebral hemorrhage) (HCC)   Abscess of anal and rectal regions   Decubitus ulcer of sacral region, stage 2 ([place])   Pulmonary edema   Right groin wound   Wound of left groin   Decreased oral intake   Chronic diastolic CHF (congestive heart failure) ([place])   Dysphagia   Surgical site infection  Peripheral arterial disease/bilateral lower extremity ischemia -Bilateral CFA endarterectomy  with bovine patch angioplasty -Bilateral iliofemoral embolectomy, stenting on 12/11 -Bilateral CIA, stent Rt EIA and 4 compartment fasciotomies on 12/11 -Left AKA and excisional debridement of bilateral groin wounds with VAC placement 08/15/23 -1/21-concern about persistent or worsening greenish drainage with tunneling of left groin wound -1/23 s/p bilateral groin wound washout, soft tissue debridement, myriad morcell powder placement, vac dressing placement - vascular to reevaluate wound 1/29, recommended continuing vac, healthy appearing wounds -cefepime/flagyl x 6 weeks per ID, appreciate recs -Continue Plavix and statin. -Patient has left arm midline in place.  -Tylenol, oxycodone, Robaxin and Dilaudid for pain control   Acute intracranial hemorrhage: MRI showed acute intraparenchymal hematoma in cerebellar vermis measuring 11 cc in volume and mild mass effect upon the fourth ventricle. Seen by neurology.  Neurologically stable  Acute respiratory failure with hypoxia COPD/tobacco use disorder: -Extubated 12/14> On room air currently. -Continue current [place]vana Pulmicort,Yupleri and albuterol as needed.  Malaise  Myalgias  COVID 19 viral infection Remains afebrile, normal leukocytosis Declines further paxlovid after N/V after taking  Chronic diastolic CHF/HTN: TTE with LVEF of 60 to 65%, indeterminate DD.  Appears euvolemic on exam. -Continue Coreg.  SVT  Sinus Tach Noted on tele, will continue coreg She's asymptomatic Consider monitor at discharge   Uncontrolled NIDDM-2 with hyperglycemia and hypoglycemia: A1c 12.9% on 12/12.  CBG within acceptable range. -Continue current insulin regimen -Continue statin    Essential hypertension: Mild hypotension earlier this morning. -Decreased Coreg to 6.25 mg twice daily    Hx of CAD/HLD: Stable. -Continue Coreg, Lipitor and Plavix   AS s/p TAVR: Noted on stable.   Acute blood loss anemia in  the setting of surgery: s/p 3 units  PRBC so far. Hb stable -Monitor intermittently.   Depression,bipolar,schizophrenia,insomnia, chronic back pain: mood has been stable. -Continue Lexapro, Remeron and trazodone -Pain medications as above.    Dysphagia:Likely related to [place]. [place] has been removed. She does not want to have NG tube or tube feeding. Family encouraged for oral nutrition.  Dysphagia seems to have resolved. -Upgraded to regular diet   Increased nutrient needs/decreased oral intake: Body mass index is 23.83 kg/m. Nutrition Problem: Increased nutrient needs Etiology: wound healing Signs/Symptoms: estimated needs Interventions: Refer to RD note for recommendations   Pressure skin injury: POA Pressure Injury 08/04/22 Sacrum Stage 2 -  Partial thickness loss of dermis presenting as [name] shallow open injury with [name] red, pink wound bed without slough. (Active)  08/04/22 0100  Location: Sacrum  Location Orientation:   Staging: Stage 2 -  Partial thickness loss of dermis presenting as [name] shallow open injury with [name] red, pink wound bed without slough.  Wound Description (Comments):   Present on Admission: Yes     DVT prophylaxis: heparin Code Status: full Family Communication: none - called husband no answer 1/24 Disposition:   Status is: Inpatient Remains inpatient appropriate because: continued inpatient management   Consultants:  ID vascular  Procedures:  1/23 Bilateral groin wound washout Soft tissue debridement Myriad Morcell powder placement 2500g VAC dressing placement  Antimicrobials:  Anti-infectives (From admission, onward)    Start     Dose/Rate Route Frequency Ordered Stop   09/19/22 0100  nirmatrelvir/ritonavir (PAXLOVID) 3 tablet  Status:  Discontinued        3 tablet Oral 2 times daily 09/19/22 0003 09/20/22 1418   09/16/22 1600  ceFEPIme (MAXIPIME) 2 g in sodium chloride 0.9 % 100 mL IVPB        2 g 200 mL/hr over 30 Minutes Intravenous Every 8 hours 09/16/22 1232     09/09/22  1100  metroNIDAZOLE (FLAGYL) tablet 500 mg        500 mg Oral Every 12 hours 09/09/22 1002     08/20/22 2200  metroNIDAZOLE (FLAGYL) IVPB 500 mg  Status:  Discontinued        500 mg 100 mL/hr over 60 Minutes Intravenous Every 12 hours 08/20/22 1110 08/20/22 1111   08/20/22 2200  metroNIDAZOLE (FLAGYL) tablet 500 mg  Status:  Discontinued        500 mg Per Tube Every 12 hours 08/20/22 1111 09/09/22 1002   08/18/22 2300  ceFAZolin (ANCEF) IVPB 2g/100 mL premix  Status:  Discontinued        2 g 200 mL/hr over 30 Minutes Intravenous Every 8 hours 08/18/22 1455 09/16/22 1232   08/18/22 2300  metroNIDAZOLE (FLAGYL) IVPB 500 mg  Status:  Discontinued        500 mg 100 mL/hr over 60 Minutes Intravenous Every 6 hours 08/18/22 1455 08/20/22 1110   08/15/22 1100  vancomycin (VANCOCIN) IVPB 1000 mg/200 mL premix  Status:  Discontinued        1,000 mg 200 mL/hr over 60 Minutes Intravenous Every 24 hours 08/14/22 1006 08/15/22 1307   08/14/22 1400  piperacillin-tazobactam (ZOSYN) IVPB 3.375 g  Status:  Discontinued        3.375 g 12.5 mL/hr over 240 Minutes Intravenous Every 8 hours 08/14/22 1006 08/18/22 1455   08/14/22 1100  vancomycin (VANCOREADY) IVPB 1500 mg/300 mL        1,500 mg 150 mL/hr over 120 Minutes Intravenous  Once 08/14/22 1006  08/14/22 1502   08/04/22 1045  ceFAZolin (ANCEF) IVPB 2g/100 mL premix        2 g 200 mL/hr over 30 Minutes Intravenous On call to O.R. 08/04/22 0958 08/04/22 1100       Subjective: No complaints other than constipation this AM  Objective: Vitals:   09/22/22 0247 09/22/22 0628 09/22/22 0912 09/22/22 0915  BP:  (!) 159/48  (!) 166/112  Pulse:    (!) 107  Resp: '18 18  18  '$ Temp: 98.6 F (37 C) 98.5 F (36.9 C)  98.3 F (36.8 C)  TempSrc: Oral Oral  Oral  SpO2:   93% 93%  Weight:      Height:        Intake/Output Summary (Last 24 hours) at 09/22/2022 0936 Last data filed at 09/22/2022 0928 Gross per 24 hour  Intake 400.79 ml  Output 875 ml  Net  -474.21 ml   Filed Weights   09/16/22 0005 09/16/22 0838 09/21/22 0500  Weight: 59.4 kg 55.3 kg 55.4 kg    Examination:  General: No acute distress. Cardiovascular: RRR - tele reviewed, SVT/sinus tach noted overnight Lungs: unlabored Abdomen: Soft, nontender, nondistended Neurological: Alert and oriented 3. Moves all extremities 4 with equal strength. Cranial nerves II through XII grossly intact. Extremities: L AKA   Data Reviewed: I have personally reviewed following labs and imaging studies  CBC: Recent Labs  Lab 09/17/22 0329 09/18/22 0748 09/19/22 0227 09/20/22 0502 09/22/22 0240  WBC 8.1 6.6 5.6 6.2 8.6  NEUTROABS  --   --  3.4 3.8 5.9  HGB 9.4* 9.1* 9.8* 9.2* 9.1*  HCT 30.8* 29.8* 31.6* 29.3* 27.8*  MCV 93.6 93.1 93.2 91.8 92.4  PLT 475* 525* 537* 542* 550*    Basic Metabolic Panel: Recent Labs  Lab 09/17/22 0329 09/18/22 0748 09/19/22 0227 09/20/22 0502 09/22/22 0240  NA 134* 136 137 137 135  K 4.9 4.4 3.7 3.9 3.9  CL 104 105 108 105 103  CO2 21* '22 23 26 25  '$ GLUCOSE 333* 172* 115* 151* 160*  BUN '15 13 9 11 11  '$ CREATININE 0.86 0.74 0.60 0.77 0.73  CALCIUM 8.1* 8.5* 8.7* 8.4* 8.8*  MG 1.8  --  1.7 1.7 1.8  PHOS 4.0  --  2.2* 2.7 2.8    GFR: Estimated Creatinine Clearance: 51.9 mL/min (by C-G formula based on SCr of 0.73 mg/dL).  Liver Function Tests: Recent Labs  Lab 09/17/22 0329 09/19/22 0227 09/20/22 0502 09/22/22 0240  AST  --  13* 13* 13*  ALT  --  6 <5 <5  ALKPHOS  --  67 70 64  BILITOT  --  0.5 0.4 0.5  PROT  --  5.6* 5.3* 5.6*  ALBUMIN 2.1* 2.1* 2.1* 2.1*    CBG: Recent Labs  Lab 09/21/22 0557 09/21/22 1159 09/21/22 1647 09/21/22 2156 09/22/22 0626  GLUCAP 151* 214* 279* 190* 152*     Recent Results (from the past 240 hour(s))  Surgical pcr screen     Status: None   Collection Time: 09/16/22  9:00 AM   Specimen: Nasal Mucosa; Nasal Swab  Result Value Ref Range Status   MRSA, PCR NEGATIVE NEGATIVE Final    Staphylococcus aureus NEGATIVE NEGATIVE Final    Comment: (NOTE) The Xpert SA Assay (FDA approved for NASAL specimens in patients 5 years of age and older), is one component of Anamae Rochelle comprehensive surveillance program. It is not intended to diagnose infection nor to guide or monitor treatment. Performed at [facility]  [facility], [place] [address], [place], [place] [address]   Resp panel by RT-PCR (RSV, Flu Marcin Holte&B, Covid) Anterior Nasal Swab     Status: Abnormal   Collection Time: 09/18/22 11:40 AM   Specimen: Anterior Nasal Swab  Result Value Ref Range Status   SARS Coronavirus 2 by RT PCR POSITIVE (Gerome Kokesh) NEGATIVE Final    Comment: (NOTE) SARS-CoV-2 target nucleic acids are DETECTED.  The SARS-CoV-2 RNA is generally detectable in upper respiratory specimens during the acute phase of infection. Positive results are indicative of the presence of the identified virus, but do not rule out bacterial infection or co-infection with other pathogens not detected by the test. Clinical correlation with patient history and other diagnostic information is necessary to determine patient infection status. The expected result is Negative.  Fact Sheet for Patients: EntrepreneurPulse.com.au  Fact Sheet for Healthcare Providers: IncredibleEmployment.be  This test is not yet approved or cleared by the [place] FDA and  has been authorized for detection and/or diagnosis of SARS-CoV-2 by FDA under an Emergency Use Authorization (EUA).  This EUA will remain in effect (meaning this test can be used) for the duration of  the COVID-19 declaration under Section 564(b)(1) of the [place] ct, 21 U.S.C. section 360bbb-3(b)(1), unless the authorization is terminated or revoked sooner.     Influenza Melyna Huron by PCR NEGATIVE NEGATIVE Final   Influenza B by PCR NEGATIVE NEGATIVE Final    Comment: (NOTE) The Xpert Xpress SARS-CoV-2/FLU/RSV plus assay is intended as an aid in the diagnosis of  influenza from Nasopharyngeal swab specimens and should not be used as [place] sole basis for treatment. Nasal washings and aspirates are unacceptable for Xpert Xpress SARS-CoV-2/FLU/RSV testing.  Fact Sheet for Patients: EntrepreneurPulse.com.au  Fact Sheet for Healthcare Providers: IncredibleEmployment.be  This test is not yet approved or cleared by the [place] FDA and has been authorized for detection and/or diagnosis of SARS-CoV-2 by FDA under an Emergency Use Authorization (EUA). This EUA will remain in effect (meaning this test can be used) for the duration of the COVID-19 declaration under Section 564(b)(1) of the Act, 21 U.S.C. section 360bbb-3(b)(1), unless the authorization is terminated or revoked.     Resp Syncytial Virus by PCR NEGATIVE NEGATIVE Final    Comment: (NOTE) Fact Sheet for Patients: EntrepreneurPulse.com.au  Fact Sheet for Healthcare Providers: IncredibleEmployment.be  This test is not yet approved or cleared by the [place] FDA and has been authorized for detection and/or diagnosis of SARS-CoV-2 by FDA under an Emergency Use Authorization (EUA). This EUA will remain in effect (meaning this test can be used) for the duration of the COVID-19 declaration under Section 564(b)(1) of the Act, 21 U.S.C. section 360bbb-3(b)(1), unless the authorization is terminated or revoked.  Performed at [facility], [place] [address] [place], [place] [address]          Radiology Studies: No results found.      Scheduled Meds:  arformoterol  15 mcg Nebulization BID   atorvastatin  80 mg Oral Daily   budesonide (PULMICORT) nebulizer solution  0.5 mg Nebulization BID   carvedilol  6.25 mg Oral BID WC   Chlorhexidine Gluconate Cloth  6 each Topical Q0600   clopidogrel  75 mg Oral Daily   dronabinol  2.5 mg Oral BID AC   escitalopram  10 mg Oral Daily   Gerhardt's butt cream    Topical BID   heparin injection (subcutaneous)  5,000 Units Subcutaneous Q8H   insulin aspart  0-6  Units Subcutaneous TID WC   insulin aspart  4 Units Subcutaneous TID WC   insulin glargine-yfgn  5 Units Subcutaneous BID   metroNIDAZOLE  500 mg Oral Q12H   mirtazapine  7.5 mg Oral QHS   multivitamin with minerals  1 tablet Oral Daily   nicotine  14 mg Transdermal Daily   oxyCODONE  10 mg Oral Q12H   pantoprazole  40 mg Oral Daily   polyethylene glycol  17 g Oral BID   revefenacin  175 mcg Nebulization Daily   senna-docusate  1 tablet Oral QHS   sodium chloride flush  10-40 mL Intracatheter Q12H   sodium chloride flush  10-40 mL Intracatheter Q12H   sodium chloride flush  10-40 mL Intracatheter Q12H   sodium hypochlorite   Irrigation Daily   Continuous Infusions:  ceFEPime (MAXIPIME) IV 2 g (09/22/22 0018)     LOS: 49 days    Time spent: over 30 min    Fayrene Helper, MD [facility]   To contact the attending provider between 7A-7P or the covering provider during after hours 7P-7A, please log into the web site [email or web address] and access using universal [place] password for that web site. If you do not have the password, please call the hospital operator.  09/22/2022, 9:36 AM

## 2022-09-22 NOTE — Consult Note (Signed)
[place] Nurse wound follow up NPWT bilateral groins  Wound type: bilateral groin wounds postop washout by vascular 09/16/2022  Wound bed:L groin 80% red 20% yellow fibrin  R groin 75% red 25% yellow  Drainage (amount, consistency, odor) minimal sanguinous  Periwound: intact  Dressing procedure/placement/frequency: Old NPWT had been removed by vascular earlier in day, wet to dry dressings in place upon my arrival.   Removed wet to dry dressings, cleaned wounds with NS.   L groin 1 piece white foam, 1 piece black foam  R groin 1 piece white foam, 1 piece black foam  Sealed NPWT dressing at 114m HG; cannister changed at this visit.   Patient tolerated procedure well   WDel Solnurse will continue to provide NPWT dressing changed due to the complexity of the dressing change. NPWT dressing next due to be changed Wednesday 09/24/2022.  Supplies in room and outside door due to contact precautions.   Thank you,     Rayden Dock MSN, RN-BC, [employer]

## 2022-09-23 ENCOUNTER — Ambulatory Visit: Payer: Medicare HMO | Attending: Cardiovascular Disease

## 2022-09-23 ENCOUNTER — Telehealth: Payer: Self-pay | Admitting: Physician Assistant

## 2022-09-23 ENCOUNTER — Encounter: Payer: Self-pay | Admitting: *Deleted

## 2022-09-23 DIAGNOSIS — I471 Supraventricular tachycardia, unspecified: Secondary | ICD-10-CM

## 2022-09-23 NOTE — Telephone Encounter (Signed)
-----  Message from [place], [place] sent at 09/22/2022  9:48 AM EST ----- S/p BLE revascularization, L AKA, and bilateral groin washout x2 with wound vac placement by Dr. Donzetta Matters. She will follow up bilateral groin wound check and VAC change and ABI in 2-3 weeks. Please arrange with Dr. Donzetta Matters or on PA schedule when Dr. Donzetta Matters is in the office. Thanks

## 2022-09-23 NOTE — Progress Notes (Unsigned)
ZIO XT monitor mailed to  [facility] in care of [facility] [address] [place], [place]  [address] ATTN: April  Letter with instructions mailed to same address.

## 2022-09-24 ENCOUNTER — Ambulatory Visit: Payer: Medicare HMO | Admitting: Infectious Diseases

## 2022-09-27 ENCOUNTER — Emergency Department (HOSPITAL_COMMUNITY): Payer: Medicare HMO

## 2022-09-27 ENCOUNTER — Other Ambulatory Visit: Payer: Self-pay

## 2022-09-27 ENCOUNTER — Encounter (HOSPITAL_COMMUNITY): Payer: Self-pay

## 2022-09-27 ENCOUNTER — Inpatient Hospital Stay (HOSPITAL_COMMUNITY)
Admission: EM | Admit: 2022-09-27 | Discharge: 2022-09-30 | DRG: 640 | Disposition: A | Discharge status: Skilled Nursing Facility | Payer: Medicare HMO | Source: Skilled Nursing Facility | Attending: Internal Medicine | Admitting: Internal Medicine

## 2022-09-27 DIAGNOSIS — E860 Dehydration: Principal | ICD-10-CM | POA: Diagnosis present

## 2022-09-27 DIAGNOSIS — I5032 Chronic diastolic (congestive) heart failure: Secondary | ICD-10-CM | POA: Diagnosis present

## 2022-09-27 DIAGNOSIS — Z8673 Personal history of transient ischemic attack (TIA), and cerebral infarction without residual deficits: Secondary | ICD-10-CM

## 2022-09-27 DIAGNOSIS — G9341 Metabolic encephalopathy: Secondary | ICD-10-CM | POA: Diagnosis present

## 2022-09-27 DIAGNOSIS — Z8616 Personal history of COVID-19: Secondary | ICD-10-CM

## 2022-09-27 DIAGNOSIS — Z7902 Long term (current) use of antithrombotics/antiplatelets: Secondary | ICD-10-CM

## 2022-09-27 DIAGNOSIS — G9340 Encephalopathy, unspecified: Secondary | ICD-10-CM | POA: Diagnosis present

## 2022-09-27 DIAGNOSIS — Z794 Long term (current) use of insulin: Secondary | ICD-10-CM

## 2022-09-27 DIAGNOSIS — I252 Old myocardial infarction: Secondary | ICD-10-CM

## 2022-09-27 DIAGNOSIS — Z955 Presence of coronary angioplasty implant and graft: Secondary | ICD-10-CM

## 2022-09-27 DIAGNOSIS — Z89612 Acquired absence of left leg above knee: Secondary | ICD-10-CM

## 2022-09-27 DIAGNOSIS — R4182 Altered mental status, unspecified: Principal | ICD-10-CM

## 2022-09-27 DIAGNOSIS — E1151 Type 2 diabetes mellitus with diabetic peripheral angiopathy without gangrene: Secondary | ICD-10-CM | POA: Diagnosis present

## 2022-09-27 DIAGNOSIS — Z818 Family history of other mental and behavioral disorders: Secondary | ICD-10-CM

## 2022-09-27 DIAGNOSIS — E785 Hyperlipidemia, unspecified: Secondary | ICD-10-CM | POA: Diagnosis present

## 2022-09-27 DIAGNOSIS — Z85828 Personal history of other malignant neoplasm of skin: Secondary | ICD-10-CM

## 2022-09-27 DIAGNOSIS — I2510 Atherosclerotic heart disease of native coronary artery without angina pectoris: Secondary | ICD-10-CM | POA: Diagnosis present

## 2022-09-27 DIAGNOSIS — E119 Type 2 diabetes mellitus without complications: Secondary | ICD-10-CM

## 2022-09-27 DIAGNOSIS — J449 Chronic obstructive pulmonary disease, unspecified: Secondary | ICD-10-CM | POA: Diagnosis present

## 2022-09-27 DIAGNOSIS — E1142 Type 2 diabetes mellitus with diabetic polyneuropathy: Secondary | ICD-10-CM | POA: Diagnosis present

## 2022-09-27 DIAGNOSIS — L89152 Pressure ulcer of sacral region, stage 2: Secondary | ICD-10-CM | POA: Diagnosis present

## 2022-09-27 DIAGNOSIS — I10 Essential (primary) hypertension: Secondary | ICD-10-CM | POA: Diagnosis present

## 2022-09-27 DIAGNOSIS — I110 Hypertensive heart disease with heart failure: Secondary | ICD-10-CM | POA: Diagnosis present

## 2022-09-27 DIAGNOSIS — F17210 Nicotine dependence, cigarettes, uncomplicated: Secondary | ICD-10-CM | POA: Diagnosis present

## 2022-09-27 DIAGNOSIS — F209 Schizophrenia, unspecified: Secondary | ICD-10-CM | POA: Diagnosis present

## 2022-09-27 DIAGNOSIS — K219 Gastro-esophageal reflux disease without esophagitis: Secondary | ICD-10-CM | POA: Diagnosis present

## 2022-09-27 DIAGNOSIS — S31109A Unspecified open wound of abdominal wall, unspecified quadrant without penetration into peritoneal cavity, initial encounter: Secondary | ICD-10-CM | POA: Diagnosis present

## 2022-09-27 DIAGNOSIS — Z953 Presence of xenogenic heart valve: Secondary | ICD-10-CM

## 2022-09-27 LAB — URINALYSIS, ROUTINE W REFLEX MICROSCOPIC
Bacteria, UA: NONE SEEN
Bilirubin Urine: NEGATIVE
Glucose, UA: 150 mg/dL — AB
Ketones, ur: NEGATIVE mg/dL
Leukocytes,Ua: NEGATIVE
Nitrite: NEGATIVE
Protein, ur: 100 mg/dL — AB
Specific Gravity, Urine: 1.017 (ref 1.005–1.030)
pH: 5 (ref 5.0–8.0)

## 2022-09-27 LAB — COMPREHENSIVE METABOLIC PANEL
ALT: 8 U/L (ref 0–44)
AST: 19 U/L (ref 15–41)
Albumin: 2.4 g/dL — ABNORMAL LOW (ref 3.5–5.0)
Alkaline Phosphatase: 67 U/L (ref 38–126)
Anion gap: 9 (ref 5–15)
BUN: 9 mg/dL (ref 8–23)
CO2: 23 mmol/L (ref 22–32)
Calcium: 9.1 mg/dL (ref 8.9–10.3)
Chloride: 103 mmol/L (ref 98–111)
Creatinine, Ser: 0.66 mg/dL (ref 0.44–1.00)
GFR, Estimated: >60 mL/min (ref >60)
Glucose, Bld: 246 mg/dL — ABNORMAL HIGH (ref 70–99)
Potassium: 3.9 mmol/L (ref 3.5–5.1)
Sodium: 135 mmol/L (ref 135–145)
Total Bilirubin: 0.4 mg/dL (ref 0.3–1.2)
Total Protein: 6.1 g/dL — ABNORMAL LOW (ref 6.5–8.1)

## 2022-09-27 LAB — CBC WITH DIFFERENTIAL/PLATELET
Abs Immature Granulocytes: 0.06 10*3/uL (ref 0.00–0.07)
Basophils Absolute: 0.1 10*3/uL (ref 0.0–0.1)
Basophils Relative: 1 %
Eosinophils Absolute: 0.4 10*3/uL (ref 0.0–0.5)
Eosinophils Relative: 6 %
HCT: 32.5 % — ABNORMAL LOW (ref 36.0–46.0)
Hemoglobin: 10.7 g/dL — ABNORMAL LOW (ref 12.0–15.0)
Immature Granulocytes: 1 %
Lymphocytes Relative: 18 %
Lymphs Abs: 1.1 10*3/uL (ref 0.7–4.0)
MCH: 31.4 pg (ref 26.0–34.0)
MCHC: 32.9 g/dL (ref 30.0–36.0)
MCV: 95.3 fL (ref 80.0–100.0)
Monocytes Absolute: 0.8 10*3/uL (ref 0.1–1.0)
Monocytes Relative: 12 %
Neutro Abs: 4 10*3/uL (ref 1.7–7.7)
Neutrophils Relative %: 62 %
Platelets: 503 10*3/uL — ABNORMAL HIGH (ref 150–400)
RBC: 3.41 MIL/uL — ABNORMAL LOW (ref 3.87–5.11)
RDW: 16.9 % — ABNORMAL HIGH (ref 11.5–15.5)
WBC: 6.4 10*3/uL (ref 4.0–10.5)
nRBC: 0 % (ref 0.0–0.2)

## 2022-09-27 LAB — CBG MONITORING, ED: Glucose-Capillary: 214 mg/dL — ABNORMAL HIGH (ref 70–99)

## 2022-09-27 LAB — LACTIC ACID, PLASMA: Lactic Acid, Venous: 0.9 mmol/L (ref 0.5–1.9)

## 2022-09-27 LAB — AMMONIA: Ammonia: 15 umol/L (ref 9–35)

## 2022-09-27 MED ORDER — ACETAMINOPHEN 325 MG PO TABS
650.0000 mg | ORAL_TABLET | Freq: Once | ORAL | Status: AC
  Administered 2022-09-27: 650 mg via ORAL
  Filled 2022-09-27: qty 2

## 2022-09-27 MED ORDER — METRONIDAZOLE 500 MG PO TABS
500.0000 mg | ORAL_TABLET | Freq: Two times a day (BID) | ORAL | Status: DC
  Administered 2022-09-27 – 2022-09-30 (×6): 500 mg via ORAL
  Filled 2022-09-27 (×6): qty 1

## 2022-09-27 MED ORDER — FENTANYL CITRATE PF 50 MCG/ML IJ SOSY
50.0000 ug | PREFILLED_SYRINGE | Freq: Once | INTRAMUSCULAR | Status: AC
  Administered 2022-09-27: 50 ug via INTRAVENOUS
  Filled 2022-09-27: qty 1

## 2022-09-27 MED ORDER — SODIUM CHLORIDE 0.9 % IV SOLN
2.0000 g | Freq: Two times a day (BID) | INTRAVENOUS | Status: DC
  Administered 2022-09-28 – 2022-09-30 (×5): 2 g via INTRAVENOUS
  Filled 2022-09-27 (×5): qty 12.5

## 2022-09-27 MED ORDER — LORAZEPAM 2 MG/ML IJ SOLN
0.5000 mg | Freq: Once | INTRAMUSCULAR | Status: AC | PRN
  Administered 2022-09-27: 0.5 mg via INTRAVENOUS
  Filled 2022-09-27: qty 1

## 2022-09-27 NOTE — ED Provider Notes (Incomplete)
[facility] Provider Note   CSN: [id number] Arrival date & time: 09/27/22  1631     History {Add pertinent medical, surgical, social history, OB history to HPI:1} Chief Complaint  Patient presents with  . Altered Mental Status    Heather [name] is a 72 y.o. female.  Patient presents to the emergency department via EMS with concerns over altered mental status.  The patient lives in a skilled nursing facility.  The patient's family reports going to visit the patient and finding her soaking wet, with a heat set at 85 degrees.  The patient has been sluggish to answer questions.  She is alert to person and place but not to event or time.  They states she seems more confused than his usual.  Past medical history includes intracerebral hemorrhage, admitted on December 11 and discharged on January 29, heart failure with preserved ejection fraction, coronary artery disease, chronic pain, COPD, diabetes mellitus with most recent A1c being greater than 12, cervical degenerative disc disease, memory loss, hypertension  HPI     Home Medications Prior to Admission medications   Medication Sig Start Date End Date Taking? Authorizing Provider  albuterol (VENTOLIN HFA) 108 (90 Base) MCG/ACT inhaler Inhale 2 puffs into the lungs every 4 (four) hours as needed for wheezing or shortness of breath. 03/09/14   [provider]  atorvastatin (LIPITOR) 80 MG tablet Take 80 mg by mouth at bedtime. 08/28/18   [provider]  carvedilol (COREG) 6.25 MG tablet Take 1 tablet (6.25 mg total) by mouth 2 (two) times daily with a meal. 09/22/22 10/22/22  Elodia Florence., MD  ceFEPime (MAXIPIME) IVPB Inject 2 g into the vein every 8 (eight) hours. Indication:  infected graft  First Dose: Yes Last Day of Therapy:  10/28/22 Labs - Once weekly:  CBC/D and BMP, Labs - Every other week:  ESR and CRP Method of administration: IV Push Remove PICC line at completion  of antibiotic therapy Method of administration may be changed at the discretion of home infusion pharmacist based upon assessment of the patient and/or caregiver's ability to self-administer the medication ordered. 09/22/22 11/01/22  Elodia Florence., MD  cetirizine (ZYRTEC) 10 MG tablet Take 10 mg by mouth daily. 07/07/22   [provider]  cholecalciferol (VITAMIN D) 1000 units tablet Take 1,000 Units by mouth daily.    [provider]  clopidogrel (PLAVIX) 75 MG tablet Take 1 tablet (75 mg total) by mouth daily. 11/15/20   Regalado, Belkys A, MD  dronabinol (MARINOL) 2.5 MG capsule Take 1 capsule (2.5 mg total) by mouth 2 (two) times daily before lunch and supper. 09/22/22 10/22/22  Elodia Florence., MD  escitalopram (LEXAPRO) 10 MG tablet Take 1 tablet (10 mg total) by mouth daily. 09/22/22 10/22/22  Elodia Florence., MD  fluticasone (FLONASE) 50 MCG/ACT nasal spray Place 1 spray into both nostrils daily as needed for allergies. 11/02/16   [provider]  Fluticasone-Salmeterol (ADVAIR) 250-50 MCG/DOSE AEPB Inhale 1 puff into the lungs daily. 10/02/16   [provider]  insulin aspart (NOVOLOG) 100 UNIT/ML injection Inject 3 Units into the skin 3 (three) times daily with meals. Hold for NPO or consuming less than 50% of meals 09/22/22   Elodia Florence., MD  insulin glargine-yfgn [facility]) 100 UNIT/ML injection Inject 0.05 mLs (5 Units total) into the skin 2 (two) times daily. 09/22/22   Elodia Florence., MD  metroNIDAZOLE (FLAGYL) 500 MG tablet Take 1 tablet (500 mg total) by mouth every 12 (twelve) hours. 09/22/22 10/28/22  Elodia Florence., MD  mirtazapine (REMERON) 7.5 MG tablet Take 1 tablet (7.5 mg total) by mouth at bedtime. 09/22/22 10/22/22  Elodia Florence., MD  Multiple Vitamins-Minerals (WOMENS MULTI VITAMIN & MINERAL PO) Take 1 tablet by mouth every morning.    [provider]  oxyCODONE (OXY IR/ROXICODONE) 5 MG  immediate release tablet Take 1 tablet (5 mg total) by mouth every 6 (six) hours as needed for up to 5 days for moderate pain. 09/22/22 09/27/22  Elodia Florence., MD  pantoprazole (PROTONIX) 40 MG tablet Take 1 tablet (40 mg total) by mouth daily. 09/22/22 10/22/22  Elodia Florence., MD  polyethylene glycol (MIRALAX / GLYCOLAX) 17 g packet Take 17 g by mouth daily. Titrate as needed for constipation. 09/22/22   Elodia Florence., MD  traZODone (DESYREL) 50 MG tablet Take 0.5 tablets (25 mg total) by mouth at bedtime as needed for sleep. 09/22/22 10/22/22  Elodia Florence., MD      Allergies    Iodinated contrast media, Sulfa antibiotics, and Shellfish allergy    Review of Systems   Review of Systems  Reason unable to perform ROS: Patient answers no to most questions, seems unreliable as historian.  Constitutional:  Negative for fever.  Respiratory:  Negative for cough and shortness of breath.   Cardiovascular:  Negative for chest pain.  Gastrointestinal:  Negative for abdominal pain and vomiting.  Neurological:  Negative for syncope.  Psychiatric/Behavioral:  Positive for confusion.     Physical Exam Updated Vital Signs BP (!) 184/73   Pulse 79   Temp 98 F (36.7 C) (Oral)   Resp 20   Ht 5' (1.524 m)   Wt 55.4 kg   SpO2 97%   BMI 23.85 kg/m  Physical Exam Vitals and nursing note reviewed.  HENT:     Head: Normocephalic and atraumatic.     Nose: Nose normal.     Mouth/Throat:     Mouth: Mucous membranes are moist.  Eyes:     Extraocular Movements: Extraocular movements intact.     Conjunctiva/sclera: Conjunctivae normal.     Pupils: Pupils are equal, round, and reactive to light.  Cardiovascular:     Rate and Rhythm: Normal rate and regular rhythm.     Pulses: Normal pulses.  Pulmonary:     Effort: Pulmonary effort is normal.     Breath sounds: Normal breath sounds.  Abdominal:     Palpations: Abdomen is soft.     Tenderness: There is no abdominal  tenderness.  Musculoskeletal:     Cervical back: Normal range of motion and neck supple.     Comments: Left AKA, bilateral groin wounds  Skin:    General: Skin is warm and dry.  Neurological:     Mental Status: She is alert. She is disoriented.     Comments: Patient unable to fully cooperate with neuroexam     ED Results / Procedures / Treatments   Labs (all labs ordered are listed, but only abnormal results are displayed) Labs Reviewed  CBC WITH DIFFERENTIAL/PLATELET  CBC WITH DIFFERENTIAL/PLATELET  COMPREHENSIVE METABOLIC PANEL  URINALYSIS, ROUTINE W REFLEX MICROSCOPIC  AMMONIA  LACTIC ACID, PLASMA  CBG MONITORING, ED    EKG None  Radiology No results found.  Procedures Procedures  {Document cardiac monitor, telemetry assessment procedure when appropriate:1}  Medications Ordered  in ED Medications - No data to display  ED Course/ Medical Decision Making/ A&P   {   Click here for ABCD2, HEART and other calculatorsREFRESH Note before signing :1}                          Medical Decision Making Amount and/or Complexity of Data Reviewed Labs: ordered. Radiology: ordered.  Risk OTC drugs. Prescription drug management.   This patient presents to the ED for concern of altered mental status, this involves an extensive number of treatment options, and is a complaint that carries with it a high risk of complications and morbidity.  The differential diagnosis includes CVA, delirium, infection, dysrhythmia, others   Co morbidities that complicate the patient evaluation  Intracerebral hemorrhage, memory loss, bilateral groin wounds, COPD, heart failure   Additional history obtained:  Additional history obtained from EMS External records from outside source obtained and reviewed including notes from Recent hospital admission for intracerebral hemorrhage, discharged on January 29.  Bilateral groin wounds currently being followed by ID.  PICC line in place for IV  antibiotics.   Lab Tests:  I Ordered, and personally interpreted labs.  The pertinent results include: Hemoglobin 10.7 (improved from hospital admission), unremarkable CMP, unremarkable urinalysis, ammonia 15, lactic acid 0.9, capillary glucose 214   Imaging Studies ordered:  I ordered imaging studies including CT head and chest x-ray I independently visualized and interpreted imaging which showed  No evidence of acute hemorrhage.    Small areas of hypoattenuation in the right cerebellum with  uncertain significance. These may represent age-indeterminate  infarcts.    Moderate brain parenchymal volume loss and deep white matter  microangiopathy  Chest x-ray with no acute process Due to CT findings and continued altered mental status MRI brain without contrast was ordered.  No acute intracranial abnormality was noted. I agree with the radiologist interpretation   Cardiac Monitoring: / EKG:  The patient was maintained on a cardiac monitor.  I personally viewed and interpreted the cardiac monitored which showed an underlying rhythm of: Sinus rhythm   Consultations Obtained:  I requested consultation with the hospitalist,  and discussed lab and imaging findings as well as pertinent plan - they recommend: ***   Problem List / ED Course / Critical interventions / Medication management   I ordered medication including Tylenol, fentanyl for headache.  Ativan for MRI anxiety.  Flagyl and cefepime for continued antibiotic coverage. Reevaluation of the patient after these medicines showed that the patient improved I have reviewed the patients home medicines and have made adjustments as needed   Social Determinants of Health:  Patient currently resides in skilled nursing facility   Test / Admission - Considered:  Patient continues to be altered.  No acute findings on MRI to suggest CVA/TIA.  Patient currently alert to person only.  I spoke with the patient's daughter over the  phone who states the patient is normally alert to person place event and time.  Unclear etiology.  Consider possible delirium with patient having been hospitalized for a month and a half.  Also consider possible encephalopathy.  Patient will need admission for further evaluation and management.   {Document critical care time when appropriate:1} {Document review of labs and clinical decision tools ie heart score, Chads2Vasc2 etc:1}  {Document your independent review of radiology images, and any outside records:1} {Document your discussion with family members, caretakers, and with consultants:1} {Document social determinants of health affecting pt's care:1} {Document  your decision making why or why not admission, treatments were needed:1} Final Clinical Impression(s) / ED Diagnoses Final diagnoses:  None    Rx / DC Orders ED Discharge Orders     None

## 2022-09-27 NOTE — ED Notes (Signed)
Patient to MRI.

## 2022-09-27 NOTE — ED Provider Notes (Signed)
[facility] Provider Note   CSN: [id number] Arrival date & time: 09/27/22  1631     History {Add pertinent medical, surgical, social history, OB history to HPI:1} Chief Complaint  Patient presents with   Altered Mental Status    Heather [name] is a 72 y.o. female.  Patient presents to the emergency department via EMS with concerns over altered mental status.  The patient lives in a skilled nursing facility.  The patient's family reports going to visit the patient and finding her soaking wet, with a heat set at 85 degrees.  The patient has been sluggish to answer questions.  She is alert to person and place but not to event or time.  They states she seems more confused than his usual.  Past medical history includes intracerebral hemorrhage, admitted on December 11 and discharged on January 29, heart failure with preserved ejection fraction, coronary artery disease, chronic pain, COPD, diabetes mellitus with most recent A1c being greater than 12, cervical degenerative disc disease, memory loss, hypertension  HPI     Home Medications Prior to Admission medications   Medication Sig Start Date End Date Taking? Authorizing Provider  albuterol (VENTOLIN HFA) 108 (90 Base) MCG/ACT inhaler Inhale 2 puffs into the lungs every 4 (four) hours as needed for wheezing or shortness of breath. 03/09/14   [provider]  atorvastatin (LIPITOR) 80 MG tablet Take 80 mg by mouth at bedtime. 08/28/18   [provider]  carvedilol (COREG) 6.25 MG tablet Take 1 tablet (6.25 mg total) by mouth 2 (two) times daily with a meal. 09/22/22 10/22/22  Elodia Florence., MD  ceFEPime (MAXIPIME) IVPB Inject 2 g into the vein every 8 (eight) hours. Indication:  infected graft  First Dose: Yes Last Day of Therapy:  10/28/22 Labs - Once weekly:  CBC/D and BMP, Labs - Every other week:  ESR and CRP Method of administration: IV Push Remove PICC line at completion  of antibiotic therapy Method of administration may be changed at the discretion of home infusion pharmacist based upon assessment of the patient and/or caregiver's ability to self-administer the medication ordered. 09/22/22 11/01/22  Elodia Florence., MD  cetirizine (ZYRTEC) 10 MG tablet Take 10 mg by mouth daily. 07/07/22   [provider]  cholecalciferol (VITAMIN D) 1000 units tablet Take 1,000 Units by mouth daily.    [provider]  clopidogrel (PLAVIX) 75 MG tablet Take 1 tablet (75 mg total) by mouth daily. 11/15/20   Regalado, Belkys A, MD  dronabinol (MARINOL) 2.5 MG capsule Take 1 capsule (2.5 mg total) by mouth 2 (two) times daily before lunch and supper. 09/22/22 10/22/22  Elodia Florence., MD  escitalopram (LEXAPRO) 10 MG tablet Take 1 tablet (10 mg total) by mouth daily. 09/22/22 10/22/22  Elodia Florence., MD  fluticasone (FLONASE) 50 MCG/ACT nasal spray Place 1 spray into both nostrils daily as needed for allergies. 11/02/16   [provider]  Fluticasone-Salmeterol (ADVAIR) 250-50 MCG/DOSE AEPB Inhale 1 puff into the lungs daily. 10/02/16   [provider]  insulin aspart (NOVOLOG) 100 UNIT/ML injection Inject 3 Units into the skin 3 (three) times daily with meals. Hold for NPO or consuming less than 50% of meals 09/22/22   Elodia Florence., MD  insulin glargine-yfgn [facility]) 100 UNIT/ML injection Inject 0.05 mLs (5 Units total) into the skin 2 (two) times daily. 09/22/22   Elodia Florence., MD  metroNIDAZOLE (FLAGYL) 500 MG tablet Take 1 tablet (500 mg total) by mouth every 12 (twelve) hours. 09/22/22 10/28/22  Elodia Florence., MD  mirtazapine (REMERON) 7.5 MG tablet Take 1 tablet (7.5 mg total) by mouth at bedtime. 09/22/22 10/22/22  Elodia Florence., MD  Multiple Vitamins-Minerals (WOMENS MULTI VITAMIN & MINERAL PO) Take 1 tablet by mouth every morning.    [provider]  oxyCODONE (OXY IR/ROXICODONE) 5 MG  immediate release tablet Take 1 tablet (5 mg total) by mouth every 6 (six) hours as needed for up to 5 days for moderate pain. 09/22/22 09/27/22  Elodia Florence., MD  pantoprazole (PROTONIX) 40 MG tablet Take 1 tablet (40 mg total) by mouth daily. 09/22/22 10/22/22  Elodia Florence., MD  polyethylene glycol (MIRALAX / GLYCOLAX) 17 g packet Take 17 g by mouth daily. Titrate as needed for constipation. 09/22/22   Elodia Florence., MD  traZODone (DESYREL) 50 MG tablet Take 0.5 tablets (25 mg total) by mouth at bedtime as needed for sleep. 09/22/22 10/22/22  Elodia Florence., MD      Allergies    Iodinated contrast media, Sulfa antibiotics, and Shellfish allergy    Review of Systems   Review of Systems  Reason unable to perform ROS: Patient answers no to most questions, seems unreliable as historian.  Constitutional:  Negative for fever.  Respiratory:  Negative for cough and shortness of breath.   Cardiovascular:  Negative for chest pain.  Gastrointestinal:  Negative for abdominal pain and vomiting.  Neurological:  Negative for syncope.  Psychiatric/Behavioral:  Positive for confusion.     Physical Exam Updated Vital Signs BP (!) 184/73   Pulse 79   Temp 98 F (36.7 C) (Oral)   Resp 20   Ht 5' (1.524 m)   Wt 55.4 kg   SpO2 97%   BMI 23.85 kg/m  Physical Exam Vitals and nursing note reviewed.  HENT:     Head: Normocephalic and atraumatic.     Nose: Nose normal.     Mouth/Throat:     Mouth: Mucous membranes are moist.  Eyes:     Extraocular Movements: Extraocular movements intact.     Conjunctiva/sclera: Conjunctivae normal.     Pupils: Pupils are equal, round, and reactive to light.  Cardiovascular:     Rate and Rhythm: Normal rate and regular rhythm.     Pulses: Normal pulses.  Pulmonary:     Effort: Pulmonary effort is normal.     Breath sounds: Normal breath sounds.  Abdominal:     Palpations: Abdomen is soft.     Tenderness: There is no abdominal  tenderness.  Musculoskeletal:     Cervical back: Normal range of motion and neck supple.     Comments: Left AKA, bilateral groin wounds  Skin:    General: Skin is warm and dry.  Neurological:     Mental Status: She is alert. She is disoriented.     Comments: Patient unable to fully cooperate with neuroexam     ED Results / Procedures / Treatments   Labs (all labs ordered are listed, but only abnormal results are displayed) Labs Reviewed  CBC WITH DIFFERENTIAL/PLATELET  CBC WITH DIFFERENTIAL/PLATELET  COMPREHENSIVE METABOLIC PANEL  URINALYSIS, ROUTINE W REFLEX MICROSCOPIC  AMMONIA  LACTIC ACID, PLASMA  CBG MONITORING, ED    EKG None  Radiology No results found.  Procedures Procedures  {Document cardiac monitor, telemetry assessment procedure when appropriate:1}  Medications Ordered  in ED Medications - No data to display  ED Course/ Medical Decision Making/ A&P   {   Click here for ABCD2, HEART and other calculatorsREFRESH Note before signing :1}                          Medical Decision Making Amount and/or Complexity of Data Reviewed Labs: ordered. Radiology: ordered.  Risk OTC drugs.   This patient presents to the ED for concern of altered mental status, this involves an extensive number of treatment options, and is a complaint that carries with it a high risk of complications and morbidity.  The differential diagnosis includes CVA, delirium, infection, dysrhythmia, others   Co morbidities that complicate the patient evaluation  Intracerebral hemorrhage, memory loss, bilateral groin wounds, COPD, heart failure   Additional history obtained:  Additional history obtained from EMS External records from outside source obtained and reviewed including notes from Recent hospital admission for intracerebral hemorrhage, discharged on January 29.  Bilateral groin wounds currently being followed by ID.  PICC line in place for IV antibiotics.   Lab Tests:  I  Ordered, and personally interpreted labs.  The pertinent results include: Hemoglobin 10.7 (improved from hospital admission), unremarkable CMP, unremarkable urinalysis, ammonia 15, lactic acid 0.9, capillary glucose 214   Imaging Studies ordered:  I ordered imaging studies including CT head and chest x-ray I independently visualized and interpreted imaging which showed *** I agree with the radiologist interpretation   Cardiac Monitoring: / EKG:  The patient was maintained on a cardiac monitor.  I personally viewed and interpreted the cardiac monitored which showed an underlying rhythm of: Sinus rhythm   Consultations Obtained:  I requested consultation with the ***,  and discussed lab and imaging findings as well as pertinent plan - they recommend: ***   Problem List / ED Course / Critical interventions / Medication management   I ordered medication including Tylenol for headache Reevaluation of the patient after these medicines showed that the patient improved I have reviewed the patients home medicines and have made adjustments as needed   Social Determinants of Health:  Patient currently resides in skilled nursing facility   Test / Admission - Considered:  ***   {Document critical care time when appropriate:1} {Document review of labs and clinical decision tools ie heart score, Chads2Vasc2 etc:1}  {Document your independent review of radiology images, and any outside records:1} {Document your discussion with family members, caretakers, and with consultants:1} {Document social determinants of health affecting pt's care:1} {Document your decision making why or why not admission, treatments were needed:1} Final Clinical Impression(s) / ED Diagnoses Final diagnoses:  None    Rx / DC Orders ED Discharge Orders     None

## 2022-09-27 NOTE — ED Notes (Signed)
Pt was incontinent of urine. Cleaned pt and applied a clean brief.

## 2022-09-27 NOTE — ED Notes (Signed)
Transported to MRI

## 2022-09-27 NOTE — ED Notes (Signed)
Patient transported to CT 

## 2022-09-27 NOTE — ED Triage Notes (Signed)
Pt arrived via [facility] from [facility] for AMS and slow to respond. Per EMS, pt has pressure ulcers on buttocks. Pt is A&Ox2 to self and place.

## 2022-09-28 ENCOUNTER — Encounter (HOSPITAL_COMMUNITY): Payer: Self-pay | Admitting: Internal Medicine

## 2022-09-28 ENCOUNTER — Observation Stay (HOSPITAL_COMMUNITY): Payer: Medicare HMO

## 2022-09-28 DIAGNOSIS — I5032 Chronic diastolic (congestive) heart failure: Secondary | ICD-10-CM | POA: Diagnosis present

## 2022-09-28 DIAGNOSIS — S31109D Unspecified open wound of abdominal wall, unspecified quadrant without penetration into peritoneal cavity, subsequent encounter: Secondary | ICD-10-CM

## 2022-09-28 DIAGNOSIS — Z794 Long term (current) use of insulin: Secondary | ICD-10-CM

## 2022-09-28 DIAGNOSIS — L89152 Pressure ulcer of sacral region, stage 2: Secondary | ICD-10-CM | POA: Diagnosis present

## 2022-09-28 DIAGNOSIS — I2510 Atherosclerotic heart disease of native coronary artery without angina pectoris: Secondary | ICD-10-CM | POA: Diagnosis present

## 2022-09-28 DIAGNOSIS — Z8616 Personal history of COVID-19: Secondary | ICD-10-CM | POA: Diagnosis not present

## 2022-09-28 DIAGNOSIS — E860 Dehydration: Secondary | ICD-10-CM | POA: Diagnosis present

## 2022-09-28 DIAGNOSIS — Z7902 Long term (current) use of antithrombotics/antiplatelets: Secondary | ICD-10-CM | POA: Diagnosis not present

## 2022-09-28 DIAGNOSIS — Z8673 Personal history of transient ischemic attack (TIA), and cerebral infarction without residual deficits: Secondary | ICD-10-CM | POA: Diagnosis not present

## 2022-09-28 DIAGNOSIS — E1151 Type 2 diabetes mellitus with diabetic peripheral angiopathy without gangrene: Secondary | ICD-10-CM | POA: Diagnosis present

## 2022-09-28 DIAGNOSIS — E785 Hyperlipidemia, unspecified: Secondary | ICD-10-CM | POA: Diagnosis present

## 2022-09-28 DIAGNOSIS — R4182 Altered mental status, unspecified: Secondary | ICD-10-CM

## 2022-09-28 DIAGNOSIS — K219 Gastro-esophageal reflux disease without esophagitis: Secondary | ICD-10-CM | POA: Diagnosis present

## 2022-09-28 DIAGNOSIS — Z89612 Acquired absence of left leg above knee: Secondary | ICD-10-CM | POA: Diagnosis not present

## 2022-09-28 DIAGNOSIS — G9340 Encephalopathy, unspecified: Secondary | ICD-10-CM | POA: Diagnosis not present

## 2022-09-28 DIAGNOSIS — Z955 Presence of coronary angioplasty implant and graft: Secondary | ICD-10-CM | POA: Diagnosis not present

## 2022-09-28 DIAGNOSIS — F209 Schizophrenia, unspecified: Secondary | ICD-10-CM | POA: Diagnosis present

## 2022-09-28 DIAGNOSIS — E119 Type 2 diabetes mellitus without complications: Secondary | ICD-10-CM

## 2022-09-28 DIAGNOSIS — G9341 Metabolic encephalopathy: Secondary | ICD-10-CM | POA: Diagnosis present

## 2022-09-28 DIAGNOSIS — Z85828 Personal history of other malignant neoplasm of skin: Secondary | ICD-10-CM | POA: Diagnosis not present

## 2022-09-28 DIAGNOSIS — Z818 Family history of other mental and behavioral disorders: Secondary | ICD-10-CM | POA: Diagnosis not present

## 2022-09-28 DIAGNOSIS — J449 Chronic obstructive pulmonary disease, unspecified: Secondary | ICD-10-CM | POA: Diagnosis present

## 2022-09-28 DIAGNOSIS — I252 Old myocardial infarction: Secondary | ICD-10-CM | POA: Diagnosis not present

## 2022-09-28 DIAGNOSIS — I110 Hypertensive heart disease with heart failure: Secondary | ICD-10-CM | POA: Diagnosis present

## 2022-09-28 DIAGNOSIS — F17210 Nicotine dependence, cigarettes, uncomplicated: Secondary | ICD-10-CM | POA: Diagnosis present

## 2022-09-28 DIAGNOSIS — I10 Essential (primary) hypertension: Secondary | ICD-10-CM

## 2022-09-28 DIAGNOSIS — Z953 Presence of xenogenic heart valve: Secondary | ICD-10-CM | POA: Diagnosis not present

## 2022-09-28 DIAGNOSIS — E1142 Type 2 diabetes mellitus with diabetic polyneuropathy: Secondary | ICD-10-CM | POA: Diagnosis present

## 2022-09-28 LAB — CBC
HCT: 33.4 % — ABNORMAL LOW (ref 36.0–46.0)
Hemoglobin: 10.7 g/dL — ABNORMAL LOW (ref 12.0–15.0)
MCH: 30.3 pg (ref 26.0–34.0)
MCHC: 32 g/dL (ref 30.0–36.0)
MCV: 94.6 fL (ref 80.0–100.0)
Platelets: 483 10*3/uL — ABNORMAL HIGH (ref 150–400)
RBC: 3.53 MIL/uL — ABNORMAL LOW (ref 3.87–5.11)
RDW: 16.9 % — ABNORMAL HIGH (ref 11.5–15.5)
WBC: 7.1 10*3/uL (ref 4.0–10.5)
nRBC: 0 % (ref 0.0–0.2)

## 2022-09-28 LAB — COMPREHENSIVE METABOLIC PANEL
ALT: 8 U/L (ref 0–44)
AST: 16 U/L (ref 15–41)
Albumin: 2.5 g/dL — ABNORMAL LOW (ref 3.5–5.0)
Alkaline Phosphatase: 61 U/L (ref 38–126)
Anion gap: 9 (ref 5–15)
BUN: 9 mg/dL (ref 8–23)
CO2: 23 mmol/L (ref 22–32)
Calcium: 9.2 mg/dL (ref 8.9–10.3)
Chloride: 103 mmol/L (ref 98–111)
Creatinine, Ser: 0.65 mg/dL (ref 0.44–1.00)
GFR, Estimated: >60 mL/min (ref >60)
Glucose, Bld: 178 mg/dL — ABNORMAL HIGH (ref 70–99)
Potassium: 3.5 mmol/L (ref 3.5–5.1)
Sodium: 135 mmol/L (ref 135–145)
Total Bilirubin: 0.4 mg/dL (ref 0.3–1.2)
Total Protein: 6.1 g/dL — ABNORMAL LOW (ref 6.5–8.1)

## 2022-09-28 LAB — GLUCOSE, CAPILLARY
Glucose-Capillary: 186 mg/dL — ABNORMAL HIGH (ref 70–99)
Glucose-Capillary: 209 mg/dL — ABNORMAL HIGH (ref 70–99)
Glucose-Capillary: 286 mg/dL — ABNORMAL HIGH (ref 70–99)
Glucose-Capillary: 321 mg/dL — ABNORMAL HIGH (ref 70–99)

## 2022-09-28 LAB — TSH: TSH: 1.258 u[IU]/mL (ref 0.350–4.500)

## 2022-09-28 LAB — CBG MONITORING, ED: Glucose-Capillary: 157 mg/dL — ABNORMAL HIGH (ref 70–99)

## 2022-09-28 LAB — HIV ANTIBODY (ROUTINE TESTING W REFLEX): HIV Screen 4th Generation wRfx: NONREACTIVE

## 2022-09-28 LAB — MRSA NEXT GEN BY PCR, NASAL: MRSA by PCR Next Gen: NOT DETECTED

## 2022-09-28 LAB — VITAMIN B12: Vitamin B-12: 639 pg/mL (ref 180–914)

## 2022-09-28 LAB — RPR: RPR Ser Ql: NONREACTIVE

## 2022-09-28 MED ORDER — ACETAMINOPHEN 650 MG RE SUPP: 650.0000 mg | Freq: Four times a day (QID) | RECTAL | Status: DC | PRN

## 2022-09-28 MED ORDER — ONDANSETRON HCL 4 MG PO TABS: 4.0000 mg | ORAL_TABLET | Freq: Four times a day (QID) | ORAL | Status: DC | PRN

## 2022-09-28 MED ORDER — ACETAMINOPHEN 325 MG PO TABS
650.0000 mg | ORAL_TABLET | Freq: Four times a day (QID) | ORAL | Status: DC | PRN
  Administered 2022-09-28 – 2022-09-29 (×3): 650 mg via ORAL
  Filled 2022-09-28 (×2): qty 2

## 2022-09-28 MED ORDER — MIRTAZAPINE 15 MG PO TABS
7.5000 mg | ORAL_TABLET | Freq: Every day | ORAL | Status: DC
  Administered 2022-09-28 – 2022-09-29 (×3): 7.5 mg via ORAL
  Filled 2022-09-28 (×3): qty 1

## 2022-09-28 MED ORDER — FENTANYL CITRATE PF 50 MCG/ML IJ SOSY
25.0000 ug | PREFILLED_SYRINGE | INTRAMUSCULAR | Status: DC | PRN
  Administered 2022-09-28 (×4): 25 ug via INTRAVENOUS
  Filled 2022-09-28 (×4): qty 1

## 2022-09-28 MED ORDER — ATORVASTATIN CALCIUM 80 MG PO TABS
80.0000 mg | ORAL_TABLET | Freq: Every day | ORAL | Status: DC
  Administered 2022-09-28 – 2022-09-29 (×2): 80 mg via ORAL
  Filled 2022-09-28 (×3): qty 1

## 2022-09-28 MED ORDER — ALBUTEROL SULFATE (2.5 MG/3ML) 0.083% IN NEBU: 3.0000 mL | INHALATION_SOLUTION | RESPIRATORY_TRACT | Status: DC | PRN

## 2022-09-28 MED ORDER — TRAMADOL HCL 50 MG PO TABS
50.0000 mg | ORAL_TABLET | Freq: Four times a day (QID) | ORAL | Status: DC | PRN
  Administered 2022-09-28 – 2022-09-30 (×4): 50 mg via ORAL
  Filled 2022-09-28 (×4): qty 1

## 2022-09-28 MED ORDER — PANTOPRAZOLE SODIUM 40 MG PO TBEC
40.0000 mg | DELAYED_RELEASE_TABLET | Freq: Every day | ORAL | Status: DC
  Administered 2022-09-28 – 2022-09-30 (×3): 40 mg via ORAL
  Filled 2022-09-28 (×3): qty 1

## 2022-09-28 MED ORDER — FLUTICASONE PROPIONATE 50 MCG/ACT NA SUSP: 1.0000 | Freq: Every day | NASAL | Status: DC | PRN

## 2022-09-28 MED ORDER — ONDANSETRON HCL 4 MG/2ML IJ SOLN: 4.0000 mg | Freq: Four times a day (QID) | INTRAMUSCULAR | Status: DC | PRN

## 2022-09-28 MED ORDER — POTASSIUM CHLORIDE CRYS ER 20 MEQ PO TBCR
40.0000 meq | EXTENDED_RELEASE_TABLET | Freq: Once | ORAL | Status: AC
  Administered 2022-09-28: 40 meq via ORAL
  Filled 2022-09-28: qty 2

## 2022-09-28 MED ORDER — ESCITALOPRAM OXALATE 10 MG PO TABS
10.0000 mg | ORAL_TABLET | Freq: Every day | ORAL | Status: DC
  Administered 2022-09-28 – 2022-09-30 (×3): 10 mg via ORAL
  Filled 2022-09-28 (×3): qty 1

## 2022-09-28 MED ORDER — CARVEDILOL 6.25 MG PO TABS
6.2500 mg | ORAL_TABLET | Freq: Two times a day (BID) | ORAL | Status: DC
  Administered 2022-09-28 – 2022-09-30 (×5): 6.25 mg via ORAL
  Filled 2022-09-28 (×5): qty 1

## 2022-09-28 MED ORDER — TRAZODONE HCL 50 MG PO TABS
25.0000 mg | ORAL_TABLET | Freq: Every evening | ORAL | Status: DC | PRN
  Administered 2022-09-28 – 2022-09-29 (×2): 25 mg via ORAL
  Filled 2022-09-28 (×2): qty 1

## 2022-09-28 MED ORDER — MOMETASONE FURO-FORMOTEROL FUM 200-5 MCG/ACT IN AERO
2.0000 | INHALATION_SPRAY | Freq: Two times a day (BID) | RESPIRATORY_TRACT | Status: DC
  Administered 2022-09-29 – 2022-09-30 (×4): 2 via RESPIRATORY_TRACT
  Filled 2022-09-28: qty 8.8

## 2022-09-28 MED ORDER — HYDRALAZINE HCL 20 MG/ML IJ SOLN: 10.0000 mg | Freq: Four times a day (QID) | INTRAMUSCULAR | Status: DC | PRN

## 2022-09-28 MED ORDER — POLYETHYLENE GLYCOL 3350 17 G PO PACK
17.0000 g | PACK | Freq: Every day | ORAL | Status: DC
  Administered 2022-09-28: 17 g via ORAL
  Filled 2022-09-28: qty 1

## 2022-09-28 MED ORDER — FENTANYL CITRATE PF 50 MCG/ML IJ SOSY
25.0000 ug | PREFILLED_SYRINGE | Freq: Once | INTRAMUSCULAR | Status: AC
  Administered 2022-09-28: 25 ug via INTRAVENOUS
  Filled 2022-09-28: qty 1

## 2022-09-28 MED ORDER — SODIUM CHLORIDE 0.9% FLUSH: 10.0000 mL | INTRAVENOUS | Status: DC | PRN

## 2022-09-28 MED ORDER — INSULIN GLARGINE-YFGN 100 UNIT/ML ~~LOC~~ SOLN
5.0000 [IU] | Freq: Two times a day (BID) | SUBCUTANEOUS | Status: DC
  Administered 2022-09-28 (×3): 5 [IU] via SUBCUTANEOUS
  Filled 2022-09-28 (×6): qty 0.05

## 2022-09-28 MED ORDER — CLOPIDOGREL BISULFATE 75 MG PO TABS
75.0000 mg | ORAL_TABLET | Freq: Every day | ORAL | Status: DC
  Administered 2022-09-28 – 2022-09-30 (×3): 75 mg via ORAL
  Filled 2022-09-28 (×3): qty 1

## 2022-09-28 MED ORDER — CHLORHEXIDINE GLUCONATE CLOTH 2 % EX PADS
6.0000 | MEDICATED_PAD | Freq: Every day | CUTANEOUS | Status: DC
  Administered 2022-09-28 – 2022-09-30 (×3): 6 via TOPICAL

## 2022-09-28 MED ORDER — HYDRALAZINE HCL 50 MG PO TABS
50.0000 mg | ORAL_TABLET | Freq: Three times a day (TID) | ORAL | Status: DC
  Administered 2022-09-28 – 2022-09-29 (×3): 50 mg via ORAL
  Filled 2022-09-28 (×5): qty 1

## 2022-09-28 MED ORDER — INSULIN ASPART 100 UNIT/ML IJ SOLN
3.0000 [IU] | Freq: Three times a day (TID) | INTRAMUSCULAR | Status: DC
  Administered 2022-09-28 – 2022-09-30 (×8): 3 [IU] via SUBCUTANEOUS

## 2022-09-28 MED ORDER — ADULT MULTIVITAMIN W/MINERALS CH
1.0000 | ORAL_TABLET | Freq: Every day | ORAL | Status: DC
  Administered 2022-09-29 – 2022-09-30 (×2): 1 via ORAL
  Filled 2022-09-28 (×3): qty 1

## 2022-09-28 MED ORDER — AMLODIPINE BESYLATE 10 MG PO TABS
10.0000 mg | ORAL_TABLET | Freq: Every day | ORAL | Status: DC
  Administered 2022-09-28 – 2022-09-30 (×3): 10 mg via ORAL
  Filled 2022-09-28 (×3): qty 1

## 2022-09-28 MED ORDER — DRONABINOL 2.5 MG PO CAPS
2.5000 mg | ORAL_CAPSULE | Freq: Two times a day (BID) | ORAL | Status: DC
  Administered 2022-09-28 – 2022-09-30 (×5): 2.5 mg via ORAL
  Filled 2022-09-28 (×5): qty 1

## 2022-09-28 NOTE — Plan of Care (Signed)

## 2022-09-28 NOTE — Assessment & Plan Note (Signed)
Wound care consult for care during stay.

## 2022-09-28 NOTE — Progress Notes (Signed)
Brief Neuro Update:  Was seen by Dr. Curly Shores after midnight. I evaluated her briefly. Reports pain in L stump. Suspect encephalopathy was likely 2/2 elevated temp in her room at her facility.  Workup so far including rEEG with no GPDs. No seizures. No epileptiform discharges. Would not expect cerebellar injury from [place] to cause seizures anyway.  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  Patient reports that this has been going on for some time and has noticed trouble when she is holding utensils or cups.  Recs: - alternative to Cefepime if possible - judicious use of opiods. - we will sign off. Please feel free to contact us with any questions or concerns.  Discussed with Dr. Lala Lund over secure chat.  [facility] Pager Number [phone]

## 2022-09-28 NOTE — Assessment & Plan Note (Signed)
Cont home semglee 5u BID and 3u mealtime novolog TID CBG checks AC/HS

## 2022-09-28 NOTE — Assessment & Plan Note (Signed)
Unclear cause of AMS today, now with presumably baseline mental status however. Work up in ED is largely unremarkable. See neuro note Getting EEG to look for seizure activity Admitting overnight for observation.

## 2022-09-28 NOTE — Progress Notes (Addendum)
Per RN: pt now with increased pain at wound vac sites, after moving around now having some serosanguineous drainage.  Will have RN send sample down to lab for culture and test for MRSA (since not currently on MRSA coverage).

## 2022-09-28 NOTE — ED Provider Notes (Signed)
72yo female with AMS, unknown reason, seen by neuro and negative MRI brain, unsure if cause is cefepime. Plan is to admit to hospitalist for altered mental status for further work up.  No neuro recommendations at this time other than dc defepime if possible. On abx for groin wounds with wound vac (ID note on file).  Physical Exam  BP (!) 168/75 (BP Location: Right Arm)   Pulse 85   Temp 97.8 F (36.6 C) (Oral)   Resp 13   Ht 5' (1.524 m)   Wt 55.4 kg   SpO2 98%   BMI 23.85 kg/m   Physical Exam  Procedures  Procedures  ED Course / MDM    Medical Decision Making Amount and/or Complexity of Data Reviewed Labs: ordered. Radiology: ordered.  Risk OTC drugs. Prescription drug management. Decision regarding hospitalization.   Discussed with Dr. Alcario Drought with [facility] who will consult for admission.        Tacy Learn, PA-C 09/28/22 0020    Fatima Blank, MD 09/28/22 [phone]

## 2022-09-28 NOTE — H&P (Addendum)
History and Physical    Patient: Heather [name] NIO:[id number] DOB: 15-Apr-1951 DOA: 09/27/2022 DOS: the patient was seen and examined on 09/28/2022 PCP: [name], [place]  Patient coming from: SNF  Chief Complaint:  Chief Complaint  Patient presents with   Altered Mental Status   HPI: Heather [name] is a 72 y.o. female with medical history significant of DM-2, diastolic CHF, COPD, HTN, HLD, GERD, PAD, AS s/p TAVR, tobacco use disorder, anxiety, depression and schizophrenia.  Pt with recent almost 2 month long hospital stay from 08/04/22-09/22/22.  Pt presented to ED on 12/11 with SBP in 200s, dizziness, headache and blurry vision, and admitted to ICU for hemorrhagic cerebral stroke and BLE ischemia.  She underwent BLE femoral endarterectomy and patch angioplasty with iliac stenting and 4 compartment fasciotomies.  Postop, she remained on vent and Cleviprex.  Eventually, she was extubated and transferred to hospitalist service on 12/17.  She also underwent left AKA and excisional debridement of bilateral groin wounds with VAC placement on 12/21.  Hospital course and then noteworthy for episode of hypoglycemia with insulin adjustments.  Initially required tube feed that was later discontinued.  Remains physically deconditioned.  Wound VAC discontinued on 1/18.  She is now WTD dressing with silver to bilateral groins. Concern about persistent or worsening greenish drainage with tunneling.  Underwent groin debridement and wound VAC placement on 1/23.  Antibiotics broadened to IV cefepime and Flagyl per ID and VVS.  Bilateral wound vacs in place.  Plan for 6 weeks abx.  Hospitalization complicated by COVID 19 infection, symptoms resolved at time of discharge.  Stable for discharge on 1/29.  Following DC, pt in SNF.  Today patients family reportedly went to visit pt in SNF, found her soaking wet with heat in room set to 85 degrees.  Pt with AMS.  Pt brought in to ED.  Reportedly remained confused in ED per  EDP.  Work up in ED largely negative for new acute findings.  At time of neurologist's and then my evaluation however, pt now awake, AAOx3, suspicious that she's at or near baseline.  EDP and RN report she has significantly improved from presentation.  She denies any acute complaints, other than she is thirsty, and she feels a bit cold.   Review of Systems: As mentioned in the history of present illness. All other systems reviewed and are negative. Past Medical History:  Diagnosis Date   (HFpEF) heart failure with preserved ejection fraction (HCC)    Angina pectoris (HCC)    Anxiety    Aortic atherosclerosis (HCC)    Basal cell carcinoma    Bilateral carotid artery disease (HCC)    a.) carotid doppler 03/18/2021: 8-18% RICA, 29-93% LICA   Bipolar disorder (HCC)    Cervicalgia    Chronic mesenteric ischemia (HCC)    a.) s/p PTA 11/13/2020 --> 6 x 16 mm lifestream ballon expanding stent to SMA   COPD (chronic obstructive pulmonary disease) (HCC)    Coronary artery disease 03/20/2014   a.) LHC/PCI 03/20/2014: 70% mLAD (2.5 x 28 mm Xience Alpine DES), 70% dLCx, 70% dRCA; b.) LHC 01/25/2016: 90% ISR mLAD --> mLAD dissection with in stent --> 2.75 x 22 mm Resolute DES; c.) R/LHC 09/29/2016: 50% ISR mLAD, 70% dLAD, mPA 37, PCWP 24, AO sat 90%, PA sat 46%, CO 3.7, CI 2.46 - med mgmt   DDD (degenerative disc disease), cervical    Depression    Diabetic polyneuropathy (HCC)    GERD (gastroesophageal reflux disease)  Hyperlipidemia    Hypertension    Long term current use of antithrombotics/antiplatelets    a.) on DAPT (ASA + clopidogrel)   Memory loss    NSTEMI (non-ST elevated myocardial infarction) ([place]) 07/2016   a.) troponins trended (normal < 0.034 ng/mL): 0.325 --> 1.480 --> 2.330 --> 1.660 --> 0.169 ng/mL   PAD (peripheral artery disease) (HCC)    a.) s/p PTA and stenting SMA 11/13/2020; b.) s/p PTA and stenting of RIGHT SFA and popliteal 04/12/2021; c.) s/p PTA and kissing balloon  stents to BILATERAL CIAs 05/28/2021   Respiratory failure with hypoxia (HCC)    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   a.) 23 mm Sapien 3 via suprasternal approach   Schizophrenia ([place])    Severe aortic stenosis    a.) s/p TAVR 09/30/2016; 23 mm Sapien 3 via a suprasternal approach   Type 2 diabetes mellitus treated with insulin ([place])    Vertigo    Past Surgical History:  Procedure Laterality Date   AMPUTATION Left 08/14/2022   Procedure: ABOVE KNEE AMPUTATION;  Surgeon: Waynetta Sandy, MD;  Location: [place] [facility];  Service: Vascular;  Laterality: Left;   AORTIC VALVE REPLACEMENT  09/30/2016   Procedure: TRANSCATHETER AORTIC VALVE REPAIR   APPLICATION OF WOUND VAC Bilateral 08/14/2022   Procedure: APPLICATION OF WOUND VAC;  Surgeon: Waynetta Sandy, MD;  Location: [facility];  Service: Vascular;  Laterality: Bilateral;   BLADDER SURGERY     CATARACT EXTRACTION, BILATERAL     CHOLECYSTECTOMY     COLONOSCOPY     CORONARY ANGIOPLASTY WITH STENT PLACEMENT  03/20/2014   Procedure: CORONARY ANGIOPLASTY WITH STENT PLACEMENT; Location: [facility]; Surgeon: Jacques Earthly, MD   CORONARY ANGIOPLASTY WITH STENT PLACEMENT Left 01/25/2016   Procedure: CORONARY ANGIOPLASTY WITH STENT PLACEMENT; Location: [facility]; Surgeon: Jacques Earthly, MD   ENDARTERECTOMY FEMORAL Bilateral 08/04/2022   Procedure: BILATERAL FEMORAL ENDARTERECTOMY;  Surgeon: Waynetta Sandy, MD;  Location: [place];  Service: Vascular;  Laterality: Bilateral;   FASCIECTOMY Bilateral 08/04/2022   Procedure: BILATERAL LOWER EXTREMITY FASCIECTOMIES;  Surgeon: Waynetta Sandy, MD;  Location: [facility];  Service: Vascular;  Laterality: Bilateral;   FOOT SURGERY Right    GROIN DEBRIDEMENT Bilateral 08/14/2022   Procedure: GROIN DEBRIDEMENT;  Surgeon: Waynetta Sandy, MD;  Location: [place];  Service: Vascular;  Laterality: Bilateral;   GROIN DEBRIDEMENT Bilateral 09/16/2022   Procedure: GROIN  DEBRIDEMENT POSSIBLE VAC PLACMENT;  Surgeon: Broadus John, MD;  Location: [place];  Service: Vascular;  Laterality: Bilateral;   HEMORRHOID SURGERY     INSERTION OF ILIAC STENT Bilateral 08/04/2022   Procedure: INSERTION OF BILATERAL ILIAC STENTS;  Surgeon: Waynetta Sandy, MD;  Location: [place];  Service: Vascular;  Laterality: Bilateral;   IRRIGATION AND DEBRIDEMENT HEMATOMA Left 07/08/2014   GROIN   LOWER EXTREMITY ANGIOGRAM Bilateral 08/04/2022   Procedure: LOWER EXTREMITY ANGIOGRAM;  Surgeon: Waynetta Sandy, MD;  Location: [place];  Service: Vascular;  Laterality: Bilateral;   LOWER EXTREMITY ANGIOGRAPHY Right 04/12/2021   Procedure: Lower Extremity Angiography;  Surgeon: [name], MD;  Location: [place] [facility];  Service: Cardiovascular;  Laterality: Right;   LOWER EXTREMITY ANGIOGRAPHY Left 05/28/2021   Procedure: LOWER EXTREMITY ANGIOGRAPHY;  Surgeon: [name], MD;  Location: [place] [facility];  Service: Cardiovascular;  Laterality: Left;   LOWER EXTREMITY ANGIOGRAPHY Left 03/25/2022   Procedure: Lower Extremity Angiography;  Surgeon: [name], MD;  Location: [facility];  Service:  Cardiovascular;  Laterality: Left;   PATCH ANGIOPLASTY  08/04/2022   Procedure: LEFT FEMORAL PATCH ANGIOPLASTY USING XENOSURE BIOLOGIC PATCH;  Surgeon: Waynetta Sandy, MD;  Location: [place] [facility];  Service: Vascular;;   PSEUDOANEURYSM REPAIR Left 06/30/2014   FEMORAL ARTERY   RIGHT/LEFT HEART CATH AND CORONARY ANGIOGRAPHY Bilateral 09/29/2016   Procedure: RIGHT/LEFT HEART CATH AND CORONARY ANGIOGRAPHY; Location [facility]; Surgeon: Jacques Earthly, MD   THROMBECTOMY FEMORAL ARTERY Bilateral 08/04/2022   Procedure: BILATERAL LOWER EXTREMITY THROMBECTOMY;  Surgeon: Waynetta Sandy, MD;  Location: [place];  Service: Vascular;  Laterality: Bilateral;   TUBAL LIGATION     VISCERAL ANGIOGRAPHY N/A 11/13/2020   Procedure: VISCERAL ANGIOGRAPHY;   Surgeon: [name], MD;  Location: [facility];  Service: Cardiovascular;  Laterality: N/A;   Social History:  reports that she has been smoking cigarettes. She has a 72.00 pack-year smoking history. She has never used smokeless tobacco. She reports that she does not currently use alcohol. She reports that she does not currently use drugs.  Allergies  Allergen Reactions   Iodinated Contrast Media Hives   Sulfa Antibiotics Hives   Shellfish Allergy Nausea And Vomiting and Rash    Scallops specifically     Family History  Problem Relation Age of Onset   Mental illness Mother    Breast cancer Neg Hx     Prior to Admission medications   Medication Sig Start Date End Date Taking? Authorizing Provider  albuterol (VENTOLIN HFA) 108 (90 Base) MCG/ACT inhaler Inhale 2 puffs into the lungs every 4 (four) hours as needed for wheezing or shortness of breath.   Yes [provider]  atorvastatin (LIPITOR) 80 MG tablet Take 80 mg by mouth at bedtime.   Yes [provider]  carvedilol (COREG) 6.25 MG tablet Take 1 tablet (6.25 mg total) by mouth 2 (two) times daily with a meal. 09/22/22 10/22/22 Yes Elodia Florence., MD  ceFEPime (MAXIPIME) IVPB Inject 2 g into the vein every 8 (eight) hours. Indication:  infected graft  First Dose: Yes Last Day of Therapy:  10/28/22 Labs - Once weekly:  CBC/D and BMP, Labs - Every other week:  ESR and CRP Method of administration: IV Push Remove PICC line at completion of antibiotic therapy Method of administration may be changed at the discretion of home infusion pharmacist based upon assessment of the patient and/or caregiver's ability to self-administer the medication ordered. Patient taking differently: Inject 2 g into the vein every 8 (eight) hours. 09/22/22 11/01/22 Yes Elodia Florence., MD  cetirizine (ZYRTEC) 10 MG tablet Take 10 mg by mouth daily.   Yes [provider]  cholecalciferol (VITAMIN D3) 25 MCG  (1000 UNIT) tablet Take 1,000 Units by mouth daily.   Yes [provider]  clopidogrel (PLAVIX) 75 MG tablet Take 1 tablet (75 mg total) by mouth daily. 11/15/20  Yes Regalado, Belkys A, MD  dronabinol (MARINOL) 2.5 MG capsule Take 1 capsule (2.5 mg total) by mouth 2 (two) times daily before lunch and supper. 09/22/22 10/22/22 Yes Elodia Florence., MD  escitalopram (LEXAPRO) 10 MG tablet Take 1 tablet (10 mg total) by mouth daily. 09/22/22 10/22/22 Yes Elodia Florence., MD  fluticasone [facility]) 50 MCG/ACT nasal spray Place 1 spray into both nostrils daily as needed for allergies.   Yes [provider]  fluticasone-salmeterol (ADVAIR) 250-50 MCG/ACT AEPB Inhale 1 puff into the lungs daily.   Yes [provider]  heparin lock flush  100 UNIT/ML SOLN injection Inject 10 Units into the vein 3 (three) times daily. Use SASH method with IV ABT   Yes [provider]  insulin aspart (NOVOLOG) 100 UNIT/ML injection Inject 3 Units into the skin 3 (three) times daily with meals. Hold for NPO or consuming less than 50% of meals Patient taking differently: Inject 3 Units into the skin See admin instructions. Inject 3 units subcutaneously with meals. HOLD for NPO or consuming < 50% of meals. 09/22/22  Yes Elodia Florence., MD  insulin glargine (LANTUS) 100 UNIT/ML injection Inject 5 Units into the skin 2 (two) times daily.   Yes [provider]  metroNIDAZOLE (FLAGYL) 500 MG tablet Take 1 tablet (500 mg total) by mouth every 12 (twelve) hours. 09/22/22 10/28/22 Yes Elodia Florence., MD  mirtazapine (REMERON) 7.5 MG tablet Take 1 tablet (7.5 mg total) by mouth at bedtime. 09/22/22 10/22/22 Yes Elodia Florence., MD  Multiple Vitamins-Minerals (MULTIVITAMIN WITH MINERALS) tablet Take 1 tablet by mouth daily.   Yes [provider]  pantoprazole (PROTONIX) 40 MG tablet Take 1 tablet (40 mg total) by mouth daily. 09/22/22 10/22/22 Yes Elodia Florence., MD  polyethylene glycol (MIRALAX / GLYCOLAX) 17 g packet Take 17 g by mouth daily. Titrate as needed for constipation. Patient taking differently: Take 17 g by mouth daily. 09/22/22  Yes Elodia Florence., MD  sodium chloride flush 0.9 % SOLN injection Inject 10 mLs into the vein 3 (three) times daily. Use SASH method with IV ABT   Yes [provider]  traZODone (DESYREL) 50 MG tablet Take 0.5 tablets (25 mg total) by mouth at bedtime as needed for sleep. 09/22/22 10/22/22 Yes Elodia Florence., MD    Physical Exam: Vitals:   09/27/22 2100 09/27/22 2105 09/27/22 2200 09/28/22 0030  BP: (!) 175/66  (!) 168/75 (!) 145/66  Pulse: 85   75  Resp: 13   12  Temp:  97.8 F (36.6 C)    TempSrc:  Oral    SpO2: 96%  98% 99%  Weight:      Height:       Constitutional: NAD, calm, comfortable Respiratory: clear to auscultation bilaterally, no wheezing, no crackles. Normal respiratory effort. No accessory muscle use.  Cardiovascular: Regular rate and rhythm, no murmurs / rubs / gallops. No extremity edema. 2+ pedal pulses. No carotid bruits.  Abdomen: no tenderness, no masses palpated. No hepatosplenomegaly. Bowel sounds positive.  Musculoskeletal: L AKA Skin: B groin wounds, not draining into wound vac at time of my exam, however informed later during shift that she has drainage by RN on floor,  Slight erythema on L side under clear dressing, stage 2 sacral decubitus. Neurologic: Grossly nonfocal Psychiatric: Normal judgment and insight. Alert and oriented x 3. Normal mood.   Data Reviewed:     MRI brain: IMPRESSION: 1. No acute intracranial abnormality. 2. 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  CXR neg     Latest Ref Rng & Units 09/27/2022    4:47 PM 09/22/2022    2:40 AM 09/20/2022    5:02 AM  CMP  Glucose 70 - 99 mg/dL 246  160  151   BUN 8 - 23 mg/dL '9  11  11   '$ Creatinine 0.44 - 1.00 mg/dL 0.66  0.73  0.77   Sodium 135 - 145  mmol/L 135  135  137   Potassium 3.5 - 5.1 mmol/L 3.9  3.9  3.9   Chloride 98 - 111 mmol/L 103  103  105   CO2 22 - 32 mmol/L '23  25  26   '$ Calcium 8.9 - 10.3 mg/dL 9.1  8.8  8.4   Total Protein 6.5 - 8.1 g/dL 6.1  5.6  5.3   Total Bilirubin 0.3 - 1.2 mg/dL 0.4  0.5  0.4   Alkaline Phos 38 - 126 U/L 67  64  70   AST 15 - 41 U/L '19  13  13   '$ ALT 0 - 44 U/L 8  <5  <5       Latest Ref Rng & Units 09/27/2022    4:47 PM 09/22/2022    2:40 AM 09/20/2022    5:02 AM  CBC  WBC 4.0 - 10.5 K/uL 6.4  8.6  6.2   Hemoglobin 12.0 - 15.0 g/dL 10.7  9.1  9.2   Hematocrit 36.0 - 46.0 % 32.5  27.8  29.3   Platelets 150 - 400 K/uL 503  550  542    Urinalysis    Component Value Date/Time   COLORURINE YELLOW 09/27/2022 1647   APPEARANCEUR HAZY (A) 09/27/2022 1647   APPEARANCEUR Clear 06/20/2012 2127   LABSPEC 1.017 09/27/2022 1647   LABSPEC 1.022 06/20/2012 2127   PHURINE 5.0 09/27/2022 1647   GLUCOSEU 150 (A) 09/27/2022 1647   GLUCOSEU >=500 06/20/2012 2127   HGBUR SMALL (A) 09/27/2022 1647   BILIRUBINUR NEGATIVE 09/27/2022 1647   BILIRUBINUR Negative 06/20/2012 2127   KETONESUR NEGATIVE 09/27/2022 1647   PROTEINUR 100 (A) 09/27/2022 1647   NITRITE NEGATIVE 09/27/2022 1647   LEUKOCYTESUR NEGATIVE 09/27/2022 1647   LEUKOCYTESUR Negative 06/20/2012 2127      Assessment and Plan: * Acute encephalopathy Unclear cause of AMS today, now with presumably baseline mental status however. Work up in ED is largely unremarkable. See neuro note Getting EEG to look for seizure activity Admitting overnight for observation.  Right groin wound Infected B groin wounds Continue cefepime + flagyl Wound care consult for wound vac management.  Decubitus ulcer of sacral region, stage 2 ([place]) Wound care consult for care during stay.  Hypertension, benign Cont coreg  Diabetes mellitus ([place]) Cont home semglee 5u BID and 3u mealtime novolog TID CBG checks AC/HS      Advance Care Planning:   Code  Status: Full Code  Consults: Neurology  Family Communication: No family in room  Severity of Illness: The appropriate patient status for this patient is OBSERVATION. Observation status is judged to be reasonable and necessary in order to provide the required intensity of service to ensure the patient's safety. The patient's presenting symptoms, physical exam findings, and initial radiographic and laboratory data in the context of their medical condition is felt to place them at decreased risk for further clinical deterioration. Furthermore, it is anticipated that the patient will be medically stable for discharge from the hospital within 2 midnights of admission.   Author: [name]., DO 09/28/2022 1:02 AM  For on call review [email or web address].

## 2022-09-28 NOTE — Assessment & Plan Note (Signed)
Cont coreg

## 2022-09-28 NOTE — Procedures (Signed)
Patient Name: Heather [name]  MRN: [id number]  Epilepsy Attending: Lora Havens  Referring Physician/Provider: Lorenza Chick, MD  Date: 09/28/2022  Duration: 29.41 mins  Patient history: 73yo M with ams. EEG to evaluate for seizure.   Level of alertness: Awake, asleep  AEDs during EEG study: None  Technical aspects: This EEG study was done with scalp electrodes positioned according to the 10-20 International system of electrode placement. Electrical activity was reviewed with band pass filter of 1-'70Hz'$ , sensitivity of 7 uV/mm, display speed of 60m/sec with a '60Hz'$  notched filter applied as appropriate. EEG data were recorded continuously and digitally stored.  Video monitoring was available and reviewed as appropriate.  Description: The posterior dominant rhythm consists of 7.5 Hz activity of moderate voltage (25-35 uV) seen predominantly in posterior head regions, symmetric and reactive to eye opening and eye closing. Sleep was characterized by vertex waves, sleep spindles (12 to 14 Hz), maximal frontocentral region. EEG showed intermittent generalized polymorphic sharply contoured 3 to 6 Hz theta-delta slowing, at times with triphasic morphology . Hyperventilation and photic stimulation were not performed.     ABNORMALITY - Intermittent slow, generalized  IMPRESSION: This study is suggestive of mild to moderate diffuse encephalopathy, nonspecific etiology but could be related to toxic-metabolic causes. No seizures or definite epileptiform discharges were seen throughout the recording.  Alquan Morrish OBarbra Sarks

## 2022-09-28 NOTE — Progress Notes (Signed)
EEG complete - results pending 

## 2022-09-28 NOTE — ED Notes (Signed)
Daughter Lenn Cal [phone] would like an update asap

## 2022-09-28 NOTE — Consult Note (Signed)
Neurology Consultation Reason for Consult: AMS  Requesting Physician: [name]  CC: Confusion  History is obtained from: Patient and chart review   HPI: Heather [name] is a 72 y.o. female with a past medical history significant for smoking 2 packs/day, hypertension, hyperlipidemia, coronary artery disease on DAPT, COPD, peripheral artery disease, aortic insufficiency/stenosis s/p TAVR, anxiety/depression, schizophrenia, sacral decubitus ulcer  Per EDP: "The patient's family reports going to visit the patient and finding her soaking wet, with a heat set at 24 degrees. The patient has been sluggish to answer questions. She is alert to person and place but not to event or time. They states she seems more confused than usual."  Reportedly she remained confused in the ED.  At the time of my evaluation she was surprised to find she was at [facility] again and did not know that it was Saturday/Sunday, but was aware that it was February 2024 and could relay recent details of her history to me.  Specifically she recently had a prolonged hospitalization due to a cerebellar vermis IPH as well as bilateral lower extremity ischemia s/p endarterectomy and ultimately left AKA complicated by wound infection necessitating debridement, on cefepime and Flagyl with wound VAC in place.  She also contracted COVID-19 while hospitalized.  She was only recently discharged, and tells me "I do not want to be in the hospital for over 50 days again"  She states she feels fine and has no acute complaints  ROS: All other review of systems was negative except as noted in the HPI.   Past Medical History:  Diagnosis Date   (HFpEF) heart failure with preserved ejection fraction (HCC)    Angina pectoris (HCC)    Anxiety    Aortic atherosclerosis (HCC)    Basal cell carcinoma    Bilateral carotid artery disease (HCC)    a.) carotid doppler 03/18/2021: 9-93% RICA, 57-01% LICA   Bipolar disorder (HCC)    Cervicalgia     Chronic mesenteric ischemia (HCC)    a.) s/p PTA 11/13/2020 --> 6 x 16 mm lifestream ballon expanding stent to SMA   COPD (chronic obstructive pulmonary disease) (HCC)    Coronary artery disease 03/20/2014   a.) LHC/PCI 03/20/2014: 70% mLAD (2.5 x 28 mm Xience Alpine DES), 70% dLCx, 70% dRCA; b.) LHC 01/25/2016: 90% ISR mLAD --> mLAD dissection with in stent --> 2.75 x 22 mm Resolute DES; c.) R/LHC 09/29/2016: 50% ISR mLAD, 70% dLAD, mPA 37, PCWP 24, AO sat 90%, PA sat 46%, CO 3.7, CI 2.46 - med mgmt   DDD (degenerative disc disease), cervical    Depression    Diabetic polyneuropathy (HCC)    GERD (gastroesophageal reflux disease)    Hyperlipidemia    Hypertension    Long term current use of antithrombotics/antiplatelets    a.) on DAPT (ASA + clopidogrel)   Memory loss    NSTEMI (non-ST elevated myocardial infarction) ([place]) 07/2016   a.) troponins trended (normal < 0.034 ng/mL): 0.325 --> 1.480 --> 2.330 --> 1.660 --> 0.169 ng/mL   PAD (peripheral artery disease) ([place])    a.) s/p PTA and stenting SMA 11/13/2020; b.) s/p PTA and stenting of RIGHT SFA and popliteal 04/12/2021; c.) s/p PTA and kissing balloon stents to BILATERAL CIAs 05/28/2021   Respiratory failure with hypoxia (HCC)    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   a.) 23 mm Sapien 3 via suprasternal approach   Schizophrenia ([place])    Severe aortic stenosis  a.) s/p TAVR 09/30/2016; 23 mm Sapien 3 via a suprasternal approach   Type 2 diabetes mellitus treated with insulin ([place])    Vertigo    Past Surgical History:  Procedure Laterality Date   AMPUTATION Left 08/14/2022   Procedure: ABOVE KNEE AMPUTATION;  Surgeon: Waynetta Sandy, MD;  Location: [place];  Service: Vascular;  Laterality: Left;   AORTIC VALVE REPLACEMENT  09/30/2016   Procedure: TRANSCATHETER AORTIC VALVE REPAIR   APPLICATION OF WOUND VAC Bilateral 08/14/2022   Procedure: APPLICATION OF WOUND VAC;  Surgeon: Waynetta Sandy, MD;  Location: [place];  Service: Vascular;  Laterality: Bilateral;   BLADDER SURGERY     CATARACT EXTRACTION, BILATERAL     CHOLECYSTECTOMY     COLONOSCOPY     CORONARY ANGIOPLASTY WITH STENT PLACEMENT  03/20/2014   Procedure: CORONARY ANGIOPLASTY WITH STENT PLACEMENT; Location: [facility]; Surgeon: Jacques Earthly, MD   CORONARY ANGIOPLASTY WITH STENT PLACEMENT Left 01/25/2016   Procedure: CORONARY ANGIOPLASTY WITH STENT PLACEMENT; Location: [facility]; Surgeon: Jacques Earthly, MD   ENDARTERECTOMY FEMORAL Bilateral 08/04/2022   Procedure: BILATERAL FEMORAL ENDARTERECTOMY;  Surgeon: Waynetta Sandy, MD;  Location: [place];  Service: Vascular;  Laterality: Bilateral;   FASCIECTOMY Bilateral 08/04/2022   Procedure: BILATERAL LOWER EXTREMITY FASCIECTOMIES;  Surgeon: Waynetta Sandy, MD;  Location: [place];  Service: Vascular;  Laterality: Bilateral;   FOOT SURGERY Right    GROIN DEBRIDEMENT Bilateral 08/14/2022   Procedure: GROIN DEBRIDEMENT;  Surgeon: Waynetta Sandy, MD;  Location: [place];  Service: Vascular;  Laterality: Bilateral;   GROIN DEBRIDEMENT Bilateral 09/16/2022   Procedure: GROIN DEBRIDEMENT POSSIBLE VAC PLACMENT;  Surgeon: Broadus John, MD;  Location: [place];  Service: Vascular;  Laterality: Bilateral;   HEMORRHOID SURGERY     INSERTION OF ILIAC STENT Bilateral 08/04/2022   Procedure: INSERTION OF BILATERAL ILIAC STENTS;  Surgeon: Waynetta Sandy, MD;  Location: [place];  Service: Vascular;  Laterality: Bilateral;   IRRIGATION AND DEBRIDEMENT HEMATOMA Left 07/08/2014   GROIN   LOWER EXTREMITY ANGIOGRAM Bilateral 08/04/2022   Procedure: LOWER EXTREMITY ANGIOGRAM;  Surgeon: Waynetta Sandy, MD;  Location: [place];  Service: Vascular;  Laterality: Bilateral;   LOWER EXTREMITY ANGIOGRAPHY Right 04/12/2021   Procedure: Lower Extremity Angiography;  Surgeon: [name], MD;  Location: [place] [facility];  Service: Cardiovascular;   Laterality: Right;   LOWER EXTREMITY ANGIOGRAPHY Left 05/28/2021   Procedure: LOWER EXTREMITY ANGIOGRAPHY;  Surgeon: [name], MD;  Location: [place] [facility];  Service: Cardiovascular;  Laterality: Left;   LOWER EXTREMITY ANGIOGRAPHY Left 03/25/2022   Procedure: Lower Extremity Angiography;  Surgeon: [name], MD;  Location: [place] [facility];  Service: Cardiovascular;  Laterality: Left;   PATCH ANGIOPLASTY  08/04/2022   Procedure: LEFT FEMORAL PATCH ANGIOPLASTY USING XENOSURE BIOLOGIC PATCH;  Surgeon: Waynetta Sandy, MD;  Location: [place];  Service: Vascular;;   PSEUDOANEURYSM REPAIR Left 06/30/2014   FEMORAL ARTERY   RIGHT/LEFT HEART CATH AND CORONARY ANGIOGRAPHY Bilateral 09/29/2016   Procedure: RIGHT/LEFT HEART CATH AND CORONARY ANGIOGRAPHY; Location [facility]; Surgeon: Jacques Earthly, MD   THROMBECTOMY FEMORAL ARTERY Bilateral 08/04/2022   Procedure: BILATERAL LOWER EXTREMITY THROMBECTOMY;  Surgeon: Waynetta Sandy, MD;  Location: [place];  Service: Vascular;  Laterality: Bilateral;   TUBAL LIGATION     VISCERAL ANGIOGRAPHY N/A 11/13/2020   Procedure: VISCERAL ANGIOGRAPHY;  Surgeon: [name], MD;  Location: [place] [facility];  Service: Cardiovascular;  Laterality: N/A;  Family History  Problem Relation Age of Onset   Mental illness Mother    Breast cancer Neg Hx    Social History:  reports that she has been smoking cigarettes. She has a 72.00 pack-year smoking history. She has never used smokeless tobacco. She reports that she does not currently use alcohol. She reports that she does not currently use drugs.  Exam: Current vital signs: BP (!) 168/75 (BP Location: Right Arm)   Pulse 85   Temp 97.8 F (36.6 C) (Oral)   Resp 13   Ht 5' (1.524 m)   Wt 55.4 kg   SpO2 98%   BMI 23.85 kg/m  Vital signs in last 24 hours: Temp:  [97.8 F (36.6 C)-98 F (36.7 C)] 97.8 F (36.6 C) (02/03 2105) Pulse Rate:  [73-85] 85 (02/03  2100) Resp:  [13-20] 13 (02/03 2100) BP: (163-184)/(66-80) 168/75 (02/03 2200) SpO2:  [95 %-100 %] 98 % (02/03 2200) Weight:  [55.4 kg] 55.4 kg (02/03 1647)   Physical Exam  Constitutional: Appears moderately chronically ill but in no acute distress Psych: Affect pleasant and cooperative Eyes: No scleral injection HENT: No oropharyngeal obstruction.  MSK: Left AKA Cardiovascular: Perfusing extremities well Respiratory: Effort normal, non-labored breathing GI: Soft.  No distension. There is no tenderness.  Skin: Wound VAC in place, minimal drainage in the VAC.  Left leg incision appears to be healing well  Neuro: Mental Status: Patient is awake, alert, oriented to person, month, year, but was surprised to find that she was back at [facility], does not know exactly why she was brought here but does vaguely remember being transported by ambulance Patient is able to give a clear and coherent history for more remote events but not events leading up to hospitalization No signs of aphasia or neglect Cranial Nerves: II: Visual Fields are full. Pupils are equal, round, and reactive to light.   III,IV, VI: EOMI without ptosis or diploplia.  V: Facial sensation is symmetric to light touch VII: Facial movement is symmetric.  VIII: hearing is intact to voice X: Uvula elevates symmetrically XI: Shoulder shrug is symmetric. XII: tongue is midline without atrophy or fasciculations.  Motor: Tone is normal. Bulk is normal. 5/5 strengt in bilateral upper extremities except slight triceps weakness bilaterally.  Right lower extremity not quite antigravity, left lower extremity antigravity Sensory: Sensation is symmetric to light touch and temperature in the arms and upper thighs.  Reports no new numbness Cerebellar: Finger-nose intact bilaterally Gait:  Deferred   I have reviewed labs in epic and the results pertinent to this consultation are:  Basic Metabolic Panel: Recent Labs   Lab 09/22/22 0240 09/27/22 1647  NA 135 135  K 3.9 3.9  CL 103 103  CO2 25 23  GLUCOSE 160* 246*  BUN 11 9  CREATININE 0.73 0.66  CALCIUM 8.8* 9.1  MG 1.8  --   PHOS 2.8  --     CBC: Recent Labs  Lab 09/22/22 0240 09/27/22 1647  WBC 8.6 6.4  NEUTROABS 5.9 4.0  HGB 9.1* 10.7*  HCT 27.8* 32.5*  MCV 92.4 95.3  PLT 550* 503*    Coagulation Studies: No results for input(s): "LABPROT", "INR" in the last 72 hours.   Ammonia normal  UA unremarkable  Chest x-ray unremarkable  EKG unremarkable  I have reviewed the images obtained:  Head CT: No evidence of acute hemorrhage. Small areas of hypoattenuation in the right cerebellum with uncertain significance. These may represent age-indeterminate infarcts.  Moderate brain parenchymal volume loss and deep white matter microangiopathy.  MRI brain personally reviewed, agree with radiology no acute intracranial process 1. No acute intracranial abnormality. 2. 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  Impression: Unclear etiology of altered mental status earlier today but appears to be at baseline from last neurological notes.  Perhaps this is related to the heat being elevated in her room.  On the differential is delirium versus seizure.  Reasonable to admit for observation overnight.  Of note cefepime does put her at risk of neurotoxicity and seizures.  If EEG is positive, would appreciate infectious disease assistance in considering other antibiotic choices  Recommendations: -Routine EEG -Additional reversible causes of altered mental status labs, thiamine, B12, RPR, HIV, TSH; treat as needed with B12 goal greater than 500 -Neurology will follow-up EEG but otherwise will be available as needed going forward  [place] [phone] Available 7 PM to 7 AM, outside of these hours please call Neurologist on call as listed on Amion.

## 2022-09-28 NOTE — Progress Notes (Signed)
PROGRESS NOTE                                                                                                                                                                                                             Patient Demographics:    Heather [name], is a 72 y.o. female, DOB - 09-24-1950, KVQ:[id number]  Outpatient Primary MD for the patient is [name], FNP    LOS - 0  Admit date - 09/27/2022    Chief Complaint  Patient presents with   Altered Mental Status       Brief Narrative (HPI from H&P)   72 y.o. female with medical history significant of DM-2, diastolic CHF, COPD, HTN, HLD, GERD, PAD, AS s/p TAVR, tobacco use disorder, anxiety, depression and schizophrenia.   Pt with recent almost 2 month long hospital stay from 08/04/22-09/22/22.  Pt presented to ED on 12/11 with SBP in 200s, dizziness, headache and blurry vision, and admitted to ICU for hemorrhagic cerebral stroke and BLE ischemia.  She underwent BLE femoral endarterectomy and patch angioplasty with iliac stenting and 4 compartment fasciotomies.  Postop, she remained on vent and Cleviprex.  Eventually, she was extubated and transferred to hospitalist service on 12/17.  She also underwent left AKA and excisional debridement of bilateral groin wounds with VAC placement on 12/21.  Hospital course and then noteworthy for episode of hypoglycemia with insulin adjustments.  Initially required tube feed that was later discontinued.  Remains physically deconditioned.  Wound VAC discontinued on 1/18.  She is now WTD dressing with silver to bilateral groins. Concern about persistent or worsening greenish drainage with tunneling.  Underwent groin debridement and wound VAC placement on 1/23.  Antibiotics broadened to IV cefepime and Flagyl per ID and VVS.  Bilateral wound vacs in place.  Plan for 6 weeks abx. Hospitalization complicated by COVID 19 infection, symptoms resolved  at time of discharge.   Following DC, pt in SNF. Today patients family reportedly went to visit pt in SNF, found her soaking wet with heat in room set to 85 degrees, she appeared confused and weak, she was brought to the ER where she was seen by neurology and admitted for encephalopathy and weakness evaluation.    Subjective:    Heather [name] today has, No headache, No chest pain, No abdominal pain - No Nausea, No  new weakness tingling or numbness, no SOB   Assessment  & Plan :    Acute metabolic encephalopathy.  Likely caused by dehydration due to exposure to hot temperatures at SNF, also according to the daughter patient is not receiving appropriate dietary restrictions at SNF and her blood pressure and sugars have been running very high.  Thankfully her workup here which includes MRI brain, EEG, chest x-ray, TSH, ammonia and UA all appears stable, her mentation has improved and she is close to her baseline.  She has been seen and cleared by neurology.  Will adjust her medications for better blood pressure and glycemic control, if stable likely discharge back to SNF on 09/29/2022.  Right groin wound - Infected B groin wounds, Continue cefepime + flagyl with stop date per last discharge summary on 10/28/2022, Wound care consult for wound vac management.  Decubitus ulcer of sacral region, stage 2 ([place])  Wound care consult for care during stay.  Hypertension, benign  Cont coreg, added Norvasc for better control.  Diabetes mellitus ([place]) - Cont home semglee 5u BID and 3u mealtime novolog TID, monitor and adjust  Lab Results  Component Value Date   HGBA1C 12.9 (H) 08/05/2022   CBG (last 3)  Recent Labs    09/27/22 1818 09/28/22 0203 09/28/22 0820  GLUCAP 214* 157* 186*          Condition - Fair  Family Communication  :  daughter Heather [name] [phone] 09/28/22  Code Status :  Full  Consults  :  Neuro  PUD Prophylaxis : PPI   Procedures  :     EEG - Non acute  MRI - 1. No acute  intracranial abnormality. 2. 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 No  Diet effective now                    Inpatient Medications  Scheduled Meds:  amLODipine  10 mg Oral Daily   atorvastatin  80 mg Oral QHS   carvedilol  6.25 mg Oral BID WC   clopidogrel  75 mg Oral Daily   dronabinol  2.5 mg Oral BID AC   escitalopram  10 mg Oral Daily   insulin aspart  3 Units Subcutaneous TID WC   insulin glargine-yfgn  5 Units Subcutaneous BID   metroNIDAZOLE  500 mg Oral Q12H   mirtazapine  7.5 mg Oral QHS   mometasone-formoterol  2 puff Inhalation BID   multivitamin with minerals  1 tablet Oral Daily   pantoprazole  40 mg Oral Daily   polyethylene glycol  17 g Oral Daily   Continuous Infusions:  ceFEPime (MAXIPIME) IV Stopped (09/28/22 0334)   PRN Meds:.acetaminophen **OR** acetaminophen, albuterol, fentaNYL (SUBLIMAZE) injection, fluticasone, ondansetron **OR** ondansetron (ZOFRAN) IV, traZODone  Antibiotics  :    Anti-infectives (From admission, onward)    Start     Dose/Rate Route Frequency Ordered Stop   09/28/22 0200  ceFEPIme (MAXIPIME) 2 g in sodium chloride 0.9 % 100 mL IVPB        2 g 200 mL/hr over 30 Minutes  Intravenous Every 12 hours 09/27/22 2159     09/27/22 2200  metroNIDAZOLE (FLAGYL) tablet 500 mg        500 mg Oral Every 12 hours 09/27/22 2159           Objective:   Vitals:   09/28/22 0241 09/28/22 0317 09/28/22 0400 09/28/22 0825  BP:  132/72 (!) 160/74 (!) 186/72  Pulse:  84 81 88  Resp:  '16 15 13  '$ Temp: 98.3 F (36.8 C) 98.3 F (36.8 C)    TempSrc: Oral Oral    SpO2:   94% 96%  Weight:  55.6 kg    Height:  5' (1.524 m)      Wt  Readings from Last 3 Encounters:  09/28/22 55.6 kg  09/21/22 55.4 kg  08/03/22 58.7 kg    No intake or output data in the 24 hours ending 09/28/22 0925   Physical Exam  Awake Alert, No new F.N deficits, Normal affect, right groin wound VAC in place, left arm PICC line, [place].AT,PERRAL Supple Neck, No JVD,   Symmetrical Chest wall movement, Good air movement bilaterally, CTAB RRR,No Gallops,Rubs or new Murmurs,  +ve B.Sounds, Abd Soft, No tenderness,   No Cyanosis, Clubbing or edema     RN pressure injury documentation: Pressure Injury 08/04/22 Sacrum Stage 2 -  Partial thickness loss of dermis presenting as a shallow open injury with a red, pink wound bed without slough. (Active)  08/04/22 0100  Location: Sacrum  Location Orientation:   Staging: Stage 2 -  Partial thickness loss of dermis presenting as a shallow open injury with a red, pink wound bed without slough.  Wound Description (Comments):   Present on Admission: Yes  Dressing Type Foam - Lift dressing to assess site every shift 09/22/22 1100      Data Review:    Recent Labs  Lab 09/22/22 0240 09/27/22 1647 09/28/22 0208  WBC 8.6 6.4 7.1  HGB 9.1* 10.7* 10.7*  HCT 27.8* 32.5* 33.4*  PLT 550* 503* 483*  MCV 92.4 95.3 94.6  MCH 30.2 31.4 30.3  MCHC 32.7 32.9 32.0  RDW 15.9* 16.9* 16.9*  LYMPHSABS 1.2 1.1  --   MONOABS 1.0 0.8  --   EOSABS 0.3 0.4  --   BASOSABS 0.1 0.1  --     Recent Labs  Lab 09/22/22 0240 09/27/22 1647 09/27/22 1648 09/28/22 0208  NA 135 135  --  135  K 3.9 3.9  --  3.5  CL 103 103  --  103  CO2 25 23  --  23  ANIONGAP 7 9  --  9  GLUCOSE 160* 246*  --  178*  BUN 11 9  --  9  CREATININE 0.73 0.66  --  0.65  AST 13* 19  --  16  ALT <5 8  --  8  ALKPHOS 64 67  --  61  BILITOT 0.5 0.4  --  0.4  ALBUMIN 2.1* 2.4*  --  2.5*  LATICACIDVEN  --   --  0.9  --   TSH  --   --   --  1.258  AMMONIA  --   --  15  --   MG 1.8  --   --   --   CALCIUM 8.8* 9.1  --  9.2   Lab Results   Component Value Date   HGBA1C 12.9 (H) 08/05/2022    Recent Labs    09/28/22 0208  TSH 1.258  Radiology Reports EEG adult  Result Date: 09/28/2022 Lora Havens, MD     09/28/2022  8:37 AM Patient Name: Darrelle Barrell MRN: [id number] Epilepsy Attending: Lora Havens Referring Physician/Provider: Lorenza Chick, MD Date: 09/28/2022 Duration: 29.41 mins Patient history: 72yo M with ams. EEG to evaluate for seizure. Level of alertness: Awake, asleep AEDs during EEG study: None Technical aspects: This EEG study was done with scalp electrodes positioned according to the 10-20 International system of electrode placement. Electrical activity was reviewed with band pass filter of 1-'70Hz'$ , sensitivity of 7 uV/mm, display speed of 27m/sec with a '60Hz'$  notched filter applied as appropriate. EEG data were recorded continuously and digitally stored.  Video monitoring was available and reviewed as appropriate. Description: The posterior dominant rhythm consists of 7.5 Hz activity of moderate voltage (25-35 uV) seen predominantly in posterior head regions, symmetric and reactive to eye opening and eye closing. Sleep was characterized by vertex waves, sleep spindles (12 to 14 Hz), maximal frontocentral region. EEG showed intermittent generalized polymorphic sharply contoured 3 to 6 Hz theta-delta slowing, at times with triphasic morphology . Hyperventilation and photic stimulation were not performed.   ABNORMALITY - Intermittent slow, generalized IMPRESSION: This study is suggestive of mild to moderate diffuse encephalopathy, nonspecific etiology but could be related to toxic-metabolic causes. No seizures or definite epileptiform discharges were seen throughout the recording. PLora Havens  MR BRAIN WO CONTRAST  Result Date: 09/27/2022 CLINICAL DATA:  Altered mental status EXAM: MRI HEAD WITHOUT CONTRAST TECHNIQUE: Multiplanar, multiecho pulse sequences of the brain and surrounding structures were  obtained without intravenous contrast. COMPARISON:  08/04/2022 FINDINGS: Brain: No acute infarct, mass effect or extra-axial collection. Old blood products at the midline posterior fossa. There is multifocal hyperintense T2-weighted signal within the white matter. Generalized volume loss. The midline structures are normal. 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 Vascular: Normal flow voids. Skull and upper cervical spine: Normal marrow signal. Sinuses/Orbits: Bilateral mastoid fluid. Paranasal sinuses are clear. Bilateral ocular lens replacements. Other: None IMPRESSION: 1. No acute intracranial abnormality. 2. 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 Electronically Signed   By: KUlyses JarredM.D.   On: 09/27/2022 23:32   DG Chest 2 View  Result Date: 09/27/2022 CLINICAL DATA:  Altered level of consciousness EXAM: CHEST - 2 VIEW COMPARISON:  09/18/2022 FINDINGS: Frontal and lateral views of the chest demonstrates stable aortic valve prosthesis. Left-sided PICC, tip in the region of the superior vena cava. The cardiac silhouette is unremarkable. No airspace disease, effusion, or pneumothorax. No acute bony abnormalities. IMPRESSION: 1. No acute intrathoracic process. Electronically Signed   By: MRanda NgoM.D.   On: 09/27/2022 17:46   CT Head Wo Contrast  Result Date: 09/27/2022 CLINICAL DATA:  Mental status change. EXAM: CT HEAD WITHOUT CONTRAST TECHNIQUE: Contiguous axial images were obtained from the base of the skull through the vertex without intravenous contrast. RADIATION DOSE REDUCTION: This exam was performed according to the departmental dose-optimization program which includes automated exposure control, adjustment of the mA and/or kV according to patient size and/or use of iterative reconstruction technique. COMPARISON:  August 16, 2022 FINDINGS: Brain: No evidence of acute hemorrhage, hydrocephalus, extra-axial collection or mass  lesion/mass effect. Moderate brain parenchymal volume loss and deep white matter microangiopathy. Small areas of hypoattenuation in the right cerebellum with uncertain significance. Vascular: No hyperdense vessel or unexpected calcification. Skull: Normal. Negative for fracture or focal lesion. Sinuses/Orbits: Mucosal thickening of the  right maxillary sinus. Other: None. IMPRESSION: No evidence of acute hemorrhage. Small areas of hypoattenuation in the right cerebellum with uncertain significance. These may represent age-indeterminate infarcts. Moderate brain parenchymal volume loss and deep white matter microangiopathy. Electronically Signed   By: Fidela Salisbury M.D.   On: 09/27/2022 17:44      Signature  -   Lala Lund M.D on 09/28/2022 at 9:25 AM   -  To page go to [email or web address]

## 2022-09-28 NOTE — ED Notes (Signed)
ED TO INPATIENT HANDOFF REPORT  ED Nurse Name and Phone #:   [phone] Name/Age/Gender Heather [name] 72 y.o. female Room/Bed: 019C/019C  Code Status   Code Status: Full Code  Home/SNF/Other Home Patient oriented to: self, place, time, and situation Is this baseline? Yes   Triage Complete: Triage complete  Chief Complaint Acute encephalopathy [G93.40]  Triage Note Pt arrived via [facility] from [facility] for AMS and slow to respond. Per EMS, pt has pressure ulcers on buttocks. Pt is A&Ox2 to self and place.   Allergies Allergies  Allergen Reactions   Iodinated Contrast Media Hives   Sulfa Antibiotics Hives   Shellfish Allergy Nausea And Vomiting and Rash    Scallops specifically     Level of Care/Admitting Diagnosis ED Disposition     ED Disposition  Admit   Condition  --   Comment  Hospital Area: [facility] [[id number]]  Level of Care: Med-Surg [[id number]]  May place patient in observation at [facility] or [facility] if equivalent level of care is available:: No  Covid Evaluation: Recent COVID positive no isolation required infection day 21-90  Diagnosis: Acute encephalopathy [951884]  Admitting Physician: [name] [[id number]]  Attending Physician: [name] [[id number]]          B Medical/Surgery History Past Medical History:  Diagnosis Date   (HFpEF) heart failure with preserved ejection fraction (HCC)    Angina pectoris ([place])    Anxiety    Aortic atherosclerosis ([place])    Basal cell carcinoma    Bilateral carotid artery disease ([place])    a.) carotid doppler 03/18/2021: 6-30% RICA, 16-01% LICA   Bipolar disorder (HCC)    Cervicalgia    Chronic mesenteric ischemia ([place])    a.) s/p PTA 11/13/2020 --> 6 x 16 mm lifestream ballon expanding stent to SMA   COPD (chronic obstructive pulmonary disease) ([place])    Coronary artery disease 03/20/2014   a.) LHC/PCI 03/20/2014: 70% mLAD (2.5 x 28 mm Xience Alpine DES), 70% dLCx, 70% dRCA; b.) LHC  01/25/2016: 90% ISR mLAD --> mLAD dissection with in stent --> 2.75 x 22 mm Resolute DES; c.) R/LHC 09/29/2016: 50% ISR mLAD, 70% dLAD, mPA 37, PCWP 24, AO sat 90%, PA sat 46%, CO 3.7, CI 2.46 - med mgmt   DDD (degenerative disc disease), cervical    Depression    Diabetic polyneuropathy (HCC)    GERD (gastroesophageal reflux disease)    Hyperlipidemia    Hypertension    Long term current use of antithrombotics/antiplatelets    a.) on DAPT (ASA + clopidogrel)   Memory loss    NSTEMI (non-ST elevated myocardial infarction) ([place]) 07/2016   a.) troponins trended (normal < 0.034 ng/mL): 0.325 --> 1.480 --> 2.330 --> 1.660 --> 0.169 ng/mL   PAD (peripheral artery disease) ([place])    a.) s/p PTA and stenting SMA 11/13/2020; b.) s/p PTA and stenting of RIGHT SFA and popliteal 04/12/2021; c.) s/p PTA and kissing balloon stents to BILATERAL CIAs 05/28/2021   Respiratory failure with hypoxia (HCC)    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   a.) 23 mm Sapien 3 via suprasternal approach   Schizophrenia ([place])    Severe aortic stenosis    a.) s/p TAVR 09/30/2016; 23 mm Sapien 3 via a suprasternal approach   Type 2 diabetes mellitus treated with insulin ([place])    Vertigo    Past Surgical History:  Procedure Laterality Date   AMPUTATION Left 08/14/2022   Procedure:  ABOVE KNEE AMPUTATION;  Surgeon: Waynetta Sandy, MD;  Location: [facility];  Service: Vascular;  Laterality: Left;   AORTIC VALVE REPLACEMENT  09/30/2016   Procedure: TRANSCATHETER AORTIC VALVE REPAIR   APPLICATION OF WOUND VAC Bilateral 08/14/2022   Procedure: APPLICATION OF WOUND VAC;  Surgeon: Waynetta Sandy, MD;  Location: [facility];  Service: Vascular;  Laterality: Bilateral;   BLADDER SURGERY     CATARACT EXTRACTION, BILATERAL     CHOLECYSTECTOMY     COLONOSCOPY     CORONARY ANGIOPLASTY WITH STENT PLACEMENT  03/20/2014   Procedure: CORONARY ANGIOPLASTY WITH STENT PLACEMENT; Location: [facility]; Surgeon:  Jacques Earthly, MD   CORONARY ANGIOPLASTY WITH STENT PLACEMENT Left 01/25/2016   Procedure: CORONARY ANGIOPLASTY WITH STENT PLACEMENT; Location: [facility]; Surgeon: Jacques Earthly, MD   ENDARTERECTOMY FEMORAL Bilateral 08/04/2022   Procedure: BILATERAL FEMORAL ENDARTERECTOMY;  Surgeon: Waynetta Sandy, MD;  Location: [facility];  Service: Vascular;  Laterality: Bilateral;   FASCIECTOMY Bilateral 08/04/2022   Procedure: BILATERAL LOWER EXTREMITY FASCIECTOMIES;  Surgeon: Waynetta Sandy, MD;  Location: [facility];  Service: Vascular;  Laterality: Bilateral;   FOOT SURGERY Right    GROIN DEBRIDEMENT Bilateral 08/14/2022   Procedure: GROIN DEBRIDEMENT;  Surgeon: Waynetta Sandy, MD;  Location: [facility];  Service: Vascular;  Laterality: Bilateral;   GROIN DEBRIDEMENT Bilateral 09/16/2022   Procedure: GROIN DEBRIDEMENT POSSIBLE VAC PLACMENT;  Surgeon: Broadus John, MD;  Location: [facility];  Service: Vascular;  Laterality: Bilateral;   HEMORRHOID SURGERY     INSERTION OF ILIAC STENT Bilateral 08/04/2022   Procedure: INSERTION OF BILATERAL ILIAC STENTS;  Surgeon: Waynetta Sandy, MD;  Location: [facility];  Service: Vascular;  Laterality: Bilateral;   IRRIGATION AND DEBRIDEMENT HEMATOMA Left 07/08/2014   GROIN   LOWER EXTREMITY ANGIOGRAM Bilateral 08/04/2022   Procedure: LOWER EXTREMITY ANGIOGRAM;  Surgeon: Waynetta Sandy, MD;  Location: [facility];  Service: Vascular;  Laterality: Bilateral;   LOWER EXTREMITY ANGIOGRAPHY Right 04/12/2021   Procedure: Lower Extremity Angiography;  Surgeon: [name], MD;  Location: [facility];  Service: Cardiovascular;  Laterality: Right;   LOWER EXTREMITY ANGIOGRAPHY Left 05/28/2021   Procedure: LOWER EXTREMITY ANGIOGRAPHY;  Surgeon: [name], MD;  Location: [facility];  Service: Cardiovascular;  Laterality: Left;   LOWER EXTREMITY ANGIOGRAPHY Left 03/25/2022   Procedure: Lower Extremity Angiography;   Surgeon: [name], MD;  Location: [facility];  Service: Cardiovascular;  Laterality: Left;   PATCH ANGIOPLASTY  08/04/2022   Procedure: LEFT FEMORAL PATCH ANGIOPLASTY USING XENOSURE BIOLOGIC PATCH;  Surgeon: Waynetta Sandy, MD;  Location: [facility];  Service: Vascular;;   PSEUDOANEURYSM REPAIR Left 06/30/2014   FEMORAL ARTERY   RIGHT/LEFT HEART CATH AND CORONARY ANGIOGRAPHY Bilateral 09/29/2016   Procedure: RIGHT/LEFT HEART CATH AND CORONARY ANGIOGRAPHY; Location [facility]; Surgeon: Jacques Earthly, MD   THROMBECTOMY FEMORAL ARTERY Bilateral 08/04/2022   Procedure: BILATERAL LOWER EXTREMITY THROMBECTOMY;  Surgeon: Waynetta Sandy, MD;  Location: [facility];  Service: Vascular;  Laterality: Bilateral;   TUBAL LIGATION     VISCERAL ANGIOGRAPHY N/A 11/13/2020   Procedure: VISCERAL ANGIOGRAPHY;  Surgeon: [name], MD;  Location: [facility];  Service: Cardiovascular;  Laterality: N/A;     A IV Location/Drains/Wounds Patient Lines/Drains/Airways Status     Active Line/Drains/Airways     Name Placement date Placement time Site Days   PICC Double Lumen 67/34/19 Left Basilic 40 cm 0 cm 37/90/24  2100  -- 9  Negative Pressure Wound Therapy Groin Bilateral 09/16/22  1134  --  12   External Urinary Catheter 09/22/22  0929  --  6   External Urinary Catheter 09/22/22  1130  --  6   Pressure Injury 08/04/22 Sacrum Stage 2 -  Partial thickness loss of dermis presenting as a shallow open injury with a red, pink wound bed without slough. 08/04/22  0100  -- 55   Wound / Incision (Open or Dehisced) 08/30/22 Non-pressure wound Buttocks Left stage II on crease of buttocks 08/30/22  0800  Buttocks  29            Intake/Output Last 24 hours No intake or output data in the 24 hours ending 09/28/22 0132  Labs/Imaging Results for orders placed or performed during the hospital encounter of 09/27/22 (from the past 48 hour(s))  CBC with Differential     Status:  Abnormal   Collection Time: 09/27/22  4:47 PM  Result Value Ref Range   WBC 6.4 4.0 - 10.5 K/uL   RBC 3.41 (L) 3.87 - 5.11 MIL/uL   Hemoglobin 10.7 (L) 12.0 - 15.0 g/dL   HCT 32.5 (L) 36.0 - 46.0 %   MCV 95.3 80.0 - 100.0 fL   MCH 31.4 26.0 - 34.0 pg   MCHC 32.9 30.0 - 36.0 g/dL   RDW 16.9 (H) 11.5 - 15.5 %   Platelets 503 (H) 150 - 400 K/uL   nRBC 0.0 0.0 - 0.2 %   Neutrophils Relative % 62 %   Neutro Abs 4.0 1.7 - 7.7 K/uL   Lymphocytes Relative 18 %   Lymphs Abs 1.1 0.7 - 4.0 K/uL   Monocytes Relative 12 %   Monocytes Absolute 0.8 0.1 - 1.0 K/uL   Eosinophils Relative 6 %   Eosinophils Absolute 0.4 0.0 - 0.5 K/uL   Basophils Relative 1 %   Basophils Absolute 0.1 0.0 - 0.1 K/uL   Immature Granulocytes 1 %   Abs Immature Granulocytes 0.06 0.00 - 0.07 K/uL    Comment: Performed at [facility], [address], [place], [place] [address]  Comprehensive metabolic panel     Status: Abnormal   Collection Time: 09/27/22  4:47 PM  Result Value Ref Range   Sodium 135 135 - 145 mmol/L   Potassium 3.9 3.5 - 5.1 mmol/L   Chloride 103 98 - 111 mmol/L   CO2 23 22 - 32 mmol/L   Glucose, Bld 246 (H) 70 - 99 mg/dL    Comment: Glucose reference range applies only to samples taken after fasting for at least 8 hours.   BUN 9 8 - 23 mg/dL   Creatinine, Ser 0.66 0.44 - 1.00 mg/dL   Calcium 9.1 8.9 - 10.3 mg/dL   Total Protein 6.1 (L) 6.5 - 8.1 g/dL   Albumin 2.4 (L) 3.5 - 5.0 g/dL   AST 19 15 - 41 U/L   ALT 8 0 - 44 U/L   Alkaline Phosphatase 67 38 - 126 U/L   Total Bilirubin 0.4 0.3 - 1.2 mg/dL   GFR, Estimated >60 >60 mL/min    Comment: (NOTE) Calculated using the CKD-EPI Creatinine Equation (2021)    Anion gap 9 5 - 15    Comment: Performed at [facility], [place] [address], [place], [place] [address]  Urinalysis, Routine w reflex microscopic -Urine, Catheterized     Status: Abnormal   Collection Time: 09/27/22  4:47 PM  Result Value Ref Range   Color, Urine  YELLOW  YELLOW   APPearance HAZY (A) CLEAR   Specific Gravity, Urine 1.017 1.005 - 1.030   pH 5.0 5.0 - 8.0   Glucose, UA 150 (A) NEGATIVE mg/dL   Hgb urine dipstick SMALL (A) NEGATIVE   Bilirubin Urine NEGATIVE NEGATIVE   Ketones, ur NEGATIVE NEGATIVE mg/dL   Protein, ur 100 (A) NEGATIVE mg/dL   Nitrite NEGATIVE NEGATIVE   Leukocytes,Ua NEGATIVE NEGATIVE   RBC / HPF 0-5 0 - 5 RBC/hpf   WBC, UA 0-5 0 - 5 WBC/hpf   Bacteria, UA NONE SEEN NONE SEEN   Squamous Epithelial / HPF 0-5 0 - 5 /HPF   Mucus PRESENT     Comment: Performed at [facility], [place] [address] [place], [place] [address]  Ammonia     Status: None   Collection Time: 09/27/22  4:48 PM  Result Value Ref Range   Ammonia 15 9 - 35 umol/L    Comment: Performed at [facility], [place] [address] [place], [place] [address]  Lactic acid, plasma     Status: None   Collection Time: 09/27/22  4:48 PM  Result Value Ref Range   Lactic Acid, Venous 0.9 0.5 - 1.9 mmol/L    Comment: Performed at [facility] [address] [place], [place] [address]  CBG monitoring, ED     Status: Abnormal   Collection Time: 09/27/22  6:18 PM  Result Value Ref Range   Glucose-Capillary 214 (H) 70 - 99 mg/dL    Comment: Glucose reference range applies only to samples taken after fasting for at least 8 hours.   MR BRAIN WO CONTRAST  Result Date: 09/27/2022 CLINICAL DATA:  Altered mental status EXAM: MRI HEAD WITHOUT CONTRAST TECHNIQUE: Multiplanar, multiecho pulse sequences of the brain and surrounding structures were obtained without intravenous contrast. COMPARISON:  08/04/2022 FINDINGS: Brain: No acute infarct, mass effect or extra-axial collection. Old blood products at the midline posterior fossa. There is multifocal hyperintense T2-weighted signal within the white matter. Generalized volume loss. The midline structures are normal. 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 Vascular: Normal flow voids. Skull and  upper cervical spine: Normal marrow signal. Sinuses/Orbits: Bilateral mastoid fluid. Paranasal sinuses are clear. Bilateral ocular lens replacements. Other: None IMPRESSION: 1. No acute intracranial abnormality. 2. 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 Electronically Signed   By: Ulyses Jarred M.D.   On: 09/27/2022 23:32   DG Chest 2 View  Result Date: 09/27/2022 CLINICAL DATA:  Altered level of consciousness EXAM: CHEST - 2 VIEW COMPARISON:  09/18/2022 FINDINGS: Frontal and lateral views of the chest demonstrates stable aortic valve prosthesis. Left-sided PICC, tip in the region of the superior vena cava. The cardiac silhouette is unremarkable. No airspace disease, effusion, or pneumothorax. No acute bony abnormalities. IMPRESSION: 1. No acute intrathoracic process. Electronically Signed   By: Randa Ngo M.D.   On: 09/27/2022 17:46   CT Head Wo Contrast  Result Date: 09/27/2022 CLINICAL DATA:  Mental status change. EXAM: CT HEAD WITHOUT CONTRAST TECHNIQUE: Contiguous axial images were obtained from the base of the skull through the vertex without intravenous contrast. RADIATION DOSE REDUCTION: This exam was performed according to the departmental dose-optimization program which includes automated exposure control, adjustment of the mA and/or kV according to patient size and/or use of iterative reconstruction technique. COMPARISON:  August 16, 2022 FINDINGS: Brain: No evidence of acute hemorrhage, hydrocephalus, extra-axial collection or mass lesion/mass effect. Moderate  brain parenchymal volume loss and deep white matter microangiopathy. Small areas of hypoattenuation in the right cerebellum with uncertain significance. Vascular: No hyperdense vessel or unexpected calcification. Skull: Normal. Negative for fracture or focal lesion. Sinuses/Orbits: Mucosal thickening of the right maxillary sinus. Other: None. IMPRESSION: No evidence of acute hemorrhage. Small areas of  hypoattenuation in the right cerebellum with uncertain significance. These may represent age-indeterminate infarcts. Moderate brain parenchymal volume loss and deep white matter microangiopathy. Electronically Signed   By: Fidela Salisbury M.D.   On: 09/27/2022 17:44    Pending Labs Unresulted Labs (From admission, onward)     Start     Ordered   09/28/22 0500  CBC  Tomorrow morning,   R        09/28/22 0101   09/28/22 0500  Comprehensive metabolic panel  Tomorrow morning,   R        09/28/22 0101   09/28/22 0043  Vitamin B12  Once,   URGENT        09/28/22 0042   09/28/22 0043  Vitamin B1  Once,   URGENT        09/28/22 0042   09/28/22 0043  TSH  Once,   URGENT        09/28/22 0042   09/28/22 0043  RPR  Once,   URGENT        09/28/22 0042   09/28/22 0043  HIV Antibody (routine testing w rflx)  (HIV Antibody (Routine testing w reflex) panel)  Once,   URGENT        09/28/22 0042   09/27/22 1647  CBC with Differential  (ED ALOC)  Once,   STAT        09/27/22 1648            Vitals/Pain Today's Vitals   09/27/22 2109 09/27/22 2200 09/27/22 2220 09/28/22 0030  BP:  (!) 168/75  (!) 145/66  Pulse:    75  Resp:    12  Temp:      TempSrc:      SpO2:  98%  99%  Weight:      Height:      PainSc: 8   6      Isolation Precautions No active isolations  Medications Medications  ceFEPIme (MAXIPIME) 2 g in sodium chloride 0.9 % 100 mL IVPB (has no administration in time range)  metroNIDAZOLE (FLAGYL) tablet 500 mg (500 mg Oral Given 09/27/22 2216)  carvedilol (COREG) tablet 6.25 mg (has no administration in time range)  atorvastatin (LIPITOR) tablet 80 mg (has no administration in time range)  albuterol (PROVENTIL) (2.5 MG/3ML) 0.083% nebulizer solution 3 mL (has no administration in time range)  clopidogrel (PLAVIX) tablet 75 mg (has no administration in time range)  dronabinol (MARINOL) capsule 2.5 mg (has no administration in time range)  escitalopram (LEXAPRO) tablet 10 mg  (has no administration in time range)  fluticasone (FLONASE) 50 MCG/ACT nasal spray 1 spray (has no administration in time range)  mometasone-formoterol (DULERA) 200-5 MCG/ACT inhaler 2 puff (has no administration in time range)  insulin glargine-yfgn (SEMGLEE) injection 5 Units (has no administration in time range)  insulin aspart (novoLOG) injection 3 Units (has no administration in time range)  mirtazapine (REMERON) tablet 7.5 mg (has no administration in time range)  multivitamin with minerals tablet 1 tablet (has no administration in time range)  pantoprazole (PROTONIX) EC tablet 40 mg (has no administration in time range)  polyethylene glycol (MIRALAX / GLYCOLAX) packet 17 g (has  no administration in time range)  traZODone (DESYREL) tablet 25 mg (has no administration in time range)  ondansetron (ZOFRAN) tablet 4 mg (has no administration in time range)    Or  ondansetron (ZOFRAN) injection 4 mg (has no administration in time range)  acetaminophen (TYLENOL) tablet 650 mg (has no administration in time range)    Or  acetaminophen (TYLENOL) suppository 650 mg (has no administration in time range)  acetaminophen (TYLENOL) tablet 650 mg (650 mg Oral Given 09/27/22 1813)  LORazepam (ATIVAN) injection 0.5 mg (0.5 mg Intravenous Given 09/27/22 2226)  fentaNYL (SUBLIMAZE) injection 50 mcg (50 mcg Intravenous Given 09/27/22 2109)    Mobility walks     Focused Assessments Neuro Assessment Handoff:  Swallow screen pass? Yes  Cardiac Rhythm: Normal sinus rhythm       Neuro Assessment: Exceptions to WDL Neuro Checks:      Has TPA been given? No If patient is a Neuro Trauma and patient is going to OR before floor call report to [place] nurse: [phone] or [phone]   R Recommendations: See Admitting Provider Note  Report given to:   Additional Notes wound vac to to ABD, Decub to sacrum

## 2022-09-28 NOTE — Assessment & Plan Note (Signed)
Infected B groin wounds Continue cefepime + flagyl Wound care consult for wound vac management.

## 2022-09-29 ENCOUNTER — Other Ambulatory Visit: Payer: Self-pay | Admitting: *Deleted

## 2022-09-29 DIAGNOSIS — I739 Peripheral vascular disease, unspecified: Secondary | ICD-10-CM

## 2022-09-29 DIAGNOSIS — G9340 Encephalopathy, unspecified: Secondary | ICD-10-CM | POA: Diagnosis not present

## 2022-09-29 LAB — GLUCOSE, CAPILLARY
Glucose-Capillary: 229 mg/dL — ABNORMAL HIGH (ref 70–99)
Glucose-Capillary: 310 mg/dL — ABNORMAL HIGH (ref 70–99)
Glucose-Capillary: 160 mg/dL — ABNORMAL HIGH (ref 70–99)
Glucose-Capillary: 108 mg/dL — ABNORMAL HIGH (ref 70–99)

## 2022-09-29 MED ORDER — INSULIN ASPART 100 UNIT/ML FLEXPEN: PEN_INJECTOR | SUBCUTANEOUS | 0 refills | Status: DC

## 2022-09-29 MED ORDER — HYDRALAZINE HCL 50 MG PO TABS: 50.0000 mg | ORAL_TABLET | Freq: Three times a day (TID) | ORAL | 0 refills | Status: AC

## 2022-09-29 MED ORDER — INSULIN ASPART 100 UNIT/ML IJ SOLN: 0.0000 [IU] | Freq: Every day | INTRAMUSCULAR | Status: DC

## 2022-09-29 MED ORDER — INSULIN GLARGINE 100 UNIT/ML ~~LOC~~ SOLN: 10.0000 [IU] | Freq: Two times a day (BID) | SUBCUTANEOUS | 0 refills | Status: DC

## 2022-09-29 MED ORDER — AMLODIPINE BESYLATE 10 MG PO TABS: 10.0000 mg | ORAL_TABLET | Freq: Every day | ORAL | 0 refills | Status: DC

## 2022-09-29 MED ORDER — INSULIN GLARGINE-YFGN 100 UNIT/ML ~~LOC~~ SOLN
10.0000 [IU] | Freq: Two times a day (BID) | SUBCUTANEOUS | Status: DC
  Administered 2022-09-29 – 2022-09-30 (×3): 10 [IU] via SUBCUTANEOUS
  Filled 2022-09-29 (×4): qty 0.1

## 2022-09-29 MED ORDER — INSULIN ASPART 100 UNIT/ML IJ SOLN
0.0000 [IU] | Freq: Three times a day (TID) | INTRAMUSCULAR | Status: DC
  Administered 2022-09-29: 11 [IU] via SUBCUTANEOUS
  Administered 2022-09-29: 6 [IU] via SUBCUTANEOUS
  Administered 2022-09-30: 3 [IU] via SUBCUTANEOUS

## 2022-09-29 MED ORDER — ACETAMINOPHEN 325 MG PO TABS: 650.0000 mg | ORAL_TABLET | Freq: Four times a day (QID) | ORAL | Status: DC | PRN

## 2022-09-29 MED ORDER — INSULIN GLARGINE-YFGN 100 UNIT/ML ~~LOC~~ SOLN
8.0000 [IU] | Freq: Two times a day (BID) | SUBCUTANEOUS | Status: DC
  Filled 2022-09-29 (×2): qty 0.08

## 2022-09-29 NOTE — Progress Notes (Addendum)
CSW spoke with Raquel Sarna at [facility] who states the patient can return to the facility once a new insurance authorization is obtained.  CSW spoke with CMA to request authorization request be submitted.  Madilyn Fireman, MSW, LCSW Transitions of Care  Clinical Social Worker II [phone]

## 2022-09-29 NOTE — Plan of Care (Signed)
Patient is discharging to skilled facility today.

## 2022-09-29 NOTE — Consult Note (Signed)
[facility] Nurse Consult Note: Reason for Consult:In to assess patient this AM.  Has SNF VAC (unfamiliar with brand) in place.  Discharging to SNF once authorization number is obtained.  Will implement NS moist gauze dressing today.  Bedside RN to perform.  Wound type: surgical wounds Pressure Injury POA: NA Dressing procedure/placement/frequency: Bedside RN to remove facility device and place NS moist gauze to wound bed and cover with dry dressing and tape.  VAC to be reapplied when she gets back to facility as they will assess the wounds upon readmission.  Will not follow at this time.  Please re-consult if needed.  Estrellita Ludwig MSN, RN, FNP-BC CWON Wound, Ostomy, Continence Nurse [facility] [phone] Pager [phone]

## 2022-09-29 NOTE — Discharge Instructions (Signed)
Follow with Primary MD [name], FNP in 7 days   Get CBC, CMP, Magnesium -  checked next visit with your primary MD or SNF MD   Activity: As tolerated with Full fall precautions use walker/cane & assistance as needed  Disposition SNF  Diet: Heart Healthy - Low Carb, check CBGs q. [facility]  Special Instructions: If you have smoked or chewed Tobacco  in the last 2 yrs please stop smoking, stop any regular Alcohol  and or any Recreational drug use.  On your next visit with your primary care physician please Get Medicines reviewed and adjusted.  Please request your Prim.MD to go over all Hospital Tests and Procedure/Radiological results at the follow up, please get all Hospital records sent to your Prim MD by signing hospital release before you go home.  If you experience worsening of your admission symptoms, develop shortness of breath, life threatening emergency, suicidal or homicidal thoughts you must seek medical attention immediately by calling 911 or calling your MD immediately  if symptoms less severe.  You Must read complete instructions/literature along with all the possible adverse reactions/side effects for all the Medicines you take and that have been prescribed to you. Take any new Medicines after you have completely understood and accpet all the possible adverse reactions/side effects.

## 2022-09-29 NOTE — Discharge Summary (Signed)
Heather [name] SPQ:[id number] DOB: 1951/05/26 DOA: 09/27/2022  PCP: [name], FNP  Admit date: 09/27/2022  Discharge date: 09/30/2022  Admitted From: SNF   Disposition:  SNF   Recommendations for Outpatient Follow-up:   Follow up with PCP in 1-2 weeks  PCP Please obtain BMP/CBC, 2 view CXR in 1week,  (see Discharge instructions)   PCP Please follow up on the following pending results: Monitor blood pressure and CBGs closely.   Home Health: None   Equipment/Devices: None  Consultations: None  Discharge Condition: Stable    CODE STATUS: Full    Diet Recommendation: Heart Healthy Low Carb, check CBGs q. ACH S.  Chief Complaint  Patient presents with   Altered Mental Status     Brief history of present illness from the day of admission and additional interim summary    72 y.o. female with medical history significant of DM-2, diastolic CHF, COPD, HTN, HLD, GERD, PAD, AS s/p TAVR, tobacco use disorder, anxiety, depression and schizophrenia.    Pt with recent almost 2 month long hospital stay from 08/04/22-09/22/22.  Pt presented to ED on 12/11 with SBP in 200s, dizziness, headache and blurry vision, and admitted to ICU for hemorrhagic cerebral stroke and BLE ischemia.  She underwent BLE femoral endarterectomy and patch angioplasty with iliac stenting and 4 compartment fasciotomies.  Postop, she remained on vent and Cleviprex.  Eventually, she was extubated and transferred to hospitalist service on 12/17.  She also underwent left AKA and excisional debridement of bilateral groin wounds with VAC placement on 12/21.  Hospital course and then noteworthy for episode of hypoglycemia with insulin adjustments.  Initially required tube feed that was later discontinued.  Remains physically deconditioned.  Wound VAC discontinued on  1/18.  She is now WTD dressing with silver to bilateral groins. Concern about persistent or worsening greenish drainage with tunneling.  Underwent groin debridement and wound VAC placement on 1/23.  Antibiotics broadened to IV cefepime and Flagyl per ID and VVS.  Bilateral wound vacs in place.  Plan for 6 weeks abx. Hospitalization complicated by COVID 19 infection, symptoms resolved at time of discharge.    Following DC, pt in SNF. Today patients family reportedly went to visit pt in SNF, found her soaking wet with heat in room set to 85 degrees, she appeared confused and weak, she was brought to the ER where she was seen by neurology and admitted for encephalopathy and weakness evaluation.                                                                 Hospital Course   Acute metabolic encephalopathy.  Likely caused by dehydration due to exposure to hot temperatures at SNF, also according to the daughter patient is not receiving appropriate dietary restrictions at SNF and her blood  pressure and sugars have been running very high.  Thankfully her workup here which includes MRI brain, EEG, chest x-ray, TSH, ammonia and UA all appears stable, her mentation has improved and she is close to her baseline.  She most likely had metabolic encephalopathy due to poor glycemic control, poorly controlled hypertension and possibly warm temperatures in the SNF room.  With control and blood pressure, CBGs and stable room temperature her mentation is back to normal.  Workup essentially unremarkable.  Will be discharged back to SNF.   Right groin wound - Infected B groin wounds, Continue cefepime + flagyl with stop date per last discharge summary on 10/28/2022, Wound care consult for wound vac management.   Decubitus ulcer of sacral region, stage 2 ([place])  Wound care continue as before unchanged.   Hypertension, benign - was in poor control, she was on Coreg Norvasc and hydralazine added for better control.   Diabetes  mellitus ([place]) - Insulin regimen adjusted for better control check CBGs q. ACH S and continue to adjust insulin regimen at SNF closely.  Lab Results  Component Value Date   HGBA1C 12.9 (H) 08/05/2022    Discharge diagnosis     Principal Problem:   Acute encephalopathy Active Problems:   Right groin wound   Wound of left groin   Diabetes mellitus ([place])   Hypertension, benign   Decubitus ulcer of sacral region, stage 2 [facility])    Discharge instructions    Discharge Instructions     Ambulatory Referral for Lung Cancer Scre   Complete by: As directed    Discharge instructions   Complete by: As directed    Follow with Primary MD [name], FNP in 7 days   Get CBC, CMP, Magnesium -  checked next visit with your primary MD or SNF MD   Activity: As tolerated with Full fall precautions use walker/cane & assistance as needed  Disposition SNF  Diet: Heart Healthy - Low Carb, check CBGs q. [facility]  Special Instructions: If you have smoked or chewed Tobacco  in the last 2 yrs please stop smoking, stop any regular Alcohol  and or any Recreational drug use.  On your next visit with your primary care physician please Get Medicines reviewed and adjusted.  Please request your Prim.MD to go over all Hospital Tests and Procedure/Radiological results at the follow up, please get all Hospital records sent to your Prim MD by signing hospital release before you go home.  If you experience worsening of your admission symptoms, develop shortness of breath, life threatening emergency, suicidal or homicidal thoughts you must seek medical attention immediately by calling 911 or calling your MD immediately  if symptoms less severe.  You Must read complete instructions/literature along with all the possible adverse reactions/side effects for all the Medicines you take and that have been prescribed to you. Take any new Medicines after you have completely understood and accpet all the possible  adverse reactions/side effects.   Discharge wound care:   Complete by: As directed    Continue taking care of right groin wound VAC as before, keep the PICC line site clean and dry at all times   Increase activity slowly   Complete by: As directed    No wound care   Complete by: As directed        Discharge Medications   Allergies as of 09/30/2022       Reactions   Iodinated Contrast Media Hives   Sulfa Antibiotics Hives  Shellfish Allergy Nausea And Vomiting, Rash   Scallops specifically         Medication List     STOP taking these medications    oxyCODONE 5 MG immediate release tablet Commonly known as: Oxy IR/ROXICODONE       TAKE these medications    acetaminophen 325 MG tablet Commonly known as: TYLENOL Take 2 tablets (650 mg total) by mouth every 6 (six) hours as needed for mild pain (or Fever >/= 101).   albuterol 108 (90 Base) MCG/ACT inhaler Commonly known as: VENTOLIN HFA Inhale 2 puffs into the lungs every 4 (four) hours as needed for wheezing or shortness of breath.   amLODipine 10 MG tablet Commonly known as: NORVASC Take 1 tablet (10 mg total) by mouth daily.   atorvastatin 80 MG tablet Commonly known as: LIPITOR Take 80 mg by mouth at bedtime.   carvedilol 6.25 MG tablet Commonly known as: COREG Take 1 tablet (6.25 mg total) by mouth 2 (two) times daily with a meal.   ceFEPime  IVPB Commonly known as: MAXIPIME Inject 2 g into the vein every 8 (eight) hours. Indication:  infected graft  First Dose: Yes Last Day of Therapy:  10/28/22 Labs - Once weekly:  CBC/D and BMP, Labs - Every other week:  ESR and CRP Method of administration: IV Push Remove PICC line at completion of antibiotic therapy Method of administration may be changed at the discretion of home infusion pharmacist based upon assessment of the patient and/or caregiver's ability to self-administer the medication ordered. What changed: additional instructions   cetirizine 10 MG  tablet Commonly known as: ZYRTEC Take 10 mg by mouth daily.   cholecalciferol 25 MCG (1000 UNIT) tablet Commonly known as: VITAMIN D3 Take 1,000 Units by mouth daily.   clopidogrel 75 MG tablet Commonly known as: PLAVIX Take 1 tablet (75 mg total) by mouth daily.   docusate sodium 100 MG capsule Commonly known as: Colace Take 1 capsule (100 mg total) by mouth daily.   dronabinol 2.5 MG capsule Commonly known as: MARINOL Take 1 capsule (2.5 mg total) by mouth 2 (two) times daily before lunch and supper.   escitalopram 10 MG tablet Commonly known as: LEXAPRO Take 1 tablet (10 mg total) by mouth daily.   fluticasone 50 MCG/ACT nasal spray Commonly known as: FLONASE Place 1 spray into both nostrils daily as needed for allergies.   fluticasone-salmeterol 250-50 MCG/ACT Aepb Commonly known as: ADVAIR Inhale 1 puff into the lungs daily.   heparin lock flush 100 UNIT/ML Soln injection Inject 10 Units into the vein 3 (three) times daily. Use SASH method with IV ABT   hydrALAZINE 50 MG tablet Commonly known as: APRESOLINE Take 1 tablet (50 mg total) by mouth 3 (three) times daily.   insulin aspart 100 UNIT/ML injection Commonly known as: novoLOG Inject 3 Units into the skin 3 (three) times daily with meals. Hold for NPO or consuming less than 50% of meals What changed:  when to take this additional instructions   insulin aspart 100 UNIT/ML FlexPen Commonly known as: NOVOLOG Before each meal 3 times a day, 140-199 - 2 units, 200-250 - 4 units, 251-299 - 8 units,  300-349 - 10 units,  350 or above 12 units. I What changed: You were already taking a medication with the same name, and this prescription was added. Make sure you understand how and when to take each.   insulin glargine 100 UNIT/ML injection Commonly known as: LANTUS Inject 0.1 mLs (  10 Units total) into the skin 2 (two) times daily. What changed: how much to take   metroNIDAZOLE 500 MG tablet Commonly known  as: FLAGYL Take 1 tablet (500 mg total) by mouth every 12 (twelve) hours.   mirtazapine 7.5 MG tablet Commonly known as: REMERON Take 1 tablet (7.5 mg total) by mouth at bedtime.   multivitamin with minerals tablet Take 1 tablet by mouth daily.   pantoprazole 40 MG tablet Commonly known as: PROTONIX Take 1 tablet (40 mg total) by mouth daily.   polyethylene glycol 17 g packet Commonly known as: MIRALAX / GLYCOLAX Take 17 g by mouth daily. Titrate as needed for constipation. What changed: additional instructions   senna-docusate 8.6-50 MG tablet Commonly known as: Senokot-S Take 1 tablet by mouth daily.   sodium chloride flush 0.9 % Soln injection Inject 10 mLs into the vein 3 (three) times daily. Use SASH method with IV ABT   traMADol 50 MG tablet Commonly known as: ULTRAM Take 1 tablet (50 mg total) by mouth every 8 (eight) hours as needed for moderate pain or severe pain.   traZODone 50 MG tablet Commonly known as: DESYREL Take 0.5 tablets (25 mg total) by mouth at bedtime as needed for sleep.               Discharge Care Instructions  (From admission, onward)           Start     Ordered   09/29/22 0000  Discharge wound care:       Comments: Continue taking care of right groin wound VAC as before, keep the PICC line site clean and dry at all times   09/29/22 0750             Contact information for follow-up providers     [name], [place]. Schedule an appointment as soon as possible for a visit in 1 week(s).   Specialty: Family Medicine Why: And with your surgeon in a week Contact information: [place] [address] [phone]              Contact information for after-discharge care     Destination     [facility] Preferred SNF .   Service: Skilled Nursing Contact information: [address] [place] [phone]                     Major procedures and  Radiology Reports - PLEASE review detailed and final reports thoroughly  -         EEG adult  Result Date: 09/28/2022 Lora Havens, MD     09/28/2022  8:37 AM Patient Name: Heather [name] MRN: [id number] Epilepsy Attending: Lora Havens Referring Physician/Provider: Lorenza Chick, MD Date: 09/28/2022 Duration: 29.41 mins Patient history: 72yo M with ams. EEG to evaluate for seizure. Level of alertness: Awake, asleep AEDs during EEG study: None Technical aspects: This EEG study was done with scalp electrodes positioned according to the 10-20 International system of electrode placement. Electrical activity was reviewed with band pass filter of 1-'70Hz'$ , sensitivity of 7 uV/mm, display speed of 80m/sec with a '60Hz'$  notched filter applied as appropriate. EEG data were recorded continuously and digitally stored.  Video monitoring was available and reviewed as appropriate. Description: The posterior dominant rhythm consists of 7.5 Hz activity of moderate voltage (25-35 uV) seen predominantly in posterior head regions, symmetric and reactive to eye opening and eye closing. Sleep was characterized by vertex  waves, sleep spindles (12 to 14 Hz), maximal frontocentral region. EEG showed intermittent generalized polymorphic sharply contoured 3 to 6 Hz theta-delta slowing, at times with triphasic morphology . Hyperventilation and photic stimulation were not performed.   ABNORMALITY - Intermittent slow, generalized IMPRESSION: This study is suggestive of mild to moderate diffuse encephalopathy, nonspecific etiology but could be related to toxic-metabolic causes. No seizures or definite epileptiform discharges were seen throughout the recording. Lora Havens   MR BRAIN WO CONTRAST  Result Date: 09/27/2022 CLINICAL DATA:  Altered mental status EXAM: MRI HEAD WITHOUT CONTRAST TECHNIQUE: Multiplanar, multiecho pulse sequences of the brain and surrounding structures were obtained without intravenous contrast.  COMPARISON:  08/04/2022 FINDINGS: Brain: No acute infarct, mass effect or extra-axial collection. Old blood products at the midline posterior fossa. There is multifocal hyperintense T2-weighted signal within the white matter. Generalized volume loss. The midline structures are normal. 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 Vascular: Normal flow voids. Skull and upper cervical spine: Normal marrow signal. Sinuses/Orbits: Bilateral mastoid fluid. Paranasal sinuses are clear. Bilateral ocular lens replacements. Other: None IMPRESSION: 1. No acute intracranial abnormality. 2. 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 Electronically Signed   By: Ulyses Jarred M.D.   On: 09/27/2022 23:32   DG Chest 2 View  Result Date: 09/27/2022 CLINICAL DATA:  Altered level of consciousness EXAM: CHEST - 2 VIEW COMPARISON:  09/18/2022 FINDINGS: Frontal and lateral views of the chest demonstrates stable aortic valve prosthesis. Left-sided PICC, tip in the region of the superior vena cava. The cardiac silhouette is unremarkable. No airspace disease, effusion, or pneumothorax. No acute bony abnormalities. IMPRESSION: 1. No acute intrathoracic process. Electronically Signed   By: Randa Ngo M.D.   On: 09/27/2022 17:46   CT Head Wo Contrast  Result Date: 09/27/2022 CLINICAL DATA:  Mental status change. EXAM: CT HEAD WITHOUT CONTRAST TECHNIQUE: Contiguous axial images were obtained from the base of the skull through the vertex without intravenous contrast. RADIATION DOSE REDUCTION: This exam was performed according to the departmental dose-optimization program which includes automated exposure control, adjustment of the mA and/or kV according to patient size and/or use of iterative reconstruction technique. COMPARISON:  August 16, 2022 FINDINGS: Brain: No evidence of acute hemorrhage, hydrocephalus, extra-axial collection or mass lesion/mass effect. Moderate brain  parenchymal volume loss and deep white matter microangiopathy. Small areas of hypoattenuation in the right cerebellum with uncertain significance. Vascular: No hyperdense vessel or unexpected calcification. Skull: Normal. Negative for fracture or focal lesion. Sinuses/Orbits: Mucosal thickening of the right maxillary sinus. Other: None. IMPRESSION: No evidence of acute hemorrhage. Small areas of hypoattenuation in the right cerebellum with uncertain significance. These may represent age-indeterminate infarcts. Moderate brain parenchymal volume loss and deep white matter microangiopathy. Electronically Signed   By: Fidela Salisbury M.D.   On: 09/27/2022 17:44   [place] EKG SITE RITE  Result Date: 09/19/2022 If Site Rite image not attached, placement could not be confirmed due to current cardiac rhythm.  DG CHEST PORT 1 VIEW  Result Date: 09/18/2022 CLINICAL DATA:  COVID EXAM: PORTABLE CHEST 1 VIEW COMPARISON:  None Available. FINDINGS: The heart size and mediastinal contours are within normal limits. Patient is status post TAVR, unchanged. Both lungs are clear. The visualized skeletal structures are unremarkable. IMPRESSION: No active disease. Electronically Signed   By: Ronney Asters M.D.   On: 09/18/2022 15:34    Micro Results     Recent Results (from the past 240 hour(s))  MRSA Next Gen by PCR, Nasal     Status: None   Collection Time: 09/28/22  5:18 AM   Specimen: Wound; Nasal Swab  Result Value Ref Range Status   MRSA by PCR Next Gen NOT DETECTED NOT DETECTED Final    Comment: (NOTE) The GeneXpert MRSA Assay (FDA approved for NASAL specimens only), is one component of a comprehensive MRSA colonization surveillance program. It is not intended to diagnose MRSA infection nor to guide or monitor treatment for MRSA infections. Test performance is not FDA approved in patients less than 6 years old. Performed at [facility], [place] [address] [place], [place] [address]     Today    Subjective    Heather [name] today has no headache,no chest abdominal pain,no new weakness tingling or numbness, feels much better wants to go home today.     Objective   Blood pressure 111/64, pulse 73, temperature 98.5 F (36.9 C), temperature source Oral, resp. rate 13, height 5' (1.524 m), weight 55.6 kg, SpO2 97 %.   Intake/Output Summary (Last 24 hours) at 09/30/2022 1331 Last data filed at 09/30/2022 0900 Gross per 24 hour  Intake 780 ml  Output --  Net 780 ml    Exam  Awake Alert 2, No new F.N deficits, Normal affect, mild essential tremors, mild right groin wound VAC in place, left arm PICC line,  [place].AT,PERRAL Supple Neck,   Symmetrical Chest wall movement, Good air movement bilaterally, CTAB RRR,No Gallops,   +ve B.Sounds, Abd Soft, Non tender,  No Cyanosis, Clubbing or edema    Data Review   Recent Labs  Lab 09/27/22 1647 09/28/22 0208  WBC 6.4 7.1  HGB 10.7* 10.7*  HCT 32.5* 33.4*  PLT 503* 483*  MCV 95.3 94.6  MCH 31.4 30.3  MCHC 32.9 32.0  RDW 16.9* 16.9*  LYMPHSABS 1.1  --   MONOABS 0.8  --   EOSABS 0.4  --   BASOSABS 0.1  --     Recent Labs  Lab 09/27/22 1647 09/27/22 1648 09/28/22 0208  NA 135  --  135  K 3.9  --  3.5  CL 103  --  103  CO2 23  --  23  ANIONGAP 9  --  9  GLUCOSE 246*  --  178*  BUN 9  --  9  CREATININE 0.66  --  0.65  AST 19  --  16  ALT 8  --  8  ALKPHOS 67  --  61  BILITOT 0.4  --  0.4  ALBUMIN 2.4*  --  2.5*  LATICACIDVEN  --  0.9  --   TSH  --   --  1.258  AMMONIA  --  15  --   CALCIUM 9.1  --  9.2     Total Time in preparing paper work, data evaluation and todays exam - 35 minutes  Signature  -    Lala Lund M.D on 09/30/2022 at 1:31 PM   -  To page go to [email or web address]

## 2022-09-30 ENCOUNTER — Ambulatory Visit: Payer: Medicare HMO | Admitting: Infectious Diseases

## 2022-09-30 DIAGNOSIS — G9340 Encephalopathy, unspecified: Secondary | ICD-10-CM | POA: Diagnosis not present

## 2022-09-30 LAB — GLUCOSE, CAPILLARY
Glucose-Capillary: 176 mg/dL — ABNORMAL HIGH (ref 70–99)
Glucose-Capillary: 106 mg/dL — ABNORMAL HIGH (ref 70–99)

## 2022-09-30 MED ORDER — TRAMADOL HCL 50 MG PO TABS: 50.0000 mg | ORAL_TABLET | Freq: Three times a day (TID) | ORAL | 0 refills | Status: DC | PRN

## 2022-09-30 MED ORDER — SENNOSIDES-DOCUSATE SODIUM 8.6-50 MG PO TABS: 1.0000 | ORAL_TABLET | Freq: Every day | ORAL | Status: DC

## 2022-09-30 MED ORDER — DOCUSATE SODIUM 100 MG PO CAPS: 100.0000 mg | ORAL_CAPSULE | Freq: Every day | ORAL | 0 refills | Status: DC

## 2022-09-30 NOTE — Consult Note (Signed)
[place] Nurse Consult Note: Reason for Consult: orders for NPWT device to be removed per bedside nursing.  Was not completed on day or evening shift yesterday 2 /4/24.  Wound type:surgical  Pressure Injury POA: NA Measurement: Right groin: 3cm x 4cm x 1.0cm  Left groin: 5cm x 4cm x 1.5cm  Wound bed:100% granulation, minimal fibrin  Drainage (amount, consistency, odor) unable to assess in SNF canister due to solidifier  Periwound: intact  Removed SNF device and dressings. Removed canister from SNF device and placed device and device charger in patient belongings bag with patients one item of clothing. Labeled bag as well.  Saline moist 2x2 gauze x 2 in each groin wound, topped with dry dressings. Secured with tape. To be change BID until patient returns to SNF, planned for today.  Discussed with bedside nurse and patient, orders updated.   Follow up with [facility] as scheduled SNF to place NPWT dressing and therapy when she arrives back, current device is not compatible with hospital unit.   Discussed POC with patient and bedside nurse.  Re consult if needed, will not follow at this time. Thanks  Elder Davidian [employer], RN,CWOCN, CNS, [place] [phone])

## 2022-09-30 NOTE — TOC Progression Note (Signed)
Transition of Care [facility]) - Progression Note    Patient Details  Name: Heather [name] MRN: [id number] Date of Birth: 1951-05-06  Transition of Care [facility]) CM/SW [place], LCSW Phone Number: 09/30/2022, 9:05 AM  Clinical Narrative:    Per CMA, insurance has been approved 2/5 - 2/7 NRD 2/8 Milus Glazier #[id number]. Updated [employer].        Expected Discharge Plan and Services         Expected Discharge Date: 09/29/22                                     Social Determinants of Health (SDOH) Interventions SDOH Screenings   Food Insecurity: No Food Insecurity (09/28/2022)  Recent Concern: [place] Present (08/11/2022)  Housing: Low Risk  (09/28/2022)  Transportation Needs: No Transportation Needs (09/28/2022)  Utilities: Not At Risk (09/28/2022)  Financial Resource Strain: Medium Risk (10/19/2018)  Physical Activity: Inactive (10/19/2018)  Social Connections: Unknown (10/19/2018)  Tobacco Use: High Risk (09/28/2022)    Readmission Risk Interventions    11/13/2020    2:27 PM  Readmission Risk Prevention Plan  Transportation Screening Complete  PCP or Specialist Appt within 3-5 Days Complete  HRI or [place] Complete  Social Work Consult for [place] Planning/Counseling Complete  Palliative Care Screening Not Applicable  Medication Review Press photographer) Complete

## 2022-09-30 NOTE — TOC Transition Note (Signed)
Transition of Care [facility]) - CM/SW Discharge Note   Patient Details  Name: Heather [name] MRN: [id number] Date of Birth: 1950/10/12  Transition of Care [facility]) CM/SW Contact:  Benard Halsted, LCSW Phone Number: 09/30/2022, 1:55 PM   Clinical Narrative:    Patient will DC to: [facility] Anticipated DC date: 09/30/22 Family notified: Daughter Transport by: Corey Harold   Per MD patient ready for DC to [facility]. RN to call report prior to discharge ([phone]). RN, patient, patient's family, and facility notified of DC. Discharge Summary and FL2 sent to facility. DC packet on chart including signed script. Ambulance transport requested for patient.   CSW will sign off for now as social work intervention is no longer needed. Please consult [place] again if new needs arise.     Final next level of care: Skilled Nursing Facility Barriers to Discharge: Barriers Resolved   Patient Goals and CMS Choice CMS Medicare.gov Compare Post Acute Care list provided to:: Patient Choice offered to / list presented to : Patient  Discharge Placement     Existing PASRR number confirmed : 09/30/22          Patient [facility] bed at:  [facility]) Patient to be transferred to facility by: [place] Name of family member notified: Daughter Patient and family notified of of transfer: 09/30/22  Discharge Plan and Services Additional resources added to the After Visit Summary for                                       Social Determinants of Health (SDOH) Interventions SDOH Screenings   Food Insecurity: No Food Insecurity (09/28/2022)  Recent Concern: [place] Present (08/11/2022)  Housing: Low Risk  (09/28/2022)  Transportation Needs: No Transportation Needs (09/28/2022)  Utilities: Not At Risk (09/28/2022)  Financial Resource Strain: Medium Risk (10/19/2018)  Physical Activity: Inactive (10/19/2018)  Social Connections: Unknown (10/19/2018)  Tobacco Use: High Risk (09/28/2022)      Readmission Risk Interventions    11/13/2020    2:27 PM  Readmission Risk Prevention Plan  Transportation Screening Complete  PCP or Specialist Appt within 3-5 Days Complete  HRI or [place] Complete  Social Work Consult for [place] Planning/Counseling Complete  Palliative Care Screening Not Applicable  Medication Review Press photographer) Complete

## 2022-09-30 NOTE — Progress Notes (Signed)
Patient discharged with transport to facility. Wound vac clothes and phone with charger sent with transport for the patient.

## 2022-09-30 NOTE — Progress Notes (Signed)
[facility]                                                                                                                                                                         Patient Demographics  Heather [name], is a 72 y.o. female  KDT:[id number]  DXI:[id number]  DOB - 08-01-1951  Admit date - 09/27/2022  Admitting Physician [name], DO  Outpatient Primary MD for the patient is [name], [place]  LOS - 2   Chief Complaint  Patient presents with   Altered Mental Status        Assessment & Plan    Patient seen briefly today due for discharge soon per Discharge done yesterday by me, no further issues, Vital signs stable, patient feels fine, await insurance authorization to go back to the same facility she came from 2 days ago.    Medications  Scheduled Meds:  amLODipine  10 mg Oral Daily   atorvastatin  80 mg Oral QHS   carvedilol  6.25 mg Oral BID WC   Chlorhexidine Gluconate Cloth  6 each Topical Daily   clopidogrel  75 mg Oral Daily   dronabinol  2.5 mg Oral BID AC   escitalopram  10 mg Oral Daily   hydrALAZINE  50 mg Oral Q8H   insulin aspart  0-15 Units Subcutaneous TID WC   insulin aspart  0-5 Units Subcutaneous QHS   insulin aspart  3 Units Subcutaneous TID WC   insulin glargine-yfgn  10 Units Subcutaneous BID   metroNIDAZOLE  500 mg Oral Q12H   mirtazapine  7.5 mg Oral QHS   mometasone-formoterol  2 puff Inhalation BID   multivitamin with minerals  1 tablet Oral Daily   pantoprazole  40 mg Oral Daily   polyethylene glycol  17 g Oral Daily   Continuous Infusions:  ceFEPime (MAXIPIME) IV 2 g (09/30/22 0153)   PRN Meds:.acetaminophen **OR** acetaminophen, albuterol, fentaNYL (SUBLIMAZE) injection, fluticasone, hydrALAZINE, ondansetron **OR** ondansetron (ZOFRAN) IV, sodium chloride flush, traMADol, traZODone    Time Spent in minutes   10 minutes   Lala Lund  M.D on 09/30/2022 at 8:23 AM  Between 7am to 7pm - Pager - [phone]  After 7pm go to [email or web address] - password [email or web address]  And look for the night coverage person covering for me after hours  [facility] Office  [phone]    Subjective:   Heather [name] today has, No headache, No chest pain, No abdominal pain - No Nausea, No new weakness tingling or numbness, No Cough - SOB.   Objective:   Vitals:   09/29/22 2202 09/30/22 0009 09/30/22 0408 09/30/22 0700  BP: 117/64 (!) 114/57 115/64   Pulse:  86 84 85  Resp:  $'14 13 15  'N$ Temp:  98.6 F (37 C) 98.9 F (37.2 C)   TempSrc:  Oral Oral   SpO2:  94% 92% 94%  Weight:      Height:        Wt Readings from Last 3 Encounters:  09/28/22 55.6 kg  09/21/22 55.4 kg  08/03/22 58.7 kg     Intake/Output Summary (Last 24 hours) at 09/30/2022 0823 Last data filed at 09/29/2022 1800 Gross per 24 hour  Intake 980 ml  Output --  Net 980 ml    Exam  Awake Alert, No new F.N deficits, Normal affect [place].AT,PERRAL Supple Neck, No JVD,   Symmetrical Chest wall movement, Good air movement bilaterally, CTAB RRR,No Gallops, Rubs or new Murmurs,  +ve B.Sounds, Abd Soft, No tenderness,   No Cyanosis, Clubbing or edema   Data Review

## 2022-09-30 NOTE — Progress Notes (Signed)
Report Called to April RN for patient discharging back to SNF she was previous prior to admission. Patient wound vac is to return with her it is in patient's belongings bag. Wound vac off patient at this time per wound care RN. Patient has wet to dry dressing in place bilateral groins.

## 2022-09-30 NOTE — NC FL2 (Signed)
[place] LEVEL OF CARE FORM     IDENTIFICATION  Patient Name: Heather [name] Birthdate: 05/17/1951 Sex: female Admission Date (Current Location): 09/27/2022  [facility] and [place] Number:  Herbalist and Address:  [facility], [place] [address] [place], [place] [address]      Provider Number: [id number]  Attending Physician Name and Address:  Thurnell Lose, MD  Relative Name and Phone Number:       Current Level of Care: Hospital Recommended Level of Care: [place] Prior Approval Number:    Date Approved/Denied:   PASRR Number: [id number]  Discharge Plan: SNF    Current Diagnoses: Patient Active Problem List   Diagnosis Date Noted   Acute encephalopathy 09/28/2022   Surgical site infection 09/15/2022   Decreased oral intake 09/13/2022   Chronic diastolic CHF (congestive heart failure) ([place]) 09/13/2022   Dysphagia 09/13/2022   PVD (peripheral vascular disease) ([place]) 09/11/2022   Right groin wound 09/10/2022   Wound of left groin 09/10/2022   Pulmonary edema 08/16/2022   Abscess of anal and rectal regions 08/15/2022   Decubitus ulcer of sacral region, stage 2 ([place]) 08/15/2022   ICH (intracerebral hemorrhage) ([place]) 08/04/2022   Atherosclerosis of native arteries of extremity with rest pain ([place]) 04/02/2022   PAD (peripheral artery disease) ([place]) 04/11/2021   Ischemic leg pain 04/11/2021   Chronic mesenteric ischemia ([place]) 12/17/2020   Malnutrition of moderate degree 11/12/2020   Asymptomatic bacteriuria 11/10/2020   Abdominal pain 11/09/2020   Hypokalemia 11/09/2020   AKI (acute kidney injury) ([place]) 11/09/2020   MDD (major depressive disorder), recurrent, in full remission ([place]) 01/16/2020   Anxiety disorder 01/16/2020   Hyperlipidemia 12/15/2019   Carotid stenosis 12/15/2019   MDD (major depressive disorder), recurrent, in partial remission ([place]) 08/24/2019   MDD (major depressive disorder),  recurrent episode, mild ([place]) 04/08/2019   Insomnia due to mental condition 04/08/2019   Major neurocognitive disorder due to Alzheimer's disease, probable, without behavioral disturbance 04/08/2019   DDD (degenerative disc disease), cervical 12/19/2016   Frequent falls 12/10/2016   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   Nonrheumatic aortic valve stenosis 09/30/2016   (HFpEF) heart failure with preserved ejection fraction ([place]) 09/20/2016   COPD (chronic obstructive pulmonary disease) ([place]) 09/20/2016   Respiratory failure with hypoxia ([place]) 09/20/2016   NSTEMI (non-ST elevated myocardial infarction) ([place]) 08/21/2016   SOB (shortness of breath) 08/18/2016   Atherosclerosis of native arteries of extremity with intermittent claudication ([place]) 01/08/2016   Palpitations 07/10/2015   History of nonmelanoma skin cancer 03/16/2015   Pseudoaneurysm following procedure ([place]) 07/19/2014   Femoral artery occlusion, right ([place]) 06/28/2014   Claudication ([place]) 06/05/2014   Aortic insufficiency 03/20/2014   CAD (coronary artery disease) 03/20/2014   Chronic pain 03/20/2014   Memory loss 11/09/2012   Depressive disorder 08/02/2012   Neck pain 08/02/2012   Malaise and fatigue 08/02/2012   Hypertension, benign 08/02/2012   Dizziness 08/02/2012   Polyneuropathy in diabetes ([place]) 08/02/2012   Rheumatic fever with heart involvement 08/02/2012   Tobacco use disorder 08/02/2012   Diabetes mellitus ([place]) 08/02/1990    Orientation RESPIRATION BLADDER Height & Weight     Self, Time, Situation, Place  Normal Incontinent, External catheter Weight: 122 lb 9.2 oz (55.6 kg) Height:  5' (152.4 cm)  BEHAVIORAL SYMPTOMS/MOOD NEUROLOGICAL BOWEL NUTRITION STATUS      Incontinent Diet (See dc summary)  AMBULATORY STATUS COMMUNICATION OF NEEDS Skin   Extensive Assist  Verbally PU Stage and Appropriate Care, Other (Comment), Wound Vac (Wound vac on groin; pressure injury stage 2 on sacrum,  non-pressure injury on buttocks)                       Personal Care Assistance Level of Assistance  Bathing, Feeding, Dressing Bathing Assistance: Limited assistance Feeding assistance: Independent Dressing Assistance: Limited assistance     Functional Limitations Info  Sight, Hearing Sight Info: Impaired Hearing Info: Impaired Speech Info: Adequate    SPECIAL CARE FACTORS FREQUENCY  PT (By licensed PT), OT (By licensed OT)     PT Frequency: 5x/week OT Frequency: 5x/week            Contractures Contractures Info: Not present    Additional Factors Info  Code Status, Allergies Code Status Info: Full Allergies Info: Iodinated Contrast Media, Sulfa Antibiotics, Shellfish Allergy           Current Medications (09/30/2022):  This is the current hospital active medication list Current Facility-Administered Medications  Medication Dose Route Frequency Provider Last Rate Last Admin   acetaminophen (TYLENOL) tablet 650 mg  650 mg Oral Q6H PRN [name], DO   650 mg at 09/29/22 1244   Or   acetaminophen (TYLENOL) suppository 650 mg  650 mg Rectal Q6H PRN [name], DO       albuterol (PROVENTIL) (2.5 MG/3ML) 0.083% nebulizer solution 3 mL  3 mL Inhalation Q4H PRN [name], DO       amLODipine (NORVASC) tablet 10 mg  10 mg Oral Daily Thurnell Lose, MD   10 mg at 09/30/22 3532   atorvastatin (LIPITOR) tablet 80 mg  80 mg Oral QHS Jennette Kettle M, DO   80 mg at 09/29/22 2201   carvedilol (COREG) tablet 6.25 mg  6.25 mg Oral BID WC [name], DO   6.25 mg at 09/30/22 9924   ceFEPIme (MAXIPIME) 2 g in sodium chloride 0.9 % 100 mL IVPB  2 g Intravenous Q12H Cherlynn June B, PA-C 200 mL/hr at 09/30/22 0153 2 g at 09/30/22 0153   Chlorhexidine Gluconate Cloth 2 % PADS 6 each  6 each Topical Daily Thurnell Lose, MD   6 each at 09/30/22 2683   clopidogrel (PLAVIX) tablet 75 mg  75 mg Oral Daily [name], DO   75 mg at 09/30/22 4196    dronabinol (MARINOL) capsule 2.5 mg  2.5 mg Oral BID AC Jennette Kettle M, DO   2.5 mg at 09/29/22 1700   escitalopram (LEXAPRO) tablet 10 mg  10 mg Oral Daily Jennette Kettle M, DO   10 mg at 09/30/22 2229   fentaNYL (SUBLIMAZE) injection 25 mcg  25 mcg Intravenous Q2H PRN [name], DO   25 mcg at 09/28/22 1659   fluticasone (FLONASE) 50 MCG/ACT nasal spray 1 spray  1 spray Each Nare Daily PRN [name], DO       hydrALAZINE (APRESOLINE) injection 10 mg  10 mg Intravenous Q6H PRN Thurnell Lose, MD       hydrALAZINE (APRESOLINE) tablet 50 mg  50 mg Oral Q8H Lala Lund K, MD   50 mg at 09/29/22 1431   insulin aspart (novoLOG) injection 0-15 Units  0-15 Units Subcutaneous TID WC Thurnell Lose, MD   3 Units at 09/30/22 0926   insulin aspart (novoLOG) injection 0-5 Units  0-5 Units Subcutaneous QHS Thurnell Lose, MD  insulin aspart (novoLOG) injection 3 Units  3 Units Subcutaneous TID WC [name], DO   3 Units at 09/30/22 0926   insulin glargine-yfgn [facility]) injection 10 Units  10 Units Subcutaneous BID Thurnell Lose, MD   10 Units at 09/30/22 3845   metroNIDAZOLE (FLAGYL) tablet 500 mg  500 mg Oral Q12H Dorothyann Peng, PA-C   500 mg at 09/30/22 3646   mirtazapine (REMERON) tablet 7.5 mg  7.5 mg Oral QHS [name], DO   7.5 mg at 09/29/22 2202   mometasone-formoterol (DULERA) 200-5 MCG/ACT inhaler 2 puff  2 puff Inhalation BID [name], DO   2 puff at 09/30/22 8032   multivitamin with minerals tablet 1 tablet  1 tablet Oral Daily [name], DO   1 tablet at 09/30/22 0923   ondansetron (ZOFRAN) tablet 4 mg  4 mg Oral Q6H PRN [name], DO       Or   ondansetron [facility]) injection 4 mg  4 mg Intravenous Q6H PRN [name], DO       pantoprazole (PROTONIX) EC tablet 40 mg  40 mg Oral Daily Jennette Kettle M, DO   40 mg at 09/30/22 1224   polyethylene glycol (MIRALAX / GLYCOLAX) packet 17 g  17 g Oral Daily Jennette Kettle  M, DO   17 g at 09/28/22 8250   sodium chloride flush (NS) 0.9 % injection 10-40 mL  10-40 mL Intracatheter PRN Thurnell Lose, MD       traMADol [facility] [place]) tablet 50 mg  50 mg Oral Q6H PRN Thurnell Lose, MD   50 mg at 09/29/22 2235   traZODone (DESYREL) tablet 25 mg  25 mg Oral QHS PRN [name], DO   25 mg at 09/29/22 2235     Discharge Medications: Please see discharge summary for a list of discharge medications.  Relevant Imaging Results:  Relevant Lab Results:   Additional Information SSN [place] [address] [place], [place]

## 2022-10-01 ENCOUNTER — Inpatient Hospital Stay (HOSPITAL_COMMUNITY)
Admission: EM | Admit: 2022-10-01 | Discharge: 2022-10-06 | DRG: 092 | Disposition: A | Discharge status: Skilled Nursing Facility | Payer: Medicare HMO | Source: Skilled Nursing Facility | Attending: Internal Medicine | Admitting: Internal Medicine

## 2022-10-01 ENCOUNTER — Emergency Department (HOSPITAL_COMMUNITY): Payer: Medicare HMO

## 2022-10-01 ENCOUNTER — Other Ambulatory Visit: Payer: Self-pay

## 2022-10-01 DIAGNOSIS — G40901 Epilepsy, unspecified, not intractable, with status epilepticus: Secondary | ICD-10-CM | POA: Diagnosis present

## 2022-10-01 DIAGNOSIS — S31109D Unspecified open wound of abdominal wall, unspecified quadrant without penetration into peritoneal cavity, subsequent encounter: Secondary | ICD-10-CM | POA: Diagnosis not present

## 2022-10-01 DIAGNOSIS — I110 Hypertensive heart disease with heart failure: Secondary | ICD-10-CM | POA: Diagnosis present

## 2022-10-01 DIAGNOSIS — R4182 Altered mental status, unspecified: Secondary | ICD-10-CM

## 2022-10-01 DIAGNOSIS — R6251 Failure to thrive (child): Secondary | ICD-10-CM

## 2022-10-01 DIAGNOSIS — Z794 Long term (current) use of insulin: Secondary | ICD-10-CM

## 2022-10-01 DIAGNOSIS — I700 Atherosclerosis of aorta: Secondary | ICD-10-CM | POA: Diagnosis present

## 2022-10-01 DIAGNOSIS — Z89612 Acquired absence of left leg above knee: Secondary | ICD-10-CM

## 2022-10-01 DIAGNOSIS — L89152 Pressure ulcer of sacral region, stage 2: Secondary | ICD-10-CM | POA: Diagnosis present

## 2022-10-01 DIAGNOSIS — K219 Gastro-esophageal reflux disease without esophagitis: Secondary | ICD-10-CM | POA: Diagnosis present

## 2022-10-01 DIAGNOSIS — Z7902 Long term (current) use of antithrombotics/antiplatelets: Secondary | ICD-10-CM

## 2022-10-01 DIAGNOSIS — D649 Anemia, unspecified: Secondary | ICD-10-CM | POA: Diagnosis present

## 2022-10-01 DIAGNOSIS — Z91013 Allergy to seafood: Secondary | ICD-10-CM

## 2022-10-01 DIAGNOSIS — I10 Essential (primary) hypertension: Secondary | ICD-10-CM | POA: Diagnosis present

## 2022-10-01 DIAGNOSIS — Z955 Presence of coronary angioplasty implant and graft: Secondary | ICD-10-CM

## 2022-10-01 DIAGNOSIS — G9341 Metabolic encephalopathy: Secondary | ICD-10-CM | POA: Diagnosis present

## 2022-10-01 DIAGNOSIS — R627 Adult failure to thrive: Secondary | ICD-10-CM | POA: Insufficient documentation

## 2022-10-01 DIAGNOSIS — L89322 Pressure ulcer of left buttock, stage 2: Secondary | ICD-10-CM | POA: Diagnosis present

## 2022-10-01 DIAGNOSIS — J449 Chronic obstructive pulmonary disease, unspecified: Secondary | ICD-10-CM | POA: Diagnosis present

## 2022-10-01 DIAGNOSIS — L89892 Pressure ulcer of other site, stage 2: Secondary | ICD-10-CM | POA: Diagnosis present

## 2022-10-01 DIAGNOSIS — Z8673 Personal history of transient ischemic attack (TIA), and cerebral infarction without residual deficits: Secondary | ICD-10-CM

## 2022-10-01 DIAGNOSIS — E119 Type 2 diabetes mellitus without complications: Secondary | ICD-10-CM

## 2022-10-01 DIAGNOSIS — T361X5A Adverse effect of cephalosporins and other beta-lactam antibiotics, initial encounter: Secondary | ICD-10-CM | POA: Diagnosis present

## 2022-10-01 DIAGNOSIS — G928 Other toxic encephalopathy: Principal | ICD-10-CM | POA: Diagnosis present

## 2022-10-01 DIAGNOSIS — E1151 Type 2 diabetes mellitus with diabetic peripheral angiopathy without gangrene: Secondary | ICD-10-CM | POA: Diagnosis present

## 2022-10-01 DIAGNOSIS — M5030 Other cervical disc degeneration, unspecified cervical region: Secondary | ICD-10-CM | POA: Diagnosis present

## 2022-10-01 DIAGNOSIS — Z818 Family history of other mental and behavioral disorders: Secondary | ICD-10-CM

## 2022-10-01 DIAGNOSIS — F319 Bipolar disorder, unspecified: Secondary | ICD-10-CM | POA: Diagnosis present

## 2022-10-01 DIAGNOSIS — I2510 Atherosclerotic heart disease of native coronary artery without angina pectoris: Secondary | ICD-10-CM | POA: Diagnosis present

## 2022-10-01 DIAGNOSIS — E1142 Type 2 diabetes mellitus with diabetic polyneuropathy: Secondary | ICD-10-CM | POA: Diagnosis present

## 2022-10-01 DIAGNOSIS — Z91148 Patient's other noncompliance with medication regimen for other reason: Secondary | ICD-10-CM

## 2022-10-01 DIAGNOSIS — Y838 Other surgical procedures as the cause of abnormal reaction of the patient, or of later complication, without mention of misadventure at the time of the procedure: Secondary | ICD-10-CM | POA: Diagnosis present

## 2022-10-01 DIAGNOSIS — Z953 Presence of xenogenic heart valve: Secondary | ICD-10-CM

## 2022-10-01 DIAGNOSIS — Z79899 Other long term (current) drug therapy: Secondary | ICD-10-CM

## 2022-10-01 DIAGNOSIS — Z91041 Radiographic dye allergy status: Secondary | ICD-10-CM

## 2022-10-01 DIAGNOSIS — Z8616 Personal history of COVID-19: Secondary | ICD-10-CM

## 2022-10-01 DIAGNOSIS — I5032 Chronic diastolic (congestive) heart failure: Secondary | ICD-10-CM | POA: Diagnosis present

## 2022-10-01 DIAGNOSIS — Z882 Allergy status to sulfonamides status: Secondary | ICD-10-CM

## 2022-10-01 DIAGNOSIS — F17210 Nicotine dependence, cigarettes, uncomplicated: Secondary | ICD-10-CM | POA: Diagnosis present

## 2022-10-01 DIAGNOSIS — Z85828 Personal history of other malignant neoplasm of skin: Secondary | ICD-10-CM

## 2022-10-01 DIAGNOSIS — E785 Hyperlipidemia, unspecified: Secondary | ICD-10-CM | POA: Diagnosis present

## 2022-10-01 DIAGNOSIS — T8141XA Infection following a procedure, superficial incisional surgical site, initial encounter: Secondary | ICD-10-CM | POA: Diagnosis present

## 2022-10-01 DIAGNOSIS — E1165 Type 2 diabetes mellitus with hyperglycemia: Secondary | ICD-10-CM | POA: Diagnosis present

## 2022-10-01 DIAGNOSIS — I252 Old myocardial infarction: Secondary | ICD-10-CM

## 2022-10-01 DIAGNOSIS — Z9862 Peripheral vascular angioplasty status: Secondary | ICD-10-CM

## 2022-10-01 DIAGNOSIS — S31109A Unspecified open wound of abdominal wall, unspecified quadrant without penetration into peritoneal cavity, initial encounter: Secondary | ICD-10-CM | POA: Diagnosis present

## 2022-10-01 LAB — CBC WITH DIFFERENTIAL/PLATELET
Abs Immature Granulocytes: 0.03 10*3/uL (ref 0.00–0.07)
Basophils Absolute: 0.1 10*3/uL (ref 0.0–0.1)
Basophils Relative: 1 %
Eosinophils Absolute: 0.3 10*3/uL (ref 0.0–0.5)
Eosinophils Relative: 5 %
HCT: 33.5 % — ABNORMAL LOW (ref 36.0–46.0)
Hemoglobin: 11.2 g/dL — ABNORMAL LOW (ref 12.0–15.0)
Immature Granulocytes: 1 %
Lymphocytes Relative: 17 %
Lymphs Abs: 1.1 10*3/uL (ref 0.7–4.0)
MCH: 30.9 pg (ref 26.0–34.0)
MCHC: 33.4 g/dL (ref 30.0–36.0)
MCV: 92.5 fL (ref 80.0–100.0)
Monocytes Absolute: 0.7 10*3/uL (ref 0.1–1.0)
Monocytes Relative: 11 %
Neutro Abs: 4.4 10*3/uL (ref 1.7–7.7)
Neutrophils Relative %: 65 %
Platelets: 434 10*3/uL — ABNORMAL HIGH (ref 150–400)
RBC: 3.62 MIL/uL — ABNORMAL LOW (ref 3.87–5.11)
RDW: 16.7 % — ABNORMAL HIGH (ref 11.5–15.5)
WBC: 6.6 10*3/uL (ref 4.0–10.5)
nRBC: 0 % (ref 0.0–0.2)

## 2022-10-01 LAB — COMPREHENSIVE METABOLIC PANEL
ALT: 9 U/L (ref 0–44)
AST: 16 U/L (ref 15–41)
Albumin: 2.6 g/dL — ABNORMAL LOW (ref 3.5–5.0)
Alkaline Phosphatase: 62 U/L (ref 38–126)
Anion gap: 10 (ref 5–15)
BUN: 14 mg/dL (ref 8–23)
CO2: 24 mmol/L (ref 22–32)
Calcium: 9.3 mg/dL (ref 8.9–10.3)
Chloride: 100 mmol/L (ref 98–111)
Creatinine, Ser: 0.67 mg/dL (ref 0.44–1.00)
GFR, Estimated: >60 mL/min (ref >60)
Glucose, Bld: 177 mg/dL — ABNORMAL HIGH (ref 70–99)
Potassium: 3.8 mmol/L (ref 3.5–5.1)
Sodium: 134 mmol/L — ABNORMAL LOW (ref 135–145)
Total Bilirubin: <0.1 mg/dL — ABNORMAL LOW (ref 0.3–1.2)
Total Protein: 6.3 g/dL — ABNORMAL LOW (ref 6.5–8.1)

## 2022-10-01 LAB — URINALYSIS, W/ REFLEX TO CULTURE (INFECTION SUSPECTED)
Bilirubin Urine: NEGATIVE
Glucose, UA: NEGATIVE mg/dL
Ketones, ur: NEGATIVE mg/dL
Leukocytes,Ua: NEGATIVE
Nitrite: NEGATIVE
Protein, ur: 30 mg/dL — AB
Specific Gravity, Urine: 1.01 (ref 1.005–1.030)
pH: 5 (ref 5.0–8.0)

## 2022-10-01 LAB — GLUCOSE, CAPILLARY: Glucose-Capillary: 108 mg/dL — ABNORMAL HIGH (ref 70–99)

## 2022-10-01 LAB — CBG MONITORING, ED: Glucose-Capillary: 154 mg/dL — ABNORMAL HIGH (ref 70–99)

## 2022-10-01 LAB — PROTIME-INR
INR: 1 (ref 0.8–1.2)
INR: 1.1 (ref 0.8–1.2)
Prothrombin Time: 13.3 seconds (ref 11.4–15.2)
Prothrombin Time: 13.8 seconds (ref 11.4–15.2)

## 2022-10-01 LAB — APTT: aPTT: 30 seconds (ref 24–36)

## 2022-10-01 LAB — LACTIC ACID, PLASMA
Lactic Acid, Venous: 1 mmol/L (ref 0.5–1.9)
Lactic Acid, Venous: 0.7 mmol/L (ref 0.5–1.9)

## 2022-10-01 LAB — AMMONIA: Ammonia: 17 umol/L (ref 9–35)

## 2022-10-01 MED ORDER — CLOPIDOGREL BISULFATE 75 MG PO TABS
75.0000 mg | ORAL_TABLET | Freq: Every day | ORAL | Status: DC
  Administered 2022-10-02 – 2022-10-06 (×5): 75 mg via ORAL
  Filled 2022-10-01 (×5): qty 1

## 2022-10-01 MED ORDER — ACETAMINOPHEN 650 MG RE SUPP: 650.0000 mg | Freq: Four times a day (QID) | RECTAL | Status: DC | PRN

## 2022-10-01 MED ORDER — SODIUM CHLORIDE 0.9 % IV SOLN
2.0000 g | Freq: Two times a day (BID) | INTRAVENOUS | Status: DC
  Administered 2022-10-01: 2 g via INTRAVENOUS
  Filled 2022-10-01: qty 12.5

## 2022-10-01 MED ORDER — INSULIN GLARGINE-YFGN 100 UNIT/ML ~~LOC~~ SOLN
5.0000 [IU] | Freq: Two times a day (BID) | SUBCUTANEOUS | Status: DC
  Administered 2022-10-02 – 2022-10-04 (×6): 5 [IU] via SUBCUTANEOUS
  Filled 2022-10-01 (×13): qty 0.05

## 2022-10-01 MED ORDER — METRONIDAZOLE 500 MG/100ML IV SOLN
500.0000 mg | Freq: Two times a day (BID) | INTRAVENOUS | Status: DC
  Administered 2022-10-01: 500 mg via INTRAVENOUS
  Filled 2022-10-01: qty 100

## 2022-10-01 MED ORDER — INSULIN ASPART 100 UNIT/ML IJ SOLN
3.0000 [IU] | Freq: Three times a day (TID) | INTRAMUSCULAR | Status: DC
  Administered 2022-10-02 – 2022-10-03 (×4): 3 [IU] via SUBCUTANEOUS

## 2022-10-01 MED ORDER — LACTATED RINGERS IV BOLUS (SEPSIS)
1000.0000 mL | Freq: Once | INTRAVENOUS | Status: AC
  Administered 2022-10-01: 1000 mL via INTRAVENOUS

## 2022-10-01 MED ORDER — CEFEPIME IV (FOR PTA / DISCHARGE USE ONLY): 2.0000 g | Freq: Three times a day (TID) | INTRAVENOUS | Status: DC

## 2022-10-01 MED ORDER — ENOXAPARIN SODIUM 40 MG/0.4ML IJ SOSY
40.0000 mg | PREFILLED_SYRINGE | INTRAMUSCULAR | Status: DC
  Administered 2022-10-01 – 2022-10-05 (×5): 40 mg via SUBCUTANEOUS
  Filled 2022-10-01 (×5): qty 0.4

## 2022-10-01 MED ORDER — METRONIDAZOLE 500 MG PO TABS: 500.0000 mg | ORAL_TABLET | Freq: Two times a day (BID) | ORAL | Status: DC

## 2022-10-01 MED ORDER — MIRTAZAPINE 15 MG PO TABS
7.5000 mg | ORAL_TABLET | Freq: Every day | ORAL | Status: DC
  Administered 2022-10-02 – 2022-10-05 (×4): 7.5 mg via ORAL
  Filled 2022-10-01 (×4): qty 1

## 2022-10-01 MED ORDER — ALBUTEROL SULFATE (2.5 MG/3ML) 0.083% IN NEBU: 2.5000 mg | INHALATION_SOLUTION | Freq: Four times a day (QID) | RESPIRATORY_TRACT | Status: DC | PRN

## 2022-10-01 MED ORDER — TRAZODONE HCL 50 MG PO TABS
25.0000 mg | ORAL_TABLET | Freq: Every evening | ORAL | Status: DC | PRN
  Administered 2022-10-05: 25 mg via ORAL
  Filled 2022-10-01: qty 1

## 2022-10-01 MED ORDER — ATORVASTATIN CALCIUM 80 MG PO TABS
80.0000 mg | ORAL_TABLET | Freq: Every day | ORAL | Status: DC
  Administered 2022-10-01 – 2022-10-05 (×5): 80 mg via ORAL
  Filled 2022-10-01 (×7): qty 1

## 2022-10-01 MED ORDER — LACTATED RINGERS IV SOLN
INTRAVENOUS | Status: DC
Start: 2022-10-01 — End: 2022-10-06

## 2022-10-01 MED ORDER — HYDRALAZINE HCL 50 MG PO TABS
50.0000 mg | ORAL_TABLET | Freq: Three times a day (TID) | ORAL | Status: DC
  Administered 2022-10-01 – 2022-10-06 (×14): 50 mg via ORAL
  Filled 2022-10-01 (×14): qty 1

## 2022-10-01 MED ORDER — ACETAMINOPHEN 325 MG PO TABS
650.0000 mg | ORAL_TABLET | Freq: Four times a day (QID) | ORAL | Status: DC | PRN
  Administered 2022-10-02 – 2022-10-06 (×6): 650 mg via ORAL
  Filled 2022-10-01 (×6): qty 2

## 2022-10-01 MED ORDER — CARVEDILOL 6.25 MG PO TABS
6.2500 mg | ORAL_TABLET | Freq: Two times a day (BID) | ORAL | Status: DC
  Administered 2022-10-02 – 2022-10-06 (×9): 6.25 mg via ORAL
  Filled 2022-10-01 (×9): qty 1

## 2022-10-01 MED ORDER — ESCITALOPRAM OXALATE 10 MG PO TABS
10.0000 mg | ORAL_TABLET | Freq: Every day | ORAL | Status: DC
  Administered 2022-10-02 – 2022-10-06 (×5): 10 mg via ORAL
  Filled 2022-10-01 (×6): qty 1

## 2022-10-01 MED ORDER — SODIUM CHLORIDE 0.9% FLUSH
3.0000 mL | Freq: Two times a day (BID) | INTRAVENOUS | Status: DC
  Administered 2022-10-01 – 2022-10-05 (×3): 3 mL via INTRAVENOUS

## 2022-10-01 MED ORDER — INSULIN ASPART 100 UNIT/ML IJ SOLN
0.0000 [IU] | Freq: Three times a day (TID) | INTRAMUSCULAR | Status: DC
  Administered 2022-10-02 (×3): 3 [IU] via SUBCUTANEOUS
  Administered 2022-10-03: 5 [IU] via SUBCUTANEOUS
  Administered 2022-10-03 (×2): 3 [IU] via SUBCUTANEOUS

## 2022-10-01 MED ORDER — AMLODIPINE BESYLATE 10 MG PO TABS
10.0000 mg | ORAL_TABLET | Freq: Every day | ORAL | Status: DC
  Administered 2022-10-02 – 2022-10-06 (×5): 10 mg via ORAL
  Filled 2022-10-01 (×5): qty 1

## 2022-10-01 NOTE — ED Triage Notes (Signed)
Pt BIB EMS due to AMS. Pt recently left with dx of encephalopathy. Facility states she wouldn't take meds or talk which is abnormal. Pt saying "ow" only on arrival. Pt has wound vac.

## 2022-10-01 NOTE — H&P (Signed)
History and Physical    Patient: Heather [name] EHM:[id number] DOB: 05-Feb-1951 DOA: 10/01/2022 DOS: the patient was seen and examined on 10/01/2022 PCP: [name], [place]  Patient coming from: SNF via EMS  Chief Complaint:  Chief Complaint  Patient presents with   Altered Mental Status   HPI: Heather [name] is a 72 y.o. female with medical history significant of   DM-2, diastolic CHF, COPD, HTN, HLD, GERD, PAD, AS s/p TAVR, tobacco use disorder, anxiety, depression, and schizophrenia who presents back to the hospital after she was not talking or taking any of her medications.  Patient had just recently been hospitalized from 2/3-2/7 for acute metabolic encephalopathy thought secondary to dehydration due to exposure of hot temperatures at SNF along with elevated blood sugars as daughter reported she was not on the appropriate diet.  Workup included MRI, EEG, chest x-ray, TSH, ammonia, and UA appear to be stable. She was discharged to the same SNF as had come from previously.  Prior to that she had been in the hospital from 08/04/2022-09/22/2022 initially admitted to the ICU for hemorrhagic cerebral stroke and bilateral lower extremity ischemia.  She then underwent bilateral lower extremity femoral endarterectomy and patch angioplasty with iliac stenting and 4 compartment fasciotomies.  She had been on admit and on Cleviprex.  Eventually able to be extubated and transferred to the hospitalist service on 12/17.  She had gone left AKA and excisional debridement of bilateral groin wounds with wound VAC placement.  Patient has been placed on antibiotics cefepime and Flagyl per ID and vascular surgery with plan on continuing until 3/12/25/22.  In the emergency department patient was noted to be afebrile with mild tachypnea and blood pressure 99/47-173/75, and all other vital signs maintained.  A CMP had been obtained which noted WBC 6.6, hemoglobin 11.2, platelets 434 sodium 134, glucose 177, albumin 2.6, lactic  acid negative x 2, and all other labs relatively within normal limits. CT scan of the head did not note any acute abnormality.  Chest x-ray noted no acute abnormality.  Patient has been given 2 L of lactated Ringer's.     Review of Systems: unable to review all systems due to the inability of the patient to answer questions. Past Medical History:  Diagnosis Date   (HFpEF) heart failure with preserved ejection fraction (HCC)    Angina pectoris (HCC)    Anxiety    Aortic atherosclerosis (HCC)    Basal cell carcinoma    Bilateral carotid artery disease (HCC)    a.) carotid doppler 03/18/2021: 3-66% RICA, 29-47% LICA   Bipolar disorder (HCC)    Cervicalgia    Chronic mesenteric ischemia (HCC)    a.) s/p PTA 11/13/2020 --> 6 x 16 mm lifestream ballon expanding stent to SMA   COPD (chronic obstructive pulmonary disease) (HCC)    Coronary artery disease 03/20/2014   a.) LHC/PCI 03/20/2014: 70% mLAD (2.5 x 28 mm Xience Alpine DES), 70% dLCx, 70% dRCA; b.) LHC 01/25/2016: 90% ISR mLAD --> mLAD dissection with in stent --> 2.75 x 22 mm Resolute DES; c.) R/LHC 09/29/2016: 50% ISR mLAD, 70% dLAD, mPA 37, PCWP 24, AO sat 90%, PA sat 46%, CO 3.7, CI 2.46 - med mgmt   DDD (degenerative disc disease), cervical    Depression    Diabetic polyneuropathy (HCC)    GERD (gastroesophageal reflux disease)    Hyperlipidemia    Hypertension    Long term current use of antithrombotics/antiplatelets    a.) on DAPT (ASA + clopidogrel)  Memory loss    NSTEMI (non-ST elevated myocardial infarction) ([place]) 07/2016   a.) troponins trended (normal < 0.034 ng/mL): 0.325 --> 1.480 --> 2.330 --> 1.660 --> 0.169 ng/mL   PAD (peripheral artery disease) ([place])    a.) s/p PTA and stenting SMA 11/13/2020; b.) s/p PTA and stenting of RIGHT SFA and popliteal 04/12/2021; c.) s/p PTA and kissing balloon stents to BILATERAL CIAs 05/28/2021   Respiratory failure with hypoxia (HCC)    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   a.) 23 mm Sapien 3 via suprasternal approach   Schizophrenia ([place])    Severe aortic stenosis    a.) s/p TAVR 09/30/2016; 23 mm Sapien 3 via a suprasternal approach   Type 2 diabetes mellitus treated with insulin ([place])    Vertigo    Past Surgical History:  Procedure Laterality Date   AMPUTATION Left 08/14/2022   Procedure: ABOVE KNEE AMPUTATION;  Surgeon: Waynetta Sandy, MD;  Location: [facility];  Service: Vascular;  Laterality: Left;   AORTIC VALVE REPLACEMENT  09/30/2016   Procedure: TRANSCATHETER AORTIC VALVE REPAIR   APPLICATION OF WOUND VAC Bilateral 08/14/2022   Procedure: APPLICATION OF WOUND VAC;  Surgeon: Waynetta Sandy, MD;  Location: [facility];  Service: Vascular;  Laterality: Bilateral;   BLADDER SURGERY     CATARACT EXTRACTION, BILATERAL     CHOLECYSTECTOMY     COLONOSCOPY     CORONARY ANGIOPLASTY WITH STENT PLACEMENT  03/20/2014   Procedure: CORONARY ANGIOPLASTY WITH STENT PLACEMENT; Location: [facility]; Surgeon: Jacques Earthly, MD   CORONARY ANGIOPLASTY WITH STENT PLACEMENT Left 01/25/2016   Procedure: CORONARY ANGIOPLASTY WITH STENT PLACEMENT; Location: [facility]; Surgeon: Jacques Earthly, MD   ENDARTERECTOMY FEMORAL Bilateral 08/04/2022   Procedure: BILATERAL FEMORAL ENDARTERECTOMY;  Surgeon: Waynetta Sandy, MD;  Location: [facility];  Service: Vascular;  Laterality: Bilateral;   FASCIECTOMY Bilateral 08/04/2022   Procedure: BILATERAL LOWER EXTREMITY FASCIECTOMIES;  Surgeon: Waynetta Sandy, MD;  Location: [facility];  Service: Vascular;  Laterality: Bilateral;   FOOT SURGERY Right    GROIN DEBRIDEMENT Bilateral 08/14/2022   Procedure: GROIN DEBRIDEMENT;  Surgeon: Waynetta Sandy, MD;  Location: [facility];  Service: Vascular;  Laterality: Bilateral;   GROIN DEBRIDEMENT Bilateral 09/16/2022   Procedure: GROIN DEBRIDEMENT POSSIBLE VAC PLACMENT;  Surgeon: Broadus John, MD;  Location: [facility];  Service: Vascular;  Laterality:  Bilateral;   HEMORRHOID SURGERY     INSERTION OF ILIAC STENT Bilateral 08/04/2022   Procedure: INSERTION OF BILATERAL ILIAC STENTS;  Surgeon: Waynetta Sandy, MD;  Location: [facility];  Service: Vascular;  Laterality: Bilateral;   IRRIGATION AND DEBRIDEMENT HEMATOMA Left 07/08/2014   GROIN   LOWER EXTREMITY ANGIOGRAM Bilateral 08/04/2022   Procedure: LOWER EXTREMITY ANGIOGRAM;  Surgeon: Waynetta Sandy, MD;  Location: [facility];  Service: Vascular;  Laterality: Bilateral;   LOWER EXTREMITY ANGIOGRAPHY Right 04/12/2021   Procedure: Lower Extremity Angiography;  Surgeon: [name], MD;  Location: [facility];  Service: Cardiovascular;  Laterality: Right;   LOWER EXTREMITY ANGIOGRAPHY Left 05/28/2021   Procedure: LOWER EXTREMITY ANGIOGRAPHY;  Surgeon: [name], MD;  Location: [facility];  Service: Cardiovascular;  Laterality: Left;   LOWER EXTREMITY ANGIOGRAPHY Left 03/25/2022   Procedure: Lower Extremity Angiography;  Surgeon: [name], MD;  Location: [facility];  Service: Cardiovascular;  Laterality: Left;   PATCH ANGIOPLASTY  08/04/2022   Procedure: LEFT FEMORAL PATCH ANGIOPLASTY USING XENOSURE BIOLOGIC PATCH;  Surgeon: Servando Snare  Harrell Gave, MD;  Location: [facility];  Service: Vascular;;   PSEUDOANEURYSM REPAIR Left 06/30/2014   FEMORAL ARTERY   RIGHT/LEFT HEART CATH AND CORONARY ANGIOGRAPHY Bilateral 09/29/2016   Procedure: RIGHT/LEFT HEART CATH AND CORONARY ANGIOGRAPHY; Location [facility]; Surgeon: Jacques Earthly, MD   THROMBECTOMY FEMORAL ARTERY Bilateral 08/04/2022   Procedure: BILATERAL LOWER EXTREMITY THROMBECTOMY;  Surgeon: Waynetta Sandy, MD;  Location: [facility];  Service: Vascular;  Laterality: Bilateral;   TUBAL LIGATION     VISCERAL ANGIOGRAPHY N/A 11/13/2020   Procedure: VISCERAL ANGIOGRAPHY;  Surgeon: [name], MD;  Location: [facility];  Service: Cardiovascular;  Laterality: N/A;    Social History:  reports that she has been smoking cigarettes. She has a 72.00 pack-year smoking history. She has never used smokeless tobacco. She reports that she does not currently use alcohol. She reports that she does not currently use drugs.  Allergies  Allergen Reactions   Iodinated Contrast Media Hives   Sulfa Antibiotics Hives   Shellfish Allergy Nausea And Vomiting and Rash    Scallops specifically     Family History  Problem Relation Age of Onset   Mental illness Mother    Breast cancer Neg Hx     Prior to Admission medications   Medication Sig Start Date End Date Taking? Authorizing Provider  acetaminophen (TYLENOL) 325 MG tablet Take 2 tablets (650 mg total) by mouth every 6 (six) hours as needed for mild pain (or Fever >/= 101). 09/29/22   Thurnell Lose, MD  albuterol (VENTOLIN HFA) 108 (90 Base) MCG/ACT inhaler Inhale 2 puffs into the lungs every 4 (four) hours as needed for wheezing or shortness of breath.    [provider]  amLODipine (NORVASC) 10 MG tablet Take 1 tablet (10 mg total) by mouth daily. 09/29/22 09/29/23  Thurnell Lose, MD  atorvastatin (LIPITOR) 80 MG tablet Take 80 mg by mouth at bedtime.    [provider]  carvedilol (COREG) 6.25 MG tablet Take 1 tablet (6.25 mg total) by mouth 2 (two) times daily with a meal. 09/22/22 10/22/22  Elodia Florence., MD  ceFEPime (MAXIPIME) IVPB Inject 2 g into the vein every 8 (eight) hours. Indication:  infected graft  First Dose: Yes Last Day of Therapy:  10/28/22 Labs - Once weekly:  CBC/D and BMP, Labs - Every other week:  ESR and CRP Method of administration: IV Push Remove PICC line at completion of antibiotic therapy Method of administration may be changed at the discretion of home infusion pharmacist based upon assessment of the patient and/or caregiver's ability to self-administer the medication ordered. Patient taking differently: Inject 2 g into the vein every 8 (eight) hours.  09/22/22 11/01/22  Elodia Florence., MD  cetirizine (ZYRTEC) 10 MG tablet Take 10 mg by mouth daily.    [provider]  cholecalciferol (VITAMIN D3) 25 MCG (1000 UNIT) tablet Take 1,000 Units by mouth daily.    [provider]  clopidogrel (PLAVIX) 75 MG tablet Take 1 tablet (75 mg total) by mouth daily. 11/15/20   Regalado, Belkys A, MD  docusate sodium (COLACE) 100 MG capsule Take 1 capsule (100 mg total) by mouth daily. 09/30/22 10/30/22  Thurnell Lose, MD  dronabinol (MARINOL) 2.5 MG capsule Take 1 capsule (2.5 mg total) by mouth 2 (two) times daily before lunch and supper. 09/22/22 10/22/22  Elodia Florence., MD  escitalopram (LEXAPRO) 10 MG tablet Take 1 tablet (10 mg total) by mouth daily. 09/22/22  10/22/22  Elodia Florence., MD  fluticasone [facility]) 50 MCG/ACT nasal spray Place 1 spray into both nostrils daily as needed for allergies.    [provider]  fluticasone-salmeterol (ADVAIR) 250-50 MCG/ACT AEPB Inhale 1 puff into the lungs daily.    [provider]  heparin lock flush 100 UNIT/ML SOLN injection Inject 10 Units into the vein 3 (three) times daily. Use SASH method with IV ABT    [provider]  hydrALAZINE (APRESOLINE) 50 MG tablet Take 1 tablet (50 mg total) by mouth 3 (three) times daily. 09/29/22 10/29/22  Thurnell Lose, MD  insulin aspart (NOVOLOG) 100 UNIT/ML FlexPen Before each meal 3 times a day, 140-199 - 2 units, 200-250 - 4 units, 251-299 - 8 units,  300-349 - 10 units,  350 or above 12 units. I 09/29/22   Thurnell Lose, MD  insulin aspart (NOVOLOG) 100 UNIT/ML injection Inject 3 Units into the skin 3 (three) times daily with meals. Hold for NPO or consuming less than 50% of meals Patient taking differently: Inject 3 Units into the skin See admin instructions. Inject 3 units subcutaneously with meals. HOLD for NPO or consuming < 50% of meals. 09/22/22   Elodia Florence., MD  insulin glargine (LANTUS) 100  UNIT/ML injection Inject 0.1 mLs (10 Units total) into the skin 2 (two) times daily. 09/29/22   Thurnell Lose, MD  metroNIDAZOLE (FLAGYL) 500 MG tablet Take 1 tablet (500 mg total) by mouth every 12 (twelve) hours. 09/22/22 10/28/22  Elodia Florence., MD  mirtazapine (REMERON) 7.5 MG tablet Take 1 tablet (7.5 mg total) by mouth at bedtime. 09/22/22 10/22/22  Elodia Florence., MD  Multiple Vitamins-Minerals (MULTIVITAMIN WITH MINERALS) tablet Take 1 tablet by mouth daily.    [provider]  pantoprazole (PROTONIX) 40 MG tablet Take 1 tablet (40 mg total) by mouth daily. 09/22/22 10/22/22  Elodia Florence., MD  polyethylene glycol (MIRALAX / GLYCOLAX) 17 g packet Take 17 g by mouth daily. Titrate as needed for constipation. Patient taking differently: Take 17 g by mouth daily. 09/22/22   Elodia Florence., MD  senna-docusate (SENOKOT-S) 8.6-50 MG tablet Take 1 tablet by mouth daily. 09/30/22   Thurnell Lose, MD  sodium chloride flush 0.9 % SOLN injection Inject 10 mLs into the vein 3 (three) times daily. Use SASH method with IV ABT    [provider]  traMADol (ULTRAM) 50 MG tablet Take 1 tablet (50 mg total) by mouth every 8 (eight) hours as needed for moderate pain or severe pain. 09/30/22   Thurnell Lose, MD  traZODone (DESYREL) 50 MG tablet Take 0.5 tablets (25 mg total) by mouth at bedtime as needed for sleep. 09/22/22 10/22/22  Elodia Florence., MD    Physical Exam: Vitals:   10/01/22 1132 10/01/22 1400 10/01/22 1415 10/01/22 1430  BP: 137/63 (!) 173/75 (!) 165/64 (!) 154/65  Pulse: 81 83 89 80  Resp: '13 13 12 12  '$ Temp: 98 F (36.7 C)     SpO2: 98% 97% 98% 96%    Constitutional: Elderly female who is chronically ill-appearing and not currently responding to voice, but able to follow simple commands and seems. Eyes: PERRL, lids and conjunctivae normal ENMT: Mucous membranes are dry. Neck: normal, supple  Respiratory: clear to auscultation  bilaterally, no wheezing, no crackles. Normal respiratory effort.    Cardiovascular: Regular rate and rhythm, no murmurs / rubs / gallops.  Left arm PICC line in place Abdomen: no tenderness, no masses palpated.  Bowel sounds positive.  Musculoskeletal:  Left AKA  Skin: Groin wound VAC in place Neurologic: CN 2-12 grossly intact.   Psychiatric: Alert, but unable to assess for orientation  Data Reviewed:  Reviewed labs, imaging, and pertinent records as noted above in HPI  Assessment and Plan: Acute metabolic encephalopathy Patient presented back to the hospital after just being discharged 1 days ago for the same.  Nursing facility reported patient not talking or taking medications.  Previously thought secondary to dehydration as workup including MRI, EEG, chest x-ray, TSH, ammonia, and UA did not show clear cause for symptoms.  Repeat CT scan of the head did not note any abnormality and patient appears to be afebrile. Blood glucose wasn't significantly elevated at this  -Admit to a medical telemetry bed -Neurochecks -Check UDS -Continue IV fluids  Right groin wound Patient had infected bilateral groin wound.  Plan is to continue cefepime and Flagyl with stop date 10/28/2022. -Continue cefepime and Flagyl  Normocytic anemia Hemoglobin 11.2 g/dL.  Which appears similar to prior. -Continue to monitor  Hypertension Blood pressures initially elevated up to 173/75 after receiving IV fluids. -Continue Coreg, hydralazine, and amlodipine as tolerated  Uncontrolled diabetes mellitus type 2 Last hemoglobin A1c 12.9 on 08/05/2022. -Continue prior regimen of Semglee 5 units twice daily -CBGs before every meal with moderate SSI and scheduled 3 units of NovoLog  Sacral decubitus ulcer -Low air loss mattress replacement -Continue wound  DVT prophylaxis: Lovenox Advance Care Planning:   Code Status: Full Code   Consults: None  Family Communication: Husband yesterday  Severity of  Illness: The appropriate patient status for this patient is OBSERVATION. Observation status is judged to be reasonable and necessary in order to provide the required intensity of service to ensure the patient's safety. The patient's presenting symptoms, physical exam findings, and initial radiographic and laboratory data in the context of their medical condition is felt to place them at decreased risk for further clinical deterioration. Furthermore, it is anticipated that the patient will be medically stable for discharge from the hospital within 2 midnights of admission.   Author: Norval Morton, MD 10/01/2022 2:59 PM  For on call review [email or web address].

## 2022-10-01 NOTE — ED Notes (Signed)
ED TO INPATIENT HANDOFF REPORT  ED Nurse Name and Phone #: [phone]  S Name/Age/Gender Heather [name] 72 y.o. female Room/Bed: 030C/030C  Code Status   Code Status: Prior  Home/SNF/Other Skilled nursing facility Patient oriented to: self, place, time, and situation Is this baseline? No   Triage Complete: Triage complete  Chief Complaint Failure to thrive in adult [H70.2] Acute metabolic encephalopathy [O37.85]  Triage Note Pt BIB EMS due to AMS. Pt recently left with dx of encephalopathy. Facility states she wouldn't take meds or talk which is abnormal. Pt saying "ow" only on arrival. Pt has wound vac.    Allergies Allergies  Allergen Reactions   Iodinated Contrast Media Hives   Sulfa Antibiotics Hives   Shellfish Allergy Nausea And Vomiting and Rash    Scallops specifically     Level of Care/Admitting Diagnosis ED Disposition     ED Disposition  Admit   Condition  --   Comment  Hospital Area: [place] [[id number]]  Level of Care: Telemetry Medical [104]  May place patient in observation at [facility] or [place] if equivalent level of care is available:: No  Covid Evaluation: Asymptomatic - no recent exposure (last 10 days) testing not required  Diagnosis: Acute metabolic encephalopathy [8850277]  Admitting Physician: Norval Morton [[id number]]  Attending Physician: Norval Morton [[id number]]          B Medical/Surgery History Past Medical History:  Diagnosis Date   (HFpEF) heart failure with preserved ejection fraction (HCC)    Angina pectoris (HCC)    Anxiety    Aortic atherosclerosis ([place])    Basal cell carcinoma    Bilateral carotid artery disease ([place])    a.) carotid doppler 03/18/2021: 7-09% RICA, 62-83% LICA   Bipolar disorder (HCC)    Cervicalgia    Chronic mesenteric ischemia (HCC)    a.) s/p PTA 11/13/2020 --> 6 x 16 mm lifestream ballon expanding stent to SMA   COPD (chronic obstructive pulmonary disease) (HCC)     Coronary artery disease 03/20/2014   a.) LHC/PCI 03/20/2014: 70% mLAD (2.5 x 28 mm Xience Alpine DES), 70% dLCx, 70% dRCA; b.) LHC 01/25/2016: 90% ISR mLAD --> mLAD dissection with in stent --> 2.75 x 22 mm Resolute DES; c.) R/LHC 09/29/2016: 50% ISR mLAD, 70% dLAD, mPA 37, PCWP 24, AO sat 90%, PA sat 46%, CO 3.7, CI 2.46 - med mgmt   DDD (degenerative disc disease), cervical    Depression    Diabetic polyneuropathy (HCC)    GERD (gastroesophageal reflux disease)    Hyperlipidemia    Hypertension    Long term current use of antithrombotics/antiplatelets    a.) on DAPT (ASA + clopidogrel)   Memory loss    NSTEMI (non-ST elevated myocardial infarction) ([place]) 07/2016   a.) troponins trended (normal < 0.034 ng/mL): 0.325 --> 1.480 --> 2.330 --> 1.660 --> 0.169 ng/mL   PAD (peripheral artery disease) ([place])    a.) s/p PTA and stenting SMA 11/13/2020; b.) s/p PTA and stenting of RIGHT SFA and popliteal 04/12/2021; c.) s/p PTA and kissing balloon stents to BILATERAL CIAs 05/28/2021   Respiratory failure with hypoxia (HCC)    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   a.) 23 mm Sapien 3 via suprasternal approach   Schizophrenia ([place])    Severe aortic stenosis    a.) s/p TAVR 09/30/2016; 23 mm Sapien 3 via a suprasternal approach   Type 2 diabetes mellitus treated with insulin ([place])  Vertigo    Past Surgical History:  Procedure Laterality Date   AMPUTATION Left 08/14/2022   Procedure: ABOVE KNEE AMPUTATION;  Surgeon: Waynetta Sandy, MD;  Location: [place];  Service: Vascular;  Laterality: Left;   AORTIC VALVE REPLACEMENT  09/30/2016   Procedure: TRANSCATHETER AORTIC VALVE REPAIR   APPLICATION OF WOUND VAC Bilateral 08/14/2022   Procedure: APPLICATION OF WOUND VAC;  Surgeon: Waynetta Sandy, MD;  Location: [place];  Service: Vascular;  Laterality: Bilateral;   BLADDER SURGERY     CATARACT EXTRACTION, BILATERAL     CHOLECYSTECTOMY     COLONOSCOPY      CORONARY ANGIOPLASTY WITH STENT PLACEMENT  03/20/2014   Procedure: CORONARY ANGIOPLASTY WITH STENT PLACEMENT; Location: [facility]; Surgeon: Jacques Earthly, MD   CORONARY ANGIOPLASTY WITH STENT PLACEMENT Left 01/25/2016   Procedure: CORONARY ANGIOPLASTY WITH STENT PLACEMENT; Location: [facility]; Surgeon: Jacques Earthly, MD   ENDARTERECTOMY FEMORAL Bilateral 08/04/2022   Procedure: BILATERAL FEMORAL ENDARTERECTOMY;  Surgeon: Waynetta Sandy, MD;  Location: [place];  Service: Vascular;  Laterality: Bilateral;   FASCIECTOMY Bilateral 08/04/2022   Procedure: BILATERAL LOWER EXTREMITY FASCIECTOMIES;  Surgeon: Waynetta Sandy, MD;  Location: [place];  Service: Vascular;  Laterality: Bilateral;   FOOT SURGERY Right    GROIN DEBRIDEMENT Bilateral 08/14/2022   Procedure: GROIN DEBRIDEMENT;  Surgeon: Waynetta Sandy, MD;  Location: [place];  Service: Vascular;  Laterality: Bilateral;   GROIN DEBRIDEMENT Bilateral 09/16/2022   Procedure: GROIN DEBRIDEMENT POSSIBLE VAC PLACMENT;  Surgeon: Broadus John, MD;  Location: [place];  Service: Vascular;  Laterality: Bilateral;   HEMORRHOID SURGERY     INSERTION OF ILIAC STENT Bilateral 08/04/2022   Procedure: INSERTION OF BILATERAL ILIAC STENTS;  Surgeon: Waynetta Sandy, MD;  Location: [place];  Service: Vascular;  Laterality: Bilateral;   IRRIGATION AND DEBRIDEMENT HEMATOMA Left 07/08/2014   GROIN   LOWER EXTREMITY ANGIOGRAM Bilateral 08/04/2022   Procedure: LOWER EXTREMITY ANGIOGRAM;  Surgeon: Waynetta Sandy, MD;  Location: [place];  Service: Vascular;  Laterality: Bilateral;   LOWER EXTREMITY ANGIOGRAPHY Right 04/12/2021   Procedure: Lower Extremity Angiography;  Surgeon: [name], MD;  Location: [place];  Service: Cardiovascular;  Laterality: Right;   LOWER EXTREMITY ANGIOGRAPHY Left 05/28/2021   Procedure: LOWER EXTREMITY ANGIOGRAPHY;  Surgeon: [name], MD;  Location: [facility] [facility];   Service: Cardiovascular;  Laterality: Left;   LOWER EXTREMITY ANGIOGRAPHY Left 03/25/2022   Procedure: Lower Extremity Angiography;  Surgeon: [name], MD;  Location: [place];  Service: Cardiovascular;  Laterality: Left;   PATCH ANGIOPLASTY  08/04/2022   Procedure: LEFT FEMORAL PATCH ANGIOPLASTY USING XENOSURE BIOLOGIC PATCH;  Surgeon: Waynetta Sandy, MD;  Location: [place];  Service: Vascular;;   PSEUDOANEURYSM REPAIR Left 06/30/2014   FEMORAL ARTERY   RIGHT/LEFT HEART CATH AND CORONARY ANGIOGRAPHY Bilateral 09/29/2016   Procedure: RIGHT/LEFT HEART CATH AND CORONARY ANGIOGRAPHY; Location [facility]; Surgeon: Jacques Earthly, MD   THROMBECTOMY FEMORAL ARTERY Bilateral 08/04/2022   Procedure: BILATERAL LOWER EXTREMITY THROMBECTOMY;  Surgeon: Waynetta Sandy, MD;  Location: [place];  Service: Vascular;  Laterality: Bilateral;   TUBAL LIGATION     VISCERAL ANGIOGRAPHY N/A 11/13/2020   Procedure: VISCERAL ANGIOGRAPHY;  Surgeon: [name], MD;  Location: [place] [facility];  Service: Cardiovascular;  Laterality: N/A;     A IV Location/Drains/Wounds Patient Lines/Drains/Airways Status     Active Line/Drains/Airways     Name Placement date Placement time Site  Days   PICC Double Lumen 81/19/14 Left Basilic 40 cm 0 cm 78/29/56  2100  -- 12   Negative Pressure Wound Therapy Groin Bilateral 09/16/22  1134  --  15   Pressure Injury 08/04/22 Sacrum Stage 2 -  Partial thickness loss of dermis presenting as a shallow open injury with a red, pink wound bed without slough. 08/04/22  0100  -- 58   Wound / Incision (Open or Dehisced) 08/30/22 Non-pressure wound Buttocks Left stage II on crease of buttocks 08/30/22  0800  Buttocks  32            Intake/Output Last 24 hours No intake or output data in the 24 hours ending 10/01/22 1626  Labs/Imaging Results for orders placed or performed during the hospital encounter of 10/01/22 (from the past 48 hour(s))   Lactic acid, plasma     Status: None   Collection Time: 10/01/22 12:08 PM  Result Value Ref Range   Lactic Acid, Venous 1.0 0.5 - 1.9 mmol/L    Comment: Performed at [facility] [address] [place], [place] [address]  Comprehensive metabolic panel     Status: Abnormal   Collection Time: 10/01/22 12:08 PM  Result Value Ref Range   Sodium 134 (L) 135 - 145 mmol/L   Potassium 3.8 3.5 - 5.1 mmol/L   Chloride 100 98 - 111 mmol/L   CO2 24 22 - 32 mmol/L   Glucose, Bld 177 (H) 70 - 99 mg/dL    Comment: Glucose reference range applies only to samples taken after fasting for at least 8 hours.   BUN 14 8 - 23 mg/dL   Creatinine, Ser 0.67 0.44 - 1.00 mg/dL   Calcium 9.3 8.9 - 10.3 mg/dL   Total Protein 6.3 (L) 6.5 - 8.1 g/dL   Albumin 2.6 (L) 3.5 - 5.0 g/dL   AST 16 15 - 41 U/L   ALT 9 0 - 44 U/L   Alkaline Phosphatase 62 38 - 126 U/L   Total Bilirubin <0.1 (L) 0.3 - 1.2 mg/dL   GFR, Estimated >60 >60 mL/min    Comment: (NOTE) Calculated using the CKD-EPI Creatinine Equation (2021)    Anion gap 10 5 - 15    Comment: Performed at [facility], [place] [address], [place], [place] [address]  Ammonia     Status: None   Collection Time: 10/01/22 12:08 PM  Result Value Ref Range   Ammonia 17 9 - 35 umol/L    Comment: Performed at [facility], [place] [address], [place], [place] [address]  Lactic acid, plasma     Status: None   Collection Time: 10/01/22  1:13 PM  Result Value Ref Range   Lactic Acid, Venous 0.7 0.5 - 1.9 mmol/L    Comment: Performed at [facility] [address] [place], [place] [address]  Protime-INR     Status: None   Collection Time: 10/01/22  1:13 PM  Result Value Ref Range   Prothrombin Time 13.3 11.4 - 15.2 seconds    Comment: SPECIMEN CLOTTED INFORMED A MCCORE RN 02.07.2024 1422 MTIU    INR 1.0 0.8 - 1.2    Comment: (NOTE) INR goal varies based on device and disease states. Performed at [facility], [place] [address],  [place], [place] [address]   APTT     Status: None   Collection Time: 10/01/22  1:13 PM  Result Value Ref Range   aPTT SPECIMEN CLOTTED  24 - 36 seconds    Comment: Wilhemina Cash [facility] RN 02.07.2024 1422 MTIU Performed at [facility], [place] [address] [place], [place] [address]   CBG monitoring, ED     Status: Abnormal   Collection Time: 10/01/22  1:15 PM  Result Value Ref Range   Glucose-Capillary 154 (H) 70 - 99 mg/dL    Comment: Glucose reference range applies only to samples taken after fasting for at least 8 hours.   Comment 1 Notify RN    Comment 2 Document in Chart   CBC with Differential/Platelet     Status: Abnormal   Collection Time: 10/01/22  2:45 PM  Result Value Ref Range   WBC 6.6 4.0 - 10.5 K/uL   RBC 3.62 (L) 3.87 - 5.11 MIL/uL   Hemoglobin 11.2 (L) 12.0 - 15.0 g/dL   HCT 33.5 (L) 36.0 - 46.0 %   MCV 92.5 80.0 - 100.0 fL   MCH 30.9 26.0 - 34.0 pg   MCHC 33.4 30.0 - 36.0 g/dL   RDW 16.7 (H) 11.5 - 15.5 %   Platelets 434 (H) 150 - 400 K/uL   nRBC 0.0 0.0 - 0.2 %   Neutrophils Relative % 65 %   Neutro Abs 4.4 1.7 - 7.7 K/uL   Lymphocytes Relative 17 %   Lymphs Abs 1.1 0.7 - 4.0 K/uL   Monocytes Relative 11 %   Monocytes Absolute 0.7 0.1 - 1.0 K/uL   Eosinophils Relative 5 %   Eosinophils Absolute 0.3 0.0 - 0.5 K/uL   Basophils Relative 1 %   Basophils Absolute 0.1 0.0 - 0.1 K/uL   Immature Granulocytes 1 %   Abs Immature Granulocytes 0.03 0.00 - 0.07 K/uL    Comment: Performed at [facility], [address] [place], [place] [address]  Protime-INR     Status: None   Collection Time: 10/01/22  2:45 PM  Result Value Ref Range   Prothrombin Time 13.8 11.4 - 15.2 seconds   INR 1.1 0.8 - 1.2    Comment: (NOTE) INR goal varies based on device and disease states. Performed at [facility], [place] [address] [place], [place] [address]   APTT     Status: None   Collection Time: 10/01/22  2:45 PM  Result Value Ref Range   aPTT 30 24 - 36 seconds     Comment: Performed at [facility] [address] [place], [place] [address]   CT Head Wo Contrast  Result Date: 10/01/2022 CLINICAL DATA:  Mental status change, unknown cause EXAM: CT HEAD WITHOUT CONTRAST TECHNIQUE: Contiguous axial images were obtained from the base of the skull through the vertex without intravenous contrast. RADIATION DOSE REDUCTION: This exam was performed according to the departmental dose-optimization program which includes automated exposure control, adjustment of the mA and/or kV according to patient size and/or use of iterative reconstruction technique. COMPARISON:  CT examination dated September 27, 2022 FINDINGS: Brain: No evidence of acute infarction, hemorrhage, hydrocephalus, extra-axial collection or mass lesion/mass effect. Cerebral atrophy and chronic microvascular ischemic changes of the white matter, unchanged. Vascular: No hyperdense vessel or unexpected calcification. Skull: Normal. Negative for fracture or focal lesion. Sinuses/Orbits: No acute finding. Other: None. IMPRESSION: 1. No acute intracranial abnormality. 2. Cerebral atrophy and chronic microvascular ischemic changes of the white matter, unchanged. Electronically Signed   By: Keane Police D.O.   On: 10/01/2022 12:50   DG Chest Port 1 View  Result Date: 10/01/2022 CLINICAL  DATA:  Sepsis. EXAM: PORTABLE CHEST 1 VIEW COMPARISON:  09/27/2022 FINDINGS: The cardiac silhouette, mediastinal and hilar contours are normal. Stable aortic calcifications. 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 Left coronary artery stent is noted. Left PICC line tip is in good position in the distal SVC. The lungs are clear of an acute process. No infiltrates or effusions. The bony thorax is intact. IMPRESSION: No acute cardiopulmonary findings. Electronically Signed   By: Marijo Sanes M.D.   On: 10/01/2022 12:34    Pending Labs Unresulted Labs (From admission, onward)     Start     Ordered   10/01/22 1300  CBC with  Differential/Platelet  Once,   STAT        10/01/22 1300   10/01/22 1208  CBC with Differential  (Undifferentiated presentation (screening labs and basic nursing orders))  ONCE - STAT,   STAT        10/01/22 1208   10/01/22 1208  Blood Culture (routine x 2)  (Undifferentiated presentation (screening labs and basic nursing orders))  BLOOD CULTURE X 2,   STAT      10/01/22 1208   10/01/22 1208  Urinalysis, w/ Reflex to Culture (Infection Suspected) -Urine, Clean Catch  (Undifferentiated presentation (screening labs and basic nursing orders))  Once,   URGENT       Question:  Specimen Source  Answer:  Urine, Clean Catch   10/01/22 1208            Vitals/Pain Today's Vitals   10/01/22 1132 10/01/22 1400 10/01/22 1415 10/01/22 1430  BP: 137/63 (!) 173/75 (!) 165/64 (!) 154/65  Pulse: 81 83 89 80  Resp: '13 13 12 12  '$ Temp: 98 F (36.7 C)     SpO2: 98% 97% 98% 96%  PainSc:        Isolation Precautions No active isolations  Medications Medications  lactated ringers infusion ( Intravenous New Bag/Given 10/01/22 1320)  lactated ringers bolus 1,000 mL (0 mLs Intravenous Stopped 10/01/22 1447)    Mobility      Focused Assessments     R Recommendations: See Admitting Provider Note  Report given to:   Additional Notes:

## 2022-10-01 NOTE — ED Notes (Signed)
Ready to receive pt.  Thank you

## 2022-10-01 NOTE — Progress Notes (Signed)
PHARMACY NOTE:  ANTIMICROBIAL RENAL DOSAGE ADJUSTMENT  Current antimicrobial regimen includes a mismatch between antimicrobial dosage and estimated renal function.  As per policy approved by the Pharmacy & Therapeutics and Medical Executive Committees, the antimicrobial dosage will be adjusted accordingly.  Current antimicrobial dosage:  cefepime 2g IV Q8H  Indication: infected graft  Renal Function:  Estimated Creatinine Clearance: 51.9 mL/min (by C-G formula based on SCr of 0.67 mg/dL). '[]'$      On intermittent HD, scheduled: '[]'$      On CRRT    Antimicrobial dosage has been changed to:  cefepime 2g IV Q12H  Additional comments:  Pt wt has changed from 71kg when OPAT order initially written, now 55.6kg which drops CrCl to ~50 ml/min.   Thank you for allowing pharmacy to be a part of this patient's care.  [name], PharmD, BCPS  10/01/2022 6:53 PM

## 2022-10-01 NOTE — Care Plan (Signed)
Pt arrived to room via stretcher and transport.  Pulled over to bed.  Pt is not A&O, VSS, lungs CTA with exception of bases that are diminished.  Murmur auscultated.  She has wound vac to bilateral groin with suction at 155mhg.  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  Foam dressing to stump removed and site cleaned. Incision well approximated.  3 small areas of granulation noted with no drainage noted.  Patient was soaked in urine on arrival.  She was in a diaper and it was soaked through the blankets and towels.  Removed the foam pad from her buttocks.  Erythema noted but otherwise, intact. Small BM noted.  Patient cleaned and purewick placed.  NADN. Sreece, RN

## 2022-10-01 NOTE — ED Provider Notes (Signed)
[facility] Provider Note   CSN: [id number] Arrival date & time: 10/01/22  1124     History  Chief Complaint  Patient presents with   Altered Mental Status    Heather [name] is a 72 y.o. female.  Patient just discharged yesterday for admission for metabolic encephalopathy.  Patient discharged to nursing facility.  Nursing home sending her back patient not talking.  Some question is whether patient is eating and drinking.  Patient known to have infected groin wounds is on an antibiotic therapy cefepime and the PICC line left upper extremity..  Patient's been in the hospital extensively.  Patient was in the hospital from December 11 through January 29.  Patient then was readmitted on February 2 through February 6 for altered mental status felt to be secondary to metabolic and stop encephalopathy.  Past medical here significant for type 2 diabetes diastolic congestive heart.  COPD hypertension hyperlipidemia GERD  Peripheral artery disease aortic stenosis status post TAVR tobacco use disorder anxiety depression and schizophrenia during her December her hospitalization in the January patient was admitted to the ICU for hemorrhagic cerebral stroke and bilateral lower extremity ischemia.  Underwent femoral endarterectomy and patch angioplasty with iliac stenting and 4 compartment fasciotomies.  Postop she remained on vent and clarify eventually she was extubated and transferred to the hospital service on December 17.  She also underwent a left AKA and excisional debridement of bilateral groin wounds with VAC placement on December 21.  Hospital course then noteworthy for episodes of hypoglycemia with insulin adjustments as initially required tube feed that was later discontinued remains physically deconditioned.  There was concerns about persistent greenish drainage with tunneling underwent groin debridement and wound VAC placement again on January 23  antibiotics broadened to IV cefepime and Flagyl and patient has PICC line in place.  Her hospitalization was also complicated by [id number] infection.  Nursing facility is stating that patient is not eating or drinking and that her mental status is normally that of talking.  They claimed that they also were resistant to her coming to the nursing facility and conversations they had with Dr. Candiss Norse yesterday.       Home Medications Prior to Admission medications   Medication Sig Start Date End Date Taking? Authorizing Provider  acetaminophen (TYLENOL) 325 MG tablet Take 2 tablets (650 mg total) by mouth every 6 (six) hours as needed for mild pain (or Fever >/= 101). 09/29/22   Thurnell Lose, MD  albuterol (VENTOLIN HFA) 108 (90 Base) MCG/ACT inhaler Inhale 2 puffs into the lungs every 4 (four) hours as needed for wheezing or shortness of breath.    [provider]  amLODipine (NORVASC) 10 MG tablet Take 1 tablet (10 mg total) by mouth daily. 09/29/22 09/29/23  Thurnell Lose, MD  atorvastatin (LIPITOR) 80 MG tablet Take 80 mg by mouth at bedtime.    [provider]  carvedilol (COREG) 6.25 MG tablet Take 1 tablet (6.25 mg total) by mouth 2 (two) times daily with a meal. 09/22/22 10/22/22  Elodia Florence., MD  ceFEPime (MAXIPIME) IVPB Inject 2 g into the vein every 8 (eight) hours. Indication:  infected graft  First Dose: Yes Last Day of Therapy:  10/28/22 Labs - Once weekly:  CBC/D and BMP, Labs - Every other week:  ESR and CRP Method of administration: IV Push Remove PICC line at completion of antibiotic therapy Method of administration may be changed at the discretion of  home infusion pharmacist based upon assessment of the patient and/or caregiver's ability to self-administer the medication ordered. Patient taking differently: Inject 2 g into the vein every 8 (eight) hours. 09/22/22 11/01/22  Elodia Florence., MD  cetirizine (ZYRTEC) 10 MG tablet Take 10 mg by mouth  daily.    [provider]  cholecalciferol (VITAMIN D3) 25 MCG (1000 UNIT) tablet Take 1,000 Units by mouth daily.    [provider]  clopidogrel (PLAVIX) 75 MG tablet Take 1 tablet (75 mg total) by mouth daily. 11/15/20   Regalado, Belkys A, MD  docusate sodium (COLACE) 100 MG capsule Take 1 capsule (100 mg total) by mouth daily. 09/30/22 10/30/22  Thurnell Lose, MD  dronabinol (MARINOL) 2.5 MG capsule Take 1 capsule (2.5 mg total) by mouth 2 (two) times daily before lunch and supper. 09/22/22 10/22/22  Elodia Florence., MD  escitalopram (LEXAPRO) 10 MG tablet Take 1 tablet (10 mg total) by mouth daily. 09/22/22 10/22/22  Elodia Florence., MD  fluticasone (FLONASE) 50 MCG/ACT nasal spray Place 1 spray into both nostrils daily as needed for allergies.    [provider]  fluticasone-salmeterol (ADVAIR) 250-50 MCG/ACT AEPB Inhale 1 puff into the lungs daily.    [provider]  heparin lock flush 100 UNIT/ML SOLN injection Inject 10 Units into the vein 3 (three) times daily. Use SASH method with IV ABT    [provider]  hydrALAZINE (APRESOLINE) 50 MG tablet Take 1 tablet (50 mg total) by mouth 3 (three) times daily. 09/29/22 10/29/22  Thurnell Lose, MD  insulin aspart (NOVOLOG) 100 UNIT/ML FlexPen Before each meal 3 times a day, 140-199 - 2 units, 200-250 - 4 units, 251-299 - 8 units,  300-349 - 10 units,  350 or above 12 units. I 09/29/22   Thurnell Lose, MD  insulin aspart (NOVOLOG) 100 UNIT/ML injection Inject 3 Units into the skin 3 (three) times daily with meals. Hold for NPO or consuming less than 50% of meals Patient taking differently: Inject 3 Units into the skin See admin instructions. Inject 3 units subcutaneously with meals. HOLD for NPO or consuming < 50% of meals. 09/22/22   Elodia Florence., MD  insulin glargine (LANTUS) 100 UNIT/ML injection Inject 0.1 mLs (10 Units total) into the skin 2 (two) times daily. 09/29/22   Thurnell Lose, MD  metroNIDAZOLE (FLAGYL) 500 MG tablet Take 1 tablet (500 mg total) by mouth every 12 (twelve) hours. 09/22/22 10/28/22  Elodia Florence., MD  mirtazapine (REMERON) 7.5 MG tablet Take 1 tablet (7.5 mg total) by mouth at bedtime. 09/22/22 10/22/22  Elodia Florence., MD  Multiple Vitamins-Minerals (MULTIVITAMIN WITH MINERALS) tablet Take 1 tablet by mouth daily.    [provider]  pantoprazole (PROTONIX) 40 MG tablet Take 1 tablet (40 mg total) by mouth daily. 09/22/22 10/22/22  Elodia Florence., MD  polyethylene glycol (MIRALAX / GLYCOLAX) 17 g packet Take 17 g by mouth daily. Titrate as needed for constipation. Patient taking differently: Take 17 g by mouth daily. 09/22/22   Elodia Florence., MD  senna-docusate (SENOKOT-S) 8.6-50 MG tablet Take 1 tablet by mouth daily. 09/30/22   Thurnell Lose, MD  sodium chloride flush 0.9 % SOLN injection Inject 10 mLs into the vein 3 (three) times daily. Use SASH method with IV ABT    [provider]  traMADol (ULTRAM) 50 MG tablet Take 1 tablet (50 mg  total) by mouth every 8 (eight) hours as needed for moderate pain or severe pain. 09/30/22   Thurnell Lose, MD  traZODone (DESYREL) 50 MG tablet Take 0.5 tablets (25 mg total) by mouth at bedtime as needed for sleep. 09/22/22 10/22/22  Elodia Florence., MD      Allergies    Iodinated contrast media, Sulfa antibiotics, and Shellfish allergy    Review of Systems   Review of Systems  Unable to perform ROS: Mental status change    Physical Exam Updated Vital Signs BP (!) 154/65   Pulse 80   Temp 98 F (36.7 C)   Resp 12   SpO2 96%  Physical Exam Vitals and nursing note reviewed.  Constitutional:      General: She is not in acute distress.    Appearance: Normal appearance. She is well-developed.  HENT:     Head: Normocephalic and atraumatic.     Mouth/Throat:     Mouth: Mucous membranes are dry.  Eyes:     Conjunctiva/sclera: Conjunctivae  normal.  Cardiovascular:     Rate and Rhythm: Normal rate and regular rhythm.     Heart sounds: No murmur heard. Pulmonary:     Effort: Pulmonary effort is normal. No respiratory distress.     Breath sounds: Normal breath sounds.  Abdominal:     Palpations: Abdomen is soft.     Tenderness: There is no abdominal tenderness.  Musculoskeletal:        General: No swelling.     Cervical back: Neck supple.     Comments: PICC line left arm  Skin:    General: Skin is warm and dry.     Capillary Refill: Capillary refill takes less than 2 seconds.  Neurological:     Mental Status: She is alert.     Comments: Patient awake but nonverbal  Psychiatric:        Mood and Affect: Mood normal.     ED Results / Procedures / Treatments   Labs (all labs ordered are listed, but only abnormal results are displayed) Labs Reviewed  COMPREHENSIVE METABOLIC PANEL - Abnormal; Notable for the following components:      Result Value   Sodium 134 (*)    Glucose, Bld 177 (*)    Total Protein 6.3 (*)    Albumin 2.6 (*)    Total Bilirubin <0.1 (*)    All other components within normal limits  CBG MONITORING, ED - Abnormal; Notable for the following components:   Glucose-Capillary 154 (*)    All other components within normal limits  CULTURE, BLOOD (ROUTINE X 2)  CULTURE, BLOOD (ROUTINE X 2)  LACTIC ACID, PLASMA  LACTIC ACID, PLASMA  AMMONIA  PROTIME-INR  APTT  CBC WITH DIFFERENTIAL/PLATELET  URINALYSIS, W/ REFLEX TO CULTURE (INFECTION SUSPECTED)  CBC WITH DIFFERENTIAL/PLATELET  CBC WITH DIFFERENTIAL/PLATELET  PROTIME-INR  APTT    EKG EKG Interpretation  Date/Time:  Wednesday October 01 2022 11:32:49 EST Ventricular Rate:  83 PR Interval:    QRS Duration: 89 QT Interval:  414 QTC Calculation: 487 R Axis:   70 Text Interpretation: Normal Sinus ST elevation, consider inferior injury Borderline prolonged QT interval Artifact in lead(s) I II aVR aVL Confirmed by Fredia Sorrow [phone]) on  10/01/2022 2:43:36 PM  Radiology CT Head Wo Contrast  Result Date: 10/01/2022 CLINICAL DATA:  Mental status change, unknown cause EXAM: CT HEAD WITHOUT CONTRAST TECHNIQUE: Contiguous axial images were obtained from the base of the skull through the vertex  without intravenous contrast. RADIATION DOSE REDUCTION: This exam was performed according to the departmental dose-optimization program which includes automated exposure control, adjustment of the mA and/or kV according to patient size and/or use of iterative reconstruction technique. COMPARISON:  CT examination dated September 27, 2022 FINDINGS: Brain: No evidence of acute infarction, hemorrhage, hydrocephalus, extra-axial collection or mass lesion/mass effect. Cerebral atrophy and chronic microvascular ischemic changes of the white matter, unchanged. Vascular: No hyperdense vessel or unexpected calcification. Skull: Normal. Negative for fracture or focal lesion. Sinuses/Orbits: No acute finding. Other: None. IMPRESSION: 1. No acute intracranial abnormality. 2. Cerebral atrophy and chronic microvascular ischemic changes of the white matter, unchanged. Electronically Signed   By: Keane Police D.O.   On: 10/01/2022 12:50   DG Chest Port 1 View  Result Date: 10/01/2022 CLINICAL DATA:  Sepsis. EXAM: PORTABLE CHEST 1 VIEW COMPARISON:  09/27/2022 FINDINGS: The cardiac silhouette, mediastinal and hilar contours are normal. Stable aortic calcifications. 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 Left coronary artery stent is noted. Left PICC line tip is in good position in the distal SVC. The lungs are clear of an acute process. No infiltrates or effusions. The bony thorax is intact. IMPRESSION: No acute cardiopulmonary findings. Electronically Signed   By: Marijo Sanes M.D.   On: 10/01/2022 12:34    Procedures Procedures    Medications Ordered in ED Medications  lactated ringers infusion ( Intravenous New Bag/Given 10/01/22 1320)  lactated ringers bolus 1,000 mL (0 mLs  Intravenous Stopped 10/01/22 1447)    ED Course/ Medical Decision Making/ A&P                             Medical Decision Making Amount and/or Complexity of Data Reviewed Labs: ordered. Radiology: ordered. ECG/medicine tests: ordered.  Risk Prescription drug management. Decision regarding hospitalization.   Patient appeared very dehydrated.  Patient normally talks according to nursing facility but patient not talking here.  Initial some concerns may be for sepsis.  Ordered sepsis labs but did not do fluids or antibiotics.  Patient is already on cefepime and through her PICC line in her left arm.  Patient CBC still pending.  Blood sugar was 154 lactic acid was normal at 0.2 complete metabolic panel liver function normal and GFR normal sodium 134 but no significant electrolyte abnormalities.  CBC still pending.  Ammonia level was 17 CT head without any acute findings and chest x-ray without any acute findings.  Discussed with hospitalist for failure to thrive.  They will see patient for consideration for readmission   Final Clinical Impression(s) / ED Diagnoses Final diagnoses:  Failure to thrive (child)  Altered mental status, unspecified altered mental status type    Rx / DC Orders ED Discharge Orders     None         Fredia Sorrow, MD 10/01/22 1523

## 2022-10-02 ENCOUNTER — Inpatient Hospital Stay (HOSPITAL_COMMUNITY): Payer: Medicare HMO

## 2022-10-02 DIAGNOSIS — E785 Hyperlipidemia, unspecified: Secondary | ICD-10-CM | POA: Diagnosis present

## 2022-10-02 DIAGNOSIS — I110 Hypertensive heart disease with heart failure: Secondary | ICD-10-CM | POA: Diagnosis present

## 2022-10-02 DIAGNOSIS — E1151 Type 2 diabetes mellitus with diabetic peripheral angiopathy without gangrene: Secondary | ICD-10-CM | POA: Diagnosis present

## 2022-10-02 DIAGNOSIS — J449 Chronic obstructive pulmonary disease, unspecified: Secondary | ICD-10-CM | POA: Diagnosis present

## 2022-10-02 DIAGNOSIS — L89322 Pressure ulcer of left buttock, stage 2: Secondary | ICD-10-CM | POA: Diagnosis present

## 2022-10-02 DIAGNOSIS — K219 Gastro-esophageal reflux disease without esophagitis: Secondary | ICD-10-CM | POA: Diagnosis present

## 2022-10-02 DIAGNOSIS — G40309 Generalized idiopathic epilepsy and epileptic syndromes, not intractable, without status epilepticus: Secondary | ICD-10-CM | POA: Diagnosis not present

## 2022-10-02 DIAGNOSIS — Z85828 Personal history of other malignant neoplasm of skin: Secondary | ICD-10-CM | POA: Diagnosis not present

## 2022-10-02 DIAGNOSIS — E1165 Type 2 diabetes mellitus with hyperglycemia: Secondary | ICD-10-CM | POA: Diagnosis present

## 2022-10-02 DIAGNOSIS — F17210 Nicotine dependence, cigarettes, uncomplicated: Secondary | ICD-10-CM | POA: Diagnosis present

## 2022-10-02 DIAGNOSIS — L89152 Pressure ulcer of sacral region, stage 2: Secondary | ICD-10-CM | POA: Diagnosis not present

## 2022-10-02 DIAGNOSIS — T8141XA Infection following a procedure, superficial incisional surgical site, initial encounter: Secondary | ICD-10-CM | POA: Diagnosis present

## 2022-10-02 DIAGNOSIS — Z8616 Personal history of COVID-19: Secondary | ICD-10-CM | POA: Diagnosis not present

## 2022-10-02 DIAGNOSIS — R4182 Altered mental status, unspecified: Secondary | ICD-10-CM

## 2022-10-02 DIAGNOSIS — I10 Essential (primary) hypertension: Secondary | ICD-10-CM | POA: Diagnosis not present

## 2022-10-02 DIAGNOSIS — F319 Bipolar disorder, unspecified: Secondary | ICD-10-CM | POA: Diagnosis present

## 2022-10-02 DIAGNOSIS — E1142 Type 2 diabetes mellitus with diabetic polyneuropathy: Secondary | ICD-10-CM | POA: Diagnosis present

## 2022-10-02 DIAGNOSIS — R6251 Failure to thrive (child): Secondary | ICD-10-CM | POA: Diagnosis not present

## 2022-10-02 DIAGNOSIS — I700 Atherosclerosis of aorta: Secondary | ICD-10-CM | POA: Diagnosis present

## 2022-10-02 DIAGNOSIS — D649 Anemia, unspecified: Secondary | ICD-10-CM | POA: Diagnosis present

## 2022-10-02 DIAGNOSIS — I5032 Chronic diastolic (congestive) heart failure: Secondary | ICD-10-CM | POA: Diagnosis present

## 2022-10-02 DIAGNOSIS — G9341 Metabolic encephalopathy: Secondary | ICD-10-CM | POA: Diagnosis not present

## 2022-10-02 DIAGNOSIS — Z794 Long term (current) use of insulin: Secondary | ICD-10-CM | POA: Diagnosis not present

## 2022-10-02 DIAGNOSIS — Z89612 Acquired absence of left leg above knee: Secondary | ICD-10-CM | POA: Diagnosis not present

## 2022-10-02 DIAGNOSIS — Z953 Presence of xenogenic heart valve: Secondary | ICD-10-CM | POA: Diagnosis not present

## 2022-10-02 DIAGNOSIS — Y838 Other surgical procedures as the cause of abnormal reaction of the patient, or of later complication, without mention of misadventure at the time of the procedure: Secondary | ICD-10-CM | POA: Diagnosis present

## 2022-10-02 DIAGNOSIS — G40901 Epilepsy, unspecified, not intractable, with status epilepticus: Secondary | ICD-10-CM | POA: Diagnosis present

## 2022-10-02 DIAGNOSIS — R627 Adult failure to thrive: Secondary | ICD-10-CM | POA: Diagnosis present

## 2022-10-02 DIAGNOSIS — G928 Other toxic encephalopathy: Secondary | ICD-10-CM | POA: Diagnosis present

## 2022-10-02 DIAGNOSIS — L89892 Pressure ulcer of other site, stage 2: Secondary | ICD-10-CM | POA: Diagnosis present

## 2022-10-02 LAB — CBC
HCT: 33.2 % — ABNORMAL LOW (ref 36.0–46.0)
Hemoglobin: 10.6 g/dL — ABNORMAL LOW (ref 12.0–15.0)
MCH: 29.1 pg (ref 26.0–34.0)
MCHC: 31.9 g/dL (ref 30.0–36.0)
MCV: 91.2 fL (ref 80.0–100.0)
Platelets: 442 10*3/uL — ABNORMAL HIGH (ref 150–400)
RBC: 3.64 MIL/uL — ABNORMAL LOW (ref 3.87–5.11)
RDW: 16.6 % — ABNORMAL HIGH (ref 11.5–15.5)
WBC: 6.2 10*3/uL (ref 4.0–10.5)
nRBC: 0 % (ref 0.0–0.2)

## 2022-10-02 LAB — RAPID URINE DRUG SCREEN, HOSP PERFORMED
Amphetamines: NOT DETECTED
Barbiturates: NOT DETECTED
Benzodiazepines: NOT DETECTED
Cocaine: NOT DETECTED
Opiates: NOT DETECTED
Tetrahydrocannabinol: POSITIVE — AB

## 2022-10-02 LAB — BASIC METABOLIC PANEL
Anion gap: 9 (ref 5–15)
BUN: 10 mg/dL (ref 8–23)
CO2: 26 mmol/L (ref 22–32)
Calcium: 9.3 mg/dL (ref 8.9–10.3)
Chloride: 100 mmol/L (ref 98–111)
Creatinine, Ser: 0.51 mg/dL (ref 0.44–1.00)
GFR, Estimated: >60 mL/min (ref >60)
Glucose, Bld: 120 mg/dL — ABNORMAL HIGH (ref 70–99)
Potassium: 3.5 mmol/L (ref 3.5–5.1)
Sodium: 135 mmol/L (ref 135–145)

## 2022-10-02 LAB — GLUCOSE, CAPILLARY
Glucose-Capillary: 162 mg/dL — ABNORMAL HIGH (ref 70–99)
Glucose-Capillary: 170 mg/dL — ABNORMAL HIGH (ref 70–99)
Glucose-Capillary: 197 mg/dL — ABNORMAL HIGH (ref 70–99)

## 2022-10-02 LAB — TSH: TSH: 3.025 u[IU]/mL (ref 0.350–4.500)

## 2022-10-02 LAB — RPR: RPR Ser Ql: NONREACTIVE

## 2022-10-02 LAB — HIV ANTIBODY (ROUTINE TESTING W REFLEX): HIV Screen 4th Generation wRfx: NONREACTIVE

## 2022-10-02 LAB — VITAMIN B12: Vitamin B-12: 526 pg/mL (ref 180–914)

## 2022-10-02 MED ORDER — POTASSIUM CHLORIDE CRYS ER 20 MEQ PO TBCR
40.0000 meq | EXTENDED_RELEASE_TABLET | Freq: Two times a day (BID) | ORAL | Status: AC
  Administered 2022-10-02 (×2): 40 meq via ORAL
  Filled 2022-10-02 (×2): qty 2

## 2022-10-02 MED ORDER — DOCUSATE SODIUM 100 MG PO CAPS
100.0000 mg | ORAL_CAPSULE | Freq: Every day | ORAL | Status: DC
  Administered 2022-10-02 – 2022-10-06 (×5): 100 mg via ORAL
  Filled 2022-10-02 (×5): qty 1

## 2022-10-02 MED ORDER — PIPERACILLIN-TAZOBACTAM 3.375 G IVPB
3.3750 g | Freq: Three times a day (TID) | INTRAVENOUS | Status: DC
Start: 2022-10-02 — End: 2022-10-06
  Administered 2022-10-02 – 2022-10-06 (×13): 3.375 g via INTRAVENOUS
  Filled 2022-10-02 (×13): qty 50

## 2022-10-02 MED ORDER — PANTOPRAZOLE SODIUM 40 MG PO TBEC
40.0000 mg | DELAYED_RELEASE_TABLET | Freq: Every day | ORAL | Status: DC
  Administered 2022-10-02 – 2022-10-06 (×5): 40 mg via ORAL
  Filled 2022-10-02 (×5): qty 1

## 2022-10-02 MED ORDER — DRONABINOL 2.5 MG PO CAPS
2.5000 mg | ORAL_CAPSULE | Freq: Two times a day (BID) | ORAL | Status: DC
  Administered 2022-10-02 – 2022-10-05 (×8): 2.5 mg via ORAL
  Filled 2022-10-02 (×8): qty 1

## 2022-10-02 MED ORDER — CHLORHEXIDINE GLUCONATE CLOTH 2 % EX PADS
6.0000 | MEDICATED_PAD | Freq: Every day | CUTANEOUS | Status: DC
  Administered 2022-10-02 – 2022-10-05 (×4): 6 via TOPICAL

## 2022-10-02 MED ORDER — SODIUM CHLORIDE 0.9% FLUSH: 10.0000 mL | INTRAVENOUS | Status: DC | PRN

## 2022-10-02 MED ORDER — LORAZEPAM 2 MG/ML IJ SOLN
2.0000 mg | Freq: Once | INTRAMUSCULAR | Status: AC
  Administered 2022-10-02: 2 mg via INTRAVENOUS
  Filled 2022-10-02: qty 1

## 2022-10-02 NOTE — Consult Note (Addendum)
Neurology Consultation  Reason for Consult: acute metabolic encephalopathy  Referring Physician: Dr. Venetia Constable  CC: AMS  History is obtained from: Chart review  HPI: Heather [name] is a 72 y.o. female  with PMHx of DM-2, diastolic CHF, COPD, HTN, HLD, GERD, PAD, AS s/p TAVR, tobacco use disorder, anxiety, depression, and schizophrenia who presents back to the hospital from SNF after AMS in the context of not talking or taking any of her medications. On exam, patient was not responding to questions except for some mumbling. She was tracking movement but was not following commands. In the morning exam, patient was unable to follow commands such as tongue protrusion but when seen a few hours later, patient was able to follow commands of tongue protrusion and keeping her arms lifted.   Premorbid modified Rankin scale (mRS):  0-Completely asymptomatic and back to baseline post-stroke 1-No significant post stroke disability and can perform usual duties with stroke symptoms 2-Slight disability-UNABLE to perform all activities but does not need assistance  3-Moderate disability-requires help but walks WITHOUT assistance 4-Needs assistance to walk and tend to bodily needs 5-Severe disability- incontinent, needs constant attention 6- Death  ROS: Unable to obtain due to altered mental status.   Past Medical History:  Diagnosis Date   (HFpEF) heart failure with preserved ejection fraction (HCC)    Angina pectoris (HCC)    Anxiety    Aortic atherosclerosis (HCC)    Basal cell carcinoma    Bilateral carotid artery disease (HCC)    a.) carotid doppler 03/18/2021: 4-09% RICA, 81-19% LICA   Bipolar disorder (HCC)    Cervicalgia    Chronic mesenteric ischemia (HCC)    a.) s/p PTA 11/13/2020 --> 6 x 16 mm lifestream ballon expanding stent to SMA   COPD (chronic obstructive pulmonary disease) (HCC)    Coronary artery disease 03/20/2014   a.) LHC/PCI 03/20/2014: 70% mLAD (2.5 x 28 mm Xience Alpine DES), 70%  dLCx, 70% dRCA; b.) LHC 01/25/2016: 90% ISR mLAD --> mLAD dissection with in stent --> 2.75 x 22 mm Resolute DES; c.) R/LHC 09/29/2016: 50% ISR mLAD, 70% dLAD, mPA 37, PCWP 24, AO sat 90%, PA sat 46%, CO 3.7, CI 2.46 - med mgmt   DDD (degenerative disc disease), cervical    Depression    Diabetic polyneuropathy (HCC)    GERD (gastroesophageal reflux disease)    Hyperlipidemia    Hypertension    Long term current use of antithrombotics/antiplatelets    a.) on DAPT (ASA + clopidogrel)   Memory loss    NSTEMI (non-ST elevated myocardial infarction) ([facility]) 07/2016   a.) troponins trended (normal < 0.034 ng/mL): 0.325 --> 1.480 --> 2.330 --> 1.660 --> 0.169 ng/mL   PAD (peripheral artery disease) ([facility])    a.) s/p PTA and stenting SMA 11/13/2020; b.) s/p PTA and stenting of RIGHT SFA and popliteal 04/12/2021; c.) s/p PTA and kissing balloon stents to BILATERAL CIAs 05/28/2021   Respiratory failure with hypoxia (HCC)    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 She has a 72.00 pack-year smoking history. She has never used smokeless tobacco. She reports that she does not currently use alcohol. She reports that she does not currently use drugs.  Medications  Current Facility-Administered Medications:    acetaminophen (TYLENOL) tablet 650 mg, 650 mg, Oral, Q6H PRN **OR** acetaminophen (TYLENOL) suppository 650 mg, 650 mg, Rectal, Q6H PRN, Tamala Julian, Rondell A, MD   albuterol (PROVENTIL) (2.5 MG/3ML) 0.083% nebulizer solution 2.5 mg, 2.5 mg, Nebulization, Q6H PRN, Tamala Julian, Rondell A,  MD   amLODipine (NORVASC) tablet 10 mg, 10 mg, Oral, Daily, Smith, Rondell A, MD   atorvastatin (LIPITOR) tablet 80 mg, 80 mg, Oral, QHS, Smith, Rondell A, MD, 80 mg at 10/01/22 2353   carvedilol (COREG) tablet 6.25 mg, 6.25 mg, Oral, BID WC, Smith, Rondell A, MD   Chlorhexidine Gluconate Cloth 2 % PADS 6 each, 6 each, Topical, Daily, Tamala Julian, Rondell A, MD   clopidogrel (PLAVIX) tablet 75 mg, 75 mg, Oral, Daily, Tamala Julian, Rondell A, MD   docusate sodium (COLACE) capsule 100 mg, 100 mg, Oral, Daily, Charlynne Cousins, MD   dronabinol (MARINOL) capsule 2.5 mg, 2.5 mg, Oral, BID AC, Charlynne Cousins, MD   enoxaparin (LOVENOX) injection 40 mg, 40 mg, Subcutaneous, Q24H, Smith, Rondell A, MD, 40 mg at 10/01/22 2201   escitalopram (LEXAPRO) tablet 10 mg, 10 mg, Oral, Daily, Tamala Julian, Rondell A, MD   hydrALAZINE (APRESOLINE) tablet 50 mg, 50 mg, Oral, Q8H, Smith, Rondell A, MD, 50 mg at 10/01/22 2353   insulin aspart (novoLOG) injection 0-15 Units, 0-15 Units, Subcutaneous, TID WC, Smith, Rondell A, MD   insulin aspart (novoLOG) injection 3 Units, 3 Units, Subcutaneous, TID WC, Smith, Rondell A, MD   insulin glargine-yfgn (SEMGLEE) injection 5 Units, 5 Units, Subcutaneous, BID, Tamala Julian, Rondell A, MD   lactated ringers infusion, , Intravenous, Continuous, Charlynne Cousins, MD, Last Rate: 100 mL/hr at 10/02/22 0658, New Bag at 10/02/22 0658   mirtazapine (REMERON) tablet 7.5 mg, 7.5 mg, Oral, QHS, Smith, Rondell A, MD   pantoprazole (PROTONIX) EC tablet 40 mg, 40 mg, Oral, Daily, Aileen Fass, Tammi Klippel, MD   piperacillin-tazobactam (ZOSYN) IVPB 3.375 g, 3.375 g, Intravenous, Q8H, Reome, Earle J, RPH   potassium chloride SA (KLOR-CON M) CR tablet 40 mEq, 40 mEq, Oral, BID, Charlynne Cousins, MD   sodium chloride flush (NS) 0.9 % injection 10-40 mL, 10-40 mL, Intracatheter, PRN, Tamala Julian, Rondell A, MD   sodium chloride flush (NS) 0.9 % injection 3 mL, 3 mL, Intravenous, Q12H, Tamala Julian, Rondell A, MD, 3 mL at  10/01/22 2207   traZODone (DESYREL) tablet 25 mg, 25 mg, Oral, QHS PRN, Norval Morton, MD   Exam: Current vital signs: BP (!) 145/88   Pulse 82   Temp 98.6 F (37 C)   Resp 18   SpO2 99%  Vital signs in last 24 hours: Temp:  [97.6 F (36.4 C)-100 F (37.8 C)] 98.6 F (37 C) (02/08 0342) Pulse Rate:  [80-89] 82 (02/08 0342) Resp:  [12-18] 18 (02/07 1725) BP: (135-173)/(55-102) 145/88 (02/08 0342) SpO2:  [96 %-100 %] 99 % (02/08 0342)  GENERAL: Awake, A&O x 0, NAD HEENT: - Normocephalic and atraumatic, dry mm, no LN++, no Thyromegally LUNGS - Clear to auscultation bilaterally with no wheezes CV - S1S2 RRR, no m/r/g, equal pulses bilaterally. ABDOMEN - Soft, nontender, nondistended with normoactive BS Ext: warm, well perfused, intact peripheral pulses, left AKA  NEURO:  Mental status, she is  awake, alert but does not follow any commands. She says yes to almost all questions. Cranial: Pupils are equal round react light, extraocular movements are intact, visual fields appear full to threat, face appears grossly symmetric. Motor examination with no drift in any of the 4 extremities-although she does not follow commands, if you raise her arms up, she keeps them raised up.  She has asterixis on outstretched arms bilaterally.  Lower extremity-has a left AKA and on the right, has strong withdrawal to noxious stimulation. Sensation is intact as evidenced by grimace and withdrawal. Coordination difficult to assess given her mentation and ability to follow commands.   Labs I have reviewed labs in epic and the results pertinent to this consultation are:  CBC    Component Value Date/Time   WBC 6.2 10/02/2022 0340   RBC 3.64 (L) 10/02/2022 0340   HGB 10.6 (L) 10/02/2022 0340   HGB 14.3 06/20/2012 2127   HCT 33.2 (L) 10/02/2022 0340   HCT 42.9 06/20/2012 2127   PLT 442 (H) 10/02/2022 0340   PLT 357 06/20/2012 2127   MCV 91.2 10/02/2022 0340   MCV 91 06/20/2012 2127   MCH 29.1  10/02/2022 0340   MCHC 31.9 10/02/2022 0340   RDW 16.6 (H) 10/02/2022 0340   RDW 13.6 06/20/2012 2127   LYMPHSABS 1.1 10/01/2022 1445   MONOABS 0.7 10/01/2022 1445   EOSABS 0.3 10/01/2022 1445   BASOSABS 0.1 10/01/2022 1445    CMP     Component Value Date/Time   NA 135 10/02/2022 0340   NA 135 (L) 06/20/2012 2127   K 3.5 10/02/2022 0340   K 4.0 06/20/2012 2127   CL 100 10/02/2022 0340   CL 100 06/20/2012 2127   CO2 26 10/02/2022 0340   CO2 27 06/20/2012 2127   GLUCOSE 120 (H) 10/02/2022 0340   GLUCOSE 250 (H) 06/20/2012 2127   BUN 10 10/02/2022 0340   BUN 20 (H) 06/20/2012 2127   CREATININE 0.51 10/02/2022 0340   CREATININE 0.60 06/20/2012 2127   CALCIUM 9.3 10/02/2022 0340   CALCIUM 8.8 06/20/2012 2127   PROT 6.3 (L) 10/01/2022 1208   PROT 6.7 06/20/2012 2127   ALBUMIN 2.6 (L) 10/01/2022 1208   ALBUMIN 3.5 06/20/2012 2127   AST 16 10/01/2022 1208   AST 17 06/20/2012 2127   ALT 9 10/01/2022 1208   ALT 27 06/20/2012 2127   ALKPHOS 62 10/01/2022 1208   ALKPHOS 127 06/20/2012 2127   BILITOT <0.1 (L) 10/01/2022 1208   BILITOT 0.3 06/20/2012 2127   GFRNONAA >60 10/02/2022 0340   GFRNONAA >60 06/20/2012 2127   GFRAA >60 06/20/2012 2127   Ammonia 17 [id number] TSH 3.025  Lipid Panel     Component Value Date/Time   CHOL 96 08/05/2022 0400   TRIG 531 (H) 08/05/2022 0400   TRIG 535 (H) 08/05/2022 0400   HDL 25 (L) 08/05/2022 0400   CHOLHDL 3.8 08/05/2022 0400   VLDL UNABLE TO CALCULATE IF TRIGLYCERIDE OVER 400 mg/dL 08/05/2022 0400   LDLCALC UNABLE TO CALCULATE IF TRIGLYCERIDE OVER 400 mg/dL 08/05/2022 0400   LDLDIRECT 34 08/05/2022 0400    Toxicology screen-positive for THC  Imaging  CT-head IMPRESSION: 1. No acute intracranial abnormality. 2. Cerebral atrophy and chronic microvascular ischemic changes of the white matter, unchanged.  Assessment: Heather [name] is a 72 y.o. female  with PMHx of cerebellar ICH in December 2992, DM-2, diastolic CHF, COPD, HTN,  HLD, GERD, PAD, AS s/p TAVR, tobacco use disorder, anxiety, depression, and schizophrenia  who presents back to the hospital from SNF after AMS in the context of not talking or taking any of her medications.  On examination, patient is encephalopathic, our service has seen the patient in previous admission and recommended considering an alternative to Cefepime - due to Cefepime toxicity that can present with electrographic disturbances and encephalopathy. .  Not sure if she also has a component of THC overuse-U tox was positive for THC  Impression Toxic metabolic encephalopathy Possible cefepime toxicity Possible THC toxicity    Recommendations: -EEG -Consider alternative antibiotic to cefepime due to concern for cefepime toxicity causing encephalopathy -possibly MRI-will wait to see how she responds to changing of cefepime -Management of any toxic metabolic encephalopathy per primary team as you are -Counsel on avoidance of THC.   -- Coralyn Pear, MD PGY-1 Psychiatry   Attending Neurohospitalist Addendum Patient seen and examined with APP/Resident. Agree with the history and physical as documented above. Agree with the plan as documented, which I helped formulate. I have independently reviewed the chart, obtained history, review of systems and examined the patient.I have personally reviewed pertinent head/neck/spine imaging (CT/MRI). Please feel free to call with any questions.  -- Amie Portland, MD Neurologist [facility] Pager: [phone]

## 2022-10-02 NOTE — TOC Initial Note (Signed)
Transition of Care [facility]) - Initial/Assessment Note    Patient Details  Name: Heather [name] MRN: [id number] Date of Birth: Dec 19, 1950  Transition of Care [facility]) CM/SW Contact:    Bethann Berkshire, [place] Phone Number: 10/02/2022, 1:22 PM  Clinical Narrative:                  Per chart review, pt had a recent admission at [facility] where she was Dc'd to [facility] for short term rehab on 09/30/22. Pt brought in by EMS from [facility] for current admission on 2/7. TOC will follow to assist with return to SNF when stable or any other DC needs.   Expected Discharge Plan: Skilled Nursing Facility Barriers to Discharge: Ship broker, Continued Medical Work up   Patient Goals and CMS Choice            Expected Discharge Plan and Services       Living arrangements for the past 2 months: Single Family Home                                      Prior Living Arrangements/Services Living arrangements for the past 2 months: Single Family Home Lives with:: Self, Spouse Patient language and need for interpreter reviewed:: Yes              Criminal Activity/Legal Involvement Pertinent to Current Situation/Hospitalization: No - Comment as needed  Activities of Daily Living      Permission Sought/Granted                  Emotional Assessment              Admission diagnosis:  Failure to thrive (child) [R62.51] Failure to thrive in adult [R62.7] Altered mental status, unspecified altered mental status type [Z76.73] Acute metabolic encephalopathy [A19.37] Patient Active Problem List   Diagnosis Date Noted   Failure to thrive in adult 90/24/0973   Acute metabolic encephalopathy 53/29/9242   Normocytic anemia 10/01/2022   Acute encephalopathy 09/28/2022   Surgical site infection 09/15/2022   Decreased oral intake 09/13/2022   Chronic diastolic CHF (congestive heart failure) ([place]) 09/13/2022   Dysphagia 09/13/2022   PVD (peripheral vascular disease)  ([place]) 09/11/2022   Right groin wound 09/10/2022   Wound of left groin 09/10/2022   Pulmonary edema 08/16/2022   Abscess of anal and rectal regions 08/15/2022   Decubitus ulcer of sacral region, stage 2 ([place]) 08/15/2022   ICH (intracerebral hemorrhage) ([place]) 08/04/2022   Atherosclerosis of native arteries of extremity with rest pain ([place]) 04/02/2022   PAD (peripheral artery disease) ([place]) 04/11/2021   Ischemic leg pain 04/11/2021   Chronic mesenteric ischemia ([place]) 12/17/2020   Malnutrition of moderate degree 11/12/2020   Asymptomatic bacteriuria 11/10/2020   Abdominal pain 11/09/2020   Hypokalemia 11/09/2020   AKI (acute kidney injury) ([place]) 11/09/2020   MDD (major depressive disorder), recurrent, in full remission ([place]) 01/16/2020   Anxiety disorder 01/16/2020   Hyperlipidemia 12/15/2019   Carotid stenosis 12/15/2019   MDD (major depressive disorder), recurrent, in partial remission ([place]) 08/24/2019   MDD (major depressive disorder), recurrent episode, mild ([place]) 04/08/2019   Insomnia due to mental condition 04/08/2019   Major neurocognitive disorder due to Alzheimer's disease, probable, without behavioral disturbance 04/08/2019   DDD (degenerative disc disease), cervical 12/19/2016   Frequent falls 12/10/2016   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   Nonrheumatic  aortic valve stenosis 09/30/2016   (HFpEF) heart failure with preserved ejection fraction ([place]) 09/20/2016   COPD (chronic obstructive pulmonary disease) ([place]) 09/20/2016   Respiratory failure with hypoxia ([place]) 09/20/2016   NSTEMI (non-ST elevated myocardial infarction) ([place]) 08/21/2016   SOB (shortness of breath) 08/18/2016   Atherosclerosis of native arteries of extremity with intermittent claudication ([place]) 01/08/2016   Palpitations 07/10/2015   History of nonmelanoma skin cancer 03/16/2015   Pseudoaneurysm following procedure ([place]) 07/19/2014   Femoral artery occlusion, right ([place])  06/28/2014   Claudication ([place]) 06/05/2014   Aortic insufficiency 03/20/2014   CAD (coronary artery disease) 03/20/2014   Chronic pain 03/20/2014   Memory loss 11/09/2012   Depressive disorder 08/02/2012   Neck pain 08/02/2012   Malaise and fatigue 08/02/2012   Hypertension, benign 08/02/2012   Dizziness 08/02/2012   Polyneuropathy in diabetes ([place]) 08/02/2012   Rheumatic fever with heart involvement 08/02/2012   Tobacco use disorder 08/02/2012   Diabetes mellitus ([place]) 08/02/1990   PCP:  [name], FNP Pharmacy:  No Pharmacies Listed    Social Determinants of Health (SDOH) Social History: SDOH Screenings   Food Insecurity: No Food Insecurity (09/28/2022)  Recent Concern: Food Insecurity - Food Insecurity Present (08/11/2022)  Housing: Low Risk  (09/28/2022)  Transportation Needs: No Transportation Needs (09/28/2022)  Utilities: Not At Risk (09/28/2022)  Financial Resource Strain: Medium Risk (10/19/2018)  Physical Activity: Inactive (10/19/2018)  Social Connections: Unknown (10/19/2018)  Tobacco Use: High Risk (09/28/2022)   SDOH Interventions:     Readmission Risk Interventions    11/13/2020    2:27 PM  Readmission Risk Prevention Plan  Transportation Screening Complete  PCP or Specialist Appt within 3-5 Days Complete  HRI or [place] Complete  Social Work Consult for [place] Planning/Counseling Complete  Palliative Care Screening Not Applicable  Medication Review Press photographer) Complete

## 2022-10-02 NOTE — Progress Notes (Signed)
LTM EEG hooked up and running - no initial skin breakdown - push button tested - [facility] monitoring.  

## 2022-10-02 NOTE — Progress Notes (Signed)
EEG complete - results pending 

## 2022-10-02 NOTE — Progress Notes (Signed)
Patient's EEG is concerning for GPD's at 2.5 to 3 Hz.  This could be cefepime toxicity but it is also possible that she could be having underlying ongoing nonconvulsive seizures. Would leave her on LTM EEG Would order an Ativan challenge to see if that changes the electrographic appearance. Will follow  -- Amie Portland, MD Neurologist [facility] Pager: [phone]

## 2022-10-02 NOTE — Progress Notes (Signed)
Called portable for replacement air mattress. Per portable, no air mattress available at this time. Requested to get a bed as soon as it gets available.

## 2022-10-02 NOTE — Progress Notes (Signed)
  Progress Note    10/02/2022 11:17 AM * No surgery found *  Subjective: [name] is a 72 year old female who underwent bilateral lower extremity revascularization after embolic event.  She eventually underwent left above-the-knee amputation.  She also underwent debridement of bilateral groins with wound VAC placement.  She was readmitted with encephalopathy.  She is somewhat alert however not oriented.  She is unable to answer questions.   Vitals:   10/02/22 0342 10/02/22 0931  BP: (!) 145/88 (!) 151/63  Pulse: 82 78  Resp:    Temp: 98.6 F (37 C) 97.9 F (36.6 C)  SpO2: 99% 100%   Physical Exam: Lungs:  non labored Incisions: Bilateral groin incisions with healthy wound beds, no drainage, no sign of infection Extremities: Left above-the-knee amputation well-healed; palpable right ATA Neurologic: Somewhat alert but not oriented  CBC    Component Value Date/Time   WBC 6.2 10/02/2022 0340   RBC 3.64 (L) 10/02/2022 0340   HGB 10.6 (L) 10/02/2022 0340   HGB 14.3 06/20/2012 2127   HCT 33.2 (L) 10/02/2022 0340   HCT 42.9 06/20/2012 2127   PLT 442 (H) 10/02/2022 0340   PLT 357 06/20/2012 2127   MCV 91.2 10/02/2022 0340   MCV 91 06/20/2012 2127   MCH 29.1 10/02/2022 0340   MCHC 31.9 10/02/2022 0340   RDW 16.6 (H) 10/02/2022 0340   RDW 13.6 06/20/2012 2127   LYMPHSABS 1.1 10/01/2022 1445   MONOABS 0.7 10/01/2022 1445   EOSABS 0.3 10/01/2022 1445   BASOSABS 0.1 10/01/2022 1445    BMET    Component Value Date/Time   NA 135 10/02/2022 0340   NA 135 (L) 06/20/2012 2127   K 3.5 10/02/2022 0340   K 4.0 06/20/2012 2127   CL 100 10/02/2022 0340   CL 100 06/20/2012 2127   CO2 26 10/02/2022 0340   CO2 27 06/20/2012 2127   GLUCOSE 120 (H) 10/02/2022 0340   GLUCOSE 250 (H) 06/20/2012 2127   BUN 10 10/02/2022 0340   BUN 20 (H) 06/20/2012 2127   CREATININE 0.51 10/02/2022 0340   CREATININE 0.60 06/20/2012 2127   CALCIUM 9.3 10/02/2022 0340   CALCIUM 8.8 06/20/2012  2127   GFRNONAA >60 10/02/2022 0340   GFRNONAA >60 06/20/2012 2127   GFRAA >60 06/20/2012 2127    INR    Component Value Date/Time   INR 1.1 10/01/2022 1445     Intake/Output Summary (Last 24 hours) at 10/02/2022 1117 Last data filed at 10/02/2022 0345 Gross per 24 hour  Intake 1780 ml  Output 700 ml  Net 1080 ml     Assessment/Plan:  72 y.o. female readmitted with encephalopathy.  Previously underwent bilateral lower extremity revascularization surgery, with subsequent left above-knee amputation and bilateral groin debridement with wound VAC placement  Right foot well-perfused with palpable ATA Left AKA well-healed Bilateral groins with healthy appearing wound bed, no sign of infection, no exposure of the artery.  Wound nurse consulted for replacement of wound vacs to the groins which will be changed 3 times a week.  Vascular will follow intermittently.  She is at high risk for further breakdown given her poor nutrition status.   Dagoberto Ligas, PA-C [facility] [phone] 10/02/2022 11:17 AM

## 2022-10-02 NOTE — Progress Notes (Addendum)
      Removed other facilities wound vac on pt's bilateral groin wound. And applied wet to dry dressing. Left upper thigh wound and moisture associated skin damage on perineum are also noted.Air mattress replacement for the pt has been ordered. Wound care has been consulted.

## 2022-10-02 NOTE — Progress Notes (Signed)
Pharmacy Antibiotic Note  Heather [name] is a 72 y.o. female admitted on 10/01/2022 with  wound infection .  Pharmacy has been consulted for zosyn dosing.  Plan: Zosyn 3.375g IV q8h (4 hour infusion).     Temp (24hrs), Avg:98.4 F (36.9 C), Min:97.6 F (36.4 C), Max:100 F (37.8 C)  Recent Labs  Lab 09/27/22 1647 09/27/22 1648 09/28/22 0208 10/01/22 1208 10/01/22 1313 10/01/22 1445 10/02/22 0340  WBC 6.4  --  7.1  --   --  6.6 6.2  CREATININE 0.66  --  0.65 0.67  --   --  0.51  LATICACIDVEN  --  0.9  --  1.0 0.7  --   --     Estimated Creatinine Clearance: 51.9 mL/min (by C-G formula based on SCr of 0.51 mg/dL).    Allergies  Allergen Reactions   Iodinated Contrast Media Hives   Sulfa Antibiotics Hives   Shellfish Allergy Nausea And Vomiting and Rash    Scallops specifically     Antimicrobials this admission: cefepime 2/7 >> 2/8  Flagyl 2/7 >> 2/8 Zosyn 2/8 >>   Thank you for allowing pharmacy to be a part of this patient's care.  Vaughan Basta BS, PharmD, BCPS Clinical Pharmacist 10/02/2022 8:44 AM  Contact: [phone] after 3 PM  "Be curious, not judgmental..." -Jamal Maes

## 2022-10-02 NOTE — Progress Notes (Signed)
[facility] PROGRESS NOTE    Progress Note  Heather [name]  ZTI:[id number] DOB: 03-16-1951 DOA: 10/01/2022 PCP: [name], FNP     Brief Narrative:   Heather [name] is an 72 y.o. female past medical history of diabetes mellitus type 2, chronic diastolic heart failure, COPD, essential hypertension aortic stenosis status post TAVR's tobacco use and schizophrenia comes in due to noncompliance with her medication, she is currently on antibiotics cefepime and Flagyl stop date 10/28/2022 per vascular surgery and ID in the emergency room was found to be afebrile tachypneic, CT of the head showed no acute abnormalities patient was given 2 L of lactate    Assessment/Plan:   Acute metabolic encephalopathy: On the previous admission was thought to be secondary to dehydration. CT of the head showed no acute abnormalities Was restarted back on her medications and started on IV fluids. UDS was positive for cannabis UA shows no infection. TSH, B12 and HIV. Wonder if this is her baseline hide try to call her daughter but has been unsuccessful. Electrolytes are unremarkable, she has no liver failure and acute kidney injury or any other metabolic derangements, she has no infectious source has remained afebrile with no leukocytosis. Her UDS was positive for cannabis. ? Due to cefepime. Will change to zosyn, will discuss with ID.  Right groin wound: Patient infected bilateral groin wound she is currently on IV cefepime and Flagyl with end date of 10/28/2022, she has remained afebrile with no leukocytosis.  Normocytic anemia: Hemoglobin appears to be at baseline.  Essential hypertension: She was restarted back on her Coreg hydralazine amlodipine her blood pressure is stabilized.  Uncontrolled diabetes mellitus type 2: With a last A1c of 13. Restart long-acting insulin plus sliding scale with CBGs every 4.  Sacral decubitus ulcer present on admission: Wound care has been consulted. RN Pressure  Injury Documentation: Pressure Injury 08/04/22 Sacrum Stage 2 -  Partial thickness loss of dermis presenting as a shallow open injury with a red, pink wound bed without slough. (Active)  08/04/22 0100  Location: Sacrum  Location Orientation:   Staging: Stage 2 -  Partial thickness loss of dermis presenting as a shallow open injury with a red, pink wound bed without slough.  Wound Description (Comments):   Present on Admission: Yes  Dressing Type Other (Comment) 09/30/22 0015     Pressure Injury 10/01/22 Thigh Left;Upper Stage 2 -  Partial thickness loss of dermis presenting as a shallow open injury with a red, pink wound bed without slough. (Active)  10/01/22 1900  Location: Thigh  Location Orientation: Left;Upper  Staging: Stage 2 -  Partial thickness loss of dermis presenting as a shallow open injury with a red, pink wound bed without slough.  Wound Description (Comments):   Present on Admission: Yes  Dressing Type Gauze (Comment) 10/02/22 0500    DVT prophylaxis: lovenox Family Communication:none Status is: Observation The patient remains OBS appropriate and will d/c before 2 midnights.    Code Status:     Code Status Orders  (From admission, onward)           Start     Ordered   10/01/22 1915  Full code  Continuous       Question:  By:  Answer:  Consent: discussion documented in EHR   10/01/22 1915           Code Status History     Date Active Date Inactive Code Status Order ID Comments User Context   09/28/2022 0101 09/30/2022  2041 Full Code [id number]  [name], DO ED   08/04/2022 0103 09/22/2022 2128 Full Code [id number]  [name], MD Inpatient   03/25/2022 1112 03/25/2022 1914 Full Code [id number]  [name], MD Inpatient   05/28/2021 1207 05/28/2021 1916 Full Code [id number]  [name], MD Inpatient   04/12/2021 1644 04/13/2021 0235 Full Code [id number]  [name], MD Inpatient   11/09/2020 2351 11/15/2020 1556 Full Code  [id number]  [name], DO ED         IV Access:   Peripheral IV   Procedures and diagnostic studies:   CT Head Wo Contrast  Result Date: 10/01/2022 CLINICAL DATA:  Mental status change, unknown cause EXAM: CT HEAD WITHOUT CONTRAST TECHNIQUE: Contiguous axial images were obtained from the base of the skull through the vertex without intravenous contrast. RADIATION DOSE REDUCTION: This exam was performed according to the departmental dose-optimization program which includes automated exposure control, adjustment of the mA and/or kV according to patient size and/or use of iterative reconstruction technique. COMPARISON:  CT examination dated September 27, 2022 FINDINGS: Brain: No evidence of acute infarction, hemorrhage, hydrocephalus, extra-axial collection or mass lesion/mass effect. Cerebral atrophy and chronic microvascular ischemic changes of the white matter, unchanged. Vascular: No hyperdense vessel or unexpected calcification. Skull: Normal. Negative for fracture or focal lesion. Sinuses/Orbits: No acute finding. Other: None. IMPRESSION: 1. No acute intracranial abnormality. 2. Cerebral atrophy and chronic microvascular ischemic changes of the white matter, unchanged. Electronically Signed   By: Keane Police D.O.   On: 10/01/2022 12:50   DG Chest Port 1 View  Result Date: 10/01/2022 CLINICAL DATA:  Sepsis. EXAM: PORTABLE CHEST 1 VIEW COMPARISON:  09/27/2022 FINDINGS: The cardiac silhouette, mediastinal and hilar contours are normal. Stable aortic calcifications. 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 Left coronary artery stent is noted. Left PICC line tip is in good position in the distal SVC. The lungs are clear of an acute process. No infiltrates or effusions. The bony thorax is intact. IMPRESSION: No acute cardiopulmonary findings. Electronically Signed   By: Marijo Sanes M.D.   On: 10/01/2022 12:34     Medical Consultants:   None.   Subjective:    Heather [name] no  complaints  Objective:    Vitals:   10/01/22 1725 10/01/22 2044 10/01/22 2344 10/02/22 0342  BP: (!) 156/55 (!) 135/102 (!) 148/79 (!) 145/88  Pulse: 81 84 81 82  Resp: 18     Temp: 97.6 F (36.4 C) 98.5 F (36.9 C) 97.6 F (36.4 C) 98.6 F (37 C)  TempSrc: Oral Oral Oral   SpO2: 100% 98% 96% 99%   SpO2: 99 %   Intake/Output Summary (Last 24 hours) at 10/02/2022 0710 Last data filed at 10/02/2022 0345 Gross per 24 hour  Intake 1780 ml  Output 700 ml  Net 1080 ml   There were no vitals filed for this visit.  Exam: General exam: In no acute distress. Respiratory system: Good air movement and clear to auscultation. Cardiovascular system: S1 & S2 heard, RRR. No JVD. Gastrointestinal system: Abdomen is nondistended, soft and nontender.  Central nervous system: She is awake alert and oriented x 4 no gross focal deficits Extremities: No pedal edema. Skin: No rashes, lesions or ulcers Psychiatry: No judgment or insight of medical condition   Data Reviewed:    Labs: Basic Metabolic Panel: Recent Labs  Lab 09/27/22 1647 09/28/22 0208 10/01/22 1208 10/02/22 0340  NA 135 135 134* 135  K 3.9 3.5  3.8 3.5  CL 103 103 100 100  CO2 '23 23 24 26  '$ GLUCOSE 246* 178* 177* 120*  BUN '9 9 14 10  '$ CREATININE 0.66 0.65 0.67 0.51  CALCIUM 9.1 9.2 9.3 9.3   GFR Estimated Creatinine Clearance: 51.9 mL/min (by C-G formula based on SCr of 0.51 mg/dL). Liver Function Tests: Recent Labs  Lab 09/27/22 1647 09/28/22 0208 10/01/22 1208  AST '19 16 16  '$ ALT '8 8 9  '$ ALKPHOS 67 61 62  BILITOT 0.4 0.4 <0.1*  PROT 6.1* 6.1* 6.3*  ALBUMIN 2.4* 2.5* 2.6*   No results for input(s): "LIPASE", "AMYLASE" in the last 168 hours. Recent Labs  Lab 09/27/22 1648 10/01/22 1208  AMMONIA 15 17   Coagulation profile Recent Labs  Lab 10/01/22 1313 10/01/22 1445  INR 1.0 1.1   COVID-19 Labs  No results for input(s): "DDIMER", "FERRITIN", "LDH", "CRP" in the last 72 hours.  Lab Results   Component Value Date   SARSCOV2NAA POSITIVE (A) 09/18/2022   SARSCOV2NAA NEGATIVE 08/03/2022   [place] NEGATIVE 11/10/2020    CBC: Recent Labs  Lab 09/27/22 1647 09/28/22 0208 10/01/22 1445 10/02/22 0340  WBC 6.4 7.1 6.6 6.2  NEUTROABS 4.0  --  4.4  --   HGB 10.7* 10.7* 11.2* 10.6*  HCT 32.5* 33.4* 33.5* 33.2*  MCV 95.3 94.6 92.5 91.2  PLT 503* 483* 434* 442*   Cardiac Enzymes: No results for input(s): "CKTOTAL", "CKMB", "CKMBINDEX", "TROPONINI" in the last 168 hours. BNP (last 3 results) No results for input(s): "PROBNP" in the last 8760 hours. CBG: Recent Labs  Lab 09/29/22 2203 09/30/22 0823 09/30/22 1235 10/01/22 1315 10/01/22 2148  GLUCAP 108* 176* 106* 154* 108*   D-Dimer: No results for input(s): "DDIMER" in the last 72 hours. Hgb A1c: No results for input(s): "HGBA1C" in the last 72 hours. Lipid Profile: No results for input(s): "CHOL", "HDL", "LDLCALC", "TRIG", "CHOLHDL", "LDLDIRECT" in the last 72 hours. Thyroid function studies: No results for input(s): "TSH", "T4TOTAL", "T3FREE", "THYROIDAB" in the last 72 hours.  Invalid input(s): "FREET3" Anemia work up: No results for input(s): "VITAMINB12", "FOLATE", "FERRITIN", "TIBC", "IRON", "RETICCTPCT" in the last 72 hours. Sepsis Labs: Recent Labs  Lab 09/27/22 1647 09/27/22 1648 09/28/22 0208 10/01/22 1208 10/01/22 1313 10/01/22 1445 10/02/22 0340  WBC 6.4  --  7.1  --   --  6.6 6.2  LATICACIDVEN  --  0.9  --  1.0 0.7  --   --    Microbiology Recent Results (from the past 240 hour(s))  MRSA Next Gen by PCR, Nasal     Status: None   Collection Time: 09/28/22  5:18 AM   Specimen: Wound; Nasal Swab  Result Value Ref Range Status   MRSA by PCR Next Gen NOT DETECTED NOT DETECTED Final    Comment: (NOTE) The GeneXpert MRSA Assay (FDA approved for NASAL specimens only), is one component of a comprehensive MRSA colonization surveillance program. It is not intended to diagnose MRSA infection nor  to guide or monitor treatment for MRSA infections. Test performance is not FDA approved in patients less than 18 years old. Performed at [facility], [place] [address] [place], [place] [address]      Medications:    amLODipine  10 mg Oral Daily   atorvastatin  80 mg Oral QHS   carvedilol  6.25 mg Oral BID WC   Chlorhexidine Gluconate Cloth  6 each Topical Daily   clopidogrel  75 mg Oral Daily   enoxaparin (LOVENOX)  injection  40 mg Subcutaneous Q24H   escitalopram  10 mg Oral Daily   hydrALAZINE  50 mg Oral Q8H   insulin aspart  0-15 Units Subcutaneous TID WC   insulin aspart  3 Units Subcutaneous TID WC   insulin glargine-yfgn  5 Units Subcutaneous BID   mirtazapine  7.5 mg Oral QHS   sodium chloride flush  3 mL Intravenous Q12H   Continuous Infusions:  ceFEPime (MAXIPIME) IV 2 g (10/01/22 1959)   lactated ringers 100 mL/hr at 10/02/22 0658   metronidazole Stopped (10/02/22 0045)      LOS: 0 days   Heather [name]  [facility]  10/02/2022, 7:10 AM

## 2022-10-02 NOTE — Procedures (Signed)
Patient Name: Heather [name]  MRN: [id number]  Epilepsy Attending: Lora Havens  Referring Physician/Provider: Amie Portland, MD  Date: 10/02/2022 Duration: 27.03 mins  Patient history: 72yo F with ams. EEG to evaluate for seizure  Level of alertness: Awake  AEDs during EEG study: None  Technical aspects: This EEG study was done with scalp electrodes positioned according to the 10-20 International system of electrode placement. Electrical activity was reviewed with band pass filter of 1-'70Hz'$ , sensitivity of 7 uV/mm, display speed of 20m/sec with a '60Hz'$  notched filter applied as appropriate. EEG data were recorded continuously and digitally stored.  Video monitoring was available and reviewed as appropriate.  Description: EEG showed continuous generalized 3 to 6 Hz theta-delta slowing. Generalized periodic discharges with triphasic morphology were noted with fluctuating frequency of 1.5 to '3hz'$  Hz. Hyperventilation and photic stimulation were not performed.     ABNORMALITY - Periodic discharges with triphasic morphology, generalized ( GPDs) - Continuous slow, generalized  IMPRESSION: This study showed generalized periodic discharges with triphasic morphology with fluctuating frequency of 1.5 to '3hz'$  Hz. This eeg pattern is on the ictal-interictal continuum with high suspicion for ictal nature due to frequency of '3hz'$ . Recommend ativan challenga and long term monitoring.  Additionally there is moderate to severe diffuse encephalopathy, nonspecific etiology   Dr. ARory Percywas notified.   Chadley Dziedzic OBarbra Sarks

## 2022-10-02 NOTE — Consult Note (Signed)
WOC Nurse Consult Note: Reason for Consult: bilateral groin wounds postop bilateral groin debridement on prior admission; L ischium Pressure Injury  Wound type: surgical wounds; Pressure Injury  Pressure Injury POA: Yes Measurement:  R groin postop full thickness 4 cms x 6 cms x 2.5 cms 90% red 10% yellow fibrinous tissue  L groin postop full thickness 7 cms x 5 cms x 2.3 cms 80% red 20% yellow fibrinous tissue  L ischium Stage 2 Pressure Injury 1 cm x 2 cms x 0.1 cm  50% red moist 50% yellow fibrinous tissue  Drainage (amount, consistency, odor) bilateral groins minimal serosanguinous Periwound: intact  Dressing procedure/placement/frequency: Removed saline wet to dry dressings, cleansed wounds with normal saline Skin barrier rings utilized around groin wounds to help secure seal.  R groin wound filled 1 piece of white foam, 1 piece of black foam; L groin wound filled 1 piece of white foam, 1 piece of black foam   These wounds are connected with Y connector and sealed NPWT dressing at 182m HG  Patient tolerated procedure well.    L ischium:  Cleanse with NS, cut antimicrobial silver hydrofiber (Aquacel ALeward Quan#[id number] to fit and place in wound bed daily, cover with foam dressing.  Change foam dressing q3 days and prn soiling.    [place] will continue to provide NPWT dressing change due to the complexity of the dressing change. Plan for next NPWT dressing change Monday 10/06/2022.    Thank you,    Tishara Pizano MSN, RN-BC, [employer]

## 2022-10-03 DIAGNOSIS — L89152 Pressure ulcer of sacral region, stage 2: Secondary | ICD-10-CM | POA: Diagnosis not present

## 2022-10-03 DIAGNOSIS — R4182 Altered mental status, unspecified: Secondary | ICD-10-CM | POA: Diagnosis not present

## 2022-10-03 DIAGNOSIS — I10 Essential (primary) hypertension: Secondary | ICD-10-CM | POA: Diagnosis not present

## 2022-10-03 DIAGNOSIS — G9341 Metabolic encephalopathy: Secondary | ICD-10-CM | POA: Diagnosis not present

## 2022-10-03 LAB — GLUCOSE, CAPILLARY
Glucose-Capillary: 165 mg/dL — ABNORMAL HIGH (ref 70–99)
Glucose-Capillary: 183 mg/dL — ABNORMAL HIGH (ref 70–99)
Glucose-Capillary: 244 mg/dL — ABNORMAL HIGH (ref 70–99)
Glucose-Capillary: 242 mg/dL — ABNORMAL HIGH (ref 70–99)

## 2022-10-03 LAB — VITAMIN B1: Vitamin B1 (Thiamine): 159.2 nmol/L (ref 66.5–200.0)

## 2022-10-03 MED ORDER — LEVETIRACETAM 500 MG PO TABS
500.0000 mg | ORAL_TABLET | Freq: Two times a day (BID) | ORAL | Status: DC
  Administered 2022-10-03 – 2022-10-06 (×6): 500 mg via ORAL
  Filled 2022-10-03 (×6): qty 1

## 2022-10-03 MED ORDER — LEVETIRACETAM IN NACL 1000 MG/100ML IV SOLN
1000.0000 mg | INTRAVENOUS | Status: AC
  Administered 2022-10-03: 1000 mg via INTRAVENOUS
  Filled 2022-10-03: qty 100

## 2022-10-03 NOTE — Progress Notes (Addendum)
Neurology Progress Note   S:// Patient was seen this morning sitting in her bed, attached to the EEG, and trying to use her phone. She reports feeling better.    O:// Current vital signs: BP (!) 183/84 (BP Location: Right Arm)   Pulse 92   Temp 98.3 F (36.8 C) (Oral)   Resp 16   SpO2 98%  Vital signs in last 24 hours: Temp:  [97.6 F (36.4 C)-99.7 F (37.6 C)] 98.3 F (36.8 C) (02/09 0742) Pulse Rate:  [89-99] 92 (02/09 0742) Resp:  [16-18] 16 (02/09 0742) BP: (110-183)/(69-84) 183/84 (02/09 0742) SpO2:  [96 %-98 %] 98 % (02/09 0742) GENERAL: Awake, alert in NAD HEENT: - Normocephalic and atraumatic, dry mm, no LN++, no Thyromegally LUNGS - Clear to auscultation bilaterally with no wheezes CV - S1S2 RRR, no m/r/g, equal pulses bilaterally. ABDOMEN - Soft, nontender, nondistended with normoactive BS Ext: warm, well perfused, intact peripheral pulses, no edema NEURO:  Mental Status: AA&Ox2.  Attention concentration is reduced. Language: speech is somewhat dysarthric.  Naming, repetition, fluency, and comprehension intact. Cranial Nerves: PERRL. EOMI, visual fields full, no facial asymmetry, facial sensation intact, hearing intact, tongue/uvula/soft palate midline, normal sternocleidomastoid and trapezius muscle strength. No evidence of tongue atrophy or fibrillations Motor: 5/5 strength in upper extremities, left AKA, 4/5 strength RLE.  She has asterixis on outstretched arms Tone: is normal and bulk is normal Sensation- Intact to light touch bilaterally Coordination: FTN intact bilaterally, asterixis present  Gait- deferred  NIHSS 0  Medications  Current Facility-Administered Medications:    acetaminophen (TYLENOL) tablet 650 mg, 650 mg, Oral, Q6H PRN, 650 mg at 10/03/22 0754 **OR** acetaminophen (TYLENOL) suppository 650 mg, 650 mg, Rectal, Q6H PRN, Tamala Julian, Rondell A, MD   albuterol (PROVENTIL) (2.5 MG/3ML) 0.083% nebulizer solution 2.5 mg, 2.5 mg, Nebulization, Q6H PRN,  Smith, Rondell A, MD   amLODipine (NORVASC) tablet 10 mg, 10 mg, Oral, Daily, Smith, Rondell A, MD, 10 mg at 10/03/22 [id number]   atorvastatin (LIPITOR) tablet 80 mg, 80 mg, Oral, QHS, Smith, Rondell A, MD, 80 mg at 10/02/22 2119   carvedilol (COREG) tablet 6.25 mg, 6.25 mg, Oral, BID WC, Smith, Rondell A, MD, 6.25 mg at 10/03/22 0754   Chlorhexidine Gluconate Cloth 2 % PADS 6 each, 6 each, Topical, Daily, Fuller Plan A, MD, 6 each at 10/03/22 [id number]   clopidogrel (PLAVIX) tablet 75 mg, 75 mg, Oral, Daily, Tamala Julian, Rondell A, MD, 75 mg at 10/03/22 [id number]   docusate sodium (COLACE) capsule 100 mg, 100 mg, Oral, Daily, Aileen Fass, Tammi Klippel, MD, 100 mg at 10/03/22 [id number]   dronabinol (MARINOL) capsule 2.5 mg, 2.5 mg, Oral, BID AC, Charlynne Cousins, MD, 2.5 mg at 10/02/22 1615   enoxaparin (LOVENOX) injection 40 mg, 40 mg, Subcutaneous, Q24H, Smith, Rondell A, MD, 40 mg at 10/02/22 2119   escitalopram (LEXAPRO) tablet 10 mg, 10 mg, Oral, Daily, Smith, Rondell A, MD, 10 mg at 10/03/22 0831   hydrALAZINE (APRESOLINE) tablet 50 mg, 50 mg, Oral, Q8H, Smith, Rondell A, MD, 50 mg at 10/03/22 0754   insulin aspart (novoLOG) injection 0-15 Units, 0-15 Units, Subcutaneous, TID WC, Smith, Rondell A, MD, 3 Units at 10/03/22 0830   insulin aspart (novoLOG) injection 3 Units, 3 Units, Subcutaneous, TID WC, Smith, Rondell A, MD, 3 Units at 10/03/22 0830   insulin glargine-yfgn (SEMGLEE) injection 5 Units, 5 Units, Subcutaneous, BID, Fuller Plan A, MD, 5 Units at 10/03/22 0846   lactated ringers infusion, , Intravenous, Continuous, Aileen Fass,  Tammi Klippel, MD, Last Rate: 10 mL/hr at 10/02/22 0941, Rate Change at 10/02/22 0941   mirtazapine (REMERON) tablet 7.5 mg, 7.5 mg, Oral, QHS, Smith, Rondell A, MD, 7.5 mg at 10/02/22 2119   pantoprazole (PROTONIX) EC tablet 40 mg, 40 mg, Oral, Daily, Charlynne Cousins, MD, 40 mg at 10/03/22 0832   piperacillin-tazobactam (ZOSYN) IVPB 3.375 g, 3.375 g, Intravenous, Q8H, Reome, Earle  J, RPH, Last Rate: 12.5 mL/hr at 10/03/22 0530, 3.375 g at 10/03/22 0530   sodium chloride flush (NS) 0.9 % injection 10-40 mL, 10-40 mL, Intracatheter, PRN, Tamala Julian, Rondell A, MD   sodium chloride flush (NS) 0.9 % injection 3 mL, 3 mL, Intravenous, Q12H, Smith, Rondell A, MD, 3 mL at 10/02/22 2125   traZODone (DESYREL) tablet 25 mg, 25 mg, Oral, QHS PRN, Norval Morton, MD Labs CBC    Component Value Date/Time   WBC 6.2 10/02/2022 0340   RBC 3.64 (L) 10/02/2022 0340   HGB 10.6 (L) 10/02/2022 0340   HGB 14.3 06/20/2012 2127   HCT 33.2 (L) 10/02/2022 0340   HCT 42.9 06/20/2012 2127   PLT 442 (H) 10/02/2022 0340   PLT 357 06/20/2012 2127   MCV 91.2 10/02/2022 0340   MCV 91 06/20/2012 2127   MCH 29.1 10/02/2022 0340   MCHC 31.9 10/02/2022 0340   RDW 16.6 (H) 10/02/2022 0340   RDW 13.6 06/20/2012 2127   LYMPHSABS 1.1 10/01/2022 1445   MONOABS 0.7 10/01/2022 1445   EOSABS 0.3 10/01/2022 1445   BASOSABS 0.1 10/01/2022 1445    CMP     Component Value Date/Time   NA 135 10/02/2022 0340   NA 135 (L) 06/20/2012 2127   K 3.5 10/02/2022 0340   K 4.0 06/20/2012 2127   CL 100 10/02/2022 0340   CL 100 06/20/2012 2127   CO2 26 10/02/2022 0340   CO2 27 06/20/2012 2127   GLUCOSE 120 (H) 10/02/2022 0340   GLUCOSE 250 (H) 06/20/2012 2127   BUN 10 10/02/2022 0340   BUN 20 (H) 06/20/2012 2127   CREATININE 0.51 10/02/2022 0340   CREATININE 0.60 06/20/2012 2127   CALCIUM 9.3 10/02/2022 0340   CALCIUM 8.8 06/20/2012 2127   PROT 6.3 (L) 10/01/2022 1208   PROT 6.7 06/20/2012 2127   ALBUMIN 2.6 (L) 10/01/2022 1208   ALBUMIN 3.5 06/20/2012 2127   AST 16 10/01/2022 1208   AST 17 06/20/2012 2127   ALT 9 10/01/2022 1208   ALT 27 06/20/2012 2127   ALKPHOS 62 10/01/2022 1208   ALKPHOS 127 06/20/2012 2127   BILITOT <0.1 (L) 10/01/2022 1208   BILITOT 0.3 06/20/2012 2127   GFRNONAA >60 10/02/2022 0340   GFRNONAA >60 06/20/2012 2127   GFRAA >60 06/20/2012 2127    glycosylated  hemoglobin  Lipid Panel     Component Value Date/Time   CHOL 96 08/05/2022 0400   TRIG 531 (H) 08/05/2022 0400   TRIG 535 (H) 08/05/2022 0400   HDL 25 (L) 08/05/2022 0400   CHOLHDL 3.8 08/05/2022 0400   VLDL UNABLE TO CALCULATE IF TRIGLYCERIDE OVER 400 mg/dL 08/05/2022 0400   LDLCALC UNABLE TO CALCULATE IF TRIGLYCERIDE OVER 400 mg/dL 08/05/2022 0400   LDLDIRECT 34 08/05/2022 0400     Imaging  EEG 2/9 This study initially showed generalized non-convulsive status epilepticus. IV ativan 108m was administered at [place] 2[place]8/202 after which status epilepticus resolved. .  Subsequently EEG was suggestive of moderate diffuse encephalopathy, nonspecific etiology.   CT-head IMPRESSION: 1. No acute intracranial abnormality. 2.  Cerebral atrophy and chronic microvascular ischemic changes of the white matter, unchanged.   Assessment: Oceane Strehle is a 72 y.o. 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   Patient's EEG showed  generalized non-convulsive status epilepticus. IV ativan 86m was administered at 1[place] 2/8/202 after which status epilepticus resolved. This could be cefepime toxicity but it is also possible that she could be having underlying ongoing nonconvulsive seizures.   Patient was changed from cefepime to pipercillin/tazobactam.   Impression Nonconvulsive status epilepticus versus cefepime toxicity-seems resolved  Recommendations: -Continue LTM EEG for 1 more day -Due to the fact that her EEG pattern which could have been nonconvulsive status epilepticus versus cefepime toxicity seem to improve with Ativan, there is more suspicion for this being an electrographic abnormality like seizure and as such I would start her on  antiepileptic for now.  Loaded with Keppra 1 g IV x 1 followed by Keppra 500 twice daily. -Counsel on avoidance of THC.   DCoralyn Pear MD PGY-1 Psychiatry   Attending Neurohospitalist Addendum Patient seen and examined with APP/Resident. Agree with the history and physical as documented above. Agree with the plan as documented, which I helped formulate. I have independently reviewed the chart, obtained history, review of systems and examined the patient.I have personally reviewed pertinent head/neck/spine imaging (CT/MRI). Please feel free to call with any questions.  -- AAmie [place] MD Neurologist [facility] Pager: [phone]

## 2022-10-03 NOTE — Progress Notes (Signed)
[facility] PROGRESS NOTE    Progress Note  Heather [name]  [id number] DOB: 27-Apr-1951 DOA: 10/01/2022 PCP: [name], FNP     Brief Narrative:   Heather [name] is an 72 y.o. female past medical history of diabetes mellitus type 2, chronic diastolic heart failure, COPD, essential hypertension aortic stenosis status post TAVR's tobacco use and schizophrenia comes in due to noncompliance with her medication, she is currently on antibiotics cefepime and Flagyl stop date 10/28/2022 per vascular surgery and ID in the emergency room was found to be afebrile tachypneic, CT of the head showed no acute abnormalities patient was given 2 L of lactate.  Assessment/Plan:   Acute metabolic encephalopathy: Neurology was consulted recommended change antibiotics she is now on Zosyn. There are no signs of infection. EEG was done that showed possible electroencephalographic concerns. She is currently on Ativan challenge. Mentation is improved able to answer yes/no questions correctly.  Right groin wound: Patient infected bilateral groin wound she is currently on IV cefepime and Flagyl with end date of 10/28/2022, she has remained afebrile with no leukocytosis.  Normocytic anemia: Hemoglobin appears to be at baseline.  Essential hypertension: She was restarted back on her Coreg hydralazine amlodipine her blood pressure is stabilized.  Uncontrolled diabetes mellitus type 2: With a last A1c of 13. Restart long-acting insulin plus sliding scale with CBGs every 4.  Sacral decubitus ulcer present on admission: Wound care has been consulted. RN Pressure Injury Documentation: Pressure Injury 08/04/22 Sacrum Stage 2 -  Partial thickness loss of dermis presenting as a shallow open injury with a red, pink wound bed without slough. (Active)  08/04/22 0100  Location: Sacrum  Location Orientation:   Staging: Stage 2 -  Partial thickness loss of dermis presenting as a shallow open injury with a red,  pink wound bed without slough.  Wound Description (Comments):   Present on Admission: Yes  Dressing Type Other (Comment) 09/30/22 0015     Pressure Injury 10/02/22 Ischial tuberosity Left Stage 2 -  Partial thickness loss of dermis presenting as a shallow open injury with a red, pink wound bed without slough. 1 cm x 2 cm x 0.1 cm (Active)  10/02/22 0800  Location: Ischial tuberosity  Location Orientation: Left  Staging: Stage 2 -  Partial thickness loss of dermis presenting as a shallow open injury with a red, pink wound bed without slough.  Wound Description (Comments): 1 cm x 2 cm x 0.1 cm  Present on Admission: Yes    DVT prophylaxis: lovenox Family Communication:none Status is: Observation The patient remains OBS appropriate and will d/c before 2 midnights.    Code Status:     Code Status Orders  (From admission, onward)           Start     Ordered   10/01/22 1915  Full code  Continuous       Question:  By:  Answer:  Consent: discussion documented in EHR   10/01/22 1915           Code Status History     Date Active Date Inactive Code Status Order ID Comments User Context   09/28/2022 0101 09/30/2022 2041 Full Code [id number]  [name], DO ED   08/04/2022 0103 09/22/2022 2128 Full Code [id number]  [name], MD Inpatient   03/25/2022 1112 03/25/2022 1914 Full Code [id number]  [name], MD Inpatient   05/28/2021 1207 05/28/2021 1916 Full Code [id number]  Schnier, Dolores Lory, MD Inpatient   04/12/2021  1644 04/13/2021 0235 Full Code [id number]  [name], MD Inpatient   11/09/2020 2351 11/15/2020 1556 Full Code [id number]  [name], DO ED         IV Access:   Peripheral IV   Procedures and diagnostic studies:   EEG adult  Result Date: 10/02/2022 Lora Havens, MD     10/02/2022  7:50 PM Patient Name: Heather [name] MRN: [id number] Epilepsy Attending: Lora Havens Referring Physician/Provider: Amie [place], MD Date:  10/02/2022 Duration: 27.03 mins Patient history: 72yo F with ams. EEG to evaluate for seizure Level of alertness: Awake AEDs during EEG study: None Technical aspects: This EEG study was done with scalp electrodes positioned according to the 10-20 International system of electrode placement. Electrical activity was reviewed with band pass filter of 1-70Hz$ , sensitivity of 7 uV/mm, display speed of 23m/sec with a 60Hz$  notched filter applied as appropriate. EEG data were recorded continuously and digitally stored.  Video monitoring was available and reviewed as appropriate. Description: EEG showed continuous generalized 3 to 6 Hz theta-delta slowing. Generalized periodic discharges with triphasic morphology were noted with fluctuating frequency of 1.5 to 3hz$  Hz. Hyperventilation and photic stimulation were not performed.   ABNORMALITY - Periodic discharges with triphasic morphology, generalized ( GPDs) - Continuous slow, generalized IMPRESSION: This study showed generalized periodic discharges with triphasic morphology with fluctuating frequency of 1.5 to 3hz$  Hz. This eeg pattern is on the ictal-interictal continuum with high suspicion for ictal nature due to frequency of 3hz$ . Recommend ativan challenga and long term monitoring. Additionally there is moderate to severe diffuse encephalopathy, nonspecific etiology Dr. ARory Percywas notified. PLora Havens  CT Head Wo Contrast  Result Date: 10/01/2022 CLINICAL DATA:  Mental status change, unknown cause EXAM: CT HEAD WITHOUT CONTRAST TECHNIQUE: Contiguous axial images were obtained from the base of the skull through the vertex without intravenous contrast. RADIATION DOSE REDUCTION: This exam was performed according to the departmental dose-optimization program which includes automated exposure control, adjustment of the mA and/or kV according to patient size and/or use of iterative reconstruction technique. COMPARISON:  CT examination dated September 27, 2022 FINDINGS:  Brain: No evidence of acute infarction, hemorrhage, hydrocephalus, extra-axial collection or mass lesion/mass effect. Cerebral atrophy and chronic microvascular ischemic changes of the white matter, unchanged. Vascular: No hyperdense vessel or unexpected calcification. Skull: Normal. Negative for fracture or focal lesion. Sinuses/Orbits: No acute finding. Other: None. IMPRESSION: 1. No acute intracranial abnormality. 2. Cerebral atrophy and chronic microvascular ischemic changes of the white matter, unchanged. Electronically Signed   By: IKeane PoliceD.O.   On: 10/01/2022 12:50   DG Chest Port 1 View  Result Date: 10/01/2022 CLINICAL DATA:  Sepsis. EXAM: PORTABLE CHEST 1 VIEW COMPARISON:  09/27/2022 FINDINGS: The cardiac silhouette, mediastinal and hilar contours are normal. Stable aortic calcifications. 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 Left coronary artery stent is noted. Left PICC line tip is in good position in the distal SVC. The lungs are clear of an acute process. No infiltrates or effusions. The bony thorax is intact. IMPRESSION: No acute cardiopulmonary findings. Electronically Signed   By: PMarijo SanesM.D.   On: 10/01/2022 12:34     Medical Consultants:   None.   Subjective:    Heather Grollermore awake today  Objective:    Vitals:   10/02/22 1433 10/02/22 2026 10/03/22 0300 10/03/22 0742  BP: 124/69 110/75 (!) 144/80 (!) 183/84  Pulse: 99 89 92 92  Resp: 16 18 18 $ 16  Temp: 97.6 F (36.4 C) 98 F (36.7 C) 99.7 F (37.6 C) 98.3 F (36.8 C)  TempSrc: Oral Oral Oral Oral  SpO2: 96% 98% 98% 98%   SpO2: 98 %   Intake/Output Summary (Last 24 hours) at 10/03/2022 0840 Last data filed at 10/03/2022 0432 Gross per 24 hour  Intake 110 ml  Output 1550 ml  Net -1440 ml    There were no vitals filed for this visit.  Exam: General exam: In no acute distress. Respiratory system: Good air movement and clear to auscultation. Cardiovascular system: S1 & S2 heard, RRR. No  JVD. Gastrointestinal system: Abdomen is nondistended, soft and nontender.  Central nervous system: She is awake alert and oriented x 4 no gross focal deficits Extremities: No pedal edema. Skin: No rashes, lesions or ulcers Psychiatry: Judgment in  medical condition is improved.   Data Reviewed:    Labs: Basic Metabolic Panel: Recent Labs  Lab 09/27/22 1647 09/28/22 0208 10/01/22 1208 10/02/22 0340  NA 135 135 134* 135  K 3.9 3.5 3.8 3.5  CL 103 103 100 100  CO2 23 23 24 26  $ GLUCOSE 246* 178* 177* 120*  BUN 9 9 14 10  $ CREATININE 0.66 0.65 0.67 0.51  CALCIUM 9.1 9.2 9.3 9.3    GFR Estimated Creatinine Clearance: 51.9 mL/min (by C-G formula based on SCr of 0.51 mg/dL). Liver Function Tests: Recent Labs  Lab 09/27/22 1647 09/28/22 0208 10/01/22 1208  AST 19 16 16  $ ALT 8 8 9  $ ALKPHOS 67 61 62  BILITOT 0.4 0.4 <0.1*  PROT 6.1* 6.1* 6.3*  ALBUMIN 2.4* 2.5* 2.6*    No results for input(s): "LIPASE", "AMYLASE" in the last 168 hours. Recent Labs  Lab 09/27/22 1648 10/01/22 1208  AMMONIA 15 17    Coagulation profile Recent Labs  Lab 10/01/22 1313 10/01/22 1445  INR 1.0 1.1    COVID-19 Labs  No results for input(s): "DDIMER", "FERRITIN", "LDH", "CRP" in the last 72 hours.  Lab Results  Component Value Date   SARSCOV2NAA POSITIVE (A) 09/18/2022   SARSCOV2NAA NEGATIVE 08/03/2022   [place] NEGATIVE 11/10/2020    CBC: Recent Labs  Lab 09/27/22 1647 09/28/22 0208 10/01/22 1445 10/02/22 0340  WBC 6.4 7.1 6.6 6.2  NEUTROABS 4.0  --  4.4  --   HGB 10.7* 10.7* 11.2* 10.6*  HCT 32.5* 33.4* 33.5* 33.2*  MCV 95.3 94.6 92.5 91.2  PLT 503* 483* 434* 442*    Cardiac Enzymes: No results for input(s): "CKTOTAL", "CKMB", "CKMBINDEX", "TROPONINI" in the last 168 hours. BNP (last 3 results) No results for input(s): "PROBNP" in the last 8760 hours. CBG: Recent Labs  Lab 10/01/22 2148 10/02/22 0931 10/02/22 1142 10/02/22 1814 10/03/22 0754  GLUCAP  108* 162* 170* 197* 165*    D-Dimer: No results for input(s): "DDIMER" in the last 72 hours. Hgb A1c: No results for input(s): "HGBA1C" in the last 72 hours. Lipid Profile: No results for input(s): "CHOL", "HDL", "LDLCALC", "TRIG", "CHOLHDL", "LDLDIRECT" in the last 72 hours. Thyroid function studies: Recent Labs    10/02/22 0500  TSH 3.025   Anemia work up: Recent Labs    10/02/22 0723  VITAMINB12 526   Sepsis Labs: Recent Labs  Lab 09/27/22 1647 09/27/22 1648 09/28/22 0208 10/01/22 1208 10/01/22 1313 10/01/22 1445 10/02/22 0340  WBC 6.4  --  7.1  --   --  6.6 6.2  LATICACIDVEN  --  0.9  --  1.0 0.7  --   --  Microbiology Recent Results (from the past 240 hour(s))  MRSA Next Gen by PCR, Nasal     Status: None   Collection Time: 09/28/22  5:18 AM   Specimen: Wound; Nasal Swab  Result Value Ref Range Status   MRSA by PCR Next Gen NOT DETECTED NOT DETECTED Final    Comment: (NOTE) The GeneXpert MRSA Assay (FDA approved for NASAL specimens only), is one component of a comprehensive MRSA colonization surveillance program. It is not intended to diagnose MRSA infection nor to guide or monitor treatment for MRSA infections. Test performance is not FDA approved in patients less than 59 years old. Performed at [facility], [place] [address] [place], [place] [address]   Blood Culture (routine x 2)     Status: None (Preliminary result)   Collection Time: 10/01/22  1:00 PM   Specimen: BLOOD  Result Value Ref Range Status   Specimen Description BLOOD RIGHT ANTECUBITAL  Final   Special Requests   Final    BOTTLES DRAWN AEROBIC AND ANAEROBIC Blood Culture results may not be optimal due to an excessive volume of blood received in culture bottles   Culture   Final    NO GROWTH < 24 HOURS Performed at [facility], [place] [address] [place], [place] [address]    Report Status PENDING  Incomplete  Blood Culture (routine x 2)     Status: None (Preliminary  result)   Collection Time: 10/01/22  1:13 PM   Specimen: BLOOD LEFT FOREARM  Result Value Ref Range Status   Specimen Description BLOOD LEFT FOREARM  Final   Special Requests   Final    BOTTLES DRAWN AEROBIC AND ANAEROBIC Blood Culture adequate volume   Culture   Final    NO GROWTH < 24 HOURS Performed at [facility], [place] [address] [place], [place] [address]    Report Status PENDING  Incomplete     Medications:    amLODipine  10 mg Oral Daily   atorvastatin  80 mg Oral QHS   carvedilol  6.25 mg Oral BID WC   Chlorhexidine Gluconate Cloth  6 each Topical Daily   clopidogrel  75 mg Oral Daily   docusate sodium  100 mg Oral Daily   dronabinol  2.5 mg Oral BID AC   enoxaparin (LOVENOX) injection  40 mg Subcutaneous Q24H   escitalopram  10 mg Oral Daily   hydrALAZINE  50 mg Oral Q8H   insulin aspart  0-15 Units Subcutaneous TID WC   insulin aspart  3 Units Subcutaneous TID WC   insulin glargine-yfgn  5 Units Subcutaneous BID   mirtazapine  7.5 mg Oral QHS   pantoprazole  40 mg Oral Daily   sodium chloride flush  3 mL Intravenous Q12H   Continuous Infusions:  lactated ringers 10 mL/hr at 10/02/22 0941   piperacillin-tazobactam (ZOSYN)  IV 3.375 g (10/03/22 0530)      LOS: 1 day   Charlynne Cousins  [facility]  10/03/2022, 8:40 AM

## 2022-10-03 NOTE — Procedures (Addendum)
Patient Name: Heather [name]  MRN: [id number]  Epilepsy Attending: Lora Havens  Referring Physician/Provider: Amie Portland, MD  Duration: 10/02/2022 1816 to 10/03/2022 1816   Patient history: 72yo F with ams. EEG to evaluate for seizure   Level of alertness: Awake   AEDs during EEG study: None   Technical aspects: This EEG study was done with scalp electrodes positioned according to the 10-20 International system of electrode placement. Electrical activity was reviewed with band pass filter of 1-70Hz$ , sensitivity of 7 uV/mm, display speed of 41m/sec with a 60Hz$  notched filter applied as appropriate. EEG data were recorded continuously and digitally stored.  Video monitoring was available and reviewed as appropriate.   Description: EEG initially showed continuous generalized 3 to 6 Hz theta-delta slowing. Generalized periodic discharges with triphasic morphology were noted with fluctuating frequency of 1.5 to 3hz$  Hz. IV ativan 217mwas administered at 1829 on 10/02/2022. After 1831, EEG improved significantly. 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   Subsequently EEG showed posterior dominant rhythm of 7Hz$  activity of moderate voltage (25-35 uV) seen predominantly in posterior head regions, symmetric and reactive to eye opening and eye closing. EEG also showed intermittent generalized 3-7hz$  theta-delta slowing. Hyperventilation and photic stimulation were not performed.      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 IV ativan 65m1mas administered at [place] 2/8/202 after which status epilepticus resolved. .  Subsequently EEG was suggestive of moderate diffuse encephalopathy, nonspecific etiology.     Luiz Trumpower O YBarbra Sarks

## 2022-10-03 NOTE — TOC Initial Note (Signed)
Transition of Care [facility]) - Initial/Assessment Note    Patient Details  Name: Heather [name] MRN: [id number] Date of Birth: 1950-10-25  Transition of Care [facility]) CM/SW Contact:    Coralee Pesa, [place] Phone Number: 10/03/2022, 12:29 PM  Clinical Narrative:                  CSW advised pt is from [facility] and had been readmitted after only 18 hours at the facility. CSW noting pt is disoriented x2 at this time and spoke to dtr [name]. Dtr confirms pt is from [place] and they would like pt to return to SNF. Dtr asked if referrals can be sent out again to see if something closer to [place] could be found. CSW advised of barriers with insurance, but sent to in network facilities and called [place] to review. Dtr's agreeable to going back to [place] if no closer options become available. A new insurance authorization will need to be approved prior to DC. FL2 completed. TOC will continue to follow for DC needs.  Expected Discharge Plan: Skilled Nursing Facility Barriers to Discharge: Insurance Authorization, SNF Pending bed offer, Continued Medical Work up   Patient Goals and CMS Choice Patient states their goals for this hospitalization and ongoing recovery are:: Pt disoriented and unable to participate in DC planning. CMS Medicare.gov Compare Post Acute Care list provided to:: Patient Represenative (must comment) Choice offered to / list presented to : Adult [place] ownership interest in [facility].provided to:: Adult Children    Expected Discharge Plan and Services     Post Acute Care Choice: [place] arrangements for the past 2 months: Single Family Home                                      Prior Living Arrangements/Services Living arrangements for the past 2 months: Single Family Home Lives with:: Self, Spouse Patient language and need for interpreter reviewed:: Yes        Need for Family Participation in Patient Care: Yes  (Comment) Care giver support system in place?: Yes (comment)   Criminal Activity/Legal Involvement Pertinent to Current Situation/Hospitalization: No - Comment as needed  Activities of Daily Living      Permission Sought/Granted Permission sought to share information with : Family Supports Permission granted to share information with : Yes, Verbal Permission Granted  Share Information with NAME: [name]  Permission granted to share info w AGENCY: SNF's  Permission granted to share info w Relationship: Daughter  Permission granted to share info w Contact Information: [phone]  Emotional Assessment Appearance:: Appears stated age Attitude/Demeanor/Rapport: Unable to Assess Affect (typically observed): Unable to Assess Orientation: : Oriented to Self, Oriented to Place Alcohol / Substance Use: Not Applicable Psych Involvement: No (comment)  Admission diagnosis:  Failure to thrive (child) [R62.51] Failure to thrive in adult [R62.7] Altered mental status, unspecified altered mental status type [id number] Acute metabolic encephalopathy [id number] Patient Active Problem List   Diagnosis Date Noted   Failure to thrive in adult [id number]   Acute metabolic encephalopathy [id number]   Normocytic anemia 10/01/2022   Acute encephalopathy 09/28/2022   Surgical site infection 09/15/2022   Decreased oral intake 09/13/2022   Chronic diastolic CHF (congestive heart failure) ([place]) 09/13/2022   Dysphagia 09/13/2022   PVD (peripheral vascular disease) ([place]) 09/11/2022   Right groin wound 09/10/2022   Wound of left groin 09/10/2022  Pulmonary edema 08/16/2022   Abscess of anal and rectal regions 08/15/2022   Decubitus ulcer of sacral region, stage 2 ([place]) 08/15/2022   ICH (intracerebral hemorrhage) ([place]) 08/04/2022   Atherosclerosis of native arteries of extremity with rest pain ([place]) 04/02/2022   PAD (peripheral artery disease) ([place]) 04/11/2021   Ischemic leg pain 04/11/2021   Chronic  mesenteric ischemia ([place]) 12/17/2020   Malnutrition of moderate degree 11/12/2020   Asymptomatic bacteriuria 11/10/2020   Abdominal pain 11/09/2020   Hypokalemia 11/09/2020   AKI (acute kidney injury) ([place]) 11/09/2020   MDD (major depressive disorder), recurrent, in full remission ([place]) 01/16/2020   Anxiety disorder 01/16/2020   Hyperlipidemia 12/15/2019   Carotid stenosis 12/15/2019   MDD (major depressive disorder), recurrent, in partial remission ([place]) 08/24/2019   MDD (major depressive disorder), recurrent episode, mild ([place]) 04/08/2019   Insomnia due to mental condition 04/08/2019   Major neurocognitive disorder due to Alzheimer's disease, probable, without behavioral disturbance 04/08/2019   DDD (degenerative disc disease), cervical 12/19/2016   Frequent falls 12/10/2016   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   Nonrheumatic aortic valve stenosis 09/30/2016   (HFpEF) heart failure with preserved ejection fraction ([place]) 09/20/2016   COPD (chronic obstructive pulmonary disease) ([place]) 09/20/2016   Respiratory failure with hypoxia ([place]) 09/20/2016   NSTEMI (non-ST elevated myocardial infarction) ([place]) 08/21/2016   SOB (shortness of breath) 08/18/2016   Atherosclerosis of native arteries of extremity with intermittent claudication ([place]) 01/08/2016   Palpitations 07/10/2015   History of nonmelanoma skin cancer 03/16/2015   Pseudoaneurysm following procedure ([place]) 07/19/2014   Femoral artery occlusion, right ([place]) 06/28/2014   Claudication ([place]) 06/05/2014   Aortic insufficiency 03/20/2014   CAD (coronary artery disease) 03/20/2014   Chronic pain 03/20/2014   Memory loss 11/09/2012   Depressive disorder 08/02/2012   Neck pain 08/02/2012   Malaise and fatigue 08/02/2012   Hypertension, benign 08/02/2012   Dizziness 08/02/2012   Polyneuropathy in diabetes ([place]) 08/02/2012   Rheumatic fever with heart involvement 08/02/2012   Tobacco use disorder 08/02/2012    Diabetes mellitus ([place]) 08/02/1990   PCP:  [name], FNP Pharmacy:  No Pharmacies Listed    Social Determinants of Health (SDOH) Social History: SDOH Screenings   Food Insecurity: No Food Insecurity (09/28/2022)  Recent Concern: Food Insecurity - Food Insecurity Present (08/11/2022)  Housing: Low Risk  (09/28/2022)  Transportation Needs: No Transportation Needs (09/28/2022)  Utilities: Not At Risk (09/28/2022)  Financial Resource Strain: Medium Risk (10/19/2018)  Physical Activity: Inactive (10/19/2018)  Social Connections: Unknown (10/19/2018)  Tobacco Use: High Risk (09/28/2022)   SDOH Interventions:     Readmission Risk Interventions    11/13/2020    2:27 PM  Readmission Risk Prevention Plan  Transportation Screening Complete  PCP or Specialist Appt within 3-5 Days Complete  HRI or [place] Complete  Social Work Consult for [place] Planning/Counseling Complete  Palliative Care Screening Not Applicable  Medication Review Press photographer) Complete

## 2022-10-03 NOTE — NC FL2 (Signed)
[place] LEVEL OF CARE FORM     IDENTIFICATION  Patient Name: Heather [name] Birthdate: April 09, 1951 Sex: female Admission Date (Current Location): 10/01/2022  [facility] and [place] Number:  Herbalist and Address:  [facility] [place] [address] [place], [place] [address]      Provider Number: [id number]  Attending Physician Name and Address:  Charlynne Cousins, MD  Relative Name and Phone Number:  Lenn Cal,  [phone]    Current Level of Care: Hospital Recommended Level of Care: [place] Prior Approval Number:    Date Approved/Denied:   PASRR Number: [id number]  Discharge Plan: SNF    Current Diagnoses: Patient Active Problem List   Diagnosis Date Noted   Failure to thrive in adult [id number]   Acute metabolic encephalopathy [id number]   Normocytic anemia 10/01/2022   Acute encephalopathy 09/28/2022   Surgical site infection 09/15/2022   Decreased oral intake 09/13/2022   Chronic diastolic CHF (congestive heart failure) ([place]) 09/13/2022   Dysphagia 09/13/2022   PVD (peripheral vascular disease) ([place]) 09/11/2022   Right groin wound 09/10/2022   Wound of left groin 09/10/2022   Pulmonary edema 08/16/2022   Abscess of anal and rectal regions 08/15/2022   Decubitus ulcer of sacral region, stage 2 ([place]) 08/15/2022   ICH (intracerebral hemorrhage) ([place]) 08/04/2022   Atherosclerosis of native arteries of extremity with rest pain ([place]) 04/02/2022   PAD (peripheral artery disease) ([place]) 04/11/2021   Ischemic leg pain 04/11/2021   Chronic mesenteric ischemia ([place]) 12/17/2020   Malnutrition of moderate degree 11/12/2020   Asymptomatic bacteriuria 11/10/2020   Abdominal pain 11/09/2020   Hypokalemia 11/09/2020   AKI (acute kidney injury) ([place]) 11/09/2020   MDD (major depressive disorder), recurrent, in full remission ([place]) 01/16/2020   Anxiety disorder 01/16/2020   Hyperlipidemia 12/15/2019   Carotid  stenosis 12/15/2019   MDD (major depressive disorder), recurrent, in partial remission ([place]) 08/24/2019   MDD (major depressive disorder), recurrent episode, mild ([place]) 04/08/2019   Insomnia due to mental condition 04/08/2019   Major neurocognitive disorder due to Alzheimer's disease, probable, without behavioral disturbance 04/08/2019   DDD (degenerative disc disease), cervical 12/19/2016   Frequent falls 12/10/2016   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   Nonrheumatic aortic valve stenosis 09/30/2016   (HFpEF) heart failure with preserved ejection fraction ([place]) 09/20/2016   COPD (chronic obstructive pulmonary disease) ([place]) 09/20/2016   Respiratory failure with hypoxia ([place]) 09/20/2016   NSTEMI (non-ST elevated myocardial infarction) ([place]) 08/21/2016   SOB (shortness of breath) 08/18/2016   Atherosclerosis of native arteries of extremity with intermittent claudication ([place]) 01/08/2016   Palpitations 07/10/2015   History of nonmelanoma skin cancer 03/16/2015   Pseudoaneurysm following procedure ([place]) 07/19/2014   Femoral artery occlusion, right ([place]) 06/28/2014   Claudication ([place]) 06/05/2014   Aortic insufficiency 03/20/2014   CAD (coronary artery disease) 03/20/2014   Chronic pain 03/20/2014   Memory loss 11/09/2012   Depressive disorder 08/02/2012   Neck pain 08/02/2012   Malaise and fatigue 08/02/2012   Hypertension, benign 08/02/2012   Dizziness 08/02/2012   Polyneuropathy in diabetes ([place]) 08/02/2012   Rheumatic fever with heart involvement 08/02/2012   Tobacco use disorder 08/02/2012   Diabetes mellitus ([place]) 08/02/1990    Orientation RESPIRATION BLADDER Height & Weight     Self, Place  Normal Incontinent, External catheter Weight:   Height:     BEHAVIORAL SYMPTOMS/MOOD NEUROLOGICAL BOWEL NUTRITION STATUS      Incontinent Diet (  See DC summary)  AMBULATORY STATUS COMMUNICATION OF NEEDS Skin   Extensive Assist Verbally PU Stage and Appropriate  Care, Other (Comment), Wound Vac (Wound vac on groin, PI st 2 on buttocks, non pressure injury on Buttocks)                       Personal Care Assistance Level of Assistance  Bathing, Feeding, Dressing Bathing Assistance: Limited assistance Feeding assistance: Limited assistance Dressing Assistance: Limited assistance     Functional Limitations Info  Sight, Hearing, Speech Sight Info: Impaired Hearing Info: Impaired Speech Info: Adequate    SPECIAL CARE FACTORS FREQUENCY  PT (By licensed PT), OT (By licensed OT)     PT Frequency: 5x week OT Frequency: 5x week            Contractures Contractures Info: Not present    Additional Factors Info  Code Status, Allergies, Psychotropic, Insulin Sliding Scale Code Status Info: Full Allergies Info: Iodinated Contrast Media, Sulfa Antibiotics, Shellfish Allergy Psychotropic Info: Lexapro, Hydralazine Insulin Sliding Scale Info: See DC summary       Current Medications (10/03/2022):  This is the current hospital active medication list Current Facility-Administered Medications  Medication Dose Route Frequency Provider Last Rate Last Admin   acetaminophen (TYLENOL) tablet 650 mg  650 mg Oral Q6H PRN Fuller Plan A, MD   650 mg at 10/03/22 0754   Or   acetaminophen (TYLENOL) suppository 650 mg  650 mg Rectal Q6H PRN Fuller Plan A, MD       albuterol (PROVENTIL) (2.5 MG/3ML) 0.083% nebulizer solution 2.5 mg  2.5 mg Nebulization Q6H PRN Tamala Julian, Rondell A, MD       amLODipine (NORVASC) tablet 10 mg  10 mg Oral Daily Tamala Julian, Rondell A, MD   10 mg at 10/03/22 0832   atorvastatin (LIPITOR) tablet 80 mg  80 mg Oral QHS Mikenna Bunkley, Rondell A, MD   80 mg at 10/02/22 2119   carvedilol (COREG) tablet 6.25 mg  6.25 mg Oral BID WC Kahlin Mark, Rondell A, MD   6.25 mg at 10/03/22 0754   Chlorhexidine Gluconate Cloth 2 % PADS 6 each  6 each Topical Daily Fuller Plan A, MD   6 each at 10/03/22 [id number]   clopidogrel (PLAVIX) tablet 75 mg  75 mg Oral  Daily Fuller Plan A, MD   75 mg at 10/03/22 [id number]   docusate sodium (COLACE) capsule 100 mg  100 mg Oral Daily Charlynne Cousins, MD   100 mg at 10/03/22 [id number]   dronabinol (MARINOL) capsule 2.5 mg  2.5 mg Oral BID AC Charlynne Cousins, MD   2.5 mg at 10/02/22 1615   enoxaparin (LOVENOX) injection 40 mg  40 mg Subcutaneous Q24H Fuller Plan A, MD   40 mg at 10/02/22 2119   escitalopram (LEXAPRO) tablet 10 mg  10 mg Oral Daily Fuller Plan A, MD   10 mg at 10/03/22 0831   hydrALAZINE (APRESOLINE) tablet 50 mg  50 mg Oral Q8H Everton Bertha, Rondell A, MD   50 mg at 10/03/22 0754   insulin aspart (novoLOG) injection 0-15 Units  0-15 Units Subcutaneous TID WC Kipper Buch, Rondell A, MD   3 Units at 10/03/22 0830   insulin aspart (novoLOG) injection 3 Units  3 Units Subcutaneous TID WC Allen Basista, Rondell A, MD   3 Units at 10/03/22 0830   insulin glargine-yfgn (SEMGLEE) injection 5 Units  5 Units Subcutaneous BID Norval Morton, MD   5 Units at 10/03/22 [id number]  lactated ringers infusion   Intravenous Continuous Charlynne Cousins, MD 10 mL/hr at 10/02/22 0941 Rate Change at 10/02/22 0941   mirtazapine (REMERON) tablet 7.5 mg  7.5 mg Oral QHS Fuller Plan A, MD   7.5 mg at 10/02/22 2119   pantoprazole (PROTONIX) EC tablet 40 mg  40 mg Oral Daily Charlynne Cousins, MD   40 mg at 10/03/22 0832   piperacillin-tazobactam (ZOSYN) IVPB 3.375 g  3.375 g Intravenous Q8H Reome, Earle J, RPH 12.5 mL/hr at 10/03/22 0530 3.375 g at 10/03/22 0530   sodium chloride flush (NS) 0.9 % injection 10-40 mL  10-40 mL Intracatheter PRN Fuller Plan A, MD       sodium chloride flush (NS) 0.9 % injection 3 mL  3 mL Intravenous Q12H Dorri Ozturk, Rondell A, MD   3 mL at 10/02/22 2125   traZODone (DESYREL) tablet 25 mg  25 mg Oral QHS PRN Norval Morton, MD         Discharge Medications: Please see discharge summary for a list of discharge medications.  Relevant Imaging Results:  Relevant Lab Results:   Additional  Information SSN [place] [address], [place]

## 2022-10-03 NOTE — Procedures (Signed)
Patient Name: Heather [name]  MRN: [id number]  Epilepsy Attending: Lora Havens  Referring Physician/Provider: Amie Portland, MD  Duration: 10/03/2022 1816 to 10/04/2022 1045   Patient history: 72yo F with ams. EEG to evaluate for seizure   Level of alertness: Awake, asleep   AEDs during EEG study: LEV   Technical aspects: This EEG study was done with scalp electrodes positioned according to the 10-20 International system of electrode placement. Electrical activity was reviewed with band pass filter of 1-70Hz$ , sensitivity of 7 uV/mm, display speed of 25m/sec with a 60Hz$  notched filter applied as appropriate. EEG data were recorded continuously and digitally stored.  Video monitoring was available and reviewed as appropriate.   Description: EEG showed posterior dominant rhythm of 7Hz$  activity of moderate voltage (25-35 uV) seen predominantly in posterior head regions, symmetric and reactive to eye opening and eye closing. Sleep was characterized by sleep spindles (12-14Hz$ ), maximal fronto-central region. EEG also showed intermittent generalized 3-7hz$  theta-delta slowing. Hyperventilation and photic stimulation were not performed.     ABNORMALITY - Intermittent slow, generalized   IMPRESSION: This study was suggestive of moderate diffuse encephalopathy, nonspecific etiology.  No seizures or definite epileptiform discharges were seen during this study.   Noelani Harbach OBarbra Sarks

## 2022-10-04 DIAGNOSIS — R4182 Altered mental status, unspecified: Secondary | ICD-10-CM | POA: Diagnosis not present

## 2022-10-04 DIAGNOSIS — G9341 Metabolic encephalopathy: Secondary | ICD-10-CM | POA: Diagnosis not present

## 2022-10-04 LAB — GLUCOSE, CAPILLARY
Glucose-Capillary: 170 mg/dL — ABNORMAL HIGH (ref 70–99)
Glucose-Capillary: 252 mg/dL — ABNORMAL HIGH (ref 70–99)
Glucose-Capillary: 163 mg/dL — ABNORMAL HIGH (ref 70–99)
Glucose-Capillary: 156 mg/dL — ABNORMAL HIGH (ref 70–99)

## 2022-10-04 MED ORDER — INSULIN ASPART 100 UNIT/ML IJ SOLN
3.0000 [IU] | Freq: Three times a day (TID) | INTRAMUSCULAR | Status: DC
  Administered 2022-10-04 – 2022-10-06 (×6): 3 [IU] via SUBCUTANEOUS

## 2022-10-04 MED ORDER — INSULIN ASPART 100 UNIT/ML IJ SOLN
0.0000 [IU] | Freq: Three times a day (TID) | INTRAMUSCULAR | Status: DC
  Administered 2022-10-04: 3 [IU] via SUBCUTANEOUS
  Administered 2022-10-04: 8 [IU] via SUBCUTANEOUS
  Administered 2022-10-05 (×3): 5 [IU] via SUBCUTANEOUS
  Administered 2022-10-06: 8 [IU] via SUBCUTANEOUS

## 2022-10-04 MED ORDER — INSULIN ASPART 100 UNIT/ML IJ SOLN
0.0000 [IU] | Freq: Every day | INTRAMUSCULAR | Status: DC
  Administered 2022-10-05: 3 [IU] via SUBCUTANEOUS

## 2022-10-04 NOTE — Progress Notes (Addendum)
LTM maint complete - no skin breakdown Fp1 Fp2 [facility] monitored, Event button test confirmed by [facility]. Patient is asleep not able to get all impedance below 5kOhms

## 2022-10-04 NOTE — Progress Notes (Signed)
LTM EEG discontinued - no skin breakdown at unhook.   

## 2022-10-04 NOTE — Progress Notes (Signed)
Neurology Progress Note  S:// Patient seen and examined.  On LTM EEG overnight.  No seizures over the past 24 hours.  Intermittent generalized slowing.  O:// Current vital signs: BP (!) 152/69 (BP Location: Right Arm)   Pulse 98   Temp 99.2 F (37.3 C) (Oral)   Resp 16   SpO2 98%  Vital signs in last 24 hours: Temp:  [98.3 F (36.8 C)-99.3 F (37.4 C)] 99.2 F (37.3 C) (02/10 0739) Pulse Rate:  [95-110] 98 (02/10 0739) Resp:  [16-18] 16 (02/10 0739) BP: (115-152)/(51-72) 152/69 (02/10 0739) SpO2:  [98 %-99 %] 98 % (02/10 0739) General comfortably sleeping in bed HEENT: Normocephalic atraumatic Lungs: Clear Neurological exam Patient sleeping in bed Upon waking up she was able to tell me her name and date of birth. She was not able to tell me where she is at.  Followed commands Was able to name simple objects.  Mildly dysarthric speech, mainly due to edentulousness. Cranial nerves II to XII intact Motor examination with antigravity nearly 5/5 strength in both arms.  Left AKA.  4/5 strength in the right lower extremity.  She still continues to have mild asterixis on outstretched arms Sensation is intact Coordination: Finger-to-nose intact, asterixis seen  Medications  Current Facility-Administered Medications:    acetaminophen (TYLENOL) tablet 650 mg, 650 mg, Oral, Q6H PRN, 650 mg at 10/03/22 0754 **OR** acetaminophen (TYLENOL) suppository 650 mg, 650 mg, Rectal, Q6H PRN, Tamala Julian, Rondell A, MD   albuterol (PROVENTIL) (2.5 MG/3ML) 0.083% nebulizer solution 2.5 mg, 2.5 mg, Nebulization, Q6H PRN, Smith, Rondell A, MD   amLODipine (NORVASC) tablet 10 mg, 10 mg, Oral, Daily, Smith, Rondell A, MD, 10 mg at 10/03/22 [id number]   atorvastatin (LIPITOR) tablet 80 mg, 80 mg, Oral, QHS, Smith, Rondell A, MD, 80 mg at 10/03/22 2128   carvedilol (COREG) tablet 6.25 mg, 6.25 mg, Oral, BID WC, Smith, Rondell A, MD, 6.25 mg at 10/03/22 1719   Chlorhexidine Gluconate Cloth 2 % PADS 6 each, 6 each,  Topical, Daily, Fuller Plan A, MD, 6 each at 10/03/22 [id number]   clopidogrel (PLAVIX) tablet 75 mg, 75 mg, Oral, Daily, Tamala Julian, Rondell A, MD, 75 mg at 10/03/22 [id number]   docusate sodium (COLACE) capsule 100 mg, 100 mg, Oral, Daily, Aileen Fass, Tammi Klippel, MD, 100 mg at 10/03/22 [id number]   dronabinol (MARINOL) capsule 2.5 mg, 2.5 mg, Oral, BID AC, Charlynne Cousins, MD, 2.5 mg at 10/03/22 1719   enoxaparin (LOVENOX) injection 40 mg, 40 mg, Subcutaneous, Q24H, Smith, Rondell A, MD, 40 mg at 10/03/22 2125   escitalopram (LEXAPRO) tablet 10 mg, 10 mg, Oral, Daily, Smith, Rondell A, MD, 10 mg at 10/03/22 0831   hydrALAZINE (APRESOLINE) tablet 50 mg, 50 mg, Oral, Q8H, Smith, Rondell A, MD, 50 mg at 10/04/22 0531   insulin aspart (novoLOG) injection 0-15 Units, 0-15 Units, Subcutaneous, TID WC, Smith, Rondell A, MD, 5 Units at 10/03/22 1718   insulin aspart (novoLOG) injection 3 Units, 3 Units, Subcutaneous, TID WC, Smith, Rondell A, MD, 3 Units at 10/03/22 1719   insulin glargine-yfgn (SEMGLEE) injection 5 Units, 5 Units, Subcutaneous, BID, Tamala Julian, Rondell A, MD, 5 Units at 10/03/22 2126   lactated ringers infusion, , Intravenous, Continuous, Charlynne Cousins, MD, Last Rate: 10 mL/hr at 10/04/22 0500, Infusion Verify at 10/04/22 0500   levETIRAcetam (KEPPRA) tablet 500 mg, 500 mg, Oral, BID, Amie Portland, MD, 500 mg at 10/03/22 2128   mirtazapine (REMERON) tablet 7.5 mg, 7.5 mg, Oral, QHS, Smith, Rondell A,  MD, 7.5 mg at 10/03/22 2128   pantoprazole (PROTONIX) EC tablet 40 mg, 40 mg, Oral, Daily, Charlynne Cousins, MD, 40 mg at 10/03/22 0832   piperacillin-tazobactam (ZOSYN) IVPB 3.375 g, 3.375 g, Intravenous, Q8H, Reome, Earle J, RPH, Last Rate: 12.5 mL/hr at 10/04/22 0534, 3.375 g at 10/04/22 0534   sodium chloride flush (NS) 0.9 % injection 10-40 mL, 10-40 mL, Intracatheter, PRN, Tamala Julian, Rondell A, MD   sodium chloride flush (NS) 0.9 % injection 3 mL, 3 mL, Intravenous, Q12H, Smith, Rondell A, MD, 3 mL  at 10/02/22 2125   traZODone (DESYREL) tablet 25 mg, 25 mg, Oral, QHS PRN, Norval Morton, MD  Labs CBC    Component Value Date/Time   WBC 6.2 10/02/2022 0340   RBC 3.64 (L) 10/02/2022 0340   HGB 10.6 (L) 10/02/2022 0340   HGB 14.3 06/20/2012 2127   HCT 33.2 (L) 10/02/2022 0340   HCT 42.9 06/20/2012 2127   PLT 442 (H) 10/02/2022 0340   PLT 357 06/20/2012 2127   MCV 91.2 10/02/2022 0340   MCV 91 06/20/2012 2127   MCH 29.1 10/02/2022 0340   MCHC 31.9 10/02/2022 0340   RDW 16.6 (H) 10/02/2022 0340   RDW 13.6 06/20/2012 2127   LYMPHSABS 1.1 10/01/2022 1445   MONOABS 0.7 10/01/2022 1445   EOSABS 0.3 10/01/2022 1445   BASOSABS 0.1 10/01/2022 1445   CMP     Component Value Date/Time   NA 135 10/02/2022 0340   NA 135 (L) 06/20/2012 2127   K 3.5 10/02/2022 0340   K 4.0 06/20/2012 2127   CL 100 10/02/2022 0340   CL 100 06/20/2012 2127   CO2 26 10/02/2022 0340   CO2 27 06/20/2012 2127   GLUCOSE 120 (H) 10/02/2022 0340   GLUCOSE 250 (H) 06/20/2012 2127   BUN 10 10/02/2022 0340   BUN 20 (H) 06/20/2012 2127   CREATININE 0.51 10/02/2022 0340   CREATININE 0.60 06/20/2012 2127   CALCIUM 9.3 10/02/2022 0340   CALCIUM 8.8 06/20/2012 2127   PROT 6.3 (L) 10/01/2022 1208   PROT 6.7 06/20/2012 2127   ALBUMIN 2.6 (L) 10/01/2022 1208   ALBUMIN 3.5 06/20/2012 2127   AST 16 10/01/2022 1208   AST 17 06/20/2012 2127   ALT 9 10/01/2022 1208   ALT 27 06/20/2012 2127   ALKPHOS 62 10/01/2022 1208   ALKPHOS 127 06/20/2012 2127   BILITOT <0.1 (L) 10/01/2022 1208   BILITOT 0.3 06/20/2012 2127   GFRNONAA >60 10/02/2022 0340   GFRNONAA >60 06/20/2012 2127   GFRAA >60 06/20/2012 2127    Lipid Panel     Component Value Date/Time   CHOL 96 08/05/2022 0400   TRIG 531 (H) 08/05/2022 0400   TRIG 535 (H) 08/05/2022 0400   HDL 25 (L) 08/05/2022 0400   CHOLHDL 3.8 08/05/2022 0400   VLDL UNABLE TO CALCULATE IF TRIGLYCERIDE OVER 400 mg/dL 08/05/2022 0400   LDLCALC UNABLE TO CALCULATE IF  TRIGLYCERIDE OVER 400 mg/dL 08/05/2022 0400   LDLDIRECT 34 08/05/2022 0400   Imaging EEG 2/9 This study initially showed generalized non-convulsive status epilepticus. IV ativan 84m was administered at [place] 2/8/202 after which status epilepticus resolved. .  Subsequently EEG was suggestive of moderate diffuse encephalopathy, nonspecific etiology.   EEG 2/10 ABNORMALITY - Intermittent slow, generalized IMPRESSION: This study was suggestive of moderate diffuse encephalopathy, nonspecific etiology.  No seizures or definite epileptiform discharges were seen during this study.  CT-head IMPRESSION: 1. No acute intracranial abnormality. 2. Cerebral atrophy and  chronic microvascular ischemic changes of the white matter, unchanged.  Assessment: Heather [name] is a 72 y.o. 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   Patient's EEG showed  generalized non-convulsive status epilepticus vs pattern concerning for cefepime toxicity.   IV ativan 60m was administered at [place] 2/8/202 after which  EEG pattern improved. This could be cefepime toxicity but it is also possible that she could hae had underlying ongoing nonconvulsive seizures that resolved with ativan.   Patient was changed from cefepime to pipercillin/tazobactam.   No seizure overnight. Exam with ability to follow commands, although still remains somewhat confused to where she is.  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 -seizure precautions Neurology f/u in 8-12  weeks.  -- AAmie Portland MD Neurologist [facility] Pager: [phone]

## 2022-10-04 NOTE — Progress Notes (Signed)
[facility] PROGRESS NOTE    Progress Note  Heather [name]  [id number] DOB: [birth year] DOA: 10/01/2022 PCP: [name], FNP     Brief Narrative:   Heather [name] is an 72 y.o. female past medical history of diabetes mellitus type 2, chronic diastolic heart failure, COPD, essential hypertension aortic stenosis status post TAVR's tobacco use and schizophrenia comes in due to noncompliance with her medication, she is currently on antibiotics cefepime and Flagyl stop date 10/28/2022 per vascular surgery and ID in the emergency room was found to be afebrile tachypneic, CT of the head showed no acute abnormalities patient was given 2 L of lactate.  Assessment/Plan:   Acute metabolic encephalopathy: Neurology was consulted recommended change antibiotics she is now on Zosyn. There are no signs of infection. Neurology also recommended Ativan challenge and her mentation has improved this morning after changing antibiotics and an Ativan challenge. She was empirically started on AEDs. She will need skilled nursing facility.   Right groin wound: Patient infected bilateral groin wound she is currently on IV cefepime and Flagyl with end date of 10/28/2022, she has remained afebrile with no leukocytosis.  Normocytic anemia: Hemoglobin appears to be at baseline.  Essential hypertension: She was restarted back on her Coreg hydralazine amlodipine her blood pressure is stabilized.  Uncontrolled diabetes mellitus type 2: With a last A1c of 13. Restart long-acting insulin plus sliding scale with CBGs before meals and at bedtime.  Sacral decubitus ulcer present on admission: Wound care has been consulted. RN Pressure Injury Documentation: Pressure Injury 08/04/22 Sacrum Stage 2 -  Partial thickness loss of dermis presenting as a shallow open injury with a red, pink wound bed without slough. (Active)  08/04/22 0100  Location: Sacrum  Location Orientation:   Staging: Stage 2 -  Partial  thickness loss of dermis presenting as a shallow open injury with a red, pink wound bed without slough.  Wound Description (Comments):   Present on Admission: Yes  Dressing Type None 10/03/22 0530     Pressure Injury 10/02/22 Ischial tuberosity Left Stage 2 -  Partial thickness loss of dermis presenting as a shallow open injury with a red, pink wound bed without slough. 1 cm x 2 cm x 0.1 cm (Active)  10/02/22 0800  Location: Ischial tuberosity  Location Orientation: Left  Staging: Stage 2 -  Partial thickness loss of dermis presenting as a shallow open injury with a red, pink wound bed without slough.  Wound Description (Comments): 1 cm x 2 cm x 0.1 cm  Present on Admission: Yes  Dressing Type Foam - Lift dressing to assess site every shift 10/03/22 1949    DVT prophylaxis: lovenox Family Communication:none Status is: Observation The patient remains OBS appropriate and will d/c before 2 midnights.    Code Status:     Code Status Orders  (From admission, onward)           Start     Ordered   10/01/22 1915  Full code  Continuous       Question:  By:  Answer:  Consent: discussion documented in EHR   10/01/22 1915           Code Status History     Date Active Date Inactive Code Status Order ID Comments User Context   09/28/2022 0101 09/30/2022 2041 Full Code [id number]  [name], DO ED   08/04/2022 0103 09/22/2022 2128 Full Code [id number]  [name], MD Inpatient   03/25/2022 1112 03/25/2022 1914 Full Code  [id number]  [name], MD Inpatient   05/28/2021 1207 05/28/2021 1916 Full Code [id number]  [name], MD Inpatient   04/12/2021 1644 04/13/2021 0235 Full Code [id number]  [name], MD Inpatient   11/09/2020 2351 11/15/2020 1556 Full Code [id number]  [name], DO ED         IV Access:   Peripheral IV   Procedures and diagnostic studies:   Overnight EEG with video  Result Date: 10/03/2022 Lora Havens, MD      10/03/2022 10:43 PM Patient Name: Heather [name] MRN: [id number] Epilepsy Attending: Lora Havens Referring Physician/Provider: Amie Portland, MD Duration: 10/02/2022 1816 to 10/03/2022 1816  Patient history: 72yo F with ams. EEG to evaluate for seizure  Level of alertness: Awake  AEDs during EEG study: None  Technical aspects: This EEG study was done with scalp electrodes positioned according to the 10-20 International system of electrode placement. Electrical activity was reviewed with band pass filter of 1-70Hz$ , sensitivity of 7 uV/mm, display speed of 61m/sec with a 60Hz$  notched filter applied as appropriate. EEG data were recorded continuously and digitally stored.  Video monitoring was available and reviewed as appropriate.  Description: EEG initially showed continuous generalized 3 to 6 Hz theta-delta slowing. Generalized periodic discharges with triphasic morphology were noted with fluctuating frequency of 1.5 to 3hz$  Hz. IV ativan 254mwas administered at 1829 on 10/02/2022. After 1831, EEG improved significantly. 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 Subsequently EEG showed posterior dominant rhythm of 7Hz$  activity of moderate voltage (25-35 uV) seen predominantly in posterior head regions, symmetric and reactive to eye opening and eye closing. EEG also showed intermittent generalized 3-7hz$  theta-delta slowing. Hyperventilation and photic stimulation were not performed.    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 IV ativan 22m61mas administered at [place] 2/8/202 after which status epilepticus resolved. . Subsequently EEG was suggestive of moderate diffuse encephalopathy, nonspecific etiology.   PriLora HavensEEG adult  Result Date: 10/02/2022 YadLora HavensD     10/02/2022  7:50 PM Patient Name: Heather [name]: [id number]  Attending: PriLora Havensferring Physician/Provider: AroAmie PortlandD Date: 10/02/2022 Duration: 27.03 mins Patient history: [id number] ams. EEG to evaluate for seizure Level of alertness: Awake AEDs during EEG study: None Technical aspects: This EEG study was done with scalp electrodes positioned according to the 10-20 International system of electrode placement. Electrical activity was reviewed with band pass filter of 1-70Hz$ , sensitivity of 7 uV/mm, display speed of 58m73mc with a 60Hz$  notched filter applied as appropriate. EEG data were recorded continuously and digitally stored.  Video monitoring was available and reviewed as appropriate. Description: EEG showed continuous generalized 3 to 6 Hz theta-delta slowing. Generalized periodic discharges with triphasic morphology were noted with fluctuating frequency of 1.5 to 3hz$  Hz. Hyperventilation and photic stimulation were not performed.   ABNORMALITY - Periodic discharges with triphasic morphology, generalized ( GPDs) - Continuous slow, generalized IMPRESSION: This study showed generalized periodic discharges with triphasic morphology with fluctuating frequency of 1.5 to 3hz$  Hz. This eeg pattern is on the ictal-interictal continuum with high suspicion for ictal nature due to frequency of 3hz$ . Recommend ativan challenga and long term monitoring. Additionally there is moderate to severe diffuse encephalopathy, nonspecific etiology Dr. ArorRory Percy notified. [facility]:   None.   Subjective:  [name] awake today to carry on a conversation.  Objective:    Vitals:   10/03/22 1639 10/03/22 1949 10/04/22 0520 10/04/22 0739  BP: 133/72 123/71 (!) 115/51 (!) 152/69  Pulse: (!) 109 (!) 110 95 98  Resp: 16 18 17 16  $ Temp: 98.8 F (37.1 C) 98.3 F (36.8 C) 99.3 F (37.4 C) 99.2 F (37.3 C)  TempSrc: Oral Oral Oral Oral  SpO2: 99% 98% 99% 98%   SpO2: 98 %   Intake/Output Summary (Last 24 hours) at  10/04/2022 0856 Last data filed at 10/04/2022 0500 Gross per 24 hour  Intake 2523.34 ml  Output 300 ml  Net 2223.34 ml    There were no vitals filed for this visit.  Exam: General exam: In no acute distress. Respiratory system: Good air movement and clear to auscultation. Cardiovascular system: S1 & S2 heard, RRR. No JVD. Gastrointestinal system: Abdomen is nondistended, soft and nontender.  Extremities: No pedal edema. Skin: No rashes, lesions or ulcers Psychiatry: Judgement and insight appear normal. Mood & affect appropriate.   Data Reviewed:    Labs: Basic Metabolic Panel: Recent Labs  Lab 09/27/22 1647 09/28/22 0208 10/01/22 1208 10/02/22 0340  NA 135 135 134* 135  K 3.9 3.5 3.8 3.5  CL 103 103 100 100  CO2 23 23 24 26  $ GLUCOSE 246* 178* 177* 120*  BUN 9 9 14 10  $ CREATININE 0.66 0.65 0.67 0.51  CALCIUM 9.1 9.2 9.3 9.3    GFR Estimated Creatinine Clearance: 51.9 mL/min (by C-G formula based on SCr of 0.51 mg/dL). Liver Function Tests: Recent Labs  Lab 09/27/22 1647 09/28/22 0208 10/01/22 1208  AST 19 16 16  $ ALT 8 8 9  $ ALKPHOS 67 61 62  BILITOT 0.4 0.4 <0.1*  PROT 6.1* 6.1* 6.3*  ALBUMIN 2.4* 2.5* 2.6*    No results for input(s): "LIPASE", "AMYLASE" in the last 168 hours. Recent Labs  Lab 09/27/22 1648 10/01/22 1208  AMMONIA 15 17    Coagulation profile Recent Labs  Lab 10/01/22 1313 10/01/22 1445  INR 1.0 1.1    COVID-19 Labs  No results for input(s): "DDIMER", "FERRITIN", "LDH", "CRP" in the last 72 hours.  Lab Results  Component Value Date   SARSCOV2NAA POSITIVE (A) 09/18/2022   SARSCOV2NAA NEGATIVE 08/03/2022   Reading NEGATIVE 11/10/2020    CBC: Recent Labs  Lab 09/27/22 1647 09/28/22 0208 10/01/22 1445 10/02/22 0340  WBC 6.4 7.1 6.6 6.2  NEUTROABS 4.0  --  4.4  --   HGB 10.7* 10.7* 11.2* 10.6*  HCT 32.5* 33.4* 33.5* 33.2*  MCV 95.3 94.6 92.5 91.2  PLT 503* 483* 434* 442*    Cardiac Enzymes: No results for  input(s): "CKTOTAL", "CKMB", "CKMBINDEX", "TROPONINI" in the last 168 hours. BNP (last 3 results) No results for input(s): "PROBNP" in the last 8760 hours. CBG: Recent Labs  Lab 10/03/22 0754 10/03/22 1149 10/03/22 1642 10/03/22 2113 10/04/22 0746  GLUCAP 165* 183* 244* 242* 170*    D-Dimer: No results for input(s): "DDIMER" in the last 72 hours. Hgb A1c: No results for input(s): "HGBA1C" in the last 72 hours. Lipid Profile: No results for input(s): "CHOL", "HDL", "LDLCALC", "TRIG", "CHOLHDL", "LDLDIRECT" in the last 72 hours. Thyroid function studies: Recent Labs    10/02/22 0500  TSH 3.025    Anemia work up: Recent Labs    10/02/22 [place]    Sepsis Labs: Recent Labs  Lab 09/27/22 1647 09/27/22 1648 09/28/22 0208 10/01/22 1208 10/01/22 1313  10/01/22 1445 10/02/22 0340  WBC 6.4  --  7.1  --   --  6.6 6.2  LATICACIDVEN  --  0.9  --  1.0 0.7  --   --     Microbiology Recent Results (from the past 240 hour(s))  MRSA Next Gen by PCR, Nasal     Status: None   Collection Time: 09/28/22  5:18 AM   Specimen: Wound; Nasal Swab  Result Value Ref Range Status   MRSA by PCR Next Gen NOT DETECTED NOT DETECTED Final    Comment: (NOTE) The GeneXpert MRSA Assay (FDA approved for NASAL specimens only), is one component of a comprehensive MRSA colonization surveillance program. It is not intended to diagnose MRSA infection nor to guide or monitor treatment for MRSA infections. Test performance is not FDA approved in patients less than 80 years old. Performed at [facility], [place] [address] [place], [place] [address]   Blood Culture (routine x 2)     Status: None (Preliminary result)   Collection Time: 10/01/22  1:00 PM   Specimen: BLOOD  Result Value Ref Range Status   Specimen Description BLOOD RIGHT ANTECUBITAL  Final   Special Requests   Final    BOTTLES DRAWN AEROBIC AND ANAEROBIC Blood Culture results may not be optimal due to an excessive  volume of blood received in culture bottles   Culture   Final    NO GROWTH 3 DAYS Performed at [facility], [place] [address], [place], [place] [address]    Report Status PENDING  Incomplete  Blood Culture (routine x 2)     Status: None (Preliminary result)   Collection Time: 10/01/22  1:13 PM   Specimen: BLOOD LEFT FOREARM  Result Value Ref Range Status   Specimen Description BLOOD LEFT FOREARM  Final   Special Requests   Final    BOTTLES DRAWN AEROBIC AND ANAEROBIC Blood Culture adequate volume   Culture   Final    NO GROWTH 3 DAYS Performed at [facility], [place] [address] [place], [place] [address]    Report Status PENDING  Incomplete     Medications:    amLODipine  10 mg Oral Daily   atorvastatin  80 mg Oral QHS   carvedilol  6.25 mg Oral BID WC   Chlorhexidine Gluconate Cloth  6 each Topical Daily   clopidogrel  75 mg Oral Daily   docusate sodium  100 mg Oral Daily   dronabinol  2.5 mg Oral BID AC   enoxaparin (LOVENOX) injection  40 mg Subcutaneous Q24H   escitalopram  10 mg Oral Daily   hydrALAZINE  50 mg Oral Q8H   insulin aspart  0-15 Units Subcutaneous TID WC   insulin aspart  3 Units Subcutaneous TID WC   insulin glargine-yfgn  5 Units Subcutaneous BID   levETIRAcetam  500 mg Oral BID   mirtazapine  7.5 mg Oral QHS   pantoprazole  40 mg Oral Daily   sodium chloride flush  3 mL Intravenous Q12H   Continuous Infusions:  lactated ringers 10 mL/hr at 10/04/22 0500   piperacillin-tazobactam (ZOSYN)  IV 3.375 g (10/04/22 0534)      LOS: 2 days   Charlynne Cousins  [facility]  10/04/2022, 8:56 AM

## 2022-10-05 DIAGNOSIS — L89152 Pressure ulcer of sacral region, stage 2: Secondary | ICD-10-CM | POA: Diagnosis not present

## 2022-10-05 DIAGNOSIS — I10 Essential (primary) hypertension: Secondary | ICD-10-CM | POA: Diagnosis not present

## 2022-10-05 DIAGNOSIS — G9341 Metabolic encephalopathy: Secondary | ICD-10-CM | POA: Diagnosis not present

## 2022-10-05 DIAGNOSIS — R4182 Altered mental status, unspecified: Secondary | ICD-10-CM | POA: Diagnosis not present

## 2022-10-05 LAB — GLUCOSE, CAPILLARY
Glucose-Capillary: 247 mg/dL — ABNORMAL HIGH (ref 70–99)
Glucose-Capillary: 201 mg/dL — ABNORMAL HIGH (ref 70–99)
Glucose-Capillary: 225 mg/dL — ABNORMAL HIGH (ref 70–99)
Glucose-Capillary: 192 mg/dL — ABNORMAL HIGH (ref 70–99)

## 2022-10-05 MED ORDER — INSULIN GLARGINE-YFGN 100 UNIT/ML ~~LOC~~ SOLN
10.0000 [IU] | Freq: Two times a day (BID) | SUBCUTANEOUS | Status: DC
  Administered 2022-10-05 – 2022-10-06 (×2): 10 [IU] via SUBCUTANEOUS
  Filled 2022-10-05 (×4): qty 0.1

## 2022-10-05 NOTE — Progress Notes (Signed)
[facility] PROGRESS NOTE    Progress Note  Heather [name]  [id number] DOB: 1951-02-11 DOA: 10/01/2022 PCP: [name], FNP     Brief Narrative:   Heather [name] is an 72 y.o. female past medical history of diabetes mellitus type 2, chronic diastolic heart failure, COPD, essential hypertension aortic stenosis status post TAVR's tobacco use and schizophrenia comes in due to noncompliance with her medication, she is currently on antibiotics cefepime and Flagyl stop date 10/28/2022 per vascular surgery and ID in the emergency room was found to be afebrile tachypneic, CT of the head showed no acute abnormalities patient was given 2 L of lactate.  Assessment/Plan:   Acute metabolic encephalopathy: Neurology was consulted recommended change antibiotics she is now on Zosyn. 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 Physical therapy eval is pending. She will go back to skilled nursing facility.  Right groin wound: Patient infected bilateral groin wound she is currently on Zosyn with end date of 10/28/2022, she has remained afebrile with no leukocytosis. Her antibiotics were transitioned to Zosyn as it was believed that it was contributing to her encephalopathy.  Normocytic anemia: Hemoglobin appears to be at baseline.  Essential hypertension: She was restarted back on her Coreg hydralazine amlodipine her blood pressure is stabilized.  Uncontrolled diabetes mellitus type 2: With a last A1c of 13. Restart long-acting insulin plus sliding scale with CBGs before meals and at bedtime.  Sacral decubitus ulcer present on admission: Wound care has been consulted.  RN Pressure Injury Documentation: Pressure Injury 10/02/22 Ischial tuberosity Left Stage 2 -  Partial thickness loss of dermis presenting as a shallow open injury with a red, pink  wound bed without slough. 1 cm x 2 cm x 0.1 cm (Active)  10/02/22 0800  Location: Ischial tuberosity  Location Orientation: Left  Staging: Stage 2 -  Partial thickness loss of dermis presenting as a shallow open injury with a red, pink wound bed without slough.  Wound Description (Comments): 1 cm x 2 cm x 0.1 cm  Present on Admission: Yes  Dressing Type Foam - Lift dressing to assess site every shift;Silver hydrofiber 10/05/22 0619    DVT prophylaxis: lovenox Family Communication:none Status is: Observation The patient remains OBS appropriate and will d/c before 2 midnights.    Code Status:     Code Status Orders  (From admission, onward)           Start     Ordered   10/01/22 1915  Full code  Continuous       Question:  By:  Answer:  Consent: discussion documented in EHR   10/01/22 1915           Code Status History     Date Active Date Inactive Code Status Order ID Comments User Context   09/28/2022 0101 09/30/2022 2041 Full Code [id number]  [name], DO ED   08/04/2022 0103 09/22/2022 2128 Full Code [id number]  [name], MD Inpatient   03/25/2022 1112 03/25/2022 1914 Full Code [id number]  [name], MD Inpatient   05/28/2021 1207 05/28/2021 1916 Full Code [id number]  [name], MD Inpatient   04/12/2021 1644 04/13/2021 0235 Full Code [id number]  [name], MD Inpatient   11/09/2020 2351 11/15/2020 1556 Full Code [id number]  [name], DO ED         IV Access:  Peripheral IV   Procedures and diagnostic studies:   Overnight EEG with video  Result Date: 10/03/2022 Lora Havens, MD     10/03/2022 10:43 PM Patient Name: Heather [name] MRN: [id number] Epilepsy Attending: Lora Havens Referring Physician/Provider: Amie Portland, MD Duration: 10/02/2022 1816 to 10/03/2022 1816  Patient history: 72yo F with ams. EEG to evaluate for seizure  Level of alertness: Awake  AEDs during EEG study: None  Technical aspects: This EEG  study was done with scalp electrodes positioned according to the 10-20 International system of electrode placement. Electrical activity was reviewed with band pass filter of 1-70Hz$ , sensitivity of 7 uV/mm, display speed of 458m/sec with a 60Hz$  notched filter applied as appropriate. EEG data were recorded continuously and digitally stored.  Video monitoring was available and reviewed as appropriate.  Description: EEG initially showed continuous generalized 3 to 6 Hz theta-delta slowing. Generalized periodic discharges with triphasic morphology were noted with fluctuating frequency of 1.5 to 3hz$  Hz. IV ativan 254mwas administered at 1829 on 10/02/2022. After 1831, EEG improved significantly. 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 Subsequently EEG showed posterior dominant rhythm of 7Hz$  activity of moderate voltage (25-35 uV) seen predominantly in posterior head regions, symmetric and reactive to eye opening and eye closing. EEG also showed intermittent generalized 3-7hz$  theta-delta slowing. Hyperventilation and photic stimulation were not performed.    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 IV ativan 58m5mas administered at [place] 2/8/202 after which status epilepticus resolved. . Subsequently EEG was suggestive of moderate diffuse encephalopathy, nonspecific etiology.   [place]:   None.   Subjective:    [name] feels better her appetite has returned.  Objective:    Vitals:   10/04/22 1347 10/04/22 1604 10/04/22 2001 10/05/22 0426  BP: 133/69 137/61 125/60 117/70  Pulse: 97 97 92 92  Resp: 19 16 16 17  $ Temp: 99.3 F (37.4 C) 98.7 F (37.1 C) 98.4 F (36.9 C) 98.2 F (36.8 C)  TempSrc: Axillary Oral Axillary Oral  SpO2: 99% 97% 97% 94%   SpO2: 94 %   Intake/Output Summary  (Last 24 hours) at 10/05/2022 0818 Last data filed at 10/05/2022 [id number] per 24 hour  Intake 652.49 ml  Output 700 ml  Net -47.51 ml    There were no vitals filed for this visit.  Exam: General exam: In no acute distress. Respiratory system: Good air movement and clear to auscultation. Cardiovascular system: S1 & S2 heard, RRR. No JVD. Gastrointestinal system: Abdomen is nondistended, soft and nontender.  Extremities: No pedal edema. Skin: No rashes, lesions or ulcers Psychiatry: Judgement and insight appear normal. Mood & affect appropriate.   Data Reviewed:    Labs: Basic Metabolic Panel: Recent Labs  Lab 10/01/22 1208 10/02/22 0340  NA 134* 135  K 3.8 3.5  CL 100 100  CO2 24 26  GLUCOSE 177* 120*  BUN 14 10  CREATININE 0.67 0.51  CALCIUM 9.3 9.3    GFR Estimated Creatinine Clearance: 51.9 mL/min (by C-G formula based on SCr of 0.51 mg/dL). Liver Function Tests: Recent Labs  Lab 10/01/22 1208  AST 16  ALT 9  ALKPHOS 62  BILITOT <0.1*  PROT 6.3*  ALBUMIN 2.6*    No results for input(s): "LIPASE", "AMYLASE" in the last 168 hours. Recent Labs  Lab 10/01/22 1208  AMMONIA 17  Coagulation profile Recent Labs  Lab 10/01/22 1313 10/01/22 1445  INR 1.0 1.1    COVID-19 Labs  No results for input(s): "DDIMER", "FERRITIN", "LDH", "CRP" in the last 72 hours.  Lab Results  Component Value Date   SARSCOV2NAA POSITIVE (A) 09/18/2022   SARSCOV2NAA NEGATIVE 08/03/2022   [place] NEGATIVE 11/10/2020    CBC: Recent Labs  Lab 10/01/22 1445 10/02/22 0340  WBC 6.6 6.2  NEUTROABS 4.4  --   HGB 11.2* 10.6*  HCT 33.5* 33.2*  MCV 92.5 91.2  PLT 434* 442*    Cardiac Enzymes: No results for input(s): "CKTOTAL", "CKMB", "CKMBINDEX", "TROPONINI" in the last 168 hours. BNP (last 3 results) No results for input(s): "PROBNP" in the last 8760 hours. CBG: Recent Labs  Lab 10/03/22 2113 10/04/22 0746 10/04/22 1141 10/04/22 1704 10/04/22 2003   GLUCAP 242* 170* 252* 163* 156*    D-Dimer: No results for input(s): "DDIMER" in the last 72 hours. Hgb A1c: No results for input(s): "HGBA1C" in the last 72 hours. Lipid Profile: No results for input(s): "CHOL", "HDL", "LDLCALC", "TRIG", "CHOLHDL", "LDLDIRECT" in the last 72 hours. Thyroid function studies: No results for input(s): "TSH", "T4TOTAL", "T3FREE", "THYROIDAB" in the last 72 hours.  Invalid input(s): "FREET3"  Anemia work up: No results for input(s): "VITAMINB12", "FOLATE", "FERRITIN", "TIBC", "IRON", "RETICCTPCT" in the last 72 hours.  Sepsis Labs: Recent Labs  Lab 10/01/22 1208 10/01/22 1313 10/01/22 1445 10/02/22 0340  WBC  --   --  6.6 6.2  LATICACIDVEN 1.0 0.7  --   --     Microbiology Recent Results (from the past 240 hour(s))  MRSA Next Gen by PCR, Nasal     Status: None   Collection Time: 09/28/22  5:18 AM   Specimen: Wound; Nasal Swab  Result Value Ref Range Status   MRSA by PCR Next Gen NOT DETECTED NOT DETECTED Final    Comment: (NOTE) The GeneXpert MRSA Assay (FDA approved for NASAL specimens only), is one component of a comprehensive MRSA colonization surveillance program. It is not intended to diagnose MRSA infection nor to guide or monitor treatment for MRSA infections. Test performance is not FDA approved in patients less than 65 years old. Performed at [name], [place] [address], [place], [place] [address]   Blood Culture (routine x 2)     Status: None (Preliminary result)   Collection Time: 10/01/22  1:00 PM   Specimen: BLOOD  Result Value Ref Range Status   Specimen Description BLOOD RIGHT ANTECUBITAL  Final   Special Requests   Final    BOTTLES DRAWN AEROBIC AND ANAEROBIC Blood Culture results may not be optimal due to an excessive volume of blood received in culture bottles   Culture   Final    NO GROWTH 3 DAYS Performed at [facility], [place] [address], [place], [place] [address]    Report Status PENDING   Incomplete  Blood Culture (routine x 2)     Status: None (Preliminary result)   Collection Time: 10/01/22  1:13 PM   Specimen: BLOOD LEFT FOREARM  Result Value Ref Range Status   Specimen Description BLOOD LEFT FOREARM  Final   Special Requests   Final    BOTTLES DRAWN AEROBIC AND ANAEROBIC Blood Culture adequate volume   Culture   Final    NO GROWTH 3 DAYS Performed at [facility], [place] [address], [place],  [address]    Report Status PENDING  Incomplete  Medications:    amLODipine  10 mg Oral Daily   atorvastatin  80 mg Oral QHS   carvedilol  6.25 mg Oral BID WC   Chlorhexidine Gluconate Cloth  6 each Topical Daily   clopidogrel  75 mg Oral Daily   docusate sodium  100 mg Oral Daily   dronabinol  2.5 mg Oral BID AC   enoxaparin (LOVENOX) injection  40 mg Subcutaneous Q24H   escitalopram  10 mg Oral Daily   hydrALAZINE  50 mg Oral Q8H   insulin aspart  0-15 Units Subcutaneous TID WC   insulin aspart  0-5 Units Subcutaneous QHS   insulin aspart  3 Units Subcutaneous TID WC   insulin glargine-yfgn  5 Units Subcutaneous BID   levETIRAcetam  500 mg Oral BID   mirtazapine  7.5 mg Oral QHS   pantoprazole  40 mg Oral Daily   sodium chloride flush  3 mL Intravenous Q12H   Continuous Infusions:  lactated ringers 10 mL/hr at 10/05/22 0624   piperacillin-tazobactam (ZOSYN)  IV 12.5 mL/hr at 10/05/22 [id number]      LOS: 3 days   Charlynne Cousins  [facility]  10/05/2022, 8:18 AM

## 2022-10-05 NOTE — Evaluation (Signed)
Physical Therapy Evaluation Patient Details Name: Heather [name] MRN: [id number] DOB: Sep 02, 1950 Today's Date: 10/05/2022  History of Present Illness  Heather [name] is an 72 y.o. female admitted from SNF who was found to be encephalopathic, with R groin wound (getting antibiotics); Recent admission 12/11-1/29  for hemorrhagic cerebral stroke and BLE ischemia.  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 Prior to initial admission in mid-Dec 2023, pt lived with family (caregiver for husband with dementia), and was able to manage independently; Had been at SNF for rehab since last admission, and pt reports staff assisted with transfers to wc and ADLs; Presents to PT with difficulty flexing hips in sitting due to back pain, and likely bil groin VAC dressings factor in, leading to decr sitting balance/tolerance, functional dependencies, decr activity tolerance;  Needs 2 person Max/Total assist to sit up to EOB, and heavy posterior lean making upright sitting very difficult; Will plan to employ more rolling and sidelying to sit techniques next session; Also can use Maximove for OOB to recliner transfers -- I'm curious if she will be able to realx and flex hips more for upright sitting when fully supported in recliner; Pt currently with functional limitations due to the deficits listed below (see PT Problem List). Pt will benefit from skilled PT to increase their independence and safety with mobility to allow discharge to the venue listed below.          Recommendations for follow up therapy are one component  of a multi-disciplinary discharge planning process, led by the attending physician.  Recommendations may be updated based on patient status, additional functional criteria and insurance authorization.  Follow Up Recommendations Skilled nursing-short term rehab (<3 hours/day) Can patient physically be transported by private vehicle: No    Assistance Recommended at Discharge Frequent or constant Supervision/Assistance  Patient can return home with the following  Two people to help with walking and/or transfers;Two people to help with bathing/dressing/bathroom;Help with stairs or ramp for entrance;Assist for transportation;Assistance with Forensic psychologist (measurements PT);Wheelchair cushion (measurements PT);Hospital bed (sliding board, drop-arm [facility])  Recommendations for Other Services       Functional Status Assessment Patient has had a recent decline in their functional status and demonstrates the ability to make significant improvements in function in a reasonable and predictable amount of time.     Precautions / Restrictions Precautions Precautions: Fall;Other (comment) Precaution Comments: B groin wound vac Restrictions RLE Weight Bearing: Weight bearing as tolerated LLE Weight Bearing: Non weight bearing      Mobility  Bed Mobility Overal bed mobility: Needs Assistance Bed Mobility: Supine to Sit, Sit to Supine     Supine to sit: +2 for physical assistance, Max assist Sit to supine: Max assist, +2 for safety/equipment   General bed mobility comments: Pt uses bedrails well to move body in the bed; Min/light mod assist with bed pads to slide up in the bed, or reorient body position in prep to getting up to EOB; Had stepstool ready for R foot support in sitting EOB; Once in sitting position, difficulty flexing hips for upright sitting, heavy posterior lean, causing hips to  slide forward( too close to EOB); Had to lay back (with body  perpendicular to bed); +2 max assist to retry, still with heavy posterior lean, and we laid pt back down; repositioned in bed    Transfers                   General transfer comment: Unable to attempt due to heavy persitent posterior lean; Will consider Maximove    Ambulation/Gait                  Stairs            Wheelchair Mobility    Modified Rankin (Stroke Patients Only) Modified Rankin (Stroke Patients Only) Pre-Morbid Rankin Score: No significant disability (mid-Dec 2023) Modified Rankin: Severe disability     Balance Overall balance assessment: Needs assistance   Sitting balance-Leahy Scale: Zero Sitting balance - Comments: Constant Max/Total assist to sit up; support posteriorly and anteriorly with R knee block Postural control: Posterior lean                                   Pertinent Vitals/Pain Pain Assessment Pain Assessment: Faces Faces Pain Scale: Hurts even more Pain Location: back- chronic Pain Descriptors / Indicators: Grimacing, Guarding, Discomfort Pain Intervention(s): Monitored during session, Repositioned    Home Living Family/patient expects to be discharged to:: Skilled nursing facility   Available Help at Discharge: Family Type of Home: Mobile home Home Access: Stairs to enter Entrance Stairs-Rails: Psychiatric nurse of Steps: 6   Home Layout: One level Home Equipment: Cane - single point;Wheelchair - Engineer, manufacturing systems (2 wheels) Additional Comments: Plan for SNF for continued rehab and nursing care; above home info included for full picture and taken from previous therapy notes; was indepenent and driving as of mid December 2023    Prior Function Prior Level of Function : Needs assist             Mobility Comments: Reports was transferring bed to wc with SNF staff ADLs Comments: Assist from SNF staff     Hand Dominance   Dominant Hand: Right    Extremity/Trunk  Assessment   Upper Extremity Assessment Upper Extremity Assessment: Defer to OT evaluation    Lower Extremity Assessment Lower Extremity Assessment: RLE deficits/detail;LLE deficits/detail RLE Deficits / Details: increased edema, ankle ROM limited, grossly 2-/5; painful with contact guard/block at R knee and just distal to knee LLE Deficits / Details: s/p AKA, grossly weak    Cervical / Trunk Assessment Cervical / Trunk Assessment: Kyphotic  Communication   Communication: No difficulties  Cognition Arousal/Alertness: Awake/alert Behavior During Therapy: WFL for tasks assessed/performed Overall Cognitive Status: No family/caregiver present to determine baseline cognitive functioning                       Memory: Decreased short-term memory Following Commands: Follows one step commands consistently, Follows one step commands with increased time       General Comments: Difficulty describing how she transferred with SNF staff (to be fair, she was at SNF very briefly before coming back to hospital thsi admission)        General Comments General comments (skin integrity, edema, etc.): NAD on room air    Exercises     Assessment/Plan    PT Assessment Patient needs continued PT services  PT Problem List Decreased range of motion;Decreased mobility;Decreased knowledge of use of  DME;Decreased safety awareness;Decreased activity tolerance;Decreased strength       PT Treatment Interventions DME instruction;Functional mobility training;Balance training;Patient/family education;Therapeutic activities;Neuromuscular re-education;Wheelchair mobility training;Therapeutic exercise;Cognitive remediation    PT Goals (Current goals can be found in the Care Plan section)  Acute Rehab PT Goals Patient Stated Goal: Not specifically stated; We discussed continuing reahb at SNF PT Goal Formulation: With patient Time For Goal Achievement: 10/19/22 Potential to Achieve Goals: Fair     Frequency Min 2X/week     Co-evaluation PT/OT/SLP Co-Evaluation/Treatment: Yes Reason for Co-Treatment: For patient/therapist safety;To address functional/ADL transfers PT goals addressed during session: Mobility/safety with mobility;Balance;Strengthening/ROM OT goals addressed during session: ADL's and self-care       AM-PAC PT "6 Clicks" Mobility  Outcome Measure Help needed turning from your back to your side while in a flat bed without using bedrails?: A Lot Help needed moving from lying on your back to sitting on the side of a flat bed without using bedrails?: Total Help needed moving to and from a bed to a chair (including a wheelchair)?: Total Help needed standing up from a chair using your arms (e.g., wheelchair or bedside chair)?: Total Help needed to walk in hospital room?: Total Help needed climbing 3-5 steps with a railing? : Total 6 Click Score: 7    End of Session Equipment Utilized During Treatment: Other (comment) (bed pads and stepstool) Activity Tolerance: Patient limited by pain (repots back pain limiting ability to flex hips for upright sitting) Patient left: in bed;with call bell/phone within reach;with nursing/sitter in room Nurse Communication: Mobility status PT Visit Diagnosis: Muscle weakness (generalized) (M62.81);Other abnormalities of gait and mobility (R26.89) Pain - part of body:  (Low back pain)    Time: [id number] PT Time Calculation (min) (ACUTE ONLY): 17 min   Charges:   PT Evaluation $PT Eval Moderate Complexity: [place], [place] Office [phone]   Colletta [place] 10/05/2022, 9:54 AM

## 2022-10-05 NOTE — Evaluation (Signed)
Occupational Therapy Evaluation Patient Details Name: Heather [name] MRN: [id number] DOB: January 27, 1951 Today's Date: 10/05/2022   History of Present Illness Heather [name] is an 71 y.o. female admitted from SNF who was found to be encephalopathic, with R groin wound (getting antibiotics); Recent admission 12/11-1/29  for hemorrhagic cerebral stroke and BLE ischemia.  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 Pt max A +2 for bed mobility, leans posteriorly and is unable to balance sitting EOB. Discussed bed mobility via sidelying to sit vs supine to sit, would benefit from trial in future sessions. Pt presenting with impairments listed below, will follow acutely. Recommend SNF at d/c.     Recommendations for follow up therapy are one component of a multi-disciplinary discharge planning process, led by the attending physician.  Recommendations may be updated based on patient status, additional functional criteria and insurance authorization.   Follow Up Recommendations  Skilled nursing-short term rehab (<3 hours/day)     Assistance Recommended at Discharge Frequent or constant Supervision/Assistance  Patient can return home with the following Two people to help with walking and/or transfers;A lot of help with bathing/dressing/bathroom;Assistance with cooking/housework;Assistance with feeding;Direct supervision/assist for medications management;Direct supervision/assist for financial management;Assist for  transportation;Help with stairs or ramp for entrance    Functional Status Assessment  Patient has had a recent decline in their functional status and demonstrates the ability to make significant improvements in function in a reasonable and predictable amount of time.  Equipment Recommendations  Other (comment) (defer)    Recommendations for Other Services PT consult     Precautions / Restrictions Precautions Precautions: Fall;Other (comment) Precaution Comments: B groin wound vac Restrictions Weight Bearing Restrictions: Yes RLE Weight Bearing: Weight bearing as tolerated LLE Weight Bearing: Non weight bearing      Mobility Bed Mobility Overal bed mobility: Needs Assistance Bed Mobility: Supine to Sit, Sit to Supine     Supine to sit: +2 for physical assistance, Max assist Sit to supine: Max assist, +2 for safety/equipment   General bed mobility comments: Pt uses bedrails well to move body in the bed; Min/light mod assist with bed pads to slide up in the bed, or reorient body position in prep to getting up to EOB; Had stepstool ready for R foot support in sitting EOB; Once in sitting position, difficulty flexing hips for upright sitting, heavy posterior lean, causing hips to slide forward( too close to EOB); Had to lay back (with body perpendicular to bed); +2 max assist to retry, still with heavy posterior lean, and we laid pt back down; repositioned in bed    Transfers                   General transfer comment: unable to attempt, pt with strong posterior lean, likely lift for OOB      Balance Overall balance assessment: Needs assistance   Sitting balance-Leahy Scale: Zero Sitting balance - Comments: Constant Max/Total assist to sit up; support posteriorly and anteriorly with R knee block Postural control: Posterior lean  ADL either performed or assessed with clinical judgement   ADL Overall ADL's : Needs  assistance/impaired Eating/Feeding: Set up;Sitting   Grooming: Min guard;Wash/dry hands;Bed level   Upper Body Bathing: Bed level;Minimal assistance   Lower Body Bathing: Maximal assistance;Bed level   Upper Body Dressing : Minimal assistance;Bed level   Lower Body Dressing: Maximal assistance;Bed level   Toilet Transfer: Maximal assistance;+2 for physical assistance   Toileting- Clothing Manipulation and Hygiene: Total assistance       Functional mobility during ADLs: Minimal assistance       Vision   Vision Assessment?: No apparent visual deficits     Perception Perception Perception Tested?: No   Praxis Praxis Praxis tested?: Not tested    Pertinent Vitals/Pain Pain Assessment Pain Assessment: Faces Pain Score: 6  Faces Pain Scale: Hurts even more Pain Location: back- chronic Pain Descriptors / Indicators: Grimacing, Guarding, Discomfort Pain Intervention(s): Limited activity within patient's tolerance, Monitored during session, Repositioned     Hand Dominance Right   Extremity/Trunk Assessment Upper Extremity Assessment Upper Extremity Assessment: Generalized weakness   Lower Extremity Assessment Lower Extremity Assessment: Defer to PT evaluation RLE Deficits / Details: increased edema, ankle ROM limited, grossly 2-/5; painful with contact guard/block at R knee and just distal to knee LLE Deficits / Details: s/p AKA, grossly weak   Cervical / Trunk Assessment Cervical / Trunk Assessment: Kyphotic   Communication Communication Communication: No difficulties   Cognition Arousal/Alertness: Awake/alert Behavior During Therapy: WFL for tasks assessed/performed Overall Cognitive Status: No family/caregiver present to determine baseline cognitive functioning                       Memory: Decreased short-term memory Following Commands: Follows one step commands consistently, Follows one step commands with increased time       General  Comments: difficulty describing transfer when working with rehab staff at [facility]     General Comments  VSS on RA    Exercises     Shoulder Henderson expects to be discharged to:: Skilled nursing facility   Available Help at Discharge: Family Type of Home: Mobile home Home Access: Stairs to enter Technical brewer of Steps: 6 Entrance Stairs-Rails: Right;Left Home Layout: One level     Bathroom Shower/Tub: Tub/shower unit         [place] - single point;Wheelchair - Engineer, manufacturing systems (2 wheels)   Additional Comments: Plan for SNF for continued rehab and nursing care; above home info included for full picture and taken from previous therapy notes; was indepenent and driving as of mid December 2023      Prior Functioning/Environment Prior Level of Function : Needs assist             Mobility Comments: Reports was transferring bed to wc with SNF staff ADLs Comments: Assist from SNF staff        OT Problem List: Decreased strength;Decreased range of motion;Decreased activity tolerance;Impaired balance (sitting and/or standing);Decreased coordination;Decreased cognition;Decreased safety awareness;Decreased knowledge of use of DME or AE;Decreased knowledge of precautions;Impaired UE functional use;Impaired sensation;Pain;Increased edema      OT Treatment/Interventions: Self-care/ADL training;Therapeutic exercise;Energy conservation;Neuromuscular education;DME and/or AE instruction;Manual therapy;Therapeutic activities;Cognitive remediation/compensation;Patient/family education;Balance training    OT Goals(Current goals can be found in the care plan section) Acute Rehab OT Goals Patient Stated Goal: none stated OT Goal Formulation: With patient Time For Goal Achievement: 10/19/22 Potential to Achieve Goals: Good ADL Goals Pt Will Perform Upper  Body Dressing: with min guard assist;sitting Pt Will Perform Lower Body  Dressing: with min guard assist;bed level;sitting/lateral leans Pt Will Transfer to Toilet: with mod assist;with +2 assist;squat pivot transfer;stand pivot transfer;bedside commode Additional ADL Goal #1: pt will be min A for bed mobility and tolerate sitting EOB x5 min for ADLs  OT Frequency: Min 2X/week    Co-evaluation   Reason for Co-Treatment: For patient/therapist safety;To address functional/ADL transfers PT goals addressed during session: Mobility/safety with mobility;Balance;Strengthening/ROM OT goals addressed during session: ADL's and self-care      AM-PAC OT "6 Clicks" Daily Activity     Outcome Measure Help from another person eating meals?: A Little Help from another person taking care of personal grooming?: A Little Help from another person toileting, which includes using toliet, bedpan, or urinal?: Total Help from another person bathing (including washing, rinsing, drying)?: A Lot Help from another person to put on and taking off regular upper body clothing?: A Little Help from another person to put on and taking off regular lower body clothing?: Total 6 Click Score: 13   End of Session Nurse Communication: Mobility status  Activity Tolerance: Patient tolerated treatment well Patient left: in bed;with call bell/phone within reach;with bed alarm set;with nursing/sitter in room  OT Visit Diagnosis: Unsteadiness on feet (R26.81);Other abnormalities of gait and mobility (R26.89);Muscle weakness (generalized) (M62.81);Pain Pain - Right/Left: Left                Time: [id number] OT Time Calculation (min): 16 min Charges:  OT General Charges $OT Visit: 1 Visit OT Evaluation $OT Eval Moderate Complexity: 1 Mod  Heather [name], OTD, OTR/L SecureChat Preferred Acute Rehab [phone]  Ahmod Gillespie [name] Koonce 10/05/2022, 10:08 AM

## 2022-10-05 NOTE — Progress Notes (Signed)
Pharmacy Antibiotic Note  Heather [name] is a 72 y.o. female admitted on 10/01/2022 with wound infection.  Pharmacy has been consulted for Zosyn dosing.   Plan: Continue Zosyn 3.375g IV q8h (4 hour infusion). Monitor signs of improvement, WBC, renal function     Temp (24hrs), Avg:98.7 F (37.1 C), Min:98.2 F (36.8 C), Max:99.3 F (37.4 C)  Recent Labs  Lab 10/01/22 1208 10/01/22 1313 10/01/22 1445 10/02/22 0340  WBC  --   --  6.6 6.2  CREATININE 0.67  --   --  0.51  LATICACIDVEN 1.0 0.7  --   --     Estimated Creatinine Clearance: 51.9 mL/min (by C-G formula based on SCr of 0.51 mg/dL).    Allergies  Allergen Reactions   Iodinated Contrast Media Hives   Sulfa Antibiotics Hives   Shellfish Allergy Nausea And Vomiting and Rash    Scallops specifically     Antimicrobials this admission: Cefepime 2/7 >> 2/8 Metronidazole 2/7 >> 2/8 Zosyn 2/8 >> [3/5]  Dose adjustments this admission: None  Microbiology results: 2/7 BCx: NGTD x 3 days 2/4 MRSA PCR: Not detected  Thank you for allowing pharmacy to be a part of this patient's care.  Candelaria Stagers. Stewart Intern 10/05/2022 7:51 AM

## 2022-10-06 ENCOUNTER — Telehealth: Payer: Self-pay

## 2022-10-06 DIAGNOSIS — G9341 Metabolic encephalopathy: Secondary | ICD-10-CM | POA: Diagnosis not present

## 2022-10-06 DIAGNOSIS — G40309 Generalized idiopathic epilepsy and epileptic syndromes, not intractable, without status epilepticus: Secondary | ICD-10-CM

## 2022-10-06 DIAGNOSIS — R6251 Failure to thrive (child): Secondary | ICD-10-CM | POA: Diagnosis not present

## 2022-10-06 DIAGNOSIS — R4182 Altered mental status, unspecified: Secondary | ICD-10-CM | POA: Diagnosis not present

## 2022-10-06 LAB — GLUCOSE, CAPILLARY
Glucose-Capillary: 169 mg/dL — ABNORMAL HIGH (ref 70–99)
Glucose-Capillary: 279 mg/dL — ABNORMAL HIGH (ref 70–99)

## 2022-10-06 LAB — CULTURE, BLOOD (ROUTINE X 2)
Culture: NO GROWTH
Culture: NO GROWTH
Special Requests: ADEQUATE

## 2022-10-06 MED ORDER — LEVETIRACETAM 500 MG PO TABS: 500.0000 mg | ORAL_TABLET | Freq: Two times a day (BID) | ORAL | Status: DC

## 2022-10-06 NOTE — Progress Notes (Signed)
PHARMACY CONSULT NOTE FOR:  OUTPATIENT  PARENTERAL ANTIBIOTIC THERAPY (OPAT)  Informational only as patient is planned to discharge to SNF  Also of noting - patient discharged to SNF prior to pending of OPAT/abx orders. MD planned to verbal these to SNF  Indication: Infected groin wound/graft Regimen: Zosyn 3.375g IV every 6 hours (intermittent dosing infused over 30 minutes) End date: 10/28/22  IV antibiotic discharge orders are pended. To discharging provider:  please sign these orders via discharge navigator,  Select New Orders & click on the button choice - Manage This Unsigned Work.     Thank you for allowing pharmacy to be a part of this patient's care.  Alycia Rossetti, PharmD, BCPS Infectious Diseases Clinical Pharmacist 10/06/2022 4:10 PM   **Pharmacist phone directory can now be found on [email or web address] ([email or web address]).  Listed under [place].

## 2022-10-06 NOTE — Discharge Summary (Signed)
Physician Discharge Summary  Heather [name] [id number] DOB: 06-12-1951 DOA: 10/01/2022  PCP: [name], FNP  Admit date: 10/01/2022 Discharge date: 10/06/2022  Admitted From: SNF Disposition:  SNF  Recommendations for Outpatient Follow-up:  Follow up with PCP in 1-2 weeks Please obtain BMP/CBC in one week   Home Health:No Equipment/Devices:None  Discharge Condition:Stable CODE STATUS:Full Diet recommendation: Heart Healthy   Brief/Interim Summary: 72 y.o. female past medical history of diabetes mellitus type 2, chronic diastolic heart failure, COPD, essential hypertension aortic stenosis status post TAVR's tobacco use and schizophrenia comes in due to noncompliance with her medication, she is currently on antibiotics cefepime and Flagyl stop date 10/28/2022 per vascular surgery and ID in the emergency room was found to be afebrile tachypneic, CT of the head showed no acute abnormalities patient was given 2 L of lactate.   Discharge Diagnoses:  Principal Problem:   Acute metabolic encephalopathy Active Problems:   Right groin wound   Diabetes mellitus (HCC)   Hypertension, benign   Decubitus ulcer of sacral region, stage 2 (HCC)   Normocytic anemia  Acute metabolic encephalopathy likely due to nonconvulsive status versus pharmacology (cefepime): Neurology was consulted recommended a 24-hour EEG her cefepime was discontinued and she was placed on IV Zosyn. 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 She was started on Keppra twice a day. She will continue this in outpatient. Physical therapy evaluated patient recommended skilled nursing facility.  Right groin wound: Patient had a history of bilateral groin wound infection she was previously on cefepime, she was transition to IV Zosyn (due to her acute encephalopathy) her end date is 10/28/2022. She remained afebrile with no  leukocytosis.  Normocytic anemia: Hemoglobin at baseline.  Central hypertension: No change made to her medication.  Uncontrolled diabetes mellitus type 2: With an A1c of 13, she was restarted on long-acting insulin based on sliding scale insulin her blood glucose was well-controlled no changes made to her regimen.  Sacral decubitus ulcer present on admission stage II: Continue instructions for wound care  Discharge Instructions  Discharge Instructions     Diet - low sodium heart healthy   Complete by: As directed    Discharge wound care:   Complete by: As directed    Per wound care instructions   Increase activity slowly   Complete by: As directed       Allergies as of 10/06/2022       Reactions   Iodinated Contrast Media Hives   Sulfa Antibiotics Hives   Shellfish Allergy Nausea And Vomiting, Rash   Scallops specifically         Medication List     STOP taking these medications    ceFEPime  IVPB Commonly known as: MAXIPIME       TAKE these medications    acetaminophen 325 MG tablet Commonly known as: TYLENOL Take 2 tablets (650 mg total) by mouth every 6 (six) hours as needed for mild pain (or Fever >/= 101).   albuterol 108 (90 Base) MCG/ACT inhaler Commonly known as: VENTOLIN HFA Inhale 2 puffs into the lungs every 4 (four) hours as needed for wheezing or shortness of breath.   amLODipine 10 MG tablet Commonly known as: NORVASC Take 1 tablet (10 mg total) by mouth daily.   atorvastatin 80 MG tablet Commonly known as: LIPITOR Take 80 mg by mouth at bedtime.   carvedilol 6.25 MG tablet Commonly known as: COREG Take 1 tablet (6.25  mg total) by mouth 2 (two) times daily with a meal.   cetirizine 10 MG tablet Commonly known as: ZYRTEC Take 10 mg by mouth daily.   cholecalciferol 25 MCG (1000 UNIT) tablet Commonly known as: VITAMIN D3 Take 1,000 Units by mouth daily.   clopidogrel 75 MG tablet Commonly known as: PLAVIX Take 1 tablet (75 mg  total) by mouth daily.   docusate sodium 100 MG capsule Commonly known as: Colace Take 1 capsule (100 mg total) by mouth daily.   dronabinol 2.5 MG capsule Commonly known as: MARINOL Take 1 capsule (2.5 mg total) by mouth 2 (two) times daily before lunch and supper.   escitalopram 10 MG tablet Commonly known as: LEXAPRO Take 1 tablet (10 mg total) by mouth daily.   fluticasone 50 MCG/ACT nasal spray Commonly known as: FLONASE Place 1 spray into both nostrils daily as needed for allergies.   fluticasone-salmeterol 250-50 MCG/ACT Aepb Commonly known as: ADVAIR Inhale 1 puff into the lungs daily.   heparin lock flush 100 UNIT/ML Soln injection Inject 10 Units into the vein 3 (three) times daily. Use SASH method with IV ABT   hydrALAZINE 50 MG tablet Commonly known as: APRESOLINE Take 1 tablet (50 mg total) by mouth 3 (three) times daily.   insulin aspart 100 UNIT/ML injection Commonly known as: novoLOG Inject 3 Units into the skin 3 (three) times daily with meals. Hold for NPO or consuming less than 50% of meals What changed:  when to take this additional instructions   insulin aspart 100 UNIT/ML FlexPen Commonly known as: NOVOLOG Before each meal 3 times a day, 140-199 - 2 units, 200-250 - 4 units, 251-299 - 8 units,  300-349 - 10 units,  350 or above 12 units. I What changed: Another medication with the same name was changed. Make sure you understand how and when to take each.   insulin glargine 100 UNIT/ML injection Commonly known as: LANTUS Inject 0.1 mLs (10 Units total) into the skin 2 (two) times daily.   levETIRAcetam 500 MG tablet Commonly known as: KEPPRA Take 1 tablet (500 mg total) by mouth 2 (two) times daily.   metroNIDAZOLE 500 MG tablet Commonly known as: FLAGYL Take 1 tablet (500 mg total) by mouth every 12 (twelve) hours.   mirtazapine 7.5 MG tablet Commonly known as: REMERON Take 1 tablet (7.5 mg total) by mouth at bedtime.   multivitamin with  minerals tablet Take 1 tablet by mouth daily.   pantoprazole 40 MG tablet Commonly known as: PROTONIX Take 1 tablet (40 mg total) by mouth daily.   polyethylene glycol 17 g packet Commonly known as: MIRALAX / GLYCOLAX Take 17 g by mouth daily. Titrate as needed for constipation. What changed: additional instructions   senna-docusate 8.6-50 MG tablet Commonly known as: Senokot-S Take 1 tablet by mouth daily.   sodium chloride flush 0.9 % Soln injection Inject 10 mLs into the vein 3 (three) times daily. Use SASH method with IV ABT   traMADol 50 MG tablet Commonly known as: ULTRAM Take 1 tablet (50 mg total) by mouth every 8 (eight) hours as needed for moderate pain or severe pain.   traZODone 50 MG tablet Commonly known as: DESYREL Take 0.5 tablets (25 mg total) by mouth at bedtime as needed for sleep.               Discharge Care Instructions  (From admission, onward)           Start  Ordered   10/06/22 0000  Discharge wound care:       Comments: Per wound care instructions   10/06/22 0730            Allergies  Allergen Reactions   Iodinated Contrast Media Hives   Sulfa Antibiotics Hives   Shellfish Allergy Nausea And Vomiting and Rash    Scallops specifically     Consultations: Neurology   Procedures/Studies: Overnight EEG with video  Result Date: 10/03/2022 Lora Havens, MD     10/03/2022 10:43 PM Patient Name: Selia Mcelmurry MRN: [id number] Epilepsy Attending: Lora Havens Referring Physician/Provider: Amie Portland, MD Duration: 10/02/2022 1816 to 10/03/2022 1816  Patient history: 72yo F with ams. EEG to evaluate for seizure  Level of alertness: Awake  AEDs during EEG study: None  Technical aspects: This EEG study was done with scalp electrodes positioned according to the 10-20 International system of electrode placement. Electrical activity was reviewed with band pass filter of 1-70Hz$ , sensitivity of 7 uV/mm, display speed of 43m/sec with a  60Hz$  notched filter applied as appropriate. EEG data were recorded continuously and digitally stored.  Video monitoring was available and reviewed as appropriate.  Description: EEG initially showed continuous generalized 3 to 6 Hz theta-delta slowing. Generalized periodic discharges with triphasic morphology were noted with fluctuating frequency of 1.5 to 3hz$  Hz. IV ativan 229mwas administered at 1829 on 10/02/2022. After 1831, EEG improved significantly. 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 Subsequently EEG showed posterior dominant rhythm of 7Hz$  activity of moderate voltage (25-35 uV) seen predominantly in posterior head regions, symmetric and reactive to eye opening and eye closing. EEG also showed intermittent generalized 3-7hz$  theta-delta slowing. Hyperventilation and photic stimulation were not performed.    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 IV ativan 65m39mas administered at [place] 2/8/202 after which status epilepticus resolved. . Subsequently EEG was suggestive of moderate diffuse encephalopathy, nonspecific etiology.   PriLora HavensEEG adult  Result Date: 10/02/2022 YadLora HavensD     10/02/2022  7:50 PM Patient Name: RegShadaya GrableN: [id number] Attending: PriLora Havensferring Physician/Provider: AroAmie PortlandD Date: 10/02/2022 Duration: 27.03 mins Patient history: [id number] ams. EEG to evaluate for seizure Level of alertness: Awake AEDs during EEG study: None Technical aspects: This EEG study was done with scalp electrodes positioned according to the 10-20 International system of electrode placement. Electrical activity was reviewed with band pass filter of 1-70Hz$ , sensitivity of 7 uV/mm, display speed of 29m75mc with a 60Hz$  notched filter applied as appropriate. EEG data were recorded continuously  and digitally stored.  Video monitoring was available and reviewed as appropriate. Description: EEG showed continuous generalized 3 to 6 Hz theta-delta slowing. Generalized periodic discharges with triphasic morphology were noted with fluctuating frequency of 1.5 to 3hz$  Hz. Hyperventilation and photic stimulation were not performed.   ABNORMALITY - Periodic discharges with triphasic morphology, generalized ( GPDs) - Continuous slow, generalized IMPRESSION: This study showed generalized periodic discharges with triphasic morphology with fluctuating frequency of 1.5 to 3hz$  Hz. This eeg pattern is on the ictal-interictal continuum with high suspicion for ictal nature due to frequency of 3hz$ . Recommend ativan challenga and long term monitoring. Additionally there is moderate to severe diffuse encephalopathy, nonspecific etiology Dr. ArorRory Percy notified. PriyLora HavensT Head Wo Contrast  Result Date: 10/01/2022 CLINICAL DATA:  Mental  status change, unknown cause EXAM: CT HEAD WITHOUT CONTRAST TECHNIQUE: Contiguous axial images were obtained from the base of the skull through the vertex without intravenous contrast. RADIATION DOSE REDUCTION: This exam was performed according to the departmental dose-optimization program which includes automated exposure control, adjustment of the mA and/or kV according to patient size and/or use of iterative reconstruction technique. COMPARISON:  CT examination dated September 27, 2022 FINDINGS: Brain: No evidence of acute infarction, hemorrhage, hydrocephalus, extra-axial collection or mass lesion/mass effect. Cerebral atrophy and chronic microvascular ischemic changes of the white matter, unchanged. Vascular: No hyperdense vessel or unexpected calcification. Skull: Normal. Negative for fracture or focal lesion. Sinuses/Orbits: No acute finding. Other: None. IMPRESSION: 1. No acute intracranial abnormality. 2. Cerebral atrophy and chronic microvascular ischemic changes of the white  matter, unchanged. Electronically Signed   By: Keane Police D.O.   On: 10/01/2022 12:50   DG Chest Port 1 View  Result Date: 10/01/2022 CLINICAL DATA:  Sepsis. EXAM: PORTABLE CHEST 1 VIEW COMPARISON:  09/27/2022 FINDINGS: The cardiac silhouette, mediastinal and hilar contours are normal. Stable aortic calcifications. 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 Left coronary artery stent is noted. Left PICC line tip is in good position in the distal SVC. The lungs are clear of an acute process. No infiltrates or effusions. The bony thorax is intact. IMPRESSION: No acute cardiopulmonary findings. Electronically Signed   By: Marijo Sanes M.D.   On: 10/01/2022 12:34   EEG adult  Result Date: 09/28/2022 Lora Havens, MD     09/28/2022  8:37 AM Patient Name: Sophy Berndt MRN: [id number] Epilepsy Attending: Lora Havens Referring Physician/Provider: Lorenza Chick, MD Date: 09/28/2022 Duration: 29.41 mins Patient history: 72yo M with ams. EEG to evaluate for seizure. Level of alertness: Awake, asleep AEDs during EEG study: None Technical aspects: This EEG study was done with scalp electrodes positioned according to the 10-20 International system of electrode placement. Electrical activity was reviewed with band pass filter of 1-70Hz$ , sensitivity of 7 uV/mm, display speed of 8m/sec with a 60Hz$  notched filter applied as appropriate. EEG data were recorded continuously and digitally stored.  Video monitoring was available and reviewed as appropriate. Description: The posterior dominant rhythm consists of 7.5 Hz activity of moderate voltage (25-35 uV) seen predominantly in posterior head regions, symmetric and reactive to eye opening and eye closing. Sleep was characterized by vertex waves, sleep spindles (12 to 14 Hz), maximal frontocentral region. EEG showed intermittent generalized polymorphic sharply contoured 3 to 6 Hz theta-delta slowing, at times with triphasic morphology . Hyperventilation and photic stimulation  were not performed.   ABNORMALITY - Intermittent slow, generalized IMPRESSION: This study is suggestive of mild to moderate diffuse encephalopathy, nonspecific etiology but could be related to toxic-metabolic causes. No seizures or definite epileptiform discharges were seen throughout the recording. PLora Havens  MR BRAIN WO CONTRAST  Result Date: 09/27/2022 CLINICAL DATA:  Altered mental status EXAM: MRI HEAD WITHOUT CONTRAST TECHNIQUE: Multiplanar, multiecho pulse sequences of the brain and surrounding structures were obtained without intravenous contrast. COMPARISON:  08/04/2022 FINDINGS: Brain: No acute infarct, mass effect or extra-axial collection. Old blood products at the midline posterior fossa. There is multifocal hyperintense T2-weighted signal within the white matter. Generalized volume loss. The midline structures are normal. 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 Vascular: Normal flow voids. Skull and upper cervical spine: Normal marrow signal. Sinuses/Orbits: Bilateral mastoid fluid. Paranasal sinuses are clear. Bilateral ocular lens replacements. Other: None IMPRESSION: 1. No  acute intracranial abnormality. 2. 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 Electronically Signed   By: Ulyses Jarred M.D.   On: 09/27/2022 23:32   DG Chest 2 View  Result Date: 09/27/2022 CLINICAL DATA:  Altered level of consciousness EXAM: CHEST - 2 VIEW COMPARISON:  09/18/2022 FINDINGS: Frontal and lateral views of the chest demonstrates stable aortic valve prosthesis. Left-sided PICC, tip in the region of the superior vena cava. The cardiac silhouette is unremarkable. No airspace disease, effusion, or pneumothorax. No acute bony abnormalities. IMPRESSION: 1. No acute intrathoracic process. Electronically Signed   By: Randa Ngo M.D.   On: 09/27/2022 17:46   CT Head Wo Contrast  Result Date: 09/27/2022 CLINICAL DATA:  Mental status change. EXAM: CT  HEAD WITHOUT CONTRAST TECHNIQUE: Contiguous axial images were obtained from the base of the skull through the vertex without intravenous contrast. RADIATION DOSE REDUCTION: This exam was performed according to the departmental dose-optimization program which includes automated exposure control, adjustment of the mA and/or kV according to patient size and/or use of iterative reconstruction technique. COMPARISON:  August 16, 2022 FINDINGS: Brain: No evidence of acute hemorrhage, hydrocephalus, extra-axial collection or mass lesion/mass effect. Moderate brain parenchymal volume loss and deep white matter microangiopathy. Small areas of hypoattenuation in the right cerebellum with uncertain significance. Vascular: No hyperdense vessel or unexpected calcification. Skull: Normal. Negative for fracture or focal lesion. Sinuses/Orbits: Mucosal thickening of the right maxillary sinus. Other: None. IMPRESSION: No evidence of acute hemorrhage. Small areas of hypoattenuation in the right cerebellum with uncertain significance. These may represent age-indeterminate infarcts. Moderate brain parenchymal volume loss and deep white matter microangiopathy. Electronically Signed   By: Fidela Salisbury M.D.   On: 09/27/2022 17:44   [place] EKG SITE RITE  Result Date: 09/19/2022 If Site Rite image not attached, placement could not be confirmed due to current cardiac rhythm.  DG CHEST PORT 1 VIEW  Result Date: 09/18/2022 CLINICAL DATA:  COVID EXAM: PORTABLE CHEST 1 VIEW COMPARISON:  None Available. FINDINGS: The heart size and mediastinal contours are within normal limits. Patient is status post TAVR, unchanged. Both lungs are clear. The visualized skeletal structures are unremarkable. IMPRESSION: No active disease. Electronically Signed   By: Ronney Asters M.D.   On: 09/18/2022 15:34     Subjective: No complaints  Discharge Exam: Vitals:   10/05/22 1938 10/06/22 0454  BP: (!) 115/57 (!) 146/69  Pulse: 91 84  Resp: 18  16  Temp: 97.9 F (36.6 C) 98.5 F (36.9 C)  SpO2: 98% 97%   Vitals:   10/05/22 1636 10/05/22 1856 10/05/22 1938 10/06/22 0454  BP: 107/66 123/64 (!) 115/57 (!) 146/69  Pulse: 93 87 91 84  Resp: 17 19 18 16  $ Temp: 98.6 F (37 C)  97.9 F (36.6 C) 98.5 F (36.9 C)  TempSrc: Oral  Oral Oral  SpO2: 99% 98% 98% 97%    General: Pt is alert, awake, not in acute distress Cardiovascular: RRR, S1/S2 +, no rubs, no gallops Respiratory: CTA bilaterally, no wheezing, no rhonchi Abdominal: Soft, NT, ND, bowel sounds + Extremities: no edema, no cyanosis    The results of significant diagnostics from this hospitalization (including imaging, microbiology, ancillary and laboratory) are listed below for reference.     Microbiology: Recent Results (from the past 240 hour(s))  MRSA Next Gen by PCR, Nasal     Status: None   Collection Time: 09/28/22  5:18 AM   Specimen: Wound; Nasal Swab  Result Value  Ref Range Status   MRSA by PCR Next Gen NOT DETECTED NOT DETECTED Final    Comment: (NOTE) The GeneXpert MRSA Assay (FDA approved for NASAL specimens only), is one component of a comprehensive MRSA colonization surveillance program. It is not intended to diagnose MRSA infection nor to guide or monitor treatment for MRSA infections. Test performance is not FDA approved in patients less than 20 years old. Performed at [facility], [place] [address], [place], [place] [address]   Blood Culture (routine x 2)     Status: None (Preliminary result)   Collection Time: 10/01/22  1:00 PM   Specimen: BLOOD  Result Value Ref Range Status   Specimen Description BLOOD RIGHT ANTECUBITAL  Final   Special Requests   Final    BOTTLES DRAWN AEROBIC AND ANAEROBIC Blood Culture results may not be optimal due to an excessive volume of blood received in culture bottles   Culture   Final    NO GROWTH 4 DAYS Performed at [facility], [place] [address], [place], [place] [address]    Report Status  PENDING  Incomplete  Blood Culture (routine x 2)     Status: None (Preliminary result)   Collection Time: 10/01/22  1:13 PM   Specimen: BLOOD LEFT FOREARM  Result Value Ref Range Status   Specimen Description BLOOD LEFT FOREARM  Final   Special Requests   Final    BOTTLES DRAWN AEROBIC AND ANAEROBIC Blood Culture adequate volume   Culture   Final    NO GROWTH 4 DAYS Performed at [facility], [place] [address] [place], [place] [address]    Report Status PENDING  Incomplete     Labs: BNP (last 3 results) Recent Labs    08/16/22 0434  BNP [id number]*   Basic Metabolic Panel: Recent Labs  Lab 10/01/22 1208 10/02/22 0340  NA 134* 135  K 3.8 3.5  CL 100 100  CO2 24 26  GLUCOSE 177* 120*  BUN 14 10  CREATININE 0.67 0.51  CALCIUM 9.3 9.3   Liver Function Tests: Recent Labs  Lab 10/01/22 1208  AST 16  ALT 9  ALKPHOS 62  BILITOT <0.1*  PROT 6.3*  ALBUMIN 2.6*   No results for input(s): "LIPASE", "AMYLASE" in the last 168 hours. Recent Labs  Lab 10/01/22 1208  AMMONIA 17   CBC: Recent Labs  Lab 10/01/22 1445 10/02/22 0340  WBC 6.6 6.2  NEUTROABS 4.4  --   HGB 11.2* 10.6*  HCT 33.5* 33.2*  MCV 92.5 91.2  PLT 434* 442*   Cardiac Enzymes: No results for input(s): "CKTOTAL", "CKMB", "CKMBINDEX", "TROPONINI" in the last 168 hours. BNP: Invalid input(s): "POCBNP" CBG: Recent Labs  Lab 10/04/22 2003 10/05/22 0900 10/05/22 1237 10/05/22 1645 10/05/22 1941  GLUCAP 156* 247* 201* 225* 192*   D-Dimer No results for input(s): "DDIMER" in the last 72 hours. Hgb A1c No results for input(s): "HGBA1C" in the last 72 hours. Lipid Profile No results for input(s): "CHOL", "HDL", "LDLCALC", "TRIG", "CHOLHDL", "LDLDIRECT" in the last 72 hours. Thyroid function studies No results for input(s): "TSH", "T4TOTAL", "T3FREE", "THYROIDAB" in the last 72 hours.  Invalid input(s): "FREET3" Anemia work up No results for input(s): "VITAMINB12", "FOLATE", "FERRITIN",  "TIBC", "IRON", "RETICCTPCT" in the last 72 hours. Urinalysis    Component Value Date/Time   COLORURINE STRAW (A) 10/01/2022 1849   APPEARANCEUR CLEAR 10/01/2022 1849   APPEARANCEUR Clear 06/20/2012 2127   LABSPEC 1.010 10/01/2022 1849   LABSPEC  1.022 06/20/2012 2127   PHURINE 5.0 10/01/2022 1849   GLUCOSEU NEGATIVE 10/01/2022 1849   GLUCOSEU >=500 06/20/2012 2127   HGBUR SMALL (A) 10/01/2022 1849   BILIRUBINUR NEGATIVE 10/01/2022 1849   BILIRUBINUR Negative 06/20/2012 2127   [place] NEGATIVE 10/01/2022 1849   PROTEINUR 30 (A) 10/01/2022 1849   NITRITE NEGATIVE 10/01/2022 1849   LEUKOCYTESUR NEGATIVE 10/01/2022 1849   LEUKOCYTESUR Negative 06/20/2012 2127   Sepsis Labs Recent Labs  Lab 10/01/22 1445 10/02/22 0340  WBC 6.6 6.2   Microbiology Recent Results (from the past 240 hour(s))  MRSA Next Gen by PCR, Nasal     Status: None   Collection Time: 09/28/22  5:18 AM   Specimen: Wound; Nasal Swab  Result Value Ref Range Status   MRSA by PCR Next Gen NOT DETECTED NOT DETECTED Final    Comment: (NOTE) The GeneXpert MRSA Assay (FDA approved for NASAL specimens only), is one component of a comprehensive MRSA colonization surveillance program. It is not intended to diagnose MRSA infection nor to guide or monitor treatment for MRSA infections. Test performance is not FDA approved in patients less than 9 years old. Performed at [facility], [place] [address], [place], [place] [address]   Blood Culture (routine x 2)     Status: None (Preliminary result)   Collection Time: 10/01/22  1:00 PM   Specimen: BLOOD  Result Value Ref Range Status   Specimen Description BLOOD RIGHT ANTECUBITAL  Final   Special Requests   Final    BOTTLES DRAWN AEROBIC AND ANAEROBIC Blood Culture results may not be optimal due to an excessive volume of blood received in culture bottles   Culture   Final    NO GROWTH 4 DAYS Performed at [facility], [place] [address] [place], [place]  [address]    Report Status PENDING  Incomplete  Blood Culture (routine x 2)     Status: None (Preliminary result)   Collection Time: 10/01/22  1:13 PM   Specimen: BLOOD LEFT FOREARM  Result Value Ref Range Status   Specimen Description BLOOD LEFT FOREARM  Final   Special Requests   Final    BOTTLES DRAWN AEROBIC AND ANAEROBIC Blood Culture adequate volume   Culture   Final    NO GROWTH 4 DAYS Performed at [facility], [place] [address], [place], [place] [address]    Report Status PENDING  Incomplete     SIGNED:   Charlynne Cousins, MD  [facility] 10/06/2022, 7:32 AM Pager   If 7PM-7AM, please contact night-coverage [email or web address] Password [email or web address]

## 2022-10-06 NOTE — Telephone Encounter (Signed)
Heather [name] with [facility] called requesting clarification on wound vac orders.  Reviewed pt's chart, returned call for clarification, no answer, no vm.

## 2022-10-06 NOTE — TOC Transition Note (Signed)
Transition of Care [facility]) - CM/SW Discharge Note   Patient Details  Name: Heather [name] MRN: [id number] Date of Birth: 08-29-1950  Transition of Care [facility]) CM/SW Contact:  Coralee Pesa, [place] Phone Number: 10/06/2022, 11:44 AM   Clinical Narrative:    Pt to be transported to [facility] via [facility]. Nurse to call report to [phone].   Final next level of care: Skilled Nursing Facility Barriers to Discharge: Barriers Resolved   Patient Goals and CMS Choice CMS Medicare.gov Compare Post Acute Care list provided to:: Patient Represenative (must comment) Choice offered to / list presented to : Adult Children  Discharge Placement                Patient chooses bed at: [facility] Patient to be transferred to facility by: [facility] Name of family member notified: Daughter Patient and family notified of of transfer: 10/06/22  Discharge Plan and Services Additional resources added to the After Visit Summary for       Post Acute Care Choice: [place]                               Social Determinants of Health (SDOH) Interventions SDOH Screenings   Food Insecurity: No Food Insecurity (09/28/2022)  Recent Concern: [place] Present (08/11/2022)  Housing: Low Risk  (09/28/2022)  Transportation Needs: No Transportation Needs (09/28/2022)  Utilities: Not At Risk (09/28/2022)  Financial Resource Strain: Medium Risk (10/19/2018)  Physical Activity: Inactive (10/19/2018)  Social Connections: Unknown (10/19/2018)  Tobacco Use: High Risk (09/28/2022)     Readmission Risk Interventions    11/13/2020    2:27 PM  Readmission Risk Prevention Plan  Transportation Screening Complete  PCP or Specialist Appt within 3-5 Days Complete  HRI or [place] Complete  Social Work Consult for [place] Planning/Counseling Complete  Palliative Care Screening Not Applicable  Medication Review Press photographer) Complete

## 2022-10-06 NOTE — Care Management Important Message (Signed)
Important Message  Patient Details  Name: Heather [name] MRN: [id number] Date of Birth: Jun 23, 1951   Medicare Important Message Given:   Mail IM letter pt. D/C.    Holli Humbles Smith 10/06/2022, 3:50 PM

## 2022-10-06 NOTE — Progress Notes (Signed)
[facility] PROGRESS NOTE    Progress Note  Heather [name]  [id number] DOB: 1950-12-22 DOA: 10/01/2022 PCP: [name], FNP     Brief Narrative:   Heather [name] is an 72 y.o. female past medical history of diabetes mellitus type 2, chronic diastolic heart failure, COPD, essential hypertension aortic stenosis status post TAVR's tobacco use and schizophrenia comes in due to noncompliance with her medication, she is currently on antibiotics cefepime and Flagyl stop date 10/28/2022 per vascular surgery and ID in the emergency room was found to be afebrile tachypneic, CT of the head showed no acute abnormalities patient was given 2 L of lactate.  Assessment/Plan:   Acute metabolic encephalopathy: Neurology was consulted recommended change antibiotics she is now on Zosyn. 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 PT evaluated the patient recommended skilled nursing facility Awaiting skilled nursing facility placement.  Right groin wound: Patient infected bilateral groin wound she is currently on Zosyn with end date of 10/28/2022, she has remained afebrile with no leukocytosis. Her antibiotics were transitioned to Zosyn as it was believed that it was contributing to her encephalopathy.  Normocytic anemia: Hemoglobin appears to be at baseline.  Essential hypertension: She was restarted back on her Coreg hydralazine amlodipine her blood pressure is stabilized.  Uncontrolled diabetes mellitus type 2: With a last A1c of 13. Restart long-acting insulin plus sliding scale with CBGs before meals and at bedtime.  Sacral decubitus ulcer present on admission: Wound care has been consulted.  RN Pressure Injury Documentation: Pressure Injury 10/02/22 Ischial tuberosity Left Stage 2 -  Partial thickness loss of dermis presenting as a shallow  open injury with a red, pink wound bed without slough. 1 cm x 2 cm x 0.1 cm (Active)  10/02/22 0800  Location: Ischial tuberosity  Location Orientation: Left  Staging: Stage 2 -  Partial thickness loss of dermis presenting as a shallow open injury with a red, pink wound bed without slough.  Wound Description (Comments): 1 cm x 2 cm x 0.1 cm  Present on Admission: Yes  Dressing Type Foam - Lift dressing to assess site every shift 10/05/22 2135    DVT prophylaxis: lovenox Family Communication:none Status is: Observation The patient remains OBS appropriate and will d/c before 2 midnights.    Code Status:     Code Status Orders  (From admission, onward)           Start     Ordered   10/01/22 1915  Full code  Continuous       Question:  By:  Answer:  Consent: discussion documented in EHR   10/01/22 1915           Code Status History     Date Active Date Inactive Code Status Order ID Comments User Context   09/28/2022 0101 09/30/2022 2041 Full Code [id number]  [name], DO ED   08/04/2022 0103 09/22/2022 2128 Full Code [id number]  [name], MD Inpatient   03/25/2022 1112 03/25/2022 1914 Full Code [id number]  [name], MD Inpatient   05/28/2021 1207 05/28/2021 1916 Full Code [id number]  [name], MD Inpatient   04/12/2021 1644 04/13/2021 0235 Full Code [id number]  [name], MD Inpatient   11/09/2020 2351 11/15/2020 1556 Full Code [id number]  [name], DO ED         IV Access:  Peripheral IV   Procedures and diagnostic studies:   No results found.   Medical Consultants:   None.   Subjective:    Heather [name] patient relates she feels significantly better, she wants to go home but she understands she needs rehab first she is willing to do rehab  Objective:    Vitals:   10/05/22 1636 10/05/22 1856 10/05/22 1938 10/06/22 0454  BP: 107/66 123/64 (!) 115/57 (!) 146/69  Pulse: 93 87 91 84  Resp: 17 19 18 16   $ Temp: 98.6 F (37 C)  97.9 F (36.6 C) 98.5 F (36.9 C)  TempSrc: Oral  Oral Oral  SpO2: 99% 98% 98% 97%   SpO2: 97 %   Intake/Output Summary (Last 24 hours) at 10/06/2022 0721 Last data filed at 10/06/2022 0454 Gross per 24 hour  Intake 115.8 ml  Output 1300 ml  Net -1184.2 ml    There were no vitals filed for this visit.  Exam: General exam: In no acute distress. Respiratory system: Good air movement and clear to auscultation. Cardiovascular system: S1 & S2 heard, RRR. No JVD. Gastrointestinal system: Abdomen is nondistended, soft and nontender.  Extremities: No pedal edema. Skin: No rashes, lesions or ulcers Psychiatry: Judgement and insight appear normal. Mood & affect appropriate.  Data Reviewed:    Labs: Basic Metabolic Panel: Recent Labs  Lab 10/01/22 1208 10/02/22 0340  NA 134* 135  K 3.8 3.5  CL 100 100  CO2 24 26  GLUCOSE 177* 120*  BUN 14 10  CREATININE 0.67 0.51  CALCIUM 9.3 9.3    GFR Estimated Creatinine Clearance: 51.9 mL/min (by C-G formula based on SCr of 0.51 mg/dL). Liver Function Tests: Recent Labs  Lab 10/01/22 1208  AST 16  ALT 9  ALKPHOS 62  BILITOT <0.1*  PROT 6.3*  ALBUMIN 2.6*    No results for input(s): "LIPASE", "AMYLASE" in the last 168 hours. Recent Labs  Lab 10/01/22 1208  AMMONIA 17    Coagulation profile Recent Labs  Lab 10/01/22 1313 10/01/22 1445  INR 1.0 1.1    COVID-19 Labs  No results for input(s): "DDIMER", "FERRITIN", "LDH", "CRP" in the last 72 hours.  Lab Results  Component Value Date   SARSCOV2NAA POSITIVE (A) 09/18/2022   SARSCOV2NAA NEGATIVE 08/03/2022   [place] NEGATIVE 11/10/2020    CBC: Recent Labs  Lab 10/01/22 1445 10/02/22 0340  WBC 6.6 6.2  NEUTROABS 4.4  --   HGB 11.2* 10.6*  HCT 33.5* 33.2*  MCV 92.5 91.2  PLT 434* 442*    Cardiac Enzymes: No results for input(s): "CKTOTAL", "CKMB", "CKMBINDEX", "TROPONINI" in the last 168 hours. BNP (last 3 results) No  results for input(s): "PROBNP" in the last 8760 hours. CBG: Recent Labs  Lab 10/04/22 2003 10/05/22 0900 10/05/22 1237 10/05/22 1645 10/05/22 1941  GLUCAP 156* 247* 201* 225* 192*    D-Dimer: No results for input(s): "DDIMER" in the last 72 hours. Hgb A1c: No results for input(s): "HGBA1C" in the last 72 hours. Lipid Profile: No results for input(s): "CHOL", "HDL", "LDLCALC", "TRIG", "CHOLHDL", "LDLDIRECT" in the last 72 hours. Thyroid function studies: No results for input(s): "TSH", "T4TOTAL", "T3FREE", "THYROIDAB" in the last 72 hours.  Invalid input(s): "FREET3"  Anemia work up: No results for input(s): "VITAMINB12", "FOLATE", "FERRITIN", "TIBC", "IRON", "RETICCTPCT" in the last 72 hours.  Sepsis Labs: Recent Labs  Lab 10/01/22 1208 10/01/22 1313 10/01/22 1445 10/02/22 0340  WBC  --   --  6.6 6.2  LATICACIDVEN 1.0 0.7  --   --  Microbiology Recent Results (from the past 240 hour(s))  MRSA Next Gen by PCR, Nasal     Status: None   Collection Time: 09/28/22  5:18 AM   Specimen: Wound; Nasal Swab  Result Value Ref Range Status   MRSA by PCR Next Gen NOT DETECTED NOT DETECTED Final    Comment: (NOTE) The GeneXpert MRSA Assay (FDA approved for NASAL specimens only), is one component of a comprehensive MRSA colonization surveillance program. It is not intended to diagnose MRSA infection nor to guide or monitor treatment for MRSA infections. Test performance is not FDA approved in patients less than 29 years old. Performed at [facility], [place] [address], [place], [place] [address]   Blood Culture (routine x 2)     Status: None (Preliminary result)   Collection Time: 10/01/22  1:00 PM   Specimen: BLOOD  Result Value Ref Range Status   Specimen Description BLOOD RIGHT ANTECUBITAL  Final   Special Requests   Final    BOTTLES DRAWN AEROBIC AND ANAEROBIC Blood Culture results may not be optimal due to an excessive volume of blood received in culture  bottles   Culture   Final    NO GROWTH 4 DAYS Performed at  [facility], [place] [address], [place], [place] [address]    Report Status PENDING  Incomplete  Blood Culture (routine x 2)     Status: None (Preliminary result)   Collection Time: 10/01/22  1:13 PM   Specimen: BLOOD LEFT FOREARM  Result Value Ref Range Status   Specimen Description BLOOD LEFT FOREARM  Final   Special Requests   Final    BOTTLES DRAWN AEROBIC AND ANAEROBIC Blood Culture adequate volume   Culture   Final    NO GROWTH 4 DAYS Performed at [facility], [place] [address], [place], [place] [address]    Report Status PENDING  Incomplete     Medications:    amLODipine  10 mg Oral Daily   atorvastatin  80 mg Oral QHS   carvedilol  6.25 mg Oral BID WC   Chlorhexidine Gluconate Cloth  6 each Topical Daily   clopidogrel  75 mg Oral Daily   docusate sodium  100 mg Oral Daily   dronabinol  2.5 mg Oral BID AC   enoxaparin (LOVENOX) injection  40 mg Subcutaneous Q24H   escitalopram  10 mg Oral Daily   hydrALAZINE  50 mg Oral Q8H   insulin aspart  0-15 Units Subcutaneous TID WC   insulin aspart  0-5 Units Subcutaneous QHS   insulin aspart  3 Units Subcutaneous TID WC   insulin glargine-yfgn  10 Units Subcutaneous BID   levETIRAcetam  500 mg Oral BID   mirtazapine  7.5 mg Oral QHS   pantoprazole  40 mg Oral Daily   sodium chloride flush  3 mL Intravenous Q12H   Continuous Infusions:  lactated ringers 10 mL/hr at 10/05/22 1539   piperacillin-tazobactam (ZOSYN)  IV 3.375 g (10/06/22 0543)      LOS: 4 days   Charlynne Cousins  [facility]  10/06/2022, 7:21 AM

## 2022-10-06 NOTE — Care Management Important Message (Signed)
Important Message  Patient Details  Name: Heather [name] MRN: [id number] Date of Birth: 06-Sep-1950   Medicare Important Message Given:  Yes  Patient left prior to IM delivery will mail to the patient home address.    Michah Minton 10/06/2022, 2:51 PM

## 2022-10-06 NOTE — Telephone Encounter (Signed)
LPN from SNF called to confirm OPAT orders. Patient has Picc line in place. Routing to MD and pharmacy to assist with orders to be faxed or called to [facility].  [place]) Fax [phone]  Eugenia Mcalpine, LPN

## 2022-10-06 NOTE — Consult Note (Signed)
[place] Nurse wound follow up Wound type:bilateral groin wounds, NPWT in place with Y connector Measurement:assessed 10/02/22, unchanged Wound [id number] red, 10% yellow fibrin Drainage (amount, consistency, odor) minimal serosanguinous  no odor Periwound:intact  creasing at inguinal fold,. Bilaterally.  Barrier ring to periwound to promote seal.  Purewick female urinary manager in place Dressing procedure/placement/frequency:Removed 1 piece white, 1 piece black foam from each wound bed.  Replaced 1 piece white and 1piece black foam in each.  Covered with drape, Y connector in place.  Seal achieved at 125 mmHg. Change Mon.Wed.Fri.  Will follow.   Estrellita Ludwig MSN, RN, FNP-BC CWON Wound, Ostomy, Continence Nurse [facility] [phone] Pager [phone]

## 2022-10-06 NOTE — Progress Notes (Signed)
  Progress Note    10/06/2022 7:52 AM * No surgery found *  Subjective:  going back to SNF today. Denies pain    Vitals:   10/06/22 0454 10/06/22 0748  BP: (!) 146/69 (!) 176/67  Pulse: 84 81  Resp: 16 17  Temp: 98.5 F (36.9 C) 98.1 F (36.7 C)  SpO2: 97% 97%    Physical Exam: Lungs:  nonlabored Incisions:  bilateral groin wounds with wound vac with good seal Extremities:  L AKA well healed. RLE without tissue ischemia, palpable AT   CBC    Component Value Date/Time   WBC 6.2 10/02/2022 0340   RBC 3.64 (L) 10/02/2022 0340   HGB 10.6 (L) 10/02/2022 0340   HGB 14.3 06/20/2012 2127   HCT 33.2 (L) 10/02/2022 0340   HCT 42.9 06/20/2012 2127   PLT 442 (H) 10/02/2022 0340   PLT 357 06/20/2012 2127   MCV 91.2 10/02/2022 0340   MCV 91 06/20/2012 2127   MCH 29.1 10/02/2022 0340   MCHC 31.9 10/02/2022 0340   RDW 16.6 (H) 10/02/2022 0340   RDW 13.6 06/20/2012 2127   LYMPHSABS 1.1 10/01/2022 1445   MONOABS 0.7 10/01/2022 1445   EOSABS 0.3 10/01/2022 1445   BASOSABS 0.1 10/01/2022 1445    BMET    Component Value Date/Time   NA 135 10/02/2022 0340   NA 135 (L) 06/20/2012 2127   K 3.5 10/02/2022 0340   K 4.0 06/20/2012 2127   CL 100 10/02/2022 0340   CL 100 06/20/2012 2127   CO2 26 10/02/2022 0340   CO2 27 06/20/2012 2127   GLUCOSE 120 (H) 10/02/2022 0340   GLUCOSE 250 (H) 06/20/2012 2127   BUN 10 10/02/2022 0340   BUN 20 (H) 06/20/2012 2127   CREATININE 0.51 10/02/2022 0340   CREATININE 0.60 06/20/2012 2127   CALCIUM 9.3 10/02/2022 0340   CALCIUM 8.8 06/20/2012 2127   GFRNONAA >60 10/02/2022 0340   GFRNONAA >60 06/20/2012 2127   GFRAA >60 06/20/2012 2127    INR    Component Value Date/Time   INR 1.1 10/01/2022 1445     Intake/Output Summary (Last 24 hours) at 10/06/2022 0752 Last data filed at 10/06/2022 0454 Gross per 24 hour  Intake 115.8 ml  Output 1300 ml  Net -1184.2 ml      Assessment/Plan:  72 y.o. female s/p bilateral groin wound  vacs and L AKA   -RLE without signs of tissue ischemia, palpable AT -L AKA healing well -Bilateral groins with wound vacs with good seal. Patient is going back to SNF today. Please ensure WOC changes wound vacs dressings prior to discharge today. At SNF patient will require 3x weekly changes, on MWF -Will arrange follow up with our office in 2-3 weeks for wound check   Vicente Serene, PA-C [facility] [phone] 10/06/2022 7:52 AM

## 2022-10-06 NOTE — Progress Notes (Signed)
Called report [email or web address].  Dc'd wound vac to bilateral groins, placed dry dressing over sites.  Patient had voided in bed and soaked dressing to right inner thigh, site cleaned and dry foam placed over site.  PICC flushed and clamped.  Pt denies additional needs at present.  PTAR here for patient. Sree[place], RN

## 2022-10-06 NOTE — TOC Progression Note (Signed)
Transition of Care [facility]) - Progression Note    Patient Details  Name: Heather [name] MRN: [id number] Date of Birth: 1950-11-16  Transition of Care [facility]) CM/SW [place], [place] Phone Number: 10/06/2022, 9:46 AM  Clinical Narrative:    Authorization approved for this pt, but is being switched to [facility], per family request. CSW advised this should result today. Family requesting PTAR, paperwork to be placed on chart. TOC will continue to follow for DC needs.  Expected Discharge Plan: Skilled Nursing Facility Barriers to Discharge: Insurance Authorization, SNF Pending bed offer, Continued Medical Work up  Expected Discharge Plan and Services     Post Acute Care Choice: [place] arrangements for the past 2 months: Single Family Home Expected Discharge Date: 10/06/22                                     Social Determinants of Health (SDOH) Interventions SDOH Screenings   Food Insecurity: No Food Insecurity (09/28/2022)  Recent Concern: [place] Present (08/11/2022)  Housing: Low Risk  (09/28/2022)  Transportation Needs: No Transportation Needs (09/28/2022)  Utilities: Not At Risk (09/28/2022)  Financial Resource Strain: Medium Risk (10/19/2018)  Physical Activity: Inactive (10/19/2018)  Social Connections: Unknown (10/19/2018)  Tobacco Use: High Risk (09/28/2022)    Readmission Risk Interventions    11/13/2020    2:27 PM  Readmission Risk Prevention Plan  Transportation Screening Complete  PCP or Specialist Appt within 3-5 Days Complete  HRI or [place] Complete  Social Work Consult for [place] Planning/Counseling Complete  Palliative Care Screening Not Applicable  Medication Review Press photographer) Complete

## 2022-10-07 ENCOUNTER — Ambulatory Visit: Payer: Medicare HMO | Admitting: Internal Medicine

## 2022-10-07 NOTE — Telephone Encounter (Signed)
[facility] [place] regarding patient's appointment. Per facility will need to reschedule due to transportation. LPN stated during call she never received updated orders for Zosyn. Informed her hospital staff called with verbal order yesterday, but relayed Regimen to ensure patient gets dose today. Leatrice Jewels, RMA

## 2022-10-07 NOTE — Telephone Encounter (Signed)
Reviewed chart. Ordered updated by Inpatient Pharmacy team and called to SNF late yesterday.  Indication: Infected groin wound/graft Regimen: Zosyn 3.375g IV every 6 hours (intermittent dosing infused over 30 minutes) End date: 10/28/22  Eugenia Mcalpine, LPN

## 2022-10-08 ENCOUNTER — Ambulatory Visit (HOSPITAL_COMMUNITY): Payer: Medicare HMO

## 2022-10-15 ENCOUNTER — Telehealth: Payer: Self-pay | Admitting: *Deleted

## 2022-10-15 NOTE — Telephone Encounter (Signed)
-----   Message from Quentin Angst, [place] sent at 10/13/2022  4:43 PM EST ----- Regarding: 10/20/22 Hi Pam,  This is a patient that is coming to see Barbera Setters next Monday for a follow up visit post 14 day monitor. I don't see the results in the patient's chart.   Sorry to bother you, Jama Flavors.

## 2022-10-15 NOTE — Telephone Encounter (Signed)
No answer/Voicemail box is full.  

## 2022-10-17 ENCOUNTER — Encounter: Payer: Self-pay | Admitting: Emergency Medicine

## 2022-10-17 ENCOUNTER — Emergency Department: Payer: Medicare HMO

## 2022-10-17 DIAGNOSIS — L03314 Cellulitis of groin: Secondary | ICD-10-CM | POA: Diagnosis not present

## 2022-10-17 DIAGNOSIS — I2510 Atherosclerotic heart disease of native coronary artery without angina pectoris: Secondary | ICD-10-CM | POA: Diagnosis present

## 2022-10-17 DIAGNOSIS — I252 Old myocardial infarction: Secondary | ICD-10-CM

## 2022-10-17 DIAGNOSIS — I110 Hypertensive heart disease with heart failure: Secondary | ICD-10-CM | POA: Diagnosis present

## 2022-10-17 DIAGNOSIS — E1142 Type 2 diabetes mellitus with diabetic polyneuropathy: Secondary | ICD-10-CM | POA: Diagnosis present

## 2022-10-17 DIAGNOSIS — Z882 Allergy status to sulfonamides status: Secondary | ICD-10-CM

## 2022-10-17 DIAGNOSIS — Y92009 Unspecified place in unspecified non-institutional (private) residence as the place of occurrence of the external cause: Secondary | ICD-10-CM

## 2022-10-17 DIAGNOSIS — E1165 Type 2 diabetes mellitus with hyperglycemia: Secondary | ICD-10-CM | POA: Diagnosis present

## 2022-10-17 DIAGNOSIS — E1151 Type 2 diabetes mellitus with diabetic peripheral angiopathy without gangrene: Secondary | ICD-10-CM | POA: Diagnosis present

## 2022-10-17 DIAGNOSIS — Z818 Family history of other mental and behavioral disorders: Secondary | ICD-10-CM

## 2022-10-17 DIAGNOSIS — Z89612 Acquired absence of left leg above knee: Secondary | ICD-10-CM

## 2022-10-17 DIAGNOSIS — F259 Schizoaffective disorder, unspecified: Secondary | ICD-10-CM | POA: Diagnosis present

## 2022-10-17 DIAGNOSIS — Z85828 Personal history of other malignant neoplasm of skin: Secondary | ICD-10-CM

## 2022-10-17 DIAGNOSIS — Z91041 Radiographic dye allergy status: Secondary | ICD-10-CM

## 2022-10-17 DIAGNOSIS — Z953 Presence of xenogenic heart valve: Secondary | ICD-10-CM

## 2022-10-17 DIAGNOSIS — Z7951 Long term (current) use of inhaled steroids: Secondary | ICD-10-CM

## 2022-10-17 DIAGNOSIS — Z794 Long term (current) use of insulin: Secondary | ICD-10-CM

## 2022-10-17 DIAGNOSIS — I5032 Chronic diastolic (congestive) heart failure: Secondary | ICD-10-CM | POA: Diagnosis present

## 2022-10-17 DIAGNOSIS — J449 Chronic obstructive pulmonary disease, unspecified: Secondary | ICD-10-CM | POA: Diagnosis present

## 2022-10-17 DIAGNOSIS — Z7902 Long term (current) use of antithrombotics/antiplatelets: Secondary | ICD-10-CM

## 2022-10-17 DIAGNOSIS — K219 Gastro-esophageal reflux disease without esophagitis: Secondary | ICD-10-CM | POA: Diagnosis present

## 2022-10-17 DIAGNOSIS — E785 Hyperlipidemia, unspecified: Secondary | ICD-10-CM | POA: Diagnosis present

## 2022-10-17 DIAGNOSIS — Z91013 Allergy to seafood: Secondary | ICD-10-CM

## 2022-10-17 DIAGNOSIS — W19XXXA Unspecified fall, initial encounter: Secondary | ICD-10-CM | POA: Diagnosis present

## 2022-10-17 DIAGNOSIS — F17210 Nicotine dependence, cigarettes, uncomplicated: Secondary | ICD-10-CM | POA: Diagnosis present

## 2022-10-17 DIAGNOSIS — Z955 Presence of coronary angioplasty implant and graft: Secondary | ICD-10-CM

## 2022-10-17 LAB — BASIC METABOLIC PANEL
Anion gap: 11 (ref 5–15)
BUN: 16 mg/dL (ref 8–23)
CO2: 24 mmol/L (ref 22–32)
Calcium: 9.3 mg/dL (ref 8.9–10.3)
Chloride: 102 mmol/L (ref 98–111)
Creatinine, Ser: 0.8 mg/dL (ref 0.44–1.00)
GFR, Estimated: >60 mL/min (ref >60)
Glucose, Bld: 247 mg/dL — ABNORMAL HIGH (ref 70–99)
Potassium: 3.6 mmol/L (ref 3.5–5.1)
Sodium: 137 mmol/L (ref 135–145)

## 2022-10-17 LAB — TROPONIN I (HIGH SENSITIVITY): Troponin I (High Sensitivity): 8 ng/L (ref <18)

## 2022-10-17 LAB — CBC
HCT: 35.3 % — ABNORMAL LOW (ref 36.0–46.0)
Hemoglobin: 11 g/dL — ABNORMAL LOW (ref 12.0–15.0)
MCH: 28.5 pg (ref 26.0–34.0)
MCHC: 31.2 g/dL (ref 30.0–36.0)
MCV: 91.5 fL (ref 80.0–100.0)
Platelets: 453 10*3/uL — ABNORMAL HIGH (ref 150–400)
RBC: 3.86 MIL/uL — ABNORMAL LOW (ref 3.87–5.11)
RDW: 16.3 % — ABNORMAL HIGH (ref 11.5–15.5)
WBC: 9.4 10*3/uL (ref 4.0–10.5)
nRBC: 0 % (ref 0.0–0.2)

## 2022-10-17 NOTE — ED Triage Notes (Signed)
Pt arrives via [facility] from home with complaints of weakness. Pt left AMA from rehab today. Pt states she slid out of her recliner today - endorses back pain which is chronic. Denies hitting her head, no LOC, CP, or SOB.

## 2022-10-17 NOTE — ED Notes (Signed)
Heather [name] (brother) [phone].

## 2022-10-18 ENCOUNTER — Other Ambulatory Visit: Payer: Self-pay

## 2022-10-18 ENCOUNTER — Inpatient Hospital Stay
Admission: EM | Admit: 2022-10-18 | Discharge: 2022-10-22 | DRG: 603 | Disposition: A | Discharge status: Home Health | Payer: Medicare HMO | Attending: Obstetrics and Gynecology | Admitting: Obstetrics and Gynecology

## 2022-10-18 ENCOUNTER — Emergency Department: Payer: Medicare HMO

## 2022-10-18 DIAGNOSIS — S31104D Unspecified open wound of abdominal wall, left lower quadrant without penetration into peritoneal cavity, subsequent encounter: Secondary | ICD-10-CM | POA: Diagnosis not present

## 2022-10-18 DIAGNOSIS — S31104A Unspecified open wound of abdominal wall, left lower quadrant without penetration into peritoneal cavity, initial encounter: Secondary | ICD-10-CM | POA: Insufficient documentation

## 2022-10-18 DIAGNOSIS — R531 Weakness: Secondary | ICD-10-CM

## 2022-10-18 DIAGNOSIS — S31109A Unspecified open wound of abdominal wall, unspecified quadrant without penetration into peritoneal cavity, initial encounter: Secondary | ICD-10-CM | POA: Diagnosis present

## 2022-10-18 DIAGNOSIS — I5032 Chronic diastolic (congestive) heart failure: Secondary | ICD-10-CM | POA: Diagnosis present

## 2022-10-18 DIAGNOSIS — Z89612 Acquired absence of left leg above knee: Secondary | ICD-10-CM

## 2022-10-18 DIAGNOSIS — L8990 Pressure ulcer of unspecified site, unspecified stage: Secondary | ICD-10-CM | POA: Insufficient documentation

## 2022-10-18 DIAGNOSIS — L03314 Cellulitis of groin: Secondary | ICD-10-CM | POA: Diagnosis present

## 2022-10-18 DIAGNOSIS — E1165 Type 2 diabetes mellitus with hyperglycemia: Secondary | ICD-10-CM

## 2022-10-18 DIAGNOSIS — Y92009 Unspecified place in unspecified non-institutional (private) residence as the place of occurrence of the external cause: Secondary | ICD-10-CM

## 2022-10-18 DIAGNOSIS — I10 Essential (primary) hypertension: Secondary | ICD-10-CM | POA: Diagnosis present

## 2022-10-18 DIAGNOSIS — S31109D Unspecified open wound of abdominal wall, unspecified quadrant without penetration into peritoneal cavity, subsequent encounter: Secondary | ICD-10-CM | POA: Diagnosis not present

## 2022-10-18 DIAGNOSIS — E162 Hypoglycemia, unspecified: Secondary | ICD-10-CM | POA: Insufficient documentation

## 2022-10-18 DIAGNOSIS — W19XXXA Unspecified fall, initial encounter: Secondary | ICD-10-CM | POA: Diagnosis not present

## 2022-10-18 DIAGNOSIS — I739 Peripheral vascular disease, unspecified: Secondary | ICD-10-CM | POA: Diagnosis present

## 2022-10-18 DIAGNOSIS — I2510 Atherosclerotic heart disease of native coronary artery without angina pectoris: Secondary | ICD-10-CM | POA: Diagnosis present

## 2022-10-18 LAB — CBC
HCT: 30.5 % — ABNORMAL LOW (ref 36.0–46.0)
Hemoglobin: 9.6 g/dL — ABNORMAL LOW (ref 12.0–15.0)
MCH: 28.3 pg (ref 26.0–34.0)
MCHC: 31.5 g/dL (ref 30.0–36.0)
MCV: 90 fL (ref 80.0–100.0)
Platelets: 412 10*3/uL — ABNORMAL HIGH (ref 150–400)
RBC: 3.39 MIL/uL — ABNORMAL LOW (ref 3.87–5.11)
RDW: 16.2 % — ABNORMAL HIGH (ref 11.5–15.5)
WBC: 6.8 10*3/uL (ref 4.0–10.5)
nRBC: 0 % (ref 0.0–0.2)

## 2022-10-18 LAB — BASIC METABOLIC PANEL
Anion gap: 6 (ref 5–15)
BUN: 14 mg/dL (ref 8–23)
CO2: 27 mmol/L (ref 22–32)
Calcium: 9 mg/dL (ref 8.9–10.3)
Chloride: 105 mmol/L (ref 98–111)
Creatinine, Ser: 0.65 mg/dL (ref 0.44–1.00)
GFR, Estimated: >60 mL/min (ref >60)
Glucose, Bld: 171 mg/dL — ABNORMAL HIGH (ref 70–99)
Potassium: 3.5 mmol/L (ref 3.5–5.1)
Sodium: 138 mmol/L (ref 135–145)

## 2022-10-18 LAB — GLUCOSE, CAPILLARY: Glucose-Capillary: 238 mg/dL — ABNORMAL HIGH (ref 70–99)

## 2022-10-18 MED ORDER — MAGNESIUM HYDROXIDE 400 MG/5ML PO SUSP: 30.0000 mL | Freq: Every day | ORAL | Status: DC | PRN

## 2022-10-18 MED ORDER — ACETAMINOPHEN 650 MG RE SUPP: 650.0000 mg | Freq: Four times a day (QID) | RECTAL | Status: DC | PRN

## 2022-10-18 MED ORDER — TRAZODONE HCL 50 MG PO TABS
25.0000 mg | ORAL_TABLET | Freq: Every evening | ORAL | Status: DC | PRN
  Administered 2022-10-21: 25 mg via ORAL
  Filled 2022-10-18: qty 1

## 2022-10-18 MED ORDER — LEVETIRACETAM 500 MG PO TABS
500.0000 mg | ORAL_TABLET | Freq: Two times a day (BID) | ORAL | Status: DC
  Administered 2022-10-18 – 2022-10-22 (×9): 500 mg via ORAL
  Filled 2022-10-18 (×9): qty 1

## 2022-10-18 MED ORDER — VITAMIN D 25 MCG (1000 UNIT) PO TABS
1000.0000 [IU] | ORAL_TABLET | Freq: Every day | ORAL | Status: DC
  Administered 2022-10-18 – 2022-10-22 (×5): 1000 [IU] via ORAL
  Filled 2022-10-18 (×5): qty 1

## 2022-10-18 MED ORDER — ESCITALOPRAM OXALATE 10 MG PO TABS
10.0000 mg | ORAL_TABLET | Freq: Every day | ORAL | Status: DC
  Administered 2022-10-18 – 2022-10-22 (×5): 10 mg via ORAL
  Filled 2022-10-18 (×5): qty 1

## 2022-10-18 MED ORDER — FLUTICASONE PROPIONATE 50 MCG/ACT NA SUSP: 1.0000 | Freq: Every day | NASAL | Status: DC | PRN

## 2022-10-18 MED ORDER — DRONABINOL 2.5 MG PO CAPS
2.5000 mg | ORAL_CAPSULE | Freq: Two times a day (BID) | ORAL | Status: DC
  Administered 2022-10-18 – 2022-10-22 (×9): 2.5 mg via ORAL
  Filled 2022-10-18 (×9): qty 1

## 2022-10-18 MED ORDER — ONDANSETRON HCL 4 MG/2ML IJ SOLN
4.0000 mg | Freq: Four times a day (QID) | INTRAMUSCULAR | Status: DC | PRN
  Administered 2022-10-20 – 2022-10-21 (×2): 4 mg via INTRAVENOUS
  Filled 2022-10-18 (×2): qty 2

## 2022-10-18 MED ORDER — HYDRALAZINE HCL 50 MG PO TABS
50.0000 mg | ORAL_TABLET | Freq: Three times a day (TID) | ORAL | Status: DC
  Administered 2022-10-18 – 2022-10-22 (×11): 50 mg via ORAL
  Filled 2022-10-18 (×13): qty 1

## 2022-10-18 MED ORDER — ALBUTEROL SULFATE HFA 108 (90 BASE) MCG/ACT IN AERS: 2.0000 | INHALATION_SPRAY | RESPIRATORY_TRACT | Status: DC | PRN

## 2022-10-18 MED ORDER — TRAMADOL HCL 50 MG PO TABS
50.0000 mg | ORAL_TABLET | Freq: Three times a day (TID) | ORAL | Status: DC | PRN
  Administered 2022-10-18 – 2022-10-22 (×5): 50 mg via ORAL
  Filled 2022-10-18 (×5): qty 1

## 2022-10-18 MED ORDER — AMLODIPINE BESYLATE 5 MG PO TABS
10.0000 mg | ORAL_TABLET | Freq: Every day | ORAL | Status: DC
  Administered 2022-10-18 – 2022-10-22 (×5): 10 mg via ORAL
  Filled 2022-10-18 (×5): qty 2

## 2022-10-18 MED ORDER — ACETAMINOPHEN 325 MG PO TABS
650.0000 mg | ORAL_TABLET | Freq: Four times a day (QID) | ORAL | Status: DC | PRN
  Administered 2022-10-22: 650 mg via ORAL
  Filled 2022-10-18: qty 2

## 2022-10-18 MED ORDER — DOCUSATE SODIUM 100 MG PO CAPS
100.0000 mg | ORAL_CAPSULE | Freq: Every day | ORAL | Status: DC
  Administered 2022-10-18 – 2022-10-22 (×5): 100 mg via ORAL
  Filled 2022-10-18 (×5): qty 1

## 2022-10-18 MED ORDER — MOMETASONE FURO-FORMOTEROL FUM 200-5 MCG/ACT IN AERO
2.0000 | INHALATION_SPRAY | Freq: Two times a day (BID) | RESPIRATORY_TRACT | Status: DC
  Administered 2022-10-18 – 2022-10-22 (×9): 2 via RESPIRATORY_TRACT
  Filled 2022-10-18: qty 8.8

## 2022-10-18 MED ORDER — CLOPIDOGREL BISULFATE 75 MG PO TABS
75.0000 mg | ORAL_TABLET | Freq: Every day | ORAL | Status: DC
  Administered 2022-10-18 – 2022-10-22 (×5): 75 mg via ORAL
  Filled 2022-10-18 (×5): qty 1

## 2022-10-18 MED ORDER — ADULT MULTIVITAMIN W/MINERALS CH
1.0000 | ORAL_TABLET | Freq: Every day | ORAL | Status: DC
  Filled 2022-10-18 (×5): qty 1

## 2022-10-18 MED ORDER — TRAZODONE HCL 50 MG PO TABS: 25.0000 mg | ORAL_TABLET | Freq: Every evening | ORAL | Status: DC | PRN

## 2022-10-18 MED ORDER — ALBUTEROL SULFATE (2.5 MG/3ML) 0.083% IN NEBU: 2.5000 mg | INHALATION_SOLUTION | RESPIRATORY_TRACT | Status: DC | PRN

## 2022-10-18 MED ORDER — MORPHINE SULFATE (PF) 2 MG/ML IV SOLN: 2.0000 mg | INTRAVENOUS | Status: DC | PRN

## 2022-10-18 MED ORDER — ENOXAPARIN SODIUM 40 MG/0.4ML IJ SOSY
40.0000 mg | PREFILLED_SYRINGE | INTRAMUSCULAR | Status: DC
  Administered 2022-10-18 – 2022-10-22 (×5): 40 mg via SUBCUTANEOUS
  Filled 2022-10-18 (×5): qty 0.4

## 2022-10-18 MED ORDER — SODIUM CHLORIDE 0.9 % IV SOLN: 2.0000 g | Freq: Once | INTRAVENOUS | Status: DC

## 2022-10-18 MED ORDER — PIPERACILLIN-TAZOBACTAM 3.375 G IVPB 30 MIN
3.3750 g | Freq: Once | INTRAVENOUS | Status: AC
  Administered 2022-10-18: 3.375 g via INTRAVENOUS
  Filled 2022-10-18: qty 50

## 2022-10-18 MED ORDER — VANCOMYCIN HCL IN DEXTROSE 1-5 GM/200ML-% IV SOLN
1000.0000 mg | Freq: Once | INTRAVENOUS | Status: AC
  Administered 2022-10-18: 1000 mg via INTRAVENOUS
  Filled 2022-10-18: qty 200

## 2022-10-18 MED ORDER — ONDANSETRON HCL 4 MG PO TABS: 4.0000 mg | ORAL_TABLET | Freq: Four times a day (QID) | ORAL | Status: DC | PRN

## 2022-10-18 MED ORDER — CHLORHEXIDINE GLUCONATE CLOTH 2 % EX PADS
6.0000 | MEDICATED_PAD | Freq: Every day | CUTANEOUS | Status: DC
  Administered 2022-10-18: 6 via TOPICAL

## 2022-10-18 MED ORDER — INSULIN ASPART 100 UNIT/ML IJ SOLN
0.0000 [IU] | Freq: Three times a day (TID) | INTRAMUSCULAR | Status: DC
  Administered 2022-10-18: 3 [IU] via SUBCUTANEOUS
  Administered 2022-10-19 (×3): 2 [IU] via SUBCUTANEOUS
  Administered 2022-10-20: 1 [IU] via SUBCUTANEOUS
  Administered 2022-10-20: 2 [IU] via SUBCUTANEOUS
  Administered 2022-10-20: 1 [IU] via SUBCUTANEOUS
  Administered 2022-10-20: 2 [IU] via SUBCUTANEOUS
  Administered 2022-10-21 – 2022-10-22 (×3): 3 [IU] via SUBCUTANEOUS
  Administered 2022-10-22: 2 [IU] via SUBCUTANEOUS
  Filled 2022-10-18 (×12): qty 1

## 2022-10-18 MED ORDER — LORATADINE 10 MG PO TABS
10.0000 mg | ORAL_TABLET | Freq: Every day | ORAL | Status: DC
  Administered 2022-10-18 – 2022-10-22 (×5): 10 mg via ORAL
  Filled 2022-10-18 (×5): qty 1

## 2022-10-18 MED ORDER — VANCOMYCIN HCL IN DEXTROSE 1-5 GM/200ML-% IV SOLN: 1000.0000 mg | Freq: Once | INTRAVENOUS | Status: DC

## 2022-10-18 MED ORDER — OXYCODONE-ACETAMINOPHEN 5-325 MG PO TABS
1.0000 | ORAL_TABLET | Freq: Once | ORAL | Status: AC
  Administered 2022-10-18: 1 via ORAL
  Filled 2022-10-18: qty 1

## 2022-10-18 MED ORDER — OXYCODONE-ACETAMINOPHEN 5-325 MG PO TABS
1.0000 | ORAL_TABLET | Freq: Four times a day (QID) | ORAL | Status: DC | PRN
  Administered 2022-10-18 – 2022-10-22 (×11): 1 via ORAL
  Filled 2022-10-18 (×11): qty 1

## 2022-10-18 MED ORDER — SENNOSIDES-DOCUSATE SODIUM 8.6-50 MG PO TABS
1.0000 | ORAL_TABLET | Freq: Every day | ORAL | Status: DC
  Administered 2022-10-18 – 2022-10-22 (×5): 1 via ORAL
  Filled 2022-10-18 (×5): qty 1

## 2022-10-18 MED ORDER — ACETAMINOPHEN 325 MG PO TABS: 650.0000 mg | ORAL_TABLET | Freq: Four times a day (QID) | ORAL | Status: DC | PRN

## 2022-10-18 MED ORDER — INSULIN GLARGINE-YFGN 100 UNIT/ML ~~LOC~~ SOLN
10.0000 [IU] | Freq: Two times a day (BID) | SUBCUTANEOUS | Status: DC
  Administered 2022-10-18 – 2022-10-20 (×6): 10 [IU] via SUBCUTANEOUS
  Filled 2022-10-18 (×7): qty 0.1

## 2022-10-18 MED ORDER — MIRTAZAPINE 15 MG PO TABS
7.5000 mg | ORAL_TABLET | Freq: Every day | ORAL | Status: DC
  Administered 2022-10-18 – 2022-10-21 (×4): 7.5 mg via ORAL
  Filled 2022-10-18 (×4): qty 1

## 2022-10-18 MED ORDER — POLYETHYLENE GLYCOL 3350 17 G PO PACK
17.0000 g | PACK | Freq: Every day | ORAL | Status: DC
  Administered 2022-10-18: 17 g via ORAL
  Filled 2022-10-18 (×4): qty 1

## 2022-10-18 MED ORDER — PANTOPRAZOLE SODIUM 40 MG PO TBEC
40.0000 mg | DELAYED_RELEASE_TABLET | Freq: Every day | ORAL | Status: DC
  Administered 2022-10-18 – 2022-10-22 (×5): 40 mg via ORAL
  Filled 2022-10-18 (×5): qty 1

## 2022-10-18 MED ORDER — SODIUM CHLORIDE 0.9 % IV SOLN
2.0000 g | Freq: Two times a day (BID) | INTRAVENOUS | Status: DC
  Administered 2022-10-18: 2 g via INTRAVENOUS
  Filled 2022-10-18: qty 2

## 2022-10-18 MED ORDER — VANCOMYCIN HCL 750 MG/150ML IV SOLN: 750.0000 mg | INTRAVENOUS | Status: DC

## 2022-10-18 MED ORDER — PNEUMOCOCCAL 20-VAL CONJ VACC 0.5 ML IM SUSY: 0.5000 mL | PREFILLED_SYRINGE | INTRAMUSCULAR | Status: DC | PRN

## 2022-10-18 MED ORDER — ATORVASTATIN CALCIUM 20 MG PO TABS
80.0000 mg | ORAL_TABLET | Freq: Every day | ORAL | Status: DC
  Administered 2022-10-18 – 2022-10-21 (×4): 80 mg via ORAL
  Filled 2022-10-18 (×4): qty 4

## 2022-10-18 MED ORDER — SODIUM CHLORIDE 0.9 % IV SOLN: INTRAVENOUS | Status: DC

## 2022-10-18 MED ORDER — CARVEDILOL 6.25 MG PO TABS
6.2500 mg | ORAL_TABLET | Freq: Two times a day (BID) | ORAL | Status: DC
  Administered 2022-10-18 – 2022-10-22 (×9): 6.25 mg via ORAL
  Filled 2022-10-18 (×9): qty 1

## 2022-10-18 NOTE — H&P (Signed)
History and Physical    Patient: Heather [name] [id number] DOB: 1950/09/18 DOA: 10/18/2022 DOS: the patient was seen and examined on 10/18/2022 PCP: [name], [place]  Patient coming from: Home  Chief Complaint:  Chief Complaint  Patient presents with   Weakness   HPI: Heather [name] is a 72 y.o. 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  Patient was recently in the hospital for bilateral inguinal wound infection, she was sent to nursing home for rehab.  She is signed out [facility] and the left the SNF yesterday.  However, patient was found significant weakness, could not get around.  She had a fall at home, was complaining some lower back pain, x-ray did not show acute changes.  She came back to the emergency room requesting placed back to nursing home. Review of Systems: As mentioned in the history of present illness. All other systems reviewed and are negative. Past Medical History:  Diagnosis Date   (HFpEF) heart failure with preserved ejection fraction (HCC)    Angina pectoris ([place])    Anxiety    Aortic atherosclerosis ([place])    Basal cell carcinoma    Bilateral carotid artery disease ([place])    a.) carotid doppler 03/18/2021: [id number] RICA, [id number] LICA   Bipolar disorder (HCC)    Cervicalgia    Chronic mesenteric ischemia (HCC)    a.) s/p PTA 11/13/2020 --> 6 x 16 mm lifestream ballon expanding stent to SMA   COPD (chronic obstructive pulmonary disease) (HCC)    Coronary artery disease 03/20/2014   a.) LHC/PCI 03/20/2014: 70% mLAD (2.5 x 28 mm Xience Alpine DES), 70% dLCx, 70% dRCA; b.) LHC 01/25/2016: 90% ISR mLAD --> mLAD dissection with in stent --> 2.75 x 22  mm Resolute DES; c.) R/LHC 09/29/2016: 50% ISR mLAD, 70% dLAD, mPA 37, PCWP 24, AO sat 90%, PA sat 46%, CO 3.7, CI 2.46 - med mgmt   DDD (degenerative disc disease), cervical    Depression    Diabetic polyneuropathy (HCC)    GERD (gastroesophageal reflux disease)    Hyperlipidemia    Hypertension    Long term current use of antithrombotics/antiplatelets    a.) on DAPT (ASA + clopidogrel)   Memory loss    NSTEMI (non-ST elevated myocardial infarction) ([place]) 07/2016   a.) troponins trended (normal < 0.034 ng/mL): 0.325 --> 1.480 --> 2.330 --> 1.660 --> 0.169 ng/mL   PAD (peripheral artery disease) ([place])    a.) s/p PTA and stenting SMA 11/13/2020; b.) s/p PTA and stenting of RIGHT SFA and popliteal 04/12/2021; c.) s/p PTA and kissing balloon stents to BILATERAL CIAs 05/28/2021   Respiratory failure with hypoxia (HCC)    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   a.) 23 mm Sapien 3 via suprasternal approach   Schizophrenia ([place])    Severe aortic stenosis    a.) s/p TAVR 09/30/2016; 23 mm Sapien 3 via a suprasternal approach   Type 2 diabetes mellitus treated with insulin ([place])    Vertigo    Past Surgical History:  Procedure Laterality Date   AMPUTATION Left 08/14/2022   Procedure: ABOVE KNEE AMPUTATION;  Surgeon: Waynetta Sandy, MD;  Location: [facility];  Service: Vascular;  Laterality: Left;   AORTIC VALVE REPLACEMENT  09/30/2016   Procedure:  TRANSCATHETER AORTIC VALVE REPAIR   APPLICATION OF WOUND VAC Bilateral 08/14/2022   Procedure: APPLICATION OF WOUND VAC;  Surgeon: Waynetta Sandy, MD;  Location: [facility];  Service: Vascular;  Laterality: Bilateral;   BLADDER SURGERY     CATARACT EXTRACTION, BILATERAL     CHOLECYSTECTOMY     COLONOSCOPY     CORONARY ANGIOPLASTY WITH STENT PLACEMENT  03/20/2014   Procedure: CORONARY ANGIOPLASTY WITH STENT PLACEMENT; Location: [facility]; Surgeon: Jacques Earthly, MD   CORONARY ANGIOPLASTY WITH STENT PLACEMENT Left  01/25/2016   Procedure: CORONARY ANGIOPLASTY WITH STENT PLACEMENT; Location: [facility]; Surgeon: Jacques Earthly, MD   ENDARTERECTOMY FEMORAL Bilateral 08/04/2022   Procedure: BILATERAL FEMORAL ENDARTERECTOMY;  Surgeon: Waynetta Sandy, MD;  Location: [facility];  Service: Vascular;  Laterality: Bilateral;   FASCIECTOMY Bilateral 08/04/2022   Procedure: BILATERAL LOWER EXTREMITY FASCIECTOMIES;  Surgeon: Waynetta Sandy, MD;  Location: [facility];  Service: Vascular;  Laterality: Bilateral;   FOOT SURGERY Right    GROIN DEBRIDEMENT Bilateral 08/14/2022   Procedure: GROIN DEBRIDEMENT;  Surgeon: Waynetta Sandy, MD;  Location: [facility];  Service: Vascular;  Laterality: Bilateral;   GROIN DEBRIDEMENT Bilateral 09/16/2022   Procedure: GROIN DEBRIDEMENT POSSIBLE VAC PLACMENT;  Surgeon: Broadus John, MD;  Location: [facility];  Service: Vascular;  Laterality: Bilateral;   HEMORRHOID SURGERY     INSERTION OF ILIAC STENT Bilateral 08/04/2022   Procedure: INSERTION OF BILATERAL ILIAC STENTS;  Surgeon: Waynetta Sandy, MD;  Location: [facility];  Service: Vascular;  Laterality: Bilateral;   IRRIGATION AND DEBRIDEMENT HEMATOMA Left 07/08/2014   GROIN   LOWER EXTREMITY ANGIOGRAM Bilateral 08/04/2022   Procedure: LOWER EXTREMITY ANGIOGRAM;  Surgeon: Waynetta Sandy, MD;  Location: [facility];  Service: Vascular;  Laterality: Bilateral;   LOWER EXTREMITY ANGIOGRAPHY Right 04/12/2021   Procedure: Lower Extremity Angiography;  Surgeon: [name], MD;  Location: [facility];  Service: Cardiovascular;  Laterality: Right;   LOWER EXTREMITY ANGIOGRAPHY Left 05/28/2021   Procedure: LOWER EXTREMITY ANGIOGRAPHY;  Surgeon: [name], MD;  Location: [facility];  Service: Cardiovascular;  Laterality: Left;   LOWER EXTREMITY ANGIOGRAPHY Left 03/25/2022   Procedure: Lower Extremity Angiography;  Surgeon: [name], MD;  Location: [facility];   Service: Cardiovascular;  Laterality: Left;   PATCH ANGIOPLASTY  08/04/2022   Procedure: LEFT FEMORAL PATCH ANGIOPLASTY USING XENOSURE BIOLOGIC PATCH;  Surgeon: Waynetta Sandy, MD;  Location: [facility];  Service: Vascular;;   PSEUDOANEURYSM REPAIR Left 06/30/2014   FEMORAL ARTERY   RIGHT/LEFT HEART CATH AND CORONARY ANGIOGRAPHY Bilateral 09/29/2016   Procedure: RIGHT/LEFT HEART CATH AND CORONARY ANGIOGRAPHY; Location [facility]; Surgeon: Jacques Earthly, MD   THROMBECTOMY FEMORAL ARTERY Bilateral 08/04/2022   Procedure: BILATERAL LOWER EXTREMITY THROMBECTOMY;  Surgeon: Waynetta Sandy, MD;  Location: [facility];  Service: Vascular;  Laterality: Bilateral;   TUBAL LIGATION     VISCERAL ANGIOGRAPHY N/A 11/13/2020   Procedure: VISCERAL ANGIOGRAPHY;  Surgeon: [name], MD;  Location: [facility];  Service: Cardiovascular;  Laterality: N/A;   Social History:  reports that she has been smoking cigarettes. She has a 72.00 pack-year smoking history. She has never used smokeless tobacco. She reports that she does not currently use alcohol. She reports that she does not currently use drugs.  Allergies  Allergen Reactions   Iodinated Contrast Media Hives   Sulfa Antibiotics Hives   Shellfish Allergy Nausea And Vomiting and Rash    Scallops specifically  Family History  Problem Relation Age of Onset   Mental illness Mother    Breast cancer Neg Hx     Prior to Admission medications   Medication Sig Start Date End Date Taking? Authorizing Provider  amLODipine (NORVASC) 10 MG tablet Take 1 tablet (10 mg total) by mouth daily. 09/29/22 09/29/23 Yes Thurnell Lose, MD  atorvastatin (LIPITOR) 80 MG tablet Take 80 mg by mouth at bedtime.   Yes [provider]  carvedilol (COREG) 6.25 MG tablet Take 1 tablet (6.25 mg total) by mouth 2 (two) times daily with a meal. 09/22/22 10/22/22 Yes Elodia Florence., MD  cetirizine (ZYRTEC) 10 MG tablet Take 10 mg by mouth  daily.   Yes [provider]  cholecalciferol (VITAMIN D3) 25 MCG (1000 UNIT) tablet Take 1,000 Units by mouth daily.   Yes [provider]  clopidogrel (PLAVIX) 75 MG tablet Take 1 tablet (75 mg total) by mouth daily. 11/15/20  Yes Regalado, Belkys A, MD  docusate sodium (COLACE) 100 MG capsule Take 1 capsule (100 mg total) by mouth daily. 09/30/22 10/30/22 Yes Thurnell Lose, MD  dronabinol (MARINOL) 2.5 MG capsule Take 1 capsule (2.5 mg total) by mouth 2 (two) times daily before lunch and supper. 09/22/22 10/22/22 Yes Elodia Florence., MD  escitalopram (LEXAPRO) 10 MG tablet Take 1 tablet (10 mg total) by mouth daily. 09/22/22 10/22/22 Yes Elodia Florence., MD  fluticasone-salmeterol (ADVAIR) 250-50 MCG/ACT AEPB Inhale 1 puff into the lungs daily.   Yes [provider]  hydrALAZINE (APRESOLINE) 50 MG tablet Take 1 tablet (50 mg total) by mouth 3 (three) times daily. 09/29/22 10/29/22 Yes Thurnell Lose, MD  insulin aspart (NOVOLOG) 100 UNIT/ML FlexPen Before each meal 3 times a day, 140-199 - 2 units, 200-250 - 4 units, 251-299 - 8 units,  300-349 - 10 units,  350 or above 12 units. I 09/29/22  Yes Thurnell Lose, MD  insulin glargine (LANTUS) 100 UNIT/ML injection Inject 0.1 mLs (10 Units total) into the skin 2 (two) times daily. 09/29/22  Yes Thurnell Lose, MD  levETIRAcetam (KEPPRA) 500 MG tablet Take 1 tablet (500 mg total) by mouth 2 (two) times daily. 10/06/22  Yes Charlynne Cousins, MD  metroNIDAZOLE (FLAGYL) 500 MG tablet Take 1 tablet (500 mg total) by mouth every 12 (twelve) hours. 09/22/22 10/28/22 Yes Elodia Florence., MD  mirtazapine (REMERON) 7.5 MG tablet Take 1 tablet (7.5 mg total) by mouth at bedtime. 09/22/22 10/22/22 Yes Elodia Florence., MD  Multiple Vitamins-Minerals (MULTIVITAMIN WITH MINERALS) tablet Take 1 tablet by mouth daily.   Yes [provider]  nystatin cream (MYCOSTATIN) Apply topically to rash on buttocks two  times a day for 14 days. 10/13/22 10/27/22 Yes [provider]  pantoprazole (PROTONIX) 40 MG tablet Take 1 tablet (40 mg total) by mouth daily. 09/22/22 10/22/22 Yes Elodia Florence., MD  senna-docusate (SENOKOT-S) 8.6-50 MG tablet Take 1 tablet by mouth daily. 09/30/22  Yes Thurnell Lose, MD  acetaminophen (TYLENOL) 325 MG tablet Take 2 tablets (650 mg total) by mouth every 6 (six) hours as needed for mild pain (or Fever >/= 101). 09/29/22   Thurnell Lose, MD  albuterol (VENTOLIN HFA) 108 (90 Base) MCG/ACT inhaler Inhale 2 puffs into the lungs every 4 (four) hours as needed for wheezing or shortness of breath.    [provider]  fluticasone (FLONASE) 50 MCG/ACT nasal spray Place 1 spray  into both nostrils daily as needed for allergies.    [provider]  heparin lock flush 100 UNIT/ML SOLN injection Inject 10 Units into the vein 3 (three) times daily. Use SASH method with IV ABT    [provider]  insulin aspart (NOVOLOG) 100 UNIT/ML injection Inject 3 Units into the skin 3 (three) times daily with meals. Hold for NPO or consuming less than 50% of meals Patient taking differently: Inject 3 Units into the skin See admin instructions. Inject 3 units subcutaneously with meals. HOLD for NPO or consuming < 50% of meals. 09/22/22   Elodia Florence., MD  polyethylene glycol (MIRALAX / GLYCOLAX) 17 g packet Take 17 g by mouth daily. Titrate as needed for constipation. Patient taking differently: Take 17 g by mouth daily. 09/22/22   Elodia Florence., MD  sodium chloride flush 0.9 % SOLN injection Inject 10 mLs into the vein 3 (three) times daily. Use SASH method with IV ABT    [provider]  traMADol (ULTRAM) 50 MG tablet Take 1 tablet (50 mg total) by mouth every 8 (eight) hours as needed for moderate pain or severe pain. 09/30/22   Thurnell Lose, MD  traZODone (DESYREL) 50 MG tablet Take 0.5 tablets (25 mg total) by mouth at bedtime as needed  for sleep. 09/22/22 10/22/22  Elodia Florence., MD    Physical Exam: Vitals:   10/17/22 2118 10/18/22 0138 10/18/22 0332 10/18/22 0738  BP: (!) 147/70 125/61 131/64 (!) 128/58  Pulse: 86 77 66 66  Resp: '18 18 20 18  '$ Temp: 98 F (36.7 C) 98.2 F (36.8 C) 98.3 F (36.8 C) 98.1 F (36.7 C)  TempSrc: Oral Oral Oral Oral  SpO2: 98% 96% 96% 96%  Weight:   54.4 kg   Height:   5' (1.524 m)    Physical Exam Constitutional:      General: She is not in acute distress.    Appearance: She is not toxic-appearing.  HENT:     Head: Normocephalic and atraumatic.     Nose: Nose normal. No congestion.     Mouth/Throat:     Mouth: Mucous membranes are moist.     Pharynx: Oropharynx is clear. No oropharyngeal exudate.  Eyes:     Extraocular Movements: Extraocular movements intact.     Conjunctiva/sclera: Conjunctivae normal.     Pupils: Pupils are equal, round, and reactive to light.  Cardiovascular:     Rate and Rhythm: Normal rate and regular rhythm.     Heart sounds: No murmur heard.    No gallop.  Pulmonary:     Effort: Pulmonary effort is normal. No respiratory distress.     Breath sounds: Normal breath sounds. No wheezing or rales.  Abdominal:     General: Abdomen is flat. Bowel sounds are normal. There is no distension.     Palpations: Abdomen is soft.     Tenderness: There is no abdominal tenderness.  Musculoskeletal:        General: Normal range of motion.     Cervical back: Normal range of motion and neck supple. No rigidity.     Right lower leg: No edema.     Comments: Left AKA.  Lymphadenopathy:     Cervical: No cervical adenopathy.  Skin:    General: Skin is warm and dry.     Coloration: Skin is not jaundiced.  Neurological:     General: No focal deficit present.     Mental  Status: She is alert and oriented to person, place, and time.     Cranial Nerves: No cranial nerve deficit.     Motor: No weakness.  Psychiatric:        Mood and Affect: Mood normal.         Thought Content: Thought content normal.     Data Reviewed:  Lumbar spine x-ray reviewed, lab results reviewed.  Assessment and Plan: Generalized weakness. Fall at home. Status post left AKA. Patient could not live by herself at home.  She came back for nursing home placement.  Will obtain PT/OT again, social work consult for nursing home placement.  Bilateral groin wounds. Patient recently treated for cellulitis, currently no evidence of infection.  Appreciate wound care consult.  Discontinue antibiotics, continue dressing changes per wound care.  Uncontrolled type 2 diabetes with hyperglycemia Restart long-acting insulin and started scale insulin.  Coronary artery disease Chronic diastolic congestive heart failure. Stable.  Essential hypertension. Resume home treatment.   Advance Care Planning:   Code Status: Full Code patient is a full code.  Consults: None  Family Communication: None  Severity of Illness: The appropriate patient status for this patient is OBSERVATION. Observation status is judged to be reasonable and necessary in order to provide the required intensity of service to ensure the patient's safety. The patient's presenting symptoms, physical exam findings, and initial radiographic and laboratory data in the context of their medical condition is felt to place them at decreased risk for further clinical deterioration. Furthermore, it is anticipated that the patient will be medically stable for discharge from the hospital within 2 midnights of admission.   Author: Sharen Hones, MD 10/18/2022 10:04 AM  For on call review [email or web address].

## 2022-10-18 NOTE — ED Provider Notes (Signed)
[facility] Provider Note    Event Date/Time   First MD Initiated Contact with Patient 10/18/22 0017     (approximate)   History   Chief Complaint Weakness   HPI  Heather [name] is a 72 y.o. female with past medical history of hypertension, diabetes, diastolic CHF, COPD, and schizoaffective disorder who presents to the ED complaining of weakness and back pain.  Patient reports that she signed herself out from rehab facility earlier today to return home.  She states that she felt like she could manage things herself but immediately had difficulty upon returning home.  She states that she has been feeling very weak and slid out of her recliner at home, injuring her lower back.  She denies hitting her head or losing consciousness, denies any injuries to her extremities.  She complains of pain in the middle of her lower back, has had back problems before but states it feels worse since the fall.  She still has a PICC line in place to her left upper extremity, has been receiving Zosyn for groin infection following vascular access.     Physical Exam   Triage Vital Signs: ED Triage Vitals  Enc Vitals Group     BP 10/17/22 2118 (!) 147/70     Pulse Rate 10/17/22 2118 86     Resp 10/17/22 2118 18     Temp 10/17/22 2118 98 F (36.7 C)     Temp Source 10/17/22 2118 Oral     SpO2 10/17/22 2118 98 %     Weight 10/17/22 2116 111 lb (50.3 kg)     Height 10/17/22 2116 5' (1.524 m)     Head Circumference --      Peak Flow --      Pain Score 10/17/22 2116 9     Pain Loc --      Pain Edu? --      Excl. in [place]? --     Most recent vital signs: Vitals:   10/17/22 2118 10/18/22 0138  BP: (!) 147/70 125/61  Pulse: 86 77  Resp: 18 18  Temp: 98 F (36.7 C) 98.2 F (36.8 C)  SpO2: 98% 96%    Constitutional: Alert and oriented. Eyes: Conjunctivae are normal. Head: Atraumatic. Nose: No congestion/rhinnorhea. Mouth/Throat: Mucous membranes are moist.  Neck: No  midline cervical spine tenderness to palpation. Cardiovascular: Normal rate, regular rhythm. Grossly normal heart sounds.  2+ radial pulses bilaterally.  Right groin vascular access site with erythema and mild tenderness, no purulent drainage noted.  Left groin vascular access sites with no erythema, warmth, or drainage. Respiratory: Normal respiratory effort.  No retractions. Lungs CTAB. Gastrointestinal: Soft and nontender. No distention. Musculoskeletal: No lower extremity tenderness nor edema.  Midline lumbar spinal tenderness to palpation noted, no midline thoracic spinal tenderness to palpation.  No upper extremity bony tenderness to palpation. Neurologic:  Normal speech and language. No gross focal neurologic deficits are appreciated.    ED Results / Procedures / Treatments   Labs (all labs ordered are listed, but only abnormal results are displayed) Labs Reviewed  BASIC METABOLIC PANEL - Abnormal; Notable for the following components:      Result Value   Glucose, Bld 247 (*)    All other components within normal limits  CBC - Abnormal; Notable for the following components:   RBC 3.86 (*)    Hemoglobin 11.0 (*)    HCT 35.3 (*)    RDW 16.3 (*)    Platelets  453 (*)    All other components within normal limits  URINALYSIS, ROUTINE W REFLEX MICROSCOPIC  TROPONIN I (HIGH SENSITIVITY)     EKG  ED ECG REPORT I, Blake Divine, the attending physician, personally viewed and interpreted this ECG.   Date: 10/18/2022  EKG Time: 21:23  Rate: 83  Rhythm: normal sinus rhythm  Axis: Normal  Intervals:none  ST&T Change: None  RADIOLOGY Chest x-ray reviewed and interpreted by me with no infiltrate, edema, or effusion.  PROCEDURES:  Critical Care performed: No  Procedures   MEDICATIONS ORDERED IN ED: Medications  oxyCODONE-acetaminophen (PERCOCET/ROXICET) 5-325 MG per tablet 1 tablet (1 tablet Oral Given 10/18/22 0045)  piperacillin-tazobactam (ZOSYN) IVPB 3.375 g (3.375 g  Intravenous New Bag/Given 10/18/22 0131)     IMPRESSION / MDM / ASSESSMENT AND PLAN / ED COURSE  I reviewed the triage vital signs and the nursing notes.                              72 y.o. female with past medical history of hypertension, diabetes, COPD, diastolic CHF, and schizoaffective disorder who presents to the ED complaining of generalized weakness and fall out of her recliner at home after she signed herself out from SNF earlier today.  Patient's presentation is most consistent with acute presentation with potential threat to life or bodily function.  Differential diagnosis includes, but is not limited to, lumbar spinal fracture, back contusion, chronic back pain, infected groin wound, AKI, anemia, UTI, and debility.  Patient chronically ill but nontoxic-appearing and in no acute distress, vital signs are unremarkable.  No evidence of significant trauma to her head or neck, she does have tenderness to palpation of her midline lumbar spine which we will further assess with x-ray, no evidence of injury to her extremities.  There are signs of ongoing infection to her right groin wound, left groin wound appears to be well-healing.  She had been receiving IV Zosyn at her nursing facility, but not since returning home, will give a dose here in the ED.  EKG is unremarkable and labs are reassuring with no significant anemia, leukocytosis, electrolyte abnormality, or AKI.  Troponin within normal limits, will check urinalysis given her worsening weakness.  X-ray imaging of her back is negative for acute traumatic injury.  Patient would benefit from admission for ongoing treatment of groin cellulitis with IV Zosyn as well as placement back into SNF.  Case discussed with hospitalist for admission.      FINAL CLINICAL IMPRESSION(S) / ED DIAGNOSES   Final diagnoses:  Generalized weakness  Cellulitis of groin     Rx / DC Orders   ED Discharge Orders     None        Note:  This  document was prepared using Dragon voice recognition software and may include unintentional dictation errors.   Blake Divine, MD 10/18/22 [id number]

## 2022-10-18 NOTE — Progress Notes (Addendum)
       CROSS COVER NOTE  NAME: Temima Segarra MRN: [id number] DOB : 06-Jan-1951    HPI/Events of Note   Patient with history of insulin dependent diabetes and other multiple co morbidities admitted today for weakness and fall. Nurse notified me regarding BID semglee scheduled without cbg schedule or sliding dose novolog scale  Assessment and  Interventions   Assessment:  Plan: ACHS glucose monitoring with sensitive sliding scale ordered       Kathlene Cote NP [facility]

## 2022-10-18 NOTE — Consult Note (Signed)
[facility] Nurse Consult Note: Reason for Consult:Bilateral inguinal full thickness wounds post debridement. Seen earlier this month for NPWT to these areas. Was in SNF Rehab facility but signed herself out according to EDP note. Slid out of recliner yesterday due to weakness. Known urinary incontinence. Wound type:Full thickness bilateral inguinal, Stage 3 Pressure Injury to left ischial tuberosity, Stage 1 pressure injury vs trauma to sacrum (nonblanching erythema) to sacrum. Pressure Injury POA: Yes Measurement:Bedside RN to measure today and document updated measurements on nursing flow sheet. Last measurements taken on 10/02/22 by my associate, H. Bullins are  Left IT Stage 3: 1cm x 2cm x 0.1cm with 50% yellow and 50% red wound bed, scant serous exudate Left inguinal: 7cm x 5cm x 2.3cm with 80% red and 20% yellow fibrinous tissue, Minimal seroous exudate Right inguinal: 4cm x 6cm, 2.5cm with  90% red, 10% yellow fibrinous tissue with minimal serous exudate Wound bed:As described above Drainage (amount, consistency, odor) As noted above Periwound: itnact Dressing procedure/placement/frequency: I have provided guidance for care using topical care that includes cleansing the inguinal wounds daily and covering with silver hydrofiber for absorption and donation of antimicrobial properties. This is to be changed daily. The left IT Stage 3 PI will be covered with xeroform (antimicrobial, nonadherent) and secured with silicone foam and changed daily and PRN soiling. The stage 1 PI to the sacrum will be covered with our house silicone foam dressing for the sacrum. Patient to be turned and repositioned from side to side and time in the supine position minimized. The heels are provided with bilateral pressure redistribution heel boots.  [place] nursing team will not follow, but will remain available to this patient, the nursing and medical teams.  Please re-consult if needed.  Thank you for inviting [place] to participate in  this patient's Plan of Care.  Maudie Flakes, MSN, RN, CNS, [facility], Serita Grammes, [employer], [employer] phone:  [phone]

## 2022-10-18 NOTE — Progress Notes (Signed)
Pharmacy Antibiotic Note  Heather [name] is a 72 y.o. female admitted on 10/18/2022 with cellulitis.  Pharmacy has been consulted for Cefepime & Vancomycin dosing for 7 days.  Plan: Cefepime 2 gm q12hr per indication & renal fxn.  Pt given Vancomycin 1000 mg once. Vancomycin 750 mg IV Q 24 hrs. Goal AUC 400-550. Expected AUC: [id number] SCr used: 0.8  Pharmacy will continue to follow and will adjust abx dosing whenever warranted.  Temp (24hrs), Avg:98.1 F (36.7 C), Min:98 F (36.7 C), Max:98.2 F (36.8 C)   Recent Labs  Lab 10/17/22 2127  WBC 9.4  CREATININE 0.80    Estimated Creatinine Clearance: 47.7 mL/min (by C-G formula based on SCr of 0.8 mg/dL).    Allergies  Allergen Reactions   Iodinated Contrast Media Hives   Sulfa Antibiotics Hives   Shellfish Allergy Nausea And Vomiting and Rash    Scallops specifically     Antimicrobials this admission: 2/24 Zosyn >> x 1 dose 2/24 Cefepime >> x 7 days 2/24 Vancomycin >> x 7 days  Microbiology results: No lab cx currently ordered or pending at this time.  Thank you for allowing pharmacy to be a part of this patient's care.  Renda Rolls, PharmD, [facility] 10/18/2022 2:46 AM

## 2022-10-18 NOTE — Evaluation (Signed)
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 Pt is in agreement to OT evaluation. Pt is alert and oriented x4, increased time required for processing, questionable safety awareness throughout. Pt reports she discharged from SNF after 21 days and reports she thought she could manage ADL at home, however was unable to do so. L AKA residual limb with small scabbing, team notified. Pt presents with deficits in strength, endurance, activity tolerance, balance all affecting safe and optimal ADL completion. Supine<>sit completed with MAX A, at least MIN A required for static sitting on edge of bed, SET UP for feeding tasks. Pt noted to be incontinent of urine/BM discussed importance of reduction of moisture for continued improved wound healing. MAX-TOTAL A required for peri care, new bed linens provided with pt rolling with MOD-MAX A. Discussed with pt that if she chooses to discharge home, she would benefit from use of patient care lift, hospital bed, mwc for all mobility. Recommend discharge to STR to continue to address functional deficits. OT will continue to follow acutely.      Recommendations for follow up therapy are one component of a multi-disciplinary discharge planning process, led by the attending physician.  Recommendations may be updated based on patient status, additional functional criteria and insurance  authorization.   Follow Up Recommendations  Skilled nursing-short term rehab (<3 hours/day)     Assistance Recommended at Discharge Frequent or constant Supervision/Assistance  Patient can return home with the following Two people to help with walking and/or transfers;A lot of help with bathing/dressing/bathroom;Assistance with cooking/housework;Assistance with feeding;Direct supervision/assist for medications management;Direct supervision/assist for financial management;Assist for transportation;Help with stairs or ramp for entrance    Functional Status Assessment  Patient has had a recent decline in their functional status and demonstrates the ability to make significant improvements in function in a reasonable and predictable amount of time.  Equipment Recommendations  Wheelchair (measurements OT);Wheelchair cushion (measurements OT);Hospital bed;Other (comment) (patient care lift)    Recommendations for Other Services       Precautions / Restrictions Precautions Precautions: Fall      Mobility Bed Mobility Overal bed mobility: Needs Assistance Bed Mobility: Supine to Sit, Sit to Supine, Rolling Rolling: Mod assist, Max assist   Supine to sit: Max assist, HOB elevated Sit to supine: Max assist, +2 for physical assistance, HOB elevated        Transfers                   General transfer comment: unsafe to attempt on this date with +1      Balance Overall balance assessment: Needs assistance   Sitting balance-Leahy Scale: Poor Sitting balance - Comments: requires at least MIN A for static sitting balance Postural control: Posterior lean                                 ADL either performed or assessed with clinical judgement   ADL Overall ADL's : Needs  assistance/impaired Eating/Feeding: Set up;Sitting           Lower Body Bathing: Maximal assistance;Bed level   Upper Body Dressing : Minimal assistance;Sitting   Lower Body Dressing:  Maximal assistance       Toileting- Clothing Manipulation and Hygiene: Total assistance;Bed level;With adaptive equipment;Maximal assistance Toileting - Clothing Manipulation Details (indicate cue type and reason): incontient of BM/urine             Vision Patient Visual Report: No change from baseline       Perception     Praxis      Pertinent Vitals/Pain Pain Assessment Pain Assessment: Faces Faces Pain Scale: Hurts little more Pain Location: groin wounds Pain Descriptors / Indicators: Grimacing, Guarding, Discomfort Pain Intervention(s): Limited activity within patient's tolerance, Monitored during session, Repositioned     Hand Dominance     Extremity/Trunk Assessment Upper Extremity Assessment Upper Extremity Assessment: Generalized weakness   Lower Extremity Assessment Lower Extremity Assessment: LLE deficits/detail;Generalized weakness LLE Deficits / Details: AKA 08/14/22, scar line looks to have a small scab, team notified and no wrapping performed on this date       Communication     Cognition Arousal/Alertness: Awake/alert Behavior During Therapy: WFL for tasks assessed/performed Overall Cognitive Status: No family/caregiver present to determine baseline cognitive functioning Area of Impairment: Problem solving, Following commands, Awareness                       Following Commands: Follows one step commands consistently, Follows one step commands with increased time   Awareness: Emergent Problem Solving: Slow processing, Decreased initiation, Difficulty sequencing, Requires verbal cues, Requires tactile cues       General Comments  vital signs montiored, appear stable throughout    Exercises Other Exercises Other Exercises: edu re: role of OT, role of rehab, discharge recommendaions, equipment recommendaitons if pt does choose to discharge home, safe ADL completion   Shoulder Instructions      Home Living Family/patient expects to  be discharged to:: Private residence Living Arrangements: Spouse/significant other Available Help at Discharge: Family Type of Home: Mobile home Home Access: Stairs to enter Entrance Stairs-Number of Steps: 6 Entrance Stairs-Rails: Right;Left Home Layout: One level     Bathroom Shower/Tub: Tub/shower unit         Home Equipment: Cane - single Barista (2 wheels);Electric scooter;BSC/3in1 (pt unable to get scooter into house- someone has to lift it up the steps, pt reports it is broken)   Additional Comments: Pt reports unable to manage with home set up- discharged from rehab and ultimately had a fall; electric scooter does not work per patient      Prior Functioning/Environment Prior Level of Function : Needs assist             Mobility Comments: reports mwc use in rehab facility with assist for transfers ADLs Comments: reporst assist for ADLs/iADL from rehab staff        OT Problem List: Decreased strength;Decreased range of motion;Decreased activity tolerance;Impaired balance (sitting and/or standing);Decreased coordination;Decreased cognition;Decreased safety awareness;Decreased knowledge of use of DME or AE;Decreased knowledge of precautions;Impaired UE functional use;Impaired sensation;Pain;Increased edema      OT Treatment/Interventions: Self-care/ADL training;Therapeutic exercise;Energy conservation;Neuromuscular education;DME and/or AE instruction;Manual therapy;Therapeutic activities;Cognitive remediation/compensation;Patient/family education;Balance training    OT Goals(Current goals can be found in the care plan section) Acute Rehab OT Goals Patient Stated Goal: go back to rehab OT Goal Formulation: With patient Time For Goal Achievement: 11/01/22 Potential to  Achieve Goals: Good ADL Goals Pt Will Perform Grooming: sitting;with set-up Pt Will Perform Lower Body Dressing: with supervision;sitting/lateral leans Pt Will Transfer to Toilet: with mod  assist Pt Will Perform Toileting - Clothing Manipulation and hygiene: with mod assist  OT Frequency: Min 2X/week    Co-evaluation              AM-PAC OT "6 Clicks" Daily Activity     Outcome Measure Help from another person eating meals?: A Little Help from another person taking care of personal grooming?: A Little Help from another person toileting, which includes using toliet, bedpan, or urinal?: Total Help from another person bathing (including washing, rinsing, drying)?: A Lot Help from another person to put on and taking off regular upper body clothing?: A Little Help from another person to put on and taking off regular lower body clothing?: A Lot 6 Click Score: 14   End of Session Equipment Utilized During Treatment: Rolling walker (2 wheels) Nurse Communication: Mobility status (team re: L residual limb status)  Activity Tolerance: Patient tolerated treatment well Patient left: in bed;with call bell/phone within reach;with bed alarm set  OT Visit Diagnosis: Unsteadiness on feet (R26.81);Other abnormalities of gait and mobility (R26.89);Muscle weakness (generalized) (M62.81)                Time: [id number] OT Time Calculation (min): 19 min Charges:  OT General Charges $OT Visit: 1 Visit OT Evaluation $OT Eval Moderate Complexity: 1 Mod  Shanon Payor, OTD OTR/L  10/18/22, 3:07 PM

## 2022-10-19 DIAGNOSIS — Z89612 Acquired absence of left leg above knee: Secondary | ICD-10-CM

## 2022-10-19 DIAGNOSIS — W19XXXA Unspecified fall, initial encounter: Secondary | ICD-10-CM | POA: Diagnosis not present

## 2022-10-19 DIAGNOSIS — S31109D Unspecified open wound of abdominal wall, unspecified quadrant without penetration into peritoneal cavity, subsequent encounter: Secondary | ICD-10-CM | POA: Diagnosis not present

## 2022-10-19 DIAGNOSIS — R531 Weakness: Secondary | ICD-10-CM | POA: Diagnosis not present

## 2022-10-19 LAB — GLUCOSE, CAPILLARY
Glucose-Capillary: 78 mg/dL (ref 70–99)
Glucose-Capillary: 172 mg/dL — ABNORMAL HIGH (ref 70–99)
Glucose-Capillary: 200 mg/dL — ABNORMAL HIGH (ref 70–99)
Glucose-Capillary: 182 mg/dL — ABNORMAL HIGH (ref 70–99)

## 2022-10-19 NOTE — Progress Notes (Addendum)
  Progress Note   Patient: Heather [name] [id number] DOB: Apr 15, 1951 DOA: 10/18/2022     0 DOS: the patient was seen and examined on 10/19/2022   Brief hospital course: Kimothy Croke is a 72 y.o. 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  Patient was recently in the hospital for treatment wound infection, antibiotic completed.  She was sent to an SNF, but she signed out.  She came back to the hospital the same day due to a fall and could not ambulate.   Principal Problem:   Cellulitis of right groin Active Problems:   Right groin wound   Hypertension, benign   Coronary artery disease   PAD (peripheral artery disease) (HCC)   Chronic diastolic CHF (congestive heart failure) (HCC)   Generalized weakness   Fall at home, initial encounter   Non-healing left groin open wound   Hx of AKA (above knee amputation), left ([place])   Uncontrolled type 2 diabetes mellitus with hyperglycemia, with long-term current use of insulin (HCC)   Assessment and Plan: Generalized weakness. Fall at home. Status post left AKA. Patient is seen by PT/OT, recommending nursing placement again. Patient now telling me that she has ran out of Medicare days.  When she left the nursing home, she did not have a functional wheelchair.  PT is fitting patient for a prosthetic leg.  Will try for nursing placement again, if not able to, may consider discharge home with a wheelchair, and a prosthetic leg.  Will also set up PT/OT/RN/aids.   Bilateral groin wounds. Completed antibiotics for recent cellulitis, no chronic infection.  Continue wound care.   Uncontrolled type 2 diabetes with hyperglycemia Continue current  regimen.  Coronary artery disease Chronic diastolic congestive heart failure. Stable.   Essential hypertension. Resume home treatment.       Subjective: Patient has no complaint today.  Physical Exam: Vitals:   10/18/22 1525 10/18/22 1943 10/19/22 0423 10/19/22 0820  BP: (!) 140/68 (!) 121/52 115/61 125/60  Pulse: 80 75 66 71  Resp: 18 20 16 16  $ Temp: 99 F (37.2 C) 98.2 F (36.8 C) 97.6 F (36.4 C) 98 F (36.7 C)  TempSrc: Oral Oral Oral Oral  SpO2: 93% 95% 96% 94%  Weight:      Height:       General exam: Appears calm and comfortable  Respiratory system: Clear to auscultation. Respiratory effort normal. Cardiovascular system: S1 & S2 heard, RRR. No JVD, murmurs, rubs, gallops or clicks. No pedal edema. Gastrointestinal system: Abdomen is nondistended, soft and nontender. No organomegaly or masses felt. Normal bowel sounds heard. Central nervous system: Alert and oriented. No focal neurological deficits. Extremities: Left AKA Skin: No rashes, lesions or ulcers Psychiatry: Judgement and insight appear normal. Mood & affect appropriate.    Data Reviewed:  There are no new results to review at this time.  Family Communication: daughter updated  Disposition: Status is: Observation      Time spent: 35 minutes  Author: Sharen Hones, MD 10/19/2022 10:29 AM  For on call review [email or web address].

## 2022-10-19 NOTE — Hospital Course (Signed)
Heather [name] is a 72 y.o. 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  Patient was recently in the hospital for treatment wound infection, antibiotic completed.  She was sent to an SNF, but she signed out.  She came back to the hospital the same day due to a fall and could not ambulate.

## 2022-10-19 NOTE — Progress Notes (Signed)
BP 104/51, HR 66. Order received from Dr Roosevelt Locks to give the coreg and hold the hydralazine

## 2022-10-19 NOTE — Evaluation (Addendum)
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  Clinical Impression  Patient resting in bed, RN at bedside for dressing change/peri-care; supportive husband at beside for visitation.  Patient alert and oriented, follows commands and agreeable to participation with session.  Endorses generalized pain/soreness to back; improved with position change.  Generally weak and deconditioned throughout all extremities; R ankle DF lacking approx 10-12 degrees passively.  Currently requiring mod assist for bed mobility; mod progressive to close supervision for unsupported sitting balance; max assist +1-2 for latera/scoot pivot transfers.  Unsafe/unable to attempt standing at this time.  Patient very eager to participate/progress as able. Would benefit from skilled PT to address above deficits and promote optimal return to PLOF.; recommend transition to acute inpatient rehab upon discharge for high-intensity, post-acute rehab services.         Of note, may benefit from application of ace wrapping vs shrinker to L residual limb.  Will discuss with physician to determine appropriateness.       Recommendations for follow up therapy are one component of a multi-disciplinary discharge  planning process, led by the attending physician.  Recommendations may be updated based on patient status, additional functional criteria and insurance authorization.  Follow Up Recommendations Acute inpatient rehab (3hours/day) Can patient physically be transported by private vehicle: No    Assistance Recommended at Discharge Frequent or constant Supervision/Assistance  Patient can return home with the following  Two people to help with walking and/or transfers;Two people to help with bathing/dressing/bathroom;Help with stairs or ramp for entrance;Assist for transportation;Assistance with Forensic psychologist (measurements PT);Wheelchair cushion (measurements PT);Hospital bed (hoyer lift)  Recommendations for Other Services       Functional Status Assessment Patient has had a recent decline in their functional status and demonstrates the ability to make significant improvements in function in a reasonable and predictable amount of time.     Precautions / Restrictions Precautions Precautions: Fall Restrictions Weight Bearing Restrictions: No      Mobility  Bed Mobility Overal bed mobility: Needs Assistance Bed Mobility: Supine to Sit Rolling: Min assist, Mod assist   Supine to sit: Mod assist          Transfers Overall transfer level: Needs assistance   Transfers: Bed to chair/wheelchair/BSC            Lateral/Scoot Transfers: Max assist, +2 safety/equipment General transfer comment: extensive assist for lift off, lateral movement and weight shift    Ambulation/Gait               General Gait Details: unsafe/unable  Stairs            Wheelchair Mobility    Modified Rankin (Stroke Patients Only)       Balance Overall balance assessment: Needs assistance Sitting-balance support: No upper extremity supported, Feet supported Sitting balance-Leahy Scale: Fair Sitting balance - Comments: min/mod assist  initially, progressing to close sup with  accommodation to midline/upright                                     Pertinent Vitals/Pain Pain Assessment Pain Assessment: Faces Faces Pain Scale: Hurts little more Pain Location: back Pain Descriptors / Indicators: Guarding, Grimacing Pain Intervention(s): Limited activity within patient's tolerance, Monitored during session, Repositioned    Home Living Family/patient expects to be discharged to:: Private residence Living Arrangements: Spouse/significant other Available Help at Discharge: Family Type of Home: Mobile home Home Access: Stairs to enter Entrance Stairs-Rails: Psychiatric nurse of Steps: 6   Home Layout: One level Home Equipment: Cane - single Barista (2 wheels);Electric scooter;BSC/3in1 Additional Comments: Pt reports unable to manage with home set up- discharged from rehab and ultimately had a fall; electric scooter does not work per patient    Prior Function Prior Level of Function : Needs assist             Mobility Comments: reports mwc use in rehab facility with assist for transfers (using SB); was working towards standing for short spells, unable to attempt any step/hop       Hand Dominance   Dominant Hand: Right    Extremity/Trunk Assessment   Upper Extremity Assessment Upper Extremity Assessment: Generalized weakness    Lower Extremity Assessment Lower Extremity Assessment: Generalized weakness (R LE grossly 4-/5 throughout, R ankle lacking approx 10-12 degrees of passive DF.  L hip grossly WFL, hip ext to neutral)       Communication   Communication: No difficulties  Cognition Arousal/Alertness: Awake/alert Behavior During Therapy: WFL for tasks assessed/performed Overall Cognitive Status: Within Functional Limits for tasks assessed                                          General Comments      Exercises Other Exercises Other  Exercises: Unsupported sitting, participated with weight shifting activities to optimize midline orientation, min/mod progressing to close supervision Other Exercises: Educated in desensitization techniques to L LE; patient voiced understanding.  Will discuss wrapping/limb care with team as appropriate.   Assessment/Plan    PT Assessment Patient needs continued PT services  PT Problem List Decreased range of motion;Decreased mobility;Decreased knowledge of use of DME;Decreased safety awareness;Decreased activity tolerance;Decreased strength       PT Treatment Interventions DME instruction;Functional mobility training;Balance training;Patient/family education;Therapeutic activities;Neuromuscular re-education;Wheelchair mobility training;Therapeutic exercise;Cognitive remediation    PT Goals (Current goals can be found in the Care Plan section)  Acute Rehab PT Goals Patient Stated Goal: to get stronger and be able to transfer better PT Goal Formulation: With patient Time For Goal Achievement: 10/19/22 Potential to Achieve Goals: Fair    Frequency Min 2X/week     Co-evaluation               AM-PAC PT "6 Clicks" Mobility  Outcome Measure Help needed turning from your back to your side while in a flat bed without using bedrails?: A Lot Help needed moving from lying on your back to sitting on the side of a flat bed without using bedrails?: A Lot Help needed moving to and from a bed to a chair (including a wheelchair)?: Total Help needed standing up from a chair using your arms (e.g., wheelchair or bedside chair)?: Total Help needed to walk in hospital  room?: Total Help needed climbing 3-5 steps with a railing? : Total 6 Click Score: 8    End of Session   Activity Tolerance: Patient tolerated treatment well Patient left: in chair;with call bell/phone within reach;with family/visitor present Nurse Communication: Mobility status PT Visit Diagnosis: Muscle weakness (generalized)  (M62.81);Other abnormalities of gait and mobility (R26.89)    Time: [id number] PT Time Calculation (min) (ACUTE ONLY): 27 min   Charges:   PT Evaluation $PT Eval Moderate Complexity: 1 Mod PT Treatments $Therapeutic Activity: 8-22 mins        Natale Barba H. Owens Shark, PT, DPT, NCS 10/19/22, 11:41 AM [phone]

## 2022-10-19 NOTE — TOC Progression Note (Addendum)
Transition of Care [facility]) - Progression Note    Patient Details  Name: Heather [name] MRN: [id number] Date of Birth: 01-Oct-1950  Transition of Care [facility]) CM/SW Contact  Izola Price, RN Phone Number: 10/19/2022, 12:11 PM  Clinical Narrative: 2/25: [facility] consult for SNF but PT now recommending CIR. Left AKA shrinker recommended by PT and ordered by ortho tech today from [facility]. Patient has been at a SNF [facility] per CM DC note of 10/01/22 from [place]) and left but returned to hospital due a fall, slid out of recliner after returning home, weakness, and inability to ambulate. Dx with a RIGHT groin infection following vascular procedure/access per history notes. Left AKA 08/14/22. Htx of stroke on December 2023 admission. Pending CIR eval and shrinker for LEFT AKA.  [name] (brother) [phone] was added as a contact not listed by ED RN. Simmie Davies RN CM         Expected Discharge Plan and Services                                               Social Determinants of Health (SDOH) Interventions SDOH Screenings   Food Insecurity: No Food Insecurity (10/18/2022)  Recent Concern: Food Insecurity - Food Insecurity Present (08/11/2022)  Housing: Low Risk  (10/18/2022)  Transportation Needs: No Transportation Needs (10/18/2022)  Utilities: Not At Risk (10/18/2022)  Financial Resource Strain: Medium Risk (10/19/2018)  Physical Activity: Inactive (10/19/2018)  Social Connections: Unknown (10/19/2018)  Tobacco Use: High Risk (10/17/2022)    Readmission Risk Interventions    11/13/2020    2:27 PM  Readmission Risk Prevention Plan  Transportation Screening Complete  PCP or Specialist Appt within 3-5 Days Complete  HRI or [place] Complete  Social Work Consult for [place] Planning/Counseling Complete  Palliative Care Screening Not Applicable  Medication Review Press photographer) Complete

## 2022-10-19 NOTE — Progress Notes (Signed)
Orthopedic Tech Progress Note Patient Details:  Heather [name] [birth year] [id number] [place] Shrinker has been ordered from [facility]  Patient ID: [name], female   DOB: 09/12/1950, 72 y.o.   MRN: [id number]  Jearld Lesch 10/19/2022, 11:52 AM

## 2022-10-20 ENCOUNTER — Ambulatory Visit: Payer: Medicare HMO | Admitting: Cardiology

## 2022-10-20 DIAGNOSIS — E1165 Type 2 diabetes mellitus with hyperglycemia: Secondary | ICD-10-CM | POA: Diagnosis not present

## 2022-10-20 DIAGNOSIS — I5032 Chronic diastolic (congestive) heart failure: Secondary | ICD-10-CM

## 2022-10-20 DIAGNOSIS — Z794 Long term (current) use of insulin: Secondary | ICD-10-CM

## 2022-10-20 DIAGNOSIS — S31109D Unspecified open wound of abdominal wall, unspecified quadrant without penetration into peritoneal cavity, subsequent encounter: Secondary | ICD-10-CM | POA: Diagnosis not present

## 2022-10-20 LAB — GLUCOSE, CAPILLARY
Glucose-Capillary: 128 mg/dL — ABNORMAL HIGH (ref 70–99)
Glucose-Capillary: 185 mg/dL — ABNORMAL HIGH (ref 70–99)
Glucose-Capillary: 190 mg/dL — ABNORMAL HIGH (ref 70–99)
Glucose-Capillary: 150 mg/dL — ABNORMAL HIGH (ref 70–99)

## 2022-10-20 MED ORDER — PIPERACILLIN-TAZOBACTAM 3.375 G IVPB
3.3750 g | Freq: Three times a day (TID) | INTRAVENOUS | Status: DC
  Administered 2022-10-20 – 2022-10-22 (×6): 3.375 g via INTRAVENOUS
  Filled 2022-10-20 (×6): qty 50

## 2022-10-20 MED ORDER — CHLORHEXIDINE GLUCONATE CLOTH 2 % EX PADS
6.0000 | MEDICATED_PAD | Freq: Every day | CUTANEOUS | Status: DC
  Administered 2022-10-20 – 2022-10-22 (×3): 6 via TOPICAL

## 2022-10-20 NOTE — Progress Notes (Deleted)
Cardiology Clinic Note   Patient Name: Heather [name] Date of Encounter: 10/20/2022  Primary Care Provider:  [name], [place] Primary Cardiologist:  Ida Rogue, MD  Patient Profile    72 year old female with a past medical history of severe AI and AAS status post TAVR in 09/2016, current smoker 1/2 pack/day, coronary artery disease with PCI to the mid LAD in May 2017, repeat cath prior to TAVR showed 50% in-stent restenosis of the mid LAD stent, peripheral arterial disease, COPD, occlusive disease to the bilateral lower extremities with rest pain followed by vascular, who is here today to follow-up on her coronary artery disease.  Past Medical History    Past Medical History:  Diagnosis Date   (HFpEF) heart failure with preserved ejection fraction (HCC)    Angina pectoris ([place])    Anxiety    Aortic atherosclerosis ([place])    Basal cell carcinoma    Bilateral carotid artery disease ([place])    a.) carotid doppler 03/18/2021: [id number] RICA, [id number] LICA   Bipolar disorder (HCC)    Cervicalgia    Chronic mesenteric ischemia (HCC)    a.) s/p PTA 11/13/2020 --> 6 x 16 mm lifestream ballon expanding stent to SMA   COPD (chronic obstructive pulmonary disease) (HCC)    Coronary artery disease 03/20/2014   a.) LHC/PCI 03/20/2014: 70% mLAD (2.5 x 28 mm Xience Alpine DES), 70% dLCx, 70% dRCA; b.) LHC 01/25/2016: 90% ISR mLAD --> mLAD dissection with in stent --> 2.75 x 22 mm Resolute DES; c.) R/LHC 09/29/2016: 50% ISR mLAD, 70% dLAD, mPA 37, PCWP 24, AO sat 90%, PA sat 46%, CO 3.7, CI 2.46 - med mgmt   DDD (degenerative disc disease), cervical    Depression    Diabetic polyneuropathy (HCC)    GERD (gastroesophageal reflux disease)    Hyperlipidemia    Hypertension    Long term current use of antithrombotics/antiplatelets    a.) on DAPT (ASA + clopidogrel)   Memory loss    NSTEMI (non-ST elevated myocardial infarction) ([place]) 07/2016   a.) troponins trended (normal < 0.034 ng/mL): 0.325  --> 1.480 --> 2.330 --> 1.660 --> 0.169 ng/mL   PAD (peripheral artery disease) ([place])    a.) s/p PTA and stenting SMA 11/13/2020; b.) s/p PTA and stenting of RIGHT SFA and popliteal 04/12/2021; c.) s/p PTA and kissing balloon stents to BILATERAL CIAs 05/28/2021   Respiratory failure with hypoxia (HCC)    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   a.) 23 mm Sapien 3 via suprasternal approach   Schizophrenia ([place])    Severe aortic stenosis    a.) s/p TAVR 09/30/2016; 23 mm Sapien 3 via a suprasternal approach   Type 2 diabetes mellitus treated with insulin ([place])    Vertigo    Past Surgical History:  Procedure Laterality Date   AMPUTATION Left 08/14/2022   Procedure: ABOVE KNEE AMPUTATION;  Surgeon: Waynetta Sandy, MD;  Location: [place];  Service: Vascular;  Laterality: Left;   AORTIC VALVE REPLACEMENT  09/30/2016   Procedure: TRANSCATHETER AORTIC VALVE REPAIR   APPLICATION OF WOUND VAC Bilateral 08/14/2022   Procedure: APPLICATION OF WOUND VAC;  Surgeon: Waynetta Sandy, MD;  Location: [place];  Service: Vascular;  Laterality: Bilateral;   BLADDER SURGERY     CATARACT EXTRACTION, BILATERAL     CHOLECYSTECTOMY     COLONOSCOPY     CORONARY ANGIOPLASTY WITH STENT PLACEMENT  03/20/2014   Procedure: CORONARY ANGIOPLASTY WITH STENT PLACEMENT; Location: [facility]; Surgeon:  Jacques Earthly, MD   CORONARY ANGIOPLASTY WITH STENT PLACEMENT Left 01/25/2016   Procedure: CORONARY ANGIOPLASTY WITH STENT PLACEMENT; Location: [facility]; Surgeon: Jacques Earthly, MD   ENDARTERECTOMY FEMORAL Bilateral 08/04/2022   Procedure: BILATERAL FEMORAL ENDARTERECTOMY;  Surgeon: Waynetta Sandy, MD;  Location: [place];  Service: Vascular;  Laterality: Bilateral;   FASCIECTOMY Bilateral 08/04/2022   Procedure: BILATERAL LOWER EXTREMITY FASCIECTOMIES;  Surgeon: Waynetta Sandy, MD;  Location: [place];  Service: Vascular;  Laterality: Bilateral;   FOOT SURGERY Right    GROIN  DEBRIDEMENT Bilateral 08/14/2022   Procedure: GROIN DEBRIDEMENT;  Surgeon: Waynetta Sandy, MD;  Location: [place];  Service: Vascular;  Laterality: Bilateral;   GROIN DEBRIDEMENT Bilateral 09/16/2022   Procedure: GROIN DEBRIDEMENT POSSIBLE VAC PLACMENT;  Surgeon: Broadus John, MD;  Location: [place];  Service: Vascular;  Laterality: Bilateral;   HEMORRHOID SURGERY     INSERTION OF ILIAC STENT Bilateral 08/04/2022   Procedure: INSERTION OF BILATERAL ILIAC STENTS;  Surgeon: Waynetta Sandy, MD;  Location: [place];  Service: Vascular;  Laterality: Bilateral;   IRRIGATION AND DEBRIDEMENT HEMATOMA Left 07/08/2014   GROIN   LOWER EXTREMITY ANGIOGRAM Bilateral 08/04/2022   Procedure: LOWER EXTREMITY ANGIOGRAM;  Surgeon: Waynetta Sandy, MD;  Location: [place];  Service: Vascular;  Laterality: Bilateral;   LOWER EXTREMITY ANGIOGRAPHY Right 04/12/2021   Procedure: Lower Extremity Angiography;  Surgeon: [name], MD;  Location: [place] [facility];  Service: Cardiovascular;  Laterality: Right;   LOWER EXTREMITY ANGIOGRAPHY Left 05/28/2021   Procedure: LOWER EXTREMITY ANGIOGRAPHY;  Surgeon: [name], MD;  Location: [place] [facility];  Service: Cardiovascular;  Laterality: Left;   LOWER EXTREMITY ANGIOGRAPHY Left 03/25/2022   Procedure: Lower Extremity Angiography;  Surgeon: [name], MD;  Location: [place] [facility];  Service: Cardiovascular;  Laterality: Left;   PATCH ANGIOPLASTY  08/04/2022   Procedure: LEFT FEMORAL PATCH ANGIOPLASTY USING XENOSURE BIOLOGIC PATCH;  Surgeon: Waynetta Sandy, MD;  Location: [place];  Service: Vascular;;   PSEUDOANEURYSM REPAIR Left 06/30/2014   FEMORAL ARTERY   RIGHT/LEFT HEART CATH AND CORONARY ANGIOGRAPHY Bilateral 09/29/2016   Procedure: RIGHT/LEFT HEART CATH AND CORONARY ANGIOGRAPHY; Location [facility]; Surgeon: Jacques Earthly, MD   THROMBECTOMY FEMORAL ARTERY Bilateral 08/04/2022   Procedure:  BILATERAL LOWER EXTREMITY THROMBECTOMY;  Surgeon: Waynetta Sandy, MD;  Location: [place];  Service: Vascular;  Laterality: Bilateral;   TUBAL LIGATION     VISCERAL ANGIOGRAPHY N/A 11/13/2020   Procedure: VISCERAL ANGIOGRAPHY;  Surgeon: [name], MD;  Location: [place] [facility];  Service: Cardiovascular;  Laterality: N/A;    Allergies  Allergies  Allergen Reactions   Iodinated Contrast Media Hives   Sulfa Antibiotics Hives   Shellfish Allergy Nausea And Vomiting and Rash    Scallops specifically     History of Present Illness    Heather [name] is a 72 year old female with the previously mentioned past medical history.   Home Medications    No current facility-administered medications for this visit.   No current outpatient medications on file.   Facility-Administered Medications Ordered in Other Visits  Medication Dose Route Frequency Provider Last Rate Last Admin   acetaminophen (TYLENOL) tablet 650 mg  650 mg Oral Q6H PRN Mansy, Jan A, MD       Or   acetaminophen (TYLENOL) suppository 650 mg  650 mg Rectal Q6H PRN Mansy, Jan A, MD       albuterol (PROVENTIL) (2.5 MG/3ML) 0.083% nebulizer solution  2.5 mg  2.5 mg Nebulization Q4H PRN Renda Rolls, RPH       amLODipine (NORVASC) tablet 10 mg  10 mg Oral Daily Mansy, Jan A, MD   10 mg at 10/20/22 0850   atorvastatin (LIPITOR) tablet 80 mg  80 mg Oral QHS Mansy, Jan A, MD   80 mg at 10/19/22 2049   carvedilol (COREG) tablet 6.25 mg  6.25 mg Oral BID WC Mansy, Jan A, MD   6.25 mg at 10/20/22 0850   Chlorhexidine Gluconate Cloth 2 % PADS 6 each  6 each Topical Q0600 Sharen Hones, MD   6 each at 10/20/22 0851   cholecalciferol (VITAMIN D3) 25 MCG (1000 UNIT) tablet 1,000 Units  1,000 Units Oral Daily Mansy, Jan A, MD   1,000 Units at 10/20/22 0850   clopidogrel (PLAVIX) tablet 75 mg  75 mg Oral Daily Mansy, Jan A, MD   75 mg at 10/20/22 0850   docusate sodium (COLACE) capsule 100 mg  100 mg Oral Daily Mansy, Jan  A, MD   100 mg at 10/20/22 0851   dronabinol (MARINOL) capsule 2.5 mg  2.5 mg Oral BID AC Mansy, Jan A, MD   2.5 mg at 10/19/22 1707   enoxaparin (LOVENOX) injection 40 mg  40 mg Subcutaneous Q24H Mansy, Jan A, MD   40 mg at 10/20/22 0850   escitalopram (LEXAPRO) tablet 10 mg  10 mg Oral Daily Mansy, Jan A, MD   10 mg at 10/20/22 0852   fluticasone (FLONASE) 50 MCG/ACT nasal spray 1 spray  1 spray Each Nare Daily PRN Mansy, Jan A, MD       hydrALAZINE (APRESOLINE) tablet 50 mg  50 mg Oral TID Mansy, Jan A, MD   50 mg at 10/20/22 0851   insulin aspart (novoLOG) injection 0-9 Units  0-9 Units Subcutaneous TID AC & HS Sharion Settler, NP   1 Units at 10/20/22 0849   insulin glargine-yfgn [facility]) injection 10 Units  10 Units Subcutaneous BID Mansy, Jan A, MD   10 Units at 10/20/22 0853   levETIRAcetam (KEPPRA) tablet 500 mg  500 mg Oral BID Mansy, Jan A, MD   500 mg at 10/20/22 0850   loratadine (CLARITIN) tablet 10 mg  10 mg Oral Daily Mansy, Jan A, MD   10 mg at 10/20/22 0850   magnesium hydroxide (MILK OF MAGNESIA) suspension 30 mL  30 mL Oral Daily PRN Mansy, Jan A, MD       mirtazapine (REMERON) tablet 7.5 mg  7.5 mg Oral QHS Mansy, Jan A, MD   7.5 mg at 10/19/22 2049   mometasone-formoterol (DULERA) 200-5 MCG/ACT inhaler 2 puff  2 puff Inhalation BID Mansy, Jan A, MD   2 puff at 10/20/22 0854   multivitamin with minerals tablet 1 tablet  1 tablet Oral Daily Mansy, Jan A, MD       ondansetron [facility]) tablet 4 mg  4 mg Oral Q6H PRN Mansy, Jan A, MD       Or   ondansetron [facility]) injection 4 mg  4 mg Intravenous Q6H PRN Mansy, Jan A, MD       oxyCODONE-acetaminophen (PERCOCET/ROXICET) 5-325 MG per tablet 1 tablet  1 tablet Oral Q6H PRN Sharen Hones, MD   1 tablet at 10/20/22 0858   pantoprazole (PROTONIX) EC tablet 40 mg  40 mg Oral Daily Mansy, Jan A, MD   40 mg at 10/20/22 0851   pneumococcal 20-valent conjugate vaccine (PREVNAR 20) injection 0.5 mL  0.5 mL Intramuscular Prior to discharge  Mansy, Jan A, MD       polyethylene glycol (MIRALAX / GLYCOLAX) packet 17 g  17 g Oral Daily Mansy, Jan A, MD   17 g at 10/18/22 [id number]   senna-docusate (Senokot-S) tablet 1 tablet  1 tablet Oral Daily Mansy, Jan A, MD   1 tablet at 10/20/22 0850   traMADol (ULTRAM) tablet 50 mg  50 mg Oral Q8H PRN Mansy, Jan A, MD   50 mg at 10/19/22 0316   traZODone (DESYREL) tablet 25 mg  25 mg Oral QHS PRN Mansy, Arvella Merles, MD         Family History    Family History  Problem Relation Age of Onset   Mental illness Mother    Breast cancer Neg Hx    She indicated that her mother is deceased. She indicated that her father is deceased. She indicated that the status of her neg hx is unknown.  Social History    Social History   Socioeconomic History   Marital status: Married    Spouse name: [name]   Number of children: 2   Years of education: Not on file   Highest education level: High school graduate  Occupational History   Not on file  Tobacco Use   Smoking status: Every Day    Packs/day: 1.50    Years: 48.00    Total pack years: 72.00    Types: Cigarettes   Smokeless tobacco: Never  Vaping Use   Vaping Use: Never used  Substance and Sexual Activity   Alcohol use: Not Currently   Drug use: Not Currently   Sexual activity: Not Currently  Other Topics Concern   Not on file  Social History Narrative   Lives at home with [employer]   Social Determinants of Health   Financial Resource Strain: Medium Risk (10/19/2018)   Overall Financial Resource Strain (CARDIA)    Difficulty of Paying Living Expenses: Somewhat hard  Food Insecurity: No Food Insecurity (10/18/2022)   Hunger Vital Sign    Worried About Running Out of Food in the Last Year: Never true    [facility] in the Last Year: Never true  Recent Concern: Food Insecurity - Food Insecurity Present (08/11/2022)   Hunger Vital Sign    Worried About [place] in the Last Year: Sometimes true    Ran Out of Food in the Last Year:  Sometimes true  Transportation Needs: No Transportation Needs (10/18/2022)   PRAPARE - Hydrologist (Medical): No    Lack of Transportation (Non-Medical): No  Physical Activity: Inactive (10/19/2018)   Exercise Vital Sign    Days of Exercise per Week: 0 days    Minutes of Exercise per Session: 0 min  Stress: Not on file  Social Connections: Unknown (10/19/2018)   Social Connection and Isolation Panel [NHANES]    Frequency of Communication with Friends and Family: Not on file    Frequency of Social Gatherings with Friends and Family: Not on file    Attends Religious Services: More than 4 times per year    Active Member of [employer] or Organizations: No    Attends Archivist Meetings: Never    Marital Status: Married  Human resources officer Violence: Not At Risk (10/18/2022)   Humiliation, Afraid, Rape, and Kick questionnaire    Fear of Current or Ex-Partner: No    Emotionally Abused: No    Physically Abused: No  Sexually Abused: No     Review of Systems    General:  No chills, fever, night sweats or weight changes.  Cardiovascular:  No chest pain, dyspnea on exertion, edema, orthopnea, palpitations, paroxysmal nocturnal dyspnea. Dermatological: No rash, lesions/masses Respiratory: No cough, dyspnea Urologic: No hematuria, dysuria Abdominal:   No nausea, vomiting, diarrhea, bright red blood per rectum, melena, or hematemesis Neurologic:  No visual changes, wkns, changes in mental status. All other systems reviewed and are otherwise negative except as noted above.     Physical Exam    VS:  There were no vitals taken for this visit. , BMI There is no height or weight on file to calculate BMI.     GEN: Well nourished, well developed, in no acute distress. HEENT: normal. Neck: Supple, no JVD, carotid bruits, or masses. Cardiac: RRR, no murmurs, rubs, or gallops. No clubbing, cyanosis, edema.  Radials 2+/PT 2+ and equal bilaterally.  Respiratory:   Respirations regular and unlabored, clear to auscultation bilaterally. GI: Soft, nontender, nondistended, BS + x 4. MS: no deformity or atrophy. Skin: warm and dry, no rash. Neuro:  Strength and sensation are intact. Psych: Normal affect.  Accessory Clinical Findings    ECG personally reviewed by me today- *** - No acute changes  Lab Results  Component Value Date   WBC 6.8 10/18/2022   HGB 9.6 (L) 10/18/2022   HCT 30.5 (L) 10/18/2022   MCV 90.0 10/18/2022   PLT 412 (H) 10/18/2022   Lab Results  Component Value Date   CREATININE 0.65 10/18/2022   BUN 14 10/18/2022   NA 138 10/18/2022   K 3.5 10/18/2022   CL 105 10/18/2022   CO2 27 10/18/2022   Lab Results  Component Value Date   ALT 9 10/01/2022   AST 16 10/01/2022   ALKPHOS 62 10/01/2022   BILITOT <0.1 (L) 10/01/2022   Lab Results  Component Value Date   CHOL 96 08/05/2022   HDL 25 (L) 08/05/2022   LDLCALC UNABLE TO CALCULATE IF TRIGLYCERIDE OVER 400 mg/dL 08/05/2022   LDLDIRECT 34 08/05/2022   TRIG 531 (H) 08/05/2022   TRIG 535 (H) 08/05/2022   CHOLHDL 3.8 08/05/2022    Lab Results  Component Value Date   HGBA1C 12.9 (H) 08/05/2022    Assessment & Plan   1.  ***  Jamye Balicki, NP 10/20/2022, 11:44 AM

## 2022-10-20 NOTE — Progress Notes (Signed)
Inpatient Rehab Admissions Coordinator:   Per therapy recommendations, patient was screened for CIR candidacy by Joia Doyle, MS, CCC-SLP. At this time, Pt. does not appear to demonstrate medical necessity to justify in hospital rehabilitation/CIR. will not pursue a rehab consult for this Pt.   Recommend other rehab venues to be pursued.  Please contact me with any questions.  Amma Crear, MS, CCC-SLP Rehab Admissions Coordinator  [phone] (celll) [phone] (office)   

## 2022-10-20 NOTE — Progress Notes (Addendum)
PHARMACY CONSULT NOTE FOR:  OUTPATIENT  PARENTERAL ANTIBIOTIC THERAPY (OPAT) Continuation of previous OPAT orders being followed by [facility] in [facility] (Dr West Bali)  Indication: Polymicrobial ground wound s/p recent vascular surgery with grafts  Regimen: piperacillin/tazobactam 3.375gm IV q6h over 30 minute infusion End date: 10/28/2022  Per Dr West Bali (ID physician at [facility]) patient to start ciprofloxacin '500mg'$  PO BID and metronidazole '500mg'$  PO BID on 10/29/2022 for suppressive therapy.      IV antibiotic discharge orders are pended. To discharging provider:  please sign these orders via discharge navigator,  Select New Orders & click on the button choice - Manage This Unsigned Work.     Thank you for allowing pharmacy to be a part of this patient's care.  Doreene Eland, PharmD, BCPS, BCIDP Work Cell: [phone] 10/20/2022 1:22 PM

## 2022-10-20 NOTE — Progress Notes (Signed)
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   PT Comments    Pt received upright in bed agreeable to PT services. Pt requiring less physical, 2 person assist this date with improved sitting tolerance and transfer ability using sliding board. Pt able to articulate process of using sliding board safely but needing 2 person assist for L truncal lean for placement under R buttocks.  Initially minA+2 to place slide board and scoot onto slide board then reliant on modA +1 to complete transfer with multimodal cuing for UE and RLE sequencing and lift off. +[id number] to position upright in recliner once seated. Pt tolerating LE therex fairly, clearly appearing fatigue with increased RR rate. Re-educated pt on shrinker use and LLE positioning to prevent joint contracture. Pt with all needs in reach with RN at bedside. Continue to recommend additional rehab at discharge to reduce care giver burden, falls risk, and optimize independence.    Recommendations for follow up therapy are one component of a multi-disciplinary discharge planning process, led by the attending physician.  Recommendations may be updated based on patient status, additional functional criteria and insurance  authorization.  Follow Up Recommendations  Acute inpatient rehab (3hours/day) Can patient physically be transported by private vehicle: No   Assistance Recommended at Discharge Frequent or constant Supervision/Assistance  Patient can return home with the following Two people to help with walking and/or transfers;Two people to help with bathing/dressing/bathroom;Help with stairs or ramp for entrance;Assist for transportation;Assistance with cEducation officer, environmental(measurements PT);Wheelchair cushion (measurements PT);Hospital bed;Other (comment) (hoyer lift)    Recommendations for Other Services       Precautions / Restrictions Precautions Precautions: Fall Restrictions Weight Bearing Restrictions: No     Mobility  Bed Mobility Overal bed mobility: Needs Assistance Bed Mobility: Supine to Sit     Supine to sit: Mod assist, HOB elevated       Patient Response: Cooperative  Transfers Overall transfer level: Needs assistance   Transfers: Bed to chair/wheelchair/BSC            Lateral/Scoot Transfers: Mod assist, Min assist, +2 physical assistance General transfer comment: minA+2 to initiate buttocks onto sliding board. ModA+1 for glut lift off sliding from bed to chair. Pt able to use R foot to assist.    Ambulation/Gait               General Gait Details: unsafe/unable   Stairs             Wheelchair Mobility    Modified Rankin (Stroke Patients Only)       Balance Overall balance assessment: Needs assistance Sitting-balance support: No upper extremity supported, Feet supported Sitting balance-Leahy Scale: Fair Sitting balance - Comments: able to reach outside BOS without SUE support on bed with close supervision. Does require min to modA to reach mid line and position LE's initially.  Cognition Arousal/Alertness: Awake/alert Behavior During Therapy: WFL  for tasks assessed/performed Overall Cognitive Status: Within Functional Limits for tasks assessed                                          Exercises General Exercises - Lower Extremity Hip ABduction/ADduction: AROM, Strengthening, Left, 10 reps, Supine Straight Leg Raises: AROM, AAROM, Strengthening, Both, 10 reps, Supine Other Exercises Other Exercises: x5 forward reaches outside BOS/ UE. Encouraged donning shrinker for 2-3 hours at a time    General Comments        Pertinent Vitals/Pain Pain Assessment Pain Assessment: Faces Faces Pain Scale: Hurts little more Pain Location: back Pain Descriptors / Indicators: Guarding, Grimacing Pain Intervention(s): Limited activity within patient's tolerance, Monitored during session, Repositioned    Home Living                          Prior Function            PT Goals (current goals can now be found in the care plan section) Acute Rehab PT Goals Patient Stated Goal: to get stronger and be able to transfer better PT Goal Formulation: With patient Time For Goal Achievement: 10/19/22 Potential to Achieve Goals: Fair Progress towards PT goals: Progressing toward goals    Frequency    Min 2X/week      PT Plan Current plan remains appropriate    Co-evaluation              AM-PAC PT "6 Clicks" Mobility   Outcome Measure  Help needed turning from your back to your side while in a flat bed without using bedrails?: A Lot Help needed moving from lying on your back to sitting on the side of a flat bed without using bedrails?: A Lot Help needed moving to and from a bed to a chair (including a wheelchair)?: A Lot Help needed standing up from a chair using your arms (e.g., wheelchair or bedside chair)?: Total Help needed to walk in hospital room?: Total Help needed climbing 3-5 steps with a railing? : Total 6 Click Score: 9    End of Session Equipment Utilized During Treatment: Gait belt;Other  (comment) (sliding board) Activity Tolerance: Patient tolerated treatment well Patient left: in chair;with call bell/phone within reach;with family/visitor present Nurse Communication: Mobility status PT Visit Diagnosis: Muscle weakness (generalized) (M62.81);Other abnormalities of gait and mobility (R26.89)     Time: [id number] PT Time Calculation (min) (ACUTE ONLY): 25 min  Charges:  $Therapeutic Exercise: 8-22 mins $Therapeutic Activity: 8-22 mins                     Lukasz Rogus M. Fairly IV, PT, DPT Physical Therapist- [facility]  10/20/2022, 1:18 PM

## 2022-10-20 NOTE — TOC Initial Note (Addendum)
Transition of Care [facility]) - Initial/Assessment Note    Patient Details  Name: Heather [name] MRN: [id number] Date of Birth: [birth year]  [facility]) CM/SW Contact:    Candie Chroman, LCSW Phone Number: 10/20/2022, 10:07 AM  Clinical Narrative:   This CSW working remote today. Called patient in the room, introduced role, and explained that therapy recommendations would be discussed. Per CIR admissions coordinator, "Pt. does not appear to demonstrate medical necessity to justify in hospital rehabilitation/CIR." Patient is agreeable to SNF placement. She was at [facility] 1/29-2/3 and 2/6-2/7 and then went to [facility] 2/12-2/23. She has used about 17 days. Discussed copays with patient. She agreed that we need to find a facility that can work out a payment plan after rehab. Patient wants to stay in [place]. Sent referral to all facilities in [place]. No further concerns. CSW encouraged patient to contact CSW as needed. CSW will continue to follow patient for support and facilitate discharge to SNF once medically stable.            11:59 am: No bed offers so far. Expanded search.     3:48 pm: Called patient to discuss bed offers. Asked RN to print off scores for her to review. Will follow up with decision tomorrow.  Expected Discharge Plan: Skilled Nursing Facility Barriers to Discharge: Ship broker, Continued Medical Work up   Patient Goals and CMS Choice            Expected Discharge Plan and Services     Post Acute Care Choice: [place] Living arrangements for the past 2 months: [place]                                      Prior Living Arrangements/Services Living arrangements for the past 2 months: Single Family Home   Patient language and need for interpreter reviewed:: Yes Do you feel safe going back to the place where you live?: Yes      Need for Family Participation in Patient  Care: Yes (Comment) Care giver support system in place?: Yes (comment)   Criminal Activity/Legal Involvement Pertinent to Current Situation/Hospitalization: No - Comment as needed  Activities of Daily Living Home Assistive Devices/Equipment: None ADL Screening (condition at time of admission) Patient's cognitive ability adequate to safely complete daily activities?: Yes Is the patient deaf or have difficulty hearing?: No Does the patient have difficulty seeing, even when wearing glasses/contacts?: No Does the patient have difficulty concentrating, remembering, or making decisions?: No Patient able to express need for assistance with ADLs?: Yes Does the patient have difficulty dressing or bathing?: Yes Independently performs ADLs?: No Communication: Independent Dressing (OT): Needs assistance Is this a change from baseline?: Pre-admission baseline Grooming: Needs assistance Is this a change from baseline?: Pre-admission baseline Feeding: Independent Bathing: Needs assistance Is this a change from baseline?: Pre-admission baseline Toileting: Needs assistance Is this a change from baseline?: Pre-admission baseline In/Out Bed: Needs assistance Is this a change from baseline?: Pre-admission baseline Walks in Home: Needs assistance Is this a change from baseline?: Pre-admission baseline Does the patient have difficulty walking or climbing stairs?: Yes Weakness of Legs: Right Weakness of Arms/Hands: Both  Permission Sought/Granted Permission sought to share information with : Facility Art therapist granted to share information with : Yes, Verbal Permission Granted     Permission granted to share info w AGENCY: SNF's  Emotional Assessment Appearance:: Appears stated age Attitude/Demeanor/Rapport: Engaged, Gracious Affect (typically observed): Accepting, Appropriate, Calm, Pleasant Orientation: : Oriented to Self, Oriented to Place, Oriented to  Time,  Oriented to Situation Alcohol / Substance Use: Not Applicable Psych Involvement: No (comment)  Admission diagnosis:  Cellulitis of groin [L03.314] Cellulitis of right groin [L03.314] Generalized weakness [R53.1] Patient Active Problem List   Diagnosis Date Noted   Cellulitis of right groin 10/18/2022   Generalized weakness 10/18/2022   Fall at home, initial encounter 10/18/2022   Non-healing left groin open wound 10/18/2022   Hx of AKA (above knee amputation), left ([place]) 10/18/2022   Uncontrolled type 2 diabetes mellitus with hyperglycemia, with long-term current use of insulin ([place]) 10/18/2022   Failure to thrive in adult [id number]   Acute metabolic encephalopathy [id number]   Normocytic anemia 10/01/2022   Acute encephalopathy 09/28/2022   Surgical site infection 09/15/2022   Decreased oral intake 09/13/2022   Chronic diastolic CHF (congestive heart failure) ([place]) 09/13/2022   Dysphagia 09/13/2022   PVD (peripheral vascular disease) ([place]) 09/11/2022   Right groin wound 09/10/2022   Wound of left groin 09/10/2022   Pulmonary edema 08/16/2022   Abscess of anal and rectal regions 08/15/2022   Decubitus ulcer of sacral region, stage 2 ([place]) 08/15/2022   ICH (intracerebral hemorrhage) ([place]) 08/04/2022   Atherosclerosis of native arteries of extremity with rest pain ([place]) 04/02/2022   PAD (peripheral artery disease) ([place]) 04/11/2021   Ischemic leg pain 04/11/2021   Chronic mesenteric ischemia ([place]) 12/17/2020   Malnutrition of moderate degree 11/12/2020   Asymptomatic bacteriuria 11/10/2020   Abdominal pain 11/09/2020   Hypokalemia 11/09/2020   AKI (acute kidney injury) ([place]) 11/09/2020   MDD (major depressive disorder), recurrent, in full remission ([place]) 01/16/2020   Anxiety disorder 01/16/2020   Hyperlipidemia 12/15/2019   Carotid stenosis 12/15/2019   MDD (major depressive disorder), recurrent, in partial remission ([place]) 08/24/2019   MDD (major depressive disorder),  recurrent episode, mild ([place]) 04/08/2019   Insomnia due to mental condition 04/08/2019   Major neurocognitive disorder due to Alzheimer's disease, probable, without behavioral disturbance 04/08/2019   DDD (degenerative disc disease), cervical 12/19/2016   Frequent falls 12/10/2016   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   Nonrheumatic aortic valve stenosis 09/30/2016   (HFpEF) heart failure with preserved ejection fraction ([place]) 09/20/2016   COPD (chronic obstructive pulmonary disease) ([place]) 09/20/2016   Respiratory failure with hypoxia ([place]) 09/20/2016   NSTEMI (non-ST elevated myocardial infarction) ([place]) 08/21/2016   SOB (shortness of breath) 08/18/2016   Atherosclerosis of native arteries of extremity with intermittent claudication ([place]) 01/08/2016   Palpitations 07/10/2015   History of nonmelanoma skin cancer 03/16/2015   Pseudoaneurysm following procedure ([place]) 07/19/2014   Femoral artery occlusion, right ([place]) 06/28/2014   Claudication ([place]) 06/05/2014   Aortic insufficiency 03/20/2014   Coronary artery disease 03/20/2014   Chronic pain 03/20/2014   Memory loss 11/09/2012   Depressive disorder 08/02/2012   Neck pain 08/02/2012   Malaise and fatigue 08/02/2012   Hypertension, benign 08/02/2012   Dizziness 08/02/2012   Polyneuropathy in diabetes ([place]) 08/02/2012   Rheumatic fever with heart involvement 08/02/2012   Tobacco use disorder 08/02/2012   Diabetes mellitus ([place]) 08/02/1990   PCP:  [name], FNP Pharmacy:  No Pharmacies Listed    Social Determinants of Health (SDOH) Social History: SDOH Screenings   Food Insecurity: No Food Insecurity (10/18/2022)  Recent Concern: Food Insecurity - Food Insecurity Present (08/11/2022)  Housing: Low Risk  (10/18/2022)  Transportation Needs: No Transportation Needs (10/18/2022)  Utilities: Not At Risk (10/18/2022)  Financial Resource Strain: Medium Risk (10/19/2018)  Physical Activity: Inactive  (10/19/2018)  Social Connections: Unknown (10/19/2018)  Tobacco Use: High Risk (10/17/2022)   SDOH Interventions:     Readmission Risk Interventions    11/13/2020    2:27 PM  Readmission Risk Prevention Plan  Transportation Screening Complete  PCP or Specialist Appt within 3-5 Days Complete  HRI or [place] Complete  Social Work Consult for [place] Planning/Counseling Complete  Palliative Care Screening Not Applicable  Medication Review Press photographer) Complete

## 2022-10-20 NOTE — NC FL2 (Signed)
[place] LEVEL OF CARE FORM     IDENTIFICATION  Patient Name: Heather [name] Birthdate: 07-Feb-1951 Sex: female Admission Date (Current Location): 10/18/2022  [facility] and [place] Number:  Engineering geologist and Address:  [facility], [address] [place],  [address]      Provider Number: [id number]  Attending Physician Name and Address:  Sharen Hones, MD  Relative Name and Phone Number:       Current Level of Care: Hospital Recommended Level of Care: [place] Prior Approval Number:    Date Approved/Denied:   PASRR Number: [id number]  Discharge Plan: SNF    Current Diagnoses: Patient Active Problem List   Diagnosis Date Noted   Cellulitis of right groin 10/18/2022   Generalized weakness 10/18/2022   Fall at home, initial encounter 10/18/2022   Non-healing left groin open wound 10/18/2022   Hx of AKA (above knee amputation), left ([place]) 10/18/2022   Uncontrolled type 2 diabetes mellitus with hyperglycemia, with long-term current use of insulin ([place]) 10/18/2022   Failure to thrive in adult [id number]   Acute metabolic encephalopathy [id number]   Normocytic anemia 10/01/2022   Acute encephalopathy 09/28/2022   Surgical site infection 09/15/2022   Decreased oral intake 09/13/2022   Chronic diastolic CHF (congestive heart failure) ([place]) 09/13/2022   Dysphagia 09/13/2022   PVD (peripheral vascular disease) ([place]) 09/11/2022   Right groin wound 09/10/2022   Wound of left groin 09/10/2022   Pulmonary edema 08/16/2022   Abscess of anal and rectal regions 08/15/2022   Decubitus ulcer of sacral region, stage 2 ([place]) 08/15/2022   ICH (intracerebral hemorrhage) ([place]) 08/04/2022   Atherosclerosis of native arteries of extremity with rest pain ([place]) 04/02/2022   PAD (peripheral artery disease) ([place]) 04/11/2021   Ischemic leg pain 04/11/2021   Chronic mesenteric ischemia ([place]) 12/17/2020   Malnutrition of moderate  degree 11/12/2020   Asymptomatic bacteriuria 11/10/2020   Abdominal pain 11/09/2020   Hypokalemia 11/09/2020   AKI (acute kidney injury) ([place]) 11/09/2020   MDD (major depressive disorder), recurrent, in full remission ([place]) 01/16/2020   Anxiety disorder 01/16/2020   Hyperlipidemia 12/15/2019   Carotid stenosis 12/15/2019   MDD (major depressive disorder), recurrent, in partial remission ([place]) 08/24/2019   MDD (major depressive disorder), recurrent episode, mild ([place]) 04/08/2019   Insomnia due to mental condition 04/08/2019   Major neurocognitive disorder due to Alzheimer's disease, probable, without behavioral disturbance 04/08/2019   DDD (degenerative disc disease), cervical 12/19/2016   Frequent falls 12/10/2016   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   Nonrheumatic aortic valve stenosis 09/30/2016   (HFpEF) heart failure with preserved ejection fraction ([place]) 09/20/2016   COPD (chronic obstructive pulmonary disease) ([place]) 09/20/2016   Respiratory failure with hypoxia ([place]) 09/20/2016   NSTEMI (non-ST elevated myocardial infarction) ([place]) 08/21/2016   SOB (shortness of breath) 08/18/2016   Atherosclerosis of native arteries of extremity with intermittent claudication ([place]) 01/08/2016   Palpitations 07/10/2015   History of nonmelanoma skin cancer 03/16/2015   Pseudoaneurysm following procedure ([place]) 07/19/2014   Femoral artery occlusion, right ([place]) 06/28/2014   Claudication ([place]) 06/05/2014   Aortic insufficiency 03/20/2014   Coronary artery disease 03/20/2014   Chronic pain 03/20/2014   Memory loss 11/09/2012   Depressive disorder 08/02/2012   Neck pain 08/02/2012   Malaise and fatigue 08/02/2012   Hypertension, benign 08/02/2012   Dizziness 08/02/2012   Polyneuropathy in diabetes ([place]) 08/02/2012   Rheumatic fever with heart involvement 08/02/2012   Tobacco  use disorder 08/02/2012   Diabetes mellitus ([place]) 08/02/1990    Orientation RESPIRATION  BLADDER Height & Weight     Self, Time, Situation, Place  Normal Incontinent, External catheter Weight: 120 lb (54.4 kg) Height:  5' (152.4 cm)  BEHAVIORAL SYMPTOMS/MOOD NEUROLOGICAL BOWEL NUTRITION STATUS   (None)  (None) Incontinent Diet (Heart healthy/carb modified)  AMBULATORY STATUS COMMUNICATION OF NEEDS Skin   Extensive Assist Verbally Skin abrasions, Bruising, PU Stage and Appropriate Care, Surgical wounds (Blister, erythema/redness, scratch marks. Open incisions on both groins (foam daily).)   PU Stage 2 Dressing: Daily (Mid sacrum: Foam. Left ischial tuberosity: Foam.)                   Personal Care Assistance Level of Assistance  Bathing, Feeding, Dressing Bathing Assistance: Maximum assistance Feeding assistance: Limited assistance Dressing Assistance: Maximum assistance     Functional Limitations Info  Sight, Hearing, Speech Sight Info: Adequate Hearing Info: Adequate Speech Info: Adequate    SPECIAL CARE FACTORS FREQUENCY  PT (By licensed PT), OT (By licensed OT)     PT Frequency: 5 x week OT Frequency: 5 x week            Contractures Contractures Info: Not present    Additional Factors Info  Code Status, Allergies Code Status Info: Full code Allergies Info: Iodinated Contrast Media, Sulfa Antibiotics, Shellfish Allergy           Current Medications (10/20/2022):  This is the current hospital active medication list Current Facility-Administered Medications  Medication Dose Route Frequency Provider Last Rate Last Admin   acetaminophen (TYLENOL) tablet 650 mg  650 mg Oral Q6H PRN Mansy, Jan A, MD       Or   acetaminophen (TYLENOL) suppository 650 mg  650 mg Rectal Q6H PRN Mansy, Jan A, MD       albuterol (PROVENTIL) (2.5 MG/3ML) 0.083% nebulizer solution 2.5 mg  2.5 mg Nebulization Q4H PRN Renda Rolls, RPH       amLODipine (NORVASC) tablet 10 mg  10 mg Oral Daily Mansy, Jan A, MD   10 mg at 10/20/22 0850   atorvastatin (LIPITOR) tablet 80 mg   80 mg Oral QHS Mansy, Jan A, MD   80 mg at 10/19/22 2049   carvedilol (COREG) tablet 6.25 mg  6.25 mg Oral BID WC Mansy, Jan A, MD   6.25 mg at 10/20/22 0850   Chlorhexidine Gluconate Cloth 2 % PADS 6 each  6 each Topical Q0600 Sharen Hones, MD   6 each at 10/20/22 0851   cholecalciferol (VITAMIN D3) 25 MCG (1000 UNIT) tablet 1,000 Units  1,000 Units Oral Daily Mansy, Jan A, MD   1,000 Units at 10/20/22 0850   clopidogrel (PLAVIX) tablet 75 mg  75 mg Oral Daily Mansy, Jan A, MD   75 mg at 10/20/22 0850   docusate sodium (COLACE) capsule 100 mg  100 mg Oral Daily Mansy, Jan A, MD   100 mg at 10/20/22 0851   dronabinol (MARINOL) capsule 2.5 mg  2.5 mg Oral BID AC Mansy, Jan A, MD   2.5 mg at 10/19/22 1707   enoxaparin (LOVENOX) injection 40 mg  40 mg Subcutaneous Q24H Mansy, Jan A, MD   40 mg at 10/20/22 0850   escitalopram (LEXAPRO) tablet 10 mg  10 mg Oral Daily Mansy, Jan A, MD   10 mg at 10/20/22 0852   fluticasone (FLONASE) 50 MCG/ACT nasal spray 1 spray  1 spray Each Nare Daily PRN Mansy,  Jan A, MD       hydrALAZINE (APRESOLINE) tablet 50 mg  50 mg Oral TID Mansy, Jan A, MD   50 mg at 10/20/22 0851   insulin aspart (novoLOG) injection 0-9 Units  0-9 Units Subcutaneous TID AC & HS Sharion Settler, NP   1 Units at 10/20/22 0849   insulin glargine-yfgn [facility]) injection 10 Units  10 Units Subcutaneous BID Mansy, Jan A, MD   10 Units at 10/20/22 0853   levETIRAcetam (KEPPRA) tablet 500 mg  500 mg Oral BID Mansy, Jan A, MD   500 mg at 10/20/22 0850   loratadine (CLARITIN) tablet 10 mg  10 mg Oral Daily Mansy, Jan A, MD   10 mg at 10/20/22 0850   magnesium hydroxide (MILK OF MAGNESIA) suspension 30 mL  30 mL Oral Daily PRN Mansy, Jan A, MD       mirtazapine (REMERON) tablet 7.5 mg  7.5 mg Oral QHS Mansy, Jan A, MD   7.5 mg at 10/19/22 2049   mometasone-formoterol (DULERA) 200-5 MCG/ACT inhaler 2 puff  2 puff Inhalation BID Mansy, Jan A, MD   2 puff at 10/20/22 0854   multivitamin with minerals  tablet 1 tablet  1 tablet Oral Daily Mansy, Jan A, MD       ondansetron [facility]) tablet 4 mg  4 mg Oral Q6H PRN Mansy, Jan A, MD       Or   ondansetron [facility]) injection 4 mg  4 mg Intravenous Q6H PRN Mansy, Jan A, MD       oxyCODONE-acetaminophen (PERCOCET/ROXICET) 5-325 MG per tablet 1 tablet  1 tablet Oral Q6H PRN Sharen Hones, MD   1 tablet at 10/20/22 0858   pantoprazole (PROTONIX) EC tablet 40 mg  40 mg Oral Daily Mansy, Jan A, MD   40 mg at 10/20/22 [id number]   pneumococcal 20-valent conjugate vaccine (PREVNAR 20) injection 0.5 mL  0.5 mL Intramuscular Prior to discharge Mansy, Jan A, MD       polyethylene glycol (MIRALAX / GLYCOLAX) packet 17 g  17 g Oral Daily Mansy, Jan A, MD   17 g at 10/18/22 [id number]   senna-docusate (Senokot-S) tablet 1 tablet  1 tablet Oral Daily Mansy, Jan A, MD   1 tablet at 10/20/22 0850   traMADol (ULTRAM) tablet 50 mg  50 mg Oral Q8H PRN Mansy, Jan A, MD   50 mg at 10/19/22 0316   traZODone (DESYREL) tablet 25 mg  25 mg Oral QHS PRN Mansy, Arvella Merles, MD         Discharge Medications: Please see discharge summary for a list of discharge medications.  Relevant Imaging Results:  Relevant Lab Results:   Additional Information SS#: [id number]. Was at [facility] 1/29-2/3 and 2/6-2/7 and [facility] 2/12-2/23. Used about 17 days.  Candie Chroman, LCSW

## 2022-10-20 NOTE — Progress Notes (Addendum)
  Progress Note   Patient: Heather [name] [id number] DOB: 11-14-1950 DOA: 10/18/2022     0 DOS: the patient was seen and examined on 10/20/2022   Brief hospital course: Heather [name] is a 72 y.o. 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  Patient was recently in the hospital for treatment wound infection, antibiotic completed.  She was sent to an SNF, but she signed out.  She came back to the hospital the same day due to a fall and could not ambulate.   Principal Problem:   Cellulitis of right groin Active Problems:   Right groin wound   Hypertension, benign   Coronary artery disease   PAD (peripheral artery disease) (HCC)   Chronic diastolic CHF (congestive heart failure) (HCC)   Generalized weakness   Fall at home, initial encounter   Non-healing left groin open wound   Hx of AKA (above knee amputation), left ([place])   Uncontrolled type 2 diabetes mellitus with hyperglycemia, with long-term current use of insulin (HCC)   Assessment and Plan: Generalized weakness. Fall at home. Status post left AKA. Patient is seen by PT/OT, did not recommend acute rehab. Consult placed.   Bilateral groin wounds. Completed antibiotics for recent cellulitis, no chronic infection.  Continue wound care.   Uncontrolled type 2 diabetes with hyperglycemia No change in treatment regimen today.   Coronary artery disease Chronic diastolic congestive heart failure. Stable.   Essential hypertension. Blood pressure stable.  Recent graft infection. Patient is supposed to continue Zosyn until 3/5.  Will continue.    Subjective:  Has no complaint today.  Had a normal bowel movement this  morning.  Physical Exam: Vitals:   10/19/22 1602 10/19/22 1914 10/20/22 0421 10/20/22 0753  BP: (!) 104/51 (!) 115/52 (!) 137/52 (!) 140/53  Pulse: 66 72 80 77  Resp: 16 16 20 17  $ Temp: 98.3 F (36.8 C) 98.3 F (36.8 C) 98.7 F (37.1 C) 98.7 F (37.1 C)  TempSrc: Oral Oral Oral Oral  SpO2: 94% 93% 93% 94%  Weight:      Height:       General exam: Appears calm and comfortable  Respiratory system: Clear to auscultation. Respiratory effort normal. Cardiovascular system: S1 & S2 heard, RRR. No JVD, murmurs, rubs, gallops or clicks. No pedal edema. Gastrointestinal system: Abdomen is nondistended, soft and nontender. No organomegaly or masses felt. Normal bowel sounds heard. Central nervous system: Alert and oriented. No focal neurological deficits. Extremities: Left AKA. Skin: No rashes, lesions or ulcers Psychiatry: Judgement and insight appear normal. Mood & affect appropriate.    Data Reviewed:  There are no new results to review at this time.  Family Communication: None  Disposition: Status is: Observation      Time spent: 35 minutes  Author: Sharen Hones, MD 10/20/2022 10:50 AM  For on call review [email or web address].

## 2022-10-20 NOTE — Care Management Obs Status (Signed)
[facility] NOTIFICATION   Patient Details  Name: Corren Gainey MRN: [id number] Date of Birth: [birth year]   Medicare Observation Status Notification Given:  Yes    Candie Chroman, LCSW 10/20/2022, 9:39 AM

## 2022-10-20 NOTE — Progress Notes (Signed)
   10/20/22 0900  Spiritual Encounters  Type of Visit Initial  Care provided to: Pt and family  Referral source Chaplain team  Reason for visit Routine spiritual support  OnCall Visit Yes   Chaplain found husband in lobby needing assistance. Chaplain brought him to patient's room and visited with them. Chaplain provided compassionate presence and reflective listening as patient and husband spoke about health challenges. Patient and husband appreciated Chaplain visit.

## 2022-10-21 DIAGNOSIS — Y92009 Unspecified place in unspecified non-institutional (private) residence as the place of occurrence of the external cause: Secondary | ICD-10-CM | POA: Diagnosis not present

## 2022-10-21 DIAGNOSIS — Z882 Allergy status to sulfonamides status: Secondary | ICD-10-CM | POA: Diagnosis not present

## 2022-10-21 DIAGNOSIS — Z91041 Radiographic dye allergy status: Secondary | ICD-10-CM | POA: Diagnosis not present

## 2022-10-21 DIAGNOSIS — I2510 Atherosclerotic heart disease of native coronary artery without angina pectoris: Secondary | ICD-10-CM | POA: Diagnosis present

## 2022-10-21 DIAGNOSIS — Z91013 Allergy to seafood: Secondary | ICD-10-CM | POA: Diagnosis not present

## 2022-10-21 DIAGNOSIS — L8990 Pressure ulcer of unspecified site, unspecified stage: Secondary | ICD-10-CM | POA: Insufficient documentation

## 2022-10-21 DIAGNOSIS — I5032 Chronic diastolic (congestive) heart failure: Secondary | ICD-10-CM | POA: Diagnosis present

## 2022-10-21 DIAGNOSIS — E1142 Type 2 diabetes mellitus with diabetic polyneuropathy: Secondary | ICD-10-CM | POA: Diagnosis present

## 2022-10-21 DIAGNOSIS — F17210 Nicotine dependence, cigarettes, uncomplicated: Secondary | ICD-10-CM | POA: Diagnosis present

## 2022-10-21 DIAGNOSIS — Z953 Presence of xenogenic heart valve: Secondary | ICD-10-CM | POA: Diagnosis not present

## 2022-10-21 DIAGNOSIS — E1165 Type 2 diabetes mellitus with hyperglycemia: Secondary | ICD-10-CM | POA: Diagnosis present

## 2022-10-21 DIAGNOSIS — L03314 Cellulitis of groin: Secondary | ICD-10-CM | POA: Diagnosis present

## 2022-10-21 DIAGNOSIS — Z955 Presence of coronary angioplasty implant and graft: Secondary | ICD-10-CM | POA: Diagnosis not present

## 2022-10-21 DIAGNOSIS — Z89612 Acquired absence of left leg above knee: Secondary | ICD-10-CM | POA: Diagnosis not present

## 2022-10-21 DIAGNOSIS — Z7951 Long term (current) use of inhaled steroids: Secondary | ICD-10-CM | POA: Diagnosis not present

## 2022-10-21 DIAGNOSIS — Z85828 Personal history of other malignant neoplasm of skin: Secondary | ICD-10-CM | POA: Diagnosis not present

## 2022-10-21 DIAGNOSIS — E785 Hyperlipidemia, unspecified: Secondary | ICD-10-CM | POA: Diagnosis present

## 2022-10-21 DIAGNOSIS — E162 Hypoglycemia, unspecified: Secondary | ICD-10-CM | POA: Insufficient documentation

## 2022-10-21 DIAGNOSIS — Z794 Long term (current) use of insulin: Secondary | ICD-10-CM | POA: Diagnosis not present

## 2022-10-21 DIAGNOSIS — Z7902 Long term (current) use of antithrombotics/antiplatelets: Secondary | ICD-10-CM | POA: Diagnosis not present

## 2022-10-21 DIAGNOSIS — I252 Old myocardial infarction: Secondary | ICD-10-CM | POA: Diagnosis not present

## 2022-10-21 DIAGNOSIS — I110 Hypertensive heart disease with heart failure: Secondary | ICD-10-CM | POA: Diagnosis present

## 2022-10-21 DIAGNOSIS — Z818 Family history of other mental and behavioral disorders: Secondary | ICD-10-CM | POA: Diagnosis not present

## 2022-10-21 DIAGNOSIS — W19XXXA Unspecified fall, initial encounter: Secondary | ICD-10-CM | POA: Diagnosis present

## 2022-10-21 DIAGNOSIS — E1151 Type 2 diabetes mellitus with diabetic peripheral angiopathy without gangrene: Secondary | ICD-10-CM | POA: Diagnosis present

## 2022-10-21 DIAGNOSIS — K219 Gastro-esophageal reflux disease without esophagitis: Secondary | ICD-10-CM | POA: Diagnosis present

## 2022-10-21 DIAGNOSIS — S31109D Unspecified open wound of abdominal wall, unspecified quadrant without penetration into peritoneal cavity, subsequent encounter: Secondary | ICD-10-CM | POA: Diagnosis not present

## 2022-10-21 DIAGNOSIS — F259 Schizoaffective disorder, unspecified: Secondary | ICD-10-CM | POA: Diagnosis present

## 2022-10-21 DIAGNOSIS — J449 Chronic obstructive pulmonary disease, unspecified: Secondary | ICD-10-CM | POA: Diagnosis present

## 2022-10-21 DIAGNOSIS — R531 Weakness: Secondary | ICD-10-CM

## 2022-10-21 LAB — GLUCOSE, CAPILLARY
Glucose-Capillary: 63 mg/dL — ABNORMAL LOW (ref 70–99)
Glucose-Capillary: 107 mg/dL — ABNORMAL HIGH (ref 70–99)
Glucose-Capillary: 231 mg/dL — ABNORMAL HIGH (ref 70–99)
Glucose-Capillary: 102 mg/dL — ABNORMAL HIGH (ref 70–99)
Glucose-Capillary: 210 mg/dL — ABNORMAL HIGH (ref 70–99)

## 2022-10-21 MED ORDER — INSULIN GLARGINE-YFGN 100 UNIT/ML ~~LOC~~ SOLN
10.0000 [IU] | Freq: Every day | SUBCUTANEOUS | Status: DC
  Administered 2022-10-21: 10 [IU] via SUBCUTANEOUS
  Filled 2022-10-21 (×2): qty 0.1

## 2022-10-21 NOTE — Progress Notes (Signed)
PHARMACY CONSULT NOTE FOR:  OUTPATIENT  PARENTERAL ANTIBIOTIC THERAPY (OPAT) Continuation of previous OPAT orders being followed by [facility] in [place] (Dr West Bali)  2/27 addendum: Informed patient going home and not to SNF so will change method of administration to continuous    Indication: Polymicrobial ground wound s/p recent vascular surgery with grafts  Regimen: piperacillin/tazobactam 13.5 gm IV daily as continuous infusion  End date: 10/28/2022  Per Dr West Bali (ID physician at [facility]) patient to start ciprofloxacin '500mg'$  PO BID and metronidazole '500mg'$  PO BID on 10/29/2022 for suppressive therapy.      IV antibiotic discharge orders are pended. To discharging provider:  please sign these orders via discharge navigator,  Select New Orders & click on the button choice - Manage This Unsigned Work.     Thank you for allowing pharmacy to be a part of this patient's care.  Doreene Eland, PharmD, BCPS, BCIDP Work Cell: [phone] 10/21/2022 3:01 PM

## 2022-10-21 NOTE — TOC CM/SW Note (Signed)
    Durable Medical Equipment  (From admission, onward)           Start     Ordered   10/21/22 1526  For home use only DME lightweight manual wheelchair with seat cushion  Once       Comments: Patient suffers from AKA which impairs their ability to perform daily activities like grooming and toileting in the home.  A walker will not resolve  issue with performing activities of daily living. A wheelchair will allow patient to safely perform daily activities. Patient is not able to propel themselves in the home using a standard weight wheelchair due to general weakness. Patient can self propel in the lightweight wheelchair. Length of need 12 months . Accessories: elevating leg rests (ELRs), wheel locks, extensions and anti-tippers.   10/21/22 1526   10/21/22 1525  For home use only DME Hospital bed  Once       Question Answer Comment  Length of Need 12 Months   The above medical condition requires: Patient requires the ability to reposition frequently   Bed type Semi-electric      10/21/22 1526   10/21/22 1250  For home use only DME Other see comment  Once       Comments: Slide Board  Question:  Length of Need  Answer:  6 Months   10/21/22 1250

## 2022-10-21 NOTE — TOC Progression Note (Addendum)
Transition of Care [facility]) - Progression Note    Patient Details  Name: Maanvi Wentzell MRN: [id number] Date of Birth: Oct 21, 1950  Transition of Care [facility]) CM/SW [place], LCSW Phone Number: 10/21/2022, 11:55 AM  Clinical Narrative:  Met with patient. She prefers to return home with home health at discharge. No agency preference. Will start search. She has a Civil engineer, contracting, Transport planner, electric wheelchair, SPC, and rollator at home. She lives home with husband and said he can pick her up at discharge.   12:05 pm: [facility] has accepted referral for PT, OT, RN, aide, SW. Patient is aware and agreeable. DME recommendations for hospital bed, hoyer lift, and wheelchair. She does not want hoyer lift. Agreeable to wheelchair. She will ask her husband about the hospital bed and follow up with CSW.  1:07 pm: [facility] is confirming they can still accept on IV abx. Sent referral information to [employer].  1:23 pm: [facility] liaison will have [facility] provide RN services until [facility] can start care.   2:03 pm: Patient has not spoken with her husband about the hospital bed yet. She plans to talk to him within the hours. CSW offered to call but she said he was sleeping a voicemail was full.  3:50 pm: Patient still has not talked to her husband and probably won't until tonight. CSW ordered DME through [employer] and notified liaison that patient isn't sure about bed yet.  Expected Discharge Plan: Skilled Nursing Facility Barriers to Discharge: Ship broker, Continued Medical Work up  Expected Discharge Plan and Services     Post Acute Care Choice: [place] Living arrangements for the past 2 months: Single Family Home                                       Social Determinants of Health (SDOH) Interventions SDOH Screenings   Food Insecurity: No Food Insecurity (10/18/2022)  Recent Concern: [place]  Present (08/11/2022)  Housing: Low Risk  (10/18/2022)  Transportation Needs: No Transportation Needs (10/18/2022)  Utilities: Not At Risk (10/18/2022)  Financial Resource Strain: Medium Risk (10/19/2018)  Physical Activity: Inactive (10/19/2018)  Social Connections: Unknown (10/19/2018)  Tobacco Use: High Risk (10/17/2022)    Readmission Risk Interventions    11/13/2020    2:27 PM  Readmission Risk Prevention Plan  Transportation Screening Complete  PCP or Specialist Appt within 3-5 Days Complete  HRI or [place] Complete  Social Work Consult for [place] Planning/Counseling Complete  Palliative Care Screening Not Applicable  Medication Review Press photographer) Complete

## 2022-10-21 NOTE — Telephone Encounter (Signed)
Patient currently admitted

## 2022-10-21 NOTE — Progress Notes (Signed)
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   OT comments  Pt seen for OT treatment on this date. Upon arrival to room pt, semi-supine in bed, agreeable to OT tx session. Pt able to come to sitting at EOB with MOD A. She requires MIN A to maintain static sitting balance at EOB. Is able to maintain sitting balance for ~10 min and place dinner order with set up assist. She fatigues while sitting EOB and requests return to bed. She is able to perform lateral scoots at EOB to re-position with MOD A. MAX A for peri-care and LB dressing as described below. See ADL section for additional details. Pt making good progress toward goals and continues to benefit from skilled OT services to maximize return to PLOF and minimize risk of future falls, injury, caregiver burden, and readmission. Will continue to follow POC. Discharge recommendation remains appropriate.     Recommendations for follow up therapy are one component of a multi-disciplinary discharge planning process, led by the attending physician.  Recommendations may be updated based on patient status, additional functional criteria and insurance authorization.    Follow Up Recommendations  Skilled nursing-short  term rehab (<3 hours/day)     Assistance Recommended at Discharge Frequent or constant Supervision/Assistance  Patient can return home with the following  Two people to help with walking and/or transfers;A lot of help with bathing/dressing/bathroom;Assistance with cooking/housework;Assistance with feeding;Direct supervision/assist for medications management;Direct supervision/assist for financial management;Assist for transportation;Help with stairs or ramp for entrance   Equipment Recommendations  Wheelchair (measurements OT);Wheelchair cushion (measurements OT);Hospital bed;Other (comment) (Lift)    Recommendations for Other Services      Precautions / Restrictions Precautions Precautions: Fall Restrictions Weight Bearing Restrictions: No RLE Weight Bearing: Weight bearing as tolerated Other Position/Activity Restrictions: s/p L AKA 12/23       Mobility Bed Mobility Overal bed mobility: Needs Assistance Bed Mobility: Supine to Sit, Sit to Supine     Supine to sit: Mod assist, HOB elevated Sit to supine: Mod assist        Transfers Overall transfer level: Needs assistance Equipment used: 1 person hand held assist              Lateral/Scoot Transfers: Mod assist General transfer comment: MOD A to scoot toward [facility]     Balance Overall balance assessment: Needs assistance Sitting-balance support: Feet supported Sitting balance-Leahy Scale: Fair Sitting balance - Comments: Requires intermittent MIN A to maintain sitting balance at EOB Postural control: Posterior lean     Standing balance comment: NT                           ADL either performed or assessed with clinical judgement   ADL Overall ADL's : Needs assistance/impaired Eating/Feeding: Set up;Bed level   Grooming: Sitting;Minimal assistance Grooming Details (indicate cue type and reason): Intermittent MIN A to maintain sitting balance  at EOB,                              Functional mobility during ADLs: Moderate assistance;Cueing for safety General ADL Comments: Pt continues to be functionally limited by increased pain with mobility, decreased functional use of RLE/LLE, and generalized UE weakness. She requires intermittent MIN A to maintain sitting at EOB, MOD A for lateral scooting at EOB. MAX A to don LB clothing and LLE shrinker at bed level.    Extremity/Trunk Assessment              Vision Patient Visual Report: No change from baseline     Perception     Praxis      Cognition Arousal/Alertness: Awake/alert Behavior During Therapy: WFL for tasks assessed/performed Overall Cognitive Status: Within Functional Limits for tasks assessed                                          Exercises Other Exercises Other Exercises: OT facilitated bed/functional mobility attempts and ADL management as described above. Pt educated on safety, falls prevention, shrinker use, and compensatory strategies for ADL management t/o session.    Shoulder Instructions       General Comments      Pertinent Vitals/ Pain       Pain Assessment Pain Assessment: 0-10 Pain Score: 4  Pain Location: back Pain Descriptors / Indicators: Guarding, Grimacing Pain Intervention(s): Limited activity within patient's tolerance, Monitored during session, Repositioned  Home Living                                          Prior Functioning/Environment              Frequency  Min 2X/week        Progress Toward Goals  OT Goals(current goals can now be found in the care plan section)  Progress towards OT goals: Progressing toward goals  Acute Rehab OT Goals Patient Stated Goal: To go home OT Goal Formulation: With patient Time For Goal Achievement: 11/01/22 Potential to Achieve Goals: Good  Plan Discharge plan remains appropriate;Frequency remains appropriate    Co-evaluation                 AM-PAC OT "6 Clicks"  Daily Activity     Outcome Measure   Help from another person eating meals?: A Little Help from another person taking care of personal grooming?: A Little Help from another person toileting, which includes using toliet, bedpan, or urinal?: A Lot Help from another person bathing (including washing, rinsing, drying)?: A Lot Help from another person to put on and taking off regular upper body clothing?: A Little Help from another person to put on and taking off regular lower body clothing?: A Lot 6 Click Score: 15    End of Session Equipment Utilized During Treatment: Rolling walker (2 wheels)  OT Visit Diagnosis: Unsteadiness on feet (R26.81);Other abnormalities of gait and mobility (R26.89);Muscle weakness (generalized) (M62.81)   Activity Tolerance Patient tolerated treatment well   Patient Left in bed;with call bell/phone within reach;with bed alarm set   Nurse Communication          Time: [id number] OT Time Calculation (min): 37 min  Charges: OT General  Charges $OT Visit: 1 Visit OT Treatments $Self Care/Home Management : 23-37 mins  Shara Blazing, M.S., OTR/L 10/21/22, 4:06 PM\

## 2022-10-21 NOTE — Progress Notes (Signed)
Mobility Specialist - Progress Note   10/21/22 1012  Mobility  Activity Transferred from bed to chair  Level of Assistance Moderate assist, patient does 50-74%  Assistive Device Sliding board  RLE Weight Bearing WBAT  Activity Response Tolerated well  $Mobility charge 1 Mobility   Pt supine upon entry, utilizing RA. Pt willing to participate in OOB transfer to the recliner. Pt completed bed mob ModA for trunk support and RLE to sit EOB. Pt required minA+2 to set up right buttock onto sliding board. ModA+1 for glut lift off sliding from bed to chair. Pt able to use R foot to assist, WBAT. Pt left sitting in recliner with alarm set and needs within reach.   Candie Mile Mobility Specialist 10/21/22 10:28 AM

## 2022-10-21 NOTE — Progress Notes (Signed)
  Progress Note   Patient: Heather [name] [id number] DOB: 12/17/1950 DOA: 10/18/2022     0 DOS: the patient was seen and examined on 10/21/2022   Brief hospital course: Willemina Clowers is a 72 y.o. 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  Patient was recently in the hospital for treatment wound infection, antibiotic completed.  She was sent to an SNF, but she signed out.  She came back to the hospital the same day due to a fall and could not ambulate.   Principal Problem:   Cellulitis of right groin Active Problems:   Right groin wound   Hypertension, benign   Coronary artery disease   PAD (peripheral artery disease) (HCC)   Chronic diastolic CHF (congestive heart failure) (HCC)   Generalized weakness   Fall at home, initial encounter   Non-healing left groin open wound   Hx of AKA (above knee amputation), left ([place])   Uncontrolled type 2 diabetes mellitus with hyperglycemia, with long-term current use of insulin (HCC)   Hypoglycemia   Pressure injury of skin   Weakness   Assessment and Plan: Generalized weakness. Fall at home. Status post left AKA. Patient is seen by PT/OT, did not recommend acute rehab. However, patient and family decided to go home tomorrow.  IV infusion teaching will happen tomorrow before discharge.   Bilateral groin wounds. Completed antibiotics for recent cellulitis, no chronic infection.  Continue wound care.   Uncontrolled type 2 diabetes with hyperglycemia No change in treatment regimen today.   Coronary artery disease Chronic diastolic congestive heart failure. Stable.   Essential hypertension. Blood pressure stable.   LE arterial  graft infection. Antibiotics continued, OPAT placed for outpatient infusion.       Subjective:  Doing well today, currently she has no complaints.  Physical Exam: Vitals:   10/20/22 1555 10/20/22 1928 10/21/22 0401 10/21/22 0739  BP: (!) 142/58 (!) 138/58 (!) 130/50 (!) 111/48  Pulse: 81 82 64 64  Resp: 16 16 19 18  $ Temp: 98.2 F (36.8 C) 98.4 F (36.9 C) 98.4 F (36.9 C) 98.8 F (37.1 C)  TempSrc: Oral Oral  Oral  SpO2: 96% 96% 99% 94%  Weight:      Height:       General exam: Appears calm and comfortable  Respiratory system: Clear to auscultation. Respiratory effort normal. Cardiovascular system: S1 & S2 heard, RRR. No JVD, murmurs, rubs, gallops or clicks. No pedal edema. Gastrointestinal system: Abdomen is nondistended, soft and nontender. No organomegaly or masses felt. Normal bowel sounds heard. Central nervous system: Alert and oriented. No focal neurological deficits. Extremities: Left AKA. Skin: No rashes, lesions or ulcers Psychiatry: Judgement and insight appear normal. Mood & affect appropriate.    Data Reviewed:  There are no new results to review at this time.  Family Communication: None  Disposition: Status is: Inpatient Remains inpatient appropriate because: Unsafe discharge.     Time spent: 35 minutes  Author: Sharen Hones, MD 10/21/2022 2:46 PM  For on call review [email or web address].

## 2022-10-22 ENCOUNTER — Inpatient Hospital Stay: Payer: Self-pay

## 2022-10-22 ENCOUNTER — Ambulatory Visit (HOSPITAL_COMMUNITY): Payer: Medicare HMO

## 2022-10-22 DIAGNOSIS — L03314 Cellulitis of groin: Secondary | ICD-10-CM

## 2022-10-22 LAB — GLUCOSE, CAPILLARY
Glucose-Capillary: 158 mg/dL — ABNORMAL HIGH (ref 70–99)
Glucose-Capillary: 219 mg/dL — ABNORMAL HIGH (ref 70–99)
Glucose-Capillary: 94 mg/dL (ref 70–99)

## 2022-10-22 MED ORDER — CIPROFLOXACIN HCL 500 MG PO TABS: 500.0000 mg | ORAL_TABLET | Freq: Two times a day (BID) | ORAL | 0 refills | Status: AC

## 2022-10-22 MED ORDER — SODIUM CHLORIDE 0.9% FLUSH: 10.0000 mL | Freq: Two times a day (BID) | INTRAVENOUS | Status: DC

## 2022-10-22 MED ORDER — PIPERACILLIN-TAZOBACTAM IV (FOR PTA / DISCHARGE USE ONLY): 13.5000 g | INTRAVENOUS | 0 refills | Status: DC

## 2022-10-22 MED ORDER — METRONIDAZOLE 500 MG PO TABS: 500.0000 mg | ORAL_TABLET | Freq: Two times a day (BID) | ORAL | 0 refills | Status: AC

## 2022-10-22 MED ORDER — HEPARIN SOD (PORK) LOCK FLUSH 100 UNIT/ML IV SOLN: 250.0000 [IU] | INTRAVENOUS | Status: DC | PRN

## 2022-10-22 MED ORDER — SODIUM CHLORIDE 0.9% FLUSH: 10.0000 mL | INTRAVENOUS | Status: DC | PRN

## 2022-10-22 NOTE — Progress Notes (Signed)
Peripherally Inserted Central Catheter Exchange  The IV Nurse has discussed with the patient and/or persons authorized to consent for the patient, the purpose of this procedure and the potential benefits and risks involved with this procedure.  The benefits include less needle sticks, lab draws from the catheter, and the patient may be discharged home with the catheter. Risks include, but not limited to, infection, bleeding, blood clot (thrombus formation), and puncture of an artery; nerve damage and irregular heartbeat and possibility to perform a PICC exchange if needed/ordered by physician.  Alternatives to this procedure were also discussed.  Bard Power PICC patient education guide, fact sheet on infection prevention and patient information card has been provided to patient /or left at bedside.    PICC Placement Documentation  PICC Single Lumen [id number] Left Basilic 40 cm 0 cm (Active)  Indication for Insertion or Continuance of Line Home intravenous therapies (PICC only) 10/22/22 1629  Exposed Catheter (cm) 0 cm 10/22/22 1629  Site Assessment Clean, Dry, Intact 10/22/22 1629  Line Status Flushed;Saline locked;Blood return noted 10/22/22 1629  Dressing Type Transparent;Securing device 10/22/22 1629  Dressing Status Antimicrobial disc in place 10/22/22 [place] Not Applicable [id number]  Line Care Connections checked and tightened 10/22/22 1629  Line Adjustment (NICU/IV Team Only) No 10/22/22 1629  Dressing Intervention New dressing 10/22/22 1629  Dressing Change Due 10/29/22 10/22/22 [place] 10/22/2022, 4:32 PM

## 2022-10-22 NOTE — Discharge Summary (Signed)
Heather [name] [id number] DOB: 02-10-1951 DOA: 10/18/2022  PCP: [name], FNP  Admit date: 10/18/2022 Discharge date: 10/22/2022  Time spent: 40 minutes  Recommendations for Outpatient Follow-up:  Vascular surgery, ID, and neurology f/u PCP f/u     Discharge Diagnoses:  Principal Problem:   Cellulitis of right groin Active Problems:   Right groin wound   Hypertension, benign   Coronary artery disease   PAD (peripheral artery disease) (HCC)   Chronic diastolic CHF (congestive heart failure) ([place])   Generalized weakness   Fall at home, initial encounter   Non-healing left groin open wound   Hx of AKA (above knee amputation), left ([facility])   Uncontrolled type 2 diabetes mellitus with hyperglycemia, with long-term current use of insulin ([place])   Hypoglycemia   Pressure injury of skin   Weakness   Discharge Condition: stable  Diet recommendation: heart healthy  Filed Weights   10/17/22 2116 10/18/22 0332  Weight: 50.3 kg 54.4 kg    History of present illness:  From admission h and p Heather [name] is a 72 y.o. 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   Patient was recently in the hospital for bilateral inguinal wound infection, she was sent to nursing home for rehab.  She is signed out [place] and the left the SNF yesterday.  However, patient was found significant weakness, could not get around.  She had a fall at home, was complaining some lower back pain, x-ray did not show acute changes.  She came back to the emergency room requesting placed back to nursing home.  Hospital Course:  Patient presented after leaving AMA from SNF in setting of several  recent hospitalizations for vascular procedure complicated by surgical site (groin) infection. Here pt/ot advises SNF but patient now elects to discharge home (will order full set home health services, nursing, aide, pt/ot). Her going wounds are healing and we continued zosyn, plan is to continue that through 3/5 and then start cipro/flagyl suppressive therapy. Home infusion services consulted and patient/husband received training on day of discharge. Has picc in left arm. Will need f/u with vascular surgery and with ID. Also referring patient to neurology given new seizures at recent hospitalization. Remains at high risk for readmission but patient firm in her decision to discharge home.   Procedures: none   Consultations: none  Discharge Exam: Vitals:   10/22/22 0355 10/22/22 0800  BP: 129/76 (!) 118/57  Pulse: 72 (!) 58  Resp: 18 16  Temp: (!) 97.5 F (36.4 C) 98 F (36.7 C)  SpO2: 100% 95%    General: NAD Cardiovascular: RRR Respiratory: CTAB Ext: warm Skin: bandages over bilateral groin wounds  Discharge Instructions   Discharge Instructions     [facility] pharmacist to adjust dose for Vancomycin, Aminoglycosides and other anti-infective therapies as requested by physician.   Complete by: As directed    [facility] to provide Cath Flo '2mg'$    Complete by: As directed    Administer for PICC line occlusion and as ordered by physician for other access device issues.   Ambulatory referral to Neurology   Complete by: As directed    Anaphylaxis Kit: Provided to treat any anaphylactic reaction to the medication being provided to the patient if First Dose  or when requested by physician   Complete by: As directed    Epinephrine '1mg'$ /ml vial / amp: Administer 0.'3mg'$  (0.17m) subcutaneously once for moderate to severe anaphylaxis, nurse to call physician and pharmacy when reaction occurs and call 911 if needed for immediate care   Diphenhydramine '50mg'$ /ml IV vial:  Administer 25-'50mg'$  IV/IM PRN for first dose reaction, rash, itching, mild reaction, nurse to call physician and pharmacy when reaction occurs   Sodium Chloride 0.9% NS 5092mIV: Administer if needed for hypovolemic blood pressure drop or as ordered by physician after call to physician with anaphylactic reaction   Change dressing on IV access line weekly and PRN   Complete by: As directed    Diet - low sodium heart healthy   Complete by: As directed    Discharge wound care:   Complete by: As directed    Change dressing daily   Face-to-face encounter (required for Medicare/Medicaid patients)   Complete by: As directed    I NoDesma Maximertify that this patient is under my care and that I, or a nurse practitioner or physician's assistant working with me, had a face-to-face encounter that meets the physician face-to-face encounter requirements with this patient on 10/22/2022. The encounter with the patient was in whole, or in part for the following medical condition(s) which is the primary reason for home health care (List medical condition): lower extremity amputation   The encounter with the patient was in whole, or in part, for the following medical condition, which is the primary reason for home health care: leg amputation   I certify that, based on my findings, the following services are medically necessary home health services:  Nursing Physical therapy     Reason for Medically Necessary Home Health Services: Skilled Nursing- Complex Wound Care   My clinical findings support the need for the above services: Bedbound   Further, I certify that my clinical findings support that this patient is homebound due to:  Open/draining pressure/stasis ulcer Can transfer bed to chair only     Flush IV access with Sodium Chloride 0.9% and Heparin 10 units/ml or 100 units/ml   Complete by: As directed    Home Health   Complete by: As directed    To provide the following care/treatments:  [place]     Home infusion instructions - [facility]   Complete by: As directed    Instructions: Flush IV access with Sodium Chloride 0.9% and Heparin 10units/ml or 100units/ml   Change dressing on IV access line: Weekly and PRN   Instructions Cath Flo '2mg'$ : Administer for PICC Line occlusion and as ordered by physician for other access device   [facility] pharmacist to adjust dose for: Vancomycin, Aminoglycosides and other anti-infective therapies as requested by physician   Increase activity slowly   Complete by: As directed    Method of administration may be changed at the discretion of home infusion pharmacist based upon assessment of the patient and/or caregiver's ability to self-administer the medication ordered   Complete by: As directed       Allergies as of 10/22/2022       Reactions   Iodinated Contrast Media Hives   Sulfa Antibiotics Hives   Shellfish Allergy Nausea And Vomiting, Rash   Scallops specifically         Medication List     TAKE these medications    acetaminophen 325 MG tablet Commonly known as: TYLENOL  Take 2 tablets (650 mg total) by mouth every 6 (six) hours as needed for mild pain (or Fever >/= 101).   albuterol 108 (90 Base) MCG/ACT inhaler Commonly known as: VENTOLIN HFA Inhale 2 puffs into the lungs every 4 (four) hours as needed for wheezing or shortness of breath.   amLODipine 10 MG tablet Commonly known as: NORVASC Take 1 tablet (10 mg total) by mouth daily.   atorvastatin 80 MG tablet Commonly known as: LIPITOR Take 80 mg by mouth at bedtime.   carvedilol 6.25 MG tablet Commonly known as: COREG Take 1 tablet (6.25 mg total) by mouth 2 (two) times daily with a meal.   cetirizine 10 MG tablet Commonly known as: ZYRTEC Take 10 mg by mouth daily.   cholecalciferol 25 MCG (1000 UT) tablet Take 1,000 Units by mouth daily.   ciprofloxacin 500 MG tablet Commonly known as: Cipro Take 1 tablet  (500 mg total) by mouth 2 (two) times daily. Per infectious diseases: start 10/29/2022 after completes zosyn IV On 10/28/2022. To take with metronidazole Indication: suppressive therapy for groin wounds Start taking on: October 29, 2022   clopidogrel 75 MG tablet Commonly known as: PLAVIX Take 1 tablet (75 mg total) by mouth daily.   docusate sodium 100 MG capsule Commonly known as: Colace Take 1 capsule (100 mg total) by mouth daily.   dronabinol 2.5 MG capsule Commonly known as: MARINOL Take 1 capsule (2.5 mg total) by mouth 2 (two) times daily before lunch and supper.   escitalopram 10 MG tablet Commonly known as: LEXAPRO Take 1 tablet (10 mg total) by mouth daily.   fluticasone 50 MCG/ACT nasal spray Commonly known as: FLONASE Place 1 spray into both nostrils daily as needed for allergies.   fluticasone-salmeterol 250-50 MCG/ACT Aepb Commonly known as: ADVAIR Inhale 1 puff into the lungs daily.   heparin lock flush 100 UNIT/ML Soln injection Inject 10 Units into the vein 3 (three) times daily. Use SASH method with IV ABT   hydrALAZINE 50 MG tablet Commonly known as: APRESOLINE Take 1 tablet (50 mg total) by mouth 3 (three) times daily.   insulin aspart 100 UNIT/ML injection Commonly known as: novoLOG Inject 3 Units into the skin 3 (three) times daily with meals. Hold for NPO or consuming less than 50% of meals What changed:  when to take this additional instructions   insulin aspart 100 UNIT/ML FlexPen Commonly known as: NOVOLOG Before each meal 3 times a day, 140-199 - 2 units, 200-250 - 4 units, 251-299 - 8 units,  300-349 - 10 units,  350 or above 12 units. I What changed: Another medication with the same name was changed. Make sure you understand how and when to take each.   insulin glargine 100 UNIT/ML injection Commonly known as: LANTUS Inject 0.1 mLs (10 Units total) into the skin 2 (two) times daily.   levETIRAcetam 500 MG tablet Commonly known as:  KEPPRA Take 1 tablet (500 mg total) by mouth 2 (two) times daily.   metroNIDAZOLE 500 MG tablet Commonly known as: Flagyl Take 1 tablet (500 mg total) by mouth 2 (two) times daily for 60 doses. Per infectious diseases: start 10/29/2022 after completes zosyn IV On 10/28/2022. To take with ciprofloxacin Indication: suppressive therapy for groin wounds Start taking on: October 29, 2022 What changed:  when to take this additional instructions These instructions start on October 29, 2022. If you are unsure what to do until then, ask your doctor or other care provider.  mirtazapine 7.5 MG tablet Commonly known as: REMERON Take 1 tablet (7.5 mg total) by mouth at bedtime.   multivitamin with minerals tablet Take 1 tablet by mouth daily.   nystatin cream Commonly known as: MYCOSTATIN Apply topically to rash on buttocks two times a day for 14 days.   pantoprazole 40 MG tablet Commonly known as: PROTONIX Take 1 tablet (40 mg total) by mouth daily.   piperacillin-tazobactam  IVPB Commonly known as: ZOSYN Inject 13.5 g into the vein continuous for 6 days. Infuse piperacillin/tazobactam 13.5gm daily as continuous infusion Indication:  Polymicrobial ground wound s/p recent vascular surgery with grafts First Dose: Yes Last Day of Therapy:  10/28/2022 Labs - Once weekly:  CBC/D and BMP Method of administration: Elastomeric (Continuous infusion) Method of administration may be changed at the discretion of home infusion pharmacist based upon assessment of the patient and/or caregiver's ability to self-administer the medication ordered.   polyethylene glycol 17 g packet Commonly known as: MIRALAX / GLYCOLAX Take 17 g by mouth daily. Titrate as needed for constipation. What changed: additional instructions   senna-docusate 8.6-50 MG tablet Commonly known as: Senokot-S Take 1 tablet by mouth daily.   sodium chloride flush 0.9 % Soln injection Inject 10 mLs into the vein 3 (three) times daily. Use  SASH method with IV ABT   traMADol 50 MG tablet Commonly known as: ULTRAM Take 1 tablet (50 mg total) by mouth every 8 (eight) hours as needed for moderate pain or severe pain.   traZODone 50 MG tablet Commonly known as: DESYREL Take 0.5 tablets (25 mg total) by mouth at bedtime as needed for sleep.               Durable Medical Equipment  (From admission, onward)           Start     Ordered   10/21/22 1526  For home use only DME lightweight manual wheelchair with seat cushion  Once       Comments: Patient suffers from AKA which impairs their ability to perform daily activities like grooming and toileting in the home.  A walker will not resolve  issue with performing activities of daily living. A wheelchair will allow patient to safely perform daily activities. Patient is not able to propel themselves in the home using a standard weight wheelchair due to general weakness. Patient can self propel in the lightweight wheelchair. Length of need 12 months . Accessories: elevating leg rests (ELRs), wheel locks, extensions and anti-tippers.   10/21/22 1526   10/21/22 1525  For home use only DME Hospital bed  Once       Question Answer Comment  Length of Need 12 Months   The above medical condition requires: Patient requires the ability to reposition frequently   Bed type Semi-electric      10/21/22 1526   10/21/22 1250  For home use only DME Other see comment  Once       Comments: Slide Board  Question:  Length of Need  Answer:  6 Months   10/21/22 1250              Discharge Care Instructions  (From admission, onward)           Start     Ordered   10/22/22 0000  Change dressing on IV access line weekly and PRN  (Home infusion instructions - [facility] )        10/22/22 0858   10/22/22 0000  Discharge wound care:       Comments: Change dressing daily   10/22/22 0858           Allergies  Allergen Reactions   Iodinated Contrast Media Hives    Sulfa Antibiotics Hives   Shellfish Allergy Nausea And Vomiting and Rash    Scallops specifically     Follow-up Information     [facility] Follow up.   Specialty: Home Health Services Why: They will follow up with you for your home health needs. Contact information: [address] [place] [address] [phone]         [name], FNP Follow up.   Specialty: Family Medicine Contact information: [address] [place] [address] [phone]         Waynetta Sandy, MD Follow up.   Specialties: Vascular Surgery, Cardiology Why: call to re-schedule follow up appointment Contact information: [place] [address] [phone]         Tsosie Billing, MD Follow up.   Specialty: Infectious Diseases Why: call to schedule an appointment Contact information: [place] [address] [phone]         Anabel Bene, MD Follow up.   Specialty: Neurology Why: call to schedule appointment Contact information: [place] [address] [phone]                  The results of significant diagnostics from this hospitalization (including imaging, microbiology, ancillary and laboratory) are listed below for reference.    Significant Diagnostic Studies: DG Lumbar Spine 2-3 Views  Result Date: 10/18/2022 CLINICAL DATA:  Back pain after falling out of recliner EXAM: LUMBAR SPINE - 2-3 VIEW COMPARISON:  Lumbar spine CT 08/03/2022 FINDINGS: No evidence of acute fracture or traumatic malalignment. Unchanged chronic compression deformity of T11 and T12. Advanced degenerative disc disease at L4-L5 and L5-S1. Lower lumbar facet arthropathy. Iliac vascular stents. Cholecystectomy. IMPRESSION: 1. No acute fracture or traumatic malalignment. 2. Advanced degenerative disc disease at L4-L5 and L5-S1. Electronically Signed   By: Placido Sou M.D.   On: 10/18/2022 01:27    DG Chest 2 View  Result Date: 10/17/2022 CLINICAL DATA:  Weakness. EXAM: CHEST - 2 VIEW COMPARISON:  10/01/2022. FINDINGS: Left upper extremity PICC tip overlies the SVC.The cardiomediastinal contours are normal. TAVR. Pulmonary vasculature is normal. No consolidation, pleural effusion, or pneumothorax. Mild chronic lower thoracic compression deformity no acute osseous abnormalities are seen. IMPRESSION: 1. No acute chest findings. 2. Left upper extremity PICC tip overlies the SVC. Electronically Signed   By: Keith Rake M.D.   On: 10/17/2022 21:51   Overnight EEG with video  Result Date: 10/03/2022 Lora Havens, MD     10/03/2022 10:43 PM Patient Name: Darneshia Levitz MRN: [id number] Epilepsy Attending: Lora Havens Referring Physician/Provider: Amie Portland, MD Duration: 10/02/2022 1816 to 10/03/2022 1816  Patient history: 72yo F with ams. EEG to evaluate for seizure  Level of alertness: Awake  AEDs during EEG study: None  Technical aspects: This EEG study was done with scalp electrodes positioned according to the 10-20 International system of electrode placement. Electrical activity was reviewed with band pass filter of 1-'70Hz'$ , sensitivity of 7 uV/mm, display speed of 32m/sec with a '60Hz'$  notched filter applied as appropriate. EEG data were recorded continuously and digitally stored.  Video monitoring was available and reviewed as appropriate.  Description: EEG initially showed continuous generalized 3 to 6 Hz theta-delta slowing.  Generalized periodic discharges with triphasic morphology were noted with fluctuating frequency of 1.5 to '3hz'$  Hz. IV ativan '2mg'$  was administered at 1829 on 10/02/2022. After 1831, EEG improved significantly. 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 Subsequently EEG showed posterior dominant rhythm of '7Hz'$  activity of moderate voltage (25-35 uV) seen predominantly in posterior head regions, symmetric and reactive to eye opening and eye closing. EEG  also showed intermittent generalized 3-'7hz'$  theta-delta slowing. Hyperventilation and photic stimulation were not performed.    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 IV ativan '2mg'$  was administered at [place] on 2/8/202 after which status epilepticus resolved. . Subsequently EEG was suggestive of moderate diffuse encephalopathy, nonspecific etiology.   Lora Havens   EEG adult  Result Date: 10/02/2022 Lora Havens, MD     10/02/2022  7:50 PM Patient Name: Nikhila Stoner MRN: [id number] Epilepsy Attending: Lora Havens Referring Physician/Provider: Amie Portland, MD Date: 10/02/2022 Duration: 27.03 mins Patient history: 72yo F with ams. EEG to evaluate for seizure Level of alertness: Awake AEDs during EEG study: None Technical aspects: This EEG study was done with scalp electrodes positioned according to the 10-20 International system of electrode placement. Electrical activity was reviewed with band pass filter of 1-'70Hz'$ , sensitivity of 7 uV/mm, display speed of 28m/sec with a '60Hz'$  notched filter applied as appropriate. EEG data were recorded continuously and digitally stored.  Video monitoring was available and reviewed as appropriate. Description: EEG showed continuous generalized 3 to 6 Hz theta-delta slowing. Generalized periodic discharges with triphasic morphology were noted with fluctuating frequency of 1.5 to '3hz'$  Hz. Hyperventilation and photic stimulation were not performed.   ABNORMALITY - Periodic discharges with triphasic morphology, generalized ( GPDs) - Continuous slow, generalized IMPRESSION: This study showed generalized periodic discharges with triphasic morphology with fluctuating frequency of 1.5 to '3hz'$  Hz. This eeg pattern is on the ictal-interictal continuum with high suspicion for ictal nature due to frequency of '3hz'$ . Recommend ativan challenga and  long term monitoring. Additionally there is moderate to severe diffuse encephalopathy, nonspecific etiology Dr. ARory Percywas notified. PLora Havens  CT Head Wo Contrast  Result Date: 10/01/2022 CLINICAL DATA:  Mental status change, unknown cause EXAM: CT HEAD WITHOUT CONTRAST TECHNIQUE: Contiguous axial images were obtained from the base of the skull through the vertex without intravenous contrast. RADIATION DOSE REDUCTION: This exam was performed according to the departmental dose-optimization program which includes automated exposure control, adjustment of the mA and/or kV according to patient size and/or use of iterative reconstruction technique. COMPARISON:  CT examination dated September 27, 2022 FINDINGS: Brain: No evidence of acute infarction, hemorrhage, hydrocephalus, extra-axial collection or mass lesion/mass effect. Cerebral atrophy and chronic microvascular ischemic changes of the white matter, unchanged. Vascular: No hyperdense vessel or unexpected calcification. Skull: Normal. Negative for fracture or focal lesion. Sinuses/Orbits: No acute finding. Other: None. IMPRESSION: 1. No acute intracranial abnormality. 2. Cerebral atrophy and chronic microvascular ischemic changes of the white matter, unchanged. Electronically Signed   By: IKeane PoliceD.O.   On: 10/01/2022 12:50   DG Chest Port 1 View  Result Date: 10/01/2022 CLINICAL DATA:  Sepsis. EXAM: PORTABLE CHEST 1 VIEW COMPARISON:  09/27/2022 FINDINGS: The cardiac silhouette, mediastinal and hilar contours are normal. Stable aortic calcifications. 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 Left coronary artery stent is noted. Left PICC line tip is in good position in the distal SVC. The lungs are clear of  an acute process. No infiltrates or effusions. The bony thorax is intact. IMPRESSION: No acute cardiopulmonary findings. Electronically Signed   By: Marijo Sanes M.D.   On: 10/01/2022 12:34   EEG adult  Result Date: 09/28/2022 Lora Havens, MD      09/28/2022  8:37 AM Patient Name: Tulsi Graeter MRN: [id number] Epilepsy Attending: Lora Havens Referring Physician/Provider: Lorenza Chick, MD Date: 09/28/2022 Duration: 29.41 mins Patient history: 72yo M with ams. EEG to evaluate for seizure. Level of alertness: Awake, asleep AEDs during EEG study: None Technical aspects: This EEG study was done with scalp electrodes positioned according to the 10-20 International system of electrode placement. Electrical activity was reviewed with band pass filter of 1-'70Hz'$ , sensitivity of 7 uV/mm, display speed of 65m/sec with a '60Hz'$  notched filter applied as appropriate. EEG data were recorded continuously and digitally stored.  Video monitoring was available and reviewed as appropriate. Description: The posterior dominant rhythm consists of 7.5 Hz activity of moderate voltage (25-35 uV) seen predominantly in posterior head regions, symmetric and reactive to eye opening and eye closing. Sleep was characterized by vertex waves, sleep spindles (12 to 14 Hz), maximal frontocentral region. EEG showed intermittent generalized polymorphic sharply contoured 3 to 6 Hz theta-delta slowing, at times with triphasic morphology . Hyperventilation and photic stimulation were not performed.   ABNORMALITY - Intermittent slow, generalized IMPRESSION: This study is suggestive of mild to moderate diffuse encephalopathy, nonspecific etiology but could be related to toxic-metabolic causes. No seizures or definite epileptiform discharges were seen throughout the recording. PLora Havens  MR BRAIN WO CONTRAST  Result Date: 09/27/2022 CLINICAL DATA:  Altered mental status EXAM: MRI HEAD WITHOUT CONTRAST TECHNIQUE: Multiplanar, multiecho pulse sequences of the brain and surrounding structures were obtained without intravenous contrast. COMPARISON:  08/04/2022 FINDINGS: Brain: No acute infarct, mass effect or extra-axial collection. Old blood products at the midline posterior fossa. There is  multifocal hyperintense T2-weighted signal within the white matter. Generalized volume loss. The midline structures are normal. 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 Vascular: Normal flow voids. Skull and upper cervical spine: Normal marrow signal. Sinuses/Orbits: Bilateral mastoid fluid. Paranasal sinuses are clear. Bilateral ocular lens replacements. Other: None IMPRESSION: 1. No acute intracranial abnormality. 2. 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 Electronically Signed   By: KUlyses JarredM.D.   On: 09/27/2022 23:32   DG Chest 2 View  Result Date: 09/27/2022 CLINICAL DATA:  Altered level of consciousness EXAM: CHEST - 2 VIEW COMPARISON:  09/18/2022 FINDINGS: Frontal and lateral views of the chest demonstrates stable aortic valve prosthesis. Left-sided PICC, tip in the region of the superior vena cava. The cardiac silhouette is unremarkable. No airspace disease, effusion, or pneumothorax. No acute bony abnormalities. IMPRESSION: 1. No acute intrathoracic process. Electronically Signed   By: MRanda NgoM.D.   On: 09/27/2022 17:46   CT Head Wo Contrast  Result Date: 09/27/2022 CLINICAL DATA:  Mental status change. EXAM: CT HEAD WITHOUT CONTRAST TECHNIQUE: Contiguous axial images were obtained from the base of the skull through the vertex without intravenous contrast. RADIATION DOSE REDUCTION: This exam was performed according to the departmental dose-optimization program which includes automated exposure control, adjustment of the mA and/or kV according to patient size and/or use of iterative reconstruction technique. COMPARISON:  August 16, 2022 FINDINGS: Brain: No evidence of acute hemorrhage, hydrocephalus, extra-axial collection or mass lesion/mass effect. Moderate brain parenchymal volume loss and deep white matter microangiopathy.  Small areas of hypoattenuation in the right cerebellum with uncertain significance. Vascular: No  hyperdense vessel or unexpected calcification. Skull: Normal. Negative for fracture or focal lesion. Sinuses/Orbits: Mucosal thickening of the right maxillary sinus. Other: None. IMPRESSION: No evidence of acute hemorrhage. Small areas of hypoattenuation in the right cerebellum with uncertain significance. These may represent age-indeterminate infarcts. Moderate brain parenchymal volume loss and deep white matter microangiopathy. Electronically Signed   By: Fidela Salisbury M.D.   On: 09/27/2022 17:44    Microbiology: No results found for this or any previous visit (from the past 240 hour(s)).   Labs: Basic Metabolic Panel: Recent Labs  Lab 10/17/22 2127 10/18/22 0424  NA 137 138  K 3.6 3.5  CL 102 105  CO2 24 27  GLUCOSE 247* 171*  BUN 16 14  CREATININE 0.80 0.65  CALCIUM 9.3 9.0   Liver Function Tests: No results for input(s): "AST", "ALT", "ALKPHOS", "BILITOT", "PROT", "ALBUMIN" in the last 168 hours. No results for input(s): "LIPASE", "AMYLASE" in the last 168 hours. No results for input(s): "AMMONIA" in the last 168 hours. CBC: Recent Labs  Lab 10/17/22 2127 10/18/22 0424  WBC 9.4 6.8  HGB 11.0* 9.6*  HCT 35.3* 30.5*  MCV 91.5 90.0  PLT 453* 412*   Cardiac Enzymes: No results for input(s): "CKTOTAL", "CKMB", "CKMBINDEX", "TROPONINI" in the last 168 hours. BNP: BNP (last 3 results) Recent Labs    08/16/22 0434  BNP 672.4*    ProBNP (last 3 results) No results for input(s): "PROBNP" in the last 8760 hours.  CBG: Recent Labs  Lab 10/21/22 0844 10/21/22 1148 10/21/22 1614 10/21/22 2114 10/22/22 0803  GLUCAP 107* 231* 102* 210* 158*       Signed:  [name] MD.  [facility] 10/22/2022, 8:58 AM

## 2022-10-22 NOTE — TOC Progression Note (Addendum)
Transition of Care [facility]) - Progression Note    Patient Details  Name: Heather [name] MRN: [id number] Date of Birth: 1951/07/11  Transition of Care [facility] CM/SW [place], LCSW Phone Number: 10/22/2022, 10:39 AM  Clinical Narrative:  Wheelchair and sliding board delivered to the room yesterday. [employer] is trying to reach patient regarding hospital bed delivery. Asked them to call her room phone. Husband's phone is off. CSW and family unable to reach him but patient said her neighbor told her that his car is in the driveway. With patient's permission, called and updated daughter [name]. She expressed concern about patient returning home but understands that patient can make her own decisions. If unable to reach patient's husband, may set up EMS transport home. Address on facesheet is correct.  11:26 am: Called daughter. Her stepbrother will go to the home to check on patient's husband. Family will come to the hospital to pick up her wheelchair and follow EMS home. [employer] spoke to patient about hospital bed. She will call them back later to let them know if she wants it or not.  Expected Discharge Plan: Skilled Nursing Facility Barriers to Discharge: Ship broker, Continued Medical Work up  Expected Discharge Plan and Services     Post Acute Care Choice: [place] Living arrangements for the past 2 months: Single Family Home Expected Discharge Date: 10/22/22                                     Social Determinants of Health (SDOH) Interventions SDOH Screenings   Food Insecurity: No Food Insecurity (10/18/2022)  Recent Concern: [place] Present (08/11/2022)  Housing: Low Risk  (10/18/2022)  Transportation Needs: No Transportation Needs (10/18/2022)  Utilities: Not At Risk (10/18/2022)  Financial Resource Strain: Medium Risk (10/19/2018)  Physical Activity: Inactive (10/19/2018)  Social Connections: Unknown (10/19/2018)   Tobacco Use: High Risk (10/17/2022)    Readmission Risk Interventions    11/13/2020    2:27 PM  Readmission Risk Prevention Plan  Transportation Screening Complete  PCP or Specialist Appt within 3-5 Days Complete  HRI or [place] Complete  Social Work Consult for [place] Planning/Counseling Complete  Palliative Care Screening Not Applicable  Medication Review Press photographer) Complete

## 2022-10-22 NOTE — TOC Transition Note (Signed)
Transition of Care [facility]) - CM/SW Discharge Note   Patient Details  Name: Heather [name] MRN: [id number] Date of Birth: [birth year]  [facility] CM/SW Contact:  Candie Chroman, LCSW Phone Number: 10/22/2022, 4:25 PM   Clinical Narrative: Patient has orders to discharge home today. [place] and [place] liaisons are aware. Consult placed for IV team to exchange PICC from double lumen to single lumen to allow for more simplicity and success at home. Once PICC placed, RN will set up EMS transport home. Paperwork in discharge packet. No further concerns. CSW signing off.    Final next level of care: [place] Barriers to Discharge: Barriers Resolved   Patient Goals and CMS Choice   Choice offered to / list presented to : Patient  Discharge Placement                  Patient to be transferred to facility by: EMS Name of family member notified: Mickle Plumb Patient and family notified of of transfer: 10/22/22  Discharge Plan and Services Additional resources added to the After Visit Summary for       Post Acute Care Choice: [place]          DME Arranged: Youth worker wheelchair with seat cushion, Hospital bed, Other see comment (Slide board) DME Agency: [employer] Date DME Agency Contacted: 10/22/22   Representative spoke with at DME Agency: Janett Billow HH Arranged: RN, PT, OT, Nurse's Aide, Social Work, IV Antibiotics HH Agency: [place], [employer] Date [place]: 10/22/22   Representative spoke with at [place]: [place]: [place], [place]: Carolynn Sayers  Social Determinants of Health (SDOH) Interventions SDOH Screenings   Food Insecurity: No Food Insecurity (10/18/2022)  Recent Concern: [place] Present (08/11/2022)  Housing: [place]  (10/18/2022)  Transportation Needs: No Transportation Needs (10/18/2022)  Utilities: Not At Risk (10/18/2022)  Financial Resource  Strain: Medium Risk (10/19/2018)  Physical Activity: Inactive (10/19/2018)  Social Connections: Unknown (10/19/2018)  Tobacco Use: High Risk (10/17/2022)     Readmission Risk Interventions    11/13/2020    2:27 PM  Readmission Risk Prevention Plan  Transportation Screening Complete  PCP or Specialist Appt within 3-5 Days Complete  HRI or [place] Complete  Social Work Consult for [place] Planning/Counseling Complete  Palliative Care Screening Not Applicable  Medication Review Press photographer) Complete

## 2022-10-22 NOTE — Telephone Encounter (Signed)
Patient still admitted 

## 2022-10-23 ENCOUNTER — Emergency Department: Payer: Medicare HMO

## 2022-10-23 ENCOUNTER — Inpatient Hospital Stay
Admission: EM | Admit: 2022-10-23 | Discharge: 2022-10-31 | DRG: 914 | Disposition: A | Discharge status: Skilled Nursing Facility | Payer: Medicare HMO | Attending: Internal Medicine | Admitting: Internal Medicine

## 2022-10-23 DIAGNOSIS — I739 Peripheral vascular disease, unspecified: Secondary | ICD-10-CM | POA: Diagnosis not present

## 2022-10-23 DIAGNOSIS — E1142 Type 2 diabetes mellitus with diabetic polyneuropathy: Secondary | ICD-10-CM | POA: Diagnosis present

## 2022-10-23 DIAGNOSIS — F209 Schizophrenia, unspecified: Secondary | ICD-10-CM | POA: Diagnosis present

## 2022-10-23 DIAGNOSIS — R531 Weakness: Secondary | ICD-10-CM

## 2022-10-23 DIAGNOSIS — I5032 Chronic diastolic (congestive) heart failure: Secondary | ICD-10-CM | POA: Diagnosis present

## 2022-10-23 DIAGNOSIS — F319 Bipolar disorder, unspecified: Secondary | ICD-10-CM | POA: Diagnosis present

## 2022-10-23 DIAGNOSIS — Z794 Long term (current) use of insulin: Secondary | ICD-10-CM

## 2022-10-23 DIAGNOSIS — F17200 Nicotine dependence, unspecified, uncomplicated: Secondary | ICD-10-CM | POA: Diagnosis not present

## 2022-10-23 DIAGNOSIS — Z8679 Personal history of other diseases of the circulatory system: Secondary | ICD-10-CM

## 2022-10-23 DIAGNOSIS — T8141XA Infection following a procedure, superficial incisional surgical site, initial encounter: Secondary | ICD-10-CM | POA: Diagnosis present

## 2022-10-23 DIAGNOSIS — Z85828 Personal history of other malignant neoplasm of skin: Secondary | ICD-10-CM | POA: Diagnosis not present

## 2022-10-23 DIAGNOSIS — Z882 Allergy status to sulfonamides status: Secondary | ICD-10-CM

## 2022-10-23 DIAGNOSIS — Z91013 Allergy to seafood: Secondary | ICD-10-CM

## 2022-10-23 DIAGNOSIS — L89152 Pressure ulcer of sacral region, stage 2: Secondary | ICD-10-CM | POA: Diagnosis present

## 2022-10-23 DIAGNOSIS — S31109D Unspecified open wound of abdominal wall, unspecified quadrant without penetration into peritoneal cavity, subsequent encounter: Secondary | ICD-10-CM | POA: Diagnosis not present

## 2022-10-23 DIAGNOSIS — I252 Old myocardial infarction: Secondary | ICD-10-CM | POA: Diagnosis not present

## 2022-10-23 DIAGNOSIS — L89322 Pressure ulcer of left buttock, stage 2: Secondary | ICD-10-CM | POA: Diagnosis present

## 2022-10-23 DIAGNOSIS — E876 Hypokalemia: Secondary | ICD-10-CM | POA: Diagnosis present

## 2022-10-23 DIAGNOSIS — Z87898 Personal history of other specified conditions: Secondary | ICD-10-CM | POA: Diagnosis not present

## 2022-10-23 DIAGNOSIS — R569 Unspecified convulsions: Secondary | ICD-10-CM

## 2022-10-23 DIAGNOSIS — M25561 Pain in right knee: Secondary | ICD-10-CM | POA: Diagnosis not present

## 2022-10-23 DIAGNOSIS — R7401 Elevation of levels of liver transaminase levels: Secondary | ICD-10-CM | POA: Diagnosis not present

## 2022-10-23 DIAGNOSIS — W07XXXA Fall from chair, initial encounter: Secondary | ICD-10-CM | POA: Diagnosis present

## 2022-10-23 DIAGNOSIS — Z89612 Acquired absence of left leg above knee: Secondary | ICD-10-CM

## 2022-10-23 DIAGNOSIS — E1165 Type 2 diabetes mellitus with hyperglycemia: Secondary | ICD-10-CM | POA: Diagnosis present

## 2022-10-23 DIAGNOSIS — S31109A Unspecified open wound of abdominal wall, unspecified quadrant without penetration into peritoneal cavity, initial encounter: Secondary | ICD-10-CM | POA: Diagnosis present

## 2022-10-23 DIAGNOSIS — I700 Atherosclerosis of aorta: Secondary | ICD-10-CM | POA: Diagnosis present

## 2022-10-23 DIAGNOSIS — Z955 Presence of coronary angioplasty implant and graft: Secondary | ICD-10-CM

## 2022-10-23 DIAGNOSIS — S8981XA Other specified injuries of right lower leg, initial encounter: Secondary | ICD-10-CM | POA: Diagnosis present

## 2022-10-23 DIAGNOSIS — I25118 Atherosclerotic heart disease of native coronary artery with other forms of angina pectoris: Secondary | ICD-10-CM | POA: Diagnosis not present

## 2022-10-23 DIAGNOSIS — E1151 Type 2 diabetes mellitus with diabetic peripheral angiopathy without gangrene: Secondary | ICD-10-CM | POA: Diagnosis present

## 2022-10-23 DIAGNOSIS — Z953 Presence of xenogenic heart valve: Secondary | ICD-10-CM

## 2022-10-23 DIAGNOSIS — E860 Dehydration: Secondary | ICD-10-CM | POA: Diagnosis present

## 2022-10-23 DIAGNOSIS — N179 Acute kidney failure, unspecified: Secondary | ICD-10-CM | POA: Diagnosis present

## 2022-10-23 DIAGNOSIS — E1159 Type 2 diabetes mellitus with other circulatory complications: Secondary | ICD-10-CM | POA: Diagnosis not present

## 2022-10-23 DIAGNOSIS — I629 Nontraumatic intracranial hemorrhage, unspecified: Secondary | ICD-10-CM

## 2022-10-23 DIAGNOSIS — I2510 Atherosclerotic heart disease of native coronary artery without angina pectoris: Secondary | ICD-10-CM | POA: Diagnosis present

## 2022-10-23 DIAGNOSIS — I10 Essential (primary) hypertension: Secondary | ICD-10-CM | POA: Diagnosis not present

## 2022-10-23 DIAGNOSIS — Z79899 Other long term (current) drug therapy: Secondary | ICD-10-CM

## 2022-10-23 DIAGNOSIS — Z8673 Personal history of transient ischemic attack (TIA), and cerebral infarction without residual deficits: Secondary | ICD-10-CM | POA: Diagnosis not present

## 2022-10-23 DIAGNOSIS — J449 Chronic obstructive pulmonary disease, unspecified: Secondary | ICD-10-CM | POA: Diagnosis present

## 2022-10-23 DIAGNOSIS — Y838 Other surgical procedures as the cause of abnormal reaction of the patient, or of later complication, without mention of misadventure at the time of the procedure: Secondary | ICD-10-CM | POA: Diagnosis present

## 2022-10-23 DIAGNOSIS — G40909 Epilepsy, unspecified, not intractable, without status epilepticus: Secondary | ICD-10-CM | POA: Diagnosis present

## 2022-10-23 DIAGNOSIS — M545 Low back pain, unspecified: Secondary | ICD-10-CM | POA: Diagnosis present

## 2022-10-23 DIAGNOSIS — Z91041 Radiographic dye allergy status: Secondary | ICD-10-CM

## 2022-10-23 DIAGNOSIS — E119 Type 2 diabetes mellitus without complications: Secondary | ICD-10-CM

## 2022-10-23 DIAGNOSIS — Z9049 Acquired absence of other specified parts of digestive tract: Secondary | ICD-10-CM

## 2022-10-23 DIAGNOSIS — W19XXXA Unspecified fall, initial encounter: Secondary | ICD-10-CM | POA: Diagnosis not present

## 2022-10-23 DIAGNOSIS — E785 Hyperlipidemia, unspecified: Secondary | ICD-10-CM | POA: Diagnosis present

## 2022-10-23 DIAGNOSIS — Z7951 Long term (current) use of inhaled steroids: Secondary | ICD-10-CM

## 2022-10-23 DIAGNOSIS — Y92009 Unspecified place in unspecified non-institutional (private) residence as the place of occurrence of the external cause: Secondary | ICD-10-CM

## 2022-10-23 DIAGNOSIS — I110 Hypertensive heart disease with heart failure: Secondary | ICD-10-CM | POA: Diagnosis present

## 2022-10-23 DIAGNOSIS — Z7902 Long term (current) use of antithrombotics/antiplatelets: Secondary | ICD-10-CM

## 2022-10-23 DIAGNOSIS — M5030 Other cervical disc degeneration, unspecified cervical region: Secondary | ICD-10-CM | POA: Diagnosis present

## 2022-10-23 DIAGNOSIS — Z885 Allergy status to narcotic agent status: Secondary | ICD-10-CM

## 2022-10-23 DIAGNOSIS — Z720 Tobacco use: Secondary | ICD-10-CM

## 2022-10-23 LAB — CBC WITH DIFFERENTIAL/PLATELET
Abs Immature Granulocytes: 0.05 K/uL (ref 0.00–0.07)
Basophils Absolute: 0.1 K/uL (ref 0.0–0.1)
Basophils Relative: 1 %
Eosinophils Absolute: 0.3 K/uL (ref 0.0–0.5)
Eosinophils Relative: 3 %
HCT: 33.8 % — ABNORMAL LOW (ref 36.0–46.0)
Hemoglobin: 10.5 g/dL — ABNORMAL LOW (ref 12.0–15.0)
Immature Granulocytes: 1 %
Lymphocytes Relative: 9 %
Lymphs Abs: 1 K/uL (ref 0.7–4.0)
MCH: 28.5 pg (ref 26.0–34.0)
MCHC: 31.1 g/dL (ref 30.0–36.0)
MCV: 91.8 fL (ref 80.0–100.0)
Monocytes Absolute: 0.6 K/uL (ref 0.1–1.0)
Monocytes Relative: 6 %
Neutro Abs: 8.8 K/uL — ABNORMAL HIGH (ref 1.7–7.7)
Neutrophils Relative %: 80 %
Platelets: 449 K/uL — ABNORMAL HIGH (ref 150–400)
RBC: 3.68 MIL/uL — ABNORMAL LOW (ref 3.87–5.11)
RDW: 16.4 % — ABNORMAL HIGH (ref 11.5–15.5)
WBC: 10.9 K/uL — ABNORMAL HIGH (ref 4.0–10.5)
nRBC: 0 % (ref 0.0–0.2)

## 2022-10-23 LAB — COMPREHENSIVE METABOLIC PANEL
ALT: 165 U/L — ABNORMAL HIGH (ref 0–44)
AST: 72 U/L — ABNORMAL HIGH (ref 15–41)
Albumin: 2.8 g/dL — ABNORMAL LOW (ref 3.5–5.0)
Alkaline Phosphatase: 342 U/L — ABNORMAL HIGH (ref 38–126)
Anion gap: 8 (ref 5–15)
BUN: 12 mg/dL (ref 8–23)
CO2: 28 mmol/L (ref 22–32)
Calcium: 9.3 mg/dL (ref 8.9–10.3)
Chloride: 101 mmol/L (ref 98–111)
Creatinine, Ser: 0.6 mg/dL (ref 0.44–1.00)
GFR, Estimated: >60 mL/min (ref >60)
Glucose, Bld: 156 mg/dL — ABNORMAL HIGH (ref 70–99)
Potassium: 3.4 mmol/L — ABNORMAL LOW (ref 3.5–5.1)
Sodium: 137 mmol/L (ref 135–145)
Total Bilirubin: 0.5 mg/dL (ref 0.3–1.2)
Total Protein: 6.6 g/dL (ref 6.5–8.1)

## 2022-10-23 LAB — SEDIMENTATION RATE: Sed Rate: 58 mm/hr — ABNORMAL HIGH (ref 0–30)

## 2022-10-23 MED ORDER — ATORVASTATIN CALCIUM 20 MG PO TABS
80.0000 mg | ORAL_TABLET | Freq: Every day | ORAL | Status: DC
  Administered 2022-10-24 – 2022-10-30 (×8): 80 mg via ORAL
  Filled 2022-10-23: qty 4
  Filled 2022-10-23: qty 1
  Filled 2022-10-23: qty 4
  Filled 2022-10-23 (×3): qty 1
  Filled 2022-10-23 (×2): qty 4

## 2022-10-23 MED ORDER — LACTATED RINGERS IV SOLN: INTRAVENOUS | Status: DC

## 2022-10-23 MED ORDER — HYDRALAZINE HCL 20 MG/ML IJ SOLN: 5.0000 mg | INTRAMUSCULAR | Status: DC | PRN

## 2022-10-23 MED ORDER — PIPERACILLIN-TAZOBACTAM 3.375 G IVPB
3.3750 g | Freq: Three times a day (TID) | INTRAVENOUS | Status: AC
  Administered 2022-10-24 – 2022-10-28 (×15): 3.375 g via INTRAVENOUS
  Filled 2022-10-23 (×14): qty 50

## 2022-10-23 MED ORDER — AMLODIPINE BESYLATE 10 MG PO TABS
10.0000 mg | ORAL_TABLET | Freq: Every day | ORAL | Status: DC
  Administered 2022-10-24 – 2022-10-31 (×8): 10 mg via ORAL
  Filled 2022-10-23: qty 1
  Filled 2022-10-23: qty 2
  Filled 2022-10-23: qty 1
  Filled 2022-10-23 (×2): qty 2
  Filled 2022-10-23: qty 1
  Filled 2022-10-23: qty 2
  Filled 2022-10-23: qty 1

## 2022-10-23 MED ORDER — LORAZEPAM 1 MG PO TABS
1.0000 mg | ORAL_TABLET | Freq: Once | ORAL | Status: AC
  Administered 2022-10-23: 1 mg via ORAL
  Filled 2022-10-23: qty 1

## 2022-10-23 MED ORDER — SODIUM CHLORIDE 0.9 % IV BOLUS
500.0000 mL | Freq: Once | INTRAVENOUS | Status: AC
  Administered 2022-10-23: 500 mL via INTRAVENOUS

## 2022-10-23 MED ORDER — LEVETIRACETAM 500 MG PO TABS
500.0000 mg | ORAL_TABLET | Freq: Two times a day (BID) | ORAL | Status: DC
  Administered 2022-10-24 – 2022-10-31 (×15): 500 mg via ORAL
  Filled 2022-10-23 (×15): qty 1

## 2022-10-23 MED ORDER — SODIUM CHLORIDE 0.9% FLUSH
3.0000 mL | Freq: Two times a day (BID) | INTRAVENOUS | Status: DC
  Administered 2022-10-24 – 2022-10-29 (×10): 3 mL via INTRAVENOUS

## 2022-10-23 MED ORDER — NICOTINE 14 MG/24HR TD PT24
14.0000 mg | MEDICATED_PATCH | Freq: Every day | TRANSDERMAL | Status: DC
  Administered 2022-10-24 – 2022-10-31 (×9): 14 mg via TRANSDERMAL
  Filled 2022-10-23 (×9): qty 1

## 2022-10-23 MED ORDER — ESCITALOPRAM OXALATE 10 MG PO TABS
10.0000 mg | ORAL_TABLET | Freq: Every day | ORAL | Status: DC
  Administered 2022-10-24 – 2022-10-31 (×8): 10 mg via ORAL
  Filled 2022-10-23 (×8): qty 1

## 2022-10-23 MED ORDER — CARVEDILOL 6.25 MG PO TABS
6.2500 mg | ORAL_TABLET | Freq: Two times a day (BID) | ORAL | Status: DC
  Administered 2022-10-24 – 2022-10-31 (×15): 6.25 mg via ORAL
  Filled 2022-10-23 (×15): qty 1

## 2022-10-23 MED ORDER — GADOBUTROL 1 MMOL/ML IV SOLN
5.0000 mL | Freq: Once | INTRAVENOUS | Status: AC | PRN
  Administered 2022-10-23: 5 mL via INTRAVENOUS

## 2022-10-23 MED ORDER — HYDRALAZINE HCL 50 MG PO TABS
50.0000 mg | ORAL_TABLET | Freq: Three times a day (TID) | ORAL | Status: DC
  Administered 2022-10-24 – 2022-10-31 (×23): 50 mg via ORAL
  Filled 2022-10-23 (×24): qty 1

## 2022-10-23 MED ORDER — ADULT MULTIVITAMIN W/MINERALS CH
1.0000 | ORAL_TABLET | Freq: Every day | ORAL | Status: DC
  Administered 2022-10-25 – 2022-10-31 (×3): 1 via ORAL
  Filled 2022-10-23 (×7): qty 1

## 2022-10-23 MED ORDER — FLUTICASONE FUROATE-VILANTEROL 200-25 MCG/ACT IN AEPB
1.0000 | INHALATION_SPRAY | Freq: Every day | RESPIRATORY_TRACT | Status: DC
  Administered 2022-10-24 – 2022-10-31 (×8): 1 via RESPIRATORY_TRACT
  Filled 2022-10-23: qty 28

## 2022-10-23 MED ORDER — INSULIN DETEMIR 100 UNIT/ML ~~LOC~~ SOLN
10.0000 [IU] | Freq: Two times a day (BID) | SUBCUTANEOUS | Status: DC
  Administered 2022-10-24 – 2022-10-31 (×16): 10 [IU] via SUBCUTANEOUS
  Filled 2022-10-23 (×16): qty 0.1

## 2022-10-23 MED ORDER — SODIUM CHLORIDE 0.9 % IV SOLN: Freq: Once | INTRAVENOUS | Status: AC

## 2022-10-23 MED ORDER — INSULIN ASPART 100 UNIT/ML IJ SOLN
0.0000 [IU] | INTRAMUSCULAR | Status: DC
  Administered 2022-10-24: 3 [IU] via SUBCUTANEOUS
  Filled 2022-10-23 (×2): qty 1

## 2022-10-23 NOTE — Assessment & Plan Note (Signed)
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  Per Dr. Leonie Man : Dr. Leonie Man, neurology on 12/19.  From a stroke perspective, patient only needs a single antiplatelet agent.  If vascular surgery wishes to continue Plavix then he recommends discontinuing aspirin.  Communicated with Dr. Donzetta Matters, [place] to continue Plavix. ASA DC'ed.

## 2022-10-23 NOTE — Assessment & Plan Note (Signed)
Stable we will continue patient on Coreg atorvastatin hydralazine and amlodipine.

## 2022-10-23 NOTE — H&P (Signed)
History and Physical    Chief Complaint: Fall    HISTORY OF PRESENT ILLNESS: Heather [name] is an 72 y.o. female seen in the emergency room today after a fall and has complaints of back pain.  She was transferring from the recliner to wheelchair when she accidentally slid off to the floor and fell on her bottom and her lower back started to spasm.  Patient unfortunately has had multiple hospitalizations after infection of a vascular procedure complicated by bilateral inguinal wound infection that she had.  Patient declined placement and is now agreeable.  No reports of incontinence seizures numbness tingling paresthesia saddle numbness bandlike pain.  Patient does report generalized weakness and fatigue.  Chart review shows patient was last admitted on 24 February and discharged on the 28th. She was admitted for bilateral inguinal wound infections and sent to the nursing home for rehab patient signed out [place] and left rehab facility at that time. Patient was to follow-up with ID and neurology for new seizures. Per chart review patient was in the hospital from August 04, 2022 to September 22, 2022 initially admitted to the ICU for hemorrhagic cerebral stroke and bilateral lower extremity ischemia.  Patient underwent bilateral lower extremity femoral endarterectomy and angioplasty with iliac stenting and 4 compartment fasciotomies.  Plan upon discharge on the 28th was to continue Zosyn and then transition to Cipro and Flagyl.  Patient has a PICC in the left arm.  Pt has past medical history as below: Past Medical History:  Diagnosis Date   (HFpEF) heart failure with preserved ejection fraction (HCC)    Angina pectoris (HCC)    Anxiety    Aortic atherosclerosis (HCC)    Basal cell carcinoma    Bilateral carotid artery disease (HCC)    a.) carotid doppler 03/18/2021: [id number] RICA, [id number] LICA   Bipolar disorder (HCC)    Cervicalgia    Chronic mesenteric ischemia (HCC)     a.) s/p PTA 11/13/2020 --> 6 x 16 mm lifestream ballon expanding stent to SMA   COPD (chronic obstructive pulmonary disease) (HCC)    Coronary artery disease 03/20/2014   a.) LHC/PCI 03/20/2014: 70% mLAD (2.5 x 28 mm Xience Alpine DES), 70% dLCx, 70% dRCA; b.) LHC 01/25/2016: 90% ISR mLAD --> mLAD dissection with in stent --> 2.75 x 22 mm Resolute DES; c.) R/LHC 09/29/2016: 50% ISR mLAD, 70% dLAD, mPA 37, PCWP 24, AO sat 90%, PA sat 46%, CO 3.7, CI 2.46 - med mgmt   DDD (degenerative disc disease), cervical    Depression    Diabetic polyneuropathy (HCC)    GERD (gastroesophageal reflux disease)    Hyperlipidemia    Hypertension    Long term current use of antithrombotics/antiplatelets    a.) on DAPT (ASA + clopidogrel)   Memory loss    NSTEMI (non-ST elevated myocardial infarction) ([place]) 07/2016   a.) troponins trended (normal < 0.034 ng/mL): 0.325 --> 1.480 --> 2.330 --> 1.660 --> 0.169 ng/mL   PAD (peripheral artery disease) ([place])    a.) s/p PTA and stenting SMA 11/13/2020; b.) s/p PTA and stenting of RIGHT SFA and popliteal 04/12/2021; c.) s/p PTA and kissing balloon stents to BILATERAL CIAs 05/28/2021   Respiratory failure with hypoxia (HCC)    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  All other systems reviewed and are negative.  Allergies  Allergen Reactions   Iodinated Contrast Media Hives   Tramadol Hcl Other (See Comments)    SEIZURE   Sulfa Antibiotics Hives   Shellfish Allergy Nausea And Vomiting and Rash    Scallops specifically    Past Surgical History:  Procedure Laterality Date   AMPUTATION Left 08/14/2022   Procedure: ABOVE KNEE AMPUTATION;  Surgeon: Waynetta Sandy, MD;  Location: [place] [facility];  Service: Vascular;  Laterality: Left;   AORTIC VALVE REPLACEMENT  09/30/2016   Procedure: TRANSCATHETER AORTIC VALVE REPAIR   APPLICATION OF WOUND VAC Bilateral 08/14/2022   Procedure: APPLICATION OF WOUND VAC;  Surgeon: Waynetta Sandy, MD;  Location: [facility];  Service: Vascular;  Laterality: Bilateral;   BLADDER SURGERY     CATARACT EXTRACTION, BILATERAL     CHOLECYSTECTOMY     COLONOSCOPY     CORONARY ANGIOPLASTY WITH STENT PLACEMENT  03/20/2014   Procedure: CORONARY ANGIOPLASTY WITH STENT PLACEMENT; Location: [facility]; Surgeon: Jacques Earthly, MD   CORONARY ANGIOPLASTY WITH STENT PLACEMENT Left 01/25/2016   Procedure: CORONARY ANGIOPLASTY WITH STENT PLACEMENT; Location: [facility]; Surgeon: Jacques Earthly, MD   ENDARTERECTOMY FEMORAL Bilateral 08/04/2022   Procedure: BILATERAL FEMORAL ENDARTERECTOMY;  Surgeon: Waynetta Sandy, MD;  Location: [facility];  Service: Vascular;  Laterality: Bilateral;   FASCIECTOMY Bilateral 08/04/2022   Procedure: BILATERAL LOWER EXTREMITY FASCIECTOMIES;  Surgeon: Waynetta Sandy, MD;  Location: [facility];  Service: Vascular;  Laterality: Bilateral;   FOOT SURGERY Right    GROIN DEBRIDEMENT Bilateral 08/14/2022   Procedure: GROIN DEBRIDEMENT;  Surgeon: Waynetta Sandy, MD;  Location: [facility];  Service: Vascular;  Laterality: Bilateral;   GROIN DEBRIDEMENT Bilateral 09/16/2022   Procedure: GROIN DEBRIDEMENT POSSIBLE VAC PLACMENT;  Surgeon: Broadus John, MD;  Location: [facility];  Service: Vascular;  Laterality: Bilateral;   HEMORRHOID SURGERY     INSERTION OF ILIAC STENT Bilateral 08/04/2022   Procedure: INSERTION OF BILATERAL ILIAC STENTS;  Surgeon: Waynetta Sandy, MD;  Location: [facility];  Service: Vascular;  Laterality: Bilateral;   IRRIGATION AND DEBRIDEMENT HEMATOMA Left 07/08/2014   GROIN   LOWER EXTREMITY ANGIOGRAM Bilateral 08/04/2022   Procedure: LOWER EXTREMITY ANGIOGRAM;   Surgeon: Waynetta Sandy, MD;  Location: [facility];  Service: Vascular;  Laterality: Bilateral;   LOWER EXTREMITY ANGIOGRAPHY Right 04/12/2021   Procedure: Lower Extremity Angiography;  Surgeon: [name], MD;  Location: [facility];  Service: Cardiovascular;  Laterality: Right;   LOWER EXTREMITY ANGIOGRAPHY Left 05/28/2021   Procedure: LOWER EXTREMITY ANGIOGRAPHY;  Surgeon: [name], MD;  Location: [facility];  Service: Cardiovascular;  Laterality: Left;   LOWER EXTREMITY ANGIOGRAPHY Left 03/25/2022   Procedure: Lower Extremity Angiography;  Surgeon: [name], MD;  Location: [facility];  Service: Cardiovascular;  Laterality: Left;   PATCH ANGIOPLASTY  08/04/2022   Procedure: LEFT FEMORAL PATCH ANGIOPLASTY USING XENOSURE BIOLOGIC PATCH;  Surgeon: Waynetta Sandy, MD;  Location: [facility];  Service: Vascular;;   PSEUDOANEURYSM REPAIR Left 06/30/2014   FEMORAL ARTERY   RIGHT/LEFT HEART CATH AND CORONARY ANGIOGRAPHY Bilateral 09/29/2016   Procedure: RIGHT/LEFT HEART CATH AND CORONARY ANGIOGRAPHY; Location [facility]; Surgeon: Jacques Earthly, MD   THROMBECTOMY FEMORAL ARTERY Bilateral 08/04/2022   Procedure: BILATERAL LOWER EXTREMITY THROMBECTOMY;  Surgeon: Waynetta Sandy, MD;  Location: [facility];  Service: Vascular;  Laterality: Bilateral;   TUBAL LIGATION     VISCERAL ANGIOGRAPHY N/A 11/13/2020   Procedure: VISCERAL ANGIOGRAPHY;  Surgeon: [name], MD;  Location: [facility];  Service: Cardiovascular;  Laterality: N/A;       MEDICATIONS: No current facility-administered medications on file prior to encounter.   Current Outpatient Medications on File Prior to Encounter  Medication Sig Dispense Refill   acetaminophen (TYLENOL) 325 MG tablet Take 2 tablets (650 mg total) by mouth every 6 (six) hours as needed for mild pain (or Fever >/= 101).     albuterol (VENTOLIN HFA) 108 (90 Base) MCG/ACT inhaler  Inhale 2 puffs into the lungs every 4 (four) hours as needed for wheezing or shortness of breath.     amLODipine (NORVASC) 10 MG tablet Take 1 tablet (10 mg total) by mouth daily. 30 tablet 0   atorvastatin (LIPITOR) 80 MG tablet Take 80 mg by mouth at bedtime.     carvedilol (COREG) 6.25 MG tablet Take 1 tablet (6.25 mg total) by mouth 2 (two) times daily with a meal. 60 tablet 0   cetirizine (ZYRTEC) 10 MG tablet Take 10 mg by mouth daily.     cholecalciferol (VITAMIN D3) 25 MCG (1000 UNIT) tablet Take 1,000 Units by mouth daily.     [START ON 10/29/2022] ciprofloxacin (CIPRO) 500 MG tablet Take 1 tablet (500 mg total) by mouth 2 (two) times daily. Per infectious diseases: start 10/29/2022 after completes zosyn IV On 10/28/2022. To take with metronidazole Indication: suppressive therapy for groin wounds 60 tablet 0   clopidogrel (PLAVIX) 75 MG tablet Take 1 tablet (75 mg total) by mouth daily. 30 tablet 6   docusate sodium (COLACE) 100 MG capsule Take 1 capsule (100 mg total) by mouth daily. 30 capsule 0   escitalopram (LEXAPRO) 10 MG tablet Take 1 tablet (10 mg total) by mouth daily. 30 tablet 0   fluticasone (FLONASE) 50 MCG/ACT nasal spray Place 1 spray into both nostrils daily as needed for allergies.     fluticasone-salmeterol (ADVAIR) 250-50 MCG/ACT AEPB Inhale 1 puff into the lungs daily.     heparin lock flush 100 UNIT/ML SOLN injection Inject 10 Units into the vein 3 (three) times daily. Use SASH method with IV ABT     hydrALAZINE (APRESOLINE) 50 MG tablet Take 1 tablet (50 mg total) by mouth 3 (three) times daily. 90 tablet 0   insulin aspart (NOVOLOG) 100 UNIT/ML FlexPen Before each meal 3 times a day, 140-199 - 2 units, 200-250 - 4 units, 251-299 - 8 units,  300-349 - 10 units,  350 or above 12 units. I 15 mL 0   insulin aspart (NOVOLOG) 100 UNIT/ML injection Inject 3 Units into the skin 3 (three) times daily with meals. Hold for NPO or consuming less than 50% of meals (Patient taking  differently: Inject 3 Units into the skin See admin instructions. Inject 3 units subcutaneously with meals. HOLD for NPO or consuming < 50% of meals.) 10 mL 11   insulin glargine (LANTUS) 100 UNIT/ML injection Inject 0.1 mLs (10 Units total) into the skin 2 (two) times daily. 10 mL 0   levETIRAcetam (KEPPRA) 500 MG tablet Take 1 tablet (500 mg total) by mouth 2 (two) times daily.     [START ON 10/29/2022] metroNIDAZOLE (FLAGYL) 500 MG tablet Take 1 tablet (500 mg total) by mouth 2 (two) times daily for 60 doses. Per infectious  diseases: start 10/29/2022 after completes zosyn IV On 10/28/2022. To take with ciprofloxacin Indication: suppressive therapy for groin wounds 60 tablet 0   mirtazapine (REMERON) 7.5 MG tablet Take 1 tablet (7.5 mg total) by mouth at bedtime. 30 tablet 0   Multiple Vitamins-Minerals (MULTIVITAMIN WITH MINERALS) tablet Take 1 tablet by mouth daily.     nystatin cream (MYCOSTATIN) Apply topically to rash on buttocks two times a day for 14 days.     pantoprazole (PROTONIX) 40 MG tablet Take 1 tablet (40 mg total) by mouth daily. 30 tablet 0   piperacillin-tazobactam (ZOSYN) IVPB Inject 13.5 g into the vein continuous for 6 days. Infuse piperacillin/tazobactam 13.5gm daily as continuous infusion Indication:  Polymicrobial ground wound s/p recent vascular surgery with grafts First Dose: Yes Last Day of Therapy:  10/28/2022 Labs - Once weekly:  CBC/D and BMP Method of administration: Elastomeric (Continuous infusion) Method of administration may be changed at the discretion of home infusion pharmacist based upon assessment of the patient and/or caregiver's ability to self-administer the medication ordered. 7 Units 0   polyethylene glycol (MIRALAX / GLYCOLAX) 17 g packet Take 17 g by mouth daily. Titrate as needed for constipation. (Patient taking differently: Take 17 g by mouth daily.) 14 each 0   senna-docusate (SENOKOT-S) 8.6-50 MG tablet Take 1 tablet by mouth daily.     sodium chloride  flush 0.9 % SOLN injection Inject 10 mLs into the vein 3 (three) times daily. Use SASH method with IV ABT     traZODone (DESYREL) 50 MG tablet Take 0.5 tablets (25 mg total) by mouth at bedtime as needed for sleep.       ED Course: Pt in Ed is Alert, awake, afebrile and oriented.  Vitals:   10/23/22 2230 10/23/22 2300 10/23/22 2330 10/24/22 0000  BP: 125/61 135/65 (!) 155/82 (!) 108/59  Pulse: 90 85 87 94  Resp: '14 15 14 15  '$ Temp:      TempSrc:      SpO2: 92% 96% 97% 94%  Weight:        No intake/output data recorded. SpO2: 94 % Blood work in ed shows: Leukocytosis of 10.9, anemia of 10.5. Hypokalemia of 3.4 glucose 156 normal creatinine, new abnormal LFTs with an AST of 72 and ALT of 165 and alk phos of 342, sed rate of 58, patient had a cholecystectomy we will get a right upper quadrant ultrasound for common bile duct eval. MRI of the back negative for concerns of discitis and infection. Results for orders placed or performed during the hospital encounter of 10/23/22 (from the past 24 hour(s))  CBC with Differential     Status: Abnormal   Collection Time: 10/23/22  8:43 PM  Result Value Ref Range   WBC 10.9 (H) 4.0 - 10.5 K/uL   RBC 3.68 (L) 3.87 - 5.11 MIL/uL   Hemoglobin 10.5 (L) 12.0 - 15.0 g/dL   HCT 33.8 (L) 36.0 - 46.0 %   MCV 91.8 80.0 - 100.0 fL   MCH 28.5 26.0 - 34.0 pg   MCHC 31.1 30.0 - 36.0 g/dL   RDW 16.4 (H) 11.5 - 15.5 %   Platelets 449 (H) 150 - 400 K/uL   nRBC 0.0 0.0 - 0.2 %   Neutrophils Relative % 80 %   Neutro Abs 8.8 (H) 1.7 - 7.7 K/uL   Lymphocytes Relative 9 %   Lymphs Abs 1.0 0.7 - 4.0 K/uL   Monocytes Relative 6 %   Monocytes Absolute 0.6 0.1 -  1.0 K/uL   Eosinophils Relative 3 %   Eosinophils Absolute 0.3 0.0 - 0.5 K/uL   Basophils Relative 1 %   Basophils Absolute 0.1 0.0 - 0.1 K/uL   Immature Granulocytes 1 %   Abs Immature Granulocytes 0.05 0.00 - 0.07 K/uL  Comprehensive metabolic panel     Status: Abnormal   Collection Time: 10/23/22   8:43 PM  Result Value Ref Range   Sodium 137 135 - 145 mmol/L   Potassium 3.4 (L) 3.5 - 5.1 mmol/L   Chloride 101 98 - 111 mmol/L   CO2 28 22 - 32 mmol/L   Glucose, Bld 156 (H) 70 - 99 mg/dL   BUN 12 8 - 23 mg/dL   Creatinine, Ser 0.60 0.44 - 1.00 mg/dL   Calcium 9.3 8.9 - 10.3 mg/dL   Total Protein 6.6 6.5 - 8.1 g/dL   Albumin 2.8 (L) 3.5 - 5.0 g/dL   AST 72 (H) 15 - 41 U/L   ALT 165 (H) 0 - 44 U/L   Alkaline Phosphatase 342 (H) 38 - 126 U/L   Total Bilirubin 0.5 0.3 - 1.2 mg/dL   GFR, Estimated >60 >60 mL/min   Anion gap 8 5 - 15  Sedimentation rate     Status: Abnormal   Collection Time: 10/23/22  8:43 PM  Result Value Ref Range   Sed Rate 58 (H) 0 - 30 mm/hr   Unresulted Labs (From admission, onward)     Start     Ordered   10/23/22 2036  C-reactive protein  Once,   URGENT        10/23/22 2035           Pt has received : Orders Placed This Encounter  Procedures   MR Lumbar Spine W Wo Contrast    Standing Status:   Standing    Number of Occurrences:   1    Order Specific Question:   If indicated for the ordered procedure, I authorize the administration of contrast media per Radiology protocol    Answer:   Yes    Order Specific Question:   What is the patient's sedation requirement?    Answer:   No Sedation    Order Specific Question:   Does the patient have a pacemaker or implanted devices?    Answer:   No    Order Specific Question:   Release to patient    Answer:   Immediate   US Abdomen Limited RUQ (LIVER/GB)    Standing Status:   Standing    Number of Occurrences:   1    Order Specific Question:   Symptom/Reason for Exam    Answer:   Abnormal LFTs (liver function tests) [id number]   CBC with Differential    Standing Status:   Standing    Number of Occurrences:   1   Comprehensive metabolic panel    Standing Status:   Standing    Number of Occurrences:   1   Sedimentation rate    Standing Status:   Standing    Number of Occurrences:   1   C-reactive  protein    Standing Status:   Standing    Number of Occurrences:   1   Diet heart healthy/carb modified Room service appropriate? Yes; Fluid consistency: Thin    Standing Status:   Standing    Number of Occurrences:   1    Order Specific Question:   Diet-HS Snack?    Answer:  Nothing    Order Specific Question:   Room service appropriate?    Answer:   Yes    Order Specific Question:   Fluid consistency:    Answer:   Thin   Apply Diabetes Mellitus Care Plan    Standing Status:   Standing    Number of Occurrences:   1   STAT CBG when hypoglycemia is suspected. If treated, recheck every 15 minutes after each treatment until CBG >/= 70 mg/dl    Standing Status:   Standing    Number of Occurrences:   1   Refer to Hypoglycemia Protocol Sidebar Report for treatment of CBG < 70 mg/dl    Standing Status:   Standing    Number of Occurrences:   1   Vital signs    Standing Status:   Standing    Number of Occurrences:   1   Notify physician (specify)    Standing Status:   Standing    Number of Occurrences:   20    Order Specific Question:   Notify Physician    Answer:   for pulse less than 55 or greater than 120    Order Specific Question:   Notify Physician    Answer:   for respiratory rate less than 12 or greater than 25    Order Specific Question:   Notify Physician    Answer:   for temperature greater than 100.5 F    Order Specific Question:   Notify Physician    Answer:   for urinary output less than 30 mL/hr for four hours    Order Specific Question:   Notify Physician    Answer:   for systolic BP less than 90 or greater than 0000000, diastolic BP less than 60 or greater than 100    Order Specific Question:   Notify Physician    Answer:   for new hypoxia w/ oxygen saturations < 88%   Progressive Mobility Protocol: No Restrictions    Standing Status:   Standing    Number of Occurrences:   1   Initiate Oral Care Protocol    Standing Status:   Standing    Number of Occurrences:   1    Initiate Carrier Fluid Protocol    Standing Status:   Standing    Number of Occurrences:   1   RN may order General Admission PRN Orders utilizing "General Admission PRN medications" (through manage orders) for the following patient needs: allergy symptoms (Claritin), cold sores (Carmex), cough (Robitussin DM), eye irritation (Liquifilm Tears), hemorrhoids (Tucks), indigestion (Maalox), minor skin irritation (Hydrocortisone Cream), muscle pain (Ben Gay), nose irritation (saline nasal spray) and sore throat (Chloraseptic spray).    Standing Status:   Standing    Number of Occurrences:   [phone]   SCDs    Standing Status:   Standing    Number of Occurrences:   1    Order Specific Question:   Laterality    Answer:   Bilateral   Cardiac Monitoring - Continuous Indefinite    Standing Status:   Standing    Number of Occurrences:   1    Order Specific Question:   Indications for use:    Answer:   Other    Order Specific Question:   Other indications for use:    Answer:   CAD.   Maintain IV access    Standing Status:   Standing    Number of Occurrences:   1   Daily  weights    Standing Status:   Standing    Number of Occurrences:   1   Intake and Output    Standing Status:   Standing    Number of Occurrences:   1   Neurovascular checks    Standing Status:   Standing    Number of Occurrences:   1   Full code    Standing Status:   Standing    Number of Occurrences:   1    Order Specific Question:   By:    Answer:   Other   Consult to hospitalist    Standing Status:   Standing    Number of Occurrences:   1    Order Specific Question:   Place call to:    Answer:   Hospitalist    Order Specific Question:   Reason for Consult    Answer:   Admit   Oxygen therapy Mode or (Route): Nasal cannula; Liters Per Minute: 2; Keep 02 saturation: greater than 92 %    Standing Status:   Standing    Number of Occurrences:   1    Order Specific Question:   Mode or (Route)    Answer:   Nasal cannula     Order Specific Question:   Liters Per Minute    Answer:   2    Order Specific Question:   Keep 02 saturation    Answer:   greater than 92 %   Pulse oximetry check with vital signs    Standing Status:   Standing    Number of Occurrences:   1   Insert peripheral IV    Standing Status:   Standing    Number of Occurrences:   1   Admit to Inpatient (patient's expected length of stay will be greater than 2 midnights or inpatient only procedure)    Standing Status:   Standing    Number of Occurrences:   1    Order Specific Question:   Hospital Area    Answer:   [facility] [[id number]]    Order Specific Question:   Level of Care    Answer:   Telemetry Cardiac [103]    Order Specific Question:   Covid Evaluation    Answer:   Asymptomatic - no recent exposure (last 10 days) testing not required    Order Specific Question:   Diagnosis    Answer:   Fall [id number]    Order Specific Question:   Admitting Physician    Answer:   Cherylann Ratel    Order Specific Question:   Attending Physician    Answer:   Cherylann Ratel    Order Specific Question:   Bed request comments    Answer:   INGUINAL WOUNDS / DM / SEIZURE    Order Specific Question:   Certification:    Answer:   I certify this patient will need inpatient services for at least 2 midnights    Order Specific Question:   Estimated Length of Stay    Answer:   5   Aspiration precautions    Standing Status:   Standing    Number of Occurrences:   1   Fall precautions    Standing Status:   Standing    Number of Occurrences:   1    Meds ordered this encounter  Medications   LORazepam (ATIVAN) tablet 1 mg   gadobutrol (GADAVIST) 1 MMOL/ML injection 5 mL  sodium chloride 0.9 % bolus 500 mL   0.9 %  sodium chloride infusion   amLODipine (NORVASC) tablet 10 mg   atorvastatin (LIPITOR) tablet 80 mg   carvedilol (COREG) tablet 6.25 mg   escitalopram (LEXAPRO) tablet 10 mg   fluticasone furoate-vilanterol  (BREO ELLIPTA) 200-25 MCG/ACT 1 puff   hydrALAZINE (APRESOLINE) tablet 50 mg   levETIRAcetam (KEPPRA) tablet 500 mg   multivitamin with minerals tablet 1 tablet   insulin detemir (LEVEMIR) injection 10 Units   insulin aspart (novoLOG) injection 0-20 Units    Order Specific Question:   Correction coverage:    Answer:   Resistant (obese, steroids)    Order Specific Question:   CBG < 70:    Answer:   Implement Hypoglycemia Standing Orders and refer to Hypoglycemia Standing Orders sidebar report    Order Specific Question:   CBG 70 - 120:    Answer:   0 units    Order Specific Question:   CBG 121 - 150:    Answer:   3 units    Order Specific Question:   CBG 151 - 200:    Answer:   4 units    Order Specific Question:   CBG 201 - 250:    Answer:   7 units    Order Specific Question:   CBG 251 - 300:    Answer:   11 units    Order Specific Question:   CBG 301 - 350:    Answer:   15 units    Order Specific Question:   CBG 351 - 400:    Answer:   20 units    Order Specific Question:   CBG > 400    Answer:   call MD and obtain STAT lab verification   sodium chloride flush (NS) 0.9 % injection 3 mL   lactated ringers infusion   nicotine (NICODERM CQ - dosed in mg/24 hours) patch 14 mg   hydrALAZINE (APRESOLINE) injection 5 mg   piperacillin-tazobactam (ZOSYN) IVPB 3.375 g    Order Specific Question:   Antibiotic Indication:    Answer:   Cellulitis     Admission Imaging : MR Lumbar Spine W Wo Contrast Result Date: 10/23/2022 CLINICAL DATA:  Initial evaluation for low back pain, fall, infection suspected. EXAM: MRI LUMBAR SPINE WITHOUT AND WITH CONTRAST TECHNIQUE: Multiplanar and multiecho pulse sequences of the lumbar spine were obtained without and with intravenous contrast. CONTRAST:  65m GADAVIST GADOBUTROL 1 MMOL/ML IV SOLN COMPARISON:  Radiograph from 10/18/2022. FINDINGS: Segmentation: Standard. Lowest well-formed disc space labeled the L5-S1 level. Alignment: Mild levoscoliosis. Trace  3 mm facet mediated anterolisthesis of L4 on L5. Vertebrae: Mild chronic compression deformities involving the superior endplates of [id number] [id number]. Vertebral body height otherwise maintained with no acute or recent fracture. Bone marrow signal intensity within normal limits. No worrisome osseous lesions. Prominent endplate changes with marrow edema and enhancement about the L4-5 and L5-S1 interspaces, favored to be degenerative. No other evidence for acute infection within the lumbar spine. Conus medullaris and cauda equina: Conus extends to the L1 level. Conus and cauda equina appear normal. Paraspinal and other soft tissues: Paraspinous soft tissues demonstrate no acute finding. Colonic diverticulosis noted. Small volume free fluid noted within the presacral space, nonspecific. Probable small uterine fibroid noted as well. Disc levels: T10-11: Seen only on sagittal projection. Minimal disc bulge with trace bony retropulsion related to the T11 compression fracture. No significant spinal stenosis. T11-12: Disc desiccation with minimal disc  bulge.  No stenosis. T12-L1: Unremarkable. L1-2:  Unremarkable. L2-3:  Unremarkable. L3-4: Mild degenerative intervertebral disc space narrowing with diffuse disc bulge and disc desiccation. Mild left facet hypertrophy. No spinal stenosis. Foramina remain patent. L4-5: Advanced degenerative intervertebral disc space narrowing with diffuse disc bulge and disc desiccation. Associated reactive endplate change with marginal endplate osteophytic spurring. Superimposed left subarticular disc protrusion with slight inferior migration (series 8, image 27). Moderate right worse than left facet hypertrophy. Resultant moderate canal with bilateral subarticular stenosis, worse on the left. Moderate right with mild to moderate left L4 foraminal narrowing. L5-S1: Advanced degenerative intervertebral disc space narrowing with disc desiccation and diffuse disc bulge. Associated reactive  endplate spurring. Mild left greater than right facet hypertrophy. Mild epidural lipomatosis. No significant spinal stenosis. Moderate left L5 foraminal narrowing. Right neural foramen remains patent. IMPRESSION:  1. No MRI evidence for acute infection within the lumbar spine.  2. 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  3. Degenerative disc bulge with facet hypertrophy at L5-S1 with resultant moderate left L5 foraminal stenosis.  4. Prominent endplate changes with marrow edema and enhancement about the L4-5 and L5-S1 interspaces, favored to be degenerative in nature. Finding could serve as a source for lower back pain.  5. Mild chronic compression deformities involving the superior endplates of 624THL and 624THL. 6. Small volume free fluid within the presacral space, nonspecific.  7. Colonic diverticulosis.  Electronically Signed   By: Jeannine Boga M.D.   On: 10/23/2022 21:58   Physical Examination: Vitals:   10/23/22 2230 10/23/22 2300 10/23/22 2330 10/24/22 0000  BP: 125/61 135/65 (!) 155/82 (!) 108/59  Pulse: 90 85 87 94  Temp:      Resp: '14 15 14 15  '$ Weight:      SpO2: 92% 96% 97% 94%  TempSrc:      BMI (Calculated):       Physical Exam Vitals and nursing note reviewed.  Constitutional:      General: She is sleeping. She is not in acute distress.    Appearance: She is not ill-appearing, toxic-appearing or diaphoretic.  HENT:     Head: Normocephalic and atraumatic.     Right Ear: Hearing and external ear normal.     Left Ear: Hearing and external ear normal.     Nose: Nose normal. No nasal deformity.     Mouth/Throat:     Lips: Pink.     Tongue: No lesions.     Pharynx: Oropharynx is clear.  Eyes:     Extraocular Movements: Extraocular movements intact.  Cardiovascular:     Rate and Rhythm: Normal rate and regular rhythm.     Pulses: Normal pulses.      Heart sounds: Normal heart sounds.  Pulmonary:     Effort: Pulmonary effort is normal.     Breath sounds: Normal breath sounds.  Abdominal:     General: Bowel sounds are normal. There is no distension.     Palpations: Abdomen is soft. There is no mass.     Tenderness: There is no abdominal tenderness. There is no guarding.     Hernia: No hernia is present.  Musculoskeletal:     Right lower leg: Edema present.     Left lower leg: Edema present.  Skin:    General: Skin is warm.  Neurological:     General: No focal deficit present.     Mental Status:  She is oriented to person, place, and time.     Cranial Nerves: Cranial nerves 2-12 are intact.     Motor: Motor function is intact.  Psychiatric:        Attention and Perception: Attention normal.        Mood and Affect: Mood normal.        Speech: Speech normal.        Behavior: Behavior normal. Behavior is cooperative.        Cognition and Memory: Cognition normal.       Assessment and Plan: * Fall Pt is deconditioned and needs assistance is not safe to transfer or manage herself at home.  PT eval and SNF.   Hypertension, benign Vitals:   10/23/22 2000 10/23/22 2030 10/23/22 2100  BP: 139/69 (!) 158/65 (!) 146/63  Continue amlodipine Coreg and hydralazine.   Diabetes mellitus ([place]) Poorly controlled diabetes mellitus type 2 most recent A1c of 12.92 months ago.  Currently we will manage patient on a consistent carb diet and continue home regimen in addition to sliding scale.  Seizure ([place]) Stop Tramadol. Avoid cephalosporins. Cont keppra.  Seizure precautions.   Open wound of inguinal region with complication Bilateral inguinal wound. Will continue patient on Zosyn.   Chronic diastolic CHF (congestive heart failure) (HCC) Stable we will continue patient on Coreg atorvastatin hydralazine and amlodipine.  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  Per Dr. Leonie Man : Dr. Leonie Man, neurology on 12/19.  From a stroke perspective, patient only needs a single antiplatelet agent.  If vascular surgery wishes to continue Plavix then he recommends discontinuing aspirin.  Communicated with Dr. Donzetta Matters, [place] to continue Plavix. ASA DC'ed.   PAD (peripheral artery disease) (HCC) Statin therapy with atorvastatin 80 mg. Single agent antiplatelet. Dr. Leonie Man, neurology on 12/19.  From a stroke perspective, patient only needs a single antiplatelet agent.  If vascular surgery wishes to continue Plavix then he recommends discontinuing aspirin.  Communicated with Dr. Donzetta Matters, [place] to continue Plavix. ASA DC'ed.    AKI (acute kidney injury) [facility]) Lab Results  Component Value Date   CREATININE 0.60 10/23/2022   CREATININE 0.65 10/18/2022   CREATININE 0.80 10/17/2022  Patient has a history of AKI and will monitor with routine and regular blood testing.    Tobacco use disorder Nicotine patch and counseling as tobacco cessation is critical to further prevent deterioration and promote healing.  COPD (chronic obstructive pulmonary disease) (HCC) Stable.  O2 sats are 94% on room air. SpO2: 94 % Continue patient on Flonase, Advair, as needed Ventolin.  Coronary artery disease Continue patient on Coreg, atorvastatin, hydralazine.     DVT prophylaxis:  SCD's.   Code Status:  Full code.   Family Communication:  Espindola,Shorty (Spouse) [phone].  Disposition Plan:  SNF.   Consults called:  None .  Admission status: Inpatient.   Unit/ Expected LOS: Med tele / 3-4 days.    Para Skeans MD [facility]  6 PM- 2 AM. Please contact me via secure Chat 6 PM-2 AM. [phone]( Pager ) To contact the [facility] Attending or Consulting provider [place] or covering provider during after hours [place], for this patient.   Check the care team in [facility] and  look for a) attending/consulting [facility] provider listed and b) the [facility] team listed Log  into [email or web address] and use [place]'s universal password to access. If you do not have the password, please contact the hospital operator. Locate the [facility] provider you are looking for under [facility] and page to a number that you can be directly reached. If you still have difficulty reaching the provider, please page the [facility] (Director on Call) for the Hospitalists listed on amion for assistance. [email or web address] 10/24/2022, 12:10 AM

## 2022-10-23 NOTE — ED Provider Notes (Signed)
[facility] Provider Note    Event Date/Time   First MD Initiated Contact with Patient 10/23/22 1957     (approximate)   History   Fall   HPI  Heather [name] is a 72 y.o. female  with complex PMHx including HFpEF, CAD, depression, schizophrenia, T2DM, here with back pain. Pt was just discharged after hospitalization for weakness 2/2 infection of her R groin from vascular procedure, for which she is on IV zosyn. She states she was getting out of her chair today when her back spasms and caused her to fall. She has had worsening lower back pain x a month or so and has been having increasingly severe spasms. She had a plain film when she slipped out of her chair last time btu has not had any additional imaging. No le weakness, numbness. She has had generalized fatigue, weakness throughout the day today as well. No other complaints. Her groin site is healing and she denies any new or increased pain here.      Physical Exam   Triage Vital Signs: ED Triage Vitals  Enc Vitals Group     BP 10/23/22 2000 139/69     Pulse Rate 10/23/22 2000 83     Resp 10/23/22 2000 16     Temp 10/23/22 2000 98.2 F (36.8 C)     Temp Source 10/23/22 2000 Oral     SpO2 10/23/22 2000 100 %     Weight 10/23/22 2001 111 lb (50.3 kg)     Height --      Head Circumference --      Peak Flow --      Pain Score --      Pain Loc --      Pain Edu? --      Excl. in [place]? --     Most recent vital signs: Vitals:   10/24/22 0000 10/24/22 0050  BP: (!) 150/63 (!) 145/74  Pulse: 94 74  Resp: 15 16  Temp:  98.7 F (37.1 C)  SpO2: 94% 99%     General: Awake, no distress.  CV:  Good peripheral perfusion. RRR.  Resp:  Normal work of breathing. Lungs clear. Abd:  No distention. No tenderness. Other:  Marked TTP across lower lumbar spine. R inguinal site with open wound and mild warmth but no drainage/purulence.   ED Results / Procedures / Treatments   Labs (all labs ordered are  listed, but only abnormal results are displayed) Labs Reviewed  CBC WITH DIFFERENTIAL/PLATELET - Abnormal; Notable for the following components:      Result Value   WBC 10.9 (*)    RBC 3.68 (*)    Hemoglobin 10.5 (*)    HCT 33.8 (*)    RDW 16.4 (*)    Platelets 449 (*)    Neutro Abs 8.8 (*)    All other components within normal limits  COMPREHENSIVE METABOLIC PANEL - Abnormal; Notable for the following components:   Potassium 3.4 (*)    Glucose, Bld 156 (*)    Albumin 2.8 (*)    AST 72 (*)    ALT 165 (*)    Alkaline Phosphatase 342 (*)    All other components within normal limits  SEDIMENTATION RATE - Abnormal; Notable for the following components:   Sed Rate 58 (*)    All other components within normal limits  GLUCOSE, CAPILLARY - Abnormal; Notable for the following components:   Glucose-Capillary 131 (*)    All other components  within normal limits  C-REACTIVE PROTEIN     EKG    RADIOLOGY MR Spine Lumbar WWO: No acute osteo or diskitis, degen disk bulge and prominent EP changes with marrow edema lower lumabr spine   I also independently reviewed and agree with radiologist interpretations.   PROCEDURES:  Critical Care performed: No   MEDICATIONS ORDERED IN ED: Medications  amLODipine (NORVASC) tablet 10 mg (has no administration in time range)  atorvastatin (LIPITOR) tablet 80 mg (80 mg Oral Given 10/24/22 0107)  carvedilol (COREG) tablet 6.25 mg (has no administration in time range)  escitalopram (LEXAPRO) tablet 10 mg (has no administration in time range)  fluticasone furoate-vilanterol (BREO ELLIPTA) 200-25 MCG/ACT 1 puff (has no administration in time range)  hydrALAZINE (APRESOLINE) tablet 50 mg (50 mg Oral Given 10/24/22 0107)  levETIRAcetam (KEPPRA) tablet 500 mg (has no administration in time range)  multivitamin with minerals tablet 1 tablet (has no administration in time range)  insulin detemir (LEVEMIR) injection 10 Units (has no administration in time  range)  insulin aspart (novoLOG) injection 0-20 Units (3 Units Subcutaneous Given 10/24/22 0105)  sodium chloride flush (NS) 0.9 % injection 3 mL (3 mLs Intravenous Given 10/24/22 0106)  lactated ringers infusion (has no administration in time range)  nicotine (NICODERM CQ - dosed in mg/24 hours) patch 14 mg (14 mg Transdermal Patch Applied 10/24/22 0105)  hydrALAZINE (APRESOLINE) injection 5 mg (has no administration in time range)  piperacillin-tazobactam (ZOSYN) IVPB 3.375 g (has no administration in time range)  LORazepam (ATIVAN) tablet 1 mg (1 mg Oral Given 10/23/22 2041)  gadobutrol (GADAVIST) 1 MMOL/ML injection 5 mL (5 mLs Intravenous Contrast Given 10/23/22 2137)  sodium chloride 0.9 % bolus 500 mL (500 mLs Intravenous New Bag/Given 10/23/22 2236)  0.9 %  sodium chloride infusion ( Intravenous New Bag/Given 10/23/22 2235)     IMPRESSION / MDM / ASSESSMENT AND PLAN / ED COURSE  I reviewed the triage vital signs and the nursing notes.                              Differential diagnosis includes, but is not limited to, ongoing weakness from deconditioning and groin infection, osteo/diskitis in setting of IV ABX use and recent vascular procedure with infection, dehydration, polypharmacy  Patient's presentation is most consistent with acute presentation with potential threat to life or bodily function.  The patient is on the cardiac monitor to evaluate for evidence of arrhythmia and/or significant heart rate changes  72 yo F with PMHx as above here with fall, weakness. From the fall perspective, no major trauma identified. Pt did just return home and has had poor PO intake, difficulty taking care of herself at home and appears dehydrated here. WBC increased from last inpatient lab. No fevers or signs of sepsis. Of note, pt has new transaminitis and elevated AlkP - this could be related to her Zosyn use. However, given this and her back pain in setting of PICC line and recent vascular infection,  MRI obtained and shows no evidence of osteo/diskitis. Pt may need to transition off Zosyn given new labs. Will admit for obs and discussion with ID.   FINAL CLINICAL IMPRESSION(S) / ED DIAGNOSES   Final diagnoses:  Transaminitis  Generalized weakness  Fall, initial encounter     Rx / DC Orders   ED Discharge Orders     None        Note:  This document was  prepared using Systems analyst and may include unintentional dictation errors.   Duffy Bruce, MD 10/24/22 [id number]

## 2022-10-23 NOTE — Assessment & Plan Note (Signed)
Vitals:   10/23/22 2000 10/23/22 2030 10/23/22 2100  BP: 139/69 (!) 158/65 (!) 146/63  Continue amlodipine Coreg and hydralazine.

## 2022-10-23 NOTE — Assessment & Plan Note (Addendum)
Lab Results  Component Value Date   CREATININE 0.60 10/23/2022   CREATININE 0.65 10/18/2022   CREATININE 0.80 10/17/2022  Patient has a history of AKI and will monitor with routine and regular blood testing.

## 2022-10-23 NOTE — Assessment & Plan Note (Signed)
Stop Tramadol. Avoid cephalosporins. Cont keppra.  Seizure precautions.

## 2022-10-23 NOTE — Assessment & Plan Note (Signed)
Nicotine patch and counseling as tobacco cessation is critical to further prevent deterioration and promote healing.

## 2022-10-23 NOTE — ED Triage Notes (Signed)
Patient was transferring from a recliner to her wheelchair when she accidentally slid off falling to the floor. Patient states she fell on her bottom and her lower back jerked and started to spasm. Patient denies hitting her head or any LOC. She is on blood thinners.

## 2022-10-23 NOTE — Assessment & Plan Note (Signed)
Poorly controlled diabetes mellitus type 2 most recent A1c of 12.92 months ago.  Currently we will manage patient on a consistent carb diet and continue home regimen in addition to sliding scale.

## 2022-10-23 NOTE — ED Notes (Signed)
Pt was rolled side to side, BM cleaned up, excoriation to bilateral buttocks noted.

## 2022-10-23 NOTE — Assessment & Plan Note (Addendum)
Statin therapy with atorvastatin 80 mg. Single agent antiplatelet. Dr. Leonie Man, neurology on 12/19.  From a stroke perspective, patient only needs a single antiplatelet agent.  If vascular surgery wishes to continue Plavix then he recommends discontinuing aspirin.  Communicated with Dr. Donzetta Matters, [place] to continue Plavix. ASA DC'ed.

## 2022-10-23 NOTE — Assessment & Plan Note (Signed)
Bilateral inguinal wound. Will continue patient on Zosyn.

## 2022-10-23 NOTE — Assessment & Plan Note (Signed)
Stable.  O2 sats are 94% on room air. SpO2: 94 % Continue patient on Flonase, Advair, as needed Ventolin.

## 2022-10-23 NOTE — Assessment & Plan Note (Signed)
Continue patient on Coreg, atorvastatin, hydralazine.

## 2022-10-24 ENCOUNTER — Inpatient Hospital Stay: Payer: Medicare HMO

## 2022-10-24 ENCOUNTER — Telehealth: Payer: Self-pay | Admitting: Vascular Surgery

## 2022-10-24 DIAGNOSIS — Z89612 Acquired absence of left leg above knee: Secondary | ICD-10-CM

## 2022-10-24 DIAGNOSIS — E1159 Type 2 diabetes mellitus with other circulatory complications: Secondary | ICD-10-CM | POA: Diagnosis not present

## 2022-10-24 DIAGNOSIS — Z8679 Personal history of other diseases of the circulatory system: Secondary | ICD-10-CM

## 2022-10-24 DIAGNOSIS — M25561 Pain in right knee: Secondary | ICD-10-CM

## 2022-10-24 DIAGNOSIS — I10 Essential (primary) hypertension: Secondary | ICD-10-CM | POA: Diagnosis not present

## 2022-10-24 DIAGNOSIS — S31109D Unspecified open wound of abdominal wall, unspecified quadrant without penetration into peritoneal cavity, subsequent encounter: Secondary | ICD-10-CM

## 2022-10-24 DIAGNOSIS — J439 Emphysema, unspecified: Secondary | ICD-10-CM

## 2022-10-24 DIAGNOSIS — Z87898 Personal history of other specified conditions: Secondary | ICD-10-CM

## 2022-10-24 DIAGNOSIS — E876 Hypokalemia: Secondary | ICD-10-CM

## 2022-10-24 DIAGNOSIS — R531 Weakness: Secondary | ICD-10-CM

## 2022-10-24 DIAGNOSIS — I5032 Chronic diastolic (congestive) heart failure: Secondary | ICD-10-CM

## 2022-10-24 DIAGNOSIS — I25118 Atherosclerotic heart disease of native coronary artery with other forms of angina pectoris: Secondary | ICD-10-CM

## 2022-10-24 DIAGNOSIS — F17200 Nicotine dependence, unspecified, uncomplicated: Secondary | ICD-10-CM

## 2022-10-24 DIAGNOSIS — Y92009 Unspecified place in unspecified non-institutional (private) residence as the place of occurrence of the external cause: Secondary | ICD-10-CM

## 2022-10-24 DIAGNOSIS — W19XXXA Unspecified fall, initial encounter: Secondary | ICD-10-CM | POA: Diagnosis not present

## 2022-10-24 DIAGNOSIS — Z794 Long term (current) use of insulin: Secondary | ICD-10-CM

## 2022-10-24 LAB — GLUCOSE, CAPILLARY
Glucose-Capillary: 108 mg/dL — ABNORMAL HIGH (ref 70–99)
Glucose-Capillary: 125 mg/dL — ABNORMAL HIGH (ref 70–99)
Glucose-Capillary: 131 mg/dL — ABNORMAL HIGH (ref 70–99)
Glucose-Capillary: 234 mg/dL — ABNORMAL HIGH (ref 70–99)
Glucose-Capillary: 67 mg/dL — ABNORMAL LOW (ref 70–99)
Glucose-Capillary: 77 mg/dL (ref 70–99)
Glucose-Capillary: 83 mg/dL (ref 70–99)

## 2022-10-24 LAB — RENAL FUNCTION PANEL
Albumin: 3 g/dL — ABNORMAL LOW (ref 3.5–5.0)
Anion gap: 11 (ref 5–15)
BUN: 9 mg/dL (ref 8–23)
CO2: 26 mmol/L (ref 22–32)
Calcium: 9.3 mg/dL (ref 8.9–10.3)
Chloride: 103 mmol/L (ref 98–111)
Creatinine, Ser: 0.54 mg/dL (ref 0.44–1.00)
GFR, Estimated: >60 mL/min (ref >60)
Glucose, Bld: 82 mg/dL (ref 70–99)
Phosphorus: 3.6 mg/dL (ref 2.5–4.6)
Potassium: 3.4 mmol/L — ABNORMAL LOW (ref 3.5–5.1)
Sodium: 140 mmol/L (ref 135–145)

## 2022-10-24 LAB — CBC
HCT: 36.2 % (ref 36.0–46.0)
Hemoglobin: 11.5 g/dL — ABNORMAL LOW (ref 12.0–15.0)
MCH: 28.8 pg (ref 26.0–34.0)
MCHC: 31.8 g/dL (ref 30.0–36.0)
MCV: 90.5 fL (ref 80.0–100.0)
Platelets: 487 10*3/uL — ABNORMAL HIGH (ref 150–400)
RBC: 4 MIL/uL (ref 3.87–5.11)
RDW: 16.5 % — ABNORMAL HIGH (ref 11.5–15.5)
WBC: 8 10*3/uL (ref 4.0–10.5)
nRBC: 0 % (ref 0.0–0.2)

## 2022-10-24 LAB — MAGNESIUM: Magnesium: 1.7 mg/dL (ref 1.7–2.4)

## 2022-10-24 LAB — URIC ACID: Uric Acid, Serum: 2.5 mg/dL (ref 2.5–7.1)

## 2022-10-24 LAB — C-REACTIVE PROTEIN: CRP: 3 mg/dL — ABNORMAL HIGH (ref <1.0)

## 2022-10-24 LAB — CK: Total CK: 28 U/L — ABNORMAL LOW (ref 38–234)

## 2022-10-24 MED ORDER — OXYCODONE HCL 5 MG PO TABS
5.0000 mg | ORAL_TABLET | Freq: Four times a day (QID) | ORAL | Status: DC | PRN
  Administered 2022-10-24 – 2022-10-31 (×13): 5 mg via ORAL
  Filled 2022-10-24 (×14): qty 1

## 2022-10-24 MED ORDER — GABAPENTIN 400 MG PO CAPS: 400.0000 mg | ORAL_CAPSULE | Freq: Every day | ORAL | Status: DC

## 2022-10-24 MED ORDER — SENNOSIDES-DOCUSATE SODIUM 8.6-50 MG PO TABS: 2.0000 | ORAL_TABLET | Freq: Two times a day (BID) | ORAL | Status: DC | PRN

## 2022-10-24 MED ORDER — CHLORHEXIDINE GLUCONATE CLOTH 2 % EX PADS
6.0000 | MEDICATED_PAD | Freq: Every day | CUTANEOUS | Status: DC
  Administered 2022-10-24 – 2022-10-29 (×6): 6 via TOPICAL

## 2022-10-24 MED ORDER — GABAPENTIN 100 MG PO CAPS
200.0000 mg | ORAL_CAPSULE | Freq: Two times a day (BID) | ORAL | Status: DC
  Administered 2022-10-25 – 2022-10-31 (×13): 200 mg via ORAL
  Filled 2022-10-24 (×13): qty 2

## 2022-10-24 MED ORDER — GABAPENTIN 100 MG PO CAPS: 200.0000 mg | ORAL_CAPSULE | Freq: Two times a day (BID) | ORAL | Status: DC

## 2022-10-24 MED ORDER — POLYETHYLENE GLYCOL 3350 17 G PO PACK: 17.0000 g | PACK | Freq: Two times a day (BID) | ORAL | Status: DC | PRN

## 2022-10-24 MED ORDER — GABAPENTIN 400 MG PO CAPS
400.0000 mg | ORAL_CAPSULE | Freq: Every day | ORAL | Status: DC
  Administered 2022-10-24 – 2022-10-30 (×7): 400 mg via ORAL
  Filled 2022-10-24 (×7): qty 1

## 2022-10-24 MED ORDER — ENOXAPARIN SODIUM 40 MG/0.4ML IJ SOSY
40.0000 mg | PREFILLED_SYRINGE | INTRAMUSCULAR | Status: DC
  Administered 2022-10-24 – 2022-10-30 (×7): 40 mg via SUBCUTANEOUS
  Filled 2022-10-24 (×7): qty 0.4

## 2022-10-24 MED ORDER — ACETAMINOPHEN 500 MG PO TABS
1000.0000 mg | ORAL_TABLET | Freq: Three times a day (TID) | ORAL | Status: DC
  Administered 2022-10-24 – 2022-10-31 (×21): 1000 mg via ORAL
  Filled 2022-10-24 (×21): qty 2

## 2022-10-24 MED ORDER — DICLOFENAC SODIUM 1 % EX GEL
2.0000 g | Freq: Four times a day (QID) | CUTANEOUS | Status: DC
  Administered 2022-10-24 – 2022-10-31 (×23): 2 g via TOPICAL
  Filled 2022-10-24 (×2): qty 100

## 2022-10-24 MED ORDER — INSULIN ASPART 100 UNIT/ML IJ SOLN
0.0000 [IU] | Freq: Three times a day (TID) | INTRAMUSCULAR | Status: DC
  Administered 2022-10-25: 5 [IU] via SUBCUTANEOUS
  Administered 2022-10-25: 2 [IU] via SUBCUTANEOUS
  Administered 2022-10-26 (×2): 3 [IU] via SUBCUTANEOUS
  Administered 2022-10-27 (×2): 2 [IU] via SUBCUTANEOUS
  Administered 2022-10-27 – 2022-10-28 (×2): 5 [IU] via SUBCUTANEOUS
  Administered 2022-10-28: 3 [IU] via SUBCUTANEOUS
  Administered 2022-10-28: 8 [IU] via SUBCUTANEOUS
  Administered 2022-10-29: 2 [IU] via SUBCUTANEOUS
  Administered 2022-10-29: 3 [IU] via SUBCUTANEOUS
  Administered 2022-10-29: 8 [IU] via SUBCUTANEOUS
  Administered 2022-10-30: 5 [IU] via SUBCUTANEOUS
  Administered 2022-10-30: 3 [IU] via SUBCUTANEOUS
  Administered 2022-10-30: 8 [IU] via SUBCUTANEOUS
  Administered 2022-10-31: 3 [IU] via SUBCUTANEOUS
  Filled 2022-10-24 (×17): qty 1

## 2022-10-24 NOTE — Inpatient Diabetes Management (Signed)
Inpatient Diabetes Program Recommendations  AACE/ADA: New Consensus Statement on Inpatient Glycemic Control (2015)  Target Ranges:  Prepandial:   less than 140 mg/dL      Peak postprandial:   less than 180 mg/dL (1-2 hours)      Critically ill patients:  140 - 180 mg/dL   Lab Results  Component Value Date   GLUCAP 83 10/24/2022   HGBA1C 12.9 (H) 08/05/2022    Review of Glycemic Control  Latest Reference Range & Units 10/24/22 00:53 10/24/22 05:10 10/24/22 06:07 10/24/22 08:30  Glucose-Capillary 70 - 99 mg/dL 131 (H) 67 (L) 77 83  (H): Data is abnormally high (L): Data is abnormally low Diabetes history: Type 2 DM Outpatient Diabetes medications: Novolog 0-15 units TID, Novolog 3 units TID, Lantus 10 units BID Current orders for Inpatient glycemic control: Levemir 10 units BID, Novolog 0-20 units Q4H  Inpatient Diabetes Program Recommendations:    Noted mild hypoglycemia this AM of 67 mg/dL. Consider reducing correction to Novolog 0-9 units Q4H.   Thanks, Bronson Curb, MSN, RNC-OB Diabetes Coordinator [phone] (8a-5p)

## 2022-10-24 NOTE — Progress Notes (Signed)
PHARMACY NOTE:  ANTIMICROBIAL RENAL DOSAGE ADJUSTMENT  Current antimicrobial regimen includes a mismatch between antimicrobial dosage and estimated renal function.  As per policy approved by the Pharmacy & Therapeutics and Medical Executive Committees, the antimicrobial dosage will be adjusted accordingly.  Current antimicrobial dosage:  Zosyn 3.375 gm IV Q6H over 30 min   Indication:  Open wound   Renal Function:  Estimated Creatinine Clearance: 47.7 mL/min (by C-G formula based on SCr of 0.6 mg/dL). '[]'$      On intermittent HD, scheduled: '[]'$      On CRRT    Antimicrobial dosage has been changed to:  Zosyn 3.375 gm IV Q8H EI   Additional comments:   Thank you for allowing pharmacy to be a part of this patient's care.  Kerigan Narvaez D, [facility] 10/24/2022 12:37 AM

## 2022-10-24 NOTE — Evaluation (Signed)
Physical Therapy Evaluation Patient Details Name: Heather [name] MRN: [id number] DOB: 1951-05-27 Today's Date: 10/24/2022  History of Present Illness  Heather [name] is an 72 y.o. female seen in the emergency room today after a fall and has complaints of back pain.  She was transferring from the recliner to wheelchair when she accidentally slid off to the floor and fell on her bottom and her lower back started to spasm.   Clinical Impression  Pt admitted with above diagnosis. Pt received upright in bed finishing her lunch agreeable to PT/OT co-eval. Pt known to author and rehab team as she was admitted a day or two prior. Pt reports she d/c'd home instead of STR relying on 2 person assist to transfer at home not using her sliding board that was provided last admission. Pt reports falling out of recliner trying to transfer back to her w/c due to sliding forward onto the floor and was unable to get up. She has needed assist with all ADL's/IADL's and even with 2 person help at home she was not successful in managing at home.    To date pt reports having BM in bed relying on maxA+1 to roll onto her R side and dependent for toileting. Otherwise pt is maxA+2 for bed mobility with max multimodal cuing for positioning and UE/LE set up. Reliant on frequent maxA+1 in sitting despite R foot and BUE support and max VC's for anterior trunk lean as pt has heavy posterior bias. MaxA+2 for R trunk lean and placement of slide board and for scoot transfer to recliner going to the R with max VC's for hand placement. Pt placed on pillow due to irritated buttocks and skin breakdown to assist in pressure relief. Per NT RN is aware. Pt with all needs in reach. Heavily encouraged pt on need for additional therapy prior to d/c home to reduce risk for significant injury and frequent readmission rate. Pt verbalized understanding and in agreement. Pt currently with functional limitations due to the deficits listed below (see PT Problem  List). Pt will benefit from skilled PT to increase their independence and safety with mobility to allow discharge to the venue listed below.      Recommendations for follow up therapy are one component of a multi-disciplinary discharge planning process, led by the attending physician.  Recommendations may be updated based on patient status, additional functional criteria and insurance authorization.  Follow Up Recommendations Skilled nursing-short term rehab (<3 hours/day) Can patient physically be transported by private vehicle: No    Assistance Recommended at Discharge Frequent or constant Supervision/Assistance  Patient can return home with the following  Two people to help with walking and/or transfers;Two people to help with bathing/dressing/bathroom;Help with stairs or ramp for entrance;Assist for transportation;Assistance with cooking/housework    Equipment Recommendations Other (comment) (if pt d/c's home she will require a hoyer lift and family training prior to d/c to prevent falls/injury leading re-admission)  Recommendations for Other Services       Functional Status Assessment Patient has had a recent decline in their functional status and demonstrates the ability to make significant improvements in function in a reasonable and predictable amount of time.     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 Restrictions Weight Bearing Restrictions: No      Mobility  Bed Mobility Overal bed mobility: Needs Assistance Bed Mobility: Supine to Sit, Rolling Rolling: Max assist   Supine to sit: Max assist, +2 for physical  assistance       Patient Response: Cooperative  Transfers Overall transfer level: Needs assistance Equipment used: 2 person hand held assist Transfers: Bed to chair/wheelchair/BSC            Lateral/Scoot Transfers: Max assist, +2 physical assistance, +2 safety/equipment, With  slide board      Ambulation/Gait               General Gait Details: unsafe/unable  Stairs            Wheelchair Mobility    Modified Rankin (Stroke Patients Only)       Balance Overall balance assessment: Needs assistance Sitting-balance support: Feet supported, Bilateral upper extremity supported Sitting balance-Leahy Scale: Fair Sitting balance - Comments: min guard- min A for static sitting balance Postural control: Posterior lean Standing balance support: During functional activity Standing balance-Leahy Scale: Zero                               Pertinent Vitals/Pain Pain Assessment Pain Assessment: Faces Faces Pain Scale: Hurts even more Pain Location: buttocks and RLE around knee Pain Descriptors / Indicators: Discomfort, Grimacing, Guarding, Tender Pain Intervention(s): Monitored during session, Repositioned    Home Living Family/patient expects to be discharged to:: Private residence Living Arrangements: Spouse/significant other Available Help at Discharge: Family Type of Home: Mobile home Home Access: Stairs to enter Entrance Stairs-Rails: Psychiatric nurse of Steps: 6   Home Layout: One level Home Equipment: [place] - single Barista (2 wheels);Electric scooter;BSC/3in1;Wheelchair - Education administrator (comment) (slideboard)      Prior Function Prior Level of Function : Needs assist             Mobility Comments: recent PLOF was slide board transfers last admission ADLs Comments: Pt reports she has been needing assistance for all self care tasks recently and when she returned home she began needing +2 assistance for transfers.     Hand Dominance   Dominant Hand: Right    Extremity/Trunk Assessment   Upper Extremity Assessment Upper Extremity Assessment: Generalized weakness    Lower Extremity Assessment Lower Extremity Assessment: Generalized weakness;LLE deficits/detail LLE Deficits / Details:  AKA 08/14/22       Communication   Communication: No difficulties  Cognition Arousal/Alertness: Awake/alert Behavior During Therapy: WFL for tasks assessed/performed Overall Cognitive Status: Within Functional Limits for tasks assessed                                          General Comments      Exercises Other Exercises Other Exercises: importance of additional rehab to reduce falls risk/injury and for safer transition to home setting.   Assessment/Plan    PT Assessment Patient needs continued PT services  PT Problem List Decreased range of motion;Decreased mobility;Decreased knowledge of use of DME;Decreased safety awareness;Decreased activity tolerance;Decreased strength       PT Treatment Interventions DME instruction;Functional mobility training;Balance training;Patient/family education;Therapeutic activities;Neuromuscular re-education;Wheelchair mobility training;Therapeutic exercise;Cognitive remediation    PT Goals (Current goals can be found in the Care Plan section)  Acute Rehab PT Goals Patient Stated Goal: improve her pain PT Goal Formulation: With patient Time For Goal Achievement: 11/07/22 Potential to Achieve Goals: Fair    Frequency Min 2X/week     Co-evaluation   Reason for Co-Treatment: For patient/therapist safety;To address functional/ADL transfers PT goals addressed  during session: Mobility/safety with mobility;Balance OT goals addressed during session: ADL's and self-care       AM-PAC PT "6 Clicks" Mobility  Outcome Measure Help needed turning from your back to your side while in a flat bed without using bedrails?: A Lot Help needed moving from lying on your back to sitting on the side of a flat bed without using bedrails?: A Lot Help needed moving to and from a bed to a chair (including a wheelchair)?: A Lot Help needed standing up from a chair using your arms (e.g., wheelchair or bedside chair)?: Total Help needed to walk  in hospital room?: Total Help needed climbing 3-5 steps with a railing? : Total 6 Click Score: 9    End of Session Equipment Utilized During Treatment: Other (comment) (slideboard) Activity Tolerance: Patient limited by fatigue;Patient limited by pain Patient left: in chair;with call bell/phone within reach;with chair alarm set Nurse Communication: Mobility status PT Visit Diagnosis: Muscle weakness (generalized) (M62.81);Other abnormalities of gait and mobility (R26.89)    Time: [id number] PT Time Calculation (min) (ACUTE ONLY): 30 min   Charges:   PT Evaluation $PT Eval Moderate Complexity: 1 Mod PT Treatments $Therapeutic Activity: 8-22 mins        Madalee Altmann M. Fairly IV, PT, DPT Physical Therapist- [facility]  10/24/2022, 3:36 PM

## 2022-10-24 NOTE — Consult Note (Signed)
[facility] Nurse Consult Note: Reason for Consult: bilateral groin wounds Patient is very familiar to the [facility] nursing team, we have cared for her bilateral groin wounds along with vascular surgery at [facility] for many months.  She also had a Stage 3 Pressure Injury at the time of her last DC on the left posterior thigh Wound type: Surgical right groin: 100% clean; 2cm x 3cm x 1.0cm  Surgical left groin: 100% clean; 2cm x 2.8cmx 0.5cm  Pressure Injury POA: NA No evidence of pressure injury at this admission; area on the left posterior thigh is scabbed/healed  Measurement:see above  Wound bed:see above  Drainage (amount, consistency, odor) scant  Periwound: intact  Dressing procedure/placement/frequency: Patient has been in a SNF for some time and DC to home or left AMA. She had hydroferra blue dressings when she arrived this time to [facility]. I have updated wound care orders and changed wound dressings today  Cleanse bilateral groin wounds with saline, pat dry Cut to fit silver hydrofiber and place in wound bed, top with foam Change every other day Patient can turn side to side with assistance.  Silicone foam to her at risk areas over the sacrum and ischium.   Discussed POC with patient and bedside nurse.  Re consult if needed, will not follow at this time. Thanks  Aditri Louischarles [employer], RN,CWOCN, CNS, [place] [phone])

## 2022-10-24 NOTE — Evaluation (Signed)
Occupational Therapy Evaluation Patient Details Name: Heather [name] MRN: [id number] DOB: Sep 16, 1950 Today's Date: 10/24/2022   History of Present Illness Branigan Haneline is an 72 y.o. female seen in the emergency room today after a fall and has complaints of back pain.  She was transferring from the recliner to wheelchair when she accidentally slid off to the floor and fell on her bottom and her lower back started to spasm.   Clinical Impression   Patient presenting with decreased Ind in self care,balance, functional mobility/transfers, endurance, and safety awareness.  Patient with multiple admissions recently. She recently returned home and has not been doing well. Family having difficulty caring for her. Rehab staff were having to assist her with self care. Pt reports +2 assistance for transfer at home. Pt needing max A of 2 to roll pt L <> R for hygiene as pt reports having BM in bed. Barrier cream also applied. +2 assistance for supine >sit and total A to lean pt to the L for further hygiene and slide board placement. +2 assistance to slide board pt into chair with hoyer sling under pt for RN to lift pt back to bed. If pt were to return home recommended use of hoyer lift and sling to decrease fall risk at home. Patient will benefit from acute OT to increase overall independence in the areas of ADLs, functional mobility, and safety awareness in order to safely discharge to next venue of care.      Recommendations for follow up therapy are one component of a multi-disciplinary discharge planning process, led by the attending physician.  Recommendations may be updated based on patient status, additional functional criteria and insurance authorization.   Follow Up Recommendations  Skilled nursing-short term rehab (<3 hours/day)     Assistance Recommended at Discharge Frequent or constant Supervision/Assistance  Patient can return home with the following Two people to help with walking and/or transfers;A  lot of help with bathing/dressing/bathroom;Assistance with cooking/housework;Assistance with feeding;Direct supervision/assist for medications management;Direct supervision/assist for financial management;Assist for transportation;Help with stairs or ramp for entrance    Functional Status Assessment  Patient has had a recent decline in their functional status and demonstrates the ability to make significant improvements in function in a reasonable and predictable amount of time.  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      Mobility Bed Mobility Overal bed mobility: Needs Assistance Bed Mobility: Supine to Sit     Supine to sit: Max assist, +2 for physical assistance          Transfers Overall transfer level: Needs assistance Equipment used: 2 person hand held assist Transfers: Bed to chair/wheelchair/BSC            Lateral/Scoot Transfers: Max assist, +2 physical assistance, +2 safety/equipment, With slide board        Balance Overall balance assessment: Needs assistance Sitting-balance support: Feet supported Sitting balance-Leahy Scale: Fair Sitting balance - Comments: min guard- min A for static sitting balance Postural control: Posterior lean Standing balance support: During functional activity Standing balance-Leahy Scale: Poor                             ADL either performed or assessed with clinical judgement   ADL Overall ADL's : Needs assistance/impaired  Toilet Transfer: Maximal assistance;+2 for physical assistance Toilet Transfer Details (indicate cue type and reason): simulated transfers [place] and Hygiene: Sitting/lateral lean;Total assistance               Vision Patient Visual Report: No  change from baseline              Pertinent Vitals/Pain Pain Assessment Pain Assessment: Faces Faces Pain Scale: Hurts even more Pain Location: buttocks Pain Descriptors / Indicators: Discomfort, Grimacing, Guarding, Tender Pain Intervention(s): Monitored during session, Repositioned, Premedicated before session     Hand Dominance Right   Extremity/Trunk Assessment Upper Extremity Assessment Upper Extremity Assessment: Generalized weakness   Lower Extremity Assessment Lower Extremity Assessment: Generalized weakness       Communication Communication Communication: No difficulties   Cognition Arousal/Alertness: Awake/alert Behavior During Therapy: WFL for tasks assessed/performed Overall Cognitive Status: Within Functional Limits for tasks assessed                                                  Home Living Family/patient expects to be discharged to:: Private residence Living Arrangements: Spouse/significant other Available Help at Discharge: Family Type of Home: Mobile home Home Access: Stairs to enter Entrance Stairs-Number of Steps: 6 Entrance Stairs-Rails: Right;Left Home Layout: One level     Bathroom Shower/Tub: Teacher, early years/pre: Standard     Home Equipment: Cane - single Barista (2 wheels);Electric scooter;BSC/3in1;Wheelchair - Education administrator (comment) (slideboard)          Prior Functioning/Environment Prior Level of Function : Needs assist               ADLs Comments: Pt reports she has been needing assistance for all self care tasks recently and when she returned home she began needing +2 assistance for transfers.        OT Problem List: Decreased strength;Decreased activity tolerance;Impaired balance (sitting and/or standing);Decreased coordination;Decreased safety awareness;Decreased knowledge of use of DME or AE;Decreased knowledge of precautions;Pain;Increased edema      OT  Treatment/Interventions: Self-care/ADL training;Therapeutic exercise;Energy conservation;Neuromuscular education;DME and/or AE instruction;Manual therapy;Therapeutic activities;Cognitive remediation/compensation;Patient/family education;Balance training    OT Goals(Current goals can be found in the care plan section) Acute Rehab OT Goals Patient Stated Goal: to get better OT Goal Formulation: With patient Time For Goal Achievement: 11/07/22 Potential to Achieve Goals: Fair ADL Goals Pt Will Perform Grooming: with supervision;sitting Pt Will Perform Lower Body Dressing: with min assist;sitting/lateral leans Pt Will Transfer to Toilet: with min assist;with transfer board Pt Will Perform Toileting - Clothing Manipulation and hygiene: with min assist;sitting/lateral leans  OT Frequency: Min 2X/week    Co-evaluation PT/OT/SLP Co-Evaluation/Treatment: Yes Reason for Co-Treatment: For patient/therapist safety;To address functional/ADL transfers PT goals addressed during session: Mobility/safety with mobility;Balance OT goals addressed during session: ADL's and self-care      AM-PAC OT "6 Clicks" Daily Activity     Outcome Measure Help from another person eating meals?: A Little Help from another person taking care of personal grooming?: A Little Help from another person toileting, which includes using toliet, bedpan, or urinal?: Total Help from another person bathing (including washing, rinsing, drying)?: A Lot Help from another person to put on and taking off regular upper body clothing?: A Lot Help from another person to put on and taking off regular lower body clothing?: Total 6 Click Score: 12  End of Session Nurse Communication: Mobility status (sling placed under pt)  Activity Tolerance: Patient tolerated treatment well Patient left: in chair;with chair alarm set;with call bell/phone within reach  OT Visit Diagnosis: Unsteadiness on feet (R26.81);Other abnormalities of gait and  mobility (R26.89);Muscle weakness (generalized) (M62.81)                Time: [id number] OT Time Calculation (min): 30 min Charges:  OT General Charges $OT Visit: 1 Visit OT Evaluation $OT Eval Moderate Complexity: 1 [address], [place], OTR/L , CBIS ascom [phone]  10/24/22, 3:19 PM

## 2022-10-24 NOTE — Progress Notes (Signed)
PROGRESS NOTE  Heather [name] [id number] DOB: May 27, 1951   PCP: [name], FNP  Patient is from: Home  DOA: 10/23/2022 LOS: 1  Chief complaints Chief Complaint  Patient presents with   Fall     Brief Narrative / Interim history: 72 year old F with PMH of CVA, ICH, BLE ischemia s/p bilateral FEA and left AKA in 07/2022, TAVR, CAD, DM-2, COPD, HTN, diastolic CHF, anxiety, seizure and recent hospitalization from 2/24-2/28 for bilateral inguinal wound infection for which she was discharged on IV Zosyn until 10/28/2022 followed by Cipro and Flagyl for another month, returning with acute back pain after accidental fall while transferring from recliner to wheelchair.  Patient refused to go to SNF and discharged home with home health and DME last hospitalization.  Left AMA from SNF before that.  This is his fifth admission this year.  Now she is agreeable to go to SNF.    Subjective: Seen and examined earlier this morning.  No major events overnight of this morning.  Complains severe right knee pain.  She said this pain is acute.  Not sure if she fell on her right knee.  She has no bruising or swelling.  No tenderness but limited ROM with flexion and extension of right knee.  She states she is not on pain medication at home.  Objective: Vitals:   10/24/22 0319 10/24/22 0500 10/24/22 0828 10/24/22 1221  BP: 134/66  (!) 147/72 (!) 136/55  Pulse: 81  92 96  Resp: '18  18 18  '$ Temp: 98.1 F (36.7 C)  98.6 F (37 C) 99.2 F (37.3 C)  TempSrc:   Oral Oral  SpO2: 92%  97% 95%  Weight:  49.9 kg      Examination:  GENERAL: No apparent distress.  Nontoxic. HEENT: MMM.  Vision and hearing grossly intact.  NECK: Supple.  No apparent JVD.  RESP:  No IWOB.  Fair aeration bilaterally. CVS:  RRR. Heart sounds normal.  ABD/GI/GU: BS+. Abd soft, NTND.  MSK/EXT:  Moves extremities.  Limited ROM in right knee.  No swelling or tenderness.  Left AKA. SKIN: RLE fasciotomy wounds healed. NEURO:  Awake, alert and oriented appropriately.  No apparent focal neuro deficit. PSYCH: Calm. Normal affect.   Right and left groin      Procedures:  None  Microbiology summarized: None  Assessment and plan: Principal Problem:   Fall at home, initial encounter Active Problems:   Diabetes mellitus ([place])   Hypertension, benign   Coronary artery disease   COPD (chronic obstructive pulmonary disease) (HCC)   Tobacco use disorder   PAD (peripheral artery disease) (HCC)   History of intracranial hemorrhage   Chronic diastolic CHF (congestive heart failure) (HCC)   Generalized weakness   Open wound of inguinal region with complication   Hx of AKA (above knee amputation), left ([place])   History of seizure   Right knee pain   Accidental fall at home: Slipped and fell while transferring from recliner to wheelchair.  Denies hitting head or LOC.  Had acute back pain after fall.  Lumbar MRI without acute finding other than chronic degenerative changes.  Patient has left AKA.  Now with acute right knee pain.  Previously refused to go to rehab.  She is now agreeable. -Check CK -PT/OT eval -Fall precaution -Pain control with scheduled Tylenol, Voltaren gel and as needed oxycodone -Bowel regimen to avoid constipation  PAD s/p bilateral FEA, left AKA and RLE fasciotomy in 07/2022.  Was discharged on IV Zosyn for  bilateral groin wound infection.  Appears clean.  No apparent drainage. -Continue IV Zosyn followed by Cipro and Flagyl as previously planned -Continue home Plavix -Consult WOCN for wound care  Uncontrolled IDDM-2 with hypo and hyperglycemia: A1c 12.9% on 08/05/2022. Recent Labs  Lab 10/24/22 0053 10/24/22 0510 10/24/22 0607 10/24/22 0830 10/24/22 1222  GLUCAP 131* 67* 77 83 108*  -Decrease SSI to moderate -Continue Levemir 10 units twice daily. -Continue home Lipitor -Recheck hemoglobin A1c  History of CVA/intracranial hemorrhage in 07/2022: Stable.  Chronic COPD:  Stable. -Continue home inhalers.  Chronic diastolic CHF?  Relatively normal echocardiogram in 07/2022.  Not on diuretics.  Appears euvolemic.  History of CAD: Stable. -Continue home Coreg, statin and Plavix.   History of seizure: Stable. -Continue home [place].   Essential hypertension: Blood pressure within acceptable range. -Continue home medications  Acute right knee pain: Patient reports severe acute right knee pain.  No skin color change, swelling or tenderness on exam but limited range of motion due to pain.  X-ray without acute finding.  Uric acid 2.5. -Schedule Tylenol -Start gabapentin 200 mg twice daily with breakfast and lunch and 400 mg at night -Voltaren gel -P.o. oxycodone 5 mg every 6 hours as needed severe pain -Check UDS   Tobacco use disorder -Encouraged smoking cessation -Continue nicotine patch  Hypokalemia -Replenish and recheck as appropriate   AKI ruled out  Body mass index is 21.48 kg/m.    DVT prophylaxis:  enoxaparin (LOVENOX) injection 40 mg Start: 10/24/22 1345 SCDs Start: 10/23/22 2316  Code Status: Full code Family Communication: None at bedside Level of care: Med-Surg Status is: Inpatient Remains inpatient appropriate because: Fall at home   Final disposition: SNF Consultants:  None  55 minutes with more than 50% spent in reviewing records, counseling patient/family and coordinating care.   Sch Meds:  Scheduled Meds:  acetaminophen  1,000 mg Oral Q8H   amLODipine  10 mg Oral Daily   atorvastatin  80 mg Oral QHS   carvedilol  6.25 mg Oral BID WC   Chlorhexidine Gluconate Cloth  6 each Topical Daily   diclofenac Sodium  2 g Topical QID   enoxaparin (LOVENOX) injection  40 mg Subcutaneous Q24H   escitalopram  10 mg Oral Daily   fluticasone furoate-vilanterol  1 puff Inhalation Daily   [START ON 10/25/2022] gabapentin  200 mg Oral BID WC   And   gabapentin  400 mg Oral QHS   hydrALAZINE  50 mg Oral TID   insulin aspart  0-15  Units Subcutaneous TID WC   insulin detemir  10 Units Subcutaneous BID   levETIRAcetam  500 mg Oral BID   multivitamin with minerals  1 tablet Oral Daily   nicotine  14 mg Transdermal Daily   sodium chloride flush  3 mL Intravenous Q12H   Continuous Infusions:  piperacillin-tazobactam 12.5 mL/hr at 10/24/22 1200   PRN Meds:.hydrALAZINE, oxyCODONE, polyethylene glycol, senna-docusate  Antimicrobials: Anti-infectives (From admission, onward)    Start     Dose/Rate Route Frequency Ordered Stop   10/24/22 0045  piperacillin-tazobactam (ZOSYN) IVPB 3.375 g        3.375 g 12.5 mL/hr over 240 Minutes Intravenous Every 8 hours 10/23/22 2333          I have personally reviewed the following labs and images: CBC: Recent Labs  Lab 10/17/22 2127 10/18/22 0424 10/23/22 2043 10/24/22 0818  WBC 9.4 6.8 10.9* 8.0  NEUTROABS  --   --  8.8*  --  HGB 11.0* 9.6* 10.5* 11.5*  HCT 35.3* 30.5* 33.8* 36.2  MCV 91.5 90.0 91.8 90.5  PLT 453* 412* 449* 487*   BMP &GFR Recent Labs  Lab 10/17/22 2127 10/18/22 0424 10/23/22 2043 10/24/22 0818  NA 137 138 137 140  K 3.6 3.5 3.4* 3.4*  CL 102 105 101 103  CO2 '24 27 28 26  '$ GLUCOSE 247* 171* 156* 82  BUN '16 14 12 9  '$ CREATININE 0.80 0.65 0.60 0.54  CALCIUM 9.3 9.0 9.3 9.3  MG  --   --   --  1.7  PHOS  --   --   --  3.6   Estimated Creatinine Clearance: 47.7 mL/min (by C-G formula based on SCr of 0.54 mg/dL). Liver & Pancreas: Recent Labs  Lab 10/23/22 2043 10/24/22 0818  AST 72*  --   ALT 165*  --   ALKPHOS 342*  --   BILITOT 0.5  --   PROT 6.6  --   ALBUMIN 2.8* 3.0*   No results for input(s): "LIPASE", "AMYLASE" in the last 168 hours. No results for input(s): "AMMONIA" in the last 168 hours. Diabetic: No results for input(s): "HGBA1C" in the last 72 hours. Recent Labs  Lab 10/24/22 0053 10/24/22 0510 10/24/22 0607 10/24/22 0830 10/24/22 1222  GLUCAP 131* 67* 77 83 108*   Cardiac Enzymes: Recent Labs  Lab  10/24/22 1000  CKTOTAL 28*   No results for input(s): "PROBNP" in the last 8760 hours. Coagulation Profile: No results for input(s): "INR", "PROTIME" in the last 168 hours. Thyroid Function Tests: No results for input(s): "TSH", "T4TOTAL", "FREET4", "T3FREE", "THYROIDAB" in the last 72 hours. Lipid Profile: No results for input(s): "CHOL", "HDL", "LDLCALC", "TRIG", "CHOLHDL", "LDLDIRECT" in the last 72 hours. Anemia Panel: No results for input(s): "VITAMINB12", "FOLATE", "FERRITIN", "TIBC", "IRON", "RETICCTPCT" in the last 72 hours. Urine analysis:    Component Value Date/Time   COLORURINE STRAW (A) 10/01/2022 1849   APPEARANCEUR CLEAR 10/01/2022 1849   APPEARANCEUR Clear 06/20/2012 2127   LABSPEC 1.010 10/01/2022 1849   LABSPEC 1.022 06/20/2012 2127   PHURINE 5.0 10/01/2022 1849   GLUCOSEU NEGATIVE 10/01/2022 1849   GLUCOSEU >=500 06/20/2012 2127   HGBUR SMALL (A) 10/01/2022 1849   BILIRUBINUR NEGATIVE 10/01/2022 1849   BILIRUBINUR Negative 06/20/2012 2127   [place] NEGATIVE 10/01/2022 1849   PROTEINUR 30 (A) 10/01/2022 1849   NITRITE NEGATIVE 10/01/2022 1849   LEUKOCYTESUR NEGATIVE 10/01/2022 1849   LEUKOCYTESUR Negative 06/20/2012 2127   Sepsis Labs: Invalid input(s): "PROCALCITONIN", "LACTICIDVEN"  Microbiology: No results found for this or any previous visit (from the past 240 hour(s)).  Radiology Studies: DG Knee 1-2 Views Right  Result Date: 10/24/2022 CLINICAL DATA:  Right knee pain after fall last night. EXAM: RIGHT KNEE - 1-2 VIEW COMPARISON:  None Available. FINDINGS: No evidence of fracture, dislocation, or joint effusion. No evidence of arthropathy or other focal bone abnormality. Vascular calcifications are noted as well as stent in superficial femoral artery. IMPRESSION: No acute abnormality seen. Electronically Signed   By: Marijo Conception M.D.   On: 10/24/2022 09:50   US Abdomen Limited RUQ (LIVER/GB)  Result Date: 10/23/2022 CLINICAL DATA:  Abnormal  LFTs.  History of cholecystectomy. EXAM: ULTRASOUND ABDOMEN LIMITED RIGHT UPPER QUADRANT COMPARISON:  CT 11/10/2020 FINDINGS: Gallbladder: Cholecystectomy. Common bile duct: Diameter: 8 mm.  No intrahepatic biliary dilation. Liver: No focal lesion identified. Within normal limits in parenchymal echogenicity. Portal vein is patent on color Doppler imaging with normal direction of blood  flow towards the liver. Other: None. IMPRESSION: 1. No sonographic abnormality. 2. Common bile duct measures 8 mm, which is likely due to reservoir effect status post cholecystectomy. Electronically Signed   By: Placido Sou M.D.   On: 10/23/2022 23:57   MR Lumbar Spine W Wo Contrast  Result Date: 10/23/2022 CLINICAL DATA:  Initial evaluation for low back pain, fall, infection suspected. EXAM: MRI LUMBAR SPINE WITHOUT AND WITH CONTRAST TECHNIQUE: Multiplanar and multiecho pulse sequences of the lumbar spine were obtained without and with intravenous contrast. CONTRAST:  3m GADAVIST GADOBUTROL 1 MMOL/ML IV SOLN COMPARISON:  Radiograph from 10/18/2022. FINDINGS: Segmentation: Standard. Lowest well-formed disc space labeled the L5-S1 level. Alignment: Mild levoscoliosis. Trace 3 mm facet mediated anterolisthesis of L4 on L5. Vertebrae: Mild chronic compression deformities involving the superior endplates of T624THLand [id number]. Vertebral body height otherwise maintained with no acute or recent fracture. Bone marrow signal intensity within normal limits. No worrisome osseous lesions. Prominent endplate changes with marrow edema and enhancement about the L4-5 and L5-S1 interspaces, favored to be degenerative. No other evidence for acute infection within the lumbar spine. Conus medullaris and cauda equina: Conus extends to the L1 level. Conus and cauda equina appear normal. Paraspinal and other soft tissues: Paraspinous soft tissues demonstrate no acute finding. Colonic diverticulosis noted. Small volume free fluid noted within the  presacral space, nonspecific. Probable small uterine fibroid noted as well. Disc levels: T10-11: Seen only on sagittal projection. Minimal disc bulge with trace bony retropulsion related to the T11 compression fracture. No significant spinal stenosis. T11-12: Disc desiccation with minimal disc bulge.  No stenosis. T12-L1: Unremarkable. L1-2:  Unremarkable. L2-3:  Unremarkable. L3-4: Mild degenerative intervertebral disc space narrowing with diffuse disc bulge and disc desiccation. Mild left facet hypertrophy. No spinal stenosis. Foramina remain patent. L4-5: Advanced degenerative intervertebral disc space narrowing with diffuse disc bulge and disc desiccation. Associated reactive endplate change with marginal endplate osteophytic spurring. Superimposed left subarticular disc protrusion with slight inferior migration (series 8, image 27). Moderate right worse than left facet hypertrophy. Resultant moderate canal with bilateral subarticular stenosis, worse on the left. Moderate right with mild to moderate left L4 foraminal narrowing. L5-S1: Advanced degenerative intervertebral disc space narrowing with disc desiccation and diffuse disc bulge. Associated reactive endplate spurring. Mild left greater than right facet hypertrophy. Mild epidural lipomatosis. No significant spinal stenosis. Moderate left L5 foraminal narrowing. Right neural foramen remains patent. IMPRESSION: 1. No MRI evidence for acute infection within the lumbar spine. 2. 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 3. Degenerative disc bulge with facet hypertrophy at L5-S1 with resultant moderate left L5 foraminal stenosis. 4. Prominent endplate changes with marrow edema and enhancement about the L4-5 and L5-S1 interspaces, favored to be degenerative in nature. Finding could serve as a source for lower back pain. 5. Mild chronic  compression deformities involving the superior endplates of T624THLand [id number] 6. Small volume free fluid within the presacral space, nonspecific. 7. Colonic diverticulosis. Electronically Signed   By: BJeannine BogaM.D.   On: 10/23/2022 21:58      [place]. [place] If 7PM-7AM, please contact night-coverage [email or web address] 10/24/2022, 12:58 PM

## 2022-10-24 NOTE — Assessment & Plan Note (Signed)
Pt is deconditioned and needs assistance is not safe to transfer or manage herself at home.  PT eval and SNF.

## 2022-10-24 NOTE — Telephone Encounter (Signed)
PT is currently admitted to hospital [facility]. Will need her post-op with Dr. Donzetta Matters rescheduled. Dr. Donzetta Matters suggest pt has [facility] womb care check pt's womb and get [facility] involved if needed.

## 2022-10-24 NOTE — Plan of Care (Signed)

## 2022-10-25 DIAGNOSIS — R7401 Elevation of levels of liver transaminase levels: Secondary | ICD-10-CM

## 2022-10-25 LAB — GLUCOSE, CAPILLARY
Glucose-Capillary: 103 mg/dL — ABNORMAL HIGH (ref 70–99)
Glucose-Capillary: 201 mg/dL — ABNORMAL HIGH (ref 70–99)
Glucose-Capillary: 132 mg/dL — ABNORMAL HIGH (ref 70–99)
Glucose-Capillary: 159 mg/dL — ABNORMAL HIGH (ref 70–99)

## 2022-10-25 LAB — CBC
HCT: 29.9 % — ABNORMAL LOW (ref 36.0–46.0)
Hemoglobin: 9.5 g/dL — ABNORMAL LOW (ref 12.0–15.0)
MCH: 28.8 pg (ref 26.0–34.0)
MCHC: 31.8 g/dL (ref 30.0–36.0)
MCV: 90.6 fL (ref 80.0–100.0)
Platelets: 419 10*3/uL — ABNORMAL HIGH (ref 150–400)
RBC: 3.3 MIL/uL — ABNORMAL LOW (ref 3.87–5.11)
RDW: 16.7 % — ABNORMAL HIGH (ref 11.5–15.5)
WBC: 5.5 10*3/uL (ref 4.0–10.5)
nRBC: 0 % (ref 0.0–0.2)

## 2022-10-25 LAB — RENAL FUNCTION PANEL
Albumin: 2.5 g/dL — ABNORMAL LOW (ref 3.5–5.0)
Anion gap: 9 (ref 5–15)
BUN: 12 mg/dL (ref 8–23)
CO2: 25 mmol/L (ref 22–32)
Calcium: 8.9 mg/dL (ref 8.9–10.3)
Chloride: 107 mmol/L (ref 98–111)
Creatinine, Ser: 0.68 mg/dL (ref 0.44–1.00)
GFR, Estimated: >60 mL/min (ref >60)
Glucose, Bld: 94 mg/dL (ref 70–99)
Phosphorus: 3.9 mg/dL (ref 2.5–4.6)
Potassium: 3.2 mmol/L — ABNORMAL LOW (ref 3.5–5.1)
Sodium: 141 mmol/L (ref 135–145)

## 2022-10-25 LAB — MAGNESIUM: Magnesium: 1.8 mg/dL (ref 1.7–2.4)

## 2022-10-25 MED ORDER — POTASSIUM CHLORIDE 20 MEQ PO PACK
40.0000 meq | PACK | Freq: Two times a day (BID) | ORAL | Status: AC
  Administered 2022-10-25 – 2022-10-26 (×3): 40 meq via ORAL
  Filled 2022-10-25 (×3): qty 2

## 2022-10-25 MED ORDER — TRAZODONE HCL 50 MG PO TABS
50.0000 mg | ORAL_TABLET | Freq: Every day | ORAL | Status: AC
  Administered 2022-10-25: 50 mg via ORAL
  Filled 2022-10-25: qty 1

## 2022-10-25 NOTE — TOC Initial Note (Signed)
Transition of Care [facility]) - Initial/Assessment Note    Patient Details  Name: Heather [name] MRN: [id number] Date of Birth: 1950/11/26  Transition of [facility]) CM/SW Contact:    [address], [place] Phone Number: 10/25/2022, 3:20 PM  Clinical Narrative:                  CSW spoke with pt about SNF recommendation, pt agreeable. CSW discussed after chart review it appears pt may have about 3-4 days left before possible copay status. Pt states she understands that she may have to pay out of pocket, expresses local facility and states ok to fax out to [place] area "as last resort". CSW explained once bed offers are given we may have a better understanding of copay amounts, pt states she understands.         Patient Goals and CMS Choice            Expected Discharge Plan and Services                                              Prior Living Arrangements/Services                       Activities of Daily Living      Permission Sought/Granted                  Emotional Assessment              Admission diagnosis:  Fall [W19.XXXA] Patient Active Problem List   Diagnosis Date Noted   Right knee pain 10/24/2022   History of seizure 10/23/2022   Hypoglycemia 10/21/2022   Pressure injury of skin 10/21/2022   Weakness 10/21/2022   Cellulitis of right groin 10/18/2022   Generalized weakness 10/18/2022   Fall at home, initial encounter 10/18/2022   Open wound of inguinal region with complication [id number]   Hx of AKA (above knee amputation), left ([place]) 10/18/2022   Uncontrolled type 2 diabetes mellitus with hyperglycemia, with long-term current use of insulin ([place]) 10/18/2022   Failure to thrive in adult [id number]   Acute metabolic encephalopathy [id number]   Normocytic anemia 10/01/2022   Surgical site infection 09/15/2022   Decreased oral intake 09/13/2022   Chronic diastolic CHF (congestive heart failure) ([place]) 09/13/2022   Dysphagia  09/13/2022   PVD (peripheral vascular disease) ([place]) 09/11/2022   Right groin wound 09/10/2022   Wound of left groin 09/10/2022   Pulmonary edema 08/16/2022   Abscess of anal and rectal regions 08/15/2022   Decubitus ulcer of sacral region, stage 2 ([place]) 08/15/2022   History of intracranial hemorrhage 08/04/2022   Atherosclerosis of native arteries of extremity with rest pain ([place]) 04/02/2022   PAD (peripheral artery disease) ([place]) 04/11/2021   Ischemic leg pain 04/11/2021   Chronic mesenteric ischemia ([place]) 12/17/2020   Malnutrition of moderate degree 11/12/2020   Asymptomatic bacteriuria 11/10/2020   Abdominal pain 11/09/2020   Hypokalemia 11/09/2020   MDD (major depressive disorder), recurrent, in full remission ([place]) 01/16/2020   Anxiety disorder 01/16/2020   Hyperlipidemia 12/15/2019   Carotid stenosis 12/15/2019   MDD (major depressive disorder), recurrent, in partial remission ([place]) 08/24/2019   MDD (major depressive disorder), recurrent episode, mild ([place]) 04/08/2019   Insomnia due to mental condition 04/08/2019   Major neurocognitive disorder due to Alzheimer's disease, probable, without behavioral  disturbance 04/08/2019   DDD (degenerative disc disease), cervical 12/19/2016   Frequent falls 12/10/2016   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   Nonrheumatic aortic valve stenosis 09/30/2016   (HFpEF) heart failure with preserved ejection fraction ([place]) 09/20/2016   COPD (chronic obstructive pulmonary disease) ([place]) 09/20/2016   Respiratory failure with hypoxia ([place]) 09/20/2016   NSTEMI (non-ST elevated myocardial infarction) ([place]) 08/21/2016   SOB (shortness of breath) 08/18/2016   Atherosclerosis of native arteries of extremity with intermittent claudication ([place]) 01/08/2016   Palpitations 07/10/2015   History of nonmelanoma skin cancer 03/16/2015   Pseudoaneurysm following procedure ([place]) 07/19/2014   Femoral artery occlusion, right ([place])  06/28/2014   Claudication ([place]) 06/05/2014   Aortic insufficiency 03/20/2014   Coronary artery disease 03/20/2014   Chronic pain 03/20/2014   Memory loss 11/09/2012   Depressive disorder 08/02/2012   Neck pain 08/02/2012   Malaise and fatigue 08/02/2012   Hypertension, benign 08/02/2012   Dizziness 08/02/2012   Polyneuropathy in diabetes ([place]) 08/02/2012   Rheumatic fever with heart involvement 08/02/2012   Tobacco use disorder 08/02/2012   Diabetes mellitus ([place]) 08/02/1990   PCP:  [name], FNP Pharmacy:   [place], [address] [place] [address] Phone: [phone] Fax: [phone]     Social Determinants of Health (SDOH) Social History: SDOH Screenings   Food Insecurity: No Food Insecurity (10/18/2022)  Recent Concern: Food Insecurity - Food Insecurity Present (08/11/2022)  Housing: Low Risk  (10/18/2022)  Transportation Needs: No Transportation Needs (10/18/2022)  Utilities: Not At Risk (10/18/2022)  Financial Resource Strain: Medium Risk (10/19/2018)  Physical Activity: Inactive (10/19/2018)  Social Connections: Unknown (10/19/2018)  Tobacco Use: High Risk (10/17/2022)   SDOH Interventions:     Readmission Risk Interventions    11/13/2020    2:27 PM  Readmission Risk Prevention Plan  Transportation Screening Complete  PCP or Specialist Appt within 3-5 Days Complete  HRI or [place] Complete  Social Work Consult for [place] Planning/Counseling Complete  Palliative Care Screening Not Applicable  Medication Review Press photographer) Complete

## 2022-10-25 NOTE — Progress Notes (Signed)
[name] PROGRESS NOTE  Heather [name] [id number] DOB: 1950/09/18   PCP: [name], FNP  Patient is from: Home  DOA: 10/23/2022 LOS: 2  Chief complaints Chief Complaint  Patient presents with   Fall     Brief Narrative / Interim history: 72 year old F with PMH of CVA, ICH, BLE ischemia s/p bilateral FEA and left AKA in 07/2022, TAVR, CAD, DM-2, COPD, HTN, diastolic CHF, anxiety, seizure and recent hospitalization from 2/24-2/28 for bilateral inguinal wound infection for which she was discharged on IV Zosyn until 10/28/2022 followed by Cipro and Flagyl for another month, returning with acute back pain after accidental fall while transferring from recliner to wheelchair.  Patient refused to go to SNF and discharged home with home health and DME last hospitalization.  Left AMA from SNF before that.  This is his fifth admission this year.  Now she is agreeable to go to SNF.    Subjective: Seen and examined earlier this morning.  No major events overnight of this morning.  Mild hypokalemia with potassium 3.1 repleted.  Follow-up BMP in the morning.  Patient pending SNF placement.  Objective: Vitals:   10/24/22 1929 10/24/22 2337 10/25/22 0304 10/25/22 0800  BP: (!) 128/57 133/62 (!) 122/50 134/75  Pulse: 100 76 73 76  Resp: '18 18 16 17  '$ Temp: 98.8 F (37.1 C) 98 F (36.7 C) 98.9 F (37.2 C) 98.7 F (37.1 C)  TempSrc: Oral     SpO2: 94% 96% 96% 95%  Weight:        Examination:  GENERAL: No apparent distress.  Nontoxic. HEENT: MMM.  Vision and hearing grossly intact.  NECK: Supple.  No apparent JVD.  RESP:  No IWOB.  Fair aeration bilaterally. CVS:  RRR. Heart sounds normal.  ABD/GI/GU: BS+. Abd soft, NTND.  MSK/EXT:  Moves extremities.  Limited ROM in right knee.  No swelling or tenderness.  Left AKA. SKIN: RLE fasciotomy wounds healed. NEURO: Awake, alert and oriented appropriately.  No apparent focal neuro deficit. PSYCH: Calm. Normal affect.   Right and left  groin      Procedures:  None  Microbiology summarized: None  Assessment and plan: Principal Problem:   Fall at home, initial encounter Active Problems:   Diabetes mellitus ([place])   Hypertension, benign   Coronary artery disease   COPD (chronic obstructive pulmonary disease) (HCC)   Tobacco use disorder   PAD (peripheral artery disease) (HCC)   History of intracranial hemorrhage   Chronic diastolic CHF (congestive heart failure) (HCC)   Generalized weakness   Open wound of inguinal region with complication   Hx of AKA (above knee amputation), left ([place])   History of seizure   Right knee pain   Accidental fall at home: Slipped and fell while transferring from recliner to wheelchair.  Denies hitting head or LOC.  Had acute back pain after fall.  Lumbar MRI without acute finding other than chronic degenerative changes.  Patient has left AKA.  Now with acute right knee pain.  Previously refused to go to rehab.  She is now agreeable. -Check CK -PT/OT eval -Fall precaution -Pain control with scheduled Tylenol, Voltaren gel and as needed oxycodone -Bowel regimen to avoid constipation  PAD s/p bilateral FEA, left AKA and RLE fasciotomy in 07/2022.  Was discharged on IV Zosyn for bilateral groin wound infection.  Appears clean.  No apparent drainage. -Continue IV Zosyn followed by Cipro and Flagyl as previously planned -Continue home Plavix -Consult WOCN for wound care  Uncontrolled IDDM-2  with hypo and hyperglycemia: A1c 12.9% on 08/05/2022. Recent Labs  Lab 10/24/22 0830 10/24/22 1222 10/24/22 1528 10/24/22 2025 10/25/22 0830  GLUCAP 83 108* 125* 234* 103*  -Decrease SSI to moderate -Continue Levemir 10 units twice daily. -Continue home Lipitor -Recheck hemoglobin A1c  History of CVA/intracranial hemorrhage in 07/2022: Stable.  Chronic COPD: Stable. -Continue home inhalers.  Chronic diastolic CHF?  Relatively normal echocardiogram in 07/2022.  Not on diuretics.   Appears euvolemic.  History of CAD: Stable. -Continue home Coreg, statin and Plavix.   History of seizure: Stable. -Continue home [place].   Essential hypertension: Blood pressure within acceptable range. -Continue home medications  Acute right knee pain: Patient reports severe acute right knee pain.  No skin color change, swelling or tenderness on exam but limited range of motion due to pain.  X-ray without acute finding.  Uric acid 2.5. -Schedule Tylenol -Start gabapentin 200 mg twice daily with breakfast and lunch and 400 mg at night -Voltaren gel -P.o. oxycodone 5 mg every 6 hours as needed severe pain -Check UDS   Tobacco use disorder -Encouraged smoking cessation -Continue nicotine patch  Hypokalemia -Replenish and recheck as appropriate   AKI ruled out  Body mass index is 21.48 kg/m.    DVT prophylaxis:  enoxaparin (LOVENOX) injection 40 mg Start: 10/24/22 1400 SCDs Start: 10/23/22 2316  Code Status: Full code Family Communication: None at bedside Level of care: Med-Surg Status is: Inpatient Remains inpatient appropriate because: Fall at home   Final disposition: SNF Consultants:  None  55 minutes with more than 50% spent in reviewing records, counseling patient/family and coordinating care.   Sch Meds:  Scheduled Meds:  acetaminophen  1,000 mg Oral Q8H   amLODipine  10 mg Oral Daily   atorvastatin  80 mg Oral QHS   carvedilol  6.25 mg Oral BID WC   Chlorhexidine Gluconate Cloth  6 each Topical Daily   diclofenac Sodium  2 g Topical QID   enoxaparin (LOVENOX) injection  40 mg Subcutaneous Q24H   escitalopram  10 mg Oral Daily   fluticasone furoate-vilanterol  1 puff Inhalation Daily   gabapentin  200 mg Oral BID WC   And   gabapentin  400 mg Oral QHS   hydrALAZINE  50 mg Oral TID   insulin aspart  0-15 Units Subcutaneous TID WC   insulin detemir  10 Units Subcutaneous BID   levETIRAcetam  500 mg Oral BID   multivitamin with minerals  1 tablet  Oral Daily   nicotine  14 mg Transdermal Daily   potassium chloride  40 mEq Oral BID   sodium chloride flush  3 mL Intravenous Q12H   Continuous Infusions:  piperacillin-tazobactam 3.375 g (10/25/22 0930)   PRN Meds:.hydrALAZINE, oxyCODONE, polyethylene glycol, senna-docusate  Antimicrobials: Anti-infectives (From admission, onward)    Start     Dose/Rate Route Frequency Ordered Stop   10/24/22 0045  piperacillin-tazobactam (ZOSYN) IVPB 3.375 g        3.375 g 12.5 mL/hr over 240 Minutes Intravenous Every 8 hours 10/23/22 2333          I have personally reviewed the following labs and images: CBC: Recent Labs  Lab 10/23/22 2043 10/24/22 0818 10/25/22 0539  WBC 10.9* 8.0 5.5  NEUTROABS 8.8*  --   --   HGB 10.5* 11.5* 9.5*  HCT 33.8* 36.2 29.9*  MCV 91.8 90.5 90.6  PLT 449* 487* 419*   BMP &GFR Recent Labs  Lab 10/23/22 2043 10/24/22 0818 10/25/22 0539  NA 137 140 141  K 3.4* 3.4* 3.2*  CL 101 103 107  CO2 '28 26 25  '$ GLUCOSE 156* 82 94  BUN '12 9 12  '$ CREATININE 0.60 0.54 0.68  CALCIUM 9.3 9.3 8.9  MG  --  1.7 1.8  PHOS  --  3.6 3.9   Estimated Creatinine Clearance: 47.7 mL/min (by C-G formula based on SCr of 0.68 mg/dL). Liver & Pancreas: Recent Labs  Lab 10/23/22 2043 10/24/22 0818 10/25/22 0539  AST 72*  --   --   ALT 165*  --   --   ALKPHOS 342*  --   --   BILITOT 0.5  --   --   PROT 6.6  --   --   ALBUMIN 2.8* 3.0* 2.5*   No results for input(s): "LIPASE", "AMYLASE" in the last 168 hours. No results for input(s): "AMMONIA" in the last 168 hours. Diabetic: No results for input(s): "HGBA1C" in the last 72 hours. Recent Labs  Lab 10/24/22 0830 10/24/22 1222 10/24/22 1528 10/24/22 2025 10/25/22 0830  GLUCAP 83 108* 125* 234* 103*   Cardiac Enzymes: Recent Labs  Lab 10/24/22 1000  CKTOTAL 28*   No results for input(s): "PROBNP" in the last 8760 hours. Coagulation Profile: No results for input(s): "INR", "PROTIME" in the last 168  hours. Thyroid Function Tests: No results for input(s): "TSH", "T4TOTAL", "FREET4", "T3FREE", "THYROIDAB" in the last 72 hours. Lipid Profile: No results for input(s): "CHOL", "HDL", "LDLCALC", "TRIG", "CHOLHDL", "LDLDIRECT" in the last 72 hours. Anemia Panel: No results for input(s): "VITAMINB12", "FOLATE", "FERRITIN", "TIBC", "IRON", "RETICCTPCT" in the last 72 hours. Urine analysis:    Component Value Date/Time   COLORURINE STRAW (A) 10/01/2022 1849   APPEARANCEUR CLEAR 10/01/2022 1849   APPEARANCEUR Clear 06/20/2012 2127   LABSPEC 1.010 10/01/2022 1849   LABSPEC 1.022 06/20/2012 2127   PHURINE 5.0 10/01/2022 1849   GLUCOSEU NEGATIVE 10/01/2022 1849   GLUCOSEU >=500 06/20/2012 2127   HGBUR SMALL (A) 10/01/2022 1849   BILIRUBINUR NEGATIVE 10/01/2022 1849   BILIRUBINUR Negative 06/20/2012 2127   [place] NEGATIVE 10/01/2022 1849   PROTEINUR 30 (A) 10/01/2022 1849   NITRITE NEGATIVE 10/01/2022 1849   LEUKOCYTESUR NEGATIVE 10/01/2022 1849   LEUKOCYTESUR Negative 06/20/2012 2127   Sepsis Labs: Invalid input(s): "PROCALCITONIN", "LACTICIDVEN"  Microbiology: No results found for this or any previous visit (from the past 240 hour(s)).  Radiology Studies: No results found.     If 7PM-7AM, please contact night-coverage [email or web address] 10/25/2022, 11:28 AM

## 2022-10-25 NOTE — NC FL2 (Signed)
[place] LEVEL OF CARE FORM     IDENTIFICATION  Patient Name: Heather [name] Birthdate: 04/13/1951 Sex: female Admission Date (Current Location): 10/23/2022  [facility] and [place] Number:  Engineering geologist and Address:  [facility], [address] [place], [place] [address]      Provider Number: [id number]  Attending Physician Name and Address:  Oran Rein, MD  Relative Name and Phone Number:  Woolridge,Shorty (Spouse)  [phone]    Current Level of Care: Hospital Recommended Level of Care: [place] Prior Approval Number:    Date Approved/Denied:   PASRR Number: [id number]  Discharge Plan: SNF    Current Diagnoses: Patient Active Problem List   Diagnosis Date Noted   Right knee pain 10/24/2022   History of seizure 10/23/2022   Hypoglycemia 10/21/2022   Pressure injury of skin 10/21/2022   Weakness 10/21/2022   Cellulitis of right groin 10/18/2022   Generalized weakness 10/18/2022   Fall at home, initial encounter 10/18/2022   Open wound of inguinal region with complication [id number]   Hx of AKA (above knee amputation), left ([place]) 10/18/2022   Uncontrolled type 2 diabetes mellitus with hyperglycemia, with long-term current use of insulin ([place]) 10/18/2022   Failure to thrive in adult [id number]   Acute metabolic encephalopathy [id number]   Normocytic anemia 10/01/2022   Surgical site infection 09/15/2022   Decreased oral intake 09/13/2022   Chronic diastolic CHF (congestive heart failure) ([place]) 09/13/2022   Dysphagia 09/13/2022   PVD (peripheral vascular disease) ([place]) 09/11/2022   Right groin wound 09/10/2022   Wound of left groin 09/10/2022   Pulmonary edema 08/16/2022   Abscess of anal and rectal regions 08/15/2022   Decubitus ulcer of sacral region, stage 2 ([place]) 08/15/2022   History of intracranial hemorrhage 08/04/2022   Atherosclerosis of native arteries of extremity with rest pain ([place])  04/02/2022   PAD (peripheral artery disease) ([place]) 04/11/2021   Ischemic leg pain 04/11/2021   Chronic mesenteric ischemia ([place]) 12/17/2020   Malnutrition of moderate degree 11/12/2020   Asymptomatic bacteriuria 11/10/2020   Abdominal pain 11/09/2020   Hypokalemia 11/09/2020   MDD (major depressive disorder), recurrent, in full remission ([place]) 01/16/2020   Anxiety disorder 01/16/2020   Hyperlipidemia 12/15/2019   Carotid stenosis 12/15/2019   MDD (major depressive disorder), recurrent, in partial remission ([place]) 08/24/2019   MDD (major depressive disorder), recurrent episode, mild ([place]) 04/08/2019   Insomnia due to mental condition 04/08/2019   Major neurocognitive disorder due to Alzheimer's disease, probable, without behavioral disturbance 04/08/2019   DDD (degenerative disc disease), cervical 12/19/2016   Frequent falls 12/10/2016   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   Nonrheumatic aortic valve stenosis 09/30/2016   (HFpEF) heart failure with preserved ejection fraction ([place]) 09/20/2016   COPD (chronic obstructive pulmonary disease) ([place]) 09/20/2016   Respiratory failure with hypoxia ([place]) 09/20/2016   NSTEMI (non-ST elevated myocardial infarction) ([place]) 08/21/2016   SOB (shortness of breath) 08/18/2016   Atherosclerosis of native arteries of extremity with intermittent claudication ([place]) 01/08/2016   Palpitations 07/10/2015   History of nonmelanoma skin cancer 03/16/2015   Pseudoaneurysm following procedure ([place]) 07/19/2014   Femoral artery occlusion, right ([place]) 06/28/2014   Claudication ([place]) 06/05/2014   Aortic insufficiency 03/20/2014   Coronary artery disease 03/20/2014   Chronic pain 03/20/2014   Memory loss 11/09/2012   Depressive disorder 08/02/2012   Neck pain 08/02/2012   Malaise and fatigue 08/02/2012   Hypertension, benign 08/02/2012   Dizziness 08/02/2012  Polyneuropathy in diabetes ([place]) 08/02/2012   Rheumatic fever with heart  involvement 08/02/2012   Tobacco use disorder 08/02/2012   Diabetes mellitus ([place]) 08/02/1990    Orientation RESPIRATION BLADDER Height & Weight     Self, Time, Situation, Place  Normal Incontinent, External catheter Weight: 110 lb 0.2 oz (49.9 kg) Height:     BEHAVIORAL SYMPTOMS/MOOD NEUROLOGICAL BOWEL NUTRITION STATUS      Continent Diet (see dc summary)  AMBULATORY STATUS COMMUNICATION OF NEEDS Skin   Total Care Verbally Skin abrasions, PU Stage and Appropriate Care                       Personal Care Assistance Level of Assistance  Bathing, Feeding, Dressing Bathing Assistance: Maximum assistance Feeding assistance: Limited assistance Dressing Assistance: Maximum assistance     Functional Limitations Info  Speech, Hearing, Sight Sight Info: Impaired Hearing Info: Impaired Speech Info: Adequate    SPECIAL CARE FACTORS FREQUENCY  PT (By licensed PT), OT (By licensed OT)     PT Frequency: 5x week OT Frequency: 5x week            Contractures Contractures Info: Not present    Additional Factors Info  Code Status, Allergies, Psychotropic, Insulin Sliding Scale Code Status Info: Full Allergies Info: Iodinated Contrast Media, Sulfa Antibiotics, Shellfish Allergy Psychotropic Info: Lexapro, Hydralazine Insulin Sliding Scale Info: see dc summary       Current Medications (10/25/2022):  This is the current hospital active medication list Current Facility-Administered Medications  Medication Dose Route Frequency Provider Last Rate Last Admin   acetaminophen (TYLENOL) tablet 1,000 mg  1,000 mg Oral Q8H Gonfa, Taye T, MD   1,000 mg at 10/25/22 1351   amLODipine (NORVASC) tablet 10 mg  10 mg Oral Daily Para Skeans, MD   10 mg at 10/25/22 0936   atorvastatin (LIPITOR) tablet 80 mg  80 mg Oral QHS Florina Ou V, MD   80 mg at 10/24/22 2047   carvedilol (COREG) tablet 6.25 mg  6.25 mg Oral BID WC Florina Ou V, MD   6.25 mg at 10/25/22 0935   Chlorhexidine Gluconate  Cloth 2 % PADS 6 each  6 each Topical Daily Wendee Beavers T, MD   6 each at 10/25/22 [id number]   diclofenac Sodium (VOLTAREN) 1 % topical gel 2 g  2 g Topical QID Wendee Beavers T, MD   2 g at 10/25/22 1204   enoxaparin (LOVENOX) injection 40 mg  40 mg Subcutaneous Q24H Gonfa, Taye T, MD   40 mg at 10/25/22 1351   escitalopram (LEXAPRO) tablet 10 mg  10 mg Oral Daily Florina Ou V, MD   10 mg at 10/25/22 0936   fluticasone furoate-vilanterol (BREO ELLIPTA) 200-25 MCG/ACT 1 puff  1 puff Inhalation Daily Florina Ou V, MD   1 puff at 10/25/22 0834   gabapentin (NEURONTIN) capsule 200 mg  200 mg Oral BID WC Madueme, Elvira C, RPH   200 mg at 10/25/22 1123   And   gabapentin (NEURONTIN) capsule 400 mg  400 mg Oral QHS Madueme, Elvira C, RPH   400 mg at 10/24/22 2045   hydrALAZINE (APRESOLINE) injection 5 mg  5 mg Intravenous Q4H PRN Para Skeans, MD       hydrALAZINE (APRESOLINE) tablet 50 mg  50 mg Oral TID Para Skeans, MD   50 mg at 10/25/22 0936   insulin aspart (novoLOG) injection 0-15 Units  0-15 Units Subcutaneous TID WC Gonfa,  Charlesetta Ivory, MD   5 Units at 10/25/22 1204   insulin detemir (LEVEMIR) injection 10 Units  10 Units Subcutaneous BID Para Skeans, MD   10 Units at 10/25/22 0939   levETIRAcetam (KEPPRA) tablet 500 mg  500 mg Oral BID Para Skeans, MD   500 mg at 10/25/22 [id number]   multivitamin with minerals tablet 1 tablet  1 tablet Oral Daily Para Skeans, MD   1 tablet at 10/25/22 0936   nicotine (NICODERM CQ - dosed in mg/24 hours) patch 14 mg  14 mg Transdermal Daily Florina Ou V, MD   14 mg at 10/25/22 [id number]   oxyCODONE (Oxy IR/ROXICODONE) immediate release tablet 5 mg  5 mg Oral Q6H PRN Wendee Beavers T, MD   5 mg at 10/25/22 1202   piperacillin-tazobactam (ZOSYN) IVPB 3.375 g  3.375 g Intravenous Q8H Patel, Esmeralda Links V, MD 12.5 mL/hr at 10/25/22 0930 3.375 g at 10/25/22 0930   polyethylene glycol (MIRALAX / GLYCOLAX) packet 17 g  17 g Oral BID PRN Mercy Riding, MD       potassium chloride (KLOR-CON)  packet 40 mEq  40 mEq Oral BID Oran Rein, MD   40 mEq at 10/25/22 0925   senna-docusate (Senokot-S) tablet 2 tablet  2 tablet Oral BID PRN Wendee Beavers T, MD       sodium chloride flush (NS) 0.9 % injection 3 mL  3 mL Intravenous Q12H Para Skeans, MD   3 mL at 10/25/22 [id number]     Discharge Medications: Please see discharge summary for a list of discharge medications.  Relevant Imaging Results:  Relevant Lab Results:   Additional Information SS#: [id number]. [facility] 1/29-2/3 and 2/6-2/7 and [facility] 2/12-2/23. Used about 17 days.  [place], LCSWA

## 2022-10-26 ENCOUNTER — Encounter: Payer: Self-pay | Admitting: Internal Medicine

## 2022-10-26 DIAGNOSIS — Y92009 Unspecified place in unspecified non-institutional (private) residence as the place of occurrence of the external cause: Secondary | ICD-10-CM | POA: Diagnosis not present

## 2022-10-26 DIAGNOSIS — W19XXXA Unspecified fall, initial encounter: Secondary | ICD-10-CM | POA: Diagnosis not present

## 2022-10-26 DIAGNOSIS — S31109D Unspecified open wound of abdominal wall, unspecified quadrant without penetration into peritoneal cavity, subsequent encounter: Secondary | ICD-10-CM | POA: Diagnosis not present

## 2022-10-26 LAB — GLUCOSE, CAPILLARY
Glucose-Capillary: 115 mg/dL — ABNORMAL HIGH (ref 70–99)
Glucose-Capillary: 164 mg/dL — ABNORMAL HIGH (ref 70–99)
Glucose-Capillary: 173 mg/dL — ABNORMAL HIGH (ref 70–99)
Glucose-Capillary: 192 mg/dL — ABNORMAL HIGH (ref 70–99)
Glucose-Capillary: 195 mg/dL — ABNORMAL HIGH (ref 70–99)
Glucose-Capillary: 198 mg/dL — ABNORMAL HIGH (ref 70–99)

## 2022-10-26 LAB — CBC
HCT: 32.3 % — ABNORMAL LOW (ref 36.0–46.0)
Hemoglobin: 10.1 g/dL — ABNORMAL LOW (ref 12.0–15.0)
MCH: 28.8 pg (ref 26.0–34.0)
MCHC: 31.3 g/dL (ref 30.0–36.0)
MCV: 92 fL (ref 80.0–100.0)
Platelets: 415 10*3/uL — ABNORMAL HIGH (ref 150–400)
RBC: 3.51 MIL/uL — ABNORMAL LOW (ref 3.87–5.11)
RDW: 16.8 % — ABNORMAL HIGH (ref 11.5–15.5)
WBC: 4.7 10*3/uL (ref 4.0–10.5)
nRBC: 0 % (ref 0.0–0.2)

## 2022-10-26 LAB — BASIC METABOLIC PANEL
Anion gap: 6 (ref 5–15)
BUN: 17 mg/dL (ref 8–23)
CO2: 25 mmol/L (ref 22–32)
Calcium: 8.8 mg/dL — ABNORMAL LOW (ref 8.9–10.3)
Chloride: 106 mmol/L (ref 98–111)
Creatinine, Ser: 0.78 mg/dL (ref 0.44–1.00)
GFR, Estimated: >60 mL/min (ref >60)
Glucose, Bld: 153 mg/dL — ABNORMAL HIGH (ref 70–99)
Potassium: 4.4 mmol/L (ref 3.5–5.1)
Sodium: 137 mmol/L (ref 135–145)

## 2022-10-26 MED ORDER — TRAZODONE HCL 50 MG PO TABS
25.0000 mg | ORAL_TABLET | Freq: Every evening | ORAL | Status: AC | PRN
  Administered 2022-10-26 – 2022-10-29 (×4): 25 mg via ORAL
  Filled 2022-10-26 (×4): qty 1

## 2022-10-26 NOTE — Progress Notes (Signed)
Progress Note    Heather [name]  [id number] DOB: 10-11-1950  DOA: 10/23/2022 PCP: [name], FNP      Brief Narrative:    Medical records reviewed and are as summarized below:  Heather [name] is a 72 y.o. female with PMH of CVA, ICH, BLE ischemia s/p bilateral FEA and left AKA in 07/2022, TAVR, CAD, DM-2, COPD, HTN, diastolic CHF, anxiety, seizure and recent hospitalization from 2/24-2/28 for bilateral inguinal wound infection for which she was discharged on IV Zosyn until 10/28/2022 followed by Cipro and Flagyl for another month, returning with acute back pain after accidental fall while transferring from recliner to wheelchair. Patient refused to go to SNF and discharged home with home health and DME last hospitalization. Left AMA from SNF before that. This is his fifth admission this year. Now she is agreeable to go to SNF.          Assessment/Plan:   Principal Problem:   Fall at home, initial encounter Active Problems:   Diabetes mellitus ([place])   Hypertension, benign   Coronary artery disease   COPD (chronic obstructive pulmonary disease) (HCC)   Tobacco use disorder   PAD (peripheral artery disease) (HCC)   History of intracranial hemorrhage   Chronic diastolic CHF (congestive heart failure) (HCC)   Generalized weakness   Open wound of inguinal region with complication   Hx of AKA (above knee amputation), left (HCC)   History of seizure   Right knee pain    Body mass index is 21.61 kg/m.   Accidental fall at home, acute right knee pain: Slipped and fell while transferring from recliner to wheelchair.  Denies hitting head or LOC.  Had acute back pain after fall.  Lumbar MRI without acute finding other than chronic degenerative changes.  Patient has left AKA.  Now with acute right knee pain.  Previously refused to go to rehab.  She is now agreeable. Analgesics as needed for pain.     PAD s/p bilateral FEA, left AKA and RLE fasciotomy in 07/2022.  She was  discharged on IV Zosyn for bilateral groin wound infection.  Continue IV Zosyn through 10/28/2022 followed by oral ciprofloxacin and Flagyl as previously planned. Continue Plavix.  Continue local wound care    Uncontrolled IDDM-2 with hypo and hyperglycemia: A1c 12.9% on 08/05/2022. Continue Levemir and NovoLog as needed for hyperglycemia   Seizure disorder Continue Keppra   Hypokalemia Improved    Other comorbidities include COPD, CAD, chronic diastolic CHF, hypertension history of CVA/intracranial hemorrhage in 07/2022, tobacco use disorder     Diet Order             Diet heart healthy/carb modified Room service appropriate? Yes; Fluid consistency: Thin  Diet effective now                            Consultants: None  Procedures: None    Medications:    acetaminophen  1,000 mg Oral Q8H   amLODipine  10 mg Oral Daily   atorvastatin  80 mg Oral QHS   carvedilol  6.25 mg Oral BID WC   Chlorhexidine Gluconate Cloth  6 each Topical Daily   diclofenac Sodium  2 g Topical QID   enoxaparin (LOVENOX) injection  40 mg Subcutaneous Q24H   escitalopram  10 mg Oral Daily   fluticasone furoate-vilanterol  1 puff Inhalation Daily   gabapentin  200 mg Oral BID WC   And  gabapentin  400 mg Oral QHS   hydrALAZINE  50 mg Oral TID   insulin aspart  0-15 Units Subcutaneous TID WC   insulin detemir  10 Units Subcutaneous BID   levETIRAcetam  500 mg Oral BID   multivitamin with minerals  1 tablet Oral Daily   nicotine  14 mg Transdermal Daily   potassium chloride  40 mEq Oral BID   sodium chloride flush  3 mL Intravenous Q12H   Continuous Infusions:  piperacillin-tazobactam 12.5 mL/hr at 10/26/22 0200     Anti-infectives (From admission, onward)    Start     Dose/Rate Route Frequency Ordered Stop   10/24/22 0045  piperacillin-tazobactam (ZOSYN) IVPB 3.375 g        3.375 g 12.5 mL/hr over 240 Minutes Intravenous Every 8 hours 10/23/22 2333                 Family Communication/Anticipated D/C date and plan/Code Status   DVT prophylaxis: enoxaparin (LOVENOX) injection 40 mg Start: 10/24/22 1400 SCDs Start: 10/23/22 2316     Code Status: Full Code  Family Communication: None Disposition Plan: Plan to discharge to SNF   Status is: Inpatient Remains inpatient appropriate because: Awaiting placement to SNF       Subjective:   Interval events noted.  She has no complaints.  Objective:    Vitals:   10/26/22 0004 10/26/22 0445 10/26/22 0500 10/26/22 0847  BP: (!) 122/49 (!) 129/48  (!) 124/50  Pulse: 75 (!) 59  62  Resp: '14 16  18  '$ Temp: 98.2 F (36.8 C) 97.6 F (36.4 C)  (!) 97.5 F (36.4 C)  TempSrc: Oral   Oral  SpO2: 93% 99%  95%  Weight:   50.2 kg    No data found.   Intake/Output Summary (Last 24 hours) at 10/26/2022 [id number] Last data filed at 10/26/2022 [id number] Gross per 24 hour  Intake 907.25 ml  Output 1250 ml  Net -342.75 ml   Filed Weights   10/23/22 2001 10/24/22 0500 10/26/22 0500  Weight: 50.3 kg 49.9 kg 50.2 kg    Exam:  GEN: NAD SKIN: B/l groin wounds EYES: EOMI ENT: MMM CV: RRR PULM: CTA B ABD: soft, ND, NT, +BS CNS: AAO x 3, non focal EXT: Left AKA    Pressure Injury 10/02/22 Ischial tuberosity Left Stage 2 -  Partial thickness loss of dermis presenting as a shallow open injury with a red, pink wound bed without slough. 1 cm x 2 cm x 0.1 cm (Active)  10/02/22 0800  Location: Ischial tuberosity  Location Orientation: Left  Staging: Stage 2 -  Partial thickness loss of dermis presenting as a shallow open injury with a red, pink wound bed without slough.  Wound Description (Comments): 1 cm x 2 cm x 0.1 cm  Present on Admission: Yes  Dressing Type Foam - Lift dressing to assess site every shift 10/25/22 2000     Pressure Injury 10/18/22 Sacrum Mid Stage 2 -  Partial thickness loss of dermis presenting as a shallow open injury with a red, pink wound bed without slough. pink  open area in center (Active)  10/18/22 0900  Location: Sacrum  Location Orientation: Mid  Staging: Stage 2 -  Partial thickness loss of dermis presenting as a shallow open injury with a red, pink wound bed without slough.  Wound Description (Comments): pink open area in center  Present on Admission: Yes  Dressing Type Foam - Lift dressing to assess site every shift  10/25/22 2000     Data Reviewed:   I have personally reviewed following labs and imaging studies:  Labs: Labs show the following:   Basic Metabolic Panel: Recent Labs  Lab 10/23/22 2043 10/24/22 0818 10/25/22 0539 10/26/22 0517  NA 137 140 141 137  K 3.4* 3.4* 3.2* 4.4  CL 101 103 107 106  CO2 '28 26 25 25  '$ GLUCOSE 156* 82 94 153*  BUN '12 9 12 17  '$ CREATININE 0.60 0.54 0.68 0.78  CALCIUM 9.3 9.3 8.9 8.8*  MG  --  1.7 1.8  --   PHOS  --  3.6 3.9  --    GFR Estimated Creatinine Clearance: 47.7 mL/min (by C-G formula based on SCr of 0.78 mg/dL). Liver Function Tests: Recent Labs  Lab 10/23/22 2043 10/24/22 0818 10/25/22 0539  AST 72*  --   --   ALT 165*  --   --   ALKPHOS 342*  --   --   BILITOT 0.5  --   --   PROT 6.6  --   --   ALBUMIN 2.8* 3.0* 2.5*   No results for input(s): "LIPASE", "AMYLASE" in the last 168 hours. No results for input(s): "AMMONIA" in the last 168 hours. Coagulation profile No results for input(s): "INR", "PROTIME" in the last 168 hours.  CBC: Recent Labs  Lab 10/23/22 2043 10/24/22 0818 10/25/22 0539 10/26/22 0517  WBC 10.9* 8.0 5.5 4.7  NEUTROABS 8.8*  --   --   --   HGB 10.5* 11.5* 9.5* 10.1*  HCT 33.8* 36.2 29.9* 32.3*  MCV 91.8 90.5 90.6 92.0  PLT 449* 487* 419* 415*   Cardiac Enzymes: Recent Labs  Lab 10/24/22 1000  CKTOTAL 28*   BNP (last 3 results) No results for input(s): "PROBNP" in the last 8760 hours. CBG: Recent Labs  Lab 10/25/22 1508 10/25/22 2001 10/26/22 0014 10/26/22 0443 10/26/22 0847  GLUCAP 132* 159* 173* 164* 115*   D-Dimer: No  results for input(s): "DDIMER" in the last 72 hours. Hgb A1c: No results for input(s): "HGBA1C" in the last 72 hours. Lipid Profile: No results for input(s): "CHOL", "HDL", "LDLCALC", "TRIG", "CHOLHDL", "LDLDIRECT" in the last 72 hours. Thyroid function studies: No results for input(s): "TSH", "T4TOTAL", "T3FREE", "THYROIDAB" in the last 72 hours.  Invalid input(s): "FREET3" Anemia work up: No results for input(s): "VITAMINB12", "FOLATE", "FERRITIN", "TIBC", "IRON", "RETICCTPCT" in the last 72 hours. Sepsis Labs: Recent Labs  Lab 10/23/22 2043 10/24/22 0818 10/25/22 0539 10/26/22 0517  WBC 10.9* 8.0 5.5 4.7    Microbiology No results found for this or any previous visit (from the past 240 hour(s)).  Procedures and diagnostic studies:  DG Knee 1-2 Views Right  Result Date: 10/24/2022 CLINICAL DATA:  Right knee pain after fall last night. EXAM: RIGHT KNEE - 1-2 VIEW COMPARISON:  None Available. FINDINGS: No evidence of fracture, dislocation, or joint effusion. No evidence of arthropathy or other focal bone abnormality. Vascular calcifications are noted as well as stent in superficial femoral artery. IMPRESSION: No acute abnormality seen. Electronically Signed   By: Marijo Conception M.D.   On: 10/24/2022 09:50               LOS: 3 days   Shaneil Yazdi  [facility]   Pager on [email or web address]. If 7PM-7AM, please contact night-coverage at [email or web address]     10/26/2022, 9:07 AM

## 2022-10-26 NOTE — Progress Notes (Signed)
       CROSS COVER NOTE  NAME: Heather [name] MRN: [id number] DOB : [birth year] ATTENDING PHYSICIAN: Jennye Boroughs, MD    Date of Service   10/26/2022   HPI/Events of Note   Report *** Request for sleep aid On Review of chart *** Bedside eval*** HPI***  Interventions   Assessment/Plan: Home Trazodone X X    *** professional thanks      To reach the provider On-Call:   7AM- 7PM see care teams to locate the attending and reach out to them via [email or web address] Password: [email or web address] 7PM-7AM contact night-coverage If you still have difficulty reaching the appropriate provider, please page the [facility] (Director on Call) for [facility] on amion for assistance  This document was prepared using Systems analyst and may include unintentional dictation errors.  Neomia Glass DNP, MBA, FNP-BC, PMHNP-BC Nurse Practitioner [facility] Pager [phone]

## 2022-10-27 ENCOUNTER — Ambulatory Visit (HOSPITAL_COMMUNITY): Payer: Medicare HMO

## 2022-10-27 DIAGNOSIS — Y92009 Unspecified place in unspecified non-institutional (private) residence as the place of occurrence of the external cause: Secondary | ICD-10-CM | POA: Diagnosis not present

## 2022-10-27 DIAGNOSIS — W19XXXA Unspecified fall, initial encounter: Secondary | ICD-10-CM | POA: Diagnosis not present

## 2022-10-27 DIAGNOSIS — S31109D Unspecified open wound of abdominal wall, unspecified quadrant without penetration into peritoneal cavity, subsequent encounter: Secondary | ICD-10-CM | POA: Diagnosis not present

## 2022-10-27 DIAGNOSIS — I739 Peripheral vascular disease, unspecified: Secondary | ICD-10-CM | POA: Diagnosis not present

## 2022-10-27 LAB — GLUCOSE, CAPILLARY
Glucose-Capillary: 122 mg/dL — ABNORMAL HIGH (ref 70–99)
Glucose-Capillary: 217 mg/dL — ABNORMAL HIGH (ref 70–99)
Glucose-Capillary: 149 mg/dL — ABNORMAL HIGH (ref 70–99)
Glucose-Capillary: 273 mg/dL — ABNORMAL HIGH (ref 70–99)

## 2022-10-27 MED ORDER — LOPERAMIDE HCL 2 MG PO CAPS
2.0000 mg | ORAL_CAPSULE | Freq: Four times a day (QID) | ORAL | Status: DC | PRN
  Administered 2022-10-27 – 2022-10-28 (×3): 2 mg via ORAL
  Filled 2022-10-27 (×3): qty 1

## 2022-10-27 MED ORDER — ORAL CARE MOUTH RINSE: 15.0000 mL | OROMUCOSAL | Status: DC | PRN

## 2022-10-27 NOTE — Progress Notes (Addendum)
Progress Note    Heather [name]  [id number] DOB: [birth year]  DOA: 10/23/2022 PCP: [name], FNP      Brief Narrative:    Medical records reviewed and are as summarized below:  Heather [name] is a 72 y.o. female with PMH of CVA, ICH, BLE ischemia s/p bilateral FEA and left AKA in 07/2022, TAVR, CAD, DM-2, COPD, HTN, diastolic CHF, anxiety, seizure and recent hospitalization from 2/24-2/28 for bilateral inguinal wound infection for which she was discharged on IV Zosyn until 10/28/2022 followed by Cipro and Flagyl for another month, returning with acute back pain after accidental fall while transferring from recliner to wheelchair. Patient refused to go to SNF and discharged home with home health and DME last hospitalization. Left AMA from SNF before that. This is his fifth admission this year. Now she is agreeable to go to SNF.          Assessment/Plan:   Principal Problem:   Fall at home, initial encounter Active Problems:   Diabetes mellitus ([place])   Hypertension, benign   Coronary artery disease   COPD (chronic obstructive pulmonary disease) (HCC)   Tobacco use disorder   PAD (peripheral artery disease) (HCC)   History of intracranial hemorrhage   Chronic diastolic CHF (congestive heart failure) (HCC)   Generalized weakness   Open wound of inguinal region with complication   Hx of AKA (above knee amputation), left (HCC)   History of seizure   Right knee pain    Body mass index is 21.61 kg/m.   Accidental fall at home, acute right knee pain: Slipped and fell while transferring from recliner to wheelchair.  Denies hitting head or LOC.  Had acute back pain after fall.  Lumbar MRI without acute finding other than chronic degenerative changes.  Patient has left AKA.  Now with acute right knee pain.  Previously refused to go to rehab.  She is now agreeable. Analgesics as needed for pain.     PAD s/p bilateral FEA, left AKA and RLE fasciotomy in 07/2022.  She was  discharged on IV Zosyn for bilateral groin wound infection.  Continue IV Zosyn through 10/28/2022 followed by oral ciprofloxacin and Flagyl as previously planned.  Continue current antibiotics. Continue Plavix.  Continue local wound care    Uncontrolled IDDM-2 with hypo and hyperglycemia: A1c 12.9% on 08/05/2022. Continue Levemir and NovoLog as needed for hyperglycemia.  Glucose levels are okay.  No   Seizure disorder Continue Keppra   Hypokalemia Improved    Other comorbidities include COPD, CAD, chronic diastolic CHF, hypertension history of CVA/intracranial hemorrhage in 07/2022, tobacco use disorder   Transfer from progressive cardiac unit to MedSurg  Diet Order             Diet heart healthy/carb modified Room service appropriate? Yes; Fluid consistency: Thin  Diet effective now                            Consultants: None  Procedures: None    Medications:    acetaminophen  1,000 mg Oral Q8H   amLODipine  10 mg Oral Daily   atorvastatin  80 mg Oral QHS   carvedilol  6.25 mg Oral BID WC   Chlorhexidine Gluconate Cloth  6 each Topical Daily   diclofenac Sodium  2 g Topical QID   enoxaparin (LOVENOX) injection  40 mg Subcutaneous Q24H   escitalopram  10 mg Oral Daily   fluticasone furoate-vilanterol  1 puff Inhalation Daily   gabapentin  200 mg Oral BID WC   And   gabapentin  400 mg Oral QHS   hydrALAZINE  50 mg Oral TID   insulin aspart  0-15 Units Subcutaneous TID WC   insulin detemir  10 Units Subcutaneous BID   levETIRAcetam  500 mg Oral BID   multivitamin with minerals  1 tablet Oral Daily   nicotine  14 mg Transdermal Daily   sodium chloride flush  3 mL Intravenous Q12H   Continuous Infusions:  piperacillin-tazobactam 3.375 g (10/27/22 0843)     Anti-infectives (From admission, onward)    Start     Dose/Rate Route Frequency Ordered Stop   10/24/22 0045  piperacillin-tazobactam (ZOSYN) IVPB 3.375 g        3.375 g 12.5 mL/hr over  240 Minutes Intravenous Every 8 hours 10/23/22 2333                Family Communication/Anticipated D/C date and plan/Code Status   DVT prophylaxis: enoxaparin (LOVENOX) injection 40 mg Start: 10/24/22 1400 SCDs Start: 10/23/22 2316     Code Status: Full Code  Family Communication: None Disposition Plan: Plan to discharge to SNF   Status is: Inpatient Remains inpatient appropriate because: Awaiting placement to SNF       Subjective:   Interval events noted.  No chest pain or shortness of breath.  She has no complaints.  She is just waiting for placement.  Objective:    Vitals:   10/26/22 2350 10/27/22 0755 10/27/22 0800 10/27/22 1135  BP: 134/69 (!) 109/40 (!) 153/69 (!) 128/57  Pulse: 79 68 62 71  Resp: '16 18 20 17  '$ Temp: 98.3 F (36.8 C) (!) 97 F (36.1 C) 97.6 F (36.4 C) 97.9 F (36.6 C)  TempSrc: Oral     SpO2: 97% 96% 99% 99%  Weight:       No data found.   Intake/Output Summary (Last 24 hours) at 10/27/2022 1315 Last data filed at 10/27/2022 1200 Gross per 24 hour  Intake 380 ml  Output 475 ml  Net -95 ml   Filed Weights   10/23/22 2001 10/24/22 0500 10/26/22 0500  Weight: 50.3 kg 49.9 kg 50.2 kg    Exam:  GEN: No acute distress SKIN: B/l groin wounds EYES: EOMI ENT: MMM CV: RRR PULM: CTA B ABD: soft, ND, NT, +BS CNS: AAO x 3, non focal EXT: Left AKA.  Mild right knee tenderness without erythema or swelling   Pressure Injury 10/02/22 Ischial tuberosity Left Stage 2 -  Partial thickness loss of dermis presenting as a shallow open injury with a red, pink wound bed without slough. 1 cm x 2 cm x 0.1 cm (Active)  10/02/22 0800  Location: Ischial tuberosity  Location Orientation: Left  Staging: Stage 2 -  Partial thickness loss of dermis presenting as a shallow open injury with a red, pink wound bed without slough.  Wound Description (Comments): 1 cm x 2 cm x 0.1 cm  Present on Admission: Yes  Dressing Type Foam - Lift dressing to  assess site every shift 10/25/22 2000     Pressure Injury 10/18/22 Sacrum Mid Stage 2 -  Partial thickness loss of dermis presenting as a shallow open injury with a red, pink wound bed without slough. pink open area in center (Active)  10/18/22 0900  Location: Sacrum  Location Orientation: Mid  Staging: Stage 2 -  Partial thickness loss of dermis presenting as a shallow open injury  with a red, pink wound bed without slough.  Wound Description (Comments): pink open area in center  Present on Admission: Yes  Dressing Type Foam - Lift dressing to assess site every shift 10/27/22 0800     Data Reviewed:   I have personally reviewed following labs and imaging studies:  Labs: Labs show the following:   Basic Metabolic Panel: Recent Labs  Lab 10/23/22 2043 10/24/22 0818 10/25/22 0539 10/26/22 0517  NA 137 140 141 137  K 3.4* 3.4* 3.2* 4.4  CL 101 103 107 106  CO2 '28 26 25 25  '$ GLUCOSE 156* 82 94 153*  BUN '12 9 12 17  '$ CREATININE 0.60 0.54 0.68 0.78  CALCIUM 9.3 9.3 8.9 8.8*  MG  --  1.7 1.8  --   PHOS  --  3.6 3.9  --    GFR Estimated Creatinine Clearance: 47.7 mL/min (by C-G formula based on SCr of 0.78 mg/dL). Liver Function Tests: Recent Labs  Lab 10/23/22 2043 10/24/22 0818 10/25/22 0539  AST 72*  --   --   ALT 165*  --   --   ALKPHOS 342*  --   --   BILITOT 0.5  --   --   PROT 6.6  --   --   ALBUMIN 2.8* 3.0* 2.5*   No results for input(s): "LIPASE", "AMYLASE" in the last 168 hours. No results for input(s): "AMMONIA" in the last 168 hours. Coagulation profile No results for input(s): "INR", "PROTIME" in the last 168 hours.  CBC: Recent Labs  Lab 10/23/22 2043 10/24/22 0818 10/25/22 0539 10/26/22 0517  WBC 10.9* 8.0 5.5 4.7  NEUTROABS 8.8*  --   --   --   HGB 10.5* 11.5* 9.5* 10.1*  HCT 33.8* 36.2 29.9* 32.3*  MCV 91.8 90.5 90.6 92.0  PLT 449* 487* 419* 415*   Cardiac Enzymes: Recent Labs  Lab 10/24/22 1000  CKTOTAL 28*   BNP (last 3  results) No results for input(s): "PROBNP" in the last 8760 hours. CBG: Recent Labs  Lab 10/26/22 1214 10/26/22 1721 10/26/22 2022 10/27/22 0756 10/27/22 1136  GLUCAP 192* 195* 198* 122* 217*   D-Dimer: No results for input(s): "DDIMER" in the last 72 hours. Hgb A1c: No results for input(s): "HGBA1C" in the last 72 hours. Lipid Profile: No results for input(s): "CHOL", "HDL", "LDLCALC", "TRIG", "CHOLHDL", "LDLDIRECT" in the last 72 hours. Thyroid function studies: No results for input(s): "TSH", "T4TOTAL", "T3FREE", "THYROIDAB" in the last 72 hours.  Invalid input(s): "FREET3" Anemia work up: No results for input(s): "VITAMINB12", "FOLATE", "FERRITIN", "TIBC", "IRON", "RETICCTPCT" in the last 72 hours. Sepsis Labs: Recent Labs  Lab 10/23/22 2043 10/24/22 0818 10/25/22 0539 10/26/22 0517  WBC 10.9* 8.0 5.5 4.7    Microbiology No results found for this or any previous visit (from the past 240 hour(s)).  Procedures and diagnostic studies:  No results found.             LOS: 4 days   Heather [name]  [facility]   Pager on [email or web address]. If 7PM-7AM, please contact night-coverage at [email or web address]     10/27/2022, 1:15 PM

## 2022-10-27 NOTE — Care Management Important Message (Signed)
Important Message  Patient Details  Name: Heather [name] MRN: [id number] Date of Birth: 1950-09-15   Medicare Important Message Given:  Yes     Loann Quill 10/27/2022, 4:27 PM

## 2022-10-27 NOTE — Progress Notes (Signed)
Physical Therapy Treatment Patient Details Name: Heather [name] MRN: [id number] DOB: 09-27-1950 Today's Date: 10/27/2022   History of Present Illness Heather [name] is an 72 y.o. female seen in the emergency room today after a fall and has complaints of back pain.  She was transferring from the recliner to wheelchair when she accidentally slid off to the floor and fell on her bottom and her lower back started to spasm.    PT Comments    Pt is making gradual progress towards goals. Pt able to transfer bed->chair via SB and +2 max assist. Pt very weak on R LE and was able to perform standing with max A of 1 for pillow placement under buttocks secondary to pain. Will continue to progress as able.  Recommendations for follow up therapy are one component of a multi-disciplinary discharge planning process, led by the attending physician.  Recommendations may be updated based on patient status, additional functional criteria and insurance authorization.  Follow Up Recommendations  Skilled nursing-short term rehab (<3 hours/day) Can patient physically be transported by private vehicle: No   Assistance Recommended at Discharge Frequent or constant Supervision/Assistance  Patient can return home with the following Two people to help with walking and/or transfers;Two people to help with bathing/dressing/bathroom;Help with stairs or ramp for entrance;Assist for transportation;Assistance with cooking/housework   Equipment Recommendations   (TBD at next venue of care)    Recommendations for Other Services       Precautions / Restrictions Precautions Precautions: Fall Restrictions Weight Bearing Restrictions: Yes RLE Weight Bearing: Weight bearing as tolerated LLE Weight Bearing: Non weight bearing Other Position/Activity Restrictions: s/p L AKA 12/23     Mobility  Bed Mobility Overal bed mobility: Needs Assistance Bed Mobility: Supine to Sit, Rolling     Supine to sit: Max assist     General  bed mobility comments: once seated at EOB, post leaning and noted dizziness. Pt then able to progress to sitting at EOB with supervision. Assist for scooting out towards EOB and let R foot touch floor.    Transfers Overall transfer level: Needs assistance Equipment used: Sliding board Transfers: Bed to chair/wheelchair/BSC            Lateral/Scoot Transfers: Max assist, With slide board, +2 physical assistance General transfer comment: needs assist for hand placement and additional person for equipment support. Once seated in recliner, pt then able to stand with max A of 1 for pillow repositioning in chair. Hoyer sling placed under pt for convienence for transfers    Ambulation/Gait               General Gait Details: unsafe/unable   Marine scientist Rankin (Stroke Patients Only)       Balance Overall balance assessment: Needs assistance Sitting-balance support: Feet supported, Bilateral upper extremity supported Sitting balance-Leahy Scale: Fair     Standing balance support: During functional activity Standing balance-Leahy Scale: Zero                              Cognition Arousal/Alertness: Awake/alert Behavior During Therapy: WFL for tasks assessed/performed Overall Cognitive Status: Within Functional Limits for tasks assessed                                 General Comments: agreeable to session  Exercises      General Comments        Pertinent Vitals/Pain Pain Assessment Pain Assessment: Faces Faces Pain Scale: Hurts little more Pain Location: buttocks and back pain Pain Descriptors / Indicators: Discomfort, Grimacing, Guarding, Tender Pain Intervention(s): Limited activity within patient's tolerance    Home Living                          Prior Function            PT Goals (current goals can now be found in the care plan section) Acute Rehab PT  Goals Patient Stated Goal: improve her pain PT Goal Formulation: With patient Time For Goal Achievement: 11/07/22 Potential to Achieve Goals: Fair Progress towards PT goals: Progressing toward goals    Frequency    Min 2X/week      PT Plan Current plan remains appropriate    Co-evaluation              AM-PAC PT "6 Clicks" Mobility   Outcome Measure  Help needed turning from your back to your side while in a flat bed without using bedrails?: A Lot Help needed moving from lying on your back to sitting on the side of a flat bed without using bedrails?: A Lot Help needed moving to and from a bed to a chair (including a wheelchair)?: A Lot Help needed standing up from a chair using your arms (e.g., wheelchair or bedside chair)?: Total Help needed to walk in hospital room?: Total Help needed climbing 3-5 steps with a railing? : Total 6 Click Score: 9    End of Session   Activity Tolerance: Patient limited by fatigue;Patient limited by pain Patient left: in chair;with call bell/phone within reach;with chair alarm set Nurse Communication: Mobility status PT Visit Diagnosis: Muscle weakness (generalized) (M62.81);Other abnormalities of gait and mobility (R26.89) Pain - part of body:  (buttocks)     Time: [id number] PT Time Calculation (min) (ACUTE ONLY): 19 min  Charges:  $Therapeutic Activity: 8-22 mins                     Greggory Stallion, PT, DPT, GCS [phone]    Heather [name] 10/27/2022, 2:37 PM

## 2022-10-28 ENCOUNTER — Ambulatory Visit: Payer: Medicare HMO | Admitting: Infectious Diseases

## 2022-10-28 ENCOUNTER — Inpatient Hospital Stay: Payer: Medicare HMO | Admitting: Neurology

## 2022-10-28 DIAGNOSIS — W19XXXA Unspecified fall, initial encounter: Secondary | ICD-10-CM | POA: Diagnosis not present

## 2022-10-28 DIAGNOSIS — Y92009 Unspecified place in unspecified non-institutional (private) residence as the place of occurrence of the external cause: Secondary | ICD-10-CM | POA: Diagnosis not present

## 2022-10-28 DIAGNOSIS — E1159 Type 2 diabetes mellitus with other circulatory complications: Secondary | ICD-10-CM | POA: Diagnosis not present

## 2022-10-28 DIAGNOSIS — I10 Essential (primary) hypertension: Secondary | ICD-10-CM | POA: Diagnosis not present

## 2022-10-28 LAB — GLUCOSE, CAPILLARY
Glucose-Capillary: 163 mg/dL — ABNORMAL HIGH (ref 70–99)
Glucose-Capillary: 216 mg/dL — ABNORMAL HIGH (ref 70–99)
Glucose-Capillary: 275 mg/dL — ABNORMAL HIGH (ref 70–99)
Glucose-Capillary: 161 mg/dL — ABNORMAL HIGH (ref 70–99)

## 2022-10-28 MED ORDER — CIPROFLOXACIN HCL 500 MG PO TABS
500.0000 mg | ORAL_TABLET | Freq: Two times a day (BID) | ORAL | Status: DC
  Administered 2022-10-29 – 2022-10-31 (×5): 500 mg via ORAL
  Filled 2022-10-28 (×5): qty 1

## 2022-10-28 MED ORDER — METRONIDAZOLE 500 MG PO TABS
500.0000 mg | ORAL_TABLET | Freq: Two times a day (BID) | ORAL | Status: DC
  Administered 2022-10-29 – 2022-10-31 (×5): 500 mg via ORAL
  Filled 2022-10-28 (×5): qty 1

## 2022-10-28 NOTE — Progress Notes (Addendum)
Progress Note    Heather [name]  [id number] DOB: 1951/08/24  DOA: 10/23/2022 PCP: [name], FNP      Brief Narrative:    Medical records reviewed and are as summarized below:  Heather [name] is a 72 y.o. female with PMH of CVA, ICH, BLE ischemia s/p bilateral FEA and left AKA in 07/2022, TAVR, CAD, DM-2, COPD, HTN, diastolic CHF, anxiety, seizure and recent hospitalization from 2/24-2/28 for bilateral inguinal wound infection for which she was discharged on IV Zosyn until 10/28/2022 followed by Cipro and Flagyl for another month, returning with acute back pain after accidental fall while transferring from recliner to wheelchair. Patient refused to go to SNF and discharged home with home health and DME last hospitalization. Left AMA from SNF before that. This is his fifth admission this year. Now she is agreeable to go to SNF.          Assessment/Plan:   Principal Problem:   Fall at home, initial encounter Active Problems:   Diabetes mellitus ([place])   Hypertension, benign   Coronary artery disease   COPD (chronic obstructive pulmonary disease) ([facility])   Tobacco use disorder   PAD (peripheral artery disease) ([facility])   History of intracranial hemorrhage   Chronic diastolic CHF (congestive heart failure) (H[facility])   Generalized weakness   Open wound of inguinal region with complication   Hx of AKA (above knee amputation), left (HCC)   History of seizure   Right knee pain    Body mass index is 22.43 kg/m.   Accidental fall at home, acute right knee pain: Slipped and fell while transferring from recliner to wheelchair.  Denies hitting head or LOC.  Had acute back pain after fall.  Lumbar MRI without acute finding other than chronic degenerative changes.  Patient has left AKA.  Now with acute right knee pain.  Previously refused to go to rehab.  She is now agreeable. Analgesics as needed for pain.     PAD s/p bilateral FEA, left AKA and RLE fasciotomy in 07/2022,  bilateral groin debridement on 09/16/2022.   She was discharged on IV Zosyn for bilateral groin wound infection.  Completed IV Zosyn.  Start ciprofloxacin and Flagyl for suppressive therapy as previously planned (Dr Levonne Spiller note from 10/20/2022).  Continue Plavix.  Continue local wound care    Uncontrolled IDDM-2 with hypo and hyperglycemia: A1c 12.9% on 08/05/2022. Continue Levemir.  Use NovoLog as needed for hyperglycemia.   Seizure disorder Continue Keppra   Hypokalemia Improved    Other comorbidities include COPD, CAD, chronic diastolic CHF, hypertension history of CVA/intracranial hemorrhage in 07/2022, tobacco use disorder    Diet Order             Diet heart healthy/carb modified Room service appropriate? Yes; Fluid consistency: Thin  Diet effective now                            Consultants: None  Procedures: None    Medications:    acetaminophen  1,000 mg Oral Q8H   amLODipine  10 mg Oral Daily   atorvastatin  80 mg Oral QHS   carvedilol  6.25 mg Oral BID WC   Chlorhexidine Gluconate Cloth  6 each Topical Daily   [START ON 10/29/2022] ciprofloxacin  500 mg Oral BID   diclofenac Sodium  2 g Topical QID   enoxaparin (LOVENOX) injection  40 mg Subcutaneous Q24H   escitalopram  10 mg Oral  Daily   fluticasone furoate-vilanterol  1 puff Inhalation Daily   gabapentin  200 mg Oral BID WC   And   gabapentin  400 mg Oral QHS   hydrALAZINE  50 mg Oral TID   insulin aspart  0-15 Units Subcutaneous TID WC   insulin detemir  10 Units Subcutaneous BID   levETIRAcetam  500 mg Oral BID   [START ON 10/29/2022] metroNIDAZOLE  500 mg Oral Q12H   multivitamin with minerals  1 tablet Oral Daily   nicotine  14 mg Transdermal Daily   sodium chloride flush  3 mL Intravenous Q12H   Continuous Infusions:  piperacillin-tazobactam 3.375 g (10/28/22 0859)     Anti-infectives (From admission, onward)    Start     Dose/Rate Route Frequency Ordered Stop    10/29/22 1000  metroNIDAZOLE (FLAGYL) tablet 500 mg        500 mg Oral Every 12 hours 10/28/22 1313     10/29/22 0800  ciprofloxacin (CIPRO) tablet 500 mg        500 mg Oral 2 times daily 10/28/22 1313     10/24/22 0045  piperacillin-tazobactam (ZOSYN) IVPB 3.375 g        3.375 g 12.5 mL/hr over 240 Minutes Intravenous Every 8 hours 10/23/22 2333 10/28/22 2359              Family Communication/Anticipated D/C date and plan/Code Status   DVT prophylaxis: enoxaparin (LOVENOX) injection 40 mg Start: 10/24/22 1400 SCDs Start: 10/23/22 2316     Code Status: Full Code  Family Communication: None Disposition Plan: Plan to discharge to SNF   Status is: Inpatient Remains inpatient appropriate because: Awaiting placement to SNF       Subjective:   No complaints.  She feels well.  She said she is just waiting for a bed at SNF  Objective:    Vitals:   10/27/22 2107 10/28/22 0500 10/28/22 0519 10/28/22 0840  BP: (!) 132/59  (!) 122/49 116/61  Pulse: 87  70 79  Resp: '20  16 18  '$ Temp: 98.7 F (37.1 C)  98.5 F (36.9 C) 98.1 F (36.7 C)  TempSrc: Oral     SpO2: 95%  95% 90%  Weight:  52.1 kg     No data found.   Intake/Output Summary (Last 24 hours) at 10/28/2022 1403 Last data filed at 10/28/2022 1051 Gross per 24 hour  Intake 480 ml  Output 650 ml  Net -170 ml   Filed Weights   10/24/22 0500 10/26/22 0500 10/28/22 0500  Weight: 49.9 kg 50.2 kg 52.1 kg    Exam:   GEN: NAD SKIN: Warm and dry EYES: EOMI ENT: MMM CV: RRR PULM: CTA B ABD: soft, ND, NT, +BS CNS: AAO x 3, non focal EXT: No edema or tenderness.  Left AKA     Data Reviewed:   I have personally reviewed following labs and imaging studies:  Labs: Labs show the following:   Basic Metabolic Panel: Recent Labs  Lab 10/23/22 2043 10/24/22 0818 10/25/22 0539 10/26/22 0517  NA 137 140 141 137  K 3.4* 3.4* 3.2* 4.4  CL 101 103 107 106  CO2 '28 26 25 25  '$ GLUCOSE 156* 82 94 153*   BUN '12 9 12 17  '$ CREATININE 0.60 0.54 0.68 0.78  CALCIUM 9.3 9.3 8.9 8.8*  MG  --  1.7 1.8  --   PHOS  --  3.6 3.9  --    GFR Estimated Creatinine Clearance: 47.7  mL/min (by C-G formula based on SCr of 0.78 mg/dL). Liver Function Tests: Recent Labs  Lab 10/23/22 2043 10/24/22 0818 10/25/22 0539  AST 72*  --   --   ALT 165*  --   --   ALKPHOS 342*  --   --   BILITOT 0.5  --   --   PROT 6.6  --   --   ALBUMIN 2.8* 3.0* 2.5*   No results for input(s): "LIPASE", "AMYLASE" in the last 168 hours. No results for input(s): "AMMONIA" in the last 168 hours. Coagulation profile No results for input(s): "INR", "PROTIME" in the last 168 hours.  CBC: Recent Labs  Lab 10/23/22 2043 10/24/22 0818 10/25/22 0539 10/26/22 0517  WBC 10.9* 8.0 5.5 4.7  NEUTROABS 8.8*  --   --   --   HGB 10.5* 11.5* 9.5* 10.1*  HCT 33.8* 36.2 29.9* 32.3*  MCV 91.8 90.5 90.6 92.0  PLT 449* 487* 419* 415*   Cardiac Enzymes: Recent Labs  Lab 10/24/22 1000  CKTOTAL 28*   BNP (last 3 results) No results for input(s): "PROBNP" in the last 8760 hours. CBG: Recent Labs  Lab 10/27/22 1136 10/27/22 1606 10/27/22 2125 10/28/22 0848 10/28/22 1157  GLUCAP 217* 149* 273* 163* 216*   D-Dimer: No results for input(s): "DDIMER" in the last 72 hours. Hgb A1c: No results for input(s): "HGBA1C" in the last 72 hours. Lipid Profile: No results for input(s): "CHOL", "HDL", "LDLCALC", "TRIG", "CHOLHDL", "LDLDIRECT" in the last 72 hours. Thyroid function studies: No results for input(s): "TSH", "T4TOTAL", "T3FREE", "THYROIDAB" in the last 72 hours.  Invalid input(s): "FREET3" Anemia work up: No results for input(s): "VITAMINB12", "FOLATE", "FERRITIN", "TIBC", "IRON", "RETICCTPCT" in the last 72 hours. Sepsis Labs: Recent Labs  Lab 10/23/22 2043 10/24/22 0818 10/25/22 0539 10/26/22 0517  WBC 10.9* 8.0 5.5 4.7    Microbiology No results found for this or any previous visit (from the past 240  hour(s)).  Procedures and diagnostic studies:  No results found.             LOS: 5 days   Kristofor Michalowski  [facility]   Pager on [email or web address]. If 7PM-7AM, please contact night-coverage at [email or web address]     10/28/2022, 2:03 PM

## 2022-10-28 NOTE — Progress Notes (Signed)
Physical Therapy Treatment Patient Details Name: Heather [name] MRN: [id number] DOB: 05/17/1951 Today's Date: 10/28/2022   History of Present Illness Heather [name] is an 72 y.o. female seen in the emergency room today after a fall and has complaints of back pain.  She was transferring from the recliner to wheelchair when she accidentally slid off to the floor and fell on her bottom and her lower back started to spasm.    PT Comments    Pt is making gradual progress towards goals. Upon arrival, pt soiled in bed, once therapist attempted to change sheets, pt needed bed pan for imminent BM. Returned back and assisted in pericare and hygiene in addition to changing bed linen. Pt able to roll to B sides with assistance. Does report low back pain and has difficulty maintaining flat position. At end of session, pt still needed to use bed pan, returned to bed pan and ended session. Will continue to progress as able.  Recommendations for follow up therapy are one component of a multi-disciplinary discharge planning process, led by the attending physician.  Recommendations may be updated based on patient status, additional functional criteria and insurance authorization.  Follow Up Recommendations  Skilled nursing-short term rehab (<3 hours/day) Can patient physically be transported by private vehicle: No   Assistance Recommended at Discharge Frequent or constant Supervision/Assistance  Patient can return home with the following Two people to help with walking and/or transfers;Two people to help with bathing/dressing/bathroom;Help with stairs or ramp for entrance;Assist for transportation;Assistance with cooking/housework   Equipment Recommendations       Recommendations for Other Services       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 Restrictions Weight Bearing Restrictions: Yes RLE Weight Bearing: Weight bearing as  tolerated LLE Weight Bearing: Non weight bearing Other Position/Activity Restrictions: s/p L AKA 12/23     Mobility  Bed Mobility Overal bed mobility: Needs Assistance Bed Mobility: Rolling Rolling: Min assist         General bed mobility comments: Able to roll to B sides for linen change and hygiene. Needs slight assist for R LE.    Transfers                   General transfer comment: deferred as pt session broken up into two sessions and left on bed pan each one due to incontience    Ambulation/Gait                   Stairs             Wheelchair Mobility    Modified Rankin (Stroke Patients Only)       Balance Overall balance assessment: Needs assistance                                          Cognition Arousal/Alertness: Awake/alert Behavior During Therapy: WFL for tasks assessed/performed Overall Cognitive Status: Within Functional Limits for tasks assessed                                 General Comments: pleasant and agreeable to session        Exercises Other Exercises Other Exercises: pt soiled in bed upon arrival. Attempted to change linen while in bed, however pt then needs to  use bed pan. Left on bed pan and then performed more bed mobility for hygiene and pt began having BM and needed to use bed pan again. Linen changed and left on bed pan, RN notified. Needed total assist for all hygiene and bed linen replacement    General Comments        Pertinent Vitals/Pain Pain Assessment Pain Assessment: Faces Faces Pain Scale: Hurts little more Pain Location: buttocks and back pain Pain Descriptors / Indicators: Discomfort, Grimacing, Guarding, Tender Pain Intervention(s): Limited activity within patient's tolerance, Repositioned    Home Living                          Prior Function            PT Goals (current goals can now be found in the care plan section) Acute Rehab PT  Goals Patient Stated Goal: improve her pain PT Goal Formulation: With patient Time For Goal Achievement: 11/07/22 Potential to Achieve Goals: Fair Progress towards PT goals: Progressing toward goals    Frequency    Min 2X/week      PT Plan Current plan remains appropriate    Co-evaluation              AM-PAC PT "6 Clicks" Mobility   Outcome Measure  Help needed turning from your back to your side while in a flat bed without using bedrails?: A Lot Help needed moving from lying on your back to sitting on the side of a flat bed without using bedrails?: A Lot Help needed moving to and from a bed to a chair (including a wheelchair)?: A Lot Help needed standing up from a chair using your arms (e.g., wheelchair or bedside chair)?: Total Help needed to walk in hospital room?: Total Help needed climbing 3-5 steps with a railing? : Total 6 Click Score: 9    End of Session   Activity Tolerance: Patient limited by fatigue;Patient limited by pain Patient left: in bed;with bed alarm set (on bed pan, RN notified) Nurse Communication: Mobility status PT Visit Diagnosis: Muscle weakness (generalized) (M62.81);Other abnormalities of gait and mobility (R26.89);Pain Pain - Right/Left:  (buttock) Pain - part of body:  (buttocks)     Time: 1431 (1443)-1457 (1520) PT Time Calculation (min) (ACUTE ONLY): 26 min  Charges:  $Therapeutic Activity: 23-37 mins                     Greggory Stallion, PT, DPT, GCS [phone]    Heather [name] 10/28/2022, 3:43 PM

## 2022-10-28 NOTE — TOC Progression Note (Signed)
Transition of Care [facility]) - Progression Note    Patient Details  Name: Lyndi Limas MRN: [id number] Date of Birth: 1950-12-03  Transition of Care [facility]) CM/SW [place], LCSW Phone Number: 10/28/2022, 1:41 PM  Clinical Narrative:   Provided patient with bed offers so she can review. Will follow up tomorrow with decision.  Expected Discharge Plan: [place] Barriers to Discharge: Continued Medical Work up  Expected Discharge Plan and Services In-house Referral: Clinical Social Work   Post Acute Care Choice: [place] Living arrangements for the past 2 months: [place]                                       Social Determinants of Health (SDOH) Interventions SDOH Screenings   Food Insecurity: No Food Insecurity (10/18/2022)  Recent Concern: [place] Present (08/11/2022)  Housing: Low Risk  (10/18/2022)  Transportation Needs: No Transportation Needs (10/18/2022)  Utilities: Not At Risk (10/18/2022)  Financial Resource Strain: Medium Risk (10/19/2018)  Physical Activity: Inactive (10/19/2018)  Social Connections: Unknown (10/19/2018)  Tobacco Use: High Risk (10/26/2022)    Readmission Risk Interventions    11/13/2020    2:27 PM  Readmission Risk Prevention Plan  Transportation Screening Complete  PCP or Specialist Appt within 3-5 Days Complete  HRI or [place] Complete  Social Work Consult for [place] Planning/Counseling Complete  Palliative Care Screening Not Applicable  Medication Review Press photographer) Complete

## 2022-10-28 NOTE — Progress Notes (Signed)
Occupational Therapy Treatment Patient Details Name: Heather [name] MRN: [id number] DOB: 1951-03-12 Today's Date: 10/28/2022   History of present illness Heather [name] is an 72 y.o. female seen in the emergency room today after a fall and has complaints of back pain.  She was transferring from the recliner to wheelchair when she accidentally slid off to the floor and fell on her bottom and her lower back started to spasm.   OT comments  Upon entering the room, pt supine in bed and requesting assistance to change gown secondary to spilling breakfast items on herself. Max A for supine >sit to EOB. Pt needing max assistance for sitting balance and assistance with anterior weight shift to doff soiled gown and don clean gown while seated on EOB. Pt tolerate sitting on EOB for ~ 7 minutes. Lateral scoot towards the R with max A. Sit >supine with max A and repositioned for comfort. Pt continues to benefit from OT purpose with recommendation for SNF at discharge to continue to address functional deficits.    Recommendations for follow up therapy are one component of a multi-disciplinary discharge planning process, led by the attending physician.  Recommendations may be updated based on patient status, additional functional criteria and insurance authorization.    Follow Up Recommendations  Skilled nursing-short term rehab (<3 hours/day)     Assistance Recommended at Discharge Frequent or constant Supervision/Assistance  Patient can return home with the following  Two people to help with walking and/or transfers;A lot of help with bathing/dressing/bathroom;Assistance with cooking/housework;Assistance with feeding;Direct supervision/assist for medications management;Direct supervision/assist for financial management;Assist for transportation;Help with stairs or ramp for entrance   Equipment Recommendations  Other (comment) (defer to next venue of care)       Precautions / Restrictions  Precautions Precautions: Fall Precaution Comments: B groin wounds and 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       Mobility Bed Mobility Overal bed mobility: Needs Assistance Bed Mobility: Supine to Sit, Sit to Supine     Supine to sit: Max assist Sit to supine: Max assist   General bed mobility comments: Pt reports dizziness with all transitions               Balance Overall balance assessment: Needs assistance Sitting-balance support: Feet supported, Single extremity supported, Bilateral upper extremity supported Sitting balance-Leahy Scale: Poor                                     ADL either performed or assessed with clinical judgement   ADL Overall ADL's : Needs assistance/impaired                                       General ADL Comments: max A for static sitting balance to doff soiled gown and don clean hospital gown    Extremity/Trunk Assessment Upper Extremity Assessment Upper Extremity Assessment: Generalized weakness   Lower Extremity Assessment Lower Extremity Assessment: Generalized weakness        Vision Patient Visual Report: No change from baseline Vision Assessment?: No apparent visual deficits          Cognition Arousal/Alertness: Awake/alert Behavior During Therapy: WFL for tasks assessed/performed Overall Cognitive Status: Within Functional Limits for tasks assessed  General Comments: Pt is pleasant and cooperative during session                   Pertinent Vitals/ Pain       Pain Assessment Pain Assessment: Faces Faces Pain Scale: Hurts even more Pain Location: buttocks and back pain Pain Descriptors / Indicators: Discomfort, Grimacing, Guarding, Tender Pain Intervention(s): Limited activity within patient's tolerance, Monitored during session, Repositioned         Frequency  Min 2X/week        Progress Toward Goals  OT Goals(current goals can now be  found in the care plan section)  Progress towards OT goals: Progressing toward goals     Plan Discharge plan remains appropriate;Frequency remains appropriate       AM-PAC OT "6 Clicks" Daily Activity     Outcome Measure   Help from another person eating meals?: A Little Help from another person taking care of personal grooming?: A Little Help from another person toileting, which includes using toliet, bedpan, or urinal?: Total Help from another person bathing (including washing, rinsing, drying)?: A Lot Help from another person to put on and taking off regular upper body clothing?: A Lot Help from another person to put on and taking off regular lower body clothing?: Total 6 Click Score: 12    End of Session    OT Visit Diagnosis: Unsteadiness on feet (R26.81);Other abnormalities of gait and mobility (R26.89);Muscle weakness (generalized) (M62.81)   Activity Tolerance Patient limited by fatigue   Patient Left in bed;with bed alarm set;with call bell/phone within reach   Nurse Communication          Time: 1044-1100 OT Time Calculation (min): 16 min  Charges: OT General Charges $OT Visit: 1 Visit OT Treatments $Self Care/Home Management : 8-22 mins  Darleen Crocker, MS, OTR/L , CBIS ascom [phone]  10/28/22, 11:39 AM

## 2022-10-28 NOTE — Telephone Encounter (Signed)
Still admitted

## 2022-10-29 ENCOUNTER — Ambulatory Visit: Payer: Medicare HMO | Admitting: Infectious Diseases

## 2022-10-29 DIAGNOSIS — Y92009 Unspecified place in unspecified non-institutional (private) residence as the place of occurrence of the external cause: Secondary | ICD-10-CM | POA: Diagnosis not present

## 2022-10-29 DIAGNOSIS — W19XXXA Unspecified fall, initial encounter: Secondary | ICD-10-CM | POA: Diagnosis not present

## 2022-10-29 DIAGNOSIS — I10 Essential (primary) hypertension: Secondary | ICD-10-CM | POA: Diagnosis not present

## 2022-10-29 DIAGNOSIS — I5032 Chronic diastolic (congestive) heart failure: Secondary | ICD-10-CM | POA: Diagnosis not present

## 2022-10-29 LAB — GLUCOSE, CAPILLARY
Glucose-Capillary: 129 mg/dL — ABNORMAL HIGH (ref 70–99)
Glucose-Capillary: 195 mg/dL — ABNORMAL HIGH (ref 70–99)
Glucose-Capillary: 256 mg/dL — ABNORMAL HIGH (ref 70–99)
Glucose-Capillary: 191 mg/dL — ABNORMAL HIGH (ref 70–99)

## 2022-10-29 NOTE — Progress Notes (Addendum)
[facility] NAME: Heather [name]    MR#:  [id number]  DATE OF BIRTH:  01/30/1951  SUBJECTIVE:  patient denies any complaints. No family at bedside.    VITALS:  Blood pressure (!) 151/56, pulse 76, temperature 97.9 F (36.6 C), temperature source Oral, resp. rate 17, weight 52.1 kg, SpO2 95 %.  PHYSICAL EXAMINATION:   GENERAL:  72 y.o.-year-old patient with no acute distress.  LUNGS: Normal breath sounds bilaterally, no wheezing CARDIOVASCULAR: S1, S2 normal. No murmur   ABDOMEN: Soft, nontender, nondistended. Bowel sounds present.  EXTREMITIES: No  edema b/l.   AKA stump looks ok NEUROLOGIC: nonfocal  patient is alert and awake SKIN: No obvious rash, lesion, or ulcer.   LABORATORY PANEL:  CBC Recent Labs  Lab 10/26/22 0517  WBC 4.7  HGB 10.1*  HCT 32.3*  PLT 415*    Chemistries  Recent Labs  Lab 10/23/22 2043 10/24/22 0818 10/25/22 0539 10/26/22 0517  NA 137   < > 141 137  K 3.4*   < > 3.2* 4.4  CL 101   < > 107 106  CO2 28   < > 25 25  GLUCOSE 156*   < > 94 153*  BUN 12   < > 12 17  CREATININE 0.60   < > 0.68 0.78  CALCIUM 9.3   < > 8.9 8.8*  MG  --    < > 1.8  --   AST 72*  --   --   --   ALT 165*  --   --   --   ALKPHOS 342*  --   --   --   BILITOT 0.5  --   --   --    < > = values in this interval not displayed.    Assessment and Plan Heather [name] is a 72 y.o. female with PMH of CVA, ICH, BLE ischemia s/p bilateral FEA and left AKA in 07/2022, TAVR, CAD, DM-2, COPD, HTN, diastolic CHF, anxiety, seizure and recent hospitalization from 2/24-2/28 for bilateral inguinal wound infection for which she was discharged on IV Zosyn until 10/28/2022 followed by Cipro and Flagyl for another month, returning with acute back pain after accidental fall while transferring from recliner to wheelchair. Patient refused to go to SNF and discharged home with home health and DME last hospitalization. Left AMA from SNF before  that. This is his fifth admission this year. Now she is agreeable to go to SNF.   Accidental fall at home, acute right knee pain:  --Slipped and fell while transferring from recliner to wheelchair.  Denies hitting head or LOC.  Had acute back pain after fall. --  Lumbar MRI without acute finding other than chronic degenerative changes.  -- Patient has left AKA.  Now with acute right knee pain.  Previously refused to go to rehab.  She is now agreeable. --Analgesics as needed for pain.   PAD s/p bilateral FEA, left AKA and RLE fasciotomy in 07/2022, bilateral groin debridement on 09/16/2022.   --She was discharged on IV Zosyn for bilateral groin wound infection.  Completed IV Zosyn.  -- Start ciprofloxacin and Flagyl for suppressive therapy as previously planned (Dr Levonne Spiller note from 10/20/2022) for 1 month.   --Continue Plavix.  Continue local wound care   Uncontrolled IDDM-2 with hypo and hyperglycemia: A1c 12.9% on 08/05/2022. Continue Levemir.  Use NovoLog as needed for hyperglycemia.   Seizure disorder Continue Keppra  Hypokalemia Improved   Other comorbidities include COPD, CAD, chronic diastolic CHF, hypertension history of CVA/intracranial hemorrhage in 07/2022, tobacco use disorder    overall medically stable. Will discharge to rehab once auth obtained  CODE STATUS: full DVT Prophylaxis :enoxaparin Level of care: Med-Surg Status is: Inpatient Remains inpatient appropriate because: awaiting insuracne auth    TOTAL TIME TAKING CARE OF THIS PATIENT: 35 minutes.  >50% time spent on counselling and coordination of care  Note: This dictation was prepared with Dragon dictation along with smaller phrase technology. Any transcriptional errors that result from this process are unintentional.  Fritzi Mandes M.D    [facility]   CC: Primary care physician; [name], [place]

## 2022-10-29 NOTE — Progress Notes (Signed)
Occupational Therapy Treatment Patient Details Name: Heather [name] MRN: [id number] DOB: Jul 17, 1951 Today's Date: 10/29/2022   History of present illness Heather [name] is an 72 y.o. female seen in the emergency room today after a fall and has complaints of back pain.  She was transferring from the recliner to wheelchair when she accidentally slid off to the floor and fell on her bottom and her lower back started to spasm.   OT comments  Upon entering the room, pt supine in bed with family present assisting her with combing hair. Pt then agreeable to sitting EOB to wash hair. All needed items set up for tasks. Covers removed and then pt states, " Well I need to get cleaned up". Pt is aware she has had BM but did not call for assistance for staff. Pt rolls with mod A L <> R and total A for hygiene and application of barrier cream. OT asking RN to look at pt's buttocks secondary to active bleeding. Use of chux pad to assist pt to EOB to attempt further shearing injury on skin. Pt able to maintain static sitting balance with min - mod A while therapist assisted with washing hair. Pt improving sitting balance this session. She sat EOB for ~ 10 minutes and then returned to supine with max A. Pt's family remains in room. Call bell and all needed items within reach upon exiting the room.    Recommendations for follow up therapy are one component of a multi-disciplinary discharge planning process, led by the attending physician.  Recommendations may be updated based on patient status, additional functional criteria and insurance authorization.    Follow Up Recommendations  Skilled nursing-short term rehab (<3 hours/day)     Assistance Recommended at Discharge Frequent or constant Supervision/Assistance  Patient can return home with the following  Two people to help with walking and/or transfers;A lot of help with bathing/dressing/bathroom;Assistance with cooking/housework;Assistance with feeding;Direct  supervision/assist for medications management;Direct supervision/assist for financial management;Assist for transportation;Help with stairs or ramp for entrance   Equipment Recommendations  Other (comment) (defer to next venue of care)       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       Mobility Bed Mobility Overal bed mobility: Needs Assistance Bed Mobility: Supine to Sit, Sit to Supine     Supine to sit: Max assist Sit to supine: Total assist             Balance Overall balance assessment: Needs assistance Sitting-balance support: Feet supported, Single extremity supported, Bilateral upper extremity supported Sitting balance-Leahy Scale: Poor                                     ADL either performed or assessed with clinical judgement   ADL Overall ADL's : Needs assistance/impaired                                       General ADL Comments: total A for hygiene after having BM in bed. Pt maintaining static sitting balance with min - mod A while therapist assisted with washing her hair.    Extremity/Trunk Assessment Upper Extremity Assessment Upper Extremity Assessment: Generalized weakness   Lower Extremity Assessment Lower Extremity Assessment: Generalized weakness         Cognition  Arousal/Alertness: Awake/alert Behavior During Therapy: WFL for tasks assessed/performed Overall Cognitive Status: Within Functional Limits for tasks assessed                                                     Pertinent Vitals/ Pain       Pain Assessment Pain Assessment: Faces Faces Pain Scale: Hurts little more Pain Location: buttocks and back pain Pain Descriptors / Indicators: Discomfort, Grimacing, Guarding, Tender Pain Intervention(s): Limited activity within patient's tolerance, Repositioned, Monitored during session         Frequency   Min 2X/week        Progress Toward Goals  OT Goals(current goals can now be found in the care plan section)  Progress towards OT goals: Progressing toward goals     Plan Discharge plan remains appropriate;Frequency remains appropriate       AM-PAC OT "6 Clicks" Daily Activity     Outcome Measure   Help from another person eating meals?: None Help from another person taking care of personal grooming?: A Little Help from another person toileting, which includes using toliet, bedpan, or urinal?: Total Help from another person bathing (including washing, rinsing, drying)?: A Lot Help from another person to put on and taking off regular upper body clothing?: A Lot Help from another person to put on and taking off regular lower body clothing?: Total 6 Click Score: 13    End of Session    OT Visit Diagnosis: Unsteadiness on feet (R26.81);Other abnormalities of gait and mobility (R26.89);Muscle weakness (generalized) (M62.81)   Activity Tolerance Patient tolerated treatment well   Patient Left in bed;with bed alarm set;with call bell/phone within reach;with family/visitor present   Nurse Communication Mobility status;Other (comment) (skin check on buttocks secondary to skin concerns)        Time: [id number] OT Time Calculation (min): 23 min  Charges: OT General Charges $OT Visit: 1 Visit OT Treatments $Self Care/Home Management : 23-37 mins  Darleen Crocker, MS, OTR/L , CBIS ascom [phone]  10/29/22, 3:34 PM

## 2022-10-29 NOTE — Telephone Encounter (Signed)
Admitted  Closing encounter

## 2022-10-29 NOTE — TOC Progression Note (Signed)
Transition of Care [facility]) - Progression Note    Patient Details  Name: Heather [name] MRN: [id number] Date of Birth: [birth year]  [facility]) CM/SW [place], LCSW Phone Number: 10/29/2022, 9:08 AM  Clinical Narrative:  MD called CSW while in the room with patient. Patient has accepted bed offer from [facility] in [place]. CSW spoke to Waldron Session to notify. Sent secure chat to [place] asking her to start auth.   Expected Discharge Plan: [place] Barriers to Discharge: Continued Medical Work up  Expected Discharge Plan and Services In-house Referral: Clinical Social Work   Post Acute Care Choice: [place] Living arrangements for the past 2 months: [place]                                       Social Determinants of Health (SDOH) Interventions SDOH Screenings   Food Insecurity: No Food Insecurity (10/18/2022)  Recent Concern: [place] Present (08/11/2022)  Housing: Low Risk  (10/18/2022)  Transportation Needs: No Transportation Needs (10/18/2022)  Utilities: Not At Risk (10/18/2022)  Financial Resource Strain: Medium Risk (10/19/2018)  Physical Activity: Inactive (10/19/2018)  Social Connections: Unknown (10/19/2018)  Tobacco Use: High Risk (10/26/2022)    Readmission Risk Interventions    11/13/2020    2:27 PM  Readmission Risk Prevention Plan  Transportation Screening Complete  PCP or Specialist Appt within 3-5 Days Complete  HRI or [place] Complete  Social Work Consult for [place] Planning/Counseling Complete  Palliative Care Screening Not Applicable  Medication Review Press photographer) Complete

## 2022-10-30 DIAGNOSIS — I5032 Chronic diastolic (congestive) heart failure: Secondary | ICD-10-CM | POA: Diagnosis not present

## 2022-10-30 DIAGNOSIS — I10 Essential (primary) hypertension: Secondary | ICD-10-CM | POA: Diagnosis not present

## 2022-10-30 DIAGNOSIS — W19XXXA Unspecified fall, initial encounter: Secondary | ICD-10-CM | POA: Diagnosis not present

## 2022-10-30 DIAGNOSIS — Y92009 Unspecified place in unspecified non-institutional (private) residence as the place of occurrence of the external cause: Secondary | ICD-10-CM | POA: Diagnosis not present

## 2022-10-30 LAB — GLUCOSE, CAPILLARY
Glucose-Capillary: 155 mg/dL — ABNORMAL HIGH (ref 70–99)
Glucose-Capillary: 257 mg/dL — ABNORMAL HIGH (ref 70–99)
Glucose-Capillary: 241 mg/dL — ABNORMAL HIGH (ref 70–99)
Glucose-Capillary: 246 mg/dL — ABNORMAL HIGH (ref 70–99)

## 2022-10-30 NOTE — Progress Notes (Signed)
Physical Therapy Treatment Patient Details Name: Heather [name] MRN: [id number] DOB: Nov 04, 1950 Today's Date: 10/30/2022   History of Present Illness Heather [name] is an 72 y.o. female seen in the emergency room today after a fall and has complaints of back pain.  She was transferring from the recliner to wheelchair when she accidentally slid off to the floor and fell on her bottom and her lower back started to spasm.    PT Comments    Pt awaiting possible d/c to SNF today. Resting in bed upon arrival. Pt completed sitting EOB x 15 minutes with R foot on floor and single UE assist. No LOB while reaching for objects on bedside table. Pt with initial c/o dizziness which resolved. Pt assisted back to supine, rolling for hygiene after BM. Overall pt remains weak throughout, unable to attain standing due to weakness and slight knee flexion contracture noted.    Recommendations for follow up therapy are one component of a multi-disciplinary discharge planning process, led by the attending physician.  Recommendations may be updated based on patient status, additional functional criteria and insurance authorization.  Follow Up Recommendations  Skilled nursing-short term rehab (<3 hours/day) Can patient physically be transported by private vehicle: No   Assistance Recommended at Discharge Frequent or constant Supervision/Assistance  Patient can return home with the following Two people to help with walking and/or transfers;Two people to help with bathing/dressing/bathroom;Help with stairs or ramp for entrance;Assist for transportation;Assistance with cooking/housework   Equipment Recommendations  Other (comment) (TBD at next facility)    Recommendations for Other Services       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 Needs slight assist for R LE.    Transfers                        Ambulation/Gait                   Stairs             Wheelchair Mobility    Modified Rankin (Stroke Patients Only)       Balance Overall balance assessment: Needs assistance Sitting-balance support: Single extremity supported (R foot supported) Sitting balance-Leahy Scale: Fair Sitting balance - Comments: Pt sat EOB for 15 minutes without LOB                                    Cognition Arousal/Alertness: Awake/alert Behavior During Therapy: WFL for tasks assessed/performed Overall Cognitive Status: Within Functional Limits for tasks assessed                                          Exercises General Exercises - Lower Extremity Ankle Circles/Pumps: AROM, Right, 10 reps Long Arc Quad: AAROM, Right, 5 reps Heel Slides: AAROM, Right, 5 reps Hip ABduction/ADduction:  AAROM, Right, 5 reps Other Exercises Other Exercises: 2+/3 B LE strength, unable to attain standing    General Comments General comments (skin integrity, edema, etc.):  (Pt incontinent of loose stool after sitting EOB, Assist for rolling and hygiene. Buttocks red with open areas, skin protectant applied and pt encourage to lay on side)      Pertinent Vitals/Pain Pain Assessment Pain Assessment: No/denies pain    Home Living                          Prior Function            PT Goals (current goals can now be found in the care plan section) Acute Rehab PT Goals Patient Stated Goal: improve her pain    Frequency    Min  2X/week      PT Plan Current plan remains appropriate    Co-evaluation              AM-PAC PT "6 Clicks" Mobility   Outcome Measure  Help needed turning from your back to your side while in a flat bed without using bedrails?: A Little Help needed moving from lying on your back to sitting on the side of a flat bed without using bedrails?: A Little Help needed moving to and from a bed to a chair (including a wheelchair)?: A Little Help needed standing up from a chair using your arms (e.g., wheelchair or bedside chair)?: Total Help needed to walk in hospital room?: Total Help needed climbing 3-5 steps with a railing? : Total 6 Click Score: 12    End of Session   Activity Tolerance: Patient limited by fatigue;Patient limited by pain Patient left: in bed;with bed alarm set Nurse Communication: Mobility status PT Visit Diagnosis: Muscle weakness (generalized) (M62.81);Other abnormalities of gait and mobility (R26.89);Pain     Time: 1422-1450 PT Time Calculation (min) (ACUTE ONLY): 28 min  Charges:  $Therapeutic Exercise: 8-22 mins $Therapeutic Activity: 8-22 mins                    Mikel Cella, PTA    [name] 10/30/2022, 2:54 PM

## 2022-10-30 NOTE — TOC Progression Note (Addendum)
Transition of Care [facility]) - Progression Note    Patient Details  Name: Heather [name] MRN: [id number] Date of Birth: [birth year]  Transition of Care [facility]) CM/SW [place], LCSW Phone Number: 10/30/2022, 11:48 AM  Clinical Narrative:   Josem Kaufmann still pending.  1:51: Auth [id number]. Valid 3/7-3/10. [facility] her today. Left voicemail for EMS crew chief to see if they can transport her to [place] today.  2:52 pm: Updated patient.  Expected Discharge Plan: [place] Barriers to Discharge: Continued Medical Work up  Expected Discharge Plan and Services In-house Referral: Clinical Social Work   Post Acute Care Choice: [place] Living arrangements for the past 2 months: [place]                                       Social Determinants of Health (SDOH) Interventions SDOH Screenings   Food Insecurity: No Food Insecurity (10/18/2022)  Recent Concern: [place] Present (08/11/2022)  Housing: Low Risk  (10/18/2022)  Transportation Needs: No Transportation Needs (10/18/2022)  Utilities: Not At Risk (10/18/2022)  Financial Resource Strain: Medium Risk (10/19/2018)  Physical Activity: Inactive (10/19/2018)  Social Connections: Unknown (10/19/2018)  Tobacco Use: High Risk (10/26/2022)    Readmission Risk Interventions    11/13/2020    2:27 PM  Readmission Risk Prevention Plan  Transportation Screening Complete  PCP or Specialist Appt within 3-5 Days Complete  HRI or [place] Complete  Social Work Consult for [place] Planning/Counseling Complete  Palliative Care Screening Not Applicable  Medication Review Press photographer) Complete

## 2022-10-30 NOTE — Care Management Important Message (Signed)
Important Message  Patient Details  Name: Heather [name] MRN: [id number] Date of Birth: Jul 18, 1951   Medicare Important Message Given:  Yes     Dannette Barbara 10/30/2022, 2:52 PM

## 2022-10-30 NOTE — Progress Notes (Signed)
[facility] NAME: Heather [name]    MR#:  [id number]  DATE OF BIRTH:  1951-04-06  SUBJECTIVE:  patient denies any complaints. No family at bedside.    VITALS:  Blood pressure 131/60, pulse 70, temperature 98.2 F (36.8 C), resp. rate 18, weight 52.1 kg, SpO2 95 %.  PHYSICAL EXAMINATION:   GENERAL:  72 y.o.-year-old patient with no acute distress.  LUNGS: Normal breath sounds bilaterally, no wheezing CARDIOVASCULAR: S1, S2 normal. No murmur   ABDOMEN: Soft, nontender, nondistended. Bowel sounds present.  EXTREMITIES: No  edema b/l.   AKA stump looks ok NEUROLOGIC: nonfocal  patient is alert and awake SKIN: No obvious rash, lesion, or ulcer.   LABORATORY PANEL:  CBC Recent Labs  Lab 10/26/22 0517  WBC 4.7  HGB 10.1*  HCT 32.3*  PLT 415*     Chemistries  Recent Labs  Lab 10/23/22 2043 10/24/22 0818 10/25/22 0539 10/26/22 0517  NA 137   < > 141 137  K 3.4*   < > 3.2* 4.4  CL 101   < > 107 106  CO2 28   < > 25 25  GLUCOSE 156*   < > 94 153*  BUN 12   < > 12 17  CREATININE 0.60   < > 0.68 0.78  CALCIUM 9.3   < > 8.9 8.8*  MG  --    < > 1.8  --   AST 72*  --   --   --   ALT 165*  --   --   --   ALKPHOS 342*  --   --   --   BILITOT 0.5  --   --   --    < > = values in this interval not displayed.     Assessment and Plan Heather [name] is a 72 y.o. female with PMH of CVA, ICH, BLE ischemia s/p bilateral FEA and left AKA in 07/2022, TAVR, CAD, DM-2, COPD, HTN, diastolic CHF, anxiety, seizure and recent hospitalization from 2/24-2/28 for bilateral inguinal wound infection for which she was discharged on IV Zosyn until 10/28/2022 followed by Cipro and Flagyl for another month, returning with acute back pain after accidental fall while transferring from recliner to wheelchair. Patient refused to go to SNF and discharged home with home health and DME last hospitalization. Left AMA from SNF before that. This is his fifth  admission this year. Now she is agreeable to go to SNF.   Accidental fall at home, acute right knee pain:  --Slipped and fell while transferring from recliner to wheelchair.  Denies hitting head or LOC.  Had acute back pain after fall. --  Lumbar MRI without acute finding other than chronic degenerative changes.  -- Patient has left AKA.  Now with acute right knee pain.  Previously refused to go to rehab.  She is now agreeable. --Analgesics as needed for pain.   PAD s/p bilateral FEA, left AKA and RLE fasciotomy in 07/2022, bilateral groin debridement on 09/16/2022.   --She was discharged on IV Zosyn for bilateral groin wound infection.  Completed IV Zosyn.  -- Start ciprofloxacin and Flagyl for suppressive therapy as previously planned (Dr Levonne Spiller note from 10/20/2022) for 1 month.   --Continue Plavix.  Continue local wound care   Uncontrolled IDDM-2 with hypo and hyperglycemia: A1c 12.9% on 08/05/2022. Continue Levemir.  Use NovoLog as needed for hyperglycemia.   Seizure disorder Continue Keppra   Hypokalemia Improved  Other comorbidities include COPD, CAD, chronic diastolic CHF, hypertension history of CVA/intracranial hemorrhage in 07/2022, tobacco use disorder    overall medically stable. Will discharge to rehab once auth obtained  CODE STATUS: full DVT Prophylaxis :enoxaparin Level of care: Med-Surg Status is: Inpatient Remains inpatient appropriate because: awaiting insuracne auth    TOTAL TIME TAKING CARE OF THIS PATIENT: 35 minutes.  >50% time spent on counselling and coordination of care  Note: This dictation was prepared with Dragon dictation along with smaller phrase technology. Any transcriptional errors that result from this process are unintentional.  Fritzi Mandes M.D    [facility]   CC: Primary care physician; [name], [place]

## 2022-10-30 NOTE — TOC Progression Note (Signed)
Transition of Care [facility]) - Progression Note    Patient Details  Name: Heather [name] MRN: [id number] Date of Birth: 29-Mar-1951  Transition of [facility]) CM/SW Contact  Ross Ludwig, [place] Phone Number: 10/30/2022, 9:38 AM  Clinical Narrative:    Hart Carwin authorization started for [facility], pending cert is [id number].  Awaiting insurance authorization for patient to go to SNF.   Expected Discharge Plan: [place] Barriers to Discharge: Continued Medical Work up  Expected Discharge Plan and Services In-house Referral: Clinical Social Work   Post Acute Care Choice: [place] arrangements for the past 2 months: [place]                                       Social Determinants of Health (SDOH) Interventions SDOH Screenings   Food Insecurity: No Food Insecurity (10/18/2022)  Recent Concern: [place] Present (08/11/2022)  Housing: Low Risk  (10/18/2022)  Transportation Needs: No Transportation Needs (10/18/2022)  Utilities: Not At Risk (10/18/2022)  Financial Resource Strain: Medium Risk (10/19/2018)  Physical Activity: Inactive (10/19/2018)  Social Connections: Unknown (10/19/2018)  Tobacco Use: High Risk (10/26/2022)    Readmission Risk Interventions    11/13/2020    2:27 PM  Readmission Risk Prevention Plan  Transportation Screening Complete  PCP or Specialist Appt within 3-5 Days Complete  HRI or [place] Complete  Social Work Consult for [place] Planning/Counseling Complete  Palliative Care Screening Not Applicable  Medication Review Press photographer) Complete

## 2022-10-31 DIAGNOSIS — Y92009 Unspecified place in unspecified non-institutional (private) residence as the place of occurrence of the external cause: Secondary | ICD-10-CM | POA: Diagnosis not present

## 2022-10-31 DIAGNOSIS — W19XXXA Unspecified fall, initial encounter: Secondary | ICD-10-CM | POA: Diagnosis not present

## 2022-10-31 DIAGNOSIS — I5032 Chronic diastolic (congestive) heart failure: Secondary | ICD-10-CM | POA: Diagnosis not present

## 2022-10-31 DIAGNOSIS — I10 Essential (primary) hypertension: Secondary | ICD-10-CM | POA: Diagnosis not present

## 2022-10-31 LAB — GLUCOSE, CAPILLARY: Glucose-Capillary: 165 mg/dL — ABNORMAL HIGH (ref 70–99)

## 2022-10-31 LAB — CREATININE, SERUM
Creatinine, Ser: 0.66 mg/dL (ref 0.44–1.00)
GFR, Estimated: >60 mL/min (ref >60)

## 2022-10-31 MED ORDER — DICLOFENAC SODIUM 1 % EX GEL: 2.0000 g | Freq: Four times a day (QID) | CUTANEOUS | 0 refills | Status: DC | PRN

## 2022-10-31 MED ORDER — NICOTINE 14 MG/24HR TD PT24: 14.0000 mg | MEDICATED_PATCH | Freq: Every day | TRANSDERMAL | 0 refills | Status: DC

## 2022-10-31 NOTE — Discharge Instructions (Signed)
>    Do not take Ciprofloxacin within 2 hours of multivitamin

## 2022-10-31 NOTE — Discharge Summary (Signed)
Physician Discharge Summary   Patient: Heather [name] MRN: [id number] DOB: 1951-07-08  Admit date:     10/23/2022  Discharge date: 10/31/22  Discharge Physician: Fritzi Mandes   PCP: [name], FNP   Recommendations at discharge:   F/u Dr Delana Meyer left AKA  F/u PCP in 1-2 weeks  Discharge Diagnoses: Principal Problem:   Fall at home, initial encounter Active Problems:   Diabetes mellitus ([place])   Hypertension, benign   Coronary artery disease   COPD (chronic obstructive pulmonary disease) ([place])   Tobacco use disorder   PAD (peripheral artery disease) ([place])   History of intracranial hemorrhage   Chronic diastolic CHF (congestive heart failure) ([facility])   Generalized weakness   Open wound of inguinal region with complication   Hx of AKA (above knee amputation), left ([place])   History of seizure   Right knee pain   Heather [name] is a 72 y.o. female with PMH of CVA, ICH, BLE ischemia s/p bilateral FEA and left AKA in 07/2022, TAVR, CAD, DM-2, COPD, HTN, diastolic CHF, anxiety, seizure and recent hospitalization from 2/24-2/28 for bilateral inguinal wound infection for which she was discharged on IV Zosyn until 10/28/2022 followed by Cipro and Flagyl for another month, returning with acute back pain after accidental fall while transferring from recliner to wheelchair. Patient refused to go to SNF and discharged home with home health and DME last hospitalization. Left AMA from SNF before that. This is his fifth admission this year. Now she is agreeable to go to SNF.    Accidental fall at home, acute right knee pain:  --Slipped and fell while transferring from recliner to wheelchair.  Denies hitting head or LOC.  Had acute back pain after fall. --  Lumbar MRI without acute finding other than chronic degenerative changes.  -- Patient has left AKA.  Now with acute right knee pain.  Previously refused to go to rehab.  She is now agreeable. --Analgesics as needed for pain. --to rehab today   PAD  s/p bilateral FEA, left AKA and RLE fasciotomy in 07/2022, bilateral groin debridement on 09/16/2022.   --She was discharged on IV Zosyn for bilateral groin wound infection.  Completed IV Zosyn.  -- Start ciprofloxacin and Flagyl for suppressive therapy as previously planned (Dr Levonne Spiller note from 10/20/2022) for 1 month. (End date 11/28/22)  --Continue Plavix.  Continue local wound care --pt to f/u Dr Delana Meyer   Uncontrolled IDDM-2 with hypo and hyperglycemia: A1c 12.9% on 08/05/2022. Continue Levemir.  Use NovoLog as needed for hyperglycemia.   Seizure disorder Continue Keppra   Hypokalemia Improved   Other comorbidities include COPD, CAD, chronic diastolic CHF, hypertension history of CVA/intracranial hemorrhage in 07/2022, tobacco use disorder     overall medically stable. Will discharge to rehab today since auth obtained   CODE STATUS: full DVT Prophylaxis :enoxaparin Level of care: Med-Surg      Disposition: rehab Diet recommendation:  Discharge Diet Orders (From admission, onward)     Start     Ordered   10/31/22 0000  Diet - low sodium heart healthy        10/31/22 0745           Cardiac and Carb modified diet DISCHARGE MEDICATION: Allergies as of 10/31/2022       Reactions   Iodinated Contrast Media Hives   Tramadol Hcl Other (See Comments)   SEIZURE   Sulfa Antibiotics Hives   Shellfish Allergy Nausea And Vomiting, Rash   Scallops specifically  Medication List     STOP taking these medications    docusate sodium 100 MG capsule Commonly known as: Colace   dronabinol 2.5 MG capsule Commonly known as: MARINOL   fluticasone 50 MCG/ACT nasal spray Commonly known as: FLONASE   fluticasone-salmeterol 250-50 MCG/ACT Aepb Commonly known as: ADVAIR   heparin lock flush 100 UNIT/ML Soln injection   nystatin cream Commonly known as: MYCOSTATIN   piperacillin-tazobactam  IVPB Commonly known as: ZOSYN   sodium chloride flush 0.9 % Soln  injection       TAKE these medications    acetaminophen 325 MG tablet Commonly known as: TYLENOL Take 2 tablets (650 mg total) by mouth every 6 (six) hours as needed for mild pain (or Fever >/= 101).   albuterol 108 (90 Base) MCG/ACT inhaler Commonly known as: VENTOLIN HFA Inhale 2 puffs into the lungs every 4 (four) hours as needed for wheezing or shortness of breath.   amLODipine 10 MG tablet Commonly known as: NORVASC Take 1 tablet (10 mg total) by mouth daily.   atorvastatin 80 MG tablet Commonly known as: LIPITOR Take 80 mg by mouth at bedtime.   carvedilol 6.25 MG tablet Commonly known as: COREG Take 1 tablet (6.25 mg total) by mouth 2 (two) times daily with a meal.   cetirizine 10 MG tablet Commonly known as: ZYRTEC Take 10 mg by mouth daily.   cholecalciferol 25 MCG (1000 UT) tablet Take 1,000 Units by mouth daily.   ciprofloxacin 500 MG tablet Commonly known as: Cipro Take 1 tablet (500 mg total) by mouth 2 (two) times daily. Per infectious diseases: start 10/29/2022 after completes zosyn IV On 10/28/2022. To take with metronidazole Indication: suppressive therapy for groin wounds   clopidogrel 75 MG tablet Commonly known as: PLAVIX Take 1 tablet (75 mg total) by mouth daily.   diclofenac Sodium 1 % Gel Commonly known as: VOLTAREN Apply 2 g topically 4 (four) times daily as needed.   escitalopram 10 MG tablet Commonly known as: LEXAPRO Take 1 tablet (10 mg total) by mouth daily.   hydrALAZINE 50 MG tablet Commonly known as: APRESOLINE Take 1 tablet (50 mg total) by mouth 3 (three) times daily.   insulin aspart 100 UNIT/ML injection Commonly known as: novoLOG Inject 3 Units into the skin 3 (three) times daily with meals. Hold for NPO or consuming less than 50% of meals What changed:  when to take this additional instructions   insulin aspart 100 UNIT/ML FlexPen Commonly known as: NOVOLOG Before each meal 3 times a day, 140-199 - 2 units, 200-250 - 4  units, 251-299 - 8 units,  300-349 - 10 units,  350 or above 12 units. I What changed: Another medication with the same name was changed. Make sure you understand how and when to take each.   insulin glargine 100 UNIT/ML injection Commonly known as: LANTUS Inject 0.1 mLs (10 Units total) into the skin 2 (two) times daily.   levETIRAcetam 500 MG tablet Commonly known as: KEPPRA Take 1 tablet (500 mg total) by mouth 2 (two) times daily.   metroNIDAZOLE 500 MG tablet Commonly known as: Flagyl Take 1 tablet (500 mg total) by mouth 2 (two) times daily for 60 doses. Per infectious diseases: start 10/29/2022 after completes zosyn IV On 10/28/2022. To take with ciprofloxacin Indication: suppressive therapy for groin wounds   mirtazapine 7.5 MG tablet Commonly known as: REMERON Take 1 tablet (7.5 mg total) by mouth at bedtime.   multivitamin with minerals tablet Take  1 tablet by mouth daily.   nicotine 14 mg/24hr patch Commonly known as: NICODERM CQ - dosed in mg/24 hours Place 1 patch (14 mg total) onto the skin daily.   pantoprazole 40 MG tablet Commonly known as: PROTONIX Take 1 tablet (40 mg total) by mouth daily.   polyethylene glycol 17 g packet Commonly known as: MIRALAX / GLYCOLAX Take 17 g by mouth daily. Titrate as needed for constipation. What changed: additional instructions   senna-docusate 8.6-50 MG tablet Commonly known as: Senokot-S Take 1 tablet by mouth daily.   traZODone 50 MG tablet Commonly known as: DESYREL Take 0.5 tablets (25 mg total) by mouth at bedtime as needed for sleep. What changed: how much to take               Discharge Care Instructions  (From admission, onward)           Start     Ordered   10/31/22 0000  Discharge wound care:       Comments: Pressure Ulcer  Duration         Pressure Injury 10/02/22 Ischial tuberosity Left Stage 2 -  Partial thickness loss of dermis presenting as a shallow open injury with a red, pink wound bed  without slough. 1 cm x 2 cm x 0.1 cm 28 days   Pressure Injury 10/18/22 Sacrum Mid Stage 2 -  Partial thickness loss of dermis presenting as a shallow open injury with a red, pink wound bed without slough. pink open area in center 12 days      Wound Care Orders (From admission, onward)      Start     Ordered   10/24/22 1140    Wound care  Every other day    Comments: Cleanse wounds with saline, pat dry Cut to fit silver hydrofiber Kellie Simmering # [phone]) and place in wound beds of the bilateral groin wounds, top with foam. Change every other day  0   10/31/22 0745            Contact information for follow-up providers     [name], [place]. Schedule an appointment as soon as possible for a visit in 1 week(s).   Specialty: Family Medicine Contact information: [address] [place] [address] [phone]         Schnier, Dolores Lory, MD. Schedule an appointment as soon as possible for a visit in 3 week(s).   Specialties: Vascular Surgery, Cardiology, Radiology, Vascular Surgery Why: f/u left [facility] Contact information: [place] [address] [phone]              Contact information for after-discharge care     [facility] .   Service: Skilled Nursing Contact information: [address] [place] [phone]                     Filed Weights   10/28/22 0500 10/29/22 0446 10/31/22 0603  Weight: 52.1 kg 52.1 kg 48.7 kg     Condition at discharge: fair  The results of significant diagnostics from this hospitalization (including imaging, microbiology, ancillary and laboratory) are listed below for reference.   Imaging Studies: DG Knee 1-2 Views Right  Result Date: 10/24/2022 CLINICAL DATA:  Right knee pain after fall last night. EXAM: RIGHT KNEE - 1-2 VIEW COMPARISON:  None Available. FINDINGS: No evidence of fracture, dislocation, or joint effusion. No  evidence of  arthropathy or other focal bone abnormality. Vascular calcifications are noted as well as stent in superficial femoral artery. IMPRESSION: No acute abnormality seen. Electronically Signed   By: Marijo Conception M.D.   On: 10/24/2022 09:50   US Abdomen Limited RUQ (LIVER/GB)  Result Date: 10/23/2022 CLINICAL DATA:  Abnormal LFTs.  History of cholecystectomy. EXAM: ULTRASOUND ABDOMEN LIMITED RIGHT UPPER QUADRANT COMPARISON:  CT 11/10/2020 FINDINGS: Gallbladder: Cholecystectomy. Common bile duct: Diameter: 8 mm.  No intrahepatic biliary dilation. Liver: No focal lesion identified. Within normal limits in parenchymal echogenicity. Portal vein is patent on color Doppler imaging with normal direction of blood flow towards the liver. Other: None. IMPRESSION: 1. No sonographic abnormality. 2. Common bile duct measures 8 mm, which is likely due to reservoir effect status post cholecystectomy. Electronically Signed   By: Placido Sou M.D.   On: 10/23/2022 23:57   MR Lumbar Spine W Wo Contrast  Result Date: 10/23/2022 CLINICAL DATA:  Initial evaluation for low back pain, fall, infection suspected. EXAM: MRI LUMBAR SPINE WITHOUT AND WITH CONTRAST TECHNIQUE: Multiplanar and multiecho pulse sequences of the lumbar spine were obtained without and with intravenous contrast. CONTRAST:  29m GADAVIST GADOBUTROL 1 MMOL/ML IV SOLN COMPARISON:  Radiograph from 10/18/2022. FINDINGS: Segmentation: Standard. Lowest well-formed disc space labeled the L5-S1 level. Alignment: Mild levoscoliosis. Trace 3 mm facet mediated anterolisthesis of L4 on L5. Vertebrae: Mild chronic compression deformities involving the superior endplates of T624THLand T624THLnoted. Vertebral body height otherwise maintained with no acute or recent fracture. Bone marrow signal intensity within normal limits. No worrisome osseous lesions. Prominent endplate changes with marrow edema and enhancement about the L4-5 and L5-S1 interspaces, favored to be  degenerative. No other evidence for acute infection within the lumbar spine. Conus medullaris and cauda equina: Conus extends to the L1 level. Conus and cauda equina appear normal. Paraspinal and other soft tissues: Paraspinous soft tissues demonstrate no acute finding. Colonic diverticulosis noted. Small volume free fluid noted within the presacral space, nonspecific. Probable small uterine fibroid noted as well. Disc levels: T10-11: Seen only on sagittal projection. Minimal disc bulge with trace bony retropulsion related to the T11 compression fracture. No significant spinal stenosis. T11-12: Disc desiccation with minimal disc bulge.  No stenosis. T12-L1: Unremarkable. L1-2:  Unremarkable. L2-3:  Unremarkable. L3-4: Mild degenerative intervertebral disc space narrowing with diffuse disc bulge and disc desiccation. Mild left facet hypertrophy. No spinal stenosis. Foramina remain patent. L4-5: Advanced degenerative intervertebral disc space narrowing with diffuse disc bulge and disc desiccation. Associated reactive endplate change with marginal endplate osteophytic spurring. Superimposed left subarticular disc protrusion with slight inferior migration (series 8, image 27). Moderate right worse than left facet hypertrophy. Resultant moderate canal with bilateral subarticular stenosis, worse on the left. Moderate right with mild to moderate left L4 foraminal narrowing. L5-S1: Advanced degenerative intervertebral disc space narrowing with disc desiccation and diffuse disc bulge. Associated reactive endplate spurring. Mild left greater than right facet hypertrophy. Mild epidural lipomatosis. No significant spinal stenosis. Moderate left L5 foraminal narrowing. Right neural foramen remains patent. IMPRESSION: 1. No MRI evidence for acute infection within the lumbar spine. 2. Degenerative spondylosis at L4-5 with resultant moderate canal and bilateral subarticular stenosis, with moderate right worse than left L4 foraminal  narrowing. 3. Degenerative disc bulge with facet hypertrophy at L5-S1 with resultant moderate left L5 foraminal stenosis. 4. Prominent endplate changes with marrow edema and enhancement about the L4-5 and L5-S1 interspaces, favored to be degenerative in nature. Finding could serve as a  source for lower back pain. 5. Mild chronic compression deformities involving the superior endplates of 624THL and 624THL. 6. Small volume free fluid within the presacral space, nonspecific. 7. Colonic diverticulosis. Electronically Signed   By: Jeannine Boga M.D.   On: 10/23/2022 21:58   [place] EKG SITE RITE  Result Date: 10/22/2022 If [facility] image not attached, placement could not be confirmed due to current cardiac rhythm.  DG Lumbar Spine 2-3 Views  Result Date: 10/18/2022 CLINICAL DATA:  Back pain after falling out of recliner EXAM: LUMBAR SPINE - 2-3 VIEW COMPARISON:  Lumbar spine CT 08/03/2022 FINDINGS: No evidence of acute fracture or traumatic malalignment. Unchanged chronic compression deformity of T11 and T12. Advanced degenerative disc disease at L4-L5 and L5-S1. Lower lumbar facet arthropathy. Iliac vascular stents. Cholecystectomy. IMPRESSION: 1. No acute fracture or traumatic malalignment. 2. Advanced degenerative disc disease at L4-L5 and L5-S1. Electronically Signed   By: Placido Sou M.D.   On: 10/18/2022 01:27   DG Chest 2 View  Result Date: 10/17/2022 CLINICAL DATA:  Weakness. EXAM: CHEST - 2 VIEW COMPARISON:  10/01/2022. FINDINGS: Left upper extremity PICC tip overlies the SVC.The cardiomediastinal contours are normal. TAVR. Pulmonary vasculature is normal. No consolidation, pleural effusion, or pneumothorax. Mild chronic lower thoracic compression deformity no acute osseous abnormalities are seen. IMPRESSION: 1. No acute chest findings. 2. Left upper extremity PICC tip overlies the SVC. Electronically Signed   By: Keith Rake M.D.   On: 10/17/2022 21:51   Overnight EEG with video  Result  Date: 10/03/2022 Heather Havens, MD     10/03/2022 10:43 PM Patient Name: Heather [name] MRN: [id number] Epilepsy Attending: Lora [name] Referring Physician/Provider: Amie Portland, MD Duration: 10/02/2022 1816 to 10/03/2022 1816  Patient history: 72yo F with ams. EEG to evaluate for seizure  Level of alertness: Awake  AEDs during EEG study: None  Technical aspects: This EEG study was done with scalp electrodes positioned according to the 10-20 International system of electrode placement. Electrical activity was reviewed with band pass filter of 1-'70Hz'$ , sensitivity of 7 uV/mm, display speed of 29m/sec with a '60Hz'$  notched filter applied as appropriate. EEG data were recorded continuously and digitally stored.  Video monitoring was available and reviewed as appropriate.  Description: EEG initially showed continuous generalized 3 to 6 Hz theta-delta slowing. Generalized periodic discharges with triphasic morphology were noted with fluctuating frequency of 1.5 to '3hz'$  Hz. IV ativan '2mg'$  was administered at 1829 on 10/02/2022. After 1831, EEG improved significantly. 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 Subsequently EEG showed posterior dominant rhythm of '7Hz'$  activity of moderate voltage (25-35 uV) seen predominantly in posterior head regions, symmetric and reactive to eye opening and eye closing. EEG also showed intermittent generalized 3-'7hz'$  theta-delta slowing. Hyperventilation and photic stimulation were not performed.    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 IV ativan '2mg'$  [place] after which status epilepticus resolved. . Subsequently EEG was suggestive of moderate diffuse encephalopathy, nonspecific etiology.   PLora [name]  EEG adult  Result Date: 10/02/2022 YLora Havens MD     10/02/2022  7:50 PM Patient  Name: RAseneth HarralsonMRN: [id number] Attending: PLora HavensReferring Physician/Provider: AAmie Portland MD Date: 10/02/2022 Duration: 27.03 mins Patient history: 70yoF with ams. EEG to evaluate for seizure Level of alertness: Awake AEDs during EEG study: None Technical aspects: This EEG study  was done with scalp electrodes positioned according to the 10-20 International system of electrode placement. Electrical activity was reviewed with band pass filter of 1-'70Hz'$ , sensitivity of 7 uV/mm, display speed of 58m/sec with a '60Hz'$  notched filter applied as appropriate. EEG data were recorded continuously and digitally stored.  Video monitoring was available and reviewed as appropriate. Description: EEG showed continuous generalized 3 to 6 Hz theta-delta slowing. Generalized periodic discharges with triphasic morphology were noted with fluctuating frequency of 1.5 to '3hz'$  Hz. Hyperventilation and photic stimulation were not performed.   ABNORMALITY - Periodic discharges with triphasic morphology, generalized ( GPDs) - Continuous slow, generalized IMPRESSION: This study showed generalized periodic discharges with triphasic morphology with fluctuating frequency of 1.5 to '3hz'$  Hz. This eeg pattern is on the ictal-interictal continuum with high suspicion for ictal nature due to frequency of '3hz'$ . Recommend ativan challenga and long term monitoring. Additionally there is moderate to severe diffuse encephalopathy, nonspecific etiology Dr. ARory Percywas notified. PLora [name]  CT Head Wo Contrast  Result Date: 10/01/2022 CLINICAL DATA:  Mental status change, unknown cause EXAM: CT HEAD WITHOUT CONTRAST TECHNIQUE: Contiguous axial images were obtained from the base of the skull through the vertex without intravenous contrast. RADIATION DOSE REDUCTION: This exam was performed according to the departmental dose-optimization program which includes automated exposure control, adjustment of the mA and/or kV according to  patient size and/or use of iterative reconstruction technique. COMPARISON:  CT examination dated September 27, 2022 FINDINGS: Brain: No evidence of acute infarction, hemorrhage, hydrocephalus, extra-axial collection or mass lesion/mass effect. Cerebral atrophy and chronic microvascular ischemic changes of the white matter, unchanged. Vascular: No hyperdense vessel or unexpected calcification. Skull: Normal. Negative for fracture or focal lesion. Sinuses/Orbits: No acute finding. Other: None. IMPRESSION: 1. No acute intracranial abnormality. 2. Cerebral atrophy and chronic microvascular ischemic changes of the white matter, unchanged. Electronically Signed   By: IKeane PoliceD.O.   On: 10/01/2022 12:50   DG Chest Port 1 View  Result Date: 10/01/2022 CLINICAL DATA:  Sepsis. EXAM: PORTABLE CHEST 1 VIEW COMPARISON:  09/27/2022 FINDINGS: The cardiac silhouette, mediastinal and hilar contours are normal. Stable aortic calcifications. 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 Left coronary artery stent is noted. Left PICC line tip is in good position in the distal SVC. The lungs are clear of an acute process. No infiltrates or effusions. The bony thorax is intact. IMPRESSION: No acute cardiopulmonary findings. Electronically Signed   By: PMarijo SanesM.D.   On: 10/01/2022 12:34    Microbiology: Results for orders placed or performed during the hospital encounter of 10/01/22  Blood Culture (routine x 2)     Status: None   Collection Time: 10/01/22  1:00 PM   Specimen: BLOOD  Result Value Ref Range Status   Specimen Description BLOOD RIGHT ANTECUBITAL  Final   Special Requests   Final    BOTTLES DRAWN AEROBIC AND ANAEROBIC Blood Culture results may not be optimal due to an excessive volume of blood received in culture bottles   Culture   Final    NO GROWTH 5 DAYS Performed at [facility] [address], [place] [address]   Report Status 10/06/2022 FINAL  Final  Blood Culture (routine x 2)      Status: None   Collection Time: 10/01/22  1:13 PM   Specimen: BLOOD LEFT FOREARM  Result Value Ref Range Status   Specimen Description BLOOD LEFT FOREARM  Final   Special Requests  Final    BOTTLES DRAWN AEROBIC AND ANAEROBIC Blood Culture adequate volume   Culture   Final    NO GROWTH 5 DAYS Performed at [facility], [place] [address] [place], [place] [address]    Report Status 10/06/2022 FINAL  Final    Labs: CBC: Recent Labs  Lab 10/24/22 0818 10/25/22 0539 10/26/22 0517  WBC 8.0 5.5 4.7  HGB 11.5* 9.5* 10.1*  HCT 36.2 29.9* 32.3*  MCV 90.5 90.6 92.0  PLT 487* 419* [id number]*   Basic Metabolic Panel: Recent Labs  Lab 10/24/22 0818 10/25/22 0539 10/26/22 0517 10/31/22 0452  NA 140 141 137  --   K 3.4* 3.2* 4.4  --   CL 103 107 106  --   CO2 '26 25 25  '$ --   GLUCOSE 82 94 153*  --   BUN '9 12 17  '$ --   CREATININE 0.54 0.68 0.78 0.66  CALCIUM 9.3 8.9 8.8*  --   MG 1.7 1.8  --   --   PHOS 3.6 3.9  --   --    Liver Function Tests: Recent Labs  Lab 10/24/22 0818 10/25/22 0539  ALBUMIN 3.0* 2.5*   CBG: Recent Labs  Lab 10/30/22 0808 10/30/22 1131 10/30/22 1632 10/30/22 2050 10/31/22 0759  GLUCAP 155* 257* 241* 246* 165*    Discharge time spent: greater than 30 minutes.  Signed: Fritzi Mandes, MD [facility] 10/31/2022

## 2022-10-31 NOTE — TOC Transition Note (Signed)
Transition of Care [facility]) - CM/SW Discharge Note   Patient Details  Name: Heather [name] MRN: [id number] Date of Birth: 1951-06-27  Transition of Care [facility] CM/SW Contact:  Candie Chroman, LCSW Phone Number: 10/31/2022, 10:17 AM   Clinical Narrative:   Patient has orders to discharge to [facility] today. RN will call report to [phone] (Room 129A). EMS transport has been arranged and she is first on the list. No further concerns. CSW signing off.  Final next level of care: Skilled Nursing Facility Barriers to Discharge: Barriers Resolved   Patient Goals and CMS Choice CMS Medicare.gov Compare Post Acute Care list provided to:: Patient Choice offered to / list presented to : Patient, Adult Children  Discharge Placement     Existing PASRR number confirmed : 10/25/22          Patient chooses bed at: Other - please specify in the comment section below: [facility]) Patient to be transferred to facility by: EMS Name of family member notified: Mickle Plumb Patient and family notified of of transfer: 10/31/22  Discharge Plan and Services Additional resources added to the After Visit Summary for   In-house Referral: Clinical Social Work   Post Acute Care Choice: [place]                               Social Determinants of Health (SDOH) Interventions SDOH Screenings   Food Insecurity: No Food Insecurity (10/18/2022)  Recent Concern: [place] Present (08/11/2022)  Housing: Low Risk  (10/18/2022)  Transportation Needs: No Transportation Needs (10/18/2022)  Utilities: Not At Risk (10/18/2022)  Financial Resource Strain: Medium Risk (10/19/2018)  Physical Activity: Inactive (10/19/2018)  Social Connections: Unknown (10/19/2018)  Tobacco Use: High Risk (10/26/2022)     Readmission Risk Interventions    11/13/2020    2:27 PM  Readmission Risk Prevention Plan  Transportation Screening Complete  PCP or Specialist Appt  within 3-5 Days Complete  HRI or [place] Complete  Social Work Consult for [place] Planning/Counseling Complete  Palliative Care Screening Not Applicable  Medication Review Press photographer) Complete

## 2022-10-31 NOTE — Telephone Encounter (Signed)
Womb care check was completed.

## 2022-11-08 DIAGNOSIS — R7401 Elevation of levels of liver transaminase levels: Secondary | ICD-10-CM

## 2022-11-12 ENCOUNTER — Telehealth (INDEPENDENT_AMBULATORY_CARE_PROVIDER_SITE_OTHER): Payer: Self-pay

## 2022-11-12 NOTE — Telephone Encounter (Signed)
They need to contact Dr. Donzetta Matters or the wound care center as we have not seen the patient's most recent wounds.  Her care was not done by [place] but in  so we cannot give orders for this patient

## 2022-11-12 NOTE — Telephone Encounter (Signed)
Tabitha called for verbal orders from [place]. She would like to know if they can resume wound care to treat her groining . 2 times for 4 weeks, 1 time for 4 weeks, and 1 time PRN.  However, as seen in the last note in her chart, the  patient was admitted in the hospital and was seen by Dr. Donzetta Matters. He advised that she be seen by the wound clinic for treatment here at [facility] Please advise.   Orders are below   Discharge wound care           Pressure Injury 10/02/22 Ischial tuberosity Left Stage 2 -  Partial thickness loss of dermis presenting as a shallow open injury with a red, pink wound bed without slough. 1 cm x 2 cm x 0.1 cm      28 days  Pressure Injury 10/18/22 Sacrum Mid Stage 2 -  Partial thickness loss of dermis presenting as a shallow open injury with a red, pink wound bed without slough. pink open area in center    12 days  Wound Care Orders  Start   10/24/22 1140  Wound care  Every other day    Comments: Cleanse wounds with saline, pat dry Cut to fit silver hydrofiber Kellie Simmering # [phone]) and place in wound beds of the bilateral groin wounds, top with foam. Change every other day

## 2022-11-13 NOTE — Telephone Encounter (Signed)
Left massage for Heather [name] to return my call

## 2022-11-13 NOTE — Telephone Encounter (Signed)
Heather [name] from [place] return call to the office and was notified with medical recommendations.

## 2023-01-13 ENCOUNTER — Other Ambulatory Visit: Payer: Self-pay

## 2023-01-13 ENCOUNTER — Emergency Department: Payer: Medicare HMO

## 2023-01-13 ENCOUNTER — Inpatient Hospital Stay: Payer: Medicare HMO

## 2023-01-13 ENCOUNTER — Inpatient Hospital Stay
Admission: EM | Admit: 2023-01-13 | Discharge: 2023-01-24 | DRG: 870 | Disposition: E | Payer: Medicare HMO | Attending: Student in an Organized Health Care Education/Training Program | Admitting: Student in an Organized Health Care Education/Training Program

## 2023-01-13 DIAGNOSIS — F209 Schizophrenia, unspecified: Secondary | ICD-10-CM | POA: Diagnosis present

## 2023-01-13 DIAGNOSIS — I5030 Unspecified diastolic (congestive) heart failure: Secondary | ICD-10-CM | POA: Diagnosis not present

## 2023-01-13 DIAGNOSIS — Z1152 Encounter for screening for COVID-19: Secondary | ICD-10-CM

## 2023-01-13 DIAGNOSIS — E1142 Type 2 diabetes mellitus with diabetic polyneuropathy: Secondary | ICD-10-CM | POA: Diagnosis present

## 2023-01-13 DIAGNOSIS — I70222 Atherosclerosis of native arteries of extremities with rest pain, left leg: Secondary | ICD-10-CM | POA: Diagnosis present

## 2023-01-13 DIAGNOSIS — E785 Hyperlipidemia, unspecified: Secondary | ICD-10-CM | POA: Diagnosis present

## 2023-01-13 DIAGNOSIS — J189 Pneumonia, unspecified organism: Secondary | ICD-10-CM

## 2023-01-13 DIAGNOSIS — R6521 Severe sepsis with septic shock: Secondary | ICD-10-CM | POA: Diagnosis present

## 2023-01-13 DIAGNOSIS — J439 Emphysema, unspecified: Secondary | ICD-10-CM | POA: Diagnosis not present

## 2023-01-13 DIAGNOSIS — J9601 Acute respiratory failure with hypoxia: Secondary | ICD-10-CM | POA: Diagnosis present

## 2023-01-13 DIAGNOSIS — Z515 Encounter for palliative care: Secondary | ICD-10-CM | POA: Diagnosis not present

## 2023-01-13 DIAGNOSIS — G309 Alzheimer's disease, unspecified: Secondary | ICD-10-CM | POA: Diagnosis present

## 2023-01-13 DIAGNOSIS — I10 Essential (primary) hypertension: Secondary | ICD-10-CM | POA: Diagnosis present

## 2023-01-13 DIAGNOSIS — N179 Acute kidney failure, unspecified: Secondary | ICD-10-CM | POA: Diagnosis present

## 2023-01-13 DIAGNOSIS — F17210 Nicotine dependence, cigarettes, uncomplicated: Secondary | ICD-10-CM | POA: Diagnosis not present

## 2023-01-13 DIAGNOSIS — Z955 Presence of coronary angioplasty implant and graft: Secondary | ICD-10-CM

## 2023-01-13 DIAGNOSIS — Z9842 Cataract extraction status, left eye: Secondary | ICD-10-CM

## 2023-01-13 DIAGNOSIS — I2510 Atherosclerotic heart disease of native coronary artery without angina pectoris: Secondary | ICD-10-CM | POA: Diagnosis present

## 2023-01-13 DIAGNOSIS — K551 Chronic vascular disorders of intestine: Secondary | ICD-10-CM | POA: Diagnosis present

## 2023-01-13 DIAGNOSIS — I998 Other disorder of circulatory system: Secondary | ICD-10-CM | POA: Diagnosis not present

## 2023-01-13 DIAGNOSIS — Z66 Do not resuscitate: Secondary | ICD-10-CM | POA: Diagnosis not present

## 2023-01-13 DIAGNOSIS — Z9049 Acquired absence of other specified parts of digestive tract: Secondary | ICD-10-CM

## 2023-01-13 DIAGNOSIS — E440 Moderate protein-calorie malnutrition: Secondary | ICD-10-CM | POA: Diagnosis present

## 2023-01-13 DIAGNOSIS — E1151 Type 2 diabetes mellitus with diabetic peripheral angiopathy without gangrene: Secondary | ICD-10-CM | POA: Diagnosis present

## 2023-01-13 DIAGNOSIS — R4182 Altered mental status, unspecified: Secondary | ICD-10-CM | POA: Diagnosis not present

## 2023-01-13 DIAGNOSIS — B952 Enterococcus as the cause of diseases classified elsewhere: Secondary | ICD-10-CM | POA: Diagnosis present

## 2023-01-13 DIAGNOSIS — I110 Hypertensive heart disease with heart failure: Secondary | ICD-10-CM | POA: Diagnosis present

## 2023-01-13 DIAGNOSIS — F0390 Unspecified dementia without behavioral disturbance: Secondary | ICD-10-CM | POA: Diagnosis not present

## 2023-01-13 DIAGNOSIS — G9341 Metabolic encephalopathy: Secondary | ICD-10-CM | POA: Diagnosis present

## 2023-01-13 DIAGNOSIS — M5030 Other cervical disc degeneration, unspecified cervical region: Secondary | ICD-10-CM | POA: Diagnosis present

## 2023-01-13 DIAGNOSIS — I739 Peripheral vascular disease, unspecified: Secondary | ICD-10-CM

## 2023-01-13 DIAGNOSIS — R230 Cyanosis: Secondary | ICD-10-CM | POA: Diagnosis present

## 2023-01-13 DIAGNOSIS — I2489 Other forms of acute ischemic heart disease: Secondary | ICD-10-CM | POA: Diagnosis present

## 2023-01-13 DIAGNOSIS — Z7902 Long term (current) use of antithrombotics/antiplatelets: Secondary | ICD-10-CM

## 2023-01-13 DIAGNOSIS — A4181 Sepsis due to Enterococcus: Principal | ICD-10-CM | POA: Diagnosis present

## 2023-01-13 DIAGNOSIS — F0284 Dementia in other diseases classified elsewhere, unspecified severity, with anxiety: Secondary | ICD-10-CM | POA: Diagnosis present

## 2023-01-13 DIAGNOSIS — Z953 Presence of xenogenic heart valve: Secondary | ICD-10-CM | POA: Diagnosis not present

## 2023-01-13 DIAGNOSIS — Z794 Long term (current) use of insulin: Secondary | ICD-10-CM

## 2023-01-13 DIAGNOSIS — Z7189 Other specified counseling: Secondary | ICD-10-CM

## 2023-01-13 DIAGNOSIS — Z818 Family history of other mental and behavioral disorders: Secondary | ICD-10-CM

## 2023-01-13 DIAGNOSIS — Z9851 Tubal ligation status: Secondary | ICD-10-CM

## 2023-01-13 DIAGNOSIS — E874 Mixed disorder of acid-base balance: Secondary | ICD-10-CM | POA: Diagnosis present

## 2023-01-13 DIAGNOSIS — Z681 Body mass index (BMI) 19 or less, adult: Secondary | ICD-10-CM | POA: Diagnosis not present

## 2023-01-13 DIAGNOSIS — J449 Chronic obstructive pulmonary disease, unspecified: Secondary | ICD-10-CM | POA: Diagnosis present

## 2023-01-13 DIAGNOSIS — E861 Hypovolemia: Secondary | ICD-10-CM | POA: Diagnosis present

## 2023-01-13 DIAGNOSIS — I5032 Chronic diastolic (congestive) heart failure: Secondary | ICD-10-CM | POA: Diagnosis present

## 2023-01-13 DIAGNOSIS — J9602 Acute respiratory failure with hypercapnia: Secondary | ICD-10-CM | POA: Diagnosis present

## 2023-01-13 DIAGNOSIS — J440 Chronic obstructive pulmonary disease with acute lower respiratory infection: Secondary | ICD-10-CM | POA: Diagnosis present

## 2023-01-13 DIAGNOSIS — F319 Bipolar disorder, unspecified: Secondary | ICD-10-CM | POA: Diagnosis present

## 2023-01-13 DIAGNOSIS — Z5941 Food insecurity: Secondary | ICD-10-CM

## 2023-01-13 DIAGNOSIS — F0283 Dementia in other diseases classified elsewhere, unspecified severity, with mood disturbance: Secondary | ICD-10-CM | POA: Diagnosis present

## 2023-01-13 DIAGNOSIS — Z5986 Financial insecurity: Secondary | ICD-10-CM

## 2023-01-13 DIAGNOSIS — E1110 Type 2 diabetes mellitus with ketoacidosis without coma: Secondary | ICD-10-CM

## 2023-01-13 DIAGNOSIS — I252 Old myocardial infarction: Secondary | ICD-10-CM

## 2023-01-13 DIAGNOSIS — Z89512 Acquired absence of left leg below knee: Secondary | ICD-10-CM

## 2023-01-13 DIAGNOSIS — Z85828 Personal history of other malignant neoplasm of skin: Secondary | ICD-10-CM

## 2023-01-13 DIAGNOSIS — Z885 Allergy status to narcotic agent status: Secondary | ICD-10-CM

## 2023-01-13 DIAGNOSIS — Z79899 Other long term (current) drug therapy: Secondary | ICD-10-CM

## 2023-01-13 DIAGNOSIS — G9340 Encephalopathy, unspecified: Secondary | ICD-10-CM | POA: Diagnosis not present

## 2023-01-13 DIAGNOSIS — Z882 Allergy status to sulfonamides status: Secondary | ICD-10-CM

## 2023-01-13 DIAGNOSIS — Z7401 Bed confinement status: Secondary | ICD-10-CM

## 2023-01-13 DIAGNOSIS — Z89612 Acquired absence of left leg above knee: Secondary | ICD-10-CM

## 2023-01-13 DIAGNOSIS — Z91013 Allergy to seafood: Secondary | ICD-10-CM

## 2023-01-13 DIAGNOSIS — E1310 Other specified diabetes mellitus with ketoacidosis without coma: Principal | ICD-10-CM

## 2023-01-13 DIAGNOSIS — K219 Gastro-esophageal reflux disease without esophagitis: Secondary | ICD-10-CM | POA: Diagnosis present

## 2023-01-13 DIAGNOSIS — I700 Atherosclerosis of aorta: Secondary | ICD-10-CM | POA: Diagnosis present

## 2023-01-13 DIAGNOSIS — Z9841 Cataract extraction status, right eye: Secondary | ICD-10-CM

## 2023-01-13 DIAGNOSIS — Z91041 Radiographic dye allergy status: Secondary | ICD-10-CM

## 2023-01-13 DIAGNOSIS — E0811 Diabetes mellitus due to underlying condition with ketoacidosis with coma: Secondary | ICD-10-CM | POA: Diagnosis not present

## 2023-01-13 LAB — CBG MONITORING, ED
Glucose-Capillary: 478 mg/dL — ABNORMAL HIGH (ref 70–99)
Glucose-Capillary: 485 mg/dL — ABNORMAL HIGH (ref 70–99)
Glucose-Capillary: 336 mg/dL — ABNORMAL HIGH (ref 70–99)
Glucose-Capillary: 322 mg/dL — ABNORMAL HIGH (ref 70–99)
Glucose-Capillary: 267 mg/dL — ABNORMAL HIGH (ref 70–99)
Glucose-Capillary: 198 mg/dL — ABNORMAL HIGH (ref 70–99)
Glucose-Capillary: 232 mg/dL — ABNORMAL HIGH (ref 70–99)
Glucose-Capillary: 218 mg/dL — ABNORMAL HIGH (ref 70–99)
Glucose-Capillary: 233 mg/dL — ABNORMAL HIGH (ref 70–99)
Glucose-Capillary: 222 mg/dL — ABNORMAL HIGH (ref 70–99)
Glucose-Capillary: 247 mg/dL — ABNORMAL HIGH (ref 70–99)

## 2023-01-13 LAB — BASIC METABOLIC PANEL
Anion gap: 12 (ref 5–15)
BUN: 19 mg/dL (ref 8–23)
BUN: 20 mg/dL (ref 8–23)
BUN: 15 mg/dL (ref 8–23)
CO2: <7 mmol/L — ABNORMAL LOW (ref 22–32)
CO2: <7 mmol/L — ABNORMAL LOW (ref 22–32)
CO2: 26 mmol/L (ref 22–32)
Calcium: 7.8 mg/dL — ABNORMAL LOW (ref 8.9–10.3)
Calcium: 7.7 mg/dL — ABNORMAL LOW (ref 8.9–10.3)
Calcium: 7 mg/dL — ABNORMAL LOW (ref 8.9–10.3)
Chloride: 113 mmol/L — ABNORMAL HIGH (ref 98–111)
Chloride: 113 mmol/L — ABNORMAL HIGH (ref 98–111)
Chloride: 100 mmol/L (ref 98–111)
Creatinine, Ser: 0.86 mg/dL (ref 0.44–1.00)
Creatinine, Ser: 0.71 mg/dL (ref 0.44–1.00)
Creatinine, Ser: 0.55 mg/dL (ref 0.44–1.00)
GFR, Estimated: >60 mL/min (ref >60)
GFR, Estimated: >60 mL/min (ref >60)
GFR, Estimated: >60 mL/min (ref >60)
Glucose, Bld: 377 mg/dL — ABNORMAL HIGH (ref 70–99)
Glucose, Bld: 268 mg/dL — ABNORMAL HIGH (ref 70–99)
Glucose, Bld: 265 mg/dL — ABNORMAL HIGH (ref 70–99)
Potassium: 4.3 mmol/L (ref 3.5–5.1)
Potassium: 5 mmol/L (ref 3.5–5.1)
Potassium: 3 mmol/L — ABNORMAL LOW (ref 3.5–5.1)
Sodium: 138 mmol/L (ref 135–145)
Sodium: 137 mmol/L (ref 135–145)
Sodium: 138 mmol/L (ref 135–145)

## 2023-01-13 LAB — BLOOD GAS, VENOUS
Acid-base deficit: 23.3 mmol/L — ABNORMAL HIGH (ref 0.0–2.0)
Bicarbonate: 3.1 mmol/L — ABNORMAL LOW (ref 20.0–28.0)
O2 Saturation: 87.3 %
Patient temperature: 37
pCO2, Ven: <18 mmHg — CL (ref 44–60)
pH, Ven: 7.14 — CL (ref 7.25–7.43)
pO2, Ven: 61 mmHg — ABNORMAL HIGH (ref 32–45)

## 2023-01-13 LAB — CBC WITH DIFFERENTIAL/PLATELET
Abs Immature Granulocytes: 0.67 10*3/uL — ABNORMAL HIGH (ref 0.00–0.07)
Basophils Absolute: 0.2 10*3/uL — ABNORMAL HIGH (ref 0.0–0.1)
Basophils Relative: 1 %
Eosinophils Absolute: 0 10*3/uL (ref 0.0–0.5)
Eosinophils Relative: 0 %
HCT: 46.9 % — ABNORMAL HIGH (ref 36.0–46.0)
Hemoglobin: 14.4 g/dL (ref 12.0–15.0)
Immature Granulocytes: 3 %
Lymphocytes Relative: 7 %
Lymphs Abs: 1.5 10*3/uL (ref 0.7–4.0)
MCH: 27.7 pg (ref 26.0–34.0)
MCHC: 30.7 g/dL (ref 30.0–36.0)
MCV: 90.2 fL (ref 80.0–100.0)
Monocytes Absolute: 1.5 10*3/uL — ABNORMAL HIGH (ref 0.1–1.0)
Monocytes Relative: 7 %
Neutro Abs: 17.7 10*3/uL — ABNORMAL HIGH (ref 1.7–7.7)
Neutrophils Relative %: 82 %
Platelets: 337 10*3/uL (ref 150–400)
RBC: 5.2 MIL/uL — ABNORMAL HIGH (ref 3.87–5.11)
RDW: 15.1 % (ref 11.5–15.5)
WBC: 21.6 10*3/uL — ABNORMAL HIGH (ref 4.0–10.5)
nRBC: 0 % (ref 0.0–0.2)

## 2023-01-13 LAB — COMPREHENSIVE METABOLIC PANEL
ALT: 18 U/L (ref 0–44)
AST: 23 U/L (ref 15–41)
Albumin: 2.8 g/dL — ABNORMAL LOW (ref 3.5–5.0)
Alkaline Phosphatase: 104 U/L (ref 38–126)
BUN: 21 mg/dL (ref 8–23)
CO2: <7 mmol/L — ABNORMAL LOW (ref 22–32)
Calcium: 8.8 mg/dL — ABNORMAL LOW (ref 8.9–10.3)
Chloride: 103 mmol/L (ref 98–111)
Creatinine, Ser: 1.07 mg/dL — ABNORMAL HIGH (ref 0.44–1.00)
GFR, Estimated: 56 mL/min — ABNORMAL LOW (ref >60)
Glucose, Bld: 605 mg/dL (ref 70–99)
Potassium: 3.2 mmol/L — ABNORMAL LOW (ref 3.5–5.1)
Sodium: 136 mmol/L (ref 135–145)
Total Bilirubin: 2.3 mg/dL — ABNORMAL HIGH (ref 0.3–1.2)
Total Protein: 6.3 g/dL — ABNORMAL LOW (ref 6.5–8.1)

## 2023-01-13 LAB — BLOOD GAS, ARTERIAL
Acid-Base Excess: 4.2 mmol/L — ABNORMAL HIGH (ref 0.0–2.0)
Bicarbonate: 27.3 mmol/L (ref 20.0–28.0)
FIO2: 40 %
MECHVT: 450 mL
Mechanical Rate: 20
O2 Saturation: 99.2 %
PEEP: 5 cmH2O
Patient temperature: 37
pCO2 arterial: 35 mmHg (ref 32–48)
pH, Arterial: 7.5 — ABNORMAL HIGH (ref 7.35–7.45)
pO2, Arterial: 127 mmHg — ABNORMAL HIGH (ref 83–108)

## 2023-01-13 LAB — URINALYSIS, COMPLETE (UACMP) WITH MICROSCOPIC
Bacteria, UA: NONE SEEN
Bilirubin Urine: NEGATIVE
Glucose, UA: >=500 mg/dL — AB
Ketones, ur: 80 mg/dL — AB
Leukocytes,Ua: NEGATIVE
Nitrite: NEGATIVE
Protein, ur: 30 mg/dL — AB
Specific Gravity, Urine: 1.018 (ref 1.005–1.030)
pH: 5 (ref 5.0–8.0)

## 2023-01-13 LAB — PROTIME-INR
INR: 1.3 — ABNORMAL HIGH (ref 0.8–1.2)
Prothrombin Time: 16.1 seconds — ABNORMAL HIGH (ref 11.4–15.2)

## 2023-01-13 LAB — APTT: aPTT: 27 seconds (ref 24–36)

## 2023-01-13 LAB — SARS CORONAVIRUS 2 BY RT PCR: SARS Coronavirus 2 by RT PCR: NEGATIVE

## 2023-01-13 LAB — TROPONIN I (HIGH SENSITIVITY)
Troponin I (High Sensitivity): 1356 ng/L (ref <18)
Troponin I (High Sensitivity): 2875 ng/L (ref <18)

## 2023-01-13 LAB — GLUCOSE, CAPILLARY: Glucose-Capillary: 230 mg/dL — ABNORMAL HIGH (ref 70–99)

## 2023-01-13 LAB — HEPARIN LEVEL (UNFRACTIONATED)
Heparin Unfractionated: <0.1 IU/mL — ABNORMAL LOW (ref 0.30–0.70)
Heparin Unfractionated: >1.1 IU/mL — ABNORMAL HIGH (ref 0.30–0.70)
Heparin Unfractionated: 0.54 IU/mL (ref 0.30–0.70)

## 2023-01-13 LAB — PHOSPHORUS: Phosphorus: 3.3 mg/dL (ref 2.5–4.6)

## 2023-01-13 LAB — BETA-HYDROXYBUTYRIC ACID: Beta-Hydroxybutyric Acid: >8 mmol/L — ABNORMAL HIGH (ref 0.05–0.27)

## 2023-01-13 LAB — LACTIC ACID, PLASMA
Lactic Acid, Venous: 2.9 mmol/L (ref 0.5–1.9)
Lactic Acid, Venous: 2.5 mmol/L (ref 0.5–1.9)

## 2023-01-13 LAB — MAGNESIUM: Magnesium: 1.5 mg/dL — ABNORMAL LOW (ref 1.7–2.4)

## 2023-01-13 MED ORDER — ONDANSETRON HCL 4 MG/2ML IJ SOLN: 4.0000 mg | Freq: Four times a day (QID) | INTRAMUSCULAR | Status: DC | PRN

## 2023-01-13 MED ORDER — FENTANYL 2500MCG IN NS 250ML (10MCG/ML) PREMIX INFUSION
25.0000 ug/h | INTRAVENOUS | Status: DC
  Administered 2023-01-13: 25 ug/h via INTRAVENOUS
  Administered 2023-01-13: 50 ug/h via INTRAVENOUS
  Administered 2023-01-14: 100 ug/h via INTRAVENOUS
  Administered 2023-01-16: 75 ug/h via INTRAVENOUS
  Administered 2023-01-16 – 2023-01-17 (×2): 125 ug/h via INTRAVENOUS
  Administered 2023-01-17 – 2023-01-18 (×2): 175 ug/h via INTRAVENOUS
  Administered 2023-01-18: 200 ug/h via INTRAVENOUS
  Administered 2023-01-19 – 2023-01-20 (×4): 250 ug/h via INTRAVENOUS
  Filled 2023-01-13 (×11): qty 250

## 2023-01-13 MED ORDER — SODIUM BICARBONATE 8.4 % IV SOLN
150.0000 meq | Freq: Once | INTRAVENOUS | Status: AC
  Administered 2023-01-13: 150 meq via INTRAVENOUS

## 2023-01-13 MED ORDER — PIPERACILLIN-TAZOBACTAM 3.375 G IVPB
3.3750 g | Freq: Three times a day (TID) | INTRAVENOUS | Status: DC
  Administered 2023-01-13 – 2023-01-17 (×11): 3.375 g via INTRAVENOUS
  Filled 2023-01-13 (×12): qty 50

## 2023-01-13 MED ORDER — ACETAMINOPHEN 650 MG RE SUPP: 650.0000 mg | Freq: Four times a day (QID) | RECTAL | Status: DC | PRN

## 2023-01-13 MED ORDER — ACETAMINOPHEN 325 MG PO TABS
650.0000 mg | ORAL_TABLET | Freq: Four times a day (QID) | ORAL | Status: DC | PRN
  Administered 2023-01-15 – 2023-01-18 (×4): 650 mg via ORAL
  Filled 2023-01-13 (×5): qty 2

## 2023-01-13 MED ORDER — DIPHENHYDRAMINE HCL 25 MG PO CAPS: 50.0000 mg | ORAL_CAPSULE | Freq: Once | ORAL | Status: AC

## 2023-01-13 MED ORDER — FENTANYL CITRATE PF 50 MCG/ML IJ SOSY
25.0000 ug | PREFILLED_SYRINGE | Freq: Once | INTRAMUSCULAR | Status: AC
  Administered 2023-01-13: 25 ug via INTRAVENOUS
  Filled 2023-01-13: qty 1

## 2023-01-13 MED ORDER — HEPARIN (PORCINE) 25000 UT/250ML-% IV SOLN
750.0000 [IU]/h | INTRAVENOUS | Status: DC
  Administered 2023-01-13: 750 [IU]/h via INTRAVENOUS
  Filled 2023-01-13: qty 250

## 2023-01-13 MED ORDER — ROCURONIUM BROMIDE 10 MG/ML (PF) SYRINGE
50.0000 mg | PREFILLED_SYRINGE | Freq: Once | INTRAVENOUS | Status: AC
  Administered 2023-01-13: 50 mg via INTRAVENOUS
  Filled 2023-01-13: qty 10

## 2023-01-13 MED ORDER — DIPHENHYDRAMINE HCL 50 MG/ML IJ SOLN
50.0000 mg | Freq: Once | INTRAMUSCULAR | Status: AC
  Administered 2023-01-13: 50 mg via INTRAVENOUS
  Filled 2023-01-13: qty 1

## 2023-01-13 MED ORDER — FENTANYL CITRATE PF 50 MCG/ML IJ SOSY
50.0000 ug | PREFILLED_SYRINGE | Freq: Once | INTRAMUSCULAR | Status: AC
  Administered 2023-01-13: 50 ug via INTRAVENOUS
  Filled 2023-01-13: qty 1

## 2023-01-13 MED ORDER — LACTATED RINGERS IV SOLN: INTRAVENOUS | Status: DC

## 2023-01-13 MED ORDER — SODIUM CHLORIDE 0.9 % IV SOLN
2.0000 g | Freq: Once | INTRAVENOUS | Status: AC
  Administered 2023-01-13: 2 g via INTRAVENOUS
  Filled 2023-01-13: qty 12.5

## 2023-01-13 MED ORDER — SODIUM CHLORIDE 0.9 % IV BOLUS
1000.0000 mL | Freq: Once | INTRAVENOUS | Status: AC
  Administered 2023-01-13: 1000 mL via INTRAVENOUS

## 2023-01-13 MED ORDER — THIAMINE HCL 100 MG/ML IJ SOLN
100.0000 mg | Freq: Every day | INTRAMUSCULAR | Status: DC
  Administered 2023-01-13 – 2023-01-18 (×6): 100 mg via INTRAVENOUS
  Filled 2023-01-13 (×6): qty 2

## 2023-01-13 MED ORDER — SODIUM CHLORIDE 0.45 % IV SOLN: INTRAVENOUS | Status: DC

## 2023-01-13 MED ORDER — VANCOMYCIN HCL 750 MG/150ML IV SOLN
750.0000 mg | INTRAVENOUS | Status: DC
  Administered 2023-01-14 – 2023-01-17 (×4): 750 mg via INTRAVENOUS
  Filled 2023-01-13 (×4): qty 150

## 2023-01-13 MED ORDER — SODIUM CHLORIDE 0.9 % IV SOLN: 1.0000 g | Freq: Three times a day (TID) | INTRAVENOUS | Status: DC

## 2023-01-13 MED ORDER — IPRATROPIUM-ALBUTEROL 0.5-2.5 (3) MG/3ML IN SOLN
3.0000 mL | Freq: Four times a day (QID) | RESPIRATORY_TRACT | Status: DC
  Administered 2023-01-13 – 2023-01-14 (×5): 3 mL via RESPIRATORY_TRACT
  Filled 2023-01-13 (×6): qty 3

## 2023-01-13 MED ORDER — FENTANYL BOLUS VIA INFUSION
25.0000 ug | INTRAVENOUS | Status: DC | PRN
  Administered 2023-01-14: 50 ug via INTRAVENOUS
  Administered 2023-01-16 (×4): 100 ug via INTRAVENOUS
  Administered 2023-01-19 (×2): 50 ug via INTRAVENOUS
  Administered 2023-01-20 (×2): 100 ug via INTRAVENOUS

## 2023-01-13 MED ORDER — SODIUM CHLORIDE 0.9% FLUSH
3.0000 mL | Freq: Two times a day (BID) | INTRAVENOUS | Status: DC
  Administered 2023-01-13 – 2023-01-18 (×11): 3 mL via INTRAVENOUS

## 2023-01-13 MED ORDER — POLYETHYLENE GLYCOL 3350 17 G PO PACK
17.0000 g | PACK | Freq: Every day | ORAL | Status: DC
  Administered 2023-01-13 – 2023-01-17 (×4): 17 g
  Filled 2023-01-13 (×4): qty 1

## 2023-01-13 MED ORDER — DEXTROSE 50 % IV SOLN
0.0000 mL | INTRAVENOUS | Status: DC | PRN
  Administered 2023-01-14 – 2023-01-16 (×3): 50 mL via INTRAVENOUS
  Filled 2023-01-13 (×3): qty 50

## 2023-01-13 MED ORDER — MAGNESIUM SULFATE 4 GM/100ML IV SOLN
4.0000 g | Freq: Once | INTRAVENOUS | Status: AC
  Administered 2023-01-13: 4 g via INTRAVENOUS
  Filled 2023-01-13: qty 100

## 2023-01-13 MED ORDER — POTASSIUM CHLORIDE 10 MEQ/100ML IV SOLN
10.0000 meq | INTRAVENOUS | Status: AC
  Administered 2023-01-13 (×4): 10 meq via INTRAVENOUS
  Filled 2023-01-13 (×4): qty 100

## 2023-01-13 MED ORDER — FOLIC ACID 5 MG/ML IJ SOLN
1.0000 mg | Freq: Every day | INTRAMUSCULAR | Status: AC
  Administered 2023-01-13 – 2023-01-18 (×6): 1 mg via INTRAVENOUS
  Filled 2023-01-13 (×6): qty 0.2

## 2023-01-13 MED ORDER — ETOMIDATE 2 MG/ML IV SOLN
20.0000 mg | Freq: Once | INTRAVENOUS | Status: AC
  Administered 2023-01-13: 20 mg via INTRAVENOUS
  Filled 2023-01-13: qty 10

## 2023-01-13 MED ORDER — SODIUM BICARBONATE 8.4 % IV SOLN: 50.0000 meq | Freq: Once | INTRAVENOUS | Status: DC

## 2023-01-13 MED ORDER — IOHEXOL 350 MG/ML SOLN
100.0000 mL | Freq: Once | INTRAVENOUS | Status: AC | PRN
  Administered 2023-01-13: 100 mL via INTRAVENOUS

## 2023-01-13 MED ORDER — HEPARIN (PORCINE) 25000 UT/250ML-% IV SOLN
650.0000 [IU]/h | INTRAVENOUS | Status: DC
  Administered 2023-01-13 – 2023-01-15 (×2): 550 [IU]/h via INTRAVENOUS
  Administered 2023-01-16: 650 [IU]/h via INTRAVENOUS
  Filled 2023-01-13 (×2): qty 250

## 2023-01-13 MED ORDER — LACTATED RINGERS IV BOLUS: 20.0000 mL/kg | Freq: Once | INTRAVENOUS | Status: DC

## 2023-01-13 MED ORDER — VANCOMYCIN HCL IN DEXTROSE 1-5 GM/200ML-% IV SOLN
1000.0000 mg | Freq: Once | INTRAVENOUS | Status: AC
  Administered 2023-01-13: 1000 mg via INTRAVENOUS
  Filled 2023-01-13: qty 200

## 2023-01-13 MED ORDER — LACTATED RINGERS IV BOLUS
1000.0000 mL | Freq: Once | INTRAVENOUS | Status: AC
  Administered 2023-01-13: 1000 mL via INTRAVENOUS

## 2023-01-13 MED ORDER — CHLORHEXIDINE GLUCONATE CLOTH 2 % EX PADS
6.0000 | MEDICATED_PAD | Freq: Every day | CUTANEOUS | Status: DC
  Administered 2023-01-14 – 2023-01-20 (×6): 6 via TOPICAL

## 2023-01-13 MED ORDER — DOCUSATE SODIUM 50 MG/5ML PO LIQD
100.0000 mg | Freq: Two times a day (BID) | ORAL | Status: DC
  Administered 2023-01-14 – 2023-01-17 (×6): 100 mg
  Filled 2023-01-13 (×6): qty 10

## 2023-01-13 MED ORDER — SODIUM CHLORIDE 0.9 % IV SOLN
500.0000 mg | INTRAVENOUS | Status: DC
  Administered 2023-01-13: 500 mg via INTRAVENOUS
  Filled 2023-01-13: qty 5

## 2023-01-13 MED ORDER — INSULIN REGULAR(HUMAN) IN NACL 100-0.9 UT/100ML-% IV SOLN
INTRAVENOUS | Status: DC
  Administered 2023-01-13: 7.5 [IU]/h via INTRAVENOUS
  Filled 2023-01-13 (×2): qty 100

## 2023-01-13 MED ORDER — SODIUM CHLORIDE 0.9 % IV SOLN: 2.0000 g | INTRAVENOUS | Status: DC

## 2023-01-13 MED ORDER — METHYLPREDNISOLONE SODIUM SUCC 40 MG IJ SOLR
40.0000 mg | Freq: Once | INTRAMUSCULAR | Status: AC
  Administered 2023-01-13: 40 mg via INTRAVENOUS
  Filled 2023-01-13: qty 1

## 2023-01-13 MED ORDER — HEPARIN BOLUS VIA INFUSION
3000.0000 [IU] | Freq: Once | INTRAVENOUS | Status: AC
  Administered 2023-01-13: 3000 [IU] via INTRAVENOUS
  Filled 2023-01-13: qty 3000

## 2023-01-13 MED ORDER — ONDANSETRON HCL 4 MG PO TABS: 4.0000 mg | ORAL_TABLET | Freq: Four times a day (QID) | ORAL | Status: DC | PRN

## 2023-01-13 MED ORDER — DEXTROSE IN LACTATED RINGERS 5 % IV SOLN: INTRAVENOUS | Status: DC

## 2023-01-13 MED ORDER — STERILE WATER FOR INJECTION IV SOLN
INTRAVENOUS | Status: DC
  Filled 2023-01-13: qty 150
  Filled 2023-01-13: qty 1000
  Filled 2023-01-13 (×2): qty 150
  Filled 2023-01-13: qty 1000

## 2023-01-13 MED ORDER — SODIUM BICARBONATE 8.4 % IV SOLN
INTRAVENOUS | Status: AC
  Filled 2023-01-13: qty 150

## 2023-01-13 NOTE — Consult Note (Signed)
[facility] Vascular Consult Note  MRN : [id number]  Heather [name] is a 72 y.o. (August 11, 1951) female who presents with chief complaint of  Chief Complaint  Patient presents with   Tachycardia  .   Consulting Physician:Iulia Huel Cote, MD Reason for consult:PAD with critical limb ischemia and possible mesenteric stenosis  History of Present Illness: Heather [name] is a 72 year old female who presents to [facility] due to concern for weakness and confusion.  She has a previous medical history significant for peripheral arterial disease involving the lower extremities in addition to mesenteric ischemia.  She recently underwent significant revascularization earlier this year done with our [facility] group.  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  On physical exam it appears that the wounds are healed currently.  The patient presented to the emergency room due to having being sick and increased weakness and confusion over the last several weeks.  The patient is unable to provide any history or information and the majority comes from the husband who is also a poor historian.  On presentation the patient had some pale discoloration per the husband and she notes that she has not been removing it significantly.  Currently there are no open wounds or ulcerations present on the right lower extremity.  There was also some concern for possible mottling of the abdomen area.  Current Facility-Administered Medications  Medication Dose Route Frequency Provider Last Rate Last Admin   acetaminophen (TYLENOL) tablet 650 mg  650 mg Oral Q6H PRN Verdene Lennert, MD       Or   acetaminophen (TYLENOL) suppository 650 mg  650 mg Rectal Q6H PRN Verdene Lennert, MD       [START ON 01/14/2023] Chlorhexidine Gluconate Cloth 2 % PADS 6 each  6 each Topical Daily Ouma, Hubbard Hartshorn, NP       dextrose 5 % in lactated ringers infusion   Intravenous Continuous Phineas Semen,  MD 125 mL/hr at 01/13/23 1700 New Bag at 01/13/23 1700   dextrose 50 % solution 0-50 mL  0-50 mL Intravenous PRN Phineas Semen, MD       docusate (COLACE) 50 MG/5ML liquid 100 mg  100 mg Per Tube BID Erin Fulling, MD       fentaNYL (SUBLIMAZE) bolus via infusion 25-100 mcg  25-100 mcg Intravenous Q15 min PRN Erin Fulling, MD       fentaNYL in NS (21mcg/ml) infusion-PREMIX  25-200 mcg/hr Intravenous Continuous Erin Fulling, MD 10 mL/hr at 01/13/23 2224 100 mcg/hr at 01/13/23 2224   folic acid injection 1 mg  1 mg Intravenous Daily Verdene Lennert, MD   1 mg at 01/13/23 1415   heparin ADULT infusion 100 units/mL (25000 units/211mL)  550 Units/hr Intravenous Continuous Tressie Ellis, RPH 5.5 mL/hr at 01/13/23 1600 550 Units/hr at 01/13/23 1600   insulin regular, human (MYXREDLIN) 100 units/ 100 mL infusion   Intravenous Continuous Phineas Semen, MD 7.5 mL/hr at 01/13/23 2223 7.5 Units/hr at 01/13/23 2223   ipratropium-albuterol (DUONEB) 0.5-2.5 (3) MG/3ML nebulizer solution 3 mL  3 mL Nebulization Q6H Verdene Lennert, MD   3 mL at 01/13/23 2000   ondansetron (ZOFRAN) tablet 4 mg  4 mg Oral Q6H PRN Verdene Lennert, MD       Or   ondansetron (ZOFRAN) injection 4 mg  4 mg Intravenous Q6H PRN Verdene Lennert, MD       piperacillin-tazobactam (ZOSYN) IVPB 3.375 g  3.375 g Intravenous Q8H Jaynie Bream, [facility]  Stopped at 01/13/23 2327   polyethylene glycol (MIRALAX / GLYCOLAX) packet 17 g  17 g Per Tube Daily Erin Fulling, MD   17 g at 01/13/23 2021   sodium bicarbonate 150 mEq in sterile water 1,150 mL infusion   Intravenous Continuous Verdene Lennert, MD 150 mL/hr at 01/13/23 1642 New Bag at 01/13/23 1642   sodium chloride flush (NS) 0.9 % injection 3 mL  3 mL Intravenous Q12H Verdene Lennert, MD   3 mL at 01/13/23 2139   thiamine (VITAMIN B1) injection 100 mg  100 mg Intravenous Daily Verdene Lennert, MD   100 mg at 01/13/23 1415   [START ON 01/14/2023] vancomycin (VANCOREADY)  IVPB 750 mg/150 mL  750 mg Intravenous Q24H Jaynie Bream, [facility]        Past Medical History:  Diagnosis Date   (HFpEF) heart failure with preserved ejection fraction (HCC)    Angina pectoris (HCC)    Anxiety    Aortic atherosclerosis (HCC)    Basal cell carcinoma    Bilateral carotid artery disease (HCC)    a.) carotid doppler 03/18/2021: 1-39% RICA, 40-59% LICA   Bipolar disorder (HCC)    Cervicalgia    Chronic mesenteric ischemia (HCC)    a.) s/p PTA 11/13/2020 --> 6 x 16 mm lifestream ballon expanding stent to SMA   COPD (chronic obstructive pulmonary disease) (HCC)    Coronary artery disease 03/20/2014   a.) LHC/PCI 03/20/2014: 70% mLAD (2.5 x 28 mm Xience Alpine DES), 70% dLCx, 70% dRCA; b.) LHC 01/25/2016: 90% ISR mLAD --> mLAD dissection with in stent --> 2.75 x 22 mm Resolute DES; c.) R/LHC 09/29/2016: 50% ISR mLAD, 70% dLAD, mPA 37, PCWP 24, AO sat 90%, PA sat 46%, CO 3.7, CI 2.46 - med mgmt   DDD (degenerative disc disease), cervical    Depression    Diabetic polyneuropathy (HCC)    GERD (gastroesophageal reflux disease)    Hyperlipidemia    Hypertension    Long term current use of antithrombotics/antiplatelets    a.) on DAPT (ASA + clopidogrel)   Memory loss    NSTEMI (non-ST elevated myocardial infarction) (HCC) 07/2016   a.) troponins trended (normal < 0.034 ng/mL): 0.325 --> 1.480 --> 2.330 --> 1.660 --> 0.169 ng/mL   PAD (peripheral artery disease) (HCC)    a.) s/p PTA and stenting SMA 11/13/2020; b.) s/p PTA and stenting of RIGHT SFA and popliteal 04/12/2021; c.) s/p PTA and kissing balloon stents to BILATERAL CIAs 05/28/2021   Respiratory failure with hypoxia (HCC)    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   a.) 23 mm Sapien 3 via suprasternal approach   Schizophrenia (HCC)    Severe aortic stenosis    a.) s/p TAVR 09/30/2016; 23 mm Sapien 3 via a suprasternal approach   Type 2 diabetes mellitus treated with insulin (HCC)    Vertigo      Past Surgical History:  Procedure Laterality Date   AMPUTATION Left 08/14/2022   Procedure: ABOVE KNEE AMPUTATION;  Surgeon: Maeola Harman, MD;  Location: [facility] OR;  Service: Vascular;  Laterality: Left;   AORTIC VALVE REPLACEMENT  09/30/2016   Procedure: TRANSCATHETER AORTIC VALVE REPAIR   APPLICATION OF WOUND VAC Bilateral 08/14/2022   Procedure: APPLICATION OF WOUND VAC;  Surgeon: Maeola Harman, MD;  Location: [facility] OR;  Service: Vascular;  Laterality: Bilateral;   BLADDER SURGERY     CATARACT EXTRACTION, BILATERAL     CHOLECYSTECTOMY     COLONOSCOPY  CORONARY ANGIOPLASTY WITH STENT PLACEMENT  03/20/2014   Procedure: CORONARY ANGIOPLASTY WITH STENT PLACEMENT; Location: [facility]; Surgeon: Willene Hatchet, MD   CORONARY ANGIOPLASTY WITH STENT PLACEMENT Left 01/25/2016   Procedure: CORONARY ANGIOPLASTY WITH STENT PLACEMENT; Location: [facility]; Surgeon: Willene Hatchet, MD   ENDARTERECTOMY FEMORAL Bilateral 08/04/2022   Procedure: BILATERAL FEMORAL ENDARTERECTOMY;  Surgeon: Maeola Harman, MD;  Location: [facility] OR;  Service: Vascular;  Laterality: Bilateral;   FASCIECTOMY Bilateral 08/04/2022   Procedure: BILATERAL LOWER EXTREMITY FASCIECTOMIES;  Surgeon: Maeola Harman, MD;  Location: [facility] OR;  Service: Vascular;  Laterality: Bilateral;   FOOT SURGERY Right    GROIN DEBRIDEMENT Bilateral 08/14/2022   Procedure: GROIN DEBRIDEMENT;  Surgeon: Maeola Harman, MD;  Location: [facility] OR;  Service: Vascular;  Laterality: Bilateral;   GROIN DEBRIDEMENT Bilateral 09/16/2022   Procedure: GROIN DEBRIDEMENT POSSIBLE VAC PLACMENT;  Surgeon: Victorino Sparrow, MD;  Location: [facility] OR;  Service: Vascular;  Laterality: Bilateral;   HEMORRHOID SURGERY     INSERTION OF ILIAC STENT Bilateral 08/04/2022   Procedure: INSERTION OF BILATERAL ILIAC STENTS;  Surgeon: Maeola Harman, MD;  Location: [facility] OR;  Service: Vascular;  Laterality: Bilateral;   IRRIGATION AND  DEBRIDEMENT HEMATOMA Left 07/08/2014   GROIN   LOWER EXTREMITY ANGIOGRAM Bilateral 08/04/2022   Procedure: LOWER EXTREMITY ANGIOGRAM;  Surgeon: Maeola Harman, MD;  Location: [facility] OR;  Service: Vascular;  Laterality: Bilateral;   LOWER EXTREMITY ANGIOGRAPHY Right 04/12/2021   Procedure: Lower Extremity Angiography;  Surgeon: Renford Dills, MD;  Location: [facility] INVASIVE CV LAB;  Service: Cardiovascular;  Laterality: Right;   LOWER EXTREMITY ANGIOGRAPHY Left 05/28/2021   Procedure: LOWER EXTREMITY ANGIOGRAPHY;  Surgeon: Renford Dills, MD;  Location: [facility] INVASIVE CV LAB;  Service: Cardiovascular;  Laterality: Left;   LOWER EXTREMITY ANGIOGRAPHY Left 03/25/2022   Procedure: Lower Extremity Angiography;  Surgeon: Renford Dills, MD;  Location: [facility] INVASIVE CV LAB;  Service: Cardiovascular;  Laterality: Left;   PATCH ANGIOPLASTY  08/04/2022   Procedure: LEFT FEMORAL PATCH ANGIOPLASTY USING XENOSURE BIOLOGIC PATCH;  Surgeon: Maeola Harman, MD;  Location: [facility] OR;  Service: Vascular;;   PSEUDOANEURYSM REPAIR Left 06/30/2014   FEMORAL ARTERY   RIGHT/LEFT HEART CATH AND CORONARY ANGIOGRAPHY Bilateral 09/29/2016   Procedure: RIGHT/LEFT HEART CATH AND CORONARY ANGIOGRAPHY; Location [facility]; Surgeon: Willene Hatchet, MD   THROMBECTOMY FEMORAL ARTERY Bilateral 08/04/2022   Procedure: BILATERAL LOWER EXTREMITY THROMBECTOMY;  Surgeon: Maeola Harman, MD;  Location: [facility] OR;  Service: Vascular;  Laterality: Bilateral;   TUBAL LIGATION     VISCERAL ANGIOGRAPHY N/A 11/13/2020   Procedure: VISCERAL ANGIOGRAPHY;  Surgeon: Renford Dills, MD;  Location: [facility] INVASIVE CV LAB;  Service: Cardiovascular;  Laterality: N/A;    Social History Social History   Tobacco Use   Smoking status: Every Day    Packs/day: 1.50    Years: 48.00    Additional pack years: 0.00    Total pack years: 72.00    Types: Cigarettes   Smokeless tobacco: Never  Vaping Use   Vaping Use: Never  used  Substance Use Topics   Alcohol use: Not Currently   Drug use: Not Currently    Family History Family History  Problem Relation Age of Onset   Mental illness Mother    Breast cancer Neg Hx     Allergies  Allergen Reactions   Iodinated Contrast Media Hives   Tramadol Hcl Other (See Comments)    SEIZURE   Sulfa Antibiotics Hives   Shellfish  Allergy Nausea And Vomiting and Rash    Scallops specifically      REVIEW OF SYSTEMS (Negative unless checked)  Review of symptoms not completed due to patient being critically ill.  Husband provides some information.  Constitutional: [] Weight loss  [] Fever  [] Chills Cardiac: [] Chest pain   [] Chest pressure   [] Palpitations   [] Shortness of breath when laying flat   [] Shortness of breath at rest   [] Shortness of breath with exertion. Vascular:  [] Pain in legs with walking   [] Pain in legs at rest   [] Pain in legs when laying flat   [] Claudication   [] Pain in feet when walking  [] Pain in feet at rest  [] Pain in feet when laying flat   [] History of DVT   [] Phlebitis   [] Swelling in legs   [] Varicose veins   [] Non-healing ulcers Pulmonary:   [] Uses home oxygen   [] Productive cough   [] Hemoptysis   [] Wheeze  [] COPD   [] Asthma Neurologic:  [] Dizziness  [] Blackouts   [] Seizures   [] History of stroke   [] History of TIA  [] Aphasia   [] Temporary blindness   [] Dysphagia   [] Weakness or numbness in arms   [] Weakness or numbness in legs Musculoskeletal:  [] Arthritis   [] Joint swelling   [] Joint pain   [] Low back pain Hematologic:  [] Easy bruising  [] Easy bleeding   [] Hypercoagulable state   [] Anemic  [] Hepatitis Gastrointestinal:  [] Blood in stool   [] Vomiting blood  [] Gastroesophageal reflux/heartburn   [] Difficulty swallowing. Genitourinary:  [] Chronic kidney disease   [] Difficult urination  [] Frequent urination  [] Burning with urination   [] Blood in urine Skin:  [] Rashes   [] Ulcers   [] Wounds Psychological:  [] History of anxiety   []  History of  major depression.  Physical Examination  Vitals:   01/13/23 2215 01/13/23 2230 01/13/23 2245 01/13/23 2300  BP: (!) 142/85 (!) 144/84 138/86 (!) 136/94  Pulse: (!) 137 (!) 142 (!) 139 (!) 139  Resp: (!) 21 (!) 24 19 (!) 21  Temp:    99.7 F (37.6 C)  TempSrc:    Bladder  SpO2: 100% 100% 100% 100%  Weight:      Height:       Body mass index is 19.63 kg/m. Gen: Ill-appearing, awake but not responsive Head: [place]/AT, No temporalis wasting. Prominent temp pulse not noted. Ear/Nose/Throat: Hearing grossly intact, nares w/o erythema or drainage, oropharynx w/o Erythema/Exudate Eyes: Sclera non-icteric, conjunctiva clear Neck: Trachea midline.  No JVD.  Vascular: Foot is warm, however this may be due to presence of warming agent Vessel Right Left  Popliteal Nor Palpable AKA  PT Not palable AKA  DP Not palpable AKA   Gastrointestinal: soft, non-tender/non-distended. No guarding/reflex.     CBC Lab Results  Component Value Date   WBC 21.6 (H) 01/13/2023   HGB 14.4 01/13/2023   HCT 46.9 (H) 01/13/2023   MCV 90.2 01/13/2023   PLT 337 01/13/2023    BMET    Component Value Date/Time   NA 138 01/13/2023 2130   NA 135 (L) 06/20/2012 2127   K 3.0 (L) 01/13/2023 2130   K 4.0 06/20/2012 2127   CL 100 01/13/2023 2130   CL 100 06/20/2012 2127   CO2 26 01/13/2023 2130   CO2 27 06/20/2012 2127   GLUCOSE 265 (H) 01/13/2023 2130   GLUCOSE 250 (H) 06/20/2012 2127   BUN 15 01/13/2023 2130   BUN 20 (H) 06/20/2012 2127   CREATININE 0.55 01/13/2023 2130   CREATININE 0.60 06/20/2012 2127  CALCIUM 7.0 (L) 01/13/2023 2130   CALCIUM 8.8 06/20/2012 2127   GFRNONAA >60 01/13/2023 2130   GFRNONAA >60 06/20/2012 2127   GFRAA >60 06/20/2012 2127   Estimated Creatinine Clearance: 47.7 mL/min (by C-G formula based on SCr of 0.55 mg/dL).  COAG Lab Results  Component Value Date   INR 1.3 (H) 01/13/2023   INR 1.1 10/01/2022   INR 1.0 10/01/2022    Radiology CT Angio Abd/Pel w/ and/or  w/o  Result Date: 01/13/2023 CLINICAL DATA:  Weakness confusion EXAM: CTA ABDOMEN AND PELVIS WITHOUT AND WITH CONTRAST TECHNIQUE: Multidetector CT imaging of the abdomen and pelvis was performed using the standard protocol during bolus administration of intravenous contrast. Multiplanar reconstructed images and MIPs were obtained and reviewed to evaluate the vascular anatomy. RADIATION DOSE REDUCTION: This exam was performed according to the departmental dose-optimization program which includes automated exposure control, adjustment of the mA and/or kV according to patient size and/or use of iterative reconstruction technique. CONTRAST:  OMNIPAQUE IOHEXOL 350 MG/ML SOLN COMPARISON:  CT 11/10/2020, 08/04/2022 FINDINGS: VASCULAR Aorta: Negative for aneurysm or dissection. Heavy atherosclerosis of the aorta. Celiac: Patent at the origin. Moderate severe disease of the celiac branches without aneurysm or occlusion. SMA: Stent in the proximal SMA. On sagittal views, there is patency of the stent. Mild poststenotic dilatation of the SMA after the stent with moderate calcification and mural disease but no occlusion or dissection. Renals: 2 right and single left renal arteries. Suspect at least mild stenosis at the proximal right renal artery. The left renal artery is patent. Inferior accessory renal artery appears patent but there is a possible web, coronal series 9, image 52. This finding is unchanged. No aneurysm or occlusion. IMA: Patent without evidence of aneurysm, dissection, vasculitis or significant stenosis. Inflow: Chronically occluded bilateral iliac stents. Interim placement of bilateral external iliac stents which are also occluded. The bilateral internal iliac vessels are mostly occluded with some flow distally. Proximal Outflow: Occluded appearing common femoral arteries. Partially visualized bilateral SFA stents also occluded. Occluded profunda Review of the MIP images confirms the above findings.  NON-VASCULAR Lower chest: Lung bases demonstrate heterogeneous consolidations and ground-glass densities at the right upper lobe inferiorly, the right middle lobe, and the bilateral lower lobes. No significant effusion. Aortic valve prosthesis. Heavily calcified coronary vessels. Hepatobiliary: Status post cholecystectomy. Intra and extrahepatic biliary dilatation stable to slightly increased. No focal hepatic abnormality. Pancreas: Unremarkable. No pancreatic ductal dilatation or surrounding inflammatory changes. Spleen: Normal in size without focal abnormality. Adrenals/Urinary Tract: Adrenal glands are normal. No hydronephrosis. The bladder is slightly thick walled with Foley catheter. Stomach/Bowel: Esophageal tube tip in the posterior fundus. Mild fluid distension of the stomach. No dilated small bowel. Negative appendix. No acute bowel wall thickening. Diverticular disease of the left colon. Lymphatic: No suspicious lymph nodes Reproductive: Negative for adnexal mass. Endometrial fluid or thickening measuring up to 10 mm with endometrial enhancement. Other: Negative for pelvic effusion or free air. Musculoskeletal: Chronic compression deformity at T11. Advanced degenerative changes of the lower lumbar spine. IMPRESSION: 1. Negative for acute aortic dissection or aneurysm. 2. Patent SMA stent but with suspicion of mild to moderate diffuse luminal stenosis within the stent. 3. Chronically occluded bilateral iliac stents. Interim placement of bilateral external iliac stents which are also occluded. Mostly occluded bilateral internal iliac vessels. Occluded bilateral common arteries. Partially visualized bilateral SFA stents which are also occluded. Aortic Atherosclerosis (ICD10-I70.0). NON-VASCULAR *Heterogeneous consolidations and ground-glass densities at the right upper lobe, right middle  lobe, and bilateral lower lobes, suspicious for multifocal pneumonia potential atypical or viral pneumonia. *No CT evidence  for acute intra-abdominal or pelvic abnormality. *Endometrial fluid or thickening measuring up to 10 mm with endometrial enhancement. Recommend correlation with nonemergent pelvic ultrasound. *Diverticular disease of the colon without acute wall thickening. *Slightly thick-walled appearance of the urinary bladder which may be due to under distension or cystitis. *Aortic atherosclerosis. Electronically Signed   By: Jasmine Pang M.D.   On: 01/13/2023 21:53   DG Chest Portable 1 View  Result Date: 01/13/2023 CLINICAL DATA:  Intubated EXAM: PORTABLE CHEST 1 VIEW COMPARISON:  X-ray 01/13/2023 earlier FINDINGS: Enteric tube in place with tip extending beneath the diaphragm along the fundus of the stomach. ET tube with the tip just above the carina by proximally 2 cm. Status post TAVR. Presumed coronary stents. Stable cardiopericardial silhouette with increasing vascular congestion and bilateral lung base opacities, right-greater-than-left. Question tiny right effusion. No pneumothorax. Overlapping cardiac leads. IMPRESSION: New ET tube and enteric tube. Increasing lung base opacities, right-greater-than-left. Increasing vascular congestion. Recommend follow-up Electronically Signed   By: Karen Kays M.D.   On: 01/13/2023 18:57   CT Head Wo Contrast  Result Date: 01/13/2023 CLINICAL DATA:  Altered mental status EXAM: CT HEAD WITHOUT CONTRAST TECHNIQUE: Contiguous axial images were obtained from the base of the skull through the vertex without intravenous contrast. RADIATION DOSE REDUCTION: This exam was performed according to the departmental dose-optimization program which includes automated exposure control, adjustment of the mA and/or kV according to patient size and/or use of iterative reconstruction technique. COMPARISON:  None Available. FINDINGS: Brain: No evidence of acute infarction, hemorrhage, hydrocephalus, extra-axial collection or mass lesion/mass effect. 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 Vascular: No hyperdense vessel or unexpected calcification. Skull: Normal. Negative for fracture or focal lesion. Sinuses/Orbits: No middle ear or mastoid effusion. Paranasal sinuses are clear. Bilateral lens replacement. Orbits are otherwise unremarkable. Other: None. IMPRESSION: 1. No acute intracranial abnormality. 2. 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 Electronically Signed   By: Lorenza Cambridge M.D.   On: 01/13/2023 09:47   DG Chest Port 1 View  Result Date: 01/13/2023 CLINICAL DATA:  Sepsis EXAM: PORTABLE CHEST 1 VIEW COMPARISON:  10/17/2022 FINDINGS: The heart size and mediastinal contours are within normal limits. Prior TAVR. Coronary artery stent. Aortic atherosclerosis. Increased interstitial markings bilaterally with small developing opacity in the right lower lobe. No pleural effusion or pneumothorax. The visualized skeletal structures are unremarkable. IMPRESSION: Increased interstitial markings bilaterally with small developing opacity in the right lower lobe. Findings suspicious for atypical/viral infection. Electronically Signed   By: Duanne Guess D.O.   On: 01/13/2023 09:04      Assessment/Plan 1.  Peripheral arterial disease  Currently the patient is showing signs of sepsis and some hemodynamic compromise.  This can certainly cause some changes which may displace consistent with arterial insufficiency.  Because of this we do not recommend urgent intervention, however we do recommend continued treatment of sepsis as this may actually improve her perfusion of her lower extremities.  Recommend initiation of heparin drip due to concern for possible ischemia.  We will continue to follow patient's status and plan possible intervention pending patient improvement. 2.  Acute on chronic mesenteric ischemia  There is concern for possible mottling  of abdomen.  A CT scan of the abdomen pelvis has been ordered.  We  suspect this may be related to the sepsis/DKA.  Further intervention, may be plan pending results of CTA   Plan of care discussed with Dr. Gilda Crease and he is in agreement with plan noted above.   Family Communication:  Total Time:75 minutes I spent 75 minutes in this encounter including personally reviewing extensive medical records, personally reviewing imaging studies and compared to prior scans, counseling the patient, placing orders, coordinating care and performing appropriate documentation  Thank you for allowing [place] to participate in the care of this patient.   Georgiana Spinner, NP [facility] [phone] (Office Phone) [phone] (Office Fax) [phone] (Pager)  01/13/2023 11:55 PM  Staff may message me via secure chat in Epic  but this may not receive immediate response,  please page for urgent matters!  Dictation software was used to generate the above note. Typos may occur and escape review, as with typed/written notes. Any error is purely unintentional.  Please contact me directly for clarity if needed.

## 2023-01-13 NOTE — Consult Note (Incomplete)
[facility] Vascular Consult Note  MRN : [id number]  Heather [name] is a 72 y.o. (06-May-1951) female who presents with chief complaint of  Chief Complaint  Patient presents with  . Tachycardia  .   Consulting Physician: Reason for consult: History of Present Illness: ***  Current Facility-Administered Medications  Medication Dose Route Frequency Provider Last Rate Last Admin  . acetaminophen (TYLENOL) tablet 650 mg  650 mg Oral Q6H PRN Verdene Lennert, MD       Or  . acetaminophen (TYLENOL) suppository 650 mg  650 mg Rectal Q6H PRN Verdene Lennert, MD      . Melene Muller ON 01/14/2023] Chlorhexidine Gluconate Cloth 2 % PADS 6 each  6 each Topical Daily Ouma, Hubbard Hartshorn, NP      . dextrose 5 % in lactated ringers infusion   Intravenous Continuous Phineas Semen, MD 125 mL/hr at 01/13/23 1700 New Bag at 01/13/23 1700  . dextrose 50 % solution 0-50 mL  0-50 mL Intravenous PRN Phineas Semen, MD      . docusate (COLACE) 50 MG/5ML liquid 100 mg  100 mg Per Tube BID Erin Fulling, MD      . fentaNYL (SUBLIMAZE) bolus via infusion 25-100 mcg  25-100 mcg Intravenous Q15 min PRN Erin Fulling, MD      . fentaNYL in NS (71mcg/ml) infusion-PREMIX  25-200 mcg/hr Intravenous Continuous Erin Fulling, MD 10 mL/hr at 01/13/23 2224 100 mcg/hr at 01/13/23 2224  . folic acid injection 1 mg  1 mg Intravenous Daily Verdene Lennert, MD   1 mg at 01/13/23 1415  . heparin ADULT infusion 100 units/mL (25000 units/251mL)  550 Units/hr Intravenous Continuous Dorothea Ogle B, RPH 5.5 mL/hr at 01/13/23 1600 550 Units/hr at 01/13/23 1600  . insulin regular, human (MYXREDLIN) 100 units/ 100 mL infusion   Intravenous Continuous Phineas Semen, MD 7.5 mL/hr at 01/13/23 2223 7.5 Units/hr at 01/13/23 2223  . ipratropium-albuterol (DUONEB) 0.5-2.5 (3) MG/3ML nebulizer solution 3 mL  3 mL Nebulization Q6H Verdene Lennert, MD   3 mL at 01/13/23 2000  . ondansetron (ZOFRAN) tablet 4  mg  4 mg Oral Q6H PRN Verdene Lennert, MD       Or  . ondansetron (ZOFRAN) injection 4 mg  4 mg Intravenous Q6H PRN Verdene Lennert, MD      . piperacillin-tazobactam (ZOSYN) IVPB 3.375 g  3.375 g Intravenous Q8H Jaynie Bream, RPH   Stopped at 01/13/23 2327  . polyethylene glycol (MIRALAX / GLYCOLAX) packet 17 g  17 g Per Tube Daily Erin Fulling, MD   17 g at 01/13/23 2021  . sodium bicarbonate 150 mEq in sterile water 1,150 mL infusion   Intravenous Continuous Verdene Lennert, MD 150 mL/hr at 01/13/23 1642 New Bag at 01/13/23 1642  . sodium chloride flush (NS) 0.9 % injection 3 mL  3 mL Intravenous Q12H Verdene Lennert, MD   3 mL at 01/13/23 2139  . thiamine (VITAMIN B1) injection 100 mg  100 mg Intravenous Daily Verdene Lennert, MD   100 mg at 01/13/23 1415  . [START ON 01/14/2023] vancomycin (VANCOREADY) IVPB 750 mg/150 mL  750 mg Intravenous Q24H Jaynie Bream, [facility]        Past Medical History:  Diagnosis Date  . (HFpEF) heart failure with preserved ejection fraction (HCC)   . Angina pectoris (HCC)   . Anxiety   . Aortic atherosclerosis (HCC)   . Basal cell carcinoma   . Bilateral carotid artery disease (HCC)  a.) carotid doppler 03/18/2021: 1-39% RICA, 40-59% LICA  . Bipolar disorder (HCC)   . Cervicalgia   . Chronic mesenteric ischemia (HCC)    a.) s/p PTA 11/13/2020 --> 6 x 16 mm lifestream ballon expanding stent to SMA  . COPD (chronic obstructive pulmonary disease) (HCC)   . Coronary artery disease 03/20/2014   a.) LHC/PCI 03/20/2014: 70% mLAD (2.5 x 28 mm Xience Alpine DES), 70% dLCx, 70% dRCA; b.) LHC 01/25/2016: 90% ISR mLAD --> mLAD dissection with in stent --> 2.75 x 22 mm Resolute DES; c.) R/LHC 09/29/2016: 50% ISR mLAD, 70% dLAD, mPA 37, PCWP 24, AO sat 90%, PA sat 46%, CO 3.7, CI 2.46 - med mgmt  . DDD (degenerative disc disease), cervical   . Depression   . Diabetic polyneuropathy (HCC)   . GERD (gastroesophageal reflux disease)   . Hyperlipidemia   .  Hypertension   . Long term current use of antithrombotics/antiplatelets    a.) on DAPT (ASA + clopidogrel)  . Memory loss   . NSTEMI (non-ST elevated myocardial infarction) (HCC) 07/2016   a.) troponins trended (normal < 0.034 ng/mL): 0.325 --> 1.480 --> 2.330 --> 1.660 --> 0.169 ng/mL  . PAD (peripheral artery disease) (HCC)    a.) s/p PTA and stenting SMA 11/13/2020; b.) s/p PTA and stenting of RIGHT SFA and popliteal 04/12/2021; c.) s/p PTA and kissing balloon stents to BILATERAL CIAs 05/28/2021  . Respiratory failure with hypoxia (HCC)   . 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 Schizophrenia (HCC)   . Severe aortic stenosis    a.) s/p TAVR 09/30/2016; 23 mm Sapien 3 via a suprasternal approach  . Type 2 diabetes mellitus treated with insulin (HCC)   . Vertigo     Past Surgical History:  Procedure Laterality Date  . AMPUTATION Left 08/14/2022   Procedure: ABOVE KNEE AMPUTATION;  Surgeon: Maeola Harman, MD;  Location: [facility] OR;  Service: Vascular;  Laterality: Left;  . AORTIC VALVE REPLACEMENT  09/30/2016   Procedure: TRANSCATHETER AORTIC VALVE REPAIR  . APPLICATION OF WOUND VAC Bilateral 08/14/2022   Procedure: APPLICATION OF WOUND VAC;  Surgeon: Maeola Harman, MD;  Location: [facility] OR;  Service: Vascular;  Laterality: Bilateral;  . BLADDER SURGERY    . CATARACT EXTRACTION, BILATERAL    . CHOLECYSTECTOMY    . COLONOSCOPY    . CORONARY ANGIOPLASTY WITH STENT PLACEMENT  03/20/2014   Procedure: CORONARY ANGIOPLASTY WITH STENT PLACEMENT; Location: [facility]; Surgeon: Willene Hatchet, MD  . CORONARY ANGIOPLASTY WITH STENT PLACEMENT Left 01/25/2016   Procedure: CORONARY ANGIOPLASTY WITH STENT PLACEMENT; Location: [facility]; Surgeon: Willene Hatchet, MD  . ENDARTERECTOMY FEMORAL Bilateral 08/04/2022   Procedure: BILATERAL FEMORAL ENDARTERECTOMY;  Surgeon: Maeola Harman, MD;  Location: [facility] OR;   Service: Vascular;  Laterality: Bilateral;  . FASCIECTOMY Bilateral 08/04/2022   Procedure: BILATERAL LOWER EXTREMITY FASCIECTOMIES;  Surgeon: Maeola Harman, MD;  Location: [facility] OR;  Service: Vascular;  Laterality: Bilateral;  . FOOT SURGERY Right   . GROIN DEBRIDEMENT Bilateral 08/14/2022   Procedure: GROIN DEBRIDEMENT;  Surgeon: Maeola Harman, MD;  Location: [facility] OR;  Service: Vascular;  Laterality: Bilateral;  . GROIN DEBRIDEMENT Bilateral 09/16/2022   Procedure: GROIN DEBRIDEMENT POSSIBLE VAC PLACMENT;  Surgeon: Victorino Sparrow, MD;  Location: [facility] OR;  Service: Vascular;  Laterality: Bilateral;  . HEMORRHOID SURGERY    . INSERTION OF ILIAC STENT Bilateral 08/04/2022   Procedure: INSERTION OF BILATERAL  ILIAC STENTS;  Surgeon: Maeola Harman, MD;  Location: [facility] OR;  Service: Vascular;  Laterality: Bilateral;  . IRRIGATION AND DEBRIDEMENT HEMATOMA Left 07/08/2014   GROIN  . LOWER EXTREMITY ANGIOGRAM Bilateral 08/04/2022   Procedure: LOWER EXTREMITY ANGIOGRAM;  Surgeon: Maeola Harman, MD;  Location: [facility] OR;  Service: Vascular;  Laterality: Bilateral;  . LOWER EXTREMITY ANGIOGRAPHY Right 04/12/2021   Procedure: Lower Extremity Angiography;  Surgeon: Renford Dills, MD;  Location: [facility] INVASIVE CV LAB;  Service: Cardiovascular;  Laterality: Right;  . LOWER EXTREMITY ANGIOGRAPHY Left 05/28/2021   Procedure: LOWER EXTREMITY ANGIOGRAPHY;  Surgeon: Renford Dills, MD;  Location: [facility] INVASIVE CV LAB;  Service: Cardiovascular;  Laterality: Left;  . LOWER EXTREMITY ANGIOGRAPHY Left 03/25/2022   Procedure: Lower Extremity Angiography;  Surgeon: Renford Dills, MD;  Location: [facility] INVASIVE CV LAB;  Service: Cardiovascular;  Laterality: Left;  . PATCH ANGIOPLASTY  08/04/2022   Procedure: LEFT FEMORAL PATCH ANGIOPLASTY USING Livia Snellen BIOLOGIC PATCH;  Surgeon: Maeola Harman, MD;  Location: [facility] OR;  Service: Vascular;;  . PSEUDOANEURYSM REPAIR  Left 06/30/2014   FEMORAL ARTERY  . RIGHT/LEFT HEART CATH AND CORONARY ANGIOGRAPHY Bilateral 09/29/2016   Procedure: RIGHT/LEFT HEART CATH AND CORONARY ANGIOGRAPHY; Location [facility]; Surgeon: Willene Hatchet, MD  . THROMBECTOMY FEMORAL ARTERY Bilateral 08/04/2022   Procedure: BILATERAL LOWER EXTREMITY THROMBECTOMY;  Surgeon: Maeola Harman, MD;  Location: [facility] OR;  Service: Vascular;  Laterality: Bilateral;  . TUBAL LIGATION    . VISCERAL ANGIOGRAPHY N/A 11/13/2020   Procedure: VISCERAL ANGIOGRAPHY;  Surgeon: Renford Dills, MD;  Location: [facility] INVASIVE CV LAB;  Service: Cardiovascular;  Laterality: N/A;    Social History Social History   Tobacco Use  . Smoking status: Every Day    Packs/day: 1.50    Years: 48.00    Additional pack years: 0.00    Total pack years: 72.00    Types: Cigarettes  . Smokeless tobacco: Never  Vaping Use  . Vaping Use: Never used  Substance Use Topics  . Alcohol use: Not Currently  . Drug use: Not Currently    Family History Family History  Problem Relation Age of Onset  . Mental illness Mother   . Breast cancer Neg Hx     Allergies  Allergen Reactions  . Iodinated Contrast Media Hives  . Tramadol Hcl Other (See Comments)    SEIZURE  . Sulfa Antibiotics Hives  . Shellfish Allergy Nausea And Vomiting and Rash    Scallops specifically      REVIEW OF SYSTEMS (Negative unless checked)  Constitutional: [] Weight loss  [] Fever  [] Chills Cardiac: [] Chest pain   [] Chest pressure   [] Palpitations   [] Shortness of breath when laying flat   [] Shortness of breath at rest   [] Shortness of breath with exertion. Vascular:  [] Pain in legs with walking   [] Pain in legs at rest   [] Pain in legs when laying flat   [] Claudication   [] Pain in feet when walking  [] Pain in feet at rest  [] Pain in feet when laying flat   [] History of DVT   [] Phlebitis   [] Swelling in legs   [] Varicose veins   [] Non-healing ulcers Pulmonary:   [] Uses home oxygen    [] Productive cough   [] Hemoptysis   [] Wheeze  [] COPD   [] Asthma Neurologic:  [] Dizziness  [] Blackouts   [] Seizures   [] History of stroke   [] History of TIA  [] Aphasia   [] Temporary blindness   [] Dysphagia   [] Weakness or numbness in arms   []   Weakness or numbness in legs Musculoskeletal:  [] Arthritis   [] Joint swelling   [] Joint pain   [] Low back pain Hematologic:  [] Easy bruising  [] Easy bleeding   [] Hypercoagulable state   [] Anemic  [] Hepatitis Gastrointestinal:  [] Blood in stool   [] Vomiting blood  [] Gastroesophageal reflux/heartburn   [] Difficulty swallowing. Genitourinary:  [] Chronic kidney disease   [] Difficult urination  [] Frequent urination  [] Burning with urination   [] Blood in urine Skin:  [] Rashes   [] Ulcers   [] Wounds Psychological:  [] History of anxiety   []  History of major depression.  Physical Examination  Vitals:   01/13/23 2215 01/13/23 2230 01/13/23 2245 01/13/23 2300  BP: (!) 142/85 (!) 144/84 138/86 (!) 136/94  Pulse: (!) 137 (!) 142 (!) 139 (!) 139  Resp: (!) 21 (!) 24 19 (!) 21  Temp:    99.7 F (37.6 C)  TempSrc:    Bladder  SpO2: 100% 100% 100% 100%  Weight:      Height:       Body mass index is 19.63 kg/m. Gen:  WD/WN, NAD Head: [place]/AT, No temporalis wasting. Prominent temp pulse not noted. Ear/Nose/Throat: Hearing grossly intact, nares w/o erythema or drainage, oropharynx w/o Erythema/Exudate Eyes: Sclera non-icteric, conjunctiva clear Neck: Trachea midline.  No JVD.  Pulmonary:  Good air movement, respirations not labored, equal bilaterally.  Cardiac: RRR, normal S1, S2. Vascular: *** Vessel Right Left  Radial Palpable Palpable  Ulnar Palpable Palpable  Brachial Palpable Palpable  Carotid Palpable, without bruit Palpable, without bruit  Aorta Not palpable N/A  Femoral Palpable Palpable  Popliteal Palpable Palpable  PT Palpable Palpable  DP Palpable Palpable   Gastrointestinal: soft, non-tender/non-distended. No guarding/reflex.  Musculoskeletal:  M/S 5/5 throughout.  Extremities without ischemic changes.  No deformity or atrophy. No edema. Neurologic: Sensation grossly intact in extremities.  Symmetrical.  Speech is fluent. Motor exam as listed above. Psychiatric: Judgment intact, Mood & affect appropriate for pt's clinical situation. Dermatologic: No rashes or ulcers noted.  No cellulitis or open wounds. Lymph : No Cervical, Axillary, or Inguinal lymphadenopathy.    CBC Lab Results  Component Value Date   WBC 21.6 (H) 01/13/2023   HGB 14.4 01/13/2023   HCT 46.9 (H) 01/13/2023   MCV 90.2 01/13/2023   PLT 337 01/13/2023    BMET    Component Value Date/Time   NA 138 01/13/2023 2130   NA 135 (L) 06/20/2012 2127   K 3.0 (L) 01/13/2023 2130   K 4.0 06/20/2012 2127   CL 100 01/13/2023 2130   CL 100 06/20/2012 2127   CO2 26 01/13/2023 2130   CO2 27 06/20/2012 2127   GLUCOSE 265 (H) 01/13/2023 2130   GLUCOSE 250 (H) 06/20/2012 2127   BUN 15 01/13/2023 2130   BUN 20 (H) 06/20/2012 2127   CREATININE 0.55 01/13/2023 2130   CREATININE 0.60 06/20/2012 2127   CALCIUM 7.0 (L) 01/13/2023 2130   CALCIUM 8.8 06/20/2012 2127   GFRNONAA >60 01/13/2023 2130   GFRNONAA >60 06/20/2012 2127   GFRAA >60 06/20/2012 2127   Estimated Creatinine Clearance: 47.7 mL/min (by C-G formula based on SCr of 0.55 mg/dL).  COAG Lab Results  Component Value Date   INR 1.3 (H) 01/13/2023   INR 1.1 10/01/2022   INR 1.0 10/01/2022    Radiology CT Angio Abd/Pel w/ and/or w/o  Result Date: 01/13/2023 CLINICAL DATA:  Weakness confusion EXAM: CTA ABDOMEN AND PELVIS WITHOUT AND WITH CONTRAST TECHNIQUE: Multidetector CT imaging of the abdomen and pelvis was performed using the  standard protocol during bolus administration of intravenous contrast. Multiplanar reconstructed images and MIPs were obtained and reviewed to evaluate the vascular anatomy. RADIATION DOSE REDUCTION: This exam was performed according to the departmental dose-optimization program  which includes automated exposure control, adjustment of the mA and/or kV according to patient size and/or use of iterative reconstruction technique. CONTRAST:  OMNIPAQUE IOHEXOL 350 MG/ML SOLN COMPARISON:  CT 11/10/2020, 08/04/2022 FINDINGS: VASCULAR Aorta: Negative for aneurysm or dissection. Heavy atherosclerosis of the aorta. Celiac: Patent at the origin. Moderate severe disease of the celiac branches without aneurysm or occlusion. SMA: Stent in the proximal SMA. On sagittal views, there is patency of the stent. Mild poststenotic dilatation of the SMA after the stent with moderate calcification and mural disease but no occlusion or dissection. Renals: 2 right and single left renal arteries. Suspect at least mild stenosis at the proximal right renal artery. The left renal artery is patent. Inferior accessory renal artery appears patent but there is a possible web, coronal series 9, image 52. This finding is unchanged. No aneurysm or occlusion. IMA: Patent without evidence of aneurysm, dissection, vasculitis or significant stenosis. Inflow: Chronically occluded bilateral iliac stents. Interim placement of bilateral external iliac stents which are also occluded. The bilateral internal iliac vessels are mostly occluded with some flow distally. Proximal Outflow: Occluded appearing common femoral arteries. Partially visualized bilateral SFA stents also occluded. Occluded profunda Review of the MIP images confirms the above findings. NON-VASCULAR Lower chest: Lung bases demonstrate heterogeneous consolidations and ground-glass densities at the right upper lobe inferiorly, the right middle lobe, and the bilateral lower lobes. No significant effusion. Aortic valve prosthesis. Heavily calcified coronary vessels. Hepatobiliary: Status post cholecystectomy. Intra and extrahepatic biliary dilatation stable to slightly increased. No focal hepatic abnormality. Pancreas: Unremarkable. No pancreatic ductal dilatation or  surrounding inflammatory changes. Spleen: Normal in size without focal abnormality. Adrenals/Urinary Tract: Adrenal glands are normal. No hydronephrosis. The bladder is slightly thick walled with Foley catheter. Stomach/Bowel: Esophageal tube tip in the posterior fundus. Mild fluid distension of the stomach. No dilated small bowel. Negative appendix. No acute bowel wall thickening. Diverticular disease of the left colon. Lymphatic: No suspicious lymph nodes Reproductive: Negative for adnexal mass. Endometrial fluid or thickening measuring up to 10 mm with endometrial enhancement. Other: Negative for pelvic effusion or free air. Musculoskeletal: Chronic compression deformity at T11. Advanced degenerative changes of the lower lumbar spine. IMPRESSION: 1. Negative for acute aortic dissection or aneurysm. 2. Patent SMA stent but with suspicion of mild to moderate diffuse luminal stenosis within the stent. 3. Chronically occluded bilateral iliac stents. Interim placement of bilateral external iliac stents which are also occluded. Mostly occluded bilateral internal iliac vessels. Occluded bilateral common arteries. Partially visualized bilateral SFA stents which are also occluded. Aortic Atherosclerosis (ICD10-I70.0). NON-VASCULAR *Heterogeneous consolidations and ground-glass densities at the right upper lobe, right middle lobe, and bilateral lower lobes, suspicious for multifocal pneumonia potential atypical or viral pneumonia. *No CT evidence for acute intra-abdominal or pelvic abnormality. *Endometrial fluid or thickening measuring up to 10 mm with endometrial enhancement. Recommend correlation with nonemergent pelvic ultrasound. *Diverticular disease of the colon without acute wall thickening. *Slightly thick-walled appearance of the urinary bladder which may be due to under distension or cystitis. *Aortic atherosclerosis. Electronically Signed   By: Jasmine Pang M.D.   On: 01/13/2023 21:53   DG Chest Portable 1  View  Result Date: 01/13/2023 CLINICAL DATA:  Intubated EXAM: PORTABLE CHEST 1 VIEW COMPARISON:  X-ray 01/13/2023 earlier FINDINGS: Enteric  tube in place with tip extending beneath the diaphragm along the fundus of the stomach. ET tube with the tip just above the carina by proximally 2 cm. Status post TAVR. Presumed coronary stents. Stable cardiopericardial silhouette with increasing vascular congestion and bilateral lung base opacities, right-greater-than-left. Question tiny right effusion. No pneumothorax. Overlapping cardiac leads. IMPRESSION: New ET tube and enteric tube. Increasing lung base opacities, right-greater-than-left. Increasing vascular congestion. Recommend follow-up Electronically Signed   By: Karen Kays M.D.   On: 01/13/2023 18:57   CT Head Wo Contrast  Result Date: 01/13/2023 CLINICAL DATA:  Altered mental status EXAM: CT HEAD WITHOUT CONTRAST TECHNIQUE: Contiguous axial images were obtained from the base of the skull through the vertex without intravenous contrast. RADIATION DOSE REDUCTION: This exam was performed according to the departmental dose-optimization program which includes automated exposure control, adjustment of the mA and/or kV according to patient size and/or use of iterative reconstruction technique. COMPARISON:  None Available. FINDINGS: Brain: No evidence of acute infarction, hemorrhage, hydrocephalus, extra-axial collection or mass lesion/mass effect. 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 Vascular: No hyperdense vessel or unexpected calcification. Skull: Normal. Negative for fracture or focal lesion. Sinuses/Orbits: No middle ear or mastoid effusion. Paranasal sinuses are clear. Bilateral lens replacement. Orbits are otherwise unremarkable. Other: None. IMPRESSION: 1. No acute intracranial abnormality. 2. Sequela of moderate chronic microvascular ischemic change with likely chronic infarct in the  left basal ganglia. Electronically Signed   By: Lorenza Cambridge M.D.   On: 01/13/2023 09:47   DG Chest Port 1 View  Result Date: 01/13/2023 CLINICAL DATA:  Sepsis EXAM: PORTABLE CHEST 1 VIEW COMPARISON:  10/17/2022 FINDINGS: The heart size and mediastinal contours are within normal limits. Prior TAVR. Coronary artery stent. Aortic atherosclerosis. Increased interstitial markings bilaterally with small developing opacity in the right lower lobe. No pleural effusion or pneumothorax. The visualized skeletal structures are unremarkable. IMPRESSION: Increased interstitial markings bilaterally with small developing opacity in the right lower lobe. Findings suspicious for atypical/viral infection. Electronically Signed   By: Duanne Guess D.O.   On: 01/13/2023 09:04      Assessment/Plan 1.  Peripheral arterial disease  Currently the patient is showing signs of sepsis and some hemodynamic compromise.  This can certainly cause some changes which may displace consistent with arterial insufficiency.  Because of this we do not recommend urgent intervention, however we do recommend continued treatment of sepsis as this may actually improve her perfusion of her lower extremities.  Recommend initiation of heparin drip due to concern for possible ischemia.  We will continue to follow patient's status and plan possible intervention pending patient improvement. 2.  Acute on chronic mesenteric ischemia  There is concern for possible mottling of abdomen.  A CT scan of the abdomen pelvis has been ordered.  We suspect this may be related to the sepsis/DKA.  Further intervention, may be plan pending results of CTA   Plan of care discussed with Dr. Gilda Crease and he is in agreement with plan noted above.   Family Communication:  Total Time:75 minutes I spent 75 minutes in this encounter including personally reviewing extensive medical records, personally reviewing imaging studies and compared to prior scans, counseling  the patient, placing orders, coordinating care and performing appropriate documentation  Thank you for allowing [place] to participate in the care of this patient.   Georgiana Spinner, NP [facility] [phone] (Office Phone) [phone] (Office Fax) [phone] (Pager)  01/13/2023 11:55 PM  Staff may message me via secure chat in Epic  but this may not receive immediate response,  please page for urgent matters!  Dictation software was used to generate the above note. Typos may occur and escape review, as with typed/written notes. Any error is purely unintentional.  Please contact me directly for clarity if needed.

## 2023-01-13 NOTE — Assessment & Plan Note (Signed)
Bicarb less than 7 and pH of 7.14 on VBG in the setting of both metabolic and respiratory acidosis.  - Will consider adding bicarb to maintenance fluids if there is any evidence of hemodynamic compromise

## 2023-01-13 NOTE — Progress Notes (Addendum)
  Progress Note   Patient: Heather [name] [id number]:[id number] DOB: 04/06/1951 DOA: 01/13/2023     0 DOS: the patient was seen and examined on 01/13/2023  Updated patient's daughter Detrol via telephone.  Discussed that patient is quite critically ill at this point given DKA, critical ischemia, demand ischemia, severe acidosis and community-acquired pneumonia.  We discussed that there is a high risk for decompensation and ultimate cardiac arrest.  DTrans states that during the last hospitalization, she had discussed with Mrs. Wanko what she would want in the setting.  Mrs. Blunck stated that she would want resuscitative efforts for at least a short period of time.  [name] also provided some additional history that patient has not been feeling well for several weeks but her mentation seemed at baseline last night over telephone.   Interval labs demonstrate elevated troponin of 1356.  Assessment/Plan:   # Demand ischemia # PAD with critical limb ischemia and possible acute on chronic mesenteric ischemia # Severe combined acidosis  # Goals of Care   - Continue to trend troponin till peak - CTA abdomen/pelvis ordered with contrast allergy protocol including Solu-Medrol and Benadryl per discussion with vascular surgery.  Benefit of Solu-Medrol outweighs the risks given patient is already on insulin infusion - Transition IV fluids to include bicarbonate - To remain as full code; Palliative consultation placed - PCCM consulted; discussed with Dr. Belia Heman  Author: Verdene Lennert, MD 01/13/2023 3:10 PM  For on call review [email or web address].

## 2023-01-13 NOTE — ED Notes (Signed)
Pt does have some redness noted to buttocks with stage 1 pressure injury.

## 2023-01-13 NOTE — Assessment & Plan Note (Signed)
History of extensive PAD with multiple stents and subsequent left AKA presenting with pale right lower extremity with nondopplerable pulses.  Vascular surgery consulted.  I am also concerned that her abdomen presents with significant mottling; will discuss with vascular surgery concerns for abdominal occlusion.  - Vascular surgery consulted; appreciate their recommendations - Continue heparin infusion per pharmacy dosing

## 2023-01-13 NOTE — Assessment & Plan Note (Signed)
CAD s/p DES to LAD.  On atorvastatin and Plavix at home.  Currently holding due to n.p.o. status.  EKG with nonspecific changes.  - Given critical illness, troponin pending

## 2023-01-13 NOTE — ED Notes (Signed)
Husband at bedside, states that the pt is bed bound and that he helps her as much as he can, pt is cold to touch and has mottling to bilat lower abd and groin, bear hugger applied to pt

## 2023-01-13 NOTE — IPAL (Signed)
  Interdisciplinary Goals of Care Family Meeting   Date carried out: 01/13/2023  Location of the meeting: Bedside  Member's involved: Physician, Nurse Practitioner, Bedside Registered Nurse, and Family Member or next of kin    GOALS OF CARE DISCUSSION  The Clinical status was relayed to family in detail- Daughter at bedside, Daughter over the phone, Husband at bedside  Updated and notified of patients medical condition- Patient remains unresponsive and will not open eyes to command.   Patient is having a weak cough and struggling to remove secretions.   Patient with increased WOB and using accessory muscles to breathe Explained to family course of therapy and the modalities   Patient with Progressive multiorgan failure with a very high probablity of a very minimal chance of meaningful recovery despite all aggressive and optimal medical therapy.   PLAN FOR EMERGENT INTUBATION FAMILY AWARE OF GRAVE PROGNOSIS PATIENT LOWER HALF OF HER BODY IS MOTTLING AND SHOWING SIGNS OF  DYING ABD PAIN LIKELY FROM ISCHEMIA GUT  Family understands the situation.  Family are satisfied with Plan of action and management. All questions answered  Additional CC time 35 mins   Elson Ulbrich Santiago Glad, M.D.  [facility]  Medical Director [facility] Medical Director [facility] Cardio-Pulmonary Department

## 2023-01-13 NOTE — Assessment & Plan Note (Signed)
Per chart review, patient has a history of dementia.  Likely contributing to altered mental status at this time.  - Monitor for evidence of delirium - Hold home centrally acting medications

## 2023-01-13 NOTE — Assessment & Plan Note (Signed)
Patient appears hypovolemic at this time.  No indication for diuretics.  - Daily weights

## 2023-01-13 NOTE — Assessment & Plan Note (Signed)
Patient is presenting to the ED with acute encephalopathy of uncertain timeline. Likely multi-factorial in the setting of DKA, limb ischemia, AKI and severe acidosis. Given lack of movement of right leg and prior CVA, there is concern for central process as well, although it may second to ischemia only.  CT head without acute abnormalities  - Will monitor closely for respiratory compromise at high risk for decompensation - N.p.o. - MRI brain without contrast stat given patient is on IV heparin

## 2023-01-13 NOTE — Assessment & Plan Note (Addendum)
No wheezing on examination or reported history of increased cough, however will continue DuoNebs every 6 hours given pneumonia.  - DuoNebs every 6 hours - No clear indication for steroids at this time; will hold off given DKA

## 2023-01-13 NOTE — Consult Note (Signed)
ANTICOAGULATION CONSULT NOTE  Pharmacy Consult for Heparin Infusion Indication: arterial clot  Patient Measurements: Height: 5' (152.4 cm) Weight: 45.6 kg (100 lb 8.5 oz) IBW/kg (Calculated) : 45.5 Heparin Dosing Weight: 45.6 kg  Labs: Recent Labs    01/13/23 0828 01/13/23 1001 01/13/23 1359 01/13/23 1603 01/13/23 2130  HGB 14.4  --   --   --   --   HCT 46.9*  --   --   --   --   PLT 337  --   --   --   --   APTT 27  --   --   --   --   LABPROT 16.1*  --   --   --   --   INR 1.3*  --   --   --   --   HEPARINUNFRC  --  <0.10* >1.10*  --  0.54  CREATININE 1.07*  --  0.86 0.71 0.55  TROPONINIHS  --   --  1,356* 2,875*  --     Estimated Creatinine Clearance: 47.7 mL/min (by C-G formula based on SCr of 0.55 mg/dL).  Medical History: Past Medical History:  Diagnosis Date   (HFpEF) heart failure with preserved ejection fraction (HCC)    Angina pectoris (HCC)    Anxiety    Aortic atherosclerosis (HCC)    Basal cell carcinoma    Bilateral carotid artery disease (HCC)    a.) carotid doppler 03/18/2021: 1-39% RICA, 40-59% LICA   Bipolar disorder (HCC)    Cervicalgia    Chronic mesenteric ischemia (HCC)    a.) s/p PTA 11/13/2020 --> 6 x 16 mm lifestream ballon expanding stent to SMA   COPD (chronic obstructive pulmonary disease) (HCC)    Coronary artery disease 03/20/2014   a.) LHC/PCI 03/20/2014: 70% mLAD (2.5 x 28 mm Xience Alpine DES), 70% dLCx, 70% dRCA; b.) LHC 01/25/2016: 90% ISR mLAD --> mLAD dissection with in stent --> 2.75 x 22 mm Resolute DES; c.) R/LHC 09/29/2016: 50% ISR mLAD, 70% dLAD, mPA 37, PCWP 24, AO sat 90%, PA sat 46%, CO 3.7, CI 2.46 - med mgmt   DDD (degenerative disc disease), cervical    Depression    Diabetic polyneuropathy (HCC)    GERD (gastroesophageal reflux disease)    Hyperlipidemia    Hypertension    Long term current use of antithrombotics/antiplatelets    a.) on DAPT (ASA + clopidogrel)   Memory loss    NSTEMI (non-ST elevated  myocardial infarction) (HCC) 07/2016   a.) troponins trended (normal < 0.034 ng/mL): 0.325 --> 1.480 --> 2.330 --> 1.660 --> 0.169 ng/mL   PAD (peripheral artery disease) (HCC)    a.) s/p PTA and stenting SMA 11/13/2020; b.) s/p PTA and stenting of RIGHT SFA and popliteal 04/12/2021; c.) s/p PTA and kissing balloon stents to BILATERAL CIAs 05/28/2021   Respiratory failure with hypoxia (HCC)    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  Assessment: 72 yo female presented by EMS to the ED due to weakness and confusion.  Pharmacy consulted to initiate heparin infusion for arterial clot.   0521 1359 HL > 1.1, suprathera; 750 un/hr  Heparin level was drawn 2.5 hours early 0521 2130 HL = 0.54, therapeutic X 1   Goal of Therapy:  Heparin level 0.3-0.7 units/ml Monitor platelets by anticoagulation protocol: Yes   Plan:  5/21:  HL @ 2130 = 0.54, therapeutic X 1 - Will continue pt on current rate and recheck HL on 5/22 @ 0330.  ----Daily CBC per protocol while on IV heparin  Antha Niday D 01/13/2023,10:05 PM

## 2023-01-13 NOTE — Consult Note (Signed)
ANTICOAGULATION CONSULT NOTE - Initial Consult  Pharmacy Consult for heparin infusion Indication: arterial clot  Allergies  Allergen Reactions   Iodinated Contrast Media Hives   Tramadol Hcl Other (See Comments)    SEIZURE   Sulfa Antibiotics Hives   Shellfish Allergy Nausea And Vomiting and Rash    Scallops specifically     Patient Measurements: Height: 5' (152.4 cm) Weight: 45.6 kg (100 lb 8.5 oz) IBW/kg (Calculated) : 45.5 Heparin Dosing Weight: 45.6 kg  Vital Signs: Temp: 97.6 F (36.4 C) (05/21 0834) Temp Source: Rectal (05/21 0834) BP: 147/94 (05/21 0842) Pulse Rate: 148 (05/21 0834)  Labs: Recent Labs    01/13/23 0828  HGB 14.4  HCT 46.9*  PLT 337  APTT 27  LABPROT 16.1*  INR 1.3*  CREATININE 1.07*    Estimated Creatinine Clearance: 35.6 mL/min (A) (by C-G formula based on SCr of 1.07 mg/dL (H)).   Medical History: Past Medical History:  Diagnosis Date   (HFpEF) heart failure with preserved ejection fraction (HCC)    Angina pectoris (HCC)    Anxiety    Aortic atherosclerosis (HCC)    Basal cell carcinoma    Bilateral carotid artery disease (HCC)    a.) carotid doppler 03/18/2021: 1-39% RICA, 40-59% LICA   Bipolar disorder (HCC)    Cervicalgia    Chronic mesenteric ischemia (HCC)    a.) s/p PTA 11/13/2020 --> 6 x 16 mm lifestream ballon expanding stent to SMA   COPD (chronic obstructive pulmonary disease) (HCC)    Coronary artery disease 03/20/2014   a.) LHC/PCI 03/20/2014: 70% mLAD (2.5 x 28 mm Xience Alpine DES), 70% dLCx, 70% dRCA; b.) LHC 01/25/2016: 90% ISR mLAD --> mLAD dissection with in stent --> 2.75 x 22 mm Resolute DES; c.) R/LHC 09/29/2016: 50% ISR mLAD, 70% dLAD, mPA 37, PCWP 24, AO sat 90%, PA sat 46%, CO 3.7, CI 2.46 - med mgmt   DDD (degenerative disc disease), cervical    Depression    Diabetic polyneuropathy (HCC)    GERD (gastroesophageal reflux disease)    Hyperlipidemia    Hypertension    Long term current use of  antithrombotics/antiplatelets    a.) on DAPT (ASA + clopidogrel)   Memory loss    NSTEMI (non-ST elevated myocardial infarction) (HCC) 07/2016   a.) troponins trended (normal < 0.034 ng/mL): 0.325 --> 1.480 --> 2.330 --> 1.660 --> 0.169 ng/mL   PAD (peripheral artery disease) (HCC)    a.) s/p PTA and stenting SMA 11/13/2020; b.) s/p PTA and stenting of RIGHT SFA and popliteal 04/12/2021; c.) s/p PTA and kissing balloon stents to BILATERAL CIAs 05/28/2021   Respiratory failure with hypoxia (HCC)    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  Assessment: 72 yo female presented by EMS to the ED due to weakness and confusion.  Pharmacy consulted to initiate heparin infusion for arterial clot.   Goal of Therapy:  Heparin level 0.3-0.7 units/ml Monitor platelets by anticoagulation protocol: Yes   Plan:  Give 3000 units bolus x 1 Start heparin infusion at 750 units/hr Check anti-Xa level in 6 hours and daily while on heparin Continue to monitor  H&H and platelets  Barrie Folk, PharmD 01/13/2023,9:27 AM

## 2023-01-13 NOTE — Assessment & Plan Note (Addendum)
CBG on admission elevated 605 with significant acidosis, elevated BHA and ketonuria.  - Insulin infusion per Endo tool - S/p 2 L bolus - Continue LR at 125 cc/h - BMP every 4 hours and BHA every 8 hours - Will need to keep on insulin infusion until improvements in mental status, likely beyond when the gap will close

## 2023-01-13 NOTE — Plan of Care (Signed)
ET tube pulled back and secured at 20 cm per order

## 2023-01-13 NOTE — Consult Note (Signed)
ANTICOAGULATION CONSULT NOTE  Pharmacy Consult for Heparin Infusion Indication: arterial clot  Patient Measurements: Height: 5' (152.4 cm) Weight: 45.6 kg (100 lb 8.5 oz) IBW/kg (Calculated) : 45.5 Heparin Dosing Weight: 45.6 kg  Labs: Recent Labs    01/13/23 0828 01/13/23 1001 01/13/23 1359  HGB 14.4  --   --   HCT 46.9*  --   --   PLT 337  --   --   APTT 27  --   --   LABPROT 16.1*  --   --   INR 1.3*  --   --   HEPARINUNFRC  --  <0.10* >1.10*  CREATININE 1.07*  --   --     Estimated Creatinine Clearance: 35.6 mL/min (A) (by C-G formula based on SCr of 1.07 mg/dL (H)).  Medical History: Past Medical History:  Diagnosis Date   (HFpEF) heart failure with preserved ejection fraction (HCC)    Angina pectoris (HCC)    Anxiety    Aortic atherosclerosis (HCC)    Basal cell carcinoma    Bilateral carotid artery disease (HCC)    a.) carotid doppler 03/18/2021: 1-39% RICA, 40-59% LICA   Bipolar disorder (HCC)    Cervicalgia    Chronic mesenteric ischemia (HCC)    a.) s/p PTA 11/13/2020 --> 6 x 16 mm lifestream ballon expanding stent to SMA   COPD (chronic obstructive pulmonary disease) (HCC)    Coronary artery disease 03/20/2014   a.) LHC/PCI 03/20/2014: 70% mLAD (2.5 x 28 mm Xience Alpine DES), 70% dLCx, 70% dRCA; b.) LHC 01/25/2016: 90% ISR mLAD --> mLAD dissection with in stent --> 2.75 x 22 mm Resolute DES; c.) R/LHC 09/29/2016: 50% ISR mLAD, 70% dLAD, mPA 37, PCWP 24, AO sat 90%, PA sat 46%, CO 3.7, CI 2.46 - med mgmt   DDD (degenerative disc disease), cervical    Depression    Diabetic polyneuropathy (HCC)    GERD (gastroesophageal reflux disease)    Hyperlipidemia    Hypertension    Long term current use of antithrombotics/antiplatelets    a.) on DAPT (ASA + clopidogrel)   Memory loss    NSTEMI (non-ST elevated myocardial infarction) (HCC) 07/2016   a.) troponins trended (normal < 0.034 ng/mL): 0.325 --> 1.480 --> 2.330 --> 1.660 --> 0.169 ng/mL   PAD  (peripheral artery disease) (HCC)    a.) s/p PTA and stenting SMA 11/13/2020; b.) s/p PTA and stenting of RIGHT SFA and popliteal 04/12/2021; c.) s/p PTA and kissing balloon stents to BILATERAL CIAs 05/28/2021   Respiratory failure with hypoxia (HCC)    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  Assessment: 72 yo female presented by EMS to the ED due to weakness and confusion.  Pharmacy consulted to initiate heparin infusion for arterial clot.   0521 1359 HL > 1.1, suprathera; 750 un/hr Heparin level was drawn 2.5 hours early  Goal of Therapy:  Heparin level 0.3-0.7 units/ml Monitor platelets by anticoagulation protocol: Yes   Plan:  --Heparin level is supratherapeutic. Drawn 2.5 hours early so not at steady state. However, given degree of level elevation, will go ahead and adjust rate based on available data --  Hold heparin infusion x 1 hour and then re-start infusion at 550 units/hr --Re-check HL 6 hours from re-initiation of infusion --Daily CBC per protocol while on IV heparin  Tressie Ellis 01/13/2023,2:36 PM

## 2023-01-13 NOTE — ED Triage Notes (Signed)
Pt comes via EMS from home with c/o feeling sick for few days. Pt has posibble UTI. Pt does have amputation to left. Pt normally gets around at home. Pt states some vomiting.   BP-87/50 and then given 600 fluids HR-150 CBG-407 RR-30 T-95.4 rectally  MD at bedside

## 2023-01-13 NOTE — Assessment & Plan Note (Signed)
Multifactorial in the setting of DKA, and likely poor p.o. intake given altered mental status.  - IV fluids as ordered - Trend renal indices - Foley catheter placed in the ED, will continue for now - Avoid nephrotoxic agents

## 2023-01-13 NOTE — H&P (Signed)
History and Physical    Patient: Heather [name] WUJ:[id number] DOB: [birth year] DOA: 01/13/2023 DOS: the patient was seen and examined on 01/13/2023 PCP: Toy Cookey, FNP  Patient coming from: Home  Chief Complaint:  Chief Complaint  Patient presents with   Tachycardia   HPI: Heather [name] is a 72 y.o. female with medical history significant of HFpEF, CAD s/p DES to LAD (2015), rheumatic heart disease s/p TAVR, T2DM, HTN, HLD, PAD s/p multiple stents and left AKA, COPD, who presents to the ED with c/o altered mental status.   History is limited by patient's altered mental status.  Patient's husband at bedside able to provide limited history only.  Per patient's husband, there has been a slow decline over the last few weeks, however patient became suddenly altered yesterday evening and began to speak nonsensically.  He denies noticing any fever, shortness of breath or cough.  He notes that her right lower extremity has become paler over the last several weeks and he has not seen her move it recently but moves it for her intermittently.  ED course: On arrival to the ED, patient was hypertensive at 129/92 with heart rate of 149.  She was saturating at 100% on room air.  She was afebrile at 97.6.  Initial workup with VBG demonstrating pH of 7.14, pCO2 less than 18, glucose of 605, bicarb less than 7, potassium 3.2, creatinine 1.07 with GFR 56, albumin 2.8, WBC 21.6, lactic acid 2.9, INR 1.3, and urinalysis with ketonuria.  CT of the head with no acute intracranial abnormalities.  Chest x-ray with concerns for pneumonia.  Patient started on IV antibiotics for pneumonia.  Patient started on IV insulin for DKA.  Vascular surgery consulted for ischemic limb; patient started on IV heparin.  TRH contacted for admission.  Review of Systems: As mentioned in the history of present illness. All other systems reviewed and are negative.  Past Medical History:  Diagnosis Date   (HFpEF) heart failure with  preserved ejection fraction (HCC)    Angina pectoris (HCC)    Anxiety    Aortic atherosclerosis (HCC)    Basal cell carcinoma    Bilateral carotid artery disease (HCC)    a.) carotid doppler 03/18/2021: 1-39% RICA, 40-59% LICA   Bipolar disorder (HCC)    Cervicalgia    Chronic mesenteric ischemia (HCC)    a.) s/p PTA 11/13/2020 --> 6 x 16 mm lifestream ballon expanding stent to SMA   COPD (chronic obstructive pulmonary disease) (HCC)    Coronary artery disease 03/20/2014   a.) LHC/PCI 03/20/2014: 70% mLAD (2.5 x 28 mm Xience Alpine DES), 70% dLCx, 70% dRCA; b.) LHC 01/25/2016: 90% ISR mLAD --> mLAD dissection with in stent --> 2.75 x 22 mm Resolute DES; c.) R/LHC 09/29/2016: 50% ISR mLAD, 70% dLAD, mPA 37, PCWP 24, AO sat 90%, PA sat 46%, CO 3.7, CI 2.46 - med mgmt   DDD (degenerative disc disease), cervical    Depression    Diabetic polyneuropathy (HCC)    GERD (gastroesophageal reflux disease)    Hyperlipidemia    Hypertension    Long term current use of antithrombotics/antiplatelets    a.) on DAPT (ASA + clopidogrel)   Memory loss    NSTEMI (non-ST elevated myocardial infarction) (HCC) 07/2016   a.) troponins trended (normal < 0.034 ng/mL): 0.325 --> 1.480 --> 2.330 --> 1.660 --> 0.169 ng/mL   PAD (peripheral artery disease) (HCC)    a.) s/p PTA and stenting SMA 11/13/2020; b.) s/p PTA and stenting  of RIGHT SFA and popliteal 04/12/2021; c.) s/p PTA and kissing balloon stents to BILATERAL CIAs 05/28/2021   Respiratory failure with hypoxia (HCC)    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   a.) 23 mm Sapien 3 via suprasternal approach   Schizophrenia (HCC)    Severe aortic stenosis    a.) s/p TAVR 09/30/2016; 23 mm Sapien 3 via a suprasternal approach   Type 2 diabetes mellitus treated with insulin (HCC)    Vertigo    Past Surgical History:  Procedure Laterality Date   AMPUTATION Left 08/14/2022   Procedure: ABOVE KNEE AMPUTATION;  Surgeon: Maeola Harman, MD;  Location: [facility] OR;  Service: Vascular;  Laterality: Left;   AORTIC VALVE REPLACEMENT  09/30/2016   Procedure: TRANSCATHETER AORTIC VALVE REPAIR   APPLICATION OF WOUND VAC Bilateral 08/14/2022   Procedure: APPLICATION OF WOUND VAC;  Surgeon: Maeola Harman, MD;  Location: [facility] OR;  Service: Vascular;  Laterality: Bilateral;   BLADDER SURGERY     CATARACT EXTRACTION, BILATERAL     CHOLECYSTECTOMY     COLONOSCOPY     CORONARY ANGIOPLASTY WITH STENT PLACEMENT  03/20/2014   Procedure: CORONARY ANGIOPLASTY WITH STENT PLACEMENT; Location: [facility]; Surgeon: Willene Hatchet, MD   CORONARY ANGIOPLASTY WITH STENT PLACEMENT Left 01/25/2016   Procedure: CORONARY ANGIOPLASTY WITH STENT PLACEMENT; Location: [facility]; Surgeon: Willene Hatchet, MD   ENDARTERECTOMY FEMORAL Bilateral 08/04/2022   Procedure: BILATERAL FEMORAL ENDARTERECTOMY;  Surgeon: Maeola Harman, MD;  Location: [facility] OR;  Service: Vascular;  Laterality: Bilateral;   FASCIECTOMY Bilateral 08/04/2022   Procedure: BILATERAL LOWER EXTREMITY FASCIECTOMIES;  Surgeon: Maeola Harman, MD;  Location: [facility] OR;  Service: Vascular;  Laterality: Bilateral;   FOOT SURGERY Right    GROIN DEBRIDEMENT Bilateral 08/14/2022   Procedure: GROIN DEBRIDEMENT;  Surgeon: Maeola Harman, MD;  Location: [facility] OR;  Service: Vascular;  Laterality: Bilateral;   GROIN DEBRIDEMENT Bilateral 09/16/2022   Procedure: GROIN DEBRIDEMENT POSSIBLE VAC PLACMENT;  Surgeon: Victorino Sparrow, MD;  Location: [facility] OR;  Service: Vascular;  Laterality: Bilateral;   HEMORRHOID SURGERY     INSERTION OF ILIAC STENT Bilateral 08/04/2022   Procedure: INSERTION OF BILATERAL ILIAC STENTS;  Surgeon: Maeola Harman, MD;  Location: [facility] OR;  Service: Vascular;  Laterality: Bilateral;   IRRIGATION AND DEBRIDEMENT HEMATOMA Left 07/08/2014   GROIN   LOWER EXTREMITY ANGIOGRAM Bilateral 08/04/2022   Procedure: LOWER EXTREMITY ANGIOGRAM;  Surgeon: Maeola Harman, MD;  Location: [facility] OR;  Service: Vascular;  Laterality: Bilateral;   LOWER EXTREMITY ANGIOGRAPHY Right 04/12/2021   Procedure: Lower Extremity Angiography;  Surgeon: Renford Dills, MD;  Location: [facility] INVASIVE CV LAB;  Service: Cardiovascular;  Laterality: Right;   LOWER EXTREMITY ANGIOGRAPHY Left 05/28/2021   Procedure: LOWER EXTREMITY ANGIOGRAPHY;  Surgeon: Renford Dills, MD;  Location: [facility] INVASIVE CV LAB;  Service: Cardiovascular;  Laterality: Left;   LOWER EXTREMITY ANGIOGRAPHY Left 03/25/2022   Procedure: Lower Extremity Angiography;  Surgeon: Renford Dills, MD;  Location: [facility] INVASIVE CV LAB;  Service: Cardiovascular;  Laterality: Left;   PATCH ANGIOPLASTY  08/04/2022   Procedure: LEFT FEMORAL PATCH ANGIOPLASTY USING XENOSURE BIOLOGIC PATCH;  Surgeon: Maeola Harman, MD;  Location: [facility] OR;  Service: Vascular;;   PSEUDOANEURYSM REPAIR Left 06/30/2014   FEMORAL ARTERY   RIGHT/LEFT HEART CATH AND CORONARY ANGIOGRAPHY Bilateral 09/29/2016   Procedure: RIGHT/LEFT HEART CATH AND CORONARY ANGIOGRAPHY; Location [facility]; Surgeon: Willene Hatchet, MD   THROMBECTOMY FEMORAL ARTERY Bilateral 08/04/2022  Procedure: BILATERAL LOWER EXTREMITY THROMBECTOMY;  Surgeon: Maeola Harman, MD;  Location: [facility] OR;  Service: Vascular;  Laterality: Bilateral;   TUBAL LIGATION     VISCERAL ANGIOGRAPHY N/A 11/13/2020   Procedure: VISCERAL ANGIOGRAPHY;  Surgeon: Renford Dills, MD;  Location: [facility] INVASIVE CV LAB;  Service: Cardiovascular;  Laterality: N/A;   Social History:  reports that she has been smoking cigarettes. She has a 72.00 pack-year smoking history. She has never used smokeless tobacco. She reports that she does not currently use alcohol. She reports that she does not currently use drugs.  Allergies  Allergen Reactions   Iodinated Contrast Media Hives   Tramadol Hcl Other (See Comments)    SEIZURE   Sulfa Antibiotics Hives   Shellfish  Allergy Nausea And Vomiting and Rash    Scallops specifically     Family History  Problem Relation Age of Onset   Mental illness Mother    Breast cancer Neg Hx     Prior to Admission medications   Medication Sig Start Date End Date Taking? Authorizing Provider  acetaminophen (TYLENOL) 325 MG tablet Take 2 tablets (650 mg total) by mouth every 6 (six) hours as needed for mild pain (or Fever >/= 101). 09/29/22   Leroy Sea, MD  albuterol (VENTOLIN HFA) 108 (90 Base) MCG/ACT inhaler Inhale 2 puffs into the lungs every 4 (four) hours as needed for wheezing or shortness of breath.    [provider]  amLODipine (NORVASC) 10 MG tablet Take 1 tablet (10 mg total) by mouth daily. Patient not taking: Reported on 10/27/2022 09/29/22 09/29/23  Leroy Sea, MD  atorvastatin (LIPITOR) 80 MG tablet Take 80 mg by mouth at bedtime.    [provider]  carvedilol (COREG) 6.25 MG tablet Take 1 tablet (6.25 mg total) by mouth 2 (two) times daily with a meal. 09/22/22 10/22/22  Zigmund Daniel., MD  cetirizine (ZYRTEC) 10 MG tablet Take 10 mg by mouth daily.    [provider]  cholecalciferol (VITAMIN D3) 25 MCG (1000 UNIT) tablet Take 1,000 Units by mouth daily.    [provider]  clopidogrel (PLAVIX) 75 MG tablet Take 1 tablet (75 mg total) by mouth daily. 11/15/20   Regalado, Belkys A, MD  diclofenac Sodium (VOLTAREN) 1 % GEL Apply 2 g topically 4 (four) times daily as needed. 10/31/22   Enedina Finner, MD  escitalopram (LEXAPRO) 10 MG tablet Take 1 tablet (10 mg total) by mouth daily. 09/22/22 10/27/22  Zigmund Daniel., MD  hydrALAZINE (APRESOLINE) 50 MG tablet Take 1 tablet (50 mg total) by mouth 3 (three) times daily. 09/29/22 10/29/22  Leroy Sea, MD  insulin aspart (NOVOLOG) 100 UNIT/ML FlexPen Before each meal 3 times a day, 140-199 - 2 units, 200-250 - 4 units, 251-299 - 8 units,  300-349 - 10 units,  350 or above 12 units. I 09/29/22   Leroy Sea,  MD  insulin aspart (NOVOLOG) 100 UNIT/ML injection Inject 3 Units into the skin 3 (three) times daily with meals. Hold for NPO or consuming less than 50% of meals Patient taking differently: Inject 3 Units into the skin See admin instructions. Inject 3 units subcutaneously with meals. HOLD for NPO or consuming < 50% of meals. 09/22/22   Zigmund Daniel., MD  insulin glargine (LANTUS) 100 UNIT/ML injection Inject 0.1 mLs (10 Units total) into the skin 2 (two) times daily. 09/29/22   Leroy Sea, MD  levETIRAcetam (KEPPRA) 500  MG tablet Take 1 tablet (500 mg total) by mouth 2 (two) times daily. 10/06/22   Marinda Elk, MD  mirtazapine (REMERON) 7.5 MG tablet Take 1 tablet (7.5 mg total) by mouth at bedtime. 09/22/22 10/22/22  Zigmund Daniel., MD  Multiple Vitamins-Minerals (MULTIVITAMIN WITH MINERALS) tablet Take 1 tablet by mouth daily. Patient not taking: Reported on 10/27/2022    [provider]  nicotine (NICODERM CQ - DOSED IN MG/24 HOURS) 14 mg/24hr patch Place 1 patch (14 mg total) onto the skin daily. 10/31/22   Enedina Finner, MD  pantoprazole (PROTONIX) 40 MG tablet Take 1 tablet (40 mg total) by mouth daily. 09/22/22 10/27/22  Zigmund Daniel., MD  polyethylene glycol (MIRALAX / GLYCOLAX) 17 g packet Take 17 g by mouth daily. Titrate as needed for constipation. Patient taking differently: Take 17 g by mouth daily. 09/22/22   Zigmund Daniel., MD  senna-docusate (SENOKOT-S) 8.6-50 MG tablet Take 1 tablet by mouth daily. 09/30/22   Leroy Sea, MD  traZODone (DESYREL) 50 MG tablet Take 0.5 tablets (25 mg total) by mouth at bedtime as needed for sleep. Patient taking differently: Take 50 mg by mouth at bedtime as needed for sleep. 09/22/22 10/27/22  Zigmund Daniel., MD    Physical Exam: Vitals:   01/13/23 1040 01/13/23 1100 01/13/23 1130 01/13/23 1230  BP:  (!) 128/93 (!) 124/94 126/71  Pulse:  (!) 143 (!) 137 (!) 135  Resp:  (!) 27 (!) 27 (!) 32   Temp: (!) 97.5 F (36.4 C)     TempSrc: Bladder     SpO2:  100% 100% 100%  Weight:      Height:       Physical Exam Vitals and nursing note reviewed.  Constitutional:      Appearance: She is ill-appearing and toxic-appearing. She is not diaphoretic.     Comments: Alert but altered and not following commands  HENT:     Head: Normocephalic and atraumatic.     Mouth/Throat:     Mouth: Mucous membranes are dry.     Pharynx: Oropharynx is clear.  Eyes:     Extraocular Movements: Extraocular movements intact.     Conjunctiva/sclera: Conjunctivae normal.     Pupils: Pupils are equal, round, and reactive to light.  Cardiovascular:     Rate and Rhythm: Regular rhythm. Tachycardia present.     Heart sounds: No murmur heard. Pulmonary:     Effort: Tachypnea present. No accessory muscle usage.     Breath sounds: Rhonchi (Right base only) present. No wheezing or rales.  Abdominal:     General: Bowel sounds are absent.     Palpations: Abdomen is soft.     Tenderness: There is abdominal tenderness (Patient groaning with palpation, predominantly in the bilateral lower quadrants).  Musculoskeletal:     Right lower leg: No edema.  Skin:    General: Skin is dry.     Coloration: Skin is pale.     Comments: Very cool to touch, right lower extremity.  Bilateral lower abdominal mottling with cool to touch.  Bilateral groin incisions healed with no surround erythema  Neurological:     Mental Status: She is disoriented.     Comments:  Patient is alert but not following commands.  Moving bilateral upper extremities against gravity.  Right lower extremity immobile including with noxious stimuli.    Data Reviewed: CBC with WBC of 21.6, hemoglobin 14.4, MCV 90, platelets of 337 CMP  with sodium of 136, potassium 3.2, bicarb less than 7, glucose 605, creatinine 1.07, albumin 2.8, total protein 6.3, total bilirubin 2.3 GFR 56 Lactic acid elevated 2.9 with downtrend to 2.5. INR elevated at 1.3. BHA  elevated above 8 VBG with pH of 7.14 and pCO2 less than 18 Urinalysis with hematuria, glucosuria, ketonuria  EKG personally reviewed.  Narrow complex regular tachycardia consistent with sinus tachycardia.  T wave inversion in leads II, 3, aVF, and V4 through V6.  No ST elevation or depression.  CT Head Wo Contrast  Result Date: 01/13/2023 CLINICAL DATA:  Altered mental status EXAM: CT HEAD WITHOUT CONTRAST TECHNIQUE: Contiguous axial images were obtained from the base of the skull through the vertex without intravenous contrast. RADIATION DOSE REDUCTION: This exam was performed according to the departmental dose-optimization program which includes automated exposure control, adjustment of the mA and/or kV according to patient size and/or use of iterative reconstruction technique. COMPARISON:  None Available. FINDINGS: Brain: No evidence of acute infarction, hemorrhage, hydrocephalus, extra-axial collection or mass lesion/mass effect. 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 Vascular: No hyperdense vessel or unexpected calcification. Skull: Normal. Negative for fracture or focal lesion. Sinuses/Orbits: No middle ear or mastoid effusion. Paranasal sinuses are clear. Bilateral lens replacement. Orbits are otherwise unremarkable. Other: None. IMPRESSION: 1. No acute intracranial abnormality. 2. 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 Electronically Signed   By: Lorenza Cambridge M.D.   On: 01/13/2023 09:47   DG Chest Port 1 View  Result Date: 01/13/2023 CLINICAL DATA:  Sepsis EXAM: PORTABLE CHEST 1 VIEW COMPARISON:  10/17/2022 FINDINGS: The heart size and mediastinal contours are within normal limits. Prior TAVR. Coronary artery stent. Aortic atherosclerosis. Increased interstitial markings bilaterally with small developing opacity in the right lower lobe. No pleural  effusion or pneumothorax. The visualized skeletal structures are unremarkable. IMPRESSION: Increased interstitial markings bilaterally with small developing opacity in the right lower lobe. Findings suspicious for atypical/viral infection. Electronically Signed   By: Duanne Guess D.O.   On: 01/13/2023 09:04    Results are pending, will review when available.  Assessment and Plan:  Acute encephalopathy Patient is presenting to the ED with acute encephalopathy of uncertain timeline. Likely multi-factorial in the setting of DKA, limb ischemia, AKI and severe acidosis. Given lack of movement of right leg and prior CVA, there is concern for central process as well, although it may second to ischemia only.  CT head without acute abnormalities  - Will monitor closely for respiratory compromise at high risk for decompensation - N.p.o. - MRI brain without contrast stat given patient is on IV heparin  DKA (diabetic ketoacidosis) (HCC) CBG on admission elevated 605 with significant acidosis, elevated BHA and ketonuria.  - Insulin infusion per Endo tool - S/p 2 L bolus - Continue LR at 125 cc/h - BMP every 4 hours and BHA every 8 hours - Will need to keep on insulin infusion until improvements in mental status, likely beyond when the gap will close  Critical limb ischemia of left lower extremity (HCC) History of extensive PAD with multiple stents and subsequent left AKA presenting with pale right lower extremity with nondopplerable pulses.  Vascular surgery consulted.  I am also concerned that her abdomen presents with significant mottling; will discuss with vascular surgery concerns for abdominal occlusion.  - Vascular surgery consulted; appreciate their recommendations - Continue heparin infusion per pharmacy dosing  CAP (community acquired pneumonia) Chest x-ray with bilateral interstitial markings with small opacity in the right lower lobe, most concerning for aspiration given altered mental  status.  Differential also includes viral versus typical bacterial etiologies.  - S/p vancomycin and cefepime - Transition to Rocephin and azithromycin.  No indication at this time for anaerobic coverage given lack of empyema or effusion - Continuous pulse oximetry - Legionella and strep pneumo antigens pending - COVID-19 PCR pending  AKI (acute kidney injury) (HCC) Multifactorial in the setting of DKA, and likely poor p.o. intake given altered mental status.  - IV fluids as ordered - Trend renal indices - Foley catheter placed in the ED, will continue for now - Avoid nephrotoxic agents  Acidosis, metabolic, with respiratory acidosis Bicarb less than 7 and pH of 7.14 on VBG in the setting of both metabolic and respiratory acidosis.  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  Likely contributing to altered mental status at this time.  - Monitor for evidence of delirium - Hold home centrally acting medications  Hypertension, benign - Hold home antihypertensives in the setting of n.p.o. status  COPD (chronic obstructive pulmonary disease) (HCC) No wheezing on examination or reported history of increased cough, however will continue DuoNebs every 6 hours given pneumonia.  - DuoNebs every 6 hours - No clear indication for steroids at this time; will hold off given DKA  Coronary artery disease CAD s/p DES to LAD.  On atorvastatin and Plavix at home.  Currently holding due to n.p.o. status.  EKG with nonspecific changes.  - Given critical illness, troponin pending  (HFpEF) heart failure with preserved ejection fraction [facility]) Patient appears hypovolemic at this time.  No indication for diuretics.  - Daily weights  Advance Care Planning:   Code Status: Full Code.   At this time, patient to remain full code as there is concern patient's husband at bedside has underlying dementia.  Will discuss further with patient's daughters.  Consults: Vascular surgery  Family Communication: Patient's husband updated at bedside  Severity of Illness: The appropriate patient status for this patient is INPATIENT. Inpatient status is judged to be reasonable and necessary in order to provide the required intensity of service to ensure the patient's safety. The patient's presenting symptoms, physical exam findings, and initial radiographic and laboratory data in the context of their chronic comorbidities is felt to place them at high risk for further clinical deterioration. Furthermore, it is not anticipated that the patient will be medically stable for discharge from the hospital within 2 midnights of admission.   * I certify that at the point of admission it is my clinical judgment that the patient will require inpatient hospital care spanning beyond 2 midnights from the point of admission due to high intensity of service, high risk for further deterioration and high frequency of surveillance required.*  Author: Verdene Lennert, MD 01/13/2023 1:43 PM  For on call review [email or web address].

## 2023-01-13 NOTE — ED Notes (Signed)
Patient transported to CT 

## 2023-01-13 NOTE — Progress Notes (Addendum)
Pharmacy Antibiotic Note  Heather [name] is a 72 y.o. female admitted on 01/13/2023 with sepsis.  Pharmacy has been consulted for vancomycin and cefepime dosing.  Received vancomycin 1,000 mg x 1 on 5/21 @1100   Plan: Vancomycin 750 mg every 24 hours Goal AUC 400-550 Estimated AUC 518.5, Cmin 12.7  Zosyn 3.375 grams every 8 hours  (4 hour infusion)  Height: 5' (152.4 cm) Weight: 45.6 kg (100 lb 8.5 oz) IBW/kg (Calculated) : 45.5  Temp (24hrs), Avg:98.1 F (36.7 C), Min:96.9 F (36.1 C), Max:99.4 F (37.4 C)  Recent Labs  Lab 01/13/23 0828 01/13/23 0955 01/13/23 1359 01/13/23 1603  WBC 21.6*  --   --   --   CREATININE 1.07*  --  0.86 0.71  LATICACIDVEN 2.9* 2.5*  --   --     Estimated Creatinine Clearance: 47.7 mL/min (by C-G formula based on SCr of 0.71 mg/dL).    Allergies  Allergen Reactions   Iodinated Contrast Media Hives   Tramadol Hcl Other (See Comments)    SEIZURE   Sulfa Antibiotics Hives   Shellfish Allergy Nausea And Vomiting and Rash    Scallops specifically     Antimicrobials this admission: cefepime 5/21 >>  Vancomycin 5/21 >>   Dose adjustments this admission: N/a  Microbiology results: 5/21 BCx: in process 5/21 MRSA PCR: ordered  Thank you for allowing pharmacy to be a part of this patient's care.  Jaynie Bream 01/13/2023 7:08 PM

## 2023-01-13 NOTE — Consult Note (Signed)
NAME:  Heather [name], MRN:  [id number], DOB:  1950/08/26, LOS: 0 ADMISSION DATE:  01/13/2023 CHIEF COMPLAINT:  resp distress   History of Present Illness:  72 y.o. female with medical history significant of HFpEF, CAD s/p DES to LAD (2015), rheumatic heart disease s/p TAVR, T2DM, HTN, HLD, PAD s/p multiple stents and left AKA, COPD, who presents to the ED with c/o altered mental status.    Patient with severe metabolic encephalopathy patient's husband, there has been a slow decline over the last few weeks, however patient became suddenly altered yesterday evening and began to speak without sense  her right lower extremity has become paler over the last several weeks and he has not seen her move it recently but moves it for her intermittently.   ED course: On arrival to the ED, patient was hypertensive at 129/92 with heart rate of 149.  She was saturating at 100% on room air.  She was afebrile at 97.6.  Initial workup with VBG demonstrating pH of 7.14, pCO2 less than 18, glucose of 605, bicarb less than 7, potassium 3.2, creatinine 1.07 with GFR 56, albumin 2.8, WBC 21.6, lactic acid 2.9, INR 1.3, and urinalysis with ketonuria.  CT of the head with no acute intracranial abnormalities.  Chest x-ray with concerns for pneumonia.  Patient started on IV antibiotics for pneumonia.  Patient started on IV insulin for DKA.  Vascular surgery consulted for ischemic limb; patient started on IV heparin.  TRH contacted for admission, PCCM asked to Consult  Multiple admission in the past Cyanosis from waist down Severe ABD pain Critical limb ischemia   Significant Hospital Events: Including procedures, antibiotic start and stop dates in addition to other pertinent events   5/21 ICU admission for acidosis and encephalopathy     Micro Data:  CULTURES PENDING  Antimicrobials:   Antibiotics Given (last 72 hours)     Date/Time Action Medication Dose Rate   01/13/23 1014 New Bag/Given   ceFEPIme  (MAXIPIME) 2 g in sodium chloride 0.9 % 100 mL IVPB 2 g 200 mL/hr   01/13/23 1101 New Bag/Given   vancomycin (VANCOCIN) IVPB 1000 mg/200 mL premix 1,000 mg 200 mL/hr   01/13/23 1414 New Bag/Given   azithromycin (ZITHROMAX) 500 mg in sodium chloride 0.9 % 250 mL IVPB 500 mg 250 mL/hr            Interim History / Subjective:  Seen and evaluated at bedside Mottled from waist down Cyanosis of RT leg, Cyanosis of left Stump(s/p L AKA)     Objective   Blood pressure 117/72, pulse (!) 139, temperature 99.4 F (37.4 C), temperature source Bladder, resp. rate (!) 27, height 5' (1.524 m), weight 45.6 kg, SpO2 100 %.       No intake or output data in the 24 hours ending 01/13/23 1848 Filed Weights   01/13/23 0900 01/13/23 0926  Weight: 45.6 kg 45.6 kg     REVIEW OF SYSTEMS  PATIENT IS UNABLE TO PROVIDE COMPLETE REVIEW OF SYSTEMS DUE TO SEVERE CRITICAL ILLNESS   PHYSICAL EXAMINATION:  GENERAL:critically ill appearing, +resp distress EYES: Pupils equal, round, reactive to light.  No scleral icterus.  MOUTH: Moist mucosal membrane.  NECK: Supple.  PULMONARY: Lungs clear to auscultation, +rhonchi, +wheezing CARDIOVASCULAR: S1 and S2.  Regular rate and rhythm GASTROINTESTINAL: Soft, +tender, +distended. NEG bowel sounds.  MUSCULOSKELETAL:+cyanosis, mottled skin, left AKA NEUROLOGIC: obtunded,sedated SKIN:abnormal, cold to touch, Capillary refill delayed  Pulses present bilaterally   Labs/imaging that I havepersonally  reviewed  (right click and "Reselect all SmartList Selections" daily)      ASSESSMENT AND PLAN SYNOPSIS  72 yo white female with multiple medical issues with severe metabolic acidosis due to severe DKA with severe metabolic encephalopathy with acute cyanosis of lower half of her body mottled skin with critical limb ischemia active smoker, possible aspiration pneumonia  HIGH RISK FOR INTUBATION AND CARDIAC ARREST  CARDIAC ICU monitoring   RENAL-SEVERE  ACIDOSIS START BICARB INFUSION -Avoid nephrotoxic agents -Follow urine output, BMP -Ensure adequate renal perfusion, optimize oxygenation -Renal dose medications  No intake or output data in the 24 hours ending 01/13/23 1848   NEUROLOGY Acute  metabolic encephalopathy  SEPTIC SHOCK SOURCE-LIMB ISCHEMIA, PNEUMONIA -use vasopressors to keep MAP>65 as needed -follow ABG and LA -follow up cultures -emperic ABX -consider stress dose steroids -aggressive IV fluid resuscitation  INFECTIOUS DISEASE -continue antibiotics as prescribed -follow up cultures   ENDO - ICU hypoglycemic\Hyperglycemia protocol -check FSBS per protocol   GI GI PROPHYLAXIS as indicated  NUTRITIONAL STATUS DIET-->OG TO SUCTION Constipation protocol as indicated   ELECTROLYTES -follow labs as needed -replace as needed -pharmacy consultation and following   ACUTE ANEMIA- TRANSFUSE AS NEEDED CONSIDER TRANSFUSION  IF HGB<7      Best practice (right click and "Reselect all SmartList Selections" daily)  Diet: NPO Central venous access:  N/A Arterial line:  N/A Foley:  N/A Mobility:  bed rest  Code Status:  FULL Disposition:ICU  Labs   CBC: Recent Labs  Lab 01/13/23 0828  WBC 21.6*  NEUTROABS 17.7*  HGB 14.4  HCT 46.9*  MCV 90.2  PLT 337    Basic Metabolic Panel: Recent Labs  Lab 01/13/23 0828 01/13/23 1359 01/13/23 1603  NA 136 138 137  K 3.2* 4.3 5.0  CL 103 113* 113*  CO2 <7* <7* <7*  GLUCOSE 605* 377* 268*  BUN 21 19 20   CREATININE 1.07* 0.86 0.71  CALCIUM 8.8* 7.8* 7.7*  MG  --  1.5*  --   PHOS  --  3.3  --    GFR: Estimated Creatinine Clearance: 47.7 mL/min (by C-G formula based on SCr of 0.71 mg/dL). Recent Labs  Lab 01/13/23 0828 01/13/23 0955  WBC 21.6*  --   LATICACIDVEN 2.9* 2.5*    Liver Function Tests: Recent Labs  Lab 01/13/23 0828  AST 23  ALT 18  ALKPHOS 104  BILITOT 2.3*  PROT 6.3*  ALBUMIN 2.8*   No results for input(s):  "LIPASE", "AMYLASE" in the last 168 hours. No results for input(s): "AMMONIA" in the last 168 hours.  ABG    Component Value Date/Time   PHART 7.45 08/16/2022 1755   PCO2ART 44 08/16/2022 1755   PO2ART 82 (L) 08/16/2022 1755   HCO3 3.1 (L) 01/13/2023 0955   TCO2 22 08/08/2022 0434   ACIDBASEDEF 23.3 (H) 01/13/2023 0955   O2SAT 87.3 01/13/2023 0955     Coagulation Profile: Recent Labs  Lab 01/13/23 0828  INR 1.3*    Cardiac Enzymes: No results for input(s): "CKTOTAL", "CKMB", "CKMBINDEX", "TROPONINI" in the last 168 hours.  HbA1C: Hgb A1c MFr Bld  Date/Time Value Ref Range Status  08/05/2022 04:00 AM 12.9 (H) 4.8 - 5.6 % Final    Comment:    (NOTE)         Prediabetes: 5.7 - 6.4         Diabetes: >6.4         Glycemic control for adults with diabetes: <7.0   07/02/2022 12:11  PM 12.0 (H) 4.8 - 5.6 % Final    Comment:    (NOTE) Pre diabetes:          5.7%-6.4%  Diabetes:              >6.4%  Glycemic control for   <7.0% adults with diabetes     CBG: Recent Labs  Lab 01/13/23 1337 01/13/23 1442 01/13/23 1546 01/13/23 1644 01/13/23 1757  GLUCAP 336* 322* 267* 198* 232*     Past Medical History:  She,  has a past medical history of (HFpEF) heart failure with preserved ejection fraction (HCC), Angina pectoris (HCC), Anxiety, Aortic atherosclerosis (HCC), Basal cell carcinoma, Bilateral carotid artery disease (HCC), Bipolar disorder (HCC), Cervicalgia, Chronic mesenteric ischemia (HCC), COPD (chronic obstructive pulmonary disease) (HCC), Coronary artery disease (03/20/2014), DDD (degenerative disc disease), cervical, Depression, Diabetic polyneuropathy (HCC), GERD (gastroesophageal reflux disease), Hyperlipidemia, Hypertension, Long term current use of antithrombotics/antiplatelets, Memory loss, NSTEMI (non-ST elevated myocardial infarction) (HCC) (07/2016), PAD (peripheral artery disease) (HCC), Respiratory failure with hypoxia (HCC), S/P TAVR (transcatheter aortic  valve replacement) (09/30/2016), Schizophrenia (HCC), Severe aortic stenosis, Type 2 diabetes mellitus treated with insulin (HCC), and Vertigo.   Surgical History:   Past Surgical History:  Procedure Laterality Date   AMPUTATION Left 08/14/2022   Procedure: ABOVE KNEE AMPUTATION;  Surgeon: Maeola Harman, MD;  Location: [facility] OR;  Service: Vascular;  Laterality: Left;   AORTIC VALVE REPLACEMENT  09/30/2016   Procedure: TRANSCATHETER AORTIC VALVE REPAIR   APPLICATION OF WOUND VAC Bilateral 08/14/2022   Procedure: APPLICATION OF WOUND VAC;  Surgeon: Maeola Harman, MD;  Location: [facility] OR;  Service: Vascular;  Laterality: Bilateral;   BLADDER SURGERY     CATARACT EXTRACTION, BILATERAL     CHOLECYSTECTOMY     COLONOSCOPY     CORONARY ANGIOPLASTY WITH STENT PLACEMENT  03/20/2014   Procedure: CORONARY ANGIOPLASTY WITH STENT PLACEMENT; Location: [facility]; Surgeon: Willene Hatchet, MD   CORONARY ANGIOPLASTY WITH STENT PLACEMENT Left 01/25/2016   Procedure: CORONARY ANGIOPLASTY WITH STENT PLACEMENT; Location: [facility]; Surgeon: Willene Hatchet, MD   ENDARTERECTOMY FEMORAL Bilateral 08/04/2022   Procedure: BILATERAL FEMORAL ENDARTERECTOMY;  Surgeon: Maeola Harman, MD;  Location[facility] OR;  Service: Vascular;  Laterality: Bilateral;   FASCIECTOMY Bilateral 08/04/2022   Procedure: BILATERAL LOWER EXTREMITY FASCIECTOMIES;  Surgeon: Maeola Harman, MD;  Location: [facility] OR;  Service: Vascular;  Laterality: Bilateral;   FOOT SURGERY Right    GROIN DEBRIDEMENT Bilateral 08/14/2022   Procedure: GROIN DEBRIDEMENT;  Surgeon: Maeola Harman, MD;  Location: [facility] OR;  Service: Vascular;  Laterality: Bilateral;   GROIN DEBRIDEMENT Bilateral 09/16/2022   Procedure: GROIN DEBRIDEMENT POSSIBLE VAC PLACMENT;  Surgeon: Victorino Sparrow, MD;  Location: [facility] OR;  Service: Vascular;  Laterality: Bilateral;   HEMORRHOID SURGERY     INSERTION OF ILIAC STENT Bilateral 08/04/2022    Procedure: INSERTION OF BILATERAL ILIAC STENTS;  Surgeon: Maeola Harman, MD;  Location: [facility] OR;  Service: Vascular;  Laterality: Bilateral;   IRRIGATION AND DEBRIDEMENT HEMATOMA Left 07/08/2014   GROIN   LOWER EXTREMITY ANGIOGRAM Bilateral 08/04/2022   Procedure: LOWER EXTREMITY ANGIOGRAM;  Surgeon: Maeola Harman, MD;  Location: [facility] OR;  Service: Vascular;  Laterality: Bilateral;   LOWER EXTREMITY ANGIOGRAPHY Right 04/12/2021   Procedure: Lower Extremity Angiography;  Surgeon: Renford Dills, MD;  Location: [facility] INVASIVE CV LAB;  Service: Cardiovascular;  Laterality: Right;   LOWER EXTREMITY ANGIOGRAPHY Left 05/28/2021   Procedure: LOWER EXTREMITY ANGIOGRAPHY;  Surgeon: Levora Dredge  G, MD;  Location: [facility] INVASIVE CV LAB;  Service: Cardiovascular;  Laterality: Left;   LOWER EXTREMITY ANGIOGRAPHY Left 03/25/2022   Procedure: Lower Extremity Angiography;  Surgeon: Renford Dills, MD;  Location: [facility] INVASIVE CV LAB;  Service: Cardiovascular;  Laterality: Left;   PATCH ANGIOPLASTY  08/04/2022   Procedure: LEFT FEMORAL PATCH ANGIOPLASTY USING XENOSURE BIOLOGIC PATCH;  Surgeon: Maeola Harman, MD;  Location: [facility] OR;  Service: Vascular;;   PSEUDOANEURYSM REPAIR Left 06/30/2014   FEMORAL ARTERY   RIGHT/LEFT HEART CATH AND CORONARY ANGIOGRAPHY Bilateral 09/29/2016   Procedure: RIGHT/LEFT HEART CATH AND CORONARY ANGIOGRAPHY; Location [facility]; Surgeon: Willene Hatchet, MD   THROMBECTOMY FEMORAL ARTERY Bilateral 08/04/2022   Procedure: BILATERAL LOWER EXTREMITY THROMBECTOMY;  Surgeon: Maeola Harman, MD;  Location: [facility] OR;  Service: Vascular;  Laterality: Bilateral;   TUBAL LIGATION     VISCERAL ANGIOGRAPHY N/A 11/13/2020   Procedure: VISCERAL ANGIOGRAPHY;  Surgeon: Renford Dills, MD;  Location: [facility] INVASIVE CV LAB;  Service: Cardiovascular;  Laterality: N/A;     Social History:   reports that she has been smoking cigarettes. She has a 72.00  pack-year smoking history. She has never used smokeless tobacco. She reports that she does not currently use alcohol. She reports that she does not currently use drugs.   Family History:  Her family history includes Mental illness in her mother. There is no history of Breast cancer.   Allergies Allergies  Allergen Reactions   Iodinated Contrast Media Hives   Tramadol Hcl Other (See Comments)    SEIZURE   Sulfa Antibiotics Hives   Shellfish Allergy Nausea And Vomiting and Rash    Scallops specifically      Home Medications  Prior to Admission medications   Medication Sig Start Date End Date Taking? Authorizing Provider  acetaminophen (TYLENOL) 325 MG tablet Take 2 tablets (650 mg total) by mouth every 6 (six) hours as needed for mild pain (or Fever >/= 101). 09/29/22  Yes Leroy Sea, MD  albuterol (VENTOLIN HFA) 108 (90 Base) MCG/ACT inhaler Inhale 2 puffs into the lungs every 4 (four) hours as needed for wheezing or shortness of breath.   Yes [provider]  amLODipine (NORVASC) 10 MG tablet Take 1 tablet (10 mg total) by mouth daily. 09/29/22 09/29/23 Yes Leroy Sea, MD  atorvastatin (LIPITOR) 80 MG tablet Take 80 mg by mouth at bedtime.   Yes [provider]  carvedilol (COREG) 6.25 MG tablet Take 1 tablet (6.25 mg total) by mouth 2 (two) times daily with a meal. 09/22/22 01/13/23 Yes Zigmund Daniel., MD  cetirizine (ZYRTEC) 10 MG tablet Take 10 mg by mouth daily.   Yes [provider]  cholecalciferol (VITAMIN D3) 25 MCG (1000 UNIT) tablet Take 1,000 Units by mouth daily.   Yes [provider]  ciprofloxacin (CIPRO) 500 MG tablet Take 500 mg by mouth 2 (two) times daily.   Yes [provider]  clopidogrel (PLAVIX) 75 MG tablet Take 1 tablet (75 mg total) by mouth daily. 11/15/20  Yes Regalado, Belkys A, MD  diclofenac Sodium (VOLTAREN) 1 % GEL Apply 2 g topically 4 (four) times daily as needed. 10/31/22  Yes Enedina Finner, MD   escitalopram (LEXAPRO) 10 MG tablet Take 1 tablet (10 mg total) by mouth daily. 09/22/22 01/13/23 Yes Zigmund Daniel., MD  hydrALAZINE (APRESOLINE) 50 MG tablet Take 1 tablet (50 mg total) by mouth 3 (three) times daily. 09/29/22 01/13/23 Yes Leroy Sea, MD  insulin aspart (NOVOLOG) 100 UNIT/ML FlexPen Before each meal 3 times a day, 140-199 - 2 units, 200-250 - 4 units, 251-299 - 8 units,  300-349 - 10 units,  350 or above 12 units. I 09/29/22  Yes Leroy Sea, MD  insulin aspart (NOVOLOG) 100 UNIT/ML injection Inject 3 Units into the skin 3 (three) times daily with meals. Hold for NPO or consuming less than 50% of meals Patient taking differently: Inject 3 Units into the skin See admin instructions. Inject 3 units subcutaneously with meals. HOLD for NPO or consuming < 50% of meals. 09/22/22  Yes Zigmund Daniel., MD  insulin glargine (LANTUS) 100 UNIT/ML injection Inject 0.1 mLs (10 Units total) into the skin 2 (two) times daily. 09/29/22  Yes Leroy Sea, MD  levETIRAcetam (KEPPRA) 500 MG tablet Take 1 tablet (500 mg total) by mouth 2 (two) times daily. 10/06/22  Yes Marinda Elk, MD  mirtazapine (REMERON) 7.5 MG tablet Take 1 tablet (7.5 mg total) by mouth at bedtime. 09/22/22 01/13/23 Yes Zigmund Daniel., MD  Multiple Vitamins-Minerals (MULTIVITAMIN WITH MINERALS) tablet Take 1 tablet by mouth daily.   Yes [provider]  nicotine (NICODERM CQ - DOSED IN MG/24 HOURS) 14 mg/24hr patch Place 1 patch (14 mg total) onto the skin daily. 10/31/22  Yes Enedina Finner, MD  pantoprazole (PROTONIX) 40 MG tablet Take 1 tablet (40 mg total) by mouth daily. 09/22/22 01/13/23 Yes Zigmund Daniel., MD  polyethylene glycol (MIRALAX / GLYCOLAX) 17 g packet Take 17 g by mouth daily. Titrate as needed for constipation. Patient taking differently: Take 17 g by mouth daily. 09/22/22  Yes Zigmund Daniel., MD  senna-docusate (SENOKOT-S) 8.6-50 MG tablet Take 1 tablet by  mouth daily. 09/30/22  Yes Leroy Sea, MD  traZODone (DESYREL) 50 MG tablet Take 0.5 tablets (25 mg total) by mouth at bedtime as needed for sleep. 09/22/22 01/13/23 Yes Zigmund Daniel., MD          Critical Care Time devoted to patient care services described in this note is 75 minutes.   Critical care was necessary to treat /prevent imminent and life-threatening deterioration.   PATIENT WITH VERY POOR PROGNOSIS I ANTICIPATE PROLONGED ICU LOS  Patient is critically ill. Patient with Multiorgan failure and at high risk for cardiac arrest and death.    Lucie Leather, M.D.  [facility]  Medical Director [facility] Medical Director [facility] Cardio-Pulmonary Department

## 2023-01-13 NOTE — ED Provider Notes (Signed)
[facility] Provider Note    Event Date/Time   First MD Initiated Contact with Patient 01/13/23 0830     (approximate)   History   Weakness, confusion   HPI  Heather [name] is a 72 y.o. female who presents to the emergency department today because of concerns for weakness and confusion.  Unfortunately patient cannot give significant history.  Husband did appear at bedside and unfortunately is a poor historian.  He does however sound like the patient has been getting sick for at least a couple of weeks with increased weakness and confusion. It is unclear what changed today that made them call 911.     Physical Exam   Triage Vital Signs: ED Triage Vitals  Enc Vitals Group     BP 01/13/23 0842 (!) 147/94     Pulse Rate 01/13/23 0834 (!) 148     Resp 01/13/23 0834 16     Temp 01/13/23 0834 97.6 F (36.4 C)     Temp Source 01/13/23 0834 Rectal     SpO2 01/13/23 0834 100 %     Weight --      Height --      Head Circumference --      Peak Flow --      Pain Score 01/13/23 0834 0     Pain Loc --      Pain Edu? --      Excl. in GC? --     Most recent vital signs: Vitals:   01/13/23 0834 01/13/23 0842  BP:  (!) 147/94  Pulse: (!) 148   Resp: 16   Temp: 97.6 F (36.4 C)   SpO2: 100%    General: Awake, alert, not completely oriented. CV:  Tachycardia. No pulses dopplerable in the right lower extremity. Resp:  Normal effort. Lungs clear. Abd:  No distention. Non tender. Other:  Right leg cool, pale, pulseless.   ED Results / Procedures / Treatments   Labs (all labs ordered are listed, but only abnormal results are displayed) Labs Reviewed  CBC WITH DIFFERENTIAL/PLATELET - Abnormal; Notable for the following components:      Result Value   WBC 21.6 (*)    RBC 5.20 (*)    HCT 46.9 (*)    Neutro Abs 17.7 (*)    Monocytes Absolute 1.5 (*)    Basophils Absolute 0.2 (*)    Abs Immature Granulocytes 0.67 (*)    All other components within  normal limits  CULTURE, BLOOD (ROUTINE X 2)  CULTURE, BLOOD (ROUTINE X 2)  LACTIC ACID, PLASMA  LACTIC ACID, PLASMA  COMPREHENSIVE METABOLIC PANEL  APTT  PROTIME-INR  URINALYSIS, COMPLETE (UACMP) WITH MICROSCOPIC     EKG  I, Phineas Semen, attending physician, personally viewed and interpreted this EKG  EKG Time: 0825 Rate: 143 Rhythm: sinus tachycardia Axis: normal Intervals: qtc 560 QRS: narrow  ST changes: no st elevation Impression: abnormale kg   RADIOLOGY I independently interpreted and visualized the CT head. My interpretation: No bleed Radiology interpretation:  IMPRESSION:  1. No acute intracranial abnormality.  2. Sequela of moderate chronic microvascular ischemic change with  likely chronic infarct in the left basal ganglia.    I independently interpreted and visualized the CXR. My interpretation: No pneumonia Radiology interpretation:  IMPRESSION:  Increased interstitial markings bilaterally with small developing  opacity in the right lower lobe. Findings suspicious for  atypical/viral infection.     PROCEDURES:  Critical Care performed: Yes  CRITICAL CARE Performed by:  Phineas Semen   Total critical care time: 35 minutes  Critical care time was exclusive of separately billable procedures and treating other patients.  Critical care was necessary to treat or prevent imminent or life-threatening deterioration.  Critical care was time spent personally by me on the following activities: development of treatment plan with patient and/or surrogate as well as nursing, discussions with consultants, evaluation of patient's response to treatment, examination of patient, obtaining history from patient or surrogate, ordering and performing treatments and interventions, ordering and review of laboratory studies, ordering and review of radiographic studies, pulse oximetry and re-evaluation of patient's condition.   Procedures    MEDICATIONS ORDERED IN  ED: Medications  ceFEPIme (MAXIPIME) 2 g in sodium chloride 0.9 % 100 mL IVPB (has no administration in time range)  vancomycin (VANCOCIN) IVPB 1000 mg/200 mL premix (has no administration in time range)  lactated ringers bolus 1,000 mL (1,000 mLs Intravenous New Bag/Given 01/13/23 0848)     IMPRESSION / MDM / ASSESSMENT AND PLAN / ED COURSE  I reviewed the triage vital signs and the nursing notes.                              Differential diagnosis includes, but is not limited to, infection, anemia, intracranial process, arterial occlusion  Patient's presentation is most consistent with acute presentation with potential threat to life or bodily function.   The patient is on the cardiac monitor to evaluate for evidence of arrhythmia and/or significant heart rate changes.  Patient presented to the emergency department today because of concerns for weakness and confusion.  On exam patient was not oriented and unable to give any significant history.  Was quite tachycardic.  The right leg was also concerning for being cool pulseless and painful.  Patient does have vascular history.  Broad workup was initiated.  I did discuss with vascular who recommended heparin at this time.  CT head without concerning abnormality.  Chest x-ray is concerning for possible pneumonia.  Patient was started on broad-spectrum IV antibiotics.  Additionally patient was given IV fluids. Discussed with Dr. Huel Cote with the hospitalist service who will plan on admission.      FINAL CLINICAL IMPRESSION(S) / ED DIAGNOSES   Final diagnoses:  Diabetic ketoacidosis without coma associated with other specified diabetes mellitus (HCC)  Pneumonia due to infectious organism, unspecified laterality, unspecified part of lung      Note:  This document was prepared using Dragon voice recognition software and may include unintentional dictation errors.    Phineas Semen, MD 01/13/23 1320

## 2023-01-13 NOTE — Progress Notes (Signed)
Pt transported to CT and returned on the vent without incident. Pt remains on the vent and is tol well at this time.

## 2023-01-13 NOTE — ED Notes (Signed)
Report given to [facility], ICU RN.

## 2023-01-13 NOTE — Assessment & Plan Note (Signed)
-   Hold home antihypertensives in the setting of n.p.o. status

## 2023-01-13 NOTE — Procedures (Signed)
Endotracheal Intubation: Patient required placement of an artificial airway secondary to Respiratory Failure  Consent: Emergent.   Hand washing performed prior to starting the procedure.   Medications administered for sedation prior to procedure:  Etomidate 20 mg IV,  Rocuronium 50 mg IV, Fentanyl 50 mcg IV.    A time out procedure was called and correct patient, name, & ID confirmed. Needed supplies and equipment were assembled and checked to include ETT, 10 ml syringe, Glidescope, Mac and Miller blades, suction, oxygen and bag mask valve, end tidal CO2 monitor.   Patient was positioned to align the mouth and pharynx to facilitate visualization of the glottis.   Heart rate, SpO2 and blood pressure was continuously monitored during the procedure. Pre-oxygenation was conducted prior to intubation and endotracheal tube was placed through the vocal cords into the trachea.     The artificial airway was placed under direct visualization via glidescope route using a 8.0 ETT on the first attempt.  ETT was secured at 22 cm mark.  Placement was confirmed by auscuitation of lungs with good breath sounds bilaterally and no stomach sounds.  Condensation was noted on endotracheal tube.   Pulse ox 98%.  CO2 detector in place with appropriate color change.   Complications: None .   Operator: Donnalynn Wheeless/Nelson  Chest radiograph ordered and pending.   Comments: OGT placed via glidescope.  Lucie Leather, M.D.  [facility]  Medical Director [facility] Medical Director [facility] Cardio-Pulmonary Department

## 2023-01-13 NOTE — ED Notes (Signed)
Spoke with both daughters over the phone, daughters state that their stepdad takes care of her the best he can but that they are pretty certain he has some type of dementia and isn't able to answer medical questions for her and to please call them   Nelma Rothman [phone]  [place] [phone]

## 2023-01-13 NOTE — Assessment & Plan Note (Signed)
Chest x-ray with bilateral interstitial markings with small opacity in the right lower lobe, most concerning for aspiration given altered mental status.  Differential also includes viral versus typical bacterial etiologies.  - S/p vancomycin and cefepime - Transition to Rocephin and azithromycin.  No indication at this time for anaerobic coverage given lack of empyema or effusion - Continuous pulse oximetry - Legionella and strep pneumo antigens pending - COVID-19 PCR pending

## 2023-01-14 DIAGNOSIS — J189 Pneumonia, unspecified organism: Secondary | ICD-10-CM | POA: Diagnosis not present

## 2023-01-14 DIAGNOSIS — N179 Acute kidney failure, unspecified: Secondary | ICD-10-CM

## 2023-01-14 DIAGNOSIS — E0811 Diabetes mellitus due to underlying condition with ketoacidosis with coma: Secondary | ICD-10-CM | POA: Diagnosis not present

## 2023-01-14 DIAGNOSIS — Z7189 Other specified counseling: Secondary | ICD-10-CM | POA: Diagnosis not present

## 2023-01-14 DIAGNOSIS — I998 Other disorder of circulatory system: Secondary | ICD-10-CM | POA: Diagnosis not present

## 2023-01-14 LAB — BASIC METABOLIC PANEL
Anion gap: 9 (ref 5–15)
Anion gap: 6 (ref 5–15)
BUN: 13 mg/dL (ref 8–23)
BUN: 10 mg/dL (ref 8–23)
CO2: 29 mmol/L (ref 22–32)
CO2: 27 mmol/L (ref 22–32)
Calcium: 7.1 mg/dL — ABNORMAL LOW (ref 8.9–10.3)
Calcium: 6.6 mg/dL — ABNORMAL LOW (ref 8.9–10.3)
Chloride: 101 mmol/L (ref 98–111)
Chloride: 101 mmol/L (ref 98–111)
Creatinine, Ser: 0.5 mg/dL (ref 0.44–1.00)
Creatinine, Ser: 0.43 mg/dL — ABNORMAL LOW (ref 0.44–1.00)
GFR, Estimated: >60 mL/min (ref >60)
GFR, Estimated: >60 mL/min (ref >60)
Glucose, Bld: 247 mg/dL — ABNORMAL HIGH (ref 70–99)
Glucose, Bld: 70 mg/dL (ref 70–99)
Potassium: 2.7 mmol/L — CL (ref 3.5–5.1)
Potassium: 3.5 mmol/L (ref 3.5–5.1)
Sodium: 139 mmol/L (ref 135–145)
Sodium: 134 mmol/L — ABNORMAL LOW (ref 135–145)

## 2023-01-14 LAB — COMPREHENSIVE METABOLIC PANEL
ALT: 32 U/L (ref 0–44)
AST: 168 U/L — ABNORMAL HIGH (ref 15–41)
Albumin: 1.9 g/dL — ABNORMAL LOW (ref 3.5–5.0)
Alkaline Phosphatase: 75 U/L (ref 38–126)
Anion gap: 9 (ref 5–15)
BUN: 11 mg/dL (ref 8–23)
CO2: 28 mmol/L (ref 22–32)
Calcium: 6.8 mg/dL — ABNORMAL LOW (ref 8.9–10.3)
Chloride: 98 mmol/L (ref 98–111)
Creatinine, Ser: 0.42 mg/dL — ABNORMAL LOW (ref 0.44–1.00)
GFR, Estimated: >60 mL/min (ref >60)
Glucose, Bld: 150 mg/dL — ABNORMAL HIGH (ref 70–99)
Potassium: 3.9 mmol/L (ref 3.5–5.1)
Sodium: 135 mmol/L (ref 135–145)
Total Bilirubin: 0.9 mg/dL (ref 0.3–1.2)
Total Protein: 4.5 g/dL — ABNORMAL LOW (ref 6.5–8.1)

## 2023-01-14 LAB — GLUCOSE, CAPILLARY
Glucose-Capillary: 134 mg/dL — ABNORMAL HIGH (ref 70–99)
Glucose-Capillary: 144 mg/dL — ABNORMAL HIGH (ref 70–99)
Glucose-Capillary: 152 mg/dL — ABNORMAL HIGH (ref 70–99)
Glucose-Capillary: 162 mg/dL — ABNORMAL HIGH (ref 70–99)
Glucose-Capillary: 175 mg/dL — ABNORMAL HIGH (ref 70–99)
Glucose-Capillary: 201 mg/dL — ABNORMAL HIGH (ref 70–99)
Glucose-Capillary: 223 mg/dL — ABNORMAL HIGH (ref 70–99)
Glucose-Capillary: 226 mg/dL — ABNORMAL HIGH (ref 70–99)
Glucose-Capillary: 46 mg/dL — ABNORMAL LOW (ref 70–99)
Glucose-Capillary: 53 mg/dL — ABNORMAL LOW (ref 70–99)
Glucose-Capillary: 65 mg/dL — ABNORMAL LOW (ref 70–99)
Glucose-Capillary: 82 mg/dL (ref 70–99)

## 2023-01-14 LAB — PROTIME-INR
INR: 1.1 (ref 0.8–1.2)
Prothrombin Time: 14.4 seconds (ref 11.4–15.2)

## 2023-01-14 LAB — CBC
HCT: 34.7 % — ABNORMAL LOW (ref 36.0–46.0)
Hemoglobin: 11.9 g/dL — ABNORMAL LOW (ref 12.0–15.0)
MCH: 28.1 pg (ref 26.0–34.0)
MCHC: 34.3 g/dL (ref 30.0–36.0)
MCV: 81.8 fL (ref 80.0–100.0)
Platelets: 207 10*3/uL (ref 150–400)
RBC: 4.24 MIL/uL (ref 3.87–5.11)
RDW: 14.6 % (ref 11.5–15.5)
WBC: 13.4 10*3/uL — ABNORMAL HIGH (ref 4.0–10.5)
nRBC: 0 % (ref 0.0–0.2)

## 2023-01-14 LAB — BETA-HYDROXYBUTYRIC ACID
Beta-Hydroxybutyric Acid: 0.37 mmol/L — ABNORMAL HIGH (ref 0.05–0.27)
Beta-Hydroxybutyric Acid: 0.73 mmol/L — ABNORMAL HIGH (ref 0.05–0.27)
Beta-Hydroxybutyric Acid: >8 mmol/L — ABNORMAL HIGH (ref 0.05–0.27)

## 2023-01-14 LAB — HEPARIN LEVEL (UNFRACTIONATED): Heparin Unfractionated: 0.33 IU/mL (ref 0.30–0.70)

## 2023-01-14 LAB — STREP PNEUMONIAE URINARY ANTIGEN: Strep Pneumo Urinary Antigen: NEGATIVE

## 2023-01-14 LAB — LACTIC ACID, PLASMA: Lactic Acid, Venous: 2.9 mmol/L (ref 0.5–1.9)

## 2023-01-14 LAB — MRSA NEXT GEN BY PCR, NASAL: MRSA by PCR Next Gen: NOT DETECTED

## 2023-01-14 MED ORDER — LACTATED RINGERS IV BOLUS
1000.0000 mL | Freq: Once | INTRAVENOUS | Status: AC
  Administered 2023-01-14: 1000 mL via INTRAVENOUS

## 2023-01-14 MED ORDER — INSULIN ASPART 100 UNIT/ML IJ SOLN
0.0000 [IU] | INTRAMUSCULAR | Status: DC
  Administered 2023-01-14 (×2): 3 [IU] via SUBCUTANEOUS
  Administered 2023-01-14: 4 [IU] via SUBCUTANEOUS
  Filled 2023-01-14 (×5): qty 1

## 2023-01-14 MED ORDER — ORAL CARE MOUTH RINSE: 15.0000 mL | OROMUCOSAL | Status: DC | PRN

## 2023-01-14 MED ORDER — INSULIN GLARGINE-YFGN 100 UNIT/ML ~~LOC~~ SOLN
10.0000 [IU] | Freq: Once | SUBCUTANEOUS | Status: AC
  Administered 2023-01-14: 10 [IU] via SUBCUTANEOUS
  Filled 2023-01-14: qty 0.1

## 2023-01-14 MED ORDER — POTASSIUM CHLORIDE 2 MEQ/ML IV SOLN
INTRAVENOUS | Status: DC
  Filled 2023-01-14 (×7): qty 1000

## 2023-01-14 MED ORDER — LEVETIRACETAM IN NACL 500 MG/100ML IV SOLN
500.0000 mg | Freq: Two times a day (BID) | INTRAVENOUS | Status: DC
  Administered 2023-01-14 – 2023-01-17 (×8): 500 mg via INTRAVENOUS
  Filled 2023-01-14 (×9): qty 100

## 2023-01-14 MED ORDER — ORAL CARE MOUTH RINSE
15.0000 mL | OROMUCOSAL | Status: DC
  Administered 2023-01-14 – 2023-01-21 (×85): 15 mL via OROMUCOSAL

## 2023-01-14 MED ORDER — PANTOPRAZOLE SODIUM 40 MG IV SOLR
40.0000 mg | INTRAVENOUS | Status: DC
  Administered 2023-01-14 – 2023-01-18 (×5): 40 mg via INTRAVENOUS
  Filled 2023-01-14 (×5): qty 10

## 2023-01-14 MED ORDER — INSULIN GLARGINE-YFGN 100 UNIT/ML ~~LOC~~ SOLN
10.0000 [IU] | Freq: Every day | SUBCUTANEOUS | Status: DC
  Administered 2023-01-14: 10 [IU] via SUBCUTANEOUS
  Filled 2023-01-14 (×2): qty 0.1

## 2023-01-14 MED ORDER — POTASSIUM CHLORIDE 10 MEQ/100ML IV SOLN
10.0000 meq | INTRAVENOUS | Status: AC
  Administered 2023-01-14 (×4): 10 meq via INTRAVENOUS
  Filled 2023-01-14 (×4): qty 100

## 2023-01-14 MED ORDER — LEVALBUTEROL HCL 0.63 MG/3ML IN NEBU
0.6300 mg | INHALATION_SOLUTION | Freq: Four times a day (QID) | RESPIRATORY_TRACT | Status: DC
  Administered 2023-01-15 – 2023-01-18 (×14): 0.63 mg via RESPIRATORY_TRACT
  Filled 2023-01-14 (×14): qty 3

## 2023-01-14 NOTE — Progress Notes (Signed)
NAME:  Dynette Maginnis, MRN:  [id number], DOB:  [birth year], LOS: 1 ADMISSION DATE:  01/13/2023 CHIEF COMPLAINT:  resp distress   History of Present Illness:  72 y.o. female with medical history significant of HFpEF, CAD s/p DES to LAD (2015), rheumatic heart disease s/p TAVR, T2DM, HTN, HLD, PAD s/p multiple stents and left AKA, COPD, who presents to the ED with c/o altered mental status.    Patient with severe metabolic encephalopathy patient's husband, there has been a slow decline over the last few weeks, however patient became suddenly altered yesterday evening and began to speak without sense  her right lower extremity has become paler over the last several weeks and he has not seen her move it recently but moves it for her intermittently.   ED course: On arrival to the ED, patient was hypertensive at 129/92 with heart rate of 149.  She was saturating at 100% on room air.  She was afebrile at 97.6.  Initial workup with VBG demonstrating pH of 7.14, pCO2 less than 18, glucose of 605, bicarb less than 7, potassium 3.2, creatinine 1.07 with GFR 56, albumin 2.8, WBC 21.6, lactic acid 2.9, INR 1.3, and urinalysis with ketonuria.  CT of the head with no acute intracranial abnormalities.  Chest x-ray with concerns for pneumonia.  Patient started on IV antibiotics for pneumonia.  Patient started on IV insulin for DKA.  Vascular surgery consulted for ischemic limb; patient started on IV heparin.  TRH contacted for admission, PCCM asked to Consult  Multiple admission in the past Cyanosis from waist down Severe ABD pain Critical limb ischemia   Significant Hospital Events: Including procedures, antibiotic start and stop dates in addition to other pertinent events   5/21 ICU admission for acidosis and encephalopathy 5/22 remains on vent, remains critically ill     Micro Data:  CULTURES PENDING  Antimicrobials:   Antibiotics Given (last 72 hours)     Date/Time Action Medication Dose Rate    01/13/23 1014 New Bag/Given   ceFEPIme (MAXIPIME) 2 g in sodium chloride 0.9 % 100 mL IVPB 2 g 200 mL/hr   01/13/23 1101 New Bag/Given   vancomycin (VANCOCIN) IVPB 1000 mg/200 mL premix 1,000 mg 200 mL/hr   01/13/23 1414 New Bag/Given   azithromycin (ZITHROMAX) 500 mg in sodium chloride 0.9 % 250 mL IVPB 500 mg 250 mL/hr   01/13/23 1952 New Bag/Given   piperacillin-tazobactam (ZOSYN) IVPB 3.375 g 3.375 g 12.5 mL/hr   01/14/23 0645 New Bag/Given   piperacillin-tazobactam (ZOSYN) IVPB 3.375 g 3.375 g 12.5 mL/hr            Interim History / Subjective:  Remains Mottled from waist down Cyanosis of RT leg, Cyanosis of left Stump(s/p L AKA) Severe resp failure  Vent Mode: PRVC FiO2 (%):  [35 %-40 %] 35 % Set Rate:  [20 bmp] 20 bmp Vt Set:  [450 mL] 450 mL PEEP:  [5 cmH20] 5 cmH20      Objective   Blood pressure 108/73, pulse (!) 142, temperature 99.5 F (37.5 C), resp. rate (!) 21, height 5' (1.524 m), weight 45.6 kg, SpO2 97 %.    Vent Mode: PRVC FiO2 (%):  [35 %-40 %] 35 % Set Rate:  [20 bmp] 20 bmp Vt Set:  [450 mL] 450 mL PEEP:  [5 cmH20] 5 cmH20   Intake/Output Summary (Last 24 hours) at 01/14/2023 0742 Last data filed at 01/14/2023 0600 Gross per 24 hour  Intake 6103.69 ml  Output 2700 ml  Net 3403.69 ml   Filed Weights   01/13/23 0900 01/13/23 0926  Weight: 45.6 kg 45.6 kg      REVIEW OF SYSTEMS  PATIENT IS UNABLE TO PROVIDE COMPLETE REVIEW OF SYSTEMS DUE TO SEVERE CRITICAL ILLNESS   PHYSICAL EXAMINATION:  GENERAL:critically ill appearing, +resp distress EYES: Pupils equal, round, reactive to light.  No scleral icterus.  MOUTH: Moist mucosal membrane. INTUBATED NECK: Supple.  PULMONARY: Lungs clear to auscultation, +rhonchi, +wheezing CARDIOVASCULAR: S1 and S2.  Regular rate and rhythm GASTROINTESTINAL: Soft, nontender, -distended.NEG bowel sounds.  MUSCULOSKELETAL:+cyanosis, mottled skin, left AKA NEUROLOGIC: obtunded,sedated SKIN:abnormal,  cold to touch, Capillary refill delayed  Pulses present bilaterally   Labs/imaging that I havepersonally reviewed  (right click and "Reselect all SmartList Selections" daily)      ASSESSMENT AND PLAN SYNOPSIS  72 yo white female with multiple medical issues with severe metabolic acidosis due to severe DKA with severe metabolic encephalopathy with acute cyanosis of lower half of her body mottled skin with critical limb ischemia active smoker, possible aspiration pneumonia  Severe ACUTE Hypoxic and Hypercapnic Respiratory Failure -continue Mechanical Ventilator support -Wean Fio2 and PEEP as tolerated -VAP/VENT bundle implementation - Wean PEEP & FiO2 as tolerated, maintain SpO2 > 88% - Head of bed elevated 30 degrees, VAP protocol in place - Plateau pressures less than 30 cm H20  - Intermittent chest x-ray & ABG PRN - Ensure adequate pulmonary hygiene  UNABLE TO WEAN FROM VENT   HIGH RISK FOR CARDIAC ARREST  CARDIAC ICU monitoring   RENAL-SEVERE ACIDOSIS CONTINUE BICARB INFUSION -Avoid nephrotoxic agents -Follow urine output, BMP -Ensure adequate renal perfusion, optimize oxygenation -Renal dose medications   Intake/Output Summary (Last 24 hours) at 01/14/2023 0742 Last data filed at 01/14/2023 0600 Gross per 24 hour  Intake 6103.69 ml  Output 2700 ml  Net 3403.69 ml     NEUROLOGY ACUTE METABOLIC ENCEPHALOPATHY -need for sedation -Goal RASS -2 to -3  SEPTIC shock SOURCE-LIMB ISCHEMIA, PNEUMONIA -use vasopressors to keep MAP>65 as needed -follow ABG and LA as needed -follow up cultures -emperic ABX -aggressive IV fluid Resuscitation   INFECTIOUS DISEASE -continue antibiotics as prescribed -follow up cultures   ENDO - ICU hypoglycemic\Hyperglycemia protocol -check FSBS per protocol   GI GI PROPHYLAXIS as indicated  NUTRITIONAL STATUS DIET-->TF's as tolerated Constipation protocol as indicated   ELECTROLYTES -follow labs as needed -replace  as needed -pharmacy consultation and following   ACUTE ANEMIA- TRANSFUSE AS NEEDED CONSIDER TRANSFUSION  IF HGB<7      Best practice (right click and "Reselect all SmartList Selections" daily)  Diet: NPO Central venous access:  N/A Arterial line:  N/A Foley:  N/A Mobility:  bed rest  Code Status:  FULL Disposition:ICU  Labs   CBC: Recent Labs  Lab 01/13/23 0828 01/14/23 0554  WBC 21.6* 13.4*  NEUTROABS 17.7*  --   HGB 14.4 11.9*  HCT 46.9* 34.7*  MCV 90.2 81.8  PLT 337 207     Basic Metabolic Panel: Recent Labs  Lab 01/13/23 1359 01/13/23 1603 01/13/23 2130 01/14/23 0027 01/14/23 0554  NA 138 137 138 139 135  K 4.3 5.0 3.0* 2.7* 3.9  CL 113* 113* 100 101 98  CO2 <7* <7* 26 29 28   GLUCOSE 377* 268* 265* 247* 150*  BUN 19 20 15 13 11   CREATININE 0.86 0.71 0.55 0.50 0.42*  CALCIUM 7.8* 7.7* 7.0* 7.1* 6.8*  MG 1.5*  --   --   --   --   PHOS 3.3  --   --   --   --  GFR: Estimated Creatinine Clearance: 47.7 mL/min (A) (by C-G formula based on SCr of 0.42 mg/dL (L)). Recent Labs  Lab 01/13/23 0828 01/13/23 0955 01/14/23 0027 01/14/23 0554  WBC 21.6*  --   --  13.4*  LATICACIDVEN 2.9* 2.5* 2.9*  --      Liver Function Tests: Recent Labs  Lab 01/13/23 0828 01/14/23 0554  AST 23 168*  ALT 18 32  ALKPHOS 104 75  BILITOT 2.3* 0.9  PROT 6.3* 4.5*  ALBUMIN 2.8* 1.9*    No results for input(s): "LIPASE", "AMYLASE" in the last 168 hours. No results for input(s): "AMMONIA" in the last 168 hours.  ABG    Component Value Date/Time   PHART 7.5 (H) 01/13/2023 2029   PCO2ART 35 01/13/2023 2029   PO2ART 127 (H) 01/13/2023 2029   HCO3 27.3 01/13/2023 2029   TCO2 22 08/08/2022 0434   ACIDBASEDEF 23.3 (H) 01/13/2023 0955   O2SAT 99.2 01/13/2023 2029     Coagulation Profile: Recent Labs  Lab 01/13/23 0828 01/14/23 0554  INR 1.3* 1.1     Cardiac Enzymes: No results for input(s): "CKTOTAL", "CKMB", "CKMBINDEX", "TROPONINI" in the last  168 hours.  HbA1C: Hgb A1c MFr Bld  Date/Time Value Ref Range Status  08/05/2022 04:00 AM 12.9 (H) 4.8 - 5.6 % Final    Comment:    (NOTE)         Prediabetes: 5.7 - 6.4         Diabetes: >6.4         Glycemic control for adults with diabetes: <7.0   07/02/2022 12:11 PM 12.0 (H) 4.8 - 5.6 % Final    Comment:    (NOTE) Pre diabetes:          5.7%-6.4%  Diabetes:              >6.4%  Glycemic control for   <7.0% adults with diabetes     CBG: Recent Labs  Lab 01/13/23 2341 01/14/23 0048 01/14/23 0203 01/14/23 0259 01/14/23 0356  GLUCAP 226* 201* 162* 152* 175*      Past Medical History:  She,  has a past medical history of (HFpEF) heart failure with preserved ejection fraction (HCC), Angina pectoris (HCC), Anxiety, Aortic atherosclerosis (HCC), Basal cell carcinoma, Bilateral carotid artery disease (HCC), Bipolar disorder (HCC), Cervicalgia, Chronic mesenteric ischemia (HCC), COPD (chronic obstructive pulmonary disease) (HCC), Coronary artery disease (03/20/2014), DDD (degenerative disc disease), cervical, Depression, Diabetic polyneuropathy (HCC), GERD (gastroesophageal reflux disease), Hyperlipidemia, Hypertension, Long term current use of antithrombotics/antiplatelets, Memory loss, NSTEMI (non-ST elevated myocardial infarction) (HCC) (07/2016), PAD (peripheral artery disease) (HCC), Respiratory failure with hypoxia (HCC), 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   Surgical History:   Past Surgical History:  Procedure Laterality Date   AMPUTATION Left 08/14/2022   Procedure: ABOVE KNEE AMPUTATION;  Surgeon: Maeola Harman, MD;  Location[facility];  Service: Vascular;  Laterality: Left;   AORTIC VALVE REPLACEMENT  09/30/2016   Procedure: TRANSCATHETER AORTIC VALVE REPAIR   APPLICATION OF WOUND VAC Bilateral 08/14/2022   Procedure:  APPLICATION OF WOUND VAC;  Surgeon: Maeola Harman, MD;  Location: [facility] OR;  Service: Vascular;  Laterality: Bilateral;   BLADDER SURGERY     CATARACT EXTRACTION, BILATERAL     CHOLECYSTECTOMY     COLONOSCOPY     CORONARY ANGIOPLASTY WITH STENT PLACEMENT  03/20/2014   Procedure: CORONARY ANGIOPLASTY WITH STENT PLACEMENT; Location:  [facility]; Surgeon: Willene Hatchet, MD   CORONARY ANGIOPLASTY WITH STENT PLACEMENT Left 01/25/2016   Procedure: CORONARY ANGIOPLASTY WITH STENT PLACEMENT; Location: [facility]; Surgeon: Willene Hatchet, MD   ENDARTERECTOMY FEMORAL Bilateral 08/04/2022   Procedure: BILATERAL FEMORAL ENDARTERECTOMY;  Surgeon: Maeola Harman, MD;  Location: [facility] OR;  Service: Vascular;  Laterality: Bilateral;   FASCIECTOMY Bilateral 08/04/2022   Procedure: BILATERAL LOWER EXTREMITY FASCIECTOMIES;  Surgeon: Maeola Harman, MD;  Location: [facility] OR;  Service: Vascular;  Laterality: Bilateral;   FOOT SURGERY Right    GROIN DEBRIDEMENT Bilateral 08/14/2022   Procedure: GROIN DEBRIDEMENT;  Surgeon: Maeola Harman, MD;  Location: [facility] OR;  Service: Vascular;  Laterality: Bilateral;   GROIN DEBRIDEMENT Bilateral 09/16/2022   Procedure: GROIN DEBRIDEMENT POSSIBLE VAC PLACMENT;  Surgeon: Victorino Sparrow, MD;  Location: [facility] OR;  Service: Vascular;  Laterality: Bilateral;   HEMORRHOID SURGERY     INSERTION OF ILIAC STENT Bilateral 08/04/2022   Procedure: INSERTION OF BILATERAL ILIAC STENTS;  Surgeon: Maeola Harman, MD;  Location: [facility] OR;  Service: Vascular;  Laterality: Bilateral;   IRRIGATION AND DEBRIDEMENT HEMATOMA Left 07/08/2014   GROIN   LOWER EXTREMITY ANGIOGRAM Bilateral 08/04/2022   Procedure: LOWER EXTREMITY ANGIOGRAM;  Surgeon: Maeola Harman, MD;  Location: [facility] OR;  Service: Vascular;  Laterality: Bilateral;   LOWER EXTREMITY ANGIOGRAPHY Right 04/12/2021   Procedure: Lower Extremity Angiography;  Surgeon: Renford Dills, MD;  Location:  [facility] INVASIVE CV LAB;  Service: Cardiovascular;  Laterality: Right;   LOWER EXTREMITY ANGIOGRAPHY Left 05/28/2021   Procedure: LOWER EXTREMITY ANGIOGRAPHY;  Surgeon: Renford Dills, MD;  Location: [facility] INVASIVE CV LAB;  Service: Cardiovascular;  Laterality: Left;   LOWER EXTREMITY ANGIOGRAPHY Left 03/25/2022   Procedure: Lower Extremity Angiography;  Surgeon: Renford Dills, MD;  Location: [facility] INVASIVE CV LAB;  Service: Cardiovascular;  Laterality: Left;   PATCH ANGIOPLASTY  08/04/2022   Procedure: LEFT FEMORAL PATCH ANGIOPLASTY USING XENOSURE BIOLOGIC PATCH;  Surgeon: Maeola Harman, MD;  Location: [facility] OR;  Service: Vascular;;   PSEUDOANEURYSM REPAIR Left 06/30/2014   FEMORAL ARTERY   RIGHT/LEFT HEART CATH AND CORONARY ANGIOGRAPHY Bilateral 09/29/2016   Procedure: RIGHT/LEFT HEART CATH AND CORONARY ANGIOGRAPHY; Location [facility]; Surgeon: Willene Hatchet, MD   THROMBECTOMY FEMORAL ARTERY Bilateral 08/04/2022   Procedure: BILATERAL LOWER EXTREMITY THROMBECTOMY;  Surgeon: Maeola Harman, MD;  Location: [facility] OR;  Service: Vascular;  Laterality: Bilateral;   TUBAL LIGATION     VISCERAL ANGIOGRAPHY N/A 11/13/2020   Procedure: VISCERAL ANGIOGRAPHY;  Surgeon: Renford Dills, MD;  Location: [facility] INVASIVE CV LAB;  Service: Cardiovascular;  Laterality: N/A;     Social History:   reports that she has been smoking cigarettes. She has a 72.00 pack-year smoking history. She has never used smokeless tobacco. She reports that she does not currently use alcohol. She reports that she does not currently use drugs.   Family History:  Her family history includes Mental illness in her mother. There is no history of Breast cancer.   Allergies Allergies  Allergen Reactions   Iodinated Contrast Media Hives   Tramadol Hcl Other (See Comments)    SEIZURE   Sulfa Antibiotics Hives   Shellfish Allergy Nausea And Vomiting and Rash    Scallops specifically      Home Medications   Prior to Admission medications   Medication Sig Start Date End Date Taking? Authorizing Provider  acetaminophen (TYLENOL) 325 MG tablet Take 2 tablets (650 mg total) by mouth every 6 (six)  hours as needed for mild pain (or Fever >/= 101). 09/29/22  Yes Leroy Sea, MD  albuterol (VENTOLIN HFA) 108 (90 Base) MCG/ACT inhaler Inhale 2 puffs into the lungs every 4 (four) hours as needed for wheezing or shortness of breath.   Yes [provider]  amLODipine (NORVASC) 10 MG tablet Take 1 tablet (10 mg total) by mouth daily. 09/29/22 09/29/23 Yes Leroy Sea, MD  atorvastatin (LIPITOR) 80 MG tablet Take 80 mg by mouth at bedtime.   Yes [provider]  carvedilol (COREG) 6.25 MG tablet Take 1 tablet (6.25 mg total) by mouth 2 (two) times daily with a meal. 09/22/22 01/13/23 Yes Zigmund Daniel., MD  cetirizine (ZYRTEC) 10 MG tablet Take 10 mg by mouth daily.   Yes [provider]  cholecalciferol (VITAMIN D3) 25 MCG (1000 UNIT) tablet Take 1,000 Units by mouth daily.   Yes [provider]  ciprofloxacin (CIPRO) 500 MG tablet Take 500 mg by mouth 2 (two) times daily.   Yes [provider]  clopidogrel (PLAVIX) 75 MG tablet Take 1 tablet (75 mg total) by mouth daily. 11/15/20  Yes Regalado, Belkys A, MD  diclofenac Sodium (VOLTAREN) 1 % GEL Apply 2 g topically 4 (four) times daily as needed. 10/31/22  Yes Enedina Finner, MD  escitalopram (LEXAPRO) 10 MG tablet Take 1 tablet (10 mg total) by mouth daily. 09/22/22 01/13/23 Yes Zigmund Daniel., MD  hydrALAZINE (APRESOLINE) 50 MG tablet Take 1 tablet (50 mg total) by mouth 3 (three) times daily. 09/29/22 01/13/23 Yes Leroy Sea, MD  insulin aspart (NOVOLOG) 100 UNIT/ML FlexPen Before each meal 3 times a day, 140-199 - 2 units, 200-250 - 4 units, 251-299 - 8 units,  300-349 - 10 units,  350 or above 12 units. I 09/29/22  Yes Leroy Sea, MD  insulin aspart (NOVOLOG) 100 UNIT/ML injection Inject 3  Units into the skin 3 (three) times daily with meals. Hold for NPO or consuming less than 50% of meals Patient taking differently: Inject 3 Units into the skin See admin instructions. Inject 3 units subcutaneously with meals. HOLD for NPO or consuming < 50% of meals. 09/22/22  Yes Zigmund Daniel., MD  insulin glargine (LANTUS) 100 UNIT/ML injection Inject 0.1 mLs (10 Units total) into the skin 2 (two) times daily. 09/29/22  Yes Leroy Sea, MD  levETIRAcetam (KEPPRA) 500 MG tablet Take 1 tablet (500 mg total) by mouth 2 (two) times daily. 10/06/22  Yes Marinda Elk, MD  mirtazapine (REMERON) 7.5 MG tablet Take 1 tablet (7.5 mg total) by mouth at bedtime. 09/22/22 01/13/23 Yes Zigmund Daniel., MD  Multiple Vitamins-Minerals (MULTIVITAMIN WITH MINERALS) tablet Take 1 tablet by mouth daily.   Yes [provider]  nicotine (NICODERM CQ - DOSED IN MG/24 HOURS) 14 mg/24hr patch Place 1 patch (14 mg total) onto the skin daily. 10/31/22  Yes Enedina Finner, MD  pantoprazole (PROTONIX) 40 MG tablet Take 1 tablet (40 mg total) by mouth daily. 09/22/22 01/13/23 Yes Zigmund Daniel., MD  polyethylene glycol (MIRALAX / GLYCOLAX) 17 g packet Take 17 g by mouth daily. Titrate as needed for constipation. Patient taking differently: Take 17 g by mouth daily. 09/22/22  Yes Zigmund Daniel., MD  senna-docusate (SENOKOT-S) 8.6-50 MG tablet Take 1 tablet by mouth daily. 09/30/22  Yes Leroy Sea, MD  traZODone (DESYREL) 50 MG tablet Take 0.5 tablets (25 mg total) by mouth at  bedtime as needed for sleep. 09/22/22 01/13/23 Yes Zigmund Daniel., MD          PATIENT WITH VERY POOR PROGNOSIS PATIENT IN THE DYING PROCESS I ANTICIPATE PROLONGED ICU LOS     DVT/GI PRX  assessed I Assessed the need for Labs I Assessed the need for Foley I Assessed the need for Central Venous Line Family Discussion when available I Assessed the need for Mobilization I made an Assessment of  medications to be adjusted accordingly Safety Risk assessment completed  CASE DISCUSSED IN MULTIDISCIPLINARY ROUNDS WITH ICU TEAM     Critical Care Time devoted to patient care services described in this note is 65 minutes.  Critical care was necessary to treat /prevent imminent and life-threatening deterioration. Overall, patient is critically ill, prognosis is guarded.  Patient with Multiorgan failure and at high risk for cardiac arrest and death.    Lucie Leather, M.D.  [facility]  Medical Director [facility] Medical Director [facility] Cardio-Pulmonary Department

## 2023-01-14 NOTE — Progress Notes (Signed)
The patient is in critical condition and is continuing to deteriorate.  She is certainly in multisystem organ failure and has now been intubated.  Her mental status is also diminished and even more difficult to assess given her intubation.  I have reviewed the CT scan which essentially shows occlusion from the level of the aorta down.  Bilateral iliac common femoral superficial femoral and popliteal arteries are all occluded.  Even under better circumstances this is a nonreconstructable situation and I do not have any revascularization option to offer.  Under the circumstances comfort care would be the most appropriate course of action.

## 2023-01-14 NOTE — Consult Note (Signed)
ANTICOAGULATION CONSULT NOTE  Pharmacy Consult for Heparin Infusion Indication: arterial clot  Patient Measurements: Height: 5' (152.4 cm) Weight: 45.6 kg (100 lb 8.5 oz) IBW/kg (Calculated) : 45.5 Heparin Dosing Weight: 45.6 kg  Labs: Recent Labs    01/13/23 0828 01/13/23 1001 01/13/23 1359 01/13/23 1603 01/13/23 2130 01/14/23 0027 01/14/23 0321  HGB 14.4  --   --   --   --   --   --   HCT 46.9*  --   --   --   --   --   --   PLT 337  --   --   --   --   --   --   APTT 27  --   --   --   --   --   --   LABPROT 16.1*  --   --   --   --   --   --   INR 1.3*  --   --   --   --   --   --   HEPARINUNFRC  --    < > >1.10*  --  0.54  --  0.33  CREATININE 1.07*  --  0.86 0.71 0.55 0.50  --   TROPONINIHS  --   --  1,356* 2,875*  --   --   --    < > = values in this interval not displayed.    Estimated Creatinine Clearance: 47.7 mL/min (by C-G formula based on SCr of 0.5 mg/dL).  Medical History: Past Medical History:  Diagnosis Date   (HFpEF) heart failure with preserved ejection fraction (HCC)    Angina pectoris (HCC)    Anxiety    Aortic atherosclerosis (HCC)    Basal cell carcinoma    Bilateral carotid artery disease (HCC)    a.) carotid doppler 03/18/2021: 1-39% RICA, 40-59% LICA   Bipolar disorder (HCC)    Cervicalgia    Chronic mesenteric ischemia (HCC)    a.) s/p PTA 11/13/2020 --> 6 x 16 mm lifestream ballon expanding stent to SMA   COPD (chronic obstructive pulmonary disease) (HCC)    Coronary artery disease 03/20/2014   a.) LHC/PCI 03/20/2014: 70% mLAD (2.5 x 28 mm Xience Alpine DES), 70% dLCx, 70% dRCA; b.) LHC 01/25/2016: 90% ISR mLAD --> mLAD dissection with in stent --> 2.75 x 22 mm Resolute DES; c.) R/LHC 09/29/2016: 50% ISR mLAD, 70% dLAD, mPA 37, PCWP 24, AO sat 90%, PA sat 46%, CO 3.7, CI 2.46 - med mgmt   DDD (degenerative disc disease), cervical    Depression    Diabetic polyneuropathy (HCC)    GERD (gastroesophageal reflux disease)     Hyperlipidemia    Hypertension    Long term current use of antithrombotics/antiplatelets    a.) on DAPT (ASA + clopidogrel)   Memory loss    NSTEMI (non-ST elevated myocardial infarction) (HCC) 07/2016   a.) troponins trended (normal < 0.034 ng/mL): 0.325 --> 1.480 --> 2.330 --> 1.660 --> 0.169 ng/mL   PAD (peripheral artery disease) (HCC)    a.) s/p PTA and stenting SMA 11/13/2020; b.) s/p PTA and stenting of RIGHT SFA and popliteal 04/12/2021; c.) s/p PTA and kissing balloon stents to BILATERAL CIAs 05/28/2021   Respiratory failure with hypoxia (HCC)    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  Assessment: 72 yo female presented by EMS to the ED due to weakness and confusion.  Pharmacy consulted to initiate heparin infusion for arterial clot.   0521 1359 HL > 1.1, suprathera; 750 un/hr Heparin level was drawn 2.5 hours early 0521 2130 HL = 0.54, therapeutic X 1  0522 0321 HL = 0.33, therapeutic X 2   Goal of Therapy:  Heparin level 0.3-0.7 units/ml Monitor platelets by anticoagulation protocol: Yes   Plan:  5/22:   HL @ 0321 = 0.33, therapeutic X 2  - Will continue pt on current rate and recheck HL on 5/23 @ 0500. ----Daily CBC per protocol while on IV heparin  Adekunle Rohrbach D 01/14/2023,3:50 AM

## 2023-01-14 NOTE — Progress Notes (Signed)
eLink Physician-Brief Progress Note Patient Name: Heather [name] DOB: [birth year] MRN: [id number]   Date of Service  01/14/2023  HPI/Events of Note  Patient admitted with DKA, pneumonia, and altered mental status. Patient intubated for acute respiratory failure.  eICU Interventions  New Patient Evaluation.        Thomasene Lot [name] 01/14/2023, 12:09 AM

## 2023-01-14 NOTE — IPAL (Addendum)
  Interdisciplinary Goals of Care Family Meeting   Date carried out: 01/14/2023  Location of the meeting: Bedside  Member's involved: Physician, Bedside Registered Nurse, and Family Member or next of kin   GOALS OF CARE DISCUSSION  The Clinical status was relayed to family in detail-Daughter At Bedside  Updated and notified of patients medical condition- Patient remains unresponsive and will not open eyes to command.   Patient is having a weak cough and struggling to remove secretions.   Patient with increased WOB and using accessory muscles to breathe Explained to family course of therapy and the modalities   Patient with Progressive multiorgan failure with a very high probablity of a very minimal chance of meaningful recovery despite all aggressive and optimal medical therapy.   Sacral Wound POA Family understands the situation.  They have consented and agreed to DNR status  Family are satisfied with Plan of action and management. All questions answered  Additional CC time 35 mins   Alma Muegge Santiago Glad, M.D.  [facility]  Medical Director [facility] Medical Director [name] Cardio-Pulmonary Department

## 2023-01-14 NOTE — Progress Notes (Signed)
Pts HR is SinusTach 140 sustained after maintaining HR<130s mostly since coming up to ICU.  Chart reviewed showed HR 140s in ED.  Anna Genre, NP notified

## 2023-01-14 NOTE — Progress Notes (Signed)
Pt transported to ICU on the vent without incident. Pt remains on the vent and is tol well at this time. Report given to ICU RT. 

## 2023-01-14 NOTE — Consult Note (Addendum)
Consultation Note Date: 01/14/2023   Patient Name: Heather [name]  DOB: 02/03/1951  MRN: [id number]  Age / Sex: 72 y.o., female  PCP: Toy Cookey, FNP Referring Physician: Erin Fulling, MD  Reason for Consultation: Establishing goals of care  HPI/Patient Profile: 72 y.o. female with medical history significant of HFpEF, CAD s/p DES to LAD (2015), rheumatic heart disease s/p TAVR, T2DM, HTN, HLD, PAD s/p multiple stents and left AKA, COPD, who presents to the ED with c/o altered mental status.    Patient with severe metabolic encephalopathy patient's husband, there has been a slow decline over the last few weeks, however patient became suddenly altered yesterday evening and began to speak without sense   her right lower extremity has become paler over the last several weeks and he has not seen her move it recently but moves it for her intermittently.  Clinical Assessment and Goals of Care: Notes and labs reviewed. In to bedside. Patient is resting in bed on ventilator. Daughter at bedside, other daughter on speaker phone. Daughter states patient has been married x4. They state her husband has dementia which has worsened significantly recently. They state she has physical issues with mobility. Daughter discusses that the two live alone together, and they state "he is her body, and she is his brain".    We discussed care moving forward. The daughters discussed that they have been kept updated, and understand she will die. They state as they are currently waiting for "multi organ failure" and they will then one way extubate.      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 The following aspects are pertinent.  Past Medical History:  Diagnosis Date   (HFpEF) heart failure with preserved ejection fraction (HCC)    Angina pectoris (HCC)    Anxiety    Aortic atherosclerosis (HCC)    Basal cell carcinoma    Bilateral carotid artery disease (HCC)    a.) carotid doppler 03/18/2021: 1-39% RICA, 40-59% LICA   Bipolar disorder (HCC)    Cervicalgia    Chronic mesenteric ischemia (HCC)    a.) s/p PTA 11/13/2020 --> 6 x 16 mm lifestream ballon expanding stent to SMA   COPD (chronic obstructive pulmonary disease) (HCC)    Coronary artery disease 03/20/2014   a.) LHC/PCI 03/20/2014: 70% mLAD (2.5 x 28 mm Xience Alpine DES), 70% dLCx, 70% dRCA; b.) LHC 01/25/2016: 90% ISR mLAD --> mLAD dissection with in stent --> 2.75 x 22 mm Resolute DES; c.) R/LHC 09/29/2016: 50% ISR mLAD, 70% dLAD, mPA 37, PCWP 24, AO sat 90%, PA  sat 46%, CO 3.7, CI 2.46 - med mgmt   DDD (degenerative disc disease), cervical    Depression    Diabetic polyneuropathy (HCC)    GERD (gastroesophageal reflux disease)    Hyperlipidemia    Hypertension    Long term current use of antithrombotics/antiplatelets    a.) on DAPT (ASA + clopidogrel)   Memory loss    NSTEMI (non-ST elevated myocardial infarction) (HCC) 07/2016   a.) troponins trended (normal < 0.034 ng/mL): 0.325 --> 1.480 --> 2.330 --> 1.660 --> 0.169 ng/mL   PAD (peripheral artery disease) (HCC)    a.) s/p PTA and stenting SMA 11/13/2020; b.) s/p PTA and stenting of RIGHT SFA and popliteal 04/12/2021; c.) s/p PTA and kissing balloon stents to BILATERAL CIAs 05/28/2021   Respiratory failure with hypoxia (HCC)    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   a.)  23 mm Sapien 3 via suprasternal approach   Schizophrenia (HCC)    Severe aortic stenosis    a.) s/p TAVR 09/30/2016; 23 mm Sapien 3 via a suprasternal approach   Type 2 diabetes mellitus treated with insulin (HCC)    Vertigo    Social History   Socioeconomic History   Marital status: Married    Spouse name: [name]   Number of children: 2   Years of education: Not on file   Highest education level: High school graduate  Occupational History   Not on file  Tobacco Use   Smoking status: Every Day    Packs/day: 1.50    Years: 48.00    Additional pack years: 0.00    Total pack years: 72.00    Types: Cigarettes   Smokeless tobacco: Never  Vaping Use   Vaping Use: Never used  Substance and Sexual Activity   Alcohol use: Not Currently   Drug use: Not Currently   Sexual activity: Not Currently  Other Topics Concern   Not on file  Social History Narrative   Lives at home with [employer]   Social Determinants of Health   Financial Resource Strain: Medium Risk (10/19/2018)   Overall Financial Resource Strain (CARDIA)    Difficulty of Paying Living Expenses: Somewhat hard  Food Insecurity: No Food Insecurity (10/18/2022)   Hunger Vital Sign    Worried About Running Out of Food in the Last Year: Never true    Ran Out of Food in the Last Year: Never true  Recent Concern: Food Insecurity - Food Insecurity Present (08/11/2022)   Hunger Vital Sign    Worried About Running Out of Food in the Last Year: Sometimes true    Ran Out of Food in the Last Year: Sometimes true  Transportation Needs: No Transportation Needs (10/18/2022)   PRAPARE - Administrator, Civil Service (Medical): No    Lack of Transportation (Non-Medical): No  Physical Activity: Inactive (10/19/2018)   Exercise Vital Sign    Days of Exercise per Week: 0 days    Minutes of Exercise per Session: 0 min  Stress: Not on file  Social Connections: Unknown (10/19/2018)   Social Connection and Isolation Panel [NHANES]     Frequency of Communication with Friends and Family: Not on file    Frequency of Social Gatherings with Friends and Family: Not on file    Attends Religious Services: More than 4 times per year    Active Member of [employer] or Organizations: No    Attends Banker Meetings: Never    Marital Status: Married  Family History  Problem Relation Age of Onset   Mental illness Mother    Breast cancer Neg Hx    Scheduled Meds:  Chlorhexidine Gluconate Cloth  6 each Topical Daily   docusate  100 mg Per Tube BID   folic acid  1 mg Intravenous Daily   insulin aspart  0-20 Units Subcutaneous Q4H   insulin glargine-yfgn  10 Units Subcutaneous Daily   ipratropium-albuterol  3 mL Nebulization Q6H   mouth rinse  15 mL Mouth Rinse Q2H   pantoprazole (PROTONIX) IV  40 mg Intravenous Q24H   polyethylene glycol  17 g Per Tube Daily   sodium chloride flush  3 mL Intravenous Q12H   thiamine (VITAMIN B1) injection  100 mg Intravenous Daily   Continuous Infusions:  fentaNYL infusion INTRAVENOUS 100 mcg/hr (01/14/23 1300)   heparin 550 Units/hr (01/14/23 1300)   lactated ringers 1,000 mL with potassium chloride 20 mEq infusion 100 mL/hr at 01/14/23 1501   levETIRAcetam Stopped (01/14/23 1216)   piperacillin-tazobactam (ZOSYN)  IV 3.375 g (01/14/23 1346)   vancomycin 150 mL/hr at 01/14/23 1300   PRN Meds:.acetaminophen **OR** acetaminophen, dextrose, fentaNYL, ondansetron **OR** ondansetron (ZOFRAN) IV, mouth rinse Medications Prior to Admission:  Prior to Admission medications   Medication Sig Start Date End Date Taking? Authorizing Provider  acetaminophen (TYLENOL) 325 MG tablet Take 2 tablets (650 mg total) by mouth every 6 (six) hours as needed for mild pain (or Fever >/= 101). 09/29/22  Yes Leroy Sea, MD  albuterol (VENTOLIN HFA) 108 (90 Base) MCG/ACT inhaler Inhale 2 puffs into the lungs every 4 (four) hours as needed for wheezing or shortness of breath.   Yes [provider]  amLODipine (NORVASC) 10 MG tablet Take 1 tablet (10 mg total) by mouth daily. 09/29/22 09/29/23 Yes Leroy Sea, MD  atorvastatin (LIPITOR) 80 MG tablet Take 80 mg by mouth at bedtime.   Yes [provider]  carvedilol (COREG) 6.25 MG tablet Take 1 tablet (6.25 mg total) by mouth 2 (two) times daily with a meal. 09/22/22 01/13/23 Yes Zigmund Daniel., MD  cetirizine (ZYRTEC) 10 MG tablet Take 10 mg by mouth daily.   Yes [provider]  cholecalciferol (VITAMIN D3) 25 MCG (1000 UNIT) tablet Take 1,000 Units by mouth daily.   Yes [provider]  ciprofloxacin (CIPRO) 500 MG tablet Take 500 mg by mouth 2 (two) times daily.   Yes [provider]  clopidogrel (PLAVIX) 75 MG tablet Take 1 tablet (75 mg total) by mouth daily. 11/15/20  Yes Regalado, Belkys A, MD  diclofenac Sodium (VOLTAREN) 1 % GEL Apply 2 g topically 4 (four) times daily as needed. 10/31/22  Yes Enedina Finner, MD  escitalopram (LEXAPRO) 10 MG tablet Take 1 tablet (10 mg total) by mouth daily. 09/22/22 01/13/23 Yes Zigmund Daniel., MD  hydrALAZINE (APRESOLINE) 50 MG tablet Take 1 tablet (50 mg total) by mouth 3 (three) times daily. 09/29/22 01/13/23 Yes Leroy Sea, MD  insulin aspart (NOVOLOG) 100 UNIT/ML FlexPen Before each meal 3 times a day, 140-199 - 2 units, 200-250 - 4 units, 251-299 - 8 units,  300-349 - 10 units,  350 or above 12 units. I 09/29/22  Yes Leroy Sea, MD  insulin aspart (NOVOLOG) 100 UNIT/ML injection Inject 3 Units into the skin 3 (three) times daily with meals. Hold for NPO or consuming less than 50% of meals Patient taking differently: Inject 3 Units into the skin See admin  instructions. Inject 3 units subcutaneously with meals. HOLD for NPO or consuming < 50% of meals. 09/22/22  Yes Zigmund Daniel., MD  insulin glargine (LANTUS) 100 UNIT/ML injection Inject 0.1 mLs (10 Units total) into the skin 2 (two) times daily. 09/29/22  Yes Leroy Sea, MD   levETIRAcetam (KEPPRA) 500 MG tablet Take 1 tablet (500 mg total) by mouth 2 (two) times daily. 10/06/22  Yes Marinda Elk, MD  mirtazapine (REMERON) 7.5 MG tablet Take 1 tablet (7.5 mg total) by mouth at bedtime. 09/22/22 01/13/23 Yes Zigmund Daniel., MD  Multiple Vitamins-Minerals (MULTIVITAMIN WITH MINERALS) tablet Take 1 tablet by mouth daily.   Yes [provider]  nicotine (NICODERM CQ - DOSED IN MG/24 HOURS) 14 mg/24hr patch Place 1 patch (14 mg total) onto the skin daily. 10/31/22  Yes Enedina Finner, MD  pantoprazole (PROTONIX) 40 MG tablet Take 1 tablet (40 mg total) by mouth daily. 09/22/22 01/13/23 Yes Zigmund Daniel., MD  polyethylene glycol (MIRALAX / GLYCOLAX) 17 g packet Take 17 g by mouth daily. Titrate as needed for constipation. Patient taking differently: Take 17 g by mouth daily. 09/22/22  Yes Zigmund Daniel., MD  senna-docusate (SENOKOT-S) 8.6-50 MG tablet Take 1 tablet by mouth daily. 09/30/22  Yes Leroy Sea, MD  traZODone (DESYREL) 50 MG tablet Take 0.5 tablets (25 mg total) by mouth at bedtime as needed for sleep. 09/22/22 01/13/23 Yes Zigmund Daniel., MD   Allergies  Allergen Reactions   Iodinated Contrast Media Hives   Tramadol Hcl Other (See Comments)    SEIZURE   Sulfa Antibiotics Hives   Shellfish Allergy Nausea And Vomiting and Rash    Scallops specifically    Review of Systems  Unable to perform ROS   Physical Exam Constitutional:      Comments: Eyes closed.   Pulmonary:     Comments: On ventilator.     Vital Signs: BP 107/73   Pulse (!) 139   Temp 99.3 F (37.4 C)   Resp 20   Ht 5' (1.524 m)   Wt 45.6 kg   SpO2 100%   BMI 19.63 kg/m  Pain Scale: CPOT   Pain Score: 0-No pain   SpO2: SpO2: 100 % O2 Device:SpO2: 100 % O2 Flow Rate: .   IO: Intake/output summary:  Intake/Output Summary (Last 24 hours) at 01/14/2023 1507 Last data filed at 01/14/2023 1300 Gross per 24 hour  Intake 7884.06 ml   Output 2895 ml  Net 4989.06 ml    LBM: Last BM Date :  (pta) Baseline Weight: Weight: 45.6 kg Most recent weight: Weight: 45.6 kg        Signed by: Morton Stall, NP   Please contact Palliative Medicine Team phone at [phone] for questions and concerns.  For individual provider: See Loretha Stapler

## 2023-01-14 NOTE — Progress Notes (Signed)
Critical K 2.7 + Lactic Acid still Critical at 2.9  These findings relayed to Webb Silversmith, NP on call.  Orders given

## 2023-01-15 DIAGNOSIS — Z7189 Other specified counseling: Secondary | ICD-10-CM

## 2023-01-15 DIAGNOSIS — I998 Other disorder of circulatory system: Secondary | ICD-10-CM | POA: Diagnosis not present

## 2023-01-15 DIAGNOSIS — G9340 Encephalopathy, unspecified: Secondary | ICD-10-CM

## 2023-01-15 DIAGNOSIS — E874 Mixed disorder of acid-base balance: Secondary | ICD-10-CM

## 2023-01-15 LAB — HEPARIN LEVEL (UNFRACTIONATED)
Heparin Unfractionated: 0.27 IU/mL — ABNORMAL LOW (ref 0.30–0.70)
Heparin Unfractionated: 0.4 IU/mL (ref 0.30–0.70)
Heparin Unfractionated: 0.49 IU/mL (ref 0.30–0.70)

## 2023-01-15 LAB — BASIC METABOLIC PANEL
Anion gap: 5 (ref 5–15)
BUN: 10 mg/dL (ref 8–23)
CO2: 24 mmol/L (ref 22–32)
Calcium: 6.9 mg/dL — ABNORMAL LOW (ref 8.9–10.3)
Chloride: 103 mmol/L (ref 98–111)
Creatinine, Ser: 0.44 mg/dL (ref 0.44–1.00)
GFR, Estimated: >60 mL/min (ref >60)
Glucose, Bld: 101 mg/dL — ABNORMAL HIGH (ref 70–99)
Potassium: 3.9 mmol/L (ref 3.5–5.1)
Sodium: 132 mmol/L — ABNORMAL LOW (ref 135–145)

## 2023-01-15 LAB — GLUCOSE, CAPILLARY
Glucose-Capillary: 99 mg/dL (ref 70–99)
Glucose-Capillary: 74 mg/dL (ref 70–99)
Glucose-Capillary: 100 mg/dL — ABNORMAL HIGH (ref 70–99)
Glucose-Capillary: 76 mg/dL (ref 70–99)
Glucose-Capillary: 72 mg/dL (ref 70–99)
Glucose-Capillary: 79 mg/dL (ref 70–99)

## 2023-01-15 LAB — LEGIONELLA PNEUMOPHILA SEROGP 1 UR AG: L. pneumophila Serogp 1 Ur Ag: NEGATIVE

## 2023-01-15 LAB — HEMOGLOBIN A1C
Hgb A1c MFr Bld: 13.3 % — ABNORMAL HIGH (ref 4.8–5.6)
Mean Plasma Glucose: 335 mg/dL

## 2023-01-15 LAB — MAGNESIUM: Magnesium: 1.7 mg/dL (ref 1.7–2.4)

## 2023-01-15 LAB — CULTURE, BLOOD (ROUTINE X 2)

## 2023-01-15 MED ORDER — INSULIN ASPART 100 UNIT/ML IJ SOLN
0.0000 [IU] | INTRAMUSCULAR | Status: DC
  Administered 2023-01-16: 2 [IU] via SUBCUTANEOUS
  Administered 2023-01-16 (×2): 1 [IU] via SUBCUTANEOUS
  Administered 2023-01-17 (×2): 2 [IU] via SUBCUTANEOUS
  Administered 2023-01-17: 1 [IU] via SUBCUTANEOUS
  Administered 2023-01-17 – 2023-01-18 (×6): 2 [IU] via SUBCUTANEOUS
  Filled 2023-01-15 (×12): qty 1

## 2023-01-15 MED ORDER — HEPARIN BOLUS VIA INFUSION
700.0000 [IU] | Freq: Once | INTRAVENOUS | Status: AC
  Administered 2023-01-15: 700 [IU] via INTRAVENOUS
  Filled 2023-01-15: qty 700

## 2023-01-15 MED ORDER — MAGNESIUM SULFATE 2 GM/50ML IV SOLN
2.0000 g | Freq: Once | INTRAVENOUS | Status: AC
  Administered 2023-01-15: 2 g via INTRAVENOUS
  Filled 2023-01-15: qty 50

## 2023-01-15 MED ORDER — INSULIN GLARGINE-YFGN 100 UNIT/ML ~~LOC~~ SOLN
8.0000 [IU] | Freq: Every day | SUBCUTANEOUS | Status: DC
  Administered 2023-01-15 – 2023-01-18 (×4): 8 [IU] via SUBCUTANEOUS
  Filled 2023-01-15 (×4): qty 0.08

## 2023-01-15 NOTE — Plan of Care (Signed)
Problem: Activity: Goal: Ability to tolerate increased activity will improve Outcome: Not Progressing   Problem: Clinical Measurements: Goal: Ability to maintain a body temperature in the normal range will improve Outcome: Not Progressing   Problem: Respiratory: Goal: Ability to maintain adequate ventilation will improve Outcome: Not Progressing Goal: Ability to maintain a clear airway will improve Outcome: Not Progressing   Problem: Education: Goal: Ability to describe self-care measures that may prevent or decrease complications (Diabetes Survival Skills Education) will improve Outcome: Not Progressing Goal: Individualized Educational Video(s) Outcome: Not Progressing   Problem: Coping: Goal: Ability to adjust to condition or change in health will improve Outcome: Not Progressing   Problem: Fluid Volume: Goal: Ability to maintain a balanced intake and output will improve Outcome: Not Progressing   Problem: Health Behavior/Discharge Planning: Goal: Ability to identify and utilize available resources and services will improve Outcome: Not Progressing Goal: Ability to manage health-related needs will improve Outcome: Not Progressing   Problem: Metabolic: Goal: Ability to maintain appropriate glucose levels will improve Outcome: Not Progressing   Problem: Nutritional: Goal: Maintenance of adequate nutrition will improve Outcome: Not Progressing Goal: Progress toward achieving an optimal weight will improve Outcome: Not Progressing   Problem: Skin Integrity: Goal: Risk for impaired skin integrity will decrease Outcome: Not Progressing   Problem: Tissue Perfusion: Goal: Adequacy of tissue perfusion will improve Outcome: Not Progressing   Problem: Education: Goal: Ability to describe self-care measures that may prevent or decrease complications (Diabetes Survival Skills Education) will improve Outcome: Not Progressing Goal: Individualized Educational  Video(s) Outcome: Not Progressing   Problem: Cardiac: Goal: Ability to maintain an adequate cardiac output will improve Outcome: Not Progressing   Problem: Health Behavior/Discharge Planning: Goal: Ability to identify and utilize available resources and services will improve Outcome: Not Progressing Goal: Ability to manage health-related needs will improve Outcome: Not Progressing   Problem: Fluid Volume: Goal: Ability to achieve a balanced intake and output will improve Outcome: Not Progressing   Problem: Metabolic: Goal: Ability to maintain appropriate glucose levels will improve Outcome: Not Progressing   Problem: Nutritional: Goal: Maintenance of adequate nutrition will improve Outcome: Not Progressing Goal: Maintenance of adequate weight for body size and type will improve Outcome: Not Progressing   Problem: Respiratory: Goal: Will regain and/or maintain adequate ventilation Outcome: Not Progressing   Problem: Urinary Elimination: Goal: Ability to achieve and maintain adequate renal perfusion and functioning will improve Outcome: Not Progressing   Problem: Education: Goal: Knowledge of General Education information will improve Description: Including pain rating scale, medication(s)/side effects and non-pharmacologic comfort measures Outcome: Not Progressing   Problem: Health Behavior/Discharge Planning: Goal: Ability to manage health-related needs will improve Outcome: Not Progressing   Problem: Clinical Measurements: Goal: Ability to maintain clinical measurements within normal limits will improve Outcome: Not Progressing Goal: Will remain free from infection Outcome: Not Progressing Goal: Diagnostic test results will improve Outcome: Not Progressing Goal: Respiratory complications will improve Outcome: Not Progressing Goal: Cardiovascular complication will be avoided Outcome: Not Progressing   Problem: Activity: Goal: Risk for activity intolerance will  decrease Outcome: Not Progressing   Problem: Nutrition: Goal: Adequate nutrition will be maintained Outcome: Not Progressing   Problem: Coping: Goal: Level of anxiety will decrease Outcome: Not Progressing   Problem: Elimination: Goal: Will not experience complications related to bowel motility Outcome: Not Progressing Goal: Will not experience complications related to urinary retention Outcome: Not Progressing   Problem: Pain Managment: Goal: General experience of comfort will improve Outcome: Not Progressing  Problem: Safety: Goal: Ability to remain free from injury will improve Outcome: Not Progressing   Problem: Skin Integrity: Goal: Risk for impaired skin integrity will decrease Outcome: Not Progressing

## 2023-01-15 NOTE — Consult Note (Signed)
ANTICOAGULATION CONSULT NOTE  Pharmacy Consult for Heparin Infusion Indication: arterial clot  Patient Measurements: Height: 5' (152.4 cm) Weight: 45.6 kg (100 lb 8.5 oz) IBW/kg (Calculated) : 45.5 Heparin Dosing Weight: 45.6 kg  Labs: Recent Labs    01/13/23 0828 01/13/23 1001 01/13/23 1359 01/13/23 1603 01/13/23 2130 01/14/23 0554 01/14/23 1440 01/15/23 0414 01/15/23 0657 01/15/23 1236 01/15/23 1844  HGB 14.4  --   --   --   --  11.9*  --   --   --   --   --   HCT 46.9*  --   --   --   --  34.7*  --   --   --   --   --   PLT 337  --   --   --   --  207  --   --   --   --   --   APTT 27  --   --   --   --   --   --   --   --   --   --   LABPROT 16.1*  --   --   --   --  14.4  --   --   --   --   --   INR 1.3*  --   --   --   --  1.1  --   --   --   --   --   HEPARINUNFRC  --    < > >1.10*  --    < >  --   --  0.27*  --  0.40 0.49  CREATININE 1.07*  --  0.86 0.71   < > 0.42* 0.43*  --  0.44  --   --   TROPONINIHS  --   --  1,356* 2,875*  --   --   --   --   --   --   --    < > = values in this interval not displayed.    Estimated Creatinine Clearance: 47.7 mL/min (by C-G formula based on SCr of 0.44 mg/dL).  Medical History: Past Medical History:  Diagnosis Date   (HFpEF) heart failure with preserved ejection fraction (HCC)    Angina pectoris (HCC)    Anxiety    Aortic atherosclerosis (HCC)    Basal cell carcinoma    Bilateral carotid artery disease (HCC)    a.) carotid doppler 03/18/2021: 1-39% RICA, 40-59% LICA   Bipolar disorder (HCC)    Cervicalgia    Chronic mesenteric ischemia (HCC)    a.) s/p PTA 11/13/2020 --> 6 x 16 mm lifestream ballon expanding stent to SMA   COPD (chronic obstructive pulmonary disease) (HCC)    Coronary artery disease 03/20/2014   a.) LHC/PCI 03/20/2014: 70% mLAD (2.5 x 28 mm Xience Alpine DES), 70% dLCx, 70% dRCA; b.) LHC 01/25/2016: 90% ISR mLAD --> mLAD dissection with in stent --> 2.75 x 22 mm Resolute DES; c.) R/LHC 09/29/2016:  50% ISR mLAD, 70% dLAD, mPA 37, PCWP 24, AO sat 90%, PA sat 46%, CO 3.7, CI 2.46 - med mgmt   DDD (degenerative disc disease), cervical    Depression    Diabetic polyneuropathy (HCC)    GERD (gastroesophageal reflux disease)    Hyperlipidemia    Hypertension    Long term current use of antithrombotics/antiplatelets    a.) on DAPT (ASA + clopidogrel)   Memory loss    NSTEMI (non-ST elevated myocardial infarction) (HCC) 07/2016  a.) troponins trended (normal < 0.034 ng/mL): 0.325 --> 1.480 --> 2.330 --> 1.660 --> 0.169 ng/mL   PAD (peripheral artery disease) (HCC)    a.) s/p PTA and stenting SMA 11/13/2020; b.) s/p PTA and stenting of RIGHT SFA and popliteal 04/12/2021; c.) s/p PTA and kissing balloon stents to BILATERAL CIAs 05/28/2021   Respiratory failure with hypoxia (HCC)    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  Assessment: 72 yo female presented by EMS to the ED due to weakness and confusion.  Pharmacy consulted to initiate heparin infusion for arterial clot.   Goal of Therapy:  Heparin level 0.3-0.7 units/ml Monitor platelets by anticoagulation protocol: Yes   Plan:  --Heparin level is therapeutic x 2 --Continue heparin infusion at 650 units/hr --Re-check HL tomorrow AM --Daily CBC per protocol while on IV heparin  Heather [name] 01/15/2023,7:04 PM

## 2023-01-15 NOTE — Progress Notes (Addendum)
Daily Progress Note   Patient Name: Heather [name]       Date: 01/15/2023 DOB: 1951-06-27  Age: 72 y.o. MRN#: [id number] Attending Physician: Erin Fulling, MD Primary Care Physician: Toy Cookey, FNP Admit Date: 01/06/2023  Reason for Consultation/Follow-up: Establishing goals of care  Subjective: Notes and labs reviewed.  In to see patient.  Patient's daughter Nicoletta Ba is at bedside, and other daughter is on speaker phone/video chat to see patient.  Patient is awake and currently resting in bed on ventilator support.  Daughter states that she has been able to nod her head that she would want to be taken off the ventilator.  They again discuss patient's husband and his significantly increased dementia.  They discussed the patient has been bedbound since December.  Daughters share that patient has been in rehab previously, and discuss that she checked herself out of rehab facility x 3, against providers advice and family's advice.  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 Daughters share that in the past patient has stated that she would only want to be on a ventilator for a very short period of time, and would never want to live in a nursing facility.  They state that given the decline of their mother and stepfather prior to this admission, that they were almost at a point of moving them to long-term care before this admission.    Daughter is asking questions attempting to determine patient's wishes.  We attempted to speak with patient and ask yes and no questions but she was confused and was unable to correctly answer all orientation questions.She was unaware of her previous amputation, and was not able to definitively provide decisions to her daughters.  They are hopeful that confusion will improve  and she will be able to guide her own care.  They state that they are now in a place of wanting to honor her wishes what ever they are.  They are considering one-way extubation and then seeing how patient does from there.  ADDENDUM: Returned to bedside.  RT in at bedside.  Daughter remains on speaker phone, and daughters asked patient if she was ready to meet Jesus and if she was ready to die, and if she was ready to go home and be with her mother and other family members.  Patient nodded her head  that yes she was ready to go and be with Jesus.  She also nodded her head yes that she was ready to die.  Discussed one-way extubation and then this seeing how she does moving forward.   Upon stepping out to discuss this with CCM, daughter came to desk to state that they had decided to keep the patient intubated.  CCM is aware.  Per nursing, they advised that they would like time and space to make decisions.  Length of Stay: 2  Current Medications: Scheduled Meds:   Chlorhexidine Gluconate Cloth  6 each Topical Daily   docusate  100 mg Per Tube BID   folic acid  1 mg Intravenous Daily   insulin aspart  0-9 Units Subcutaneous Q4H   insulin glargine-yfgn  8 Units Subcutaneous Daily   levalbuterol  0.63 mg Nebulization Q6H   mouth rinse  15 mL Mouth Rinse Q2H   pantoprazole (PROTONIX) IV  40 mg Intravenous Q24H   polyethylene glycol  17 g Per Tube Daily   sodium chloride flush  3 mL Intravenous Q12H   thiamine (VITAMIN B1) injection  100 mg Intravenous Daily    Continuous Infusions:  fentaNYL infusion INTRAVENOUS 100 mcg/hr (01/15/23 1000)   heparin 650 Units/hr (01/15/23 1000)   lactated ringers 1,000 mL with potassium chloride 20 mEq infusion 100 mL/hr at 01/15/23 1124   levETIRAcetam 500 mg (01/15/23 1226)   piperacillin-tazobactam (ZOSYN)  IV 12.5 mL/hr at 01/15/23 1000   vancomycin 750 mg (01/15/23 1224)    PRN Meds: acetaminophen **OR** acetaminophen, dextrose, fentaNYL, ondansetron  **OR** ondansetron (ZOFRAN) IV, mouth rinse  Physical Exam Pulmonary:     Comments: Ventilator support Neurological:     Mental Status: She is alert.             Vital Signs: BP 113/73   Pulse (!) 123   Temp 99.9 F (37.7 C)   Resp 20   Ht 5' (1.524 m)   Wt 45.6 kg   SpO2 100%   BMI 19.63 kg/m  SpO2: SpO2: 100 % O2 Device: O2 Device: Ventilator O2 Flow Rate:    Intake/output summary:  Intake/Output Summary (Last 24 hours) at 01/15/2023 1251 Last data filed at 01/15/2023 1000 Gross per 24 hour  Intake 3323.49 ml  Output 800 ml  Net 2523.49 ml   LBM: Last BM Date :  (PTA) Baseline Weight: Weight: 45.6 kg Most recent weight: Weight: 45.6 kg        Patient Active Problem List   Diagnosis Date Noted   Acute lower limb ischemia 01/14/2023   Acute encephalopathy 01/01/2023   DKA (diabetic ketoacidosis) (HCC) 01/03/2023   Critical limb ischemia of left lower extremity (HCC) 01/10/2023   Acidosis, metabolic, with respiratory acidosis 01/14/2023   CAP (community acquired pneumonia) 12/25/2022   Transaminitis 11/08/2022   Right knee pain 10/24/2022   History of seizure 10/23/2022   Hypoglycemia 10/21/2022   Pressure injury of skin 10/21/2022   Weakness 10/21/2022   Cellulitis of right groin 10/18/2022   Generalized weakness 10/18/2022   Fall at home, initial encounter 10/18/2022   Open wound of inguinal region with complication 10/18/2022   Hx of AKA (above knee amputation), left (HCC) 10/18/2022   Uncontrolled type 2 diabetes mellitus with hyperglycemia, with long-term current use of insulin (HCC) 10/18/2022   Failure to thrive in adult 10/01/2022   Acute metabolic encephalopathy 10/01/2022   Normocytic anemia 10/01/2022   Surgical site infection 09/15/2022   Decreased oral intake 09/13/2022  Chronic diastolic CHF (congestive heart failure) (HCC) 09/13/2022   Dysphagia 09/13/2022   PVD (peripheral vascular disease) (HCC) 09/11/2022   Right groin wound 09/10/2022    Wound of left groin 09/10/2022   Pulmonary edema 08/16/2022   Abscess of anal and rectal regions 08/15/2022   Decubitus ulcer of sacral region, stage 2 (HCC) 08/15/2022   History of intracranial hemorrhage 08/04/2022   Atherosclerosis of native arteries of extremity with rest pain (HCC) 04/02/2022   PAD (peripheral artery disease) (HCC) 04/11/2021   Ischemic leg pain 04/11/2021   Chronic mesenteric ischemia (HCC) 12/17/2020   Malnutrition of moderate degree 11/12/2020   Asymptomatic bacteriuria 11/10/2020   Abdominal pain 11/09/2020   Hypokalemia 11/09/2020   AKI (acute kidney injury) (HCC) 11/09/2020   MDD (major depressive disorder), recurrent, in full remission (HCC) 01/16/2020   Anxiety disorder 01/16/2020   Hyperlipidemia 12/15/2019   Carotid stenosis 12/15/2019   MDD (major depressive disorder), recurrent, in partial remission (HCC) 08/24/2019   MDD (major depressive disorder), recurrent episode, mild (HCC) 04/08/2019   Insomnia due to mental condition 04/08/2019   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, without behavioral disturbance (HCC) 04/08/2019   DDD (degenerative disc disease), cervical 12/19/2016   Frequent falls 12/10/2016   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   Nonrheumatic aortic valve stenosis 09/30/2016   (HFpEF) heart failure with preserved ejection fraction (HCC) 09/20/2016   COPD (chronic obstructive pulmonary disease) (HCC) 09/20/2016   Respiratory failure with hypoxia (HCC) 09/20/2016   NSTEMI (non-ST elevated myocardial infarction) (HCC) 08/21/2016   SOB (shortness of breath) 08/18/2016   Atherosclerosis of native arteries of extremity with intermittent claudication (HCC) 01/08/2016   Palpitations 07/10/2015   History of nonmelanoma skin cancer 03/16/2015   Pseudoaneurysm following procedure (HCC) 07/19/2014   Femoral artery occlusion, right (HCC) 06/28/2014   Claudication (HCC) 06/05/2014   Aortic  insufficiency 03/20/2014   Coronary artery disease 03/20/2014   Chronic pain 03/20/2014   Memory loss 11/09/2012   Depressive disorder 08/02/2012   Neck pain 08/02/2012   Malaise and fatigue 08/02/2012   Hypertension, benign 08/02/2012   Dizziness 08/02/2012   Polyneuropathy in diabetes (HCC) 08/02/2012   Rheumatic fever with heart involvement 08/02/2012   Tobacco use disorder 08/02/2012   Diabetes mellitus (HCC) 08/02/1990    Palliative Care Assessment & Plan    Recommendations/Plan: PMT will follow-up on Sunday to allow space for decision making, as per family request to nursing. Code Status:    Code Status Orders  (From admission, onward)           Start     Ordered   01/14/23 1124  Do not attempt resuscitation (DNR)  Continuous       Question Answer Comment  If patient has no pulse and is not breathing Do Not Attempt Resuscitation   If patient has a pulse and/or is breathing: Medical Treatment Goals MEDICAL INTERVENTIONS DESIRED: Use advanced airway interventions, mechanical ventilation or cardioversion in appropriate circumstances; Use medication/IV fluids as indicated; Provide comfort medications; Transfer to Progressive/Stepdown/ICU as indicated.   Consent: Discussion documented in EHR or advanced directives reviewed      05 /22/24 1123           Code Status History     Date Active Date Inactive Code Status Order ID Comments User Context   01/06/2023 1317 01/14/2023 1123 Full Code [id number]  Verdene Lennert, MD ED   10/23/2022 2333 10/31/2022 1603 Full Code [id number]  Gertha Calkin, MD ED  10/18/2022 0237 10/22/2022 2231 Full Code [id number]  Hannah Beat, MD ED   10/01/2022 1915 10/06/2022 1751 Full Code [id number]  Clydie Braun, MD Inpatient   09/28/2022 0101 09/30/2022 2041 Full Code [id number]  Hillary Bow, DO ED   08/04/2022 0103 09/22/2022 2128 Full Code [id number]  Erick Blinks, MD Inpatient   03/25/2022 1112 03/25/2022 1914 Full Code [id number]  Renford Dills, MD Inpatient   05/28/2021 1207 05/28/2021 1916 Full Code [id number]  Renford Dills, MD Inpatient   04/12/2021 1644 04/13/2021 0235 Full Code [id number]  Gilda Crease Latina Craver, MD Inpatient   11/09/2020 2351 11/15/2020 1556 Full Code [id number]  Arvilla Market, DO ED       Prognosis: Poor overall     Thank you for allowing the Palliative Medicine Team to assist in the care of this patient.     Morton Stall, NP  Please contact Palliative Medicine Team phone at [phone] for questions and concerns.

## 2023-01-15 NOTE — Progress Notes (Addendum)
PHARMACY CONSULT NOTE  Pharmacy Consult for Electrolyte Monitoring and Replacement   Recent Labs: Potassium (mmol/L)  Date Value  01/15/2023 3.9  06/20/2012 4.0   Magnesium (mg/dL)  Date Value  16/05/9603 1.7   Calcium (mg/dL)  Date Value  54/04/8118 6.9 (L)   Calcium, Total (mg/dL)  Date Value  14/78/2956 8.8   Albumin (g/dL)  Date Value  21/30/8657 1.9 (L)  06/20/2012 3.5   Phosphorus (mg/dL)  Date Value  84/69/6295 3.3   Sodium (mmol/L)  Date Value  01/15/2023 132 (L)  06/20/2012 135 (L)     Assessment:  72 y.o. female with medical history significant of HFpEF, CAD s/p DES to LAD (2015), rheumatic heart disease s/p TAVR, T2DM, HTN, HLD, PAD s/p multiple stents and left AKA, COPD, who presented to the ED with c/o altered mental status now in CCU intubated & sedated  MIVF: lactated ringers w/ KCl 20 mEq/liter   Goal of Therapy:  Electrolytes WNL  Plan:  ---2 grams IV magnesium sulfate x 1 ---recheck electrolytes in am  Lowella Bandy ,PharmD Clinical Pharmacist 01/15/2023 7:47 AM

## 2023-01-15 NOTE — Progress Notes (Addendum)
Initial Nutrition Assessment  DOCUMENTATION CODES:   Non-severe (moderate) malnutrition in context of chronic illness  INTERVENTION:   -If aggressive care is warranted and pt unable to extubated within 48 hours, consider:  Initiate Vital AF 1.2 @ 20 ml/hr via OGT and increase by 10 ml every 4 hours to goal rate of 55 ml/hr.   30 free water flush every 4 hours to maintain tube patency  Tube feeding regimen provides 1584 kcal (100% of needs), 99 grams of protein, and 1071 ml of H2O.  Total free water: 1251 ml daily  NUTRITION DIAGNOSIS:   Moderate Malnutrition related to chronic illness (COPD, PAD) as evidenced by percent weight loss, mild fat depletion, moderate fat depletion, mild muscle depletion, moderate muscle depletion.  GOAL:   Patient will meet greater than or equal to 90% of their needs  MONITOR:   Vent status  REASON FOR ASSESSMENT:   Ventilator    ASSESSMENT:   Pt with medical history significant of HFpEF, CAD s/p DES to LAD (2015), rheumatic heart disease s/p TAVR, T2DM, HTN, HLD, PAD s/p multiple stents and left AKA, COPD, who presents with c/o altered mental status.  Pt admitted with AMS, DKA, and lower leg mottling.   Patient is currently intubated on ventilator support. 18 french OGT currently connected to low, intermittent suction; tube position verified by x-ray on 01/13/23. MV: 9 L/min Temp (24hrs), Avg:100.5 F (38.1 C), Min:99.7 F (37.6 C), Max:101.3 F (38.5 C)  Reviewed I/O's: +2.4 L x 24 hours and +6.2 L since admission  UOP: 995 ml x 24 hours  Per vascular surgery notes, no vascular options.   Pt with cognitive decline for the past few weeks PTA.   Case discussed with MD, RN, and during ICU rounds. Pt currently alert and responding to commands. She has OGT to suction. Per MD, likely plan to extubate today with one way extubation. She is a DNR. Palliative care consulted; suspect potential one way extubation per MD.   No family present at  visit. Pt opened eyes when touched.   Reviewed wt hx; pt has experienced a 18% wt loss over the past 3 months, which is significant for time frame.   Medications reviewed and include colace, folic acid, protonix, miralax, thiamine, keppra, and lactated ringers @ 100 ml/hr.   Lab Results  Component Value Date   HGBA1C 13.3 (H) 01/13/2023   PTA DM medications are 10 units insulin glargine BID, 3 units insulin aspart TID, and SSI insulin aspart TID.   Labs reviewed:  Na: 132, CBGS: 100. (inpatient orders for glycemic control are 0-9 units insulin aspart every 4 hours and 8 units insulin glargine-yfgn daily).    NUTRITION - FOCUSED PHYSICAL EXAM:  Flowsheet Row Most Recent Value  Orbital Region Mild depletion  Upper Arm Region Mild depletion  Thoracic and Lumbar Region No depletion  Buccal Region No depletion  Temple Region Mild depletion  Clavicle Bone Region No depletion  Clavicle and Acromion Bone Region No depletion  Scapular Bone Region No depletion  Dorsal Hand Moderate depletion  Patellar Region Moderate depletion  Anterior Thigh Region Moderate depletion  Posterior Calf Region Moderate depletion  Edema (RD Assessment) Mild  Hair Reviewed  Eyes Reviewed  Mouth Reviewed  Skin Reviewed  Nails Reviewed       Diet Order:   Diet Order             Diet NPO time specified  Diet effective now  EDUCATION NEEDS:   No education needs have been identified at this time  Skin:  Skin Assessment: Skin Integrity Issues: Skin Integrity Issues:: Stage II, DTI DTI: buttocks Stage II: sacrum  Last BM:  Unknown  Height:   Ht Readings from Last 1 Encounters:  01/13/23 5' (1.524 m)    Weight:   Wt Readings from Last 1 Encounters:  01/13/23 45.6 kg    Ideal Body Weight:  41.9 kg (adjusted for lt AKA)  BMI:  Body mass index is 19.63 kg/m.  Estimated Nutritional Needs:   Kcal:  1527  Protein:  85-100 grams  Fluid:  1.5-1.7 L    Levada Schilling, RD, LDN, CDCES Registered Dietitian II Certified Diabetes Care and Education Specialist Please refer to [facility] for RD and/or RD on-call/weekend/after hours pager

## 2023-01-15 NOTE — Consult Note (Signed)
ANTICOAGULATION CONSULT NOTE  Pharmacy Consult for Heparin Infusion Indication: arterial clot  Patient Measurements: Height: 5' (152.4 cm) Weight: 45.6 kg (100 lb 8.5 oz) IBW/kg (Calculated) : 45.5 Heparin Dosing Weight: 45.6 kg  Labs: Recent Labs    01/13/23 0828 01/13/23 1001 01/13/23 1359 01/13/23 1603 01/13/23 2130 01/14/23 0321 01/14/23 0554 01/14/23 1440 01/15/23 0414 01/15/23 0657 01/15/23 1236  HGB 14.4  --   --   --   --   --  11.9*  --   --   --   --   HCT 46.9*  --   --   --   --   --  34.7*  --   --   --   --   PLT 337  --   --   --   --   --  207  --   --   --   --   APTT 27  --   --   --   --   --   --   --   --   --   --   LABPROT 16.1*  --   --   --   --   --  14.4  --   --   --   --   INR 1.3*  --   --   --   --   --  1.1  --   --   --   --   HEPARINUNFRC  --    < > >1.10*  --    < > 0.33  --   --  0.27*  --  0.40  CREATININE 1.07*  --  0.86 0.71   < >  --  0.42* 0.43*  --  0.44  --   TROPONINIHS  --   --  1,356* 2,875*  --   --   --   --   --   --   --    < > = values in this interval not displayed.    Estimated Creatinine Clearance: 47.7 mL/min (by C-G formula based on SCr of 0.44 mg/dL).  Medical History: Past Medical History:  Diagnosis Date   (HFpEF) heart failure with preserved ejection fraction (HCC)    Angina pectoris (HCC)    Anxiety    Aortic atherosclerosis (HCC)    Basal cell carcinoma    Bilateral carotid artery disease (HCC)    a.) carotid doppler 03/18/2021: 1-39% RICA, 40-59% LICA   Bipolar disorder (HCC)    Cervicalgia    Chronic mesenteric ischemia (HCC)    a.) s/p PTA 11/13/2020 --> 6 x 16 mm lifestream ballon expanding stent to SMA   COPD (chronic obstructive pulmonary disease) (HCC)    Coronary artery disease 03/20/2014   a.) LHC/PCI 03/20/2014: 70% mLAD (2.5 x 28 mm Xience Alpine DES), 70% dLCx, 70% dRCA; b.) LHC 01/25/2016: 90% ISR mLAD --> mLAD dissection with in stent --> 2.75 x 22 mm Resolute DES; c.) R/LHC 09/29/2016:  50% ISR mLAD, 70% dLAD, mPA 37, PCWP 24, AO sat 90%, PA sat 46%, CO 3.7, CI 2.46 - med mgmt   DDD (degenerative disc disease), cervical    Depression    Diabetic polyneuropathy (HCC)    GERD (gastroesophageal reflux disease)    Hyperlipidemia    Hypertension    Long term current use of antithrombotics/antiplatelets    a.) on DAPT (ASA + clopidogrel)   Memory loss    NSTEMI (non-ST elevated myocardial infarction) (HCC) 07/2016  a.) troponins trended (normal < 0.034 ng/mL): 0.325 --> 1.480 --> 2.330 --> 1.660 --> 0.169 ng/mL   PAD (peripheral artery disease) (HCC)    a.) s/p PTA and stenting SMA 11/13/2020; b.) s/p PTA and stenting of RIGHT SFA and popliteal 04/12/2021; c.) s/p PTA and kissing balloon stents to BILATERAL CIAs 05/28/2021   Respiratory failure with hypoxia (HCC)    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  Assessment: 72 yo female presented by EMS to the ED due to weakness and confusion.  Pharmacy consulted to initiate heparin infusion for arterial clot.   Goal of Therapy:  Heparin level 0.3-0.7 units/ml Monitor platelets by anticoagulation protocol: Yes   Plan: heparin level therapeutic following recent rate increase ---continue heparin infusion at  650 units/hr. ---recheck heparin level in 6 hrs  ----Daily CBC per protocol while on IV heparin  Lowella Bandy 01/15/2023,1:36 PM

## 2023-01-15 NOTE — Consult Note (Signed)
ANTICOAGULATION CONSULT NOTE  Pharmacy Consult for Heparin Infusion Indication: arterial clot  Patient Measurements: Height: 5' (152.4 cm) Weight: 45.6 kg (100 lb 8.5 oz) IBW/kg (Calculated) : 45.5 Heparin Dosing Weight: 45.6 kg  Labs: Recent Labs    01/13/23 0828 01/13/23 1001 01/13/23 1359 01/13/23 1603 01/13/23 2130 01/14/23 0027 01/14/23 0321 01/14/23 0554 01/14/23 1440 01/15/23 0414  HGB 14.4  --   --   --   --   --   --  11.9*  --   --   HCT 46.9*  --   --   --   --   --   --  34.7*  --   --   PLT 337  --   --   --   --   --   --  207  --   --   APTT 27  --   --   --   --   --   --   --   --   --   LABPROT 16.1*  --   --   --   --   --   --  14.4  --   --   INR 1.3*  --   --   --   --   --   --  1.1  --   --   HEPARINUNFRC  --    < > >1.10*  --  0.54  --  0.33  --   --  0.27*  CREATININE 1.07*  --  0.86 0.71 0.55 0.50  --  0.42* 0.43*  --   TROPONINIHS  --   --  1,356* 2,875*  --   --   --   --   --   --    < > = values in this interval not displayed.    Estimated Creatinine Clearance: 47.7 mL/min (A) (by C-G formula based on SCr of 0.43 mg/dL (L)).  Medical History: Past Medical History:  Diagnosis Date   (HFpEF) heart failure with preserved ejection fraction (HCC)    Angina pectoris (HCC)    Anxiety    Aortic atherosclerosis (HCC)    Basal cell carcinoma    Bilateral carotid artery disease (HCC)    a.) carotid doppler 03/18/2021: 1-39% RICA, 40-59% LICA   Bipolar disorder (HCC)    Cervicalgia    Chronic mesenteric ischemia (HCC)    a.) s/p PTA 11/13/2020 --> 6 x 16 mm lifestream ballon expanding stent to SMA   COPD (chronic obstructive pulmonary disease) (HCC)    Coronary artery disease 03/20/2014   a.) LHC/PCI 03/20/2014: 70% mLAD (2.5 x 28 mm Xience Alpine DES), 70% dLCx, 70% dRCA; b.) LHC 01/25/2016: 90% ISR mLAD --> mLAD dissection with in stent --> 2.75 x 22 mm Resolute DES; c.) R/LHC 09/29/2016: 50% ISR mLAD, 70% dLAD, mPA 37, PCWP 24, AO sat 90%,  PA sat 46%, CO 3.7, CI 2.46 - med mgmt   DDD (degenerative disc disease), cervical    Depression    Diabetic polyneuropathy (HCC)    GERD (gastroesophageal reflux disease)    Hyperlipidemia    Hypertension    Long term current use of antithrombotics/antiplatelets    a.) on DAPT (ASA + clopidogrel)   Memory loss    NSTEMI (non-ST elevated myocardial infarction) (HCC) 07/2016   a.) troponins trended (normal < 0.034 ng/mL): 0.325 --> 1.480 --> 2.330 --> 1.660 --> 0.169 ng/mL   PAD (peripheral artery disease) (HCC)    a.) s/p  PTA and stenting SMA 11/13/2020; b.) s/p PTA and stenting of RIGHT SFA and popliteal 04/12/2021; c.) s/p PTA and kissing balloon stents to BILATERAL CIAs 05/28/2021   Respiratory failure with hypoxia (HCC)    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  Assessment: 72 yo female presented by EMS to the ED due to weakness and confusion.  Pharmacy consulted to initiate heparin infusion for arterial clot.   0521 1359 HL > 1.1, suprathera; 750 un/hr Heparin level was drawn 2.5 hours early 0521 2130 HL = 0.54, therapeutic X 1  0522 0321 HL = 0.33, therapeutic X 2  0523 0414 HL = 0.27, SUBtherapeutic   Goal of Therapy:  Heparin level 0.3-0.7 units/ml Monitor platelets by anticoagulation protocol: Yes   Plan:  5/23: HL @ 0414 = 0.27, SUBtherapeutic  - Will order heparin 700 units IV X 1 bolus and increase drip rate to 650 units/hr. - Will recheck HL 6 hrs after rate change  ----Daily CBC per protocol while on IV heparin  Olie Scaffidi D 01/15/2023,5:37 AM

## 2023-01-15 NOTE — Progress Notes (Signed)
NAME:  Heather [name], MRN:  [id number], DOB:  Dec 25, 1950, LOS: 2 ADMISSION DATE:  01/13/2023 CHIEF COMPLAINT:  resp distress   History of Present Illness:  72 y.o. female with medical history significant of HFpEF, CAD s/p DES to LAD (2015), rheumatic heart disease s/p TAVR, T2DM, HTN, HLD, PAD s/p multiple stents and left AKA, COPD, who presents to the ED with c/o altered mental status.    Patient with severe metabolic encephalopathy patient's husband, there has been a slow decline over the last few weeks, however patient became suddenly altered yesterday evening and began to speak without sense  her right lower extremity has become paler over the last several weeks and he has not seen her move it recently but moves it for her intermittently.   ED course: On arrival to the ED, patient was hypertensive at 129/92 with heart rate of 149.  She was saturating at 100% on room air.  She was afebrile at 97.6.  Initial workup with VBG demonstrating pH of 7.14, pCO2 less than 18, glucose of 605, bicarb less than 7, potassium 3.2, creatinine 1.07 with GFR 56, albumin 2.8, WBC 21.6, lactic acid 2.9, INR 1.3, and urinalysis with ketonuria.  CT of the head with no acute intracranial abnormalities.  Chest x-ray with concerns for pneumonia.  Patient started on IV antibiotics for pneumonia.  Patient started on IV insulin for DKA.  Vascular surgery consulted for ischemic limb; patient started on IV heparin.  TRH contacted for admission, PCCM asked to Consult  Multiple admission in the past Cyanosis from waist down Severe ABD pain Critical limb ischemia   Significant Hospital Events: Including procedures, antibiotic start and stop dates in addition to other pertinent events   5/21 ICU admission for acidosis and encephalopathy 5/22 remains on vent, remains critically ill, DNR status per family request 5/23 remains on vent, remains critically ill     Micro Data:  CULTURES PENDING  Antimicrobials:    Antibiotics Given (last 72 hours)     Date/Time Action Medication Dose Rate   01/13/23 1014 New Bag/Given   ceFEPIme (MAXIPIME) 2 g in sodium chloride 0.9 % 100 mL IVPB 2 g 200 mL/hr   01/13/23 1101 New Bag/Given   vancomycin (VANCOCIN) IVPB 1000 mg/200 mL premix 1,000 mg 200 mL/hr   01/13/23 1414 New Bag/Given   azithromycin (ZITHROMAX) 500 mg in sodium chloride 0.9 % 250 mL IVPB 500 mg 250 mL/hr   01/13/23 1952 New Bag/Given   piperacillin-tazobactam (ZOSYN) IVPB 3.375 g 3.375 g 12.5 mL/hr   01/14/23 0645 New Bag/Given   piperacillin-tazobactam (ZOSYN) IVPB 3.375 g 3.375 g 12.5 mL/hr   01/14/23 1219 New Bag/Given   vancomycin (VANCOREADY) IVPB 750 mg/150 mL 750 mg 150 mL/hr   01/14/23 1346 New Bag/Given   piperacillin-tazobactam (ZOSYN) IVPB 3.375 g 3.375 g 12.5 mL/hr   01/14/23 2138 New Bag/Given   piperacillin-tazobactam (ZOSYN) IVPB 3.375 g 3.375 g 12.5 mL/hr   01/15/23 4098 New Bag/Given   piperacillin-tazobactam (ZOSYN) IVPB 3.375 g 3.375 g 12.5 mL/hr            Interim History / Subjective:  Remains Mottled from waist down Cyanosis of RT leg, Cyanosis of left Stump(s/p L AKA) Severe resp failure Remains on vent Remains critically ill Prognosis is grave   Vent Mode: PRVC FiO2 (%):  [30 %-35 %] 30 % Set Rate:  [20 bmp] 20 bmp Vt Set:  [450 mL] 450 mL PEEP:  [5 cmH20] 5 cmH20 Plateau Pressure:  [20  cmH20] 20 cmH20     Objective   Blood pressure 109/65, pulse (!) 132, temperature (!) 100.8 F (38.2 C), resp. rate 20, height 5' (1.524 m), weight 45.6 kg, SpO2 100 %.    Vent Mode: PRVC FiO2 (%):  [30 %-35 %] 30 % Set Rate:  [20 bmp] 20 bmp Vt Set:  [450 mL] 450 mL PEEP:  [5 cmH20] 5 cmH20 Plateau Pressure:  [20 cmH20] 20 cmH20   Intake/Output Summary (Last 24 hours) at 01/15/2023 0736 Last data filed at 01/15/2023 3086 Gross per 24 hour  Intake 3400.06 ml  Output 995 ml  Net 2405.06 ml    Filed Weights   01/13/23 0900 01/13/23 0926  Weight:  45.6 kg 45.6 kg      REVIEW OF SYSTEMS  PATIENT IS UNABLE TO PROVIDE COMPLETE REVIEW OF SYSTEMS DUE TO SEVERE CRITICAL ILLNESS   PHYSICAL EXAMINATION:  GENERAL:critically ill appearing, +resp distress EYES: Pupils equal, round, reactive to light.  No scleral icterus.  MOUTH: Moist mucosal membrane. INTUBATED NECK: Supple.  PULMONARY: Lungs clear to auscultation, +rhonchi, +wheezing CARDIOVASCULAR: S1 and S2.  Regular rate and rhythm GASTROINTESTINAL: Soft, nontender, -distended. Positive bowel sounds.  MUSCULOSKELETAL:+cyanosis, mottled skin, left AKA NEUROLOGIC: obtunded,sedated SKIN:abnormal, cold to touch, Capillary refill delayed  Weak pulses RT foot   Labs/imaging that I havepersonally reviewed  (right click and "Reselect all SmartList Selections" daily)      ASSESSMENT AND PLAN SYNOPSIS  72 yo white female with multiple medical issues with severe metabolic acidosis due to severe DKA with severe metabolic encephalopathy with acute cyanosis of lower half of her body mottled skin with critical limb ischemia active smoker, possible aspiration pneumonia  Severe ACUTE Hypoxic and Hypercapnic Respiratory Failure -continue Mechanical Ventilator support -Wean Fio2 and PEEP as tolerated -VAP/VENT bundle implementation - Wean PEEP & FiO2 as tolerated, maintain SpO2 > 88% - Head of bed elevated 30 degrees, VAP protocol in place - Plateau pressures less than 30 cm H20  - Intermittent chest x-ray & ABG PRN - Ensure adequate pulmonary hygiene  UNABLE TO WEAN FROM VENT   HIGH RISK FOR CARDIAC ARREST  CARDIAC ICU monitoring   RENAL-SEVERE ACIDOSIS CONTINUE BICARB INFUSION -Avoid nephrotoxic agents -Follow urine output, BMP -Ensure adequate renal perfusion, optimize oxygenation -Renal dose medications   Intake/Output Summary (Last 24 hours) at 01/15/2023 0736 Last data filed at 01/15/2023 5784 Gross per 24 hour  Intake 3400.06 ml  Output 995 ml  Net 2405.06 ml       NEUROLOGY ACUTE METABOLIC ENCEPHALOPATHY -need for sedation -Goal RASS -2 to -3  SEPTIC shock SOURCE-LIMB ISCHEMIA, PNEUMONIA -use vasopressors to keep MAP>65 as needed -follow ABG and LA as needed -follow up cultures -emperic ABX    INFECTIOUS DISEASE -continue antibiotics as prescribed -follow up cultures   ENDO - ICU hypoglycemic\Hyperglycemia protocol -check FSBS per protocol   GI GI PROPHYLAXIS as indicated  NUTRITIONAL STATUS DIET-->TF's as tolerated Constipation protocol as indicated   ELECTROLYTES -follow labs as needed -replace as needed -pharmacy consultation and following    ACUTE ANEMIA- TRANSFUSE AS NEEDED CONSIDER TRANSFUSION  IF HGB<7      Best practice (right click and "Reselect all SmartList Selections" daily)  Diet: NPO Central venous access:  N/A Arterial line:  N/A Foley:  N/A Mobility:  bed rest  Code Status: DNR/DNI Disposition:ICU  Labs   CBC: Recent Labs  Lab 01/13/23 0828 01/14/23 0554  WBC 21.6* 13.4*  NEUTROABS 17.7*  --   HGB 14.4 11.9*  HCT 46.9* 34.7*  MCV 90.2 81.8  PLT 337 207     Basic Metabolic Panel: Recent Labs  Lab 01/13/23 1359 01/13/23 1603 01/13/23 2130 01/14/23 0027 01/14/23 0554 01/14/23 1440 01/15/23 0657  NA 138   < > 138 139 135 134* 132*  K 4.3   < > 3.0* 2.7* 3.9 3.5 3.9  CL 113*   < > 100 101 98 101 103  CO2 <7*   < > 26 29 28 27 24   GLUCOSE 377*   < > 265* 247* 150* 70 101*  BUN 19   < > 15 13 11 10 10   CREATININE 0.86   < > 0.55 0.50 0.42* 0.43* 0.44  CALCIUM 7.8*   < > 7.0* 7.1* 6.8* 6.6* 6.9*  MG 1.5*  --   --   --   --   --  1.7  PHOS 3.3  --   --   --   --   --   --    < > = values in this interval not displayed.    GFR: Estimated Creatinine Clearance: 47.7 mL/min (by C-G formula based on SCr of 0.44 mg/dL). Recent Labs  Lab 01/13/23 0828 01/13/23 0955 01/14/23 0027 01/14/23 0554  WBC 21.6*  --   --  13.4*  LATICACIDVEN 2.9* 2.5* 2.9*  --       Liver Function Tests: Recent Labs  Lab 01/13/23 0828 01/14/23 0554  AST 23 168*  ALT 18 32  ALKPHOS 104 75  BILITOT 2.3* 0.9  PROT 6.3* 4.5*  ALBUMIN 2.8* 1.9*    No results for input(s): "LIPASE", "AMYLASE" in the last 168 hours. No results for input(s): "AMMONIA" in the last 168 hours.  ABG    Component Value Date/Time   PHART 7.5 (H) 01/13/2023 2029   PCO2ART 35 01/13/2023 2029   PO2ART 127 (H) 01/13/2023 2029   HCO3 27.3 01/13/2023 2029   TCO2 22 08/08/2022 0434   ACIDBASEDEF 23.3 (H) 01/13/2023 0955   O2SAT 99.2 01/13/2023 2029     Coagulation Profile: Recent Labs  Lab 01/13/23 0828 01/14/23 0554  INR 1.3* 1.1     Cardiac Enzymes: No results for input(s): "CKTOTAL", "CKMB", "CKMBINDEX", "TROPONINI" in the last 168 hours.  HbA1C: Hgb A1c MFr Bld  Date/Time Value Ref Range Status  01/13/2023 01:59 PM 13.3 (H) 4.8 - 5.6 % Final    Comment:    (NOTE)         Prediabetes: 5.7 - 6.4         Diabetes: >6.4         Glycemic control for adults with diabetes: <7.0   08/05/2022 04:00 AM 12.9 (H) 4.8 - 5.6 % Final    Comment:    (NOTE)         Prediabetes: 5.7 - 6.4         Diabetes: >6.4         Glycemic control for adults with diabetes: <7.0     CBG: Recent Labs  Lab 01/14/23 1608 01/14/23 1947 01/14/23 2352 01/14/23 2353 01/15/23 0355  GLUCAP 223* 134* 46* 53* 99      Past Medical History:  She,  has a past medical history of (HFpEF) heart failure with preserved ejection fraction (HCC), Angina pectoris (HCC), Anxiety, Aortic atherosclerosis (HCC), Basal cell carcinoma, Bilateral carotid artery disease (HCC), Bipolar disorder (HCC), Cervicalgia, Chronic mesenteric ischemia (HCC), COPD (chronic obstructive pulmonary disease) (HCC), Coronary artery disease (03/20/2014), DDD (degenerative disc disease), cervical, Depression,  Diabetic polyneuropathy (HCC), GERD (gastroesophageal reflux disease), Hyperlipidemia, Hypertension, Long term current  use of antithrombotics/antiplatelets, Memory loss, NSTEMI (non-ST elevated myocardial infarction) (HCC) (07/2016), PAD (peripheral artery disease) (HCC), Respiratory failure with hypoxia (HCC), 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   Surgical History:   Past Surgical History:  Procedure Laterality Date   AMPUTATION Left 08/14/2022   Procedure: ABOVE KNEE AMPUTATION;  Surgeon: Maeola Harman, MD;  [facility];  Service: Vascular;  Laterality: Left;   AORTIC VALVE REPLACEMENT  09/30/2016   Procedure: TRANSCATHETER AORTIC VALVE REPAIR   APPLICATION OF WOUND VAC Bilateral 08/14/2022   Procedure: APPLICATION OF WOUND VAC;  Surgeon: Maeola Harman, MD;  Location[facility] OR;  Service: Vascular;  Laterality: Bilateral;   BLADDER SURGERY     CATARACT EXTRACTION, BILATERAL     CHOLECYSTECTOMY     COLONOSCOPY     CORONARY ANGIOPLASTY WITH STENT PLACEMENT  03/20/2014   Procedure: CORONARY ANGIOPLASTY WITH STENT PLACEMENT; Location: [facility]; Surgeon: Willene Hatchet, MD   CORONARY ANGIOPLASTY WITH STENT PLACEMENT Left 01/25/2016   Procedure: CORONARY ANGIOPLASTY WITH STENT PLACEMENT; Location: [facility]; Surgeon: Willene Hatchet, MD   ENDARTERECTOMY FEMORAL Bilateral 08/04/2022   Procedure: BILATERAL FEMORAL ENDARTERECTOMY;  Surgeon: Maeola Harman, MD;  Location: [facility] OR;  Service: Vascular;  Laterality: Bilateral;   FASCIECTOMY Bilateral 08/04/2022   Procedure: BILATERAL LOWER EXTREMITY FASCIECTOMIES;  Surgeon: Maeola Harman, MD;  Location: [facility] OR;  Service: Vascular;  Laterality: Bilateral;   FOOT SURGERY Right    GROIN DEBRIDEMENT Bilateral 08/14/2022   Procedure: GROIN DEBRIDEMENT;  Surgeon: Maeola Harman, MD;  Location: [facility] OR;  Service: Vascular;  Laterality: Bilateral;   GROIN DEBRIDEMENT Bilateral 09/16/2022    Procedure: GROIN DEBRIDEMENT POSSIBLE VAC PLACMENT;  Surgeon: Victorino Sparrow, MD;  Location: [facility] OR;  Service: Vascular;  Laterality: Bilateral;   HEMORRHOID SURGERY     INSERTION OF ILIAC STENT Bilateral 08/04/2022   Procedure: INSERTION OF BILATERAL ILIAC STENTS;  Surgeon: Maeola Harman, MD;  Location: [facility] OR;  Service: Vascular;  Laterality: Bilateral;   IRRIGATION AND DEBRIDEMENT HEMATOMA Left 07/08/2014   GROIN   LOWER EXTREMITY ANGIOGRAM Bilateral 08/04/2022   Procedure: LOWER EXTREMITY ANGIOGRAM;  Surgeon: Maeola Harman, MD;  Location: [facility] OR;  Service: Vascular;  Laterality: Bilateral;   LOWER EXTREMITY ANGIOGRAPHY Right 04/12/2021   Procedure: Lower Extremity Angiography;  Surgeon: Renford Dills, MD;  Location: [facility] INVASIVE CV LAB;  Service: Cardiovascular;  Laterality: Right;   LOWER EXTREMITY ANGIOGRAPHY Left 05/28/2021   Procedure: LOWER EXTREMITY ANGIOGRAPHY;  Surgeon: Renford Dills, MD;  Location: [facility] INVASIVE CV LAB;  Service: Cardiovascular;  Laterality: Left;   LOWER EXTREMITY ANGIOGRAPHY Left 03/25/2022   Procedure: Lower Extremity Angiography;  Surgeon: Renford Dills, MD;  Location: [facility] INVASIVE CV LAB;  Service: Cardiovascular;  Laterality: Left;   PATCH ANGIOPLASTY  08/04/2022   Procedure: LEFT FEMORAL PATCH ANGIOPLASTY USING XENOSURE BIOLOGIC PATCH;  Surgeon: Maeola Harman, MD;  Location: [facility] OR;  Service: Vascular;;   PSEUDOANEURYSM REPAIR Left 06/30/2014   FEMORAL ARTERY   RIGHT/LEFT HEART CATH AND CORONARY ANGIOGRAPHY Bilateral 09/29/2016   Procedure: RIGHT/LEFT HEART CATH AND CORONARY ANGIOGRAPHY; Location [facility]; Surgeon: Willene Hatchet, MD   THROMBECTOMY FEMORAL ARTERY Bilateral 08/04/2022   Procedure: BILATERAL LOWER EXTREMITY THROMBECTOMY;  Surgeon: Maeola Harman, MD;  Location: [facility] OR;  Service: Vascular;  Laterality: Bilateral;   TUBAL  LIGATION     VISCERAL ANGIOGRAPHY N/A 11/13/2020   Procedure:  VISCERAL ANGIOGRAPHY;  Surgeon: Renford Dills, MD;  Location: [facility] INVASIVE CV LAB;  Service: Cardiovascular;  Laterality: N/A;     Social History:   reports that she has been smoking cigarettes. She has a 72.00 pack-year smoking history. She has never used smokeless tobacco. She reports that she does not currently use alcohol. She reports that she does not currently use drugs.   Family History:  Her family history includes Mental illness in her mother. There is no history of Breast cancer.   Allergies Allergies  Allergen Reactions   Iodinated Contrast Media Hives   Tramadol Hcl Other (See Comments)    SEIZURE   Sulfa Antibiotics Hives   Shellfish Allergy Nausea And Vomiting and Rash    Scallops specifically      Home Medications  Prior to Admission medications   Medication Sig Start Date End Date Taking? Authorizing Provider  acetaminophen (TYLENOL) 325 MG tablet Take 2 tablets (650 mg total) by mouth every 6 (six) hours as needed for mild pain (or Fever >/= 101). 09/29/22  Yes Leroy Sea, MD  albuterol (VENTOLIN HFA) 108 (90 Base) MCG/ACT inhaler Inhale 2 puffs into the lungs every 4 (four) hours as needed for wheezing or shortness of breath.   Yes [provider]  amLODipine (NORVASC) 10 MG tablet Take 1 tablet (10 mg total) by mouth daily. 09/29/22 09/29/23 Yes Leroy Sea, MD  atorvastatin (LIPITOR) 80 MG tablet Take 80 mg by mouth at bedtime.   Yes [provider]  carvedilol (COREG) 6.25 MG tablet Take 1 tablet (6.25 mg total) by mouth 2 (two) times daily with a meal. 09/22/22 01/13/23 Yes Zigmund Daniel., MD  cetirizine (ZYRTEC) 10 MG tablet Take 10 mg by mouth daily.   Yes [provider]  cholecalciferol (VITAMIN D3) 25 MCG (1000 UNIT) tablet Take 1,000 Units by mouth daily.   Yes [provider]  ciprofloxacin (CIPRO) 500 MG tablet Take 500 mg by mouth 2 (two) times daily.   Yes [provider]  clopidogrel  (PLAVIX) 75 MG tablet Take 1 tablet (75 mg total) by mouth daily. 11/15/20  Yes Regalado, Belkys A, MD  diclofenac Sodium (VOLTAREN) 1 % GEL Apply 2 g topically 4 (four) times daily as needed. 10/31/22  Yes Enedina Finner, MD  escitalopram (LEXAPRO) 10 MG tablet Take 1 tablet (10 mg total) by mouth daily. 09/22/22 01/13/23 Yes Zigmund Daniel., MD  hydrALAZINE (APRESOLINE) 50 MG tablet Take 1 tablet (50 mg total) by mouth 3 (three) times daily. 09/29/22 01/13/23 Yes Leroy Sea, MD  insulin aspart (NOVOLOG) 100 UNIT/ML FlexPen Before each meal 3 times a day, 140-199 - 2 units, 200-250 - 4 units, 251-299 - 8 units,  300-349 - 10 units,  350 or above 12 units. I 09/29/22  Yes Leroy Sea, MD  insulin aspart (NOVOLOG) 100 UNIT/ML injection Inject 3 Units into the skin 3 (three) times daily with meals. Hold for NPO or consuming less than 50% of meals Patient taking differently: Inject 3 Units into the skin See admin instructions. Inject 3 units subcutaneously with meals. HOLD for NPO or consuming < 50% of meals. 09/22/22  Yes Zigmund Daniel., MD  insulin glargine (LANTUS) 100 UNIT/ML injection Inject 0.1 mLs (10 Units total) into the skin 2 (two) times daily. 09/29/22  Yes Leroy Sea, MD  levETIRAcetam (KEPPRA) 500 MG tablet Take  1 tablet (500 mg total) by mouth 2 (two) times daily. 10/06/22  Yes Marinda Elk, MD  mirtazapine (REMERON) 7.5 MG tablet Take 1 tablet (7.5 mg total) by mouth at bedtime. 09/22/22 01/13/23 Yes Zigmund Daniel., MD  Multiple Vitamins-Minerals (MULTIVITAMIN WITH MINERALS) tablet Take 1 tablet by mouth daily.   Yes [provider]  nicotine (NICODERM CQ - DOSED IN MG/24 HOURS) 14 mg/24hr patch Place 1 patch (14 mg total) onto the skin daily. 10/31/22  Yes Enedina Finner, MD  pantoprazole (PROTONIX) 40 MG tablet Take 1 tablet (40 mg total) by mouth daily. 09/22/22 01/13/23 Yes Zigmund Daniel., MD  polyethylene glycol (MIRALAX / GLYCOLAX) 17 g  packet Take 17 g by mouth daily. Titrate as needed for constipation. Patient taking differently: Take 17 g by mouth daily. 09/22/22  Yes Zigmund Daniel., MD  senna-docusate (SENOKOT-S) 8.6-50 MG tablet Take 1 tablet by mouth daily. 09/30/22  Yes Leroy Sea, MD  traZODone (DESYREL) 50 MG tablet Take 0.5 tablets (25 mg total) by mouth at bedtime as needed for sleep. 09/22/22 01/13/23 Yes Zigmund Daniel., MD          PATIENT WITH VERY POOR PROGNOSIS PATIENT IN THE DYING PROCESS I ANTICIPATE PROLONGED ICU LOS     DVT/GI PRX  assessed I Assessed the need for Labs I Assessed the need for Foley I Assessed the need for Central Venous Line Family Discussion when available I Assessed the need for Mobilization I made an Assessment of medications to be adjusted accordingly Safety Risk assessment completed  CASE DISCUSSED IN MULTIDISCIPLINARY ROUNDS WITH ICU TEAM     Critical Care Time devoted to patient care services described in this note is 52 minutes.  Critical care was necessary to treat /prevent imminent and life-threatening deterioration. Overall, patient is critically ill, prognosis is guarded.  Patient with Multiorgan failure and at high risk for cardiac arrest and death.    Lucie Leather, M.D.  [facility]  Medical Director [facility] Medical Director [facility] Cardio-Pulmonary Department

## 2023-01-15 NOTE — Inpatient Diabetes Management (Addendum)
Inpatient Diabetes Program Recommendations  AACE/ADA: New Consensus Statement on Inpatient Glycemic Control (2015)  Target Ranges:  Prepandial:   less than 140 mg/dL      Peak postprandial:   less than 180 mg/dL (1-2 hours)      Critically ill patients:  140 - 180 mg/dL    Latest Reference Range & Units 01/14/23 02:59 01/14/23 03:56 01/14/23 07:48 01/14/23 11:59 01/14/23 15:26 01/14/23 16:08 01/14/23 19:47  Glucose-Capillary 70 - 99 mg/dL 578 (H)    10 units Semglee @0207  175 (H)  4 units Novolog  144 (H)  3 units Novolog  10 units Semglee @0949  82 65 (L)  50 ml D50% 223 (H) 134 (H)  3 units Novolog   (H): Data is abnormally high (L): Data is abnormally low  Latest Reference Range & Units 01/14/23 23:52 01/14/23 23:53 01/15/23 03:55  Glucose-Capillary 70 - 99 mg/dL 46 (L) 53 (L)  50 ml I69% 99  (L): Data is abnormally low     Home DM Meds: Lantus 10 units bid     Novolog 3 units tid with meals     Novolog 2-12 units tid with meals    Current Orders: Semglee 10 units daily     Novolog Resistant Correction Scale/ SSI (0-20 units) Q4 hours     MD- Note Hypoglycemia at 3pm yesterday and again at Midnight this AM  May consider:  1. Reduce Semglee to 8 units Daily  2. Reduce the Novolog SSI to the 0-9 unit (Sensitive) scale     --Will follow patient during hospitalization--  Ambrose Finland RN, MSN, CDCES Diabetes Coordinator Inpatient Glycemic Control Team Team Pager: [phone] (8a-5p)

## 2023-01-16 DIAGNOSIS — I70222 Atherosclerosis of native arteries of extremities with rest pain, left leg: Secondary | ICD-10-CM

## 2023-01-16 DIAGNOSIS — G9340 Encephalopathy, unspecified: Secondary | ICD-10-CM | POA: Diagnosis not present

## 2023-01-16 DIAGNOSIS — N179 Acute kidney failure, unspecified: Secondary | ICD-10-CM | POA: Diagnosis not present

## 2023-01-16 LAB — PHOSPHORUS
Phosphorus: 2.4 mg/dL — ABNORMAL LOW (ref 2.5–4.6)
Phosphorus: 2.6 mg/dL (ref 2.5–4.6)

## 2023-01-16 LAB — GLUCOSE, CAPILLARY
Glucose-Capillary: 60 mg/dL — ABNORMAL LOW (ref 70–99)
Glucose-Capillary: 110 mg/dL — ABNORMAL HIGH (ref 70–99)
Glucose-Capillary: 101 mg/dL — ABNORMAL HIGH (ref 70–99)
Glucose-Capillary: 147 mg/dL — ABNORMAL HIGH (ref 70–99)
Glucose-Capillary: 156 mg/dL — ABNORMAL HIGH (ref 70–99)
Glucose-Capillary: 125 mg/dL — ABNORMAL HIGH (ref 70–99)

## 2023-01-16 LAB — BASIC METABOLIC PANEL
Anion gap: 5 (ref 5–15)
BUN: 9 mg/dL (ref 8–23)
CO2: 21 mmol/L — ABNORMAL LOW (ref 22–32)
Calcium: 7.3 mg/dL — ABNORMAL LOW (ref 8.9–10.3)
Chloride: 106 mmol/L (ref 98–111)
Creatinine, Ser: 0.53 mg/dL (ref 0.44–1.00)
GFR, Estimated: >60 mL/min (ref >60)
Glucose, Bld: 130 mg/dL — ABNORMAL HIGH (ref 70–99)
Potassium: 4.5 mmol/L (ref 3.5–5.1)
Sodium: 132 mmol/L — ABNORMAL LOW (ref 135–145)

## 2023-01-16 LAB — CBC
HCT: 33.4 % — ABNORMAL LOW (ref 36.0–46.0)
Hemoglobin: 10.9 g/dL — ABNORMAL LOW (ref 12.0–15.0)
MCH: 27.8 pg (ref 26.0–34.0)
MCHC: 32.6 g/dL (ref 30.0–36.0)
MCV: 85.2 fL (ref 80.0–100.0)
Platelets: 160 10*3/uL (ref 150–400)
RBC: 3.92 MIL/uL (ref 3.87–5.11)
RDW: 15.6 % — ABNORMAL HIGH (ref 11.5–15.5)
WBC: 13.5 10*3/uL — ABNORMAL HIGH (ref 4.0–10.5)
nRBC: 0 % (ref 0.0–0.2)

## 2023-01-16 LAB — HEPARIN LEVEL (UNFRACTIONATED): Heparin Unfractionated: 0.35 IU/mL (ref 0.30–0.70)

## 2023-01-16 LAB — MAGNESIUM: Magnesium: 1.9 mg/dL (ref 1.7–2.4)

## 2023-01-16 LAB — CULTURE, BLOOD (ROUTINE X 2): Culture: NO GROWTH

## 2023-01-16 MED ORDER — VITAL HIGH PROTEIN PO LIQD: 1000.0000 mL | ORAL | Status: DC

## 2023-01-16 MED ORDER — FREE WATER
30.0000 mL | Status: DC
  Administered 2023-01-16 – 2023-01-18 (×13): 30 mL

## 2023-01-16 MED ORDER — VITAL 1.5 CAL PO LIQD
1000.0000 mL | ORAL | Status: DC
  Administered 2023-01-16: 1000 mL

## 2023-01-16 MED ORDER — PROSOURCE TF20 ENFIT COMPATIBL EN LIQD
60.0000 mL | Freq: Every day | ENTERAL | Status: DC
  Administered 2023-01-16 – 2023-01-18 (×3): 60 mL
  Filled 2023-01-16: qty 60

## 2023-01-16 NOTE — Inpatient Diabetes Management (Signed)
Inpatient Diabetes Program Recommendations  AACE/ADA: New Consensus Statement on Inpatient Glycemic Control (2015)  Target Ranges:  Prepandial:   less than 140 mg/dL      Peak postprandial:   less than 180 mg/dL (1-2 hours)      Critically ill patients:  140 - 180 mg/dL    Latest Reference Range & Units 01/14/23 23:52 01/14/23 23:53 01/15/23 03:55 01/15/23 08:18 01/15/23 12:29 01/15/23 16:12 01/15/23 19:45  Glucose-Capillary 70 - 99 mg/dL 46 (L) 53 (L) 99 74  8 units Semglee 100 (H) 76 72  (L): Data is abnormally low (H): Data is abnormally high   Latest Reference Range & Units 01/15/23 23:20 01/16/23 03:35  Glucose-Capillary 70 - 99 mg/dL 79 60 (L)  (L): Data is abnormally low  Home DM Meds: Lantus 10 units bid     Novolog 3 units tid with meals     Novolog 2-12 units tid with meals      Current Orders: Semglee 8 units daily                           Novolog 0-9 units Q4 hours         MD- Note Hypoglycemia at MN yesterday and again at 3am today.  Semglee and Novolog doses were both reduced.   Please consider:   Reduce Semglee to 5 units Daily           --Will follow patient during hospitalization--   Ambrose Finland RN, MSN, CDCES Diabetes Coordinator Inpatient Glycemic Control Team Team Pager: [phone] (8a-5p)

## 2023-01-16 NOTE — Plan of Care (Signed)
Continuing with plan of care. 

## 2023-01-16 NOTE — Consult Note (Signed)
ANTICOAGULATION CONSULT NOTE  Pharmacy Consult for Heparin Infusion Indication: arterial clot  Patient Measurements: Height: 5' (152.4 cm) Weight: 45.6 kg (100 lb 8.5 oz) IBW/kg (Calculated) : 45.5 Heparin Dosing Weight: 45.6 kg  Labs: Recent Labs    01/02/2023 0828 12/26/2022 1001 01/08/2023 1359 01/06/2023 1603 12/27/2022 2130 01/14/23 0554 01/14/23 1440 01/15/23 0414 01/15/23 0657 01/15/23 1236 01/15/23 1844  HGB 14.4  --   --   --   --  11.9*  --   --   --   --   --   HCT 46.9*  --   --   --   --  34.7*  --   --   --   --   --   PLT 337  --   --   --   --  207  --   --   --   --   --   APTT 27  --   --   --   --   --   --   --   --   --   --   LABPROT 16.1*  --   --   --   --  14.4  --   --   --   --   --   INR 1.3*  --   --   --   --  1.1  --   --   --   --   --   HEPARINUNFRC  --    < > >1.10*  --    < >  --   --  0.27*  --  0.40 0.49  CREATININE 1.07*  --  0.86 0.71   < > 0.42* 0.43*  --  0.44  --   --   TROPONINIHS  --   --  1,356* 2,875*  --   --   --   --   --   --   --    < > = values in this interval not displayed.    Estimated Creatinine Clearance: 47.7 mL/min (by C-G formula based on SCr of 0.44 mg/dL).  Medical History: Past Medical History:  Diagnosis Date   (HFpEF) heart failure with preserved ejection fraction (HCC)    Angina pectoris (HCC)    Anxiety    Aortic atherosclerosis (HCC)    Basal cell carcinoma    Bilateral carotid artery disease (HCC)    a.) carotid doppler 03/18/2021: 1-39% RICA, 40-59% LICA   Bipolar disorder (HCC)    Cervicalgia    Chronic mesenteric ischemia (HCC)    a.) s/p PTA 11/13/2020 --> 6 x 16 mm lifestream ballon expanding stent to SMA   COPD (chronic obstructive pulmonary disease) (HCC)    Coronary artery disease 03/20/2014   a.) LHC/PCI 03/20/2014: 70% mLAD (2.5 x 28 mm Xience Alpine DES), 70% dLCx, 70% dRCA; b.) LHC 01/25/2016: 90% ISR mLAD --> mLAD dissection with in stent --> 2.75 x 22 mm Resolute DES; c.) R/LHC 09/29/2016:  50% ISR mLAD, 70% dLAD, mPA 37, PCWP 24, AO sat 90%, PA sat 46%, CO 3.7, CI 2.46 - med mgmt   DDD (degenerative disc disease), cervical    Depression    Diabetic polyneuropathy (HCC)    GERD (gastroesophageal reflux disease)    Hyperlipidemia    Hypertension    Long term current use of antithrombotics/antiplatelets    a.) on DAPT (ASA + clopidogrel)   Memory loss    NSTEMI (non-ST elevated myocardial infarction) (HCC) 07/2016  a.) troponins trended (normal < 0.034 ng/mL): 0.325 --> 1.480 --> 2.330 --> 1.660 --> 0.169 ng/mL   PAD (peripheral artery disease) (HCC)    a.) s/p PTA and stenting SMA 11/13/2020; b.) s/p PTA and stenting of RIGHT SFA and popliteal 04/12/2021; c.) s/p PTA and kissing balloon stents to BILATERAL CIAs 05/28/2021   Respiratory failure with hypoxia (HCC)    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  Assessment: 72 yo female presented by EMS to the ED due to weakness and confusion.  Pharmacy consulted to initiate heparin infusion for arterial clot.   Goal of Therapy:  Heparin level 0.3-0.7 units/ml Monitor platelets by anticoagulation protocol: Yes   Plan: heparin level remains therapeutic  ---continue heparin infusion at 650 units/hr. ---recheck heparin level once daily: next 05/25 am   ----Daily CBC per protocol while on IV heparin  Lowella Bandy 01/16/2023,7:08 AM

## 2023-01-16 NOTE — Progress Notes (Signed)
NAME:  Heather [name], MRN:  [id number], DOB:  1951-08-21, LOS: 3 ADMISSION DATE:  01/19/2023 CHIEF COMPLAINT:  resp distress   History of Present Illness:  72 y.o. female with medical history significant of HFpEF, CAD s/p DES to LAD (2015), rheumatic heart disease s/p TAVR, T2DM, HTN, HLD, PAD s/p multiple stents and left AKA, COPD, who presents to the ED with c/o altered mental status.    Patient with severe metabolic encephalopathy patient's husband, there has been a slow decline over the last few weeks, however patient became suddenly altered yesterday evening and began to speak without sense  her right lower extremity has become paler over the last several weeks and he has not seen her move it recently but moves it for her intermittently.   ED course: On arrival to the ED, patient was hypertensive at 129/92 with heart rate of 149.  She was saturating at 100% on room air.  She was afebrile at 97.6.  Initial workup with VBG demonstrating pH of 7.14, pCO2 less than 18, glucose of 605, bicarb less than 7, potassium 3.2, creatinine 1.07 with GFR 56, albumin 2.8, WBC 21.6, lactic acid 2.9, INR 1.3, and urinalysis with ketonuria.  CT of the head with no acute intracranial abnormalities.  Chest x-ray with concerns for pneumonia.  Patient started on IV antibiotics for pneumonia.  Patient started on IV insulin for DKA.  Vascular surgery consulted for ischemic limb; patient started on IV heparin.  TRH contacted for admission, PCCM asked to Consult   Significant Hospital Events: Including procedures, antibiotic start and stop dates in addition to other pertinent events   5/21 ICU admission for acidosis and encephalopathy 5/22 remains on vent, remains critically ill, DNR status per family request 5/23 remains on vent, remains critically ill     Micro Data:  CULTURES PENDING  Antimicrobials:   Antibiotics Given (last 72 hours)     Date/Time Action Medication Dose Rate   01/03/2023 1014 New  Bag/Given   ceFEPIme (MAXIPIME) 2 g in sodium chloride 0.9 % 100 mL IVPB 2 g 200 mL/hr   01/14/2023 1101 New Bag/Given   vancomycin (VANCOCIN) IVPB 1000 mg/200 mL premix 1,000 mg 200 mL/hr   12/26/2022 1414 New Bag/Given   azithromycin (ZITHROMAX) 500 mg in sodium chloride 0.9 % 250 mL IVPB 500 mg 250 mL/hr   01/18/2023 1952 New Bag/Given   piperacillin-tazobactam (ZOSYN) IVPB 3.375 g 3.375 g 12.5 mL/hr   01/14/23 0645 New Bag/Given   piperacillin-tazobactam (ZOSYN) IVPB 3.375 g 3.375 g 12.5 mL/hr   01/14/23 1219 New Bag/Given   vancomycin (VANCOREADY) IVPB 750 mg/150 mL 750 mg 150 mL/hr   01/14/23 1346 New Bag/Given   piperacillin-tazobactam (ZOSYN) IVPB 3.375 g 3.375 g 12.5 mL/hr   01/14/23 2138 New Bag/Given   piperacillin-tazobactam (ZOSYN) IVPB 3.375 g 3.375 g 12.5 mL/hr   01/15/23 4098 New Bag/Given   piperacillin-tazobactam (ZOSYN) IVPB 3.375 g 3.375 g 12.5 mL/hr   01/15/23 1224 New Bag/Given   vancomycin (VANCOREADY) IVPB 750 mg/150 mL 750 mg 150 mL/hr   01/15/23 1436 New Bag/Given   piperacillin-tazobactam (ZOSYN) IVPB 3.375 g 3.375 g 12.5 mL/hr   01/15/23 2135 New Bag/Given   piperacillin-tazobactam (ZOSYN) IVPB 3.375 g 3.375 g 12.5 mL/hr   01/16/23 0616 New Bag/Given   piperacillin-tazobactam (ZOSYN) IVPB 3.375 g 3.375 g 12.5 mL/hr            Interim History / Subjective:  Multiple admission in the past Cyanosis from waist down Severe ABD  pain Critical limb ischemia Remains Mottled from waist down Cyanosis of RT leg, Cyanosis of left Stump(s/p L AKA) Severe resp failure, failed SBT yesterday Remains on vent Remains critically ill Prognosis is grave   Vent Mode: PRVC FiO2 (%):  [30 %] 30 % Set Rate:  [20 bmp] 20 bmp Vt Set:  [450 mL] 450 mL PEEP:  [5 cmH20] 5 cmH20 Plateau Pressure:  [16 cmH20] 16 cmH20     Objective   Blood pressure 125/81, pulse 84, temperature (!) 100.4 F (38 C), resp. rate 18, height 5' (1.524 m), weight 45.6 kg, SpO2 (!) 85 %.     Vent Mode: PRVC FiO2 (%):  [30 %] 30 % Set Rate:  [20 bmp] 20 bmp Vt Set:  [450 mL] 450 mL PEEP:  [5 cmH20] 5 cmH20 Plateau Pressure:  [16 cmH20] 16 cmH20   Intake/Output Summary (Last 24 hours) at 01/16/2023 0715 Last data filed at 01/16/2023 4098 Gross per 24 hour  Intake 3499.16 ml  Output 1550 ml  Net 1949.16 ml    Filed Weights   12/24/2022 0900 12/26/2022 0926  Weight: 45.6 kg 45.6 kg      REVIEW OF SYSTEMS  PATIENT IS UNABLE TO PROVIDE COMPLETE REVIEW OF SYSTEMS DUE TO SEVERE CRITICAL ILLNESS   PHYSICAL EXAMINATION:  GENERAL:critically ill appearing, +resp distress EYES: Pupils equal, round, reactive to light.  No scleral icterus.  MOUTH: Moist mucosal membrane. INTUBATED NECK: Supple.  PULMONARY: Lungs clear to auscultation, +rhonchi, +wheezing CARDIOVASCULAR: S1 and S2.  Regular rate and rhythm GASTROINTESTINAL: Soft, nontender, -distended. Positive bowel sounds.  MUSCULOSKELETAL:+cyanosis, mottled skin, left AKA NEUROLOGIC: obtunded,sedated SKIN:abnormal, cold to touch, Capillary refill delayed  Weak pulses RT foot  Labs/imaging that I havepersonally reviewed  (right click and "Reselect all SmartList Selections" daily)      ASSESSMENT AND PLAN SYNOPSIS  72 yo white female with multiple medical issues with severe metabolic acidosis due to severe DKA with severe metabolic encephalopathy with acute cyanosis of lower half of her body mottled skin with critical limb ischemia active smoker, possible aspiration pneumonia  Severe ACUTE Hypoxic and Hypercapnic Respiratory Failure -continue Mechanical Ventilator support -Wean Fio2 and PEEP as tolerated -VAP/VENT bundle implementation - Wean PEEP & FiO2 as tolerated, maintain SpO2 > 88% - Head of bed elevated 30 degrees, VAP protocol in place - Plateau pressures less than 30 cm H20  - Intermittent chest x-ray & ABG PRN - Ensure adequate pulmonary hygiene  -will perform SAT/SBT when respiratory parameters are  met   HIGH RISK FOR CARDIAC ARREST  CARDIAC ICU monitoring  RENAL SEVERE ACIDOSIS CONTINUE BICARB INFUSION -continue Foley Catheter-assess need -Avoid nephrotoxic agents -Follow urine output, BMP -Ensure adequate renal perfusion, optimize oxygenation -Renal dose medications   Intake/Output Summary (Last 24 hours) at 01/16/2023 0716 Last data filed at 01/16/2023 1191 Gross per 24 hour  Intake 3499.16 ml  Output 1550 ml  Net 1949.16 ml     NEUROLOGY ACUTE METABOLIC ENCEPHALOPATHY WUA pending   SEPTIC shock SOURCE-LIMB ISCHEMIA, PNEUMONIA -use vasopressors to keep MAP>65 as needed -follow ABG and LA as needed -follow up cultures -emperic ABX  INFECTIOUS DISEASE -continue antibiotics as prescribed -follow up cultures   ENDO - ICU hypoglycemic\Hyperglycemia protocol -check FSBS per protocol   GI GI PROPHYLAXIS as indicated  NUTRITIONAL STATUS DIET-->TF's as tolerated Constipation protocol as indicated   ELECTROLYTES -follow labs as needed -replace as needed -pharmacy consultation and following   ACUTE ANEMIA- TRANSFUSE AS NEEDED CONSIDER TRANSFUSION  IF HGB<7  Best practice (right click and "Reselect all SmartList Selections" daily)  Diet: NPO Central venous access:  N/A Arterial line:  N/A Foley:  N/A Mobility:  bed rest  Code Status: DNR/DNI Disposition:ICU  Labs   CBC: Recent Labs  Lab 12/29/2022 0828 01/14/23 0554  WBC 21.6* 13.4*  NEUTROABS 17.7*  --   HGB 14.4 11.9*  HCT 46.9* 34.7*  MCV 90.2 81.8  PLT 337 207     Basic Metabolic Panel: Recent Labs  Lab 01/17/2023 1359 01/17/2023 1603 01/03/2023 2130 01/14/23 0027 01/14/23 0554 01/14/23 1440 01/15/23 0657  NA 138   < > 138 139 135 134* 132*  K 4.3   < > 3.0* 2.7* 3.9 3.5 3.9  CL 113*   < > 100 101 98 101 103  CO2 <7*   < > 26 29 28 27 24   GLUCOSE 377*   < > 265* 247* 150* 70 101*  BUN 19   < > 15 13 11 10 10   CREATININE 0.86   < > 0.55 0.50 0.42* 0.43* 0.44   CALCIUM 7.8*   < > 7.0* 7.1* 6.8* 6.6* 6.9*  MG 1.5*  --   --   --   --   --  1.7  PHOS 3.3  --   --   --   --   --   --    < > = values in this interval not displayed.    GFR: Estimated Creatinine Clearance: 47.7 mL/min (by C-G formula based on SCr of 0.44 mg/dL). Recent Labs  Lab 12/28/2022 0828 12/30/2022 0955 01/14/23 0027 01/14/23 0554  WBC 21.6*  --   --  13.4*  LATICACIDVEN 2.9* 2.5* 2.9*  --      Liver Function Tests: Recent Labs  Lab 01/11/2023 0828 01/14/23 0554  AST 23 168*  ALT 18 32  ALKPHOS 104 75  BILITOT 2.3* 0.9  PROT 6.3* 4.5*  ALBUMIN 2.8* 1.9*    No results for input(s): "LIPASE", "AMYLASE" in the last 168 hours. No results for input(s): "AMMONIA" in the last 168 hours.  ABG    Component Value Date/Time   PHART 7.5 (H) 01/10/2023 2029   PCO2ART 35 12/28/2022 2029   PO2ART 127 (H) 12/27/2022 2029   HCO3 27.3 01/12/2023 2029   TCO2 22 08/08/2022 0434   ACIDBASEDEF 23.3 (H) 12/28/2022 0955   O2SAT 99.2 01/03/2023 2029     Coagulation Profile: Recent Labs  Lab 01/02/2023 0828 01/14/23 0554  INR 1.3* 1.1     Cardiac Enzymes: No results for input(s): "CKTOTAL", "CKMB", "CKMBINDEX", "TROPONINI" in the last 168 hours.  HbA1C: Hgb A1c MFr Bld  Date/Time Value Ref Range Status  12/24/2022 01:59 PM 13.3 (H) 4.8 - 5.6 % Final    Comment:    (NOTE)         Prediabetes: 5.7 - 6.4         Diabetes: >6.4         Glycemic control for adults with diabetes: <7.0   08/05/2022 04:00 AM 12.9 (H) 4.8 - 5.6 % Final    Comment:    (NOTE)         Prediabetes: 5.7 - 6.4         Diabetes: >6.4         Glycemic control for adults with diabetes: <7.0     CBG: Recent Labs  Lab 01/15/23 1229 01/15/23 1612 01/15/23 1945 01/15/23 2320 01/16/23 0335  GLUCAP 100* 76 72 79 60*  Home Medications  Prior to Admission medications   Medication Sig Start Date End Date Taking? Authorizing Provider  acetaminophen (TYLENOL) 325 MG tablet Take 2 tablets  (650 mg total) by mouth every 6 (six) hours as needed for mild pain (or Fever >/= 101). 09/29/22  Yes Leroy Sea, MD  albuterol (VENTOLIN HFA) 108 (90 Base) MCG/ACT inhaler Inhale 2 puffs into the lungs every 4 (four) hours as needed for wheezing or shortness of breath.   Yes [provider]  amLODipine (NORVASC) 10 MG tablet Take 1 tablet (10 mg total) by mouth daily. 09/29/22 09/29/23 Yes Leroy Sea, MD  atorvastatin (LIPITOR) 80 MG tablet Take 80 mg by mouth at bedtime.   Yes [provider]  carvedilol (COREG) 6.25 MG tablet Take 1 tablet (6.25 mg total) by mouth 2 (two) times daily with a meal. 09/22/22 01/17/2023 Yes Zigmund Daniel., MD  cetirizine (ZYRTEC) 10 MG tablet Take 10 mg by mouth daily.   Yes [provider]  cholecalciferol (VITAMIN D3) 25 MCG (1000 UNIT) tablet Take 1,000 Units by mouth daily.   Yes [provider]  ciprofloxacin (CIPRO) 500 MG tablet Take 500 mg by mouth 2 (two) times daily.   Yes [provider]  clopidogrel (PLAVIX) 75 MG tablet Take 1 tablet (75 mg total) by mouth daily. 11/15/20  Yes Regalado, Belkys A, MD  diclofenac Sodium (VOLTAREN) 1 % GEL Apply 2 g topically 4 (four) times daily as needed. 10/31/22  Yes Enedina Finner, MD  escitalopram (LEXAPRO) 10 MG tablet Take 1 tablet (10 mg total) by mouth daily. 09/22/22 01/10/2023 Yes Zigmund Daniel., MD  hydrALAZINE (APRESOLINE) 50 MG tablet Take 1 tablet (50 mg total) by mouth 3 (three) times daily. 09/29/22 01/02/2023 Yes Leroy Sea, MD  insulin aspart (NOVOLOG) 100 UNIT/ML FlexPen Before each meal 3 times a day, 140-199 - 2 units, 200-250 - 4 units, 251-299 - 8 units,  300-349 - 10 units,  350 or above 12 units. I 09/29/22  Yes Leroy Sea, MD  insulin aspart (NOVOLOG) 100 UNIT/ML injection Inject 3 Units into the skin 3 (three) times daily with meals. Hold for NPO or consuming less than 50% of meals Patient taking differently: Inject 3 Units into the  skin See admin instructions. Inject 3 units subcutaneously with meals. HOLD for NPO or consuming < 50% of meals. 09/22/22  Yes Zigmund Daniel., MD  insulin glargine (LANTUS) 100 UNIT/ML injection Inject 0.1 mLs (10 Units total) into the skin 2 (two) times daily. 09/29/22  Yes Leroy Sea, MD  levETIRAcetam (KEPPRA) 500 MG tablet Take 1 tablet (500 mg total) by mouth 2 (two) times daily. 10/06/22  Yes Marinda Elk, MD  mirtazapine (REMERON) 7.5 MG tablet Take 1 tablet (7.5 mg total) by mouth at bedtime. 09/22/22 01/20/2023 Yes Zigmund Daniel., MD  Multiple Vitamins-Minerals (MULTIVITAMIN WITH MINERALS) tablet Take 1 tablet by mouth daily.   Yes [provider]  nicotine (NICODERM CQ - DOSED IN MG/24 HOURS) 14 mg/24hr patch Place 1 patch (14 mg total) onto the skin daily. 10/31/22  Yes Enedina Finner, MD  pantoprazole (PROTONIX) 40 MG tablet Take 1 tablet (40 mg total) by mouth daily. 09/22/22 01/04/2023 Yes Zigmund Daniel., MD  polyethylene glycol (MIRALAX / GLYCOLAX) 17 g packet Take 17 g by mouth daily. Titrate as needed for constipation. Patient taking differently: Take 17 g by mouth daily. 09/22/22  Yes Skeet Simmer  Montez Hageman., MD  senna-docusate (SENOKOT-S) 8.6-50 MG tablet Take 1 tablet by mouth daily. 09/30/22  Yes Leroy Sea, MD  traZODone (DESYREL) 50 MG tablet Take 0.5 tablets (25 mg total) by mouth at bedtime as needed for sleep. 09/22/22 01/20/2023 Yes Zigmund Daniel., MD          PATIENT WITH VERY POOR PROGNOSIS PATIENT IN THE DYING PROCESS I ANTICIPATE PROLONGED ICU LOS     DVT/GI PRX  assessed I Assessed the need for Labs I Assessed the need for Foley I Assessed the need for Central Venous Line Family Discussion when available I Assessed the need for Mobilization I made an Assessment of medications to be adjusted accordingly Safety Risk assessment completed  CASE DISCUSSED IN MULTIDISCIPLINARY ROUNDS WITH ICU TEAM     Critical Care  Time devoted to patient care services described in this note is 55 minutes.  Critical care was necessary to treat /prevent imminent and life-threatening deterioration. Overall, patient is critically ill, prognosis is guarded.  Patient with Multiorgan failure and at high risk for cardiac arrest and death.    Lucie Leather, M.D.  [facility]  Medical Director [facility] Medical Director [facility] Cardio-Pulmonary Department

## 2023-01-16 NOTE — Progress Notes (Signed)
Pharmacy Antibiotic Note  Heather [name] is a 72 y.o. female w/ PMH of DM, depression, HTN, diabetic polyneuropathy, tobacco use disorder, CAD, chronic pain, COPD, s/p TAVR, HFpEF, carotid stenosis, anxiety, PAD, history of ICH, sacral ulcers, Hx left AKA, seizures admitted on 01/18/2023 with sepsis.  Pharmacy has been consulted for vancomycin and cefepime dosing for limb ischemia/cellulitis.  Plan:   1) continue vancomycin 750 mg every 24 hours Goal AUC 400-550 Estimated AUC 518.5, Cmin 12.7 Ke 0.044 h-1, T1/2 15.7h  2) continue Zosyn 3.375 grams every 8 hours  (4 hour infusion)  Height: 5' (152.4 cm) Weight: 45.6 kg (100 lb 8.5 oz) IBW/kg (Calculated) : 45.5  Temp (24hrs), Avg:100.3 F (37.9 C), Min:99.7 F (37.6 C), Max:100.6 F (38.1 C)  Recent Labs  Lab 01/07/2023 0828 01/14/2023 0955 01/09/2023 1359 01/12/2023 2130 01/14/23 0027 01/14/23 0554 01/14/23 1440 01/15/23 0657  WBC 21.6*  --   --   --   --  13.4*  --   --   CREATININE 1.07*  --    < > 0.55 0.50 0.42* 0.43* 0.44  LATICACIDVEN 2.9* 2.5*  --   --  2.9*  --   --   --    < > = values in this interval not displayed.     Estimated Creatinine Clearance: 47.7 mL/min (by C-G formula based on SCr of 0.44 mg/dL).    Allergies  Allergen Reactions   Iodinated Contrast Media Hives   Tramadol Hcl Other (See Comments)    SEIZURE   Sulfa Antibiotics Hives   Shellfish Allergy Nausea And Vomiting and Rash    Scallops specifically     Antimicrobials this admission: cefepime 5/21 >>  vancomycin 5/21 >>   Microbiology results: 5/21 BCx: NGTD 5/21 MRSA PCR: negative  Thank you for allowing pharmacy to be a part of this patient's care.  Lowella Bandy 01/16/2023 8:24 AM

## 2023-01-16 NOTE — Progress Notes (Addendum)
Nutrition Follow-up  DOCUMENTATION CODES:   Non-severe (moderate) malnutrition in context of chronic illness  INTERVENTION:   -TF via OGT:   Initiate Vital 1.5 @ 20 ml/hr via OGT and increase by 10 ml every 4 hours to goal rate of 40 ml/hr.    60 ml Prosource TF daily  30 free water flush every 4 hours to maintain tube patency   Tube feeding regimen provides 1520 kcal (100% of needs), 85 grams of protein, and 733 ml of H2O.  Total free water: 913 ml daily  -Continue 100 mg thiamine daily  NUTRITION DIAGNOSIS:   Moderate Malnutrition related to chronic illness (COPD, PAD) as evidenced by percent weight loss, mild fat depletion, moderate fat depletion, mild muscle depletion, moderate muscle depletion.  Ongoing  GOAL:   Patient will meet greater than or equal to 90% of their needs  Progressing   MONITOR:   Vent status  REASON FOR ASSESSMENT:   Ventilator    ASSESSMENT:   Pt with medical history significant of HFpEF, CAD s/p DES to LAD (2015), rheumatic heart disease s/p TAVR, T2DM, HTN, HLD, PAD s/p multiple stents and left AKA, COPD, who presents with c/o altered mental status.  Patient is currently intubated on ventilator support. 18 french OGT currently connected to low, intermittent suction; tube position verified by x-ray on 01/07/2023.  MV: 9.1 L/min Temp (24hrs), Avg:100.4 F (38 C), Min:99.7 F (37.6 C), Max:100.6 F (38.1 C)  Reviewed I/O's: +1.9 L x 24 hours and +8.1 since admission  UOP: 1.6 L x 24 hours   Case discussed with RN, MD, and during ICU rounds. Pt with no vascular options; pt has 10 occluded stents and has no blood supply from the waist down.   Pt failed weaning trial. Palliative care has met with the pt; initial plan was for one way extubation, however, family decided against this just prior to attempting extubation. Plan to continue intubation to allow time for outcomes.   Received permission to start feeds today. Per discussion with RN  and pharmacist, should help with fluid status.   No wt since last visit.   Medications reviewed and include colace, folic acid, miralax, keppra, and thiamine.   Labs reviewed: Na: 132, CBGS: 110 (inpatient orders for glycemic control are 0-9 units insulin aspart every 4 hours and 8 units insulin glargine-yfgn daily).    Diet Order:   Diet Order             Diet NPO time specified  Diet effective now                   EDUCATION NEEDS:   No education needs have been identified at this time  Skin:  Skin Assessment: Skin Integrity Issues: Skin Integrity Issues:: Stage II, DTI DTI: buttocks Stage II: sacrum  Last BM:  Unknown  Height:   Ht Readings from Last 1 Encounters:  01/17/2023 5' (1.524 m)    Weight:   Wt Readings from Last 1 Encounters:  12/25/2022 45.6 kg    Ideal Body Weight:  41.9 kg (adjusted for lt AKA)  BMI:  Body mass index is 19.63 kg/m.  Estimated Nutritional Needs:   Kcal:  1304  Protein:  85-100 grams  Fluid:  1.3-1.5 L    Levada Schilling, RD, LDN, CDCES Registered Dietitian II Certified Diabetes Care and Education Specialist Please refer to [facility] for RD and/or RD on-call/weekend/after hours pager

## 2023-01-17 ENCOUNTER — Inpatient Hospital Stay: Payer: Medicare HMO

## 2023-01-17 DIAGNOSIS — Z515 Encounter for palliative care: Secondary | ICD-10-CM

## 2023-01-17 DIAGNOSIS — E874 Mixed disorder of acid-base balance: Secondary | ICD-10-CM | POA: Diagnosis not present

## 2023-01-17 DIAGNOSIS — J439 Emphysema, unspecified: Secondary | ICD-10-CM | POA: Diagnosis not present

## 2023-01-17 DIAGNOSIS — J189 Pneumonia, unspecified organism: Secondary | ICD-10-CM | POA: Diagnosis not present

## 2023-01-17 DIAGNOSIS — I70222 Atherosclerosis of native arteries of extremities with rest pain, left leg: Secondary | ICD-10-CM | POA: Diagnosis not present

## 2023-01-17 LAB — BLOOD CULTURE ID PANEL (REFLEXED) - BCID2
A.calcoaceticus-baumannii: NOT DETECTED
Bacteroides fragilis: NOT DETECTED
Candida albicans: NOT DETECTED
Candida auris: NOT DETECTED
Candida glabrata: NOT DETECTED
Candida krusei: NOT DETECTED
Candida parapsilosis: NOT DETECTED
Candida tropicalis: NOT DETECTED
Cryptococcus neoformans/gattii: NOT DETECTED
Enterobacter cloacae complex: NOT DETECTED
Enterobacterales: NOT DETECTED
Enterococcus Faecium: DETECTED — AB
Enterococcus faecalis: DETECTED — AB
Escherichia coli: NOT DETECTED
Haemophilus influenzae: NOT DETECTED
Klebsiella aerogenes: NOT DETECTED
Klebsiella oxytoca: NOT DETECTED
Klebsiella pneumoniae: NOT DETECTED
Listeria monocytogenes: NOT DETECTED
Methicillin resistance mecA/C: DETECTED — AB
Neisseria meningitidis: NOT DETECTED
Proteus species: NOT DETECTED
Pseudomonas aeruginosa: NOT DETECTED
Salmonella species: NOT DETECTED
Serratia marcescens: NOT DETECTED
Staphylococcus aureus (BCID): NOT DETECTED
Staphylococcus epidermidis: DETECTED — AB
Staphylococcus lugdunensis: NOT DETECTED
Staphylococcus species: DETECTED — AB
Stenotrophomonas maltophilia: NOT DETECTED
Streptococcus agalactiae: NOT DETECTED
Streptococcus pneumoniae: NOT DETECTED
Streptococcus pyogenes: NOT DETECTED
Streptococcus species: NOT DETECTED
Vancomycin resistance: DETECTED — AB

## 2023-01-17 LAB — GLUCOSE, CAPILLARY
Glucose-Capillary: 150 mg/dL — ABNORMAL HIGH (ref 70–99)
Glucose-Capillary: 151 mg/dL — ABNORMAL HIGH (ref 70–99)
Glucose-Capillary: 159 mg/dL — ABNORMAL HIGH (ref 70–99)
Glucose-Capillary: 164 mg/dL — ABNORMAL HIGH (ref 70–99)
Glucose-Capillary: 174 mg/dL — ABNORMAL HIGH (ref 70–99)
Glucose-Capillary: 191 mg/dL — ABNORMAL HIGH (ref 70–99)

## 2023-01-17 LAB — CK: Total CK: 5830 U/L — ABNORMAL HIGH (ref 38–234)

## 2023-01-17 LAB — CBC
HCT: 30 % — ABNORMAL LOW (ref 36.0–46.0)
Hemoglobin: 9.5 g/dL — ABNORMAL LOW (ref 12.0–15.0)
MCH: 27.8 pg (ref 26.0–34.0)
MCHC: 31.7 g/dL (ref 30.0–36.0)
MCV: 87.7 fL (ref 80.0–100.0)
Platelets: UNDETERMINED 10*3/uL (ref 150–400)
RBC: 3.42 MIL/uL — ABNORMAL LOW (ref 3.87–5.11)
RDW: 15.7 % — ABNORMAL HIGH (ref 11.5–15.5)
WBC: 11.9 10*3/uL — ABNORMAL HIGH (ref 4.0–10.5)
nRBC: 0 % (ref 0.0–0.2)

## 2023-01-17 LAB — PHOSPHORUS
Phosphorus: 2.5 mg/dL (ref 2.5–4.6)
Phosphorus: 2.5 mg/dL (ref 2.5–4.6)

## 2023-01-17 LAB — BASIC METABOLIC PANEL
Anion gap: 6 (ref 5–15)
BUN: 12 mg/dL (ref 8–23)
CO2: 16 mmol/L — ABNORMAL LOW (ref 22–32)
Calcium: 6.8 mg/dL — ABNORMAL LOW (ref 8.9–10.3)
Chloride: 107 mmol/L (ref 98–111)
Creatinine, Ser: 0.54 mg/dL (ref 0.44–1.00)
GFR, Estimated: >60 mL/min (ref >60)
Glucose, Bld: 170 mg/dL — ABNORMAL HIGH (ref 70–99)
Potassium: 4.3 mmol/L (ref 3.5–5.1)
Sodium: 129 mmol/L — ABNORMAL LOW (ref 135–145)

## 2023-01-17 LAB — CULTURE, BLOOD (ROUTINE X 2)

## 2023-01-17 LAB — PROTIME-INR
INR: 1.1 (ref 0.8–1.2)
Prothrombin Time: 14.9 seconds (ref 11.4–15.2)

## 2023-01-17 MED ORDER — SODIUM CHLORIDE 0.9 % IV SOLN
10.0000 mg/kg | Freq: Every day | INTRAVENOUS | Status: DC
  Administered 2023-01-17: 450 mg via INTRAVENOUS
  Filled 2023-01-17 (×2): qty 9

## 2023-01-17 MED ORDER — ENOXAPARIN SODIUM 60 MG/0.6ML IJ SOSY
1.0000 mg/kg | PREFILLED_SYRINGE | Freq: Two times a day (BID) | INTRAMUSCULAR | Status: DC
  Administered 2023-01-17 – 2023-01-18 (×3): 45 mg via SUBCUTANEOUS
  Filled 2023-01-17 (×3): qty 0.6

## 2023-01-17 MED ORDER — MIDAZOLAM HCL 2 MG/2ML IJ SOLN
1.0000 mg | INTRAMUSCULAR | Status: DC | PRN
  Administered 2023-01-17 – 2023-01-18 (×4): 2 mg via INTRAVENOUS
  Filled 2023-01-17 (×4): qty 2

## 2023-01-17 MED ORDER — SODIUM CHLORIDE 0.9 % IV SOLN
8.0000 mg/kg | Freq: Every day | INTRAVENOUS | Status: DC
  Filled 2023-01-17: qty 7

## 2023-01-17 NOTE — Progress Notes (Signed)
Febrile at 101.3, administered prescribed acetaminophen and placed ice packs in bilateral axilla. Will continue to monitor.

## 2023-01-17 NOTE — Progress Notes (Signed)
Dr. Belia Heman ordered EKG, EKG obtained and reviewed by Dr. Belia Heman, no new orders at this time. Will continue to monitor patient.

## 2023-01-17 NOTE — Progress Notes (Signed)
Pharmacy Antibiotic Note  Heather [name] is a 72 y.o. female w/ PMH of DM, depression, HTN, diabetic polyneuropathy, tobacco use disorder, CAD, chronic pain, COPD, s/p TAVR, HFpEF, carotid stenosis, anxiety, PAD, history of ICH, sacral ulcers, Hx left AKA, seizures admitted on 01/14/2023 with sepsis.  Pharmacy has been consulted for daptomycin dosing for ischemic / gangrenous leg / infected sacral wounds.  Patient with polymicrobial bacteremia including VRE  Plan:   Daptomycin 450 mg (10 mg/kg) IV q24h --Baseline CK pending  Height: 5' (152.4 cm) Weight: 45.7 kg (100 lb 12 oz) IBW/kg (Calculated) : 45.5  Temp (24hrs), Avg:100.3 F (37.9 C), Min:99.5 F (37.5 C), Max:101.3 F (38.5 C)  Recent Labs  Lab 01/12/2023 0828 12/29/2022 0955 01/19/2023 1359 01/14/23 0027 01/14/23 0554 01/14/23 1440 01/15/23 0657 01/16/23 0935 01/17/23 1133  WBC 21.6*  --   --   --  13.4*  --   --  13.5* 11.9*  CREATININE 1.07*  --    < > 0.50 0.42* 0.43* 0.44 0.53 0.54  LATICACIDVEN 2.9* 2.5*  --  2.9*  --   --   --   --   --    < > = values in this interval not displayed.     Estimated Creatinine Clearance: 47.7 mL/min (by C-G formula based on SCr of 0.54 mg/dL).    Allergies  Allergen Reactions   Iodinated Contrast Media Hives   Tramadol Hcl Other (See Comments)    SEIZURE   Sulfa Antibiotics Hives   Shellfish Allergy Nausea And Vomiting and Rash    Scallops specifically     Antimicrobials this admission: Azithromycin 5/21 x 1 Cefepime 5/21 x 1 Vancomycin 5/21 >> 5/25 Zosyn 5/21 >> 5/25 Daptomycin 5/25 >>  Microbiology results: 5/21 BCx: 1/2 sets GPR, 1/2 sets GPC (BCID detected Enterococcus faecalis, Enterococcus faecium, vancomycin resistance detected, MRSE) 5/21 MRSA PCR:(-)  Thank you for allowing pharmacy to be a part of this patient's care.  Tressie Ellis 01/17/2023 12:34 PM

## 2023-01-17 NOTE — Progress Notes (Signed)
Patient noted to have an irregular heart rhythm with heart rate sustained in the high 120s to low 130s.  Per central telemetry reading appears to be multifocal atrial tachycardia vs atrial fibrillation with RVR. Dr. Belia Heman notified, will continue to monitor patient.

## 2023-01-17 NOTE — Progress Notes (Signed)
NAME:  Heather [name], MRN:  [id number], DOB:  September 29, 1950, LOS: 4 ADMISSION DATE:  01/03/2023 CHIEF COMPLAINT:  resp distress   History of Present Illness:  72 y.o. female with medical history significant of HFpEF, CAD s/p DES to LAD (2015), rheumatic heart disease s/p TAVR, T2DM, HTN, HLD, PAD s/p multiple stents and left AKA, COPD, who presents to the ED with c/o altered mental status.    Patient with severe metabolic encephalopathy patient's husband, there has been a slow decline over the last few weeks, however patient became suddenly altered yesterday evening and began to speak without sense  her right lower extremity has become paler over the last several weeks and he has not seen her move it recently but moves it for her intermittently.   ED course: On arrival to the ED, patient was hypertensive at 129/92 with heart rate of 149.  She was saturating at 100% on room air.  She was afebrile at 97.6.  Initial workup with VBG demonstrating pH of 7.14, pCO2 less than 18, glucose of 605, bicarb less than 7, potassium 3.2, creatinine 1.07 with GFR 56, albumin 2.8, WBC 21.6, lactic acid 2.9, INR 1.3, and urinalysis with ketonuria.  CT of the head with no acute intracranial abnormalities.  Chest x-ray with concerns for pneumonia.  Patient started on IV antibiotics for pneumonia.  Patient started on IV insulin for DKA.  Vascular surgery consulted for ischemic limb; patient started on IV heparin.  TRH contacted for admission, PCCM asked to Consult   Significant Hospital Events: Including procedures, antibiotic start and stop dates in addition to other pertinent events   5/21 ICU admission for acidosis and encephalopathy 5/22 remains on vent, remains critically ill, DNR status per family request 5/23 remains on vent, remains critically ill 5/24 remains mottled, on vent critically ill     Micro Data:  CULTURES PENDING  Antimicrobials:   Antibiotics Given (last 72 hours)     Date/Time Action  Medication Dose Rate   01/14/23 1219 New Bag/Given   vancomycin (VANCOREADY) IVPB 750 mg/150 mL 750 mg 150 mL/hr   01/14/23 1346 New Bag/Given   piperacillin-tazobactam (ZOSYN) IVPB 3.375 g 3.375 g 12.5 mL/hr   01/14/23 2138 New Bag/Given   piperacillin-tazobactam (ZOSYN) IVPB 3.375 g 3.375 g 12.5 mL/hr   01/15/23 4098 New Bag/Given   piperacillin-tazobactam (ZOSYN) IVPB 3.375 g 3.375 g 12.5 mL/hr   01/15/23 1224 New Bag/Given   vancomycin (VANCOREADY) IVPB 750 mg/150 mL 750 mg 150 mL/hr   01/15/23 1436 New Bag/Given   piperacillin-tazobactam (ZOSYN) IVPB 3.375 g 3.375 g 12.5 mL/hr   01/15/23 2135 New Bag/Given   piperacillin-tazobactam (ZOSYN) IVPB 3.375 g 3.375 g 12.5 mL/hr   01/16/23 0616 New Bag/Given   piperacillin-tazobactam (ZOSYN) IVPB 3.375 g 3.375 g 12.5 mL/hr   01/16/23 1334 New Bag/Given   vancomycin (VANCOREADY) IVPB 750 mg/150 mL 750 mg 150 mL/hr   01/16/23 1553 New Bag/Given   piperacillin-tazobactam (ZOSYN) IVPB 3.375 g 3.375 g 12.5 mL/hr   01/16/23 2227 New Bag/Given   piperacillin-tazobactam (ZOSYN) IVPB 3.375 g 3.375 g 12.5 mL/hr   01/17/23 0507 New Bag/Given   piperacillin-tazobactam (ZOSYN) IVPB 3.375 g 3.375 g 12.5 mL/hr            Interim History / Subjective:  Multiple admission in the past Cyanosis from waist down Critical limb ischemia Remains Mottled from waist down Severe resp failure, failed SBT  Remains on vent Critically ill Prognosis is poor and grave  Vent Mode:  PRVC FiO2 (%):  [30 %] 30 % Set Rate:  [20 bmp] 20 bmp Vt Set:  [450 mL] 450 mL PEEP:  [5 cmH20] 5 cmH20 Plateau Pressure:  [15 cmH20-20 cmH20] 16 cmH20     Objective   Blood pressure (!) 88/57, pulse (!) 124, temperature 100 F (37.8 C), resp. rate 20, height 5' (1.524 m), weight 45.7 kg, SpO2 96 %.    Vent Mode: PRVC FiO2 (%):  [30 %] 30 % Set Rate:  [20 bmp] 20 bmp Vt Set:  [450 mL] 450 mL PEEP:  [5 cmH20] 5 cmH20 Plateau Pressure:  [15 cmH20-20 cmH20] 16 cmH20    Intake/Output Summary (Last 24 hours) at 01/17/2023 0713 Last data filed at 01/17/2023 0533 Gross per 24 hour  Intake 1778.29 ml  Output 1000 ml  Net 778.29 ml    Filed Weights   01/20/2023 0900 01/10/2023 0926 01/17/23 0500  Weight: 45.6 kg 45.6 kg 45.7 kg      REVIEW OF SYSTEMS  PATIENT IS UNABLE TO PROVIDE COMPLETE REVIEW OF SYSTEMS DUE TO SEVERE CRITICAL ILLNESS   PHYSICAL EXAMINATION:  GENERAL:critically ill appearing, +resp distress EYES: Pupils equal, round, reactive to light.  No scleral icterus.  MOUTH: Moist mucosal membrane. INTUBATED NECK: Supple.  PULMONARY: Lungs clear to auscultation, +rhonchi, +wheezing CARDIOVASCULAR: S1 and S2.  Regular rate and rhythm GASTROINTESTINAL: Soft, nontender, -distended. Positive bowel sounds.  MUSCULOSKELETAL:+cyanosis, mottled skin, left AKA NEUROLOGIC: obtunded,sedated SKIN:abnormal, cold to touch, Capillary refill delayed  Weak pulses RT foot  Labs/imaging that I havepersonally reviewed  (right click and "Reselect all SmartList Selections" daily)      ASSESSMENT AND PLAN SYNOPSIS  72 yo white female with multiple medical issues with severe metabolic acidosis due to severe DKA with severe metabolic encephalopathy with acute cyanosis of lower half of her body mottled skin with critical limb ischemia active smoker, possible aspiration pneumonia   Severe ACUTE Hypoxic and Hypercapnic Respiratory Failure -continue Mechanical Ventilator support -Wean Fio2 and PEEP as tolerated -VAP/VENT bundle implementation - Wean PEEP & FiO2 as tolerated, maintain SpO2 > 88% - Head of bed elevated 30 degrees, VAP protocol in place - Plateau pressures less than 30 cm H20  - Intermittent chest x-ray & ABG PRN - Ensure adequate pulmonary hygiene  -will perform SAT/SBT when respiratory parameters are met   HIGH RISK FOR CARDIAC ARREST  CARDIAC ICU monitoring  RENAL SEVERE ACIDOSIS ASSESS FOR BICARB INFUSION -continue Foley  Catheter-assess need -Avoid nephrotoxic agents -Follow urine output, BMP -Ensure adequate renal perfusion, optimize oxygenation -Renal dose medications   Intake/Output Summary (Last 24 hours) at 01/17/2023 0715 Last data filed at 01/17/2023 0533 Gross per 24 hour  Intake 1778.29 ml  Output 1000 ml  Net 778.29 ml     NEUROLOGY ACUTE METABOLIC ENCEPHALOPATHY WUA pending  SEPTIC shock SOURCE-LIMB ISCHEMIA, PNEUMONIA -use vasopressors to keep MAP>65 as needed -follow ABG and LA as needed -follow up cultures -emperic ABX -consider stress dose steroids -aggressive IV fluid Resuscitation as needed  INFECTIOUS DISEASE -continue antibiotics as prescribed -follow up cultures   ENDO - ICU hypoglycemic\Hyperglycemia protocol -check FSBS per protocol   GI GI PROPHYLAXIS as indicated  NUTRITIONAL STATUS DIET-->TF's as tolerated Constipation protocol as indicated   ELECTROLYTES -follow labs as needed -replace as needed -pharmacy consultation and following   ACUTE ANEMIA- TRANSFUSE AS NEEDED CONSIDER TRANSFUSION  IF HGB<7      Best practice (right click and "Reselect all SmartList Selections" daily)  Diet: NPO Central venous access:  N/A Arterial line:  N/A Foley:  N/A Mobility:  bed rest  Code Status: DNR Disposition:ICU  Labs   CBC: Recent Labs  Lab 01/07/2023 0828 01/14/23 0554 01/16/23 0935  WBC 21.6* 13.4* 13.5*  NEUTROABS 17.7*  --   --   HGB 14.4 11.9* 10.9*  HCT 46.9* 34.7* 33.4*  MCV 90.2 81.8 85.2  PLT 337 207 160     Basic Metabolic Panel: Recent Labs  Lab 01/17/2023 1359 12/25/2022 1603 01/14/23 0027 01/14/23 0554 01/14/23 1440 01/15/23 0657 01/16/23 0935 01/16/23 1242 01/16/23 1630  NA 138   < > 139 135 134* 132* 132*  --   --   K 4.3   < > 2.7* 3.9 3.5 3.9 4.5  --   --   CL 113*   < > 101 98 101 103 106  --   --   CO2 <7*   < > 29 28 27 24  21*  --   --   GLUCOSE 377*   < > 247* 150* 70 101* 130*  --   --   BUN 19   < > 13 11  10 10 9   --   --   CREATININE 0.86   < > 0.50 0.42* 0.43* 0.44 0.53  --   --   CALCIUM 7.8*   < > 7.1* 6.8* 6.6* 6.9* 7.3*  --   --   MG 1.5*  --   --   --   --  1.7 1.9  --   --   PHOS 3.3  --   --   --   --   --   --  2.4* 2.6   < > = values in this interval not displayed.    GFR: Estimated Creatinine Clearance: 47.7 mL/min (by C-G formula based on SCr of 0.53 mg/dL). Recent Labs  Lab 01/16/2023 0828 01/20/2023 0955 01/14/23 0027 01/14/23 0554 01/16/23 0935  WBC 21.6*  --   --  13.4* 13.5*  LATICACIDVEN 2.9* 2.5* 2.9*  --   --      Liver Function Tests: Recent Labs  Lab 01/12/2023 0828 01/14/23 0554  AST 23 168*  ALT 18 32  ALKPHOS 104 75  BILITOT 2.3* 0.9  PROT 6.3* 4.5*  ALBUMIN 2.8* 1.9*    No results for input(s): "LIPASE", "AMYLASE" in the last 168 hours. No results for input(s): "AMMONIA" in the last 168 hours.  ABG    Component Value Date/Time   PHART 7.5 (H) 01/19/2023 2029   PCO2ART 35 01/07/2023 2029   PO2ART 127 (H) 01/04/2023 2029   HCO3 27.3 01/11/2023 2029   TCO2 22 08/08/2022 0434   ACIDBASEDEF 23.3 (H) 01/01/2023 0955   O2SAT 99.2 01/17/2023 2029     Coagulation Profile: Recent Labs  Lab 01/07/2023 0828 01/14/23 0554  INR 1.3* 1.1     Cardiac Enzymes: No results for input(s): "CKTOTAL", "CKMB", "CKMBINDEX", "TROPONINI" in the last 168 hours.  HbA1C: Hgb A1c MFr Bld  Date/Time Value Ref Range Status  01/09/2023 01:59 PM 13.3 (H) 4.8 - 5.6 % Final    Comment:    (NOTE)         Prediabetes: 5.7 - 6.4         Diabetes: >6.4         Glycemic control for adults with diabetes: <7.0   08/05/2022 04:00 AM 12.9 (H) 4.8 - 5.6 % Final    Comment:    (NOTE)  Prediabetes: 5.7 - 6.4         Diabetes: >6.4         Glycemic control for adults with diabetes: <7.0     CBG: Recent Labs  Lab 01/16/23 1124 01/16/23 1552 01/16/23 1957 01/16/23 2323 01/17/23 0327  GLUCAP 101* 147* 156* 125* 164*    Home Medications  Prior to  Admission medications   Medication Sig Start Date End Date Taking? Authorizing Provider  acetaminophen (TYLENOL) 325 MG tablet Take 2 tablets (650 mg total) by mouth every 6 (six) hours as needed for mild pain (or Fever >/= 101). 09/29/22  Yes Leroy Sea, MD  albuterol (VENTOLIN HFA) 108 (90 Base) MCG/ACT inhaler Inhale 2 puffs into the lungs every 4 (four) hours as needed for wheezing or shortness of breath.   Yes [provider]  amLODipine (NORVASC) 10 MG tablet Take 1 tablet (10 mg total) by mouth daily. 09/29/22 09/29/23 Yes Leroy Sea, MD  atorvastatin (LIPITOR) 80 MG tablet Take 80 mg by mouth at bedtime.   Yes [provider]  carvedilol (COREG) 6.25 MG tablet Take 1 tablet (6.25 mg total) by mouth 2 (two) times daily with a meal. 09/22/22 12/26/2022 Yes Zigmund Daniel., MD  cetirizine (ZYRTEC) 10 MG tablet Take 10 mg by mouth daily.   Yes [provider]  cholecalciferol (VITAMIN D3) 25 MCG (1000 UNIT) tablet Take 1,000 Units by mouth daily.   Yes [provider]  ciprofloxacin (CIPRO) 500 MG tablet Take 500 mg by mouth 2 (two) times daily.   Yes [provider]  clopidogrel (PLAVIX) 75 MG tablet Take 1 tablet (75 mg total) by mouth daily. 11/15/20  Yes Regalado, Belkys A, MD  diclofenac Sodium (VOLTAREN) 1 % GEL Apply 2 g topically 4 (four) times daily as needed. 10/31/22  Yes Enedina Finner, MD  escitalopram (LEXAPRO) 10 MG tablet Take 1 tablet (10 mg total) by mouth daily. 09/22/22 01/08/2023 Yes Zigmund Daniel., MD  hydrALAZINE (APRESOLINE) 50 MG tablet Take 1 tablet (50 mg total) by mouth 3 (three) times daily. 09/29/22 12/31/2022 Yes Leroy Sea, MD  insulin aspart (NOVOLOG) 100 UNIT/ML FlexPen Before each meal 3 times a day, 140-199 - 2 units, 200-250 - 4 units, 251-299 - 8 units,  300-349 - 10 units,  350 or above 12 units. I 09/29/22  Yes Leroy Sea, MD  insulin aspart (NOVOLOG) 100 UNIT/ML injection Inject 3 Units into the  skin 3 (three) times daily with meals. Hold for NPO or consuming less than 50% of meals Patient taking differently: Inject 3 Units into the skin See admin instructions. Inject 3 units subcutaneously with meals. HOLD for NPO or consuming < 50% of meals. 09/22/22  Yes Zigmund Daniel., MD  insulin glargine (LANTUS) 100 UNIT/ML injection Inject 0.1 mLs (10 Units total) into the skin 2 (two) times daily. 09/29/22  Yes Leroy Sea, MD  levETIRAcetam (KEPPRA) 500 MG tablet Take 1 tablet (500 mg total) by mouth 2 (two) times daily. 10/06/22  Yes Marinda Elk, MD  mirtazapine (REMERON) 7.5 MG tablet Take 1 tablet (7.5 mg total) by mouth at bedtime. 09/22/22 01/12/2023 Yes Zigmund Daniel., MD  Multiple Vitamins-Minerals (MULTIVITAMIN WITH MINERALS) tablet Take 1 tablet by mouth daily.   Yes [provider]  nicotine (NICODERM CQ - DOSED IN MG/24 HOURS) 14 mg/24hr patch Place 1 patch (14 mg total) onto the skin daily. 10/31/22  Yes Enedina Finner, MD  pantoprazole (PROTONIX) 40 MG tablet Take 1 tablet (40 mg total) by mouth daily. 09/22/22 01/11/2023 Yes Zigmund Daniel., MD  polyethylene glycol (MIRALAX / GLYCOLAX) 17 g packet Take 17 g by mouth daily. Titrate as needed for constipation. Patient taking differently: Take 17 g by mouth daily. 09/22/22  Yes Zigmund Daniel., MD  senna-docusate (SENOKOT-S) 8.6-50 MG tablet Take 1 tablet by mouth daily. 09/30/22  Yes Leroy Sea, MD  traZODone (DESYREL) 50 MG tablet Take 0.5 tablets (25 mg total) by mouth at bedtime as needed for sleep. 09/22/22 01/03/2023 Yes Zigmund Daniel., MD          PATIENT WITH VERY POOR PROGNOSIS PATIENT IN THE DYING PROCESS I ANTICIPATE PROLONGED ICU LOS     DVT/GI PRX  assessed I Assessed the need for Labs I Assessed the need for Foley I Assessed the need for Central Venous Line Family Discussion when available I Assessed the need for Mobilization I made an Assessment of medications to  be adjusted accordingly Safety Risk assessment completed  CASE DISCUSSED IN MULTIDISCIPLINARY ROUNDS WITH ICU TEAM  Follow Up Palliative Care team assessment Recommend one way extubation   Critical Care Time devoted to patient care services described in this note is 55 minutes.  Critical care was necessary to treat /prevent imminent and life-threatening deterioration. Overall, patient is critically ill, prognosis is guarded.  Patient with Multiorgan failure and at high risk for cardiac arrest and death.    Lucie Leather, M.D.  [facility]  Medical Director [facility] Medical Director [facility] Cardio-Pulmonary Department

## 2023-01-17 NOTE — Progress Notes (Signed)
PHARMACY - PHYSICIAN COMMUNICATION CRITICAL VALUE ALERT - BLOOD CULTURE IDENTIFICATION (BCID)  Heather [name] is an 72 y.o. female who presented to [facility] on 01/08/2023 with a chief complaint of encephalopathy, DKA, limb ischemia, pneumonia  Assessment:  1/4 bottles GPR, 1/4 bottles GPC. BCID on GPC detected MRSE, E faecalis, E faecium. Vancomycin resistance was detected.  Name of physician (or Provider) Contacted: Dr. Belia Heman  Current antibiotics: Vancomycin + Zosyn  Changes to prescribed antibiotics recommended:  Change vancomycin to daptomycin. Will need ID approval. Discussed with provider, ID to be consulted  Results for orders placed or performed during the hospital encounter of 01/02/2023  Blood Culture ID Panel (Reflexed) (Collected: 01/06/2023  8:40 AM)  Result Value Ref Range   Enterococcus faecalis DETECTED (A) NOT DETECTED   Enterococcus Faecium DETECTED (A) NOT DETECTED   Listeria monocytogenes NOT DETECTED NOT DETECTED   Staphylococcus species DETECTED (A) NOT DETECTED   Staphylococcus aureus (BCID) NOT DETECTED NOT DETECTED   Staphylococcus epidermidis DETECTED (A) NOT DETECTED   Staphylococcus lugdunensis NOT DETECTED NOT DETECTED   Streptococcus species NOT DETECTED NOT DETECTED   Streptococcus agalactiae NOT DETECTED NOT DETECTED   Streptococcus pneumoniae NOT DETECTED NOT DETECTED   Streptococcus pyogenes NOT DETECTED NOT DETECTED   A.calcoaceticus-baumannii NOT DETECTED NOT DETECTED   Bacteroides fragilis NOT DETECTED NOT DETECTED   Enterobacterales NOT DETECTED NOT DETECTED   Enterobacter cloacae complex NOT DETECTED NOT DETECTED   Escherichia coli NOT DETECTED NOT DETECTED   Klebsiella aerogenes NOT DETECTED NOT DETECTED   Klebsiella oxytoca NOT DETECTED NOT DETECTED   Klebsiella pneumoniae NOT DETECTED NOT DETECTED   Proteus species NOT DETECTED NOT DETECTED   Salmonella species NOT DETECTED NOT DETECTED   Serratia marcescens NOT DETECTED NOT DETECTED    Haemophilus influenzae NOT DETECTED NOT DETECTED   Neisseria meningitidis NOT DETECTED NOT DETECTED   Pseudomonas aeruginosa NOT DETECTED NOT DETECTED   Stenotrophomonas maltophilia NOT DETECTED NOT DETECTED   Candida albicans NOT DETECTED NOT DETECTED   Candida auris NOT DETECTED NOT DETECTED   Candida glabrata NOT DETECTED NOT DETECTED   Candida krusei NOT DETECTED NOT DETECTED   Candida parapsilosis NOT DETECTED NOT DETECTED   Candida tropicalis NOT DETECTED NOT DETECTED   Cryptococcus neoformans/gattii NOT DETECTED NOT DETECTED   Methicillin resistance mecA/C DETECTED (A) NOT DETECTED   Vancomycin resistance DETECTED (A) NOT DETECTED   Heather [name] 01/17/2023  11:16 AM

## 2023-01-17 NOTE — Progress Notes (Addendum)
        Date: 01/17/2023  Patient name: Heather [name]  Medical record number: [id number]  Date of birth: 06-10-1951   72 year old woamn with hx of CAD sp DES, rheumatic heart disease with TAVR, DM hTN, PAD admitted with what was thought to be metabolic encephalopathy.  She is critically ill on and on the ventilator  LE are mottle and VVS were consulted.  She had been on vancomycin and zosyn. Blood cultures  drawn on admission are now growing of cocci and gram-positive rods.  BC ID identifies, some resistant Enterococcus faecium, Enterococcus faecalis appears to be methicillin resistant Staphylococcus epidermidis  I recommend change to daptomycin  My understanding is that in addition to her vascular compromise she has a large sacral wound.  At present I would focus on treating the blood stream infection  IF she survives would get TTE --I do not know how to order them at [facility].  Given TAVR would worry about PVE    Paulette Blanch Dam 01/17/2023, 2:12 PM

## 2023-01-17 NOTE — Plan of Care (Signed)
Continuing with plan of care. 

## 2023-01-17 NOTE — Progress Notes (Signed)
At 1500 Dr. Belia Heman and palliative nurse at bedside with patient and spoke with patient's daughters regarding plan of care.

## 2023-01-17 NOTE — Progress Notes (Incomplete)
Daily Progress Note   Patient Name: Heather [name]       Date: 01/17/2023 DOB: 1950/11/12  Age: 72 y.o. MRN#: [id number] Attending Physician: Erin Fulling, MD Primary Care Physician: Toy Cookey, FNP Admit Date: 12/24/2022  Reason for Consultation/Follow-up: Establishing goals of care  HPI/Brief Hospital Review: 71 year old female with past medical history significant of HFpEF, CAD s/p DES to LAD (2015), rheumatic heart disease s/p TAVR, T2DM, HTN, HLD, PAD s/p multiple stents and left AKA, COPD, who presents to the ED with complaints of altered mental status.    Patient with severe metabolic encephalopathy Per patient's husband, there has been a slow decline over the last few weeks, however patient became suddenly altered evening of admission and began to speak without sense Per husband, her right lower extremity has become more pale over the last several weeks and he has not seen her move it recently but he attempts to move it for her intermittently  Vascular surgery consulted for ischemic limb and due to severity of occlusion no revascularization options to offer   Required endotracheal intubation 5/21 due to worsening encephalopathy, increased WOB and use of accessory muscles  5/25 Remains intubated and sedated Lower extremities remain mottled Has moments of wakefulness and will complain of pain to nursing staff  Palliative Medicine consulted for assisting with goals of care conversations.  Subjective: Extensive chart review has been completed prior to meeting patient including labs, vital signs, imaging, progress notes, orders, and available advanced directive documents from current and previous encounters.    Requested to engage with family by CCM team with ongoing deterioration.  According to nursing staff, family has been calling in for updates and is questioning when her procedure is scheduled for to remove her leg.  Called and spoke with daughter-Desi earlier in day with plans made to meet at bedside when she is off work around [employer].  Met with daughter-Desi, Dr. Belia Heman and nursing staff at bedside. Dr. Belia Heman provided medical updates. Remains sedated and intubated, has failred mu   ROS   Objective:  Physical Exam          Vital Signs: BP (!) 92/59   Pulse (!) 120   Temp 100.2 F (37.9 C) (Bladder)   Resp 20   Ht 5' (1.524 m)   Wt 45.7 kg  SpO2 98%   BMI 19.68 kg/m  SpO2: SpO2: 98 % O2 Device: O2 Device: Ventilator O2 Flow Rate:     Palliative Care Assessment & Plan   Assessment/Recommendation/Plan  ***     GOC/Code Status: {Palliative Code status:23503}  Prognosis:  {Palliative Care Prognosis:23504}  Discharge Planning: {Palliative dispostion:23505}  Care plan was discussed with ***  Thank you for allowing the Palliative Medicine Team to assist in the care of this patient.  Total time:    Time spent includes: Detailed review of medical records (labs, imaging, vital signs), medically appropriate exam (mental status, respiratory, cardiac, skin), discussed with treatment team, counseling and educating patient, family and staff, documenting clinical information, medication management and coordination of care.  Leeanne Deed, DNP, AGNP-C Palliative Medicine   Please contact Palliative Medicine Team phone at [phone] for questions and concerns.

## 2023-01-17 NOTE — Progress Notes (Signed)
Daily Progress Note   Patient Name: Heather [name]       Date: 01/17/2023 DOB: March 18, 1951  Age: 72 y.o. MRN#: [id number] Attending Physician: Erin Fulling, MD Primary Care Physician: Toy Cookey, FNP Admit Date: 01/01/2023  Reason for Consultation/Follow-up: Establishing goals of care  HPI/Brief Hospital Review: 72 year old female with past medical history significant of HFpEF, CAD s/p DES to LAD (2015), rheumatic heart disease s/p TAVR, T2DM, HTN, HLD, PAD s/p multiple stents and left AKA, COPD, who presents to the ED with complaints of altered mental status.    Patient with severe metabolic encephalopathy Per patient's husband, there has been a slow decline over the last few weeks, however patient became suddenly altered evening of admission and began to speak without sense Per husband, her right lower extremity has become more pale over the last several weeks and he has not seen her move it recently but he attempts to move it for her intermittently   Vascular surgery consulted for ischemic limb and due to severity of occlusion no revascularization options to offer   Required endotracheal intubation 5/21 due to worsening encephalopathy, increased WOB and use of accessory muscles   5/25 Remains intubated and sedated Lower extremities remain mottled Has moments of wakefulness and will complain of pain to nursing staff   Palliative Medicine consulted for assisting with goals of care conversations.  Subjective: Extensive chart review has been completed prior to meeting patient including labs, vital signs, imaging, progress notes, orders, and available advanced directive documents from current and previous encounters.    Requested to engage with family by CCM team due to ongoing  deterioration. According to nursing staff, family has been calling in for updates and is questioning when her procedure is scheduled for to remove her leg.   Called and spoke with daughter-Heather [name] earlier in day with plans made to meet at bedside when she is off work around [employer].   Met with daughter-Heather [name], Dr. Belia Heman and nursing staff at bedside, daughter-[name] available by FaceTime. Dr. Belia Heman provided medical updates. Remains sedated and intubated, has failed multiple SBT, right lower limb remains ischemic and deemed not a candidate for revascularization, worsening sepsis with multiple sources of infection including ischemic limb and sacral wound. Family confirms their understanding of current condition. Dr. Belia Heman shares his ongoing concern for deterioration and  poor prognosis.  Family shares they are firm in advocating for their mom. They feel during previous conversations, Heather [name] was coherent and able to understand the severity of her situation. According to her daughters, Heather [name] was clear when she shared with them she was not prepared to be removed from "life support." Dr. Belia Heman presented moving forward with trach/PEG placement in honor of Heather [name]'s wishes. Both daughters-Heather [name] and [name] wish to proceed with consults for trach/PEG placement.  All questions and concerns were addressed. Per Dr. Belia Heman, PMT to monitor peripherally as goals are clear and have been set.   Objective:  Vital Signs: BP (!) 92/59   Pulse (!) 120   Temp 100.2 F (37.9 C) (Bladder)   Resp 20   Ht 5' (1.524 m)   Wt 45.7 kg   SpO2 98%   BMI 19.68 kg/m  SpO2: SpO2: 98 % O2 Device: O2 Device: Ventilator O2 Flow Rate:     Palliative Care Assessment & Plan   Assessment/Recommendation/Plan  DNR Pursuing trach/PEG placement PMT to shadow per CCM request  Care plan was discussed with CCM team and nursing staff.  Thank you for allowing the Palliative Medicine Team to assist in the care of this patient.  Total time:  25  minutes  Time spent includes: Detailed review of medical records (labs, imaging, vital signs), medically appropriate exam (mental status, respiratory, cardiac, skin), discussed with treatment team, counseling and educating patient, family and staff, documenting clinical information, medication management and coordination of care.  Leeanne Deed, DNP, AGNP-C Palliative Medicine   Please contact Palliative Medicine Team phone at [phone] for questions and concerns.

## 2023-01-18 DIAGNOSIS — I70222 Atherosclerosis of native arteries of extremities with rest pain, left leg: Secondary | ICD-10-CM | POA: Diagnosis not present

## 2023-01-18 DIAGNOSIS — I998 Other disorder of circulatory system: Secondary | ICD-10-CM | POA: Diagnosis not present

## 2023-01-18 DIAGNOSIS — G9340 Encephalopathy, unspecified: Secondary | ICD-10-CM | POA: Diagnosis not present

## 2023-01-18 LAB — BASIC METABOLIC PANEL
Anion gap: 4 — ABNORMAL LOW (ref 5–15)
BUN: 13 mg/dL (ref 8–23)
CO2: 19 mmol/L — ABNORMAL LOW (ref 22–32)
Calcium: 7.1 mg/dL — ABNORMAL LOW (ref 8.9–10.3)
Chloride: 108 mmol/L (ref 98–111)
Creatinine, Ser: 0.43 mg/dL — ABNORMAL LOW (ref 0.44–1.00)
GFR, Estimated: >60 mL/min (ref >60)
Glucose, Bld: 159 mg/dL — ABNORMAL HIGH (ref 70–99)
Potassium: 3.6 mmol/L (ref 3.5–5.1)
Sodium: 131 mmol/L — ABNORMAL LOW (ref 135–145)

## 2023-01-18 LAB — CBC
HCT: 27.8 % — ABNORMAL LOW (ref 36.0–46.0)
Hemoglobin: 8.8 g/dL — ABNORMAL LOW (ref 12.0–15.0)
MCH: 27.6 pg (ref 26.0–34.0)
MCHC: 31.7 g/dL (ref 30.0–36.0)
MCV: 87.1 fL (ref 80.0–100.0)
Platelets: 148 10*3/uL — ABNORMAL LOW (ref 150–400)
RBC: 3.19 MIL/uL — ABNORMAL LOW (ref 3.87–5.11)
RDW: 15.6 % — ABNORMAL HIGH (ref 11.5–15.5)
WBC: 8.3 10*3/uL (ref 4.0–10.5)
nRBC: 0 % (ref 0.0–0.2)

## 2023-01-18 LAB — GLUCOSE, CAPILLARY
Glucose-Capillary: 162 mg/dL — ABNORMAL HIGH (ref 70–99)
Glucose-Capillary: 186 mg/dL — ABNORMAL HIGH (ref 70–99)
Glucose-Capillary: 193 mg/dL — ABNORMAL HIGH (ref 70–99)

## 2023-01-18 LAB — CK: Total CK: 4375 U/L — ABNORMAL HIGH (ref 38–234)

## 2023-01-18 LAB — CULTURE, BLOOD (ROUTINE X 2): Special Requests: ADEQUATE

## 2023-01-18 MED ORDER — FOLIC ACID 1 MG PO TABS: 1.0000 mg | ORAL_TABLET | Freq: Every day | ORAL | Status: DC

## 2023-01-18 MED ORDER — GLYCOPYRROLATE 1 MG PO TABS: 1.0000 mg | ORAL_TABLET | ORAL | Status: DC | PRN

## 2023-01-18 MED ORDER — ACETAMINOPHEN 325 MG PO TABS
650.0000 mg | ORAL_TABLET | Freq: Four times a day (QID) | ORAL | Status: DC | PRN
  Administered 2023-01-19 – 2023-01-20 (×2): 650 mg
  Filled 2023-01-18 (×2): qty 2

## 2023-01-18 MED ORDER — LEVETIRACETAM 100 MG/ML PO SOLN
500.0000 mg | Freq: Two times a day (BID) | ORAL | Status: DC
  Administered 2023-01-18: 500 mg
  Filled 2023-01-18: qty 5

## 2023-01-18 MED ORDER — ACETAMINOPHEN 650 MG RE SUPP: 650.0000 mg | Freq: Four times a day (QID) | RECTAL | Status: DC | PRN

## 2023-01-18 MED ORDER — THIAMINE MONONITRATE 100 MG PO TABS: 100.0000 mg | ORAL_TABLET | Freq: Every day | ORAL | Status: DC

## 2023-01-18 MED ORDER — MIDAZOLAM HCL 2 MG/2ML IJ SOLN
2.0000 mg | INTRAMUSCULAR | Status: DC | PRN
  Administered 2023-01-18 – 2023-01-20 (×10): 2 mg via INTRAVENOUS
  Filled 2023-01-18 (×8): qty 2

## 2023-01-18 MED ORDER — GLYCOPYRROLATE 0.2 MG/ML IJ SOLN
0.2000 mg | INTRAMUSCULAR | Status: DC | PRN
  Administered 2023-01-18 – 2023-01-20 (×2): 0.2 mg via INTRAVENOUS
  Filled 2023-01-18 (×2): qty 1

## 2023-01-18 MED ORDER — SODIUM CHLORIDE 0.9 % IV SOLN: INTRAVENOUS | Status: DC

## 2023-01-18 MED ORDER — GLYCOPYRROLATE 0.2 MG/ML IJ SOLN
0.2000 mg | INTRAMUSCULAR | Status: DC | PRN
  Filled 2023-01-18: qty 1

## 2023-01-18 MED ORDER — POLYVINYL ALCOHOL 1.4 % OP SOLN
1.0000 [drp] | Freq: Four times a day (QID) | OPHTHALMIC | Status: DC | PRN
Start: 2023-01-18 — End: 2023-01-18

## 2023-01-18 NOTE — Progress Notes (Signed)
Rectal tube became dislodged and no longer in rectum.  Patient cleansed and rectal tube remains out at this time due to discomfort.

## 2023-01-18 NOTE — Progress Notes (Signed)
Patient's family voiced desire for patient to be transitioned to comfort care with the mechanical ventilator to remain in place.  Dr. Belia Heman also spoke with family and orders for comfort care placed by him.

## 2023-01-18 NOTE — Progress Notes (Addendum)
PHARMACIST - PHYSICIAN COMMUNICATION  CONCERNING: IV to Oral Route Change Policy  RECOMMENDATION: This patient is receiving levetiracetam, folic acid, thiamine by the intravenous route.  Based on criteria approved by the Pharmacy and Therapeutics Committee, the intravenous medication(s) is/are being converted to the equivalent oral dose form(s).  DESCRIPTION: These criteria include: The patient is eating (either orally or via tube) and/or has been taking other orally administered medications for a least 24 hours The patient has no evidence of active gastrointestinal bleeding or impaired GI absorption (gastrectomy, short bowel, patient on TNA or NPO).  If you have questions about this conversion, please contact the Pharmacy Department    Tressie Ellis, [facility] 01/18/2023 9:47 AM

## 2023-01-18 NOTE — Plan of Care (Signed)
Continuing with plan of care. 

## 2023-01-18 NOTE — Progress Notes (Signed)
       Date: 01/18/2023  Patient name: Heather [name]  Medical record number: [id number]  Date of birth: April 18, 1951   Patient has not been to be weaned from the vent and ENT were consulted for tracheostomy.  Palliative care have also seen the patient and family initially was wanting aggressive care that they do not want tracheostomy.  Her blood cultures are growing Enterococcus VCM which is likely the vancomycin resistant species identified on BC ID diphtheroids are growing as well which are likely contaminant.  Would continue daptomycin  Dr. Joylene Draft to see formally tomorrow  Paulette Blanch [facility]  01/18/2023, 1:04 PM

## 2023-01-18 NOTE — Consult Note (Signed)
Heather [name], Heather [name] [id number] [birth year] Erin Fulling, MD  Reason for Consult: Failure to extubate  HPI: Patient history noted and documented in the chart.  Gravely ill.  Asked to evaluate for possible tracheostomy.  Allergies:  Allergies  Allergen Reactions   Iodinated Contrast Media Hives   Tramadol Hcl Other (See Comments)    SEIZURE   Sulfa Antibiotics Hives   Shellfish Allergy Nausea And Vomiting and Rash    Scallops specifically     ROS: Review of systems normal other than 12 systems except per HPI.  PMH:  Past Medical History:  Diagnosis Date   (HFpEF) heart failure with preserved ejection fraction (HCC)    Angina pectoris (HCC)    Anxiety    Aortic atherosclerosis (HCC)    Basal cell carcinoma    Bilateral carotid artery disease (HCC)    a.) carotid doppler 03/18/2021: 1-39% RICA, 40-59% LICA   Bipolar disorder (HCC)    Cervicalgia    Chronic mesenteric ischemia (HCC)    a.) s/p PTA 11/13/2020 --> 6 x 16 mm lifestream ballon expanding stent to SMA   COPD (chronic obstructive pulmonary disease) (HCC)    Coronary artery disease 03/20/2014   a.) LHC/PCI 03/20/2014: 70% mLAD (2.5 x 28 mm Xience Alpine DES), 70% dLCx, 70% dRCA; b.) LHC 01/25/2016: 90% ISR mLAD --> mLAD dissection with in stent --> 2.75 x 22 mm Resolute DES; c.) R/LHC 09/29/2016: 50% ISR mLAD, 70% dLAD, mPA 37, PCWP 24, AO sat 90%, PA sat 46%, CO 3.7, CI 2.46 - med mgmt   DDD (degenerative disc disease), cervical    Depression    Diabetic polyneuropathy (HCC)    GERD (gastroesophageal reflux disease)    Hyperlipidemia    Hypertension    Long term current use of antithrombotics/antiplatelets    a.) on DAPT (ASA + clopidogrel)   Memory loss    NSTEMI (non-ST elevated myocardial infarction) (HCC) 07/2016   a.) troponins trended (normal < 0.034 ng/mL): 0.325 --> 1.480 --> 2.330 --> 1.660 --> 0.169 ng/mL   PAD (peripheral artery disease) (HCC)    a.) s/p PTA and stenting SMA 11/13/2020; b.) s/p PTA and  stenting of RIGHT SFA and popliteal 04/12/2021; c.) s/p PTA and kissing balloon stents to BILATERAL CIAs 05/28/2021   Respiratory failure with hypoxia (HCC)    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   a.) 23 mm Sapien 3 via suprasternal approach   Schizophrenia (HCC)    Severe aortic stenosis    a.) s/p TAVR 09/30/2016; 23 mm Sapien 3 via a suprasternal approach   Type 2 diabetes mellitus treated with insulin (HCC)    Vertigo     FH:  Family History  Problem Relation Age of Onset   Mental illness Mother    Breast cancer Neg Hx     SH:  Social History   Socioeconomic History   Marital status: Married    Spouse name: [name]   Number of children: 2   Years of education: Not on file   Highest education level: High school graduate  Occupational History   Not on file  Tobacco Use   Smoking status: Every Day    Packs/day: 1.50    Years: 48.00    Additional pack years: 0.00    Total pack years: 72.00    Types: Cigarettes   Smokeless tobacco: Never  Vaping Use   Vaping Use: Never used  Substance and Sexual Activity   Alcohol use: Not Currently   Drug use: Not  Currently   Sexual activity: Not Currently  Other Topics Concern   Not on file  Social History Narrative   Lives at home with [name]   Social Determinants of Health   Financial Resource Strain: Medium Risk (10/19/2018)   Overall Financial Resource Strain (CARDIA)    Difficulty of Paying Living Expenses: Somewhat hard  Food Insecurity: No Food Insecurity (10/18/2022)   Hunger Vital Sign    Worried About Running Out of Food in the Last Year: Never true    Ran Out of Food in the Last Year: Never true  Recent Concern: Food Insecurity - Food Insecurity Present (08/11/2022)   Hunger Vital Sign    Worried About Running Out of Food in the Last Year: Sometimes true    Ran Out of Food in the Last Year: Sometimes true  Transportation Needs: No Transportation Needs (10/18/2022)   PRAPARE -  Administrator, Civil Service (Medical): No    Lack of Transportation (Non-Medical): No  Physical Activity: Inactive (10/19/2018)   Exercise Vital Sign    Days of Exercise per Week: 0 days    Minutes of Exercise per Session: 0 min  Stress: Not on file  Social Connections: Unknown (10/19/2018)   Social Connection and Isolation Panel [NHANES]    Frequency of Communication with Friends and Family: Not on file    Frequency of Social Gatherings with Friends and Family: Not on file    Attends Religious Services: More than 4 times per year    Active Member of [employer] or Organizations: No    Attends Banker Meetings: Never    Marital Status: Married  Catering manager Violence: Not At Risk (10/18/2022)   Humiliation, Afraid, Rape, and Kick questionnaire    Fear of Current or Ex-Partner: No    Emotionally Abused: No    Physically Abused: No    Sexually Abused: No    PSH:  Past Surgical History:  Procedure Laterality Date   AMPUTATION Left 08/14/2022   Procedure: ABOVE KNEE AMPUTATION;  Surgeon: Maeola Harman, MD;  Location[facility];  Service: Vascular;  Laterality: Left;   AORTIC VALVE REPLACEMENT  09/30/2016   Procedure: TRANSCATHETER AORTIC VALVE REPAIR   APPLICATION OF WOUND VAC Bilateral 08/14/2022   Procedure: APPLICATION OF WOUND VAC;  Surgeon: Maeola Harman, MD;  Location: [facility] OR;  Service: Vascular;  Laterality: Bilateral;   BLADDER SURGERY     CATARACT EXTRACTION, BILATERAL     CHOLECYSTECTOMY     COLONOSCOPY     CORONARY ANGIOPLASTY WITH STENT PLACEMENT  03/20/2014   Procedure: CORONARY ANGIOPLASTY WITH STENT PLACEMENT; Location: [facility]; Surgeon: Willene Hatchet, MD   CORONARY ANGIOPLASTY WITH STENT PLACEMENT Left 01/25/2016   Procedure: CORONARY ANGIOPLASTY WITH STENT PLACEMENT; Location: [facility]; Surgeon: Willene Hatchet, MD   ENDARTERECTOMY FEMORAL Bilateral 08/04/2022   Procedure: BILATERAL FEMORAL ENDARTERECTOMY;  Surgeon: Maeola Harman, MD;  Location: [facility] OR;  Service: Vascular;  Laterality: Bilateral;   FASCIECTOMY Bilateral 08/04/2022   Procedure: BILATERAL LOWER EXTREMITY FASCIECTOMIES;  Surgeon: Maeola Harman, MD;  Location: [facility] OR;  Service: Vascular;  Laterality: Bilateral;   FOOT SURGERY Right    GROIN DEBRIDEMENT Bilateral 08/14/2022   Procedure: GROIN DEBRIDEMENT;  Surgeon: Maeola Harman, MD;  Location: [facility] OR;  Service: Vascular;  Laterality: Bilateral;   GROIN DEBRIDEMENT Bilateral 09/16/2022   Procedure: GROIN DEBRIDEMENT POSSIBLE VAC PLACMENT;  Surgeon: Victorino Sparrow, MD;  Location[facility] OR;  Service: Vascular;  Laterality: Bilateral;  HEMORRHOID SURGERY     INSERTION OF ILIAC STENT Bilateral 08/04/2022   Procedure: INSERTION OF BILATERAL ILIAC STENTS;  Surgeon: Maeola Harman, MD;  Location: [facility] OR;  Service: Vascular;  Laterality: Bilateral;   IRRIGATION AND DEBRIDEMENT HEMATOMA Left 07/08/2014   GROIN   LOWER EXTREMITY ANGIOGRAM Bilateral 08/04/2022   Procedure: LOWER EXTREMITY ANGIOGRAM;  Surgeon: Maeola Harman, MD;  Location: [facility] OR;  Service: Vascular;  Laterality: Bilateral;   LOWER EXTREMITY ANGIOGRAPHY Right 04/12/2021   Procedure: Lower Extremity Angiography;  Surgeon: Renford Dills, MD;  Location: [facility] INVASIVE CV LAB;  Service: Cardiovascular;  Laterality: Right;   LOWER EXTREMITY ANGIOGRAPHY Left 05/28/2021   Procedure: LOWER EXTREMITY ANGIOGRAPHY;  Surgeon: Renford Dills, MD;  Location: [facility] INVASIVE CV LAB;  Service: Cardiovascular;  Laterality: Left;   LOWER EXTREMITY ANGIOGRAPHY Left 03/25/2022   Procedure: Lower Extremity Angiography;  Surgeon: Renford Dills, MD;  Location: [facility] INVASIVE CV LAB;  Service: Cardiovascular;  Laterality: Left;   PATCH ANGIOPLASTY  08/04/2022   Procedure: LEFT FEMORAL PATCH ANGIOPLASTY USING XENOSURE BIOLOGIC PATCH;  Surgeon: Maeola Harman, MD;  Location: [facility] OR;  Service: Vascular;;    PSEUDOANEURYSM REPAIR Left 06/30/2014   FEMORAL ARTERY   RIGHT/LEFT HEART CATH AND CORONARY ANGIOGRAPHY Bilateral 09/29/2016   Procedure: RIGHT/LEFT HEART CATH AND CORONARY ANGIOGRAPHY; Location [facility]; Surgeon: Willene Hatchet, MD   THROMBECTOMY FEMORAL ARTERY Bilateral 08/04/2022   Procedure: BILATERAL LOWER EXTREMITY THROMBECTOMY;  Surgeon: Maeola Harman, MD;  Location[facility] OR;  Service: Vascular;  Laterality: Bilateral;   TUBAL LIGATION     VISCERAL ANGIOGRAPHY N/A 11/13/2020   Procedure: VISCERAL ANGIOGRAPHY;  Surgeon: Renford Dills, MD;  Location: [facility] INVASIVE CV LAB;  Service: Cardiovascular;  Laterality: N/A;    Physical  Exam: Patient intubated and sedated endotracheal tube in position.  Anterior neck no previous scar noted.   A/P: Failure to extubate.  Family at bedside including daughter 2 grandsons and second daughter on the phone.  Nursing and respiratory therapy also present.  I had a long discussion with the family today, the daughters have decided that they do not want to proceed with tracheostomy tube at present.  They do not feel that she could survive surgery, and that the patient does not want a tracheostomy.  They would like to leave her on the ventilator and keep her comfortable until she passes away.  I have told him should they change their minds, they can let us know and we can contemplate tracheostomy in the future.  Until then ENT will sign off.  Both nursing and respiratory therapy were present to witness their wishes.   ICU time approximately 30 minutes   Davina Poke 01/18/2023 12:15 PM

## 2023-01-18 NOTE — IPAL (Signed)
  Interdisciplinary Goals of Care Family Meeting   Date carried out: 01/18/2023  Location of the meeting: Phone conference  Member's involved: Physician, Bedside Registered Nurse, and Family Member or next of kin    GOALS OF CARE DISCUSSION  The Clinical status was relayed to family in detail- Daughters over the phone  Updated and notified of patients medical condition- Patient remains unresponsive and will not open eyes to command.   Patient is having a weak cough and struggling to remove secretions.   Patient with increased WOB and using accessory muscles to breathe Explained to family course of therapy and the modalities   Patient with Progressive multiorgan failure with a very high probablity of a very minimal chance of meaningful recovery despite all aggressive and optimal medical therapy.  PATIENT REMAINS DNR  Family understands the situation.  They have consented and agreed to DNR/DNI and would like to proceed with Comfort care measures. They would like to leave patient on the ventilator, but proceed with comfort care measures and discontinue medications and lab draws, they do NOT Want [place] to poke and prod her body They want her to be in peace  Family are satisfied with Plan of action and management. All questions answered  Additional CC time 35 mins   Corynne Scibilia Santiago Glad, M.D.  [facility]  Medical Director [facility] Medical Director [facility] Cardio-Pulmonary Department

## 2023-01-18 NOTE — Plan of Care (Signed)
PMT shadowing. Spoke with CCM Kasa. PMT will sign off as goals are set for DNR with full scope treatment. Please reconsult if needs arise.

## 2023-01-18 NOTE — Progress Notes (Signed)
NAME:  Heather [name], MRN:  [id number], DOB:  October 25, 1950, LOS: 5 ADMISSION DATE:  01/20/2023 CHIEF COMPLAINT:  resp distress   History of Present Illness:  72 y.o. female with medical history significant of HFpEF, CAD s/p DES to LAD (2015), rheumatic heart disease s/p TAVR, T2DM, HTN, HLD, PAD s/p multiple stents and left AKA, COPD, who presents to the ED with c/o altered mental status.    Patient with severe metabolic encephalopathy patient's husband, there has been a slow decline over the last few weeks, however patient became suddenly altered yesterday evening and began to speak without sense  her right lower extremity has become paler over the last several weeks and he has not seen her move it recently but moves it for her intermittently.   ED course: On arrival to the ED, patient was hypertensive at 129/92 with heart rate of 149.  She was saturating at 100% on room air.  She was afebrile at 97.6.  Initial workup with VBG demonstrating pH of 7.14, pCO2 less than 18, glucose of 605, bicarb less than 7, potassium 3.2, creatinine 1.07 with GFR 56, albumin 2.8, WBC 21.6, lactic acid 2.9, INR 1.3, and urinalysis with ketonuria.  CT of the head with no acute intracranial abnormalities.  Chest x-ray with concerns for pneumonia.  Patient started on IV antibiotics for pneumonia.  Patient started on IV insulin for DKA.  Vascular surgery consulted for ischemic limb; patient started on IV heparin.  TRH contacted for admission, PCCM asked to Consult   Significant Hospital Events: Including procedures, antibiotic start and stop dates in addition to other pertinent events   5/21 ICU admission for acidosis and encephalopathy 5/22 remains on vent, remains critically ill, DNR status per family request 5/23 remains on vent, remains critically ill 5/24 remains mottled, on vent critically ill 5/25 remains on vent, remains critically ill, family consented to TRACH/PEG     Micro Data:  CULTURES  PENDING  Antimicrobials:   Antibiotics Given (last 72 hours)     Date/Time Action Medication Dose Rate   01/15/23 1224 New Bag/Given   vancomycin (VANCOREADY) IVPB 750 mg/150 mL 750 mg 150 mL/hr   01/15/23 1436 New Bag/Given   piperacillin-tazobactam (ZOSYN) IVPB 3.375 g 3.375 g 12.5 mL/hr   01/15/23 2135 New Bag/Given   piperacillin-tazobactam (ZOSYN) IVPB 3.375 g 3.375 g 12.5 mL/hr   01/16/23 0616 New Bag/Given   piperacillin-tazobactam (ZOSYN) IVPB 3.375 g 3.375 g 12.5 mL/hr   01/16/23 1334 New Bag/Given   vancomycin (VANCOREADY) IVPB 750 mg/150 mL 750 mg 150 mL/hr   01/16/23 1553 New Bag/Given   piperacillin-tazobactam (ZOSYN) IVPB 3.375 g 3.375 g 12.5 mL/hr   01/16/23 2227 New Bag/Given   piperacillin-tazobactam (ZOSYN) IVPB 3.375 g 3.375 g 12.5 mL/hr   01/17/23 0507 New Bag/Given   piperacillin-tazobactam (ZOSYN) IVPB 3.375 g 3.375 g 12.5 mL/hr   01/17/23 1057 New Bag/Given   vancomycin (VANCOREADY) IVPB 750 mg/150 mL 750 mg 150 mL/hr   01/17/23 1615 New Bag/Given   DAPTOmycin (CUBICIN) 450 mg in sodium chloride 0.9 % IVPB 450 mg 118 mL/hr            Interim History / Subjective:  Critical limb ischemia Remains Mottled from waist down Severe resp failure, failed SBT  Remains on vent Cyanosis from waist down Critically ill Prognosis is poor and grave  Vent Mode: PRVC FiO2 (%):  [30 %] 30 % Set Rate:  [20 bmp] 20 bmp Vt Set:  [450 mL] 450 mL PEEP:  [  5 cmH20] 5 cmH20 Plateau Pressure:  [17 cmH20-19 cmH20] 19 cmH20     Objective   Blood pressure 122/80, pulse 63, temperature (!) 101.5 F (38.6 C), resp. rate 20, height 5' (1.524 m), weight 46 kg, SpO2 100 %.    Vent Mode: PRVC FiO2 (%):  [30 %] 30 % Set Rate:  [20 bmp] 20 bmp Vt Set:  [450 mL] 450 mL PEEP:  [5 cmH20] 5 cmH20 Plateau Pressure:  [17 cmH20-19 cmH20] 19 cmH20   Intake/Output Summary (Last 24 hours) at 01/18/2023 0715 Last data filed at 01/18/2023 0600 Gross per 24 hour  Intake 1019.32  ml  Output 1190 ml  Net -170.68 ml    Filed Weights   01/14/2023 0926 01/17/23 0500 01/18/23 0500  Weight: 45.6 kg 45.7 kg 46 kg     REVIEW OF SYSTEMS  PATIENT IS UNABLE TO PROVIDE COMPLETE REVIEW OF SYSTEMS DUE TO SEVERE CRITICAL ILLNESS   PHYSICAL EXAMINATION:  GENERAL:critically ill appearing, +resp distress EYES: Pupils equal, round, reactive to light.  No scleral icterus.  MOUTH: Moist mucosal membrane. INTUBATED NECK: Supple.  PULMONARY: Lungs clear to auscultation, +rhonchi, +wheezing CARDIOVASCULAR: S1 and S2.  Regular rate and rhythm GASTROINTESTINAL: Soft, nontender, -distended. Positive bowel sounds.  MUSCULOSKELETAL:+cyanosis, mottled skin, left AKA NEUROLOGIC: obtunded,sedated SKIN:abnormal, cold to touch, Capillary refill delayed  Weak pulses RT foot  Labs/imaging that I havepersonally reviewed  (right click and "Reselect all SmartList Selections" daily)      ASSESSMENT AND PLAN SYNOPSIS  72 yo white female with multiple medical issues with severe metabolic acidosis due to severe DKA with severe metabolic encephalopathy with acute cyanosis of lower half of her body mottled skin with critical limb ischemia active smoker, possible aspiration pneumonia  Severe ACUTE Hypoxic and Hypercapnic Respiratory Failure -continue Mechanical Ventilator support -Wean Fio2 and PEEP as tolerated -VAP/VENT bundle implementation - Wean PEEP & FiO2 as tolerated, maintain SpO2 > 88% - Head of bed elevated 30 degrees, VAP protocol in place - Plateau pressures less than 30 cm H20  - Intermittent chest x-ray & ABG PRN - Ensure adequate pulmonary hygiene  -will NOT perform SAT/SBT, plan for [facility]   HIGH RISK FOR CARDIAC ARREST  CARDIAC ICU monitoring  RENAL SEVERE ACIDOSIS ASSESS FOR BICARB INFUSION  Intake/Output Summary (Last 24 hours) at 01/18/2023 0715 Last data filed at 01/18/2023 0600 Gross per 24 hour  Intake 1019.32 ml  Output 1190 ml  Net -170.68 ml       NEUROLOGY ACUTE METABOLIC ENCEPHALOPATHY Plan for [facility] Patient in severe Pain   SEPTIC shock SOURCE-LIMB ISCHEMIA, PNEUMONIA -use vasopressors to keep MAP>65 as needed  INFECTIOUS DISEASE -continue antibiotics as prescribed -follow up cultures   ENDO - ICU hypoglycemic\Hyperglycemia protocol -check FSBS per protocol   GI GI PROPHYLAXIS as indicated  NUTRITIONAL STATUS DIET-->TF's as tolerated Constipation protocol as indicated   ELECTROLYTES -follow labs as needed -replace as needed -pharmacy consultation and following   ACUTE ANEMIA- TRANSFUSE AS NEEDED CONSIDER TRANSFUSION  IF HGB<7      Best practice (right click and "Reselect all SmartList Selections" daily)  Diet: NPO Central venous access:  N/A Arterial line:  N/A Foley:  N/A Mobility:  bed rest  Code Status: DNR Disposition:ICU  Labs   CBC: Recent Labs  Lab 01/20/2023 0828 01/14/23 0554 01/16/23 0935 01/17/23 1133 01/18/23 0538  WBC 21.6* 13.4* 13.5* 11.9* 8.3  NEUTROABS 17.7*  --   --   --   --   HGB 14.4 11.9* 10.9*  9.5* 8.8*  HCT 46.9* 34.7* 33.4* 30.0* 27.8*  MCV 90.2 81.8 85.2 87.7 87.1  PLT 337 207 160 PLATELET CLUMPS NOTED ON SMEAR, UNABLE TO ESTIMATE 148*     Basic Metabolic Panel: Recent Labs  Lab 01/22/2023 1359 01/17/2023 1603 01/14/23 1440 01/15/23 0657 01/16/23 0935 01/16/23 1242 01/16/23 1630 01/17/23 1133 01/17/23 1639 01/18/23 0538  NA 138   < > 134* 132* 132*  --   --  129*  --  131*  K 4.3   < > 3.5 3.9 4.5  --   --  4.3  --  3.6  CL 113*   < > 101 103 106  --   --  107  --  108  CO2 <7*   < > 27 24 21*  --   --  16*  --  19*  GLUCOSE 377*   < > 70 101* 130*  --   --  170*  --  159*  BUN 19   < > 10 10 9   --   --  12  --  13  CREATININE 0.86   < > 0.43* 0.44 0.53  --   --  0.54  --  0.43*  CALCIUM 7.8*   < > 6.6* 6.9* 7.3*  --   --  6.8*  --  7.1*  MG 1.5*  --   --  1.7 1.9  --   --   --   --   --   PHOS 3.3  --   --   --   --  2.4* 2.6 2.5 2.5   --    < > = values in this interval not displayed.    GFR: Estimated Creatinine Clearance: 47.7 mL/min (A) (by C-G formula based on SCr of 0.43 mg/dL (L)). Recent Labs  Lab 01/12/2023 0828 12/31/2022 0955 01/14/23 0027 01/14/23 0554 01/16/23 0935 01/17/23 1133 01/18/23 0538  WBC 21.6*  --   --  13.4* 13.5* 11.9* 8.3  LATICACIDVEN 2.9* 2.5* 2.9*  --   --   --   --      Liver Function Tests: Recent Labs  Lab 01/17/2023 0828 01/14/23 0554  AST 23 168*  ALT 18 32  ALKPHOS 104 75  BILITOT 2.3* 0.9  PROT 6.3* 4.5*  ALBUMIN 2.8* 1.9*    No results for input(s): "LIPASE", "AMYLASE" in the last 168 hours. No results for input(s): "AMMONIA" in the last 168 hours.  ABG    Component Value Date/Time   PHART 7.5 (H) 01/19/2023 2029   PCO2ART 35 01/20/2023 2029   PO2ART 127 (H) 01/09/2023 2029   HCO3 27.3 01/11/2023 2029   TCO2 22 08/08/2022 0434   ACIDBASEDEF 23.3 (H) 01/16/2023 0955   O2SAT 99.2 01/18/2023 2029     Coagulation Profile: Recent Labs  Lab 12/31/2022 0828 01/14/23 0554 01/17/23 1639  INR 1.3* 1.1 1.1     Cardiac Enzymes: Recent Labs  Lab 01/17/23 1133  CKTOTAL 5,830*    HbA1C: Hgb A1c MFr Bld  Date/Time Value Ref Range Status  01/12/2023 01:59 PM 13.3 (H) 4.8 - 5.6 % Final    Comment:    (NOTE)         Prediabetes: 5.7 - 6.4         Diabetes: >6.4         Glycemic control for adults with diabetes: <7.0   08/05/2022 04:00 AM 12.9 (H) 4.8 - 5.6 % Final    Comment:    (NOTE)  Prediabetes: 5.7 - 6.4         Diabetes: >6.4         Glycemic control for adults with diabetes: <7.0     CBG: Recent Labs  Lab 01/17/23 1119 01/17/23 1600 01/17/23 1937 01/17/23 2335 01/18/23 0352  GLUCAP 150* 191* 174* 151* 162*    Home Medications  Prior to Admission medications   Medication Sig Start Date End Date Taking? Authorizing Provider  acetaminophen (TYLENOL) 325 MG tablet Take 2 tablets (650 mg total) by mouth every 6 (six) hours as needed  for mild pain (or Fever >/= 101). 09/29/22  Yes Leroy Sea, MD  albuterol (VENTOLIN HFA) 108 (90 Base) MCG/ACT inhaler Inhale 2 puffs into the lungs every 4 (four) hours as needed for wheezing or shortness of breath.   Yes [provider]  amLODipine (NORVASC) 10 MG tablet Take 1 tablet (10 mg total) by mouth daily. 09/29/22 09/29/23 Yes Leroy Sea, MD  atorvastatin (LIPITOR) 80 MG tablet Take 80 mg by mouth at bedtime.   Yes [provider]  carvedilol (COREG) 6.25 MG tablet Take 1 tablet (6.25 mg total) by mouth 2 (two) times daily with a meal. 09/22/22 01/14/2023 Yes Zigmund Daniel., MD  cetirizine (ZYRTEC) 10 MG tablet Take 10 mg by mouth daily.   Yes [provider]  cholecalciferol (VITAMIN D3) 25 MCG (1000 UNIT) tablet Take 1,000 Units by mouth daily.   Yes [provider]  ciprofloxacin (CIPRO) 500 MG tablet Take 500 mg by mouth 2 (two) times daily.   Yes [provider]  clopidogrel (PLAVIX) 75 MG tablet Take 1 tablet (75 mg total) by mouth daily. 11/15/20  Yes Regalado, Belkys A, MD  diclofenac Sodium (VOLTAREN) 1 % GEL Apply 2 g topically 4 (four) times daily as needed. 10/31/22  Yes Enedina Finner, MD  escitalopram (LEXAPRO) 10 MG tablet Take 1 tablet (10 mg total) by mouth daily. 09/22/22 01/07/2023 Yes Zigmund Daniel., MD  hydrALAZINE (APRESOLINE) 50 MG tablet Take 1 tablet (50 mg total) by mouth 3 (three) times daily. 09/29/22 12/30/2022 Yes Leroy Sea, MD  insulin aspart (NOVOLOG) 100 UNIT/ML FlexPen Before each meal 3 times a day, 140-199 - 2 units, 200-250 - 4 units, 251-299 - 8 units,  300-349 - 10 units,  350 or above 12 units. I 09/29/22  Yes Leroy Sea, MD  insulin aspart (NOVOLOG) 100 UNIT/ML injection Inject 3 Units into the skin 3 (three) times daily with meals. Hold for NPO or consuming less than 50% of meals Patient taking differently: Inject 3 Units into the skin See admin instructions. Inject 3 units  subcutaneously with meals. HOLD for NPO or consuming < 50% of meals. 09/22/22  Yes Zigmund Daniel., MD  insulin glargine (LANTUS) 100 UNIT/ML injection Inject 0.1 mLs (10 Units total) into the skin 2 (two) times daily. 09/29/22  Yes Leroy Sea, MD  levETIRAcetam (KEPPRA) 500 MG tablet Take 1 tablet (500 mg total) by mouth 2 (two) times daily. 10/06/22  Yes Marinda Elk, MD  mirtazapine (REMERON) 7.5 MG tablet Take 1 tablet (7.5 mg total) by mouth at bedtime. 09/22/22 01/15/2023 Yes Zigmund Daniel., MD  Multiple Vitamins-Minerals (MULTIVITAMIN WITH MINERALS) tablet Take 1 tablet by mouth daily.   Yes [provider]  nicotine (NICODERM CQ - DOSED IN MG/24 HOURS) 14 mg/24hr patch Place 1 patch (14 mg total) onto the skin daily. 10/31/22  Yes Enedina Finner, MD  pantoprazole (PROTONIX) 40 MG tablet Take 1 tablet (40 mg total) by mouth daily. 09/22/22 01/23/2023 Yes Zigmund Daniel., MD  polyethylene glycol (MIRALAX / GLYCOLAX) 17 g packet Take 17 g by mouth daily. Titrate as needed for constipation. Patient taking differently: Take 17 g by mouth daily. 09/22/22  Yes Zigmund Daniel., MD  senna-docusate (SENOKOT-S) 8.6-50 MG tablet Take 1 tablet by mouth daily. 09/30/22  Yes Leroy Sea, MD  traZODone (DESYREL) 50 MG tablet Take 0.5 tablets (25 mg total) by mouth at bedtime as needed for sleep. 09/22/22 12/29/2022 Yes Zigmund Daniel., MD          PATIENT WITH VERY POOR PROGNOSIS PATIENT IN THE DYING PROCESS I ANTICIPATE PROLONGED ICU LOS     DVT/GI PRX  assessed I Assessed the need for Labs I Assessed the need for Foley I Assessed the need for Central Venous Line Family Discussion when available I Assessed the need for Mobilization I made an Assessment of medications to be adjusted accordingly Safety Risk assessment completed  CASE DISCUSSED IN MULTIDISCIPLINARY ROUNDS WITH ICU TEAM    Critical Care Time devoted to patient care services  described in this note is 55 minutes.  Critical care was necessary to treat /prevent imminent and life-threatening deterioration. Overall, patient is critically ill, prognosis is guarded.  Patient with Multiorgan failure and at high risk for cardiac arrest and death.    Lucie Leather, M.D.  [facility]  Medical Director [facility] Medical Director [facility] Cardio-Pulmonary Department

## 2023-01-19 DIAGNOSIS — I998 Other disorder of circulatory system: Secondary | ICD-10-CM | POA: Diagnosis not present

## 2023-01-19 DIAGNOSIS — Z7189 Other specified counseling: Secondary | ICD-10-CM

## 2023-01-19 LAB — CULTURE, BLOOD (ROUTINE X 2)
Culture: NO GROWTH
Special Requests: ADEQUATE

## 2023-01-19 NOTE — Consult Note (Signed)
Pt is now comfort care.

## 2023-01-19 NOTE — Progress Notes (Signed)
Nutrition Brief Note  Chart reviewed. Pt now transitioning to comfort care.  No further nutrition interventions planned at this time.  Please re-consult as needed.   Lenox Ladouceur W, RD, LDN, CDCES Registered Dietitian II Certified Diabetes Care and Education Specialist Please refer to [facility] for RD and/or RD on-call/weekend/after hours pager   

## 2023-01-19 NOTE — Progress Notes (Signed)
NAME:  Heather [name], MRN:  [id number], DOB:  07/23/1951, LOS: 6 ADMISSION DATE:  01/15/2023 CHIEF COMPLAINT:  resp distress   History of Present Illness:  72 y.o. female with medical history significant of HFpEF, CAD s/p DES to LAD (2015), rheumatic heart disease s/p TAVR, T2DM, HTN, HLD, PAD s/p multiple stents and left AKA, COPD, who presents to the ED with c/o altered mental status.    Patient with severe metabolic encephalopathy patient's husband, there has been a slow decline over the last few weeks, however patient became suddenly altered yesterday evening and began to speak without sense  her right lower extremity has become paler over the last several weeks and he has not seen her move it recently but moves it for her intermittently.   ED course: On arrival to the ED, patient was hypertensive at 129/92 with heart rate of 149.  She was saturating at 100% on room air.  She was afebrile at 97.6.  Initial workup with VBG demonstrating pH of 7.14, pCO2 less than 18, glucose of 605, bicarb less than 7, potassium 3.2, creatinine 1.07 with GFR 56, albumin 2.8, WBC 21.6, lactic acid 2.9, INR 1.3, and urinalysis with ketonuria.  CT of the head with no acute intracranial abnormalities.  Chest x-ray with concerns for pneumonia.  Patient started on IV antibiotics for pneumonia.  Patient started on IV insulin for DKA.  Vascular surgery consulted for ischemic limb; patient started on IV heparin.  TRH contacted for admission, PCCM asked to Consult   Significant Hospital Events: Including procedures, antibiotic start and stop dates in addition to other pertinent events   5/21 ICU admission for acidosis and encephalopathy 5/22 remains on vent, remains critically ill, DNR status per family request 5/23 remains on vent, remains critically ill 5/24 remains mottled, on vent critically ill 5/25 remains on vent, remains critically ill, family consented to TRACH/PEG     Micro Data:  CULTURES PENDING      Antimicrobials:   Antibiotics Given (last 72 hours)     Date/Time Action Medication Dose Rate   01/16/23 1334 New Bag/Given   vancomycin (VANCOREADY) IVPB 750 mg/150 mL 750 mg 150 mL/hr   01/16/23 1553 New Bag/Given   piperacillin-tazobactam (ZOSYN) IVPB 3.375 g 3.375 g 12.5 mL/hr   01/16/23 2227 New Bag/Given   piperacillin-tazobactam (ZOSYN) IVPB 3.375 g 3.375 g 12.5 mL/hr   01/17/23 0507 New Bag/Given   piperacillin-tazobactam (ZOSYN) IVPB 3.375 g 3.375 g 12.5 mL/hr   01/17/23 1057 New Bag/Given   vancomycin (VANCOREADY) IVPB 750 mg/150 mL 750 mg 150 mL/hr   01/17/23 1615 New Bag/Given   DAPTOmycin (CUBICIN) 450 mg in sodium chloride 0.9 % IVPB 450 mg 118 mL/hr            Interim History / Subjective:    GOALS OF CARE DISCUSSION   The Clinical status was relayed to family in detail- Daughters over the phone   Updated and notified of patients medical condition- Patient remains unresponsive and will not open eyes to command.   Patient is having a weak cough and struggling to remove secretions.   Patient with increased WOB and using accessory muscles to breathe Explained to family course of therapy and the modalities    Patient with Progressive multiorgan failure with a very high probablity of a very minimal chance of meaningful recovery despite all aggressive and optimal medical therapy.  PATIENT REMAINS DNR   Family understands the situation.   They have consented and agreed to  DNR/DNI and would like to proceed with Comfort care measures. They would like to leave patient on the ventilator, but proceed with comfort care measures and discontinue medications and lab draws, they do NOT Want [place] to poke and prod her body They want her to be in peace   Critical limb ischemia Remains Mottled from waist down Severe resp failure, failed SBT  Remains on vent Cyanosis from waist down Critically ill Prognosis is poor and grave    Vent Mode: PRVC FiO2 (%):  [30 %]  30 % Set Rate:  [20 bmp] 20 bmp Vt Set:  [450 mL] 450 mL PEEP:  [5 cmH20] 5 cmH20 Plateau Pressure:  [15 cmH20-16 cmH20] 16 cmH20     Objective   Blood pressure 125/71, pulse (!) 111, temperature (!) 100.5 F (38.1 C), resp. rate 20, height 5' (1.524 m), weight 46 kg, SpO2 99 %.    Vent Mode: PRVC FiO2 (%):  [30 %] 30 % Set Rate:  [20 bmp] 20 bmp Vt Set:  [450 mL] 450 mL PEEP:  [5 cmH20] 5 cmH20 Plateau Pressure:  [15 cmH20-16 cmH20] 16 cmH20   Intake/Output Summary (Last 24 hours) at 01/19/2023 0713 Last data filed at 01/19/2023 7829 Gross per 24 hour  Intake 427.15 ml  Output 900 ml  Net -472.85 ml    Filed Weights   01/05/2023 0926 01/17/23 0500 01/18/23 0500  Weight: 45.6 kg 45.7 kg 46 kg      REVIEW OF SYSTEMS  PATIENT IS UNABLE TO PROVIDE COMPLETE REVIEW OF SYSTEMS DUE TO SEVERE CRITICAL ILLNESS   PHYSICAL EXAMINATION:  GENERAL:critically ill appearing, +resp distress EYES: Pupils equal, round, reactive to light.  No scleral icterus.  MOUTH: Moist mucosal membrane. INTUBATED NECK: Supple.  PULMONARY: Lungs clear to auscultation, +rhonchi, +wheezing CARDIOVASCULAR: S1 and S2.  Regular rate and rhythm GASTROINTESTINAL: Soft, nontender, -distended. Positive bowel sounds.  MUSCULOSKELETAL:+cyanosis, mottled skin, left AKA NEUROLOGIC: obtunded,sedated SKIN:abnormal, cold to touch, Capillary refill delayed  Weak pulses RT foot  Labs/imaging that I havepersonally reviewed  (right click and "Reselect all SmartList Selections" daily)    Best practice (right click and "Reselect all SmartList Selections" daily)  Diet: NPO Central venous access:  N/A Arterial line:  N/A Foley:  N/A Mobility:  bed rest  Code Status: DNR-COMFORT CARE Disposition:ICU  Labs   CBC: Recent Labs  Lab 01/22/2023 0828 01/14/23 0554 01/16/23 0935 01/17/23 1133 01/18/23 0538  WBC 21.6* 13.4* 13.5* 11.9* 8.3  NEUTROABS 17.7*  --   --   --   --   HGB 14.4 11.9* 10.9* 9.5* 8.8*   HCT 46.9* 34.7* 33.4* 30.0* 27.8*  MCV 90.2 81.8 85.2 87.7 87.1  PLT 337 207 160 PLATELET CLUMPS NOTED ON SMEAR, UNABLE TO ESTIMATE 148*     Basic Metabolic Panel: Recent Labs  Lab 01/15/2023 1359 01/17/2023 1603 01/14/23 1440 01/15/23 0657 01/16/23 0935 01/16/23 1242 01/16/23 1630 01/17/23 1133 01/17/23 1639 01/18/23 0538  NA 138   < > 134* 132* 132*  --   --  129*  --  131*  K 4.3   < > 3.5 3.9 4.5  --   --  4.3  --  3.6  CL 113*   < > 101 103 106  --   --  107  --  108  CO2 <7*   < > 27 24 21*  --   --  16*  --  19*  GLUCOSE 377*   < > 70 101* 130*  --   --  170*  --  159*  BUN 19   < > 10 10 9   --   --  12  --  13  CREATININE 0.86   < > 0.43* 0.44 0.53  --   --  0.54  --  0.43*  CALCIUM 7.8*   < > 6.6* 6.9* 7.3*  --   --  6.8*  --  7.1*  MG 1.5*  --   --  1.7 1.9  --   --   --   --   --   PHOS 3.3  --   --   --   --  2.4* 2.6 2.5 2.5  --    < > = values in this interval not displayed.    GFR: Estimated Creatinine Clearance: 47.7 mL/min (A) (by C-G formula based on SCr of 0.43 mg/dL (L)). Recent Labs  Lab 01/02/2023 0828 01/09/2023 0955 01/14/23 0027 01/14/23 0554 01/16/23 0935 01/17/23 1133 01/18/23 0538  WBC 21.6*  --   --  13.4* 13.5* 11.9* 8.3  LATICACIDVEN 2.9* 2.5* 2.9*  --   --   --   --      Liver Function Tests: Recent Labs  Lab 01/05/2023 0828 01/14/23 0554  AST 23 168*  ALT 18 32  ALKPHOS 104 75  BILITOT 2.3* 0.9  PROT 6.3* 4.5*  ALBUMIN 2.8* 1.9*    No results for input(s): "LIPASE", "AMYLASE" in the last 168 hours. No results for input(s): "AMMONIA" in the last 168 hours.  ABG    Component Value Date/Time   PHART 7.5 (H) 01/18/2023 2029   PCO2ART 35 12/26/2022 2029   PO2ART 127 (H) 01/18/2023 2029   HCO3 27.3 01/17/2023 2029   TCO2 22 08/08/2022 0434   ACIDBASEDEF 23.3 (H) 01/05/2023 0955   O2SAT 99.2 01/07/2023 2029     Coagulation Profile: Recent Labs  Lab 12/25/2022 0828 01/14/23 0554 01/17/23 1639  INR 1.3* 1.1 1.1      Cardiac Enzymes: Recent Labs  Lab 01/17/23 1133 01/18/23 0528  CKTOTAL 5,830* 4,375*     HbA1C: Hgb A1c MFr Bld  Date/Time Value Ref Range Status  01/09/2023 01:59 PM 13.3 (H) 4.8 - 5.6 % Final    Comment:    (NOTE)         Prediabetes: 5.7 - 6.4         Diabetes: >6.4         Glycemic control for adults with diabetes: <7.0   08/05/2022 04:00 AM 12.9 (H) 4.8 - 5.6 % Final    Comment:    (NOTE)         Prediabetes: 5.7 - 6.4         Diabetes: >6.4         Glycemic control for adults with diabetes: <7.0     CBG: Recent Labs  Lab 01/17/23 1937 01/17/23 2335 01/18/23 0352 01/18/23 0756 01/18/23 1107  GLUCAP 174* 151* 162* 186* 193*    Home Medications  Prior to Admission medications   Medication Sig Start Date End Date Taking? Authorizing Provider  acetaminophen (TYLENOL) 325 MG tablet Take 2 tablets (650 mg total) by mouth every 6 (six) hours as needed for mild pain (or Fever >/= 101). 09/29/22  Yes Leroy Sea, MD  albuterol (VENTOLIN HFA) 108 (90 Base) MCG/ACT inhaler Inhale 2 puffs into the lungs every 4 (four) hours as needed for wheezing or shortness of breath.   Yes [provider]  amLODipine (NORVASC) 10 MG tablet Take  1 tablet (10 mg total) by mouth daily. 09/29/22 09/29/23 Yes Leroy Sea, MD  atorvastatin (LIPITOR) 80 MG tablet Take 80 mg by mouth at bedtime.   Yes [provider]  carvedilol (COREG) 6.25 MG tablet Take 1 tablet (6.25 mg total) by mouth 2 (two) times daily with a meal. 09/22/22 01/01/2023 Yes Zigmund Daniel., MD  cetirizine (ZYRTEC) 10 MG tablet Take 10 mg by mouth daily.   Yes [provider]  cholecalciferol (VITAMIN D3) 25 MCG (1000 UNIT) tablet Take 1,000 Units by mouth daily.   Yes [provider]  ciprofloxacin (CIPRO) 500 MG tablet Take 500 mg by mouth 2 (two) times daily.   Yes [provider]  clopidogrel (PLAVIX) 75 MG tablet Take 1 tablet (75 mg total) by mouth daily.  11/15/20  Yes Regalado, Belkys A, MD  diclofenac Sodium (VOLTAREN) 1 % GEL Apply 2 g topically 4 (four) times daily as needed. 10/31/22  Yes Enedina Finner, MD  escitalopram (LEXAPRO) 10 MG tablet Take 1 tablet (10 mg total) by mouth daily. 09/22/22 01/01/2023 Yes Zigmund Daniel., MD  hydrALAZINE (APRESOLINE) 50 MG tablet Take 1 tablet (50 mg total) by mouth 3 (three) times daily. 09/29/22 01/07/2023 Yes Leroy Sea, MD  insulin aspart (NOVOLOG) 100 UNIT/ML FlexPen Before each meal 3 times a day, 140-199 - 2 units, 200-250 - 4 units, 251-299 - 8 units,  300-349 - 10 units,  350 or above 12 units. I 09/29/22  Yes Leroy Sea, MD  insulin aspart (NOVOLOG) 100 UNIT/ML injection Inject 3 Units into the skin 3 (three) times daily with meals. Hold for NPO or consuming less than 50% of meals Patient taking differently: Inject 3 Units into the skin See admin instructions. Inject 3 units subcutaneously with meals. HOLD for NPO or consuming < 50% of meals. 09/22/22  Yes Zigmund Daniel., MD  insulin glargine (LANTUS) 100 UNIT/ML injection Inject 0.1 mLs (10 Units total) into the skin 2 (two) times daily. 09/29/22  Yes Leroy Sea, MD  levETIRAcetam (KEPPRA) 500 MG tablet Take 1 tablet (500 mg total) by mouth 2 (two) times daily. 10/06/22  Yes Marinda Elk, MD  mirtazapine (REMERON) 7.5 MG tablet Take 1 tablet (7.5 mg total) by mouth at bedtime. 09/22/22 12/24/2022 Yes Zigmund Daniel., MD  Multiple Vitamins-Minerals (MULTIVITAMIN WITH MINERALS) tablet Take 1 tablet by mouth daily.   Yes [provider]  nicotine (NICODERM CQ - DOSED IN MG/24 HOURS) 14 mg/24hr patch Place 1 patch (14 mg total) onto the skin daily. 10/31/22  Yes Enedina Finner, MD  pantoprazole (PROTONIX) 40 MG tablet Take 1 tablet (40 mg total) by mouth daily. 09/22/22 01/20/2023 Yes Zigmund Daniel., MD  polyethylene glycol (MIRALAX / GLYCOLAX) 17 g packet Take 17 g by mouth daily. Titrate as needed for  constipation. Patient taking differently: Take 17 g by mouth daily. 09/22/22  Yes Zigmund Daniel., MD  senna-docusate (SENOKOT-S) 8.6-50 MG tablet Take 1 tablet by mouth daily. 09/30/22  Yes Leroy Sea, MD  traZODone (DESYREL) 50 MG tablet Take 0.5 tablets (25 mg total) by mouth at bedtime as needed for sleep. 09/22/22 01/02/2023 Yes Zigmund Daniel., MD         ASSESSMENT AND PLAN SYNOPSIS  72 yo white female with multiple medical issues with severe metabolic acidosis due to severe DKA with severe metabolic encephalopathy with acute cyanosis of lower half of her body mottled  skin with critical limb ischemia active smoker, possible aspiration pneumonia  Patient is suffering and in the dying process Family has agreed for comfort care measures while patient remains on ventilator  Family has declined TRACH/PEG    PATIENT WITH VERY POOR PROGNOSIS PATIENT IN THE DYING PROCESS   Critical Care Time devoted to patient care services described in this note is 55 minutes.  Critical care was necessary to treat /prevent imminent and life-threatening deterioration. Overall, patient is critically ill, prognosis is guarded.  Patient with Multiorgan failure and at high risk for cardiac arrest and death.    Lucie Leather, M.D.  [facility]  Medical Director [facility] Medical Director [facility] Cardio-Pulmonary Department

## 2023-01-20 DIAGNOSIS — J9601 Acute respiratory failure with hypoxia: Secondary | ICD-10-CM | POA: Diagnosis not present

## 2023-01-20 DIAGNOSIS — E874 Mixed disorder of acid-base balance: Secondary | ICD-10-CM | POA: Diagnosis not present

## 2023-01-20 DIAGNOSIS — I70222 Atherosclerosis of native arteries of extremities with rest pain, left leg: Secondary | ICD-10-CM | POA: Diagnosis not present

## 2023-01-20 DIAGNOSIS — G9340 Encephalopathy, unspecified: Secondary | ICD-10-CM | POA: Diagnosis not present

## 2023-01-20 LAB — CULTURE, BLOOD (ROUTINE X 2): Special Requests: ADEQUATE

## 2023-01-20 MED ORDER — DOCUSATE SODIUM 50 MG/5ML PO LIQD
100.0000 mg | Freq: Two times a day (BID) | ORAL | Status: DC
  Administered 2023-01-21: 100 mg
  Filled 2023-01-20: qty 10

## 2023-01-20 MED ORDER — HYDROMORPHONE HCL-NACL 50-0.9 MG/50ML-% IV SOLN
0.5000 mg/h | INTRAVENOUS | Status: DC
  Administered 2023-01-20: 0.5 mg/h via INTRAVENOUS
  Administered 2023-01-20: 4 mg/h via INTRAVENOUS
  Filled 2023-01-20 (×4): qty 50

## 2023-01-20 MED ORDER — HYDROMORPHONE BOLUS VIA INFUSION
0.5000 mg | INTRAVENOUS | Status: DC | PRN
  Administered 2023-01-20: 2 mg via INTRAVENOUS
  Administered 2023-01-21: 4 mg via INTRAVENOUS

## 2023-01-20 MED ORDER — HYDROMORPHONE HCL 1 MG/ML IJ SOLN
2.0000 mg | INTRAMUSCULAR | Status: DC | PRN
  Filled 2023-01-20: qty 2

## 2023-01-20 MED ORDER — POLYETHYLENE GLYCOL 3350 17 G PO PACK: 17.0000 g | PACK | Freq: Every day | ORAL | Status: DC

## 2023-01-20 NOTE — Progress Notes (Signed)
NAME:  Heather [name], MRN:  [id number], DOB:  02/23/1951, LOS: 7 ADMISSION DATE:  01/22/2023, CHIEF COMPLAINT:  Acute Limb Ischemia   History of Present Illness:   72 y.o. female with medical history significant of HFpEF, CAD s/p DES to LAD (2015), rheumatic heart disease s/p TAVR, T2DM, HTN, HLD, PAD s/p multiple stents and left AKA, COPD, who presents to the ED with c/o altered mental status.    Patient with severe metabolic encephalopathy on presentation. Husband reported a slow decline, with acute worsening of mental status the day prior to presentation. Her right lower extremity has become paler over the last several weeks and he has not seen her move it recently but moves it for her intermittently.   ED course: On arrival to the ED, patient was hypertensive at 129/92 with heart rate of 149.  She was saturating at 100% on room air.  She was afebrile at 97.6.  Initial workup with VBG demonstrating pH of 7.14, pCO2 less than 18, glucose of 605, bicarb less than 7, potassium 3.2, creatinine 1.07 with GFR 56, albumin 2.8, WBC 21.6, lactic acid 2.9, INR 1.3, and urinalysis with ketonuria.  CT of the head with no acute intracranial abnormalities.  Chest x-ray with concerns for pneumonia.  Patient started on IV antibiotics for pneumonia.  Patient started on IV insulin for DKA.  Vascular surgery consulted for ischemic limb; patient started on IV heparin.  [facility] contacted for admission, PCCM asked to Consult  Significant Hospital Events: Including procedures, antibiotic start and stop dates in addition to other pertinent events   5/21 ICU admission for acidosis and encephalopathy 5/22 remains on vent, remains critically ill, DNR status per family request 5/23 remains on vent, remains critically ill 5/24 remains mottled, on vent critically ill 5/25 remains on vent, remains critically ill, family consented to TRACH/PEG 5/28 remains on the vent, goals of care conversation yesterday, now comfort measures  only  Interim History / Subjective:  Opens eyes to commands, nods affirmative when asked if she's in pain  Objective   Blood pressure (!) 120/59, pulse (!) 117, temperature 99.9 F (37.7 C), resp. rate 20, height 5' (1.524 m), weight 46 kg, SpO2 97 %.    Vent Mode: PRVC FiO2 (%):  [28 %-30 %] 28 % Set Rate:  [20 bmp] 20 bmp Vt Set:  [450 mL] 450 mL PEEP:  [5 cmH20] 5 cmH20 Plateau Pressure:  [20 cmH20-25 cmH20] 20 cmH20   Intake/Output Summary (Last 24 hours) at 01/20/2023 0805 Last data filed at 01/20/2023 4098 Gross per 24 hour  Intake 627.27 ml  Output 475 ml  Net 152.27 ml   Filed Weights   01/17/2023 0926 01/17/23 0500 01/18/23 0500  Weight: 45.6 kg 45.7 kg 46 kg    Examination: Physical Exam Constitutional:      Appearance: She is ill-appearing, toxic-appearing and diaphoretic.  HENT:     Mouth/Throat:     Comments: ETT in place Cardiovascular:     Rate and Rhythm: Normal rate and regular rhythm.  Pulmonary:     Comments: Ventilated breath sounds bilaterally Abdominal:     General: There is distension.     Palpations: Abdomen is soft.  Musculoskeletal:     Comments: Left AKA, bullae noted at the stump. right extremity pale and cold to touch, no palpable pulses  Neurological:     Mental Status: She is disoriented.     Assessment & Plan:   72 year old female with history of multiple medical  problems presented to the hospital with acute limb ischemia, goals of care changed yesterday to comfort measures only.  Neurology #Severe Pain  Comfort measures in place, patient on midazolam for anxiety and was on fentanyl for analgesia (250 mcg/hour). Will switch to a hydromorphone gtt today.  Cardiovascular  Comfort measures only, d/c telemetry  Pulmonary #Acute Hypoxic Respiratory Failure  Intubated in the setting of critical illness, metabolic encephalopathy, and acute limb ischemia. Discussions had regarding trach, but family elected to not proceed. Now on  comfort measures only, but they would like her to remain intubated and comfortable. Maintained on ventilator with minimal settings.  Gastrointestinal  Comfort measures only. D/c tube feeds  Renal  Comfort measures only, discontinued blood draws. Urinary catheter in for comfort.  Endocrine  Did present with DKA, now comfort measures only. D/c glucose checks.  Hem/Onc  Comfort measures only, discontinued DVT prophylaxis  ID  Blood cultures with multiple organisms, include VRE. Was on antibiotics, now discontinued and goals of care changed to comfort measures only.  MSK #Acute limb ischemia  Hydromorphone gtt for analgesia.   Best Practice (right click and "Reselect all SmartList Selections" daily)   Diet/type: NPO DVT prophylaxis: not indicated GI prophylaxis: N/A Lines: N/A Foley:  Yes, and it is still needed Code Status:  CMO Last date of multidisciplinary goals of care discussion [01/20/2023]  Labs   CBC: Recent Labs  Lab 12/27/2022 0828 01/14/23 0554 01/16/23 0935 01/17/23 1133 01/18/23 0538  WBC 21.6* 13.4* 13.5* 11.9* 8.3  NEUTROABS 17.7*  --   --   --   --   HGB 14.4 11.9* 10.9* 9.5* 8.8*  HCT 46.9* 34.7* 33.4* 30.0* 27.8*  MCV 90.2 81.8 85.2 87.7 87.1  PLT 337 207 160 PLATELET CLUMPS NOTED ON SMEAR, UNABLE TO ESTIMATE 148*    Basic Metabolic Panel: Recent Labs  Lab 01/22/2023 1359 01/02/2023 1603 01/14/23 1440 01/15/23 0657 01/16/23 0935 01/16/23 1242 01/16/23 1630 01/17/23 1133 01/17/23 1639 01/18/23 0538  NA 138   < > 134* 132* 132*  --   --  129*  --  131*  K 4.3   < > 3.5 3.9 4.5  --   --  4.3  --  3.6  CL 113*   < > 101 103 106  --   --  107  --  108  CO2 <7*   < > 27 24 21*  --   --  16*  --  19*  GLUCOSE 377*   < > 70 101* 130*  --   --  170*  --  159*  BUN 19   < > 10 10 9   --   --  12  --  13  CREATININE 0.86   < > 0.43* 0.44 0.53  --   --  0.54  --  0.43*  CALCIUM 7.8*   < > 6.6* 6.9* 7.3*  --   --  6.8*  --  7.1*  MG 1.5*  --   --   1.7 1.9  --   --   --   --   --   PHOS 3.3  --   --   --   --  2.4* 2.6 2.5 2.5  --    < > = values in this interval not displayed.   GFR: Estimated Creatinine Clearance: 47.7 mL/min (A) (by C-G formula based on SCr of 0.43 mg/dL (L)). Recent Labs  Lab 01/20/2023 7829 12/26/2022 5621 01/14/23 0027 01/14/23 0554 01/16/23 0935  01/17/23 1133 01/18/23 0538  WBC 21.6*  --   --  13.4* 13.5* 11.9* 8.3  LATICACIDVEN 2.9* 2.5* 2.9*  --   --   --   --     Liver Function Tests: Recent Labs  Lab 12/24/2022 0828 01/14/23 0554  AST 23 168*  ALT 18 32  ALKPHOS 104 75  BILITOT 2.3* 0.9  PROT 6.3* 4.5*  ALBUMIN 2.8* 1.9*   No results for input(s): "LIPASE", "AMYLASE" in the last 168 hours. No results for input(s): "AMMONIA" in the last 168 hours.  ABG    Component Value Date/Time   PHART 7.5 (H) 01/12/2023 2029   PCO2ART 35 01/06/2023 2029   PO2ART 127 (H) 12/30/2022 2029   HCO3 27.3 01/03/2023 2029   TCO2 22 08/08/2022 0434   ACIDBASEDEF 23.3 (H) 01/06/2023 0955   O2SAT 99.2 01/04/2023 2029     Coagulation Profile: Recent Labs  Lab 01/03/2023 0828 01/14/23 0554 01/17/23 1639  INR 1.3* 1.1 1.1    Cardiac Enzymes: Recent Labs  Lab 01/17/23 1133 01/18/23 0528  CKTOTAL 5,830* 4,375*    HbA1C: Hgb A1c MFr Bld  Date/Time Value Ref Range Status  01/11/2023 01:59 PM 13.3 (H) 4.8 - 5.6 % Final    Comment:    (NOTE)         Prediabetes: 5.7 - 6.4         Diabetes: >6.4         Glycemic control for adults with diabetes: <7.0   08/05/2022 04:00 AM 12.9 (H) 4.8 - 5.6 % Final    Comment:    (NOTE)         Prediabetes: 5.7 - 6.4         Diabetes: >6.4         Glycemic control for adults with diabetes: <7.0     CBG: Recent Labs  Lab 01/17/23 1937 01/17/23 2335 01/18/23 0352 01/18/23 0756 01/18/23 1107  GLUCAP 174* 151* 162* 186* 193*    Review of Systems:   Unable to obtain  Past Medical History:  She,  has a past medical history of (HFpEF) heart failure with  preserved ejection fraction (HCC), Angina pectoris (HCC), Anxiety, Aortic atherosclerosis (HCC), Basal cell carcinoma, Bilateral carotid artery disease (HCC), Bipolar disorder (HCC), Cervicalgia, Chronic mesenteric ischemia (HCC), COPD (chronic obstructive pulmonary disease) (HCC), Coronary artery disease (03/20/2014), DDD (degenerative disc disease), cervical, Depression, Diabetic polyneuropathy (HCC), GERD (gastroesophageal reflux disease), Hyperlipidemia, Hypertension, Long term current use of antithrombotics/antiplatelets, Memory loss, NSTEMI (non-ST elevated myocardial infarction) (HCC) (07/2016), PAD (peripheral artery disease) (HCC), Respiratory failure with hypoxia (HCC), 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   Surgical History:   Past Surgical History:  Procedure Laterality Date   AMPUTATION Left 08/14/2022   Procedure: ABOVE KNEE AMPUTATION;  Surgeon: Maeola Harman, MD;  Location: [facility] OR;  Service: Vascular;  Laterality: Left;   AORTIC VALVE REPLACEMENT  09/30/2016   Procedure: TRANSCATHETER AORTIC VALVE REPAIR   APPLICATION OF WOUND VAC Bilateral 08/14/2022   Procedure: APPLICATION OF WOUND VAC;  Surgeon: Maeola Harman, MD;  Location: [facility] OR;  Service: Vascular;  Laterality: Bilateral;   BLADDER SURGERY     CATARACT EXTRACTION, BILATERAL     CHOLECYSTECTOMY     COLONOSCOPY     CORONARY ANGIOPLASTY WITH STENT PLACEMENT  03/20/2014   Procedure: CORONARY ANGIOPLASTY WITH STENT PLACEMENT; Location: [facility]; Surgeon: Willene Hatchet, MD  CORONARY ANGIOPLASTY WITH STENT PLACEMENT Left 01/25/2016   Procedure: CORONARY ANGIOPLASTY WITH STENT PLACEMENT; Location: [facility]; Surgeon: Willene Hatchet, MD   ENDARTERECTOMY FEMORAL Bilateral 08/04/2022   Procedure: BILATERAL FEMORAL ENDARTERECTOMY;  Surgeon: Maeola Harman, MD;  Location:  [facility] OR;  Service: Vascular;  Laterality: Bilateral;   FASCIECTOMY Bilateral 08/04/2022   Procedure: BILATERAL LOWER EXTREMITY FASCIECTOMIES;  Surgeon: Maeola Harman, MD;  Location: [facility] OR;  Service: Vascular;  Laterality: Bilateral;   FOOT SURGERY Right    GROIN DEBRIDEMENT Bilateral 08/14/2022   Procedure: GROIN DEBRIDEMENT;  Surgeon: Maeola Harman, MD;  Location[facility] OR;  Service: Vascular;  Laterality: Bilateral;   GROIN DEBRIDEMENT Bilateral 09/16/2022   Procedure: GROIN DEBRIDEMENT POSSIBLE VAC PLACMENT;  Surgeon: Victorino Sparrow, MD;  Location: [facility] OR;  Service: Vascular;  Laterality: Bilateral;   HEMORRHOID SURGERY     INSERTION OF ILIAC STENT Bilateral 08/04/2022   Procedure: INSERTION OF BILATERAL ILIAC STENTS;  Surgeon: Maeola Harman, MD;  Location: [facility] OR;  Service: Vascular;  Laterality: Bilateral;   IRRIGATION AND DEBRIDEMENT HEMATOMA Left 07/08/2014   GROIN   LOWER EXTREMITY ANGIOGRAM Bilateral 08/04/2022   Procedure: LOWER EXTREMITY ANGIOGRAM;  Surgeon: Maeola Harman, MD;  Location: [facility] OR;  Service: Vascular;  Laterality: Bilateral;   LOWER EXTREMITY ANGIOGRAPHY Right 04/12/2021   Procedure: Lower Extremity Angiography;  Surgeon: Renford Dills, MD;  Location: [facility] INVASIVE CV LAB;  Service: Cardiovascular;  Laterality: Right;   LOWER EXTREMITY ANGIOGRAPHY Left 05/28/2021   Procedure: LOWER EXTREMITY ANGIOGRAPHY;  Surgeon: Renford Dills, MD;  Location: [facility] INVASIVE CV LAB;  Service: Cardiovascular;  Laterality: Left;   LOWER EXTREMITY ANGIOGRAPHY Left 03/25/2022   Procedure: Lower Extremity Angiography;  Surgeon: Renford Dills, MD;  Location: [facility] INVASIVE CV LAB;  Service: Cardiovascular;  Laterality: Left;   PATCH ANGIOPLASTY  08/04/2022   Procedure: LEFT FEMORAL PATCH ANGIOPLASTY USING XENOSURE BIOLOGIC PATCH;  Surgeon: Maeola Harman, MD;  Location: [facility] OR;  Service: Vascular;;   PSEUDOANEURYSM REPAIR Left  06/30/2014   FEMORAL ARTERY   RIGHT/LEFT HEART CATH AND CORONARY ANGIOGRAPHY Bilateral 09/29/2016   Procedure: RIGHT/LEFT HEART CATH AND CORONARY ANGIOGRAPHY; Location [facility]; Surgeon: Willene Hatchet, MD   THROMBECTOMY FEMORAL ARTERY Bilateral 08/04/2022   Procedure: BILATERAL LOWER EXTREMITY THROMBECTOMY;  Surgeon: Maeola Harman, MD;  Location: [facility] OR;  Service: Vascular;  Laterality: Bilateral;   TUBAL LIGATION     VISCERAL ANGIOGRAPHY N/A 11/13/2020   Procedure: VISCERAL ANGIOGRAPHY;  Surgeon: Renford Dills, MD;  Location: [facility] INVASIVE CV LAB;  Service: Cardiovascular;  Laterality: N/A;     Social History:   reports that she has been smoking cigarettes. She has a 72.00 pack-year smoking history. She has never used smokeless tobacco. She reports that she does not currently use alcohol. She reports that she does not currently use drugs.   Family History:  Her family history includes Mental illness in her mother. There is no history of Breast cancer.   Allergies Allergies  Allergen Reactions   Iodinated Contrast Media Hives   Tramadol Hcl Other (See Comments)    SEIZURE   Sulfa Antibiotics Hives   Shellfish Allergy Nausea And Vomiting and Rash    Scallops specifically      Home Medications  Prior to Admission medications   Medication Sig Start Date End Date Taking? Authorizing Provider  acetaminophen (TYLENOL) 325 MG tablet Take 2 tablets (650 mg total) by mouth every 6 (six) hours as needed for mild pain (or  Fever >/= 101). 09/29/22  Yes Leroy Sea, MD  albuterol (VENTOLIN HFA) 108 (90 Base) MCG/ACT inhaler Inhale 2 puffs into the lungs every 4 (four) hours as needed for wheezing or shortness of breath.   Yes [provider]  amLODipine (NORVASC) 10 MG tablet Take 1 tablet (10 mg total) by mouth daily. 09/29/22 09/29/23 Yes Leroy Sea, MD  atorvastatin (LIPITOR) 80 MG tablet Take 80 mg by mouth at bedtime.   Yes [provider]  carvedilol  (COREG) 6.25 MG tablet Take 1 tablet (6.25 mg total) by mouth 2 (two) times daily with a meal. 09/22/22 12/25/2022 Yes Zigmund Daniel., MD  cetirizine (ZYRTEC) 10 MG tablet Take 10 mg by mouth daily.   Yes [provider]  cholecalciferol (VITAMIN D3) 25 MCG (1000 UNIT) tablet Take 1,000 Units by mouth daily.   Yes [provider]  clopidogrel (PLAVIX) 75 MG tablet Take 1 tablet (75 mg total) by mouth daily. 11/15/20  Yes Regalado, Belkys A, MD  diclofenac Sodium (VOLTAREN) 1 % GEL Apply 2 g topically 4 (four) times daily as needed. 10/31/22  Yes Enedina Finner, MD  escitalopram (LEXAPRO) 10 MG tablet Take 1 tablet (10 mg total) by mouth daily. 09/22/22 12/31/2022 Yes Zigmund Daniel., MD  hydrALAZINE (APRESOLINE) 50 MG tablet Take 1 tablet (50 mg total) by mouth 3 (three) times daily. 09/29/22 01/04/2023 Yes Leroy Sea, MD  insulin aspart (NOVOLOG) 100 UNIT/ML FlexPen Before each meal 3 times a day, 140-199 - 2 units, 200-250 - 4 units, 251-299 - 8 units,  300-349 - 10 units,  350 or above 12 units. I 09/29/22  Yes Leroy Sea, MD  insulin aspart (NOVOLOG) 100 UNIT/ML injection Inject 3 Units into the skin 3 (three) times daily with meals. Hold for NPO or consuming less than 50% of meals Patient taking differently: Inject 3 Units into the skin See admin instructions. Inject 3 units subcutaneously with meals. HOLD for NPO or consuming < 50% of meals. 09/22/22  Yes Zigmund Daniel., MD  insulin glargine (LANTUS) 100 UNIT/ML injection Inject 0.1 mLs (10 Units total) into the skin 2 (two) times daily. 09/29/22  Yes Leroy Sea, MD  levETIRAcetam (KEPPRA) 500 MG tablet Take 1 tablet (500 mg total) by mouth 2 (two) times daily. 10/06/22  Yes Marinda Elk, MD  mirtazapine (REMERON) 7.5 MG tablet Take 1 tablet (7.5 mg total) by mouth at bedtime. 09/22/22 01/23/2023 Yes Zigmund Daniel., MD  Multiple Vitamins-Minerals (MULTIVITAMIN WITH MINERALS) tablet Take 1 tablet by  mouth daily.   Yes [provider]  nicotine (NICODERM CQ - DOSED IN MG/24 HOURS) 14 mg/24hr patch Place 1 patch (14 mg total) onto the skin daily. 10/31/22  Yes Enedina Finner, MD  pantoprazole (PROTONIX) 40 MG tablet Take 1 tablet (40 mg total) by mouth daily. 09/22/22 12/29/2022 Yes Zigmund Daniel., MD  polyethylene glycol (MIRALAX / GLYCOLAX) 17 g packet Take 17 g by mouth daily. Titrate as needed for constipation. Patient taking differently: Take 17 g by mouth daily. 09/22/22  Yes Zigmund Daniel., MD  senna-docusate (SENOKOT-S) 8.6-50 MG tablet Take 1 tablet by mouth daily. 09/30/22  Yes Leroy Sea, MD  traZODone (DESYREL) 50 MG tablet Take 0.5 tablets (25 mg total) by mouth at bedtime as needed for sleep. 09/22/22 12/28/2022 Yes Zigmund Daniel., MD     Critical care time: 30 minutes    [address],  MD [facility] 01/20/2023 9:06 AM

## 2023-01-21 LAB — CULTURE, BLOOD (ROUTINE X 2): Culture: NO GROWTH

## 2023-01-21 MED ORDER — MIDAZOLAM HCL 2 MG/2ML IJ SOLN
2.0000 mg | INTRAMUSCULAR | Status: DC | PRN
  Administered 2023-01-21 (×3): 2 mg via INTRAVENOUS
  Filled 2023-01-21 (×3): qty 2

## 2023-01-22 LAB — CULTURE, BLOOD (ROUTINE X 2)

## 2023-01-24 NOTE — Death Summary Note (Signed)
DEATH SUMMARY   Patient Details  Name: Heather [name] MRN: [id number] DOB: [birth year]  Admission/Discharge Information   Admit Date:  06-Feb-2023  Date of Death: Date of Death: 02-14-2023  Time of Death: Time of Death: 0810  Length of Stay: 8  Referring Physician: Toy Cookey, FNP   Reason(s) for Hospitalization  Altered Mental Status   Diagnoses  Preliminary cause of death: Acute lower limb ischemia Secondary Diagnoses (including complications and co-morbidities):  Active Problems:   (HFpEF) heart failure with preserved ejection fraction (HCC)   Coronary artery disease   COPD (chronic obstructive pulmonary disease) (HCC)   Hypertension, benign   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 year old female e with medical history significant of HFpEF, CAD s/p DES to LAD 02/17/14), rheumatic heart disease s/p TAVR, T2DM, HTN, HLD, PAD s/p multiple stents and left AKA, COPD, who presents to the ED with c/o altered mental status.   Upon arrival to the ER pt with severe metabolic encephalopathy on presentation.  Pts husband reported a slow decline with acute worsening of mental status the day prior to presentation. Her right lower extremity had become paler over the last several weeks and he reported pt unable to move the right lower extremity. In the ER CT head with no acute intracranial abnormalities.  CXR concerning for pneumonia and pt ruled in for DKA.  She was started on iv abx  and insulin gtt. Pt required mechanical intubation due to encephalopathy along with acute respiratory failure.  She was admitted to ICU per PCCM team for additional workup and treatment.  Hospital course complicated by CTA Abd Pelvis on 2023-02-06 revealing occlusion from the level of the aorta down with bilateral iliac common femoral superficial/popliteal artery occlusions.  Vascular Surgery consulted and determined this was a nonreconstructable situation and unable to offer revascularization options.  Recommended comfort care.  On 01/17/23 pts family initially wanted to proceed with tracheostomy and PEG tube placement.  On 01/18/23 blood cultures positive for enterococcus VCM likely vanc resistant species and diptheroids felt to be contaminant. Pt started on daptomycin per ID recommendations. Following goals of care conversations pt transitioned to comfort measures only but remained on mechanical ventilation per family request.  On February 14, 2023 pt expired on mechanical ventilation at 08:10 am.  Pertinent Labs and Studies  Significant Diagnostic Studies DG Chest Port 1 View  Result Date: 01/17/2023 CLINICAL DATA:  Acute respiratory failure with hypoxia. EXAM: PORTABLE CHEST 1 VIEW COMPARISON:  Radiographs 02-06-2023 FINDINGS: The endotracheal tube tip is approximately 2.4 cm from the carina. Tip and side port of the enteric tube below the diaphragm in the stomach. Stable heart size and mediastinal contours, post TAVR. Aortic atherosclerosis. Improving vascular congestion. Improving basilar opacities. Small pleural effusions are suspected. No pneumothorax. IMPRESSION: 1. Improving vascular congestion.  Improving basilar opacities. 2. Possible small pleural effusions. 3. Stable support apparatus. Electronically Signed   By: Narda Rutherford M.D.   On: 01/17/2023 10:53   CT Angio Abd/Pel w/ and/or w/o  Result Date: 2023/02/06  CLINICAL DATA:  Weakness confusion EXAM: CTA ABDOMEN AND PELVIS WITHOUT AND WITH  CONTRAST TECHNIQUE: Multidetector CT imaging of the abdomen and pelvis was performed using the standard protocol during bolus administration of intravenous contrast. Multiplanar reconstructed images and MIPs were obtained and reviewed to evaluate the vascular anatomy. RADIATION DOSE REDUCTION: This exam was performed according to the departmental dose-optimization program which includes automated exposure control, adjustment of the mA and/or kV according to patient size and/or use of iterative reconstruction technique. CONTRAST:  OMNIPAQUE IOHEXOL 350 MG/ML SOLN COMPARISON:  CT 11/10/2020, 08/04/2022 FINDINGS: VASCULAR Aorta: Negative for aneurysm or dissection. Heavy atherosclerosis of the aorta. Celiac: Patent at the origin. Moderate severe disease of the celiac branches without aneurysm or occlusion. SMA: Stent in the proximal SMA. On sagittal views, there is patency of the stent. Mild poststenotic dilatation of the SMA after the stent with moderate calcification and mural disease but no occlusion or dissection. Renals: 2 right and single left renal arteries. Suspect at least mild stenosis at the proximal right renal artery. The left renal artery is patent. Inferior accessory renal artery appears patent but there is a possible web, coronal series 9, image 52. This finding is unchanged. No aneurysm or occlusion. IMA: Patent without evidence of aneurysm, dissection, vasculitis or significant stenosis. Inflow: Chronically occluded bilateral iliac stents. Interim placement of bilateral external iliac stents which are also occluded. The bilateral internal iliac vessels are mostly occluded with some flow distally. Proximal Outflow: Occluded appearing common femoral arteries. Partially visualized bilateral SFA stents also occluded. Occluded profunda Review of the MIP images confirms the above findings. NON-VASCULAR Lower chest: Lung bases demonstrate heterogeneous consolidations and ground-glass densities at the  right upper lobe inferiorly, the right middle lobe, and the bilateral lower lobes. No significant effusion. Aortic valve prosthesis. Heavily calcified coronary vessels. Hepatobiliary: Status post cholecystectomy. Intra and extrahepatic biliary dilatation stable to slightly increased. No focal hepatic abnormality. Pancreas: Unremarkable. No pancreatic ductal dilatation or surrounding inflammatory changes. Spleen: Normal in size without focal abnormality. Adrenals/Urinary Tract: Adrenal glands are normal. No hydronephrosis. The bladder is slightly thick walled with Foley catheter. Stomach/Bowel: Esophageal tube tip in the posterior fundus. Mild fluid distension of the stomach. No dilated small bowel. Negative appendix. No acute bowel wall thickening. Diverticular disease of the left colon. Lymphatic: No suspicious lymph nodes Reproductive: Negative for adnexal mass. Endometrial fluid or thickening measuring up to 10 mm with endometrial enhancement. Other: Negative for pelvic effusion or free air. Musculoskeletal: Chronic compression deformity at T11. Advanced degenerative changes of the lower lumbar spine. IMPRESSION: 1. Negative for acute aortic dissection or aneurysm. 2. Patent SMA stent but with suspicion of mild to moderate diffuse luminal stenosis within the stent. 3. Chronically occluded bilateral iliac stents. Interim placement of bilateral external iliac stents which are also occluded. Mostly occluded bilateral internal iliac vessels. Occluded bilateral common arteries. Partially visualized bilateral SFA stents which are also occluded. Aortic Atherosclerosis (ICD10-I70.0). NON-VASCULAR *Heterogeneous consolidations and ground-glass densities at the right upper lobe, right middle lobe, and bilateral lower lobes, suspicious for multifocal pneumonia potential atypical or viral pneumonia. *No CT evidence for acute intra-abdominal or pelvic abnormality. *Endometrial fluid or thickening measuring up to 10 mm with  endometrial enhancement. Recommend correlation with nonemergent pelvic ultrasound. *Diverticular disease of the colon without acute wall thickening. *Slightly thick-walled appearance of the urinary bladder which may be due to under distension or cystitis. *Aortic atherosclerosis. Electronically Signed   By: Jasmine Pang M.D.   On: 01/05/2023 21:53  DG Chest Portable 1 View  Result Date: 01/22/2023 CLINICAL DATA:  Intubated EXAM: PORTABLE CHEST 1 VIEW COMPARISON:  X-ray 01/03/2023 earlier FINDINGS: Enteric tube in place with tip extending beneath the diaphragm along the fundus of the stomach. ET tube with the tip just above the carina by proximally 2 cm. Status post TAVR. Presumed coronary stents. Stable cardiopericardial silhouette with increasing vascular congestion and bilateral lung base opacities, right-greater-than-left. Question tiny right effusion. No pneumothorax. Overlapping cardiac leads. IMPRESSION: New ET tube and enteric tube. Increasing lung base opacities, right-greater-than-left. Increasing vascular congestion. Recommend follow-up Electronically Signed   By: Karen Kays M.D.   On: 01/10/2023 18:57   CT Head Wo Contrast  Result Date: 01/04/2023 CLINICAL DATA:  Altered mental status EXAM: CT HEAD WITHOUT CONTRAST TECHNIQUE: Contiguous axial images were obtained from the base of the skull through the vertex without intravenous contrast. RADIATION DOSE REDUCTION: This exam was performed according to the departmental dose-optimization program which includes automated exposure control, adjustment of the mA and/or kV according to patient size and/or use of iterative reconstruction technique. COMPARISON:  None Available. FINDINGS: Brain: No evidence of acute infarction, hemorrhage, hydrocephalus, extra-axial collection or mass lesion/mass effect. 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 Vascular: No hyperdense vessel or  unexpected calcification. Skull: Normal. Negative for fracture or focal lesion. Sinuses/Orbits: No middle ear or mastoid effusion. Paranasal sinuses are clear. Bilateral lens replacement. Orbits are otherwise unremarkable. Other: None. IMPRESSION: 1. No acute intracranial abnormality. 2. 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 Electronically Signed   By: Lorenza Cambridge M.D.   On: 12/28/2022 09:47   DG Chest Port 1 View  Result Date: 01/19/2023 CLINICAL DATA:  Sepsis EXAM: PORTABLE CHEST 1 VIEW COMPARISON:  10/17/2022 FINDINGS: The heart size and mediastinal contours are within normal limits. Prior TAVR. Coronary artery stent. Aortic atherosclerosis. Increased interstitial markings bilaterally with small developing opacity in the right lower lobe. No pleural effusion or pneumothorax. The visualized skeletal structures are unremarkable. IMPRESSION: Increased interstitial markings bilaterally with small developing opacity in the right lower lobe. Findings suspicious for atypical/viral infection. Electronically Signed   By: Duanne Guess D.O.   On: 01/20/2023 09:04    Microbiology Recent Results (from the past 240 hour(s))  Blood Culture (routine x 2)     Status: Abnormal   Collection Time: 01/04/2023  8:40 AM   Specimen: BLOOD  Result Value Ref Range Status   Specimen Description   Final    BLOOD LEFT ANTECUBITAL Performed at [facility], [address], [place], [place] [address]    Special Requests   Final    BOTTLES DRAWN AEROBIC AND ANAEROBIC Blood Culture adequate volume Performed at [facility], [address], [place], [place] [address]    Culture  Setup Time   Final    [facility] POSITIVE COCCI ANAEROBIC BOTTLE ONLY A. CHAPPELL PHARMD, AT 1133 01/17/23 D. Leighton Roach Performed at [facility], [address] [place], [place] [address]    Culture (A)  Final    ENTEROCOCCUS FAECIUM ENTEROCOCCUS  FAECALIS VANCOMYCIN RESISTANT ENTEROCOCCUS STAPHYLOCOCCUS HAEMOLYTICUS STAPHYLOCOCCUS EPIDERMIDIS    Report Status 01/20/2023 FINAL  Final   Organism ID, Bacteria ENTEROCOCCUS FAECIUM  Final   Organism ID, Bacteria ENTEROCOCCUS FAECALIS  Final      Susceptibility   Enterococcus faecalis - MIC*    AMPICILLIN <=2 SENSITIVE Sensitive  VANCOMYCIN >=32 RESISTANT Resistant     GENTAMICIN SYNERGY SENSITIVE Sensitive     * ENTEROCOCCUS FAECALIS   Enterococcus faecium - MIC*    AMPICILLIN >=32 RESISTANT Resistant     VANCOMYCIN >=32 RESISTANT Resistant     GENTAMICIN SYNERGY SENSITIVE Sensitive     * ENTEROCOCCUS FAECIUM  Blood Culture (routine x 2)     Status: Abnormal   Collection Time: 01/14/2023  8:40 AM   Specimen: BLOOD LEFT HAND  Result Value Ref Range Status   Specimen Description   Final    BLOOD LEFT HAND Performed at [facility], [address], [place], [place] [address]    Special Requests   Final    BOTTLES DRAWN AEROBIC AND ANAEROBIC Blood Culture results may not be optimal due to an excessive volume of blood received in culture bottles Performed at [facility], [address], [place], [place] [address]    Culture  Setup Time   Final    GRAM POSITIVE RODS ANAEROBIC BOTTLE ONLY CRITICAL RESULT CALLED TO, READ BACK BY AND VERIFIED WITH: A. CHAPPELL PHARMD, AT 1010 01/17/23 D. VANHOOK    Culture (A)  Final    DIPHTHEROIDS(CORYNEBACTERIUM SPECIES) Standardized susceptibility testing for this organism is not available. Performed at [facility], [address] [place], [place] [address]    Report Status 01/19/2023 FINAL  Final  Blood Culture ID Panel (Reflexed)     Status: Abnormal   Collection Time: 01/06/2023  8:40 AM  Result Value Ref Range Status   Enterococcus faecalis DETECTED (A) NOT DETECTED Final    Comment: CRITICAL RESULT CALLED TO, READ BACK BY AND VERIFIED WITH: A. CHAPPELL PHARMD, AT 1133 01/17/23 D. VANHOOK    Enterococcus  Faecium DETECTED (A) NOT DETECTED Final    Comment: CRITICAL RESULT CALLED TO, READ BACK BY AND VERIFIED WITH: A. CHAPPELL PHARMD, AT 1133 01/17/23 D. VANHOOK    Listeria monocytogenes NOT DETECTED NOT DETECTED Final   Staphylococcus species DETECTED (A) NOT DETECTED Final    Comment: CRITICAL RESULT CALLED TO, READ BACK BY AND VERIFIED WITH: A. CHAPPELL PHARMD, AT 1133 01/17/23 D. VANHOOK    Staphylococcus aureus (BCID) NOT DETECTED NOT DETECTED Final   Staphylococcus epidermidis DETECTED (A) NOT DETECTED Final    Comment: Methicillin (oxacillin) resistant coagulase negative staphylococcus. Possible blood culture contaminant (unless isolated from more than one blood culture draw or clinical case suggests pathogenicity). No antibiotic treatment is indicated for blood  culture contaminants. CRITICAL RESULT CALLED TO, READ BACK BY AND VERIFIED WITH: A. CHAPPELL PHARMD, AT 1133 01/17/23 D. VANHOOK    Staphylococcus lugdunensis NOT DETECTED NOT DETECTED Final   Streptococcus species NOT DETECTED NOT DETECTED Final   Streptococcus agalactiae NOT DETECTED NOT DETECTED Final   Streptococcus pneumoniae NOT DETECTED NOT DETECTED Final   Streptococcus pyogenes NOT DETECTED NOT DETECTED Final   A.calcoaceticus-baumannii NOT DETECTED NOT DETECTED Final   Bacteroides fragilis NOT DETECTED NOT DETECTED Final   Enterobacterales NOT DETECTED NOT DETECTED Final   Enterobacter cloacae complex NOT DETECTED NOT DETECTED Final   Escherichia coli NOT DETECTED NOT DETECTED Final   Klebsiella aerogenes NOT DETECTED NOT DETECTED Final   Klebsiella oxytoca NOT DETECTED NOT DETECTED Final   Klebsiella pneumoniae NOT DETECTED NOT DETECTED Final   Proteus species NOT DETECTED NOT DETECTED Final   Salmonella species NOT DETECTED NOT DETECTED Final   Serratia marcescens NOT DETECTED NOT DETECTED Final   Haemophilus influenzae NOT DETECTED NOT DETECTED Final  Neisseria meningitidis NOT DETECTED NOT DETECTED Final    Pseudomonas aeruginosa NOT DETECTED NOT DETECTED Final   Stenotrophomonas maltophilia NOT DETECTED NOT DETECTED Final   Candida albicans NOT DETECTED NOT DETECTED Final   Candida auris NOT DETECTED NOT DETECTED Final   Candida glabrata NOT DETECTED NOT DETECTED Final   Candida krusei NOT DETECTED NOT DETECTED Final   Candida parapsilosis NOT DETECTED NOT DETECTED Final   Candida tropicalis NOT DETECTED NOT DETECTED Final   Cryptococcus neoformans/gattii NOT DETECTED NOT DETECTED Final   Methicillin resistance mecA/C DETECTED (A) NOT DETECTED Final    Comment: CRITICAL RESULT CALLED TO, READ BACK BY AND VERIFIED WITH: A. CHAPPELL PHARMD, AT 1133 01/17/23 D. VANHOOK    Vancomycin resistance DETECTED (A) NOT DETECTED Final    Comment: CRITICAL RESULT CALLED TO, READ BACK BY AND VERIFIED WITH: A. CHAPPELL PHARMD, AT 1133 01/17/23 D. Leighton Roach Performed at [facility], [address] [place], [place] [address]   SARS Coronavirus 2 by RT PCR (hospital order, performed in [facility] hospital lab) *cepheid single result test* Anterior Nasal Swab     Status: None   Collection Time: 12/24/2022  1:05 PM   Specimen: Anterior Nasal Swab  Result Value Ref Range Status   SARS Coronavirus 2 by RT PCR NEGATIVE NEGATIVE Final    Comment: (NOTE) SARS-CoV-2 target nucleic acids are NOT DETECTED.  The SARS-CoV-2 RNA is generally detectable in upper and lower respiratory specimens during the acute phase of infection. The lowest concentration of SARS-CoV-2 viral copies this assay can detect is 250 copies / mL. A negative result does not preclude SARS-CoV-2 infection and should not be used as the sole basis for treatment or other patient management decisions.  A negative result may occur with improper specimen collection / handling, submission of specimen other than nasopharyngeal swab, presence of viral mutation(s) within the areas targeted by this assay, and inadequate number of viral copies (<250  copies / mL). A negative result must be combined with clinical observations, patient history, and epidemiological information.  Fact Sheet for Patients:   RoadLapTop.co.za  Fact Sheet for Healthcare Providers: http://kim-miller.com/  This test is not yet approved or  cleared by the [place] FDA and has been authorized for detection and/or diagnosis of SARS-CoV-2 by FDA under an Emergency Use Authorization (EUA).  This EUA will remain in effect (meaning this test can be used) for the duration of the COVID-19 declaration under Section 564(b)(1) of the Act, 21 U.S.C. section 360bbb-3(b)(1), unless the authorization is terminated or revoked sooner.  Performed at [facility], [address] [place], [place] [address]   MRSA Next Gen by PCR, Nasal     Status: None   Collection Time: 01/19/2023 11:50 PM   Specimen: Nasal Mucosa; Nasal Swab  Result Value Ref Range Status   MRSA by PCR Next Gen NOT DETECTED NOT DETECTED Final    Comment: (NOTE) The GeneXpert MRSA Assay (FDA approved for NASAL specimens only), is one component of a comprehensive MRSA colonization surveillance program. It is not intended to diagnose MRSA infection nor to guide or monitor treatment for MRSA infections. Test performance is not FDA approved in patients less than 5 years old. Performed at [facility], [address] [place], [place] [address]   Culture, blood (Routine X 2) w Reflex to ID Panel     Status: None (Preliminary result)   Collection Time: 01/17/23  1:37 PM   Specimen: BLOOD  Result Value Ref  Range Status   Specimen Description BLOOD BLOOD LEFT HAND  Final   Special Requests   Final    BOTTLES DRAWN AEROBIC AND ANAEROBIC Blood Culture adequate volume   Culture   Final    NO GROWTH 4 DAYS Performed at [facility], [address] [place], [place] [address]    Report Status PENDING  Incomplete  Culture, blood  (Routine X 2) w Reflex to ID Panel     Status: None (Preliminary result)   Collection Time: 01/17/23  1:37 PM   Specimen: BLOOD  Result Value Ref Range Status   Specimen Description BLOOD LEFT WRIST  Final   Special Requests   Final    BOTTLES DRAWN AEROBIC AND ANAEROBIC Blood Culture adequate volume   Culture   Final    NO GROWTH 4 DAYS Performed at [facility], [address] [place], [place] [address]    Report Status PENDING  Incomplete    Lab Basic Metabolic Panel: Recent Labs  Lab 01/14/23 1440 01/15/23 0657 01/16/23 0935 01/16/23 1242 01/16/23 1630 01/17/23 1133 01/17/23 1639 01/18/23 0538  NA 134* 132* 132*  --   --  129*  --  131*  K 3.5 3.9 4.5  --   --  4.3  --  3.6  CL 101 103 106  --   --  107  --  108  CO2 27 24 21*  --   --  16*  --  19*  GLUCOSE 70 101* 130*  --   --  170*  --  159*  BUN 10 10 9   --   --  12  --  13  CREATININE 0.43* 0.44 0.53  --   --  0.54  --  0.43*  CALCIUM 6.6* 6.9* 7.3*  --   --  6.8*  --  7.1*  MG  --  1.7 1.9  --   --   --   --   --   PHOS  --   --   --  2.4* 2.6 2.5 2.5  --    Liver Function Tests: No results for input(s): "AST", "ALT", "ALKPHOS", "BILITOT", "PROT", "ALBUMIN" in the last 168 hours. No results for input(s): "LIPASE", "AMYLASE" in the last 168 hours. No results for input(s): "AMMONIA" in the last 168 hours. CBC: Recent Labs  Lab 01/16/23 0935 01/17/23 1133 01/18/23 0538  WBC 13.5* 11.9* 8.3  HGB 10.9* 9.5* 8.8*  HCT 33.4* 30.0* 27.8*  MCV 85.2 87.7 87.1  PLT 160 PLATELET CLUMPS NOTED ON SMEAR, UNABLE TO ESTIMATE 148*   Cardiac Enzymes: Recent Labs  Lab 01/17/23 1133 01/18/23 0528  CKTOTAL 5,830* 4,375*   Sepsis Labs: Recent Labs  Lab 01/16/23 0935 01/17/23 1133 01/18/23 0538  WBC 13.5* 11.9* 8.3    Procedures/Operations  Mechanical Intubation   Zada Girt, AGNP  Pulmonary/Critical Care Pager [phone] (please enter 7 digits) PCCM Consult Pager [phone] (please enter  7 digits)

## 2023-01-24 NOTE — Progress Notes (Signed)
1610 RN noted change in patient HR/rhythm. RN attempted to call spouse to notify  change in patient condition. Unable to reach. RN called daughter, unable to reach.  [phone] Patient TOD 0810, RN in room.   [id number] Daughter returned call to RN. Notified of patient TOD. Daughter to come to bedside. RN tried to call spouse, no answer.

## 2023-01-24 DEATH — deceased

## 2023-12-25 IMAGING — CR DG FOOT COMPLETE 3+V*L*
3 series · 3 of 3 positions shown · non-contrast
Comparison: None Available.

CLINICAL DATA: Pain, swelling and ecchymosis for 4 days.

EXAM:
LEFT FOOT - COMPLETE 3+ VIEW

[foot ap]
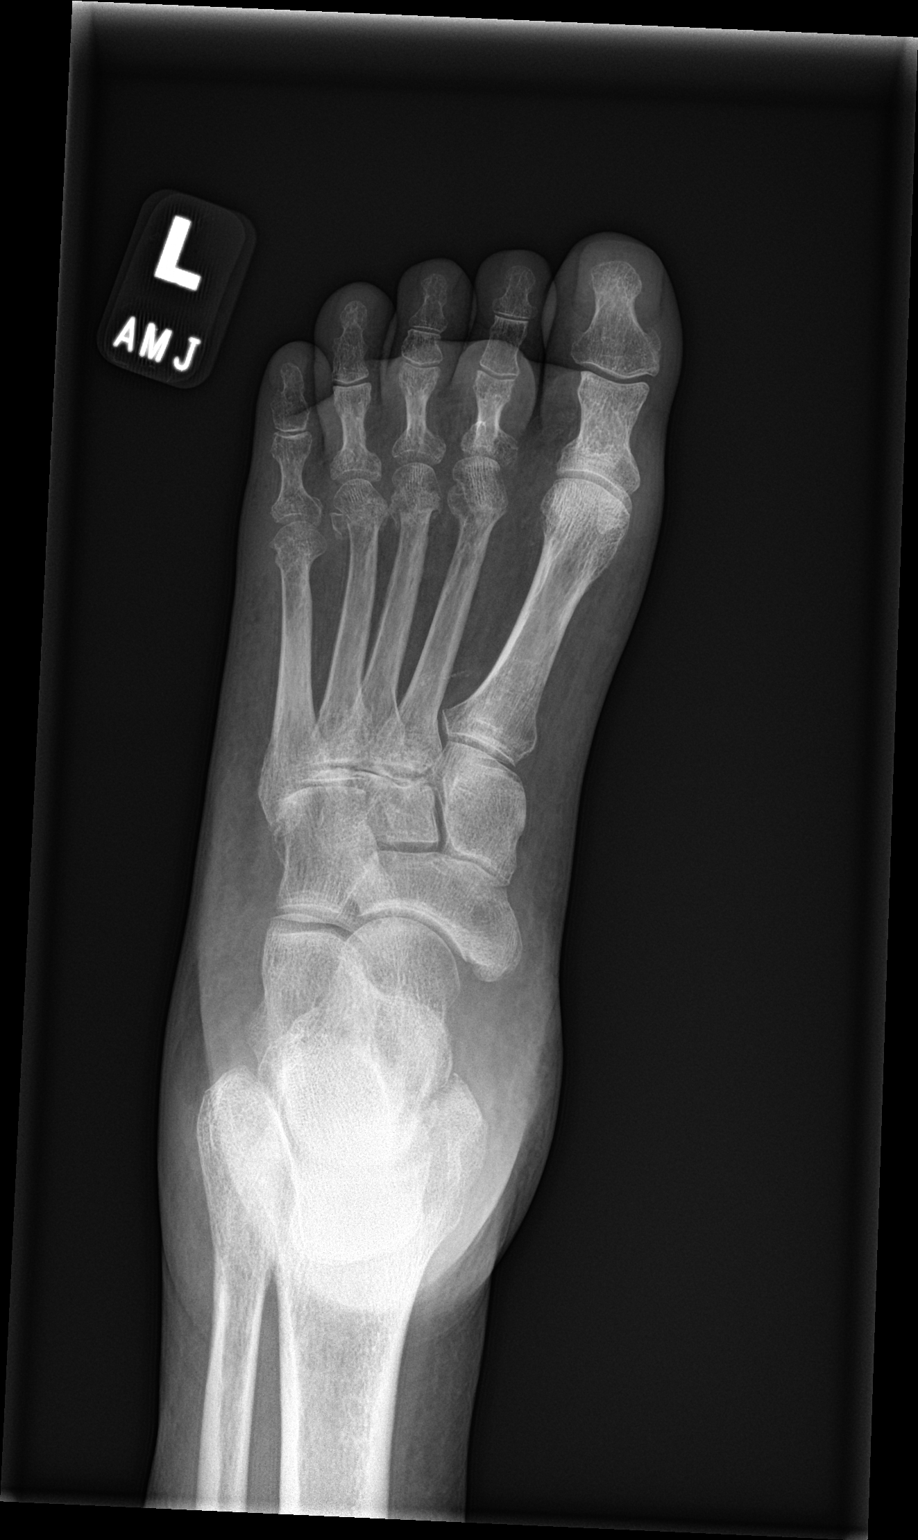

[foot obl]
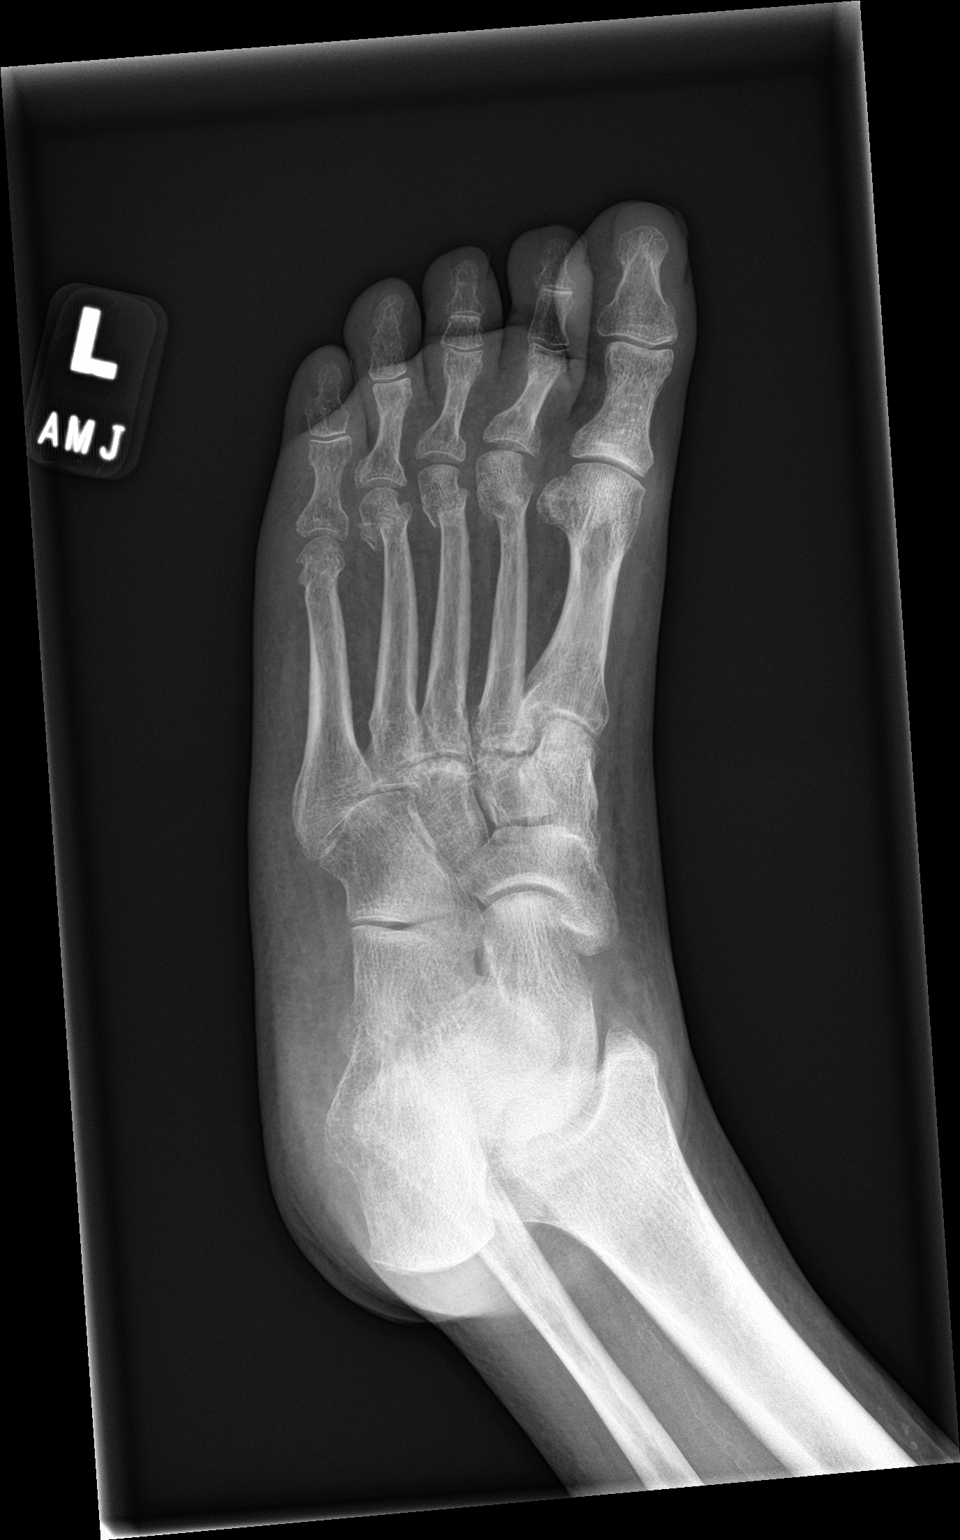

[foot lat]
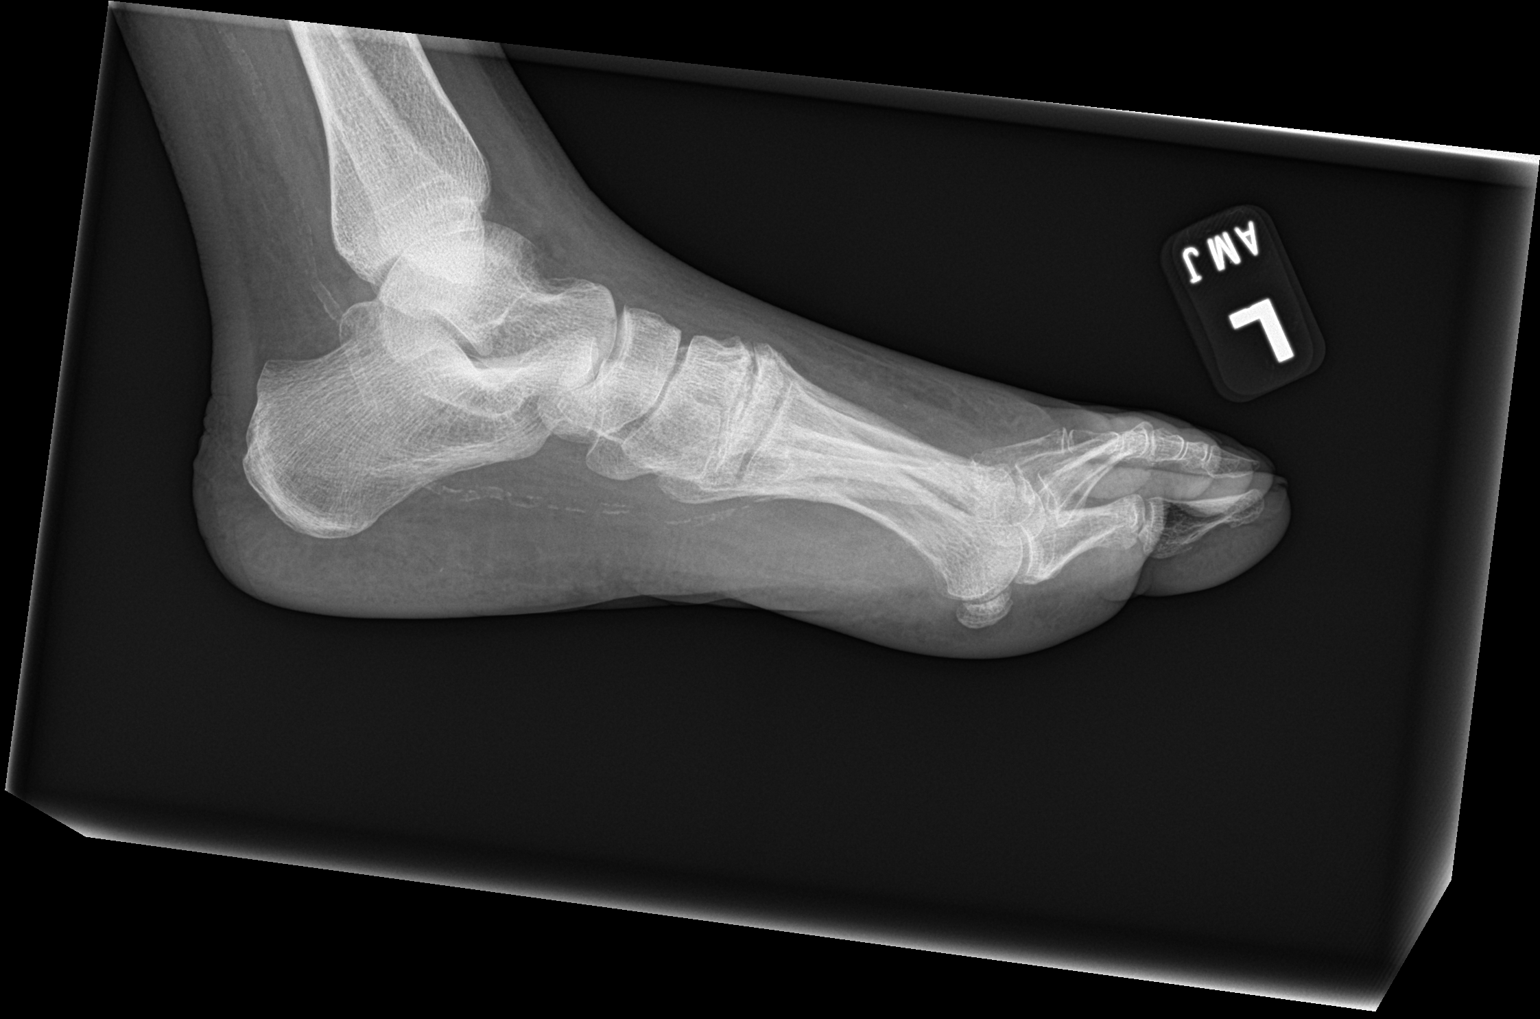

[3 of 3 positions shown; findings below may reference images not displayed]

FINDINGS: There is mild lateral cortical step-off at the third and fourth
metatarsal necks with acute fractures and minimal 2 mm lateral
displacement of the distal fracture component with respect to the
proximal fracture component. Likely additional second metatarsal
neck acute fracture with less, about 1 mm cortical step-off/minimal
lateral displacement. Mild-to-moderate dorsal tarsometatarsal
degenerative osteophytosis on lateral view. No dislocation. Mild
forefoot soft tissue swelling. Vascular calcifications are noted.
IMPRESSION: Acute minimally displaced fractures of the second through fourth
metatarsals with minimal lateral displacement of the distal fracture
component within the fourth greater than third metatarsals.
